# Patient Record
Sex: Female | Born: 1960
Health system: Southern US, Community
[De-identification: ages and names within clinical notes are randomized; demographics above are authoritative.]

## PROBLEM LIST (undated history)

## (undated) DIAGNOSIS — S129XXA Fracture of neck, unspecified, initial encounter: Secondary | ICD-10-CM

## (undated) DIAGNOSIS — G709 Myoneural disorder, unspecified: Secondary | ICD-10-CM

## (undated) DIAGNOSIS — B009 Herpesviral infection, unspecified: Secondary | ICD-10-CM

## (undated) DIAGNOSIS — R55 Syncope and collapse: Secondary | ICD-10-CM

## (undated) DIAGNOSIS — Z8701 Personal history of pneumonia (recurrent): Secondary | ICD-10-CM

## (undated) DIAGNOSIS — F419 Anxiety disorder, unspecified: Secondary | ICD-10-CM

## (undated) DIAGNOSIS — IMO0001 Reserved for inherently not codable concepts without codable children: Secondary | ICD-10-CM

## (undated) DIAGNOSIS — R413 Other amnesia: Secondary | ICD-10-CM

## (undated) DIAGNOSIS — E039 Hypothyroidism, unspecified: Secondary | ICD-10-CM

## (undated) DIAGNOSIS — M1990 Unspecified osteoarthritis, unspecified site: Secondary | ICD-10-CM

## (undated) DIAGNOSIS — M5030 Other cervical disc degeneration, unspecified cervical region: Secondary | ICD-10-CM

## (undated) DIAGNOSIS — T8859XA Other complications of anesthesia, initial encounter: Secondary | ICD-10-CM

## (undated) DIAGNOSIS — F329 Major depressive disorder, single episode, unspecified: Secondary | ICD-10-CM

## (undated) DIAGNOSIS — R296 Repeated falls: Secondary | ICD-10-CM

## (undated) DIAGNOSIS — Z9889 Other specified postprocedural states: Secondary | ICD-10-CM

## (undated) DIAGNOSIS — E785 Hyperlipidemia, unspecified: Secondary | ICD-10-CM

## (undated) DIAGNOSIS — T884XXA Failed or difficult intubation, initial encounter: Secondary | ICD-10-CM

## (undated) DIAGNOSIS — R112 Nausea with vomiting, unspecified: Secondary | ICD-10-CM

## (undated) DIAGNOSIS — K219 Gastro-esophageal reflux disease without esophagitis: Secondary | ICD-10-CM

## (undated) DIAGNOSIS — M5481 Occipital neuralgia: Secondary | ICD-10-CM

## (undated) DIAGNOSIS — I10 Essential (primary) hypertension: Secondary | ICD-10-CM

## (undated) DIAGNOSIS — R0600 Dyspnea, unspecified: Secondary | ICD-10-CM

## (undated) DIAGNOSIS — Q761 Klippel-Feil syndrome: Secondary | ICD-10-CM

## (undated) DIAGNOSIS — M75120 Complete rotator cuff tear or rupture of unspecified shoulder, not specified as traumatic: Secondary | ICD-10-CM

## (undated) DIAGNOSIS — F32A Depression, unspecified: Secondary | ICD-10-CM

## (undated) DIAGNOSIS — J939 Pneumothorax, unspecified: Secondary | ICD-10-CM

## (undated) DIAGNOSIS — J45909 Unspecified asthma, uncomplicated: Secondary | ICD-10-CM

## (undated) DIAGNOSIS — J111 Influenza due to unidentified influenza virus with other respiratory manifestations: Secondary | ICD-10-CM

## (undated) DIAGNOSIS — G930 Cerebral cysts: Secondary | ICD-10-CM

## (undated) DIAGNOSIS — R51 Headache: Secondary | ICD-10-CM

## (undated) DIAGNOSIS — M19019 Primary osteoarthritis, unspecified shoulder: Secondary | ICD-10-CM

## (undated) DIAGNOSIS — Z87442 Personal history of urinary calculi: Secondary | ICD-10-CM

## (undated) DIAGNOSIS — R2689 Other abnormalities of gait and mobility: Secondary | ICD-10-CM

## (undated) DIAGNOSIS — R519 Headache, unspecified: Secondary | ICD-10-CM

## (undated) DIAGNOSIS — G5603 Carpal tunnel syndrome, bilateral upper limbs: Secondary | ICD-10-CM

## (undated) DIAGNOSIS — R350 Frequency of micturition: Secondary | ICD-10-CM

## (undated) HISTORY — DX: Gastro-esophageal reflux disease without esophagitis: K21.9

## (undated) HISTORY — PX: FOREIGN BODY REMOVAL: SHX962

## (undated) HISTORY — PX: COLONOSCOPY: SHX174

## (undated) HISTORY — PX: ABDOMINAL HYSTERECTOMY: SHX81

## (undated) HISTORY — PX: ROTATOR CUFF REPAIR: SHX139

## (undated) HISTORY — PX: KNEE ARTHROSCOPY: SUR90

## (undated) HISTORY — PX: REDUCTION MAMMAPLASTY: SUR839

---

## 2001-02-28 ENCOUNTER — Encounter: Payer: Self-pay | Admitting: Internal Medicine

## 2001-02-28 ENCOUNTER — Ambulatory Visit (HOSPITAL_COMMUNITY): Admission: RE | Admit: 2001-02-28 | Discharge: 2001-02-28 | Payer: Self-pay | Admitting: Internal Medicine

## 2001-07-02 ENCOUNTER — Encounter: Payer: Self-pay | Admitting: Emergency Medicine

## 2001-07-02 ENCOUNTER — Emergency Department (HOSPITAL_COMMUNITY): Admission: EM | Admit: 2001-07-02 | Discharge: 2001-07-02 | Payer: Self-pay | Admitting: Emergency Medicine

## 2001-11-13 ENCOUNTER — Encounter: Payer: Self-pay | Admitting: Family Medicine

## 2001-11-13 ENCOUNTER — Ambulatory Visit (HOSPITAL_COMMUNITY): Admission: RE | Admit: 2001-11-13 | Discharge: 2001-11-13 | Payer: Self-pay | Admitting: Family Medicine

## 2001-12-11 ENCOUNTER — Encounter: Payer: Self-pay | Admitting: Family Medicine

## 2001-12-11 ENCOUNTER — Ambulatory Visit (HOSPITAL_COMMUNITY): Admission: RE | Admit: 2001-12-11 | Discharge: 2001-12-11 | Payer: Self-pay | Admitting: Family Medicine

## 2001-12-16 ENCOUNTER — Encounter: Payer: Self-pay | Admitting: Internal Medicine

## 2001-12-16 ENCOUNTER — Ambulatory Visit (HOSPITAL_COMMUNITY): Admission: RE | Admit: 2001-12-16 | Discharge: 2001-12-16 | Payer: Self-pay | Admitting: Internal Medicine

## 2003-05-13 ENCOUNTER — Encounter: Payer: Self-pay | Admitting: *Deleted

## 2003-05-13 ENCOUNTER — Emergency Department (HOSPITAL_COMMUNITY): Admission: EM | Admit: 2003-05-13 | Discharge: 2003-05-13 | Payer: Self-pay | Admitting: *Deleted

## 2003-05-24 ENCOUNTER — Encounter: Payer: Self-pay | Admitting: Internal Medicine

## 2003-05-24 ENCOUNTER — Ambulatory Visit (HOSPITAL_COMMUNITY): Admission: RE | Admit: 2003-05-24 | Discharge: 2003-05-24 | Payer: Self-pay | Admitting: Internal Medicine

## 2003-05-28 ENCOUNTER — Ambulatory Visit (HOSPITAL_COMMUNITY): Admission: RE | Admit: 2003-05-28 | Discharge: 2003-05-28 | Payer: Self-pay | Admitting: Internal Medicine

## 2003-06-08 ENCOUNTER — Ambulatory Visit (HOSPITAL_COMMUNITY): Admission: RE | Admit: 2003-06-08 | Discharge: 2003-06-08 | Payer: Self-pay | Admitting: Internal Medicine

## 2003-10-07 ENCOUNTER — Ambulatory Visit (HOSPITAL_COMMUNITY): Admission: RE | Admit: 2003-10-07 | Discharge: 2003-10-07 | Payer: Self-pay | Admitting: Internal Medicine

## 2004-02-06 ENCOUNTER — Emergency Department (HOSPITAL_COMMUNITY): Admission: EM | Admit: 2004-02-06 | Discharge: 2004-02-06 | Payer: Self-pay | Admitting: Emergency Medicine

## 2004-05-03 IMAGING — CT CT HEAD W/O CM
1 series · 16 of 26 positions shown, 20 images · non-contrast
Comparison: none

FINDINGS
CLINICAL DATA: SYNCOPE, HYPERTENSION.
CT HEAD WITHOUT CONTRAST
COMPARISON TO 07/02/01.
NO EVIDENCE OF ACUTE INTRACRANIAL ABNORMALITY.  NO MASS OR MASS EFFECT, HYDROCEPHALUS, EXTRA-AXIAL
FLUID COLLECTION, MIDLINE SHIFT, HEMORRHAGE, INFRACT.  FOCAL AREA OF DECREASED ATTENUATION IN THE
REGION OF THE LEFT BASAL GANGLIA MAY REPRESENT ENLARGED PERIVASCULAR SPACE/CYST.  BONY CALVARIUM
AND VISUALIZED PARANASAL SINUSES GROSSLY UNREMARKABLE.
IMPRESSION
NO EVIDENCE OF ACUTE INTRACRANIAL ABNORMALITY.

[Series 5333: — · axial · 0.40mm/px · z∈[-670,-555]mm · 16 of 26 slices shown, 20 images]
[im 2/26  brain]
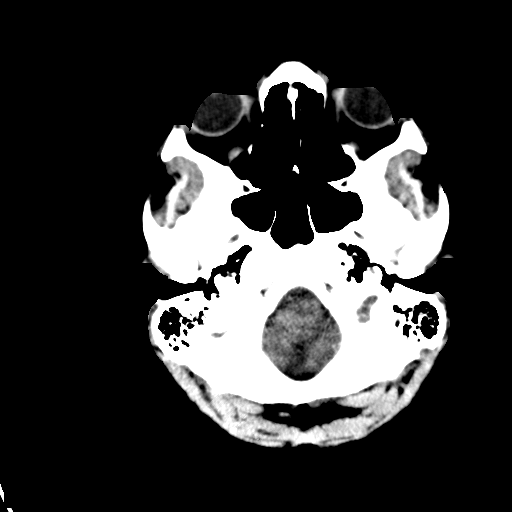
[im 2/26  bone]
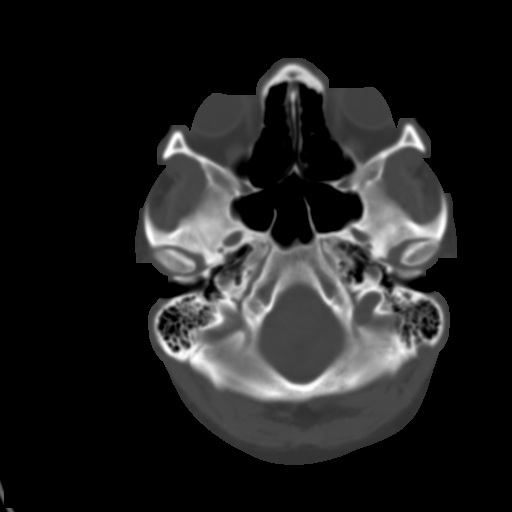
[im 4/26  brain]
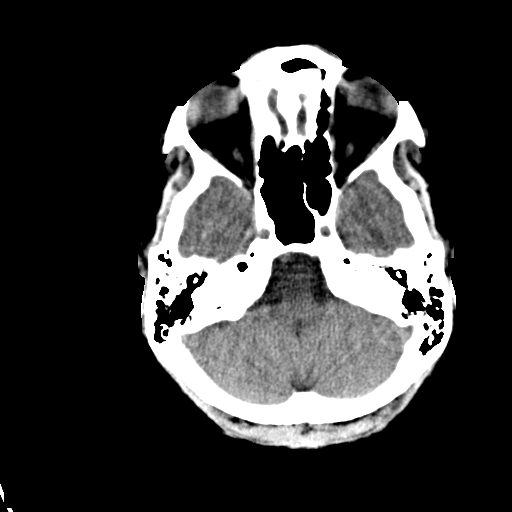
[im 5/26  brain]
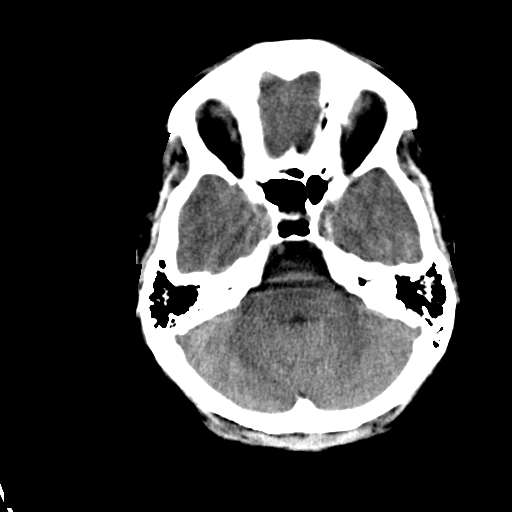
[im 7/26  brain]
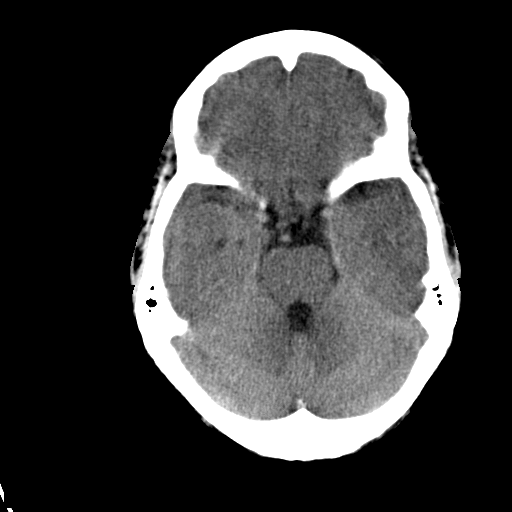
[im 8/26  brain]
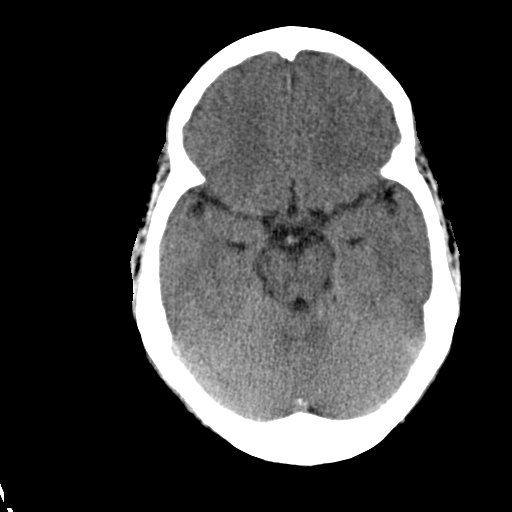
[im 8/26  bone]
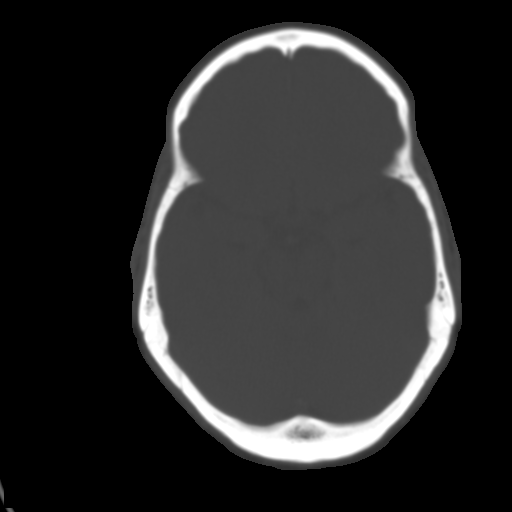
[im 10/26  brain]
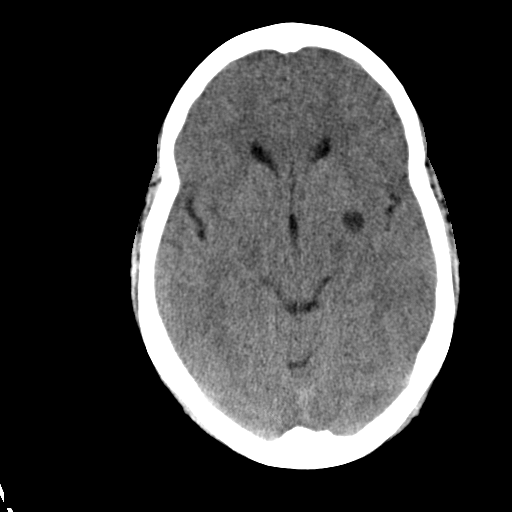
[im 11/26  brain]
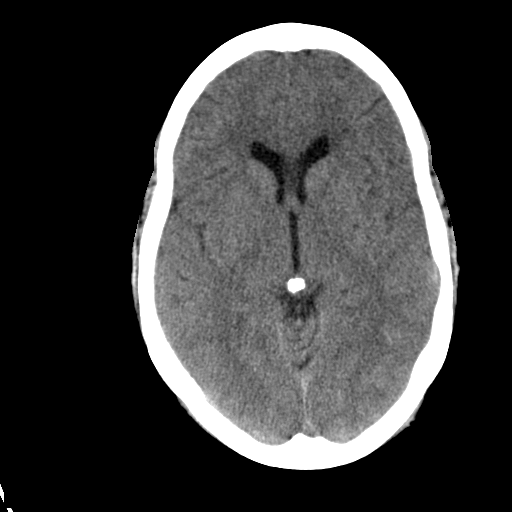
[im 13/26  brain]
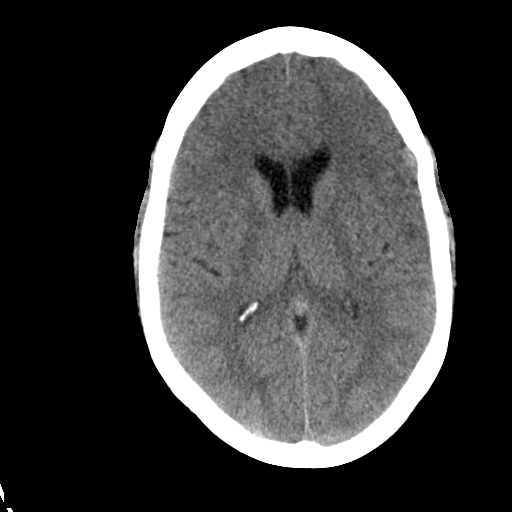
[im 14/26  brain]
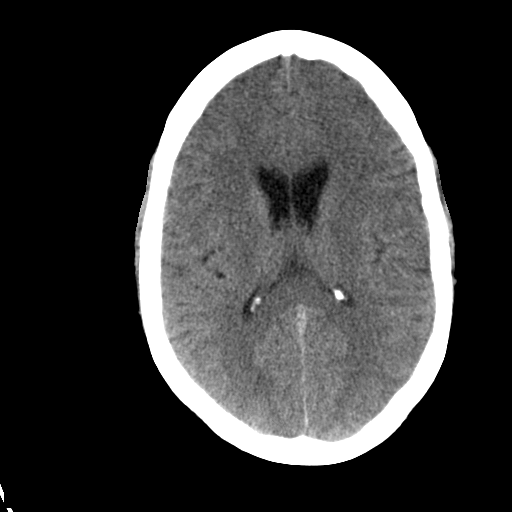
[im 14/26  bone]
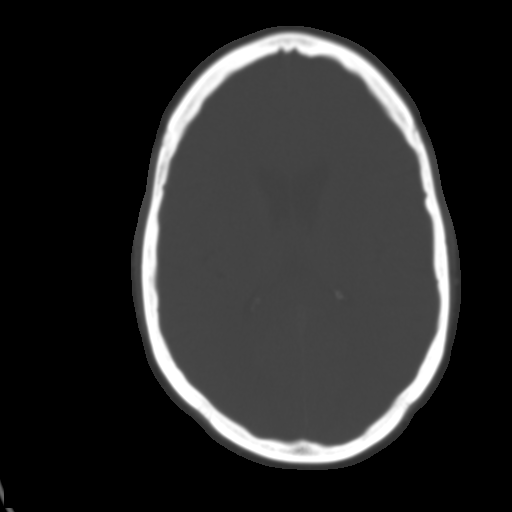
[im 16/26  brain]
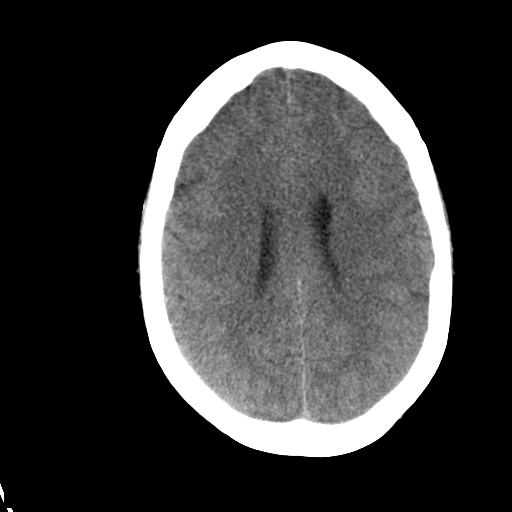
[im 17/26  brain]
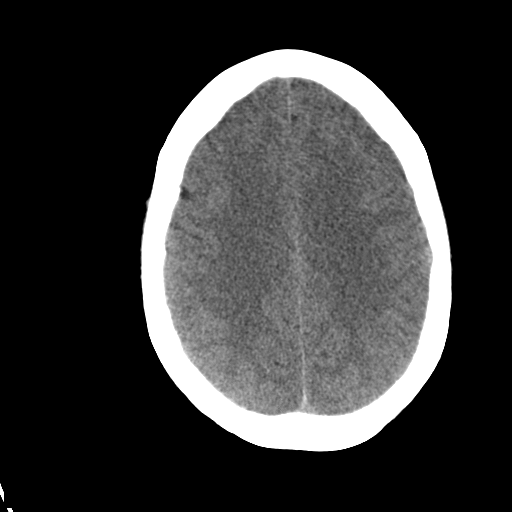
[im 19/26  brain]
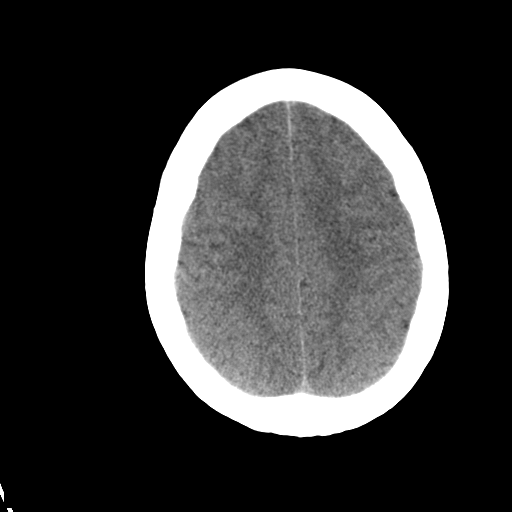
[im 20/26  brain]
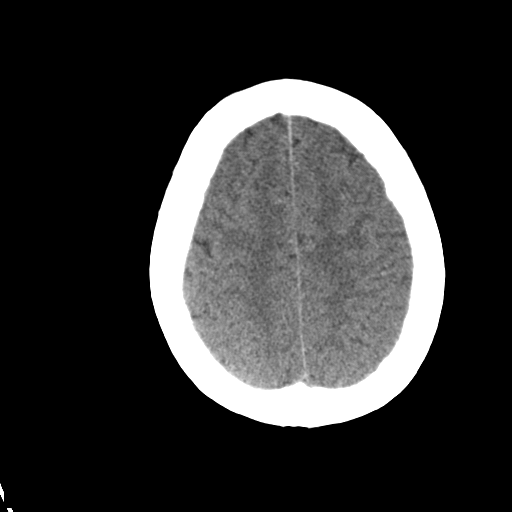
[im 20/26  bone]
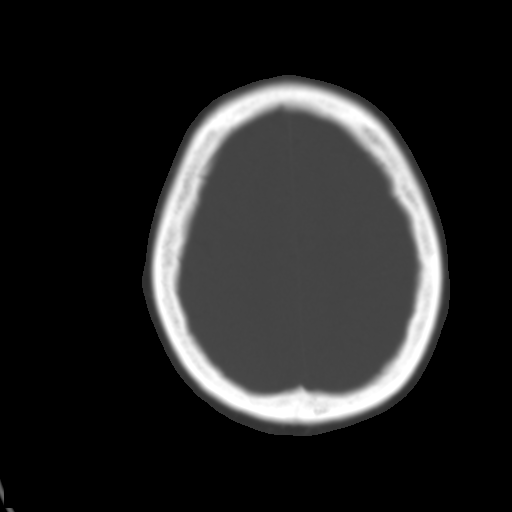
[im 22/26  brain]
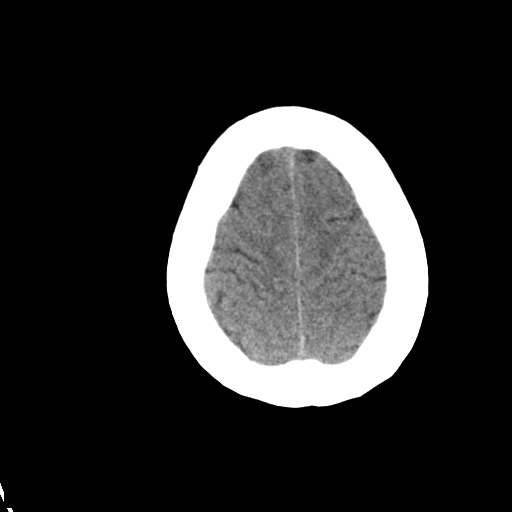
[im 23/26  brain]
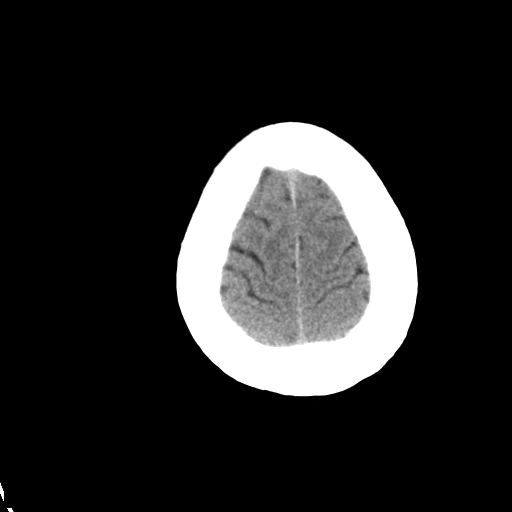
[im 25/26  brain]
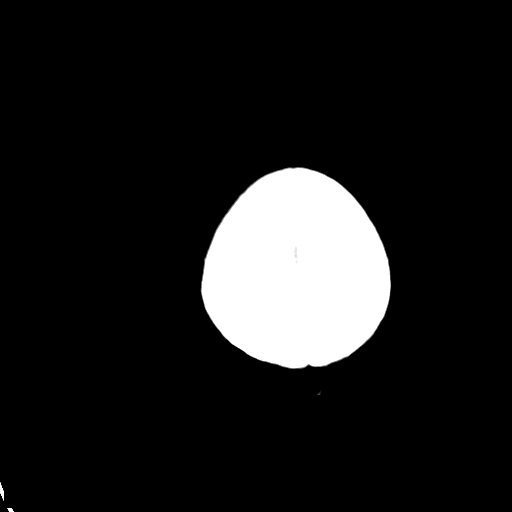

[16 of 26 positions shown; findings below may reference images not displayed]

## 2005-04-30 ENCOUNTER — Emergency Department (HOSPITAL_COMMUNITY): Admission: EM | Admit: 2005-04-30 | Discharge: 2005-04-30 | Payer: Self-pay | Admitting: Emergency Medicine

## 2005-05-20 ENCOUNTER — Emergency Department (HOSPITAL_COMMUNITY): Admission: EM | Admit: 2005-05-20 | Discharge: 2005-05-20 | Payer: Self-pay | Admitting: Emergency Medicine

## 2005-10-13 IMAGING — US US PELVIS COMPLETE MODIFY
1 series · 12 of 12 positions shown · non-contrast
Comparison: none

CLINICAL DATA: Lt ovarian cyst seen on recent CT dated 05/23/01.
COMPLETE PELVIC AND TRANSVAGINAL ULTRASOUND
Uterus is not visualized consistent with patient?s hysterectomy.  The right ovary is not visualized.  There is a 4.4 x 2.2 x 2.7 cm mildly complex cyst with single mildly thickened septation, in the left adnexal region.  No definite mural nodularity with this cyst is noted.  No free fluid.  
IMPRESSION
1.  4.4 x 2.2 x 2.7 cm mildly complex left  dnexal cyst with single mildly thickened septation.  Consider interval follow-up in 6-8 weeks to assess change.
2.  Uterus and right ovary not visualized.

[Series 1: unknown · 0.32mm/px · 12 of 12 slices shown]
[im 1/12]
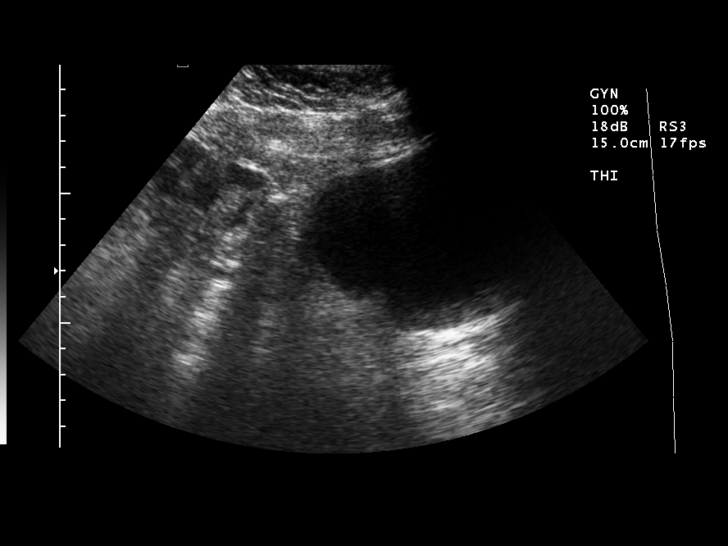
[im 2/12]
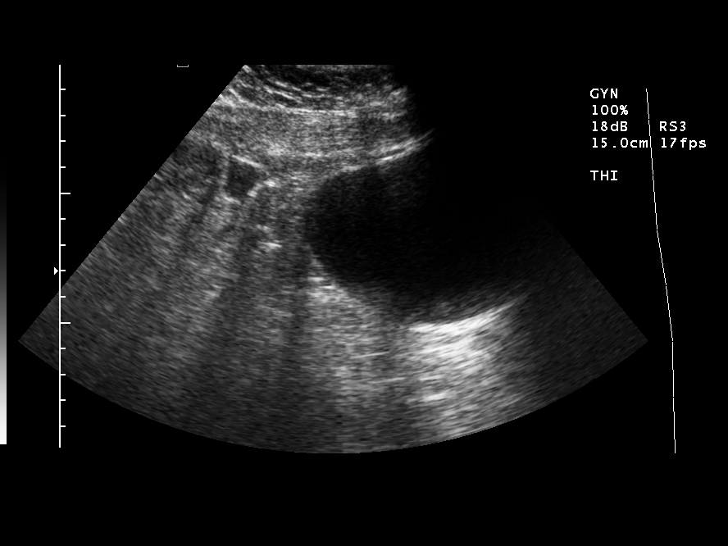
[im 3/12]
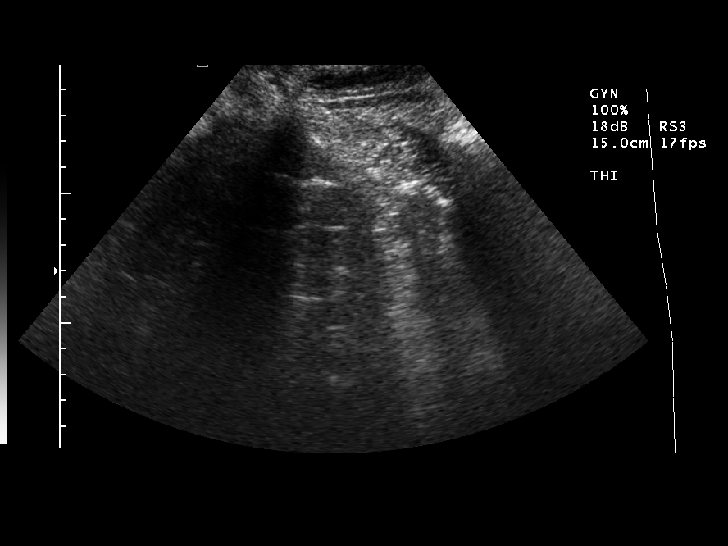
[im 4/12]
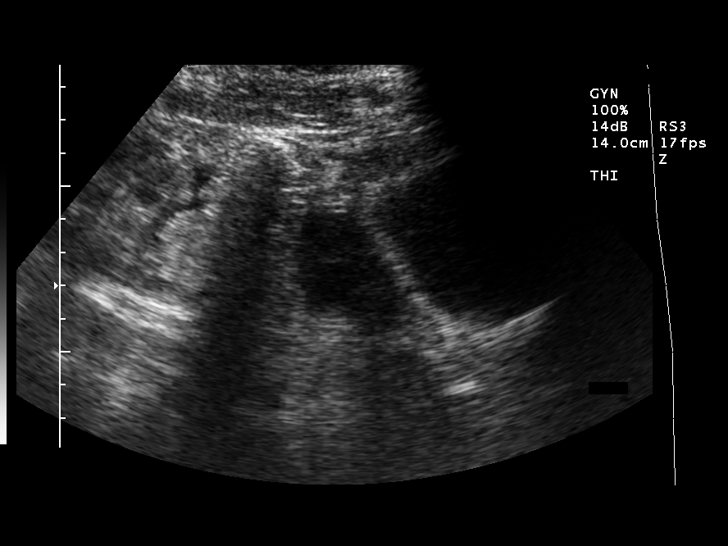
[im 5/12]
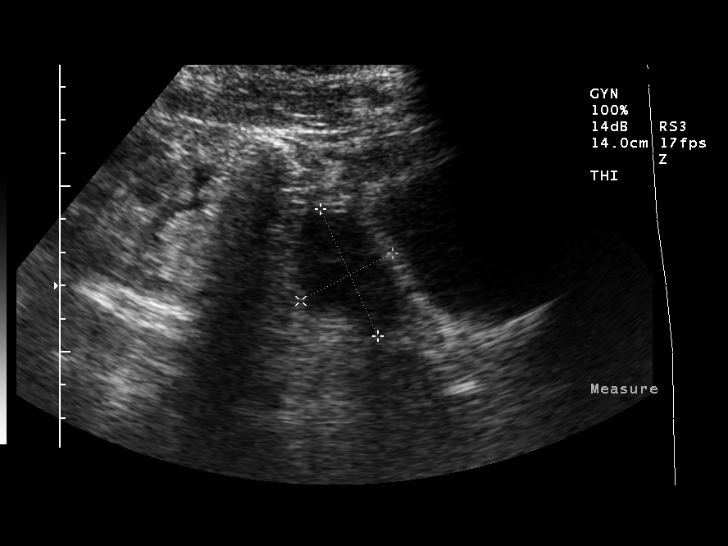
[im 6/12]
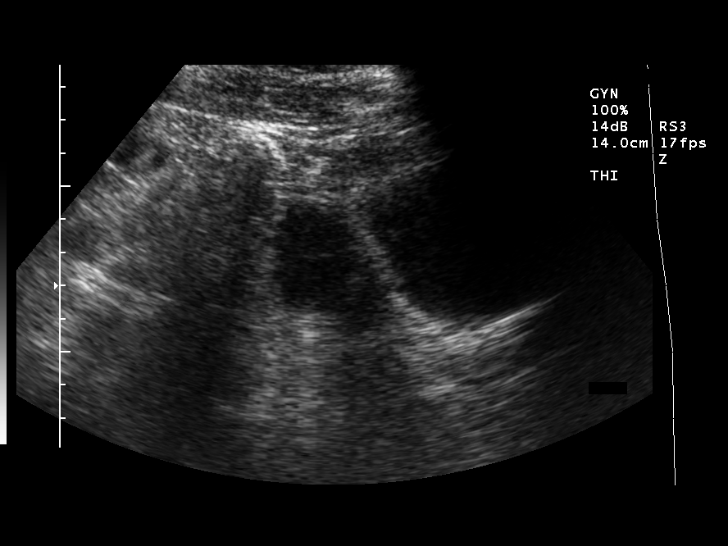
[im 7/12]
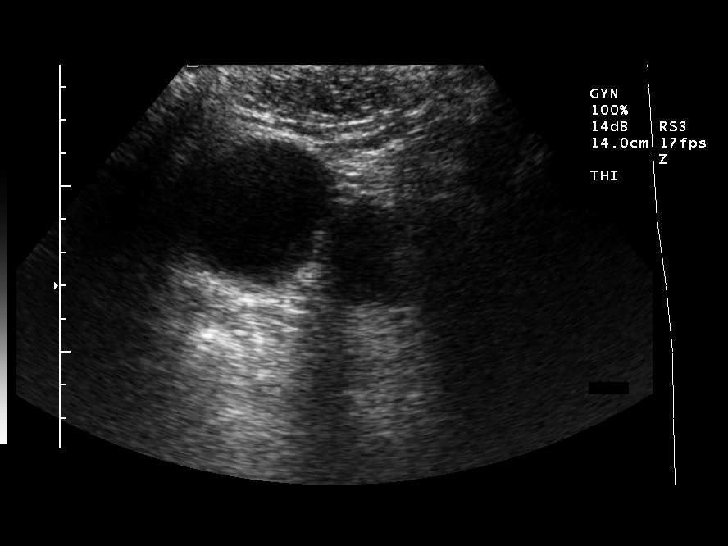
[im 8/12]
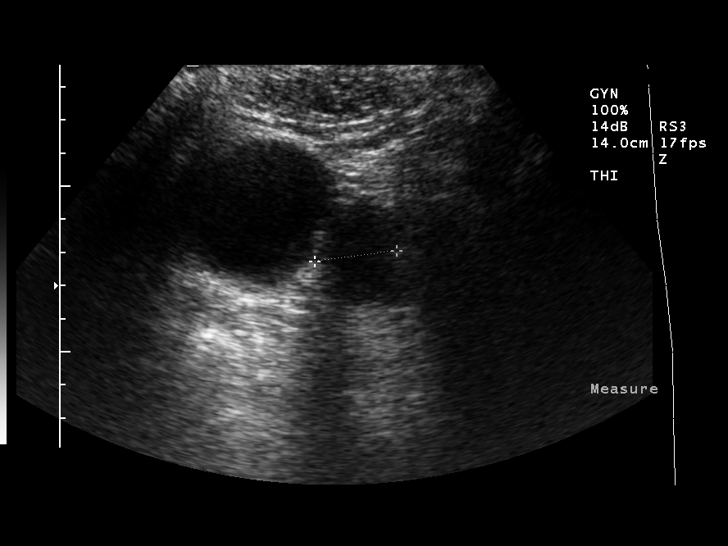
[im 9/12]
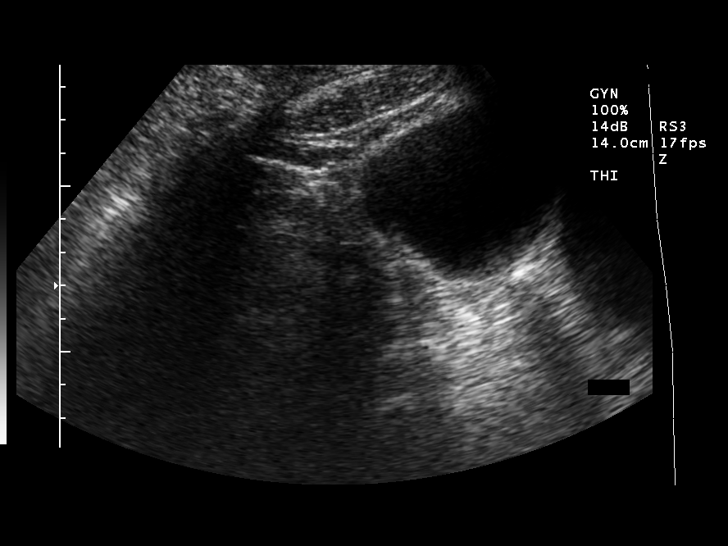
[im 10/12]
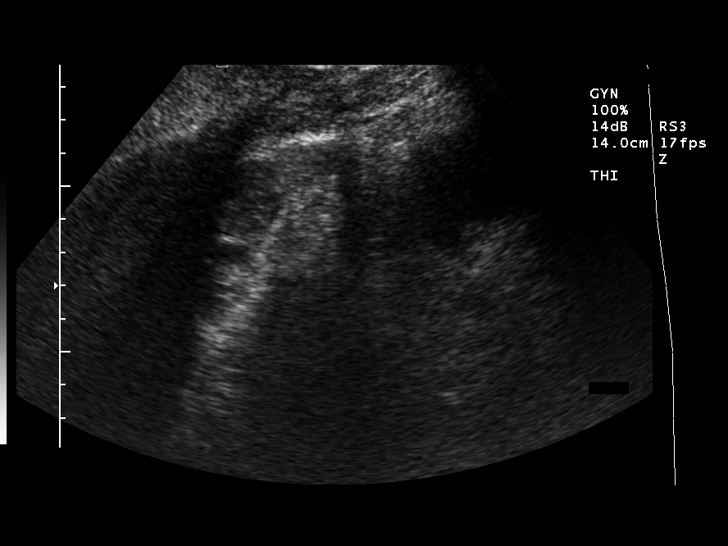
[im 11/12]
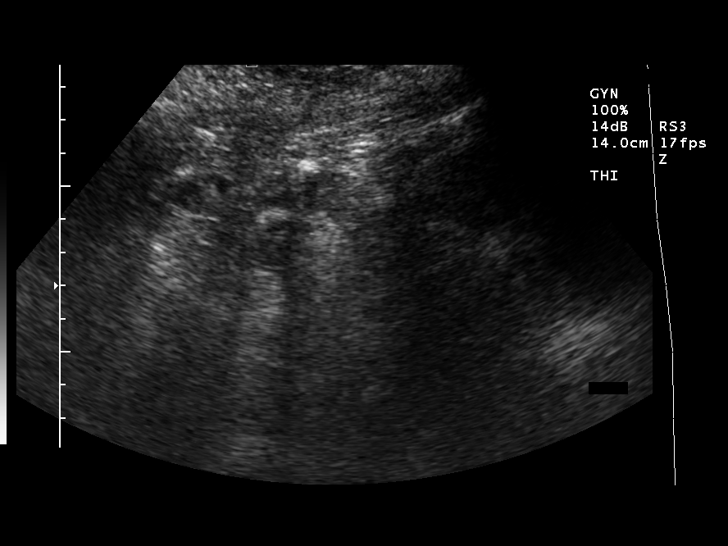
[im 12/12]
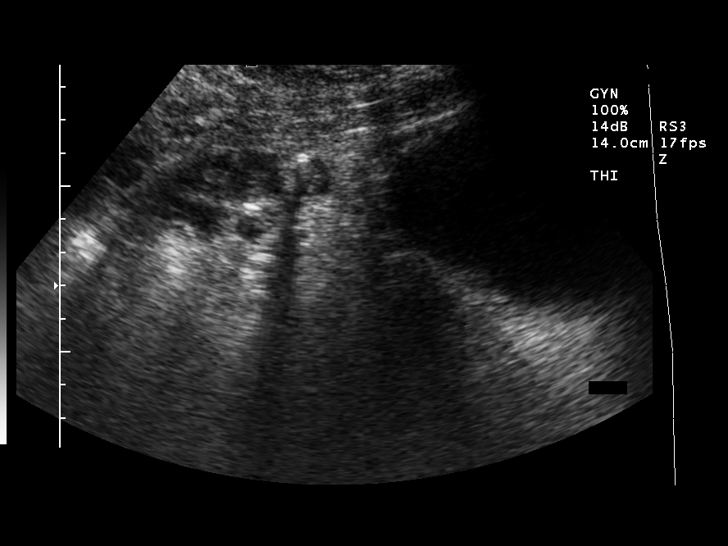

[12 of 12 positions shown; findings below may reference images not displayed]

## 2005-10-24 IMAGING — NM NM RENAL IMAGING FLOW W/ PHARM
1 series · 6 of 6 positions shown · non-contrast
Comparison: none

CLINICAL DATA: History of hypertension.
 CAPTOPRIL RENAL STUDY
 A Captopril renal study was performed after the intravenous administration of 10.0 mCi Tc-ZZB MAG 3.  The patient was premedicated with 50 mg of Captopril orally 1 hour prior to the start of the study.  A renal dynamic study was performed and carried [DATE] minutes.  Quantification of the differential uptake function, calculation of the GFR, and images at different intervals was also performed.  Comparison between the clearance of the tracer and the parameter of renal function after Captopril was performed.
 There was normal flow, uptake and excretion on the left side with peaking time being 4.5 minutes and T-[DATE] of 6.5 minutes.  The flow was normal on the right side with peaking time being borderline of 5.8 minutes and T-[DATE] of 6.0 minutes.  The calculated total GFR was 323 ml/minute.  The left kidney contributes 51% and the right kidney 49% to the total renal function.  
 IMPRESSION
 There was normal flow, uptake and excretion on the left side.  There was normal flow and normal excretion with borderline peaking time on the right side; however, this is probably related to the retaining of the radiopharmaceutical in the dilated collecting systems.  I do not see any definite evidence of renal vascular hypertension.  The left kidney contributes 51% and the right kidney 49% to the total renal function.

[renal · 4.30mm/px · 6 of 180 frames shown]
[frame 16/180]
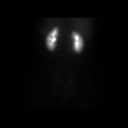
[frame 46/180]
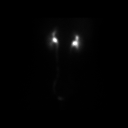
[frame 76/180]
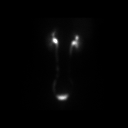
[frame 106/180]
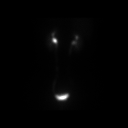
[frame 136/180]
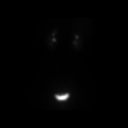
[frame 166/180]
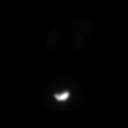

[6 of 6 positions shown; findings below may reference images not displayed]

## 2006-01-07 ENCOUNTER — Ambulatory Visit: Payer: Self-pay | Admitting: Orthopedic Surgery

## 2006-01-21 ENCOUNTER — Ambulatory Visit: Payer: Self-pay | Admitting: Orthopedic Surgery

## 2006-01-29 ENCOUNTER — Observation Stay (HOSPITAL_COMMUNITY): Admission: RE | Admit: 2006-01-29 | Discharge: 2006-01-30 | Payer: Self-pay | Admitting: Orthopedic Surgery

## 2006-01-29 ENCOUNTER — Ambulatory Visit: Payer: Self-pay | Admitting: Orthopedic Surgery

## 2006-02-01 ENCOUNTER — Encounter (HOSPITAL_COMMUNITY): Admission: RE | Admit: 2006-02-01 | Discharge: 2006-03-03 | Payer: Self-pay | Admitting: Orthopedic Surgery

## 2006-02-13 ENCOUNTER — Ambulatory Visit: Payer: Self-pay | Admitting: Orthopedic Surgery

## 2006-02-22 IMAGING — US US PELVIS COMPLETE MODIFY
1 series · 14 of 25 positions shown · non-contrast
Comparison: 05/28/03.

CLINICAL DATA: 42-year-old with ovarian cyst.

[Series 1: unknown · 0.32mm/px · 14 of 49 slices shown]
[im 1/49]
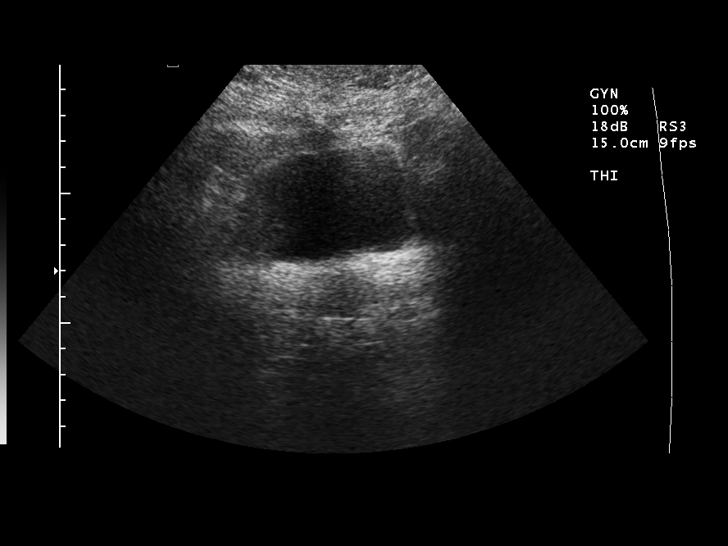
[im 5/49]
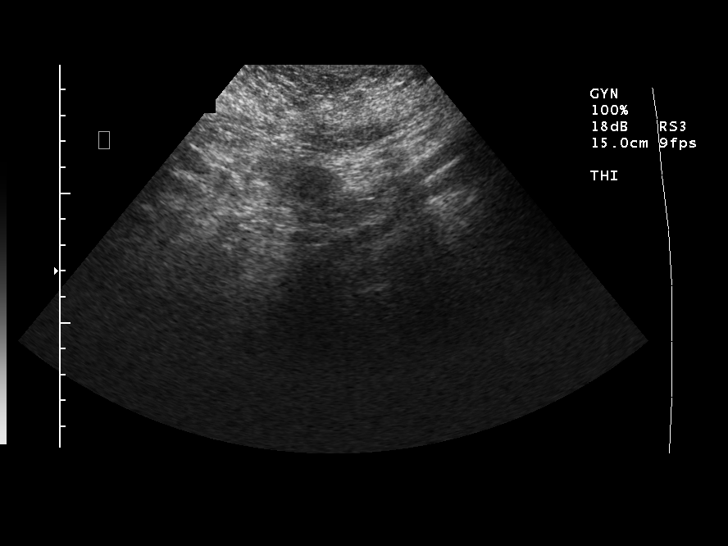
[im 9/49]
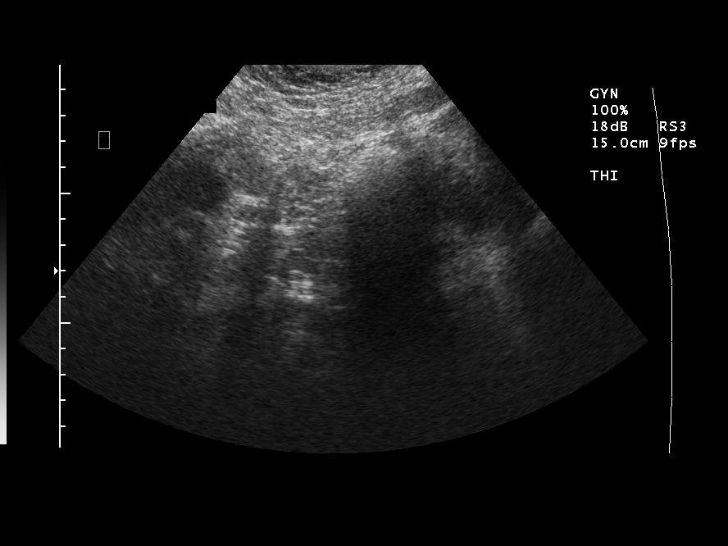
[im 13/49]
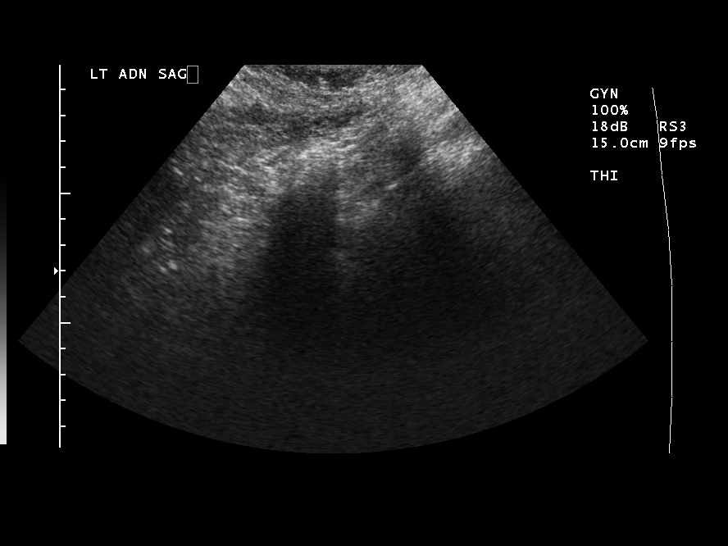
[im 17/49]
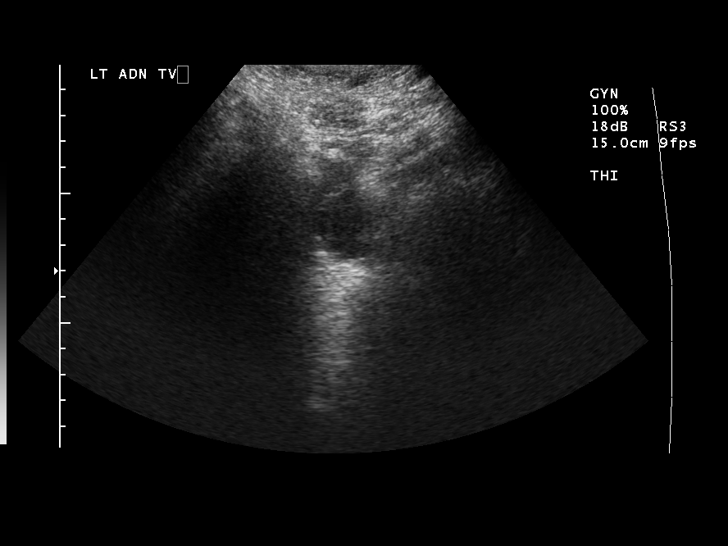
[im 19/49]
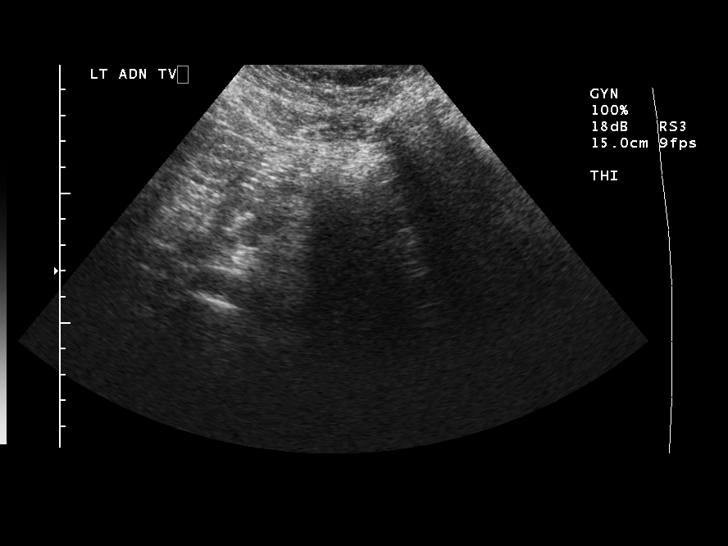
[im 23/49]
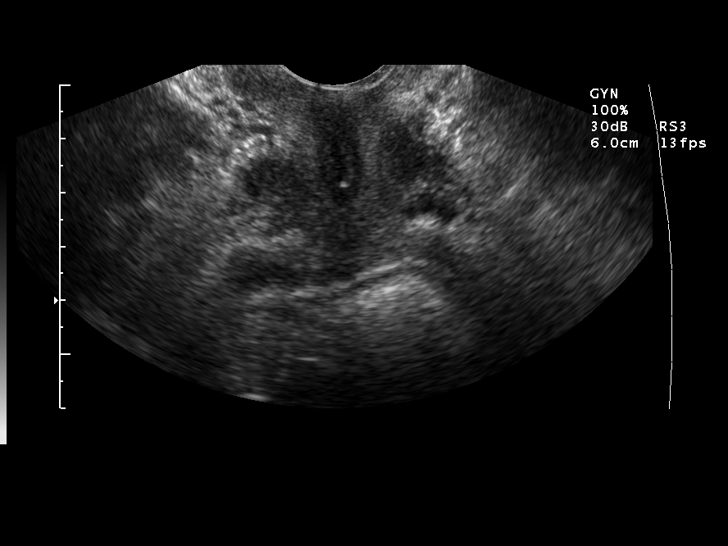
[im 27/49]
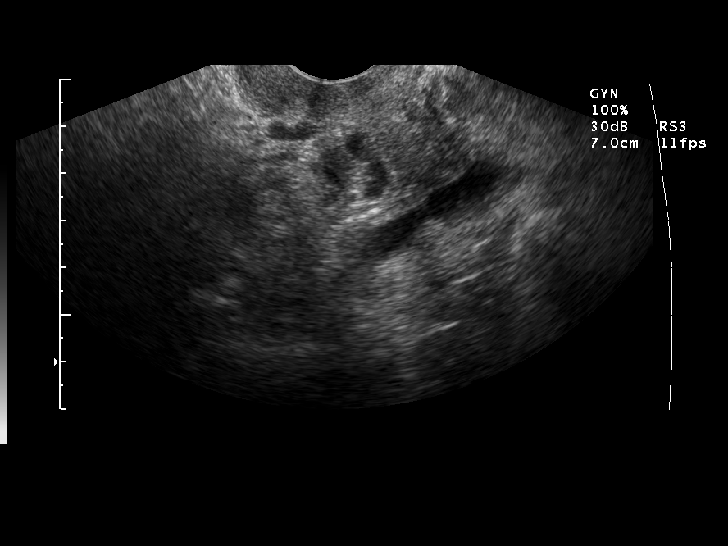
[im 31/49]
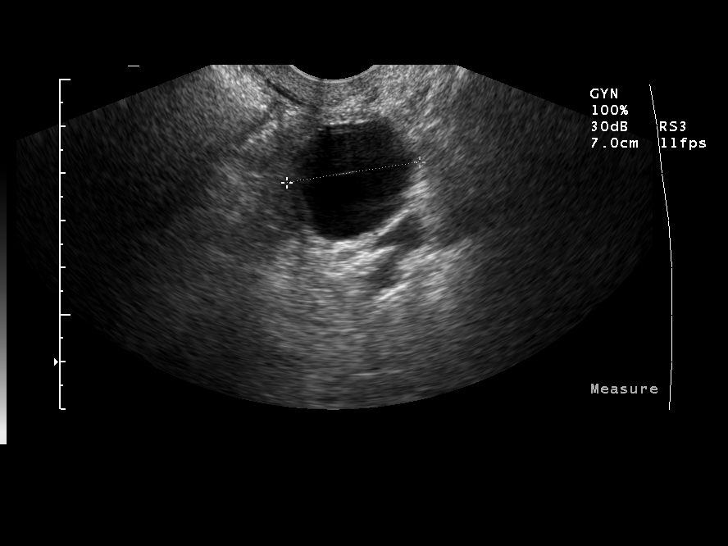
[im 33/49]
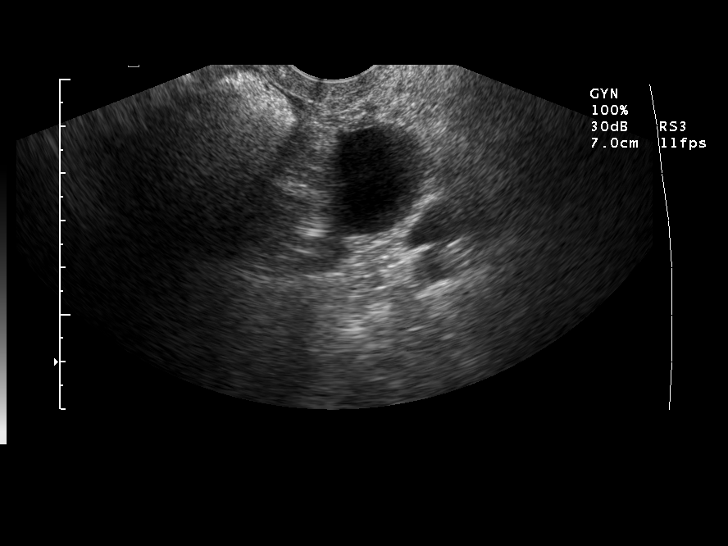
[im 37/49]
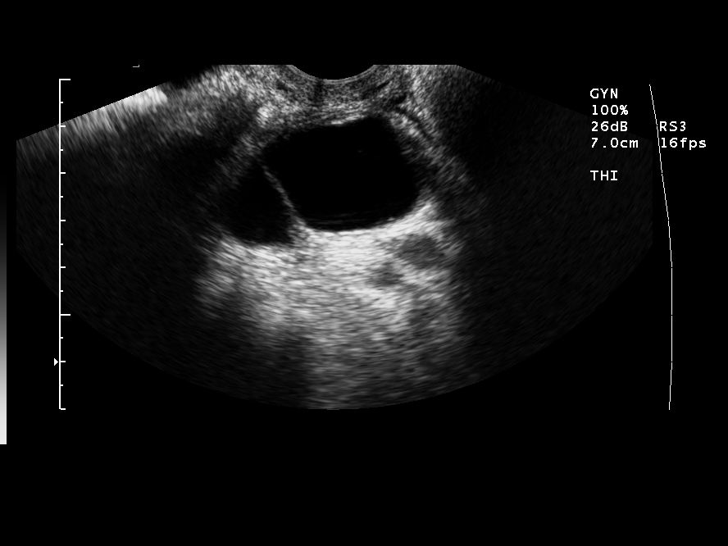
[im 41/49]
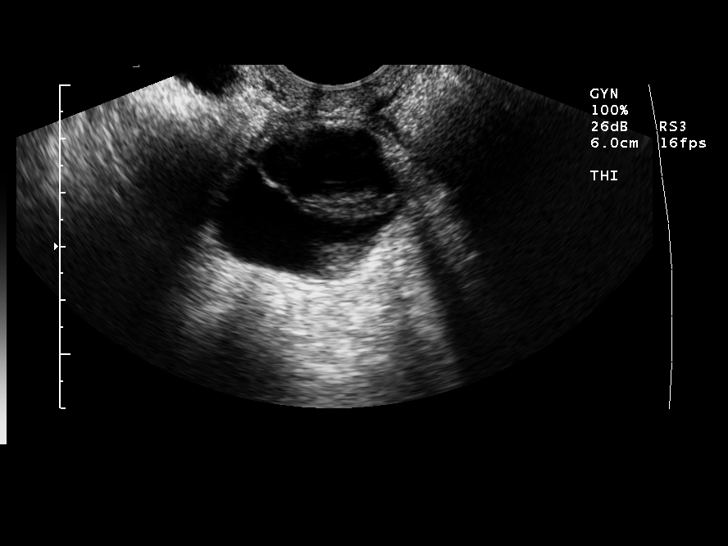
[im 45/49]
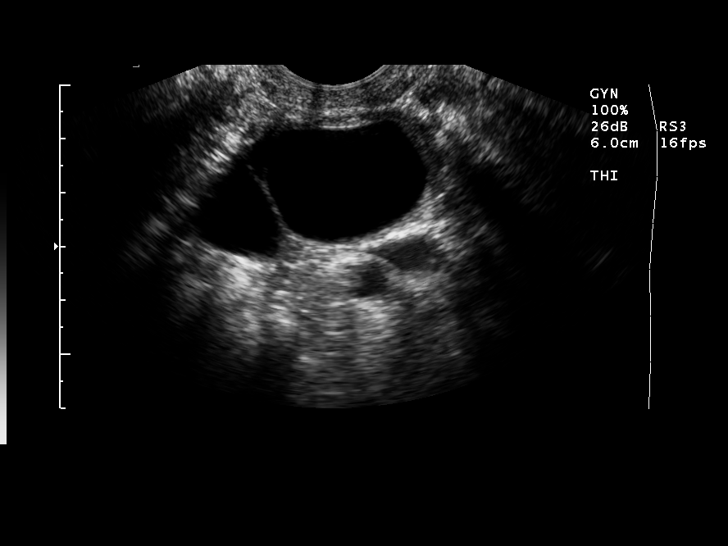
[im 49/49]
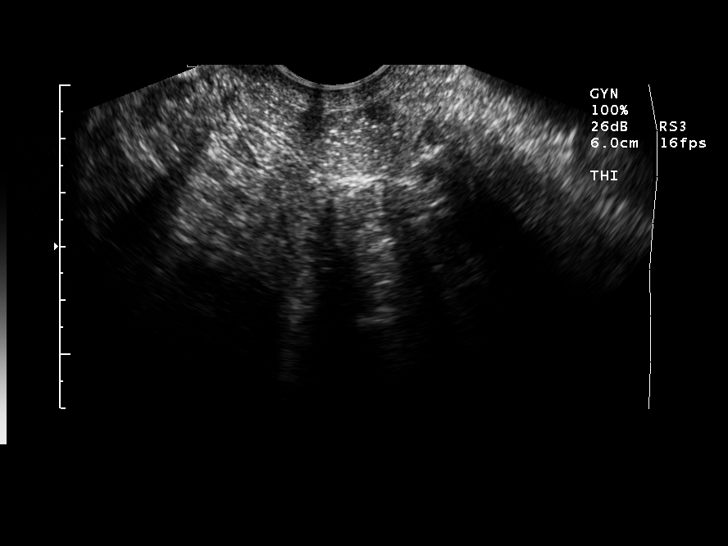

[14 of 25 positions shown; findings below may reference images not displayed]

US PELVIS COMPLETE/US TRANSVAGINAL
 Transabdominal and endovaginal images are performed of the pelvis.  Comparison 05/28/03.  The uterus is absent.  The right ovary has also been removed.  Within the left ovary, there is a cystic structure containing a single mildly thickened septation and some debris.  The appearance is unchanged when compared to prior exam.  The components of this cyst measure 3.4 x 2.4 x 2.6 cm and 1.4 x 1.6 x 1.7 cm.  There is no free pelvic fluid.  
 IMPRESSION
 1.  Surgically absent uterus and right ovary.
 2.  Stable appearance of left ovarian cystic lesion.  Further follow-up may be helpful.

## 2006-02-25 ENCOUNTER — Encounter: Payer: Self-pay | Admitting: Orthopedic Surgery

## 2006-03-04 ENCOUNTER — Encounter (HOSPITAL_COMMUNITY): Admission: RE | Admit: 2006-03-04 | Discharge: 2006-04-03 | Payer: Self-pay | Admitting: Orthopedic Surgery

## 2006-03-13 ENCOUNTER — Ambulatory Visit: Payer: Self-pay | Admitting: Orthopedic Surgery

## 2006-04-02 ENCOUNTER — Emergency Department (HOSPITAL_COMMUNITY): Admission: EM | Admit: 2006-04-02 | Discharge: 2006-04-02 | Payer: Self-pay | Admitting: Emergency Medicine

## 2006-04-04 ENCOUNTER — Encounter (HOSPITAL_COMMUNITY): Admission: RE | Admit: 2006-04-04 | Discharge: 2006-04-27 | Payer: Self-pay | Admitting: Orthopedic Surgery

## 2006-04-24 ENCOUNTER — Ambulatory Visit: Payer: Self-pay | Admitting: Orthopedic Surgery

## 2006-04-29 ENCOUNTER — Encounter (HOSPITAL_COMMUNITY): Admission: RE | Admit: 2006-04-29 | Discharge: 2006-05-29 | Payer: Self-pay | Admitting: Orthopedic Surgery

## 2006-05-30 ENCOUNTER — Encounter (HOSPITAL_COMMUNITY): Admission: RE | Admit: 2006-05-30 | Discharge: 2006-06-29 | Payer: Self-pay | Admitting: Orthopedic Surgery

## 2006-06-19 ENCOUNTER — Ambulatory Visit: Payer: Self-pay | Admitting: Orthopedic Surgery

## 2007-10-03 DIAGNOSIS — I10 Essential (primary) hypertension: Secondary | ICD-10-CM | POA: Insufficient documentation

## 2007-10-06 ENCOUNTER — Ambulatory Visit: Payer: Self-pay | Admitting: Orthopedic Surgery

## 2007-10-06 DIAGNOSIS — M25519 Pain in unspecified shoulder: Secondary | ICD-10-CM

## 2007-10-06 DIAGNOSIS — M7580 Other shoulder lesions, unspecified shoulder: Secondary | ICD-10-CM

## 2007-10-06 DIAGNOSIS — M75120 Complete rotator cuff tear or rupture of unspecified shoulder, not specified as traumatic: Secondary | ICD-10-CM
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
## 2007-10-07 ENCOUNTER — Telehealth: Payer: Self-pay | Admitting: Orthopedic Surgery

## 2007-10-07 ENCOUNTER — Encounter: Payer: Self-pay | Admitting: Orthopedic Surgery

## 2007-10-08 ENCOUNTER — Encounter (INDEPENDENT_AMBULATORY_CARE_PROVIDER_SITE_OTHER): Payer: Self-pay | Admitting: *Deleted

## 2008-05-18 ENCOUNTER — Ambulatory Visit: Payer: Self-pay | Admitting: Family Medicine

## 2008-05-28 ENCOUNTER — Emergency Department: Payer: Self-pay | Admitting: Emergency Medicine

## 2008-06-02 ENCOUNTER — Emergency Department: Payer: Self-pay | Admitting: Emergency Medicine

## 2008-06-07 ENCOUNTER — Ambulatory Visit (HOSPITAL_COMMUNITY): Admission: RE | Admit: 2008-06-07 | Discharge: 2008-06-07 | Payer: Self-pay | Admitting: Family Medicine

## 2008-08-18 IMAGING — CR DG CHEST 2V
2 series · 2 of 2 positions shown · non-contrast
Comparison: none

HISTORY: Chest pain

CHEST 2 VIEWS:
No prior study for comparison.
Normal heart size, mediastinal contours, and vascularity.
Lungs clear.
No effusion or pneumothorax.
Bones unremarkable.

[view not recorded (1 of 2)]
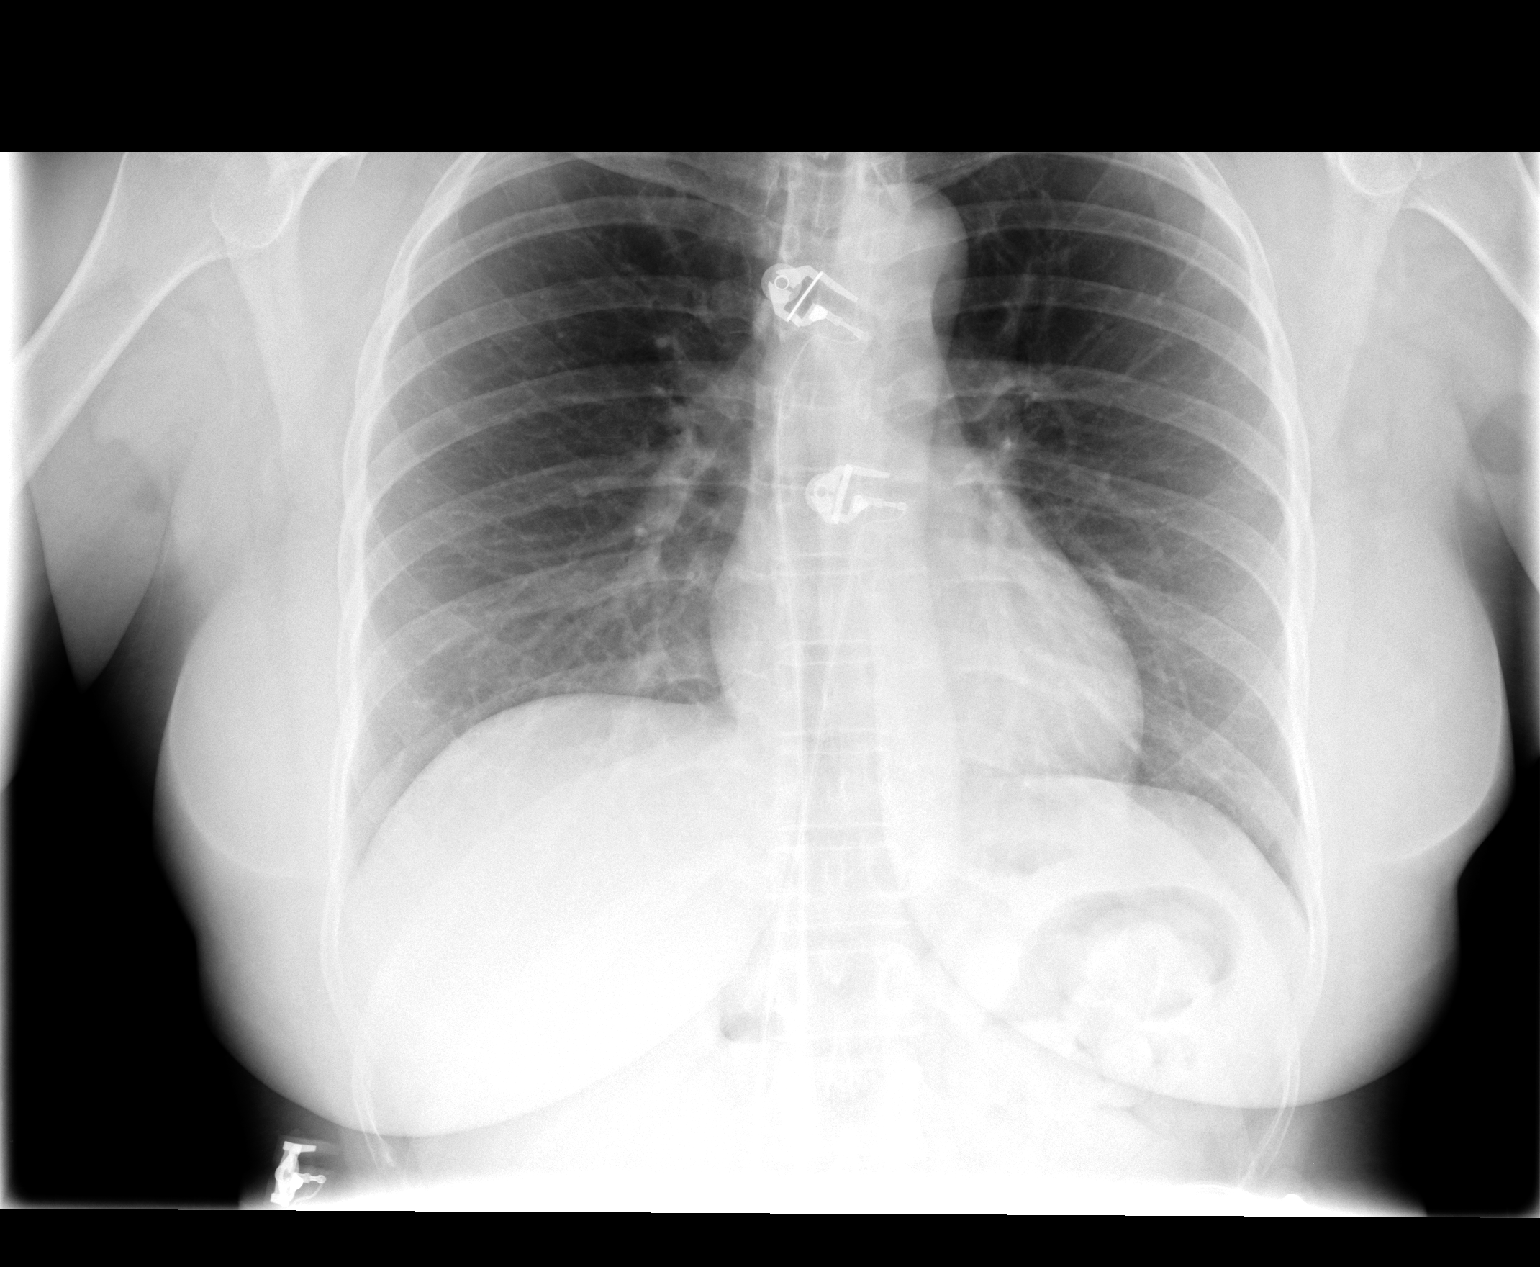

[view not recorded (2 of 2)]
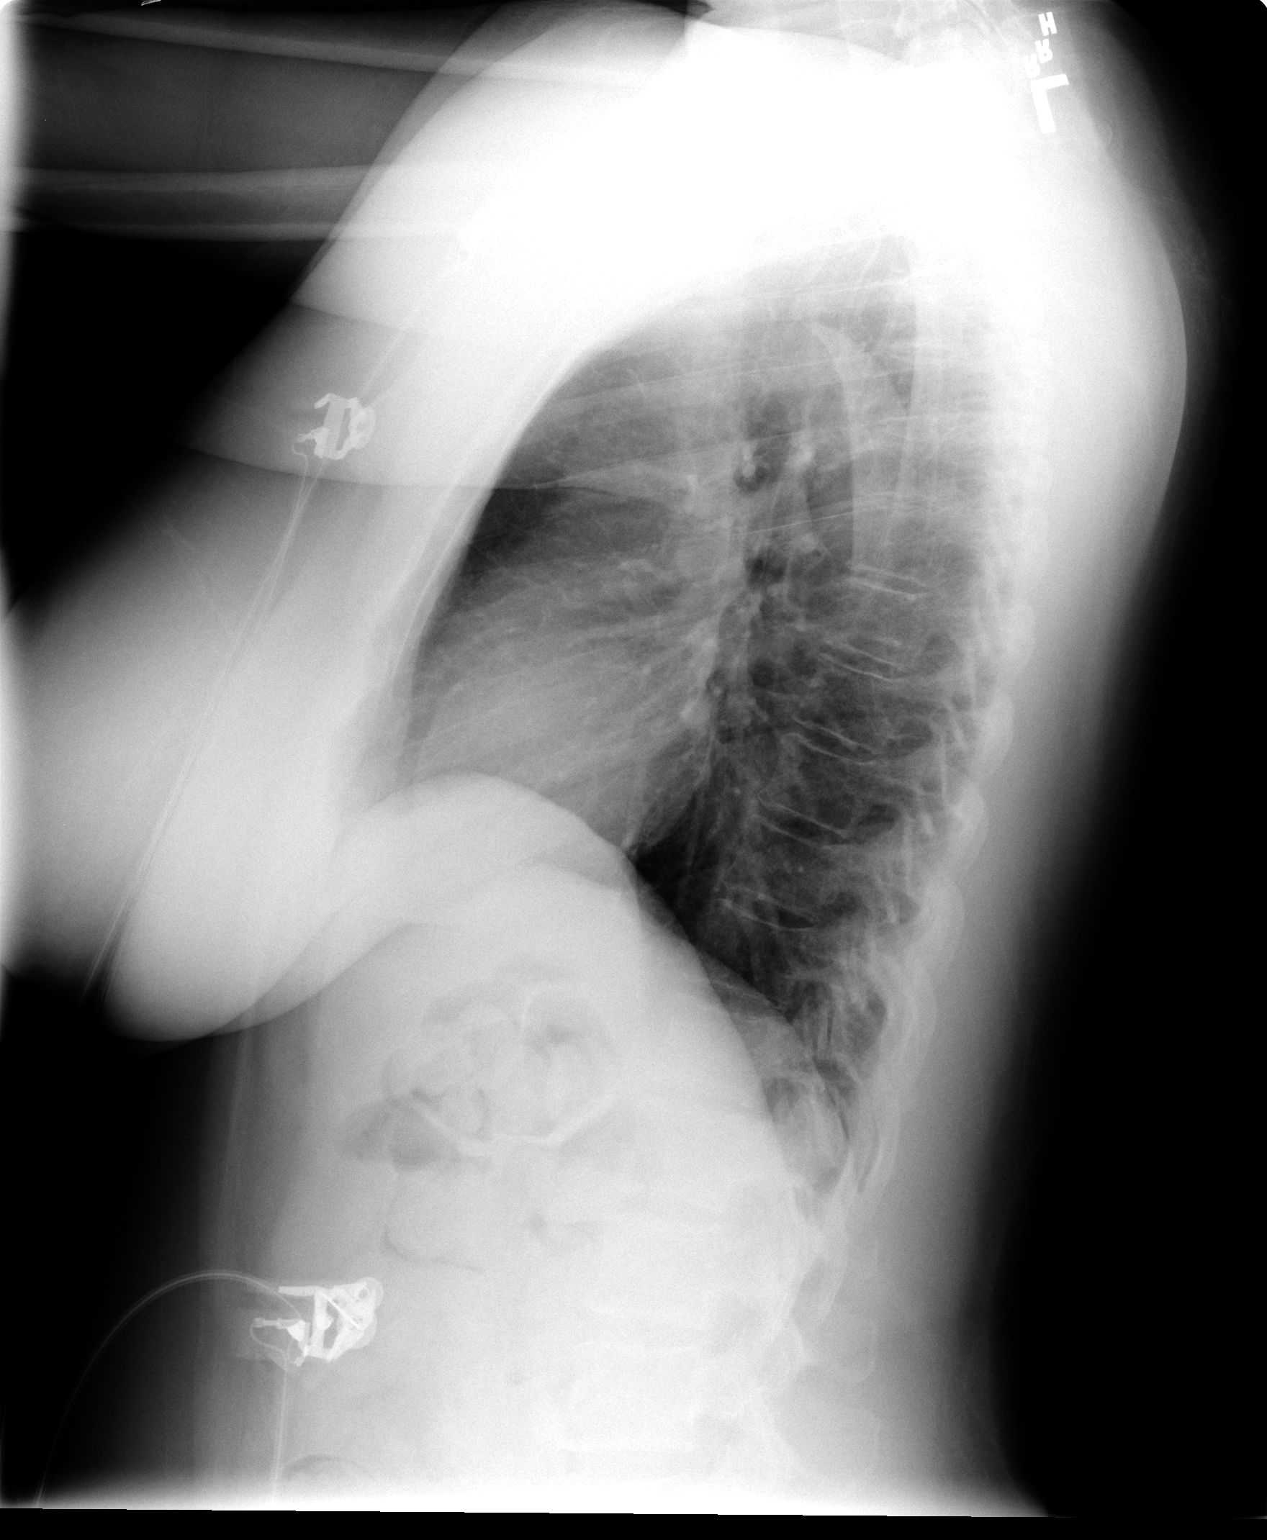

[2 of 2 positions shown; findings below may reference images not displayed]

IMPRESSION: No acute abnormalities.

## 2008-08-18 IMAGING — CT CT ANGIO CHEST
2 of 4 series · 19 of 36 positions shown · IV contrast (CONTRAST)
Comparison: none

CLINICAL DATA: Chest wall pain.
CT ANGIOGRAPHY OF CHEST FOR PULMONAY EMBOLUS:
TECHNIQUE: Multidetector CT imaging of the chest was performed during bolus injection of intravenous contrast.  Multiplanar CT angiographic image reconstructions were generated to evaluate the vascular anatomy.
Contrast:  792cc Omnipaque 300 contrast.

[Series 1623: — · axial · 0.60mm/px · z∈[+1605,+1819]mm · 16 of 393 slices shown (1 of 2)]
[im 18/393  lung]
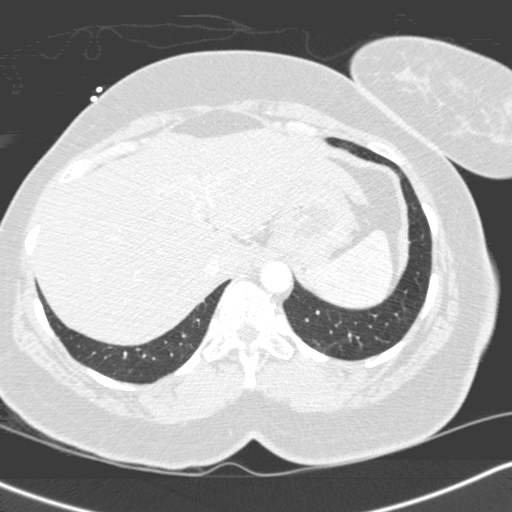
[im 36/393  mediastinal]
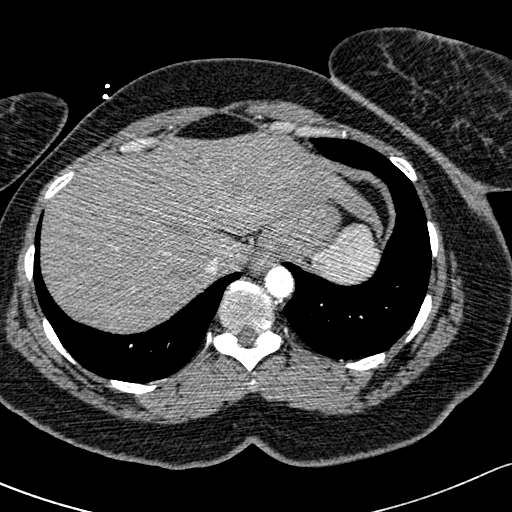
[im 72/393  lung]
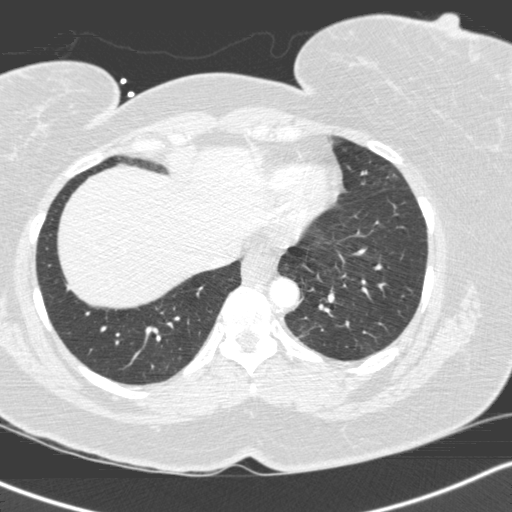
[im 90/393  mediastinal]
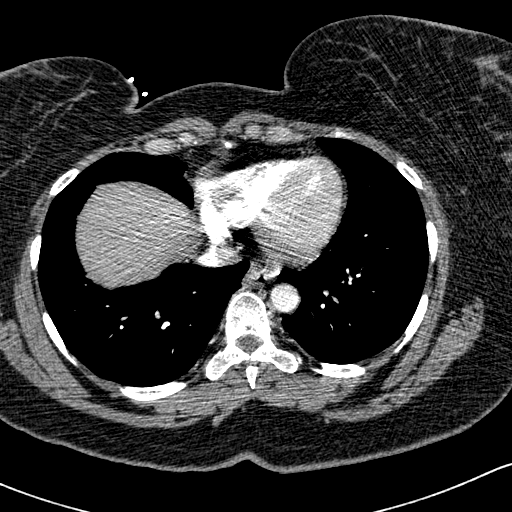
[im 107/393  lung]
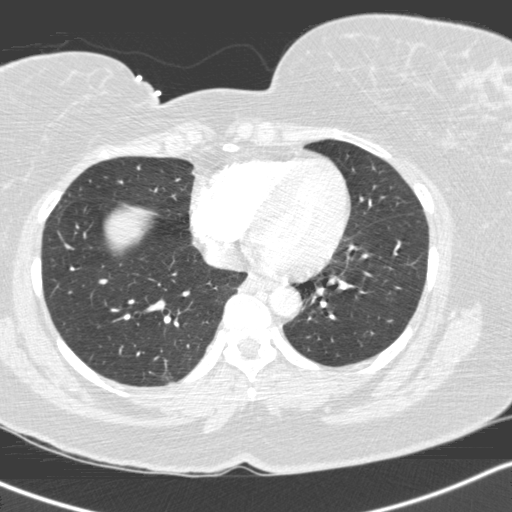
[im 143/393  mediastinal]
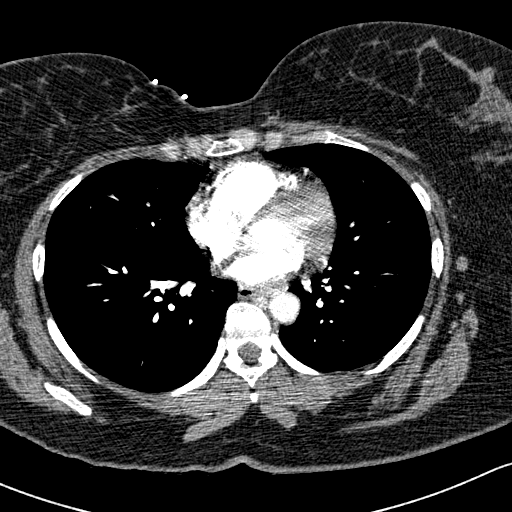
[im 161/393  lung]
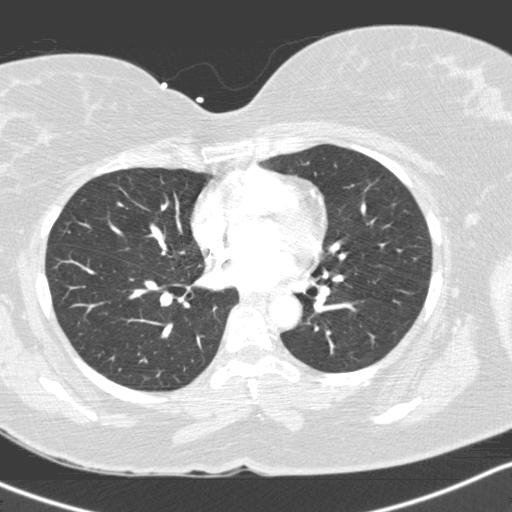
[im 179/393  mediastinal]
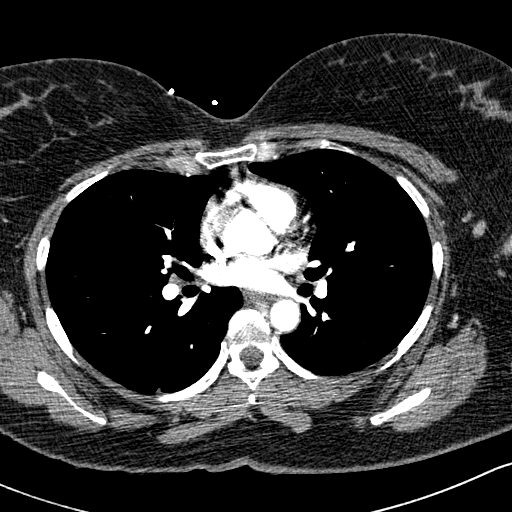
[im 214/393  lung]
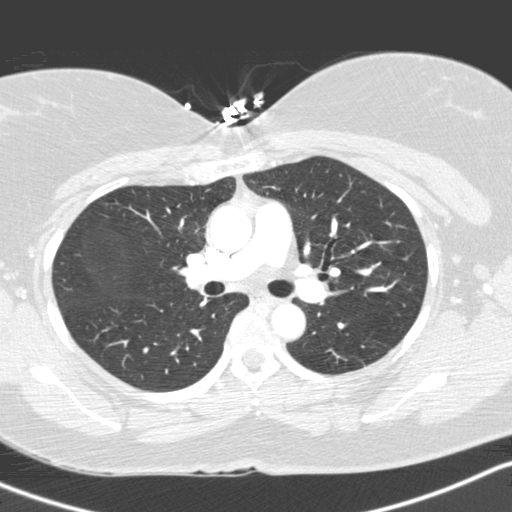
[im 232/393  mediastinal]
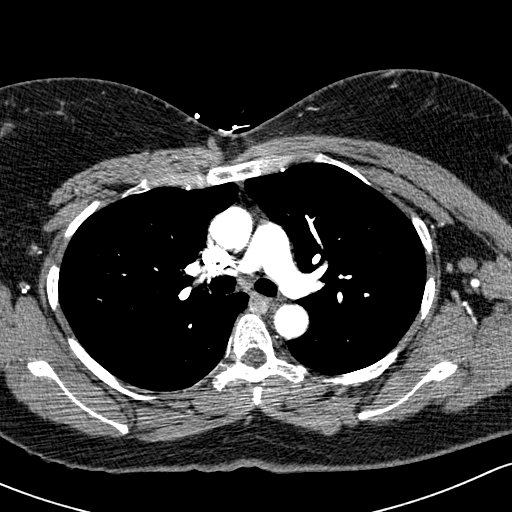
[im 250/393  lung]
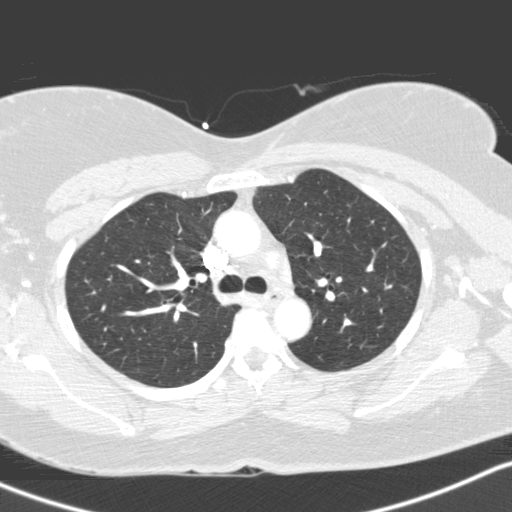
[im 286/393  mediastinal]
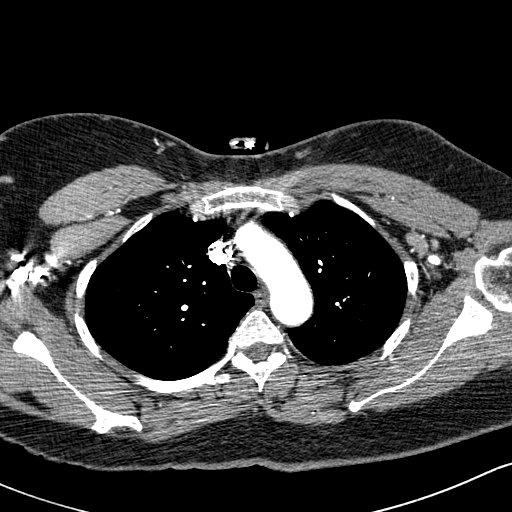
[im 303/393  lung]
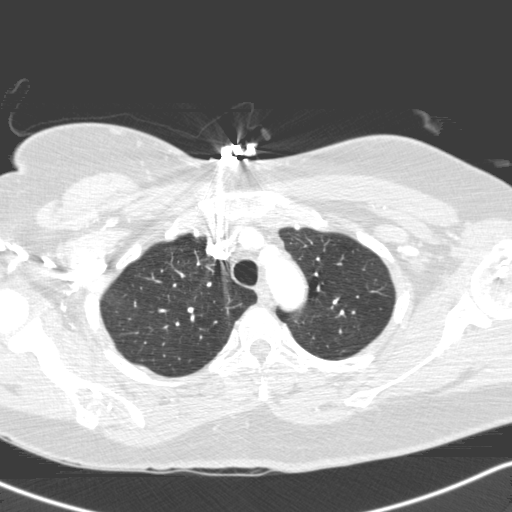
[im 321/393  mediastinal]
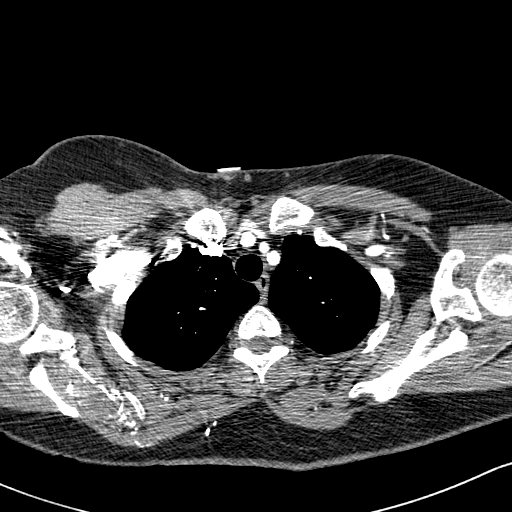
[im 357/393  lung]
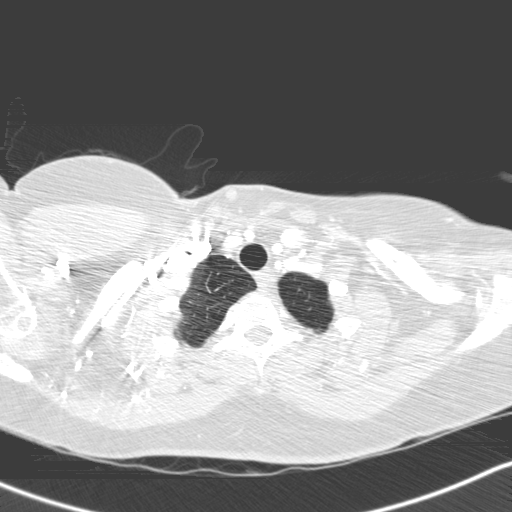
[im 375/393  mediastinal]
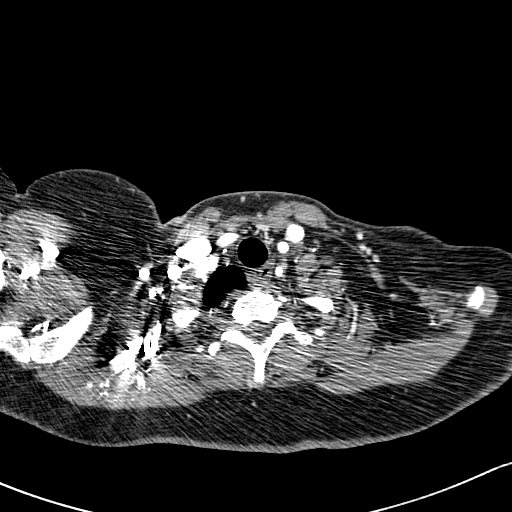

[— · coronal · 0.55mm/px · 3 of 70 slices shown (2 of 2)]
[im 14/70  mediastinal]
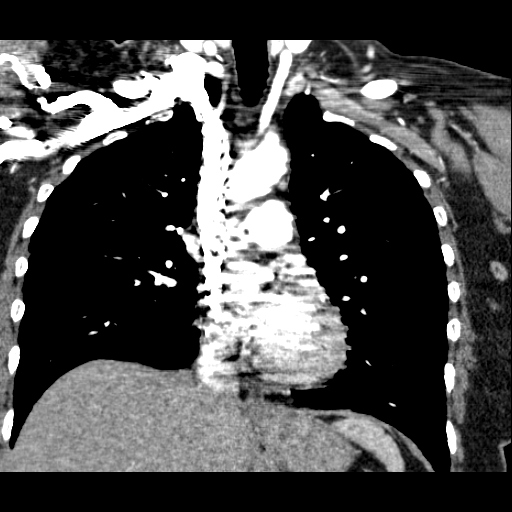
[im 28/70  mediastinal]
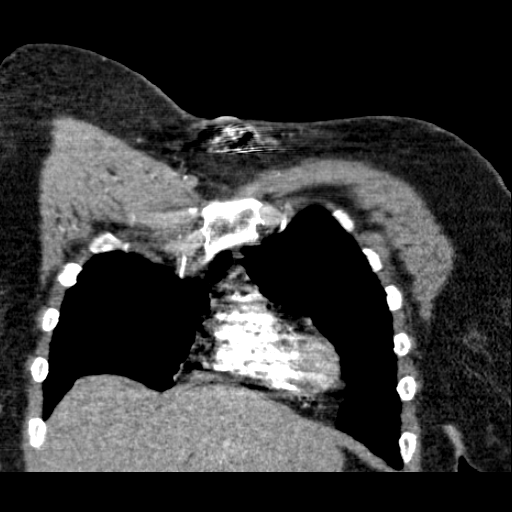
[im 42/70  mediastinal]
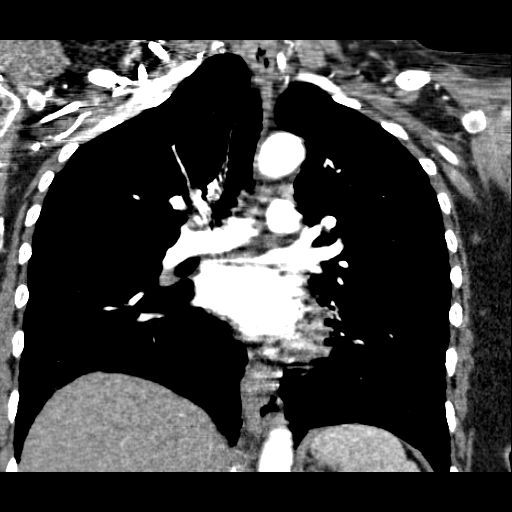

[19 of 36 positions shown; findings below may reference images not displayed]

FINDINGS: There is no CT evidence of pulmonary embolus.  No hilar, axillary, or mediastinal lymphadenopathy.  There is no pleural or pericardial effusion.  Heart size is normal.  mall hiatal hernia is noted.  Upper abdomen is otherwise unremarkable.  There is no focal bony abnormality.
IMPRESSION: 1. Negative for pulmonary embolus or other acute abnormality.
2. Small hiatal hernia.

## 2008-11-29 ENCOUNTER — Ambulatory Visit (HOSPITAL_COMMUNITY): Admission: RE | Admit: 2008-11-29 | Discharge: 2008-11-29 | Payer: Self-pay | Admitting: Family Medicine

## 2008-11-29 ENCOUNTER — Emergency Department (HOSPITAL_COMMUNITY): Admission: EM | Admit: 2008-11-29 | Discharge: 2008-11-29 | Payer: Self-pay | Admitting: Emergency Medicine

## 2009-01-30 ENCOUNTER — Emergency Department (HOSPITAL_COMMUNITY): Admission: EM | Admit: 2009-01-30 | Discharge: 2009-01-30 | Payer: Self-pay | Admitting: Emergency Medicine

## 2009-02-23 ENCOUNTER — Emergency Department (HOSPITAL_COMMUNITY): Admission: EM | Admit: 2009-02-23 | Discharge: 2009-02-23 | Payer: Self-pay | Admitting: Emergency Medicine

## 2009-02-28 ENCOUNTER — Emergency Department (HOSPITAL_COMMUNITY): Admission: EM | Admit: 2009-02-28 | Discharge: 2009-02-28 | Payer: Self-pay | Admitting: Emergency Medicine

## 2009-04-25 ENCOUNTER — Ambulatory Visit (HOSPITAL_COMMUNITY): Admission: RE | Admit: 2009-04-25 | Discharge: 2009-04-25 | Payer: Self-pay | Admitting: Internal Medicine

## 2009-05-09 ENCOUNTER — Encounter: Payer: Self-pay | Admitting: Orthopedic Surgery

## 2009-05-17 ENCOUNTER — Ambulatory Visit: Payer: Self-pay | Admitting: Orthopedic Surgery

## 2009-05-23 ENCOUNTER — Encounter: Payer: Self-pay | Admitting: Orthopedic Surgery

## 2009-05-23 ENCOUNTER — Encounter (HOSPITAL_COMMUNITY): Admission: RE | Admit: 2009-05-23 | Discharge: 2009-06-22 | Payer: Self-pay | Admitting: Orthopedic Surgery

## 2009-06-20 ENCOUNTER — Encounter: Payer: Self-pay | Admitting: Orthopedic Surgery

## 2009-06-27 ENCOUNTER — Encounter (HOSPITAL_COMMUNITY): Admission: RE | Admit: 2009-06-27 | Discharge: 2009-07-27 | Payer: Self-pay | Admitting: Orthopedic Surgery

## 2009-07-05 ENCOUNTER — Encounter: Payer: Self-pay | Admitting: Orthopedic Surgery

## 2009-07-13 ENCOUNTER — Encounter: Payer: Self-pay | Admitting: Orthopedic Surgery

## 2009-07-26 ENCOUNTER — Telehealth: Payer: Self-pay | Admitting: Orthopedic Surgery

## 2009-07-27 ENCOUNTER — Ambulatory Visit: Payer: Self-pay | Admitting: Orthopedic Surgery

## 2009-10-19 ENCOUNTER — Emergency Department (HOSPITAL_COMMUNITY): Admission: EM | Admit: 2009-10-19 | Discharge: 2009-10-19 | Payer: Self-pay | Admitting: Emergency Medicine

## 2009-12-17 ENCOUNTER — Ambulatory Visit (HOSPITAL_COMMUNITY): Admission: RE | Admit: 2009-12-17 | Discharge: 2009-12-17 | Payer: Self-pay | Admitting: Internal Medicine

## 2010-01-19 ENCOUNTER — Ambulatory Visit (HOSPITAL_COMMUNITY): Admission: RE | Admit: 2010-01-19 | Discharge: 2010-01-19 | Payer: Self-pay | Admitting: Pulmonary Disease

## 2010-02-16 ENCOUNTER — Ambulatory Visit (HOSPITAL_COMMUNITY): Admission: RE | Admit: 2010-02-16 | Discharge: 2010-02-16 | Payer: Self-pay | Admitting: Pulmonary Disease

## 2010-04-04 ENCOUNTER — Encounter: Payer: Self-pay | Admitting: Orthopedic Surgery

## 2010-05-01 ENCOUNTER — Ambulatory Visit (HOSPITAL_COMMUNITY): Admission: RE | Admit: 2010-05-01 | Discharge: 2010-05-01 | Payer: Self-pay | Admitting: Internal Medicine

## 2010-05-18 ENCOUNTER — Encounter: Admission: RE | Admit: 2010-05-18 | Discharge: 2010-05-18 | Payer: Self-pay | Admitting: Internal Medicine

## 2010-08-19 ENCOUNTER — Encounter: Payer: Self-pay | Admitting: Internal Medicine

## 2010-08-20 ENCOUNTER — Encounter: Payer: Self-pay | Admitting: Pulmonary Disease

## 2010-08-29 NOTE — Letter (Signed)
Summary: notes faxed to [facility]  notes faxed to [facility]   Imported By: Cammie Sickle 04/11/2010 19:13:55  _____________________________________________________________________  External Attachment:    Type:   Image     Comment:   External Document

## 2010-10-14 IMAGING — CR DG CHEST 2V
1 series · 2 of 2 positions shown · non-contrast
Comparison: none

REASON FOR EXAM: Fall
COMMENTS:

PROCEDURE:     DXR - DXR CHEST PA (OR AP) AND LATERAL  - May 28, 2008 [DATE]
RESULT:     The lungs are clear. The cardiovascular structures are
unremarkable.

[Series 1: view not recorded · 0.17mm/px · 2 of 2 slices shown]
[im 1/2]
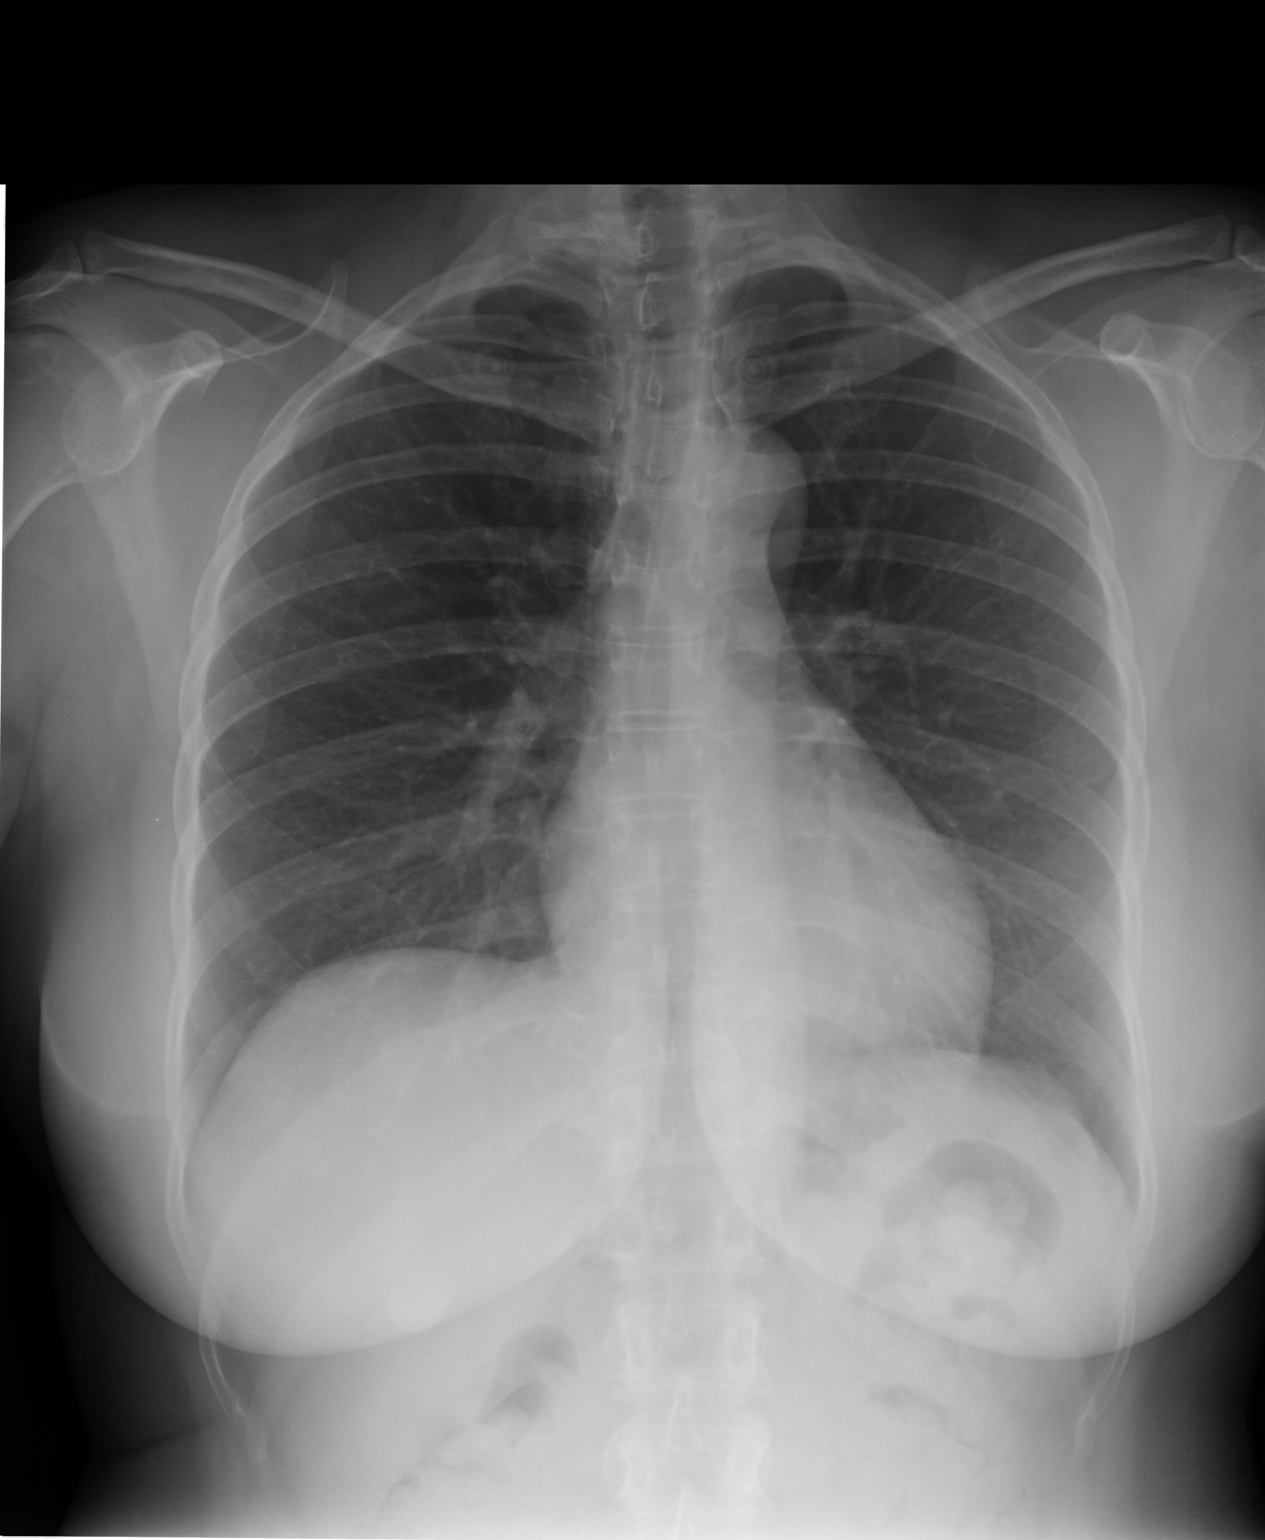
[im 2/2]
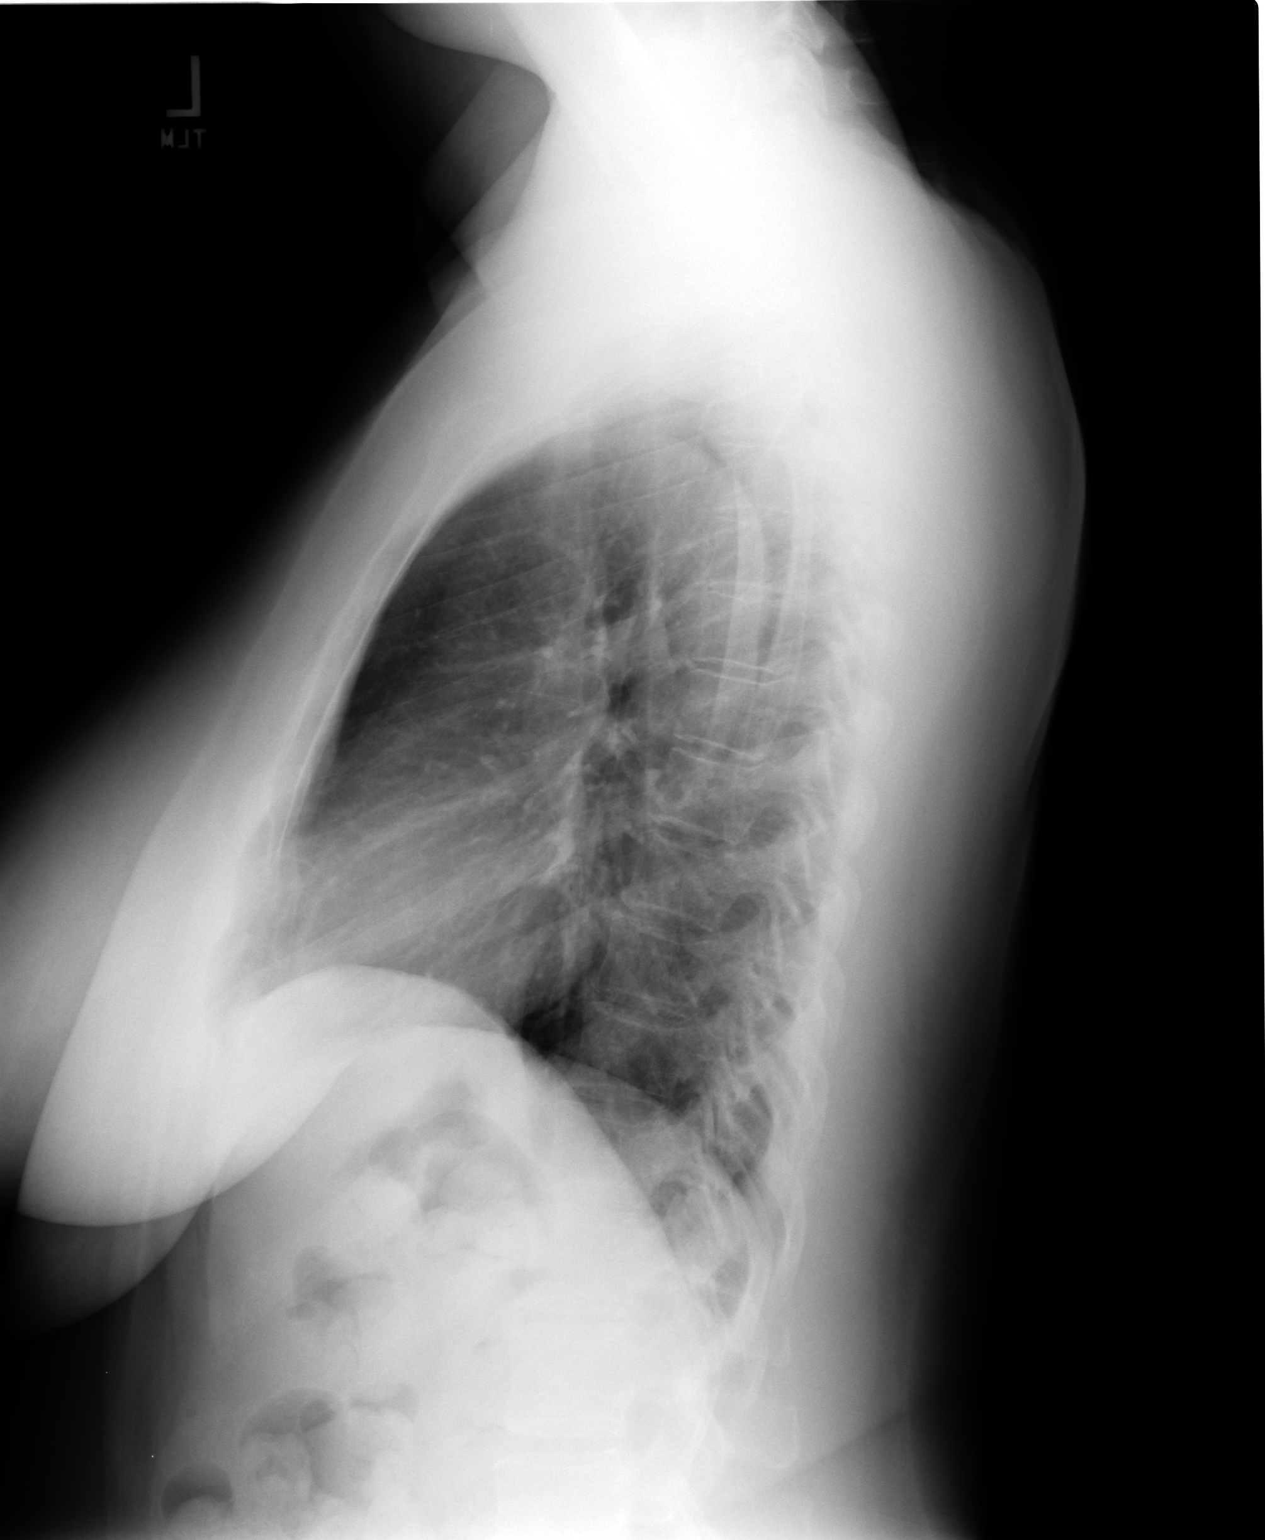

[2 of 2 positions shown; findings below may reference images not displayed]

IMPRESSION: No acute cardiopulmonary disease.

## 2010-10-14 IMAGING — CT CT HEAD WITHOUT CONTRAST
1 series · 16 of 30 positions shown, 20 images · non-contrast
Comparison: none

REASON FOR EXAM: Fall, positive loss of consciousness
COMMENTS:

PROCEDURE:     CT  - CT HEAD WITHOUT CONTRAST  - May 28, 2008 [DATE]
RESULT:     The patient has a history of a fall.
TECHNIQUE: Nonenhanced head CT is obtained.
There are no prior studies available for comparison.

[Series 2: without · axial · non-contrast · 0.42mm/px · z∈[+438,+572]mm · 16 of 30 slices shown, 20 images]
[im 2/30  brain]
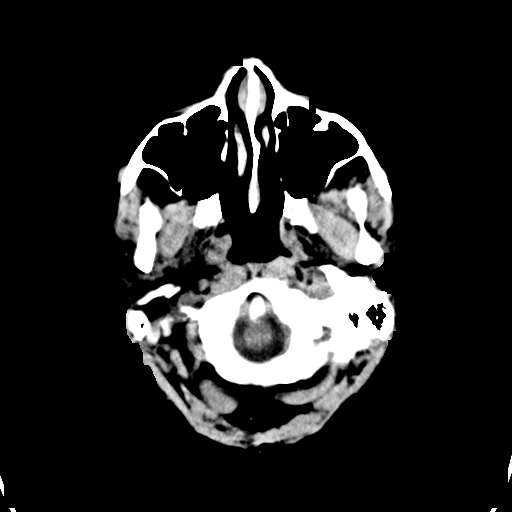
[im 2/30  bone]
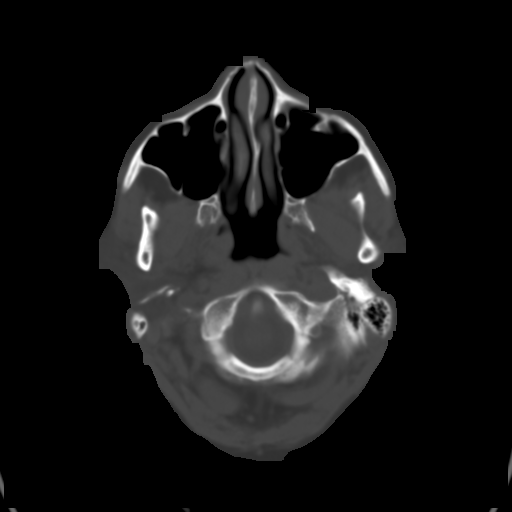
[im 4/30  brain]
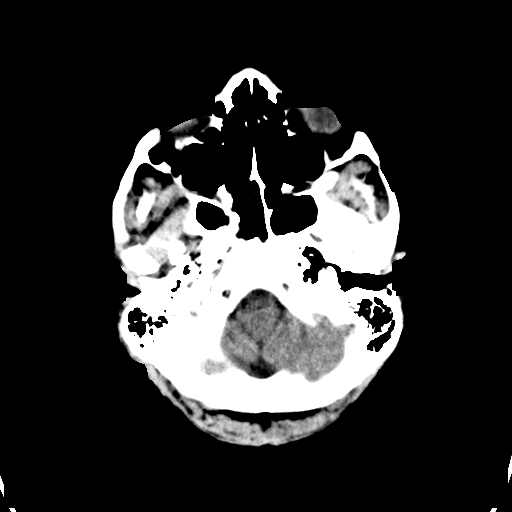
[im 6/30  brain]
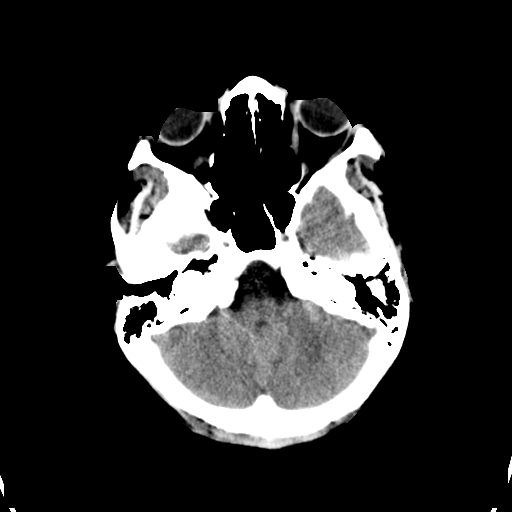
[im 8/30  brain]
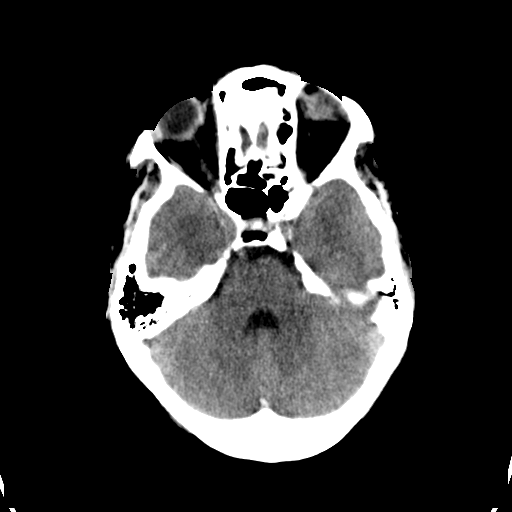
[im 9/30  brain]
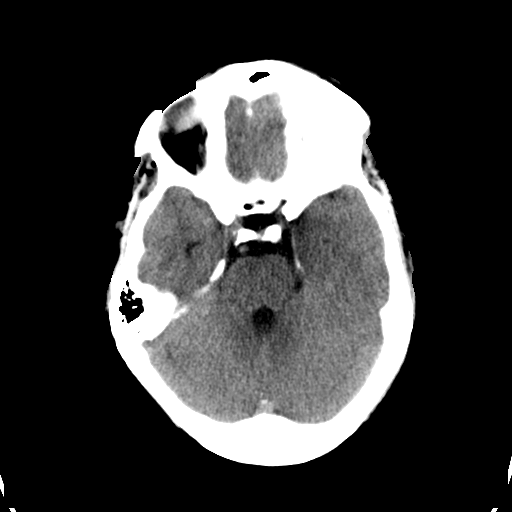
[im 9/30  bone]
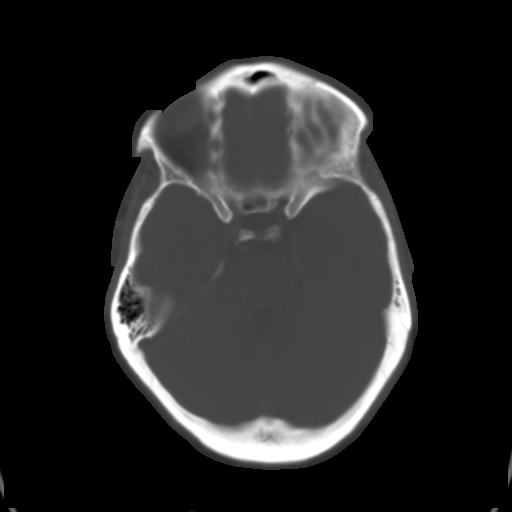
[im 11/30  brain]
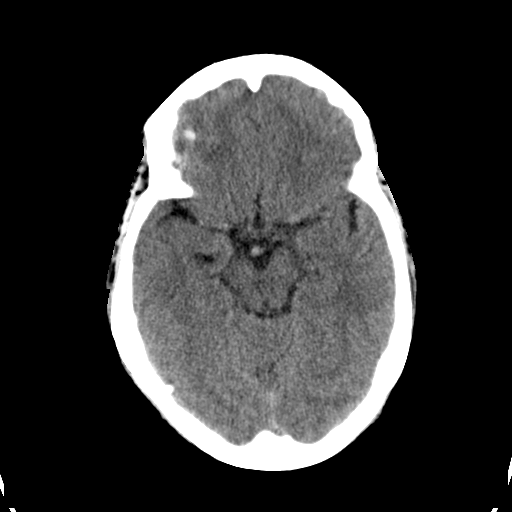
[im 13/30  brain]
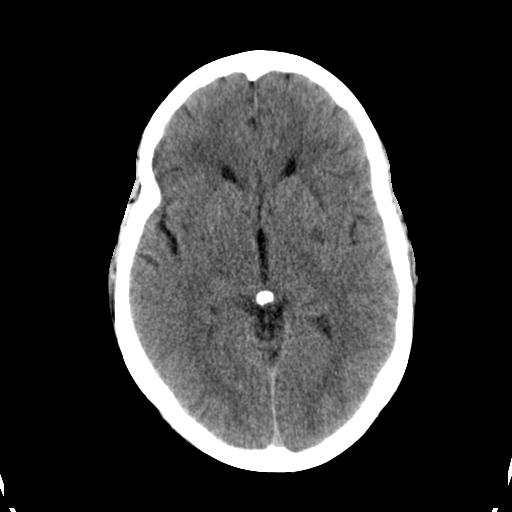
[im 15/30  brain]
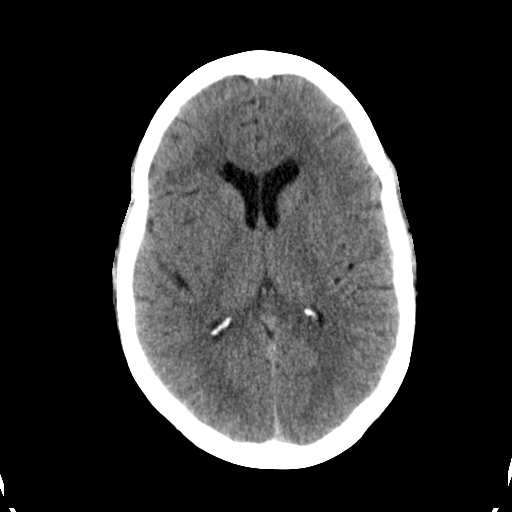
[im 16/30  brain]
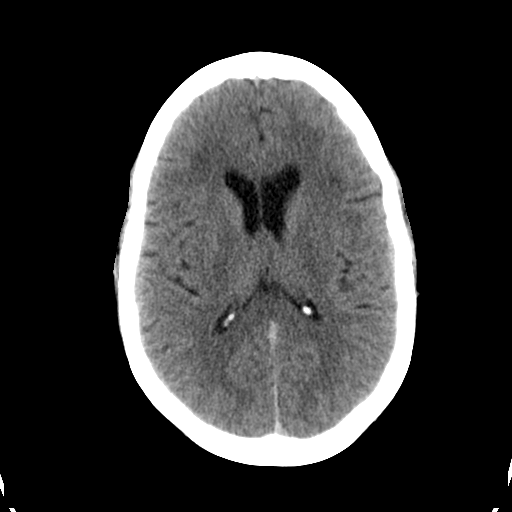
[im 16/30  bone]
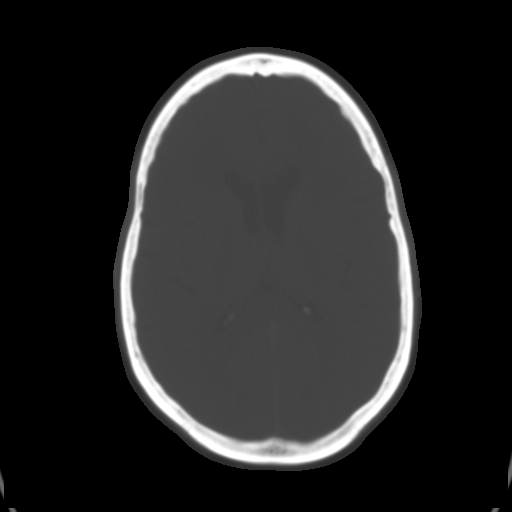
[im 18/30  brain]
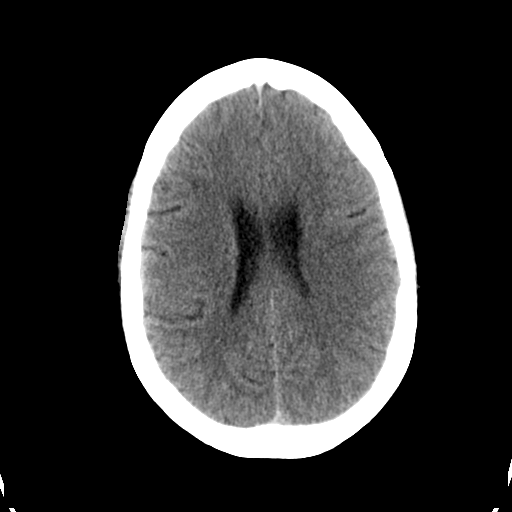
[im 20/30  brain]
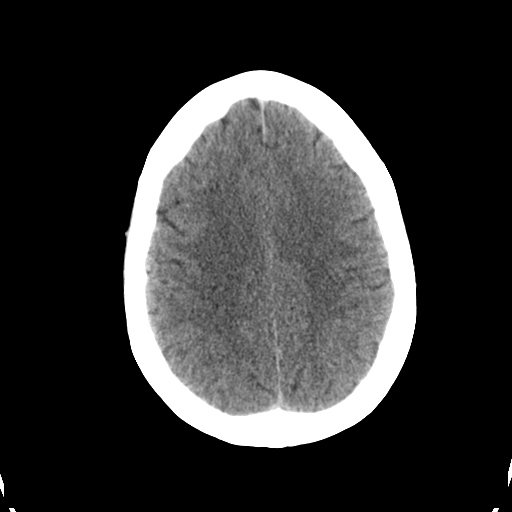
[im 22/30  brain]
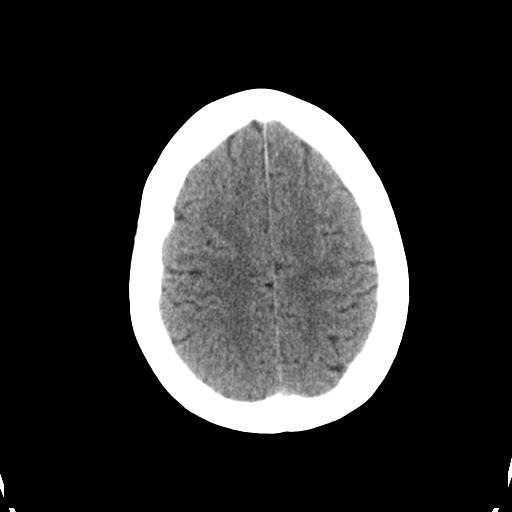
[im 23/30  brain]
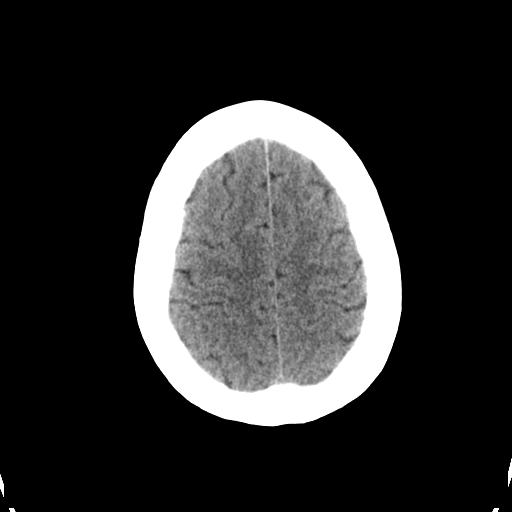
[im 23/30  bone]
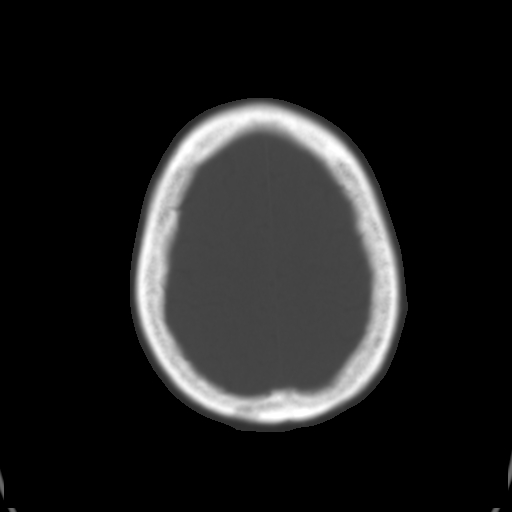
[im 25/30  brain]
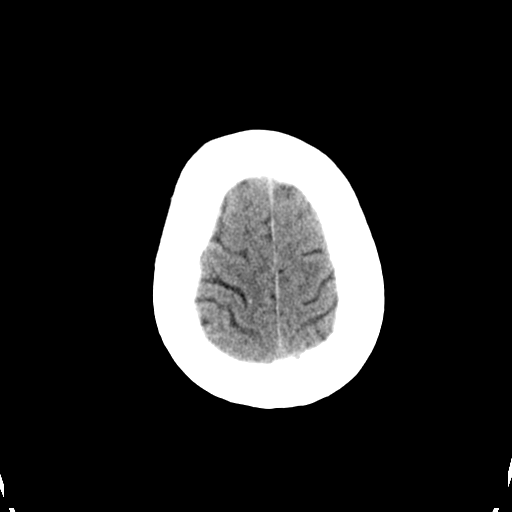
[im 27/30  brain]
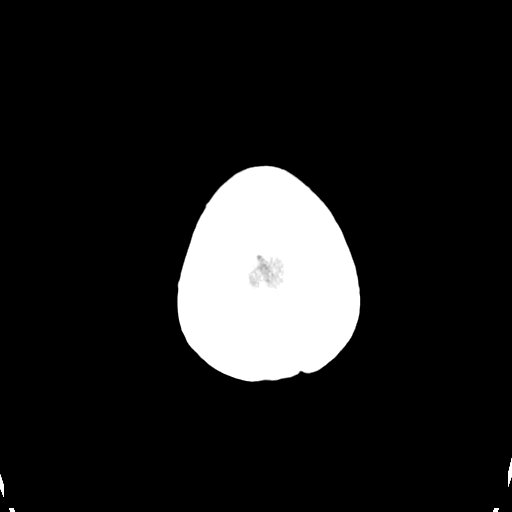
[im 29/30  brain]
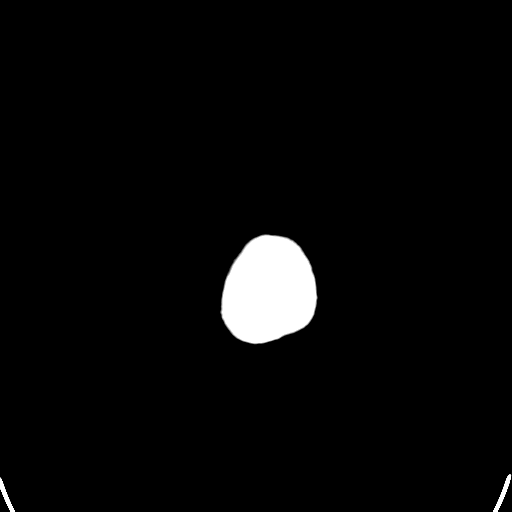

[16 of 30 positions shown; findings below may reference images not displayed]

FINDINGS: No intra-axial or extra-axial pathologic fluid or blood
collections are identified. There is an 8.0 mm cystic structure in the
region of the LEFT lenticular nucleus. This could represent a prominent
perivascular space or old lacunar infarction. Cystic tumor would be less
likely. MRI of brain is suggested for further evaluation.

No bony abnormalities are identified.
IMPRESSION: There is an 8.0 mm basal ganglia cystic structure and
possibly a prominent perivascular space. Old lacunar infarction cannot be
excluded. To exclude other cystic lesions including infection or tumor, MRI
of brain is suggested.

## 2010-10-14 IMAGING — CT CT CERVICAL SPINE WITHOUT CONTRAST
3 series · 16 of 33 positions shown, 19 images · non-contrast
Comparison: none

REASON FOR EXAM: Fall
COMMENTS:

PROCEDURE:     CT  - CT CERVICAL SPINE WO  - May 28, 2008 [DATE]
RESULT:     The patient has a history of a fall.
TECHNIQUE: Standard cervical spine CT is obtained without contrast.
There are no prior studies available for comparison.

[Series 3: axial · axial · 0.29mm/px · z∈[+308,+437]mm · 8 of 82 slices shown, 10 images]
[im 7/82  soft-tissue]
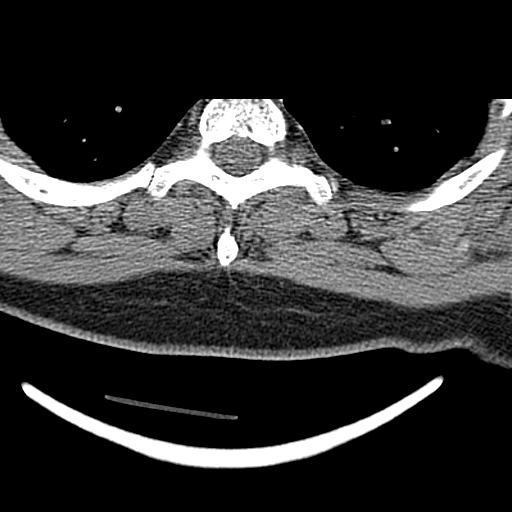
[im 7/82  bone]
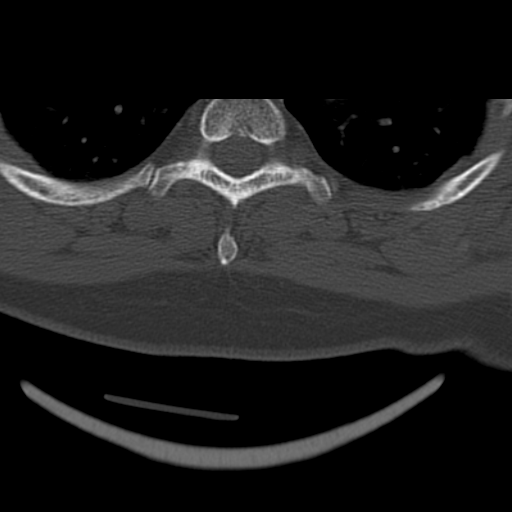
[im 19/82  bone]
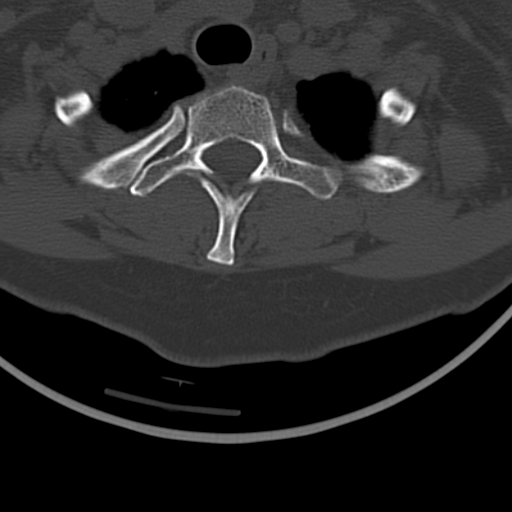
[im 25/82  bone]
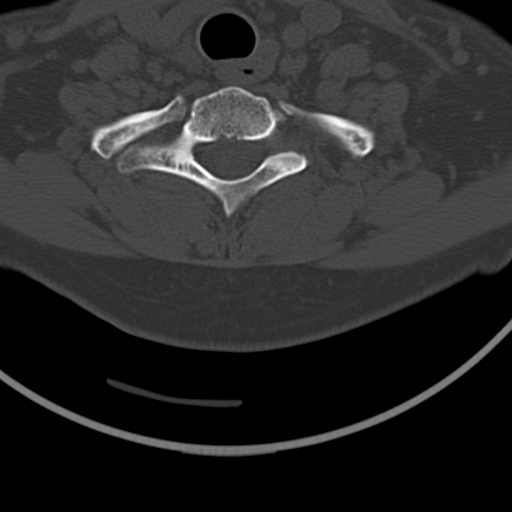
[im 38/82  bone]
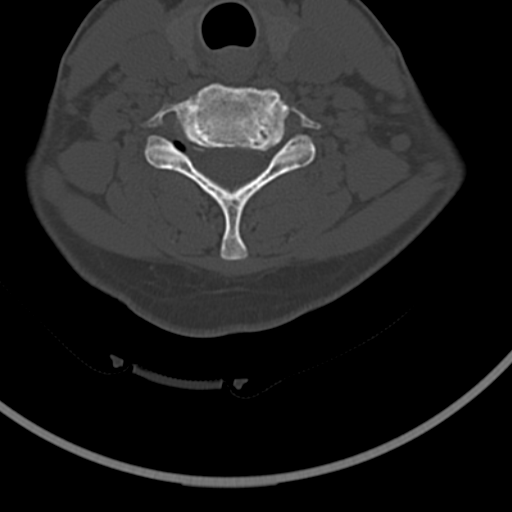
[im 44/82  soft-tissue]
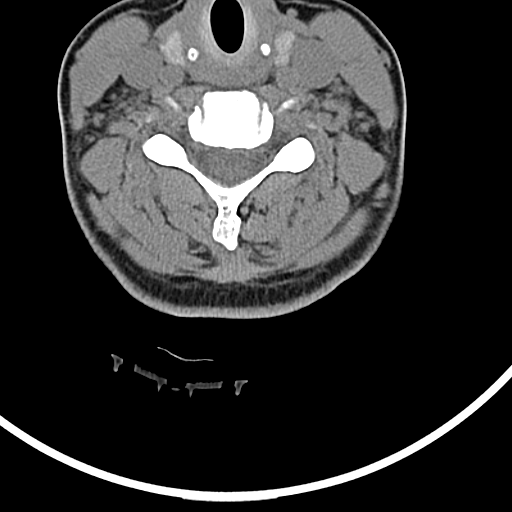
[im 44/82  bone]
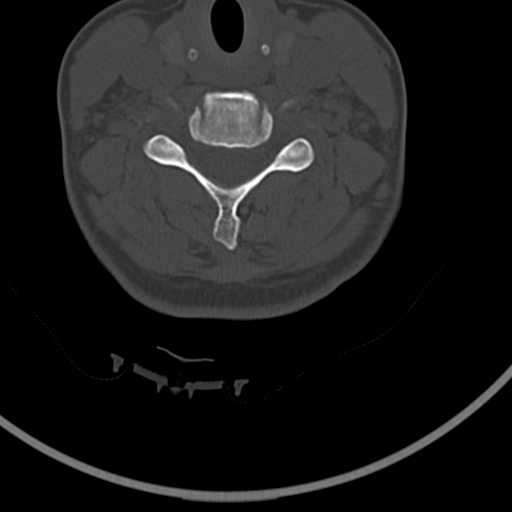
[im 57/82  bone]
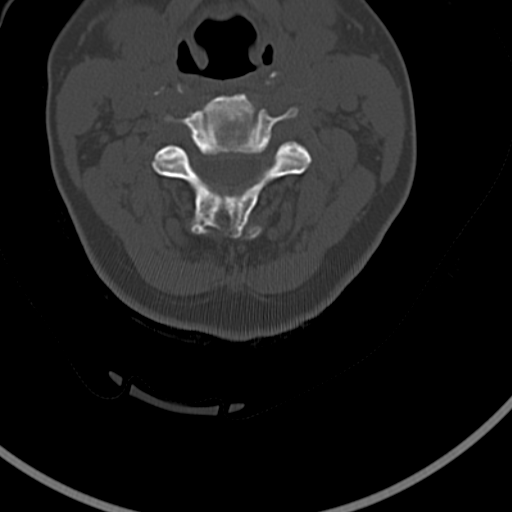
[im 63/82  bone]
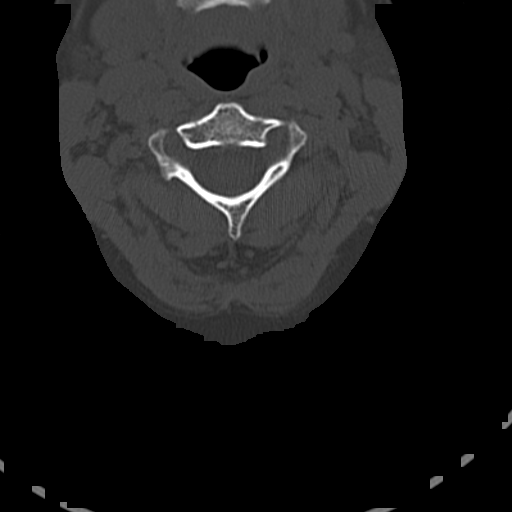
[im 75/82  bone]
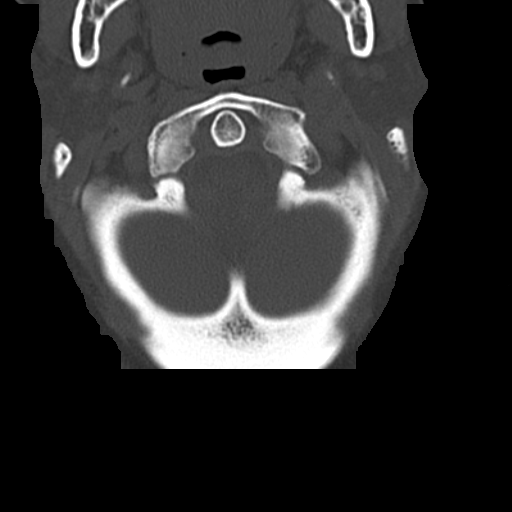

[Series 4: coronal · coronal · 0.42mm/px · 3 of 35 slices shown]
[im 7/35  bone]
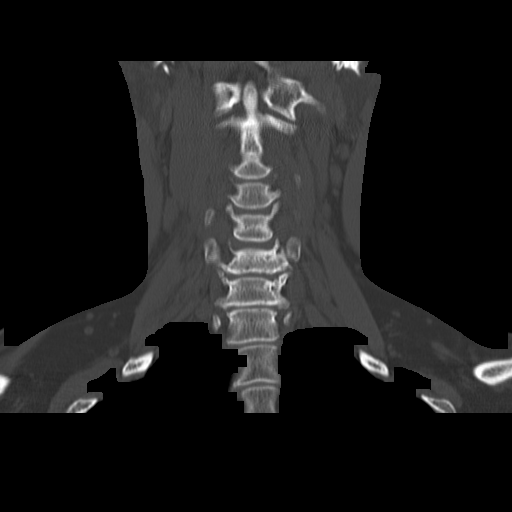
[im 14/35  bone]
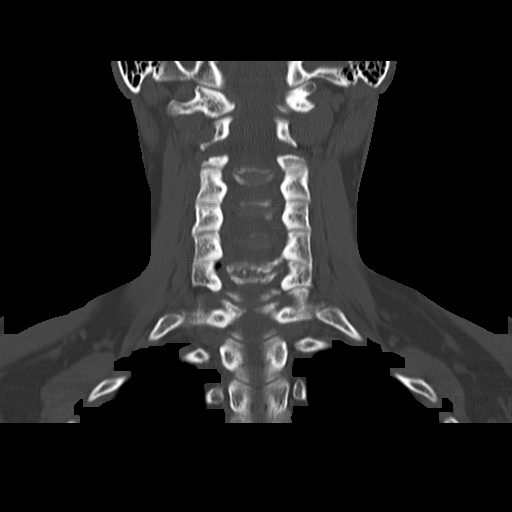
[im 21/35  bone]
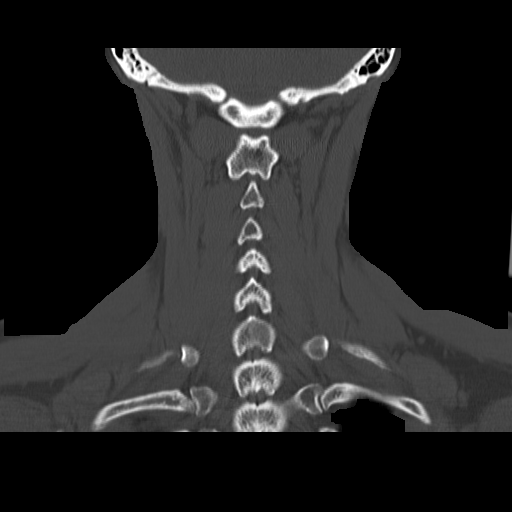

[Series 5: sagittal · sagittal · 0.54mm/px · 5 of 41 slices shown, 6 images]
[im 14/41  bone]
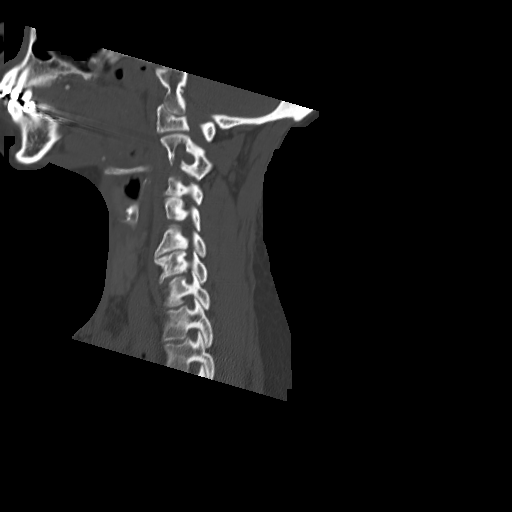
[im 17/41  bone]
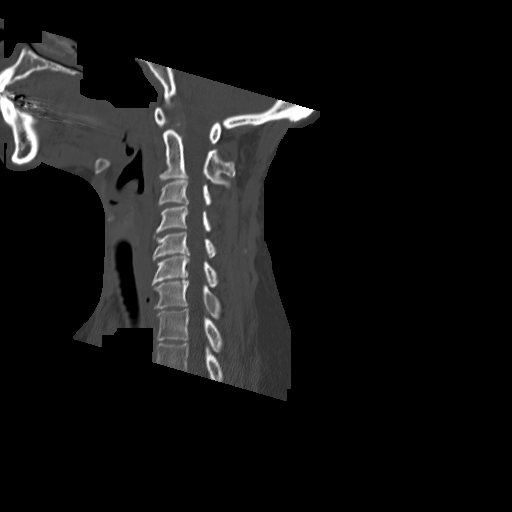
[im 21/41  soft-tissue]
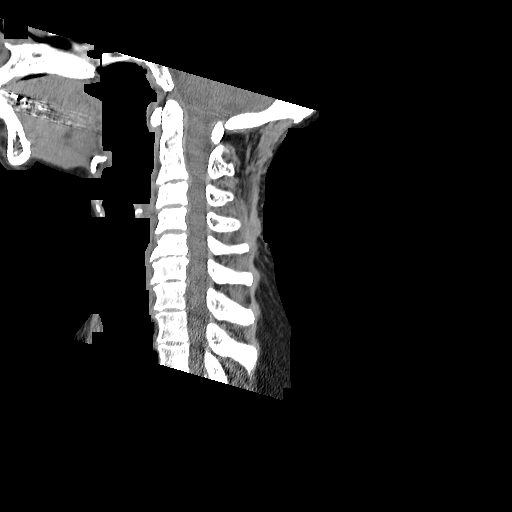
[im 21/41  bone]
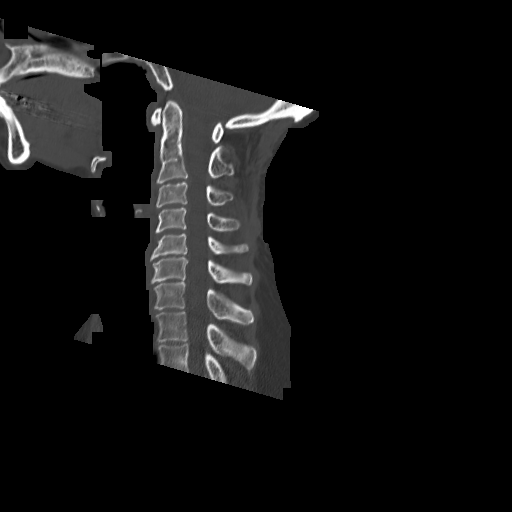
[im 24/41  bone]
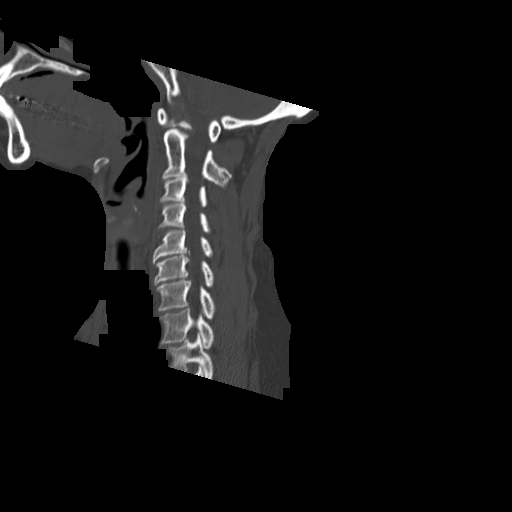
[im 27/41  bone]
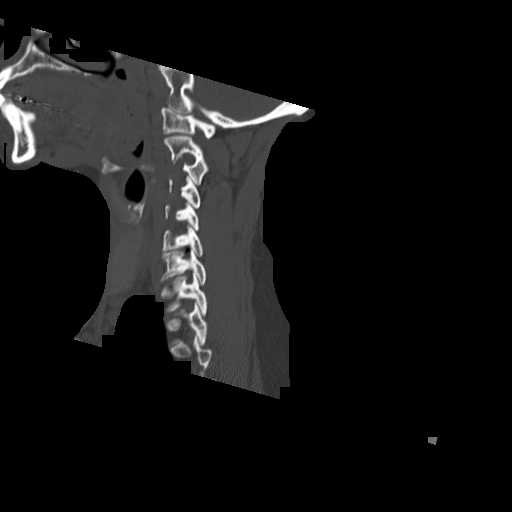

[16 of 33 positions shown; findings below may reference images not displayed]

FINDINGS: No acute soft tissue or bony abnormalities are identified.
Congenital fusion of C2 and C3 is present. Normal anatomic alignment is
present. Diffuse degenerative change is present. Facetal air is noted at the
level of C6-7. This is most likely degenerative in nature.
IMPRESSION: 1.  Degenerative change.
2.  Congenital fusion of C2 and C3.
3.  No acute abnormality.

This report was faxed to the Emergency Room physician at the time of the
study.

## 2010-10-14 IMAGING — CR DG LUMBAR SPINE AP/LAT/OBLIQUES W/ FLEX AND EXT
1 series · 5 of 5 positions shown · non-contrast
Comparison: none

REASON FOR EXAM: Fall, back pain
COMMENTS:

PROCEDURE:     DXR - DXR LUMBAR SPINE WITH OBLIQUES  - May 28, 2008 [DATE]
RESULT:     No acute soft tissue or bony abnormality identified. The
pedicles are intact. No acute abnormality is noted.

[Series 1: view not recorded · 0.17mm/px · 5 of 5 slices shown]
[im 1/5]
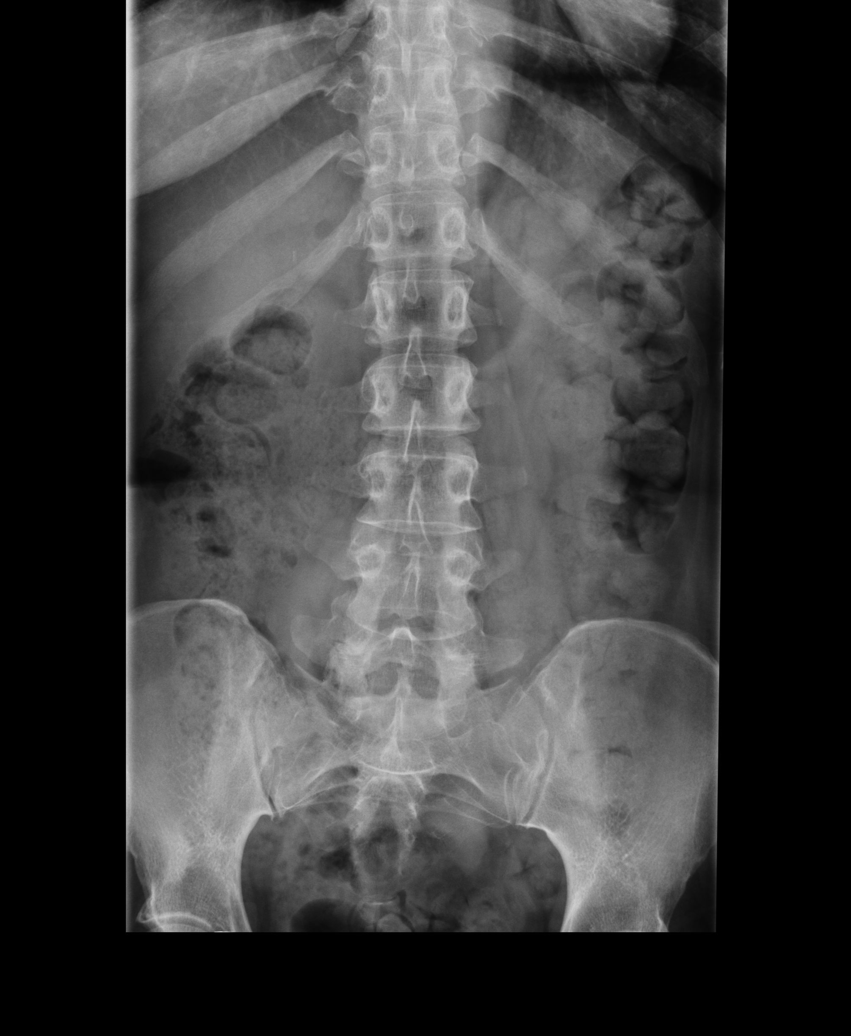
[im 2/5]
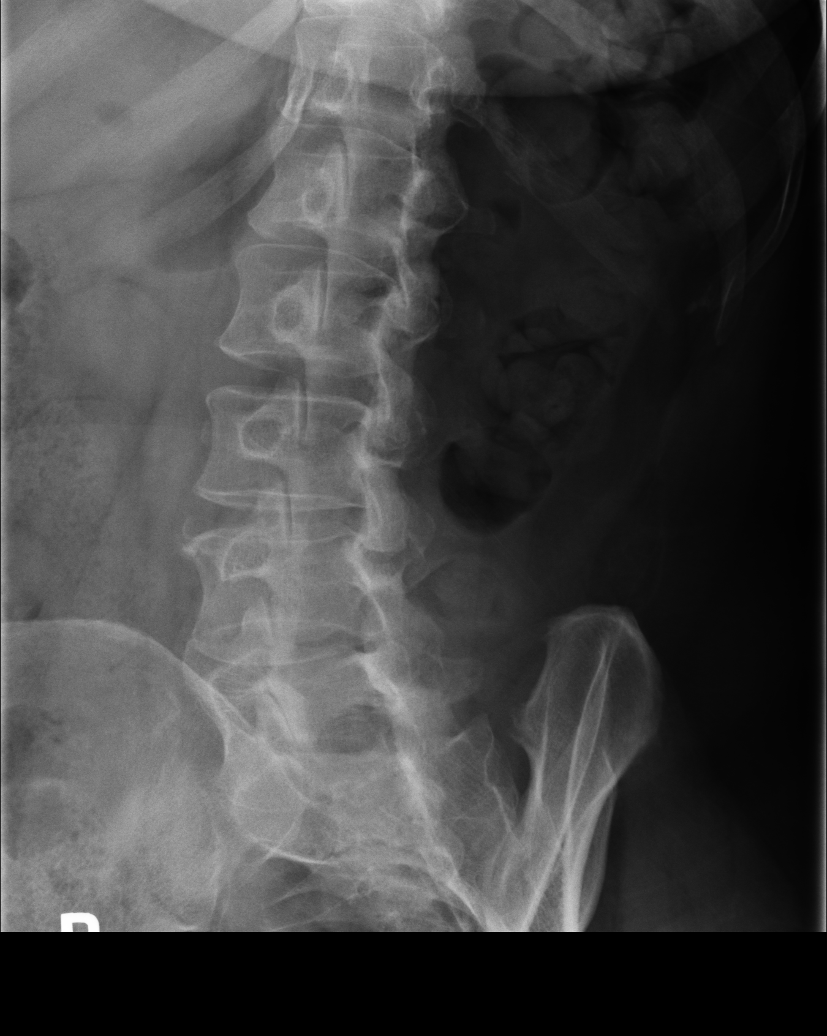
[im 3/5]
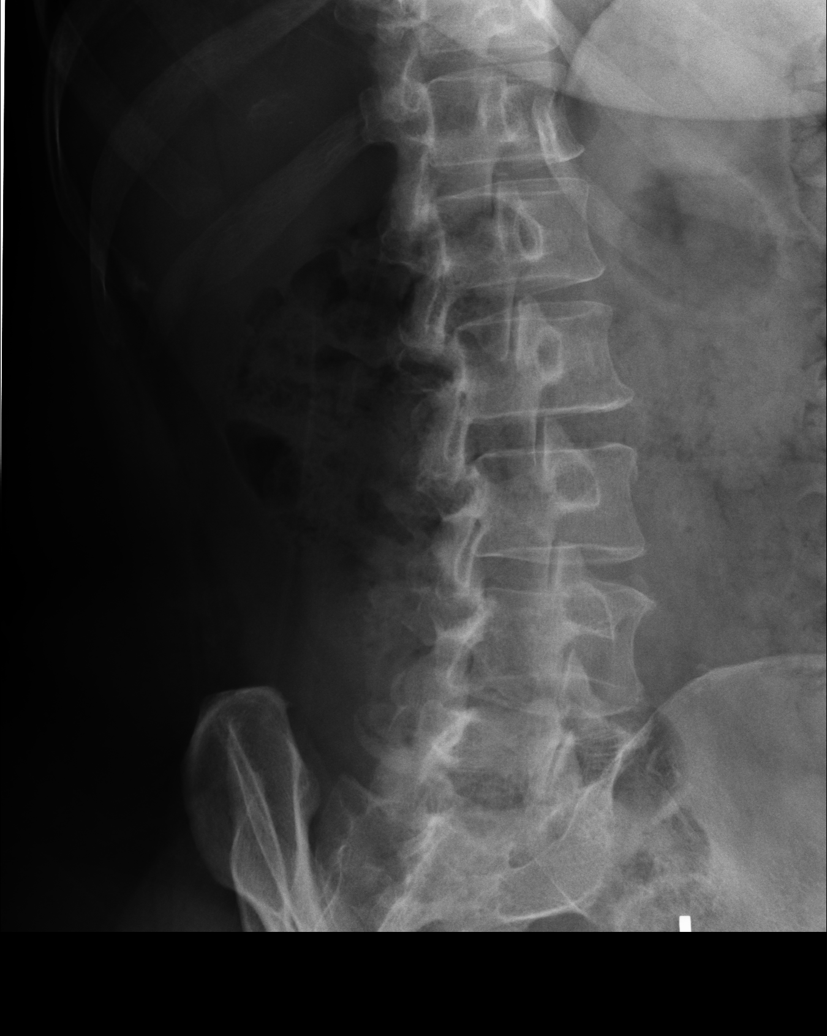
[im 4/5]
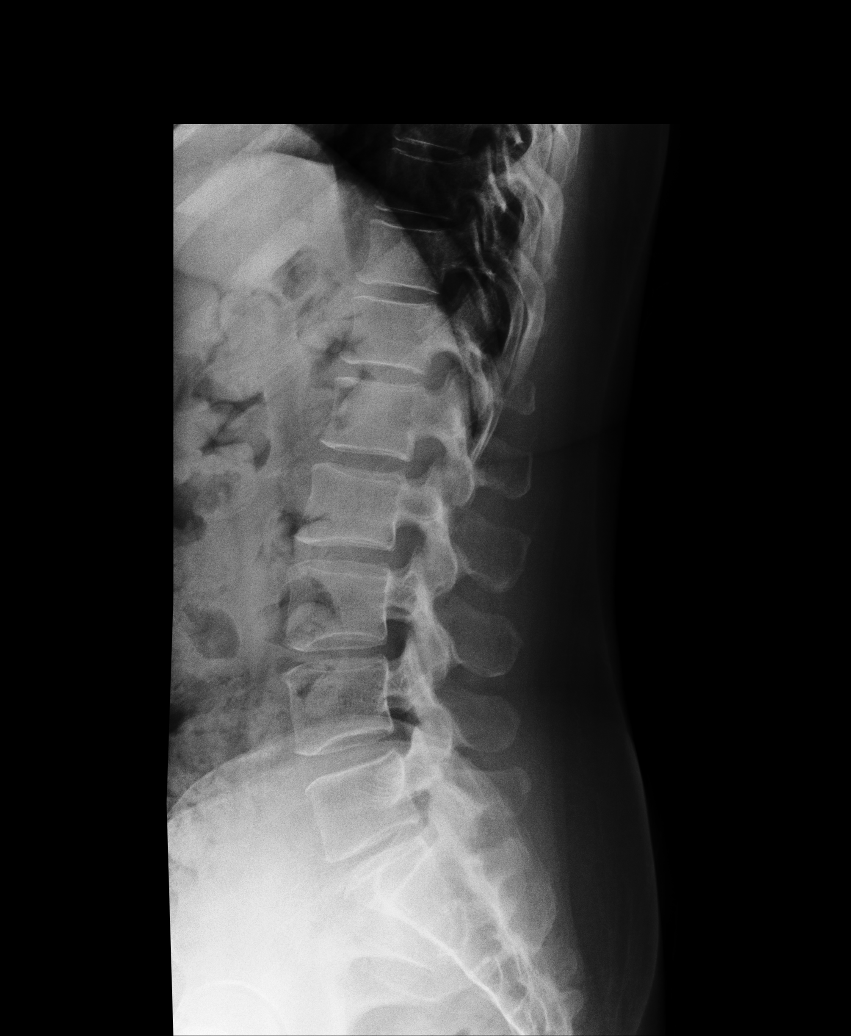
[im 5/5]
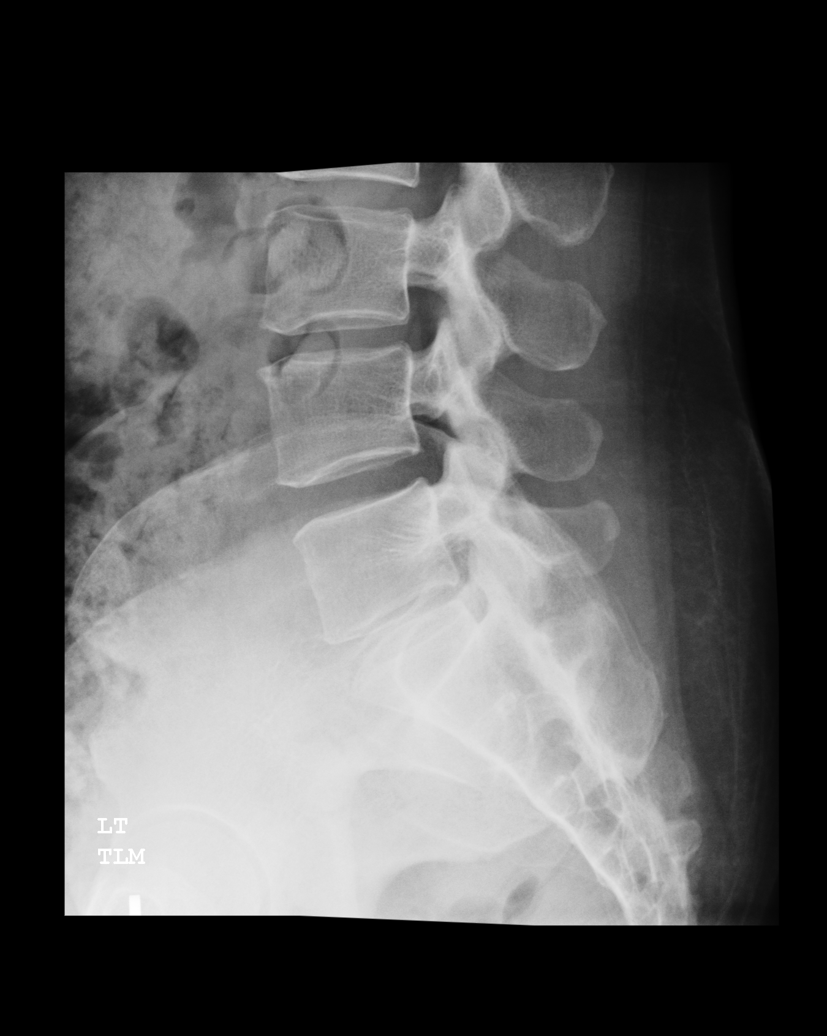

[5 of 5 positions shown; findings below may reference images not displayed]

IMPRESSION: No acute or focal abnormality.

## 2010-10-15 LAB — BLOOD GAS, ARTERIAL
Acid-base deficit: 0.7 mmol/L (ref 0.0–2.0)
Bicarbonate: 23.4 mEq/L (ref 20.0–24.0)
FIO2: 0.21 %
O2 Saturation: 94.7 %
Patient temperature: 37
TCO2: 20.7 mmol/L (ref 0–100)
pCO2 arterial: 38.6 mmHg (ref 35.0–45.0)
pH, Arterial: 7.399 (ref 7.350–7.400)
pO2, Arterial: 71.4 mmHg — ABNORMAL LOW (ref 80.0–100.0)

## 2010-10-19 IMAGING — CR DG CHEST 2V
1 series · 2 of 2 positions shown · non-contrast
Comparison: none

REASON FOR EXAM: chest pain [HOSPITAL]
COMMENTS:

[Series 1: view not recorded · 0.17mm/px · 2 of 2 slices shown]
[im 1/2]
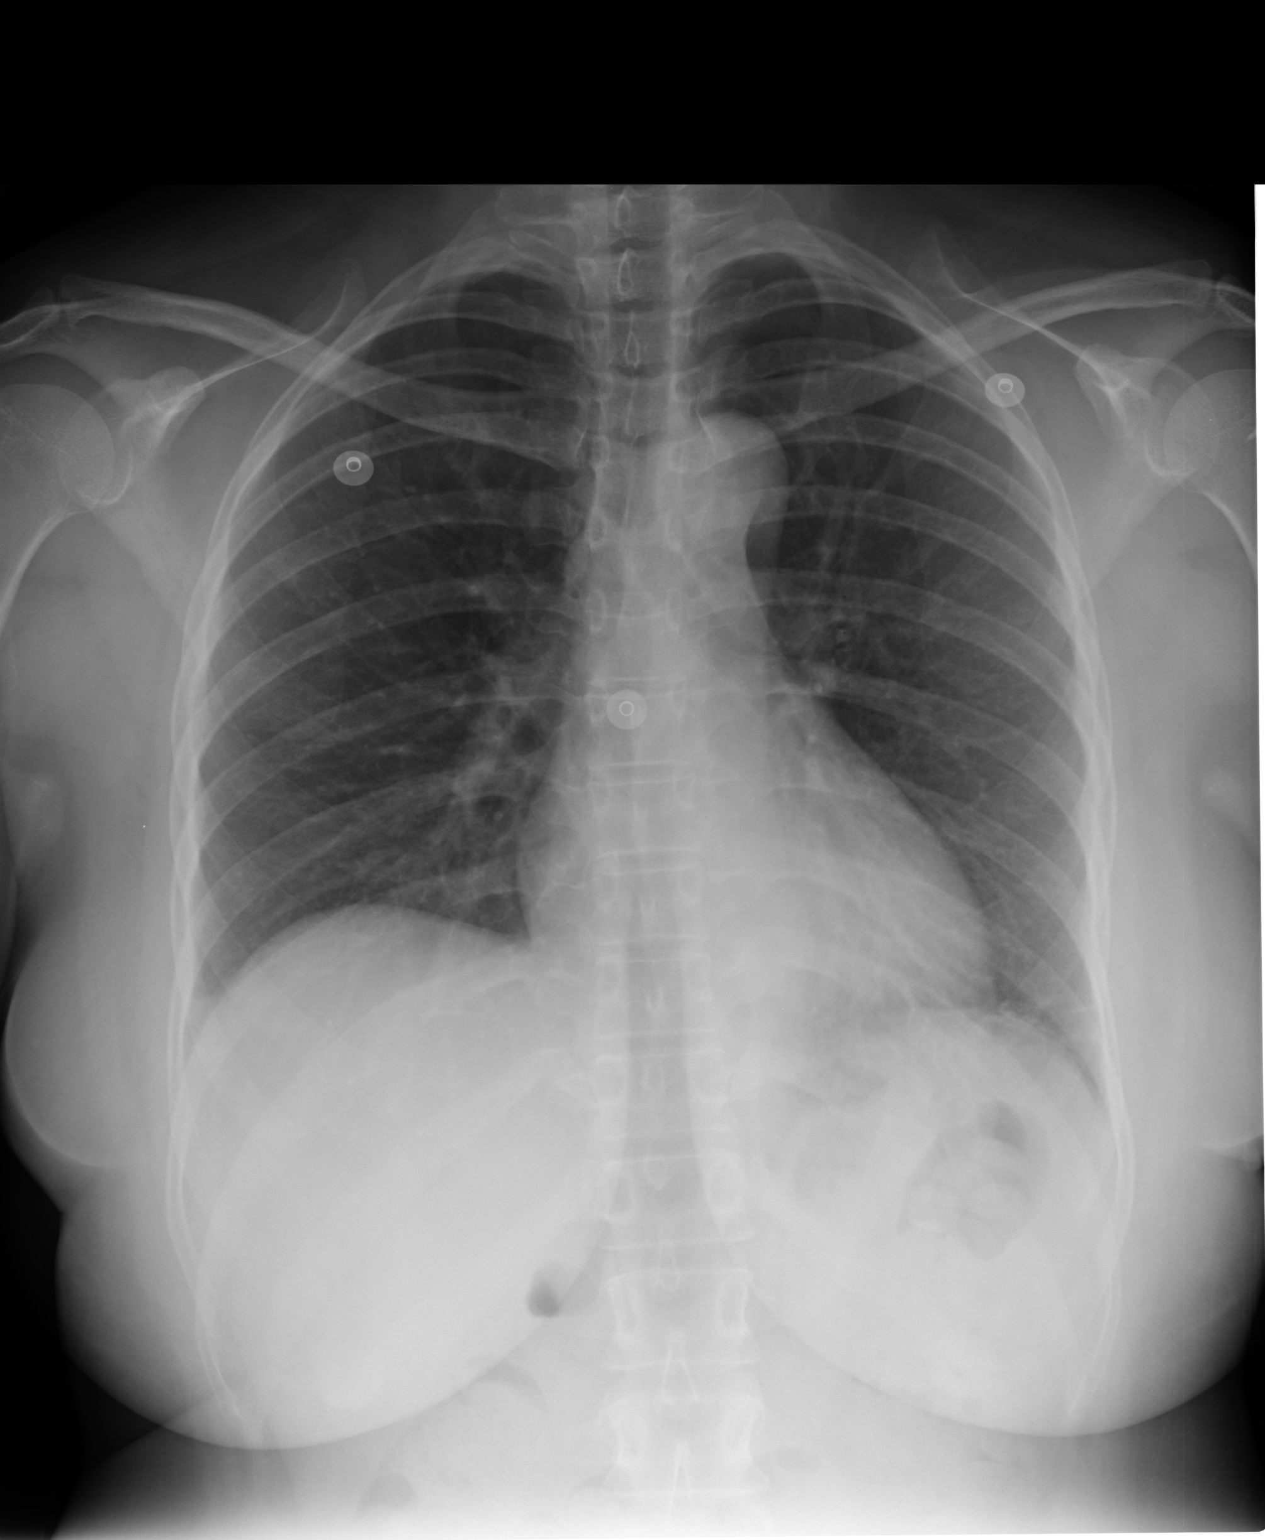
[im 2/2]
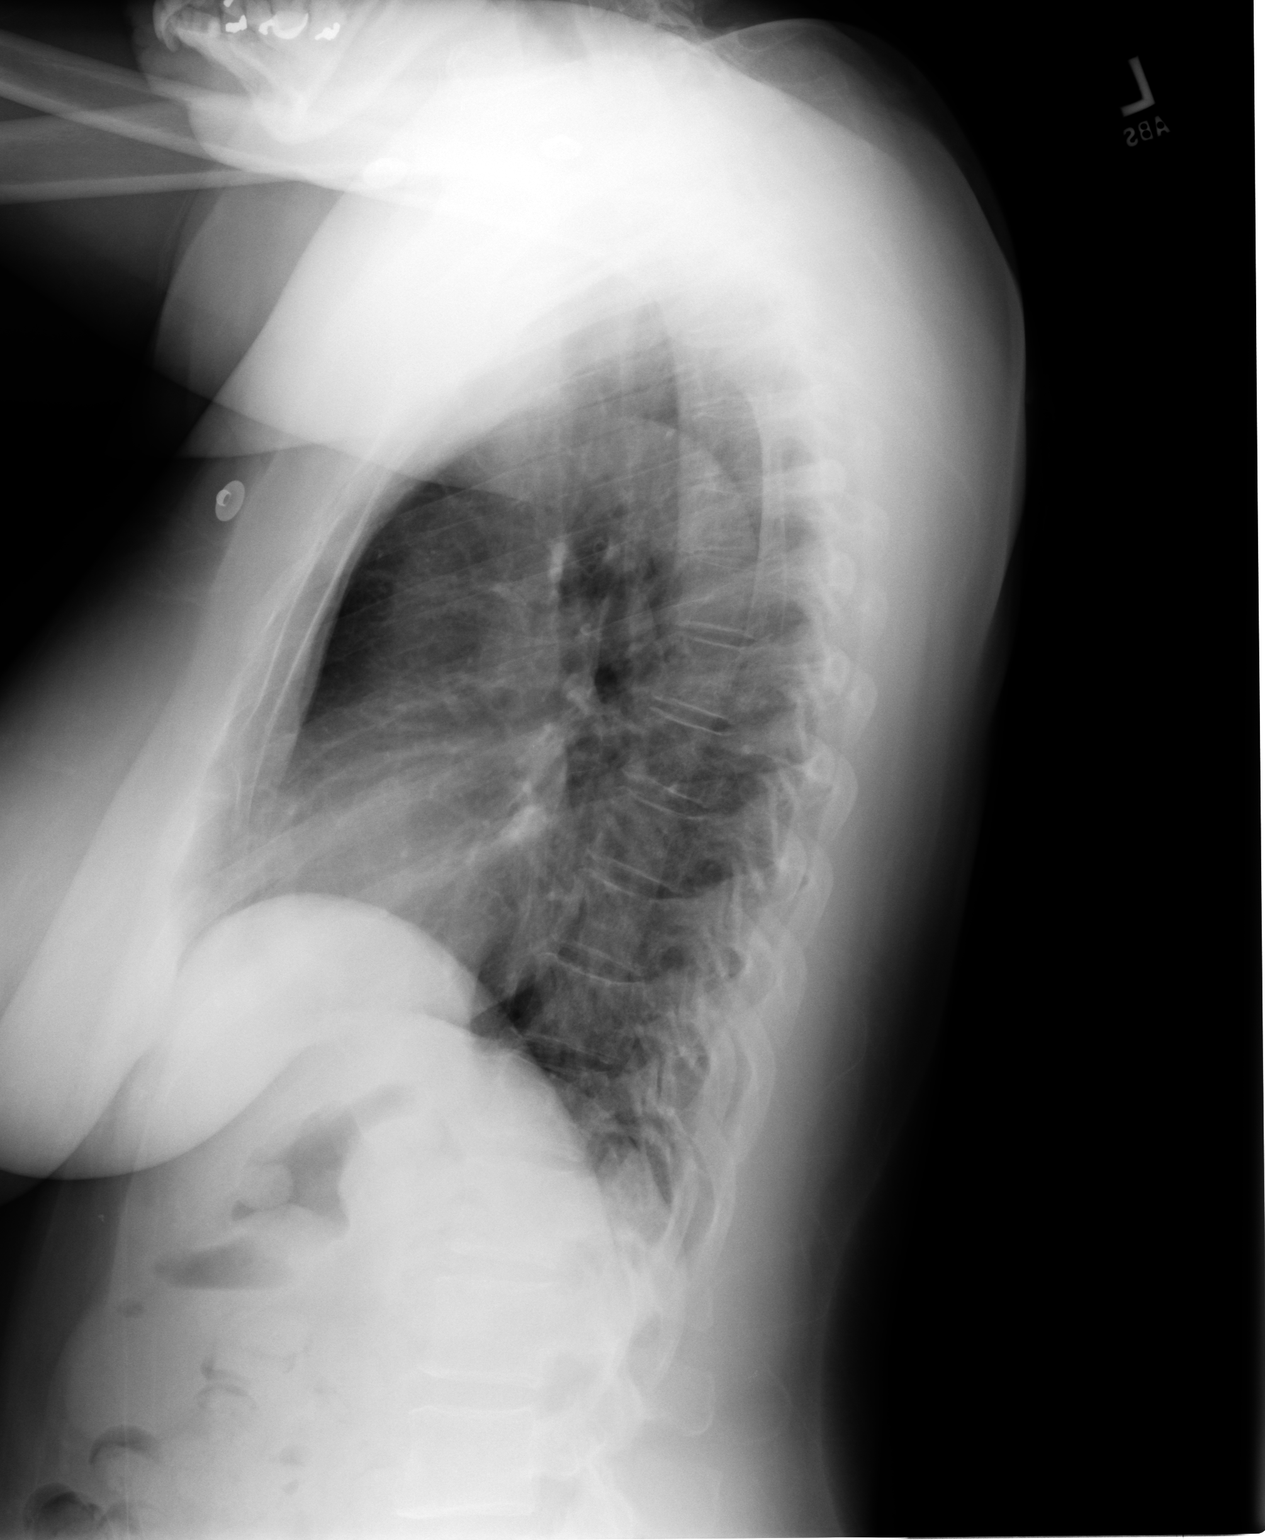

[2 of 2 positions shown; findings below may reference images not displayed]

PROCEDURE:     DXR - DXR CHEST PA (OR AP) AND LATERAL  - June 02, 2008  [DATE]

RESULT:     Comparison is made to the exam of 05/28/2008.

The lungs are clear. The heart and pulmonary vessels are normal. The bony
and mediastinal structures are unremarkable. There is no effusion. There is
no pneumothorax or evidence of congestive failure.
IMPRESSION: No acute cardiopulmonary disease.

## 2010-10-22 LAB — POCT CARDIAC MARKERS
CKMB, poc: <1 ng/mL — ABNORMAL LOW (ref 1.0–8.0)
Myoglobin, poc: 49.2 ng/mL (ref 12–200)
Troponin i, poc: <0.05 ng/mL (ref 0.00–0.09)

## 2010-10-24 IMAGING — MR MR HEAD WO/W CM
9 of 13 series · 30 of 48 positions shown · IV contrast (multihance)
Comparison: None available.

CLINICAL DATA: .  Headaches.  Blurred vision.  Impaired speech.
Abnormal CT of the brain.

MRI HEAD WITHOUT AND WITH CONTRAST
TECHNIQUE: Multiplanar, multiecho pulse sequences of the brain and
surrounding structures were obtained according to standard protocol
without and with intravenous contrast
Contrast: 13 ml of Multihance.

[Series 3: DWI · axial · 5.0mm · 0.86mm/px · z∈[-109,+22]mm · 2 of 25 slices shown (1 of 2)]
[im 1/25]
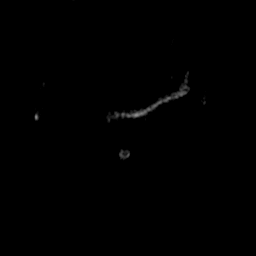
[im 25/25]
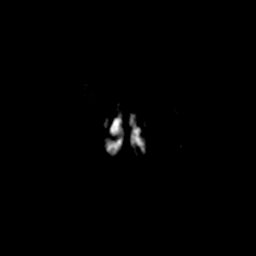

[Series 4: DWI · axial · 5.0mm · 0.86mm/px · z∈[-109,+22]mm · 3 of 25 slices shown (2 of 2)]
[im 1/25]
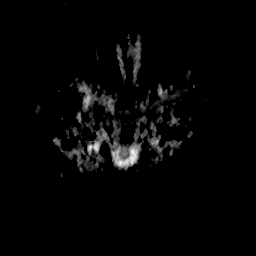
[im 13/25]
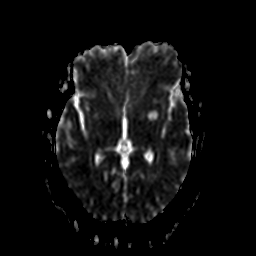
[im 25/25]
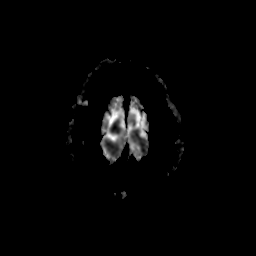

[Series 5: FLAIR · axial · 5.0mm · 0.94mm/px · z∈[-113,+23]mm · 3 of 24 slices shown (1 of 2)]
[im 1/24]
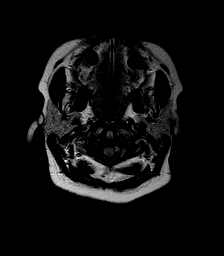
[im 12/24]
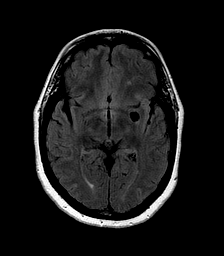
[im 24/24]
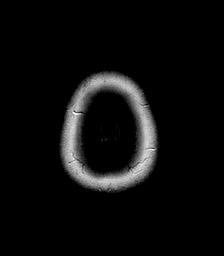

[Series 6: T2 · axial · 5.0mm · 0.62mm/px · z∈[-113,+23]mm · 3 of 24 slices shown (1 of 2)]
[im 1/24]
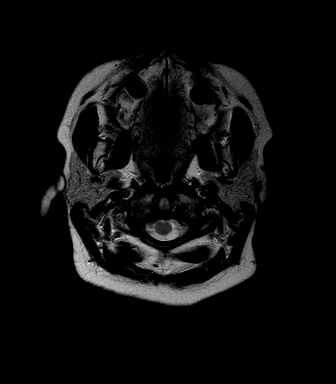
[im 12/24]
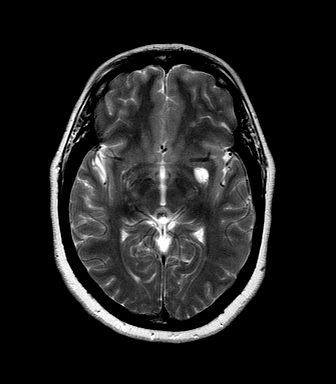
[im 24/24]
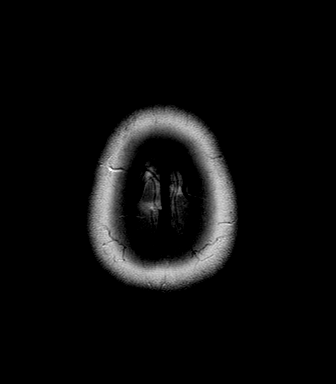

[Series 8: T2 · axial · 5.0mm · 0.47mm/px · z∈[-112,-47]mm · 2 of 24 slices shown (2 of 2)]
[im 1/24]
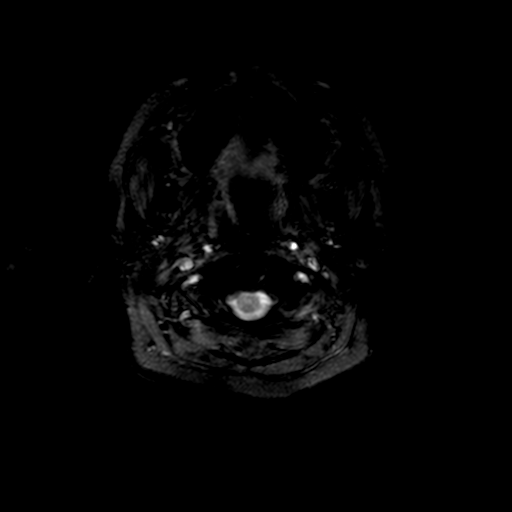
[im 12/24]
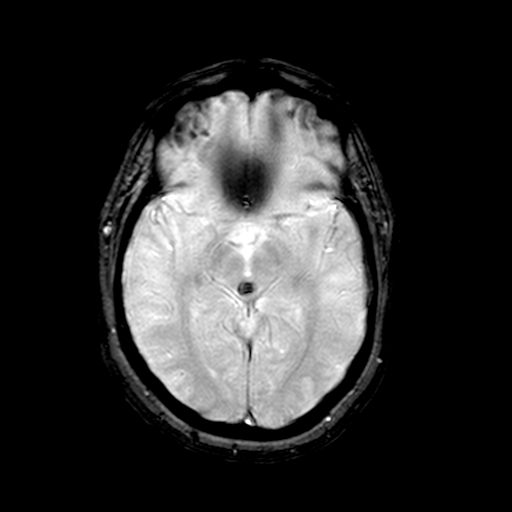

[Series 10: FLAIR · sagittal · 3.5mm · 0.47mm/px · 4 of 34 slices shown (2 of 2)]
[im 1/34]
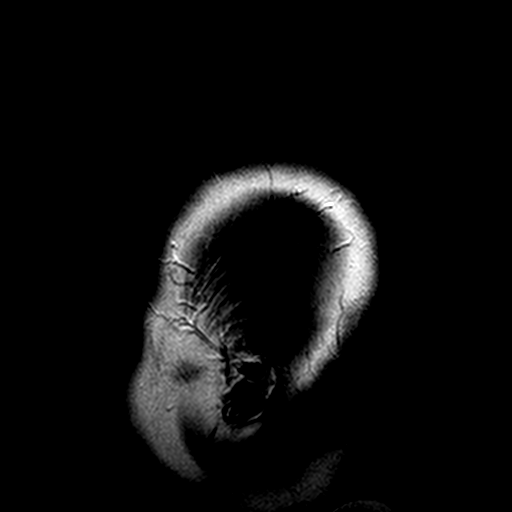
[im 12/34]
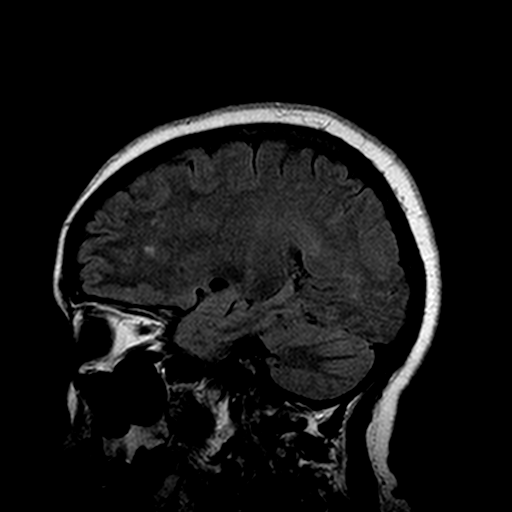
[im 23/34]
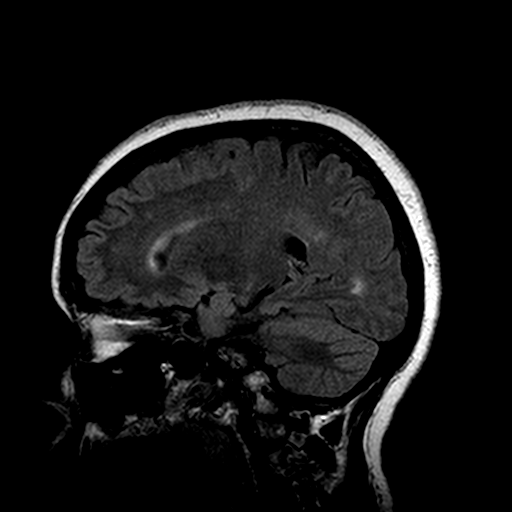
[im 34/34]
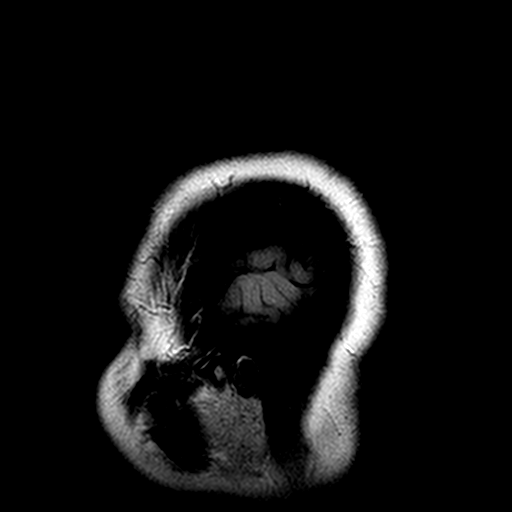

[Series 13: T1 post-contrast · axial · 2.0mm · 0.55mm/px · z∈[-123,+33]mm · 8 of 80 slices shown (1 of 3)]
[im 1/80]
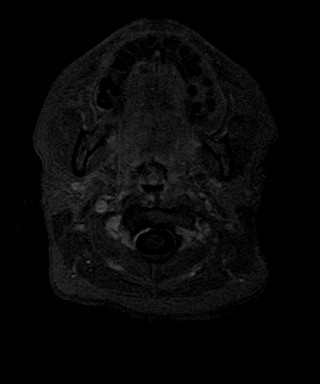
[im 10/80]
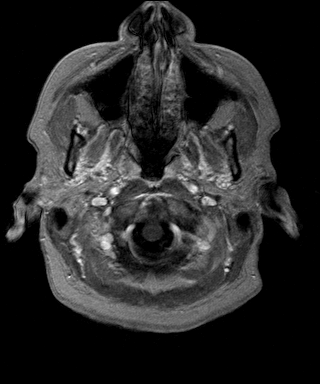
[im 20/80]
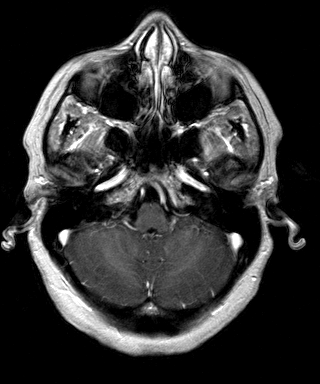
[im 30/80]
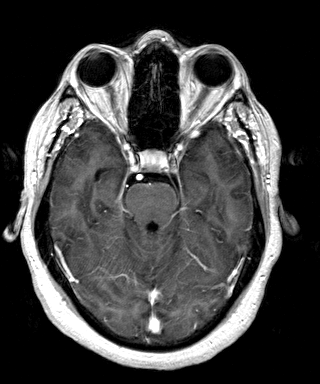
[im 50/80]
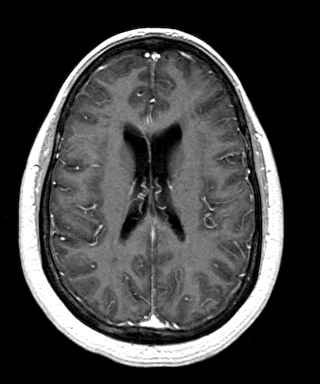
[im 60/80]
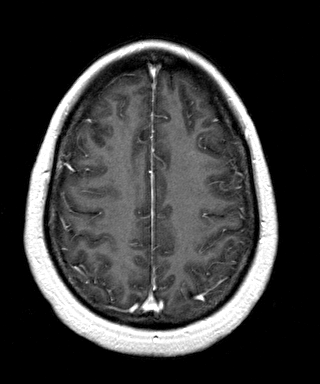
[im 70/80]
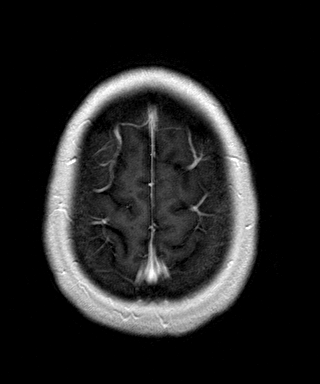
[im 80/80]
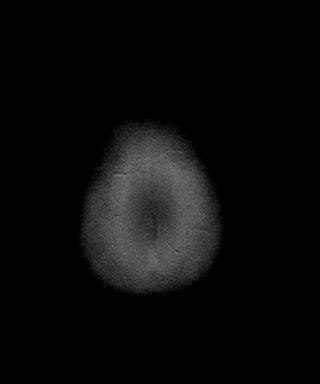

[Series 14: T1 post-contrast · coronal · 5.0mm · 0.45mm/px · 3 of 26 slices shown (2 of 3)]
[im 1/26]
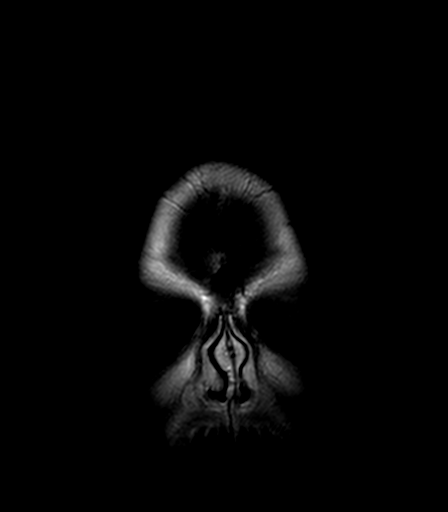
[im 13/26]
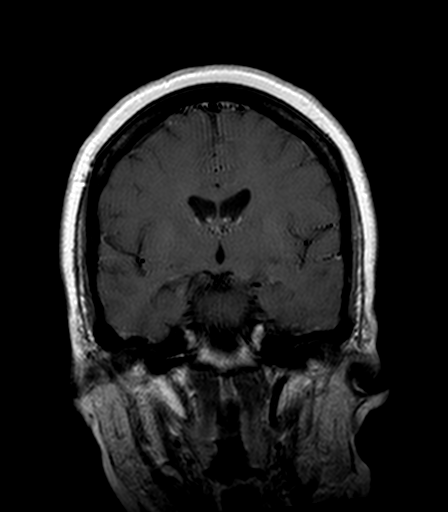
[im 26/26]
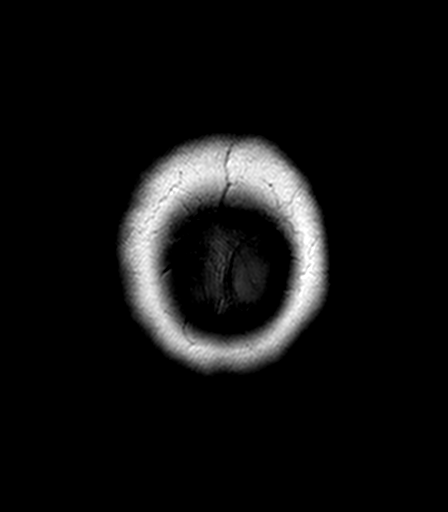

[Series 15: T1 post-contrast · sagittal · 5.0mm · 0.47mm/px · 2 of 20 slices shown (3 of 3)]
[im 1/20]
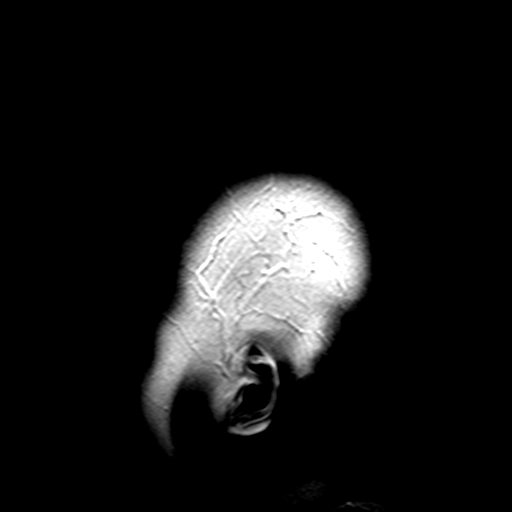
[im 20/20]
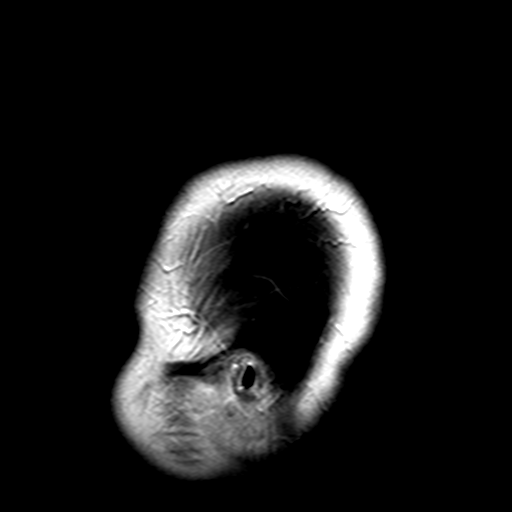

[30 of 48 positions shown; findings below may reference images not displayed]

FINDINGS: .

The gray-white matter differentiation is normal.  The sella is
appropriate in signal and morphology.  Clival signal is intact.
Cerebellar tonsils are above the level of the foramen magnum.

The odontoid process predental space and the prevertebral soft
tissues are grossly normal

No diffusion weighted abnormalities are seen.

Axial FLAIR and T2-weighted images demonstrate multiple small focal
signal hyperintensities in the subcortical white matter
bifrontally, lateral and anterior to the frontal horns, and the mid
frontal subcortical white matter bilaterally.  Minimal confluent
periventricular hyperintensity is also seen.

The ventricles are normal.

A 10.8 mm x 9 mm well defined cystic entity is seen at the level of
the left anterior commissure ..  This appears to be a intra-axial
not associated with surrounding gliosis .

No pathological intracranial enhancement is seen.  Physiologic
mineralization of the globus paladi is present.  Mastoids are
clear.  The internal artery canals are grossly symmetrical in
signal and morphology.

Flow voids are maintained in the major vessels at the cranial skull
base.  Mild inflammatory mucosal thickening of the ethmoids, the
frontal sinuses and the maxillary sinuses is seen.

The visualized orbital contents are grossly normal.  The dural
venous sinus are patent.
IMPRESSION: 1.  No evidence of acute ischemia.
2.  Non specific subcortical white matter changes supratentorially.
Differential considerations are areas of ischemic gliosis related
to small vessel disease due to hypertension and/or diabetes versus
a demyelinating process or vasculitis.
3.  10.8 mm x 9 mm cystic entity in the lateral aspect of the left
anterior commissure.  This probably represents a prominent
perivascular space.  Less likely possibilities are a
neuroepithelial cyst versus sequela of ischemia versus a low grade
cystic glial tumor..
4.  Mild inflammatory mucosal thickening of the paranasal sinuses
as described.
5.  A follow-up MRI scan may be considered to evaluate the
stability of the cystic entity  in  3-6 months.

## 2010-11-05 LAB — URINALYSIS, ROUTINE W REFLEX MICROSCOPIC
Bilirubin Urine: NEGATIVE
Glucose, UA: NEGATIVE mg/dL
Ketones, ur: NEGATIVE mg/dL
Leukocytes, UA: NEGATIVE
Nitrite: NEGATIVE
Specific Gravity, Urine: 1.02 (ref 1.005–1.030)
Urobilinogen, UA: 0.2 mg/dL (ref 0.0–1.0)
pH: 6 (ref 5.0–8.0)

## 2010-11-05 LAB — COMPREHENSIVE METABOLIC PANEL
ALT: 14 U/L (ref 0–35)
AST: 21 U/L (ref 0–37)
Albumin: 3.8 g/dL (ref 3.5–5.2)
Alkaline Phosphatase: 77 U/L (ref 39–117)
BUN: 7 mg/dL (ref 6–23)
CO2: 29 mEq/L (ref 19–32)
Calcium: 9.9 mg/dL (ref 8.4–10.5)
Chloride: 99 mEq/L (ref 96–112)
Creatinine, Ser: 0.69 mg/dL (ref 0.4–1.2)
GFR calc Af Amer: >60 mL/min (ref >60)
GFR calc non Af Amer: >60 mL/min (ref >60)
Glucose, Bld: 95 mg/dL (ref 70–99)
Potassium: 3.9 mEq/L (ref 3.5–5.1)
Sodium: 138 mEq/L (ref 135–145)
Total Bilirubin: 1.3 mg/dL — ABNORMAL HIGH (ref 0.3–1.2)
Total Protein: 8.1 g/dL (ref 6.0–8.3)

## 2010-11-05 LAB — URINE MICROSCOPIC-ADD ON

## 2010-11-05 LAB — CBC
HCT: 42.8 % (ref 36.0–46.0)
Hemoglobin: 14.5 g/dL (ref 12.0–15.0)
MCHC: 33.8 g/dL (ref 30.0–36.0)
MCV: 97.2 fL (ref 78.0–100.0)
Platelets: 233 10*3/uL (ref 150–400)
RBC: 4.4 MIL/uL (ref 3.87–5.11)
RDW: 14 % (ref 11.5–15.5)
WBC: 9.9 10*3/uL (ref 4.0–10.5)

## 2010-11-05 LAB — URINE CULTURE: Colony Count: 4000

## 2010-11-05 LAB — DIFFERENTIAL
Basophils Relative: 1 % (ref 0–1)
Lymphocytes Relative: 20 % (ref 12–46)
Monocytes Absolute: 0.6 10*3/uL (ref 0.1–1.0)
Monocytes Relative: 7 % (ref 3–12)
Neutro Abs: 7.1 10*3/uL (ref 1.7–7.7)

## 2010-11-07 LAB — CREATININE, SERUM
Creatinine, Ser: 0.57 mg/dL (ref 0.4–1.2)
GFR calc non Af Amer: >60 mL/min (ref >60)

## 2010-12-15 NOTE — Op Note (Signed)
[name], [name]              ACCOUNT NO.:  [id number]   MEDICAL RECORD NO.:  [id number]          PATIENT TYPE:  OBV   LOCATION:  A313                          FACILITY:  [facility]   PHYSICIAN:  Vickki Hearing, M.D.DATE OF BIRTH:  03-May-1961   DATE OF PROCEDURE:  01/29/2006  DATE OF DISCHARGE:                                 OPERATIVE REPORT   HISTORY:  50 year old female with left shoulder pain and some weakness,  decreased range of motion in the left shoulder.  Did not respond to  nonoperative measures, including injection, anti-inflammatories, oral  medications.   Presented for left shoulder rotator cuff repair after MRI showed the  supraspinatus tendon was torn.   PREOP DIAGNOSIS:  Torn rotator cuff left shoulder.   POSTOP DIAGNOSIS:  Torn rotator cuff left shoulder.   PROCEDURE:  Arthroscopy left shoulder, mini open rotator cuff repair.   SURGEON:  Vickki Hearing, M.D.   ASSISTANT:  Forde Dandy.   OPERATIVE FINDINGS:  There was a nonretracted 1-1/2 cm tear of the  supraspinatus tendon.  There was mild bursitis.  There was a sublabral  foramen at approximately 10 o'clock position on the left shoulder which was  not a tear.  Subscapularis looked fine.  Glenohumeral joint looked normal.  Biceps anchor looked good.  Axillary pouch was normal.   Subacromial space showed bursitis.  Fairly flat acromion did not require  acromioplasty.   The patient identified as Kimberly [name].  Her left shoulder is marked for  surgery, countersigned by the surgeon.  Antibiotics were started.  The  patient's chart was updated and history was updated.  Antibiotics were  given.  The patient was taken to the operating room for general anesthesia.   The anesthetic record will note that the patient's airway was abnormal and  it required assistance with a lighted instrument for intubation.   The patient was given Decadron to control swelling.   After successful intubation, the  patient was prepped and draped in sterile  technique, placed in a semi-recumbent, beach-chair modified position, and  her time-out was completed.   Through a posterior portal, diagnostic arthroscopy was done.  I have listed  the findings above.  We  placed the scope in the subacromial space, did a  bursectomy, found the tear anteriorly.  No retraction was noted in the  supraspinatus.   Through an anterior lateral cannula, we debrided the torn portion of the  cuff, decorticated the humerus, passed a suture anchor, and then performed a  mini open repair through a deltoid split, passed two sutures through the  cuff using the suture passers.   We then tied the cuff down through one simple suture, the other one was  through the tuberosity and bone, and got an excellent watertight repair.  We  irrigated the wound, closed the deltoid defect with 0-Monocryl, subcu with 2-  0 Monocryl, ran a 3-0 Prolene suture subcu and pushed 30 mL of Sensorcaine  in the subdeltoid space, applied sterile dressings and Cryo Cuff.  Extubated  the patient and sent to the recovery room in stable condition.  She will be  admitted because of the intubation difficulty to watch for any throat  swelling or edema overnight.  I have spoken to Dr. Jayme Cloud.  He says that  we should be okay with the Decadron that was given intra-op, as far as  swelling.  If she has any difficulty, we can always add more Decadron as  required.      Vickki Hearing, M.D.  Electronically Signed     [facility]/MEDQ  D:  01/29/2006  T:  01/29/2006  Job:  [id number]

## 2010-12-15 NOTE — H&P (Signed)
Kimberly [name], Kimberly [name]              ACCOUNT NO.:  [id number]   MEDICAL RECORD NO.:  [id number]          PATIENT TYPE:  AMB   LOCATION:  DAY                           FACILITY:  [facility]   PHYSICIAN:  Vickki Hearing, M.D.DATE OF BIRTH:  [birth year]   DATE OF ADMISSION:  DATE OF DISCHARGE:  LH                                HISTORY & PHYSICAL   CHIEF COMPLAINT:  Pain in the left shoulder.   This is a 50 year old female who was injured at work on September 28, 2005.  She  bent down, fell forward, twisted her arm, was sent to urgent care where she  was given some pain medicine and light duty for two weeks.  Eventually went  back to full duty.  In April of 2007 she injured the arm again at work and  eventually worker's compensation claim was denied.   She complains of severe pain in the left shoulder unresponsive to Vicodin  where as Vicodin initially worked.  She takes some Flexeril 10 mg once a  day.  Complains of throbbing all night long and radiation up into her neck.  Primarily pain is in the deltoid region and below.  She has painful internal  rotation and forward elevation.  She did receive a cortisone injection which  did give her some relief, but did not correct the weakness of the  supraspinatus tendon.   We have therefore recommend arthroscopic surgery for rotator cuff repair,  possible mini open technique.  She understands she may have some pain,  stiffness, and weakness after the surgery, will have to be out of work for  an extended period of time.  Six to 12 months will be required for  rehabilitation.  She understands if she does not have surgery she will  continue to have pain and may have progression of the rotator cuff tear.   REVIEW OF SYSTEMS:  Headache.  All other systems normal.   She is allergic to CODEINE, but takes Vicodin okay.   MEDICAL PROBLEMS:  Hypertension with hot flashes.   SURGICAL HISTORY:  Status post hysterectomy, foreign body removal left knee.   MEDICATIONS:  Norvasc, estradiol, Vicodin, Flexeril.   FAMILY HISTORY:  Negative.   Family physician listed as Dr. Sherwood Gambler.  She is married.  She is employed at  [employer].  Does a lot of pushing and pulling.  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  She does not smoke or drink.   PHYSICAL EXAMINATION:  VITAL SIGNS:  Weight 152, pulse 64, respiratory rate  18.  GENERAL APPEARANCE:  Normal.  She is well-developed and nourished.  Grooming  and hygiene are normal.  She is mesomorphic in body habitus.  CARDIOVASCULAR:  Normal pulses and perfusion.  Normal temperature.  No edema  or swelling.  LYMPH NODE:  Benign.  CERVICAL SPINE:  Benign.  CHEST:  Clear.  HEART RATE:  Rhythm normal.  ABDOMEN:  Soft.  MUSCULOSKELETAL:  There is some tenderness in the left side of her neck.  Range of motion is normal.  EXTREMITIES:  Left upper extremity has full  forward elevation, internal  rotation.  There is a five to seven level difference versus the opposite  right arm.  External rotation is normal.  She had pain in the thumbs down  position but strength in the rotator cuff seems to be intact.  Compensation  has occurred.  Her stability is normal.  Alignment is normal.  She is tender  at the anterolateral acromion and in the soft tissue just beneath it.   Right shoulder examination was completely normal.  Lower extremities were  well aligned without contracture, subluxation, atrophy, or tremor.  SKIN:  Intact without open sores.  NEURO/PSYCH:  She was awake, alert, and oriented x3 with pleasant mood.  Reflexes were 2+.  No sensory abnormalities.   Her MRI shows a torn rotator cuff with a type 3 acromion with tendinopathy  in the rotator cuff.  There were some arthritic changes of the [facility] joint and  cystic changes in the humeral head.   DIAGNOSES:  Rotator cuff tear with rotator cuff tendinopathy and AC joint  arthritis of the left shoulder.   PLAN:  Arthroscopy of the left  shoulder with rotator cuff repair plus or  minus mini open technique.      Vickki Hearing, M.D.  Electronically Signed     SE[facility]/MEDQ  D:  01/28/2006  T:  01/28/2006  Job:  [id number]   cc:   [facility]  Fax: [phone]   Madelin Rear. Sherwood Gambler, MD  Fax: [phone]

## 2011-03-22 ENCOUNTER — Emergency Department (HOSPITAL_COMMUNITY)
Admission: EM | Admit: 2011-03-22 | Discharge: 2011-03-22 | Disposition: A | Discharge status: Home / Self Care | Payer: BC Managed Care – PPO | Attending: Emergency Medicine | Admitting: Emergency Medicine

## 2011-03-22 ENCOUNTER — Emergency Department (HOSPITAL_COMMUNITY): Payer: BC Managed Care – PPO

## 2011-03-22 DIAGNOSIS — R404 Transient alteration of awareness: Secondary | ICD-10-CM | POA: Insufficient documentation

## 2011-03-22 DIAGNOSIS — Z91013 Allergy to seafood: Secondary | ICD-10-CM | POA: Insufficient documentation

## 2011-03-22 DIAGNOSIS — S0990XA Unspecified injury of head, initial encounter: Secondary | ICD-10-CM | POA: Insufficient documentation

## 2011-03-22 DIAGNOSIS — Z91010 Allergy to peanuts: Secondary | ICD-10-CM | POA: Insufficient documentation

## 2011-03-22 DIAGNOSIS — Z885 Allergy status to narcotic agent status: Secondary | ICD-10-CM | POA: Insufficient documentation

## 2011-03-22 DIAGNOSIS — Y92009 Unspecified place in unspecified non-institutional (private) residence as the place of occurrence of the external cause: Secondary | ICD-10-CM | POA: Insufficient documentation

## 2011-03-22 DIAGNOSIS — IMO0002 Reserved for concepts with insufficient information to code with codable children: Secondary | ICD-10-CM | POA: Insufficient documentation

## 2011-03-22 HISTORY — DX: Herpesviral infection, unspecified: B00.9

## 2011-03-22 HISTORY — DX: Essential (primary) hypertension: I10

## 2011-03-22 NOTE — ED Provider Notes (Signed)
History   Scribed for Kimberly Lennert, MD, the patient was seen in room APA19/AP[facility]19. This chart was scribed by Clarita Crane. This patient's care was started at 7:30AM.   CSN: [id number] Arrival date & time: 03/22/2011  7:23 AM  Chief Complaint  Patient presents with  . Head Injury   HPI Kimberly [name] is a 50 y.o. female who presents to the Emergency Department complaining of moderate head injury to left side of forehead with associated LOC which occurred this morning just PTA. Patient reports she had taken a thermos out of the freezer this morning and set it on the table when it suddenly "exploded" and the top of the thermos hit her in the head causing her to fall and have an episode of LOC. Patient now c/o constant  throbbing HA to left-side of forehead. Denies change in vision, neck pain, numbness, tingling.   HPI ELEMENTS: Head Injury Location: left side forehead  Onset: this morning- just prior to arrival   Severity: moderate Context:  as above  Associated symptoms: +LOC, HA.  Denies change in vision, neck pain, numbness, tingling.     PAST MEDICAL HISTORY:  Past Medical History  Diagnosis Date  . Hypertension   . COPD (chronic obstructive pulmonary disease)   . Herpes     PAST SURGICAL HISTORY:  Past Surgical History  Procedure Date  . Abdominal hysterectomy   . Rotator cuff repair     MEDICATIONS:  Previous Medications   AMLODIPINE (NORVASC) 5 MG TABLET    Take 5 mg by mouth daily.     AMLODIPINE BESYLATE (NORVASC PO)    Take by mouth.     ESTRADIOL (ESTRACE) 0.5 MG TABLET    Take 0.5 mg by mouth daily.     ESTRADIOL ACETATE PO    Take by mouth.     NAPROXEN SODIUM (ANAPROX) 220 MG TABLET    Take 220 mg by mouth every 8 (eight) hours as needed. For pain    VALACYCLOVIR (VALTREX) 500 MG TABLET    Take 500 mg by mouth 2 (two) times daily as needed. For outbreaks    VALACYCLOVIR HCL (VALTREX PO)    Take by mouth.       ALLERGIES:  Allergies as of 03/22/2011 -  Review Complete 03/22/2011  Allergen Reaction Noted  . Codeine Other (See Comments)   . Peanut-containing drug products Hives 03/22/2011  . Shellfish allergy Hives 03/22/2011     FAMILY HISTORY:  No family history on file.   SOCIAL HISTORY: History   Social History  . Marital Status: Married    Spouse Name: N/A    Number of Children: N/A  . Years of Education: N/A   Social History Main Topics  . Smoking status: Never Smoker   . Smokeless tobacco: None  . Alcohol Use: No  . Drug Use: No  . Sexually Active:    Other Topics Concern  . None   Social History Narrative  . None     Review of Systems  Constitutional: Negative for fatigue.  HENT: Negative for congestion, neck pain, sinus pressure and ear discharge.   Eyes: Negative for discharge and visual disturbance.  Respiratory: Negative for cough.   Cardiovascular: Negative for chest pain.  Gastrointestinal: Negative for abdominal pain and diarrhea.  Genitourinary: Negative for frequency and hematuria.  Musculoskeletal: Negative for back pain.  Skin: Negative for rash.  Neurological: Positive for headaches. Negative for seizures and numbness.       +LOC  Hematological: Negative.   Psychiatric/Behavioral: Negative for hallucinations.    Physical Exam  BP 139/104  Pulse 73  Temp(Src) 98 F (36.7 C) (Oral)  Resp 16  Ht 5\' 1"  (1.549 m)  Wt 147 lb (66.679 kg)  BMI 27.78 kg/m2  SpO2 99%  Physical Exam  Nursing note and vitals reviewed. Constitutional: She is oriented to person, place, and time. She appears well-developed and well-nourished. No distress.  HENT:  Head: Normocephalic and atraumatic.       Mild tenderness to left forehead.   Eyes: Conjunctivae and EOM are normal. No scleral icterus.  Neck: Neck supple.       Immobilized in C-Collar.   Cardiovascular: Normal rate and regular rhythm.  Exam reveals no gallop and no friction rub.   No murmur heard. Pulmonary/Chest: Effort normal. She has no  wheezes. She has no rales.  Abdominal: Soft. Bowel sounds are normal. She exhibits no distension. There is no tenderness.  Musculoskeletal: Normal range of motion. She exhibits no edema.  Neurological: She is alert and oriented to person, place, and time. Coordination normal.  Skin: Skin is warm and dry. No rash noted. No erythema.  Psychiatric: She has a normal mood and affect. Her behavior is normal.    ED Course  Procedures  OTHER DATA REVIEWED: Nursing notes, vital signs, and past medical records reviewed. Lab results reviewed and considered Imaging results reviewed and considered  DIAGNOSTIC STUDIES: Oxygen Saturation is 99% on room air, normal by my interpretation.    LABS / RADIOLOGY:  Ct Head Wo Contrast  03/22/2011  *RADIOLOGY REPORT*  Clinical Data: Fall, headache  CT HEAD WITHOUT CONTRAST,CT CERVICAL SPINE WITHOUT CONTRAST  Technique:  Contiguous axial images were obtained from the base of the skull through the vertex without contrast.,Technique: Multidetector CT imaging of the cervical spine was performed. Multiplanar CT image reconstructions were also generated.  Comparison: None.  Findings: Stable left basal ganglia perivascular cyst, image 15. No acute intracranial hemorrhage, mass lesion, midline shift, infarction, hydrocephalus, or extra-axial fluid collection.  Wallace Cullens- white matter differentiation is maintained.  Cisterns patent.  No cerebellar abnormality.  Orbits are symmetric.  Mastoids clear. Minimal left sphenoid mucosal thickening otherwise sinuses are clear.  IMPRESSION: Stable head CT.  No acute intracranial finding.  *RADIOLOGY REPORT*  Clinical Data:  Fall, headache, loss of consciousness  CT CERVICAL SPINE WITHOUT CONTRAST  Technique:  Multidetector CT imaging of the cervical spine was performed without intravenous contrast.  Multiplanar CT image reconstructions were also generated.  Comparison:  None.  Findings:  Congenital C2/3 fusion noted.  Normal alignment.  Mild  to moderate cervical degenerative changes spondylosis at C5-6, C6- 7, and C7-T1.  Facets aligned.  No fracture, compression deformity, or focal kyphosis.  Normal prevertebral soft tissues.  No soft tissue asymmetry in the neck.  Lung apices are clear.  IMPRESSION: No acute fracture or abnormality by CT.  Congenital C2-3 fusion  Lower cervical degenerative changes and spondylosis.  Original Report Authenticated By: Judie Petit. Ruel Favors, M.D.   Ct Cervical Spine Wo Contrast  03/22/2011  *RADIOLOGY REPORT*  Clinical Data: Fall, headache  CT HEAD WITHOUT CONTRAST,CT CERVICAL SPINE WITHOUT CONTRAST  Technique:  Contiguous axial images were obtained from the base of the skull through the vertex without contrast.,Technique: Multidetector CT imaging of the cervical spine was performed. Multiplanar CT image reconstructions were also generated.  Comparison: None.  Findings: Stable left basal ganglia perivascular cyst, image 15. No acute intracranial hemorrhage, mass lesion, midline shift, infarction,  hydrocephalus, or extra-axial fluid collection.  Wallace Cullens- white matter differentiation is maintained.  Cisterns patent.  No cerebellar abnormality.  Orbits are symmetric.  Mastoids clear. Minimal left sphenoid mucosal thickening otherwise sinuses are clear.  IMPRESSION: Stable head CT.  No acute intracranial finding.  *RADIOLOGY REPORT*  Clinical Data:  Fall, headache, loss of consciousness  CT CERVICAL SPINE WITHOUT CONTRAST  Technique:  Multidetector CT imaging of the cervical spine was performed without intravenous contrast.  Multiplanar CT image reconstructions were also generated.  Comparison:  None.  Findings:  Congenital C2/3 fusion noted.  Normal alignment.  Mild to moderate cervical degenerative changes spondylosis at C5-6, C6- 7, and C7-T1.  Facets aligned.  No fracture, compression deformity, or focal kyphosis.  Normal prevertebral soft tissues.  No soft tissue asymmetry in the neck.  Lung apices are clear.  IMPRESSION: No  acute fracture or abnormality by CT.  Congenital C2-3 fusion  Lower cervical degenerative changes and spondylosis.  Original Report Authenticated By: Judie Petit. Ruel Favors, M.D.    PROCEDURES:  ED COURSE / COORDINATION OF CARE: Orders Placed This Encounter  Procedures  . CT Head Wo Contrast  . CT Cervical Spine Wo Contrast  9:15AM- Patient and spouse informed of imaging results. Patient reports she walked with an unsteady gait when she got up to use the restroom. Patient denies dizziness when she was stood up. Upon re-examination, TM's normal bilaterally. Patient informed of d/c home. Patient and spouse agree with plan set forth   MDM: Differential Diagnosis:    PLAN: Discharge The patient is to return the emergency department if there is any worsening of symptoms. I have reviewed the discharge instructions with the patient/family  CONDITION ON DISCHARGE: Stable   MEDICATIONS GIVEN IN THE E.D.  Medications  ValACYclovir HCl (VALTREX PO) (not administered)  AmLODIPine Besylate (NORVASC PO) (not administered)  ESTRADIOL ACETATE PO (not administered)  naproxen sodium (ANAPROX) 220 MG tablet (not administered)  amLODipine (NORVASC) 5 MG tablet (not administered)  estradiol (ESTRACE) 0.5 MG tablet (not administered)  valACYclovir (VALTREX) 500 MG tablet (not administered)      The chart was scribed for me under my direct supervision.  I personally performed the history, physical, and medical decision making and all procedures in the evaluation of this patient.Kimberly Lennert, MD 03/22/11 [phone]

## 2011-03-22 NOTE — ED Notes (Signed)
Pt reports got thermos out of the freezer and when sat it on the table it "exploded" and top hit pt in the head.  Pt says she grabbed her head and sat down on the floor.  Says she thinks she passed out briefly.  Pt alert, oriented, grips strong and equal, pupils equal and reactive.  Pt has knot to right side of head.

## 2011-04-17 IMAGING — MR MR HEAD WO/W CM
10 of 14 series · 31 of 48 positions shown · IV contrast (multihance)
Comparison: MRI 06/07/2008 and CT head 12/16/2001.

CLINICAL DATA: Headache visual disturbance.  Follow-up cyst.

MRI HEAD WITHOUT AND WITH CONTRAST
TECHNIQUE: Multiplanar, multiecho pulse sequences of the brain and
surrounding structures were obtained according to standard protocol
without and with intravenous contrast
Contrast: 13 ml Multihance IV.

[Series 4: DWI · axial · 5.0mm · 0.72mm/px · z∈[-115,+12]mm · 2 of 21 slices shown (1 of 2)]
[im 1/21]
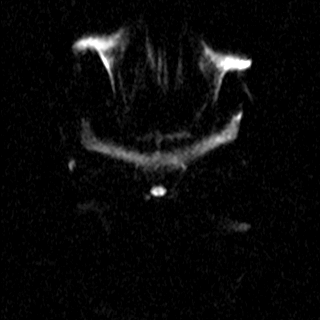
[im 21/21]
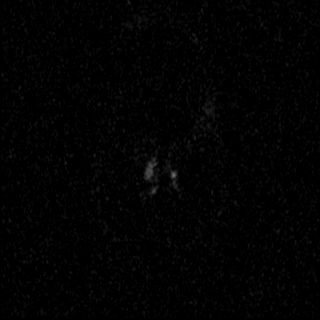

[Series 5: DWI · axial · 5.0mm · 0.72mm/px · z∈[-121,+12]mm · 2 of 22 slices shown (2 of 2)]
[im 1/22]
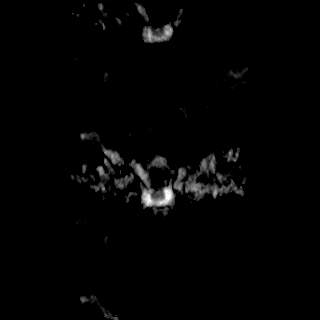
[im 22/22]
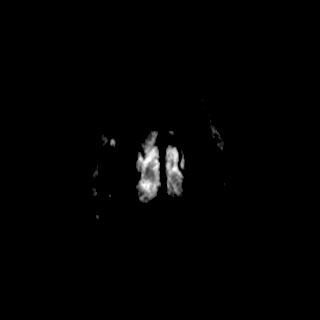

[Series 6: FLAIR · axial · 5.0mm · 0.94mm/px · z∈[-117,+20]mm · 2 of 24 slices shown (1 of 3)]
[im 1/24]
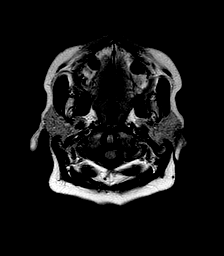
[im 24/24]
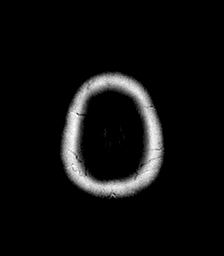

[Series 7: T2 · axial · 5.0mm · 0.62mm/px · z∈[-123,+26]mm · 3 of 26 slices shown (1 of 2)]
[im 1/26]
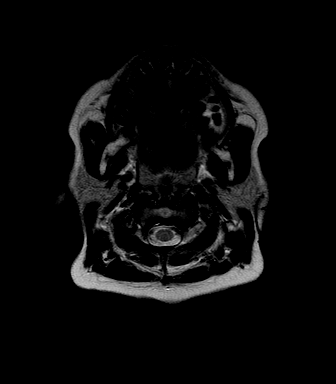
[im 13/26]
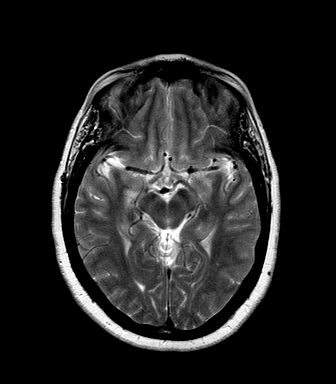
[im 26/26]
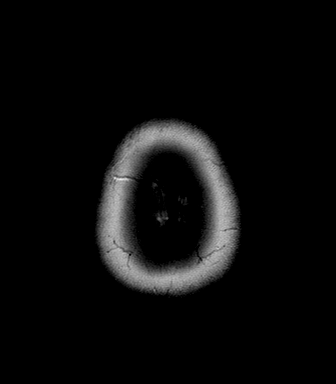

[Series 9: T2 · axial · 5.0mm · 0.47mm/px · z∈[-117,+20]mm · 2 of 24 slices shown (2 of 2)]
[im 1/24]
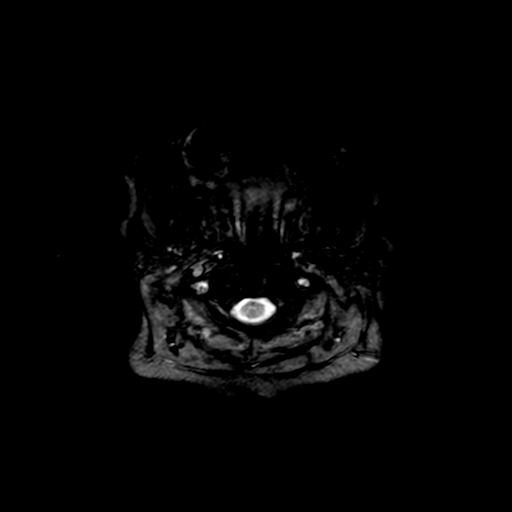
[im 24/24]
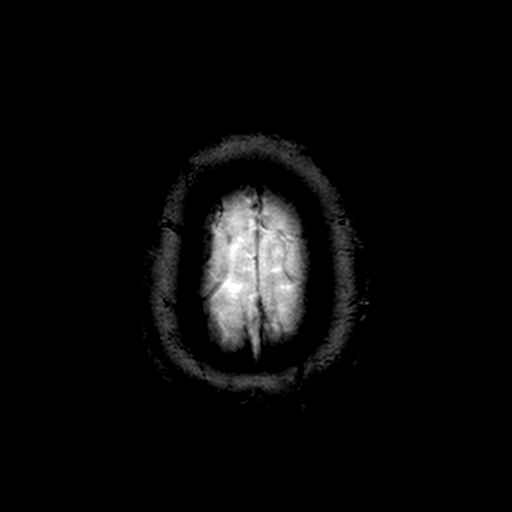

[Series 11: FLAIR · sagittal · 3.5mm · 0.47mm/px · 4 of 34 slices shown (2 of 3)]
[im 1/34]
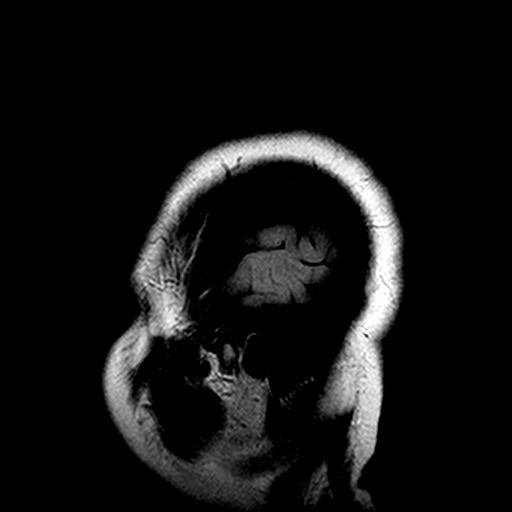
[im 12/34]
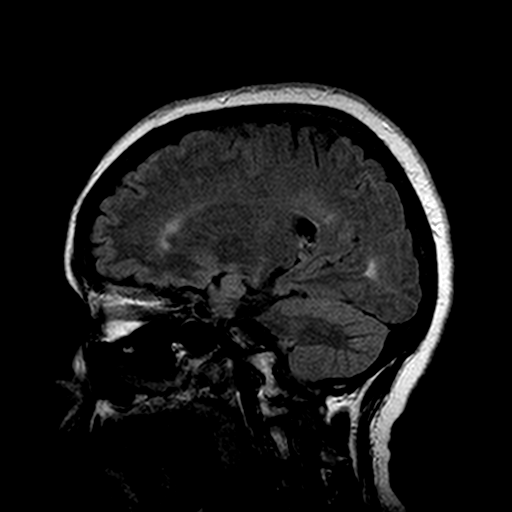
[im 23/34]
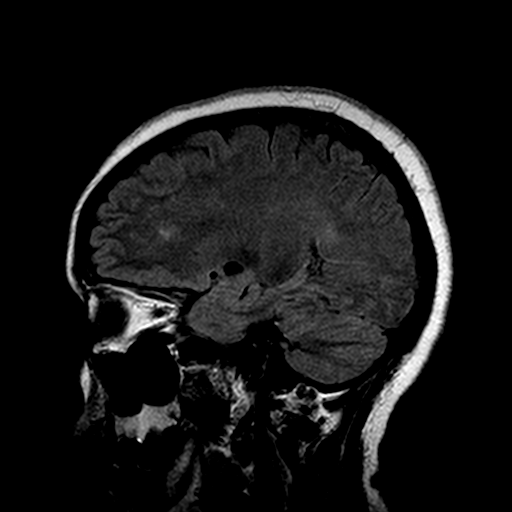
[im 34/34]
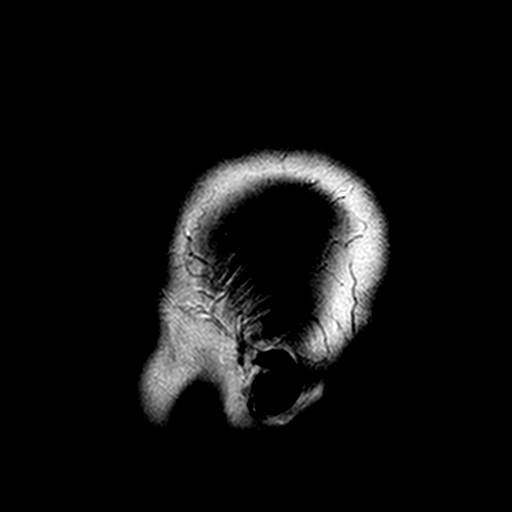

[Series 12: FLAIR · axial · 5.0mm · 0.94mm/px · z∈[-123,+26]mm · 3 of 26 slices shown (3 of 3)]
[im 1/26]
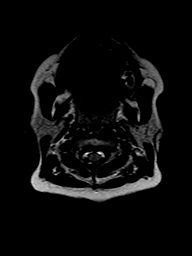
[im 13/26]
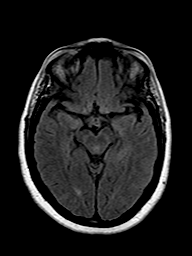
[im 26/26]
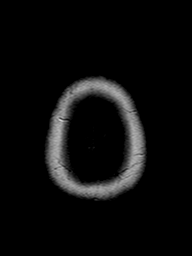

[Series 15: T1 post-contrast · axial · 2.0mm · 0.55mm/px · z∈[-108,+50]mm · 8 of 80 slices shown (1 of 3)]
[im 1/80]
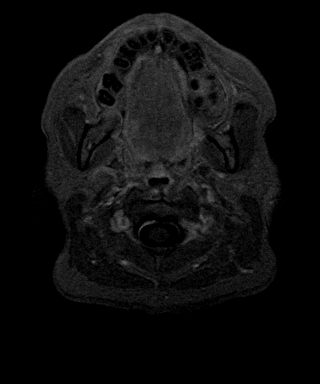
[im 12/80]
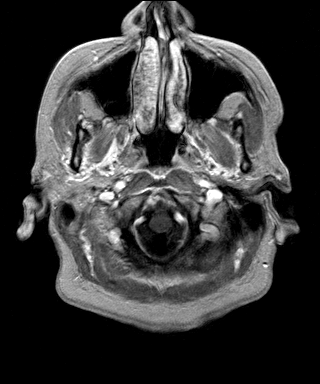
[im 23/80]
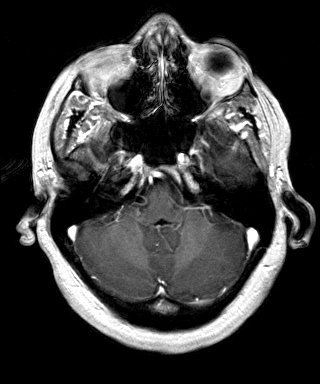
[im 34/80]
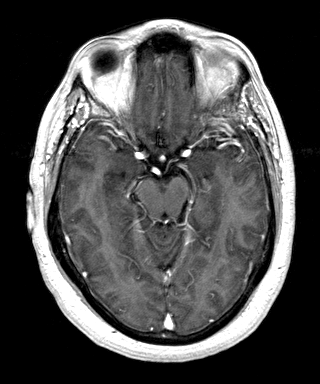
[im 46/80]
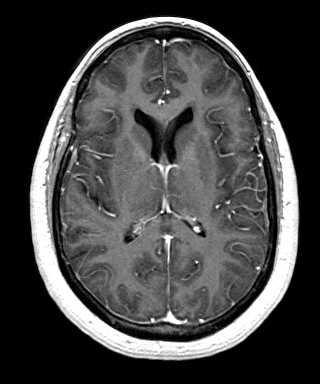
[im 57/80]
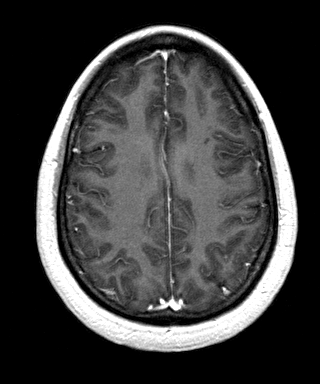
[im 68/80]
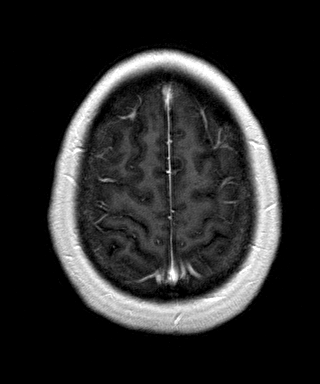
[im 80/80]
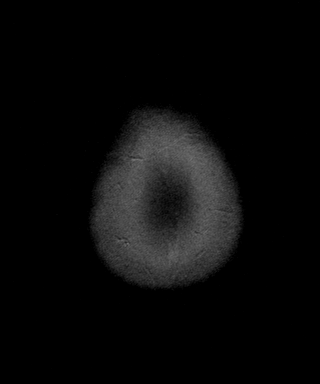

[Series 16: T1 post-contrast · coronal · 5.0mm · 0.45mm/px · 3 of 26 slices shown (2 of 3)]
[im 1/26]
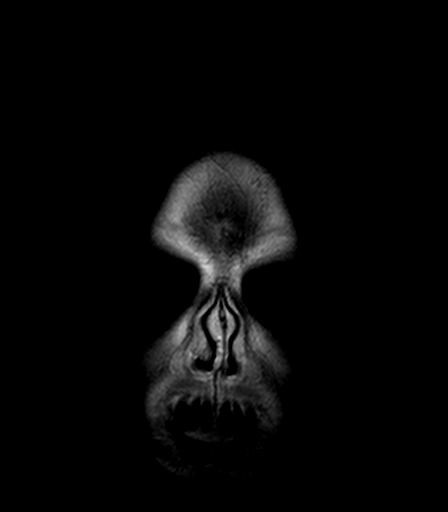
[im 13/26]
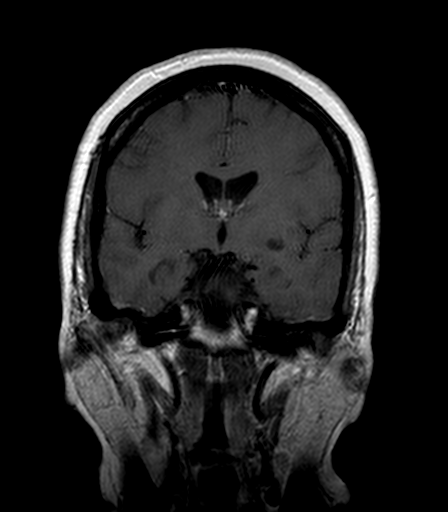
[im 26/26]
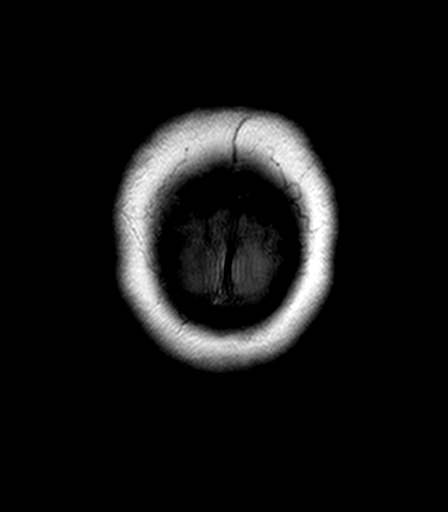

[Series 17: T1 post-contrast · sagittal · 5.0mm · 0.47mm/px · 2 of 22 slices shown (3 of 3)]
[im 1/22]
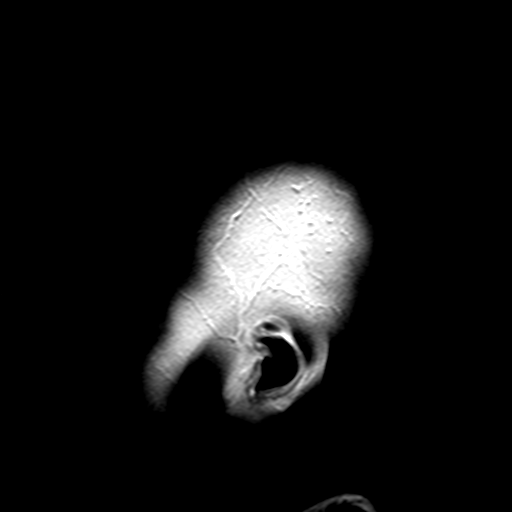
[im 22/22]
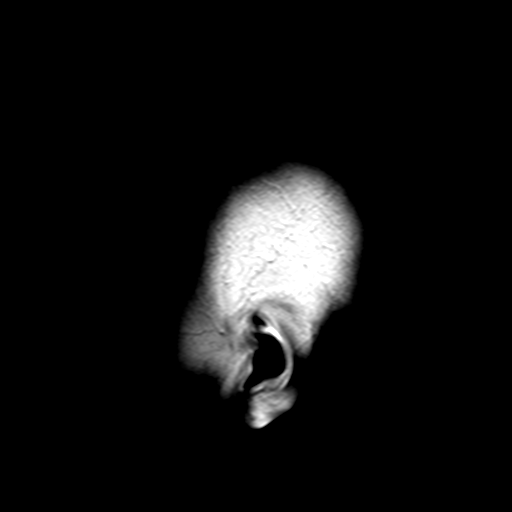

[31 of 48 positions shown; findings below may reference images not displayed]

FINDINGS: 9 x 10 mm cyst in the left basal ganglia is unchanged
from the prior MRI.  This does not show any surrounding signal
abnormality on the FLAIR sequence.  There is no   enhancement.
This lesion is also unchanged from the CT of 12/16/2001 indicating
stability over time.  This is compatible with a benign cyst.

Small hyperintensities are present in the frontal white matter
bilaterally which are unchanged from the prior MRI.  These are most
likely due to chronic ischemia.  Negative for acute infarct.  There
is no mass lesion.  There is a normal contrast enhancement pattern.
There is no hemorrhage.
IMPRESSION: 9 x 10 mm cyst in the left basal ganglia is stable going back to
4556.  This is not a benign finding and is most likely a
perivascular cyst.

Small white matter hyperintensities bilaterally are stable and
likely due to chronic ischemia.  This could also be seen with
migraine headaches or vasculitis.

No acute abnormality and no change from the  prior study.

## 2011-06-04 ENCOUNTER — Other Ambulatory Visit (HOSPITAL_COMMUNITY): Payer: Self-pay | Admitting: Internal Medicine

## 2011-06-04 DIAGNOSIS — Z139 Encounter for screening, unspecified: Secondary | ICD-10-CM

## 2011-06-11 ENCOUNTER — Ambulatory Visit (HOSPITAL_COMMUNITY)
Admission: RE | Admit: 2011-06-11 | Discharge: 2011-06-11 | Disposition: A | Discharge status: Home / Self Care | Payer: BC Managed Care – PPO | Source: Ambulatory Visit | Attending: Internal Medicine | Admitting: Internal Medicine

## 2011-06-11 DIAGNOSIS — Z1231 Encounter for screening mammogram for malignant neoplasm of breast: Secondary | ICD-10-CM | POA: Insufficient documentation

## 2011-06-11 DIAGNOSIS — Z139 Encounter for screening, unspecified: Secondary | ICD-10-CM

## 2011-07-02 ENCOUNTER — Other Ambulatory Visit (HOSPITAL_COMMUNITY): Payer: Self-pay | Admitting: Physician Assistant

## 2011-07-02 ENCOUNTER — Ambulatory Visit (HOSPITAL_COMMUNITY)
Admission: RE | Admit: 2011-07-02 | Discharge: 2011-07-02 | Disposition: A | Discharge status: Home / Self Care | Payer: BC Managed Care – PPO | Source: Ambulatory Visit | Attending: Physician Assistant | Admitting: Physician Assistant

## 2011-07-02 DIAGNOSIS — R05 Cough: Secondary | ICD-10-CM

## 2011-07-02 DIAGNOSIS — R0602 Shortness of breath: Secondary | ICD-10-CM

## 2011-07-02 DIAGNOSIS — R059 Cough, unspecified: Secondary | ICD-10-CM | POA: Insufficient documentation

## 2011-07-17 IMAGING — CR DG CHEST 2V
2 series · 2 of 2 positions shown · non-contrast
Comparison: 04/02/2006

CLINICAL DATA: Chest pain

CHEST - 2 VIEW

[view not recorded (1 of 2)]
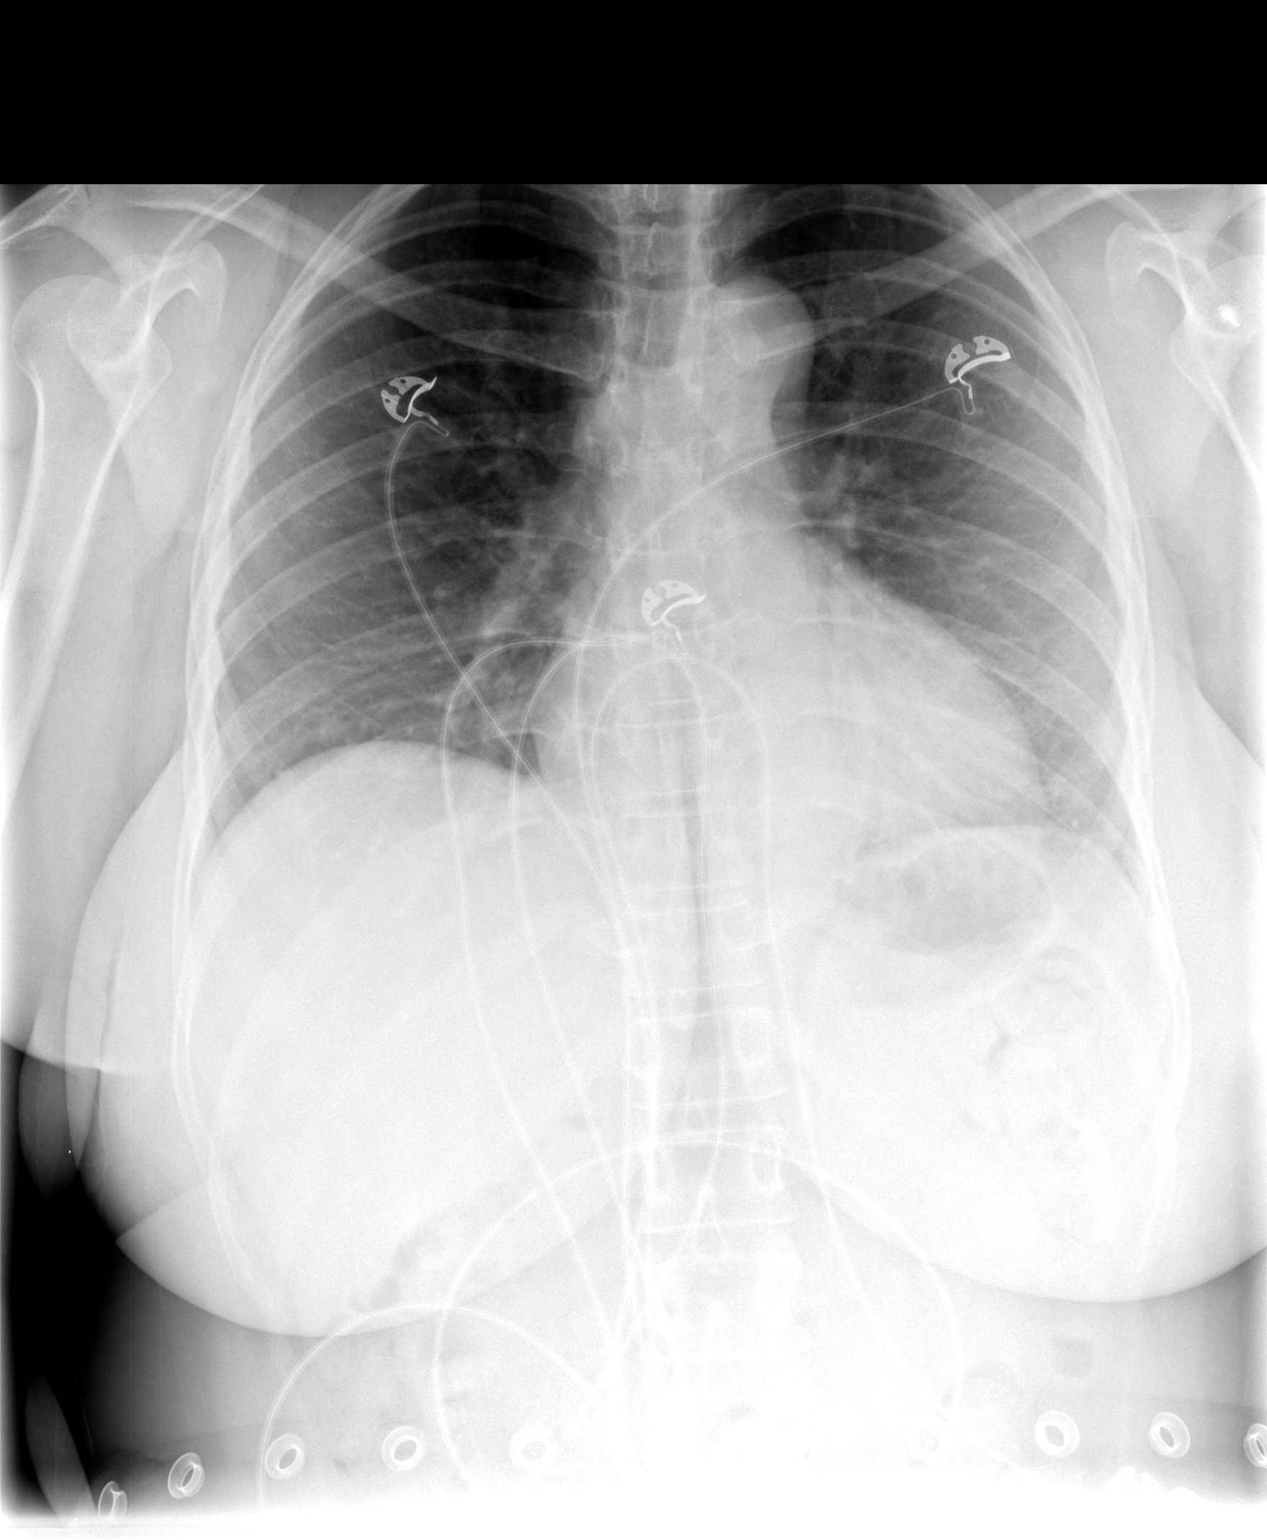

[view not recorded (2 of 2)]
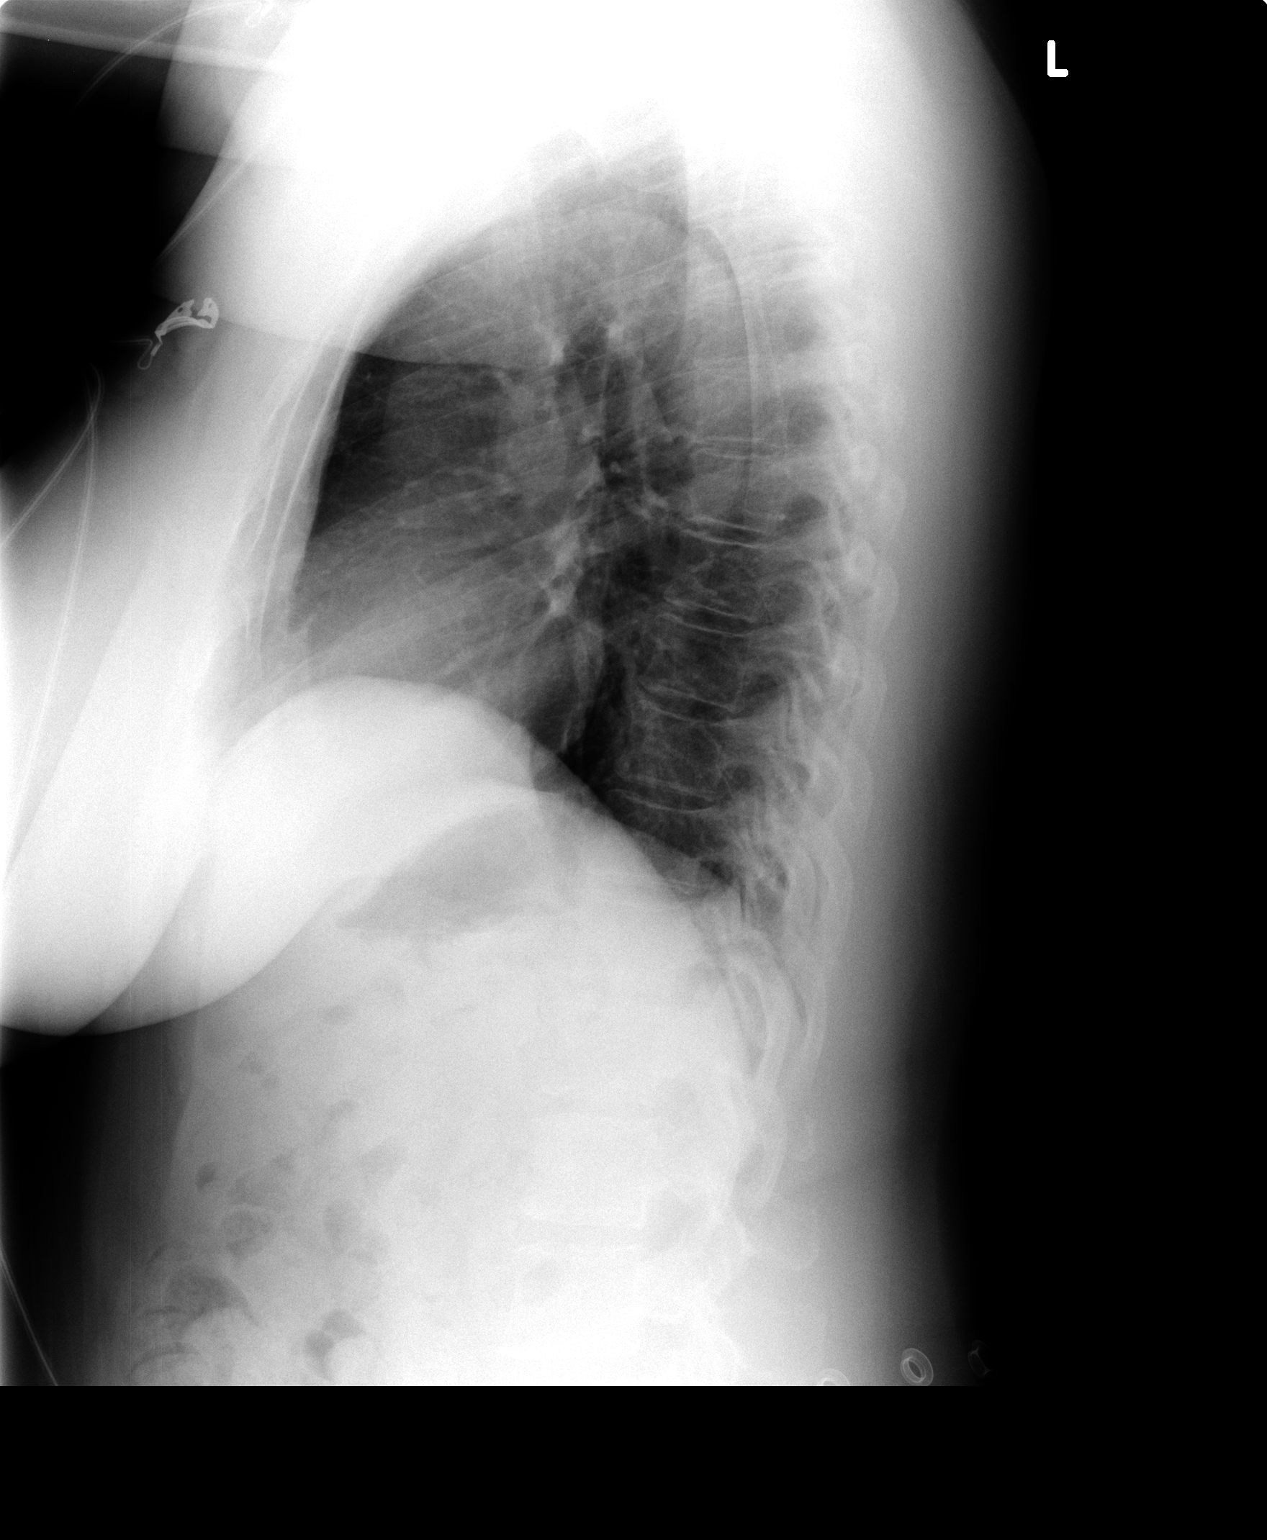

[2 of 2 positions shown; findings below may reference images not displayed]

FINDINGS: The cardiomediastinal silhouette is unremarkable.
The lungs are clear.
No evidence of focal airspace disease, pleural effusion, or
pneumothorax.
No acute bony abnormalities are identified.
IMPRESSION: No evidence of acute cardiopulmonary disease.

## 2011-09-11 IMAGING — MG MM DIGITAL SCREENING
4 series · 4 of 4 positions shown · non-contrast
Comparison: none

DG SCREEN MAMMOGRAM BILATERAL
Bilateral CC and MLO view(s) were taken.

DIGITAL SCREENING MAMMOGRAM WITH CAD:
The breast tissue is heterogeneously dense.  No masses or malignant type calcifications are 
identified.  Compared with prior studies.
Images were processed with CAD.

[L CC]
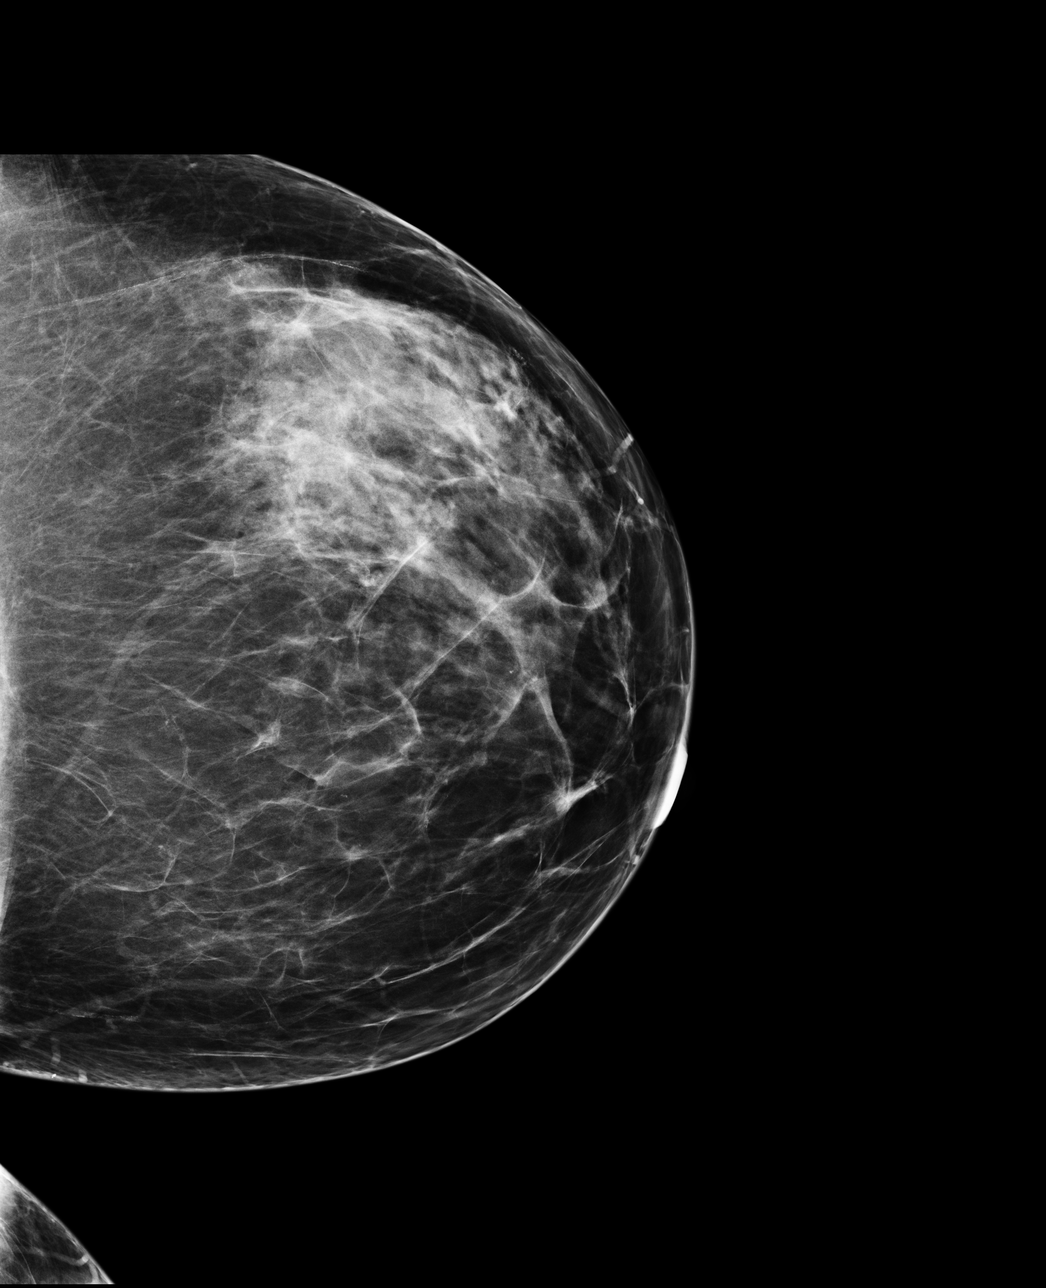

[L MLO]
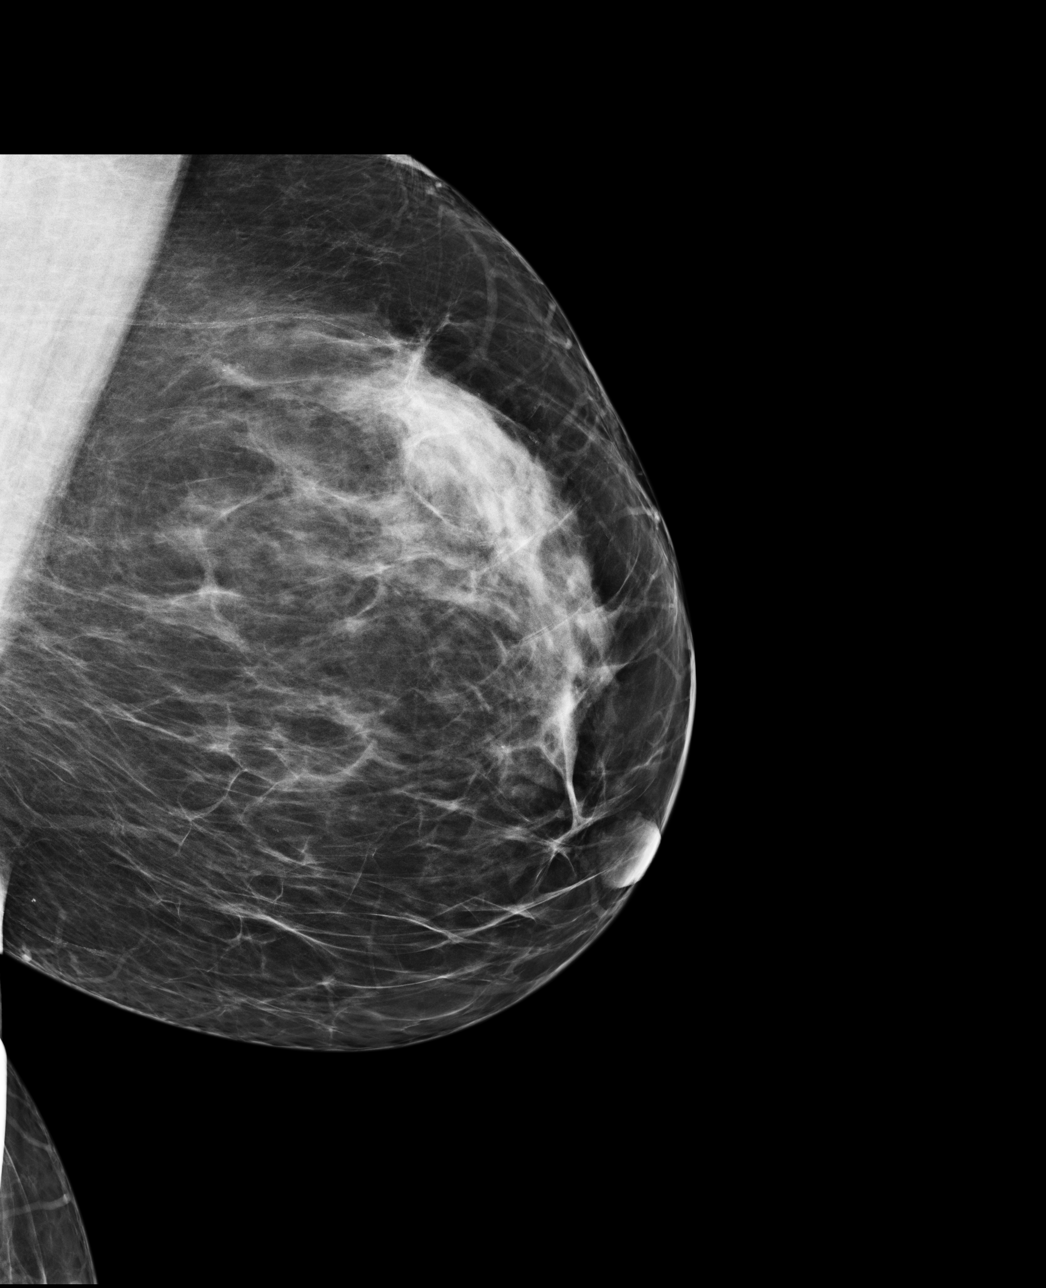

[R CC]
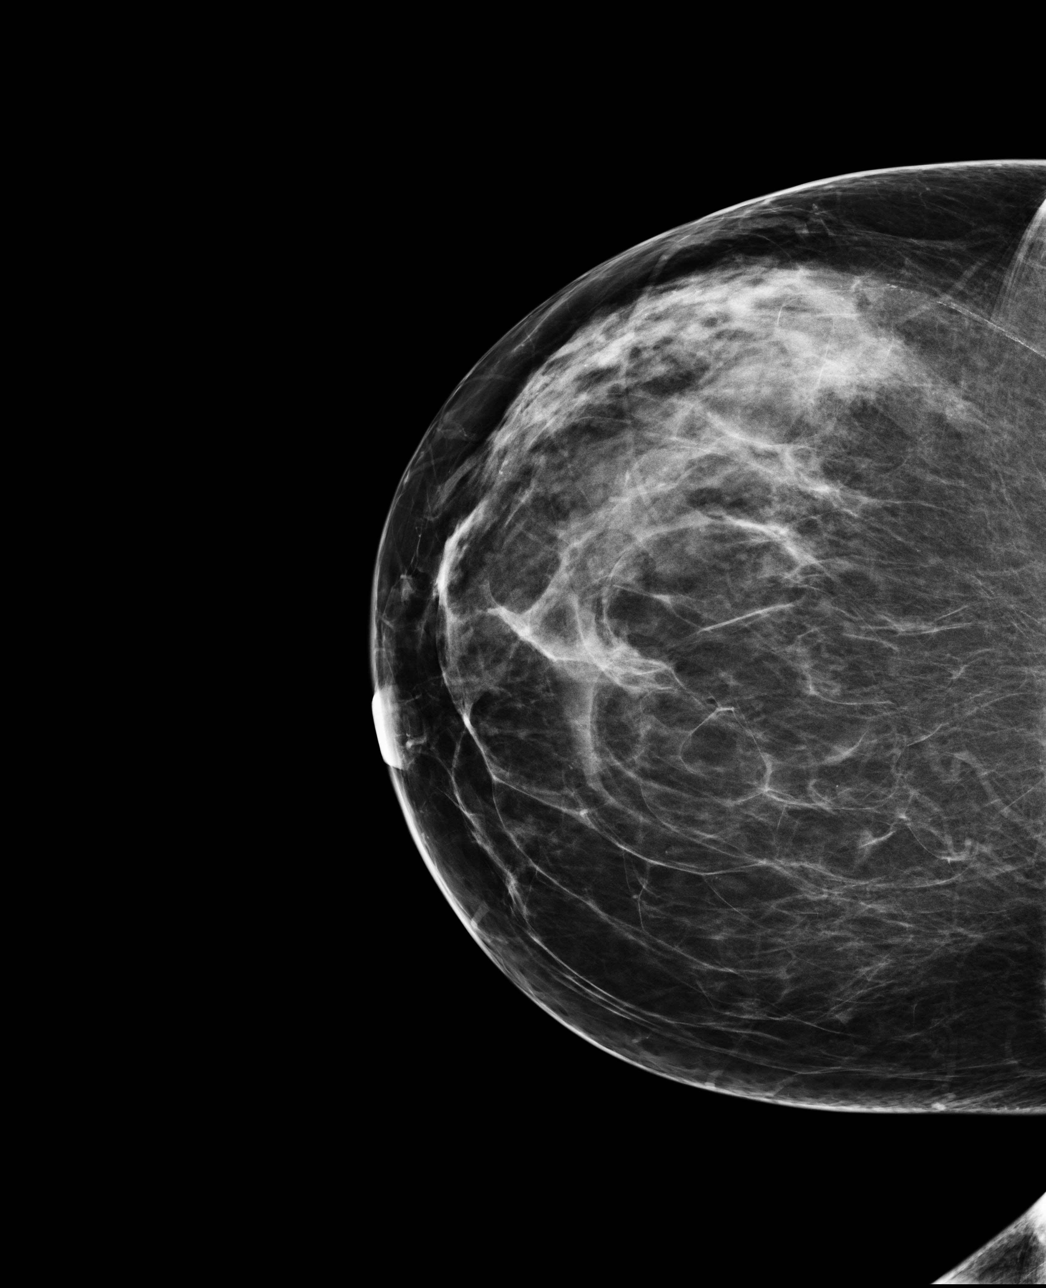

[R MLO]
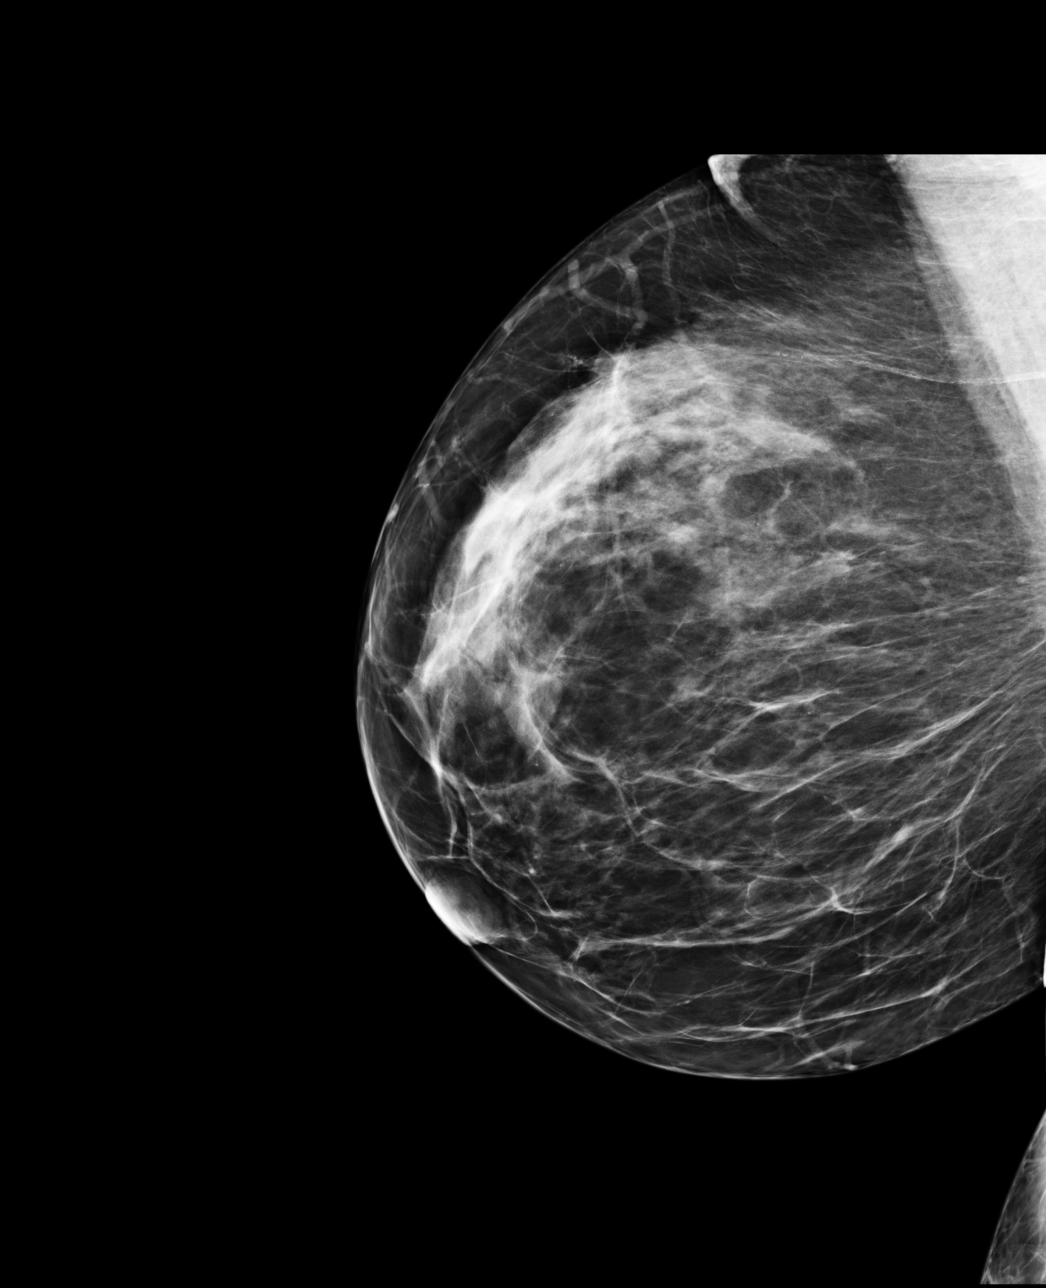

[4 of 4 positions shown; findings below may reference images not displayed]

IMPRESSION: No specific mammographic evidence of malignancy.  Next screening mammogram is recommended in one 
year.

A result letter of this screening mammogram will be mailed directly to the patient.

ASSESSMENT: Negative - BI-RADS 1

Screening mammogram in 1 year.
,

## 2011-10-12 ENCOUNTER — Ambulatory Visit (HOSPITAL_COMMUNITY)
Admission: RE | Admit: 2011-10-12 | Discharge: 2011-10-12 | Disposition: A | Discharge status: Home / Self Care | Payer: BC Managed Care – PPO | Source: Ambulatory Visit | Attending: Physician Assistant | Admitting: Physician Assistant

## 2011-10-12 ENCOUNTER — Other Ambulatory Visit (HOSPITAL_COMMUNITY): Payer: Self-pay | Admitting: Physician Assistant

## 2011-10-12 DIAGNOSIS — R918 Other nonspecific abnormal finding of lung field: Secondary | ICD-10-CM | POA: Insufficient documentation

## 2011-10-12 DIAGNOSIS — R079 Chest pain, unspecified: Secondary | ICD-10-CM | POA: Insufficient documentation

## 2011-10-22 ENCOUNTER — Other Ambulatory Visit (HOSPITAL_COMMUNITY): Payer: Self-pay | Admitting: Internal Medicine

## 2011-10-22 DIAGNOSIS — J159 Unspecified bacterial pneumonia: Secondary | ICD-10-CM

## 2011-10-29 ENCOUNTER — Other Ambulatory Visit (HOSPITAL_COMMUNITY): Payer: Self-pay | Admitting: Urology

## 2011-10-29 DIAGNOSIS — R3129 Other microscopic hematuria: Secondary | ICD-10-CM

## 2011-11-01 ENCOUNTER — Ambulatory Visit (HOSPITAL_COMMUNITY)
Admission: RE | Admit: 2011-11-01 | Discharge: 2011-11-01 | Payer: BC Managed Care – PPO | Source: Ambulatory Visit | Attending: Urology | Admitting: Urology

## 2011-11-02 ENCOUNTER — Ambulatory Visit (HOSPITAL_COMMUNITY)
Admission: RE | Admit: 2011-11-02 | Discharge: 2011-11-02 | Disposition: A | Discharge status: Home / Self Care | Payer: BC Managed Care – PPO | Source: Ambulatory Visit | Attending: Urology | Admitting: Urology

## 2011-11-02 DIAGNOSIS — N83209 Unspecified ovarian cyst, unspecified side: Secondary | ICD-10-CM | POA: Insufficient documentation

## 2011-11-02 DIAGNOSIS — N200 Calculus of kidney: Secondary | ICD-10-CM | POA: Insufficient documentation

## 2011-11-02 DIAGNOSIS — R3129 Other microscopic hematuria: Secondary | ICD-10-CM | POA: Insufficient documentation

## 2011-11-02 DIAGNOSIS — Z90710 Acquired absence of both cervix and uterus: Secondary | ICD-10-CM | POA: Insufficient documentation

## 2011-11-02 MED ORDER — IOHEXOL 300 MG/ML  SOLN
125.0000 mL | Freq: Once | INTRAMUSCULAR | Status: AC | PRN
  Administered 2011-11-02: 125 mL via INTRAVENOUS

## 2011-11-29 ENCOUNTER — Other Ambulatory Visit: Payer: Self-pay

## 2011-11-29 DIAGNOSIS — R0602 Shortness of breath: Secondary | ICD-10-CM

## 2011-12-03 ENCOUNTER — Ambulatory Visit (HOSPITAL_COMMUNITY)
Admission: RE | Admit: 2011-12-03 | Discharge: 2011-12-03 | Disposition: A | Discharge status: Home / Self Care | Payer: BC Managed Care – PPO | Source: Ambulatory Visit | Attending: Pulmonary Disease | Admitting: Pulmonary Disease

## 2011-12-03 DIAGNOSIS — R0602 Shortness of breath: Secondary | ICD-10-CM | POA: Insufficient documentation

## 2011-12-03 LAB — BLOOD GAS, ARTERIAL
Acid-Base Excess: 2.8 mmol/L — ABNORMAL HIGH (ref 0.0–2.0)
Bicarbonate: 26.6 mEq/L — ABNORMAL HIGH (ref 20.0–24.0)
O2 Saturation: 96.5 %
Patient temperature: 37
TCO2: 23.4 mmol/L (ref 0–100)

## 2011-12-05 NOTE — Procedures (Signed)
[name], [name]              ACCOUNT NO.:  [id number]  MEDICAL RECORD NO.:  [id number]  LOCATION:                                 FACILITY:  PHYSICIAN:  Kimberly [name], M.D.DATE OF BIRTH:  1960/09/20  DATE OF PROCEDURE:  12/04/2011 DATE OF DISCHARGE:                           PULMONARY FUNCTION TEST   Reason for pulmonary function testing is shortness of breath.  1. Spirometry shows no ventilatory defect and no airflow obstruction. 2. Lung volumes are essentially normal with minimal reduction of total     lung capacity. 3. DLCO is mildly reduced. 4. Airway resistance is slightly elevated, which may indicate some     airflow obstruction.     [name], M.D.     [facility]/MEDQ  D:  12/04/2011  T:  12/04/2011  Job:  [id number]

## 2011-12-11 ENCOUNTER — Ambulatory Visit (HOSPITAL_COMMUNITY)
Admission: RE | Admit: 2011-12-11 | Discharge: 2011-12-11 | Disposition: A | Discharge status: Home / Self Care | Payer: BC Managed Care – PPO | Source: Ambulatory Visit | Attending: Internal Medicine | Admitting: Internal Medicine

## 2011-12-11 DIAGNOSIS — R0602 Shortness of breath: Secondary | ICD-10-CM | POA: Insufficient documentation

## 2011-12-11 DIAGNOSIS — J159 Unspecified bacterial pneumonia: Secondary | ICD-10-CM

## 2011-12-11 LAB — PULMONARY FUNCTION TEST

## 2011-12-19 ENCOUNTER — Encounter (HOSPITAL_COMMUNITY): Payer: Self-pay | Admitting: *Deleted

## 2011-12-19 ENCOUNTER — Emergency Department (HOSPITAL_COMMUNITY)
Admission: EM | Admit: 2011-12-19 | Discharge: 2011-12-19 | Disposition: A | Discharge status: Home / Self Care | Payer: BC Managed Care – PPO | Attending: Emergency Medicine | Admitting: Emergency Medicine

## 2011-12-19 ENCOUNTER — Emergency Department (HOSPITAL_COMMUNITY): Payer: BC Managed Care – PPO

## 2011-12-19 DIAGNOSIS — I10 Essential (primary) hypertension: Secondary | ICD-10-CM | POA: Insufficient documentation

## 2011-12-19 DIAGNOSIS — R279 Unspecified lack of coordination: Secondary | ICD-10-CM | POA: Insufficient documentation

## 2011-12-19 DIAGNOSIS — R070 Pain in throat: Secondary | ICD-10-CM | POA: Insufficient documentation

## 2011-12-19 DIAGNOSIS — R51 Headache: Secondary | ICD-10-CM | POA: Insufficient documentation

## 2011-12-19 DIAGNOSIS — J449 Chronic obstructive pulmonary disease, unspecified: Secondary | ICD-10-CM | POA: Insufficient documentation

## 2011-12-19 DIAGNOSIS — R21 Rash and other nonspecific skin eruption: Secondary | ICD-10-CM | POA: Insufficient documentation

## 2011-12-19 DIAGNOSIS — R5381 Other malaise: Secondary | ICD-10-CM | POA: Insufficient documentation

## 2011-12-19 DIAGNOSIS — R0602 Shortness of breath: Secondary | ICD-10-CM | POA: Insufficient documentation

## 2011-12-19 DIAGNOSIS — J4489 Other specified chronic obstructive pulmonary disease: Secondary | ICD-10-CM | POA: Insufficient documentation

## 2011-12-19 DIAGNOSIS — Z79899 Other long term (current) drug therapy: Secondary | ICD-10-CM | POA: Insufficient documentation

## 2011-12-19 DIAGNOSIS — R269 Unspecified abnormalities of gait and mobility: Secondary | ICD-10-CM | POA: Insufficient documentation

## 2011-12-19 DIAGNOSIS — R270 Ataxia, unspecified: Secondary | ICD-10-CM

## 2011-12-19 LAB — DIFFERENTIAL
Basophils Relative: 0 % (ref 0–1)
Eosinophils Absolute: 0.1 10*3/uL (ref 0.0–0.7)
Lymphs Abs: 2.7 10*3/uL (ref 0.7–4.0)
Monocytes Absolute: 0.7 10*3/uL (ref 0.1–1.0)
Monocytes Relative: 6 % (ref 3–12)

## 2011-12-19 LAB — URINALYSIS, ROUTINE W REFLEX MICROSCOPIC
Bilirubin Urine: NEGATIVE
Glucose, UA: NEGATIVE mg/dL
Ketones, ur: NEGATIVE mg/dL
Leukocytes, UA: NEGATIVE
Nitrite: NEGATIVE
Protein, ur: NEGATIVE mg/dL
Specific Gravity, Urine: 1.015 (ref 1.005–1.030)
Urobilinogen, UA: 0.2 mg/dL (ref 0.0–1.0)
pH: 7 (ref 5.0–8.0)

## 2011-12-19 LAB — COMPREHENSIVE METABOLIC PANEL
Albumin: 3.6 g/dL (ref 3.5–5.2)
BUN: 8 mg/dL (ref 6–23)
Chloride: 102 mEq/L (ref 96–112)
Creatinine, Ser: 0.59 mg/dL (ref 0.50–1.10)
GFR calc Af Amer: >90 mL/min (ref >90)
Glucose, Bld: 101 mg/dL — ABNORMAL HIGH (ref 70–99)
Total Bilirubin: 0.4 mg/dL (ref 0.3–1.2)

## 2011-12-19 LAB — CBC
HCT: 41.9 % (ref 36.0–46.0)
Hemoglobin: 13.7 g/dL (ref 12.0–15.0)
MCH: 30.5 pg (ref 26.0–34.0)
MCHC: 32.7 g/dL (ref 30.0–36.0)

## 2011-12-19 LAB — URINE MICROSCOPIC-ADD ON

## 2011-12-19 MED ORDER — GADOBENATE DIMEGLUMINE 529 MG/ML IV SOLN
14.0000 mL | Freq: Once | INTRAVENOUS | Status: AC | PRN
  Administered 2011-12-19: 14 mL via INTRAVENOUS

## 2011-12-19 MED ORDER — SODIUM CHLORIDE 0.9 % IV SOLN
INTRAVENOUS | Status: DC
  Administered 2011-12-19: 15:00:00 via INTRAVENOUS

## 2011-12-19 NOTE — Discharge Instructions (Signed)
Followup with your regular Dr. and your neurologist today's workup was extensive and without any specific findings. Head CT was negative an MRI of the brain was negative not clear the cause of ataxia but she can followup with neurology is safe to go home. Return for any new worse symptoms.

## 2011-12-19 NOTE — ED Provider Notes (Signed)
History   This chart was scribed for Shelda Jakes, MD by Clarita Crane. The patient was seen in room APA07/APA07. Patient's care was started at 1145.    CSN: [id number]  Arrival date & time 12/19/11  1145   First MD Initiated Contact with Patient 12/19/11 1314      Chief Complaint  Patient presents with  . Weakness    (Consider location/radiation/quality/duration/timing/severity/associated sxs/prior treatment) HPI Kimberly [name] is a 51 y.o. female who presents to the Emergency Department complaining of generalized weakness with associated staggering gait and mild HA onset yesterday after experiencing an episode of SOB and persistent since. Patient notes episode of SOB was moderately relieved with use of albuterol inhaler. Denies dizziness, visual changes, fever, numbness, dysuria. Patient was evaluated by a physician yesterday for similar complaint and prescribed medication but states she has been unable to obtain medication to this point. 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 Patient with h/o HTN, COPD, abdominal hysterectomy and is a non-smoker.   Additionally, patient c/o mild to moderate sore throat onset 3 months ago and persistent since. Denies congestion, cough, rhinorrhea, difficulty swallowing.  PCP- Sherwood Gambler  Past Medical History  Diagnosis Date  . Hypertension   . COPD (chronic obstructive pulmonary disease)   . Herpes     Past Surgical History  Procedure Date  . Abdominal hysterectomy   . Rotator cuff repair     No family history on file.  History  Substance Use Topics  . Smoking status: Never Smoker   . Smokeless tobacco: Not on file  . Alcohol Use: No    OB History    Grav Para Term Preterm Abortions TAB SAB Ect Mult Living                  Review of Systems  Constitutional: Negative for fever.  HENT: Positive for sore throat. Negative for rhinorrhea  and trouble swallowing.   Eyes: Negative for pain and visual disturbance.  Respiratory: Positive for shortness of breath. Negative for cough.   Cardiovascular: Negative for chest pain.  Gastrointestinal: Negative for nausea, vomiting, abdominal pain and diarrhea.  Genitourinary: Negative for dysuria.  Musculoskeletal: Negative for back pain.  Skin: Positive for rash.  Neurological: Positive for weakness and headaches.       Staggering gait    Allergies  Peanut-containing drug products; Shellfish allergy; and Codeine  Home Medications   Current Outpatient Rx  Name Route Sig Dispense Refill  . AMLODIPINE BESYLATE 5 MG PO TABS Oral Take 5 mg by mouth daily.    . BUDESONIDE-FORMOTEROL FUMARATE 160-4.5 MCG/ACT IN AERO Inhalation Inhale 2 puffs into the lungs 2 (two) times daily.    Marland Kitchen ESTRADIOL 0.5 MG PO TABS Oral Take 0.5 mg by mouth daily.      Marland Kitchen NAPROXEN SODIUM 220 MG PO TABS Oral Take 220 mg by mouth every 8 (eight) hours as needed. For pain     . VALACYCLOVIR HCL 500 MG PO TABS Oral Take 500 mg by mouth 2 (two) times daily as needed. For outbreaks       BP 118/83  Pulse 84  Temp(Src) 98.3 F (36.8 C) (Oral)  Resp 18  Ht 5\' 1"  (1.549 m)  Wt 146 lb (66.225 kg)  BMI 27.59 kg/m2  SpO2 95%  Physical Exam  Nursing note and vitals reviewed. Constitutional: She is oriented to person, place, and  time. She appears well-developed and well-nourished. No distress.  HENT:  Head: Normocephalic and atraumatic.  Mouth/Throat: Oropharynx is clear and moist.       Mucous membranes moist.   Eyes: EOM are normal. Pupils are equal, round, and reactive to light.  Neck: Neck supple. No tracheal deviation present.  Cardiovascular: Normal rate and regular rhythm.  Exam reveals no gallop and no friction rub.   No murmur heard. Pulmonary/Chest: Effort normal. No respiratory distress. She has no wheezes. She has no rales.  Abdominal: Soft. Bowel sounds are normal. She exhibits no distension. There  is no tenderness.  Musculoskeletal: Normal range of motion. She exhibits no edema.  Lymphadenopathy:    She has no cervical adenopathy.  Neurological: She is alert and oriented to person, place, and time. No cranial nerve deficit or sensory deficit. Coordination normal.       Distal neurovascular intact. No pronator drift. Upper extremity strength normal and equal bilaterally.   Skin: Skin is warm and dry. No rash noted.  Psychiatric: She has a normal mood and affect. Her behavior is normal.    ED Course  Procedures (including critical care time)  DIAGNOSTIC STUDIES: Oxygen Saturation is 100% on room air, normal by my interpretation.    COORDINATION OF CARE: 1:45PM-Patient informed of current plan for treatment and evaluation and agrees with plan at this time.     Labs Reviewed  COMPREHENSIVE METABOLIC PANEL - Abnormal; Notable for the following:    Glucose, Bld 101 (*)    All other components within normal limits  URINALYSIS, ROUTINE W REFLEX MICROSCOPIC - Abnormal; Notable for the following:    Hgb urine dipstick TRACE (*)    All other components within normal limits  URINE MICROSCOPIC-ADD ON - Abnormal; Notable for the following:    Squamous Epithelial / LPF MANY (*)    All other components within normal limits  CBC  DIFFERENTIAL   Dg Chest 2 View  12/19/2011  *RADIOLOGY REPORT*  Clinical Data: Unsteady gait for 2 days.  Congestion.  CHEST - 2 VIEW  Comparison: 12/11/2011.  Findings: Minimal basilar scarring appears stable.  There is no confluent airspace opacity, edema or pleural effusion.  Heart size and mediastinal contours are normal.  IMPRESSION: Stable examination.  No acute cardiopulmonary process.  Original Report Authenticated By: Gerrianne Scale, M.D.   Ct Head Wo Contrast  12/19/2011  *RADIOLOGY REPORT*  Clinical Data: Generalized weakness with unsteady gait and headache.  CT HEAD WITHOUT CONTRAST  Technique:  Contiguous axial images were obtained from the base  of the skull through the vertex without contrast.  Comparison: Head CT 03/22/2011.  MRI brain 11/29/2008.  Findings: There is a stable prominent perivascular space in the left lentiform nucleus.  There are stable low density lesions in the frontal periventricular white matter bilaterally.  No acute intracranial hemorrhage, mass lesion, brain edema or extra-axial fluid collection is seen.  There is no evidence of cortical infarct.  The visualized paranasal sinuses remain clear.  Calvarium is intact.  IMPRESSION: Stable chronic frontal periventricular white matter disease.  No acute intracranial findings.  Original Report Authenticated By: Gerrianne Scale, M.D.   Mr Laqueta Jean Wo Contrast  12/19/2011  *RADIOLOGY REPORT*  Clinical Data: Gait problem.  Leg weakness.  MRI HEAD WITHOUT AND WITH CONTRAST  Technique:  Multiplanar, multiecho pulse sequences of the brain and surrounding structures were obtained according to standard protocol without and with intravenous contrast  Contrast: 14mL MULTIHANCE GADOBENATE DIMEGLUMINE 529 MG/ML  IV SOLN  Comparison: MRI 11/29/2008  Findings: Negative for acute infarct.  Scattered small white matter hyperintensities are unchanged from the  prior study and most compatible with chronic microvascular ischemia.  Benign cyst in the left basal ganglia is stable.  Negative for mass or edema.  No hemorrhage is present.  Brainstem is normal.  Vessels at the base of  the brain are patent.  Postcontrast imaging of the brain reveals normal enhancement.  No enhancing mass lesion is identified.  IMPRESSION: Mild chronic microvascular ischemia in the white matter.  No acute infarct or mass.  Original Report Authenticated By: Camelia Phenes, M.D.   Results for orders placed during the hospital encounter of 12/19/11  CBC      Component Value Range   WBC 10.5  4.0 - 10.5 (K/uL)   RBC 4.49  3.87 - 5.11 (MIL/uL)   Hemoglobin 13.7  12.0 - 15.0 (g/dL)   HCT 16.1  09.6 - 04.5 (%)   MCV 93.3  78.0  - 100.0 (fL)   MCH 30.5  26.0 - 34.0 (pg)   MCHC 32.7  30.0 - 36.0 (g/dL)   RDW 40.9  81.1 - 91.4 (%)   Platelets 276  150 - 400 (K/uL)  DIFFERENTIAL      Component Value Range   Neutrophils Relative 66  43 - 77 (%)   Neutro Abs 6.9  1.7 - 7.7 (K/uL)   Lymphocytes Relative 26  12 - 46 (%)   Lymphs Abs 2.7  0.7 - 4.0 (K/uL)   Monocytes Relative 6  3 - 12 (%)   Monocytes Absolute 0.7  0.1 - 1.0 (K/uL)   Eosinophils Relative 1  0 - 5 (%)   Eosinophils Absolute 0.1  0.0 - 0.7 (K/uL)   Basophils Relative 0  0 - 1 (%)   Basophils Absolute 0.0  0.0 - 0.1 (K/uL)  COMPREHENSIVE METABOLIC PANEL      Component Value Range   Sodium 139  135 - 145 (mEq/L)   Potassium 4.6  3.5 - 5.1 (mEq/L)   Chloride 102  96 - 112 (mEq/L)   CO2 27  19 - 32 (mEq/L)   Glucose, Bld 101 (*) 70 - 99 (mg/dL)   BUN 8  6 - 23 (mg/dL)   Creatinine, Ser 7.82  0.50 - 1.10 (mg/dL)   Calcium 95.6  8.4 - 10.5 (mg/dL)   Total Protein 8.0  6.0 - 8.3 (g/dL)   Albumin 3.6  3.5 - 5.2 (g/dL)   AST 16  0 - 37 (U/L)   ALT 13  0 - 35 (U/L)   Alkaline Phosphatase 79  39 - 117 (U/L)   Total Bilirubin 0.4  0.3 - 1.2 (mg/dL)   GFR calc non Af Amer >90  >90 (mL/min)   GFR calc Af Amer >90  >90 (mL/min)  URINALYSIS, ROUTINE W REFLEX MICROSCOPIC      Component Value Range   Color, Urine YELLOW  YELLOW    APPearance CLEAR  CLEAR    Specific Gravity, Urine 1.015  1.005 - 1.030    pH 7.0  5.0 - 8.0    Glucose, UA NEGATIVE  NEGATIVE (mg/dL)   Hgb urine dipstick TRACE (*) NEGATIVE    Bilirubin Urine NEGATIVE  NEGATIVE    Ketones, ur NEGATIVE  NEGATIVE (mg/dL)   Protein, ur NEGATIVE  NEGATIVE (mg/dL)   Urobilinogen, UA 0.2  0.0 - 1.0 (mg/dL)   Nitrite NEGATIVE  NEGATIVE    Leukocytes, UA NEGATIVE  NEGATIVE   URINE MICROSCOPIC-ADD ON      Component Value Range   Squamous Epithelial / LPF MANY (*) RARE    WBC, UA 0-2  <3 (WBC/hpf)   RBC / HPF 0-2  <3 (RBC/hpf)   Bacteria, UA RARE  RARE      Date: 12/19/2011  Rate: 87   Rhythm: normal sinus rhythm  QRS Axis: normal  Intervals: normal  ST/T Wave abnormalities: normal  Conduction Disutrbances:none  Narrative Interpretation:   Old EKG Reviewed: none available    1. Ataxia       MDM  Patient already followed by neurology but has been talking about having ataxia since yesterday after a shortness of breath event with COPD still has shortness of breath today but there is no wheezing chest x-ray is negative she is known to have a history of COPD. Ataxia though on neuro exam no definite coordination abnormality however head CT was negative MRI of the brain was negative as well patient can followup with her neurologist and her primary care Dr.      I personally performed the services described in this documentation, which was scribed in my presence. The recorded information has been reviewed and considered.     Shelda Jakes, MD 12/19/11 [phone]

## 2011-12-19 NOTE — ED Notes (Addendum)
Pt states that she was walking at work yesterday when she became unsteady on her feet when she stood up to attempt to walk to get her inhaler.  pt denies any pain, states "I just felt weird and confused", pt states that she still "weird", was seen by Dr. Juanetta Gosling yesterday, pt states that she still is having problems walking,states that she is weaker on the left side, no problems with speech, speech clear, no facial drooping noted, pt states that she is having problems breathing, states that the sob is what she was seen at Dr. Juanetta Gosling office for, pt states that she did not tell Dr. Juanetta Gosling about the left side being weaker yesterday,

## 2012-03-06 IMAGING — CR DG CHEST 2V
2 series · 2 of 2 positions shown · non-contrast
Comparison: 02/28/2009

CLINICAL DATA: Chest pain

CHEST - 2 VIEW

[view not recorded (1 of 2)]
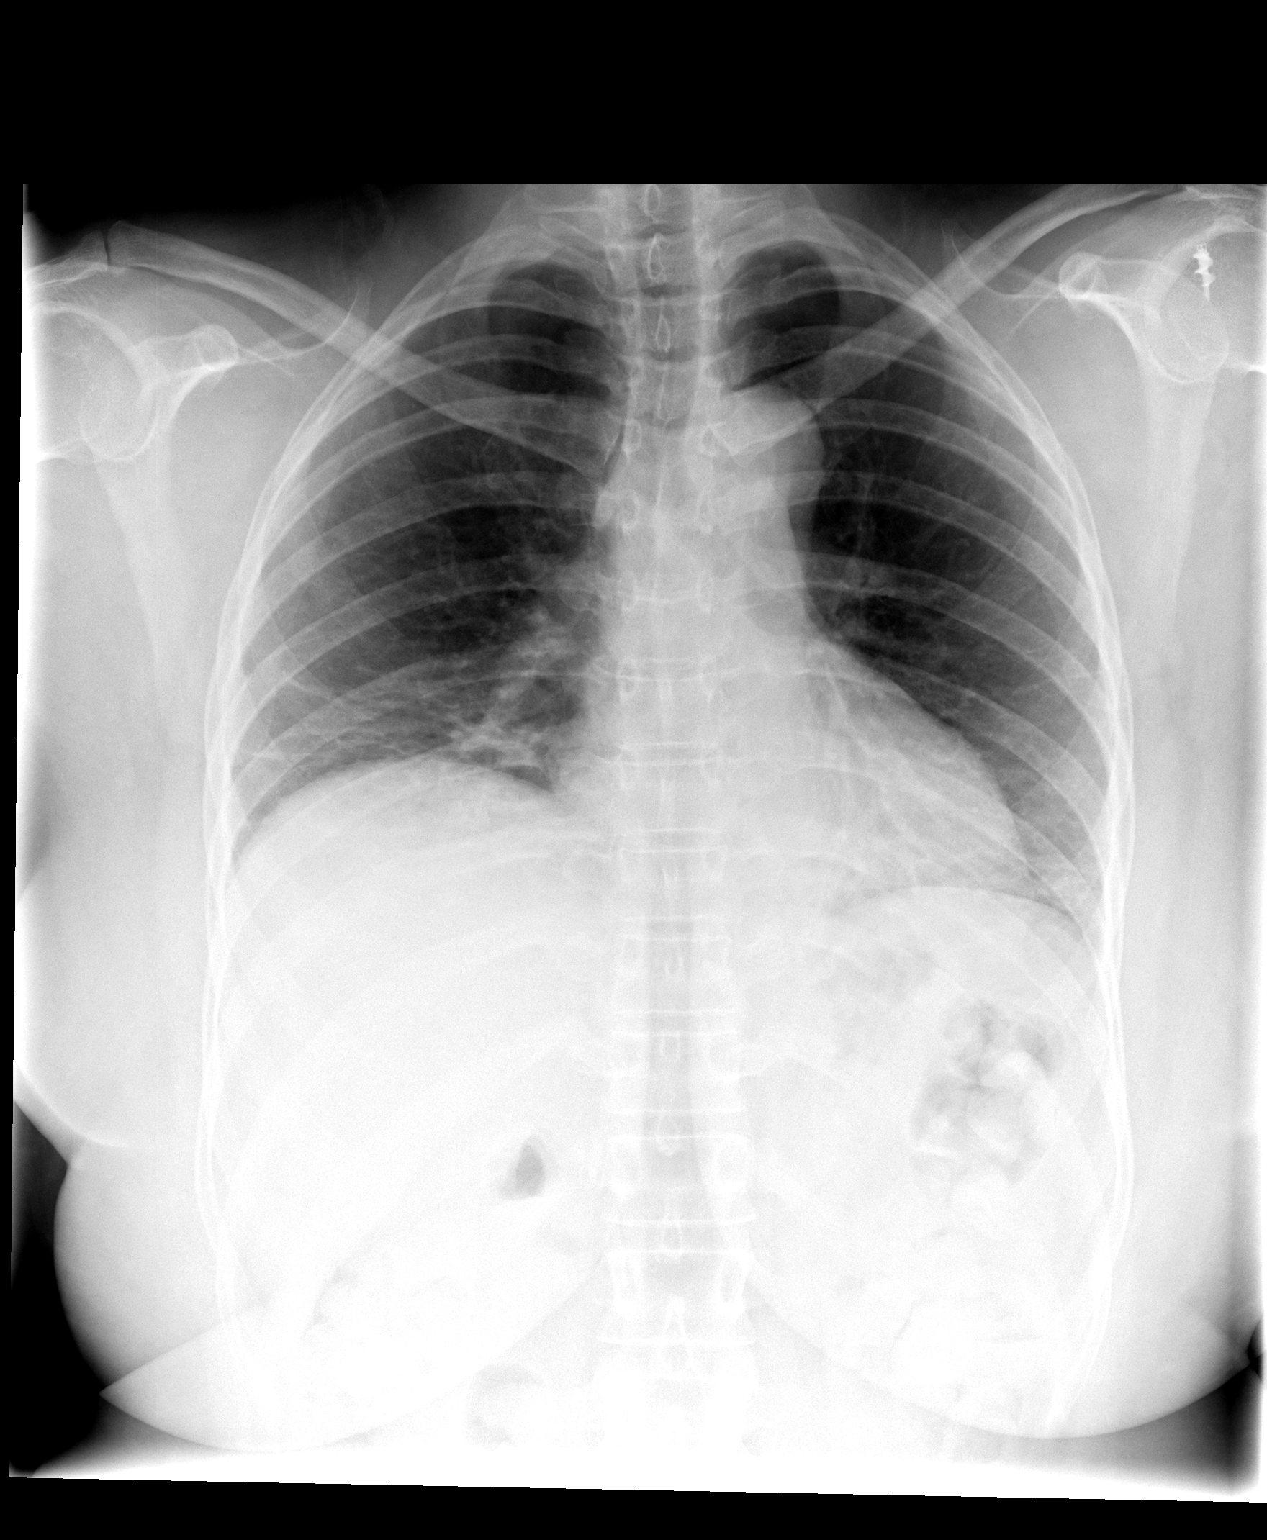

[view not recorded (2 of 2)]
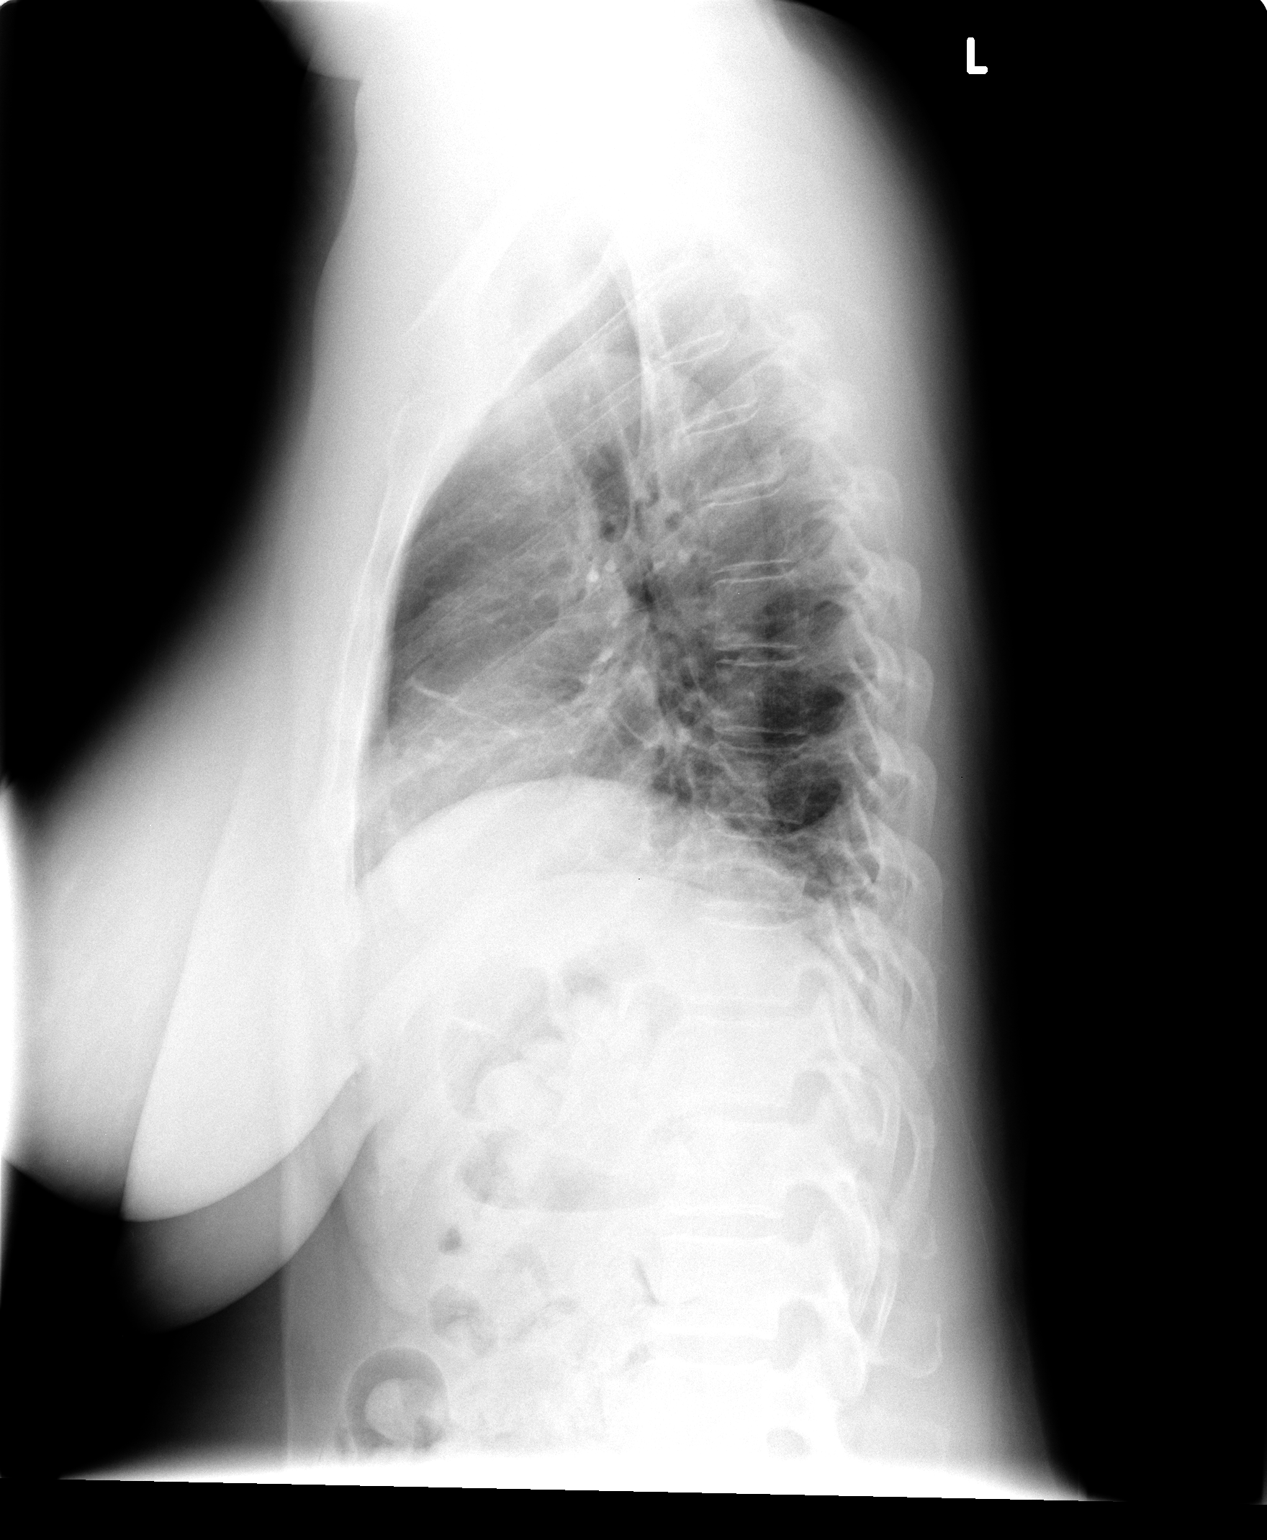

[2 of 2 positions shown; findings below may reference images not displayed]

FINDINGS: Heart size within normal limits.  The level of
inspiration is suboptimal.  There is mild bibasilar linear
atelectasis or scarring, which was not present previously.  No
airspace consolidation or pleural fluid.  Osseous structures
intact.
IMPRESSION: Mild bibasilar linear atelectasis or scarring - new finding.  No
evidence for consolidation or pleural fluid.

## 2012-05-04 IMAGING — CT CT ABD-PELV W/ CM
2 of 5 series · 17 of 46 positions shown, 19 images · IV contrast (Omnipaque 300)
Comparison: 05/24/2003 by report only

CLINICAL DATA: Right-sided abdominal pain, hematuria, vomiting.
Previous hysterectomy.

CT ABDOMEN AND PELVIS WITH CONTRAST
TECHNIQUE: Multidetector CT imaging of the abdomen and pelvis was
performed following the standard protocol during bolus
administration of intravenous contrast.
Contrast: 100 ml Rmnipaque-SVV IV

[Series 2: abd_pel_with 5.0 b40f · axial · 0.71mm/px · z∈[-399,-9]mm · 14 of 90 slices shown, 16 images]
[im 6/90  soft-tissue]
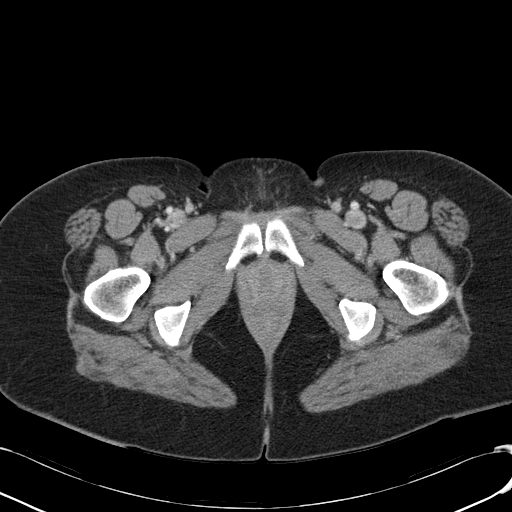
[im 6/90  bone]
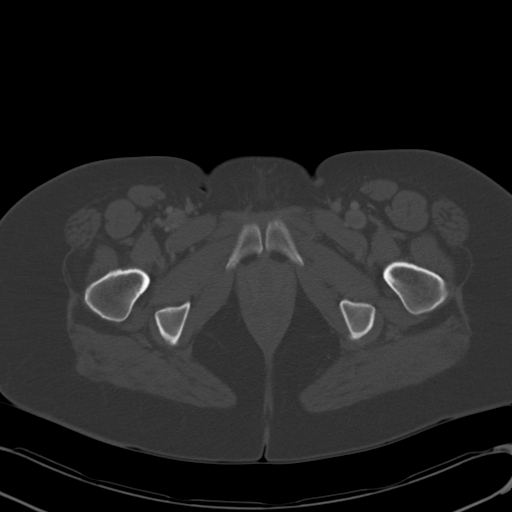
[im 12/90  soft-tissue]
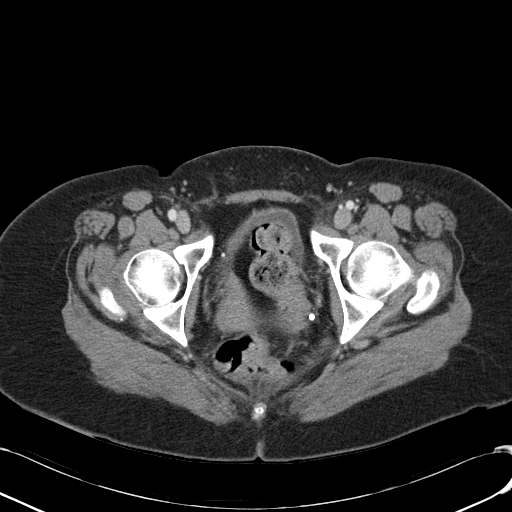
[im 17/90  soft-tissue]
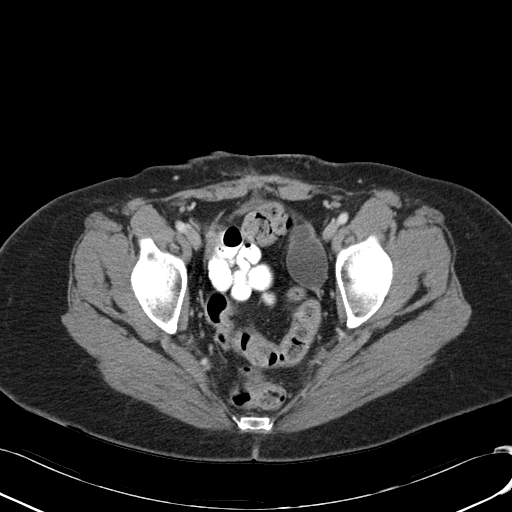
[im 23/90  soft-tissue]
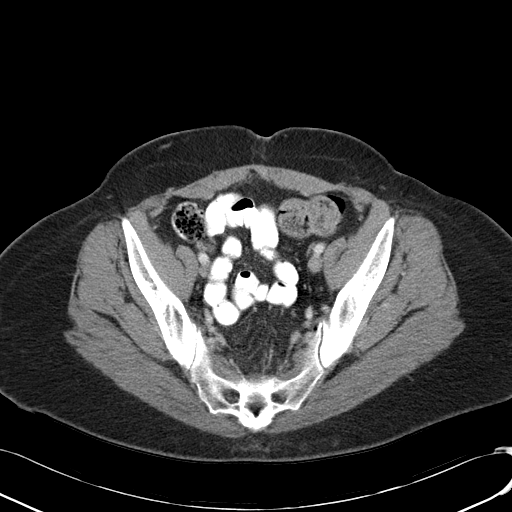
[im 28/90  soft-tissue]
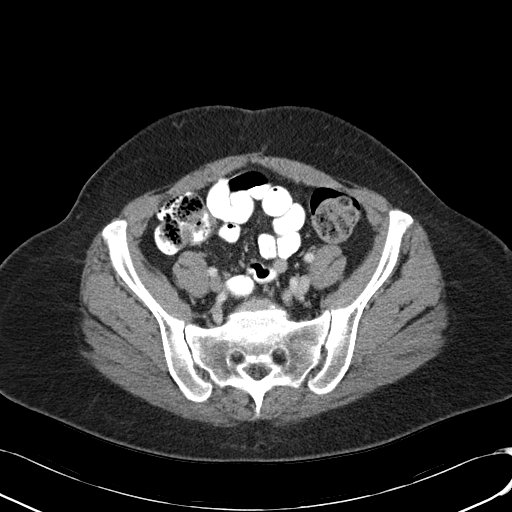
[im 34/90  soft-tissue]
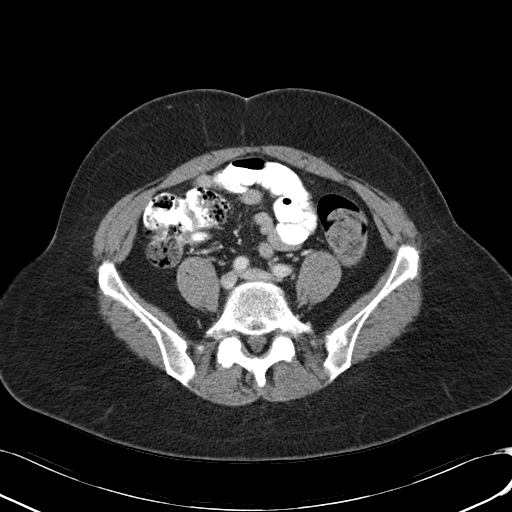
[im 39/90  soft-tissue]
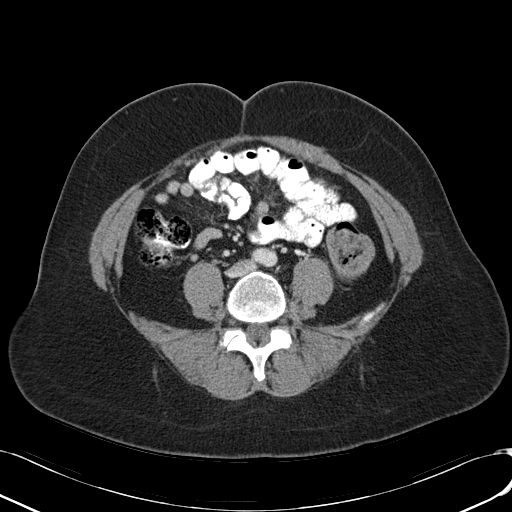
[im 51/90  soft-tissue]
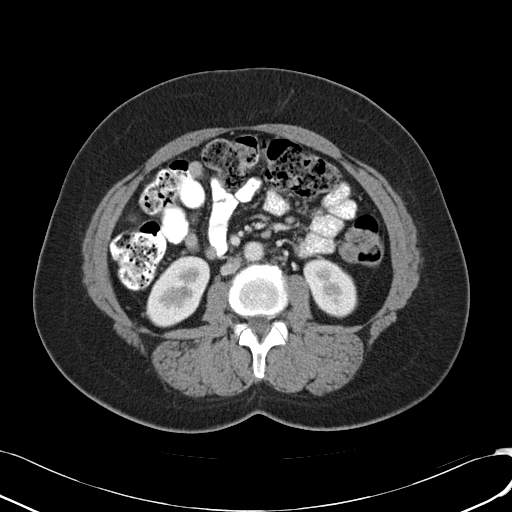
[im 56/90  soft-tissue]
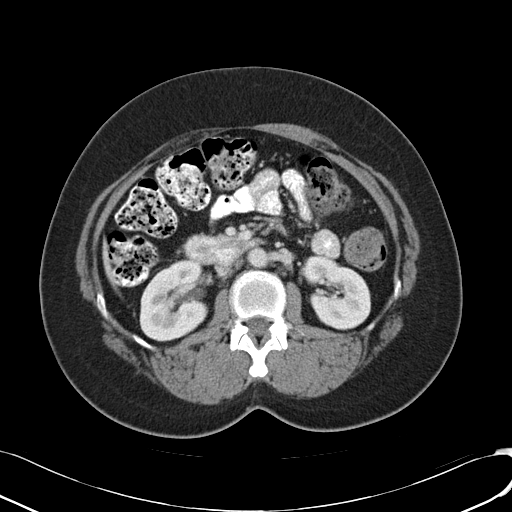
[im 56/90  bone]
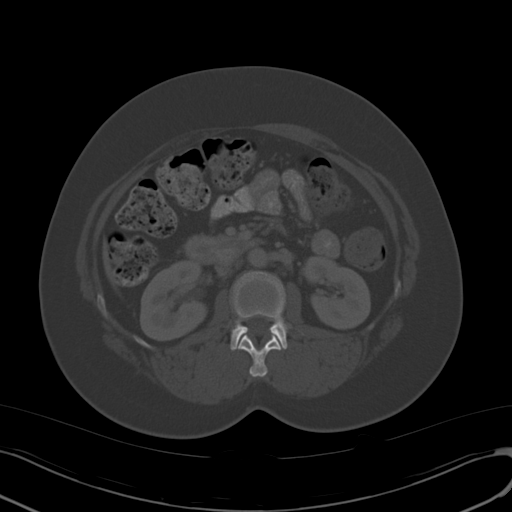
[im 62/90  soft-tissue]
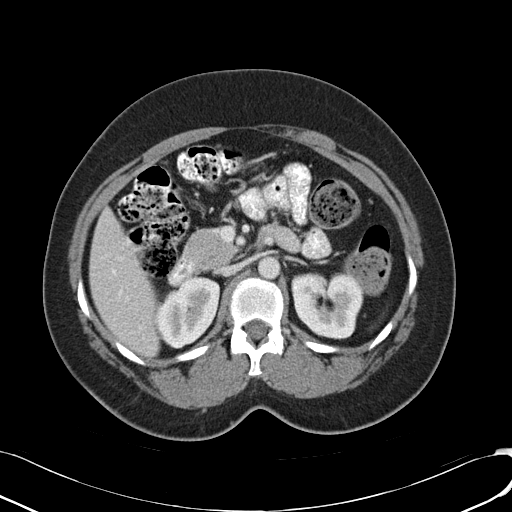
[im 67/90  soft-tissue]
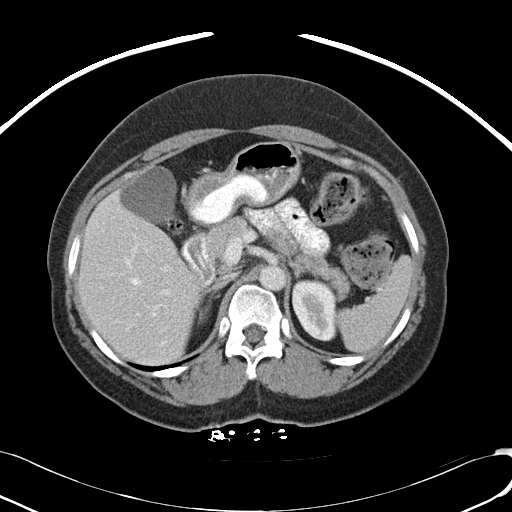
[im 73/90  soft-tissue]
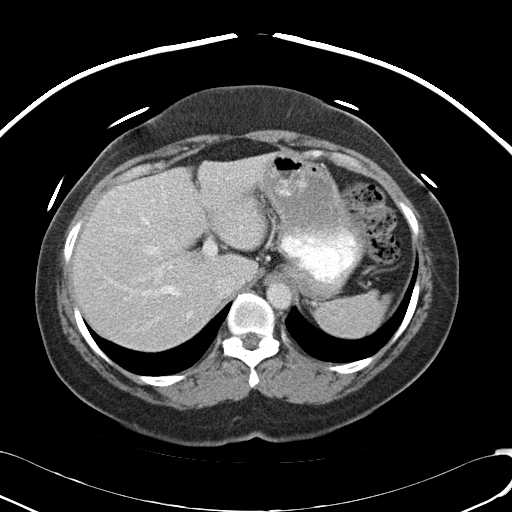
[im 78/90  soft-tissue]
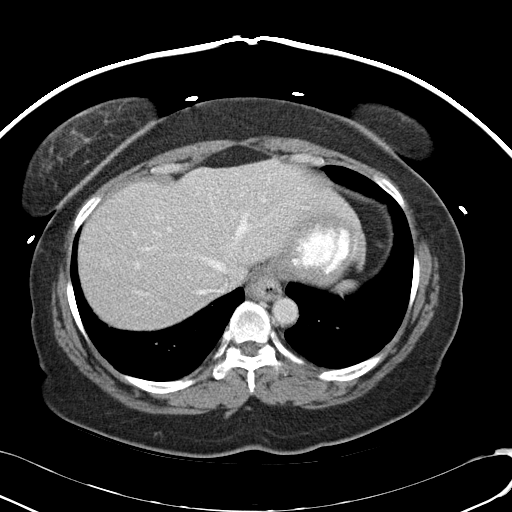
[im 84/90  soft-tissue]
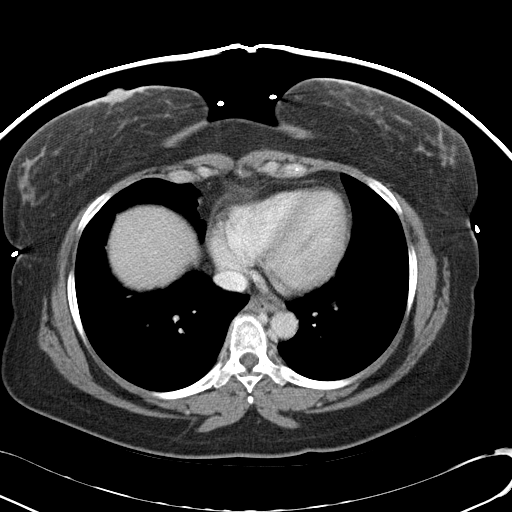

[Series 4: mpr cor post contrast (id) · coronal · 0.74mm/px · 3 of 71 slices shown]
[im 24/71  soft-tissue]
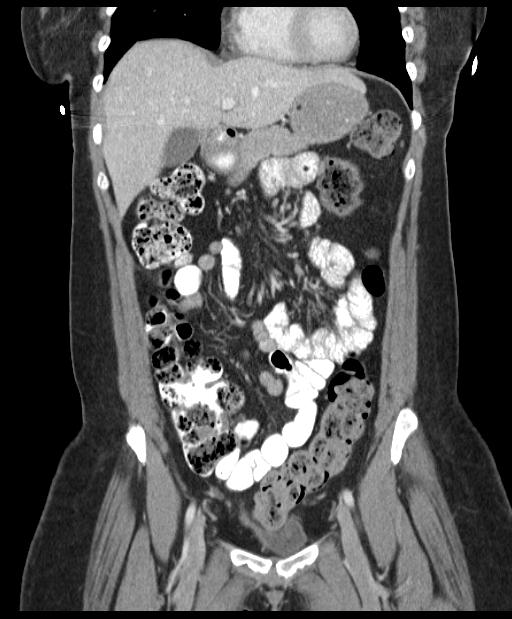
[im 32/71  soft-tissue]
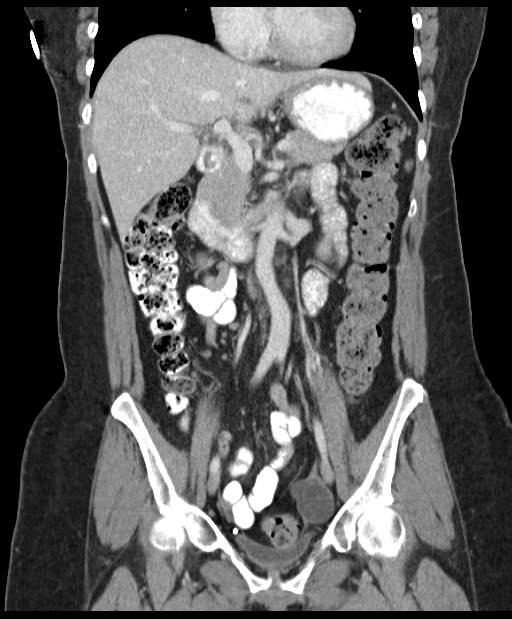
[im 39/71  soft-tissue]
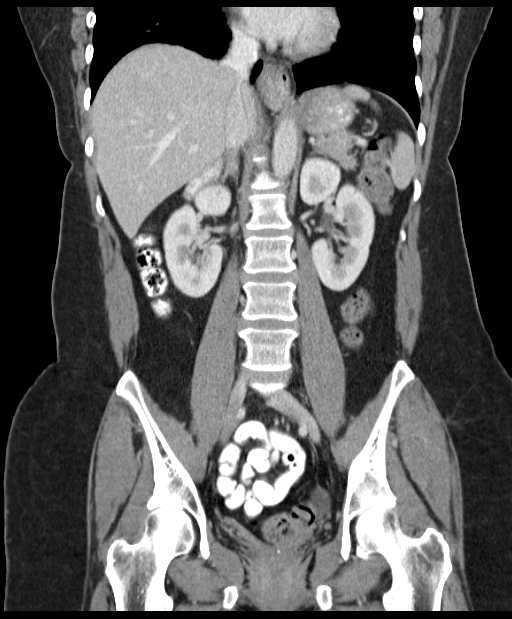

[17 of 46 positions shown; findings below may reference images not displayed]

FINDINGS: Visualized lung bases clear.

Unremarkable liver, gallbladder, spleen, hands, kidneys, abdominal
aorta, small bowel, appendix.  Moderate fecal material throughout
the colon which is nondilated.  Urinary bladder is nondistended.
Bilateral pelvic phleboliths.  No free fluid.  No free air.

4.3 cm septated left ovarian cyst.  No pelvic, retroperitoneal, or
mesenteric adenopathy.  Portal vein patent.  Lumbar spine
unremarkable.  No nephrolithiasis.  No hydronephrosis.
IMPRESSION: 1.  No acute abdominal process.
2.  4.3 cm left ovarian cyst.  Similar findings were described on
the previous study and assessed with ultrasound.

## 2012-05-29 ENCOUNTER — Emergency Department (HOSPITAL_COMMUNITY)
Admission: EM | Admit: 2012-05-29 | Discharge: 2012-05-29 | Disposition: A | Discharge status: Home / Self Care | Payer: BC Managed Care – PPO | Attending: Emergency Medicine | Admitting: Emergency Medicine

## 2012-05-29 ENCOUNTER — Encounter (HOSPITAL_COMMUNITY): Payer: Self-pay | Admitting: *Deleted

## 2012-05-29 DIAGNOSIS — I10 Essential (primary) hypertension: Secondary | ICD-10-CM | POA: Insufficient documentation

## 2012-05-29 DIAGNOSIS — J449 Chronic obstructive pulmonary disease, unspecified: Secondary | ICD-10-CM | POA: Insufficient documentation

## 2012-05-29 DIAGNOSIS — H6990 Unspecified Eustachian tube disorder, unspecified ear: Secondary | ICD-10-CM

## 2012-05-29 DIAGNOSIS — J029 Acute pharyngitis, unspecified: Secondary | ICD-10-CM

## 2012-05-29 DIAGNOSIS — J4489 Other specified chronic obstructive pulmonary disease: Secondary | ICD-10-CM | POA: Insufficient documentation

## 2012-05-29 DIAGNOSIS — Z7982 Long term (current) use of aspirin: Secondary | ICD-10-CM | POA: Insufficient documentation

## 2012-05-29 DIAGNOSIS — B009 Herpesviral infection, unspecified: Secondary | ICD-10-CM | POA: Insufficient documentation

## 2012-05-29 DIAGNOSIS — Z79899 Other long term (current) drug therapy: Secondary | ICD-10-CM | POA: Insufficient documentation

## 2012-05-29 DIAGNOSIS — H6980 Other specified disorders of Eustachian tube, unspecified ear: Secondary | ICD-10-CM

## 2012-05-29 MED ORDER — IBUPROFEN 800 MG PO TABS
ORAL_TABLET | ORAL | Status: AC
  Administered 2012-05-29: 800 mg via ORAL
  Filled 2012-05-29: qty 1

## 2012-05-29 MED ORDER — IBUPROFEN 600 MG PO TABS: 600.0000 mg | ORAL_TABLET | Freq: Four times a day (QID) | ORAL | Status: DC | PRN

## 2012-05-29 MED ORDER — LIDOCAINE VISCOUS 2 % MT SOLN
20.0000 mL | Freq: Once | OROMUCOSAL | Status: AC
  Administered 2012-05-29: 20 mL via OROMUCOSAL
  Filled 2012-05-29 (×2): qty 15

## 2012-05-29 MED ORDER — IBUPROFEN 800 MG PO TABS
800.0000 mg | ORAL_TABLET | Freq: Once | ORAL | Status: AC
  Administered 2012-05-29: 800 mg via ORAL

## 2012-05-29 NOTE — ED Notes (Signed)
Throat pain began 2d ago.  Also has L ear pain.  TM appears bulging w/out erythema or exudate.  She has also been lightheaded x 2d. No vertigo.

## 2012-05-29 NOTE — ED Notes (Signed)
Sore throat.

## 2012-06-01 NOTE — ED Provider Notes (Signed)
History     CSN: [id number]  Arrival date & time 05/29/12  2027   First MD Initiated Contact with Patient 05/29/12 2057      Chief Complaint  Patient presents with  . Sore Throat    (Consider location/radiation/quality/duration/timing/severity/associated sxs/prior treatment) HPI Comments: Kimberly [name] presents with a 2 day history of sore throat,  Left ear ache. Low grade fever and vague intermittent lightheadedness.  She denies tinnitus, headache, stiff neck, swelling,  ear drainage,  cough and shortness of breath.  She had nasal congestion which is improved today.  Throat pain is constant,  But worse with swallowing.  She has tolerated fluids and soft foods with no problem.  She has taken tylenol without relief of her pain.  Patient is a 51 y.o. female presenting with pharyngitis. The history is provided by the patient.  Sore Throat Associated symptoms include congestion and a sore throat. Pertinent negatives include no abdominal pain, arthralgias, chest pain, fever, headaches, joint swelling, nausea, neck pain, numbness, rash or weakness.    Past Medical History  Diagnosis Date  . Hypertension   . COPD (chronic obstructive pulmonary disease)   . Herpes     Past Surgical History  Procedure Date  . Abdominal hysterectomy   . Rotator cuff repair     No family history on file.  History  Substance Use Topics  . Smoking status: Never Smoker   . Smokeless tobacco: Not on file  . Alcohol Use: No    OB History    Grav Para Term Preterm Abortions TAB SAB Ect Mult Living                  Review of Systems  Constitutional: Negative for fever.  HENT: Positive for ear pain, congestion and sore throat. Negative for hearing loss, rhinorrhea, trouble swallowing, neck pain, neck stiffness and voice change.   Eyes: Negative.   Respiratory: Negative for chest tightness and shortness of breath.   Cardiovascular: Negative for chest pain.  Gastrointestinal: Negative for  nausea and abdominal pain.  Genitourinary: Negative.   Musculoskeletal: Negative for joint swelling and arthralgias.  Skin: Negative.  Negative for rash and wound.  Neurological: Negative for dizziness, weakness, light-headedness, numbness and headaches.  Hematological: Negative.   Psychiatric/Behavioral: Negative.     Allergies  Peanut-containing drug products; Shellfish allergy; and Codeine  Home Medications   Current Outpatient Rx  Name  Route  Sig  Dispense  Refill  . AMLODIPINE BESYLATE 5 MG PO TABS   Oral   Take 5 mg by mouth daily.         Marland Kitchen ESTRADIOL 0.5 MG PO TABS   Oral   Take 0.5 mg by mouth daily.           Marland Kitchen NAPROXEN SODIUM 220 MG PO TABS   Oral   Take 220 mg by mouth every 8 (eight) hours as needed. For pain          . VALACYCLOVIR HCL 500 MG PO TABS   Oral   Take 500 mg by mouth 2 (two) times daily as needed. For outbreaks          . BUDESONIDE-FORMOTEROL FUMARATE 160-4.5 MCG/ACT IN AERO   Inhalation   Inhale 2 puffs into the lungs 2 (two) times daily.         . IBUPROFEN 600 MG PO TABS   Oral   Take 1 tablet (600 mg total) by mouth every 6 (six) hours as needed for  pain.   21 tablet   0     BP 135/93  Pulse 106  Temp 100.6 F (38.1 C) (Oral)  Resp 18  Ht 5\' 1"  (1.549 m)  Wt 149 lb (67.586 kg)  BMI 28.15 kg/m2  SpO2 96%  Physical Exam  Constitutional: She is oriented to person, place, and time. She appears well-developed and well-nourished.  HENT:  Head: Normocephalic and atraumatic. No trismus in the jaw.  Right Ear: Tympanic membrane and ear canal normal.  Left Ear: Ear canal normal. No mastoid tenderness. Tympanic membrane is bulging. Tympanic membrane is not erythematous. A middle ear effusion is present.  Nose: Mucosal edema and rhinorrhea present.  Mouth/Throat: Uvula is midline, oropharynx is clear and moist and mucous membranes are normal. No oropharyngeal exudate, posterior oropharyngeal edema, posterior oropharyngeal  erythema or tonsillar abscesses.  Eyes: Conjunctivae normal are normal.  Cardiovascular: Normal rate and normal heart sounds.   Pulmonary/Chest: Effort normal. No respiratory distress. She has no wheezes. She has no rales.  Abdominal: Soft. There is no tenderness.  Musculoskeletal: Normal range of motion.  Neurological: She is alert and oriented to person, place, and time.  Skin: Skin is warm and dry. No rash noted.  Psychiatric: She has a normal mood and affect.    ED Course  Procedures (including critical care time)   Labs Reviewed  RAPID STREP SCREEN  LAB REPORT - SCANNED   No results found.   1. Viral pharyngitis   2. Eustachian tube dysfunction       MDM  Labs reviewed.  Viral phayrngitis,  Left eustachian dysfunction.  Pt encouraged use of ibuprofen,  Salt gargles, menthol drops.  May also benefit from coricidin decongestant.  Increased fluid intake.  F/u  With pcp if sx are not improved or if sx worsen.        Burgess Amor, PA 06/01/12 2014

## 2012-06-02 NOTE — ED Provider Notes (Signed)
Medical screening examination/treatment/procedure(s) were performed by non-physician practitioner and as supervising physician I was immediately available for consultation/collaboration.   Kimberly Pell L Achillies Buehl, MD 06/02/12 0712 

## 2012-07-04 IMAGING — CT CT CHEST W/ CM
2 of 3 series · 15 of 36 positions shown, 18 images · IV contrast (Omnipaque 300)
Comparison: Chest radiograph 10/19/2009, chest CT 04/02/2006

CLINICAL DATA: Cough, chest pain

CT CHEST WITH CONTRAST
TECHNIQUE: Multidetector CT imaging of the chest was performed
following the standard protocol during bolus administration of
intravenous contrast.
Contrast: 80 ml Omniscan 300 IV contrast

[Series 2: chestroutine 5.0 b40f · axial · 0.61mm/px · z∈[-260,-20]mm · 12 of 58 slices shown, 15 images]
[im 5/58  mediastinal]
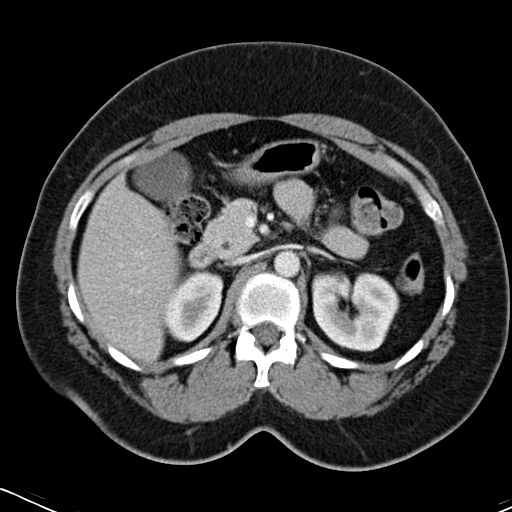
[im 5/58  lung]
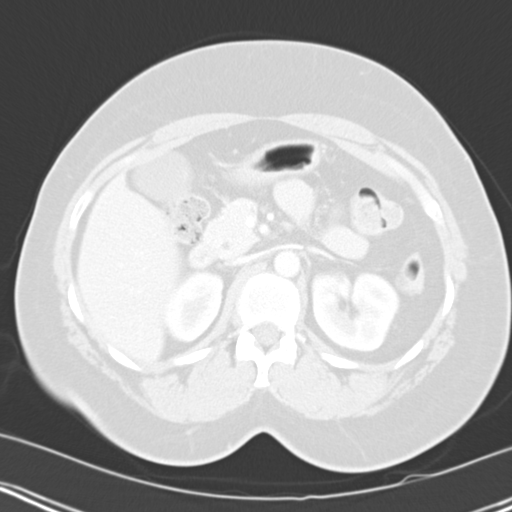
[im 9/58  lung]
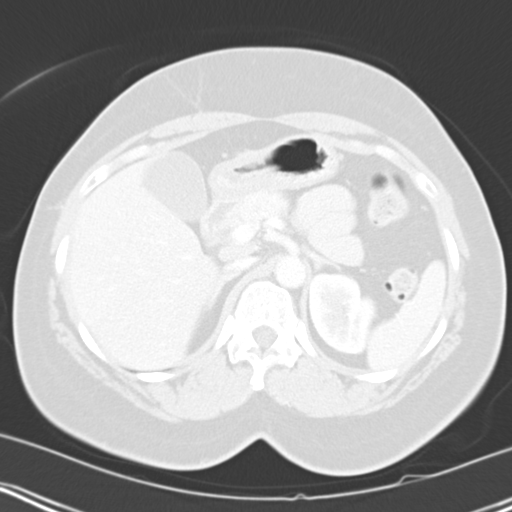
[im 13/58  lung]
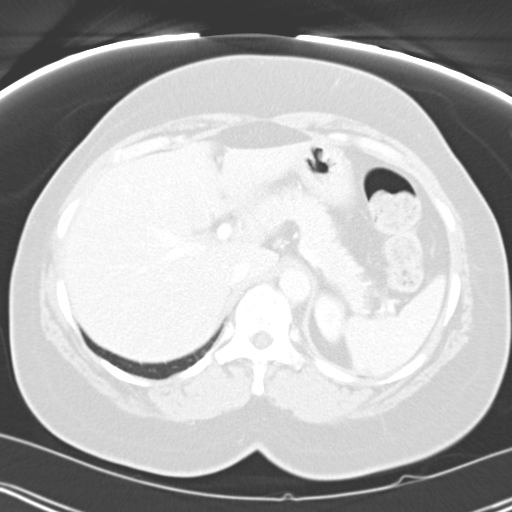
[im 17/58  lung]
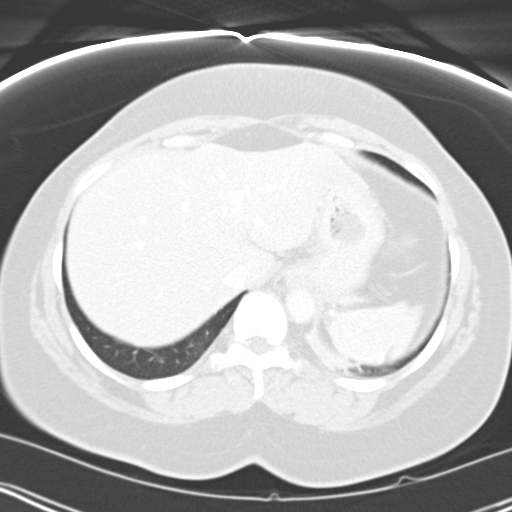
[im 22/58  mediastinal]
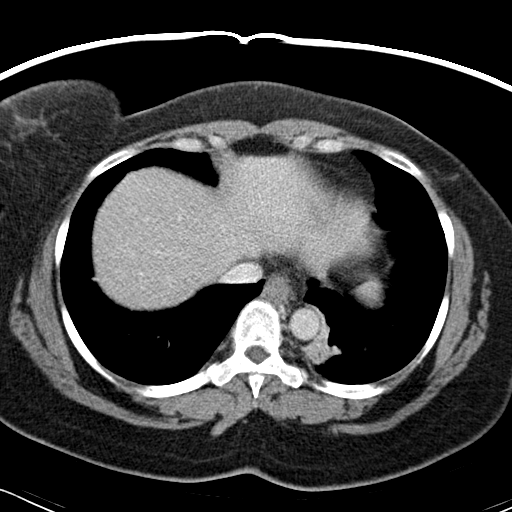
[im 22/58  lung]
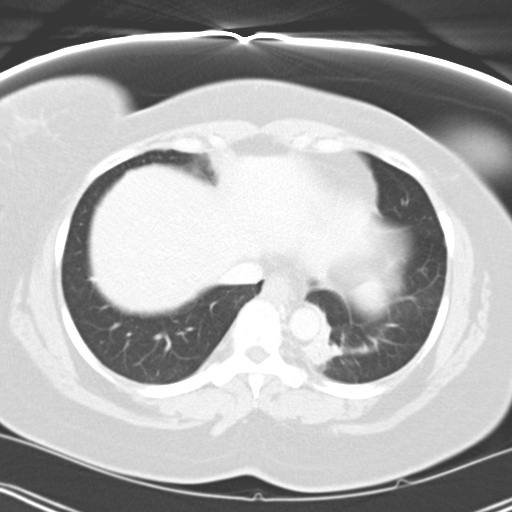
[im 26/58  lung]
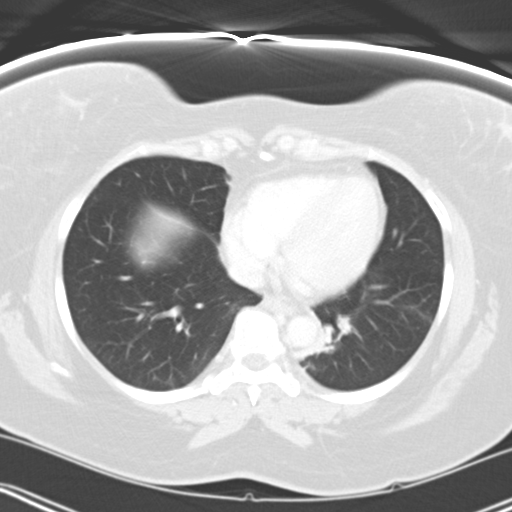
[im 32/58  lung]
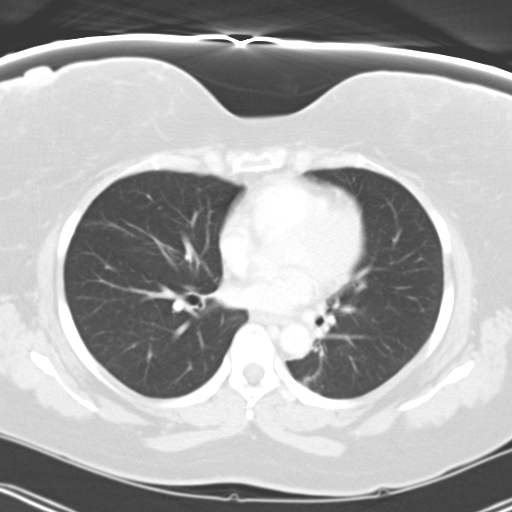
[im 36/58  lung]
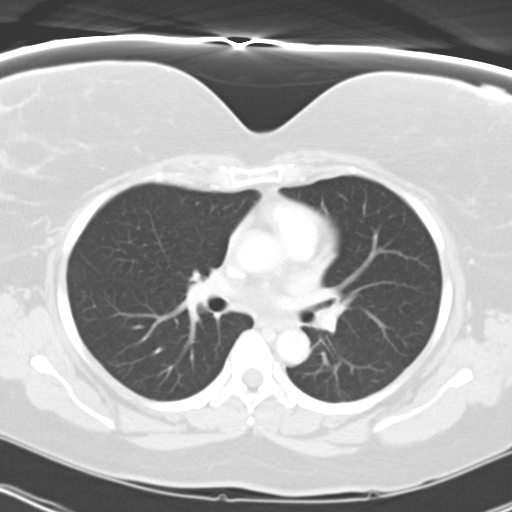
[im 41/58  mediastinal]
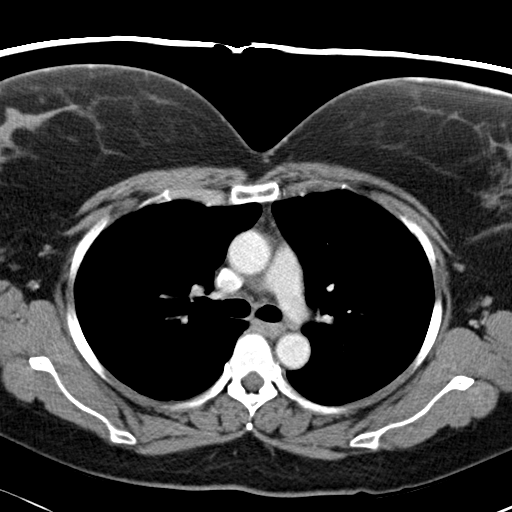
[im 41/58  lung]
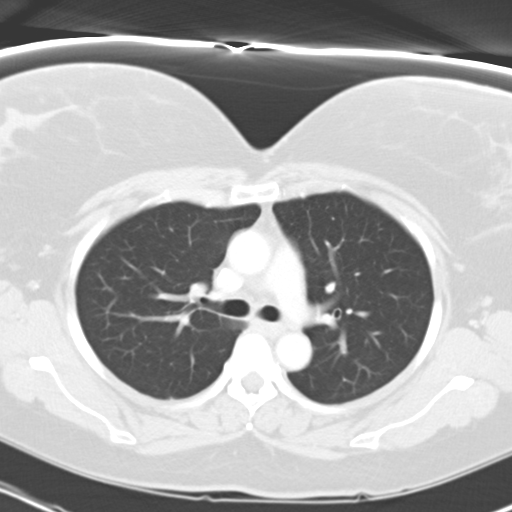
[im 45/58  lung]
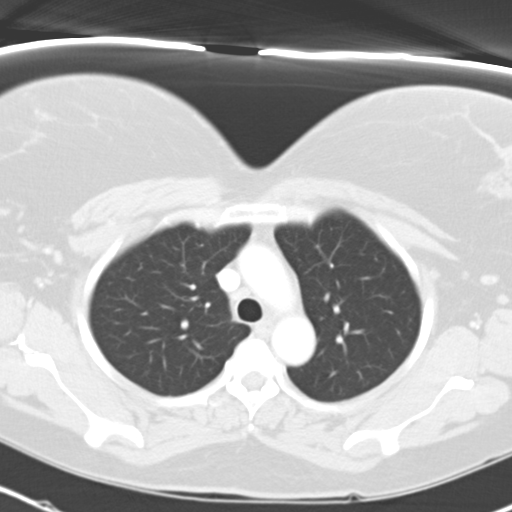
[im 49/58  lung]
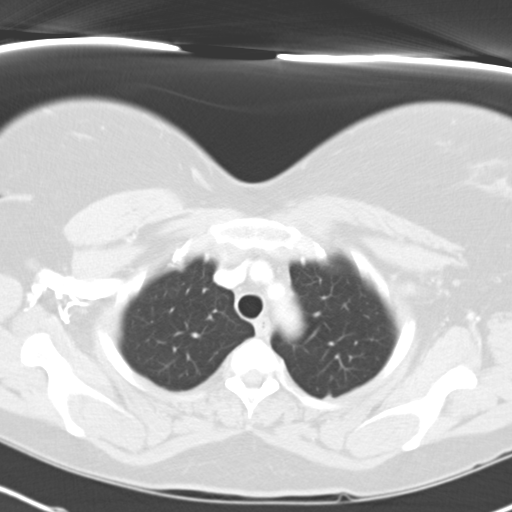
[im 53/58  lung]
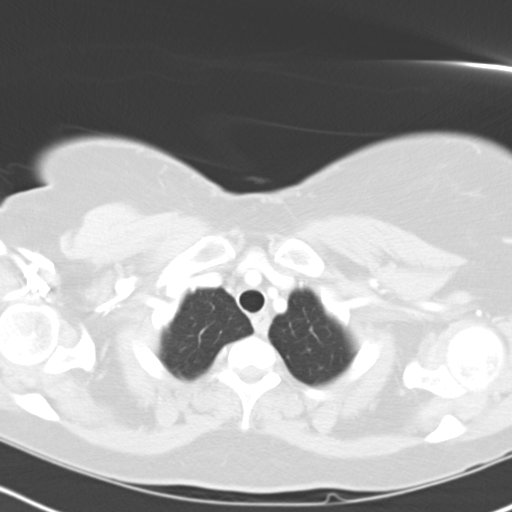

[Series 4: mpr coronal chest 3mm · coronal · 0.57mm/px · 3 of 82 slices shown]
[im 17/82  lung]
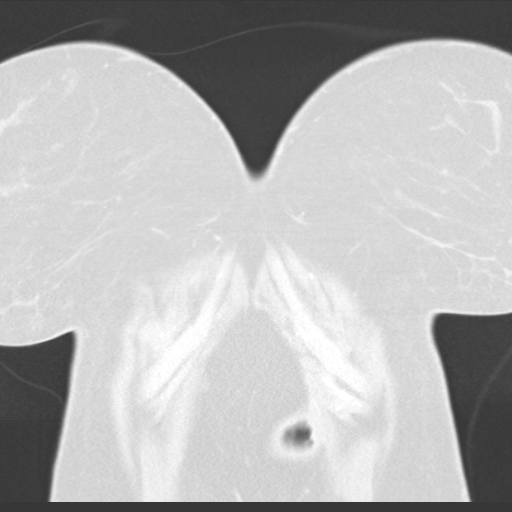
[im 33/82  lung]
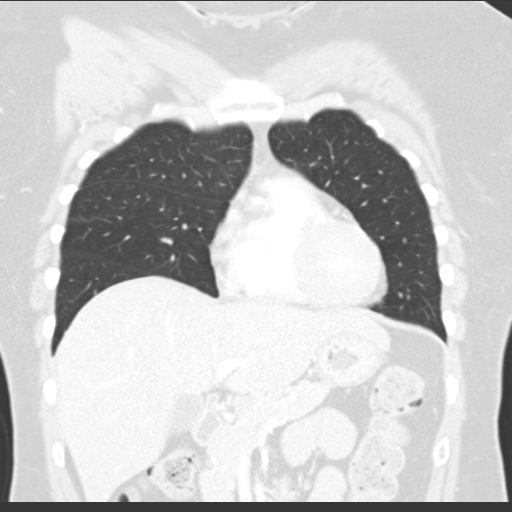
[im 49/82  lung]
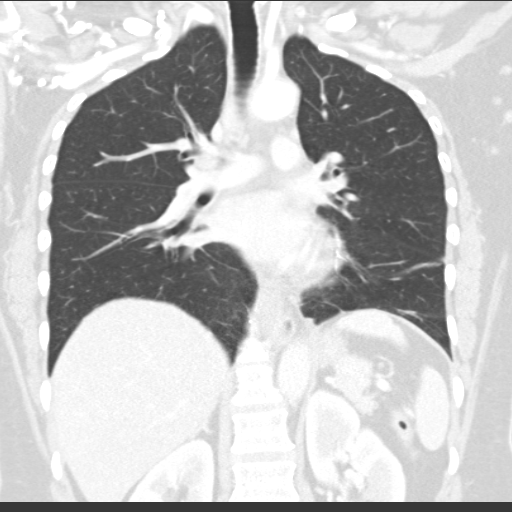

[15 of 36 positions shown; findings below may reference images not displayed]

FINDINGS: Heart size normal.  No pericardial or pleural effusion.
Small hiatal hernia incidentally identified.  No lymphadenopathy.
Small representative pretracheal 0.5 cm node noted on image 15.

A few areas of nodular pleural thickening are again noted.  Left
lower lobe medial aspect plate-like probable atelectasis without
definite mass like component is new since the prior study but
otherwise age indeterminate.  No pulmonary nodule.  Central airways
are patent.

No new bony abnormality.
IMPRESSION: Linear probable left lower lobe scarring or atelectasis.  No other
new abnormality.

## 2012-07-30 HISTORY — PX: ROTATOR CUFF REPAIR: SHX139

## 2012-09-16 IMAGING — MG MM DIGITAL SCREENING
4 series · 4 of 4 positions shown · non-contrast
Comparison: none

DG SCREEN MAMMOGRAM BILATERAL
Bilateral CC and MLO view(s) were taken.

DIGITAL SCREENING MAMMOGRAM WITH CAD:
The breast tissue is heterogeneously dense.  Microcalcifications are present in the left breast.  
Characterization with magnification views is recommended.  No mass or malignant type calcifications
are identified in the right breast.  Compared with prior studies.
Images were processed with CAD.

[L CC]
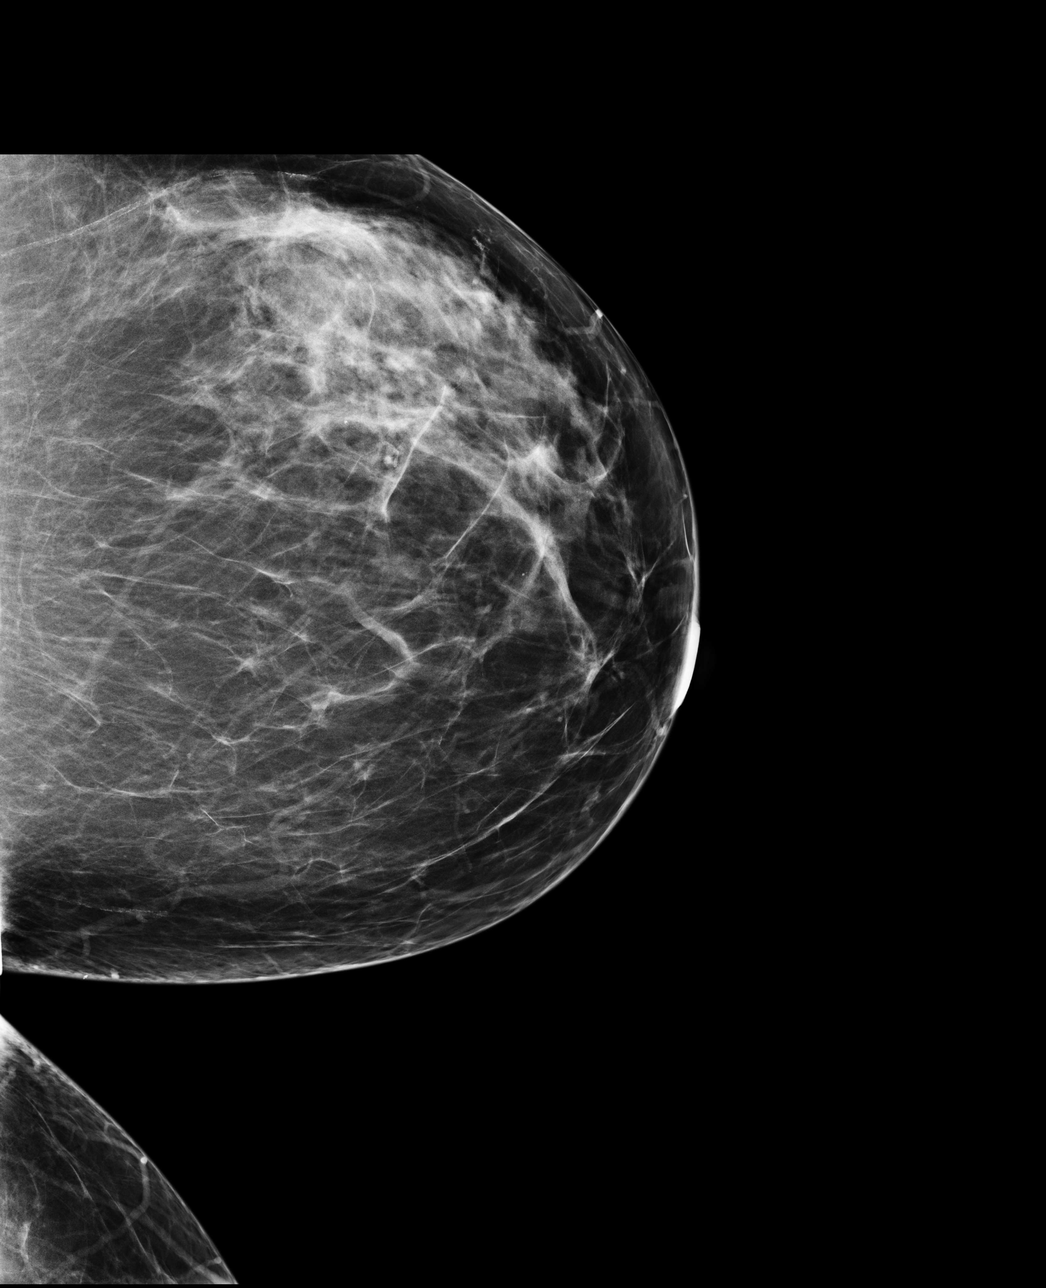

[L MLO]
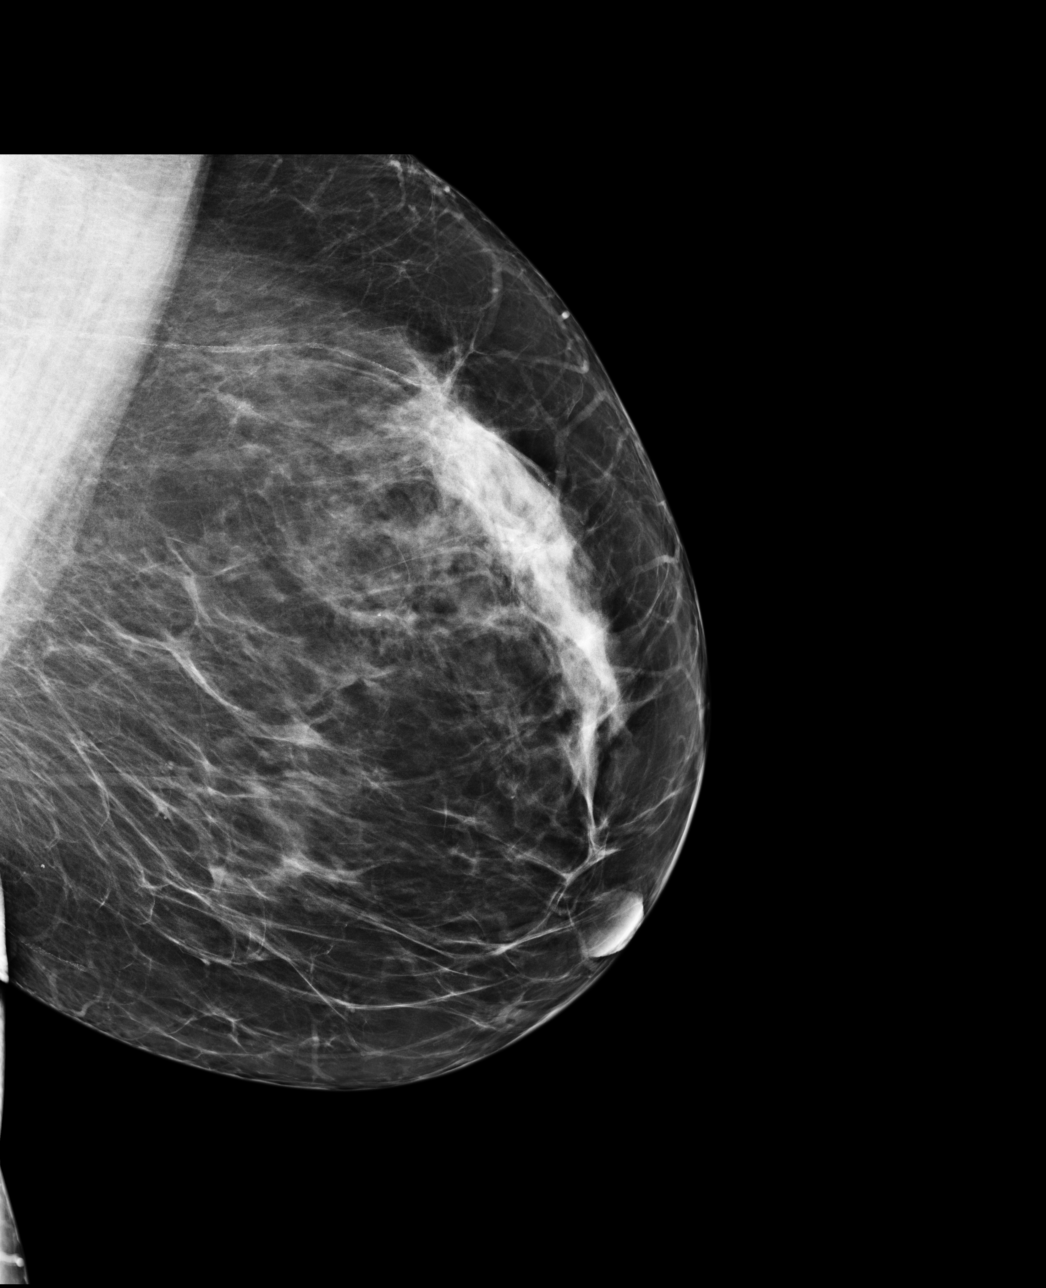

[R CC]
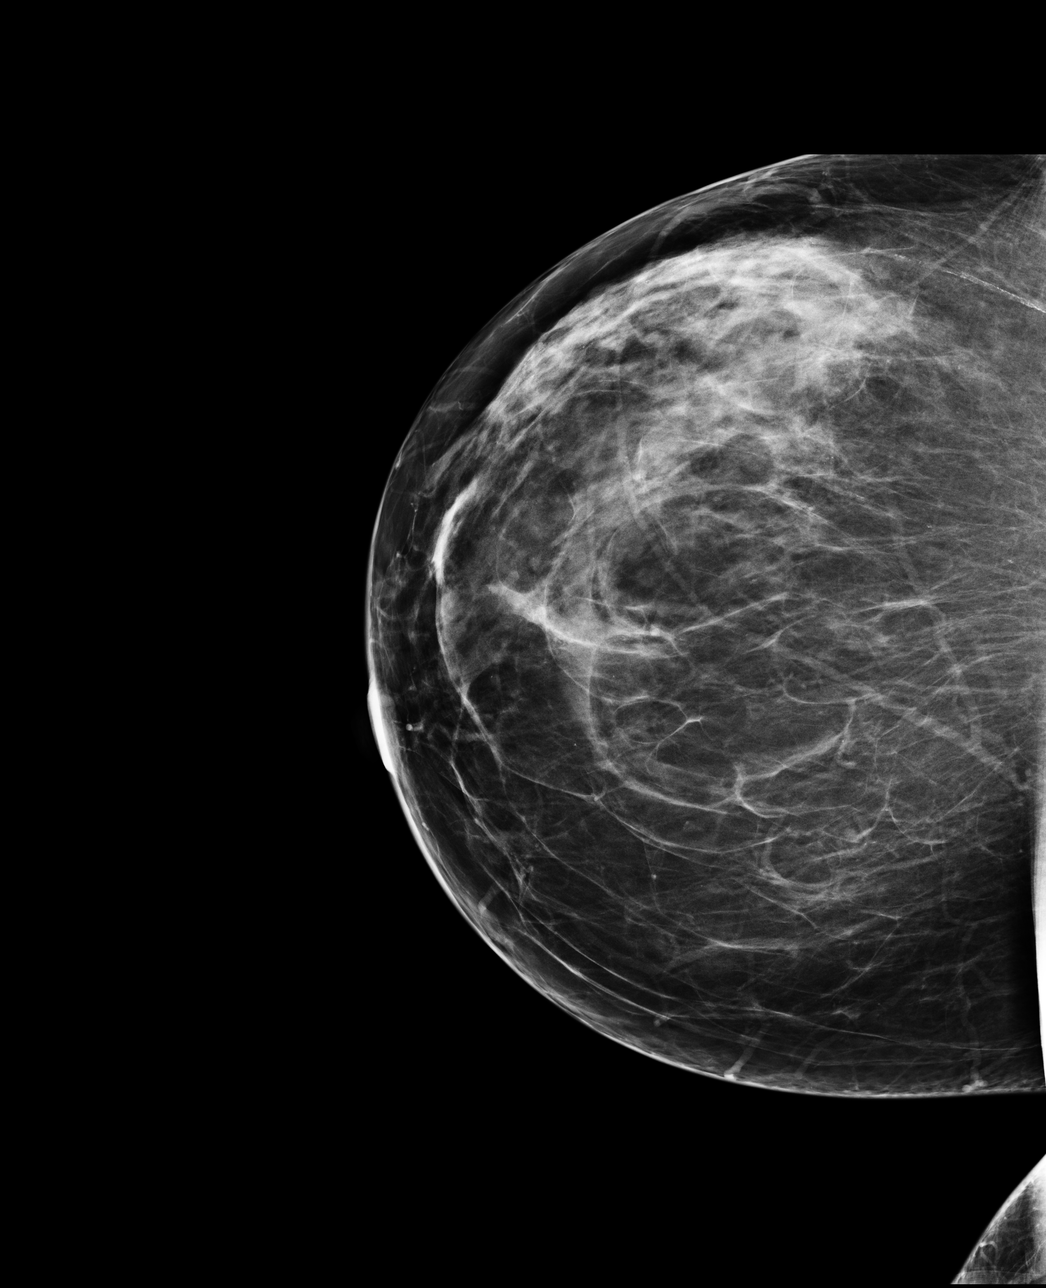

[R MLO]
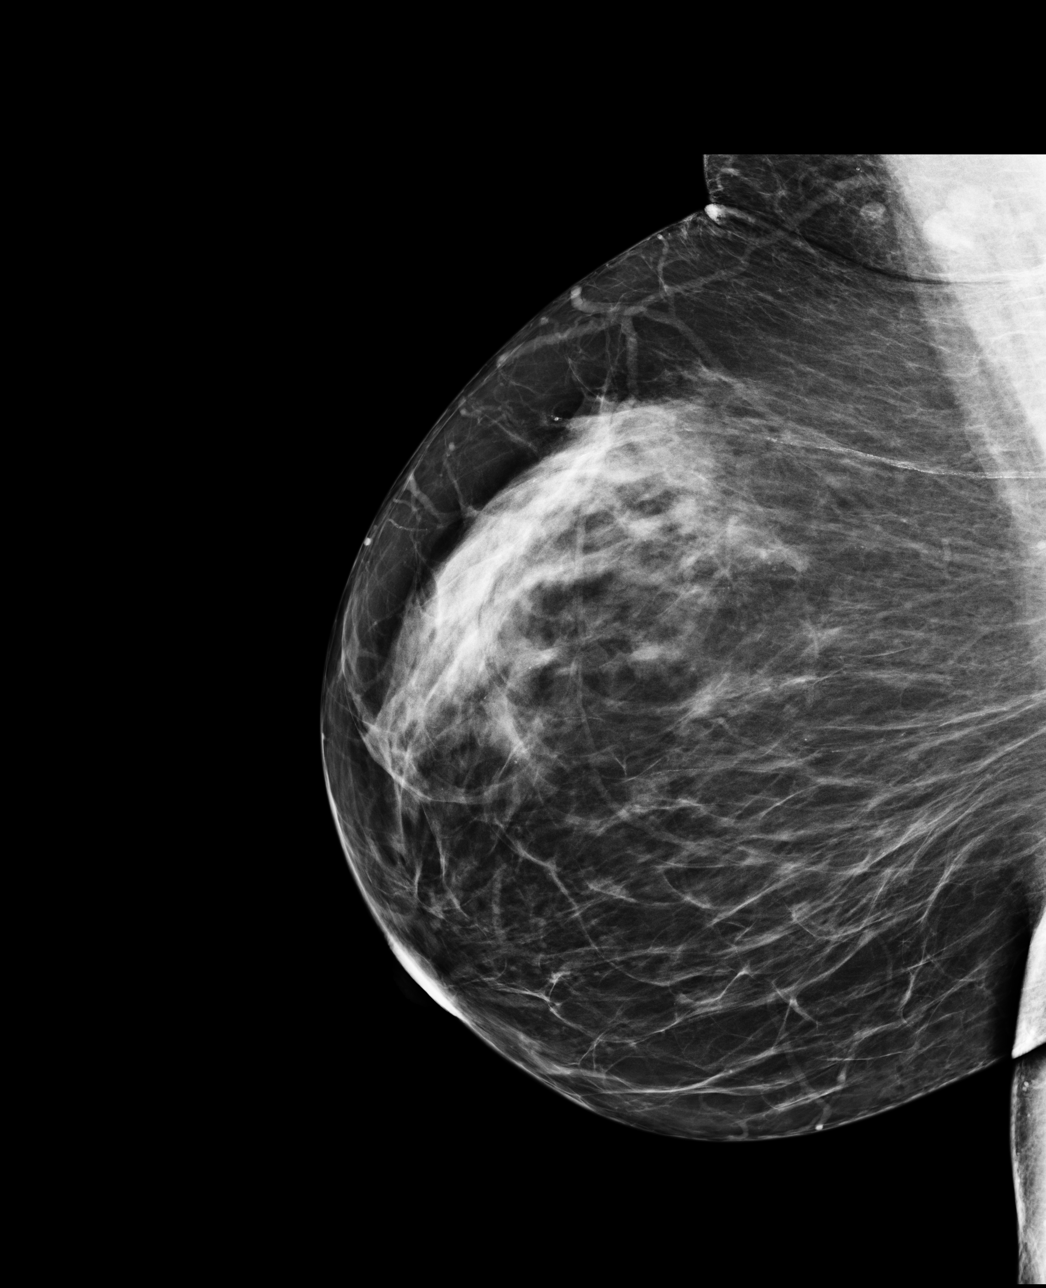

[4 of 4 positions shown; findings below may reference images not displayed]

IMPRESSION: Calcifications, left breast.  Additional evaluation is indicated. The patient will be contacted for
additional studies and a supplementary report will follow.  No specific mammographic evidence of 
malignancy, right breast.

ASSESSMENT: Need additional imaging evaluation and/or prior mammograms for comparison - BI-RADS 0

Further imaging of the left breast.
,

## 2012-10-03 IMAGING — MG MM DIGITAL DIAGNOSTIC UNILAT L
3 series · 3 of 3 positions shown · non-contrast
Comparison: April 25, 2009

Addendum Begins

The IMPRESSION should read:
There is no radiographic evidence of malignancy on the left.
Bilateral screening mammogram in April 2011 recommended.
BI-RADS CATEGORY 2:  Benign finding(s).
Addendum Ends
CLINICAL DATA: Abnormal screening mammogram
DIGITAL DIAGNOSTIC LEFT MAMMOGRAM

[L CC]
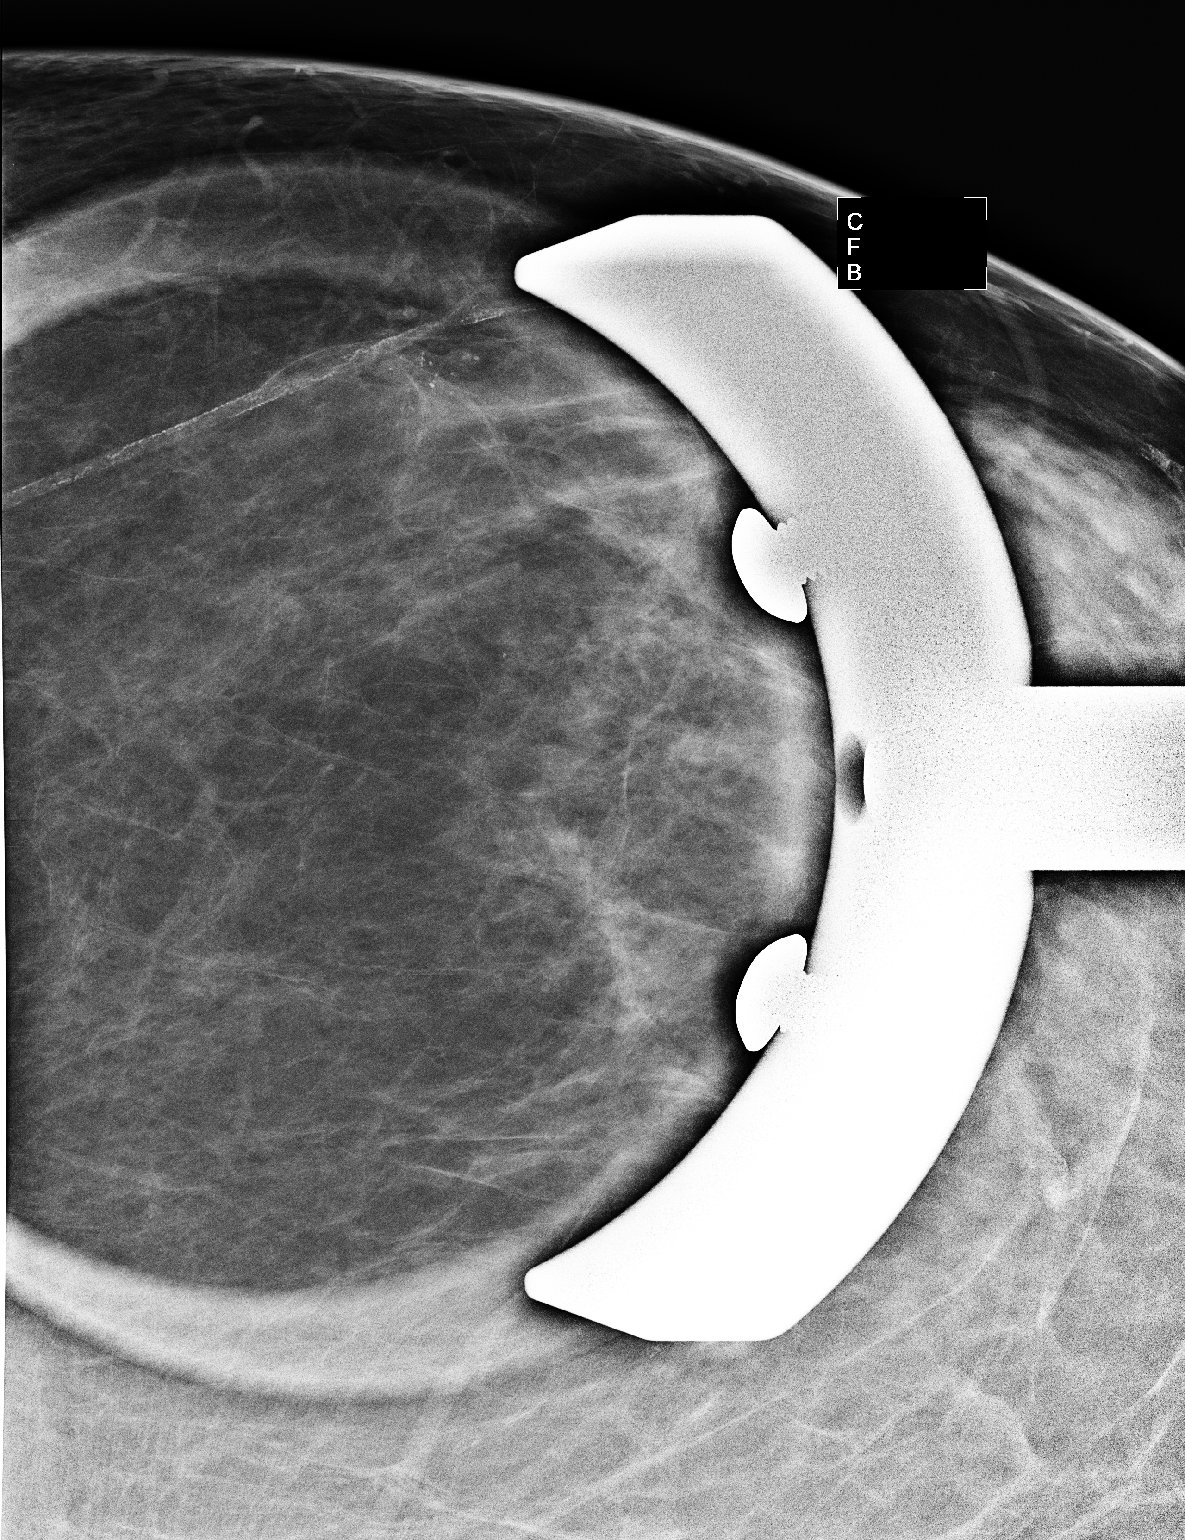

[L ML (1 of 2)]
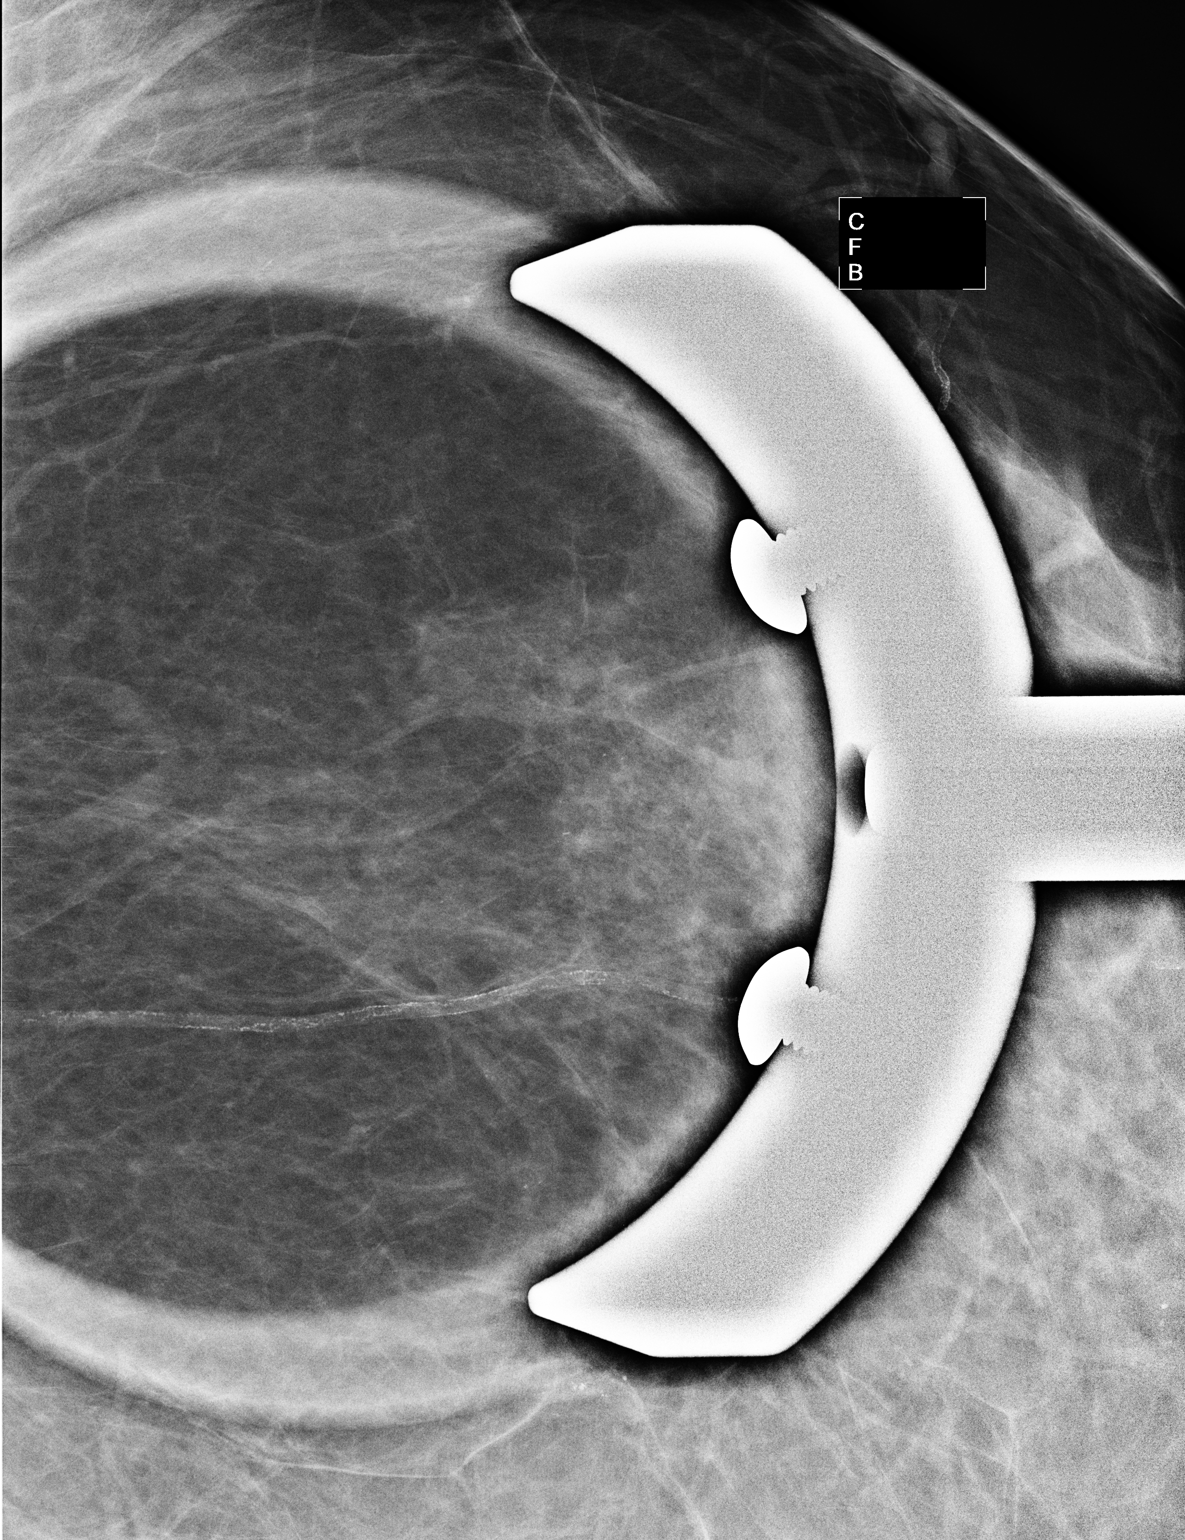

[L ML (2 of 2)]
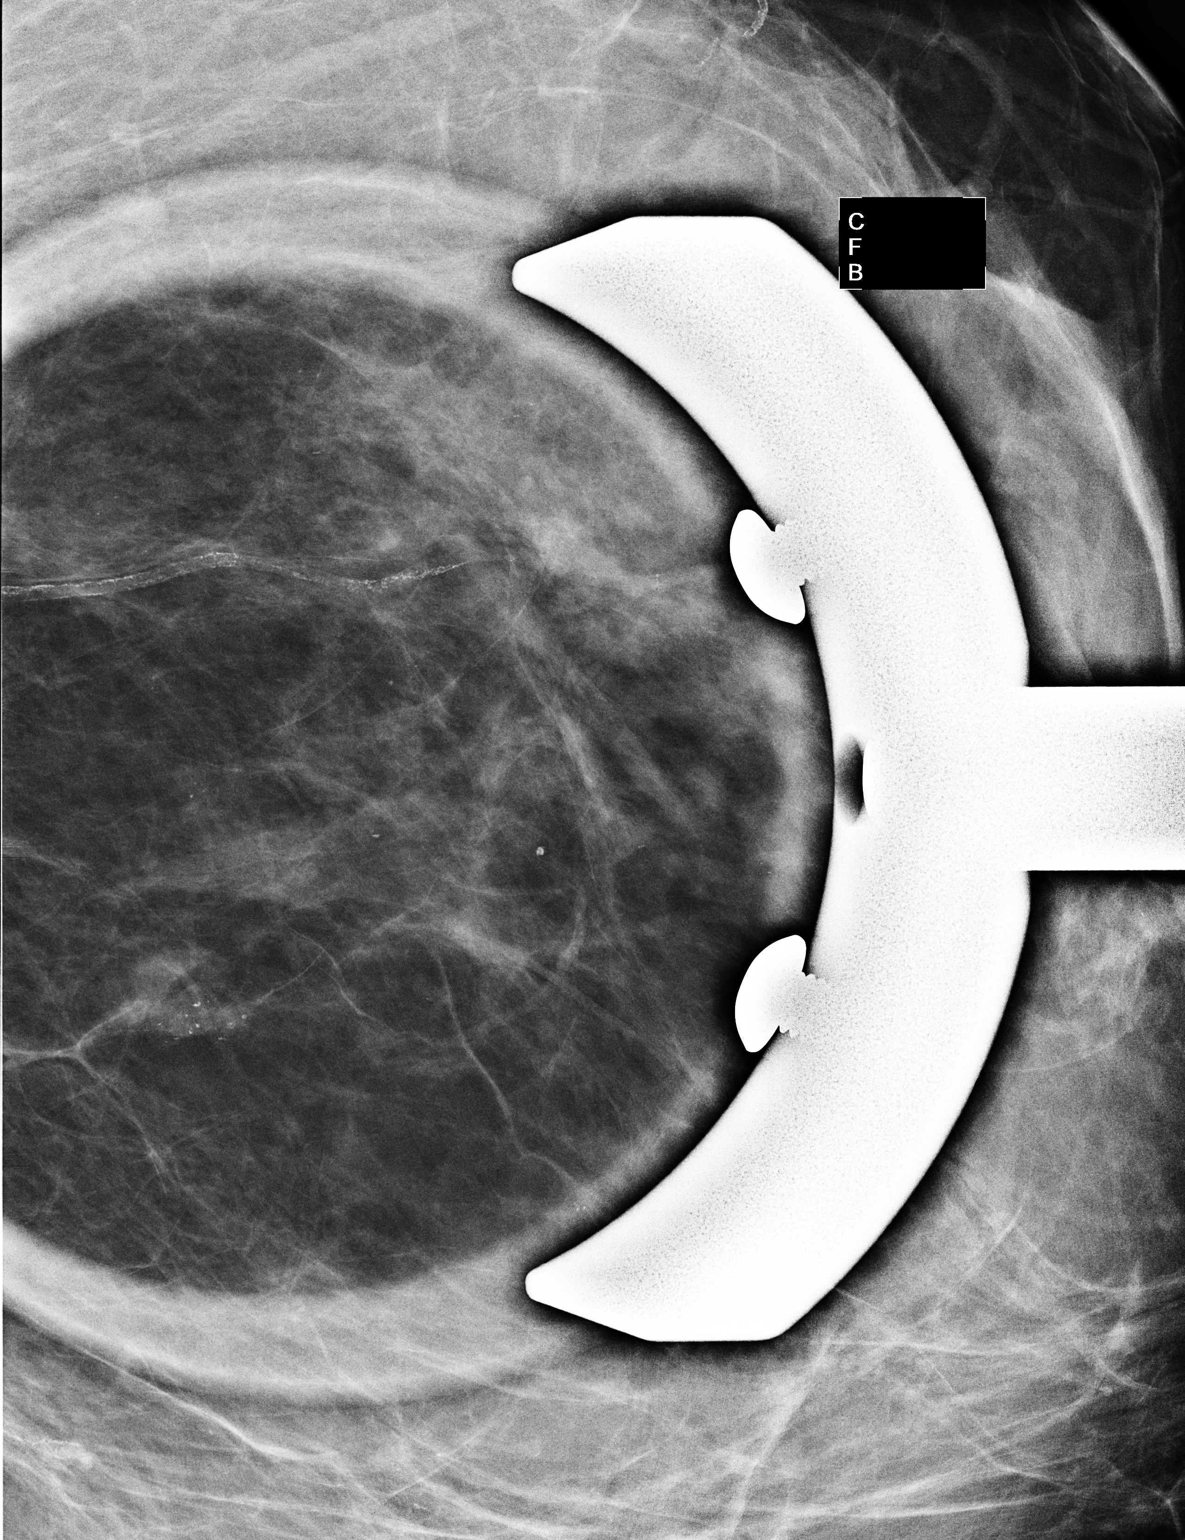

[3 of 3 positions shown; findings below may reference images not displayed]

FINDINGS: Magnification views reveal stable, benign milk of
calcium in the left upper outer quadrant.
IMPRESSION: There is no radiographic evidence of malignancy on the left.
Bilateral screening mammogram in March 2010 recommended.

BI-RADS CATEGORY 3:  Probably benign finding(s) - short interval
follow-up suggested.

## 2012-10-31 ENCOUNTER — Other Ambulatory Visit (HOSPITAL_COMMUNITY): Payer: Self-pay | Admitting: Internal Medicine

## 2012-10-31 DIAGNOSIS — Z139 Encounter for screening, unspecified: Secondary | ICD-10-CM

## 2012-11-03 ENCOUNTER — Ambulatory Visit (HOSPITAL_COMMUNITY)
Admission: RE | Admit: 2012-11-03 | Discharge: 2012-11-03 | Disposition: A | Discharge status: Home / Self Care | Payer: BC Managed Care – PPO | Source: Ambulatory Visit | Attending: Internal Medicine | Admitting: Internal Medicine

## 2012-11-03 ENCOUNTER — Ambulatory Visit (HOSPITAL_COMMUNITY): Payer: BC Managed Care – PPO

## 2012-11-03 DIAGNOSIS — Z139 Encounter for screening, unspecified: Secondary | ICD-10-CM

## 2012-11-03 DIAGNOSIS — Z1231 Encounter for screening mammogram for malignant neoplasm of breast: Secondary | ICD-10-CM | POA: Insufficient documentation

## 2013-02-04 DIAGNOSIS — Z8639 Personal history of other endocrine, nutritional and metabolic disease: Secondary | ICD-10-CM

## 2013-02-04 DIAGNOSIS — N179 Acute kidney failure, unspecified: Secondary | ICD-10-CM

## 2013-02-04 DIAGNOSIS — N186 End stage renal disease: Secondary | ICD-10-CM

## 2013-02-04 DIAGNOSIS — G9340 Encephalopathy, unspecified: Secondary | ICD-10-CM

## 2013-02-04 DIAGNOSIS — Z862 Personal history of diseases of the blood and blood-forming organs and certain disorders involving the immune mechanism: Secondary | ICD-10-CM

## 2013-02-04 DIAGNOSIS — I10 Essential (primary) hypertension: Secondary | ICD-10-CM

## 2013-02-04 DIAGNOSIS — D649 Anemia, unspecified: Secondary | ICD-10-CM

## 2013-02-05 DIAGNOSIS — R7881 Bacteremia: Secondary | ICD-10-CM

## 2013-02-05 DIAGNOSIS — A419 Sepsis, unspecified organism: Secondary | ICD-10-CM

## 2013-02-05 DIAGNOSIS — A4901 Methicillin susceptible Staphylococcus aureus infection, unspecified site: Secondary | ICD-10-CM

## 2013-02-11 ENCOUNTER — Ambulatory Visit (INDEPENDENT_AMBULATORY_CARE_PROVIDER_SITE_OTHER): Payer: BC Managed Care – PPO | Admitting: Orthopedic Surgery

## 2013-02-11 ENCOUNTER — Encounter (HOSPITAL_COMMUNITY): Payer: Self-pay | Admitting: Pharmacy Technician

## 2013-02-11 ENCOUNTER — Encounter: Payer: Self-pay | Admitting: Orthopedic Surgery

## 2013-02-11 ENCOUNTER — Telehealth: Payer: Self-pay | Admitting: Orthopedic Surgery

## 2013-02-11 VITALS — BP 136/97 | Ht 61.0 in | Wt 151.0 lb

## 2013-02-11 DIAGNOSIS — G5600 Carpal tunnel syndrome, unspecified upper limb: Secondary | ICD-10-CM

## 2013-02-11 DIAGNOSIS — G5603 Carpal tunnel syndrome, bilateral upper limbs: Secondary | ICD-10-CM

## 2013-02-11 NOTE — Patient Instructions (Addendum)
Carpal Tunnel Surgery The carpal tunnel is a narrow hollow area in the wrist. It is formed by the wrist bones and ligaments. Nerves, blood vessels, and tendons on the palm side of your hand pass through the carpal tunnel. (The palm side is the side of your hand in the direction your fingers bend.) Repeated wrist motion or certain diseases may cause swelling within the tunnel. That is why these are sometimes called repetitive trauma disorders. It is also a common problem in late pregnancy because of water retention. This swelling pinches the main nerve in the wrist (median nerve). It causes the painful condition called carpal tunnel syndrome. A feeling of "pins and needles" may be noticed in the fingers or hand. The entire arm may ache from this condition. Carpal tunnel syndrome may clear up by itself. Cortisone injections may help. An electromyogram may be needed to confirm this diagnosis. This is a test which measures nerve conduction. The nerve conduction is usually slowed in a carpal tunnel syndrome. Sometimes, an operation may be needed to free the pinched nerve.   Carpal Tunnel Syndrome The carpal tunnel is a narrow area located on the palm side of your wrist. The tunnel is formed by the wrist bones and ligaments. Nerves, blood vessels, and tendons pass through the carpal tunnel. Repeated wrist motion or certain diseases may cause swelling within the tunnel. This swelling pinches the main nerve in the wrist (median nerve) and causes the painful hand and arm condition called carpal tunnel syndrome. CAUSES   Repeated wrist motions.  Wrist injuries.  Certain diseases like arthritis, diabetes, alcoholism, hyperthyroidism, and kidney failure.  Obesity.  Pregnancy. SYMPTOMS   A "pins and needles" feeling in your fingers or hand.  Tingling or numbness in your fingers or hand.  An aching feeling in your entire arm.  Wrist pain that goes up your arm to your shoulder.  Pain that goes down into  your palm or fingers.  A weak feeling in your hands. DIAGNOSIS  Your caregiver will take your history and perform a physical exam. An electromyography test may be needed. This test measures electrical signals sent out by the muscles. The electrical signals are usually slowed by carpal tunnel syndrome. You may also need X-rays. TREATMENT  Carpal tunnel syndrome may clear up by itself. Your caregiver may recommend a wrist splint or medicine such as a nonsteroidal anti-inflammatory medicine. Cortisone injections may help. Sometimes, surgery may be needed to free the pinched nerve.  HOME CARE INSTRUCTIONS   Take all medicine as directed by your caregiver. Only take over-the-counter or prescription medicines for pain, discomfort, or fever as directed by your caregiver.  If you were given a splint to keep your wrist from bending, wear it as directed. It is important to wear the splint at night. Wear the splint for as long as you have pain or numbness in your hand, arm, or wrist. This may take 1 to 2 months.  Rest your wrist from any activity that may be causing your pain. If your symptoms are work-related, you may need to talk to your employer about changing to a job that does not require using your wrist.  Put ice on your wrist after long periods of wrist activity.  Put ice in a plastic bag.  Place a towel between your skin and the bag.  Leave the ice on for 15-20 minutes, 3-4 times a day.  Keep all follow-up visits as directed by your caregiver. This includes any orthopedic referrals, physical  therapy, and rehabilitation. Any delay in getting necessary care could result in a delay or failure of your condition to heal. SEEK IMMEDIATE MEDICAL CARE IF:   You have new, unexplained symptoms.  Your symptoms get worse and are not helped or controlled with medicines. MAKE SURE YOU:   Understand these instructions.  Will watch your condition.  Will get help right away if you are not doing well  or get worse. Document Released: 07/13/2000 Document Revised: 10/08/2011 Document Reviewed: 06/01/2011 [facility] Patient Information 2014 [place], [place]. Carpal Tunnel Surgery The carpal tunnel is a narrow hollow area in the wrist. It is formed by the wrist bones and ligaments. Nerves, blood vessels, and tendons on the palm side of your hand pass through the carpal tunnel. (The palm side is the side of your hand in the direction your fingers bend.) Repeated wrist motion or certain diseases may cause swelling within the tunnel. That is why these are sometimes called repetitive trauma disorders. It is also a common problem in late pregnancy because of water retention. This swelling pinches the main nerve in the wrist (median nerve). It causes the painful condition called carpal tunnel syndrome. A feeling of "pins and needles" may be noticed in the fingers or hand. The entire arm may ache from this condition. Carpal tunnel syndrome may clear up by itself. Cortisone injections may help. An electromyogram may be needed to confirm this diagnosis. This is a test which measures nerve conduction. The nerve conduction is usually slowed in a carpal tunnel syndrome. Sometimes, an operation may be needed to free the pinched nerve.  LET YOUR CAREGIVER KNOW ABOUT:  Allergies  Medications taken including herbs, eye drops, over the counter medications, and creams  Use of steroids (by mouth or creams)  Previous problems with anesthetics or novocaine  Possibility of pregnancy, if this applies  History of blood clots (thrombophlebitis)  History of bleeding or blood problems  Previous surgery  Other health problems RISKS AND COMPLICATIONS  Infection: A germ starts growing in the wound. This can usually be treated with antibiotics.  Damage to other organs may occur.  Bleeding following surgery can be a complication of almost all surgeries. Your surgeon takes every precaution to keep this from  happening.  Recurrence (return) of carpal tunnel syndrome following treatment is rare. BEFORE THE PROCEDURE  Stop smoking at least two weeks prior to surgery. This lowers risk during surgery.  Stop non steroidal medicine for ten days prior to surgery. Also, do not take aspirin unless OK'd by your surgeon.  Your caregiver may tell you to stop taking certain medicine that may affect the outcome of the surgery and your ability to heal. For example, you may need to stop taking anti-inflammatories, such as aspirin, because of possible bleeding problems. Other medicine may have interactions with anesthesia.  BE SURE TO LET YOUR CAREGIVER KNOW IF YOU HAVE BEEN ON STEROIDS (INCLUDING CREAMS) FOR LONG PERIODS OF TIME. THIS IS CRITICAL.  Your caregiver will discuss possible risks and complications with you before surgery. In addition to the usual risks of anesthesia, other common risks and complications include:  Temporary increase in pain due to surgery.  Uncorrected pain.  Infection. You should be present 60 minutes before your procedure or as directed.  PROCEDURE  Carpal tunnel release is generally recommended if symptoms last for 6 months. Surgery involves severing the band of tissue around the wrist to reduce pressure on the median nerve. Surgery is done under local anesthesia and does not  require an overnight hospital stay. Many patients require surgery on both hands. The following are types of carpal tunnel release surgery:  Open release surgery, the traditional procedure used to correct carpal tunnel syndrome, consists of making an incision up to 2 inches in the wrist and then cutting the carpal ligament to enlarge the carpal tunnel. The procedure is generally done under local anesthesia on an outpatient basis, unless there are unusual medical considerations.  Endoscopic surgery may allow faster functional recovery and less post-operative discomfort than traditional open release surgery. The  surgeon makes two incisions (cuts) (about 1/2 inch each) in the wrist and palm, inserts a camera attached to a tube, looks at the tissue on a screen, and cuts the carpal ligament (the tissue that holds joints together). This two-portal endoscopic surgery, generally performed under local anesthesia, is effective and minimizes scarring and scar tenderness, if any. One-portal endoscopic surgery for carpal tunnel syndrome is also available. Although symptoms may be better right after surgery, full recovery from carpal tunnel surgery can take months. Some patients may have infection, nerve damage, stiffness, and pain at the scar. Sometimes the wrist loses strength because the carpal ligament is cut. Patients should take part in physical therapy after surgery to restore wrist strength. Some patients may need to adjust job duties or even change jobs after recovery from surgery. The majority of patients recover completely without complications (additional problems). AFTER THE PROCEDURE After surgery, you will be taken to the recovery area where a nurse will watch and check your progress. Once you're awake, stable, and taking fluids well, without other problems you will be allowed to go home. HOME CARE INSTRUCTIONS   Once at home, an ice pack applied to your operative site may help with discomfort and keep the swelling down.  Follow your caregiver's instructions as to activities, exercises, physical therapy, and driving a car.  Maintain strength and range of motion as instructed.  If you were given a splint to keep your wrist from bending, use it as instructed. It is important to wear the splint at night. Use the splint for as long as you have pain or numbness in your hand, arm or wrist. This may take 1 to 2 months.  If you have pain at night, it may help to elevate your hand above the level of your heart (the center of your chest).  It is important to avoid activities which originally caused your carpal  tunnel syndrome for a couple weeks following surgery, or as directed by your surgeon. If your symptoms are work-related, you may need to talk to your employer about changing to a job that does not require using your wrist.  Only take over-the-counter or prescription medicines for pain, discomfort, or fever as directed by your caregiver.  Following periods of extended use, particularly hard (strenuous) use, apply an ice pack wrapped in a towel to the palm (anterior) side of the affected wrist for 20 to 30 minutes. Repeat as needed three to four times per day. This will help reduce swelling following surgery. SEEK MEDICAL CARE IF:   There is increased bleeding (more than a small spot) from the wound.  You notice redness, swelling, or increasing pain in the wound.  Pus is coming from wound.  An unexplained oral temperature above 102 F (38.9 C) develops.  You notice a foul smell coming from the wound or dressing. SEEK IMMEDIATE MEDICAL CARE IF:   You develop a rash.  You have difficulty breathing.  You  have any problems you think are related to allergies. Document Released: 02/28/2004 Document Revised: 10/08/2011 Document Reviewed: 05/22/2007 [facility] Patient Information 2014 [place], [place].

## 2013-02-11 NOTE — Telephone Encounter (Signed)
Regarding out-patient surgery scheduled 02/16/13, [facility], [place] [address], ICD9 code 354.0: Called insurer [employer] (out of state[place] at both pre-authorization direct ph# [phone] and customer service # [phone], reached automated voice system.  Verified eligibility and benefits, active coverage.  Received faxed copy of benefits, verified patient has surgery benefits.  Unable to access option to leave voice message.  Attempted on-line pre-authorization, pending.

## 2013-02-11 NOTE — Progress Notes (Signed)
Subjective:    Patient ID: Kimberly [name], female    DOB: Apr 25, 1961, 52 y.o.   MRN: [id number]  HPI Comments: This is a 52 year old female referred to me by Dr. Phillips Odor of [facility]. 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 She's had a left rotator cuff repair with a mini open technique did well complains of bilateral shoulder pain unrelated to the hands. She's had cortisone injection in the carpal tunnel, or braces, took Mobic. Nothing has relieved her symptoms she has a positive nerve conduction study indicating bilateral moderate median neuropathy at the wrist. Speaking with her she agrees that nonoperative treatment has not worked she is interested in proceeding with carpal tunnel release. She understands she may have tenderness over her incision for up to 6 months and may only get partial relief but she is at the point of no return relating to her hands. It appears that her shoulders are just overuse syndrome with impingement.  Hand Pain  The incident occurred more than 1 week ago (Approximately one month ago). The injury mechanism was repetitive motion. The pain is present in the left hand and right hand. The quality of the pain is described as aching and burning (Sharp throbbing). The pain does not radiate. The pain is at a severity of 8/10. The pain has been constant since the incident.      Review of Systems  Constitutional: Positive for fatigue. Negative for unexpected weight change.  Respiratory: Positive for shortness of breath.   Genitourinary: Positive for urgency and frequency.  All other systems reviewed and are negative.   Past Medical History  Diagnosis Date  . Hypertension   . COPD (chronic obstructive pulmonary disease)   . Herpes    Past Surgical History  Procedure Laterality Date  . Abdominal hysterectomy    . Rotator cuff repair     History reviewed. No pertinent family  history. History  Substance Use Topics  . Smoking status: Never Smoker   . Smokeless tobacco: Not on file  . Alcohol Use: No   Current Outpatient Prescriptions on File Prior to Visit  Medication Sig Dispense Refill  . amLODipine (NORVASC) 5 MG tablet Take 5 mg by mouth daily.      . budesonide-formoterol (SYMBICORT) 160-4.5 MCG/ACT inhaler Inhale 2 puffs into the lungs 2 (two) times daily.      Marland Kitchen estradiol (ESTRACE) 0.5 MG tablet Take 0.5 mg by mouth daily.        Marland Kitchen ibuprofen (ADVIL,MOTRIN) 600 MG tablet Take 1 tablet (600 mg total) by mouth every 6 (six) hours as needed for pain.  21 tablet  0  . naproxen sodium (ANAPROX) 220 MG tablet Take 220 mg by mouth every 8 (eight) hours as needed. For pain       . valACYclovir (VALTREX) 500 MG tablet Take 500 mg by mouth 2 (two) times daily as needed. For outbreaks        No current facility-administered medications on file prior to visit.        Objective:   Physical Exam BP 136/97  Ht 5\' 1"  (1.549 m)  Wt 151 lb (68.493 kg)  BMI 28.55 kg/m2 General appearance of this patient, normal grooming hygiene and development. Nutritional status normal. Body habitus normal.  She has good peripheral pulses normal temperature no lymphadenopathy gait and station are normal. Her skin is warm dry and intact x4. She  has no coordination deficits reflexes are normal sensation is noted for median nerve distribution altered sensation I would not call it decreased at this point with the fingers in the hand don't fill well on either side she is oriented x3 her mood and affect are normal  She has full range of motion of both shoulders and excellent strength both shoulders are stable with normal muscle tone she does have some impingement  She has bilateral positive Phalen's test. She has tenderness over the carpal tunnel normal range of motion in the hand wrist and hand stable elbow stable both hands have altered power grip  Right and left lower extremity    Inspection and palpation revealed no abnormalities in the upper extremities.   Range of motion is full without contracture.  Motor exam is normal with grade 5 strength.  The joints are fully reduced without subluxation.  There is no atrophy or tremor and muscle tone is normal.  All joints are stable.  Positive nerve conduction studies from Dr. Gerilyn Pilgrim  Left carpal tunnel release and then 3 weeks later right carpal tunnel release       Assessment & Plan:

## 2013-02-12 ENCOUNTER — Encounter (HOSPITAL_COMMUNITY): Payer: Self-pay

## 2013-02-12 ENCOUNTER — Encounter (HOSPITAL_COMMUNITY)
Admission: RE | Admit: 2013-02-12 | Discharge: 2013-02-12 | Disposition: A | Discharge status: Home / Self Care | Payer: BC Managed Care – PPO | Source: Ambulatory Visit | Attending: Orthopedic Surgery | Admitting: Orthopedic Surgery

## 2013-02-12 ENCOUNTER — Other Ambulatory Visit: Payer: Self-pay

## 2013-02-12 ENCOUNTER — Ambulatory Visit: Payer: BC Managed Care – PPO | Admitting: Orthopedic Surgery

## 2013-02-12 LAB — BASIC METABOLIC PANEL
CO2: 29 mEq/L (ref 19–32)
Calcium: 10.4 mg/dL (ref 8.4–10.5)
Chloride: 100 mEq/L (ref 96–112)
Glucose, Bld: 97 mg/dL (ref 70–99)
Sodium: 138 mEq/L (ref 135–145)

## 2013-02-12 NOTE — Patient Instructions (Addendum)
SHERRYLL SKOCZYLAS  02/12/2013   Your procedure is scheduled on:  02/16/2013  Report to [facility] at  830  AM.  Call this number if you have problems the morning of surgery: [phone]   Remember:   Do not eat food or drink liquids after midnight.   Take these medicines the morning of surgery with A SIP OF WATER: norvasc, valtrex. Take your symbicort before you come.   Do not wear jewelry, make-up or nail polish.  Do not wear lotions, powders, or perfumes.  Do not shave 48 hours prior to surgery. Men may shave face and neck.  Do not bring valuables to the hospital.  [facility] is not responsible for any belongings or valuables.  Contacts, dentures or bridgework may not be worn into surgery.  Leave suitcase in the car. After surgery it may be brought to your room.  For patients admitted to the hospital, checkout time is 11:00 AM the day of discharge.   Patients discharged the day of surgery will not be allowed to drive home.  Name and phone number of your driver: family  Special Instructions: Shower using CHG 2 nights before surgery and the night before surgery.  If you shower the day of surgery use CHG.  Use special wash - you have one bottle of CHG for all showers.  You should use approximately 1/3 of the bottle for each shower.   Please read over the following fact sheets that you were given: Pain Booklet, Coughing and Deep Breathing, Surgical Site Infection Prevention, Anesthesia Post-op Instructions and Care and Recovery After Surgery Carpal Tunnel Release Carpal tunnel release is done to relieve the pressure on the nerves and tendons on the bottom side of your wrist.  LET YOUR CAREGIVER KNOW ABOUT:   Allergies to food or medicine.  Medicines taken, including vitamins, herbs, eyedrops, over-the-counter medicines, and creams.  Use of steroids (by mouth or creams).  Previous problems with anesthetics or numbing medicines.  History of bleeding problems or blood  clots.  Previous surgery.  Other health problems, including diabetes and kidney problems.  Possibility of pregnancy, if this applies. RISKS AND COMPLICATIONS  Some problems that may happen after this procedure include:  Infection.  Damage to the nerves, arteries or tendons could occur. This would be very uncommon.  Bleeding. BEFORE THE PROCEDURE   This surgery may be done while you are asleep (general anesthetic) or may be done under a block where only your forearm and the surgical area is numb.  If the surgery is done under a block, the numbness will gradually wear off within several hours after surgery. HOME CARE INSTRUCTIONS   Have a responsible person with you for 24 hours.  Do not drive a car or use public transportation for 24 hours.  Only take over-the-counter or prescription medicines for pain, discomfort, or fever as directed by your caregiver. Take them as directed.  You may put ice on the palm side of the affected wrist.  Put ice in a plastic bag.  Place a towel between your skin and the bag.  Leave the ice on for 20 to 30 minutes, 4 times per day.  If you were given a splint to keep your wrist from bending, use it as directed. It is important to wear the splint at night or as directed. Use the splint for as long as you have pain or numbness in your hand, arm, or wrist. This may take 1 to 2  months.  Keep your hand raised (elevated) above the level of your heart as much as possible. This keeps swelling down and helps with discomfort.  Change bandages (dressings) as directed.  Keep the wound clean and dry. SEEK MEDICAL CARE IF:   You develop pain not relieved with medications.  You develop numbness of your hand.  You develop bleeding from your surgical site.  You have an oral temperature above 102 F (38.9 C).  You develop redness or swelling of the surgical site.  You develop new, unexplained problems. SEEK IMMEDIATE MEDICAL CARE IF:   You develop  a rash.  You have difficulty breathing.  You develop any reaction or side effects to medications given. Document Released: 10/06/2003 Document Revised: 10/08/2011 Document Reviewed: 05/22/2007 [facility] Patient Information 2014 [place], [place]. PATIENT INSTRUCTIONS POST-ANESTHESIA  IMMEDIATELY FOLLOWING SURGERY:  Do not drive or operate machinery for the first twenty four hours after surgery.  Do not make any important decisions for twenty four hours after surgery or while taking narcotic pain medications or sedatives.  If you develop intractable nausea and vomiting or a severe headache please notify your doctor immediately.  FOLLOW-UP:  Please make an appointment with your surgeon as instructed. You do not need to follow up with anesthesia unless specifically instructed to do so.  WOUND CARE INSTRUCTIONS (if applicable):  Keep a dry clean dressing on the anesthesia/puncture wound site if there is drainage.  Once the wound has quit draining you may leave it open to air.  Generally you should leave the bandage intact for twenty four hours unless there is drainage.  If the epidural site drains for more than 36-48 hours please call the anesthesia department.  QUESTIONS?:  Please feel free to call your physician or the hospital operator if you have any questions, and they will be happy to assist you.

## 2013-02-13 NOTE — Telephone Encounter (Addendum)
02/13/13 Still working on [employer] treasurer, [employer] [place] by phone and via online.  Due to all options failing, including contacting patient's employer([employer]) for plan administrator contact information for which we have had No reply back.): Faxed pre-authorization request form, forwarded to [facility] [place], fax# [phone].

## 2013-02-16 ENCOUNTER — Encounter (HOSPITAL_COMMUNITY): Payer: Self-pay | Admitting: Anesthesiology

## 2013-02-16 ENCOUNTER — Encounter (HOSPITAL_COMMUNITY): Payer: Self-pay | Admitting: *Deleted

## 2013-02-16 ENCOUNTER — Ambulatory Visit (HOSPITAL_COMMUNITY): Payer: BC Managed Care – PPO | Admitting: Anesthesiology

## 2013-02-16 ENCOUNTER — Ambulatory Visit (HOSPITAL_COMMUNITY)
Admission: RE | Admit: 2013-02-16 | Discharge: 2013-02-16 | Disposition: A | Discharge status: Home / Self Care | Payer: BC Managed Care – PPO | Source: Ambulatory Visit | Attending: Orthopedic Surgery | Admitting: Orthopedic Surgery

## 2013-02-16 ENCOUNTER — Encounter (HOSPITAL_COMMUNITY)
Admission: RE | Disposition: A | Discharge status: Home / Self Care | Payer: Self-pay | Source: Ambulatory Visit | Attending: Orthopedic Surgery

## 2013-02-16 DIAGNOSIS — I10 Essential (primary) hypertension: Secondary | ICD-10-CM | POA: Insufficient documentation

## 2013-02-16 DIAGNOSIS — G5602 Carpal tunnel syndrome, left upper limb: Secondary | ICD-10-CM

## 2013-02-16 DIAGNOSIS — G5600 Carpal tunnel syndrome, unspecified upper limb: Secondary | ICD-10-CM | POA: Insufficient documentation

## 2013-02-16 DIAGNOSIS — Z01812 Encounter for preprocedural laboratory examination: Secondary | ICD-10-CM | POA: Insufficient documentation

## 2013-02-16 DIAGNOSIS — Z01810 Encounter for preprocedural cardiovascular examination: Secondary | ICD-10-CM | POA: Insufficient documentation

## 2013-02-16 DIAGNOSIS — J449 Chronic obstructive pulmonary disease, unspecified: Secondary | ICD-10-CM | POA: Insufficient documentation

## 2013-02-16 DIAGNOSIS — J4489 Other specified chronic obstructive pulmonary disease: Secondary | ICD-10-CM | POA: Insufficient documentation

## 2013-02-16 DIAGNOSIS — Z79899 Other long term (current) drug therapy: Secondary | ICD-10-CM | POA: Insufficient documentation

## 2013-02-16 HISTORY — PX: CARPAL TUNNEL RELEASE: SHX101

## 2013-02-16 SURGERY — CARPAL TUNNEL RELEASE
Anesthesia: Monitor Anesthesia Care | Site: Wrist | Laterality: Left | Wound class: Clean

## 2013-02-16 MED ORDER — MIDAZOLAM HCL 2 MG/2ML IJ SOLN
INTRAMUSCULAR | Status: AC
  Filled 2013-02-16: qty 2

## 2013-02-16 MED ORDER — SODIUM CHLORIDE 0.9 % IJ SOLN
INTRAMUSCULAR | Status: AC
  Filled 2013-02-16: qty 10

## 2013-02-16 MED ORDER — MIDAZOLAM HCL 2 MG/2ML IJ SOLN
1.0000 mg | INTRAMUSCULAR | Status: DC | PRN
  Administered 2013-02-16: 2 mg via INTRAVENOUS

## 2013-02-16 MED ORDER — PROPOFOL 10 MG/ML IV EMUL
INTRAVENOUS | Status: AC
  Filled 2013-02-16: qty 20

## 2013-02-16 MED ORDER — CEFAZOLIN SODIUM-DEXTROSE 2-3 GM-% IV SOLR
2.0000 g | INTRAVENOUS | Status: AC
  Administered 2013-02-16: 2 g via INTRAVENOUS

## 2013-02-16 MED ORDER — BUPIVACAINE HCL (PF) 0.5 % IJ SOLN
INTRAMUSCULAR | Status: DC | PRN
  Administered 2013-02-16: 10 mL

## 2013-02-16 MED ORDER — LACTATED RINGERS IV SOLN
INTRAVENOUS | Status: DC
  Administered 2013-02-16: 1000 mL via INTRAVENOUS

## 2013-02-16 MED ORDER — FENTANYL CITRATE 0.05 MG/ML IJ SOLN
INTRAMUSCULAR | Status: DC | PRN
  Administered 2013-02-16 (×2): 25 ug via INTRAVENOUS

## 2013-02-16 MED ORDER — HYDROMORPHONE HCL 4 MG PO TABS: 4.0000 mg | ORAL_TABLET | ORAL | Status: DC | PRN

## 2013-02-16 MED ORDER — FENTANYL CITRATE 0.05 MG/ML IJ SOLN
25.0000 ug | INTRAMUSCULAR | Status: AC
  Administered 2013-02-16: 25 ug via INTRAVENOUS

## 2013-02-16 MED ORDER — PROPOFOL INFUSION 10 MG/ML OPTIME
INTRAVENOUS | Status: DC | PRN
  Administered 2013-02-16: 40 ug/kg/min via INTRAVENOUS

## 2013-02-16 MED ORDER — FENTANYL CITRATE 0.05 MG/ML IJ SOLN
25.0000 ug | INTRAMUSCULAR | Status: DC | PRN
  Administered 2013-02-16: 25 ug via INTRAVENOUS

## 2013-02-16 MED ORDER — ONDANSETRON HCL 4 MG/2ML IJ SOLN
INTRAMUSCULAR | Status: AC
  Filled 2013-02-16: qty 2

## 2013-02-16 MED ORDER — ONDANSETRON HCL 4 MG/2ML IJ SOLN
4.0000 mg | Freq: Once | INTRAMUSCULAR | Status: AC | PRN
  Administered 2013-02-16: 4 mg via INTRAVENOUS

## 2013-02-16 MED ORDER — LIDOCAINE HCL (PF) 1 % IJ SOLN
INTRAMUSCULAR | Status: AC
  Filled 2013-02-16: qty 5

## 2013-02-16 MED ORDER — LIDOCAINE HCL (PF) 0.5 % IJ SOLN
INTRAMUSCULAR | Status: AC
  Filled 2013-02-16: qty 50

## 2013-02-16 MED ORDER — CHLORHEXIDINE GLUCONATE 4 % EX LIQD
60.0000 mL | Freq: Once | CUTANEOUS | Status: DC
Start: 2013-02-16 — End: 2013-02-16

## 2013-02-16 MED ORDER — FENTANYL CITRATE 0.05 MG/ML IJ SOLN
INTRAMUSCULAR | Status: AC
  Filled 2013-02-16: qty 2

## 2013-02-16 MED ORDER — LIDOCAINE HCL (PF) 0.5 % IJ SOLN
INTRAMUSCULAR | Status: DC | PRN
  Administered 2013-02-16: 50 mL via INTRAVENOUS

## 2013-02-16 MED ORDER — CEFAZOLIN SODIUM-DEXTROSE 2-3 GM-% IV SOLR
INTRAVENOUS | Status: AC
  Filled 2013-02-16: qty 50

## 2013-02-16 MED ORDER — SODIUM CHLORIDE 0.9 % IR SOLN
Status: DC | PRN
  Administered 2013-02-16: 1000 mL

## 2013-02-16 MED ORDER — BUPIVACAINE HCL (PF) 0.5 % IJ SOLN
INTRAMUSCULAR | Status: AC
  Filled 2013-02-16: qty 30

## 2013-02-16 SURGICAL SUPPLY — 41 items
BAG HAMPER (MISCELLANEOUS) ×2 IMPLANT
BANDAGE ELASTIC 3 VELCRO NS (GAUZE/BANDAGES/DRESSINGS) ×2 IMPLANT
BANDAGE ELASTIC 4 VELCRO NS (GAUZE/BANDAGES/DRESSINGS) ×1 IMPLANT
BANDAGE ESMARK 4X12 BL STRL LF (DISPOSABLE) ×1 IMPLANT
BANDAGE GAUZE ELAST BULKY 4 IN (GAUZE/BANDAGES/DRESSINGS) ×1 IMPLANT
BLADE SURG 15 STRL LF DISP TIS (BLADE) ×1 IMPLANT
BLADE SURG 15 STRL SS (BLADE) ×2
BNDG CMPR 12X4 ELC STRL LF (DISPOSABLE) ×1
BNDG COHESIVE 4X5 TAN NS LF (GAUZE/BANDAGES/DRESSINGS) ×2 IMPLANT
BNDG ESMARK 4X12 BLUE STRL LF (DISPOSABLE) ×2
CHLORAPREP W/TINT 26ML (MISCELLANEOUS) ×2 IMPLANT
CLOTH BEACON ORANGE TIMEOUT ST (SAFETY) ×2 IMPLANT
COVER LIGHT HANDLE STERIS (MISCELLANEOUS) ×4 IMPLANT
CUFF TOURNIQUET SINGLE 18IN (TOURNIQUET CUFF) ×2 IMPLANT
DECANTER SPIKE VIAL GLASS SM (MISCELLANEOUS) ×2 IMPLANT
DRAPE PROXIMA HALF (DRAPES) ×1 IMPLANT
DRSG XEROFORM 1X8 (GAUZE/BANDAGES/DRESSINGS) ×1 IMPLANT
ELECT NDL TIP 2.8 STRL (NEEDLE) ×1 IMPLANT
ELECT NEEDLE TIP 2.8 STRL (NEEDLE) ×2 IMPLANT
ELECT REM PT RETURN 9FT ADLT (ELECTROSURGICAL) ×2
ELECTRODE REM PT RTRN 9FT ADLT (ELECTROSURGICAL) ×1 IMPLANT
GLOVE BIOGEL PI IND STRL 7.0 (GLOVE) ×1 IMPLANT
GLOVE BIOGEL PI INDICATOR 7.0 (GLOVE) ×1
GLOVE EXAM NITRILE LRG STRL (GLOVE) ×1 IMPLANT
GLOVE SKINSENSE NS SZ8.0 LF (GLOVE) ×1
GLOVE SKINSENSE STRL SZ8.0 LF (GLOVE) ×1 IMPLANT
GLOVE SS BIOGEL STRL SZ 6.5 (GLOVE) IMPLANT
GLOVE SS N UNI LF 8.5 STRL (GLOVE) ×2 IMPLANT
GLOVE SUPERSENSE BIOGEL SZ 6.5 (GLOVE) ×1
GOWN STRL REIN XL XLG (GOWN DISPOSABLE) ×6 IMPLANT
HAND ALUMI XLG (SOFTGOODS) ×2 IMPLANT
KIT ROOM TURNOVER APOR (KITS) ×2 IMPLANT
MANIFOLD NEPTUNE II (INSTRUMENTS) ×2 IMPLANT
NEEDLE HYPO 21X1.5 SAFETY (NEEDLE) ×2 IMPLANT
NS IRRIG 1000ML POUR BTL (IV SOLUTION) ×2 IMPLANT
PACK BASIC LIMB (CUSTOM PROCEDURE TRAY) ×2 IMPLANT
PAD ARMBOARD 7.5X6 YLW CONV (MISCELLANEOUS) ×2 IMPLANT
SET BASIN LINEN APH (SET/KITS/TRAYS/PACK) ×2 IMPLANT
SPONGE GAUZE 4X4 12PLY (GAUZE/BANDAGES/DRESSINGS) ×1 IMPLANT
SUT ETHILON 3 0 FSL (SUTURE) ×2 IMPLANT
SYR CONTROL 10ML LL (SYRINGE) ×2 IMPLANT

## 2013-02-16 NOTE — Anesthesia Procedure Notes (Signed)
Anesthesia Regional Block:  Bier block ([facility])  Pre-Anesthetic Checklist: ,, timeout performed, Correct Patient, Correct Site, Correct Laterality, Correct Procedure,, site marked, surgical consent,, at surgeon's request Needles:  Injection technique: Single-shot  Needle Type: Other      Needle Gauge: 22 and 22 G    Additional Needles: Bier block ([facility])  Nerve Stimulator or Paresthesia:   Additional Responses:  Pulse checked post tourniquet inflation. IV NSL discontinued post injection. Narrative:   Performed by: Personally   Bier block ([facility])

## 2013-02-16 NOTE — H&P (View-Only) (Signed)
Subjective:    Patient ID: Kimberly [name], female    DOB: 10/23/1960, 51 y.o.   MRN: [id number]  HPI Comments: This is a 51-year-old female referred to me by Dr. Golding of [facility]. 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 She's had a left rotator cuff repair with a mini open technique did well complains of bilateral shoulder pain unrelated to the hands. She's had cortisone injection in the carpal tunnel, or braces, took Mobic. Nothing has relieved her symptoms she has a positive nerve conduction study indicating bilateral moderate median neuropathy at the wrist. Speaking with her she agrees that nonoperative treatment has not worked she is interested in proceeding with carpal tunnel release. She understands she may have tenderness over her incision for up to 6 months and may only get partial relief but she is at the point of no return relating to her hands. It appears that her shoulders are just overuse syndrome with impingement.  Hand Pain  The incident occurred more than 1 week ago (Approximately one month ago). The injury mechanism was repetitive motion. The pain is present in the left hand and right hand. The quality of the pain is described as aching and burning (Sharp throbbing). The pain does not radiate. The pain is at a severity of 8/10. The pain has been constant since the incident.      Review of Systems  Constitutional: Positive for fatigue. Negative for unexpected weight change.  Respiratory: Positive for shortness of breath.   Genitourinary: Positive for urgency and frequency.  All other systems reviewed and are negative.   Past Medical History  Diagnosis Date  . Hypertension   . COPD (chronic obstructive pulmonary disease)   . Herpes    Past Surgical History  Procedure Laterality Date  . Abdominal hysterectomy    . Rotator cuff repair     History reviewed. No pertinent family  history. History  Substance Use Topics  . Smoking status: Never Smoker   . Smokeless tobacco: Not on file  . Alcohol Use: No   Current Outpatient Prescriptions on File Prior to Visit  Medication Sig Dispense Refill  . amLODipine (NORVASC) 5 MG tablet Take 5 mg by mouth daily.      . budesonide-formoterol (SYMBICORT) 160-4.5 MCG/ACT inhaler Inhale 2 puffs into the lungs 2 (two) times daily.      . estradiol (ESTRACE) 0.5 MG tablet Take 0.5 mg by mouth daily.        . ibuprofen (ADVIL,MOTRIN) 600 MG tablet Take 1 tablet (600 mg total) by mouth every 6 (six) hours as needed for pain.  21 tablet  0  . naproxen sodium (ANAPROX) 220 MG tablet Take 220 mg by mouth every 8 (eight) hours as needed. For pain       . valACYclovir (VALTREX) 500 MG tablet Take 500 mg by mouth 2 (two) times daily as needed. For outbreaks        No current facility-administered medications on file prior to visit.        Objective:   Physical Exam BP 136/97  Ht 5' 1" (1.549 m)  Wt 151 lb (68.493 kg)  BMI 28.55 kg/m2 General appearance of this patient, normal grooming hygiene and development. Nutritional status normal. Body habitus normal.  She has good peripheral pulses normal temperature no lymphadenopathy gait and station are normal. Her skin is warm dry and intact x4. She   has no coordination deficits reflexes are normal sensation is noted for median nerve distribution altered sensation I would not call it decreased at this point with the fingers in the hand don't fill well on either side she is oriented x3 her mood and affect are normal  She has full range of motion of both shoulders and excellent strength both shoulders are stable with normal muscle tone she does have some impingement  She has bilateral positive Phalen's test. She has tenderness over the carpal tunnel normal range of motion in the hand wrist and hand stable elbow stable both hands have altered power grip  Right and left lower extremity    Inspection and palpation revealed no abnormalities in the upper extremities.   Range of motion is full without contracture.  Motor exam is normal with grade 5 strength.  The joints are fully reduced without subluxation.  There is no atrophy or tremor and muscle tone is normal.  All joints are stable.  Positive nerve conduction studies from Dr. Doonquah  Left carpal tunnel release and then 3 weeks later right carpal tunnel release       Assessment & Plan:   

## 2013-02-16 NOTE — Transfer of Care (Signed)
Immediate Anesthesia Transfer of Care Note  Patient: Kimberly [name]  Procedure(s) Performed: Procedure(s): CARPAL TUNNEL RELEASE (Left)  Patient Location: PACU  Anesthesia Type:Bier block  Level of Consciousness: awake, alert  and oriented  Airway & Oxygen Therapy: Patient Spontanous Breathing and Patient connected to nasal cannula oxygen  Post-op Assessment: Report given to PACU RN  Post vital signs: Reviewed and stable  Complications: No apparent anesthesia complications

## 2013-02-16 NOTE — Interval H&P Note (Signed)
History and Physical Interval Note:  02/16/2013 10:01 AM  Kimberly [name]  has presented today for surgery, with the diagnosis of Left Carpal Tunnel Syndrome  The various methods of treatment have been discussed with the patient and family. After consideration of risks, benefits and other options for treatment, the patient has consented to  Procedure(s): CARPAL TUNNEL RELEASE (Left) as a surgical intervention .  The patient's history has been reviewed, patient examined, no change in status, stable for surgery.  I have reviewed the patient's chart and labs.  Questions were answered to the patient's satisfaction.     Fuller Canada

## 2013-02-16 NOTE — Anesthesia Postprocedure Evaluation (Signed)
  Anesthesia Post-op Note  Patient: Kimberly [name]  Procedure(s) Performed: Procedure(s): CARPAL TUNNEL RELEASE (Left)  Patient Location: PACU  Anesthesia Type:Bier block  Level of Consciousness: awake, alert  and oriented  Airway and Oxygen Therapy: Patient Spontanous Breathing  Post-op Pain: none  Post-op Assessment: Post-op Vital signs reviewed, Patient's Cardiovascular Status Stable, Respiratory Function Stable, Patent Airway and No signs of Nausea or vomiting  Post-op Vital Signs: Reviewed and stable  Complications: No apparent anesthesia complications

## 2013-02-16 NOTE — Anesthesia Preprocedure Evaluation (Signed)
Anesthesia Evaluation  Patient identified by MRN, date of birth, ID band Patient awake    Reviewed: Allergy & Precautions, H&P , NPO status , Patient's Chart, lab work & pertinent test results  History of Anesthesia Complications Negative for: history of anesthetic complications  Airway Mallampati: II TM Distance: >3 FB Neck ROM: Full    Dental  (+) Teeth Intact   Pulmonary COPD (symbicort Rx) breath sounds clear to auscultation        Cardiovascular hypertension, Pt. on medications Rhythm:Regular Rate:Normal     Neuro/Psych    GI/Hepatic   Endo/Other    Renal/GU      Musculoskeletal   Abdominal   Peds  Hematology   Anesthesia Other Findings   Reproductive/Obstetrics                           Anesthesia Physical Anesthesia Plan  ASA: II  Anesthesia Plan: MAC and Bier Block   Post-op Pain Management:    Induction:   Airway Management Planned: Nasal Cannula  Additional Equipment:   Intra-op Plan:   Post-operative Plan:   Informed Consent: I have reviewed the patients History and Physical, chart, labs and discussed the procedure including the risks, benefits and alternatives for the proposed anesthesia with the patient or authorized representative who has indicated his/her understanding and acceptance.     Plan Discussed with:   Anesthesia Plan Comments:         Anesthesia Quick Evaluation

## 2013-02-16 NOTE — Op Note (Signed)
02/16/2013  11:07 AM  PATIENT:  Kimberly [name]  51 y.o. female  PRE-OPERATIVE DIAGNOSIS:  Left Carpal Tunnel Syndrome  POST-OPERATIVE DIAGNOSIS:  Left Carpal Tunnel Syndrome  Operative findings: Hypertrophy of the venous structures of the ulnar artery, no space occupying lesion mild deformity of the nerve  Operative note RIGHT carpal tunnel release date of surgery February 14, 2011.  Antibiotics were ordered and the patient was given antibiotics and taken to surgery.  In the surgical suite a Bier block was done and then the arm was prepped and draped in sterile technique.  At this point the timeout procedure was initiated and completed.  The incision was made over the left carpal tunnel and extended to the palmar fascia.  The palmar fascia was divided bluntly and dissection was carried out to the distal aspect of the transverse carpal ligament.  Blunt dissection was carried out beneath the ligament and sharp dissection was used to release the ligament.  The contents of the carpal tunnel were inspected and found to be free of any other pathology.  Irrigation was performed.  Closure was with interrupted 3-0 nylon suture.  10 cc of plain Sensorcaine was injected around the incision edges.  A sterile bandage was applied.  The tourniquet was released the color of the fingers was normal and the capillary refill was normal.  The patient is transferred to Recovery area and will be discharged home.  PROCEDURE:  Procedure(s): CARPAL TUNNEL RELEASE (Left)  SURGEON:  Surgeon(s) and Role:    * Montague Corella E Parisa Pinela, MD - Primary  PHYSICIAN ASSISTANT:   ASSISTANTS: none   ANESTHESIA:   regional  EBL:  Total I/O In: 200 [I.V.:200] Out: -   BLOOD ADMINISTERED:none  DRAINS: none   LOCAL MEDICATIONS USED:  MARCAINE 0.5% plain   and Amount: 10 ml  SPECIMEN:  No Specimen and Source of Specimen:  none  DISPOSITION OF SPECIMEN:  N/A  COUNTS:  YES  TOURNIQUET:   Total Tourniquet Time  Documented: Upper Arm (Left) - 26 minutes Total: Upper Arm (Left) - 26 minutes   DICTATION: .Dragon Dictation  PLAN OF CARE: Discharge to home after PACU  PATIENT DISPOSITION:  PACU - hemodynamically stable.   Delay start of Pharmacological VTE agent (>24hrs) due to surgical blood loss or risk of bleeding: not applicable  

## 2013-02-16 NOTE — Telephone Encounter (Signed)
Per [employer] representative Luciana Axe, in response to faxed request, sent fax response Friday,02/13/13, received today, Monday, 02/16/13, no pre-authorization required.

## 2013-02-16 NOTE — Brief Op Note (Signed)
02/16/2013  11:07 AM  PATIENT:  Kimberly [name]  52 y.o. female  PRE-OPERATIVE DIAGNOSIS:  Left Carpal Tunnel Syndrome  POST-OPERATIVE DIAGNOSIS:  Left Carpal Tunnel Syndrome  Operative findings: Hypertrophy of the venous structures of the ulnar artery, no space occupying lesion mild deformity of the nerve  Operative note RIGHT carpal tunnel release date of surgery February 14, 2011.  Antibiotics were ordered and the patient was given antibiotics and taken to surgery.  In the surgical suite a Bier block was done and then the arm was prepped and draped in sterile technique.  At this point the timeout procedure was initiated and completed.  The incision was made over the left carpal tunnel and extended to the palmar fascia.  The palmar fascia was divided bluntly and dissection was carried out to the distal aspect of the transverse carpal ligament.  Blunt dissection was carried out beneath the ligament and sharp dissection was used to release the ligament.  The contents of the carpal tunnel were inspected and found to be free of any other pathology.  Irrigation was performed.  Closure was with interrupted 3-0 nylon suture.  10 cc of plain Sensorcaine was injected around the incision edges.  A sterile bandage was applied.  The tourniquet was released the color of the fingers was normal and the capillary refill was normal.  The patient is transferred to Recovery area and will be discharged home.  PROCEDURE:  Procedure(s): CARPAL TUNNEL RELEASE (Left)  SURGEON:  Surgeon(s) and Role:    * Vickki Hearing, MD - Primary  PHYSICIAN ASSISTANT:   ASSISTANTS: none   ANESTHESIA:   regional  EBL:  Total I/O In: 200 [I.V.:200] Out: -   BLOOD ADMINISTERED:none  DRAINS: none   LOCAL MEDICATIONS USED:  MARCAINE 0.5% plain   and Amount: 10 ml  SPECIMEN:  No Specimen and Source of Specimen:  none  DISPOSITION OF SPECIMEN:  N/A  COUNTS:  YES  TOURNIQUET:   Total Tourniquet Time  Documented: Upper Arm (Left) - 26 minutes Total: Upper Arm (Left) - 26 minutes   DICTATION: .Reubin Milan Dictation  PLAN OF CARE: Discharge to home after PACU  PATIENT DISPOSITION:  PACU - hemodynamically stable.   Delay start of Pharmacological VTE agent (>24hrs) due to surgical blood loss or risk of bleeding: not applicable

## 2013-02-18 ENCOUNTER — Encounter (HOSPITAL_COMMUNITY): Payer: Self-pay | Admitting: Orthopedic Surgery

## 2013-02-19 ENCOUNTER — Ambulatory Visit (INDEPENDENT_AMBULATORY_CARE_PROVIDER_SITE_OTHER): Payer: BC Managed Care – PPO | Admitting: Orthopedic Surgery

## 2013-02-19 ENCOUNTER — Encounter: Payer: Self-pay | Admitting: Orthopedic Surgery

## 2013-02-19 VITALS — BP 142/98 | Ht 61.0 in | Wt 151.0 lb

## 2013-02-19 DIAGNOSIS — G5600 Carpal tunnel syndrome, unspecified upper limb: Secondary | ICD-10-CM | POA: Insufficient documentation

## 2013-02-19 DIAGNOSIS — Z9889 Other specified postprocedural states: Secondary | ICD-10-CM

## 2013-02-19 DIAGNOSIS — G5602 Carpal tunnel syndrome, left upper limb: Secondary | ICD-10-CM

## 2013-02-19 MED ORDER — TRAMADOL-ACETAMINOPHEN 37.5-325 MG PO TABS: 1.0000 | ORAL_TABLET | ORAL | Status: DC | PRN

## 2013-02-19 NOTE — Progress Notes (Signed)
Patient ID: Kimberly [name], female   DOB: 10/28/1960, 52 y.o.   MRN: [id number] Chief Complaint  Patient presents with  . Follow-up    Post op 1 Left CTR DOS 02/16/13     Postop visit #1 status post carpal tunnel release of the left hand   The incision is clean dry and intact. Good capillary refill is noted at the fingertips.  Pain is controlled with Tylenol it but it's causing her sickness she also had codeine allergy so we put her on Ultracet Recommend hand exercises which were demonstrated for the patient.  Keep wound clean and dry  Come back postop day 14 for suture removal

## 2013-02-19 NOTE — Patient Instructions (Signed)
Start hand exercises as instructed  Keep wound clean and dry  Return to have sutures removed

## 2013-03-02 ENCOUNTER — Other Ambulatory Visit: Payer: Self-pay | Admitting: *Deleted

## 2013-03-02 ENCOUNTER — Ambulatory Visit (INDEPENDENT_AMBULATORY_CARE_PROVIDER_SITE_OTHER): Payer: BC Managed Care – PPO | Admitting: Orthopedic Surgery

## 2013-03-02 VITALS — BP 125/85 | Ht 61.0 in | Wt 151.0 lb

## 2013-03-02 DIAGNOSIS — Z9889 Other specified postprocedural states: Secondary | ICD-10-CM

## 2013-03-02 NOTE — Progress Notes (Signed)
Patient ID: Kimberly [name], female   DOB: [birth year], 52 y.o.   MRN: [id number] Chief Complaint  Patient presents with  . Follow-up    Post op #2, left CTR. DOS 02-16-13. Suture removal today.    BP 125/85  Ht 5\' 1"  (1.549 m)  Wt 151 lb (68.493 kg)  BMI 28.55 kg/m2  Doing well sutures are removed Steri-Strips are applied numbness and tingling have resolved range of motion is improving recommend strengthening exercises return in 3 weeks to check her left wrist  She would like to have a right carpal tunnel release which we will do at the end of the month

## 2013-03-02 NOTE — Patient Instructions (Addendum)
Patient will be scheduled for Right CTR 03/27/13

## 2013-03-05 ENCOUNTER — Encounter (HOSPITAL_COMMUNITY): Payer: Self-pay | Admitting: Pharmacy Technician

## 2013-03-16 ENCOUNTER — Encounter: Payer: Self-pay | Admitting: Orthopedic Surgery

## 2013-03-16 ENCOUNTER — Ambulatory Visit (INDEPENDENT_AMBULATORY_CARE_PROVIDER_SITE_OTHER): Payer: BC Managed Care – PPO | Admitting: Orthopedic Surgery

## 2013-03-16 VITALS — BP 130/84 | Ht 61.0 in | Wt 151.0 lb

## 2013-03-16 DIAGNOSIS — Z9889 Other specified postprocedural states: Secondary | ICD-10-CM

## 2013-03-16 NOTE — Patient Instructions (Addendum)
Cancel 25th appt   Right CTR aug 29th, post op Sept 2nd

## 2013-03-16 NOTE — Progress Notes (Signed)
Patient ID: Kimberly [name], female   DOB: [birth year], 52 y.o.   MRN: [id number] Chief Complaint  Patient presents with  . Follow-up    Check incision Post op  Left CTR  DOS 02/16/13    Will recheck left carpal tunnel release postop day 28 some dry skin is noted at the incision no sign of infection she does not have pain numbness tingling no loss of motion doing well. Scheduled for August 29 carpal tunnel release on the right and a postop appointment September 2

## 2013-03-18 ENCOUNTER — Encounter (HOSPITAL_COMMUNITY): Payer: Self-pay | Admitting: *Deleted

## 2013-03-18 ENCOUNTER — Emergency Department (HOSPITAL_COMMUNITY)
Admission: EM | Admit: 2013-03-18 | Discharge: 2013-03-18 | Disposition: A | Discharge status: Home / Self Care | Payer: BC Managed Care – PPO | Attending: Emergency Medicine | Admitting: Emergency Medicine

## 2013-03-18 DIAGNOSIS — IMO0002 Reserved for concepts with insufficient information to code with codable children: Secondary | ICD-10-CM | POA: Insufficient documentation

## 2013-03-18 DIAGNOSIS — Z79899 Other long term (current) drug therapy: Secondary | ICD-10-CM | POA: Insufficient documentation

## 2013-03-18 DIAGNOSIS — J449 Chronic obstructive pulmonary disease, unspecified: Secondary | ICD-10-CM | POA: Insufficient documentation

## 2013-03-18 DIAGNOSIS — Z5189 Encounter for other specified aftercare: Secondary | ICD-10-CM

## 2013-03-18 DIAGNOSIS — Z4801 Encounter for change or removal of surgical wound dressing: Secondary | ICD-10-CM | POA: Insufficient documentation

## 2013-03-18 DIAGNOSIS — J4489 Other specified chronic obstructive pulmonary disease: Secondary | ICD-10-CM | POA: Insufficient documentation

## 2013-03-18 DIAGNOSIS — Z8619 Personal history of other infectious and parasitic diseases: Secondary | ICD-10-CM | POA: Insufficient documentation

## 2013-03-18 DIAGNOSIS — I10 Essential (primary) hypertension: Secondary | ICD-10-CM | POA: Insufficient documentation

## 2013-03-18 NOTE — ED Notes (Signed)
Pt states surgery for carpel tunnel to left hand/wrist on July 21. States that incision sit opened up a few minutes PTA. No bleeding noted. Wound appears to have healed well.

## 2013-03-18 NOTE — ED Provider Notes (Signed)
CSN: [id number]     Arrival date & time 03/18/13  1256 History     First MD Initiated Contact with Patient 03/18/13 1327     Chief Complaint  Patient presents with  . Hand Pain   (Consider location/radiation/quality/duration/timing/severity/associated sxs/prior Treatment) HPI Comments: Pt had surgery on the left wrist July 21. She was working with a Advertising account planner when her wrist incision came open. No bleeding. No change in hand function.  Patient is a 52 y.o. female presenting with wound check. The history is provided by the patient.  Wound Check This is a new problem. The current episode started today. The problem occurs constantly. The problem has been unchanged. Pertinent negatives include no abdominal pain, arthralgias, chest pain, coughing, fever, nausea, neck pain or numbness. Exacerbated by: Palpation and range of motion of the wrist. She has tried nothing for the symptoms.    Past Medical History  Diagnosis Date  . Hypertension   . COPD (chronic obstructive pulmonary disease)   . Herpes    Past Surgical History  Procedure Laterality Date  . Abdominal hysterectomy    . Rotator cuff repair Left   . Foreign body removal Left     knee-as child  . Carpal tunnel release Left 02/16/2013    Procedure: CARPAL TUNNEL RELEASE;  Surgeon: Vickki Hearing, MD;  Location: [facility];  Service: Orthopedics;  Laterality: Left;   No family history on file. History  Substance Use Topics  . Smoking status: Never Smoker   . Smokeless tobacco: Not on file  . Alcohol Use: No   OB History   Grav Para Term Preterm Abortions TAB SAB Ect Mult Living                 Review of Systems  Constitutional: Negative for fever and activity change.       All ROS Neg except as noted in HPI  HENT: Negative for nosebleeds and neck pain.   Eyes: Negative for photophobia and discharge.  Respiratory: Negative for cough, shortness of breath and wheezing.   Cardiovascular: Negative for chest pain and  palpitations.  Gastrointestinal: Negative for nausea, abdominal pain and blood in stool.  Genitourinary: Negative for dysuria, frequency and hematuria.  Musculoskeletal: Negative for back pain and arthralgias.  Skin: Negative.   Neurological: Negative for dizziness, seizures, speech difficulty and numbness.  Psychiatric/Behavioral: Negative for hallucinations and confusion.    Allergies  Peanut-containing drug products; Shellfish allergy; and Codeine  Home Medications   Current Outpatient Rx  Name  Route  Sig  Dispense  Refill  . amLODipine (NORVASC) 5 MG tablet   Oral   Take 5 mg by mouth daily.         . budesonide-formoterol (SYMBICORT) 160-4.5 MCG/ACT inhaler   Inhalation   Inhale 2 puffs into the lungs 2 (two) times daily.         Marland Kitchen estradiol (ESTRACE) 1 MG tablet   Oral   Take 1 mg by mouth daily.         . traMADol-acetaminophen (ULTRACET) 37.5-325 MG per tablet   Oral   Take 1 tablet by mouth every 4 (four) hours as needed for pain.   90 tablet   5   . valACYclovir (VALTREX) 500 MG tablet   Oral   Take 500 mg by mouth 2 (two) times daily as needed. For outbreaks           Pulse 90  Temp(Src) 98 F (36.7 C) (Oral)  Resp  20  Ht 5\' 1"  (1.549 m)  Wt 148 lb (67.132 kg)  BMI 27.98 kg/m2  SpO2 98% Physical Exam  Nursing note and vitals reviewed. Constitutional: She is oriented to person, place, and time. She appears well-developed and well-nourished.  Non-toxic appearance.  HENT:  Head: Normocephalic.  Right Ear: Tympanic membrane and external ear normal.  Left Ear: Tympanic membrane and external ear normal.  Eyes: EOM and lids are normal. Pupils are equal, round, and reactive to light.  Neck: Normal range of motion. Neck supple. Carotid bruit is not present.  Cardiovascular: Normal rate, regular rhythm, normal heart sounds, intact distal pulses and normal pulses.   Pulmonary/Chest: Breath sounds normal. No respiratory distress.  Abdominal: Soft.  Bowel sounds are normal. There is no tenderness. There is no guarding.  Musculoskeletal: Normal range of motion.  The surgical site of the left hand is progressing nicely. The top layer of skin has not sealed. No bleeding noted.  FROM of all fingers. Cap refill wnl.  Lymphadenopathy:       Head (right side): No submandibular adenopathy present.       Head (left side): No submandibular adenopathy present.    She has no cervical adenopathy.  Neurological: She is alert and oriented to person, place, and time. She has normal strength. No cranial nerve deficit or sensory deficit.  Skin: Skin is warm and dry.  Psychiatric: She has a normal mood and affect. Her speech is normal.    ED Course   Procedures (including critical care time)  Labs Reviewed - No data to display No results found. No diagnosis found.  MDM  *I have reviewed nursing notes, vital signs, and all appropriate lab and imaging results for this patient.** Explained to the patient that the wound is healing nicely. No significant changes or problem noted. Pt to return if any problem.  Kathie Dike, PA-C 03/18/13 1352

## 2013-03-19 NOTE — Patient Instructions (Addendum)
Your procedure is scheduled on: 03/27/2013  Report to [facility] at   6:15  AM.  Call this number if you have problems the morning of surgery: [phone]   Remember:   Do not drink or eat food:After Midnight.  :  Take these medicines the morning of surgery with A SIP OF WATER: Amlodipine, and use Symbicort inhaler   Do not wear jewelry, make-up or nail polish.  Do not wear lotions, powders, or perfumes. You may wear deodorant.  Do not shave 48 hours prior to surgery. Men may shave face and neck.  Do not bring valuables to the hospital.  Contacts, dentures or bridgework may not be worn into surgery.  Leave suitcase in the car. After surgery it may be brought to your room.  For patients admitted to the hospital, checkout time is 11:00 AM the day of discharge.   Patients discharged the day of surgery will not be allowed to drive home.    Special Instructions: Shower using CHG 2 nights before surgery and the night before surgery.  If you shower the day of surgery use CHG.  Use special wash - you have one bottle of CHG for all showers.  You should use approximately 1/3 of the bottle for each shower.   Please read over the following fact sheets that you were given: Pain Booklet, MRSA Information, Surgical Site Infection Prevention and Care and Recovery After Surgery  Carpal Tunnel Surgery The carpal tunnel is a narrow hollow area in the wrist. It is formed by the wrist bones and ligaments. Nerves, blood vessels, and tendons on the palm side of your hand pass through the carpal tunnel. (The palm side is the side of your hand in the direction your fingers bend.) Repeated wrist motion or certain diseases may cause swelling within the tunnel. That is why these are sometimes called repetitive trauma disorders. It is also a common problem in late pregnancy because of water retention. This swelling pinches the main nerve in the wrist (median nerve). It causes the painful condition called carpal tunnel  syndrome. A feeling of "pins and needles" may be noticed in the fingers or hand. The entire arm may ache from this condition. Carpal tunnel syndrome may clear up by itself. Cortisone injections may help. An electromyogram may be needed to confirm this diagnosis. This is a test which measures nerve conduction. The nerve conduction is usually slowed in a carpal tunnel syndrome. Sometimes, an operation may be needed to free the pinched nerve.  LET YOUR CAREGIVER KNOW ABOUT:  Allergies  Medications taken including herbs, eye drops, over the counter medications, and creams  Use of steroids (by mouth or creams)  Previous problems with anesthetics or novocaine  Possibility of pregnancy, if this applies  History of blood clots (thrombophlebitis)  History of bleeding or blood problems  Previous surgery  Other health problems RISKS AND COMPLICATIONS  Infection: A germ starts growing in the wound. This can usually be treated with antibiotics.  Damage to other organs may occur.  Bleeding following surgery can be a complication of almost all surgeries. Your surgeon takes every precaution to keep this from happening.  Recurrence (return) of carpal tunnel syndrome following treatment is rare. BEFORE THE PROCEDURE  Stop smoking at least two weeks prior to surgery. This lowers risk during surgery.  Stop non steroidal medicine for ten days prior to surgery. Also, do not take aspirin unless OK'd by your surgeon.  Your caregiver may tell you to stop taking  certain medicine that may affect the outcome of the surgery and your ability to heal. For example, you may need to stop taking anti-inflammatories, such as aspirin, because of possible bleeding problems. Other medicine may have interactions with anesthesia.  BE SURE TO LET YOUR CAREGIVER KNOW IF YOU HAVE BEEN ON STEROIDS (INCLUDING CREAMS) FOR LONG PERIODS OF TIME. THIS IS CRITICAL.  Your caregiver will discuss possible risks and complications  with you before surgery. In addition to the usual risks of anesthesia, other common risks and complications include:  Temporary increase in pain due to surgery.  Uncorrected pain.  Infection. You should be present 60 minutes before your procedure or as directed.  PROCEDURE  Carpal tunnel release is generally recommended if symptoms last for 6 months. Surgery involves severing the band of tissue around the wrist to reduce pressure on the median nerve. Surgery is done under local anesthesia and does not require an overnight hospital stay. Many patients require surgery on both hands. The following are types of carpal tunnel release surgery:  Open release surgery, the traditional procedure used to correct carpal tunnel syndrome, consists of making an incision up to 2 inches in the wrist and then cutting the carpal ligament to enlarge the carpal tunnel. The procedure is generally done under local anesthesia on an outpatient basis, unless there are unusual medical considerations.  Endoscopic surgery may allow faster functional recovery and less post-operative discomfort than traditional open release surgery. The surgeon makes two incisions (cuts) (about 1/2 inch each) in the wrist and palm, inserts a camera attached to a tube, looks at the tissue on a screen, and cuts the carpal ligament (the tissue that holds joints together). This two-portal endoscopic surgery, generally performed under local anesthesia, is effective and minimizes scarring and scar tenderness, if any. One-portal endoscopic surgery for carpal tunnel syndrome is also available. Although symptoms may be better right after surgery, full recovery from carpal tunnel surgery can take months. Some patients may have infection, nerve damage, stiffness, and pain at the scar. Sometimes the wrist loses strength because the carpal ligament is cut. Patients should take part in physical therapy after surgery to restore wrist strength. Some patients may  need to adjust job duties or even change jobs after recovery from surgery. The majority of patients recover completely without complications (additional problems). AFTER THE PROCEDURE After surgery, you will be taken to the recovery area where a nurse will watch and check your progress. Once you're awake, stable, and taking fluids well, without other problems you will be allowed to go home. HOME CARE INSTRUCTIONS   Once at home, an ice pack applied to your operative site may help with discomfort and keep the swelling down.  Follow your caregiver's instructions as to activities, exercises, physical therapy, and driving a car.  Maintain strength and range of motion as instructed.  If you were given a splint to keep your wrist from bending, use it as instructed. It is important to wear the splint at night. Use the splint for as long as you have pain or numbness in your hand, arm or wrist. This may take 1 to 2 months.  If you have pain at night, it may help to elevate your hand above the level of your heart (the center of your chest).  It is important to avoid activities which originally caused your carpal tunnel syndrome for a couple weeks following surgery, or as directed by your surgeon. If your symptoms are work-related, you may need to talk  to your employer about changing to a job that does not require using your wrist.  Only take over-the-counter or prescription medicines for pain, discomfort, or fever as directed by your caregiver.  Following periods of extended use, particularly hard (strenuous) use, apply an ice pack wrapped in a towel to the palm (anterior) side of the affected wrist for 20 to 30 minutes. Repeat as needed three to four times per day. This will help reduce swelling following surgery. SEEK MEDICAL CARE IF:   There is increased bleeding (more than a small spot) from the wound.  You notice redness, swelling, or increasing pain in the wound.  Pus is coming from  wound.  An unexplained oral temperature above 102 F (38.9 C) develops.  You notice a foul smell coming from the wound or dressing. SEEK IMMEDIATE MEDICAL CARE IF:   You develop a rash.  You have difficulty breathing.  You have any problems you think are related to allergies. Document Released: 02/28/2004 Document Revised: 10/08/2011 Document Reviewed: 05/22/2007 [facility] Patient Information 2014 [place], [place]. Monitored Anesthesia Care (MAC)  MAC stands for monitored anesthesia care. MAC usually means a tube is not put in your trachea (windpipe). MAC may also be called moderate sedation. MAC usually involves giving intravenous anesthetic drugs, oxygen, watching vital signs and standard patient monitoring procedures similar to those used during a general anesthetic. MAC can be done without going to the operating room. MAC is typically used for small procedures that cannot be done with only local anesthesia. MAC usually means lower doses of anesthetic drugs. The recovery period tends to be shorter. The drugs used cause a lower level of awareness. This means you are partially awake and your reflexes are intact. You may hear what is being said and feel some pressure, but should not feel pain. The drugs used may affect your ability to remember the procedure. If you have depressed consciousness and lose some protective reflexes, this is called deep sedation. If you become unconscious and fall completely asleep, this is general anesthesia. In both deep sedation and general anesthesia, the caregivers must make sure that your airway remains open. During MAC, the sedation-trained caregivers will:  Give medications which may include:  Sedatives.  Analgesics.  Hypnotics.  Other medications which are needed to keep you comfortable, safe and secure.  Give local anesthetic to numb the procedural site.  Monitor your level of consciousness.  Monitor your blood pressure.  Monitor your heart  rate and rhythm.  Monitor your respirations and oxygen levels.  Monitor your airway.  Monitor your level of pain.  Evaluate and treat problems which may occur. Document Released: 04/11/2005 Document Revised: 10/08/2011 Document Reviewed: 06/14/2009 [facility] Patient Information 2014 [place], [place].

## 2013-03-19 NOTE — ED Provider Notes (Signed)
Medical screening examination/treatment/procedure(s) were performed by non-physician practitioner and as supervising physician I was immediately available for consultation/collaboration.   Amandamarie Feggins W. Alleah Dearman, MD 03/19/13 0747 

## 2013-03-20 ENCOUNTER — Encounter (HOSPITAL_COMMUNITY): Payer: Self-pay

## 2013-03-20 ENCOUNTER — Encounter (HOSPITAL_COMMUNITY)
Admission: RE | Admit: 2013-03-20 | Discharge: 2013-03-20 | Disposition: A | Discharge status: Home / Self Care | Payer: BC Managed Care – PPO | Source: Ambulatory Visit | Attending: Orthopedic Surgery | Admitting: Orthopedic Surgery

## 2013-03-20 DIAGNOSIS — Z01812 Encounter for preprocedural laboratory examination: Secondary | ICD-10-CM | POA: Insufficient documentation

## 2013-03-20 LAB — HEMOGLOBIN AND HEMATOCRIT, BLOOD
HCT: 41.2 % (ref 36.0–46.0)
Hemoglobin: 13.6 g/dL (ref 12.0–15.0)

## 2013-03-20 LAB — BASIC METABOLIC PANEL
Chloride: 100 mEq/L (ref 96–112)
GFR calc Af Amer: >90 mL/min (ref >90)
Potassium: 3.8 mEq/L (ref 3.5–5.1)

## 2013-03-23 ENCOUNTER — Ambulatory Visit: Payer: BC Managed Care – PPO | Admitting: Orthopedic Surgery

## 2013-03-26 NOTE — H&P (Signed)
HPI Comments: This is a 52 year old female referred to me by Dr. Phillips Odor of [facility]. 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 She's had a left rotator cuff repair with a mini open technique did well complains of bilateral shoulder pain unrelated to the hands. She's had cortisone injection in the carpal tunnel, or braces, took Mobic. Nothing has relieved her symptoms she has a positive nerve conduction study indicating bilateral moderate median neuropathy at the wrist. Speaking with her she agrees that nonoperative treatment has not worked she is interested in proceeding with carpal tunnel release. She understands she may have tenderness over her incision for up to 6 months and may only get partial relief but she is at the point of no return relating to her hands. It appears that her shoulders are just overuse syndrome with impingement.  Hand Pain   The incident occurred more than 1 week ago (Approximately one month ago). The injury mechanism was repetitive motion. The pain is present in the left hand and right hand. The quality of the pain is described as aching and burning (Sharp throbbing). The pain does not radiate. The pain is at a severity of 8/10. The pain has been constant since the incident.      Review of Systems  Constitutional: Positive for fatigue. Negative for unexpected weight change.  Respiratory: Positive for shortness of breath.   Genitourinary: Positive for urgency and frequency.  All other systems reviewed and are negative.    Past Medical History   Diagnosis  Date   .  Hypertension     .  COPD (chronic obstructive pulmonary disease)     .  Herpes        Past Surgical History   Procedure  Laterality  Date   .  Abdominal hysterectomy       .  Rotator cuff repair        History reviewed. No pertinent family history. History   Substance Use Topics   .  Smoking status:  Never Smoker     .  Smokeless tobacco:  Not on file   .  Alcohol Use:  No      Current Outpatient Prescriptions on File Prior to Visit   Medication  Sig  Dispense  Refill   .  amLODipine (NORVASC) 5 MG tablet  Take 5 mg by mouth daily.         .  budesonide-formoterol (SYMBICORT) 160-4.5 MCG/ACT inhaler  Inhale 2 puffs into the lungs 2 (two) times daily.         Marland Kitchen  estradiol (ESTRACE) 0.5 MG tablet  Take 0.5 mg by mouth daily.           Marland Kitchen  ibuprofen (ADVIL,MOTRIN) 600 MG tablet  Take 1 tablet (600 mg total) by mouth every 6 (six) hours as needed for pain.   21 tablet   0   .  naproxen sodium (ANAPROX) 220 MG tablet  Take 220 mg by mouth every 8 (eight) hours as needed. For pain          .  valACYclovir (VALTREX) 500 MG tablet  Take 500 mg by mouth 2 (two) times daily as needed. For outbreaks             No current facility-administered medications on file prior to visit.          Objective:    Physical  Exam BP 136/97  Ht 5\' 1"  (1.549 m)  Wt 151 lb (68.493 kg)  BMI 28.55 kg/m2 General appearance of this patient, normal grooming hygiene and development. Nutritional status normal. Body habitus normal.  She has good peripheral pulses normal temperature no lymphadenopathy gait and station are normal. Her skin is warm dry and intact x4. She has no coordination deficits reflexes are normal sensation is noted for median nerve distribution altered sensation I would not call it decreased at this point with the fingers in the hand don't fill well on either side she is oriented x3 her mood and affect are normal  She has full range of motion of both shoulders and excellent strength both shoulders are stable with normal muscle tone she does have some impingement  She has bilateral positive Phalen's test. She has tenderness over the carpal tunnel normal range of motion in the hand wrist and hand stable elbow stable both hands have altered power grip  Right and left lower extremity    Inspection and palpation  revealed no abnormalities in the upper extremities.    Range of motion is full without contracture.  Motor exam is normal with grade 5 strength.  The joints are fully reduced without subluxation.  There is no atrophy or tremor and muscle tone is normal.  All joints are stable.  Positive nerve conduction studies from Dr. Gerilyn Pilgrim   She did very well with the first carpal tunnel release  Today she will proceed with a right carpal tunnel release

## 2013-03-27 ENCOUNTER — Ambulatory Visit (HOSPITAL_COMMUNITY): Payer: BC Managed Care – PPO | Admitting: Anesthesiology

## 2013-03-27 ENCOUNTER — Encounter (HOSPITAL_COMMUNITY)
Admission: RE | Disposition: A | Discharge status: Home / Self Care | Payer: Self-pay | Source: Ambulatory Visit | Attending: Orthopedic Surgery

## 2013-03-27 ENCOUNTER — Ambulatory Visit (HOSPITAL_COMMUNITY)
Admission: RE | Admit: 2013-03-27 | Discharge: 2013-03-27 | Disposition: A | Discharge status: Home / Self Care | Payer: BC Managed Care – PPO | Source: Ambulatory Visit | Attending: Orthopedic Surgery | Admitting: Orthopedic Surgery

## 2013-03-27 ENCOUNTER — Encounter (HOSPITAL_COMMUNITY): Payer: Self-pay | Admitting: Anesthesiology

## 2013-03-27 ENCOUNTER — Encounter (HOSPITAL_COMMUNITY): Payer: Self-pay | Admitting: *Deleted

## 2013-03-27 DIAGNOSIS — G5601 Carpal tunnel syndrome, right upper limb: Secondary | ICD-10-CM

## 2013-03-27 DIAGNOSIS — I10 Essential (primary) hypertension: Secondary | ICD-10-CM | POA: Insufficient documentation

## 2013-03-27 DIAGNOSIS — G5600 Carpal tunnel syndrome, unspecified upper limb: Secondary | ICD-10-CM

## 2013-03-27 DIAGNOSIS — J4489 Other specified chronic obstructive pulmonary disease: Secondary | ICD-10-CM | POA: Insufficient documentation

## 2013-03-27 DIAGNOSIS — J449 Chronic obstructive pulmonary disease, unspecified: Secondary | ICD-10-CM | POA: Insufficient documentation

## 2013-03-27 HISTORY — PX: CARPAL TUNNEL RELEASE: SHX101

## 2013-03-27 SURGERY — CARPAL TUNNEL RELEASE
Anesthesia: Monitor Anesthesia Care | Site: Wrist | Laterality: Right | Wound class: Clean

## 2013-03-27 MED ORDER — FENTANYL CITRATE 0.05 MG/ML IJ SOLN
INTRAMUSCULAR | Status: AC
  Filled 2013-03-27: qty 2

## 2013-03-27 MED ORDER — PROPOFOL INFUSION 10 MG/ML OPTIME
INTRAVENOUS | Status: DC | PRN
  Administered 2013-03-27: 100 ug/kg/min via INTRAVENOUS

## 2013-03-27 MED ORDER — MIDAZOLAM HCL 2 MG/2ML IJ SOLN
1.0000 mg | INTRAMUSCULAR | Status: DC | PRN
  Administered 2013-03-27: 2 mg via INTRAVENOUS

## 2013-03-27 MED ORDER — LIDOCAINE HCL (PF) 0.5 % IJ SOLN
INTRAMUSCULAR | Status: AC
  Filled 2013-03-27: qty 50

## 2013-03-27 MED ORDER — PROPOFOL 10 MG/ML IV EMUL
INTRAVENOUS | Status: AC
  Filled 2013-03-27: qty 20

## 2013-03-27 MED ORDER — ONDANSETRON HCL 4 MG/2ML IJ SOLN: 4.0000 mg | Freq: Once | INTRAMUSCULAR | Status: DC | PRN

## 2013-03-27 MED ORDER — FENTANYL CITRATE 0.05 MG/ML IJ SOLN: 25.0000 ug | INTRAMUSCULAR | Status: DC | PRN

## 2013-03-27 MED ORDER — CEFAZOLIN SODIUM-DEXTROSE 2-3 GM-% IV SOLR
INTRAVENOUS | Status: AC
  Filled 2013-03-27: qty 50

## 2013-03-27 MED ORDER — FENTANYL CITRATE 0.05 MG/ML IJ SOLN
INTRAMUSCULAR | Status: DC | PRN
  Administered 2013-03-27: 25 ug via INTRAVENOUS
  Administered 2013-03-27 (×2): 12.5 ug via INTRAVENOUS

## 2013-03-27 MED ORDER — FENTANYL CITRATE 0.05 MG/ML IJ SOLN
25.0000 ug | INTRAMUSCULAR | Status: AC
Start: 2013-03-27 — End: 2013-03-27
  Administered 2013-03-27 (×2): 25 ug via INTRAVENOUS

## 2013-03-27 MED ORDER — TRAMADOL-ACETAMINOPHEN 37.5-325 MG PO TABS: 1.0000 | ORAL_TABLET | ORAL | Status: DC | PRN

## 2013-03-27 MED ORDER — SODIUM CHLORIDE 0.9 % IJ SOLN
INTRAMUSCULAR | Status: AC
  Filled 2013-03-27: qty 10

## 2013-03-27 MED ORDER — SODIUM CHLORIDE 0.9 % IR SOLN
Status: DC | PRN
  Administered 2013-03-27: 1000 mL

## 2013-03-27 MED ORDER — CEFAZOLIN SODIUM-DEXTROSE 2-3 GM-% IV SOLR
2.0000 g | INTRAVENOUS | Status: AC
  Administered 2013-03-27: 2 g via INTRAVENOUS

## 2013-03-27 MED ORDER — LACTATED RINGERS IV SOLN
INTRAVENOUS | Status: DC
  Administered 2013-03-27: 1000 mL via INTRAVENOUS

## 2013-03-27 MED ORDER — CHLORHEXIDINE GLUCONATE 4 % EX LIQD: 60.0000 mL | Freq: Once | CUTANEOUS | Status: DC

## 2013-03-27 MED ORDER — LIDOCAINE HCL (CARDIAC) 10 MG/ML IV SOLN
INTRAVENOUS | Status: DC | PRN
  Administered 2013-03-27: 20 mg via INTRAVENOUS

## 2013-03-27 MED ORDER — MIDAZOLAM HCL 2 MG/2ML IJ SOLN
INTRAMUSCULAR | Status: AC
  Filled 2013-03-27: qty 2

## 2013-03-27 MED ORDER — LIDOCAINE HCL (PF) 0.5 % IJ SOLN
INTRAMUSCULAR | Status: DC | PRN
  Administered 2013-03-27: 50 mL via INTRAVENOUS

## 2013-03-27 MED ORDER — BUPIVACAINE HCL (PF) 0.5 % IJ SOLN
INTRAMUSCULAR | Status: AC
  Filled 2013-03-27: qty 30

## 2013-03-27 MED ORDER — BUPIVACAINE HCL (PF) 0.5 % IJ SOLN
INTRAMUSCULAR | Status: DC | PRN
  Administered 2013-03-27: 10 mL

## 2013-03-27 SURGICAL SUPPLY — 40 items
BAG HAMPER (MISCELLANEOUS) ×2 IMPLANT
BANDAGE ELASTIC 3 VELCRO NS (GAUZE/BANDAGES/DRESSINGS) ×2 IMPLANT
BANDAGE ESMARK 4X12 BL STRL LF (DISPOSABLE) ×1 IMPLANT
BANDAGE GAUZE ELAST BULKY 4 IN (GAUZE/BANDAGES/DRESSINGS) ×1 IMPLANT
BLADE SURG 15 STRL LF DISP TIS (BLADE) ×1 IMPLANT
BLADE SURG 15 STRL SS (BLADE) ×2
BNDG CMPR 12X4 ELC STRL LF (DISPOSABLE) ×1
BNDG COHESIVE 4X5 TAN NS LF (GAUZE/BANDAGES/DRESSINGS) ×2 IMPLANT
BNDG ESMARK 4X12 BLUE STRL LF (DISPOSABLE) ×2
CHLORAPREP W/TINT 26ML (MISCELLANEOUS) ×2 IMPLANT
CLOTH BEACON ORANGE TIMEOUT ST (SAFETY) ×2 IMPLANT
COVER LIGHT HANDLE STERIS (MISCELLANEOUS) ×4 IMPLANT
CUFF TOURNIQUET SINGLE 18IN (TOURNIQUET CUFF) ×2 IMPLANT
DECANTER SPIKE VIAL GLASS SM (MISCELLANEOUS) ×2 IMPLANT
DRAPE PROXIMA HALF (DRAPES) ×1 IMPLANT
DRSG XEROFORM 1X8 (GAUZE/BANDAGES/DRESSINGS) ×2 IMPLANT
ELECT NEEDLE TIP 2.8 STRL (NEEDLE) ×2 IMPLANT
ELECT REM PT RETURN 9FT ADLT (ELECTROSURGICAL) ×2
ELECTRODE REM PT RTRN 9FT ADLT (ELECTROSURGICAL) ×1 IMPLANT
GLOVE BIOGEL PI IND STRL 7.0 (GLOVE) IMPLANT
GLOVE BIOGEL PI INDICATOR 7.0 (GLOVE) ×1
GLOVE EXAM NITRILE MD LF STRL (GLOVE) ×1 IMPLANT
GLOVE SKINSENSE NS SZ8.0 LF (GLOVE) ×1
GLOVE SKINSENSE STRL SZ8.0 LF (GLOVE) ×1 IMPLANT
GLOVE SS BIOGEL STRL SZ 6.5 (GLOVE) IMPLANT
GLOVE SS N UNI LF 8.5 STRL (GLOVE) ×2 IMPLANT
GLOVE SUPERSENSE BIOGEL SZ 6.5 (GLOVE) ×1
GOWN STRL REIN XL XLG (GOWN DISPOSABLE) ×5 IMPLANT
HAND ALUMI XLG (SOFTGOODS) ×2 IMPLANT
KIT ROOM TURNOVER APOR (KITS) ×2 IMPLANT
MANIFOLD NEPTUNE II (INSTRUMENTS) ×2 IMPLANT
NDL HYPO 21X1.5 SAFETY (NEEDLE) ×1 IMPLANT
NEEDLE HYPO 21X1.5 SAFETY (NEEDLE) ×2 IMPLANT
NS IRRIG 1000ML POUR BTL (IV SOLUTION) ×2 IMPLANT
PACK BASIC LIMB (CUSTOM PROCEDURE TRAY) ×2 IMPLANT
PAD ARMBOARD 7.5X6 YLW CONV (MISCELLANEOUS) ×2 IMPLANT
SET BASIN LINEN APH (SET/KITS/TRAYS/PACK) ×2 IMPLANT
SPONGE GAUZE 4X4 12PLY (GAUZE/BANDAGES/DRESSINGS) ×1 IMPLANT
SUT ETHILON 3 0 FSL (SUTURE) ×2 IMPLANT
SYR CONTROL 10ML LL (SYRINGE) ×2 IMPLANT

## 2013-03-27 NOTE — Transfer of Care (Signed)
Immediate Anesthesia Transfer of Care Note  Patient: Kimberly [name]  Procedure(s) Performed: Procedure(s) (LRB): RIGHT CARPAL TUNNEL RELEASE (Right)  Patient Location: PACU  Anesthesia Type: MAC  Level of Consciousness: awake  Airway & Oxygen Therapy: Patient Spontanous Breathing. Nasal cannula  Post-op Assessment: Report given to PACU RN, Post -op Vital signs reviewed and stable and Patient moving all extremities  Post vital signs: Reviewed and stable  Complications: No apparent anesthesia complications

## 2013-03-27 NOTE — Addendum Note (Signed)
Addendum created 03/27/13 1124 by Franco Nones, CRNA   Modules edited: Anesthesia Medication Administration

## 2013-03-27 NOTE — Addendum Note (Signed)
Addendum created 03/27/13 1116 by Franco Nones, CRNA   Modules edited: Anesthesia Blocks and Procedures, Anesthesia LDA

## 2013-03-27 NOTE — Anesthesia Preprocedure Evaluation (Signed)
Anesthesia Evaluation  Patient identified by MRN, date of birth, ID band Patient awake    Reviewed: Allergy & Precautions, H&P , NPO status , Patient's Chart, lab work & pertinent test results  History of Anesthesia Complications Negative for: history of anesthetic complications  Airway Mallampati: II TM Distance: >3 FB Neck ROM: Full    Dental  (+) Teeth Intact   Pulmonary COPD (symbicort Rx) breath sounds clear to auscultation        Cardiovascular hypertension, Pt. on medications Rhythm:Regular Rate:Normal     Neuro/Psych    GI/Hepatic   Endo/Other    Renal/GU      Musculoskeletal   Abdominal   Peds  Hematology   Anesthesia Other Findings   Reproductive/Obstetrics                           Anesthesia Physical Anesthesia Plan  ASA: II  Anesthesia Plan: MAC and Bier Block   Post-op Pain Management:    Induction:   Airway Management Planned: Nasal Cannula  Additional Equipment:   Intra-op Plan:   Post-operative Plan:   Informed Consent: I have reviewed the patients History and Physical, chart, labs and discussed the procedure including the risks, benefits and alternatives for the proposed anesthesia with the patient or authorized representative who has indicated his/her understanding and acceptance.     Plan Discussed with:   Anesthesia Plan Comments:         Anesthesia Quick Evaluation  

## 2013-03-27 NOTE — Brief Op Note (Signed)
03/27/2013  10:48 AM  PATIENT:  Arnette Schaumann  52 y.o. female  PRE-OPERATIVE DIAGNOSIS:  Right Carpal Tunnel Syndrome  POST-OPERATIVE DIAGNOSIS:  Right Carpal Tunnel Syndrome  PROCEDURE:  Procedure(s): RIGHT CARPAL TUNNEL RELEASE (Right)  SURGEON:  Surgeon(s) and Role:    * Vickki Hearing, MD - Primary  PHYSICIAN ASSISTANT:   ASSISTANTS: none   ANESTHESIA:   regional  EBL:  Total I/O In: 300 [I.V.:300] Out: 0   BLOOD ADMINISTERED:none  DRAINS: none   LOCAL MEDICATIONS USED:  MARCAINE     SPECIMEN:  No Specimen  DISPOSITION OF SPECIMEN:  N/A  COUNTS:  YES  TOURNIQUET:   Total Tourniquet Time Documented: Upper Arm (Right) - 25 minutes Total: Upper Arm (Right) - 25 minutes   DICTATION: .Reubin Milan Dictation  PLAN OF CARE: discharge today  PATIENT DISPOSITION:  PACU - hemodynamically stable.   Delay start of Pharmacological VTE agent (>24hrs) due to surgical blood loss or risk of bleeding: yes

## 2013-03-27 NOTE — Anesthesia Procedure Notes (Addendum)
Procedure Name: MAC Date/Time: 03/27/2013 10:14 AM Performed by: Franco Nones Pre-anesthesia Checklist: Patient identified, Emergency Drugs available, Suction available, Timeout performed and Patient being monitored Patient Re-evaluated:Patient Re-evaluated prior to inductionOxygen Delivery Method: Nasal Cannula    Anesthesia Regional Block:  Bier block (IV Regional)  Pre-Anesthetic Checklist: ,, timeout performed, Correct Patient, Correct Site, Correct Laterality, Correct Procedure,, site marked, surgical consent,, at surgeon's request  Laterality: Right     Needles:  Injection technique: Single-shot  Needle Type: Other      Needle Gauge: 22 and 22 G    Additional Needles: Bier block (IV Regional)  Nerve Stimulator or Paresthesia:   Additional Responses:  Pulse checked post tourniquet inflation. IV NSL discontinued post injection. Narrative:  Start time: 03/27/2013 10:15 AM End time: 03/27/2013 10:20 AM  Performed by: Personally   Bier block (IV Regional) Procedure Name: MAC Date/Time: 03/27/2013 10:30 AM Performed by: Franco Nones Pre-anesthesia Checklist: Patient identified, Emergency Drugs available, Suction available, Timeout performed and Patient being monitored Patient Re-evaluated:Patient Re-evaluated prior to inductionOxygen Delivery Method: Non-rebreather mask

## 2013-03-27 NOTE — Interval H&P Note (Signed)
History and Physical Interval Note:  03/27/2013 9:59 AM  Kimberly [name]  has presented today for surgery, with the diagnosis of Right Carpal Tunnel Syndrome  The various methods of treatment have been discussed with the patient and family. After consideration of risks, benefits and other options for treatment, the patient has consented to  Procedure(s): CARPAL TUNNEL RELEASE (Right) as a surgical intervention .  The patient's history has been reviewed, patient examined, no change in status, stable for surgery.  I have reviewed the patient's chart and labs.  Questions were answered to the patient's satisfaction.     Fuller Canada

## 2013-03-27 NOTE — Anesthesia Postprocedure Evaluation (Signed)
Anesthesia Post Note  Patient: Kimberly [name]  Procedure(s) Performed: Procedure(s) (LRB): RIGHT CARPAL TUNNEL RELEASE (Right)  Anesthesia type: MAC  Patient location: PACU  Post pain: Pain level controlled  Post assessment: Post-op Vital signs reviewed, Patient's Cardiovascular Status Stable, Respiratory Function Stable, Patent Airway, No signs of Nausea or vomiting and Pain level controlled  Last Vitals:  Filed Vitals:   03/27/13 1052  BP: 113/79  Pulse: 83  Temp: 36.6 C  Resp: 16    Post vital signs: Reviewed and stable  Level of consciousness: awake and alert   Complications: No apparent anesthesia complications

## 2013-03-27 NOTE — Op Note (Signed)
Operative report  Date of surgery 8.29.2014 Preop diagnosis carpal tunnel syndrome right wrist Postop diagnosis same Procedure open carpal tunnel release right wrist Surgeon Romeo Apple Anesthesia regional Bier block Findings compressed median nerve with discoloration the nerve was gray in color and flat in appearance no space-occupying lesions were seen  Indications for procedure failed conservative treatment of carpal tunnel syndrome   informed consent was done in the office.  The patient was identified in the preop area the right wrist was confirmed as a surgical site marked the chart was updated  The patient was taken to surgery appropriate antibiotic was started. After successful Bier block the right upper extremity was prepped and draped with sterile technique. The timeout procedure was completed and all parties involved were in agreement that we were doing a right carpal tunnel release  An  incision was made over the carpal tunnel and taken down to the palmar fascia ; the distal aspect of the carpal tunnel was identified, blunt dissection was carried out beneath the ligament. The transverse carpal ligament was then released  The carpal tunnel contents were inspected found to be free of any space-occupying lesions. There was some adhesions around the median nerve and the nerve was flattened and discolored with a gray appearance  The wound was irrigated and then closed with 3-0 nylon suture in interrupted fashion.  10 cc of plain Marcaine was placed around the wound and a sterile bandage was applied  The tourniquet was released and had good color and the patient was taken to the recovery room in stable condition  Followup is scheduled next week, the patient will be released to home today.

## 2013-03-31 ENCOUNTER — Encounter (HOSPITAL_COMMUNITY): Payer: Self-pay | Admitting: Orthopedic Surgery

## 2013-03-31 ENCOUNTER — Encounter: Payer: Self-pay | Admitting: Orthopedic Surgery

## 2013-03-31 ENCOUNTER — Ambulatory Visit (INDEPENDENT_AMBULATORY_CARE_PROVIDER_SITE_OTHER): Payer: BC Managed Care – PPO | Admitting: Orthopedic Surgery

## 2013-03-31 VITALS — BP 116/76 | Ht 61.0 in | Wt 151.0 lb

## 2013-03-31 DIAGNOSIS — Z9889 Other specified postprocedural states: Secondary | ICD-10-CM

## 2013-03-31 DIAGNOSIS — G5600 Carpal tunnel syndrome, unspecified upper limb: Secondary | ICD-10-CM

## 2013-03-31 DIAGNOSIS — G5601 Carpal tunnel syndrome, right upper limb: Secondary | ICD-10-CM

## 2013-03-31 NOTE — Progress Notes (Signed)
Patient ID: Kimberly [name], female   DOB: Oct 16, 1960, 52 y.o.   MRN: [id number]  Chief Complaint  Patient presents with  . Follow-up    Post op 1 Right CTR DOS 03/27/13    Check carpal tunnel release right hand status post carpal tunnel release left hand doing well wound looks good dressing change encouraged to start active range of motion exercises followup for suture removal

## 2013-03-31 NOTE — Patient Instructions (Addendum)
Keep moving fingers

## 2013-04-09 ENCOUNTER — Encounter: Payer: Self-pay | Admitting: Orthopedic Surgery

## 2013-04-09 ENCOUNTER — Ambulatory Visit (INDEPENDENT_AMBULATORY_CARE_PROVIDER_SITE_OTHER): Payer: BC Managed Care – PPO | Admitting: Orthopedic Surgery

## 2013-04-09 VITALS — BP 125/76 | Ht 61.0 in | Wt 151.0 lb

## 2013-04-09 DIAGNOSIS — Z9889 Other specified postprocedural states: Secondary | ICD-10-CM

## 2013-04-09 NOTE — Patient Instructions (Signed)
Keep clean and dry x 1 week

## 2013-04-09 NOTE — Progress Notes (Signed)
Patient ID: Kimberly [name], female   DOB: 03/24/1961, 52 y.o.   MRN: [id number] Postoperative visit  Procedure: (Right  CTR)  Date of procedure  Chief Complaint  Patient presents with  . Follow-up    Post op 2 CTR right DOS 03/27/13    Incision is clean   Symptoms have  resolved   Range of motion is improving   Work on flexion   Return in 4 weeks

## 2013-04-30 ENCOUNTER — Encounter: Payer: Self-pay | Admitting: Orthopedic Surgery

## 2013-04-30 ENCOUNTER — Ambulatory Visit (INDEPENDENT_AMBULATORY_CARE_PROVIDER_SITE_OTHER): Payer: BC Managed Care – PPO | Admitting: Orthopedic Surgery

## 2013-04-30 VITALS — BP 126/89 | Ht 61.0 in | Wt 151.0 lb

## 2013-04-30 DIAGNOSIS — Z9889 Other specified postprocedural states: Secondary | ICD-10-CM

## 2013-04-30 NOTE — Patient Instructions (Addendum)
Take 3 toothbrushes soft, medium, and firm Rub incision 2 weeks with soft, then 2 weeks with medium, and then 2 weeks with firm to help with tenderness  OOW note for 4 weeks

## 2013-04-30 NOTE — Progress Notes (Signed)
Patient ID: Kimberly [name], female   DOB: Nov 21, 1960, 52 y.o.   MRN: [id number]  Chief Complaint  Patient presents with  . Follow-up    3 week recheck left Carpal Tunnel Release    Status post bilateral carpal tunnel releases  Both incisions are clean dry and intact her right wrist incision is tender. We've instructed her on desensitization techniques using a toothbrush technique  Work on range of motion exercises.  Her numbness and tingling have gone away and she's just having some incisional soreness  She will work on her strengthening and then see [place] again in 4 weeks she'll continue with her current work status

## 2013-05-28 ENCOUNTER — Encounter: Payer: Self-pay | Admitting: Orthopedic Surgery

## 2013-05-28 ENCOUNTER — Ambulatory Visit (INDEPENDENT_AMBULATORY_CARE_PROVIDER_SITE_OTHER): Payer: BC Managed Care – PPO | Admitting: Orthopedic Surgery

## 2013-05-28 VITALS — Ht 61.0 in | Wt 151.0 lb

## 2013-05-28 DIAGNOSIS — Z9889 Other specified postprocedural states: Secondary | ICD-10-CM

## 2013-05-28 NOTE — Patient Instructions (Signed)
Continue to use hard tooth brush OOW until Nov 30th

## 2013-05-28 NOTE — Progress Notes (Signed)
Patient ID: Kimberly [name], female   DOB: 1960/11/13, 52 y.o.   MRN: [id number]  Chief Complaint  Patient presents with  . Follow-up    4 week recheck on right CTR, DOS 03-27-13.    She also had left carpal tunnel release prior to this. The left hand doing well she says the right wrist incision hurts but she has no numbness or tingling in her right hand and has full range of motion  She should continue desensitization with a hard toothbrush  Follow up in 4 weeks out of work for more weeks

## 2013-06-04 ENCOUNTER — Ambulatory Visit (INDEPENDENT_AMBULATORY_CARE_PROVIDER_SITE_OTHER): Payer: BC Managed Care – PPO | Admitting: Orthopedic Surgery

## 2013-06-04 VITALS — BP 124/85 | Ht 61.0 in | Wt 151.0 lb

## 2013-06-04 DIAGNOSIS — Z9889 Other specified postprocedural states: Secondary | ICD-10-CM

## 2013-06-04 DIAGNOSIS — G5600 Carpal tunnel syndrome, unspecified upper limb: Secondary | ICD-10-CM

## 2013-06-04 NOTE — Patient Instructions (Signed)
Keep 06/29/13 appointment

## 2013-06-04 NOTE — Progress Notes (Signed)
Patient ID: Kimberly [name], female   DOB: Jun 14, 1961, 52 y.o.   MRN: [id number]  Chief Complaint  Patient presents with  . Follow-up    Swelling in both hands   Follow-up      4 week recheck on right CTR, DOS 03-27-13.   The patient's husband daughter hands are swollen and she couldn't get her ring on this morning so she comes in for evaluation status post bilateral carpal tunnel releases. The skin wrinkles well she has full range of motion there is no redness or tenderness not sure if they were swollen this morning and the swelling is gone down but I don't see any swelling now  Continue current treatment of desensitization of the incision in followup on December 1 as previously noted

## 2013-06-29 ENCOUNTER — Encounter: Payer: Self-pay | Admitting: Orthopedic Surgery

## 2013-06-29 ENCOUNTER — Ambulatory Visit (INDEPENDENT_AMBULATORY_CARE_PROVIDER_SITE_OTHER): Payer: BC Managed Care – PPO | Admitting: Orthopedic Surgery

## 2013-06-29 VITALS — BP 138/82 | Ht 61.0 in | Wt 151.0 lb

## 2013-06-29 DIAGNOSIS — Z9889 Other specified postprocedural states: Secondary | ICD-10-CM

## 2013-06-29 NOTE — Patient Instructions (Signed)
RTW 07-06-13

## 2013-06-29 NOTE — Progress Notes (Signed)
Patient ID: Kimberly [name], female   DOB: 12-21-1960, 52 y.o.   MRN: [id number]  Chief Complaint  Patient presents with  . Follow-up    one month recheck bilateral carpal tunnel releases rt 03/27/13 and lt 02/16/13    History left carpal tunnel release July 21 and right carpal tunnel release August 29 complains of sensitivity over the right scar  No swelling full range of motion normal strength does have some tingling when rubbing the incision on the right left  She can return to work. If she needs FMLA papers filled out for when her hand is bothering her we can do that but she she will go back to work on Monday

## 2013-07-02 ENCOUNTER — Encounter (HOSPITAL_COMMUNITY): Payer: Self-pay | Admitting: Emergency Medicine

## 2013-07-02 ENCOUNTER — Emergency Department (HOSPITAL_COMMUNITY)
Admission: EM | Admit: 2013-07-02 | Discharge: 2013-07-02 | Disposition: A | Discharge status: Home / Self Care | Payer: BC Managed Care – PPO | Attending: Emergency Medicine | Admitting: Emergency Medicine

## 2013-07-02 ENCOUNTER — Emergency Department (HOSPITAL_COMMUNITY): Payer: BC Managed Care – PPO

## 2013-07-02 DIAGNOSIS — R0789 Other chest pain: Secondary | ICD-10-CM

## 2013-07-02 DIAGNOSIS — IMO0002 Reserved for concepts with insufficient information to code with codable children: Secondary | ICD-10-CM | POA: Insufficient documentation

## 2013-07-02 DIAGNOSIS — F411 Generalized anxiety disorder: Secondary | ICD-10-CM | POA: Insufficient documentation

## 2013-07-02 DIAGNOSIS — I10 Essential (primary) hypertension: Secondary | ICD-10-CM | POA: Insufficient documentation

## 2013-07-02 DIAGNOSIS — R071 Chest pain on breathing: Secondary | ICD-10-CM | POA: Insufficient documentation

## 2013-07-02 DIAGNOSIS — Z8619 Personal history of other infectious and parasitic diseases: Secondary | ICD-10-CM | POA: Insufficient documentation

## 2013-07-02 DIAGNOSIS — Z79899 Other long term (current) drug therapy: Secondary | ICD-10-CM | POA: Insufficient documentation

## 2013-07-02 DIAGNOSIS — J441 Chronic obstructive pulmonary disease with (acute) exacerbation: Secondary | ICD-10-CM | POA: Insufficient documentation

## 2013-07-02 LAB — D-DIMER, QUANTITATIVE: D-Dimer, Quant: 0.28 ug/mL-FEU (ref 0.00–0.48)

## 2013-07-02 LAB — TROPONIN I: Troponin I: <0.3 ng/mL (ref <0.30)

## 2013-07-02 MED ORDER — KETOROLAC TROMETHAMINE 30 MG/ML IJ SOLN
30.0000 mg | Freq: Once | INTRAMUSCULAR | Status: AC
Start: 2013-07-02 — End: 2013-07-02
  Administered 2013-07-02: 30 mg via INTRAMUSCULAR
  Filled 2013-07-02: qty 1

## 2013-07-02 MED ORDER — IBUPROFEN 200 MG PO TABS: 400.0000 mg | ORAL_TABLET | Freq: Four times a day (QID) | ORAL | Status: DC | PRN

## 2013-07-02 MED ORDER — DIAZEPAM 5 MG PO TABS
5.0000 mg | ORAL_TABLET | Freq: Once | ORAL | Status: AC
Start: 2013-07-02 — End: 2013-07-02
  Administered 2013-07-02: 5 mg via ORAL
  Filled 2013-07-02: qty 1

## 2013-07-02 MED ORDER — HYDROCODONE-ACETAMINOPHEN 5-325 MG PO TABS
1.0000 | ORAL_TABLET | Freq: Once | ORAL | Status: AC
  Administered 2013-07-02: 1 via ORAL
  Filled 2013-07-02: qty 1

## 2013-07-02 NOTE — ED Provider Notes (Signed)
CSN: [id number]     Arrival date & time 07/02/13  0719 History  This chart was scribed for Shon Baton, MD by Quintella Reichert, ED scribe.  This patient was seen in room APA19/APA19 and the patient's care was started at 7:38 AM.  Chief Complaint  Patient presents with  . Chest Pain    The history is provided by the patient. No language interpreter was used.    HPI Comments: Kimberly [name] is a 52 y.o. female with h/o COPD, HTN and possible hypercholesteremia (per pt) who presents to the Emergency Department complaining of severe stabbing left-sided chest pain that awoke her from sleep this morning at 3 AM.  Pt states she was woken up by a "stabbing" pain which was shortly after followed by severe "muscle spasms" in the left side of her chest that are worsened by movement.  Pain is also worsened by breathing and she notes associated SOB.  Pain is currently rated at 9/10.  She has attempted to treat pain with ibuprofen, without relief.  She denies leg swelling, fevers, cough, nausea, vomiting, or abdominal pain.  Pt denies prior h/o similar pain and states she does not think her current symptoms are related to her COPD.  She denies recent hospitalization.  She denies family h/o early heart disease.  She has never smoked and does not know what caused her COPD.    Past Medical History  Diagnosis Date  . Hypertension   . COPD (chronic obstructive pulmonary disease)   . Herpes     Past Surgical History  Procedure Laterality Date  . Abdominal hysterectomy    . Rotator cuff repair Left   . Foreign body removal Left     knee-as child  . Carpal tunnel release Left 02/16/2013    Procedure: CARPAL TUNNEL RELEASE;  Surgeon: Vickki Hearing, MD;  Location: [facility];  Service: Orthopedics;  Laterality: Left;  . Carpal tunnel release Right 03/27/2013    Procedure: RIGHT CARPAL TUNNEL RELEASE;  Surgeon: Vickki Hearing, MD;  Location: [facility];  Service: Orthopedics;  Laterality: Right;     No family history on file.   History  Substance Use Topics  . Smoking status: Never Smoker   . Smokeless tobacco: Not on file  . Alcohol Use: No    OB History   Grav Para Term Preterm Abortions TAB SAB Ect Mult Living                  Review of Systems  Constitutional: Negative for fever.  Respiratory: Positive for shortness of breath. Negative for cough.   Cardiovascular: Positive for chest pain. Negative for leg swelling.  Gastrointestinal: Negative for nausea, vomiting and abdominal pain.  Musculoskeletal: Negative for back pain.  All other systems reviewed and are negative.     Allergies  Peanut-containing drug products; Shellfish allergy; and Codeine  Home Medications   Current Outpatient Rx  Name  Route  Sig  Dispense  Refill  . amLODipine (NORVASC) 5 MG tablet   Oral   Take 5 mg by mouth daily.         . budesonide-formoterol (SYMBICORT) 160-4.5 MCG/ACT inhaler   Inhalation   Inhale 2 puffs into the lungs 2 (two) times daily.         Marland Kitchen estradiol (ESTRACE) 1 MG tablet   Oral   Take 1 mg by mouth daily.         Marland Kitchen ibuprofen (ADVIL,MOTRIN) 200 MG tablet   Oral  Take 2 tablets (400 mg total) by mouth every 6 (six) hours as needed for moderate pain.   30 tablet   0   . valACYclovir (VALTREX) 500 MG tablet   Oral   Take 500 mg by mouth 2 (two) times daily as needed. For outbreaks           BP 133/84  Pulse 85  Temp(Src) 97.8 F (36.6 C) (Oral)  Resp 24  Ht 5\' 1"  (1.549 m)  Wt 151 lb (68.493 kg)  BMI 28.55 kg/m2  SpO2 99%   Physical Exam  Nursing note and vitals reviewed. Constitutional: She is oriented to person, place, and time. No distress.  Anxious appearing  HENT:  Head: Normocephalic and atraumatic.  Eyes: Pupils are equal, round, and reactive to light.  Cardiovascular: Normal rate, regular rhythm and normal heart sounds.   No murmur heard. Pulmonary/Chest: Effort normal and breath sounds normal. No respiratory distress.  She has no wheezes. She exhibits tenderness.  Point tenderness noted over the left anterior chest wall that reproduces the patient's pain  Abdominal: Soft. There is no tenderness.  Musculoskeletal: She exhibits no edema.  Neurological: She is alert and oriented to person, place, and time.  Skin: Skin is warm and dry.  Psychiatric: She has a normal mood and affect.     ED Course  Procedures (including critical care time)  DIAGNOSTIC STUDIES: Oxygen Saturation is 99% on room air, normal by my interpretation.    COORDINATION OF CARE: 7:44 AM-Discussed treatment plan which includes Valium, Toradol, CXR, EKG and labs with pt at bedside and pt agreed to plan.    Labs Review Labs Reviewed  D-DIMER, QUANTITATIVE  TROPONIN I    Imaging Review Dg Chest 2 View  07/02/2013   CLINICAL DATA:  Severe sharp left-sided chest pain 5 hr  EXAM: CHEST  2 VIEW  COMPARISON:  Dec 19, 2011  FINDINGS: Heart size and vascular pattern are normal. Mild subsegmental atelectasis laterally in the right lower lobe. Left lung clear except for minimal scarring or atelectasis at the lateral base. This is stable. No consolidation, infiltrate, or effusion. Bony thorax appears intact.  IMPRESSION: No significant acute abnormality. Mild discoid atelectasis right lower lobe and mild stable left lower lobe scarring.   Electronically Signed   By: Esperanza Heir M.D.   On: 07/02/2013 08:30    EKG Interpretation    Date/Time:  Thursday July 02 2013 29:56:21 EST Ventricular Rate:  92 PR Interval:  166 QRS Duration: 72 QT Interval:  368 QTC Calculation: 455 R Axis:   25 Text Interpretation:  Normal sinus rhythm Septal infarct (cited on or before 12-Feb-2013) Abnormal ECG Confirmed by Mikka Kissner  MD, Jamail Cullers ([id number]) on 07/02/2013 8:06:40 AM            MDM   1. Chest wall pain     This a 52 year old female who presents with acute onset of chest pain. She's anxious appearing but nontoxic on exam and her vital  signs are within normal limits. His story is somewhat atypical for ACS but she does have risk factors. She has reproducible chest pain on exam. EKG is poor quality but shows no evidence of acute ST elevation. Troponin, dimer, chest x-ray were ordered. Patient was given Toradol for pain.  9:57 AM Patient's pain is improved to 5/10. Continues to endorse pain with movement. Continues to have reproducible pain on exam. Patient will be given norco. Dimer and troponin are negative. Given nature pain, I suspect musculoskeletal  etiology. Patient has no evidence of pericarditis on EKG.  Patient symptoms improve. She will be discharged home with ibuprofen.  After history, exam, and medical workup I feel the patient has been appropriately medically screened and is safe for discharge home. Pertinent diagnoses were discussed with the patient. Patient was given return precautions.  I personally performed the services described in this documentation, which was scribed in my presence. The recorded information has been reviewed and is accurate.    Shon Baton, MD 07/02/13 [phone]

## 2013-07-02 NOTE — ED Notes (Signed)
Patient with c/o chest pain and shortness of breath that started at 0300 today. Sharp in nature, denies radiation. Pain worse with movement and inspiration. H/o COPD, HTN.

## 2013-08-07 IMAGING — CT CT HEAD W/O CM
4 of 6 series · 14 of 47 positions shown, 15 images · non-contrast
Comparison: None.
COMPARISON: None.

CLINICAL DATA: Fall, headache

CT HEAD WITHOUT CONTRAST,CT CERVICAL SPINE WITHOUT CONTRAST
TECHNIQUE: Contiguous axial images were obtained from the base of
the skull through the vertex without contrast.,Technique:
Multidetector CT imaging of the cervical spine was performed.
Multiplanar CT image reconstructions were also generated.
CLINICAL DATA: Fall, headache, loss of consciousness
CT CERVICAL SPINE WITHOUT CONTRAST
TECHNIQUE: Multidetector CT imaging of the cervical spine was
performed without intravenous contrast.  Multiplanar CT image
reconstructions were also generated.

[Series 2: headseq 4.8 h37s · axial · 0.39mm/px · z∈[+1218,+1388]mm · 3 of 36 slices shown, 4 images]
[im 1/36  brain]
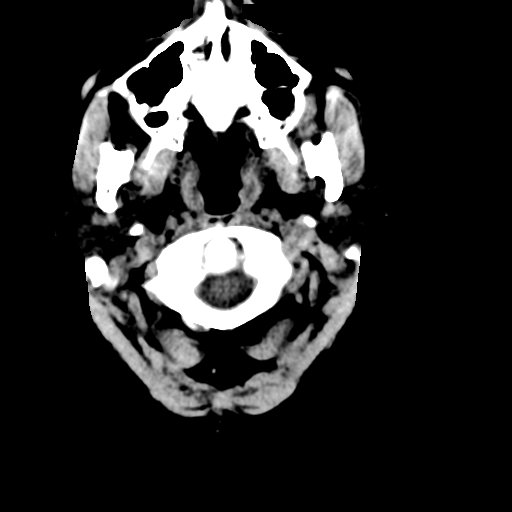
[im 1/36  bone]
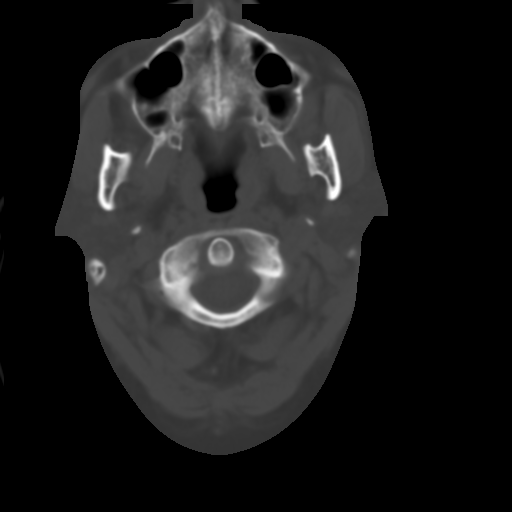
[im 18/36  brain]
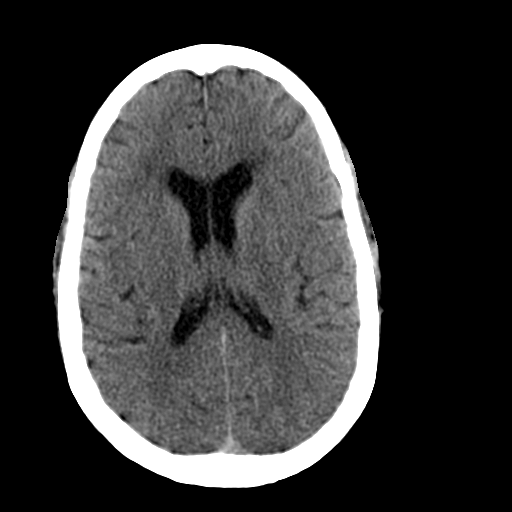
[im 36/36  brain]
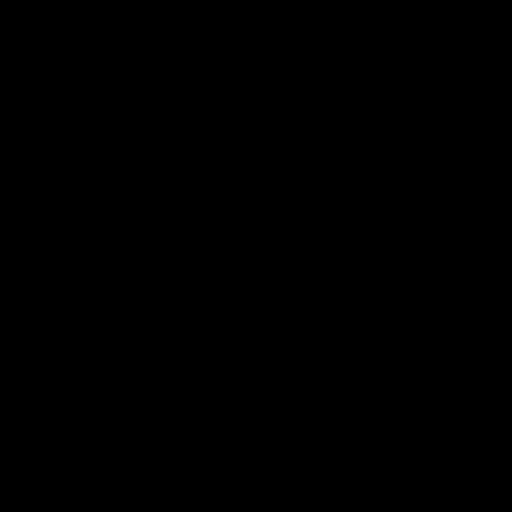

[Series 8: coronal bone 2.0 · coronal · 0.25mm/px · 3 of 49 slices shown]
[im 17/49  brain]
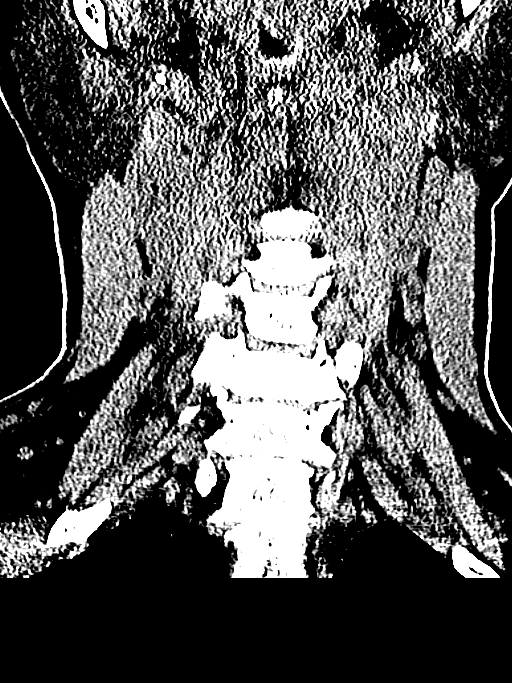
[im 22/49  brain]
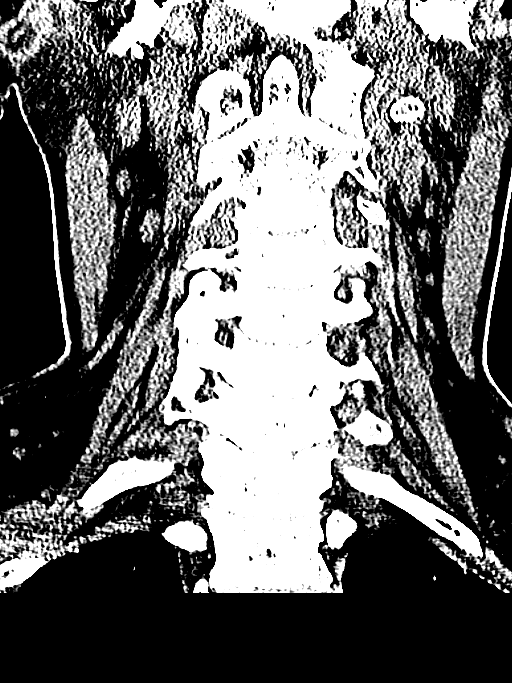
[im 27/49  brain]
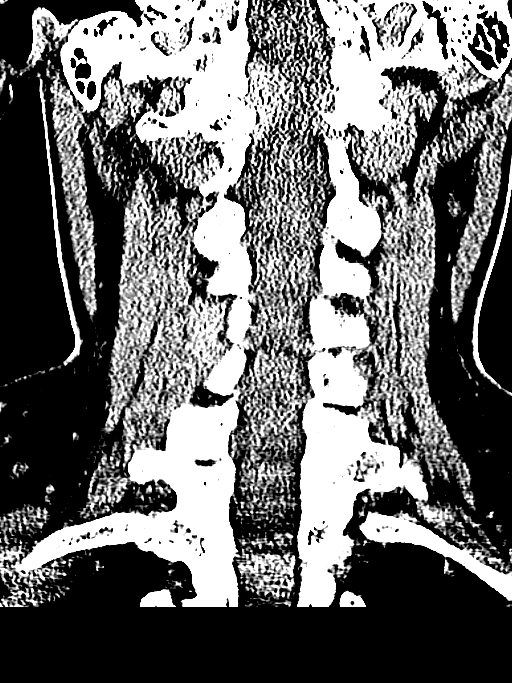

[Series 9: axial bone 2.0 · axial · 0.16mm/px · z∈[+1066,+1141]mm · 5 of 89 slices shown]
[im 10/89  bone]
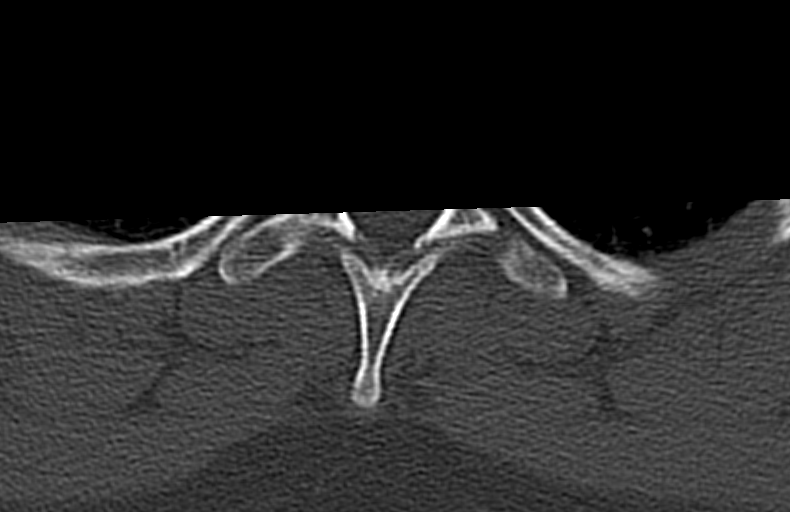
[im 20/89  bone]
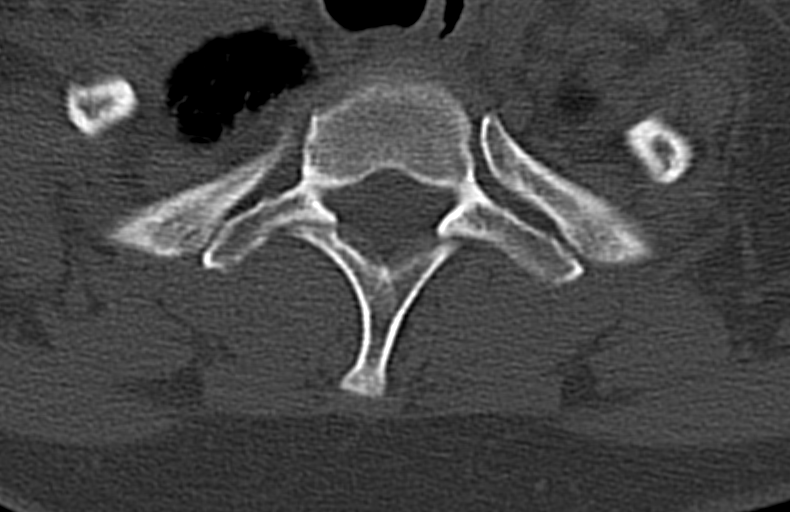
[im 30/89  bone]
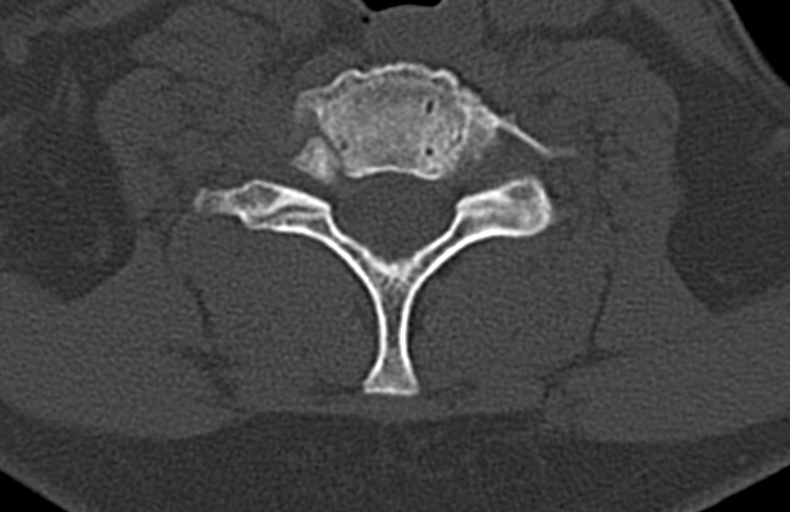
[im 40/89  bone]
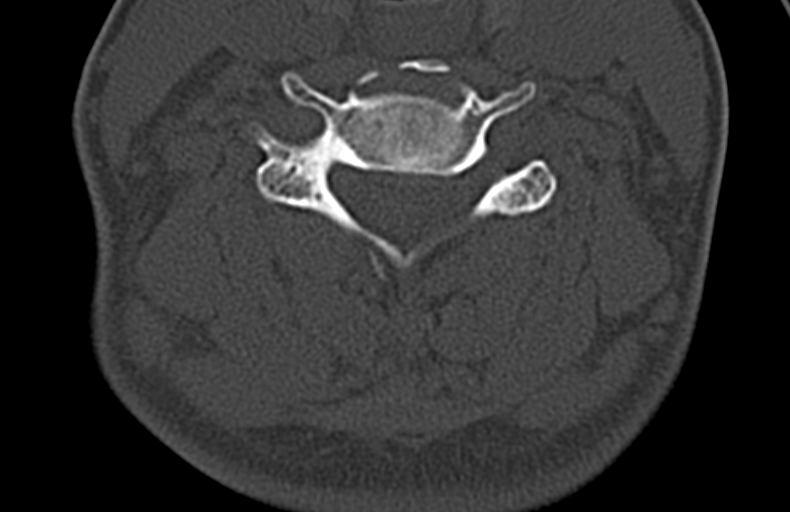
[im 49/89  bone]
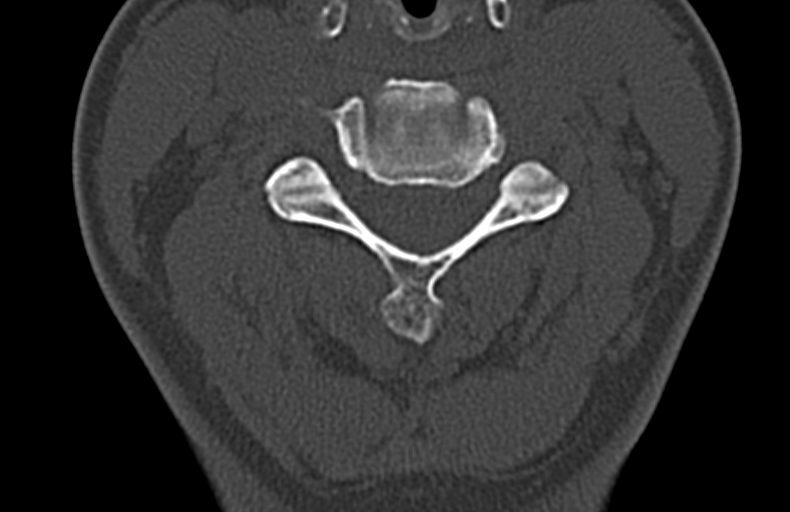

[Series 11: sagittal bone 2.0 · sagittal · 0.22mm/px · 3 of 58 slices shown]
[im 20/58  brain]
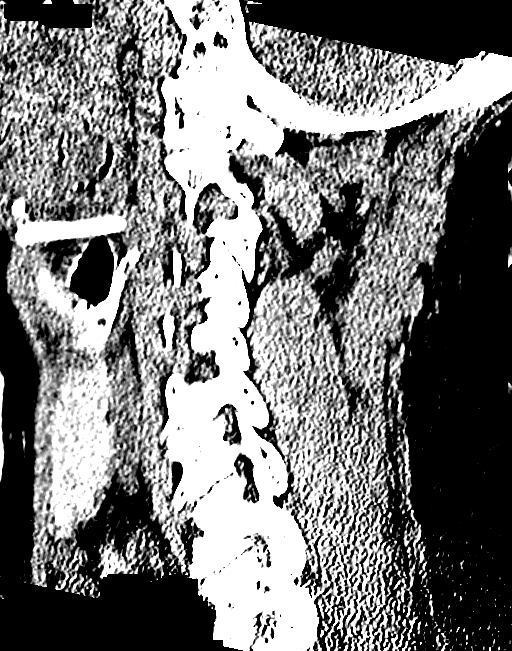
[im 29/58  brain]
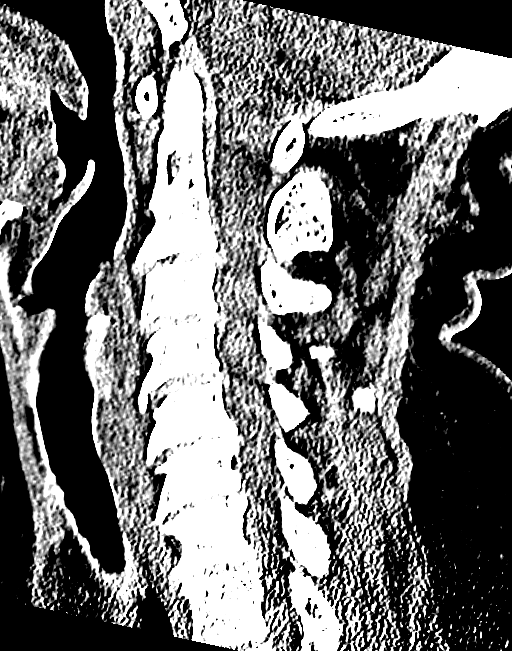
[im 39/58  brain]
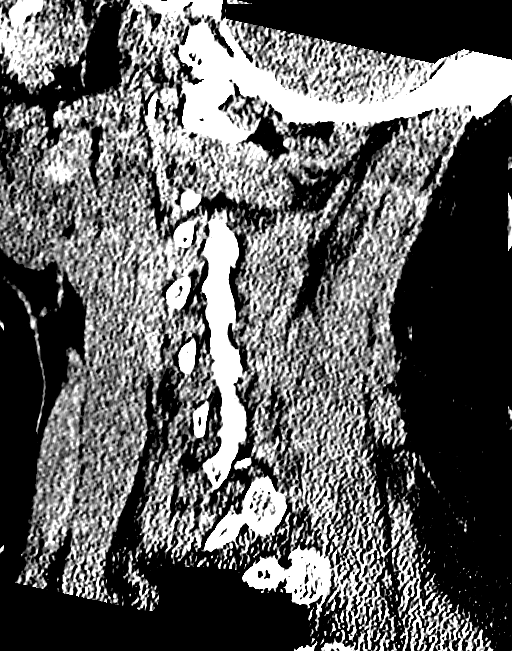

[14 of 47 positions shown; findings below may reference images not displayed]

FINDINGS: Stable left basal ganglia perivascular cyst, image 15.
No acute intracranial hemorrhage, mass lesion, midline shift,
infarction, hydrocephalus, or extra-axial fluid collection.  Gray-
white matter differentiation is maintained.  Cisterns patent.  No
cerebellar abnormality.  Orbits are symmetric.  Mastoids clear.
Minimal left sphenoid mucosal thickening otherwise sinuses are
clear.
IMPRESSION: Stable head CT.  No acute intracranial finding.
FINDINGS: Congenital C2/3 fusion noted.  Normal alignment.  Mild
to moderate cervical degenerative changes spondylosis at C5-6, C6-
7, and C7-T1.  Facets aligned.  No fracture, compression deformity,
or focal kyphosis.  Normal prevertebral soft tissues.  No soft
tissue asymmetry in the neck.  Lung apices are clear.
IMPRESSION: No acute fracture or abnormality by CT.

Congenital C2-3 fusion

Lower cervical degenerative changes and spondylosis.

## 2013-10-27 IMAGING — MG MM DIGITAL SCREENING BILAT W/ CAD
4 series · 4 of 4 positions shown · non-contrast
Comparison: none

DG SCREEN MAMMOGRAM BILATERAL
Bilateral CC and MLO view(s) were taken.

DIGITAL SCREENING MAMMOGRAM WITH CAD:
There are scattered fibroglandular densities.  No masses or malignant type calcifications are 
identified.  Compared with prior studies.
Images were processed with CAD.

[L CC]
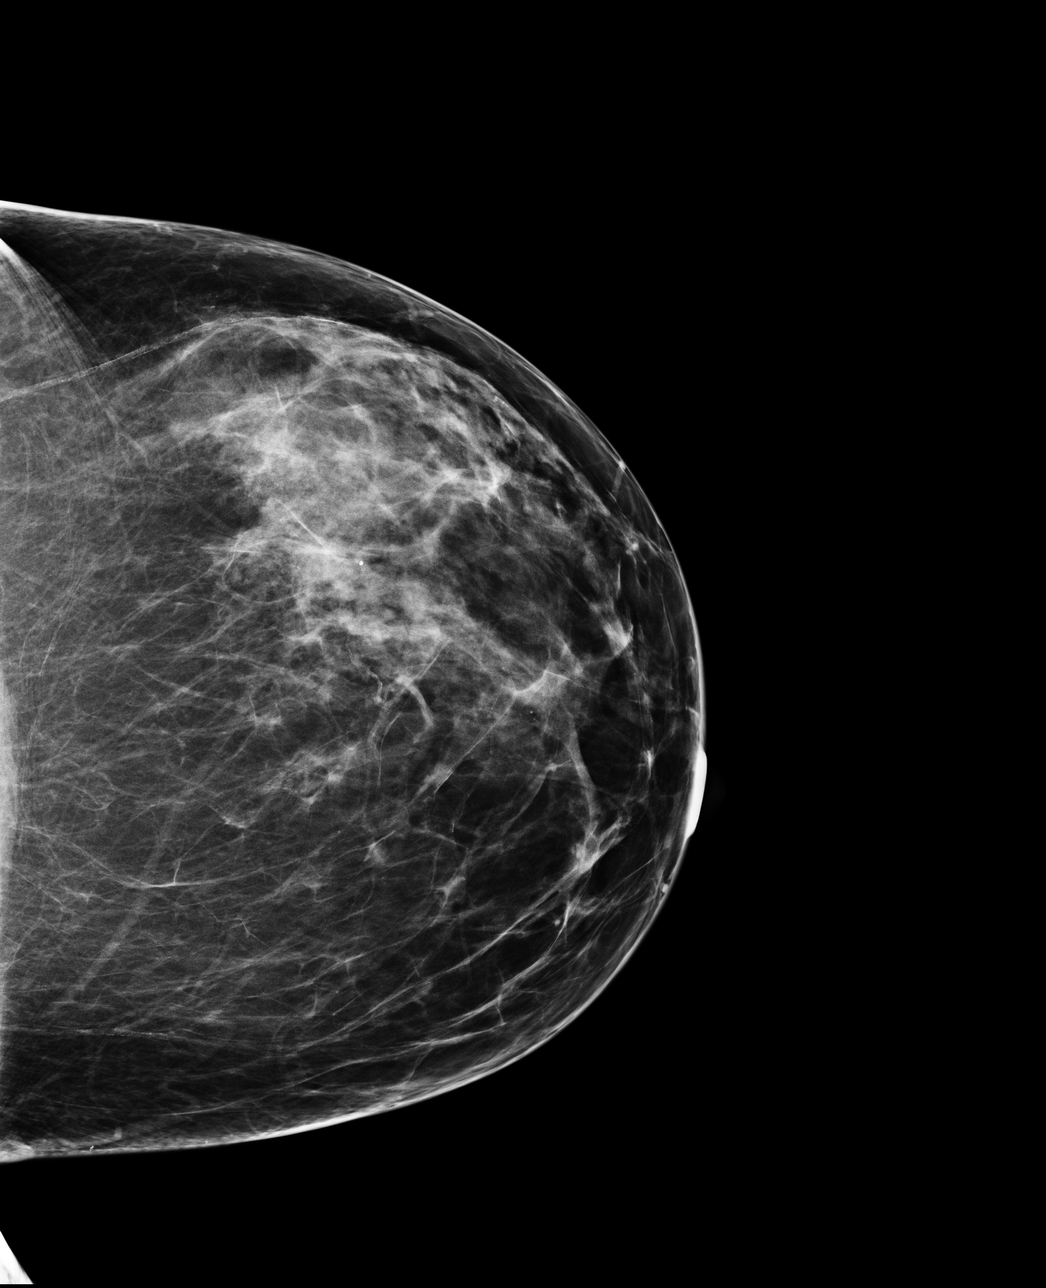

[L MLO]
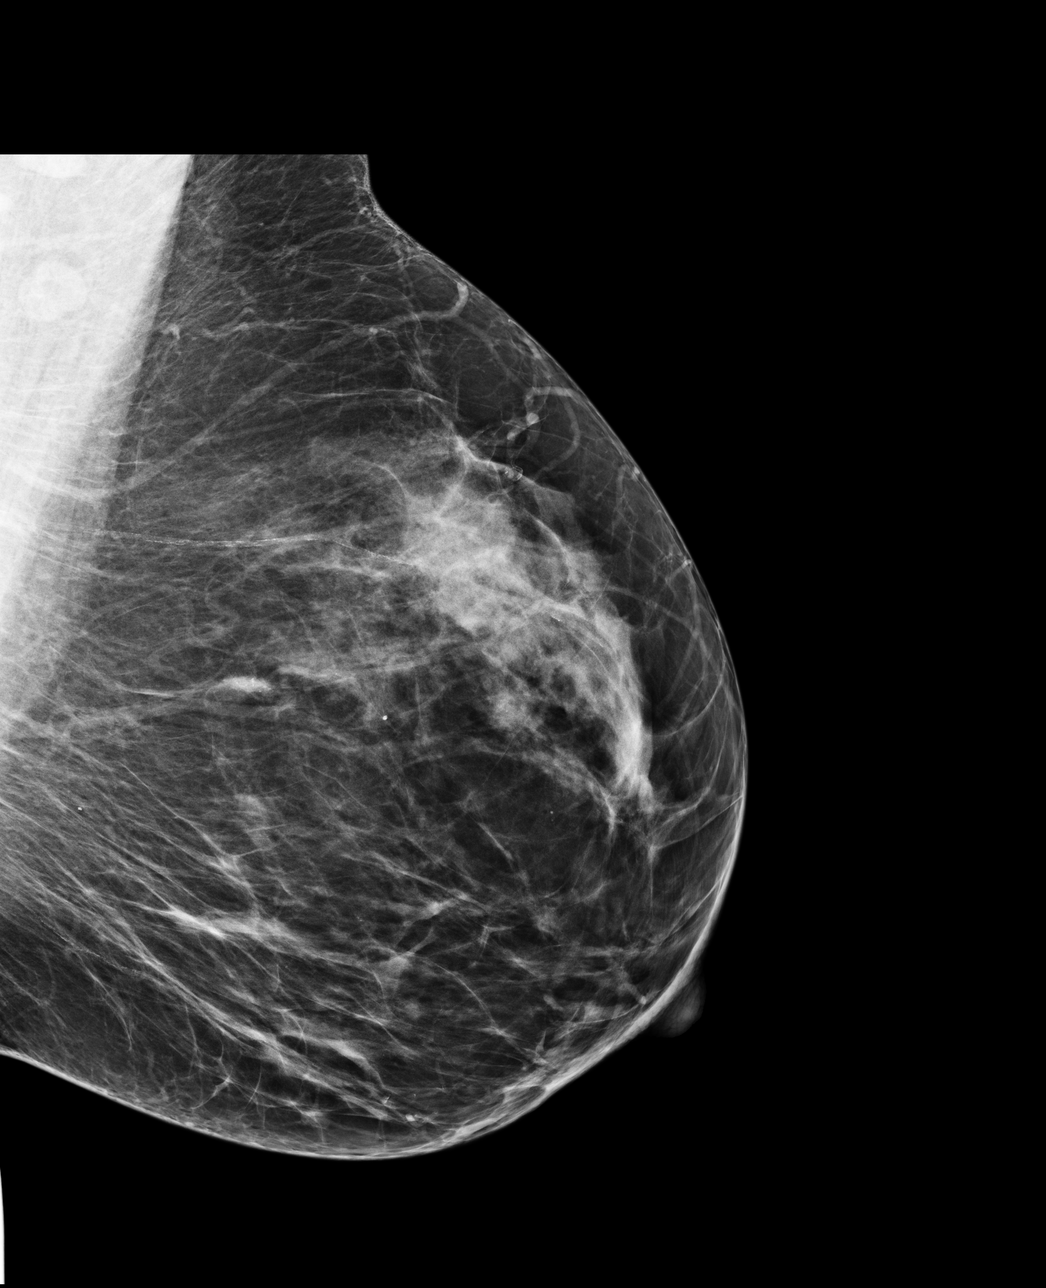

[R CC]
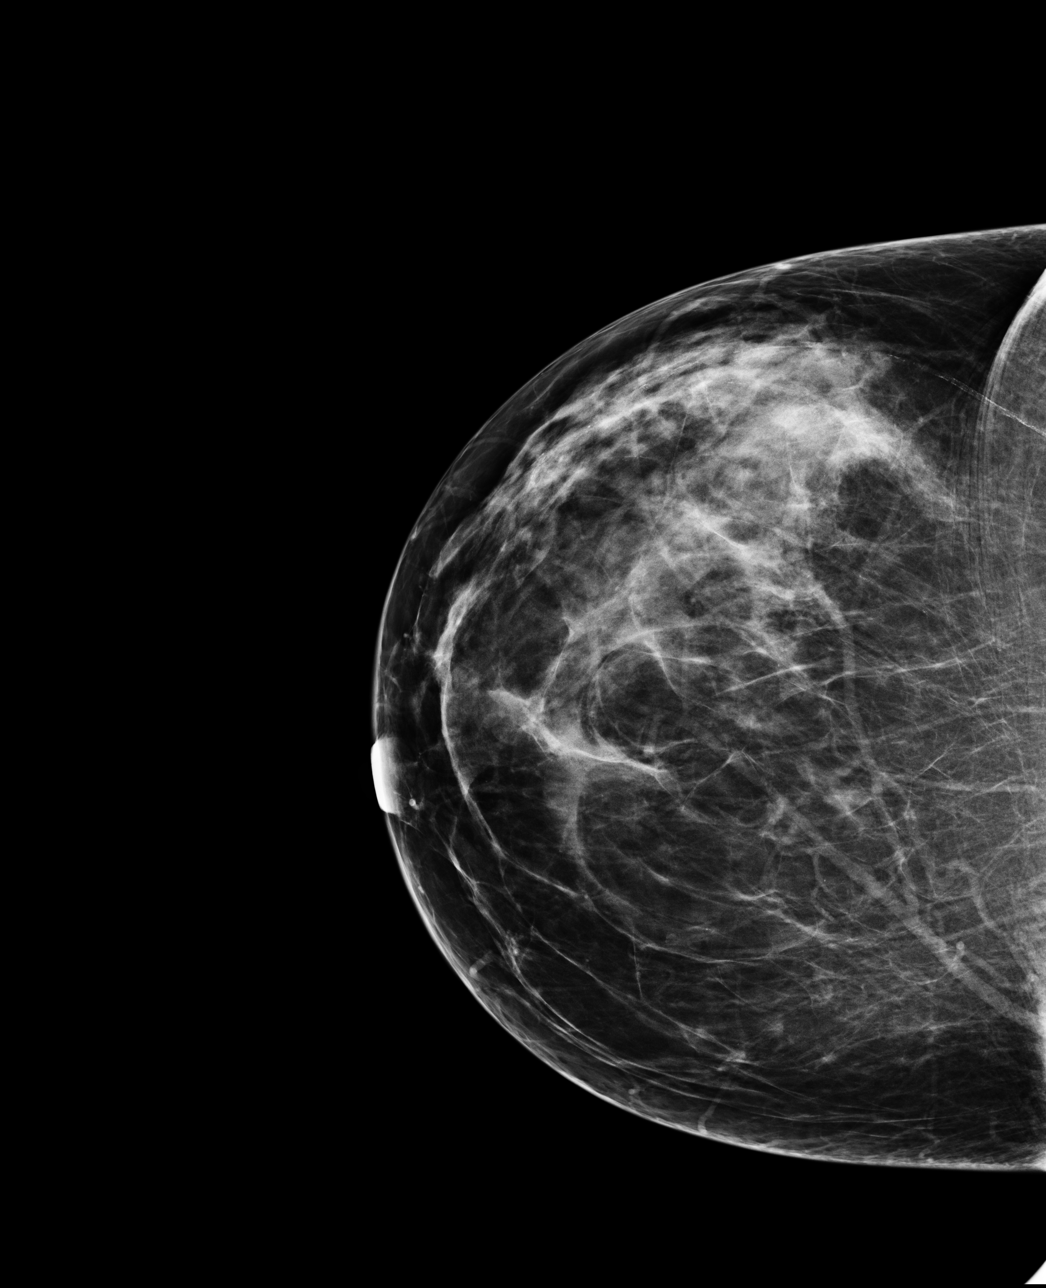

[R MLO]
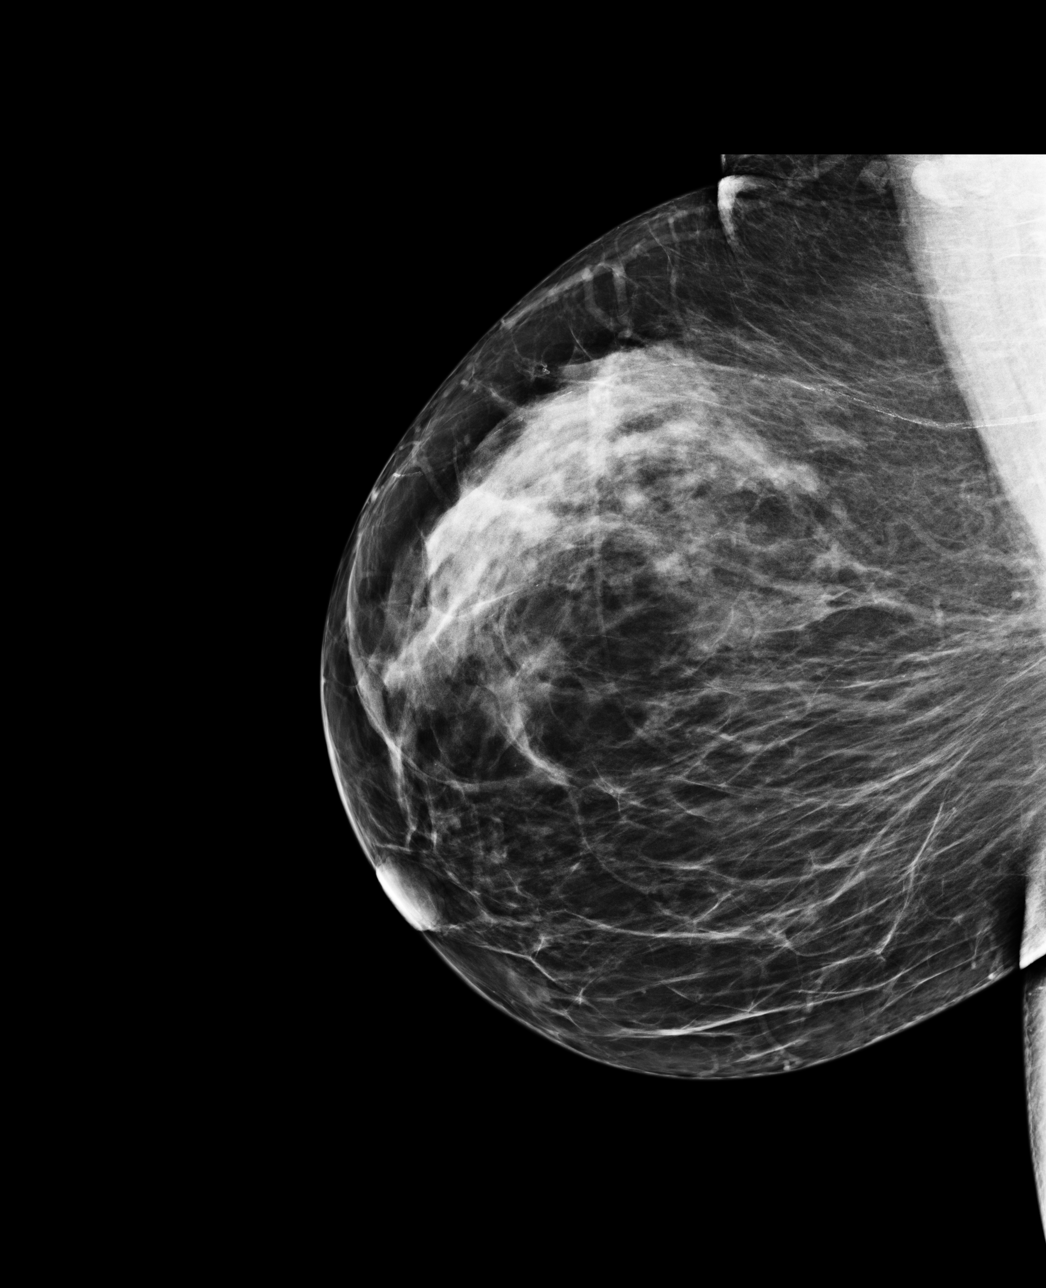

[4 of 4 positions shown; findings below may reference images not displayed]

IMPRESSION: No specific mammographic evidence of malignancy.  Next screening mammogram is recommended in one 
year.

A result letter of this screening mammogram will be mailed directly to the patient.

ASSESSMENT: Negative - BI-RADS 1

Screening mammogram in 1 year.
,

## 2013-10-29 ENCOUNTER — Ambulatory Visit (INDEPENDENT_AMBULATORY_CARE_PROVIDER_SITE_OTHER): Payer: 59

## 2013-10-29 ENCOUNTER — Encounter: Payer: Self-pay | Admitting: Orthopedic Surgery

## 2013-10-29 ENCOUNTER — Ambulatory Visit (INDEPENDENT_AMBULATORY_CARE_PROVIDER_SITE_OTHER): Payer: 59 | Admitting: Orthopedic Surgery

## 2013-10-29 VITALS — BP 132/91 | Ht 61.0 in | Wt 156.0 lb

## 2013-10-29 DIAGNOSIS — M47812 Spondylosis without myelopathy or radiculopathy, cervical region: Secondary | ICD-10-CM

## 2013-10-29 DIAGNOSIS — M542 Cervicalgia: Secondary | ICD-10-CM

## 2013-10-29 MED ORDER — GABAPENTIN 100 MG PO CAPS: 100.0000 mg | ORAL_CAPSULE | Freq: Three times a day (TID) | ORAL | Status: DC

## 2013-10-29 MED ORDER — METHOCARBAMOL 500 MG PO TABS: 500.0000 mg | ORAL_TABLET | Freq: Three times a day (TID) | ORAL | Status: DC

## 2013-10-29 NOTE — Progress Notes (Signed)
Subjective:     Patient ID: Kimberly [name], female   DOB: Mar 18, 1961, 53 y.o.   MRN: [id number]  HPI Comments: This patient is 53 years old she complains of atraumatic onset of burning pain down her right arm she has a history of rotator cuff syndrome that is still bothering her but these symptoms of radicular pain have been present since July of 2014 and have progressively worsened and are not improved with hydrocodone 5 mg ibuprofen 800 mg she presents for evaluation  Neck Pain  This is a new problem. The current episode started more than 1 month ago. The problem has been gradually worsening. The pain is associated with a sleep position. The pain is present in the right side. The quality of the pain is described as burning (throbbing sharp). The pain is at a severity of 8/10. The pain is worse during the night.     Review of Systems  Constitutional: Positive for fatigue.  Respiratory: Positive for shortness of breath.   Musculoskeletal: Positive for neck pain.       Objective:   Physical Exam BP 132/91  Ht 5\' 1"  (1.549 m)  Wt 156 lb (70.761 kg)  BMI 29.49 kg/m2 General appearance is normal, the patient is alert and oriented x3 with normal mood and affect. Ambulation remains normal  Evaluation of the cervical spine reveals that she has tenderness at C6-C7 and in the right trapezius muscle severe left trapezius muscle mild chest decreased range of motion negative Spurling sign negative L'Hermittes  She has weakness in the biceps tendon on the right otherwise both upper extremities have normal strength bulk shoulders are stable skin is intact in the cervical thoracic and lumbar spine right shoulder and left shoulder right and left arm. Distal pulses are intact there are no sensory abnormalities she has normal lymph nodes in both cervical and axillary regions bilaterally. Tendon reflexes are 2+ and equal elbow and wrist    Assessment:     X-rays show cervical spondylosis with  degenerative changes at C5 and 6, C6 and 7, C4 and 5    Plan:     Encounter Diagnoses  Name Primary?  . Neck pain on right side Yes  . Cervical spondylosis without myelopathy     Meds ordered this encounter  Medications  . gabapentin (NEURONTIN) 100 MG capsule    Sig: Take 1 capsule (100 mg total) by mouth 3 (three) times daily.    Dispense:  90 capsule    Refill:  2  . methocarbamol (ROBAXIN) 500 MG tablet    Sig: Take 1 tablet (500 mg total) by mouth 3 (three) times daily.    Dispense:  60 tablet    Refill:  1  . HYDROcodone-acetaminophen (NORCO/VICODIN) 5-325 MG per tablet    Sig: Take 1 tablet by mouth every 6 (six) hours as needed for moderate pain.   Physical therapy as ordered followup in 6 weeks

## 2013-10-29 NOTE — Patient Instructions (Addendum)
Occupational therapy for cervical spondylosis  Recommend steroid Dosepak for 2 weeks followed by anti-inflammatories  You also been prescribed a muscle relaxer. And also gabapentin to help with nerve pain.  Stop Ibuprofen

## 2013-11-12 ENCOUNTER — Ambulatory Visit (HOSPITAL_COMMUNITY)
Admission: RE | Admit: 2013-11-12 | Discharge: 2013-11-12 | Disposition: A | Discharge status: Home / Self Care | Payer: 59 | Source: Ambulatory Visit | Attending: Orthopedic Surgery | Admitting: Orthopedic Surgery

## 2013-11-12 DIAGNOSIS — M629 Disorder of muscle, unspecified: Secondary | ICD-10-CM

## 2013-11-12 DIAGNOSIS — M6289 Other specified disorders of muscle: Secondary | ICD-10-CM | POA: Insufficient documentation

## 2013-11-12 DIAGNOSIS — M25619 Stiffness of unspecified shoulder, not elsewhere classified: Secondary | ICD-10-CM | POA: Insufficient documentation

## 2013-11-12 DIAGNOSIS — J4489 Other specified chronic obstructive pulmonary disease: Secondary | ICD-10-CM | POA: Insufficient documentation

## 2013-11-12 DIAGNOSIS — J449 Chronic obstructive pulmonary disease, unspecified: Secondary | ICD-10-CM | POA: Insufficient documentation

## 2013-11-12 DIAGNOSIS — M25519 Pain in unspecified shoulder: Secondary | ICD-10-CM | POA: Insufficient documentation

## 2013-11-12 DIAGNOSIS — M6281 Muscle weakness (generalized): Secondary | ICD-10-CM | POA: Insufficient documentation

## 2013-11-12 DIAGNOSIS — IMO0001 Reserved for inherently not codable concepts without codable children: Secondary | ICD-10-CM | POA: Insufficient documentation

## 2013-11-12 DIAGNOSIS — I10 Essential (primary) hypertension: Secondary | ICD-10-CM | POA: Insufficient documentation

## 2013-11-12 NOTE — Evaluation (Addendum)
Occupational Therapy Evaluation  Patient Details  Name: Kimberly [name] MRN: [id number] Date of Birth: 09/23/1960  Today's Date: 11/12/2013 Time: 2423-5361 OT Time Calculation (min): 55 min Eval 1428-1500 (32') Manual : 4431-5400 (9') ADL/HEP: 8676-1950 (10')  Visit#: 1 of 16  Re-eval: 12/10/13  Assessment Diagnosis: Right Neck Pain Surgical Date:  (n/a) Next MD Visit: 3 months Prior Therapy: For Left shoulder  Authorization: [employer]  Authorization Time Period:    Authorization Visit#: 1 of 16   Past Medical History:  Past Medical History  Diagnosis Date  . Hypertension   . COPD (chronic obstructive pulmonary disease)   . Herpes    Past Surgical History:  Past Surgical History  Procedure Laterality Date  . Abdominal hysterectomy    . Rotator cuff repair Left   . Foreign body removal Left     knee-as child  . Carpal tunnel release Left 02/16/2013    Procedure: CARPAL TUNNEL RELEASE;  Surgeon: Carole Civil, MD;  Location: [facility];  Service: Orthopedics;  Laterality: Left;  . Carpal tunnel release Right 03/27/2013    Procedure: RIGHT CARPAL TUNNEL RELEASE;  Surgeon: Carole Civil, MD;  Location: [facility];  Service: Orthopedics;  Laterality: Right;    Subjective Symptoms/Limitations Symptoms: "It just hurts all the way down... tbrobbing, aching, all the time." Pertinent History: Pt is 55 eyar old who has previoulsy had left rotator cuff surgery, and right and left carpal tunnel releases.  Her right carpal tunnel release occurred in August 2014, after which pt begain having increase pain her right neck to her right fingertips.  The pain has only gotten worse over time.  Medications take the edge off the pain, but do not tak it away.  Pt recenlty had x-rays with Dr. Aline Brochure, who verbalized arthritis in her right side. Limitations: Lifting, reaching, picking up objects Special Tests: FOTO42/100 (58% limited) Patient Stated Goals: "reduce the pain" Pain  Assessment Currently in Pain?: Yes Pain Score: 7  Pain Location: Shoulder (neck to hand on right side) Pain Orientation: Right Pain Type: Acute pain Multiple Pain Sites: Yes  Precautions/Restrictions  Precautions Precautions: None Restrictions Weight Bearing Restrictions: No  Balance Screening Balance Screen Has the patient fallen in the past 6 months: No  Prior [place] expects to be discharged to:: Private residence Living Arrangements: Spouse/significant other (Pt's husband is on disability, goes to dialysis, and is on O) Prior Function Level of Independence: Independent with basic ADLs;Independent with homemaking with ambulation Driving: Yes Vocation: Unemployed (Pt was let go on January 20th 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 Leisure: Hobbies-yes (Comment) Comments: Walks in the park, going to the beach, reading, TV  Assessment ADL/Vision/Perception ADL ADL Comments: Pt is completing all ADLs, but it having increased pain. Pt cannot currenlty lift a gallon of milk. Housework is diffiuclt, especially vaccumming. Lifting her pocketbook or her husban'd oxygen tanks is very painful. Dominant Hand: Right Vision - History Baseline Vision:  (Pt reports needing to get glasses)  Cognition/Observation Cognition Overall Cognitive Status: Within Functional Limits for tasks assessed Arousal/Alertness: Awake/alert Orientation Level: Oriented X4  Sensation/Coordination/Edema Sensation Light Touch: Appears Intact Additional Comments: Pt has numbness in area of right thenar prominence, and hypersensitivity.  Coordination Gross Motor Movements are Fluid and Coordinated: Yes Fine Motor Movements are Fluid and Coordinated: Yes  Additional Assessments RUE AROM (degrees) RUE Overall AROM Comments: Assess  in sitting Right Shoulder Flexion: 149 Degrees Right Shoulder ABduction: 125 Degrees Right Shoulder Internal Rotation: 78 Degrees Right Shoulder External Rotation: 62 Degrees Right Elbow Flexion: 145 Right Elbow Extension:  (WFL) Right Forearm Pronation:  (WFL) Right Forearm Supination:  (WFL) RUE PROM (degrees) RUE Overall PROM Comments: Assess in supine, with ER/IR adducted Right Shoulder Flexion: 158 Degrees Right Shoulder ABduction: 131 Degrees Right Shoulder Internal Rotation: 90 Degrees Right Shoulder External Rotation: 34 Degrees RUE Strength RUE Overall Strength Comments: Assess in sitting Right Shoulder Flexion: 3-/5 Right Shoulder ABduction: 3-/5 Right Shoulder Internal Rotation: 4/5 Right Shoulder External Rotation: 3+/5 Grip (lbs): 21 Lateral Pinch: 14 lbs 3 Point Pinch: 8 lbs LUE Strength Grip (lbs): 48 Lateral Pinch: 13 lbs 3 Point Pinch: 11 lbs Cervical AROM Overall Cervical AROM Comments: Assessed in sitting in cm Cervical Flexion: 3.5 Cervical Extension: 16 Cervical - Right Side Bend: 16 Cervical - Left Side Bend: 17 Cervical - Right Rotation: 16 Cervical - Left Rotation: 13 Palpation Palpation: Pt with max fascial restrictions in upper arm, bicep, upper trap, and scapular regions. Right Hand Strength - Pinch (lbs) Lateral Pinch: 14 lbs 3 Point Pinch: 8 lbs Left Hand Strength - Pinch (lbs) Lateral Pinch: 13 lbs 3 Point Pinch: 11 lbs     Manual Therapy Manual Therapy: Myofascial release Myofascial Release: MFR to right bicep, upper arm, cervical nexk, upper trap, and scapular regions to decrease pain and tightness.  Occupational Therapy Assessment and Plan OT Assessment and Plan Clinical Impression Statement: Pt was received for OT eval this session for increased right neck and shoulder pain.  Pt is currenlty presenting with increased pain, decreased ROM, increased fascial restrictions, decreased strength, and decreased ability to complete IADL tasks  as a result. Pt will benefit from skilled therapeutic intervention in order to improve on the following deficits: Decreased strength;Impaired UE functional use;Decreased range of motion;Pain;Impaired sensation;Increased fascial restricitons Rehab Potential: Good OT Frequency: Min 2X/week OT Duration: 8 weeks OT Treatment/Interventions: Self-care/ADL training;Therapeutic exercise;Modalities;Manual therapy;Therapeutic activities;Patient/family education OT Plan: Pt will benefit from skilled OT services to decreased pain, increase ROM, improve strength, improve IADL status, and decrease fascial restrictions.    Treatment Plan: MFR and manual stretching, PROM, AAROM, AROM, e-stim PRN, proximal shoulder strengthening, cervical neck stretching, scapular strengthening   Goals Home Exercise Program Pt/caregiver will Perform Home Exercise Program: For increased ROM;For increased strengthening PT Goal: Perform Home Exercise Program - Progress: Goal set today Short Term Goals Time to Complete Short Term Goals: 4 weeks Short Term Goal 1: Pt will be educated on HEP. Short Term Goal 2: Pt will obtain PROM WFL in right shoulder for imporved ability to carry her pocketbook. Short Term Goal 3: Pt will have right neck and shoulder pain of less than 4/10. Short Term Goal 4: Pt will have right shoulder strength of atleast 3+/5 for imporved ability to do housework. Short Term Goal 5: Pt will have mod fascial restrictions in her neck and shoulder regions. Additional Short Term Goals?: Yes Short Term Goal 6: Pt's grip strenght will improve by atleast 10 lbs. Long Term Goals Long Term Goal 1: Pt will attain highest level of functioning in all ADL, IADL, and leisure tasks. Long Term Goal 2: Pt will have right shoulder AROM WNL in order to vacuum her house. Long Term Goal 3: Pt will have pain of less than 2/10 picking up her husband's oxygen tanks. Long Term Goal 4: Pt will had right shoulder strength of atleast 4/5  in order to  vaccuum the house. Long Term Goal 5: Pt will have less than min fascial restrictions in her RUE. Additional Long Term Goals?: Yes Long Term Goal 6: Pt's grip strength will improve by atleast 20 lbs  Problem List Patient Active Problem List   Diagnosis Date Noted  . Muscle weakness (generalized) 11/12/2013  . Tight fascia 11/12/2013  . Decreased range of motion of shoulder 11/12/2013  . Cervical spondylosis without myelopathy 10/29/2013  . S/P carpal tunnel release 02/19/2013  . CTS (carpal tunnel syndrome) 02/19/2013  . SHOULDER PAIN 10/06/2007  . IMPINGEMENT SYNDROME 10/06/2007  . RUPTURE ROTATOR CUFF 10/06/2007  . HIGH BLOOD PRESSURE 10/03/2007    End of Session Activity Tolerance: Patient tolerated treatment well General Behavior During Therapy: [facility] for tasks assessed/performed OT Plan of Care OT Home Exercise Plan: Towel slides and cervical neck stretches OT Patient Instructions: handouts provided (scanned) and pt verbalized understanding Consulted and Agree with Plan of Care: Patient  GO Functional Assessment Tool Used: FOTO 42/100  Bea Graff, [place], OTR/L [phone]  11/12/2013, 5:19 PM   Physician Documentation Your signature is required to indicate approval of the treatment plan as stated above.  Please sign and either send electronically or make a copy of this report for your files and return this physician signed original.  Please mark one 1.__approve of plan  2. ___approve of plan with the following conditions.   ______________________________                                                          _____________________ Physician Signature                                                                                                             Date

## 2013-11-17 IMAGING — CR DG CHEST 2V
2 series · 2 of 2 positions shown · non-contrast
Comparison: 10/19/2009

CLINICAL DATA: Cough and shortness of breath

CHEST - 2 VIEW

[view not recorded (1 of 2)]
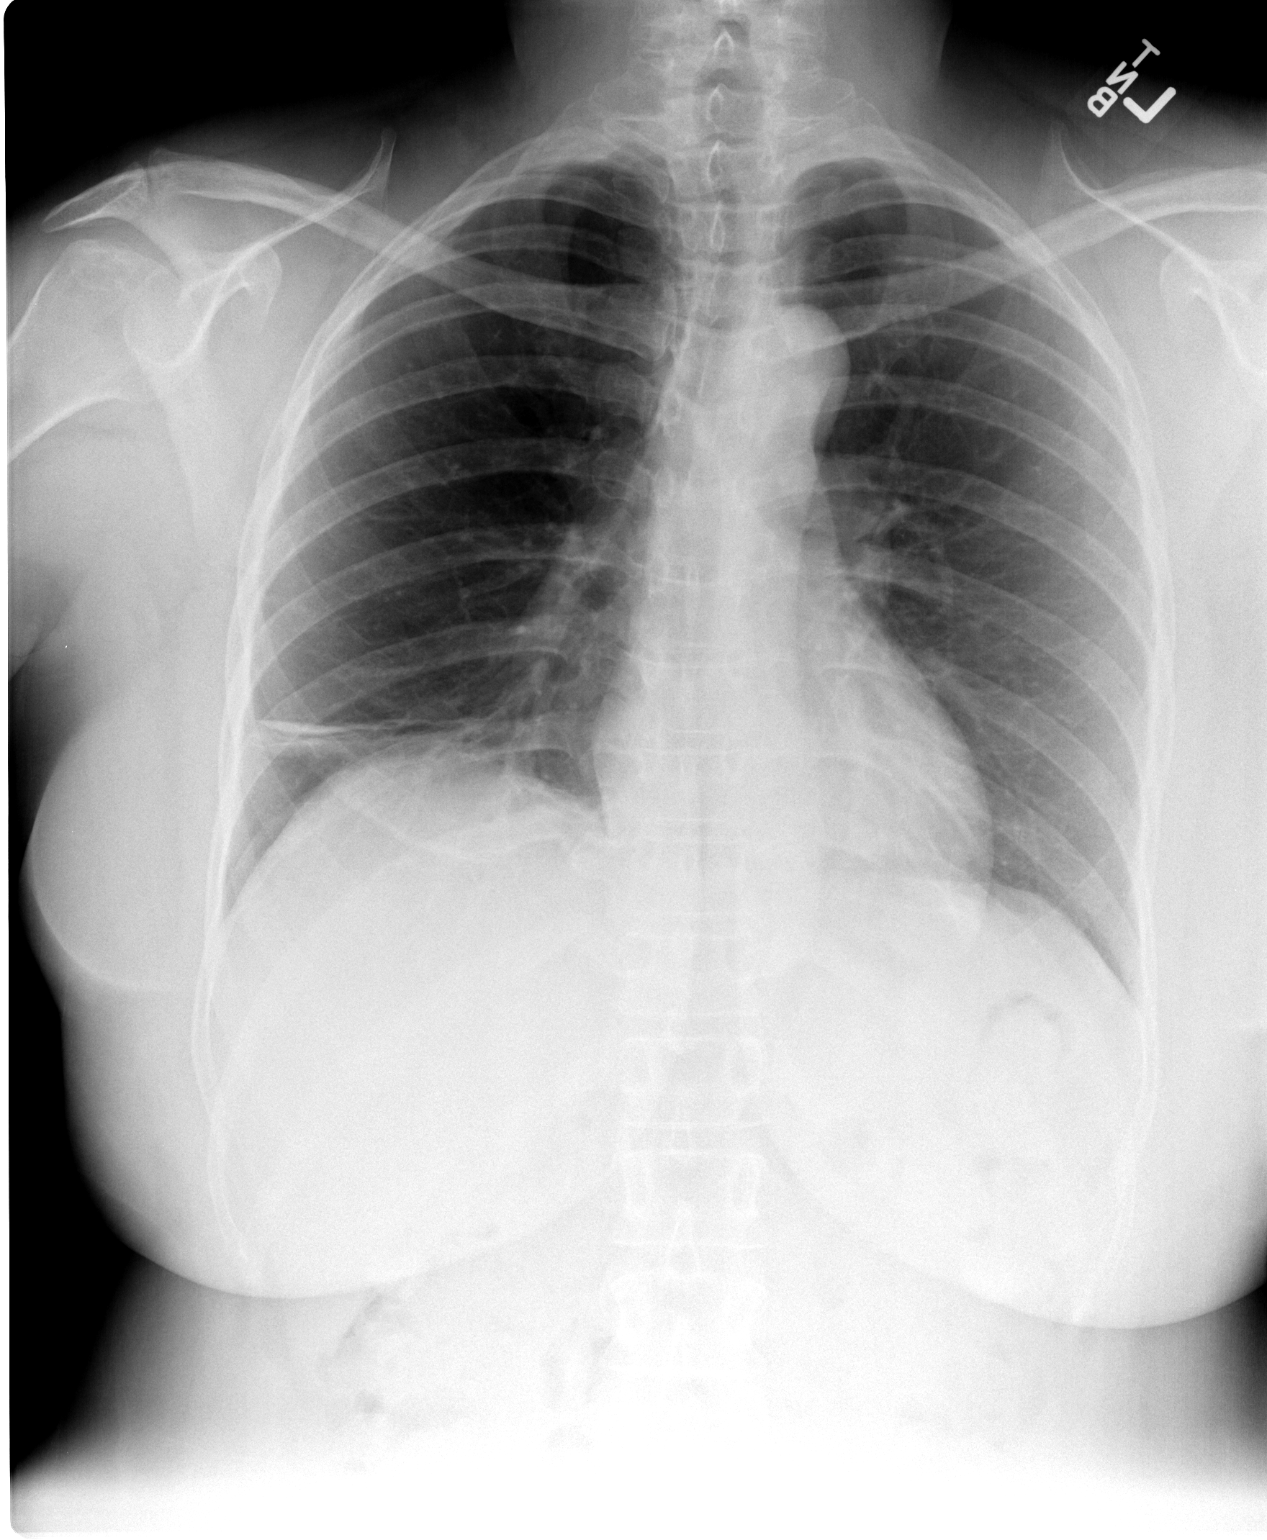

[view not recorded (2 of 2)]
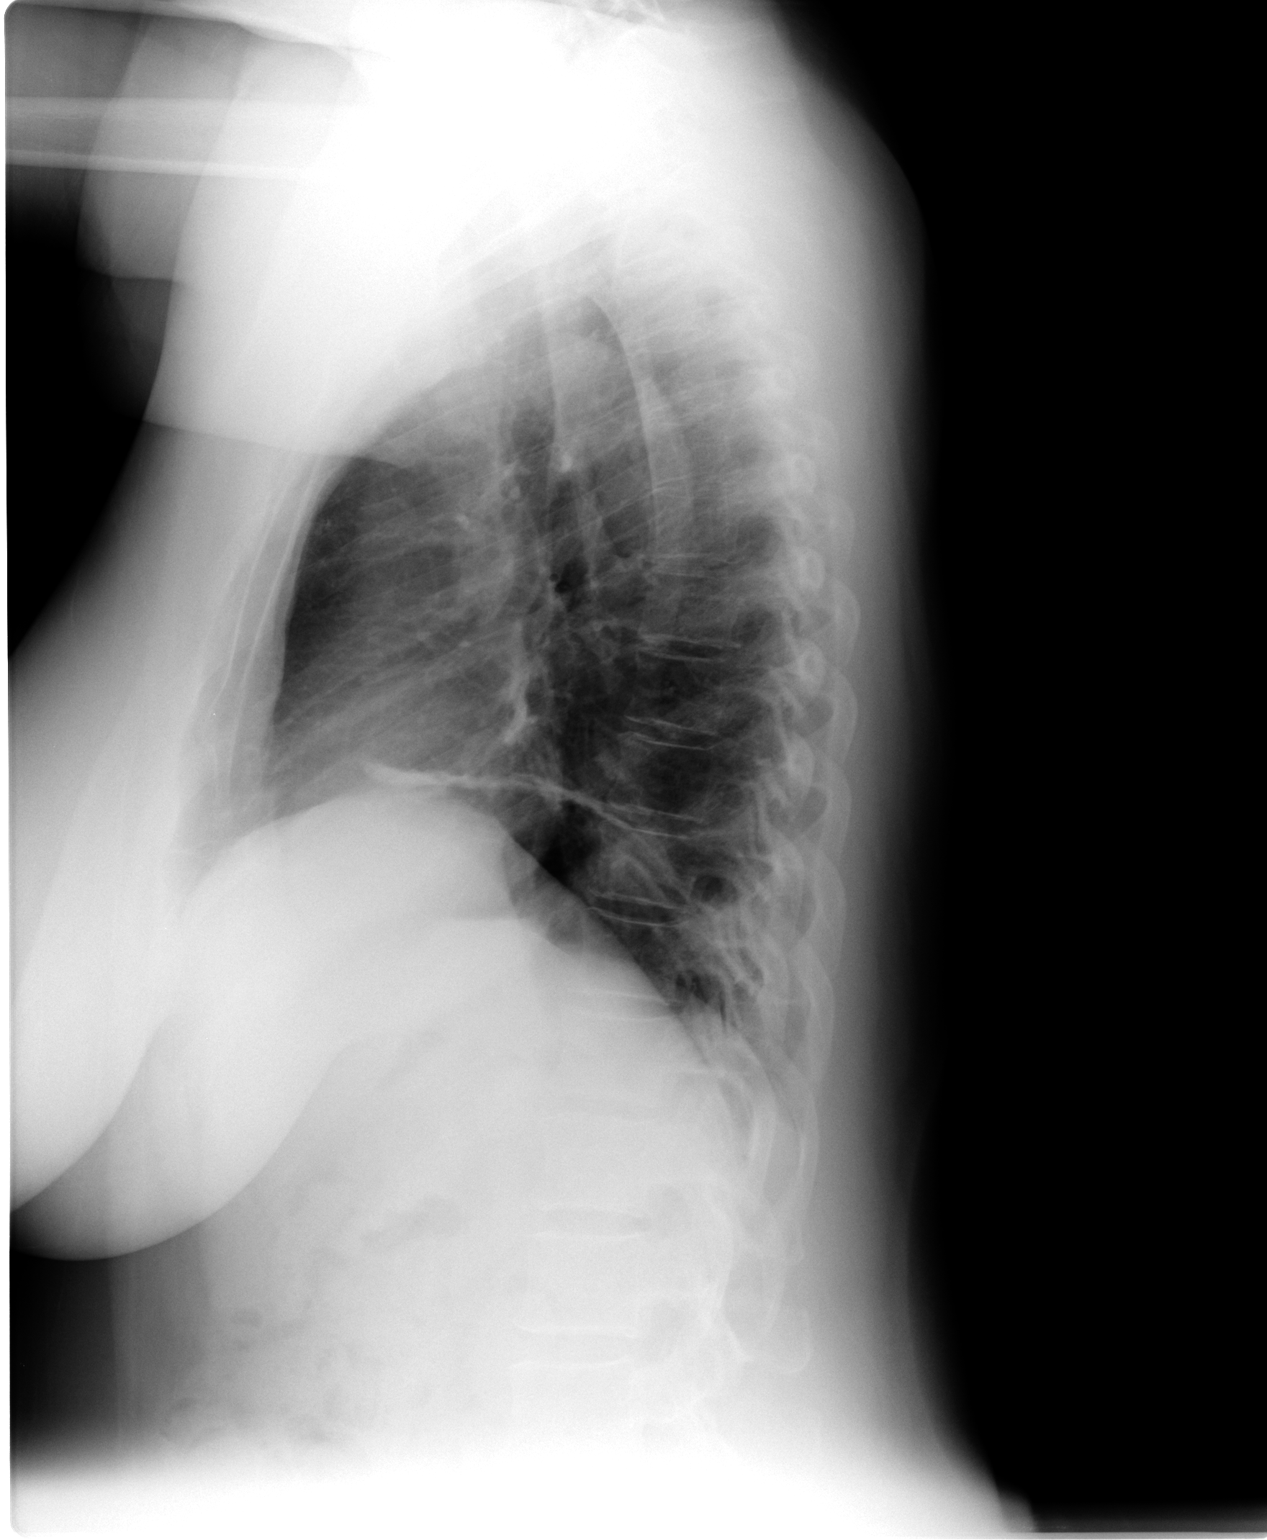

[2 of 2 positions shown; findings below may reference images not displayed]

FINDINGS: Heart size is normal.

There is no pleural effusion or pulmonary edema identified.

Plate-like atelectasis linear scarring is noted within the right
lung base.

No airspace consolidation.

Review of the visualized osseous structures is unremarkable.
IMPRESSION: 1.  Right base plate-like atelectasis versus scarring.

## 2013-11-25 ENCOUNTER — Ambulatory Visit (HOSPITAL_COMMUNITY)
Admission: RE | Admit: 2013-11-25 | Discharge: 2013-11-25 | Disposition: A | Discharge status: Home / Self Care | Payer: 59 | Source: Ambulatory Visit | Attending: Internal Medicine | Admitting: Internal Medicine

## 2013-11-25 DIAGNOSIS — M629 Disorder of muscle, unspecified: Secondary | ICD-10-CM

## 2013-11-25 DIAGNOSIS — M6289 Other specified disorders of muscle: Secondary | ICD-10-CM

## 2013-11-25 DIAGNOSIS — M25619 Stiffness of unspecified shoulder, not elsewhere classified: Secondary | ICD-10-CM

## 2013-11-25 DIAGNOSIS — M6281 Muscle weakness (generalized): Secondary | ICD-10-CM

## 2013-11-25 NOTE — Progress Notes (Signed)
Occupational Therapy Treatment Patient Details  Name: Kimberly [name] MRN: [id number] Date of Birth: Jan 05, 1961  Today's Date: 11/25/2013 Time: 5427-0623 OT Time Calculation (min): 45 min MFR 7628-3151 22' Therex 7616-0737 13' Heat 1343-1353 10'  Visit#: 2 of 16  Re-eval: 12/10/13    Authorization:    Authorization Time Period:    Authorization Visit#:   of    Subjective Symptoms/Limitations Symptoms: S: My neck is just killing me today.  Pain Assessment Currently in Pain?: Yes Pain Score: 7  Pain Location: Neck Pain Orientation: Right Pain Type: Acute pain  Precautions/Restrictions  Precautions Precautions: None  Exercise/Treatments Supine Protraction: PROM;10 reps Horizontal ABduction: PROM;10 reps External Rotation: PROM;10 reps Internal Rotation: PROM;10 reps Flexion: PROM;10 reps ABduction: PROM;10 reps Seated Exercises Neck Retraction: 10 reps Cervical Rotation: Both;5 reps Lateral Flexion: 10 reps Supine Exercises Capital Flexion: 5 reps;5 secs Lateral Flexion: 5 reps     Modalities Modalities: Moist Heat Manual Therapy Manual Therapy: Myofascial release Myofascial Release: MFR to bilateral cervical region, trapezius, scapularis, and upper arm region to decrease fascial restrictions and increase joint mobility in a pain free zone. Gental traction to neck and RUE. Moist Heat Therapy Number Minutes Moist Heat: 10 Minutes Moist Heat Location: Other (comment);Shoulder (cervical region)  Occupational Therapy Assessment and Plan OT Assessment and Plan Clinical Impression Statement: A: MFR and manual stretching took up majority of session this date. Patient tolerated all stretches this date. Had great results with MFR. Continues to have increased fascial restrictions in cervical and right upper arm region.  OT Plan: P: Add AAROM supine and standing, X to V arms, corner stretch   Goals Short Term Goals Time to Complete Short Term Goals: 4 weeks Short  Term Goal 1: Pt will be educated on HEP. Short Term Goal 1 Progress: Progressing toward goal Short Term Goal 2: Pt will obtain PROM WFL in right shoulder for imporved ability to carry her pocketbook. Short Term Goal 2 Progress: Progressing toward goal Short Term Goal 3: Pt will have right neck and shoulder pain of less than 4/10. Short Term Goal 3 Progress: Progressing toward goal Short Term Goal 4: Pt will have right shoulder strength of atleast 3+/5 for imporved ability to do housework. Short Term Goal 4 Progress: Progressing toward goal Short Term Goal 5: Pt will have mod fascial restrictions in her neck and shoulder regions. Short Term Goal 5 Progress: Progressing toward goal Short Term Goal 6: Pt's grip strenght will improve by atleast 10 lbs. Short Term Goal 6 Progress: Progressing toward goal Long Term Goals Long Term Goal 1: Pt will attain highest level of functioning in all ADL, IADL, and leisure tasks. Long Term Goal 1 Progress: Progressing toward goal Long Term Goal 2: Pt will have right shoulder AROM WNL in order to vacuum her house. Long Term Goal 2 Progress: Progressing toward goal Long Term Goal 3: Pt will have pain of less than 2/10 picking up her husband's oxygen tanks. Long Term Goal 3 Progress: Progressing toward goal Long Term Goal 4: Pt will had right shoulder strength of atleast 4/5 in order to vaccuum the house. Long Term Goal 4 Progress: Progressing toward goal Long Term Goal 5: Pt will have less than min fascial restrictions in her RUE. Long Term Goal 5 Progress: Progressing toward goal Long Term Goal 6: Pt's grip strength will improve by atleast 20 lbs Long Term Goal 6 Progress: Progressing toward goal  Problem List Patient Active Problem List   Diagnosis Date Noted  .  Muscle weakness (generalized) 11/12/2013  . Tight fascia 11/12/2013  . Decreased range of motion of shoulder 11/12/2013  . Cervical spondylosis without myelopathy 10/29/2013  . S/P carpal  tunnel release 02/19/2013  . CTS (carpal tunnel syndrome) 02/19/2013  . SHOULDER PAIN 10/06/2007  . IMPINGEMENT SYNDROME 10/06/2007  . RUPTURE ROTATOR CUFF 10/06/2007  . HIGH BLOOD PRESSURE 10/03/2007    End of Session Activity Tolerance: Patient tolerated treatment well General Behavior During Therapy: [facility] for tasks assessed/performed   Ailene Ravel, OTR/L,CBIS   11/25/2013, 1:48 PM

## 2013-11-27 ENCOUNTER — Ambulatory Visit (HOSPITAL_COMMUNITY)
Admission: RE | Admit: 2013-11-27 | Discharge: 2013-11-27 | Disposition: A | Discharge status: Home / Self Care | Payer: 59 | Source: Ambulatory Visit | Attending: Internal Medicine | Admitting: Internal Medicine

## 2013-11-27 DIAGNOSIS — M629 Disorder of muscle, unspecified: Secondary | ICD-10-CM

## 2013-11-27 DIAGNOSIS — M25619 Stiffness of unspecified shoulder, not elsewhere classified: Secondary | ICD-10-CM

## 2013-11-27 DIAGNOSIS — J4489 Other specified chronic obstructive pulmonary disease: Secondary | ICD-10-CM | POA: Insufficient documentation

## 2013-11-27 DIAGNOSIS — I10 Essential (primary) hypertension: Secondary | ICD-10-CM | POA: Insufficient documentation

## 2013-11-27 DIAGNOSIS — J449 Chronic obstructive pulmonary disease, unspecified: Secondary | ICD-10-CM | POA: Insufficient documentation

## 2013-11-27 DIAGNOSIS — M25519 Pain in unspecified shoulder: Secondary | ICD-10-CM | POA: Insufficient documentation

## 2013-11-27 DIAGNOSIS — IMO0001 Reserved for inherently not codable concepts without codable children: Secondary | ICD-10-CM | POA: Insufficient documentation

## 2013-11-27 DIAGNOSIS — M6289 Other specified disorders of muscle: Secondary | ICD-10-CM

## 2013-11-27 DIAGNOSIS — M6281 Muscle weakness (generalized): Secondary | ICD-10-CM | POA: Insufficient documentation

## 2013-11-27 NOTE — Progress Notes (Signed)
Occupational Therapy Treatment Patient Details  Name: Kimberly [name] MRN: [id number] Date of Birth: 05-25-1961  Today's Date: 11/27/2013 Time: 5427-0623 OT Time Calculation (min): 51 min MFR 7628-3151 21' Therex 7616-0737 30'  Visit#: 3 of 16  Re-eval: 12/10/13    Authorization:    Authorization Time Period:    Authorization Visit#:   of    Subjective Symptoms/Limitations Symptoms: S: After Wednesday I went home and took a pain pill. It knocked me out until that evening.  Pain Assessment Currently in Pain?: Yes Pain Score: 7  Pain Location: Neck Pain Orientation: Right Pain Type: Acute pain  Precautions/Restrictions  Precautions Precautions: None  Exercise/Treatments Supine Protraction: PROM;10 reps;AAROM;12 reps Horizontal ABduction: PROM;10 reps;AAROM;12 reps External Rotation: PROM;10 reps;AAROM;12 reps Internal Rotation: PROM;10 reps;AAROM;12 reps Flexion: PROM;10 reps;AAROM;12 reps ABduction: PROM;10 reps;AAROM;12 reps Stretches Other Neck Stretches: Lower trapezius stretch 3x - scanned exercise Other Neck Stretches: Corner stretch 10X Seated Exercises Neck Retraction: 10 reps Cervical Rotation: Both;5 reps Lateral Flexion: 10 reps (3 second holds) Shoulder Shrugs: 10 reps Other Seated Exercise: Cervial flexion/extension 10X Other Seated Exercise: [place] stretch, upper trapezius stretch, levator scapulae stretch, head and scapular retraction, 5X 3 second hold - see scanned exercise sheet Supine Exercises Capital Flexion: 10 reps;5 secs Lateral Flexion: 10 reps;Both     Manual Therapy Manual Therapy: Myofascial release Myofascial Release: MFR to bilateral cervical region, trapezius, scapularis, and upper arm region to decrease fascial restrictions and increase joint mobility in a pain free zone. Gental traction to neck and RUE  Occupational Therapy Assessment and Plan OT Assessment and Plan Clinical Impression Statement: A: Patient with significantly  decreased fascial restrictions this date. Added new cervical stretches and AAROM supine. Patient tolerated well. Added to HEP. Patient reports a pain score of 2/10 at end of session!! OT Plan: P: Add X to V arms, AAROM standing, Educate on proper body mechanics and posture.    Goals Short Term Goals Time to Complete Short Term Goals: 4 weeks Short Term Goal 1: Pt will be educated on HEP. Short Term Goal 2: Pt will obtain PROM WFL in right shoulder for imporved ability to carry her pocketbook. Short Term Goal 3: Pt will have right neck and shoulder pain of less than 4/10. Short Term Goal 4: Pt will have right shoulder strength of atleast 3+/5 for imporved ability to do housework. Short Term Goal 5: Pt will have mod fascial restrictions in her neck and shoulder regions. Short Term Goal 6: Pt's grip strenght will improve by atleast 10 lbs. Long Term Goals Long Term Goal 1: Pt will attain highest level of functioning in all ADL, IADL, and leisure tasks. Long Term Goal 2: Pt will have right shoulder AROM WNL in order to vacuum her house. Long Term Goal 3: Pt will have pain of less than 2/10 picking up her husband's oxygen tanks. Long Term Goal 4: Pt will had right shoulder strength of atleast 4/5 in order to vaccuum the house. Long Term Goal 5: Pt will have less than min fascial restrictions in her RUE. Long Term Goal 6: Pt's grip strength will improve by atleast 20 lbs  Problem List Patient Active Problem List   Diagnosis Date Noted  . Muscle weakness (generalized) 11/12/2013  . Tight fascia 11/12/2013  . Decreased range of motion of shoulder 11/12/2013  . Cervical spondylosis without myelopathy 10/29/2013  . S/P carpal tunnel release 02/19/2013  . CTS (carpal tunnel syndrome) 02/19/2013  . SHOULDER PAIN 10/06/2007  . IMPINGEMENT SYNDROME 10/06/2007  .  RUPTURE ROTATOR CUFF 10/06/2007  . HIGH BLOOD PRESSURE 10/03/2007    End of Session Activity Tolerance: Patient tolerated treatment  well General Behavior During Therapy: [facility] for tasks assessed/performed OT Plan of Care OT Home Exercise Plan: Cervical stretches OT Patient Instructions: handouts provided (scanned) and pt verbalized understanding Consulted and Agree with Plan of Care: Patient   Ailene Ravel, OTR/L,CBIS   11/27/2013, 12:10 PM

## 2013-11-30 ENCOUNTER — Ambulatory Visit (HOSPITAL_COMMUNITY)
Admission: RE | Admit: 2013-11-30 | Discharge: 2013-11-30 | Disposition: A | Discharge status: Home / Self Care | Payer: 59 | Source: Ambulatory Visit | Attending: Internal Medicine | Admitting: Internal Medicine

## 2013-11-30 DIAGNOSIS — M629 Disorder of muscle, unspecified: Secondary | ICD-10-CM

## 2013-11-30 DIAGNOSIS — M6281 Muscle weakness (generalized): Secondary | ICD-10-CM

## 2013-11-30 DIAGNOSIS — M6289 Other specified disorders of muscle: Secondary | ICD-10-CM

## 2013-11-30 DIAGNOSIS — M25619 Stiffness of unspecified shoulder, not elsewhere classified: Secondary | ICD-10-CM

## 2013-11-30 NOTE — Progress Notes (Signed)
Occupational Therapy Treatment Patient Details  Name: SHERRIA RIEMANN MRN: [id number] Date of Birth: 12-23-1960  Today's Date: 11/30/2013 Time: 0998-3382 OT Time Calculation (min): 40 min MFR 505-397 20' Therex 673-4193 53'  Visit#: 4 of 16  Re-eval: 12/10/13    Authorization:    Authorization Time Period:    Authorization Visit#:   of    Subjective Pain Assessment Currently in Pain?: Yes Pain Score: 6  Pain Location: Neck Pain Orientation: Right Pain Type: Acute pain  Precautions/Restrictions  Precautions Precautions: None  Exercise/Treatments Supine Protraction: PROM;10 reps;AAROM;15 reps Horizontal ABduction: PROM;10 reps;AAROM;15 reps External Rotation: PROM;10 reps;AAROM;15 reps Internal Rotation: PROM;10 reps;AAROM;15 reps Flexion: PROM;10 reps;AAROM;15 reps ABduction: PROM;10 reps;AAROM;15 reps Standing Exercises Other Standing Exercises: Standing head nod using wall; 10X with 10" hold Supine Exercises Capital Flexion: 10 reps;5 secs Lateral Flexion: 10 reps;Both Other Supine Exercise: Supine Head Nod and Supine Head Lift with Towel roll; 10X with 10" hold     Manual Therapy Manual Therapy: Myofascial release Myofascial Release: MFR to bilateral cervical region, trapezius, scapularis, and upper arm region to decrease fascial restrictions and increase joint mobility in a pain free zone. Gental traction to neck and RUE  Occupational Therapy Assessment and Plan OT Assessment and Plan Clinical Impression Statement: A: Increased reps with AAROM supine. Add Cervical strengthening exercises supine and standing. Unable to complete X to V arms and AAROM due to time contraint. Education and handout provided regarding body mechanics during household activities and posture.  OT Plan: P: AROM supine and standing, X to V arms   Goals Short Term Goals Time to Complete Short Term Goals: 4 weeks Short Term Goal 1: Pt will be educated on HEP. Short Term Goal 1 Progress:  Progressing toward goal Short Term Goal 2: Pt will obtain PROM WFL in right shoulder for imporved ability to carry her pocketbook. Short Term Goal 2 Progress: Progressing toward goal Short Term Goal 3: Pt will have right neck and shoulder pain of less than 4/10. Short Term Goal 3 Progress: Progressing toward goal Short Term Goal 4: Pt will have right shoulder strength of atleast 3+/5 for imporved ability to do housework. Short Term Goal 4 Progress: Progressing toward goal Short Term Goal 5: Pt will have mod fascial restrictions in her neck and shoulder regions. Short Term Goal 5 Progress: Progressing toward goal Short Term Goal 6: Pt's grip strenght will improve by atleast 10 lbs. Short Term Goal 6 Progress: Progressing toward goal Long Term Goals Long Term Goal 1: Pt will attain highest level of functioning in all ADL, IADL, and leisure tasks. Long Term Goal 1 Progress: Progressing toward goal Long Term Goal 2: Pt will have right shoulder AROM WNL in order to vacuum her house. Long Term Goal 2 Progress: Progressing toward goal Long Term Goal 3: Pt will have pain of less than 2/10 picking up her husband's oxygen tanks. Long Term Goal 3 Progress: Progressing toward goal Long Term Goal 4: Pt will had right shoulder strength of atleast 4/5 in order to vaccuum the house. Long Term Goal 4 Progress: Progressing toward goal Long Term Goal 5: Pt will have less than min fascial restrictions in her RUE. Long Term Goal 5 Progress: Progressing toward goal Long Term Goal 6: Pt's grip strength will improve by atleast 20 lbs Long Term Goal 6 Progress: Progressing toward goal  Problem List Patient Active Problem List   Diagnosis Date Noted  . Muscle weakness (generalized) 11/12/2013  . Tight fascia 11/12/2013  . Decreased  range of motion of shoulder 11/12/2013  . Cervical spondylosis without myelopathy 10/29/2013  . S/P carpal tunnel release 02/19/2013  . CTS (carpal tunnel syndrome) 02/19/2013  .  SHOULDER PAIN 10/06/2007  . IMPINGEMENT SYNDROME 10/06/2007  . RUPTURE ROTATOR CUFF 10/06/2007  . HIGH BLOOD PRESSURE 10/03/2007    End of Session Activity Tolerance: Patient tolerated treatment well General Behavior During Therapy: [facility] for tasks assessed/performed OT Plan of Care OT Home Exercise Plan: Cervical strengthening exercises  OT Patient Instructions: handouts provided (scanned) and pt verbalized understanding Consulted and Agree with Plan of Care: Patient   Ailene Ravel, OTR/L,CBIS   11/30/2013, 10:22 AM

## 2013-12-01 ENCOUNTER — Ambulatory Visit (HOSPITAL_COMMUNITY): Payer: 59

## 2013-12-02 ENCOUNTER — Inpatient Hospital Stay (HOSPITAL_COMMUNITY): Admission: RE | Admit: 2013-12-02 | Payer: 59 | Source: Ambulatory Visit

## 2013-12-03 ENCOUNTER — Ambulatory Visit (HOSPITAL_COMMUNITY): Payer: 59

## 2013-12-07 ENCOUNTER — Ambulatory Visit (HOSPITAL_COMMUNITY)
Admission: RE | Admit: 2013-12-07 | Discharge: 2013-12-07 | Disposition: A | Discharge status: Home / Self Care | Payer: 59 | Source: Ambulatory Visit | Attending: Internal Medicine | Admitting: Internal Medicine

## 2013-12-07 DIAGNOSIS — M629 Disorder of muscle, unspecified: Secondary | ICD-10-CM

## 2013-12-07 DIAGNOSIS — M6281 Muscle weakness (generalized): Secondary | ICD-10-CM

## 2013-12-07 DIAGNOSIS — M25619 Stiffness of unspecified shoulder, not elsewhere classified: Secondary | ICD-10-CM

## 2013-12-07 DIAGNOSIS — M6289 Other specified disorders of muscle: Secondary | ICD-10-CM

## 2013-12-07 NOTE — Progress Notes (Signed)
Occupational Therapy Treatment Patient Details  Name: Kimberly [name] MRN: [id number] Date of Birth: 10/11/1960  Today's Date: 12/07/2013 Time: 8676-1950 OT Time Calculation (min): 48 min MFR 9326-7124 15' Therex 5809-9833 19' ES/Heat 1140-1150 10'  Visit#: 5 of 16  Re-eval: 12/10/13    Authorization: [facility]  Authorization Time Period:    Authorization Visit#:   of    Subjective Symptoms/Limitations Symptoms: S: I missed last session because I was sick. I had a horrible stabbing pain in my neck. I just couldn't come in. Pain Assessment Currently in Pain?: Yes Pain Score: 6  Pain Location: Neck Pain Orientation: Right Pain Type: Acute pain  Precautions/Restrictions  Precautions Precautions: None  Exercise/Treatments Supine Protraction: PROM;10 reps;AROM;12 reps Horizontal ABduction: PROM;10 reps;AROM;12 reps External Rotation: PROM;10 reps;AROM;12 reps Internal Rotation: PROM;10 reps;AROM;12 reps Flexion: PROM;10 reps;AROM;12 reps ABduction: PROM;10 reps;AROM;12 reps Supine Exercises Capital Flexion: 10 reps;5 secs Lateral Flexion: 10 reps;Both Other Supine Exercise: Supine Head Nod and Supine Head Lift with Towel roll; 10X with 10" hold     Modalities Modalities: Moist Heat;Electrical Stimulation Manual Therapy Manual Therapy: Myofascial release Myofascial Release: MFR to bilateral cervical region, trapezius, scapularis, and upper arm region to decrease fascial restrictions and increase joint mobility in a pain free zone. Gental traction to neck and RUE Moist Heat Therapy Number Minutes Moist Heat: 10 Minutes Moist Heat Location: Shoulder Electrical Stimulation Electrical Stimulation Location: Right trapezius region Electrical Stimulation Action: Interferential Electrical Stimulation Parameters: 5.4CV Electrical Stimulation Goals: Pain  Occupational Therapy Assessment and Plan OT Assessment and Plan Clinical Impression Statement: A: Completed  all exercises supine this date. Added ES with Heat for pain management. Patient reports a pain level of 2/10 at end of session!  OT Plan: P: Reassess   Goals Short Term Goals Time to Complete Short Term Goals: 4 weeks Short Term Goal 1: Pt will be educated on HEP. Short Term Goal 1 Progress: Progressing toward goal Short Term Goal 2: Pt will obtain PROM WFL in right shoulder for imporved ability to carry her pocketbook. Short Term Goal 2 Progress: Progressing toward goal Short Term Goal 3: Pt will have right neck and shoulder pain of less than 4/10. Short Term Goal 3 Progress: Progressing toward goal Short Term Goal 4: Pt will have right shoulder strength of atleast 3+/5 for imporved ability to do housework. Short Term Goal 4 Progress: Progressing toward goal Short Term Goal 5: Pt will have mod fascial restrictions in her neck and shoulder regions. Short Term Goal 5 Progress: Progressing toward goal Short Term Goal 6: Pt's grip strenght will improve by atleast 10 lbs. Short Term Goal 6 Progress: Progressing toward goal Long Term Goals Time to Complete Long Term Goals: 6 weeks Long Term Goal 1: Pt will attain highest level of functioning in all ADL, IADL, and leisure tasks. Long Term Goal 1 Progress: Progressing toward goal Long Term Goal 2: Pt will have right shoulder AROM WNL in order to vacuum her house. Long Term Goal 2 Progress: Progressing toward goal Long Term Goal 3: Pt will have pain of less than 2/10 picking up her husband's oxygen tanks. Long Term Goal 3 Progress: Progressing toward goal Long Term Goal 4: Pt will had right shoulder strength of atleast 4/5 in order to vaccuum the house. Long Term Goal 4 Progress: Progressing toward goal Long Term Goal 5: Pt will have less than min fascial restrictions in her RUE. Long Term Goal 5 Progress: Progressing toward goal Long Term Goal 6: Pt's grip strength  will improve by atleast 20 lbs Long Term Goal 6 Progress: Progressing toward  goal  Problem List Patient Active Problem List   Diagnosis Date Noted  . Muscle weakness (generalized) 11/12/2013  . Tight fascia 11/12/2013  . Decreased range of motion of shoulder 11/12/2013  . Cervical spondylosis without myelopathy 10/29/2013  . S/P carpal tunnel release 02/19/2013  . CTS (carpal tunnel syndrome) 02/19/2013  . SHOULDER PAIN 10/06/2007  . IMPINGEMENT SYNDROME 10/06/2007  . RUPTURE ROTATOR CUFF 10/06/2007  . HIGH BLOOD PRESSURE 10/03/2007    End of Session Activity Tolerance: Patient tolerated treatment well General Behavior During Therapy: [facility] for tasks assessed/performed  Ailene Ravel, OTR/L,CBIS   12/07/2013, 11:49 AM

## 2013-12-08 ENCOUNTER — Ambulatory Visit (HOSPITAL_COMMUNITY): Payer: 59

## 2013-12-09 ENCOUNTER — Ambulatory Visit (HOSPITAL_COMMUNITY)
Admission: RE | Admit: 2013-12-09 | Discharge: 2013-12-09 | Disposition: A | Discharge status: Home / Self Care | Payer: 59 | Source: Ambulatory Visit | Attending: Internal Medicine | Admitting: Internal Medicine

## 2013-12-09 DIAGNOSIS — M6289 Other specified disorders of muscle: Secondary | ICD-10-CM

## 2013-12-09 DIAGNOSIS — M25619 Stiffness of unspecified shoulder, not elsewhere classified: Secondary | ICD-10-CM

## 2013-12-09 DIAGNOSIS — M629 Disorder of muscle, unspecified: Secondary | ICD-10-CM

## 2013-12-09 DIAGNOSIS — M6281 Muscle weakness (generalized): Secondary | ICD-10-CM

## 2013-12-09 NOTE — Evaluation (Signed)
Occupational Therapy Reassessment and Discharge Summary  Patient Details  Name: Kimberly [name] MRN: [id number] Date of Birth: 1960/10/20  Today's Date: 12/09/2013 Time: 5643-3295 OT Time Calculation (min): 50 min MFR 1884-1660 12' Reassess 1512-1545 38'  Visit#: 6 of 16  Re-eval: 12/10/13  Assessment Diagnosis: Right Neck Pain  Authorization: [employer]  Authorization Time Period:    Authorization Visit#:   of     Past Medical History:  Past Medical History  Diagnosis Date  . Hypertension   . COPD (chronic obstructive pulmonary disease)   . Herpes    Past Surgical History:  Past Surgical History  Procedure Laterality Date  . Abdominal hysterectomy    . Rotator cuff repair Left   . Foreign body removal Left     knee-as child  . Carpal tunnel release Left 02/16/2013    Procedure: CARPAL TUNNEL RELEASE;  Surgeon: Carole Civil, MD;  Location: [facility];  Service: Orthopedics;  Laterality: Left;  . Carpal tunnel release Right 03/27/2013    Procedure: RIGHT CARPAL TUNNEL RELEASE;  Surgeon: Carole Civil, MD;  Location: [facility];  Service: Orthopedics;  Laterality: Right;    Subjective Symptoms/Limitations Symptoms: S: I have pain all the time. It never goes away. Special Tests: FOTO 50/100 (50% limited) On eval: 42/100 (58% impaired) Pain Assessment Currently in Pain?: Yes Pain Score: 6  Pain Location: Neck Pain Orientation: Right Pain Type: Acute pain  Precautions/Restrictions  Precautions Precautions: None   Assessment Additional Assessments RUE AROM (degrees) RUE Overall AROM Comments: Assess in sitting. IR/ER abducted Right Shoulder Flexion: 161 Degrees (on eval: 149) Right Shoulder ABduction: 143 Degrees (on eval: 125) Right Shoulder Internal Rotation: 20 Degrees (on eval: 78 ) Right Shoulder External Rotation: 71 Degrees (on eval: 62) RUE Strength Right Shoulder Flexion:  (4-/5 (on eval: 3-/5)) Right Shoulder ABduction:  (4-/5 (on eval:  3-/5)) Right Shoulder Internal Rotation: 4/5 (on eval: same) Right Shoulder External Rotation: 3+/5 (on eval: same) Grip (lbs): 41 (on eval: 21) Lateral Pinch: 16 lbs (on eval: 14) 3 Point Pinch: 12 lbs (on eval: 8) Cervical AROM Overall Cervical AROM Comments: Assessed in sitting in cm Cervical Flexion: WNL (on eval: 3.5 cm) Cervical Extension: 6 cm  (on eval: 16) Cervical - Right Side Bend: 12 cm (on eval: 16) Cervical - Left Side Bend: 11 cm  (on eval: 17) Cervical - Right Rotation: 9.5 cm (on eval: 16) Cervical - Left Rotation: 10.5 (on eval: 13) Palpation Palpation: Pt with max fascial restrictions in upper arm, bicep, upper trap, and scapular regions.   Exercise/Treatments     Manual Therapy Manual Therapy: Myofascial release Myofascial Release: MFR to bilateral cervical region, trapezius, scapularis, and upper arm region to decrease fascial restrictions and increase joint mobility in a pain free zone. Gental traction to neck and RUE  Occupational Therapy Assessment and Plan OT Assessment and Plan Clinical Impression Statement: A: Reassessment and discharge completed this date. Patient has met 4/6 STGs and 2/6 LTGs. Pt has had only temporary pain relief during tx sessions which soon returns shortly afterwards. Patient has increased her AROM of her neck and right shoulder. RUE strength has increased as well. After dicussing results, patient and therapist agreed with a discharge from therapy as we were unable to help decrease pain despite education on pain management . OT Plan: P: D/C from therapy.   Goals Short Term Goals Time to Complete Short Term Goals: 4 weeks Short Term Goal 1: Pt will be educated on HEP.  Short Term Goal 1 Progress: Met Short Term Goal 2: Pt will obtain PROM WFL in right shoulder for imporved ability to carry her pocketbook. Short Term Goal 2 Progress: Met Short Term Goal 3: Pt will have right neck and shoulder pain of less than 4/10. Short Term Goal 3  Progress: Not met Short Term Goal 4: Pt will have right shoulder strength of at least 3+/5 for improved ability to do housework. Short Term Goal 4 Progress: Met Short Term Goal 5: Pt will have mod fascial restrictions in her neck and shoulder regions. Short Term Goal 5 Progress: Not met Short Term Goal 6: Pt's grip strenght will improve by a tleast 10 lbs. Short Term Goal 6 Progress: Met Long Term Goals Time to Complete Long Term Goals: 6 weeks Long Term Goal 1: Pt will attain highest level of functioning in all ADL, IADL, and leisure tasks. Long Term Goal 1 Progress: Not met Long Term Goal 2: Pt will have right shoulder AROM WNL in order to vacuum her house. Long Term Goal 2 Progress: Met Long Term Goal 3: Pt will have pain of less than 2/10 picking up her husband's oxygen tanks. Long Term Goal 3 Progress: Not met Long Term Goal 4: Pt will had right shoulder strength of atleast 4/5 in order to vaccuum the house. Long Term Goal 4 Progress: Not met Long Term Goal 5: Pt will have less than min fascial restrictions in her RUE. Long Term Goal 5 Progress: Not met Long Term Goal 6: Pt's grip strength will improve by at least 20 lbs Long Term Goal 6 Progress: Met  Problem List Patient Active Problem List   Diagnosis Date Noted  . Muscle weakness (generalized) 11/12/2013  . Tight fascia 11/12/2013  . Decreased range of motion of shoulder 11/12/2013  . Cervical spondylosis without myelopathy 10/29/2013  . S/P carpal tunnel release 02/19/2013  . CTS (carpal tunnel syndrome) 02/19/2013  . SHOULDER PAIN 10/06/2007  . IMPINGEMENT SYNDROME 10/06/2007  . RUPTURE ROTATOR CUFF 10/06/2007  . HIGH BLOOD PRESSURE 10/03/2007    End of Session Activity Tolerance: Patient tolerated treatment well General Behavior During Therapy: [facility] for tasks assessed/performed   Ailene Ravel, OTR/L,CBIS   12/09/2013, 3:58 PM  Physician Documentation Your signature is required to indicate approval of the  treatment plan as stated above.  Please sign and either send electronically or make a copy of this report for your files and return this physician signed original.  Please mark one 1.__approve of plan  2. ___approve of plan with the following conditions.   ______________________________                                                          _____________________ Physician Signature  Date  

## 2013-12-10 ENCOUNTER — Ambulatory Visit (HOSPITAL_COMMUNITY): Payer: 59

## 2013-12-15 ENCOUNTER — Ambulatory Visit (INDEPENDENT_AMBULATORY_CARE_PROVIDER_SITE_OTHER): Payer: 59 | Admitting: Orthopedic Surgery

## 2013-12-15 ENCOUNTER — Encounter: Payer: Self-pay | Admitting: Orthopedic Surgery

## 2013-12-15 VITALS — Ht 61.0 in | Wt 156.0 lb

## 2013-12-15 DIAGNOSIS — M47812 Spondylosis without myelopathy or radiculopathy, cervical region: Secondary | ICD-10-CM

## 2013-12-15 DIAGNOSIS — M542 Cervicalgia: Secondary | ICD-10-CM

## 2013-12-15 MED ORDER — GABAPENTIN 100 MG PO CAPS: ORAL_CAPSULE | ORAL | Status: DC

## 2013-12-15 MED ORDER — METHOCARBAMOL 500 MG PO TABS: 500.0000 mg | ORAL_TABLET | Freq: Three times a day (TID) | ORAL | Status: DC

## 2013-12-15 MED ORDER — HYDROCODONE-ACETAMINOPHEN 5-325 MG PO TABS: 1.0000 | ORAL_TABLET | Freq: Four times a day (QID) | ORAL | Status: DC | PRN

## 2013-12-15 NOTE — Progress Notes (Signed)
Patient ID: Kimberly [name], female   DOB: 11-07-1960, 53 y.o.   MRN: [id number]  Chief Complaint  Patient presents with  . Follow-up    6 week recheck on neck and response to medication.    HISTORY: 53 year old female presents with persistent neck pain and bilateral radicular symptoms which she's now had 09/30/2013. No associated trauma. Pain is actually worsening after treatment with ibuprofen, hydrocodone, Robaxin, gabapentin and physical therapy. Her pain is now 8/10.  Physical exam findings includes tenderness at C6-C7 right trapezius left trapezius weakness in the biceps tendon on the right arm reflexes and sensation  X-rays show spondylosis at C5-C7 and C4.  At this point I really have no other treatment options than to increase her gabapentin to get an MRI to see if she needs epidural steroid injections or neurosurgical referral  Meds ordered this encounter  Medications  . HYDROcodone-acetaminophen (NORCO/VICODIN) 5-325 MG per tablet    Sig: Take 1 tablet by mouth every 6 (six) hours as needed for moderate pain.    Dispense:  30 tablet    Refill:  0  . gabapentin (NEURONTIN) 100 MG capsule    Sig: 2 tabs every 8 hours    Dispense:  90 capsule    Refill:  2  . methocarbamol (ROBAXIN) 500 MG tablet    Sig: Take 1 tablet (500 mg total) by mouth 3 (three) times daily.    Dispense:  60 tablet    Refill:  1   Orders Placed This Encounter  Procedures  . MR Cervical Spine Wo Contrast    Standing Status: Future     Number of Occurrences:      Standing Expiration Date: 02/15/2015    Order Specific Question:  Reason for Exam (SYMPTOM  OR DIAGNOSIS REQUIRED)    Answer:  bilateral arm pain    Order Specific Question:  Is the patient pregnant?    Answer:  No    Order Specific Question:  Preferred imaging location?    Answer:  [facility]    Order Specific Question:  Does the patient have a pacemaker or implanted devices?    Answer:  No    Order Specific Question:  What  is the patient's sedation requirement?    Answer:  No Sedation    See me after mri to discuss further treatment options

## 2013-12-15 NOTE — Patient Instructions (Addendum)
Mri c spine   Increase gabapentin to 200 mg 3 x a  day

## 2013-12-18 ENCOUNTER — Emergency Department (HOSPITAL_COMMUNITY)
Admission: EM | Admit: 2013-12-18 | Discharge: 2013-12-18 | Disposition: A | Discharge status: Home / Self Care | Payer: 59 | Attending: Emergency Medicine | Admitting: Emergency Medicine

## 2013-12-18 ENCOUNTER — Encounter (HOSPITAL_COMMUNITY): Payer: Self-pay | Admitting: Emergency Medicine

## 2013-12-18 DIAGNOSIS — I10 Essential (primary) hypertension: Secondary | ICD-10-CM | POA: Insufficient documentation

## 2013-12-18 DIAGNOSIS — R102 Pelvic and perineal pain: Secondary | ICD-10-CM

## 2013-12-18 DIAGNOSIS — B373 Candidiasis of vulva and vagina: Secondary | ICD-10-CM

## 2013-12-18 DIAGNOSIS — J449 Chronic obstructive pulmonary disease, unspecified: Secondary | ICD-10-CM | POA: Insufficient documentation

## 2013-12-18 DIAGNOSIS — Z79899 Other long term (current) drug therapy: Secondary | ICD-10-CM | POA: Insufficient documentation

## 2013-12-18 DIAGNOSIS — B3731 Acute candidiasis of vulva and vagina: Secondary | ICD-10-CM | POA: Insufficient documentation

## 2013-12-18 DIAGNOSIS — J4489 Other specified chronic obstructive pulmonary disease: Secondary | ICD-10-CM | POA: Insufficient documentation

## 2013-12-18 DIAGNOSIS — R000 Tachycardia, unspecified: Secondary | ICD-10-CM | POA: Insufficient documentation

## 2013-12-18 LAB — BASIC METABOLIC PANEL
BUN: 8 mg/dL (ref 6–23)
CALCIUM: 9.6 mg/dL (ref 8.4–10.5)
CO2: 29 meq/L (ref 19–32)
Chloride: 100 mEq/L (ref 96–112)
Creatinine, Ser: 0.65 mg/dL (ref 0.50–1.10)
GFR calc Af Amer: >90 mL/min (ref >90)
GFR calc non Af Amer: >90 mL/min (ref >90)
GLUCOSE: 109 mg/dL — AB (ref 70–99)
POTASSIUM: 3.9 meq/L (ref 3.7–5.3)
SODIUM: 143 meq/L (ref 137–147)

## 2013-12-18 LAB — URINALYSIS, ROUTINE W REFLEX MICROSCOPIC
Bilirubin Urine: NEGATIVE
GLUCOSE, UA: NEGATIVE mg/dL
Ketones, ur: NEGATIVE mg/dL
Leukocytes, UA: NEGATIVE
Nitrite: NEGATIVE
Protein, ur: NEGATIVE mg/dL
SPECIFIC GRAVITY, URINE: 1.015 (ref 1.005–1.030)
Urobilinogen, UA: 0.2 mg/dL (ref 0.0–1.0)
pH: 7 (ref 5.0–8.0)

## 2013-12-18 LAB — URINE MICROSCOPIC-ADD ON

## 2013-12-18 LAB — CBC WITH DIFFERENTIAL/PLATELET
Basophils Absolute: 0.1 10*3/uL (ref 0.0–0.1)
Basophils Relative: 1 % (ref 0–1)
EOS PCT: 2 % (ref 0–5)
Eosinophils Absolute: 0.2 10*3/uL (ref 0.0–0.7)
HEMATOCRIT: 41.1 % (ref 36.0–46.0)
HEMOGLOBIN: 13.5 g/dL (ref 12.0–15.0)
LYMPHS ABS: 3.4 10*3/uL (ref 0.7–4.0)
LYMPHS PCT: 37 % (ref 12–46)
MCH: 30.3 pg (ref 26.0–34.0)
MCHC: 32.8 g/dL (ref 30.0–36.0)
MCV: 92.2 fL (ref 78.0–100.0)
MONOS PCT: 9 % (ref 3–12)
Monocytes Absolute: 0.9 10*3/uL (ref 0.1–1.0)
Neutro Abs: 4.9 10*3/uL (ref 1.7–7.7)
Neutrophils Relative %: 51 % (ref 43–77)
PLATELETS: 276 10*3/uL (ref 150–400)
RBC: 4.46 MIL/uL (ref 3.87–5.11)
RDW: 12 % (ref 11.5–15.5)
WBC: 9.4 10*3/uL (ref 4.0–10.5)

## 2013-12-18 LAB — RPR

## 2013-12-18 LAB — WET PREP, GENITAL
Trich, Wet Prep: NONE SEEN
WBC, Wet Prep HPF POC: NONE SEEN

## 2013-12-18 LAB — HIV ANTIBODY (ROUTINE TESTING W REFLEX): HIV: NONREACTIVE

## 2013-12-18 MED ORDER — ONDANSETRON HCL 4 MG/2ML IJ SOLN
4.0000 mg | Freq: Once | INTRAMUSCULAR | Status: AC
  Administered 2013-12-18: 4 mg via INTRAVENOUS
  Filled 2013-12-18: qty 2

## 2013-12-18 MED ORDER — OXYCODONE-ACETAMINOPHEN 5-325 MG PO TABS: 1.0000 | ORAL_TABLET | ORAL | Status: DC | PRN

## 2013-12-18 MED ORDER — MORPHINE SULFATE 4 MG/ML IJ SOLN
4.0000 mg | Freq: Once | INTRAMUSCULAR | Status: AC
  Administered 2013-12-18: 4 mg via INTRAVENOUS
  Filled 2013-12-18: qty 1

## 2013-12-18 MED ORDER — FLUCONAZOLE 150 MG PO TABS: 150.0000 mg | ORAL_TABLET | Freq: Once | ORAL | Status: DC

## 2013-12-18 NOTE — ED Notes (Signed)
Pain, burning and frequency with urination started last night

## 2013-12-18 NOTE — Discharge Instructions (Signed)
Candidal Vulvovaginitis Candidal vulvovaginitis is an infection of the vagina and vulva. The vulva is the skin around the opening of the vagina. This may cause itching and discomfort in and around the vagina.  HOME CARE  Only take medicine as told by your doctor.  Do not have sex (intercourse) until the infection is healed or as told by your doctor.  Practice safe sex.  Tell your sex partner about your infection.  Do not douche or use tampons.  Wear cotton underwear. Do not wear tight pants or panty hose.  Eat yogurt. This may help treat and prevent yeast infections. GET HELP RIGHT AWAY IF:   You have a fever.  Your problems get worse during treatment or do not get better in 3 days.  You have discomfort, irritation, or itching in your vagina or vulva area.  You have pain after sex.  You start to get belly (abdominal) pain. MAKE SURE YOU:  Understand these instructions.  Will watch your condition.  Will get help right away if you are not doing well or get worse. Document Released: 10/12/2008 Document Revised: 10/08/2011 Document Reviewed: 10/12/2008 [facility] Patient Information 2014 [place], [place].  Fluconazole tablets What is this medicine? FLUCONAZOLE (floo KON na zole) is an antifungal medicine. It is used to treat certain kinds of fungal or yeast infections. This medicine may be used for other purposes; ask your health care provider or pharmacist if you have questions. COMMON BRAND NAME(S): Diflucan What should I tell my health care provider before I take this medicine? They need to know if you have any of these conditions: -electrolyte abnormalities -history of irregular heart beat -kidney disease -an unusual or allergic reaction to fluconazole, other azole antifungals, medicines, foods, dyes, or preservatives -pregnant or trying to get pregnant -breast-feeding How should I use this medicine? Take this medicine by mouth. Follow the directions on the  prescription label. Do not take your medicine more often than directed. Talk to your pediatrician regarding the use of this medicine in children. Special care may be needed. This medicine has been used in children as young as 36 months of age. Overdosage: If you think you have taken too much of this medicine contact a poison control center or emergency room at once. NOTE: This medicine is only for you. Do not share this medicine with others. What if I miss a dose? If you miss a dose, take it as soon as you can. If it is almost time for your next dose, take only that dose. Do not take double or extra doses. What may interact with this medicine? Do not take this medicine with any of the following medications: -astemizole -certain medicines for irregular heart beat like dofetilide, dronedarone, quinidine -cisapride -erythromycin -lomitapide -other medicines that prolong the QT interval (cause an abnormal heart rhythm) -pimozide -terfenadine -thioridazine -tolvaptan -ziprasidone  This medicine may also interact with the following medications: -antiviral medicines for HIV or AIDS -birth control pills -certain antibiotics like rifabutin, rifampin -certain medicines for blood pressure like amlodipine, isradipine, felodipine, hydrochlorothiazide, losartan, nifedipine -certain medicines for cancer like cyclophosphamide, vinblastine, vincristine -certain medicines for cholesterol like atorvastatin, lovastatin, fluvastatin, simvastatin -certain medicines for depression, anxiety, or psychotic disturbances like amitriptyline, midazolam, nortriptyline, triazolam -certain medicines for diabetes like glipizide, glyburide, tolbutamide -certain medicines for pain like alfentanil, fentanyl, methadone -certain medicines for seizures like carbamazepine, phenytoin -certain medicines that treat or prevent blood clots like warfarin -halofantrine -medicines that lower your chance of fighting infection like  cyclosporine, prednisone, tacrolimus -  NSAIDS, medicines for pain and inflammation, like celecoxib, diclofenac, flurbiprofen, ibuprofen, meloxicam, naproxen -other medicines for fungal infections -sirolimus -theophylline -tofacitinib This list may not describe all possible interactions. Give your health care provider a list of all the medicines, herbs, non-prescription drugs, or dietary supplements you use. Also tell them if you smoke, drink alcohol, or use illegal drugs. Some items may interact with your medicine. What should I watch for while using this medicine? Visit your doctor or health care professional for regular checkups. If you are taking this medicine for a long time you may need blood work. Tell your doctor if your symptoms do not improve. Some fungal infections need many weeks or months of treatment to cure. Alcohol can increase possible damage to your liver. Avoid alcoholic drinks. If you have a vaginal infection, do not have sex until you have finished your treatment. You can wear a sanitary napkin. Do not use tampons. Wear freshly washed cotton, not synthetic, panties. What side effects may I notice from receiving this medicine? Side effects that you should report to your doctor or health care professional as soon as possible: -allergic reactions like skin rash or itching, hives, swelling of the lips, mouth, tongue, or throat -dark urine -feeling dizzy or faint -irregular heartbeat or chest pain -redness, blistering, peeling or loosening of the skin, including inside the mouth -trouble breathing -unusual bruising or bleeding -vomiting -yellowing of the eyes or skin  Side effects that usually do not require medical attention (report to your doctor or health care professional if they continue or are bothersome): -changes in how food tastes -diarrhea -headache -stomach upset or nausea This list may not describe all possible side effects. Call your doctor for medical advice  about side effects. You may report side effects to FDA at 1-800-FDA-1088. Where should I keep my medicine? Keep out of the reach of children. Store at room temperature below 30 degrees C (86 degrees F). Throw away any medicine after the expiration date. NOTE: This sheet is a summary. It may not cover all possible information. If you have questions about this medicine, talk to your doctor, pharmacist, or health care provider.  2014, Elsevier/Gold Standard. (2013-02-21 16:13:04)  Acetaminophen; Oxycodone tablets What is this medicine? ACETAMINOPHEN; OXYCODONE (a set a MEE noe fen; ox i KOE done) is a pain reliever. It is used to treat mild to moderate pain. This medicine may be used for other purposes; ask your health care provider or pharmacist if you have questions. COMMON BRAND NAME(S): Endocet, Magnacet, Narvox, Percocet, Perloxx, Primalev, Primlev, Roxicet, Xolox What should I tell my health care provider before I take this medicine? They need to know if you have any of these conditions: -brain tumor -Crohn's disease, inflammatory bowel disease, or ulcerative colitis -drug abuse or addiction -head injury -heart or circulation problems -if you often drink alcohol -kidney disease or problems going to the bathroom -liver disease -lung disease, asthma, or breathing problems -an unusual or allergic reaction to acetaminophen, oxycodone, other opioid analgesics, other medicines, foods, dyes, or preservatives -pregnant or trying to get pregnant -breast-feeding How should I use this medicine? Take this medicine by mouth with a full glass of water. Follow the directions on the prescription label. Take your medicine at regular intervals. Do not take your medicine more often than directed. Talk to your pediatrician regarding the use of this medicine in children. Special care may be needed. Patients over 55 years old may have a stronger reaction and need a smaller dose. Overdosage:  If you think  you have taken too much of this medicine contact a poison control center or emergency room at once. NOTE: This medicine is only for you. Do not share this medicine with others. What if I miss a dose? If you miss a dose, take it as soon as you can. If it is almost time for your next dose, take only that dose. Do not take double or extra doses. What may interact with this medicine? -alcohol -antihistamines -barbiturates like amobarbital, butalbital, butabarbital, methohexital, pentobarbital, phenobarbital, thiopental, and secobarbital -benztropine -drugs for bladder problems like solifenacin, trospium, oxybutynin, tolterodine, hyoscyamine, and methscopolamine -drugs for breathing problems like ipratropium and tiotropium -drugs for certain stomach or intestine problems like propantheline, homatropine methylbromide, glycopyrrolate, atropine, belladonna, and dicyclomine -general anesthetics like etomidate, ketamine, nitrous oxide, propofol, desflurane, enflurane, halothane, isoflurane, and sevoflurane -medicines for depression, anxiety, or psychotic disturbances -medicines for sleep -muscle relaxants -naltrexone -narcotic medicines (opiates) for pain -phenothiazines like perphenazine, thioridazine, chlorpromazine, mesoridazine, fluphenazine, prochlorperazine, promazine, and trifluoperazine -scopolamine -tramadol -trihexyphenidyl This list may not describe all possible interactions. Give your health care provider a list of all the medicines, herbs, non-prescription drugs, or dietary supplements you use. Also tell them if you smoke, drink alcohol, or use illegal drugs. Some items may interact with your medicine. What should I watch for while using this medicine? Tell your doctor or health care professional if your pain does not go away, if it gets worse, or if you have new or a different type of pain. You may develop tolerance to the medicine. Tolerance means that you will need a higher dose of the  medication for pain relief. Tolerance is normal and is expected if you take this medicine for a long time. Do not suddenly stop taking your medicine because you may develop a severe reaction. Your body becomes used to the medicine. This does NOT mean you are addicted. Addiction is a behavior related to getting and using a drug for a non-medical reason. If you have pain, you have a medical reason to take pain medicine. Your doctor will tell you how much medicine to take. If your doctor wants you to stop the medicine, the dose will be slowly lowered over time to avoid any side effects. You may get drowsy or dizzy. Do not drive, use machinery, or do anything that needs mental alertness until you know how this medicine affects you. Do not stand or sit up quickly, especially if you are an older patient. This reduces the risk of dizzy or fainting spells. Alcohol may interfere with the effect of this medicine. Avoid alcoholic drinks. There are different types of narcotic medicines (opiates) for pain. If you take more than one type at the same time, you may have more side effects. Give your health care provider a list of all medicines you use. Your doctor will tell you how much medicine to take. Do not take more medicine than directed. Call emergency for help if you have problems breathing. The medicine will cause constipation. Try to have a bowel movement at least every 2 to 3 days. If you do not have a bowel movement for 3 days, call your doctor or health care professional. Do not take Tylenol (acetaminophen) or medicines that have acetaminophen with this medicine. Too much acetaminophen can be very dangerous. Many nonprescription medicines contain acetaminophen. Always read the labels carefully to avoid taking more acetaminophen. What side effects may I notice from receiving this medicine? Side effects that you should report to  your doctor or health care professional as soon as possible: -allergic reactions like  skin rash, itching or hives, swelling of the face, lips, or tongue -breathing difficulties, wheezing -confusion -light headedness or fainting spells -severe stomach pain -unusually weak or tired -yellowing of the skin or the whites of the eyes  Side effects that usually do not require medical attention (report to your doctor or health care professional if they continue or are bothersome): -dizziness -drowsiness -nausea -vomiting This list may not describe all possible side effects. Call your doctor for medical advice about side effects. You may report side effects to FDA at 1-800-FDA-1088. Where should I keep my medicine? Keep out of the reach of children. This medicine can be abused. Keep your medicine in a safe place to protect it from theft. Do not share this medicine with anyone. Selling or giving away this medicine is dangerous and against the law. Store at room temperature between 20 and 25 degrees C (68 and 77 degrees F). Keep container tightly closed. Protect from light. This medicine may cause accidental overdose and death if it is taken by other adults, children, or pets. Flush any unused medicine down the toilet to reduce the chance of harm. Do not use the medicine after the expiration date. NOTE: This sheet is a summary. It may not cover all possible information. If you have questions about this medicine, talk to your doctor, pharmacist, or health care provider.  2014, Elsevier/Gold Standard. (2013-03-09 13:17:35)

## 2013-12-18 NOTE — ED Provider Notes (Signed)
CSN: [id number]     Arrival date & time 12/18/13  0024 History  This chart was scribed for Delora Fuel, MD by Roxan Diesel, ED scribe.  This patient was seen in room APA09/APA09 and the patient's care was started at 12:33 AM.   Chief Complaint  Patient presents with  . uti symptoms     The history is provided by the patient. No language interpreter was used.    HPI Comments: Kimberly [name] is a 53 y.o. female with h/o herpes, COPD and HTN who presents to the Emergency Department complaining of severe vaginal pain that woke her up pta, with associated dysuria and frequency for the past day.  Pt states that since last night she has had frequency and burning on urination, with some associated subjective fever and chills.  However she was otherwise well tonight and did not have any pain on going to bed, but after falling asleep she was woken up by severe, 10/10, burning throbbing pain to the vaginal area.  Pain is not worsened or relieved by anything.  It does not extend to the rectum.  Currently she also feels nauseated.  She denies vaginal discharge.  Surgical history includes hysterectomy and unilateral oophorectomy.  PCP is Glo Herring., MD   Past Medical History  Diagnosis Date  . Hypertension   . COPD (chronic obstructive pulmonary disease)   . Herpes     Past Surgical History  Procedure Laterality Date  . Abdominal hysterectomy    . Rotator cuff repair Left   . Foreign body removal Left     knee-as child  . Carpal tunnel release Left 02/16/2013    Procedure: CARPAL TUNNEL RELEASE;  Surgeon: Carole Civil, MD;  Location: [facility];  Service: Orthopedics;  Laterality: Left;  . Carpal tunnel release Right 03/27/2013    Procedure: RIGHT CARPAL TUNNEL RELEASE;  Surgeon: Carole Civil, MD;  Location: [facility];  Service: Orthopedics;  Laterality: Right;    No family history on file.   History  Substance Use Topics  . Smoking status: Never Smoker   . Smokeless  tobacco: Not on file  . Alcohol Use: No    OB History   Grav Para Term Preterm Abortions TAB SAB Ect Mult Living                   Review of Systems  All other systems reviewed and are negative.     Allergies  Peanut-containing drug products; Shellfish allergy; and Codeine  Home Medications   Prior to Admission medications   Medication Sig Start Date End Date Taking? Authorizing Provider  amLODipine (NORVASC) 5 MG tablet Take 5 mg by mouth daily.    Historical Provider, MD  budesonide-formoterol (SYMBICORT) 160-4.5 MCG/ACT inhaler Inhale 2 puffs into the lungs 2 (two) times daily.    Historical Provider, MD  estradiol (ESTRACE) 1 MG tablet Take 1 mg by mouth daily.    Historical Provider, MD  gabapentin (NEURONTIN) 100 MG capsule 2 tabs every 8 hours 12/15/13   Carole Civil, MD  HYDROcodone-acetaminophen (NORCO/VICODIN) 5-325 MG per tablet Take 1 tablet by mouth every 6 (six) hours as needed for moderate pain. 12/15/13   Carole Civil, MD  ibuprofen (ADVIL,MOTRIN) 200 MG tablet Take 2 tablets (400 mg total) by mouth every 6 (six) hours as needed for moderate pain. 07/02/13   Merryl Hacker, MD  methocarbamol (ROBAXIN) 500 MG tablet Take 1 tablet (500 mg total) by mouth 3 (three)  times daily. 12/15/13   Carole Civil, MD  valACYclovir (VALTREX) 500 MG tablet Take 500 mg by mouth 2 (two) times daily as needed. For outbreaks     Historical Provider, MD   BP 160/109  Pulse 122  Temp(Src) 97.7 F (36.5 C) (Oral)  Resp 24  Ht 5\' 1"  (1.549 m)  Wt 151 lb (68.493 kg)  BMI 28.55 kg/m2  SpO2 97%  Physical Exam  Nursing note and vitals reviewed. Constitutional: She is oriented to person, place, and time. She appears well-developed and well-nourished. No distress.  Appears uncomfortable  HENT:  Head: Normocephalic and atraumatic.  Eyes: EOM are normal. Pupils are equal, round, and reactive to light.  Neck: Neck supple. No JVD present. No tracheal deviation present.   Cardiovascular: Regular rhythm and normal heart sounds.  Tachycardia present.  Exam reveals no friction rub.   No murmur heard. Pulmonary/Chest: Effort normal. No respiratory distress. She has no wheezes. She has no rales.  Abdominal: Soft. She exhibits no distension and no mass. There is tenderness.  Mild suprapubic tenderness  Musculoskeletal: Normal range of motion. She exhibits edema.  Lymphadenopathy:    She has no cervical adenopathy.  Neurological: She is alert and oriented to person, place, and time. She has normal reflexes. No cranial nerve deficit. Coordination abnormal.  Skin: Skin is warm and dry. No rash noted.  Psychiatric: She has a normal mood and affect. Her behavior is normal. Thought content normal.    ED Course  Procedures (including critical care time)  DIAGNOSTIC STUDIES: Oxygen Saturation is 97% on room air, normal by my interpretation.    COORDINATION OF CARE: 12:38 AM-Discussed treatment plan which includes pain medication, UA, bloodwork, and vaginal exam once pt is undressed with pt at bedside and pt agreed to plan.     Labs Review Results for orders placed during the hospital encounter of 12/18/13  WET PREP, GENITAL      Result Value Ref Range   Yeast Wet Prep HPF POC MODERATE (*) NONE SEEN   Trich, Wet Prep NONE SEEN  NONE SEEN   Clue Cells Wet Prep HPF POC FEW (*) NONE SEEN   WBC, Wet Prep HPF POC NONE SEEN  NONE SEEN  CBC WITH DIFFERENTIAL      Result Value Ref Range   WBC 9.4  4.0 - 10.5 K/uL   RBC 4.46  3.87 - 5.11 MIL/uL   Hemoglobin 13.5  12.0 - 15.0 g/dL   HCT 41.1  36.0 - 46.0 %   MCV 92.2  78.0 - 100.0 fL   MCH 30.3  26.0 - 34.0 pg   MCHC 32.8  30.0 - 36.0 g/dL   RDW 12.0  11.5 - 15.5 %   Platelets 276  150 - 400 K/uL   Neutrophils Relative % 51  43 - 77 %   Neutro Abs 4.9  1.7 - 7.7 K/uL   Lymphocytes Relative 37  12 - 46 %   Lymphs Abs 3.4  0.7 - 4.0 K/uL   Monocytes Relative 9  3 - 12 %   Monocytes Absolute 0.9  0.1 - 1.0 K/uL    Eosinophils Relative 2  0 - 5 %   Eosinophils Absolute 0.2  0.0 - 0.7 K/uL   Basophils Relative 1  0 - 1 %   Basophils Absolute 0.1  0.0 - 0.1 K/uL  BASIC METABOLIC PANEL      Result Value Ref Range   Sodium 143  137 - 147 mEq/L  Potassium 3.9  3.7 - 5.3 mEq/L   Chloride 100  96 - 112 mEq/L   CO2 29  19 - 32 mEq/L   Glucose, Bld 109 (*) 70 - 99 mg/dL   BUN 8  6 - 23 mg/dL   Creatinine, Ser 0.65  0.50 - 1.10 mg/dL   Calcium 9.6  8.4 - 10.5 mg/dL   GFR calc non Af Amer >90  >90 mL/min   GFR calc Af Amer >90  >90 mL/min  URINALYSIS, ROUTINE W REFLEX MICROSCOPIC      Result Value Ref Range   Color, Urine STRAW (*) YELLOW   APPearance CLEAR  CLEAR   Specific Gravity, Urine 1.015  1.005 - 1.030   pH 7.0  5.0 - 8.0   Glucose, UA NEGATIVE  NEGATIVE mg/dL   Hgb urine dipstick SMALL (*) NEGATIVE   Bilirubin Urine NEGATIVE  NEGATIVE   Ketones, ur NEGATIVE  NEGATIVE mg/dL   Protein, ur NEGATIVE  NEGATIVE mg/dL   Urobilinogen, UA 0.2  0.0 - 1.0 mg/dL   Nitrite NEGATIVE  NEGATIVE   Leukocytes, UA NEGATIVE  NEGATIVE  URINE MICROSCOPIC-ADD ON      Result Value Ref Range   Squamous Epithelial / LPF RARE  RARE   RBC / HPF 3-6  <3 RBC/hpf   Bacteria, UA RARE  RARE    MDM   Final diagnoses:  Vaginal pain  Monilial vaginitis    Vaginal pain of uncertain cause. Patient is clearly too uncomfortable to have pelvic exam couldn't this time. She's given morphine with slight relief of pain and is given initial dose of morphine with better relief of pain. Pelvic examination is done showing atrophic mucosa and a small amount of whitish discharge. Cervix is surgically absent. There is moderate tenderness on bimanual examination. His fundus is absent. No adnexal masses or tenderness. Urinalysis has come back normal. Cause of her pain is unclear he. Anticipate treating her symptomatically and referring her to gynecology for further evaluation.  Wet prep shows presence of yeast. I doubt this is  actually the cause of her pain but will need to be treated. She got good relief of pain with doses of morphine. She is discharged with prescription for oxycodone and acetaminophen and also fluconazole. She is referred to gynecology for further evaluation.  I personally performed the services described in this documentation, which was scribed in my presence. The recorded information has been reviewed and is accurate.     Delora Fuel, MD 50/38/88 [id number]

## 2013-12-19 LAB — GC/CHLAMYDIA PROBE AMP
CT Probe RNA: NEGATIVE
GC PROBE AMP APTIMA: NEGATIVE

## 2013-12-23 MED FILL — Oxycodone w/ Acetaminophen Tab 5-325 MG: ORAL | Qty: 6 | Status: AC

## 2013-12-25 ENCOUNTER — Ambulatory Visit (HOSPITAL_COMMUNITY)
Admission: RE | Admit: 2013-12-25 | Discharge: 2013-12-25 | Disposition: A | Discharge status: Home / Self Care | Payer: 59 | Source: Ambulatory Visit | Attending: Orthopedic Surgery | Admitting: Orthopedic Surgery

## 2013-12-25 ENCOUNTER — Other Ambulatory Visit (HOSPITAL_COMMUNITY): Payer: Self-pay | Admitting: Internal Medicine

## 2013-12-25 DIAGNOSIS — Z1231 Encounter for screening mammogram for malignant neoplasm of breast: Secondary | ICD-10-CM

## 2013-12-25 DIAGNOSIS — M25519 Pain in unspecified shoulder: Secondary | ICD-10-CM | POA: Insufficient documentation

## 2013-12-25 DIAGNOSIS — M5380 Other specified dorsopathies, site unspecified: Secondary | ICD-10-CM | POA: Insufficient documentation

## 2013-12-25 DIAGNOSIS — M542 Cervicalgia: Secondary | ICD-10-CM | POA: Insufficient documentation

## 2013-12-25 DIAGNOSIS — M4802 Spinal stenosis, cervical region: Secondary | ICD-10-CM | POA: Insufficient documentation

## 2013-12-25 DIAGNOSIS — M47812 Spondylosis without myelopathy or radiculopathy, cervical region: Secondary | ICD-10-CM | POA: Insufficient documentation

## 2014-01-01 ENCOUNTER — Ambulatory Visit (HOSPITAL_COMMUNITY)
Admission: RE | Admit: 2014-01-01 | Discharge: 2014-01-01 | Disposition: A | Discharge status: Home / Self Care | Payer: 59 | Source: Ambulatory Visit | Attending: Internal Medicine | Admitting: Internal Medicine

## 2014-01-01 DIAGNOSIS — Z1231 Encounter for screening mammogram for malignant neoplasm of breast: Secondary | ICD-10-CM | POA: Insufficient documentation

## 2014-01-05 ENCOUNTER — Ambulatory Visit (INDEPENDENT_AMBULATORY_CARE_PROVIDER_SITE_OTHER): Payer: 59 | Admitting: Orthopedic Surgery

## 2014-01-05 ENCOUNTER — Encounter: Payer: Self-pay | Admitting: Orthopedic Surgery

## 2014-01-05 VITALS — Ht 61.0 in | Wt 151.0 lb

## 2014-01-05 DIAGNOSIS — M47812 Spondylosis without myelopathy or radiculopathy, cervical region: Secondary | ICD-10-CM

## 2014-01-05 MED ORDER — GABAPENTIN 300 MG PO CAPS: 300.0000 mg | ORAL_CAPSULE | Freq: Three times a day (TID) | ORAL | Status: DC

## 2014-01-05 MED ORDER — METHOCARBAMOL 500 MG PO TABS: 500.0000 mg | ORAL_TABLET | Freq: Three times a day (TID) | ORAL | Status: DC

## 2014-01-05 MED ORDER — HYDROCODONE-ACETAMINOPHEN 5-325 MG PO TABS: 1.0000 | ORAL_TABLET | Freq: Four times a day (QID) | ORAL | Status: DC | PRN

## 2014-01-05 NOTE — Patient Instructions (Addendum)
Make neurosurgical referral for cervical spondylosis to [facility]

## 2014-01-05 NOTE — Progress Notes (Signed)
Patient ID: Kimberly [name], female   DOB: [birth year], 53 y.o.   MRN: [id number] Chief Complaint  Patient presents with  . Results    MRI Results C-Spine    53 year old female status post bilateral carpal tunnel releases presents for reevaluation of her cervical spine after MRI. Previously we noted that she experienced persistent neck pain and bilateral radicular symptoms which started in March of 2015 without trauma. She was treated with ibuprofen, hydrocodone, Robaxin and gabapentin as well as physical therapy her pain remained 8/10. Physical findings included tenderness in C6-C7 right trapezius left trapezius with weakness in the biceps tendon and abnormal reflex and sensation in the right upper extremity with x-ray showing spondylosis of C5-7 including C4 and 5 as well.  Her medications were Norco, Neurontin and methocarbamol  MRI showed IMPRESSION: Moderate multilevel cervical spondylosis, detailed above. There is potential for bilateral nerve root encroachment at multiple levels in this patient with bilateral upper extremity radicular symptoms. Central stenosis is most pronounced at C3-C4 and C6-C7.  I have reviewed this MRI scan and agree with the findings as stated.   The patient will be referred to neurosurgery for evaluation and treatment

## 2014-01-06 ENCOUNTER — Other Ambulatory Visit: Payer: Self-pay | Admitting: *Deleted

## 2014-01-06 ENCOUNTER — Telehealth: Payer: Self-pay | Admitting: *Deleted

## 2014-01-06 DIAGNOSIS — M47812 Spondylosis without myelopathy or radiculopathy, cervical region: Secondary | ICD-10-CM

## 2014-01-06 NOTE — Telephone Encounter (Signed)
Faxed office notes and referral to [facility]. Awaiting appointment. 

## 2014-01-11 ENCOUNTER — Other Ambulatory Visit (HOSPITAL_COMMUNITY): Payer: Self-pay | Admitting: Internal Medicine

## 2014-01-11 DIAGNOSIS — N949 Unspecified condition associated with female genital organs and menstrual cycle: Secondary | ICD-10-CM

## 2014-01-14 ENCOUNTER — Ambulatory Visit (HOSPITAL_COMMUNITY)
Admission: RE | Admit: 2014-01-14 | Discharge: 2014-01-14 | Disposition: A | Discharge status: Home / Self Care | Payer: 59 | Source: Ambulatory Visit | Attending: Internal Medicine | Admitting: Internal Medicine

## 2014-01-14 ENCOUNTER — Other Ambulatory Visit (HOSPITAL_COMMUNITY): Payer: Self-pay | Admitting: Internal Medicine

## 2014-01-14 DIAGNOSIS — N83209 Unspecified ovarian cyst, unspecified side: Secondary | ICD-10-CM | POA: Insufficient documentation

## 2014-01-14 DIAGNOSIS — N949 Unspecified condition associated with female genital organs and menstrual cycle: Secondary | ICD-10-CM

## 2014-01-18 NOTE — Telephone Encounter (Signed)
Patient has an appointment with Dr. Joya Salm 01/25/14 at 10:00 am. Patient aware.

## 2014-01-19 ENCOUNTER — Encounter: Payer: Self-pay | Admitting: *Deleted

## 2014-01-26 ENCOUNTER — Other Ambulatory Visit (HOSPITAL_COMMUNITY): Payer: Self-pay

## 2014-01-26 DIAGNOSIS — J441 Chronic obstructive pulmonary disease with (acute) exacerbation: Secondary | ICD-10-CM

## 2014-01-29 ENCOUNTER — Other Ambulatory Visit: Payer: Self-pay | Admitting: Pulmonary Disease

## 2014-01-29 ENCOUNTER — Ambulatory Visit (HOSPITAL_COMMUNITY)
Admission: RE | Admit: 2014-01-29 | Discharge: 2014-01-29 | Disposition: A | Discharge status: Home / Self Care | Payer: 59 | Source: Ambulatory Visit | Attending: Pulmonary Disease | Admitting: Pulmonary Disease

## 2014-01-29 DIAGNOSIS — J441 Chronic obstructive pulmonary disease with (acute) exacerbation: Secondary | ICD-10-CM

## 2014-01-29 DIAGNOSIS — R059 Cough, unspecified: Secondary | ICD-10-CM | POA: Insufficient documentation

## 2014-01-29 DIAGNOSIS — R05 Cough: Secondary | ICD-10-CM | POA: Insufficient documentation

## 2014-02-01 ENCOUNTER — Encounter: Payer: Self-pay | Admitting: Adult Health

## 2014-02-12 ENCOUNTER — Other Ambulatory Visit: Payer: Self-pay | Admitting: Neurosurgery

## 2014-02-12 DIAGNOSIS — M5416 Radiculopathy, lumbar region: Secondary | ICD-10-CM

## 2014-02-12 DIAGNOSIS — M5412 Radiculopathy, cervical region: Secondary | ICD-10-CM

## 2014-02-15 ENCOUNTER — Ambulatory Visit (HOSPITAL_COMMUNITY)
Admission: RE | Admit: 2014-02-15 | Discharge: 2014-02-15 | Disposition: A | Discharge status: Home / Self Care | Payer: 59 | Source: Ambulatory Visit | Attending: Pulmonary Disease | Admitting: Pulmonary Disease

## 2014-02-15 DIAGNOSIS — J441 Chronic obstructive pulmonary disease with (acute) exacerbation: Secondary | ICD-10-CM | POA: Insufficient documentation

## 2014-02-15 MED ORDER — ALBUTEROL SULFATE (2.5 MG/3ML) 0.083% IN NEBU
2.5000 mg | INHALATION_SOLUTION | Freq: Once | RESPIRATORY_TRACT | Status: AC
  Administered 2014-02-15: 2.5 mg via RESPIRATORY_TRACT

## 2014-02-17 ENCOUNTER — Ambulatory Visit
Admission: RE | Admit: 2014-02-17 | Discharge: 2014-02-17 | Disposition: A | Discharge status: Home / Self Care | Payer: 59 | Source: Ambulatory Visit | Attending: Neurosurgery | Admitting: Neurosurgery

## 2014-02-17 VITALS — BP 110/77 | HR 82

## 2014-02-17 DIAGNOSIS — M5416 Radiculopathy, lumbar region: Secondary | ICD-10-CM

## 2014-02-17 DIAGNOSIS — M5412 Radiculopathy, cervical region: Secondary | ICD-10-CM

## 2014-02-17 DIAGNOSIS — M47812 Spondylosis without myelopathy or radiculopathy, cervical region: Secondary | ICD-10-CM

## 2014-02-17 LAB — PULMONARY FUNCTION TEST
DL/VA % PRED: 105 %
DL/VA: 4.66 ml/min/mmHg/L
DLCO COR % PRED: 66 %
DLCO COR: 13.35 ml/min/mmHg
DLCO UNC % PRED: 66 %
DLCO UNC: 13.35 ml/min/mmHg
FEF 25-75 POST: 2.87 L/s
FEF 25-75 Pre: 1.91 L/sec
FEF2575-%Change-Post: 50 %
FEF2575-%PRED-POST: 134 %
FEF2575-%PRED-PRE: 89 %
FEV1-%Change-Post: 8 %
FEV1-%PRED-PRE: 85 %
FEV1-%Pred-Post: 92 %
FEV1-POST: 1.86 L
FEV1-Pre: 1.71 L
FEV1FVC-%CHANGE-POST: 5 %
FEV1FVC-%Pred-Pre: 103 %
FEV6-%Change-Post: 2 %
FEV6-%PRED-POST: 86 %
FEV6-%PRED-PRE: 83 %
FEV6-PRE: 2.03 L
FEV6-Post: 2.08 L
FEV6FVC-%PRED-POST: 103 %
FEV6FVC-%Pred-Pre: 103 %
FVC-%CHANGE-POST: 2 %
FVC-%Pred-Post: 83 %
FVC-%Pred-Pre: 81 %
FVC-PRE: 2.03 L
FVC-Post: 2.08 L
POST FEV6/FVC RATIO: 100 %
PRE FEV1/FVC RATIO: 84 %
Post FEV1/FVC ratio: 89 %
Pre FEV6/FVC Ratio: 100 %
RV % pred: 84 %
RV: 1.43 L
TLC % pred: 75 %
TLC: 3.46 L

## 2014-02-17 MED ORDER — DIAZEPAM 5 MG PO TABS
10.0000 mg | ORAL_TABLET | Freq: Once | ORAL | Status: AC
Start: 2014-02-17 — End: 2014-02-17
  Administered 2014-02-17: 10 mg via ORAL

## 2014-02-17 MED ORDER — ONDANSETRON HCL 4 MG/2ML IJ SOLN
4.0000 mg | Freq: Once | INTRAMUSCULAR | Status: AC
  Administered 2014-02-17: 4 mg via INTRAMUSCULAR

## 2014-02-17 MED ORDER — IOHEXOL 300 MG/ML  SOLN
10.0000 mL | Freq: Once | INTRAMUSCULAR | Status: AC | PRN
  Administered 2014-02-17: 10 mL via INTRATHECAL

## 2014-02-17 MED ORDER — HYDROMORPHONE HCL PF 2 MG/ML IJ SOLN
1.5000 mg | Freq: Once | INTRAMUSCULAR | Status: AC
  Administered 2014-02-17: 1.5 mg via INTRAMUSCULAR

## 2014-02-17 NOTE — Discharge Instructions (Signed)

## 2014-02-27 IMAGING — CR DG CHEST 2V
2 series · 2 of 2 positions shown · non-contrast
Comparison: 07/02/2011

CLINICAL DATA: Left chest tightness.  COPD.  Cough.  Shortness of
breath.

CHEST - 2 VIEW

[view not recorded (1 of 2)]
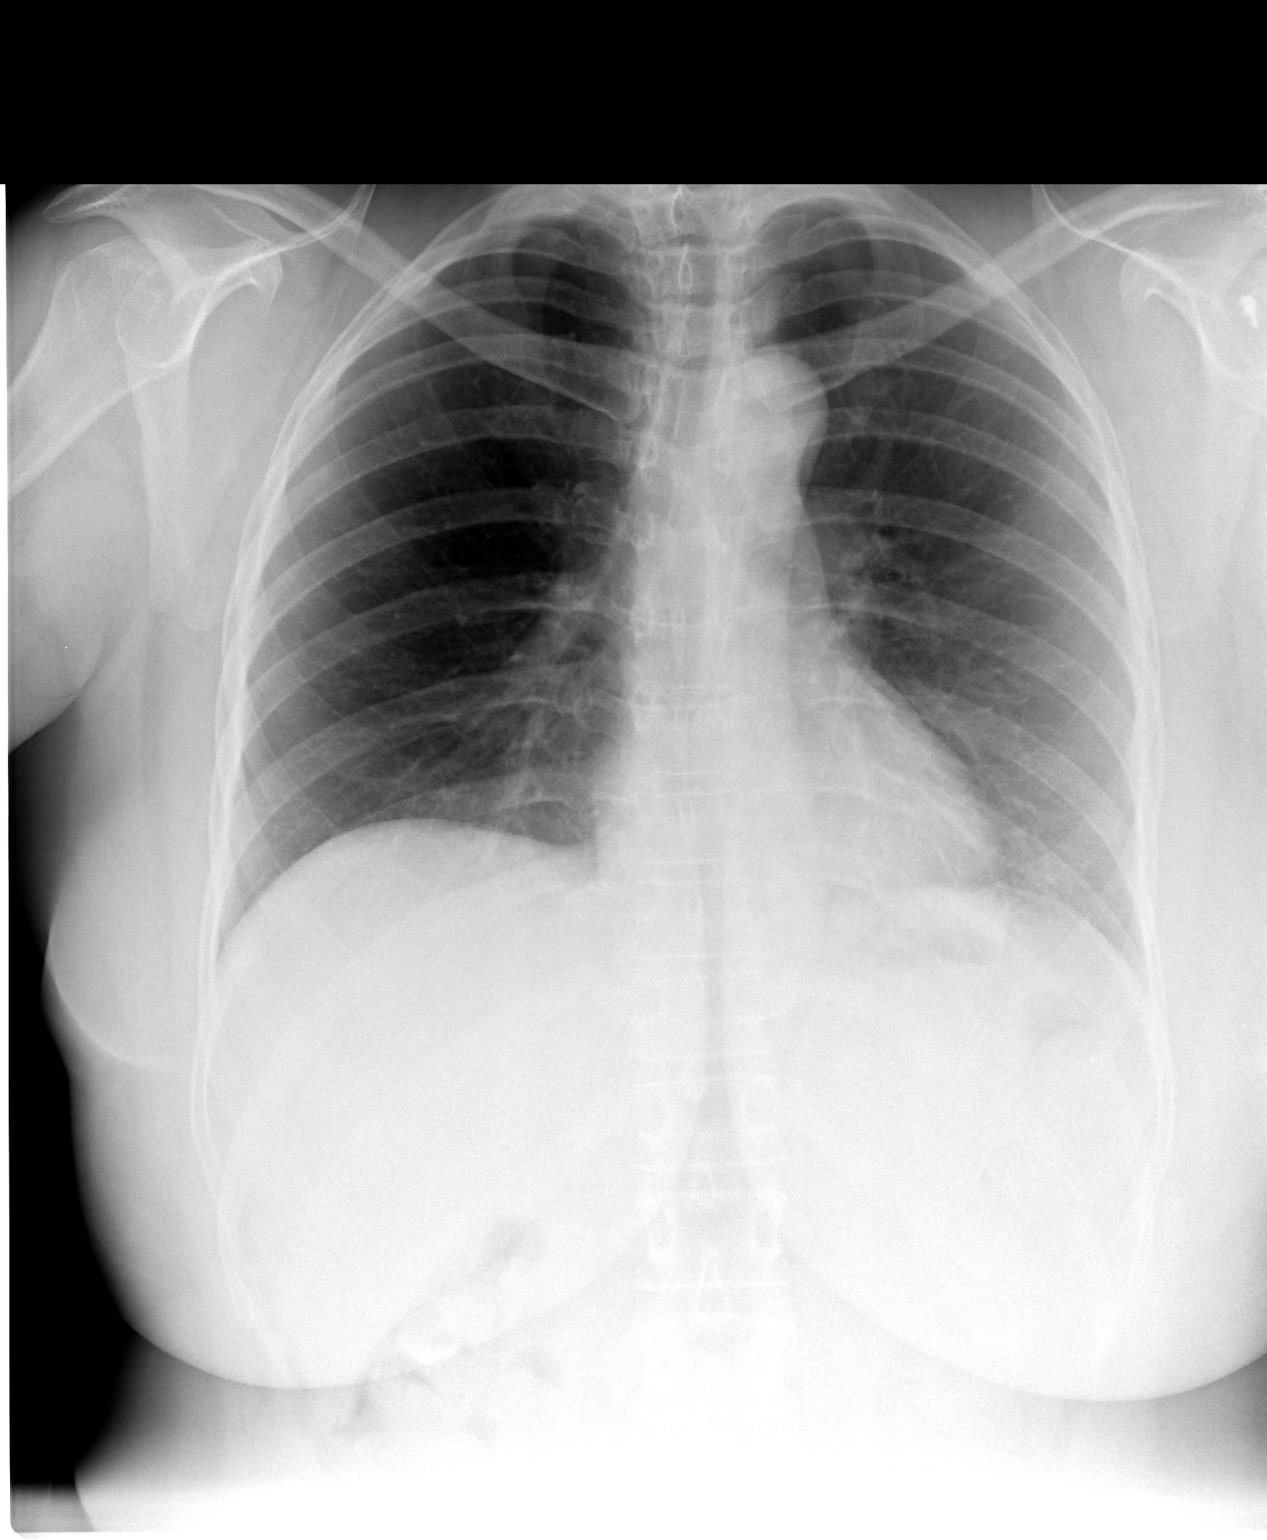

[view not recorded (2 of 2)]
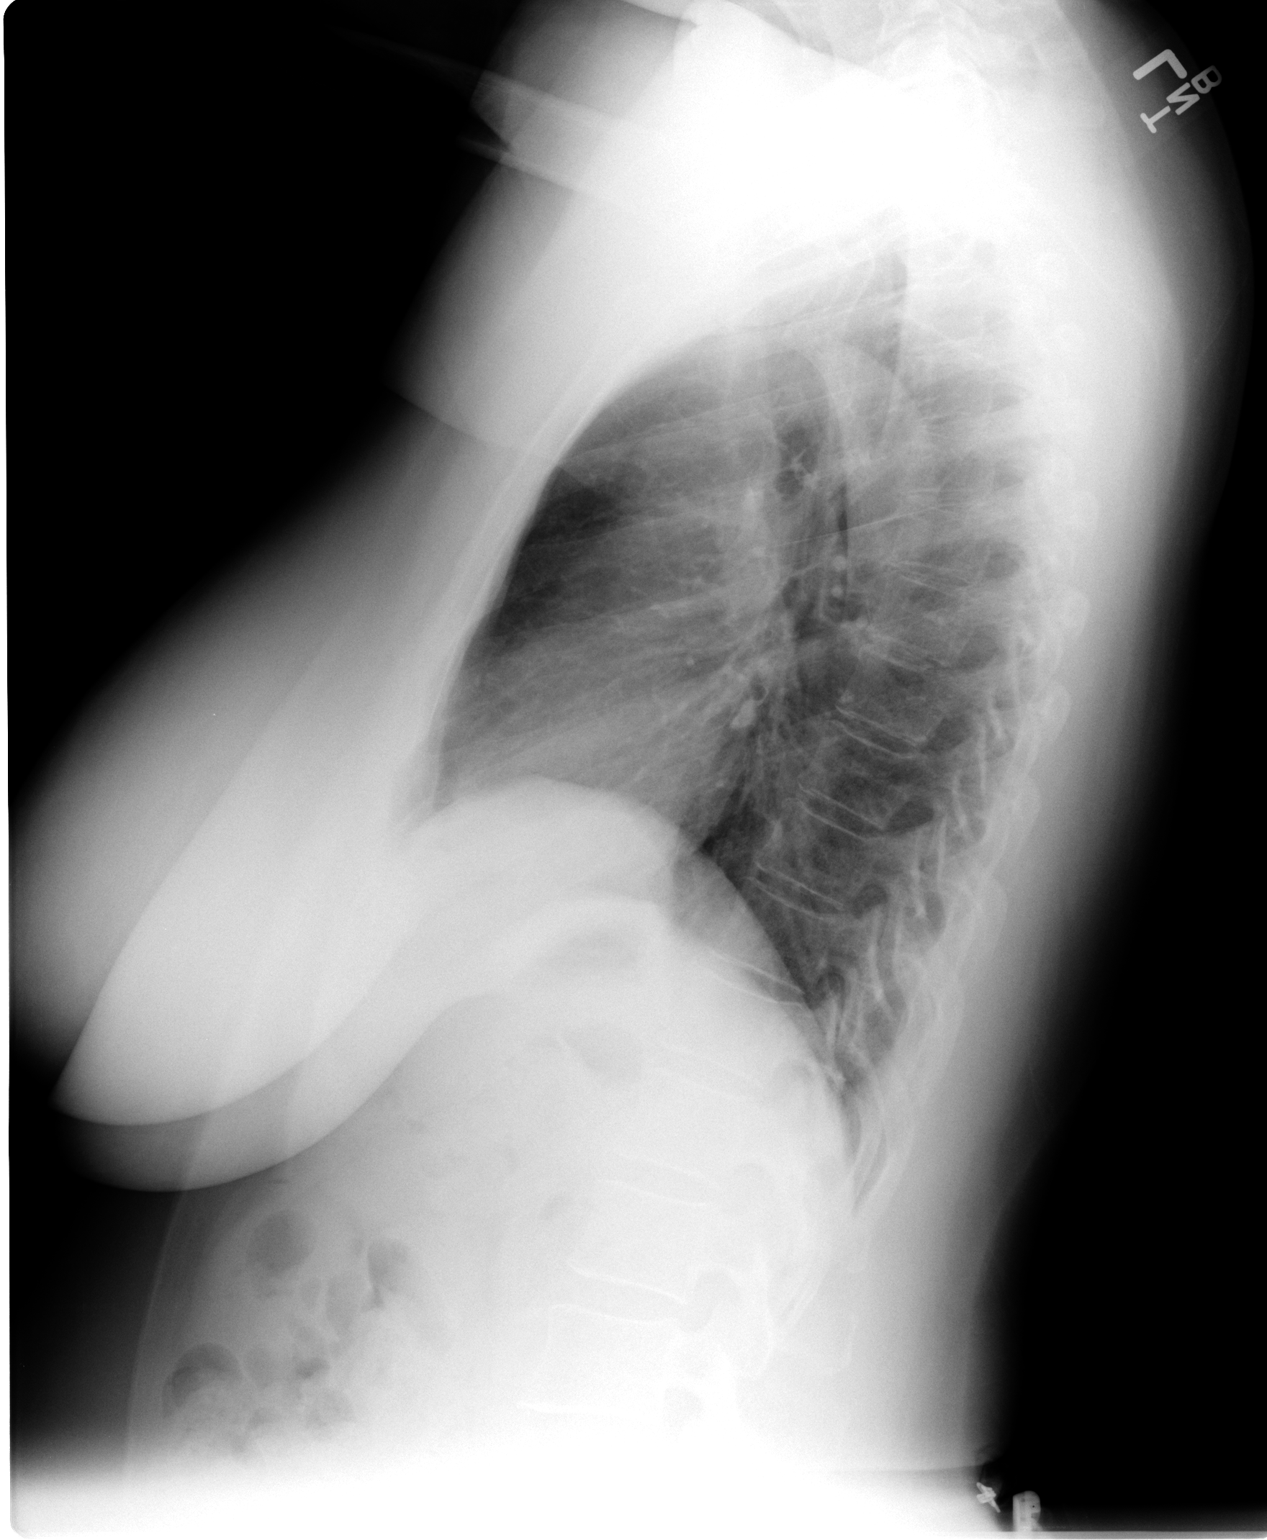

[2 of 2 positions shown; findings below may reference images not displayed]

FINDINGS: There has been clearing of the opacity at the right lung
base.  However, there is indistinct retrocardiac airspace opacity
which could reflect atelectasis, pneumonia, or scarring.

Cardiac and mediastinal contours appear unremarkable.

No pleural effusion is identified.
IMPRESSION: 1.  Faint left retrocardiac opacity may reflect scarring,
atelectasis, or early pneumonia.

## 2014-03-20 IMAGING — CT CT ABD-PEL WO/W CM
3 of 10 series · 13 of 46 positions shown, 19 images · IV contrast (Omnipaque 300)
Comparison: 12/17/2009

CLINICAL DATA: Micro hematuria

CT ABDOMEN AND PELVIS WITHOUT AND WITH CONTRAST
TECHNIQUE: Multidetector CT imaging of the abdomen and pelvis was
performed without contrast material in one or both body regions,
followed by contrast material(s) and further sections in one or
both body regions.
Contrast: 125mL OMNIPAQUE IOHEXOL 300 MG/ML  SOLN

[Series 3: hematuria pre 5.0 b40f · axial · non-contrast · 0.70mm/px · z∈[-414,-104]mm · 6 of 88 slices shown, 11 images]
[im 13/88  soft-tissue]
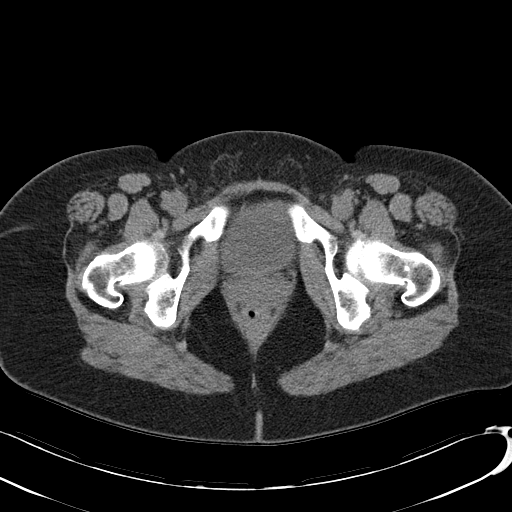
[im 13/88  bone]
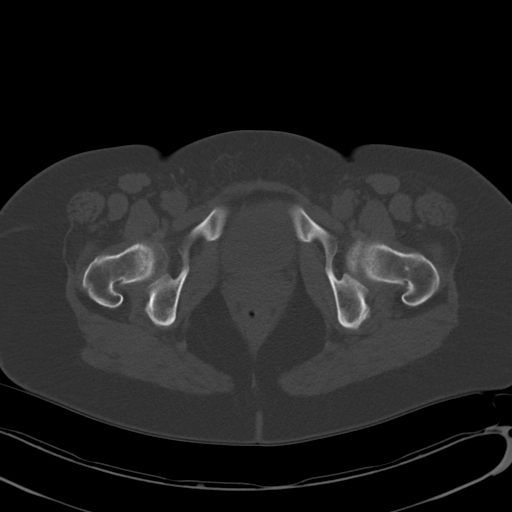
[im 25/88  soft-tissue]
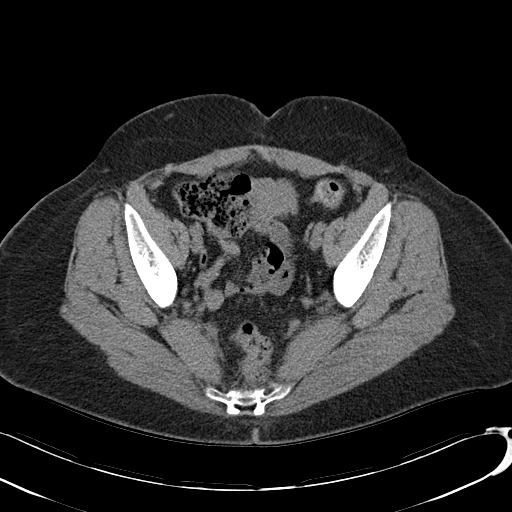
[im 38/88  soft-tissue]
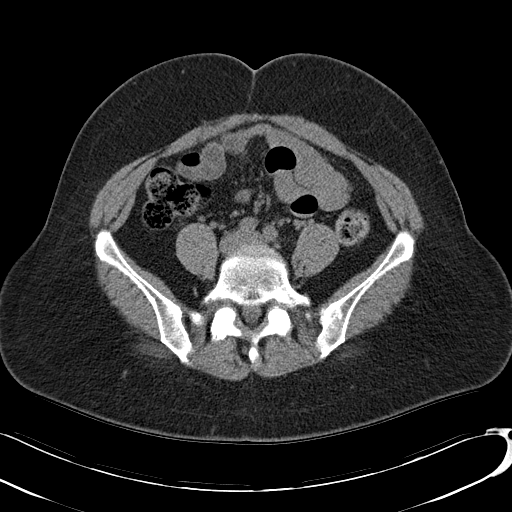
[im 38/88  lung]
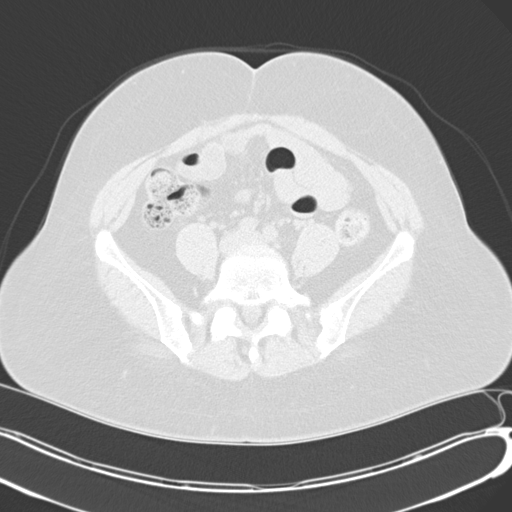
[im 50/88  soft-tissue]
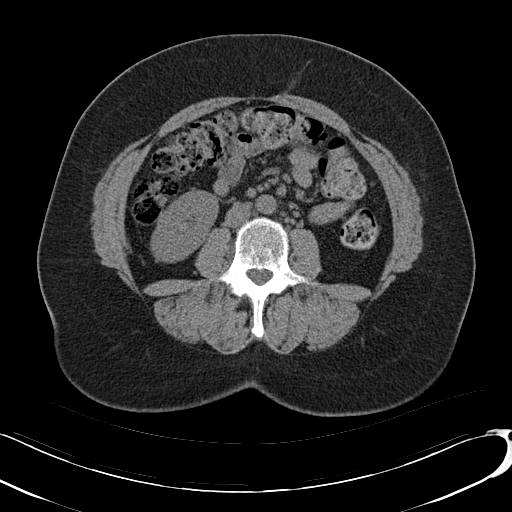
[im 50/88  lung]
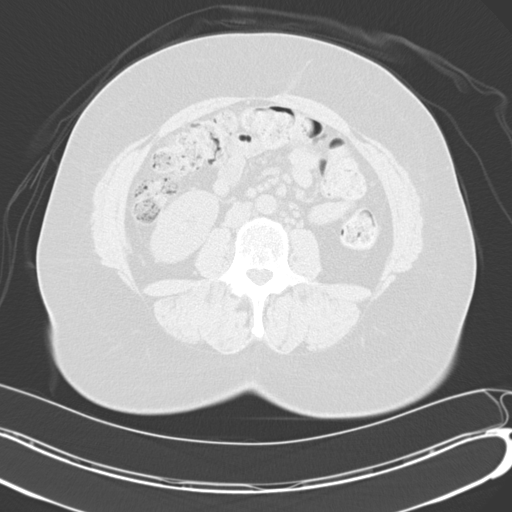
[im 63/88  soft-tissue]
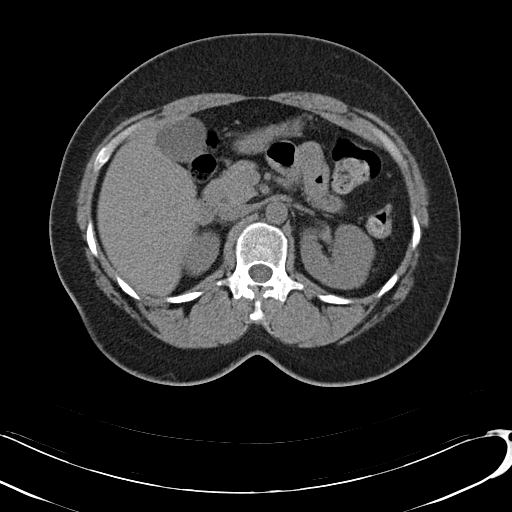
[im 63/88  lung]
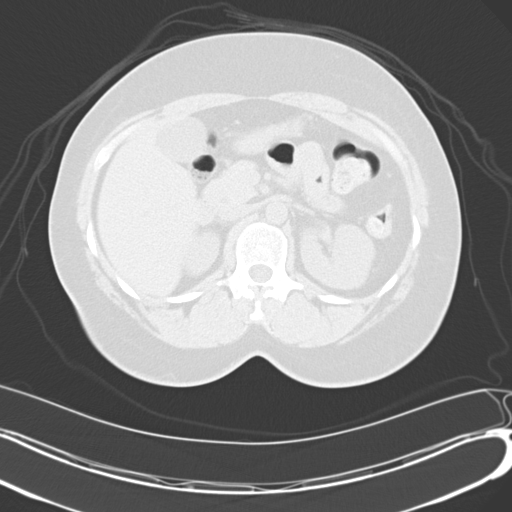
[im 75/88  soft-tissue]
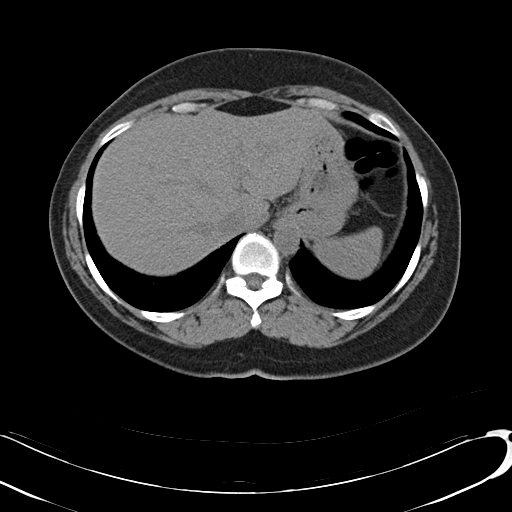
[im 75/88  lung]
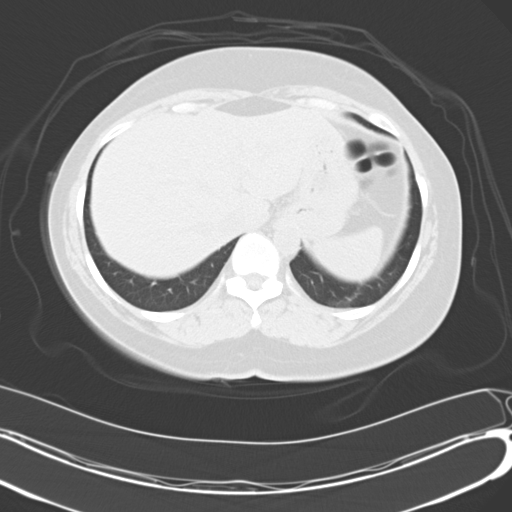

[Series 4: hematuria mpr pre coronal 3.0 · coronal · non-contrast · 0.82mm/px · 2 of 85 slices shown, 3 images]
[im 29/85  soft-tissue]
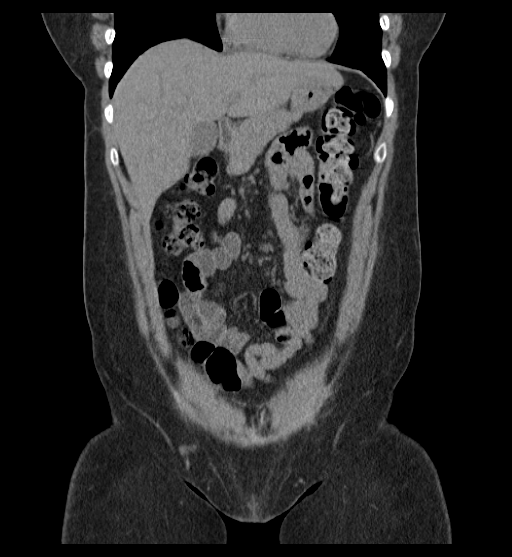
[im 29/85  bone]
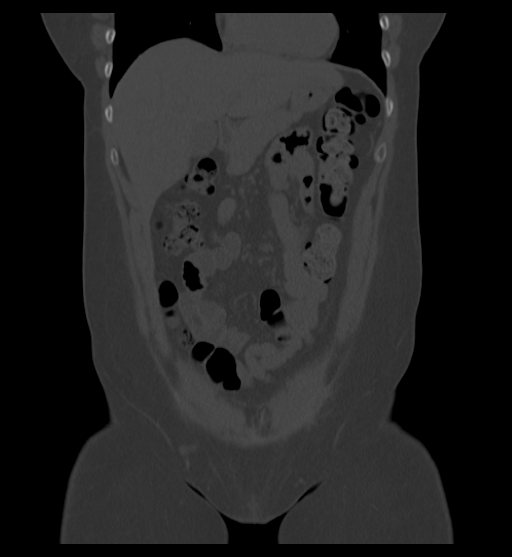
[im 57/85  soft-tissue]
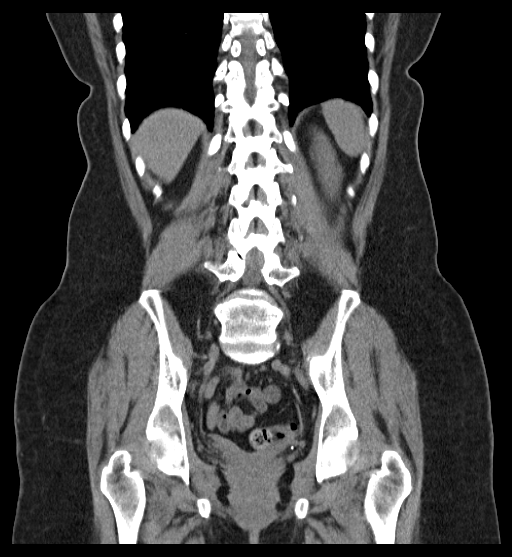

[Series 7: hematuria post axial 5.0 b40f · axial · 0.70mm/px · z∈[-414,-164]mm · 5 of 88 slices shown]
[im 13/88  soft-tissue]
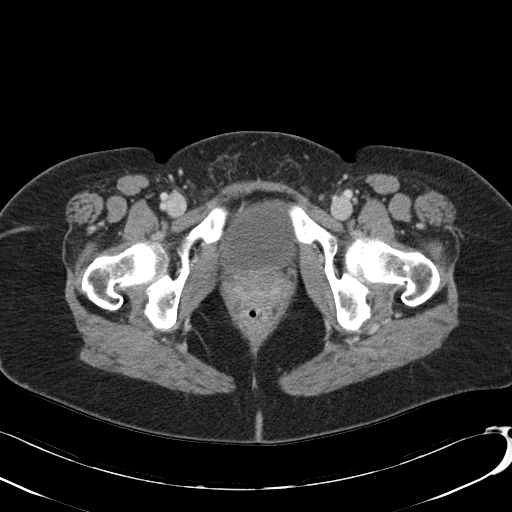
[im 25/88  soft-tissue]
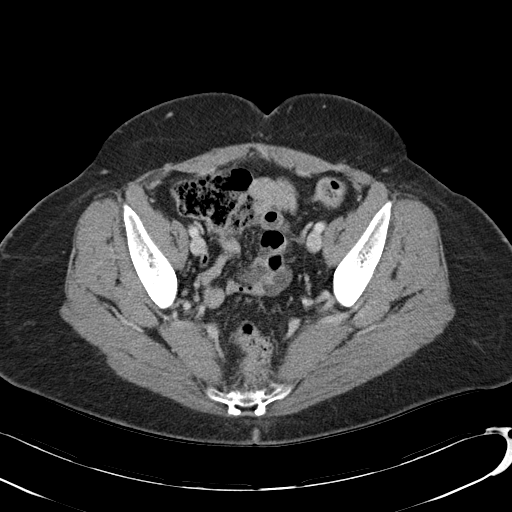
[im 38/88  soft-tissue]
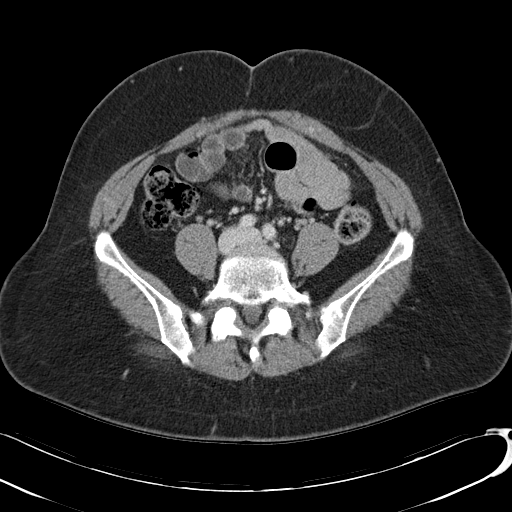
[im 50/88  soft-tissue]
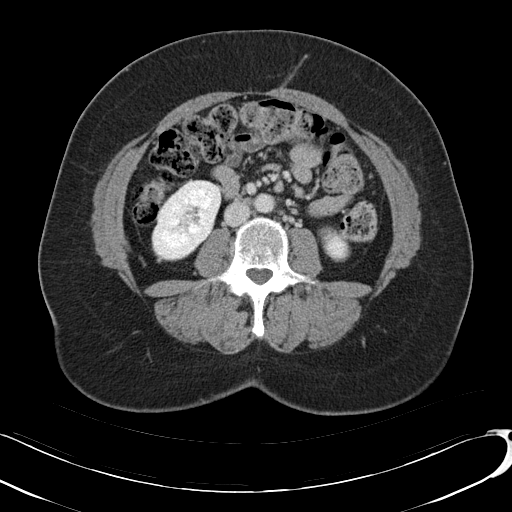
[im 63/88  soft-tissue]
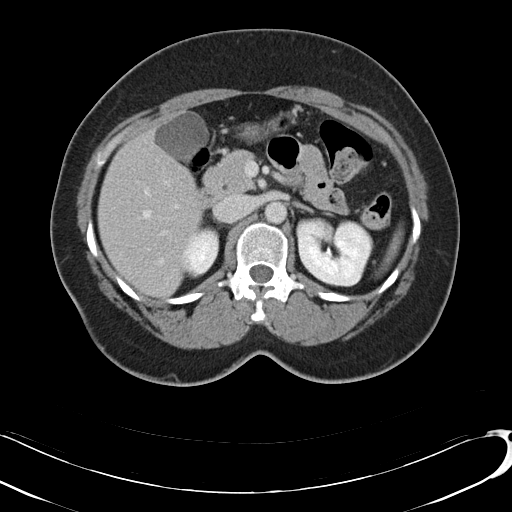

[13 of 46 positions shown; findings below may reference images not displayed]

FINDINGS: The lung bases are clear.

No pericardial or pleural effusion.

There is no focal liver abnormality.

The gallbladder appears normal.

No biliary dilatation.

The pancreas appears normal.

The spleen is normal.

The adrenal glands are both normal.

There is a tiny stone within the lower pole the left kidney
measuring 2-3 mm, image 33.  No right renal calculi identified.

No hydronephrosis or hydroureter. Symmetric excretion of contrast
material on the delayed images.  Urinary bladder appears normal.

Prior hysterectomy.

Left ovarian cyst measures 3.0 x 4.9 cm, image 68.  Previously this
measured 2.9 x 4.6 cm.

The stomach and the small bowel loops are normal.

The colon appears within normal limits.

Review of the visualized bony structures is significant for mild
spondylosis.
IMPRESSION: 1.  Tiny nonobstructing calculus is present within the inferior
pole of the left kidney.

2.  Persistent left ovarian cyst compared with 12/17/2009. This
would be better assessed with pelvic sonogram.

## 2014-04-28 IMAGING — CR DG CHEST 2V
2 series · 2 of 2 positions shown · non-contrast
Comparison: Chest x-ray of 10/11/2036

CLINICAL DATA: History of pneumonia, follow-up, shortness of breath

CHEST - 2 VIEW

[view not recorded (1 of 2)]
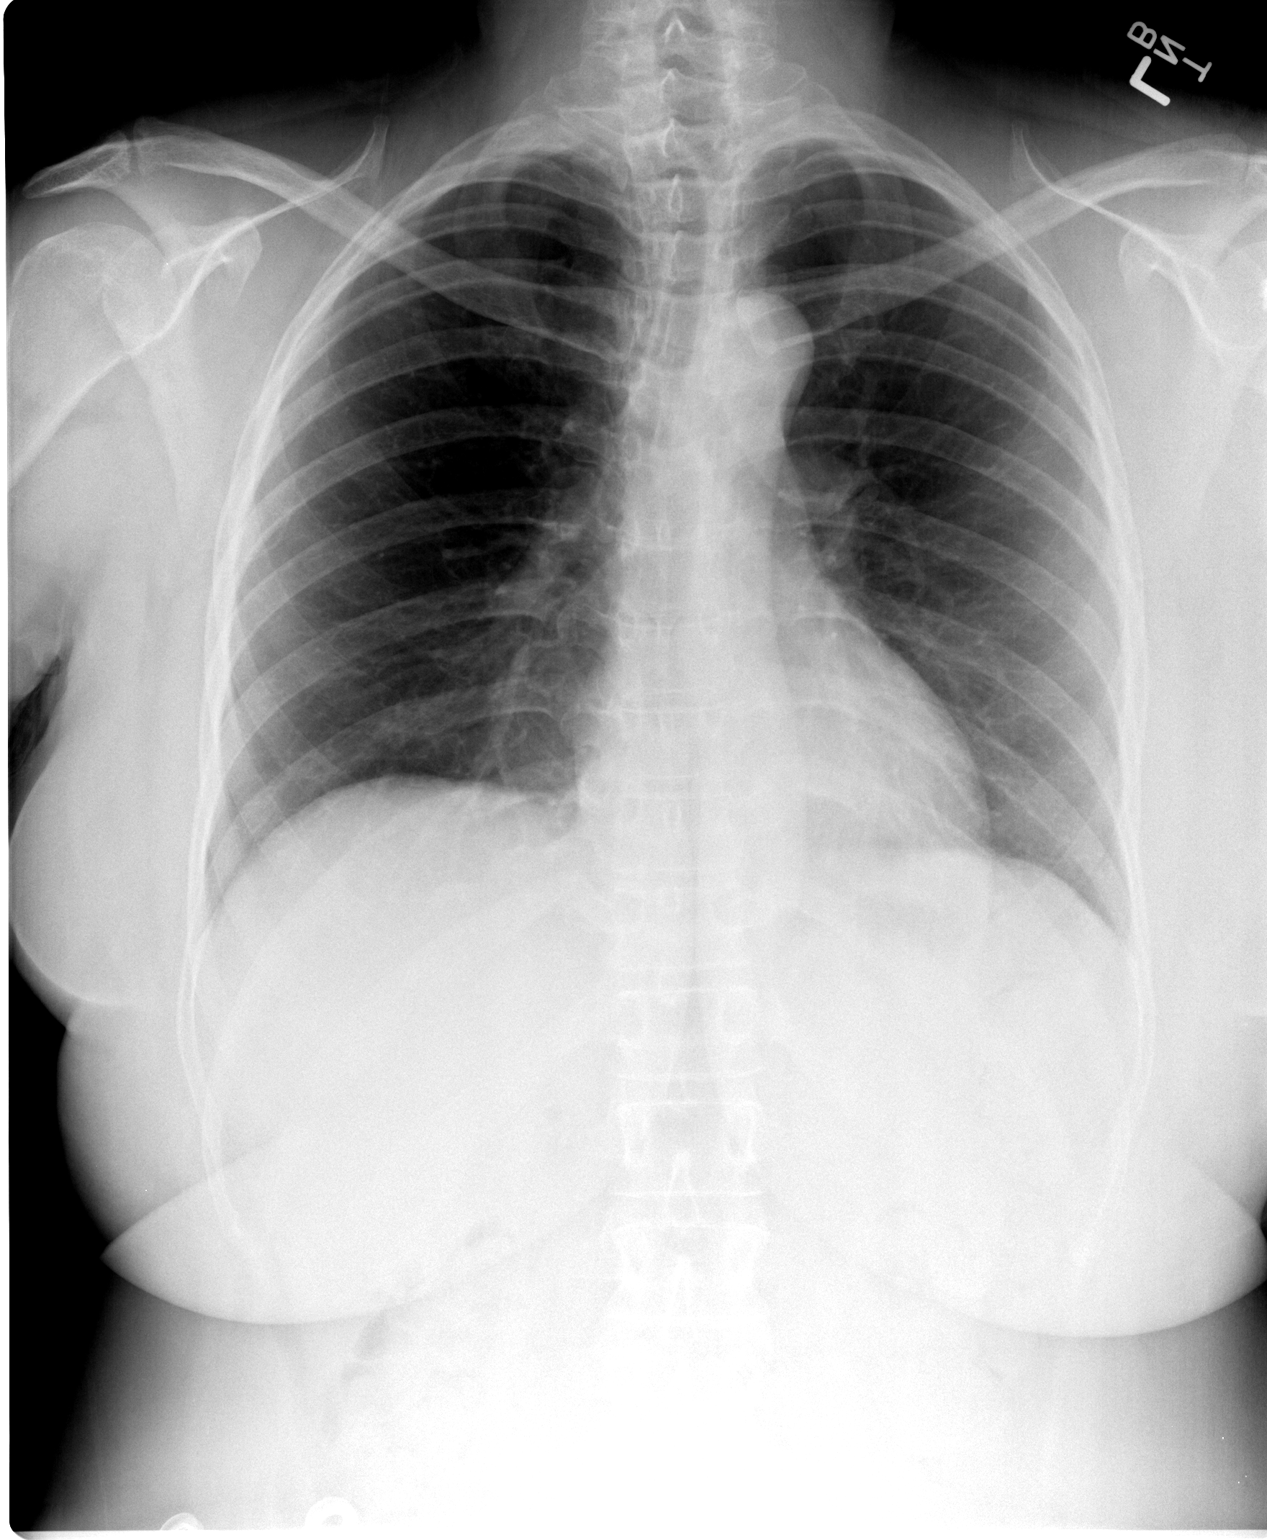

[view not recorded (2 of 2)]
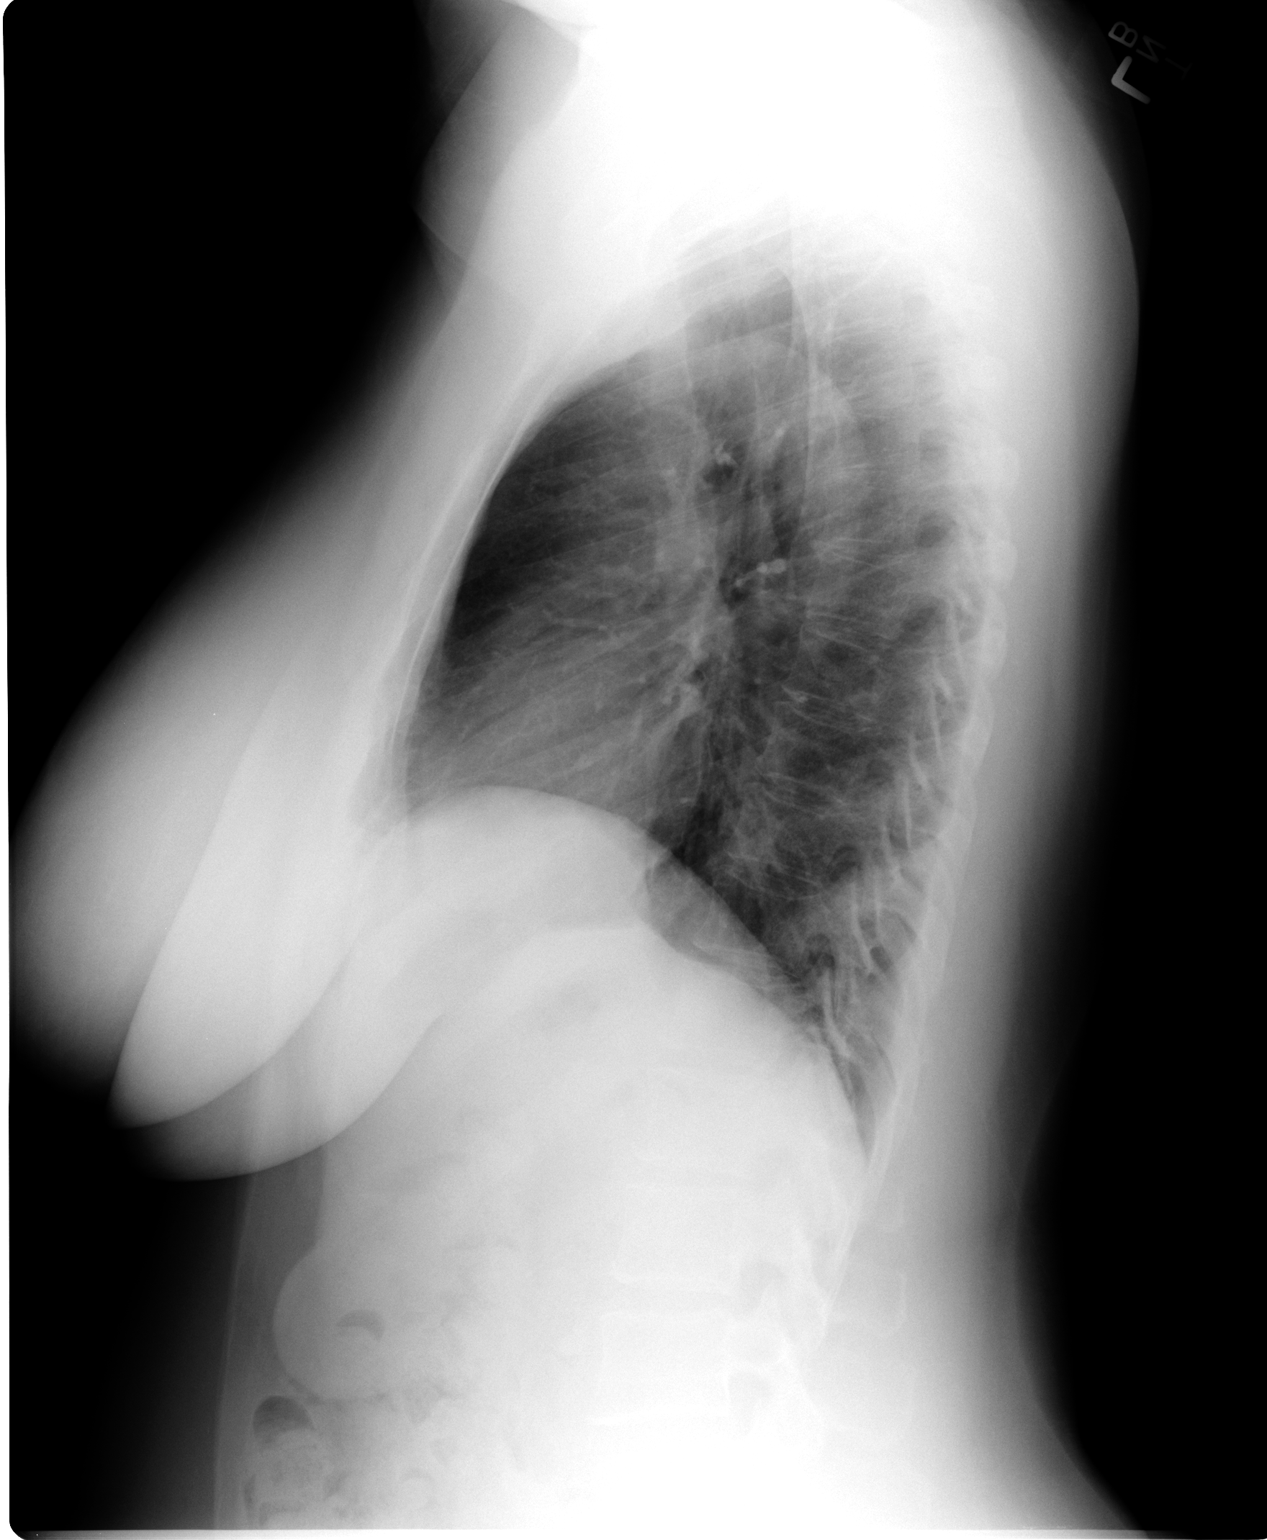

[2 of 2 positions shown; findings below may reference images not displayed]

FINDINGS: Minimally prominent markings remain overlying the heart
shadow at the left lung base medially.  This may represent scarring
in view of the lack of interval change.  Otherwise the lungs are
clear.  No effusion is seen.  The heart is within normal limits in
size.  No bony abnormality is noted.
IMPRESSION: Minimally prominent markings remain at the left lung base medially
which may represent scarring in view of the lack of change.

## 2014-05-06 IMAGING — CR DG CHEST 2V
2 series · 2 of 2 positions shown · non-contrast
Comparison: 12/11/2011.

CLINICAL DATA: Unsteady gait for 2 days.  Congestion.

CHEST - 2 VIEW

[view not recorded (1 of 2)]
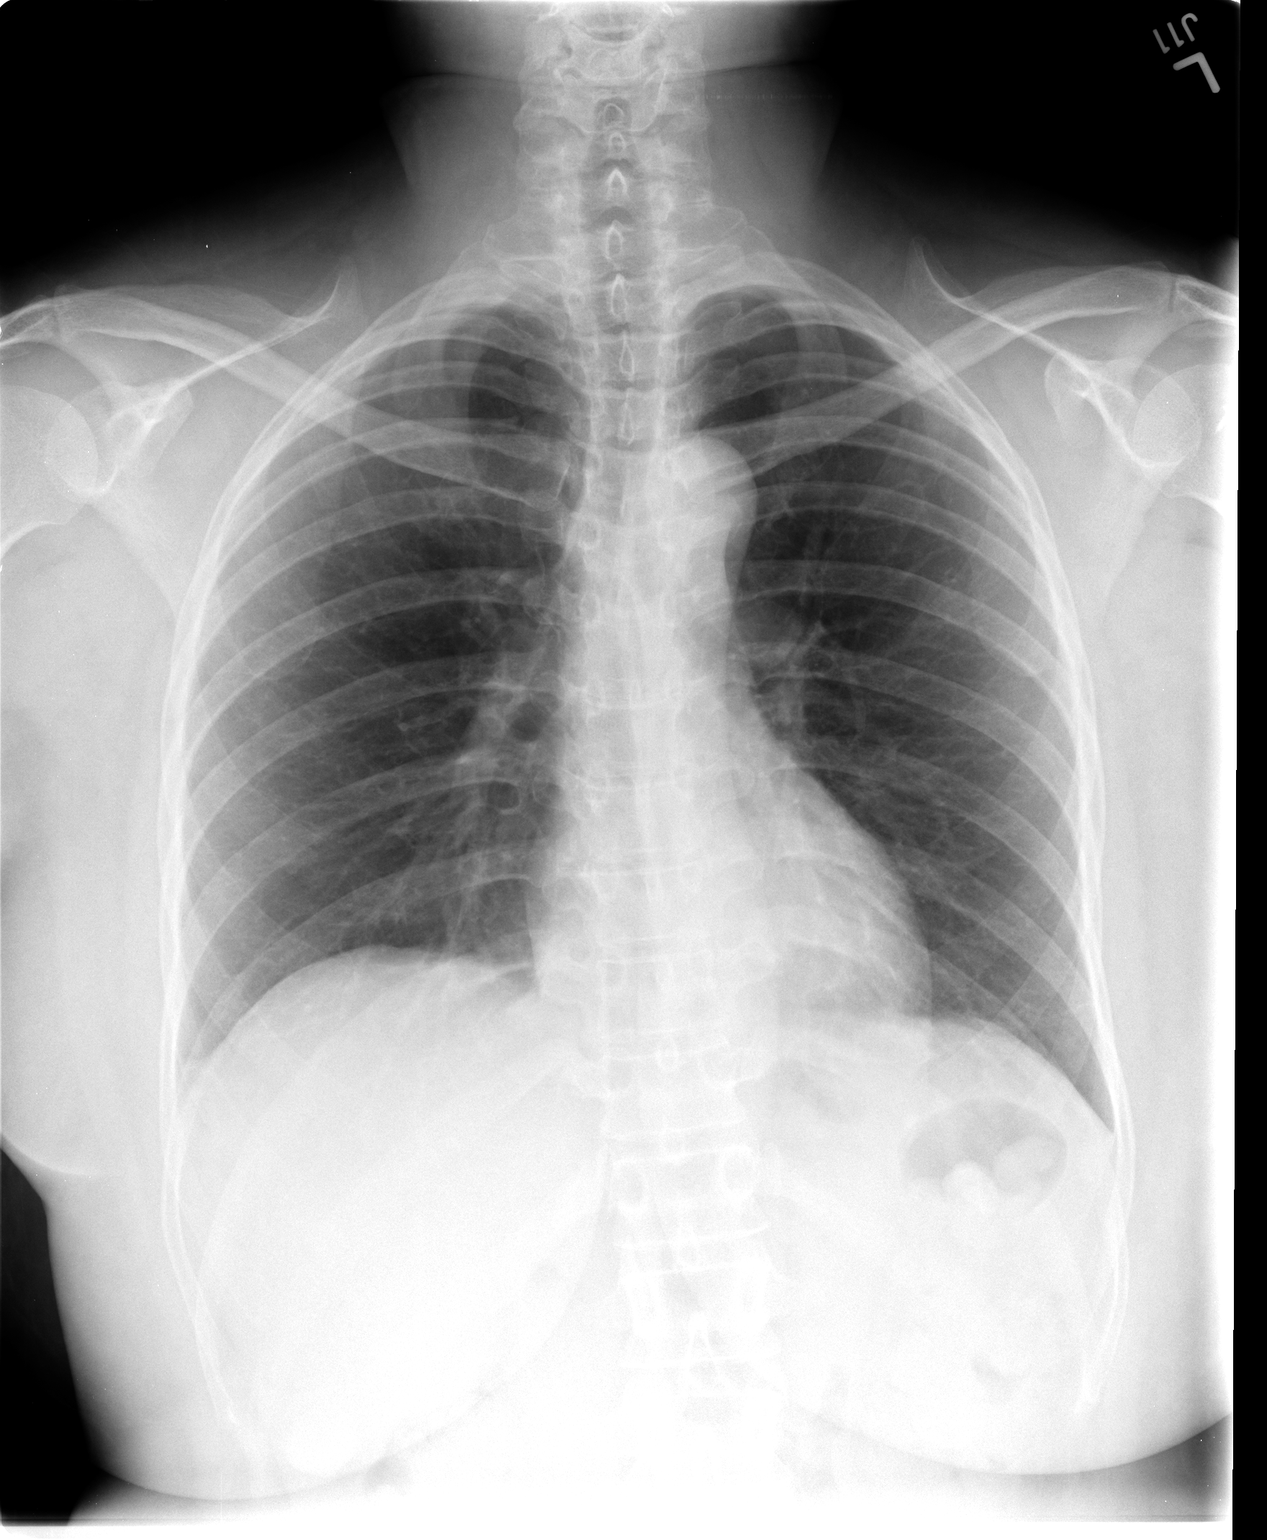

[view not recorded (2 of 2)]
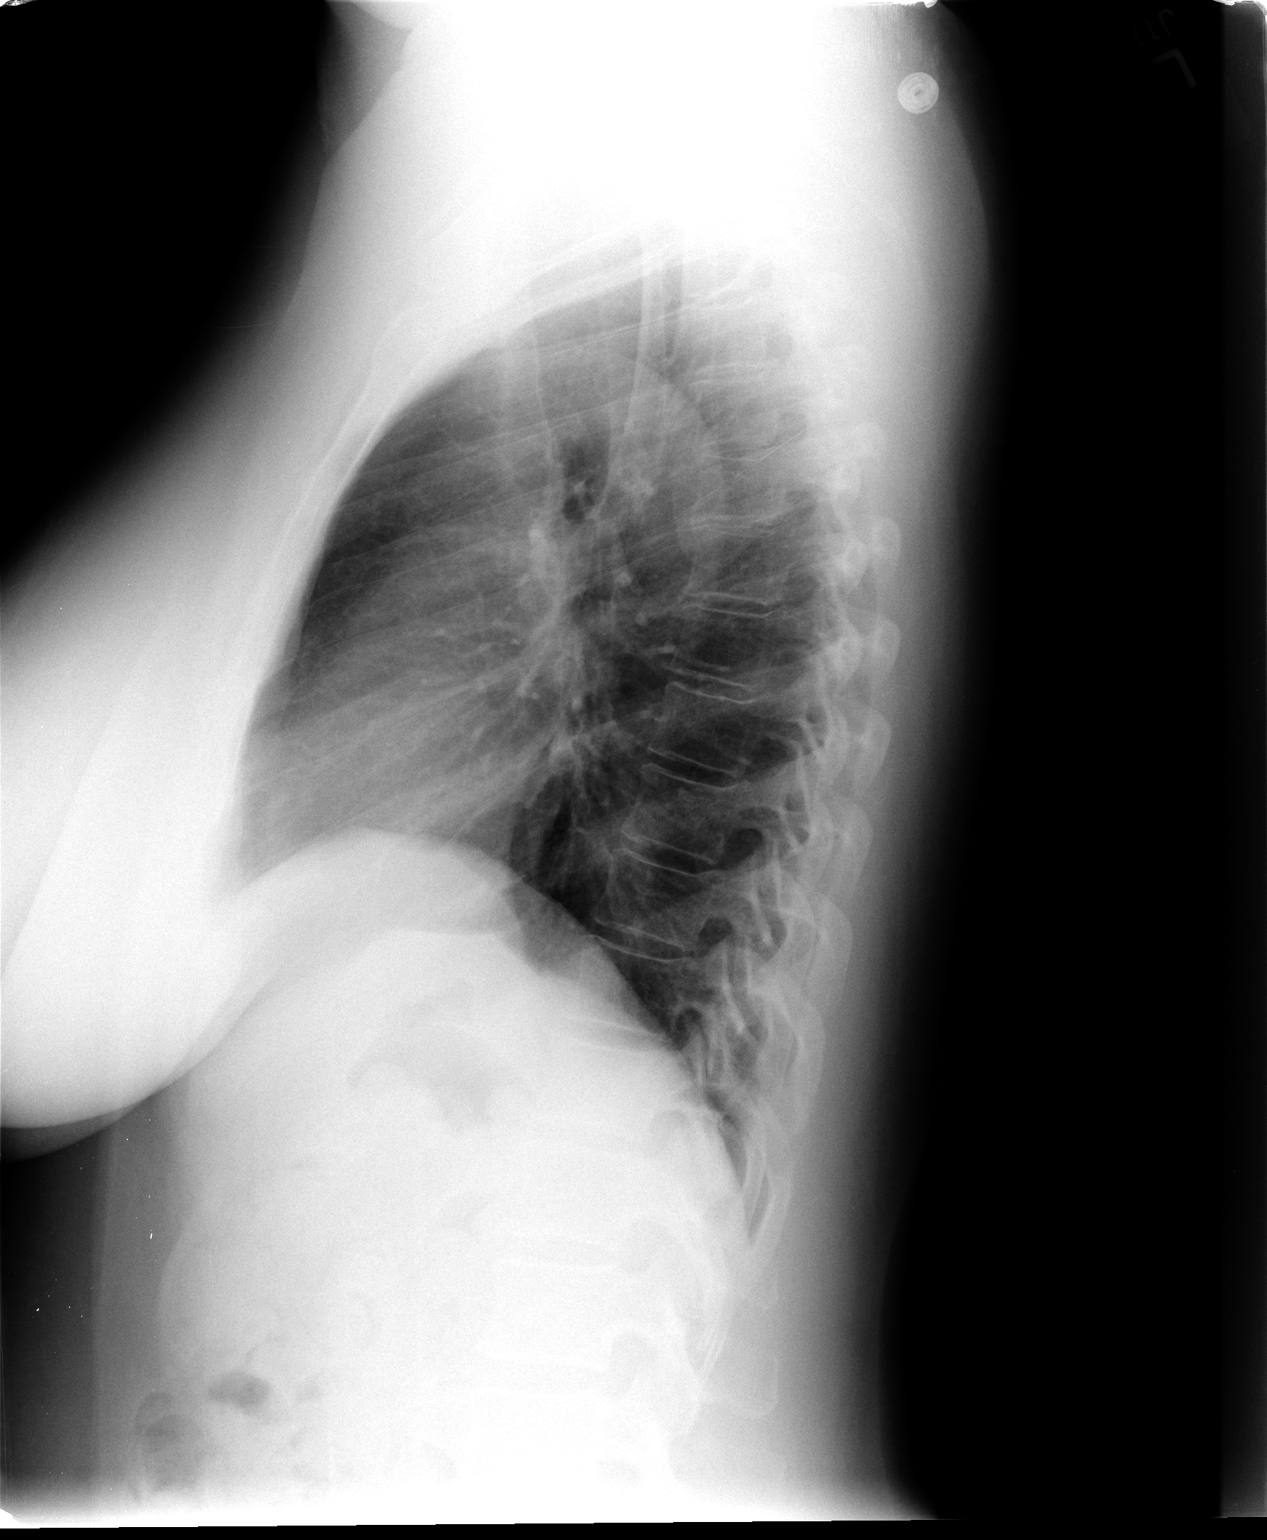

[2 of 2 positions shown; findings below may reference images not displayed]

FINDINGS: Minimal basilar scarring appears stable.  There is no
confluent airspace opacity, edema or pleural effusion.  Heart size
and mediastinal contours are normal.
IMPRESSION: Stable examination.  No acute cardiopulmonary process.

## 2014-05-06 IMAGING — MR MR HEAD WO/W CM
7 of 11 series · 29 of 48 positions shown · IV contrast (multihance)
Comparison: MRI 11/29/2008

CLINICAL DATA: Gait problem.  Leg weakness.

MRI HEAD WITHOUT AND WITH CONTRAST
TECHNIQUE: Multiplanar, multiecho pulse sequences of the brain and
surrounding structures were obtained according to standard protocol
without and with intravenous contrast
Contrast: 14mL MULTIHANCE GADOBENATE DIMEGLUMINE 529 MG/ML IV SOLN

[Series 3: DWI · axial · 5.0mm · 0.72mm/px · z∈[-133,+0]mm · 2 of 22 slices shown (1 of 2)]
[im 1/22]
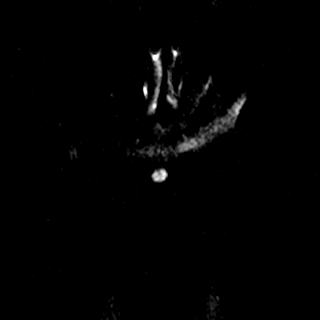
[im 22/22]
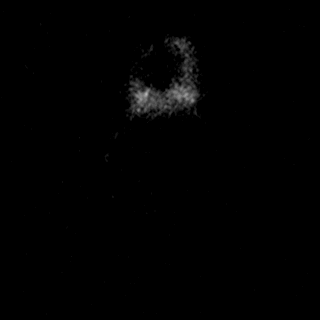

[Series 4: DWI · axial · 5.0mm · 0.72mm/px · z∈[-133,+0]mm · 2 of 22 slices shown (2 of 2)]
[im 1/22]
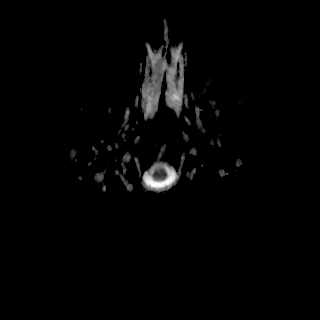
[im 22/22]
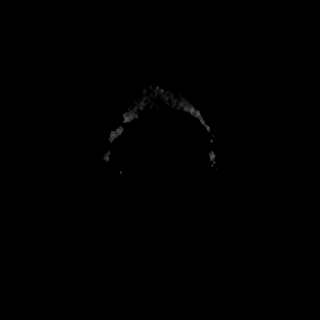

[Series 5: FLAIR · axial · 5.0mm · 0.64mm/px · z∈[-135,-1]mm · 3 of 24 slices shown (1 of 2)]
[im 1/24]
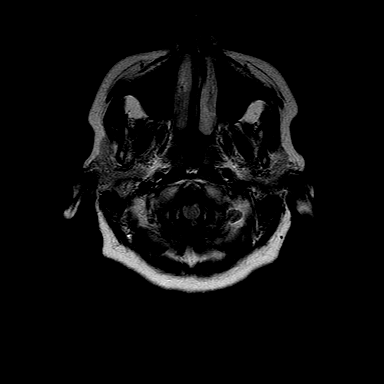
[im 12/24]
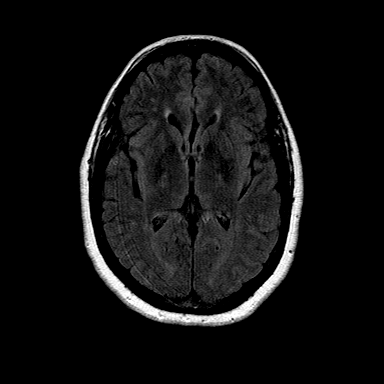
[im 24/24]
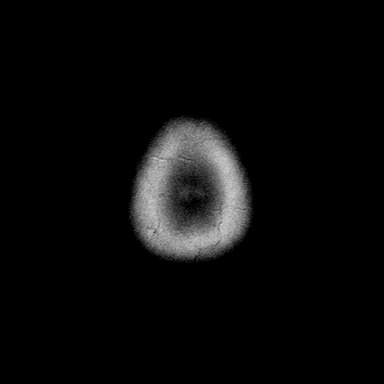

[Series 6: T2 · axial · 5.0mm · 0.57mm/px · z∈[-132,+2]mm · 3 of 24 slices shown]
[im 1/24]
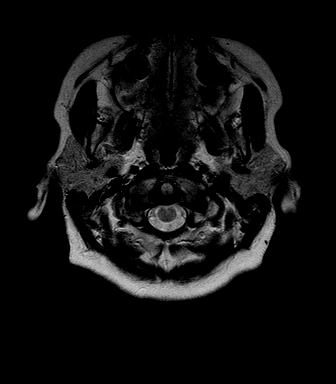
[im 12/24]
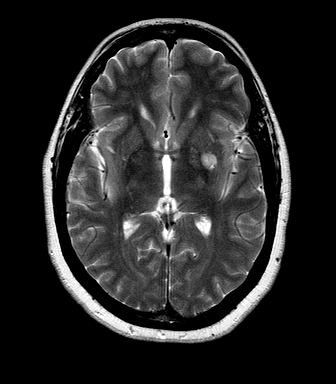
[im 24/24]
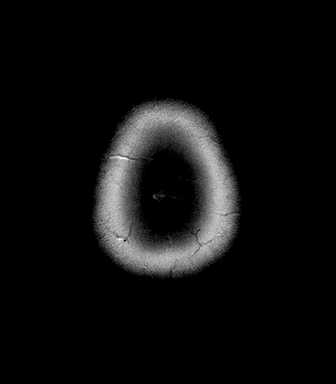

[Series 9: FLAIR · sagittal · 1.8mm · 0.41mm/px · 8 of 88 slices shown (2 of 2)]
[im 1/88]
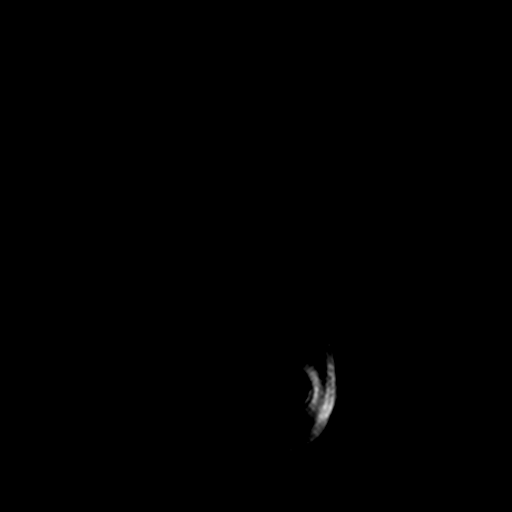
[im 10/88]
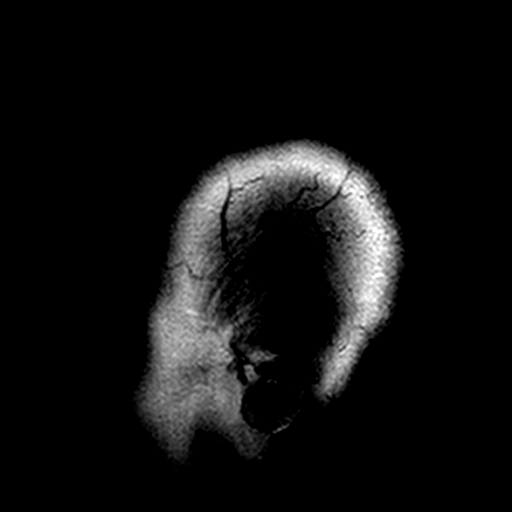
[im 30/88]
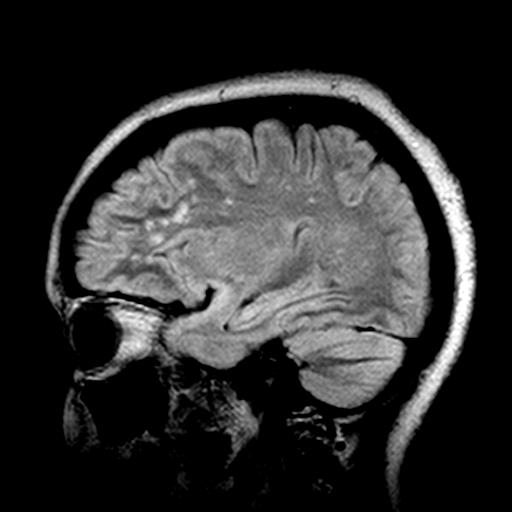
[im 39/88]
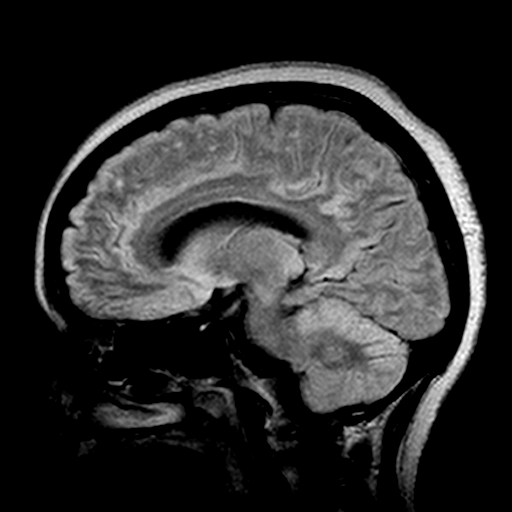
[im 49/88]
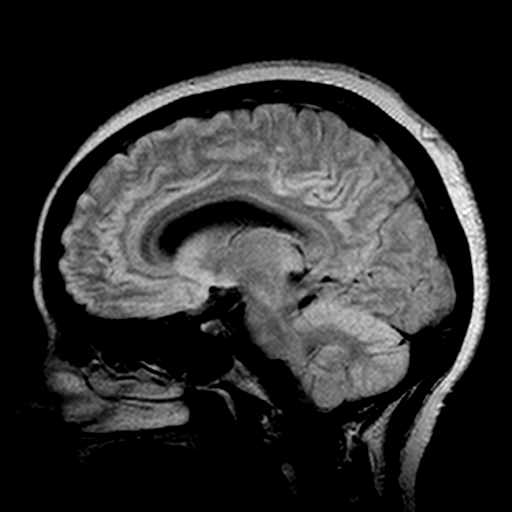
[im 59/88]
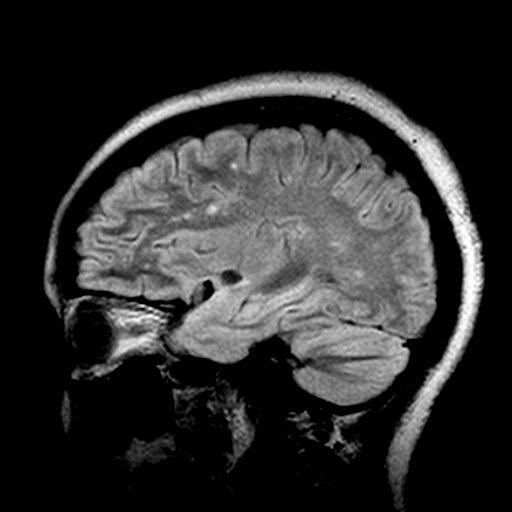
[im 78/88]
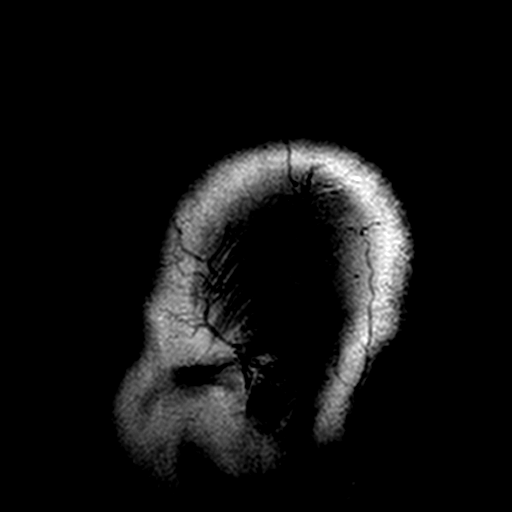
[im 88/88]
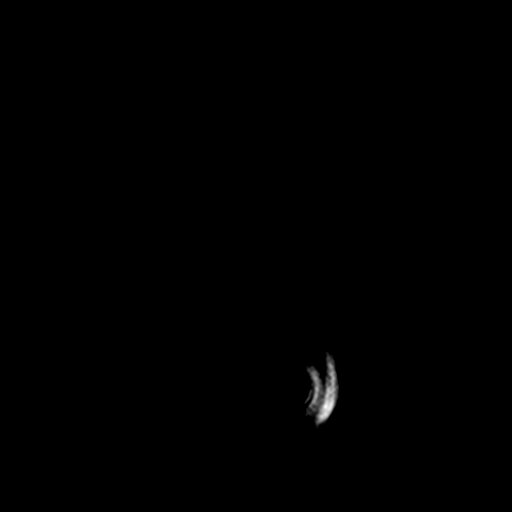

[Series 11: T1 post-contrast · axial · 2.0mm · 0.55mm/px · z∈[-141,+13]mm · 8 of 80 slices shown (1 of 2)]
[im 1/80]
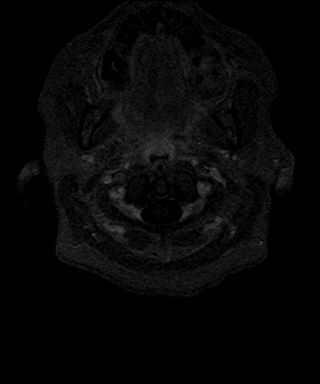
[im 10/80]
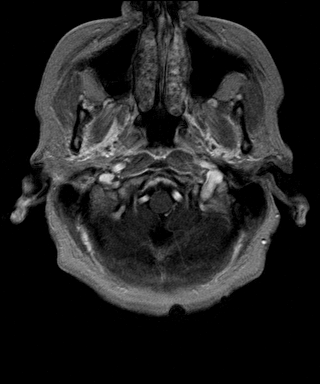
[im 20/80]
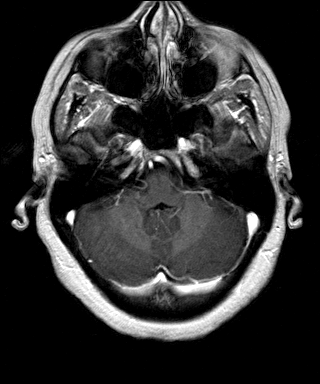
[im 30/80]
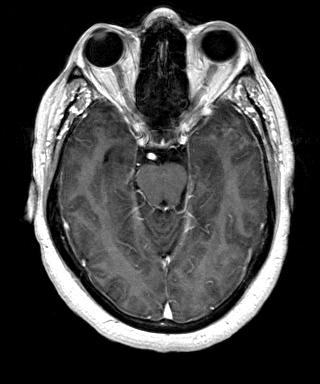
[im 50/80]
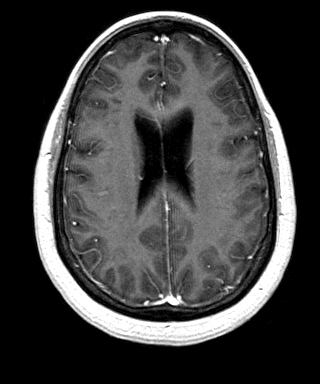
[im 60/80]
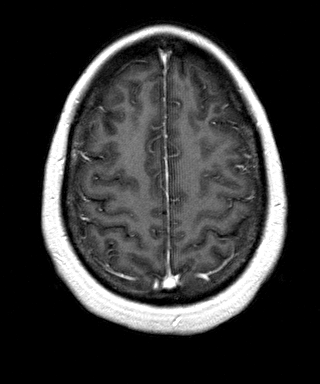
[im 70/80]
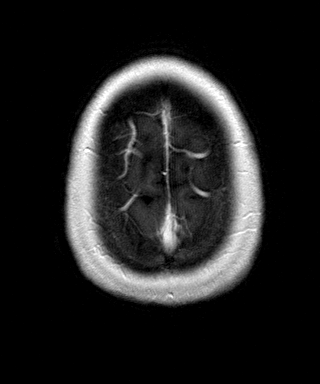
[im 80/80]
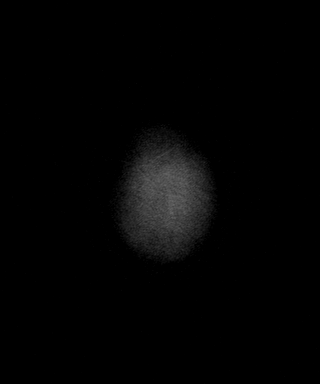

[Series 12: T1 post-contrast · coronal · 5.0mm · 0.40mm/px · 3 of 24 slices shown (2 of 2)]
[im 1/24]
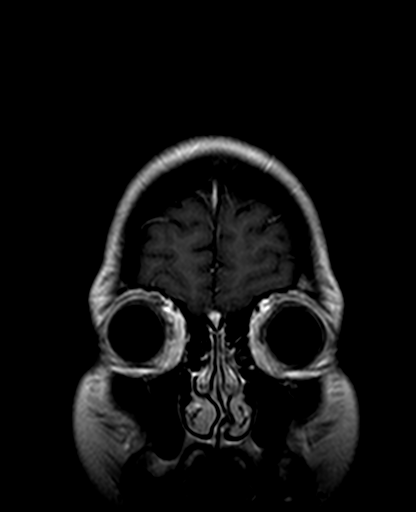
[im 12/24]
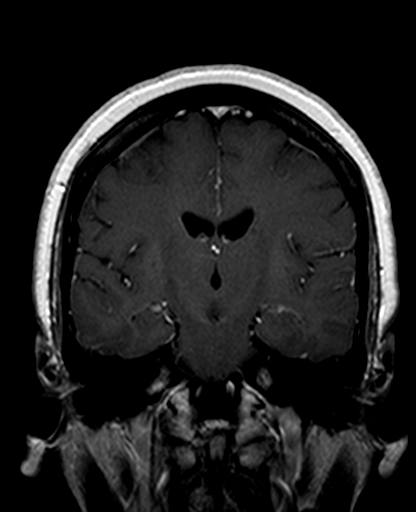
[im 24/24]
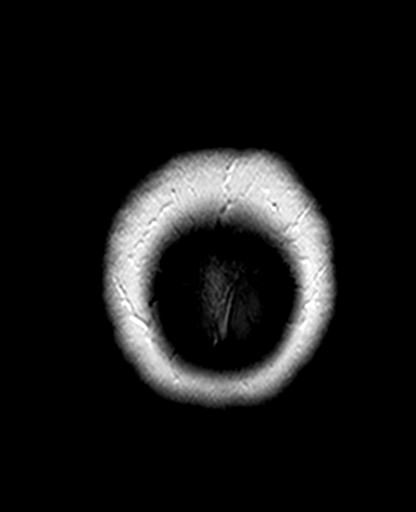

[29 of 48 positions shown; findings below may reference images not displayed]

FINDINGS: Negative for acute infarct.  Scattered small white matter
hyperintensities are unchanged from the  prior study and most
compatible with chronic microvascular ischemia.  Benign cyst in the
left basal ganglia is stable.

Negative for mass or edema.  No hemorrhage is present.  Brainstem
is normal.  Vessels at the base of  the brain are patent.

Postcontrast imaging of the brain reveals normal enhancement.  No
enhancing mass lesion is identified.
IMPRESSION: Mild chronic microvascular ischemia in the white matter.  No acute
infarct or mass.

## 2014-05-06 IMAGING — CT CT HEAD W/O CM
1 series · 16 of 30 positions shown, 20 images · non-contrast
Comparison: Head CT 03/22/2011.  MRI brain 11/29/2008.

CLINICAL DATA: Generalized weakness with unsteady gait and
headache.

CT HEAD WITHOUT CONTRAST
TECHNIQUE: Contiguous axial images were obtained from the base of
the skull through the vertex without contrast.

[Series 2: headseq 4.8 h37s · axial · 0.40mm/px · z∈[+113,+248]mm · 16 of 30 slices shown, 20 images]
[im 2/30  brain]
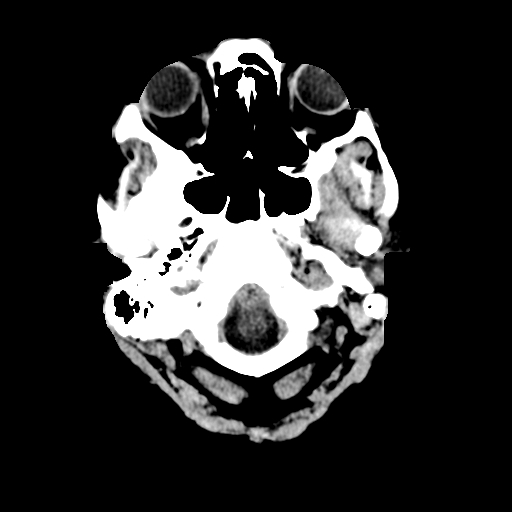
[im 2/30  bone]
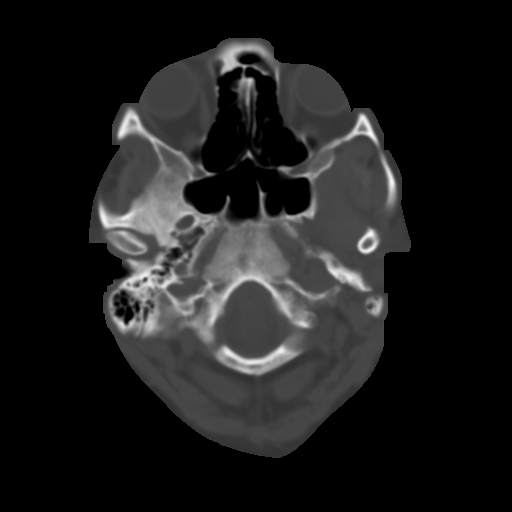
[im 4/30  brain]
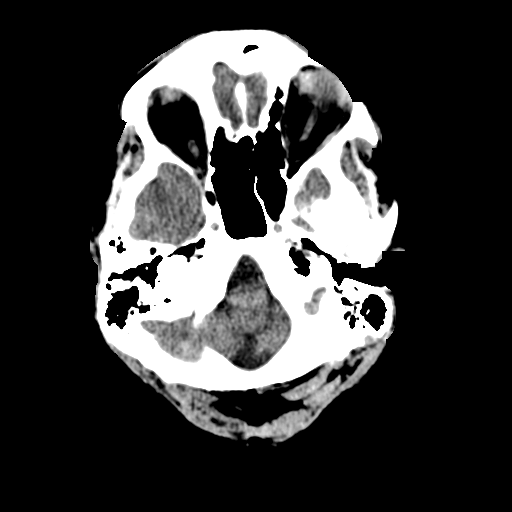
[im 6/30  brain]
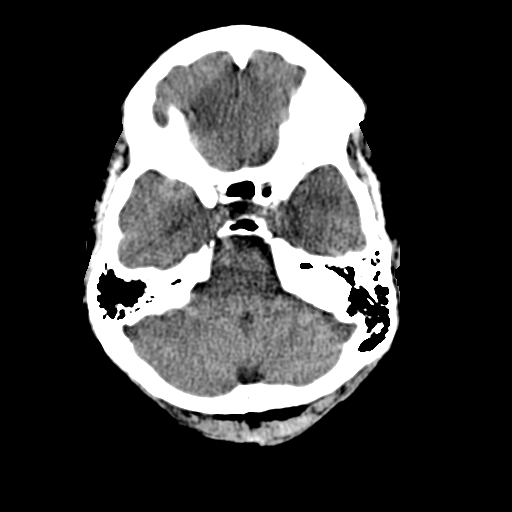
[im 8/30  brain]
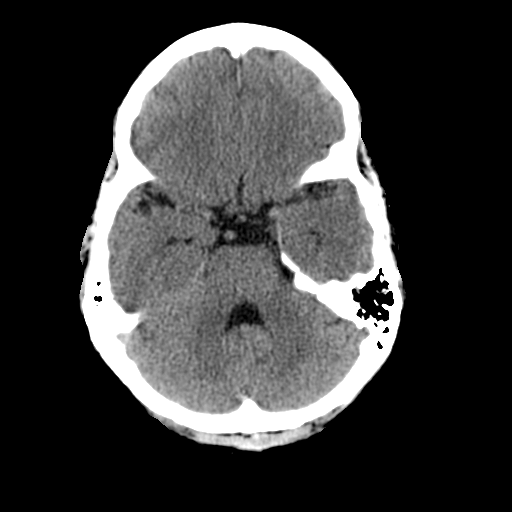
[im 9/30  brain]
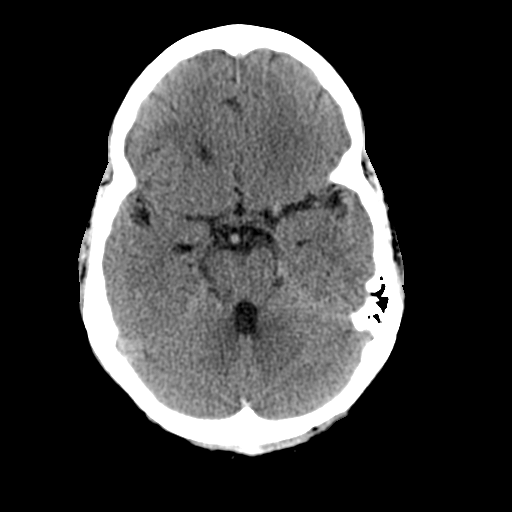
[im 9/30  bone]
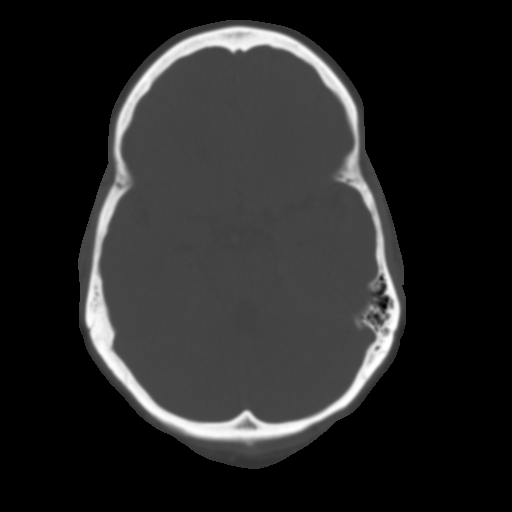
[im 11/30  brain]
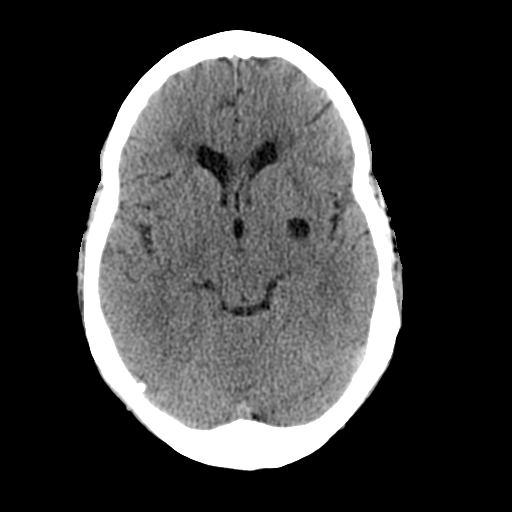
[im 13/30  brain]
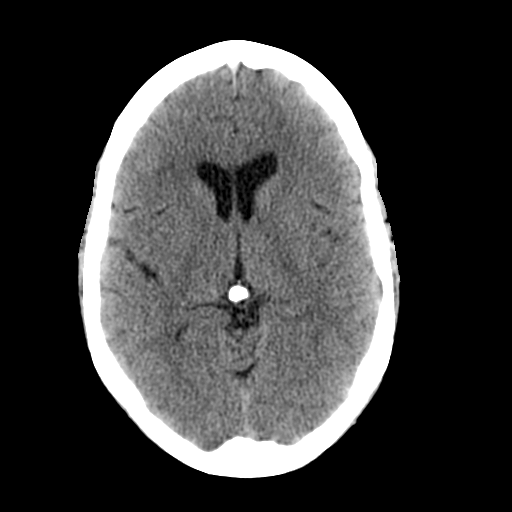
[im 15/30  brain]
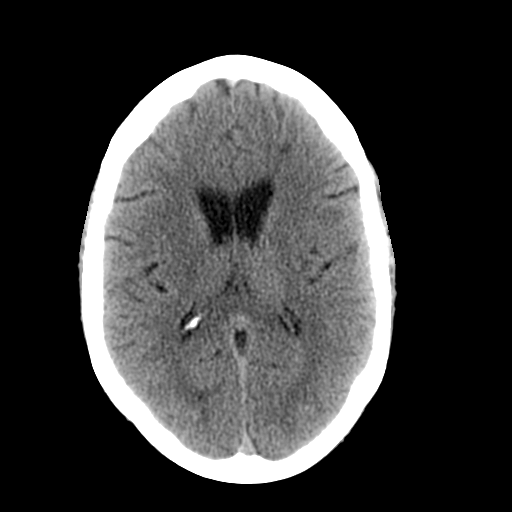
[im 16/30  brain]
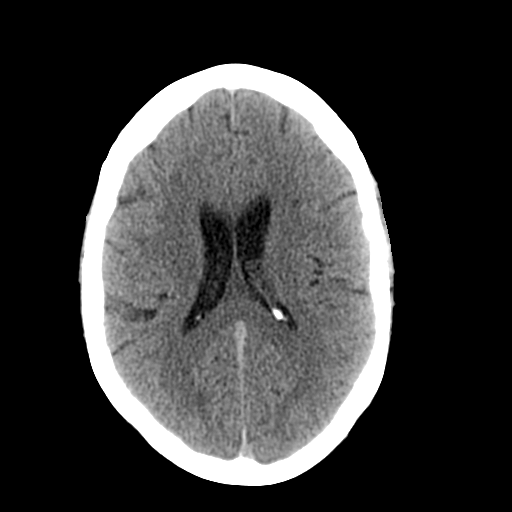
[im 16/30  bone]
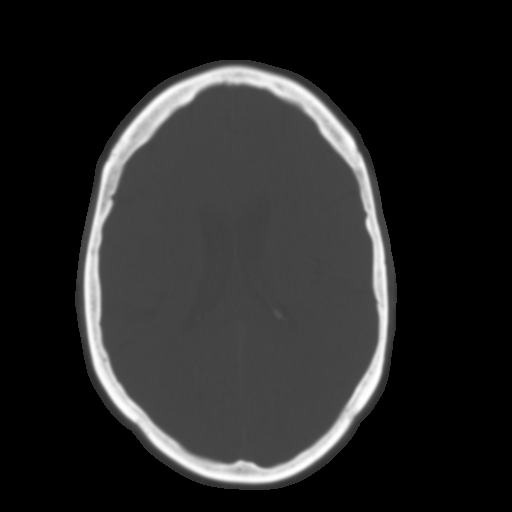
[im 18/30  brain]
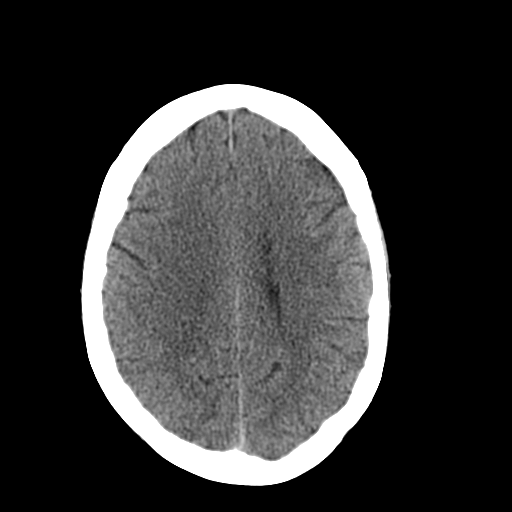
[im 20/30  brain]
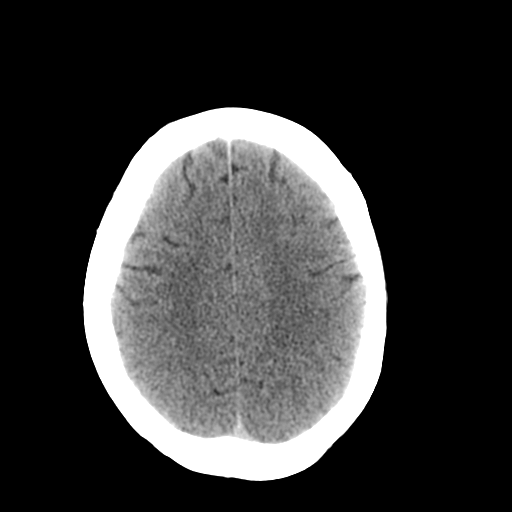
[im 22/30  brain]
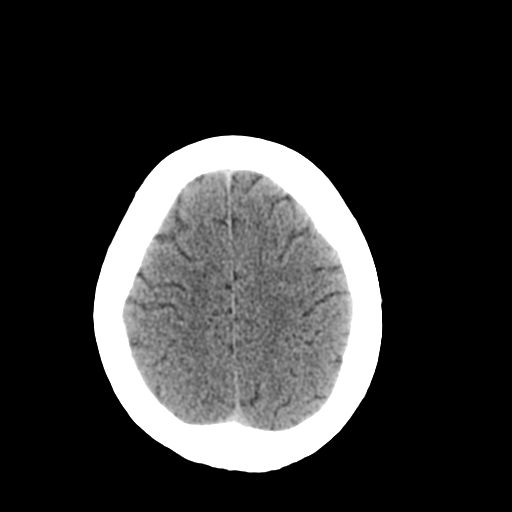
[im 23/30  brain]
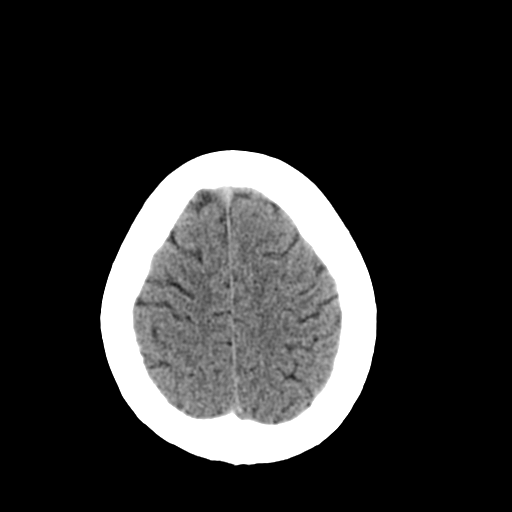
[im 23/30  bone]
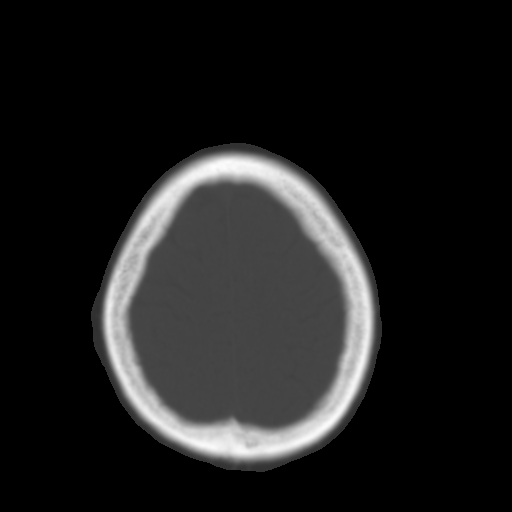
[im 25/30  brain]
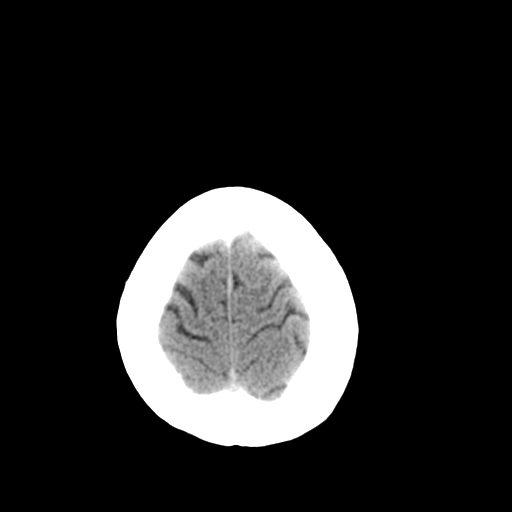
[im 27/30  brain]
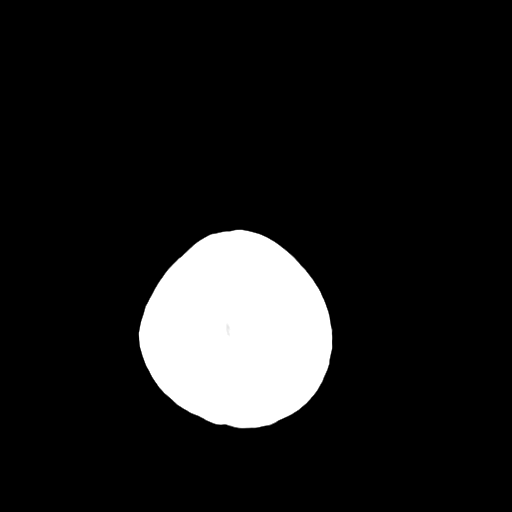
[im 29/30  brain]
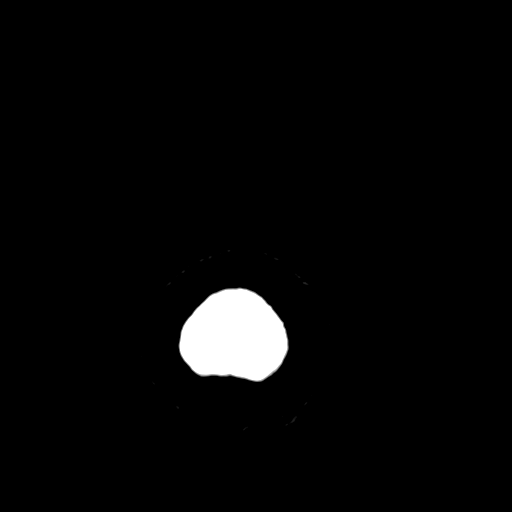

[16 of 30 positions shown; findings below may reference images not displayed]

FINDINGS: There is a stable prominent perivascular space in the
left lentiform nucleus.  There are stable low density lesions in
the frontal periventricular white matter bilaterally.  No acute
intracranial hemorrhage, mass lesion, brain edema or extra-axial
fluid collection is seen.  There is no evidence of cortical
infarct.

The visualized paranasal sinuses remain clear.  Calvarium is
intact.
IMPRESSION: Stable chronic frontal periventricular white matter disease.  No
acute intracranial findings.

## 2014-05-20 ENCOUNTER — Encounter: Payer: Self-pay | Admitting: Orthopedic Surgery

## 2014-05-20 ENCOUNTER — Ambulatory Visit (INDEPENDENT_AMBULATORY_CARE_PROVIDER_SITE_OTHER): Payer: 59 | Admitting: Orthopedic Surgery

## 2014-05-20 VITALS — BP 138/99 | Ht 61.0 in | Wt 146.0 lb

## 2014-05-20 DIAGNOSIS — M542 Cervicalgia: Secondary | ICD-10-CM

## 2014-05-20 DIAGNOSIS — M47812 Spondylosis without myelopathy or radiculopathy, cervical region: Secondary | ICD-10-CM

## 2014-05-20 NOTE — Patient Instructions (Signed)
Follow up with neurosurgery

## 2014-05-20 NOTE — Progress Notes (Signed)
Followup visit status post bilateral carpal tunnel releases in August and July of 2014 after surgery she continued to complain of upper extremity radicular symptoms and she had an MRI and then eventually CT myelogram  CT Myelogram  IMPRESSION: 1. Moderate multilevel cervical spondylosis. Spinal stenosis is greatest at C3-4, where it is moderate. Neural foraminal stenosis is greatest at C6-7, where it is moderate on the right and severe on the left.  She continues to complain of bilateral upper extremity radicular symptoms and I referred her back to the neurosurgeon for further evaluation and treatment. Her medications are up to date.   follow up as needed

## 2014-06-21 ENCOUNTER — Other Ambulatory Visit: Payer: Self-pay | Admitting: Neurosurgery

## 2014-06-28 ENCOUNTER — Telehealth: Payer: Self-pay | Admitting: Orthopedic Surgery

## 2014-06-28 NOTE — Telephone Encounter (Signed)
Patient called to relay that she did return to Dr Joya Salm, and that she is scheduled for surgery "on the neck" at [facility] on 07/09/14.  States just wanted Dr Aline Brochure to be aware.

## 2014-07-05 ENCOUNTER — Other Ambulatory Visit (HOSPITAL_COMMUNITY): Payer: Self-pay | Admitting: Internal Medicine

## 2014-07-05 DIAGNOSIS — I7090 Unspecified atherosclerosis: Secondary | ICD-10-CM

## 2014-07-06 ENCOUNTER — Inpatient Hospital Stay (HOSPITAL_COMMUNITY): Admission: RE | Admit: 2014-07-06 | Payer: 59 | Source: Ambulatory Visit

## 2014-07-07 ENCOUNTER — Ambulatory Visit (HOSPITAL_COMMUNITY)
Admission: RE | Admit: 2014-07-07 | Discharge: 2014-07-07 | Disposition: A | Discharge status: Home / Self Care | Payer: 59 | Source: Ambulatory Visit | Attending: Internal Medicine | Admitting: Internal Medicine

## 2014-07-07 DIAGNOSIS — I7090 Unspecified atherosclerosis: Secondary | ICD-10-CM

## 2014-07-07 DIAGNOSIS — I6523 Occlusion and stenosis of bilateral carotid arteries: Secondary | ICD-10-CM | POA: Diagnosis not present

## 2014-07-28 ENCOUNTER — Encounter (HOSPITAL_COMMUNITY): Payer: Self-pay

## 2014-07-28 ENCOUNTER — Encounter (HOSPITAL_COMMUNITY)
Admission: RE | Admit: 2014-07-28 | Discharge: 2014-07-28 | Disposition: A | Discharge status: Home / Self Care | Payer: 59 | Source: Ambulatory Visit | Attending: Neurosurgery | Admitting: Neurosurgery

## 2014-07-28 ENCOUNTER — Other Ambulatory Visit: Payer: Self-pay

## 2014-07-28 DIAGNOSIS — Z01818 Encounter for other preprocedural examination: Secondary | ICD-10-CM | POA: Diagnosis not present

## 2014-07-28 LAB — SURGICAL PCR SCREEN
MRSA, PCR: NEGATIVE
Staphylococcus aureus: NEGATIVE

## 2014-07-28 LAB — BASIC METABOLIC PANEL
Anion gap: 9 (ref 5–15)
BUN: 7 mg/dL (ref 6–23)
CHLORIDE: 106 meq/L (ref 96–112)
CO2: 24 mmol/L (ref 19–32)
CREATININE: 0.75 mg/dL (ref 0.50–1.10)
Calcium: 9.4 mg/dL (ref 8.4–10.5)
GFR calc non Af Amer: >90 mL/min (ref >90)
Glucose, Bld: 100 mg/dL — ABNORMAL HIGH (ref 70–99)
POTASSIUM: 3.5 mmol/L (ref 3.5–5.1)
Sodium: 139 mmol/L (ref 135–145)

## 2014-07-28 LAB — CBC
HEMATOCRIT: 38.2 % (ref 36.0–46.0)
Hemoglobin: 12.7 g/dL (ref 12.0–15.0)
MCH: 30.6 pg (ref 26.0–34.0)
MCHC: 33.2 g/dL (ref 30.0–36.0)
MCV: 92 fL (ref 78.0–100.0)
Platelets: 267 10*3/uL (ref 150–400)
RBC: 4.15 MIL/uL (ref 3.87–5.11)
RDW: 12.2 % (ref 11.5–15.5)
WBC: 9.1 10*3/uL (ref 4.0–10.5)

## 2014-07-28 NOTE — Pre-Procedure Instructions (Signed)
Kimberly [name]  07/28/2014   Your procedure is scheduled on:  08/06/2014  Report to [facility] Admitting    ENTRANCE A  at   6:30 AM.  Call this number if you have problems the morning of surgery: [phone]   Remember:   Do not eat food or drink liquids after midnight: on Thursday.   Take these medicines the morning of surgery with A SIP OF WATER: You can take the xanax & tramadol the morning of your surgery if you need it.   IN ADDITION TAKE THE FOLLOWING: AMLODIPINE, ACYCLOVIR, ESTRADIOL, GABAPENTIN, use your  INHALER   Do not wear jewelry, make-up or nail polish.   Do not wear lotions, powders, or perfumes. You may wear deodorant.   Do not shave 48 hours prior to surgery.    Do not bring valuables to the hospital.  [facility] is not responsible                  for any belongings or valuables.               Contacts, dentures or bridgework may not be worn into surgery.   Leave suitcase in the car. After surgery it may be brought to your room.   For patients admitted to the hospital, discharge time is determined by your  treatment team.                Patients discharged the day of surgery will not be allowed to drive  home.  Name and phone number of your driver: /w spouse  Special Instructions: Special Instructions: [facility] - Preparing for Surgery  Before surgery, you can play an important role.  Because skin is not sterile, your skin needs to be as free of germs as possible.  You can reduce the number of germs on you skin by washing with CHG (chlorahexidine gluconate) soap before surgery.  CHG is an antiseptic cleaner which kills germs and bonds with the skin to continue killing germs even after washing.  Please DO NOT use if you have an allergy to CHG or antibacterial soaps.  If your skin becomes reddened/irritated stop using the CHG and inform your nurse when you arrive at Short Stay.  Do not shave (including legs and underarms) for at least 48 hours  prior to the first CHG shower.  You may shave your face.  Please follow these instructions carefully:   1.  Shower with CHG Soap the night before surgery and the  morning of Surgery.  2.  If you choose to wash your hair, wash your hair first as usual with your  normal shampoo.  3.  After you shampoo, rinse your hair and body thoroughly to remove the  Shampoo.  4.  Use CHG as you would any other liquid soap.  You can apply chg directly to the skin and wash gently with scrungie or a clean washcloth.  5.  Apply the CHG Soap to your body ONLY FROM THE NECK DOWN.    Do not use on open wounds or open sores.  Avoid contact with your eyes, ears, mouth and genitals (private parts).  Wash genitals (private parts)   with your normal soap.  6.  Wash thoroughly, paying special attention to the area where your surgery will be performed.  7.  Thoroughly rinse your body with warm water from the neck down.  8.  DO NOT shower/wash with your normal soap after using and rinsing  off   the CHG Soap.  9.  Pat yourself dry with a clean towel.            10.  Wear clean pajamas.            11.  Place clean sheets on your bed the night of your first shower and do not sleep with pets.  Day of Surgery  Do not apply any lotions/deodorants the morning of surgery.  Please wear clean clothes to the hospital/surgery center.   Please read over the following fact sheets that you were given: Pain Booklet, Coughing and Deep Breathing, MRSA Information and Surgical Site Infection Prevention

## 2014-07-30 HISTORY — PX: NECK SURGERY: SHX720

## 2014-08-05 MED ORDER — CEFAZOLIN SODIUM-DEXTROSE 2-3 GM-% IV SOLR
2.0000 g | INTRAVENOUS | Status: AC
  Administered 2014-08-06: 2 g via INTRAVENOUS
  Filled 2014-08-05: qty 50

## 2014-08-05 NOTE — H&P (Signed)
Kimberly [name] is an 55 y.o. female.   Chief Complaint: neck pain HPI: patient who came to my office complaining of neck pain with radiation to both upper extremities as well as lumbar pain with pain going to both legs. Conservative treatment has not help and she end up having a cervical and lumbar myelogram.  Past Medical History  Diagnosis Date  . COPD (chronic obstructive pulmonary disease)   . Herpes   . Hypertension     pt. doesn't see cardiologist, followed for HTN by Dr. Gerarda Fraction  . Shortness of breath dyspnea   . Anxiety     takes alprazolam - rare use   . GERD (gastroesophageal reflux disease)     pt. reports that its better, no meds in use at this time- 2015  . Arthritis     cervical spondylosis     Past Surgical History  Procedure Laterality Date  . Abdominal hysterectomy    . Rotator cuff repair Left   . Foreign body removal Left     knee-as child  . Carpal tunnel release Left 02/16/2013    Procedure: CARPAL TUNNEL RELEASE;  Surgeon: Carole Civil, MD;  Location: [facility];  Service: Orthopedics;  Laterality: Left;  . Carpal tunnel release Right 03/27/2013    Procedure: RIGHT CARPAL TUNNEL RELEASE;  Surgeon: Carole Civil, MD;  Location: [facility];  Service: Orthopedics;  Laterality: Right;  . Cesarean section      x2    History reviewed. No pertinent family history. Social History:  reports that she has never smoked. She does not have any smokeless tobacco history on file. She reports that she does not drink alcohol or use illicit drugs.  Allergies:  Allergies  Allergen Reactions  . Peanut-Containing Drug Products Anaphylaxis and Hives  . Shellfish Allergy Anaphylaxis and Hives  . Codeine Other (See Comments)    jitters    No prescriptions prior to admission    No results found for this or any previous visit (from the past 48 hour(s)). No results found.  Review of Systems  Constitutional: Negative.   Respiratory: Negative.   Cardiovascular:  Negative.   Gastrointestinal: Negative.   Genitourinary: Negative.   Musculoskeletal: Positive for neck pain.  Skin: Negative.   Neurological: Positive for sensory change and focal weakness.  Endo/Heme/Allergies: Negative.   Psychiatric/Behavioral: Positive for depression.    There were no vitals taken for this visit. Physical Exam hent, nl. Neck, pain with mobility. Cv, nl. Lungs, clear. Abdomen, soft. Extremities ,scar from previou surgery. NEURO  Weakness of triceps, sensory normal but she compains of burnig going from her neck to both hand. Dtr, nl. She has decrease of lumbar mobility. The cervical myelogram shows ddd at c3-4, 6-7,7-t1. Is borderline atc4-5,5-6. The lumbar shows hnp at l5-s1  Assessment/Plan Patient to go ahead with acd 34,67 7t1. She is aware of risks and benefits. We talked about doing one long incisional  Wound horizontal versus 2 transverse and she decided the lattre.   Vertis Bauder M 08/05/2014, 6:09 PM

## 2014-08-06 ENCOUNTER — Inpatient Hospital Stay (HOSPITAL_COMMUNITY): Payer: 59 | Admitting: Certified Registered"

## 2014-08-06 ENCOUNTER — Inpatient Hospital Stay (HOSPITAL_COMMUNITY): Payer: 59

## 2014-08-06 ENCOUNTER — Encounter (HOSPITAL_COMMUNITY)
Admission: RE | Disposition: A | Discharge status: Home / Self Care | Payer: Self-pay | Source: Ambulatory Visit | Attending: Neurosurgery

## 2014-08-06 ENCOUNTER — Encounter (HOSPITAL_COMMUNITY): Payer: Self-pay | Admitting: *Deleted

## 2014-08-06 ENCOUNTER — Inpatient Hospital Stay (HOSPITAL_COMMUNITY)
Admission: RE | Admit: 2014-08-06 | Discharge: 2014-08-09 | DRG: 473 | Disposition: A | Discharge status: Home / Self Care | Payer: 59 | Source: Ambulatory Visit | Attending: Neurosurgery | Admitting: Neurosurgery

## 2014-08-06 DIAGNOSIS — M2578 Osteophyte, vertebrae: Secondary | ICD-10-CM | POA: Diagnosis present

## 2014-08-06 DIAGNOSIS — Z885 Allergy status to narcotic agent status: Secondary | ICD-10-CM

## 2014-08-06 DIAGNOSIS — M1990 Unspecified osteoarthritis, unspecified site: Secondary | ICD-10-CM | POA: Diagnosis present

## 2014-08-06 DIAGNOSIS — I10 Essential (primary) hypertension: Secondary | ICD-10-CM | POA: Diagnosis present

## 2014-08-06 DIAGNOSIS — Z91010 Allergy to peanuts: Secondary | ICD-10-CM | POA: Diagnosis not present

## 2014-08-06 DIAGNOSIS — F419 Anxiety disorder, unspecified: Secondary | ICD-10-CM | POA: Diagnosis present

## 2014-08-06 DIAGNOSIS — M4802 Spinal stenosis, cervical region: Principal | ICD-10-CM | POA: Diagnosis present

## 2014-08-06 DIAGNOSIS — K219 Gastro-esophageal reflux disease without esophagitis: Secondary | ICD-10-CM | POA: Diagnosis present

## 2014-08-06 DIAGNOSIS — Z91013 Allergy to seafood: Secondary | ICD-10-CM

## 2014-08-06 DIAGNOSIS — Z419 Encounter for procedure for purposes other than remedying health state, unspecified: Secondary | ICD-10-CM

## 2014-08-06 DIAGNOSIS — M545 Low back pain: Secondary | ICD-10-CM | POA: Diagnosis present

## 2014-08-06 DIAGNOSIS — IMO0002 Reserved for concepts with insufficient information to code with codable children: Secondary | ICD-10-CM

## 2014-08-06 DIAGNOSIS — M542 Cervicalgia: Secondary | ICD-10-CM | POA: Diagnosis present

## 2014-08-06 DIAGNOSIS — J449 Chronic obstructive pulmonary disease, unspecified: Secondary | ICD-10-CM | POA: Diagnosis present

## 2014-08-06 HISTORY — PX: ANTERIOR CERVICAL DECOMP/DISCECTOMY FUSION: SHX1161

## 2014-08-06 HISTORY — DX: Unspecified osteoarthritis, unspecified site: M19.90

## 2014-08-06 HISTORY — DX: Anxiety disorder, unspecified: F41.9

## 2014-08-06 HISTORY — DX: Reserved for inherently not codable concepts without codable children: IMO0001

## 2014-08-06 LAB — ABO/RH: ABO/RH(D): B POS

## 2014-08-06 LAB — TYPE AND SCREEN
ABO/RH(D): B POS
Antibody Screen: NEGATIVE

## 2014-08-06 SURGERY — ANTERIOR CERVICAL DECOMPRESSION/DISCECTOMY FUSION 3 LEVELS
Anesthesia: General

## 2014-08-06 MED ORDER — 0.9 % SODIUM CHLORIDE (POUR BTL) OPTIME
TOPICAL | Status: DC | PRN
  Administered 2014-08-06: 1000 mL

## 2014-08-06 MED ORDER — DEXAMETHASONE SODIUM PHOSPHATE 10 MG/ML IJ SOLN
INTRAMUSCULAR | Status: AC
  Filled 2014-08-06: qty 1

## 2014-08-06 MED ORDER — PHENOL 1.4 % MT LIQD
1.0000 | OROMUCOSAL | Status: DC | PRN
Start: 2014-08-06 — End: 2014-08-09
  Filled 2014-08-06: qty 177

## 2014-08-06 MED ORDER — GLYCOPYRROLATE 0.2 MG/ML IJ SOLN
INTRAMUSCULAR | Status: AC
  Filled 2014-08-06: qty 3

## 2014-08-06 MED ORDER — BUDESONIDE-FORMOTEROL FUMARATE 160-4.5 MCG/ACT IN AERO
2.0000 | INHALATION_SPRAY | Freq: Two times a day (BID) | RESPIRATORY_TRACT | Status: DC
  Administered 2014-08-07 – 2014-08-09 (×5): 2 via RESPIRATORY_TRACT
  Filled 2014-08-06: qty 6

## 2014-08-06 MED ORDER — OXYCODONE-ACETAMINOPHEN 5-325 MG PO TABS
1.0000 | ORAL_TABLET | ORAL | Status: DC | PRN
  Administered 2014-08-08 (×2): 2 via ORAL
  Administered 2014-08-08: 1 via ORAL
  Administered 2014-08-08 – 2014-08-09 (×2): 2 via ORAL
  Filled 2014-08-06 (×4): qty 2
  Filled 2014-08-06: qty 1
  Filled 2014-08-06: qty 2

## 2014-08-06 MED ORDER — MORPHINE SULFATE 2 MG/ML IJ SOLN
1.0000 mg | INTRAMUSCULAR | Status: DC | PRN
  Administered 2014-08-06 (×3): 2 mg via INTRAVENOUS
  Administered 2014-08-07: 4 mg via INTRAVENOUS
  Administered 2014-08-07: 2 mg via INTRAVENOUS
  Administered 2014-08-07 (×2): 3 mg via INTRAVENOUS
  Filled 2014-08-06 (×2): qty 1
  Filled 2014-08-06: qty 2
  Filled 2014-08-06 (×2): qty 1
  Filled 2014-08-06 (×2): qty 2
  Filled 2014-08-06: qty 1

## 2014-08-06 MED ORDER — CEFAZOLIN SODIUM 1-5 GM-% IV SOLN
1.0000 g | Freq: Three times a day (TID) | INTRAVENOUS | Status: AC
  Administered 2014-08-06 – 2014-08-07 (×2): 1 g via INTRAVENOUS
  Filled 2014-08-06 (×2): qty 50

## 2014-08-06 MED ORDER — NEOSTIGMINE METHYLSULFATE 10 MG/10ML IV SOLN
INTRAVENOUS | Status: DC | PRN
  Administered 2014-08-06: 4 mg via INTRAVENOUS

## 2014-08-06 MED ORDER — MENTHOL 3 MG MT LOZG: 1.0000 | LOZENGE | OROMUCOSAL | Status: DC | PRN

## 2014-08-06 MED ORDER — HYDROMORPHONE HCL 1 MG/ML IJ SOLN
0.2500 mg | INTRAMUSCULAR | Status: DC | PRN
  Administered 2014-08-06 (×4): 0.5 mg via INTRAVENOUS

## 2014-08-06 MED ORDER — LACTATED RINGERS IV SOLN
INTRAVENOUS | Status: DC | PRN
  Administered 2014-08-06 (×2): via INTRAVENOUS

## 2014-08-06 MED ORDER — ROCURONIUM BROMIDE 100 MG/10ML IV SOLN
INTRAVENOUS | Status: DC | PRN
  Administered 2014-08-06: 30 mg via INTRAVENOUS
  Administered 2014-08-06: 10 mg via INTRAVENOUS

## 2014-08-06 MED ORDER — SODIUM CHLORIDE 0.9 % IV SOLN
INTRAVENOUS | Status: DC
  Administered 2014-08-06: 23:00:00 via INTRAVENOUS

## 2014-08-06 MED ORDER — DEXAMETHASONE 4 MG PO TABS
4.0000 mg | ORAL_TABLET | Freq: Four times a day (QID) | ORAL | Status: DC
  Administered 2014-08-06 – 2014-08-09 (×11): 4 mg via ORAL
  Filled 2014-08-06 (×11): qty 1

## 2014-08-06 MED ORDER — AMLODIPINE BESYLATE 5 MG PO TABS
5.0000 mg | ORAL_TABLET | Freq: Every day | ORAL | Status: DC
  Administered 2014-08-07 – 2014-08-09 (×3): 5 mg via ORAL
  Filled 2014-08-06 (×3): qty 1

## 2014-08-06 MED ORDER — THROMBIN 20000 UNITS EX SOLR
CUTANEOUS | Status: DC | PRN
  Administered 2014-08-06: 10:00:00 via TOPICAL

## 2014-08-06 MED ORDER — THROMBIN 5000 UNITS EX SOLR
OROMUCOSAL | Status: DC | PRN
  Administered 2014-08-06: 09:00:00 via TOPICAL

## 2014-08-06 MED ORDER — DEXAMETHASONE SODIUM PHOSPHATE 4 MG/ML IJ SOLN
4.0000 mg | Freq: Four times a day (QID) | INTRAMUSCULAR | Status: DC
  Administered 2014-08-07: 4 mg via INTRAVENOUS
  Filled 2014-08-06: qty 1

## 2014-08-06 MED ORDER — FENTANYL CITRATE 0.05 MG/ML IJ SOLN
INTRAMUSCULAR | Status: AC
  Filled 2014-08-06: qty 5

## 2014-08-06 MED ORDER — DEXAMETHASONE SODIUM PHOSPHATE 4 MG/ML IJ SOLN
INTRAMUSCULAR | Status: DC | PRN
  Administered 2014-08-06: 10 mg via INTRAVENOUS

## 2014-08-06 MED ORDER — ARTIFICIAL TEARS OP OINT
TOPICAL_OINTMENT | OPHTHALMIC | Status: DC | PRN
  Administered 2014-08-06: 1 via OPHTHALMIC

## 2014-08-06 MED ORDER — GLYCOPYRROLATE 0.2 MG/ML IJ SOLN
INTRAMUSCULAR | Status: DC | PRN
  Administered 2014-08-06: 0.6 mg via INTRAVENOUS

## 2014-08-06 MED ORDER — FLUCONAZOLE 150 MG PO TABS
150.0000 mg | ORAL_TABLET | Freq: Once | ORAL | Status: AC
  Administered 2014-08-06: 150 mg via ORAL
  Filled 2014-08-06: qty 1

## 2014-08-06 MED ORDER — PROPOFOL 10 MG/ML IV BOLUS
INTRAVENOUS | Status: DC | PRN
  Administered 2014-08-06: 200 mg via INTRAVENOUS

## 2014-08-06 MED ORDER — SODIUM CHLORIDE 0.9 % IJ SOLN: 3.0000 mL | INTRAMUSCULAR | Status: DC | PRN

## 2014-08-06 MED ORDER — ACETAMINOPHEN 650 MG RE SUPP: 650.0000 mg | RECTAL | Status: DC | PRN

## 2014-08-06 MED ORDER — ACETAMINOPHEN 325 MG PO TABS: 650.0000 mg | ORAL_TABLET | ORAL | Status: DC | PRN

## 2014-08-06 MED ORDER — PHENYLEPHRINE HCL 10 MG/ML IJ SOLN
INTRAMUSCULAR | Status: DC | PRN
  Administered 2014-08-06: 80 ug via INTRAVENOUS
  Administered 2014-08-06: 40 ug via INTRAVENOUS
  Administered 2014-08-06: 80 ug via INTRAVENOUS

## 2014-08-06 MED ORDER — NEOSTIGMINE METHYLSULFATE 10 MG/10ML IV SOLN
INTRAVENOUS | Status: AC
  Filled 2014-08-06: qty 1

## 2014-08-06 MED ORDER — SODIUM CHLORIDE 0.9 % IV SOLN
250.0000 mL | INTRAVENOUS | Status: DC
  Administered 2014-08-06: 250 mL via INTRAVENOUS

## 2014-08-06 MED ORDER — ONDANSETRON HCL 4 MG/2ML IJ SOLN
INTRAMUSCULAR | Status: DC | PRN
  Administered 2014-08-06: 4 mg via INTRAVENOUS

## 2014-08-06 MED ORDER — ESTRADIOL 1 MG PO TABS
1.0000 mg | ORAL_TABLET | Freq: Every day | ORAL | Status: DC
  Administered 2014-08-07 – 2014-08-09 (×3): 1 mg via ORAL
  Filled 2014-08-06 (×4): qty 1

## 2014-08-06 MED ORDER — HYDROMORPHONE HCL 1 MG/ML IJ SOLN
INTRAMUSCULAR | Status: AC
  Filled 2014-08-06: qty 1

## 2014-08-06 MED ORDER — GABAPENTIN 100 MG PO CAPS
200.0000 mg | ORAL_CAPSULE | Freq: Three times a day (TID) | ORAL | Status: DC
  Administered 2014-08-06 – 2014-08-09 (×9): 200 mg via ORAL
  Filled 2014-08-06 (×9): qty 2

## 2014-08-06 MED ORDER — ONDANSETRON HCL 4 MG/2ML IJ SOLN
4.0000 mg | INTRAMUSCULAR | Status: DC | PRN
  Administered 2014-08-07: 4 mg via INTRAVENOUS
  Filled 2014-08-06: qty 2

## 2014-08-06 MED ORDER — PROPOFOL 10 MG/ML IV BOLUS
INTRAVENOUS | Status: AC
  Filled 2014-08-06: qty 20

## 2014-08-06 MED ORDER — DIAZEPAM 5 MG PO TABS: 5.0000 mg | ORAL_TABLET | Freq: Four times a day (QID) | ORAL | Status: DC | PRN

## 2014-08-06 MED ORDER — SODIUM CHLORIDE 0.9 % IJ SOLN
3.0000 mL | Freq: Two times a day (BID) | INTRAMUSCULAR | Status: DC
  Administered 2014-08-07 – 2014-08-09 (×3): 3 mL via INTRAVENOUS

## 2014-08-06 MED ORDER — ONDANSETRON HCL 4 MG/2ML IJ SOLN: 4.0000 mg | Freq: Once | INTRAMUSCULAR | Status: DC | PRN

## 2014-08-06 MED ORDER — LACTATED RINGERS IV SOLN
INTRAVENOUS | Status: DC
  Administered 2014-08-06: 07:00:00 via INTRAVENOUS

## 2014-08-06 MED ORDER — LIDOCAINE HCL (CARDIAC) 20 MG/ML IV SOLN
INTRAVENOUS | Status: DC | PRN
  Administered 2014-08-06: 60 mg via INTRAVENOUS

## 2014-08-06 MED ORDER — ZOLPIDEM TARTRATE 5 MG PO TABS: 5.0000 mg | ORAL_TABLET | Freq: Every evening | ORAL | Status: DC | PRN

## 2014-08-06 MED ORDER — FENTANYL CITRATE 0.05 MG/ML IJ SOLN
INTRAMUSCULAR | Status: DC | PRN
  Administered 2014-08-06 (×2): 50 ug via INTRAVENOUS
  Administered 2014-08-06: 75 ug via INTRAVENOUS
  Administered 2014-08-06: 50 ug via INTRAVENOUS
  Administered 2014-08-06: 75 ug via INTRAVENOUS

## 2014-08-06 SURGICAL SUPPLY — 76 items
APL SKNCLS STERI-STRIP NONHPOA (GAUZE/BANDAGES/DRESSINGS) ×1
BENZOIN TINCTURE PRP APPL 2/3 (GAUZE/BANDAGES/DRESSINGS) ×3 IMPLANT
BIT DRILL SM SPINE QC 12 (BIT) ×2 IMPLANT
BNDG GAUZE ELAST 4 BULKY (GAUZE/BANDAGES/DRESSINGS) ×6 IMPLANT
BUR BARREL STRAIGHT FLUTE 4.0 (BURR) IMPLANT
BUR MATCHSTICK NEURO 3.0 LAGG (BURR) ×3 IMPLANT
CANISTER SUCT 3000ML (MISCELLANEOUS) ×3 IMPLANT
CLOSURE WOUND 1/2 X4 (GAUZE/BANDAGES/DRESSINGS) ×1
CONT SPEC 4OZ CLIKSEAL STRL BL (MISCELLANEOUS) ×3 IMPLANT
COVER MAYO STAND STRL (DRAPES) ×3 IMPLANT
DRAIN JACKSON PRT FLT 10 (DRAIN) ×2 IMPLANT
DRAPE C-ARM 42X72 X-RAY (DRAPES) ×6 IMPLANT
DRAPE LAPAROTOMY 100X72 PEDS (DRAPES) ×3 IMPLANT
DRAPE MICROSCOPE LEICA (MISCELLANEOUS) ×3 IMPLANT
DRAPE POUCH INSTRU U-SHP 10X18 (DRAPES) ×3 IMPLANT
DRAPE PROXIMA HALF (DRAPES) IMPLANT
DRSG OPSITE POSTOP 4X6 (GAUZE/BANDAGES/DRESSINGS) ×3 IMPLANT
DURAPREP 6ML APPLICATOR 50/CS (WOUND CARE) ×3 IMPLANT
ELECT REM PT RETURN 9FT ADLT (ELECTROSURGICAL) ×3
ELECTRODE REM PT RTRN 9FT ADLT (ELECTROSURGICAL) ×1 IMPLANT
EVACUATOR SILICONE 100CC (DRAIN) ×2 IMPLANT
GAUZE SPONGE 4X4 12PLY STRL (GAUZE/BANDAGES/DRESSINGS) ×3 IMPLANT
GAUZE SPONGE 4X4 16PLY XRAY LF (GAUZE/BANDAGES/DRESSINGS) IMPLANT
GLOVE BIO SURGEON STRL SZ 6.5 (GLOVE) IMPLANT
GLOVE BIO SURGEON STRL SZ7 (GLOVE) IMPLANT
GLOVE BIO SURGEON STRL SZ7.5 (GLOVE) IMPLANT
GLOVE BIO SURGEON STRL SZ8 (GLOVE) IMPLANT
GLOVE BIO SURGEON STRL SZ8.5 (GLOVE) IMPLANT
GLOVE BIO SURGEONS STRL SZ 6.5 (GLOVE)
GLOVE BIOGEL M 8.0 STRL (GLOVE) ×3 IMPLANT
GLOVE ECLIPSE 6.5 STRL STRAW (GLOVE) IMPLANT
GLOVE ECLIPSE 7.0 STRL STRAW (GLOVE) IMPLANT
GLOVE ECLIPSE 7.5 STRL STRAW (GLOVE) IMPLANT
GLOVE ECLIPSE 8.0 STRL XLNG CF (GLOVE) IMPLANT
GLOVE ECLIPSE 8.5 STRL (GLOVE) IMPLANT
GLOVE EXAM NITRILE LRG STRL (GLOVE) IMPLANT
GLOVE EXAM NITRILE MD LF STRL (GLOVE) IMPLANT
GLOVE EXAM NITRILE XL STR (GLOVE) IMPLANT
GLOVE EXAM NITRILE XS STR PU (GLOVE) IMPLANT
GLOVE INDICATOR 6.5 STRL GRN (GLOVE) IMPLANT
GLOVE INDICATOR 7.0 STRL GRN (GLOVE) IMPLANT
GLOVE INDICATOR 7.5 STRL GRN (GLOVE) IMPLANT
GLOVE INDICATOR 8.0 STRL GRN (GLOVE) IMPLANT
GLOVE INDICATOR 8.5 STRL (GLOVE) IMPLANT
GLOVE OPTIFIT SS 8.0 STRL (GLOVE) IMPLANT
GLOVE SURG SS PI 6.5 STRL IVOR (GLOVE) IMPLANT
GOWN STRL REUS W/ TWL LRG LVL3 (GOWN DISPOSABLE) ×1 IMPLANT
GOWN STRL REUS W/ TWL XL LVL3 (GOWN DISPOSABLE) IMPLANT
GOWN STRL REUS W/TWL 2XL LVL3 (GOWN DISPOSABLE) IMPLANT
GOWN STRL REUS W/TWL LRG LVL3 (GOWN DISPOSABLE) ×3
GOWN STRL REUS W/TWL XL LVL3 (GOWN DISPOSABLE)
HALTER HD/CHIN CERV TRACTION D (MISCELLANEOUS) ×3 IMPLANT
HEMOSTAT POWDER KIT SURGIFOAM (HEMOSTASIS) IMPLANT
KIT BASIN OR (CUSTOM PROCEDURE TRAY) ×3 IMPLANT
KIT ROOM TURNOVER OR (KITS) ×3 IMPLANT
NDL SPNL 22GX3.5 QUINCKE BK (NEEDLE) ×1 IMPLANT
NEEDLE SPNL 22GX3.5 QUINCKE BK (NEEDLE) ×3 IMPLANT
NS IRRIG 1000ML POUR BTL (IV SOLUTION) ×3 IMPLANT
PACK LAMINECTOMY NEURO (CUSTOM PROCEDURE TRAY) ×3 IMPLANT
PATTIES SURGICAL .5 X1 (DISPOSABLE) ×3 IMPLANT
PLATE ANT CERV XTEND 1 LV 14 (Plate) ×2 IMPLANT
PLATE ANT CERV XTEND 2 LV 30 (Plate) ×2 IMPLANT
PUTTY DBX 1CC (Putty) ×3 IMPLANT
PUTTY DBX 1CC DEPUY (Putty) IMPLANT
RUBBERBAND STERILE (MISCELLANEOUS) ×6 IMPLANT
SCREW XTD VAR 4.2 SELF TAP 12 (Screw) ×20 IMPLANT
SPACER ACDF SM LORDOTIC 7 (Spacer) ×4 IMPLANT
SPACER COLONIAL SZ 6-7 (Spacer) ×2 IMPLANT
SPONGE INTESTINAL PEANUT (DISPOSABLE) ×6 IMPLANT
SPONGE SURGIFOAM ABS GEL 100 (HEMOSTASIS) ×3 IMPLANT
STRIP CLOSURE SKIN 1/2X4 (GAUZE/BANDAGES/DRESSINGS) ×2 IMPLANT
SUT VIC AB 3-0 SH 8-18 (SUTURE) ×3 IMPLANT
SYR 20ML ECCENTRIC (SYRINGE) ×3 IMPLANT
TOWEL OR 17X24 6PK STRL BLUE (TOWEL DISPOSABLE) ×3 IMPLANT
TOWEL OR 17X26 10 PK STRL BLUE (TOWEL DISPOSABLE) ×3 IMPLANT
WATER STERILE IRR 1000ML POUR (IV SOLUTION) ×3 IMPLANT

## 2014-08-06 NOTE — Anesthesia Procedure Notes (Signed)
Procedure Name: Intubation Date/Time: 08/06/2014 9:38 AM Performed by: Gaylene Brooks Pre-anesthesia Checklist: Timeout performed, Patient identified, Emergency Drugs available, Suction available and Patient being monitored Patient Re-evaluated:Patient Re-evaluated prior to inductionOxygen Delivery Method: Circle system utilized Preoxygenation: Pre-oxygenation with 100% oxygen Intubation Type: IV induction Ventilation: Mask ventilation without difficulty Laryngoscope Size: Mac and 3 Grade View: Grade III Tube type: Oral Tube size: 7.0 mm Number of attempts: 3 Airway Equipment and Method: Bougie stylet Placement Confirmation: ETT inserted through vocal cords under direct vision,  breath sounds checked- equal and bilateral,  positive ETCO2 and CO2 detector Secured at: 22 cm Tube secured with: Tape Dental Injury: Teeth and Oropharynx as per pre-operative assessment  Difficulty Due To: Difficult Airway- due to anterior larynx and Difficult Airway- due to reduced neck mobility Comments: DL by crna with miller2, grade 3 view, unable to pass ETT. DL with MAC 3 by Dr Tamala Julian. ETT in esophagus. Immediately recognized and removed. 2nd DL by Dr. Tamala Julian with MAC 3, ETT placed through cords over bougie stylet. Teeth/lips as preop. Sat 100%.

## 2014-08-06 NOTE — Transfer of Care (Signed)
Immediate Anesthesia Transfer of Care Note  Patient: Kimberly [name]  Procedure(s) Performed: Procedure(s): ANTERIOR CERVICAL DECOMPRESSION/DISCECTOMY FUSION CERVICAL THREE-FOUR,CERVICAL SIX-SEVEN ,CERVICAL SEVEN-THORACIC ONE (N/A)  Patient Location: PACU  Anesthesia Type:General  Level of Consciousness: awake, alert  and oriented  Airway & Oxygen Therapy: Patient Spontanous Breathing and Patient connected to nasal cannula oxygen  Post-op Assessment: Report given to PACU RN, Post -op Vital signs reviewed and stable and Patient moving all extremities X 4  Post vital signs: Reviewed and stable  Complications: No apparent anesthesia complications

## 2014-08-06 NOTE — Anesthesia Preprocedure Evaluation (Addendum)
Anesthesia Evaluation  Patient identified by MRN, date of birth, ID band Patient awake    Reviewed: Allergy & Precautions, NPO status , Patient's Chart, lab work & pertinent test results  Airway Mallampati: I  TM Distance: >3 FB Neck ROM: Limited    Dental  (+) Teeth Intact, Dental Advisory Given   Pulmonary COPD         Cardiovascular hypertension,     Neuro/Psych  Neuromuscular disease    GI/Hepatic GERD-  ,  Endo/Other    Renal/GU      Musculoskeletal  (+) Arthritis -,   Abdominal   Peds  Hematology   Anesthesia Other Findings Herpes  Reproductive/Obstetrics                           Anesthesia Physical Anesthesia Plan  ASA: II  Anesthesia Plan: General   Post-op Pain Management:    Induction: Intravenous  Airway Management Planned: Oral ETT  Additional Equipment:   Intra-op Plan:   Post-operative Plan: Extubation in OR  Informed Consent: I have reviewed the patients History and Physical, chart, labs and discussed the procedure including the risks, benefits and alternatives for the proposed anesthesia with the patient or authorized representative who has indicated his/her understanding and acceptance.     Plan Discussed with: CRNA, Anesthesiologist and Surgeon  Anesthesia Plan Comments:         Anesthesia Quick Evaluation

## 2014-08-06 NOTE — Progress Notes (Signed)
Pt arrived to 4N16 at current time.  Pt A&O x 4, c/o 6/10 left anterior neck surgical pain, site covered with CDI honeycomb dressing, JP drain intact with 20 cc output.   Pt V/S taken, pt still on 2L O2 from PACU, fluids running at 75 cc/hr.  Pt voided in [place]. Pt without distress.  Family at the bedside. Diet ordered, will monitor.

## 2014-08-06 NOTE — Progress Notes (Signed)
Dr.Smith aware of tachycardia. No new orders and does not wish to treat.

## 2014-08-06 NOTE — Anesthesia Postprocedure Evaluation (Signed)
  Anesthesia Post-op Note  Patient: Kimberly [name]  Procedure(s) Performed: Procedure(s): ANTERIOR CERVICAL DECOMPRESSION/DISCECTOMY FUSION CERVICAL THREE-FOUR,CERVICAL SIX-SEVEN ,CERVICAL SEVEN-THORACIC ONE (N/A)  Patient Location: PACU  Anesthesia Type:General  Level of Consciousness: awake, alert , oriented and patient cooperative  Airway and Oxygen Therapy: Patient Spontanous Breathing  Post-op Pain: mild  Post-op Assessment: Post-op Vital signs reviewed, Patient's Cardiovascular Status Stable, Respiratory Function Stable, Patent Airway, No signs of Nausea or vomiting and Pain level controlled  Post-op Vital Signs: stable  Last Vitals:  Filed Vitals:   08/06/14 1309  BP: 130/87  Pulse: 94  Temp:   Resp: 22    Complications: No apparent anesthesia complications

## 2014-08-06 NOTE — Progress Notes (Signed)
Patient ID: Kimberly [name], female   DOB: [birth year], 54 y.o.   MRN: [id number] Stable, no weakness.

## 2014-08-07 NOTE — Progress Notes (Signed)
Patient ID: Kimberly [name], female   DOB: 1960/10/25, 54 y.o.   MRN: [id number] No pain in arms as preop. Some pain with swallowing. Minimal drainage. Wounds dry.dc in am?

## 2014-08-07 NOTE — Op Note (Signed)
NAMEGRICELDA, Kimberly [name]              ACCOUNT NO.:  [id number]  MEDICAL RECORD NO.:  [id number]  LOCATION:  4N16C                        FACILITY:  [facility]  PHYSICIAN:  Leeroy Cha, M.D.   DATE OF BIRTH:  22-Sep-1960  DATE OF PROCEDURE:  08/06/2014 DATE OF DISCHARGE:                              OPERATIVE REPORT   PREOPERATIVE DIAGNOSES:  C3-4 stenosis with radiculopathy, C6-7 and C7- T1 spondylosis with stenosis, and chronic radiculopathy.  POSTOPERATIVE DIAGNOSES:  C3-4 stenosis with radiculopathy, C6-7 and C7- T1 spondylosis with stenosis, and chronic radiculopathy.  PROCEDURE: 1. Anterior 3-4 diskectomy, decompression of the spinal cord and the     foramen, interbody fusion with cages, plate from C3 through C4. 2. Through a separate incision, anterior cervical diskectomy at C6-7     and C7-T1. 3. Decompression of the spinal cord. 4. Bilateral foraminotomy. 5. Interbody fusion with cages.  Plate from K8-J6.  Microscope.  SURGEON:  Leeroy Cha, M.D.  ASSISTANT:  Ophelia Charter, M.D.  CLINICAL HISTORY:  The patient had been seen in my office complaining of neck pain with radiation to both upper extremities.  Also back pain. She had failed with conservative treatment.  Myelogram showed that she has stenosis at the level of C3-C4 with stenosis of the foramen and also at the level C6-7 and C7-T1.  It is borderline between 4-5 and 5-6. Because she is not any better, she wanted to proceed with surgery.  I talked to her again yesterday and I gave the choice between doing a vertical incision or too horizontal.  We agreed at the level of horizontal because it will be more cosmetic.  PROCEDURE:  The patient was taken to the OR.  After intubation, the left side of the neck was prepped with DuraPrep.  Drapes were applied. Transverse incision in the upper part of the neck was done through the skin, subcutaneous tissue, straight down to the platysma, which was opened.  Dissection  was carried out all the way to the area between C3 and C4 because the patient has osteophyte.  She also has a congenital fusion of C2-C3.  Another incision was made in the lower part through the skin, subcutaneous tissue, platysma, straight down to the cervical spine.  We identified the area between C6-C7 because of the osteophyte. Nevertheless, x-rays showed that indeed we were at the level of C3-4 accounting with the congenital fusion of C2-C3 and the lower needle was at the level of C6-7.  We started at the level of C3-C4, we opened the anterior ligament after we removed the osteophyte.  We brought the microscope into the area.  The disk was quite calcified that we had to drill all the way down to the posterior ligament, which also was calcified.  Decompression using the 1 and 2 mm Kerrison punch of the spinal cord was achieved as well as the foramen to free the C4 nerve root.  Having good decompression, the endplates were drilled.  We introduced our cage of 6 mm height lordotic with DBX and autograft inside.  This was followed by a plate using 4 screws.  From then on, we went down to the C6-C7 where the anterior ligament of  this area was also calcified.  With the drill, we removed the disk and we went straight to the posterior ligament with decompression of the spinal cord as well as both C7 nerve roots.  The same procedure was done at the level of C7-T1 with decompression of the spinal cord and the C8 nerve roots.  The endplate at those 2 levels were drilled and we had to use 2 cages of 7 mm height with autograft and DBX inside followed by a plate using 6 screws.  Lateral cervical spine showed good position of the plate at the T8-U8 and we went to see the upper one at the level of C6-7.  Although, we achieved good hemostasis, because through the incision I left a drain in the operative site.  The wound was closed with different layers of Vicryl and Steri-Strips.  The patient went to  PACU.          ______________________________ Leeroy Cha, M.D.     EB/MEDQ  D:  08/06/2014  T:  08/07/2014  Job:  [id number]

## 2014-08-07 NOTE — Progress Notes (Signed)
Patient ID: Kimberly [name], female   DOB: [birth year], 54 y.o.   MRN: [id number] Vital signs are stable Patient has substantial hoarseness of voice and is having some modest difficulty with swallowing Drain has had small amount of output Incisions slightly puffy. 2 separate incisions noted. Patient notes that her husband receives dialysis today and generally does not feel well afterwards would prefer to stay until tomorrow Have encouraged ambulation and I believe observation of her airway status for an additional day is warranted given difficulties with swallowing and voice changes.

## 2014-08-07 NOTE — Progress Notes (Signed)
Physical Therapy Evaluation Patient Details Name: Kimberly [name] MRN: [id number] DOB: [birth year] Today's Date: 08/07/2014   History of Present Illness  Patient is a 54 yo female admitted 08/06/14 with neck pain moving into BUE's.  Patient s/p C3-4, C6-T1 ACDF.  PMH:  COPD, HTN, dyspnea, anxiety, arthritis, back/neck pain.  Clinical Impression  Patient presents with problems listed below.  Will benefit from acute PT to maximize independence prior to return home with husband.  Will need to complete stair training at next session.    Follow Up Recommendations No PT follow up;Supervision/Assistance - 24 hour    Equipment Recommendations  None recommended by PT    Recommendations for Other Services       Precautions / Restrictions Precautions Precautions: Cervical Precaution Comments: Reviewed cervical precautions with patient. Required Braces or Orthoses: Cervical Brace Cervical Brace: Soft collar Restrictions Weight Bearing Restrictions: No      Mobility  Bed Mobility Overal bed mobility: Needs Assistance Bed Mobility: Rolling;Sidelying to Sit Rolling: Supervision Sidelying to sit: Supervision       General bed mobility comments: Verbal cues for technique and for cervical precautions.  Supervision only for safety.  Transfers Overall transfer level: Needs assistance Equipment used: None Transfers: Sit to/from Stand Sit to Stand: Supervision         General transfer comment: Verbal cues for hand placement and technique.  Assist for safety/balance only.  Ambulation/Gait Ambulation/Gait assistance: Min guard Ambulation Distance (Feet): 140 Feet Assistive device: None Gait Pattern/deviations: Step-through pattern;Decreased stride length Gait velocity: Decreased Gait velocity interpretation: Below normal speed for age/gender General Gait Details: Patient with slow, guarded gait.  Maintains head in midline position during gait.  Turns body when looking at objects or  people to her side.  Slightly unsteady gait - able to self-correct.  Assist for safety.  Stairs            Wheelchair Mobility    Modified Rankin (Stroke Patients Only)       Balance                                             Pertinent Vitals/Pain Pain Assessment: 0-10 Pain Score: 5  Pain Location: Neck Pain Descriptors / Indicators: Aching Pain Intervention(s): Monitored during session    Home Living Family/patient expects to be discharged to:: Private residence Living Arrangements: Spouse/significant other Available Help at Discharge: Family;Available 24 hours/day Type of Home: House Home Access: Stairs to enter Entrance Stairs-Rails: None Entrance Stairs-Number of Steps: 3 Home Layout: One level Home Equipment: Bedside commode      Prior Function Level of Independence: Independent;Needs assistance      ADL's / Homemaking Assistance Needed: Patient reports needing assist with dressing for shirts that go over her head.        Hand Dominance   Dominant Hand: Right    Extremity/Trunk Assessment   Upper Extremity Assessment: Generalized weakness           Lower Extremity Assessment: Generalized weakness         Communication   Communication: No difficulties  Cognition Arousal/Alertness: Awake/alert Behavior During Therapy: WFL for tasks assessed/performed Overall Cognitive Status: Within Functional Limits for tasks assessed                      General Comments      Exercises  Assessment/Plan    PT Assessment Patient needs continued PT services  PT Diagnosis Abnormality of gait;Generalized weakness;Acute pain   PT Problem List Decreased strength;Decreased activity tolerance;Decreased balance;Decreased mobility;Decreased knowledge of precautions;Pain  PT Treatment Interventions Gait training;Stair training;Functional mobility training;Therapeutic activities;Patient/family education   PT Goals  (Current goals can be found in the Care Plan section) Acute Rehab PT Goals Patient Stated Goal: To return home PT Goal Formulation: With patient Time For Goal Achievement: 08/14/14 Potential to Achieve Goals: Good    Frequency Min 5X/week   Barriers to discharge        Co-evaluation               End of Session Equipment Utilized During Treatment: Gait belt;Cervical collar Activity Tolerance: Patient limited by fatigue;Patient limited by pain Patient left: in chair;with call bell/phone within reach Nurse Communication: Mobility status         Time: 1761-6073 PT Time Calculation (min) (ACUTE ONLY): 14 min   Charges:   PT Evaluation $Initial PT Evaluation Tier I: 1 Procedure PT Treatments $Gait Training: 8-22 mins   PT G CodesDespina Pole 08-24-2014, 12:42 PM Carita Pian. Sanjuana Kava, [place] Pager [phone]

## 2014-08-07 NOTE — Evaluation (Signed)
Occupational Therapy Evaluation Patient Details Name: Kimberly [name] MRN: [id number] DOB: [birth year] Today's Date: 08/07/2014    History of Present Illness Patient is a 54 yo female admitted 08/06/14 with neck pain moving into BUE's.  Patient s/p C3-4, C6-T1 ACDF.  PMH:  COPD, HTN, dyspnea, anxiety, arthritis, back/neck pain.   Clinical Impression   Pt was needing assist for overhead shirt donning prior to admission, but otherwise independent.  Pt presents with some mild balance deficits and generalized weakness.  Supervision required for mobility for safety and to adhere to precautions.  Provided handout and instructed pt in precautions related to ADL and IADL, pt verbalized understanding of all.  Pt will have husband to assist her except when he is at dialysis. No further OT needs.    Follow Up Recommendations  No OT follow up;Supervision - Intermittent    Equipment Recommendations  None recommended by OT    Recommendations for Other Services       Precautions / Restrictions Precautions Precautions: Cervical;Fall Precaution Comments: Reviewed cervical precautions with patient, provided handout Required Braces or Orthoses: Cervical Brace Cervical Brace: Soft collar Restrictions Weight Bearing Restrictions: No      Mobility Bed Mobility     General bed mobility comments: pt up in chair  Transfers Overall transfer level: Needs assistance Equipment used: None Transfers: Sit to/from Stand Sit to Stand: Supervision         General transfer comment: no physical assist, supervision for balance/safety    Balance                                            ADL Overall ADL's : Needs assistance/impaired Eating/Feeding: Independent;Sitting   Grooming: Wash/dry hands;Supervision/safety Grooming Details (indicate cue type and reason): instructed in using 2 cups for toothbrushing Upper Body Bathing: Set up;Sitting   Lower Body Bathing: Supervison/  safety;Sit to/from stand   Upper Body Dressing : Set up;Sitting Upper Body Dressing Details (indicate cue type and reason): front opening gown Lower Body Dressing: Supervision/safety;Sit to/from stand Lower Body Dressing Details (indicate cue type and reason): able to cross foot over opposite knee to donn socks Toilet Transfer: Min guard;Ambulation;Comfort height toilet;Grab bars   Toileting- Clothing Manipulation and Hygiene: Supervision/safety;Sit to/from stand Toileting - Clothing Manipulation Details (indicate cue type and reason): instructed to avoid twisting  with pericare     Functional mobility during ADLs: Min guard General ADL Comments: Instructed in cervical precautions related to IADL. Husband will be performing meal prep and housekeeping.     Vision                     Perception     Praxis      Pertinent Vitals/Pain Pain Assessment: Faces Pain Score: 5  Faces Pain Scale: Hurts little more Pain Location: neck Pain Descriptors / Indicators: Aching Pain Intervention(s): Limited activity within patient's tolerance;Monitored during session;Repositioned;Premedicated before session     Hand Dominance Right   Extremity/Trunk Assessment Upper Extremity Assessment Upper Extremity Assessment: Generalized weakness (FF to 90 degrees, hx of carpal tunnel sx B)   Lower Extremity Assessment Lower Extremity Assessment: Defer to PT evaluation       Communication Communication Communication: No difficulties   Cognition Arousal/Alertness: Awake/alert Behavior During Therapy: WFL for tasks assessed/performed Overall Cognitive Status: Within Functional Limits for tasks assessed  General Comments       Exercises       Shoulder Instructions      Home Living Family/patient expects to be discharged to:: Private residence Living Arrangements: Spouse/significant other Available Help at Discharge: Family;Available PRN/intermittently  (husband has HD 3 days a week) Type of Home: House Home Access: Stairs to enter [employer] of Steps: 3 Entrance Stairs-Rails: None Home Layout: One level     Bathroom Shower/Tub: Teacher, early years/pre: Standard     Home Equipment: Bedside commode          Prior Functioning/Environment Level of Independence: Independent;Needs assistance    ADL's / Homemaking Assistance Needed: Patient reports needing assist with dressing for shirts that go over her head.        OT Diagnosis:     OT Problem List:     OT Treatment/Interventions:      OT Goals(Current goals can be found in the care plan section) Acute Rehab OT Goals Patient Stated Goal: To return home  OT Frequency:     Barriers to D/C:            Co-evaluation              End of Session Equipment Utilized During Treatment: Gait belt;Cervical collar  Activity Tolerance: Patient tolerated treatment well Patient left: in chair;with call bell/phone within reach   Time: 1225-1244 OT Time Calculation (min): 19 min Charges:  OT General Charges $OT Visit: 1 Procedure OT Evaluation $Initial OT Evaluation Tier I: 1 Procedure OT Treatments $Self Care/Home Management : 8-22 mins G-Codes:    Malka So 08/07/2014, 12:58 PM

## 2014-08-08 NOTE — Progress Notes (Signed)
Physical Therapy Treatment Patient Details Name: Kimberly [name] MRN: [id number] DOB: [birth year] Today's Date: 08/08/2014    History of Present Illness Patient is a 54 yo female admitted 08/06/14 with neck pain moving into BUE's.  Patient s/p C3-4, C6-T1 ACDF.  PMH:  COPD, HTN, dyspnea, anxiety, arthritis, back/neck pain.    PT Comments    Good participation and steady progress with functional mobility; on track for dc home tomorrow  Follow Up Recommendations  No PT follow up;Supervision/Assistance - 24 hour     Equipment Recommendations  None recommended by PT    Recommendations for Other Services       Precautions / Restrictions Precautions Precautions: Cervical;Fall Precaution Comments: Reviewed cervical precautions with patient, provided handout Required Braces or Orthoses: Cervical Brace Cervical Brace: Soft collar    Mobility  Bed Mobility Overal bed mobility: Needs Assistance Bed Mobility: Rolling;Sidelying to Sit Rolling: Supervision         General bed mobility comments: Verbal cues for technique and for cervical precautions.  Supervision only for safety.  Transfers Overall transfer level: Needs assistance Equipment used: None Transfers: Sit to/from Stand Sit to Stand: Supervision         General transfer comment: no physical assist, supervision for balance/safety  Ambulation/Gait Ambulation/Gait assistance: Min guard;Supervision Ambulation Distance (Feet): 120 Feet Assistive device: None Gait Pattern/deviations: Step-through pattern Gait velocity: Decreased Gait velocity interpretation: Below normal speed for age/gender General Gait Details: Patient with slow, guarded gait.  Maintains head in midline position during gait.  Turns body when looking at objects or people to her side.  Slightly unsteady gait - able to self-correct.  Assist for safety.   Stairs Stairs: Yes Stairs assistance: Min assist Stair Management: No rails;Forwards;Step to  pattern Number of Stairs: 6 General stair comments: Cues for technique; gave pt multiple options for hand holds/support that her husband can give going up the steps  Wheelchair Mobility    Modified Rankin (Stroke Patients Only)       Balance                                    Cognition Arousal/Alertness: Awake/alert Behavior During Therapy: WFL for tasks assessed/performed Overall Cognitive Status: Within Functional Limits for tasks assessed                      Exercises      General Comments        Pertinent Vitals/Pain Pain Assessment: 0-10 Pain Score: 3  Pain Location: sore throat Pain Descriptors / Indicators: Aching Pain Intervention(s): Monitored during session    Home Living                      Prior Function            PT Goals (current goals can now be found in the care plan section) Acute Rehab PT Goals Patient Stated Goal: To return home PT Goal Formulation: With patient Time For Goal Achievement: 08/14/14 Potential to Achieve Goals: Good Progress towards PT goals: Progressing toward goals    Frequency  Min 5X/week    PT Plan Current plan remains appropriate    Co-evaluation             End of Session Equipment Utilized During Treatment: Cervical collar Activity Tolerance: Patient tolerated treatment well Patient left: in chair;with call bell/phone within reach  Time: 4536-4680 PT Time Calculation (min) (ACUTE ONLY): 14 min  Charges:  $Gait Training: 8-22 mins                    G Codes:      Quin Hoop 08/08/2014, 5:12 PM  Roney Marion, [place] Pager [phone] Office [phone]

## 2014-08-08 NOTE — Progress Notes (Signed)
Utilization Review Completed.   Imaad Reuss, RN, BSN Nurse Case Manager  

## 2014-08-08 NOTE — Progress Notes (Signed)
Patient ID: Kimberly [name], female   DOB: 12-02-1960, 55 y.o.   MRN: [id number] Subjective:  The patient is alert and pleasant. She does not want to go home today.  Objective: Vital signs in last 24 hours: Temp:  [98.2 F (36.8 C)-99.9 F (37.7 C)] 98.4 F (36.9 C) (01/10 1017) Pulse Rate:  [100-121] 110 (01/10 1017) Resp:  [20-22] 20 (01/10 1017) BP: (129-147)/(82-99) 131/91 mmHg (01/10 1017) SpO2:  [93 %-95 %] 93 % (01/10 1017)  Intake/Output from previous day: 01/09 0701 - 01/10 0700 In: 3 [I.V.:3] Out: -  Intake/Output this shift:    Physical exam patient is alert and oriented. She is moving all 4 extremities well. Her drain has been removed. Her wound is flat without evidence of hematoma or shift.  Lab Results: No results for input(s): WBC, HGB, HCT, PLT in the last 72 hours. BMET No results for input(s): NA, K, CL, CO2, GLUCOSE, BUN, CREATININE, CALCIUM in the last 72 hours.  Studies/Results: Dg Cervical Spine 1 View  08/06/2014   CLINICAL DATA:  Cervical spondylosis without myelopathy. Herniated nucleus pulposis.  EXAM: DG CERVICAL SPINE - 1 VIEW  COMPARISON:  MRI dated 12/25/2013  FINDINGS: Single lateral portable view demonstrates the patient has undergone anterior cervical fusion at C3-4 and C6-T1. Plates and interbody fusion devices and screws are in place. Alignment is anatomic. 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  IMPRESSION: Anterior cervical fusions at C3-4 and C6-T1.   Electronically Signed   By: Rozetta Nunnery M.D.   On: 08/06/2014 15:46    Assessment/Plan: Postop day #2: The patient does not feel she is ready to go home today. We will plan to send her home tomorrow.  LOS: 2 days     Kimberly [name] D 08/08/2014, 11:44 AM

## 2014-08-09 NOTE — Progress Notes (Signed)
D/C orders received, pt for D/C home today.  IV and telemetry D/C.  Rx and D/C instructions given with verbalized understanding.  Family at bedside to assist with D/C.  Staff brought pt downstairs via wheelchair.  

## 2014-08-09 NOTE — Discharge Summary (Signed)
Physician Discharge Summary  Patient ID: Kimberly [name] MRN: [id number] DOB/AGE: [birth year] 54 y.o.  Admit date: 08/06/2014 Discharge date: 08/09/2014  Admission Diagnoses:cervical 3-4. 6-7,7t1 stenosis  Discharge Diagnoses:  Active Problems:   Cervical stenosis of spinal canal   Discharged Condition:no pain  Hospital Course:surgery  Consults: none  Significant Diagnostic Studies: myelogram,  Treatments:ACDF  Discharge Exam: Blood pressure 146/93, pulse 97, temperature 98.4 F (36.9 C), temperature source Oral, resp. rate 20, height 5' (1.524 m), weight 67.5 kg (148 lb 13 oz), SpO2 92 %. NP PAIN, NO WEAKNESS AS [REOP  Disposition: 01-Home or Self Care  home    Medication List    TAKE these medications        ALPRAZolam 0.5 MG tablet  Commonly known as:  XANAX  Take 0.5 mg by mouth at bedtime as needed for anxiety.     amLODipine 5 MG tablet  Commonly known as:  NORVASC  Take 5 mg by mouth daily with breakfast.     aspirin 325 MG EC tablet  Take 325 mg by mouth daily.     budesonide-formoterol 160-4.5 MCG/ACT inhaler  Commonly known as:  SYMBICORT  Inhale 2 puffs into the lungs 2 (two) times daily.     estradiol 1 MG tablet  Commonly known as:  ESTRACE  Take 1 mg by mouth daily with breakfast.     fluconazole 150 MG tablet  Commonly known as:  DIFLUCAN  Take 1 tablet (150 mg total) by mouth once.     gabapentin 100 MG capsule  Commonly known as:  NEURONTIN  2 tabs every 8 hours     gabapentin 300 MG capsule  Commonly known as:  NEURONTIN  Take 1 capsule (300 mg total) by mouth 3 (three) times daily.     HYDROcodone-acetaminophen 5-325 MG per tablet  Commonly known as:  NORCO/VICODIN  Take 1 tablet by mouth every 6 (six) hours as needed for moderate pain.     ibuprofen 200 MG tablet  Commonly known as:  ADVIL,MOTRIN  Take 2 tablets (400 mg total) by mouth every 6 (six) hours as needed for moderate pain.     methocarbamol 500 MG tablet   Commonly known as:  ROBAXIN  Take 1 tablet (500 mg total) by mouth 3 (three) times daily.     oxyCODONE-acetaminophen 5-325 MG per tablet  Commonly known as:  PERCOCET/ROXICET  Take 1 tablet by mouth every 4 (four) hours as needed for severe pain.     oxyCODONE-acetaminophen 5-325 MG per tablet  Commonly known as:  PERCOCET/ROXICET  Take 1 tablet by mouth every 4 (four) hours as needed.     traMADol-acetaminophen 37.5-325 MG per tablet  Commonly known as:  ULTRACET  Take 1 tablet by mouth every 4 (four) hours as needed.     valACYclovir 500 MG tablet  Commonly known as:  VALTREX  Take 400 mg by mouth daily with breakfast.         Signed: Ashla Murph M 08/09/2014, 1:18 PM

## 2014-08-10 ENCOUNTER — Encounter (HOSPITAL_COMMUNITY): Payer: Self-pay | Admitting: Neurosurgery

## 2014-09-12 ENCOUNTER — Encounter (HOSPITAL_COMMUNITY): Payer: Self-pay | Admitting: Emergency Medicine

## 2014-09-12 ENCOUNTER — Emergency Department (HOSPITAL_COMMUNITY)
Admission: EM | Admit: 2014-09-12 | Discharge: 2014-09-12 | Disposition: A | Discharge status: Home / Self Care | Payer: 59 | Attending: Emergency Medicine | Admitting: Emergency Medicine

## 2014-09-12 ENCOUNTER — Emergency Department (HOSPITAL_COMMUNITY): Payer: 59

## 2014-09-12 DIAGNOSIS — W010XXA Fall on same level from slipping, tripping and stumbling without subsequent striking against object, initial encounter: Secondary | ICD-10-CM | POA: Insufficient documentation

## 2014-09-12 DIAGNOSIS — Y9289 Other specified places as the place of occurrence of the external cause: Secondary | ICD-10-CM | POA: Diagnosis not present

## 2014-09-12 DIAGNOSIS — Z79899 Other long term (current) drug therapy: Secondary | ICD-10-CM | POA: Insufficient documentation

## 2014-09-12 DIAGNOSIS — K219 Gastro-esophageal reflux disease without esophagitis: Secondary | ICD-10-CM | POA: Diagnosis not present

## 2014-09-12 DIAGNOSIS — Z7982 Long term (current) use of aspirin: Secondary | ICD-10-CM | POA: Diagnosis not present

## 2014-09-12 DIAGNOSIS — J441 Chronic obstructive pulmonary disease with (acute) exacerbation: Secondary | ICD-10-CM | POA: Insufficient documentation

## 2014-09-12 DIAGNOSIS — F419 Anxiety disorder, unspecified: Secondary | ICD-10-CM | POA: Insufficient documentation

## 2014-09-12 DIAGNOSIS — I10 Essential (primary) hypertension: Secondary | ICD-10-CM | POA: Insufficient documentation

## 2014-09-12 DIAGNOSIS — M1990 Unspecified osteoarthritis, unspecified site: Secondary | ICD-10-CM | POA: Insufficient documentation

## 2014-09-12 DIAGNOSIS — Z7951 Long term (current) use of inhaled steroids: Secondary | ICD-10-CM | POA: Insufficient documentation

## 2014-09-12 DIAGNOSIS — Y998 Other external cause status: Secondary | ICD-10-CM | POA: Insufficient documentation

## 2014-09-12 DIAGNOSIS — S4992XA Unspecified injury of left shoulder and upper arm, initial encounter: Secondary | ICD-10-CM | POA: Diagnosis present

## 2014-09-12 DIAGNOSIS — Y9389 Activity, other specified: Secondary | ICD-10-CM | POA: Diagnosis not present

## 2014-09-12 MED ORDER — METHOCARBAMOL 500 MG PO TABS: 500.0000 mg | ORAL_TABLET | Freq: Two times a day (BID) | ORAL | Status: DC

## 2014-09-12 MED ORDER — DICLOFENAC SODIUM 50 MG PO TBEC: 50.0000 mg | DELAYED_RELEASE_TABLET | Freq: Two times a day (BID) | ORAL | Status: DC

## 2014-09-12 NOTE — ED Provider Notes (Signed)
CSN: [id number]     Arrival date & time 09/12/14  1031 History  This chart was scribed for non-physician practitioner, Debroah Baller, NP, working with Nat Christen, MD, by Peyton Bottoms ED Scribe. This patient was seen in room APFT24/APFT24 and the patient's care was started at 11:43 AM   Chief Complaint  Patient presents with  . Shoulder Pain   Patient is a 54 y.o. female presenting with shoulder pain. The history is provided by the patient. No language interpreter was used.  Shoulder Pain Location:  Shoulder Injury: yes   Mechanism of injury: fall   Fall:    Fall occurred:  Tripped   Impact surface:  [employer] of impact: left shoulder.   Entrapped after fall: no   Shoulder location:  L shoulder Pain details:    Quality:  Aching   Severity:  Moderate   Onset quality:  Sudden   Duration:  2 months   Timing:  Constant   Progression:  Unchanged Chronicity:  New Dislocation: no   Foreign body present:  No foreign bodies Relieved by:  Nothing Worsened by:  Movement Ineffective treatments: oxycodone.   HPI Comments: Kimberly [name] is a 54 y.o. female with a PMHx of rotator cuff surgery to left shoulder, COPD, hypertension, GERD and arthritis, who presents to the Emergency Department complaining of moderate left shoulder pain that began in November 2015 when patient tripped over her shoelaces and fell onto her treadmill on Thanksgiving day. Patient states that she was seen by PCP Dr. Gerarda Fraction who removed a cyst from her chest. She mentioned the shoulder pain but his main concern that day was the cyst. She did see Dr. Joya Salm later who did her Cervical spine fusion and mentioned it to him and he said he would follow up on it.  The Percocet she is taking for her neck pain does not seem to help the shoulder. She is planning to follow up with Dr. Aline Brochure who did her Rotator Cuff surgery but came today due to continued pain.     Past Medical History  Diagnosis Date  . COPD (chronic  obstructive pulmonary disease)   . Herpes   . Hypertension     pt. doesn't see cardiologist, followed for HTN by Dr. Gerarda Fraction  . Shortness of breath dyspnea   . Anxiety     takes alprazolam - rare use   . GERD (gastroesophageal reflux disease)     pt. reports that its better, no meds in use at this time- 2015  . Arthritis     cervical spondylosis    Past Surgical History  Procedure Laterality Date  . Abdominal hysterectomy    . Rotator cuff repair Left   . Foreign body removal Left     knee-as child  . Carpal tunnel release Left 02/16/2013    Procedure: CARPAL TUNNEL RELEASE;  Surgeon: Carole Civil, MD;  Location: [facility];  Service: Orthopedics;  Laterality: Left;  . Carpal tunnel release Right 03/27/2013    Procedure: RIGHT CARPAL TUNNEL RELEASE;  Surgeon: Carole Civil, MD;  Location: [facility];  Service: Orthopedics;  Laterality: Right;  . Cesarean section      x2  . Anterior cervical decomp/discectomy fusion N/A 08/06/2014    Procedure: ANTERIOR CERVICAL DECOMPRESSION/DISCECTOMY FUSION CERVICAL THREE-FOUR,CERVICAL SIX-SEVEN ,CERVICAL SEVEN-THORACIC ONE;  Surgeon: Floyce Stakes, MD;  Location: [place];  Service: Neurosurgery;  Laterality: N/A;   History reviewed. No pertinent family history. History  Substance Use Topics  .  Smoking status: Never Smoker   . Smokeless tobacco: Not on file  . Alcohol Use: No   OB History    No data available     Review of Systems  Musculoskeletal: Positive for arthralgias.       Left shoulder pain due to injury.  Cervical spine tenderness s/p surgery.   All other systems reviewed and are negative. All other systems negative  Allergies  Peanut-containing drug products; Shellfish allergy; and Codeine  Home Medications   Prior to Admission medications   Medication Sig Start Date End Date Taking? Authorizing Provider  ALPRAZolam Duanne Moron) 0.5 MG tablet Take 0.5 mg by mouth at bedtime as needed for anxiety.   Yes Historical Provider, MD   amLODipine (NORVASC) 5 MG tablet Take 5 mg by mouth daily with breakfast.    Yes Historical Provider, MD  aspirin 325 MG EC tablet Take 325 mg by mouth daily.   Yes Historical Provider, MD  budesonide-formoterol (SYMBICORT) 160-4.5 MCG/ACT inhaler Inhale 2 puffs into the lungs 2 (two) times daily.   Yes Historical Provider, MD  estradiol (ESTRACE) 1 MG tablet Take 1 mg by mouth daily with breakfast.    Yes Historical Provider, MD  gabapentin (NEURONTIN) 300 MG capsule Take 1 capsule (300 mg total) by mouth 3 (three) times daily. Patient taking differently: Take 300 mg by mouth 3 (three) times daily as needed. Pain. 01/05/14  Yes Carole Civil, MD  oxyCODONE-acetaminophen (PERCOCET/ROXICET) 5-325 MG per tablet Take 1 tablet by mouth every 4 (four) hours as needed. Patient taking differently: Take 1 tablet by mouth every 8 (eight) hours as needed for moderate pain.  08/26/76  Yes Delora Fuel, MD  traMADol-acetaminophen (ULTRACET) 37.5-325 MG per tablet Take 1 tablet by mouth every 4 (four) hours as needed for moderate pain.    Yes Historical Provider, MD  valACYclovir (VALTREX) 500 MG tablet Take 400 mg by mouth daily with breakfast.    Yes Historical Provider, MD  diclofenac (VOLTAREN) 50 MG EC tablet Take 1 tablet (50 mg total) by mouth 2 (two) times daily. 09/12/14   [place], NP  fluconazole (DIFLUCAN) 150 MG tablet Take 1 tablet (150 mg total) by mouth once. Patient not taking: Reported on 07/05/2014 6/76/72   Delora Fuel, MD  methocarbamol (ROBAXIN) 500 MG tablet Take 1 tablet (500 mg total) by mouth 2 (two) times daily. 09/12/14   Olufemi Mofield Bunnie Pion, NP   Triage Vitals: BP 128/91 mmHg  Pulse 95  Temp(Src) 98.1 F (36.7 C) (Oral)  Resp 18  Ht 5\' 1"  (1.549 m)  Wt 147 lb (66.679 kg)  BMI 27.79 kg/m2  SpO2 100%  Physical Exam  Constitutional: She is oriented to person, place, and time. She appears well-developed and well-nourished.  Non-toxic appearance. She does not appear ill. No  distress.  HENT:  Head: Normocephalic and atraumatic.  Right Ear: External ear normal.  Left Ear: External ear normal.  Nose: Nose normal.  Eyes: Conjunctivae and EOM are normal.  Neck: Normal range of motion and full passive range of motion without pain. Neck supple.  Cardiovascular: Normal rate and regular rhythm.   Pulmonary/Chest: Effort normal and breath sounds normal. She exhibits no crepitus.  Musculoskeletal: She exhibits tenderness. She exhibits no edema.       Right shoulder: Decreased range of motion: due to pain.       Left shoulder: She exhibits tenderness. She exhibits no swelling, no crepitus, no deformity, no laceration, normal pulse and normal strength. Decreased  range of motion: due to pain.  Strong radial pulse. Adequate circulation. No pain to left wrist or left elbow. Pain with any range of motion of the left shoulder. The pain is located in the anterior aspect of shoulder over rotator cuff.  Neurological: She is alert and oriented to person, place, and time. She has normal strength. No cranial nerve deficit.  Skin: Skin is warm, dry and intact.  Psychiatric: She has a normal mood and affect. Her speech is normal and behavior is normal. Her mood appears not anxious.  Nursing note and vitals reviewed.  ED Course  Procedures (including critical care time)  DIAGNOSTIC STUDIES: Oxygen Saturation is 100% on RA, normal by my interpretation.    COORDINATION OF CARE: 11:47 AM- Ordered diagnostic imaging of left shoulder. Pt advised of plan for treatment and pt agrees.  Labs Review Dg Shoulder Left  09/12/2014   CLINICAL DATA:  Fall several months ago. Left shoulder pain. Initial encounter.  EXAM: LEFT SHOULDER - 2+ VIEW  COMPARISON:  None.  FINDINGS: There is no evidence of fracture or dislocation. Mild degenerative spurring seen involving the acromioclavicular joint. A surgical tack is seen in the humeral head at the rotator cuff insertion site.  IMPRESSION: No acute  findings.  Mild acromioclavicular degenerative changes.   Electronically Signed   By: Earle Gell M.D.   On: 09/12/2014 12:43    MDM  54 y.o. female with left should pain s/p fall in November. Will treat for pain, arm sling applied with instructions regarding range of motion of the shoulder. She will apply ice and rest the area and follow up with DR. Aline Brochure. She will return here as needed. Stable for d/c without neurovascular deficits.  Final diagnoses:  Shoulder injury, left, initial encounter   I personally performed the services described in this documentation, which was scribed in my presence. The recorded information has been reviewed and is accurate.   [place], NP 09/12/14 [place], MD 09/14/14 2153

## 2014-09-12 NOTE — ED Notes (Addendum)
Pt reports left shoulder pain. Pt reports increased pain with movement. Pt reports fell on left shoulder on thanksgiving day. Pt reports pain ever since. nad noted.distal pulses intact. Cap refill wnl.

## 2014-09-12 NOTE — Discharge Instructions (Signed)
Follow up with Dr. Aline Brochure, return here as needed.

## 2014-09-14 ENCOUNTER — Telehealth: Payer: Self-pay | Admitting: Orthopedic Surgery

## 2014-09-14 NOTE — Telephone Encounter (Signed)
Per our Environmental education officer, Remo Lipps, call was received from patient 09/13/14; patient requests appointment with Dr Aline Brochure for shoulder pain, which she states had been increasing since occurrence of a fall during Thanksgiving holiday 2015, for which she was treated at [facility] Emergency [facility].  She states that an MRI was done.   (Chart Notes indicate patient is also treating under Dr Joya Salm at [facility], to whom our office had referred in 2015) Per Dr Aline Brochure, he will review the report and film, and advise regarding appointment.

## 2014-09-14 NOTE — Telephone Encounter (Signed)
I do not see an MRI of the shoulder please get a copy of that report and let me see it prior to scheduling an appointment

## 2014-09-15 NOTE — Telephone Encounter (Signed)
Ok   See her 89 feb

## 2014-09-15 NOTE — Telephone Encounter (Signed)
I called back to patient; states she understood it was an MRI she had done at [facility] on 09/12/14, however, appears to be Xray only per chart.  Dr Aline Brochure - please review Xray.  Patient aware appointment pending Dr Ruthe Mannan review and also referral from primary care and per [employer].

## 2014-09-16 NOTE — Telephone Encounter (Signed)
Called back to patient upon receiving response from primary care, [facility] -- they relay that patient will need to be seen there first, prior to issuing of referral.  Appointment for 09/23/14 cancelled, pending referral.  Re-schedule upon receipt of [place] referral which primary care will initiate after patient is seen.

## 2014-09-23 ENCOUNTER — Ambulatory Visit: Payer: 59 | Admitting: Orthopedic Surgery

## 2014-10-11 ENCOUNTER — Ambulatory Visit (HOSPITAL_COMMUNITY)
Admission: RE | Admit: 2014-10-11 | Discharge: 2014-10-11 | Disposition: A | Discharge status: Home / Self Care | Payer: 59 | Source: Ambulatory Visit | Attending: Family Medicine | Admitting: Family Medicine

## 2014-10-11 ENCOUNTER — Other Ambulatory Visit (HOSPITAL_COMMUNITY): Payer: Self-pay | Admitting: Family Medicine

## 2014-10-11 DIAGNOSIS — W19XXXA Unspecified fall, initial encounter: Secondary | ICD-10-CM | POA: Insufficient documentation

## 2014-10-11 DIAGNOSIS — R937 Abnormal findings on diagnostic imaging of other parts of musculoskeletal system: Secondary | ICD-10-CM | POA: Insufficient documentation

## 2014-10-11 DIAGNOSIS — M25511 Pain in right shoulder: Secondary | ICD-10-CM | POA: Diagnosis not present

## 2014-10-18 ENCOUNTER — Encounter: Payer: Self-pay | Admitting: Orthopedic Surgery

## 2014-10-18 ENCOUNTER — Ambulatory Visit (INDEPENDENT_AMBULATORY_CARE_PROVIDER_SITE_OTHER): Payer: 59 | Admitting: Orthopedic Surgery

## 2014-10-18 VITALS — BP 147/98 | Ht 61.0 in | Wt 147.0 lb

## 2014-10-18 DIAGNOSIS — M7552 Bursitis of left shoulder: Secondary | ICD-10-CM

## 2014-10-18 DIAGNOSIS — S40011A Contusion of right shoulder, initial encounter: Secondary | ICD-10-CM | POA: Diagnosis not present

## 2014-10-18 NOTE — Progress Notes (Signed)
Patient ID: Kimberly [name], female   DOB: 05-31-1961, 54 y.o.   MRN: [id number]  Chief Complaint  Patient presents with  . Shoulder Pain    bilateral shoulder pain, injured Left 11/15 + Right 3/16. REFER. Kimberly [name] is a 54 y.o. female.   HPI This 54 year old female recently had cervical spine surgery January 2016 still in physical therapy recovering from that presents with several complaints today  I will address them separately  #1 she has a documented fall and a [employer] recently sustained a contusion to her right shoulder when she slipped in the bathtub. The x-rays of her shoulder I have reviewed show before meals joint arthritis which is obviously not acute and no fracture in the shoulder and her clinical exam today shows no evidence of rotator cuff tear although she has bruising tenderness stiffness and loss of motion and pain  As far as the left shoulder goes she fell injuring her left shoulder back in November 2015 she also had shoulder surgery on that same arm prior to that details below she is complaining of pain stiffness loss of motion and aching in the anterior aspect of the left shoulder and she has lost some of her range of motion but no weakness Review of Systems Positive findings for system review include weight loss fatigue sore throat vision problems in the left eye shortness of breath breathing issues history of COPD abdominal pain on the right depression anxiety food allergies muscle weakness stiff joints swollen joints back pain food allergy.    Past Medical History  Diagnosis Date  . COPD (chronic obstructive pulmonary disease)   . Herpes   . Hypertension     pt. doesn't see cardiologist, followed for HTN by Dr. Gerarda Fraction  . Shortness of breath dyspnea   . Anxiety     takes alprazolam - rare use   . GERD (gastroesophageal reflux disease)     pt. reports that its better, no meds in use at this time- 2015  . Arthritis     cervical spondylosis      Past Surgical History  Procedure Laterality Date  . Abdominal hysterectomy    . Rotator cuff repair Left   . Foreign body removal Left     knee-as child  . Carpal tunnel release Left 02/16/2013    Procedure: CARPAL TUNNEL RELEASE;  Surgeon: Carole Civil, MD;  Location: [facility];  Service: Orthopedics;  Laterality: Left;  . Carpal tunnel release Right 03/27/2013    Procedure: RIGHT CARPAL TUNNEL RELEASE;  Surgeon: Carole Civil, MD;  Location: [facility];  Service: Orthopedics;  Laterality: Right;  . Cesarean section      x2  . Anterior cervical decomp/discectomy fusion N/A 08/06/2014    Procedure: ANTERIOR CERVICAL DECOMPRESSION/DISCECTOMY FUSION CERVICAL THREE-FOUR,CERVICAL SIX-SEVEN ,CERVICAL SEVEN-THORACIC ONE;  Surgeon: Floyce Stakes, MD;  Location: [place];  Service: Neurosurgery;  Laterality: N/A;    History reviewed. No pertinent family history.  Social History History  Substance Use Topics  . Smoking status: Never Smoker   . Smokeless tobacco: Not on file  . Alcohol Use: No    Allergies  Allergen Reactions  . Peanut-Containing Drug Products Anaphylaxis and Hives  . Shellfish Allergy Anaphylaxis and Hives  . Codeine Other (See Comments)    jitters    Current Outpatient Prescriptions  Medication Sig Dispense Refill  . acyclovir (ZOVIRAX) 400 MG tablet Take 400 mg by mouth 5 (five) times daily.    Marland Kitchen  amLODipine (NORVASC) 5 MG tablet Take 5 mg by mouth daily with breakfast.     . diazepam (VALIUM) 5 MG tablet Take 5 mg by mouth every 6 (six) hours as needed for anxiety.    . diclofenac (VOLTAREN) 50 MG EC tablet Take 1 tablet (50 mg total) by mouth 2 (two) times daily. 15 tablet 0  . estradiol (ESTRACE) 1 MG tablet Take 1 mg by mouth daily with breakfast.     . methocarbamol (ROBAXIN) 500 MG tablet Take 1 tablet (500 mg total) by mouth 2 (two) times daily. 20 tablet 0  . oxyCODONE-acetaminophen (PERCOCET/ROXICET) 5-325 MG per tablet Take 1 tablet by mouth every 4  (four) hours as needed. (Patient taking differently: Take 1 tablet by mouth every 8 (eight) hours as needed for moderate pain. ) 15 tablet 0   No current facility-administered medications for this visit.       Physical Exam Blood pressure 147/98, height 5\' 1"  (1.549 m), weight 147 lb (66.679 kg). Physical Exam The patient is well developed well nourished and well groomed. Orientation to person place and time is normal  Mood is pleasant. Ambulatory status is normal without abnormality today  Right shoulder first she has tenderness and a visible bruise over the right proximal humerus without scar. Active flexion 100 passive flexion 150, pain throughout the arc of motion 110-160 degrees stability tests normal. Strength test normal.  Left shoulder tenderness over the scar and anterior edge of the acromion and posterior edge of the acromion with painful passive and active range of motion but gray 5 muscle strength in the rotator cuff. Stability tests normal impingement sign positive. Scar healed without redness or swelling  Gross sensation is normal in both hands she is alert and oriented 3 she has no anxiety   Data Reviewed I reviewed x-rays of her left and right shoulder and both are independently interpreted as no acute injury fracture or dislocation  Assessment Encounter Diagnoses  Name Primary?  . Shoulder bursitis, left Yes  . Contusion of shoulder, right, initial encounter     Plan Recommend subacromial injection left shoulder for bursitis  Recommend heat and range of motion exercises for the right shoulder.    Operative report of rotator cuff repair OPERATIVE FINDINGS:  There was a nonretracted 1-1/2 cm tear of the  supraspinatus tendon.  There was mild bursitis.  There was a sublabral  foramen at approximately 10 o'clock position on the left shoulder which was  not a tear.  Subscapularis looked fine.  Glenohumeral joint looked normal.  Biceps anchor looked good.   Axillary pouch was normal.

## 2014-10-18 NOTE — Patient Instructions (Addendum)
Home exercises   Joint Injection Care After Refer to this sheet in the next few days. These instructions provide you with information on caring for yourself after you have had a joint injection. Your caregiver also may give you more specific instructions. Your treatment has been planned according to current medical practices, but problems sometimes occur. Call your caregiver if you have any problems or questions after your procedure. After any type of joint injection, it is not uncommon to experience:  Soreness, swelling, or bruising around the injection site.  Mild numbness, tingling, or weakness around the injection site caused by the numbing medicine used before or with the injection. It also is possible to experience the following effects associated with the specific agent after injection:  Iodine-based contrast agents:  Allergic reaction (itching, hives, widespread redness, and swelling beyond the injection site).  Corticosteroids (These effects are rare.):  Allergic reaction.  Increased blood sugar levels (If you have diabetes and you notice that your blood sugar levels have increased, notify your caregiver).  Increased blood pressure levels.  Mood swings.  Hyaluronic acid in the use of viscosupplementation.  Temporary heat or redness.  Temporary rash and itching.  Increased fluid accumulation in the injected joint. These effects all should resolve within a day after your procedure.  HOME CARE INSTRUCTIONS  Limit yourself to light activity the day of your procedure. Avoid lifting heavy objects, bending, stooping, or twisting.  Take prescription or over-the-counter pain medication as directed by your caregiver.  You may apply ice to your injection site to reduce pain and swelling the day of your procedure. Ice may be applied 03-04 times:  Put ice in a plastic bag.  Place a towel between your skin and the bag.  Leave the ice on for no longer than 15-20 minutes each  time. SEEK IMMEDIATE MEDICAL CARE IF:   Pain and swelling get worse rather than better or extend beyond the injection site.  Numbness does not go away.  Blood or fluid continues to leak from the injection site.  You have chest pain.  You have swelling of your face or tongue.  You have trouble breathing or you become dizzy.  You develop a fever, chills, or severe tenderness at the injection site that last longer than 1 day. MAKE SURE YOU:  Understand these instructions.  Watch your condition.  Get help right away if you are not doing well or if you get worse. Document Released: 03/29/2011 Document Revised: 10/08/2011 Document Reviewed: 03/29/2011 ExitCare Patient Information 2015 [employer]. This information is not intended to replace advice given to you by your health care provider. Make sure you discuss any questions you have with your health care provider.  

## 2014-11-10 ENCOUNTER — Encounter (HOSPITAL_COMMUNITY): Payer: Self-pay | Admitting: Physical Therapy

## 2014-11-10 ENCOUNTER — Telehealth: Payer: Self-pay | Admitting: Orthopedic Surgery

## 2014-11-10 ENCOUNTER — Ambulatory Visit (HOSPITAL_COMMUNITY): Payer: 59 | Attending: Neurosurgery | Admitting: Physical Therapy

## 2014-11-10 DIAGNOSIS — R29898 Other symptoms and signs involving the musculoskeletal system: Secondary | ICD-10-CM

## 2014-11-10 DIAGNOSIS — M5382 Other specified dorsopathies, cervical region: Secondary | ICD-10-CM | POA: Diagnosis not present

## 2014-11-10 DIAGNOSIS — M25611 Stiffness of right shoulder, not elsewhere classified: Secondary | ICD-10-CM | POA: Diagnosis not present

## 2014-11-10 DIAGNOSIS — M25612 Stiffness of left shoulder, not elsewhere classified: Secondary | ICD-10-CM | POA: Insufficient documentation

## 2014-11-10 DIAGNOSIS — M542 Cervicalgia: Secondary | ICD-10-CM | POA: Insufficient documentation

## 2014-11-10 NOTE — Telephone Encounter (Signed)
Patient states that since the injection in the left shoulder she states she is having throbbing pain and feels very weak she is scheduled to see Dr. Aline Brochure 11/30/14 but wanted to let Dr. Aline Brochure know and is there anything she can do until her office visit?

## 2014-11-10 NOTE — Therapy (Signed)
[place], [place], [address] Phone: [phone]   Fax:  [phone]  Physical Therapy Evaluation  Patient Details  Name: Kimberly [name] MRN: [id number] Date of Birth: 1960-10-14 Referring Provider:  Leeroy Cha, MD  Encounter Date: 11/10/2014      PT End of Session - 11/10/14 1153    Visit Number 1   Number of Visits 16   Date for PT Re-Evaluation 12/08/14   Authorization Type UHC    Authorization Time Period 11/10/14 to 01/10/15   Authorization - Visit Number 1   Authorization - Number of Visits 23   PT Start Time 1100   PT Stop Time 1150   PT Time Calculation (min) 50 min   Activity Tolerance Patient tolerated treatment well   Behavior During Therapy [facility] for tasks assessed/performed      Past Medical History  Diagnosis Date  . COPD (chronic obstructive pulmonary disease)   . Herpes   . Hypertension     pt. doesn't see cardiologist, followed for HTN by Dr. Gerarda Fraction  . Shortness of breath dyspnea   . Anxiety     takes alprazolam - rare use   . GERD (gastroesophageal reflux disease)     pt. reports that its better, no meds in use at this time- 2015  . Arthritis     cervical spondylosis     Past Surgical History  Procedure Laterality Date  . Abdominal hysterectomy    . Rotator cuff repair Left   . Foreign body removal Left     knee-as child  . Carpal tunnel release Left 02/16/2013    Procedure: CARPAL TUNNEL RELEASE;  Surgeon: Carole Civil, MD;  Location: [facility];  Service: Orthopedics;  Laterality: Left;  . Carpal tunnel release Right 03/27/2013    Procedure: RIGHT CARPAL TUNNEL RELEASE;  Surgeon: Carole Civil, MD;  Location: [facility];  Service: Orthopedics;  Laterality: Right;  . Cesarean section      x2  . Anterior cervical decomp/discectomy fusion N/A 08/06/2014    Procedure: ANTERIOR CERVICAL DECOMPRESSION/DISCECTOMY FUSION CERVICAL THREE-FOUR,CERVICAL SIX-SEVEN ,CERVICAL  SEVEN-THORACIC ONE;  Surgeon: Floyce Stakes, MD;  Location: [place];  Service: Neurosurgery;  Laterality: N/A;    There were no vitals filed for this visit.  Visit Diagnosis:  Cervical pain - Plan: PT plan of care cert/re-cert  Neck muscle weakness - Plan: PT plan of care cert/re-cert  Shoulder weakness - Plan: PT plan of care cert/re-cert  Decreased range of motion of neck - Plan: PT plan of care cert/re-cert  Decreased range of motion of shoulder, left - Plan: PT plan of care cert/re-cert  Decreased range of motion of shoulder, right - Plan: PT plan of care cert/re-cert      Subjective Assessment - 11/10/14 1105    Subjective Pain is worst laying down; neck is hurting the worst, and medicine for pain makes patient sleepy so she has to lay down. Average pain in back is 5-6/10, in neck 7-8/10.    Pertinent History Problems with neck and back started about a year and a half ago; was laid off January 2015. Before she was laid off, she was doing the work of 3 positions with a lot of head turning, stress on her neck. Went to multiple MDs about pain.    How long can you sit comfortably? 30 minutes at best   How long can you stand comfortably? More limited by breathing due to COPD  How long can you walk comfortably? Mostly limited by COPD; neck and back are not as much a problem   Patient Stated Goals get motion back in neck, especially rotation, without pain   Currently in Pain? Yes   Pain Score 7    Pain Location Neck   Pain Orientation Left   Pain Descriptors / Indicators Aching   Aggravating Factors  lying down   Pain Relieving Factors not doing anything that involves a lot of activity             [facility] PT Assessment - 11/10/14 0001    Assessment   Medical Diagnosis spondylosis without myelopathy or radiculopathy    Onset Date 08/11/13  approximate   Next MD Visit June 16th    Precautions   Precautions None   Restrictions   Weight Bearing Restrictions No   Balance  Screen   Has the patient fallen in the past 6 months Yes   How many times? 2  fell on R side getting into tub; also tripped on shoe string   Has the patient had a decrease in activity level because of a fear of falling?  Yes   Is the patient reluctant to leave their home because of a fear of falling?  No   Prior Function   Level of Independence Independent with basic ADLs;Independent with gait;Independent with transfers   Vocation On disability   Leisure reading   Posture/Postural Control   Posture Comments forward head, excess lordosis cervical and lumbar spine, flat thoracic spine, IR bilateral shoulders, muscle tension noted bilateral upper traps    AROM   Right Shoulder Flexion 145 Degrees   Right Shoulder ABduction 155 Degrees   Right Shoulder Internal Rotation 90 Degrees  approximate   Right Shoulder External Rotation 67 Degrees   Left Shoulder Flexion 87 Degrees   Left Shoulder ABduction 90 Degrees   Left Shoulder Internal Rotation 90 Degrees  approximate   Left Shoulder External Rotation 40 Degrees  limited due to old shoulder injury    Cervical Flexion 62   Cervical Extension 22   Cervical - Right Side Bend 36   Cervical - Left Side Bend 32   Cervical - Right Rotation 36   Cervical - Left Rotation 55   Strength   Right Shoulder Flexion 4-/5   Right Shoulder ABduction 4-/5   Right Shoulder Internal Rotation 4-/5   Right Shoulder External Rotation 4-/5   Left Shoulder Flexion 3-/5  pain limited due to old shoulder injury    Left Shoulder ABduction 3-/5  pain limited due to old shoulder injury    Left Shoulder Internal Rotation 3+/5  limited due to pain from old shoulder injury    Left Shoulder External Rotation 3/5  limited due to pain from old shoulder injury    Cervical Flexion 3+/5   Cervical Extension 3+/5   Cervical - Right Side Bend 3/5   Cervical - Left Side Bend 3/5   Cervical - Right Rotation 3-/5   Cervical - Left Rotation 3+/5                            PT Education - 11/10/14 1151    Education provided Yes   Education Details Prognosis; overall PT plan of care moving forward; HEP; education that patient needs to see MD as soon as possible regarding feelings of edema in throat/changes in appetite/weight loss over past few months  Person(s) Educated Patient   Methods Explanation;Demonstration;Handout   Comprehension Verbalized understanding;Need further instruction          PT Short Term Goals - 11/10/14 1159    PT SHORT TERM GOAL #1   Title Patient will improve her cervical range of motion by at least 10 degrees on all planes to assist her in better performing functional tasks    Time 4   Period Weeks   Status New   PT SHORT TERM GOAL #2   Title Patient will complain of no more than 4/10 pain in her neck during all functional tasks and activities involved in caring for her mother    Time 4   Period Weeks   Status New   PT SHORT TERM GOAL #3   Title Patient will demonstrate improved bilateral shoulder  and shoulder girdle strength to at least 4/5 in order to improve regional stability and reduce pain    Time 4   Period Weeks   Status New   PT SHORT TERM GOAL #4   Title Patient will be able to verbally state the importance of maintaining good posture in reducing her overall pain and will also be able to maintain good posture and cervical mechanics 90% of the time    Time 4   Period Weeks   Status New           PT Long Term Goals - 11/10/14 1202    PT LONG TERM GOAL #1   Title Patient will be independent in correctly and consistently performing appropriate HEP in order to assist in the management of her condition   Time 8   Period Weeks   Status New   PT LONG TERM GOAL #2   Title Patient will demonstrate at least 4/5 cervical strength on all planes of motion and will demonstrate at least 4+/5 in bilateral shoulder/shoulder girdle musculature    Time 8   Period Weeks    Status New   PT LONG TERM GOAL #3   Title Patient will demonstrate the ability to easily perform cervical rotation either direction at least 70 degrees with pain no more than 1/10 in order to allow her to safely drive and perform functional tasks    Time 8   Period Weeks   Status New   PT LONG TERM GOAL #4   Title Patient will be able to lie on her back with cervical pain at 0/10 in order to allow her to get a good night's sleep and reduce environmental stress    Time 8   Period Weeks   Status New               Plan - 11/10/14 1154    Clinical Impression Statement Patient presents with cervical and thoracic spine pain, reduced strength and range of motion in cervical spine and bilateral shoulders, reduced ability to perform functional activities such as head turning during driving, and reduced overall ability to perform functional ADL tasks. Patient is also limited in her L shoulder by an old shoulder injury and is seeing her MD regarding a plan moving forward, as she recently had a cortisone shot that did not reduce her shoulder pain. Patient's father is in a nursing home and she is currently the primary caretaker for her elderly mother, who has dementia and is becoming more of a challenge for the patient to manage especially with her current cervical and shoulder pain. The patient will benefit from  skilled PT services in  order to address these deficits and to assist her in reaching an optimal level of function.    Pt will benefit from skilled therapeutic intervention in order to improve on the following deficits Decreased coordination;Decreased range of motion;Impaired flexibility;Improper body mechanics;Postural dysfunction;Decreased activity tolerance;Decreased balance;Pain;Impaired UE functional use;Increased muscle spasms;Decreased mobility;Decreased strength;Impaired perceived functional ability   Rehab Potential Good   PT Frequency 2x / week   PT Duration 8 weeks   PT  Treatment/Interventions ADLs/Self Care Home Management;Neuromuscular re-education;Biofeedback;Functional mobility training;Patient/family education;Cryotherapy;Therapeutic activities;Therapeutic exercise;Manual techniques;Energy conservation;Moist Heat;Balance training;Traction   PT Next Visit Plan Functional stretching and strengthening, postural training, review HEP and goals    PT Home Exercise Plan cervical rotation/flexion/extension/lateral flexion/retraction/protraction, thoracic spine excursions    Consulted and Agree with Plan of Care Patient         Problem List Patient Active Problem List   Diagnosis Date Noted  . Cervical stenosis of spinal canal 08/06/2014  . Muscle weakness (generalized) 11/12/2013  . Tight fascia 11/12/2013  . Decreased range of motion of shoulder 11/12/2013  . Cervical spondylosis without myelopathy 10/29/2013  . S/P carpal tunnel release 02/19/2013  . CTS (carpal tunnel syndrome) 02/19/2013  . SHOULDER PAIN 10/06/2007  . IMPINGEMENT SYNDROME 10/06/2007  . RUPTURE ROTATOR CUFF 10/06/2007  . HIGH BLOOD PRESSURE 10/03/2007    Deniece Ree PT, DPT [facility] [address] [place], [place], [address] Phone: [phone]   Fax:  [phone]

## 2014-11-10 NOTE — Patient Instructions (Signed)
AROM: Neck Flexion/Extension   Bend head forward to look towards toes, then bend head backward to look up at ceiling. Only go to the point of pain and then switch directions. Repeat __10__ times per set. Do _1___ sets per session. Do _2___ sessions per day.  http://orth.exer.us/299   Copyright  VHI. All rights reserved.   AROM: Lateral Neck Flexion   Slowly tilt head toward one shoulder, then the other. Only go to the point of pain or discomfort, then change directions.  Repeat __10__ times per set. Do __1__ sets per session. Do __2__ sessions per day.  http://orth.exer.us/297   Copyright  VHI. All rights reserved.   AROM: Neck Rotation   Turn head slowly to look over one shoulder, then the other. Only go to the point of pain or discomfort, then switch directions.  Repeat _10___ times per set. Do __1__ sets per session. Do __2__ sessions per day.  http://orth.exer.us/295   Copyright  VHI. All rights reserved.    Flexibility: Neck Retraction   Pull head straight back, keeping eyes and jaw level. Repeat __10__ times per set. Do __1__ sets per session. Do __10__ sessions per day.  http://orth.exer.us/345   Copyright  VHI. All rights reserved.    THORACIC SPINE EXCURSIONS (1x10 each); keep neck still through all exercises   1) with arms crossed across chest, hunch forward, then extend back up  2) with arms crossed across chest, bend side to side, trying to touch your elbow down to your hip 3) with arms crossed across chest, rotate your body side to side

## 2014-11-11 NOTE — Telephone Encounter (Signed)
Place arm in over the counter sling   Apply max freeze 3 x a day or blue emu cream

## 2014-11-11 NOTE — Telephone Encounter (Signed)
Routing to Dr Harrison 

## 2014-11-15 NOTE — Telephone Encounter (Signed)
Patient aware.

## 2014-11-17 ENCOUNTER — Telehealth (HOSPITAL_COMMUNITY): Payer: Self-pay | Admitting: Physical Therapy

## 2014-11-17 ENCOUNTER — Ambulatory Visit (HOSPITAL_COMMUNITY): Payer: 59

## 2014-11-17 DIAGNOSIS — M5382 Other specified dorsopathies, cervical region: Secondary | ICD-10-CM

## 2014-11-17 DIAGNOSIS — M629 Disorder of muscle, unspecified: Secondary | ICD-10-CM

## 2014-11-17 DIAGNOSIS — M542 Cervicalgia: Secondary | ICD-10-CM | POA: Diagnosis not present

## 2014-11-17 DIAGNOSIS — M6289 Other specified disorders of muscle: Secondary | ICD-10-CM

## 2014-11-17 DIAGNOSIS — M6281 Muscle weakness (generalized): Secondary | ICD-10-CM

## 2014-11-17 DIAGNOSIS — M25612 Stiffness of left shoulder, not elsewhere classified: Secondary | ICD-10-CM

## 2014-11-17 DIAGNOSIS — R29898 Other symptoms and signs involving the musculoskeletal system: Secondary | ICD-10-CM

## 2014-11-17 DIAGNOSIS — M25611 Stiffness of right shoulder, not elsewhere classified: Secondary | ICD-10-CM

## 2014-11-17 NOTE — Therapy (Signed)
[place], [place], [address] Phone: [phone]   Fax:  [phone]  Physical Therapy Treatment  Patient Details  Name: Kimberly [name] MRN: [id number] Date of Birth: 07/09/1961 Referring Provider:  Leeroy Cha, MD  Encounter Date: 11/17/2014      PT End of Session - 11/17/14 1609    Visit Number 2   Number of Visits 16   Date for PT Re-Evaluation 12/08/14   Authorization Type UHC    Authorization Time Period 11/10/14 to 01/10/15   Authorization - Visit Number 2   Authorization - Number of Visits 23   PT Start Time 1540   PT Stop Time 1633   PT Time Calculation (min) 53 min   Activity Tolerance Patient tolerated treatment well   Behavior During Therapy [facility] for tasks assessed/performed      Past Medical History  Diagnosis Date  . COPD (chronic obstructive pulmonary disease)   . Herpes   . Hypertension     pt. doesn't see cardiologist, followed for HTN by Dr. Gerarda Fraction  . Shortness of breath dyspnea   . Anxiety     takes alprazolam - rare use   . GERD (gastroesophageal reflux disease)     pt. reports that its better, no meds in use at this time- 2015  . Arthritis     cervical spondylosis     Past Surgical History  Procedure Laterality Date  . Abdominal hysterectomy    . Rotator cuff repair Left   . Foreign body removal Left     knee-as child  . Carpal tunnel release Left 02/16/2013    Procedure: CARPAL TUNNEL RELEASE;  Surgeon: Carole Civil, MD;  Location: [facility];  Service: Orthopedics;  Laterality: Left;  . Carpal tunnel release Right 03/27/2013    Procedure: RIGHT CARPAL TUNNEL RELEASE;  Surgeon: Carole Civil, MD;  Location: [facility];  Service: Orthopedics;  Laterality: Right;  . Cesarean section      x2  . Anterior cervical decomp/discectomy fusion N/A 08/06/2014    Procedure: ANTERIOR CERVICAL DECOMPRESSION/DISCECTOMY FUSION CERVICAL THREE-FOUR,CERVICAL SIX-SEVEN ,CERVICAL  SEVEN-THORACIC ONE;  Surgeon: Floyce Stakes, MD;  Location: [facility];  Service: Neurosurgery;  Laterality: N/A;    There were no vitals filed for this visit.  Visit Diagnosis:  Cervical pain  Neck muscle weakness  Shoulder weakness  Decreased range of motion of neck  Decreased range of motion of shoulder, left  Muscle weakness (generalized)  Tight fascia  Decreased range of motion of shoulder, right      Subjective Assessment - 11/17/14 1542    Subjective Pt reports pain scale 5/10 Rt and  Lt sides of neck, reports the brace on Lt arm increased neck.  Pt stated complaince with HEP but has diffiiculty with retraction exercises.  Stated the corisone shot relief only lasted for 1 day   Currently in Pain? Yes   Pain Score 5    Pain Location Neck   Pain Orientation Left;Right;Posterior   Pain Descriptors / Indicators Aching            [facility] PT Assessment - 11/17/14 0001    Assessment   Medical Diagnosis spondylosis without myelopathy or radiculopathy    Onset Date 08/11/13   Next MD Visit [place] 11/18/2014 and 06/16/2016Aline Brochure 11/30/2014               [place] [facility] PT Treatment/Exercise - 11/17/14 0001    Posture/Postural Control   Posture  Comments forward head, excess lordosis cervical and lumbar spine, flat thoracic spine, IR bilateral shoulders, muscle tension noted bilateral upper traps    Exercises   Exercises Neck   Neck Exercises: Seated   Neck Retraction 10 reps;3 secs   Neck Retraction Limitations therapist facilitation   Cervical Rotation Both;10 reps   Cervical Rotation Limitations 3D cervical excursion   Lateral Flexion Both;10 reps   Lateral Flexion Limitations 3D cervical excursion   W Back 10 reps   Other Seated Exercise 3D thoracic excursion 10   Other Seated Exercise rows 10   Neck Exercises: Stretches   Upper Trapezius Stretch 3 reps;30 seconds   Upper Trapezius Stretch Limitations hand on side of chair and sidebend as tolerated    Levator Stretch 3 reps;30 seconds   Other Neck Stretches Uni pec stretch Bil 10 x3"             PT Education - 11/17/14 1556    Education provided Yes   Education Details Reviewed HEP for proper technique, educated importance of posture and good bra fitting for proper support to assist with posture and pain relief   Person(s) Educated Patient   Methods Explanation;Demonstration   Comprehension Verbalized understanding;Returned demonstration          PT Short Term Goals - 11/17/14 1721    PT SHORT TERM GOAL #1   Title Patient will improve her cervical range of motion by at least 10 degrees on all planes to assist her in better performing functional tasks    Status On-going   PT SHORT TERM GOAL #2   Title Patient will complain of no more than 4/10 pain in her neck during all functional tasks and activities involved in caring for her mother    Status On-going   PT SHORT TERM GOAL #3   Title Patient will demonstrate improved bilateral shoulder  and shoulder girdle strength to at least 4/5 in order to improve regional stability and reduce pain    Status On-going   PT SHORT TERM GOAL #4   Title Patient will be able to verbally state the importance of maintaining good posture in reducing her overall pain and will also be able to maintain good posture and cervical mechanics 90% of the time    Status On-going           PT Long Term Goals - 11/17/14 1722    PT LONG TERM GOAL #1   Title Patient will be independent in correctly and consistently performing appropriate HEP in order to assist in the management of her condition   PT LONG TERM GOAL #2   Title Patient will demonstrate at least 4/5 cervical strength on all planes of motion and will demonstrate at least 4+/5 in bilateral shoulder/shoulder girdle musculature    PT LONG TERM GOAL #3   Title Patient will demonstrate the ability to easily perform cervical rotation either direction at least 70 degrees with pain no more than 1/10  in order to allow her to safely drive and perform functional tasks    PT LONG TERM GOAL #4   Title Patient will be able to lie on her back with cervical pain at 0/10 in order to allow her to get a good night's sleep and reduce environmental stress                Plan - 11/17/14 1610    Clinical Impression Statement Reviewed HEP for proper technique with multimodal cueing to improve technique with retraction.  Reviewed goals and copy of evaluation given to pt.  Pt reported she has apt with refering MD regarding the edema in her throat.  Pt encouraged to call MD Aline Brochure about pain in Lt arm brace. Session focus on improving cervical ROM with stretches and exercies.  Pt educated on importance of posture.  Pt stated pain reduced to 2-3/10 at end of session.     PT Next Visit Plan Functional stretching and strengthening, postural training, review technique with cervical retraction for correct form.          Problem List Patient Active Problem List   Diagnosis Date Noted  . Cervical stenosis of spinal canal 08/06/2014  . Muscle weakness (generalized) 11/12/2013  . Tight fascia 11/12/2013  . Decreased range of motion of shoulder 11/12/2013  . Cervical spondylosis without myelopathy 10/29/2013  . S/P carpal tunnel release 02/19/2013  . CTS (carpal tunnel syndrome) 02/19/2013  . SHOULDER PAIN 10/06/2007  . IMPINGEMENT SYNDROME 10/06/2007  . RUPTURE ROTATOR CUFF 10/06/2007  . HIGH BLOOD PRESSURE 10/03/2007   Ihor Austin, [place]; [place] #[id number] [phone]  Aldona Lento 11/17/2014, 5:26 PM  [facility] [address] [place], [place], [address] Phone: [phone]   Fax:  [phone]

## 2014-11-17 NOTE — Telephone Encounter (Signed)
Patient did not show up for 1:00PM appointment. Called patient to check in; she stated that she was mistaken and thought that her appointment was actually at 4:20 this afternoon, very apologetic. Patient willing to reschedule her appointment to a 4:00PM slot with PTA this afternoon (11/17/14).   Deniece Ree PT, DPT [phone]

## 2014-11-18 ENCOUNTER — Telehealth: Payer: Self-pay | Admitting: Orthopedic Surgery

## 2014-11-18 NOTE — Telephone Encounter (Signed)
FYI

## 2014-11-18 NOTE — Telephone Encounter (Signed)
Patient called to make Dr. Aline Brochure aware that she can no longer wear the sling because it is pulling on her neck which she is in therapy for neck pain.

## 2014-11-24 ENCOUNTER — Ambulatory Visit (HOSPITAL_COMMUNITY): Payer: 59 | Admitting: Physical Therapy

## 2014-11-24 DIAGNOSIS — M629 Disorder of muscle, unspecified: Secondary | ICD-10-CM

## 2014-11-24 DIAGNOSIS — M6281 Muscle weakness (generalized): Secondary | ICD-10-CM

## 2014-11-24 DIAGNOSIS — M25611 Stiffness of right shoulder, not elsewhere classified: Secondary | ICD-10-CM

## 2014-11-24 DIAGNOSIS — M542 Cervicalgia: Secondary | ICD-10-CM | POA: Diagnosis not present

## 2014-11-24 DIAGNOSIS — R29898 Other symptoms and signs involving the musculoskeletal system: Secondary | ICD-10-CM

## 2014-11-24 DIAGNOSIS — M25612 Stiffness of left shoulder, not elsewhere classified: Secondary | ICD-10-CM

## 2014-11-24 DIAGNOSIS — M6289 Other specified disorders of muscle: Secondary | ICD-10-CM

## 2014-11-24 DIAGNOSIS — M5382 Other specified dorsopathies, cervical region: Secondary | ICD-10-CM

## 2014-11-24 NOTE — Therapy (Signed)
[place], [place], [address] Phone: [phone]   Fax:  [phone]  Physical Therapy Treatment  Patient Details  Name: Kimberly [name] MRN: [id number] Date of Birth: 1961/03/08 Referring Provider:  Redmond School, MD  Encounter Date: 11/24/2014      PT End of Session - 11/24/14 1541    Visit Number 3   Number of Visits 16   Date for PT Re-Evaluation 12/08/14   Authorization Type UHC    Authorization Time Period 11/10/14 to 01/10/15   Authorization - Visit Number 3   Authorization - Number of Visits 23   PT Start Time 4580   PT Stop Time 1420   PT Time Calculation (min) 25 min   Activity Tolerance Patient tolerated treatment well   Behavior During Therapy [facility] for tasks assessed/performed      Past Medical History  Diagnosis Date  . COPD (chronic obstructive pulmonary disease)   . Herpes   . Hypertension     pt. doesn't see cardiologist, followed for HTN by Dr. Gerarda Fraction  . Shortness of breath dyspnea   . Anxiety     takes alprazolam - rare use   . GERD (gastroesophageal reflux disease)     pt. reports that its better, no meds in use at this time- 2015  . Arthritis     cervical spondylosis     Past Surgical History  Procedure Laterality Date  . Abdominal hysterectomy    . Rotator cuff repair Left   . Foreign body removal Left     knee-as child  . Carpal tunnel release Left 02/16/2013    Procedure: CARPAL TUNNEL RELEASE;  Surgeon: Carole Civil, MD;  [facility]: [facility];  Service: Orthopedics;  Laterality: Left;  . Carpal tunnel release Right 03/27/2013    Procedure: RIGHT CARPAL TUNNEL RELEASE;  Surgeon: Carole Civil, MD;  Location: [facility];  Service: Orthopedics;  Laterality: Right;  . Cesarean section      x2  . Anterior cervical decomp/discectomy fusion N/A 08/06/2014    Procedure: ANTERIOR CERVICAL DECOMPRESSION/DISCECTOMY FUSION CERVICAL THREE-FOUR,CERVICAL SIX-SEVEN ,CERVICAL  SEVEN-THORACIC ONE;  Surgeon: Floyce Stakes, MD;  Location: [place];  Service: Neurosurgery;  Laterality: N/A;    There were no vitals filed for this visit.  Visit Diagnosis:  Cervical pain  Neck muscle weakness  Decreased range of motion of neck  Shoulder weakness  Decreased range of motion of shoulder, left  Muscle weakness (generalized)  Tight fascia  Decreased range of motion of shoulder, right      Subjective Assessment - 11/24/14 1537    Subjective Pt comes today complaining of increased pain, especially on Lt side of cervical musculature.  Currently with 8/10 pain.  STates she believes it is mostly due to stress.   Currently in Pain? Yes   Pain Score 8    Pain Location Neck   Pain Orientation Right                         [facility] Adult PT Treatment/Exercise - 11/24/14 1539    Neck Exercises: Seated   Cervical Rotation Both;10 reps   Cervical Rotation Limitations 3D cervical excursion   Lateral Flexion Both;10 reps   Lateral Flexion Limitations 3D cervical excursion   Manual Therapy   Manual Therapy Massage   Massage seated to cervical musculature, Lt>RT   Neck Exercises: Stretches   Upper Trapezius Stretch 3 reps;30 seconds  Upper Trapezius Stretch Limitations hand on side of chair and sidebend as tolerated   Levator Stretch 3 reps;30 seconds                  PT Short Term Goals - 11/17/14 1721    PT SHORT TERM GOAL #1   Title Patient will improve her cervical range of motion by at least 10 degrees on all planes to assist her in better performing functional tasks    Status On-going   PT [place] #2   Title Patient will complain of no more than 4/10 pain in her neck during all functional tasks and activities involved in caring for her mother    Status On-going   PT SHORT TERM GOAL #3   Title Patient will demonstrate improved bilateral shoulder  and shoulder girdle strength to at least 4/5 in order to improve regional  stability and reduce pain    Status On-going   PT SHORT TERM GOAL #4   Title Patient will be able to verbally state the importance of maintaining good posture in reducing her overall pain and will also be able to maintain good posture and cervical mechanics 90% of the time    Status On-going           PT Long Term Goals - 11/17/14 1722    PT LONG TERM GOAL #1   Title Patient will be independent in correctly and consistently performing appropriate HEP in order to assist in the management of her condition   PT LONG TERM GOAL #2   Title Patient will demonstrate at least 4/5 cervical strength on all planes of motion and will demonstrate at least 4+/5 in bilateral shoulder/shoulder girdle musculature    PT LONG TERM GOAL #3   Title Patient will demonstrate the ability to easily perform cervical rotation either direction at least 70 degrees with pain no more than 1/10 in order to allow her to safely drive and perform functional tasks    PT LONG TERM GOAL #4   Title Patient will be able to lie on her back with cervical pain at 0/10 in order to allow her to get a good night's sleep and reduce environmental stress                Plan - 11/24/14 1542    Clinical Impression Statement Completed manual today to cervical musculature in seated postition.  Therex/stretches completed with manual activity.  Notable large spasms in LT upper trap and levator musculature.  Pt very stressed due to current relationship with spouse and very unhappy.  Suggested patient see a couselor and states she is seeing a psychologist regarding this.  Able to reduce pain to 3/10 at end of session.  No further exercises completed today due to high stress and pain level.    PT Next Visit Plan Functional stretching and strengthening, postural training, review technique with cervical retraction for correct form.    Resume remaining therex and continue manual as needed.          Problem List Patient Active Problem List    Diagnosis Date Noted  . Cervical stenosis of spinal canal 08/06/2014  . Muscle weakness (generalized) 11/12/2013  . Tight fascia 11/12/2013  . Decreased range of motion of shoulder 11/12/2013  . Cervical spondylosis without myelopathy 10/29/2013  . S/P carpal tunnel release 02/19/2013  . CTS (carpal tunnel syndrome) 02/19/2013  . SHOULDER PAIN 10/06/2007  . IMPINGEMENT SYNDROME 10/06/2007  . RUPTURE ROTATOR CUFF  10/06/2007  . HIGH BLOOD PRESSURE 10/03/2007    Teena Irani, PTA/CLT [phone] 11/24/2014, 3:45 PM  [facility] [address] [place], [place], [address] Phone: [phone]   Fax:  [phone]

## 2014-11-25 NOTE — Addendum Note (Signed)
Addended by: Hunt Oris on: 11/25/2014 08:43 AM   Modules accepted: Orders

## 2014-11-26 ENCOUNTER — Ambulatory Visit (HOSPITAL_COMMUNITY): Payer: 59

## 2014-11-26 DIAGNOSIS — M542 Cervicalgia: Secondary | ICD-10-CM | POA: Diagnosis not present

## 2014-11-26 DIAGNOSIS — M25611 Stiffness of right shoulder, not elsewhere classified: Secondary | ICD-10-CM

## 2014-11-26 DIAGNOSIS — M25612 Stiffness of left shoulder, not elsewhere classified: Secondary | ICD-10-CM

## 2014-11-26 DIAGNOSIS — M5382 Other specified dorsopathies, cervical region: Secondary | ICD-10-CM

## 2014-11-26 DIAGNOSIS — M6281 Muscle weakness (generalized): Secondary | ICD-10-CM

## 2014-11-26 DIAGNOSIS — M6289 Other specified disorders of muscle: Secondary | ICD-10-CM

## 2014-11-26 DIAGNOSIS — M629 Disorder of muscle, unspecified: Secondary | ICD-10-CM

## 2014-11-26 DIAGNOSIS — R29898 Other symptoms and signs involving the musculoskeletal system: Secondary | ICD-10-CM

## 2014-11-26 NOTE — Therapy (Signed)
[place], [place], [address] Phone: [phone]   Fax:  [phone]  Physical Therapy Treatment  Patient Details  Name: Kimberly [name] MRN: [id number] Date of Birth: 12-27-1960 Referring Provider:  Redmond School, MD  Encounter Date: 11/26/2014      PT End of Session - 11/26/14 1618    Visit Number 4   Number of Visits 16   Date for PT Re-Evaluation 12/08/14   Authorization Type UHC    Authorization Time Period 11/10/14 to 01/10/15   Authorization - Visit Number 4   Authorization - Number of Visits 23   PT Start Time 5427   PT Stop Time 1645   PT Time Calculation (min) 42 min   Activity Tolerance Patient tolerated treatment well   Behavior During Therapy [facility] for tasks assessed/performed      Past Medical History  Diagnosis Date  . COPD (chronic obstructive pulmonary disease)   . Herpes   . Hypertension     pt. doesn't see cardiologist, followed for HTN by Dr. Gerarda Fraction  . Shortness of breath dyspnea   . Anxiety     takes alprazolam - rare use   . GERD (gastroesophageal reflux disease)     pt. reports that its better, no meds in use at this time- 2015  . Arthritis     cervical spondylosis     Past Surgical History  Procedure Laterality Date  . Abdominal hysterectomy    . Rotator cuff repair Left   . Foreign body removal Left     knee-as child  . Carpal tunnel release Left 02/16/2013    Procedure: CARPAL TUNNEL RELEASE;  Surgeon: Carole Civil, MD;  [facility];  Service: Orthopedics;  Laterality: Left;  . Carpal tunnel release Right 03/27/2013    Procedure: RIGHT CARPAL TUNNEL RELEASE;  Surgeon: Carole Civil, MD;  Location[facility];  Service: Orthopedics;  Laterality: Right;  . Cesarean section      x2  . Anterior cervical decomp/discectomy fusion N/A 08/06/2014    Procedure: ANTERIOR CERVICAL DECOMPRESSION/DISCECTOMY FUSION CERVICAL THREE-FOUR,CERVICAL SIX-SEVEN ,CERVICAL  SEVEN-THORACIC ONE;  Surgeon: Floyce Stakes, MD;  Location: [place];  Service: Neurosurgery;  Laterality: N/A;    There were no vitals filed for this visit.  Visit Diagnosis:  Cervical pain  Neck muscle weakness  Decreased range of motion of neck  Shoulder weakness  Decreased range of motion of shoulder, left  Muscle weakness (generalized)  Tight fascia  Decreased range of motion of shoulder, right      Subjective Assessment - 11/26/14 1604    Subjective Pt stated she was sore following last session but feels a lot better today, pain scale 3/10 Lt side of neck   Currently in Pain? Yes   Pain Score 3    Pain Location Neck   Pain Orientation Left   Pain Descriptors / Indicators Aching            [facility] PT Assessment - 11/26/14 0001    Assessment   Medical Diagnosis spondylosis without myelopathy or radiculopathy    Onset Date 08/11/13   Next MD Visit [facility] 11/18/2014 and 06/16/2016Aline Brochure 11/30/2014               [facility] Adult PT Treatment/Exercise - 11/26/14 0001    Exercises   Exercises Neck   Neck Exercises: Seated   Neck Retraction 10 reps;3 secs   Cervical Rotation Both;10 reps   Cervical Rotation  Limitations 3D cervical excursion   Lateral Flexion Both;10 reps   Lateral Flexion Limitations 3D cervical excursion   W Back 10 reps   Other Seated Exercise 3D thoracic excursion 10   Manual Therapy   Manual Therapy Massage   Massage prone upper and mid traps, rhomboids, levator scapula, scalenes Lt >RT   Neck Exercises: Stretches   Upper Trapezius Stretch 3 reps;30 seconds   Corner Stretch 3 reps;30 seconds            PT Short Term Goals - 11/26/14 1708    PT SHORT TERM GOAL #1   Title Patient will improve her cervical range of motion by at least 10 degrees on all planes to assist her in better performing functional tasks    Status On-going   PT SHORT TERM GOAL #2   Title Patient will complain of no more than 4/10 pain in her neck during  all functional tasks and activities involved in caring for her mother    Status On-going   PT SHORT TERM GOAL #3   Title Patient will demonstrate improved bilateral shoulder  and shoulder girdle strength to at least 4/5 in order to improve regional stability and reduce pain    PT SHORT TERM GOAL #4   Title Patient will be able to verbally state the importance of maintaining good posture in reducing her overall pain and will also be able to maintain good posture and cervical mechanics 90% of the time    Status On-going           PT Long Term Goals - 11/26/14 1709    PT LONG TERM GOAL #1   Title Patient will be independent in correctly and consistently performing appropriate HEP in order to assist in the management of her condition   PT LONG TERM GOAL #2   Title Patient will demonstrate at least 4/5 cervical strength on all planes of motion and will demonstrate at least 4+/5 in bilateral shoulder/shoulder girdle musculature    PT LONG TERM GOAL #3   Title Patient will demonstrate the ability to easily perform cervical rotation either direction at least 70 degrees with pain no more than 1/10 in order to allow her to safely drive and perform functional tasks    PT LONG TERM GOAL #4   Title Patient will be able to lie on her back with cervical pain at 0/10 in order to allow her to get a good night's sleep and reduce environmental stress                Plan - 11/26/14 1642    Clinical Impression Statement Resumed cervical and thoracic mobilty and postural strengthening exercises for strengthening.  Pt with improved mechanics with cervical retraction with less verbal and tactile cueing required.  Added pec stretches to help reduce forward rounded shoulders and improve overall thoracic mobilty.  Manual therapy focuson scapular, upper and mid trap and Levator scapula to reduce spasms.  Pt reported pain resolved at end of session.  Encouraged pt to drink water to reduce risk of headache  following manual.     PT Next Visit Plan Functional stretching and strengthening, postural training, review technique with cervical retraction for correct form.    Resume remaining therex and continue manual as needed.  Progress to redband posture strengthening next session.        Problem List Patient Active Problem List   Diagnosis Date Noted  . Cervical stenosis of spinal canal 08/06/2014  . Muscle  weakness (generalized) 11/12/2013  . Tight fascia 11/12/2013  . Decreased range of motion of shoulder 11/12/2013  . Cervical spondylosis without myelopathy 10/29/2013  . S/P carpal tunnel release 02/19/2013  . CTS (carpal tunnel syndrome) 02/19/2013  . SHOULDER PAIN 10/06/2007  . IMPINGEMENT SYNDROME 10/06/2007  . RUPTURE ROTATOR CUFF 10/06/2007  . HIGH BLOOD PRESSURE 10/03/2007   Ihor Austin, [place]; [place] #[id number] [phone]  Aldona Lento 11/26/2014, 5:19 PM  [facility] [address] [place], [place], [address] Phone: [phone]   Fax:  [phone]

## 2014-11-30 ENCOUNTER — Ambulatory Visit (INDEPENDENT_AMBULATORY_CARE_PROVIDER_SITE_OTHER): Payer: 59 | Admitting: Orthopedic Surgery

## 2014-11-30 ENCOUNTER — Encounter: Payer: Self-pay | Admitting: Orthopedic Surgery

## 2014-11-30 VITALS — BP 120/87 | Ht 61.0 in | Wt 147.0 lb

## 2014-11-30 DIAGNOSIS — Z9889 Other specified postprocedural states: Secondary | ICD-10-CM | POA: Diagnosis not present

## 2014-11-30 DIAGNOSIS — S40011D Contusion of right shoulder, subsequent encounter: Secondary | ICD-10-CM

## 2014-11-30 DIAGNOSIS — M75122 Complete rotator cuff tear or rupture of left shoulder, not specified as traumatic: Secondary | ICD-10-CM

## 2014-11-30 NOTE — Patient Instructions (Signed)
We will schedule MRI + arthrogram left shoulder and call you with results

## 2014-11-30 NOTE — Progress Notes (Signed)
Patient ID: MYLI PAE, female   DOB: 1960/08/02, 54 y.o.   MRN: [id number] Progress note follow-up established problem worse  Chief Complaint  Patient presents with  . Follow-up    6 week follow up left shoulder s/p injection + home exercise   Kimberly [name] has a couple of problems she is 54 years old she had cervical spine surgery in January 2016 she still in therapy for that. She complains of some mild numbness over the right thenar eminence which is recently come on. She had a carpal tunnel release did well. Her fingers don't seem to be involved.  Review of systems the pain in the hand as stated. Left arm has no neurologic symptoms. She has no other muscle pain or weakness. Denies any evidence of fevers chills or night sweats.  BP 120/87 mmHg  Ht 5\' 1"  (1.549 m)  Wt 147 lb (66.679 kg)  BMI 27.79 kg/m2 Appearance normal grooming hygiene normal oriented 3 mood affect normal  She fell at a contusion to the right shoulder that still bothering her structurally x-rays were negative and her exam which I repeated today shows no structural damage in the rotator cuff she does have some tenderness around the deltoid area but she has full forward elevation her shoulder is stable her strength is normal  Left shoulder the patient had an open rotator cuff repair with a mini open technique she did well. She complains of severe shoulder pain inability to sleep on her left side painful [place] elevation and weakness when reaching away from her body  Repeat examination shows weakness in the supraspinatus tendon slight decreased range of motion in the [place] elevation plain. Some mild loss of external rotation. Painful internal rotation. Stability tests were normal.  Neurovascular exam of the left upper extremity was normal and there were no lymph nodes in the axilla or cervical region that were enlarged  Her x-ray of the left shoulder on the last visit was also normal  At this point she is had physical therapy  for the shoulder she had a cortisone injection she's taken pain medication. She did not improve. I would like to get an arthrogram MRI to assess her for repeat rotator cuff tear

## 2014-12-01 ENCOUNTER — Encounter (HOSPITAL_COMMUNITY): Payer: 59 | Admitting: Physical Therapy

## 2014-12-03 ENCOUNTER — Ambulatory Visit (HOSPITAL_COMMUNITY): Payer: 59 | Attending: Neurosurgery

## 2014-12-03 VITALS — BP 127/90

## 2014-12-03 DIAGNOSIS — M25611 Stiffness of right shoulder, not elsewhere classified: Secondary | ICD-10-CM | POA: Diagnosis not present

## 2014-12-03 DIAGNOSIS — M629 Disorder of muscle, unspecified: Secondary | ICD-10-CM

## 2014-12-03 DIAGNOSIS — M542 Cervicalgia: Secondary | ICD-10-CM | POA: Diagnosis not present

## 2014-12-03 DIAGNOSIS — R29898 Other symptoms and signs involving the musculoskeletal system: Secondary | ICD-10-CM | POA: Insufficient documentation

## 2014-12-03 DIAGNOSIS — M5382 Other specified dorsopathies, cervical region: Secondary | ICD-10-CM | POA: Insufficient documentation

## 2014-12-03 DIAGNOSIS — M6281 Muscle weakness (generalized): Secondary | ICD-10-CM

## 2014-12-03 DIAGNOSIS — M6289 Other specified disorders of muscle: Secondary | ICD-10-CM

## 2014-12-03 DIAGNOSIS — M25612 Stiffness of left shoulder, not elsewhere classified: Secondary | ICD-10-CM | POA: Diagnosis not present

## 2014-12-03 NOTE — Therapy (Signed)
[place], [place], [address] Phone: [phone]   Fax:  [phone]  Physical Therapy Treatment  Patient Details  Name: Kimberly [name] MRN: [id number] Date of Birth: [birth year] Referring Provider:  Redmond School, MD  Encounter Date: 12/03/2014      PT End of Session - 12/03/14 0957    Visit Number 5   Number of Visits 16   Date for PT Re-Evaluation 12/08/14   Authorization Type UHC    Authorization Time Period 11/10/14 to 01/10/15   Authorization - Visit Number 5   Authorization - Number of Visits 23   PT Start Time 0935   PT Stop Time 1013   PT Time Calculation (min) 38 min   Activity Tolerance Patient tolerated treatment well   Behavior During Therapy [facility] for tasks assessed/performed;Anxious      Past Medical History  Diagnosis Date  . COPD (chronic obstructive pulmonary disease)   . Herpes   . Hypertension     pt. doesn't see cardiologist, followed for HTN by Dr. Gerarda Fraction  . Shortness of breath dyspnea   . Anxiety     takes alprazolam - rare use   . GERD (gastroesophageal reflux disease)     pt. reports that its better, no meds in use at this time- 2015  . Arthritis     cervical spondylosis     Past Surgical History  Procedure Laterality Date  . Abdominal hysterectomy    . Rotator cuff repair Left   . Foreign body removal Left     knee-as child  . Carpal tunnel release Left 02/16/2013    Procedure: CARPAL TUNNEL RELEASE;  Surgeon: Carole Civil, MD;  [facility];  Service: Orthopedics;  Laterality: Left;  . Carpal tunnel release Right 03/27/2013    Procedure: RIGHT CARPAL TUNNEL RELEASE;  Surgeon: Carole Civil, MD;  Location[facility];  Service: Orthopedics;  Laterality: Right;  . Cesarean section      x2  . Anterior cervical decomp/discectomy fusion N/A 08/06/2014    Procedure: ANTERIOR CERVICAL DECOMPRESSION/DISCECTOMY FUSION CERVICAL THREE-FOUR,CERVICAL SIX-SEVEN ,CERVICAL  SEVEN-THORACIC ONE;  Surgeon: Floyce Stakes, MD;  Location: [facility] [facility];  Service: Neurosurgery;  Laterality: N/A;    Filed Vitals:   12/03/14 0941  BP: 127/90    Visit Diagnosis:  Cervical pain  Neck muscle weakness  Decreased range of motion of neck  Shoulder weakness  Decreased range of motion of shoulder, left  Muscle weakness (generalized)  Tight fascia  Decreased range of motion of shoulder, right      Subjective Assessment - 12/03/14 0941    Subjective Pt c/o spasms Rt chest going straight to back.  Went to MD Aline Brochure last week who instructed her to not to receive massage on back or arms.  Pt request check blood pressure initially this session.   Currently in Pain? Yes   Pain Score 5    Pain Location Neck   Pain Orientation Left   Pain Descriptors / Indicators Tightness   Multiple Pain Sites Yes   Pain Score 0  Range increase to 9/10 with chest spasm   Pain Location Chest   Pain Orientation Right   Pain Descriptors / Indicators Spasm;Sharp   Pain Radiating Towards Rt chest to Rt scapula            [facility] PT Assessment - 12/03/14 0001    Assessment   Medical Diagnosis spondylosis without myelopathy or radiculopathy  Onset Date 08/11/13   Next MD Visit Joya Salm 01/13/2015; Hospital for MRI then [facility] Adult PT Treatment/Exercise - 12/03/14 0001    Exercises   Exercises Neck   Neck Exercises: Theraband   Shoulder Extension 10 reps;Red   Neck Exercises: Seated   Neck Retraction 10 reps;3 secs   Cervical Rotation Both;10 reps   Cervical Rotation Limitations 3D cervical excursion   Lateral Flexion Both;10 reps   Lateral Flexion Limitations 3D cervical excursion   W Back 10 reps   Other Seated Exercise 3D thoracic excursion 10 with arms across chest; 10 rows   Other Seated Exercise diaphragmatic breathing   Neck Exercises: Stretches   Upper Trapezius Stretch 3 reps;30 seconds   Upper Trapezius Stretch Limitations hand on side of  chair and sidebend as tolerated   Corner Stretch --   Other Neck Stretches Uni pec stretch Bil 10 x3"                PT Education - 12/03/14 1017    Education provided Yes   Education Details instructed diaphragmatic breathing techniques to assist with BP and stress   Methods Demonstration;Explanation   Comprehension Verbalized understanding;Returned demonstration          PT Short Term Goals - 12/03/14 1018    PT SHORT TERM GOAL #1   Title Patient will improve her cervical range of motion by at least 10 degrees on all planes to assist her in better performing functional tasks    Status On-going   PT SHORT TERM GOAL #2   Title Patient will complain of no more than 4/10 pain in her neck during all functional tasks and activities involved in caring for her mother    Status On-going   PT SHORT TERM GOAL #3   Title Patient will demonstrate improved bilateral shoulder  and shoulder girdle strength to at least 4/5 in order to improve regional stability and reduce pain    Status On-going   PT SHORT TERM GOAL #4   Title Patient will be able to verbally state the importance of maintaining good posture in reducing her overall pain and will also be able to maintain good posture and cervical mechanics 90% of the time    Status On-going           PT Long Term Goals - 12/03/14 1018    PT LONG TERM GOAL #1   Title Patient will be independent in correctly and consistently performing appropriate HEP in order to assist in the management of her condition   PT LONG TERM GOAL #2   Title Patient will demonstrate at least 4/5 cervical strength on all planes of motion and will demonstrate at least 4+/5 in bilateral shoulder/shoulder girdle musculature    PT LONG TERM GOAL #3   Title Patient will demonstrate the ability to easily perform cervical rotation either direction at least 70 degrees with pain no more than 1/10 in order to allow her to safely drive and perform functional tasks    PT  LONG TERM GOAL #4   Title Patient will be able to lie on her back with cervical pain at 0/10 in order to allow her to get a good night's sleep and reduce environmental stress                Plan - 12/03/14 1011    Clinical Impression Statement Pt concerned with chest pain and worried about blood  pressure initially this session.  BP initially at 161/98, pt given water and instructed diaphragmatic breathing techniques, 5 minutes later BP lowered to 127/90.  Session focus on improving cervical mobilty with excursion exercises and stretches complete per pt. tolerance.  Reviewed importance of proper posture and completed some postural strengthening exercises per pt. tolerance.  Held manual this session per pt. stated MD instructed her not to receive manual until MRI results are in.  End of session pt reported pain reduced and increased flexibilty.     PT Next Visit Plan Functional stretching and strengthening, postural training, review technique with cervical retraction for correct form.    Resume remaining therex per pt. tolerance  Progress to redband posture strengthening as able.        Problem List Patient Active Problem List   Diagnosis Date Noted  . Cervical stenosis of spinal canal 08/06/2014  . Muscle weakness (generalized) 11/12/2013  . Tight fascia 11/12/2013  . Decreased range of motion of shoulder 11/12/2013  . Cervical spondylosis without myelopathy 10/29/2013  . S/P carpal tunnel release 02/19/2013  . CTS (carpal tunnel syndrome) 02/19/2013  . SHOULDER PAIN 10/06/2007  . IMPINGEMENT SYNDROME 10/06/2007  . RUPTURE ROTATOR CUFF 10/06/2007  . HIGH BLOOD PRESSURE 10/03/2007   Ihor Austin, [place]; [place] #[id number] [phone]  Aldona Lento 12/03/2014, 10:21 AM  [place], [place], [address] Phone: [phone]   Fax:  [phone]

## 2014-12-08 ENCOUNTER — Ambulatory Visit (HOSPITAL_COMMUNITY): Payer: 59 | Admitting: Physical Therapy

## 2014-12-08 DIAGNOSIS — M542 Cervicalgia: Secondary | ICD-10-CM

## 2014-12-08 DIAGNOSIS — M5382 Other specified dorsopathies, cervical region: Secondary | ICD-10-CM

## 2014-12-08 DIAGNOSIS — R29898 Other symptoms and signs involving the musculoskeletal system: Secondary | ICD-10-CM

## 2014-12-08 DIAGNOSIS — M6281 Muscle weakness (generalized): Secondary | ICD-10-CM

## 2014-12-08 DIAGNOSIS — M25612 Stiffness of left shoulder, not elsewhere classified: Secondary | ICD-10-CM

## 2014-12-08 NOTE — Therapy (Signed)
[name], [place], [address] Phone: [phone]   Fax:  [phone]  Physical Therapy Treatment (Re-Assessment)  Patient Details  Name: Kimberly [name] MRN: [id number] Date of Birth: 1961-01-13 Referring Provider:  Redmond School, MD  Encounter Date: 12/08/2014      PT End of Session - 12/08/14 1151    Visit Number 6   Number of Visits 16   Date for PT Re-Evaluation 01/05/15   Authorization Type U[employer]    Authorization Time Period 11/10/14 to 01/10/15   Authorization - Visit Number 6   Authorization - Number of Visits 23   PT Start Time 1100   PT Stop Time 1143   PT Time Calculation (min) 43 min   Activity Tolerance Patient tolerated treatment well   Behavior During Therapy [facility] for tasks assessed/performed      Past Medical History  Diagnosis Date  . COPD (chronic obstructive pulmonary disease)   . Herpes   . Hypertension     pt. doesn't see cardiologist, followed for HTN by Dr. Gerarda Fraction  . Shortness of breath dyspnea   . Anxiety     takes alprazolam - rare use   . GERD (gastroesophageal reflux disease)     pt. reports that its better, no meds in use at this time- 2015  . Arthritis     cervical spondylosis     Past Surgical History  Procedure Laterality Date  . Abdominal hysterectomy    . Rotator cuff repair Left   . Foreign body removal Left     knee-as child  . Carpal tunnel release Left 02/16/2013    Procedure: CARPAL TUNNEL RELEASE;  Surgeon: Carole Civil, MD;  Location: [facility];  Service: Orthopedics;  Laterality: Left;  . Carpal tunnel release Right 03/27/2013    Procedure: RIGHT CARPAL TUNNEL RELEASE;  Surgeon: Carole Civil, MD;  Location: [facility];  Service: Orthopedics;  Laterality: Right;  . Cesarean section      x2  . Anterior cervical decomp/discectomy fusion N/A 08/06/2014    Procedure: ANTERIOR CERVICAL DECOMPRESSION/DISCECTOMY FUSION CERVICAL THREE-FOUR,CERVICAL SIX-SEVEN  ,CERVICAL SEVEN-THORACIC ONE;  Surgeon: Floyce Stakes, MD;  Location: [place];  Service: Neurosurgery;  Laterality: N/A;    There were no vitals filed for this visit.  Visit Diagnosis:  Cervical pain  Neck muscle weakness  Decreased range of motion of neck  Shoulder weakness  Decreased range of motion of shoulder, left  Muscle weakness (generalized)      Subjective Assessment - 12/08/14 1103    Pertinent History Problems with neck and back started about a year and a half ago; was laid off January 2015. Before she was laid off, she was doing the work of 3 positions with a lot of head turning, stress on her neck. Went to multiple MDs about pain.    How long can you sit comfortably? 5/11- 30-45 minutes    How long can you stand comfortably? More limited by breathing due to COPD   How long can you walk comfortably? Mostly limited by COPD; neck and back are not as much a problem   Patient Stated Goals get motion back in neck, especially rotation, without pain   Currently in Pain? Yes   Pain Score 2   however has been up to 7-8/10 recently at worst    Pain Location Neck   Pain Orientation Left            [facility] PT Assessment -  12/08/14 0001    Assessment   Medical Diagnosis spondylosis without myelopathy or radiculopathy    Onset Date 08/11/13   Next MD Visit Joya Salm 01/13/2015; Hospital [facility] then [facility]    AROM   Right Shoulder Flexion 160 Degrees   Right Shoulder ABduction 155 Degrees   Right Shoulder Internal Rotation 90 Degrees   Right Shoulder External Rotation 60 Degrees  pain limited   Left Shoulder Flexion 85 Degrees   Left Shoulder ABduction 90 Degrees   Left Shoulder Internal Rotation 60 Degrees  pain limited    Left Shoulder External Rotation 20 Degrees  pain limited    Cervical Flexion 64   Cervical Extension 29   Cervical - Right Side Bend 35   Cervical - Left Side Bend 30   Cervical - Right Rotation 45   Cervical - Left Rotation 57   Strength    Right Shoulder Flexion 4/5   Right Shoulder ABduction 4-/5   Right Shoulder Internal Rotation 4/5   Right Shoulder External Rotation 4-/5   Left Shoulder Flexion 3-/5  pain limited    Left Shoulder ABduction 3-/5  pain limited    Left Shoulder Internal Rotation 3+/5  pain limited    Left Shoulder External Rotation 3-/5  pain limited   Cervical Flexion 4-/5   Cervical Extension 3+/5   Cervical - Right Side Bend 3+/5   Cervical - Left Side Bend 3/5   Cervical - Right Rotation 3+/5   Cervical - Left Rotation 3/5  pain limited                      [facility] Adult PT Treatment/Exercise - 12/08/14 0001    Neck Exercises: Seated   Neck Retraction 15 reps   Other Seated Exercise 3D cervical and thoracic excursions 1x10   Other Seated Exercise diaphragmatic breathing                PT Education - 12/08/14 1149    Education provided Yes   Education Details Progress with skilled PT services, plan of care moving forward, effect of stress and COPD on BP and encouraged patient to do diaphragmatic breathing through day    Person(s) Educated Patient   Methods Explanation   Comprehension Verbalized understanding          PT Short Term Goals - 12/08/14 1159    PT SHORT TERM GOAL #1   Title Patient will improve her cervical range of motion by at least 10 degrees on all planes to assist her in better performing functional tasks    Time 4   Period Weeks   Status On-going   PT SHORT TERM GOAL #2   Title Patient will complain of no more than 4/10 pain in her neck during all functional tasks and activities involved in caring for her mother    Time 4   Period Weeks   Status On-going   PT SHORT TERM GOAL #3   Title Patient will demonstrate improved bilateral shoulder  and shoulder girdle strength to at least 4/5 in order to improve regional stability and reduce pain    Time 4   Period Weeks   Status On-going   PT SHORT TERM GOAL #4   Title Patient will be able to  verbally state the importance of maintaining good posture in reducing her overall pain and will also be able to maintain good posture and cervical mechanics 90% of the time    Time 4  Period Weeks   Status Achieved           PT Long Term Goals - 12/08/14 1159    PT LONG TERM GOAL #1   Title Patient will be independent in correctly and consistently performing appropriate HEP in order to assist in the management of her condition   Time 8   Period Weeks   Status Achieved   PT LONG TERM GOAL #2   Title Patient will demonstrate at least 4/5 cervical strength on all planes of motion and will demonstrate at least 4+/5 in bilateral shoulder/shoulder girdle musculature    Time 8   Period Weeks   Status On-going   PT LONG TERM GOAL #3   Title Patient will demonstrate the ability to easily perform cervical rotation either direction at least 70 degrees with pain no more than 1/10 in order to allow her to safely drive and perform functional tasks    Time 8   Period Weeks   Status On-going   PT LONG TERM GOAL #4   Title Patient will be able to lie on her back with cervical pain at 0/10 in order to allow her to get a good night's sleep and reduce environmental stress; patient will also state that she is having 0/10 pain throughout functional activities during her day    Time 8   Period Weeks   Status Revised               Plan - 12/08/14 1152    Clinical Impression Statement Re-assessment performed today. Patient demonstrates some improvements in cervical and R shoulder ROM however shows increased limitation in L shoulder ROM and strength; patient also continues to experience pain in her neck and L shoulder, and also remains limited in her ability to participate in functional tasks and activities. The patient states that she feels like she is getting better in some respects and in others she is staying the same; her MD is performing a MRI on her L shoulder next week to further investigate  the possiblity of L rotator cuff tear. At this time it might be possible that the pain in her L shoulder due to possible rotator cuff injury is contributing to the pain she is experiencing  in her neck. The paitent will likely continue to benefit from skilled PT services in order to address her remaining deficits and optimize level of function; therapy staff to follow up with MD after results of MRI to L shoulder regarding continuing with therapy.    Pt will benefit from skilled therapeutic intervention in order to improve on the following deficits Decreased coordination;Decreased range of motion;Impaired flexibility;Improper body mechanics;Postural dysfunction;Decreased activity tolerance;Decreased balance;Pain;Impaired UE functional use;Increased muscle spasms;Decreased mobility;Decreased strength;Impaired perceived functional ability   Rehab Potential Good   PT Frequency 2x / week   PT Duration 2 weeks   PT Treatment/Interventions ADLs/Self Care Home Management;Neuromuscular re-education;Biofeedback;Functional mobility training;Patient/family education;Cryotherapy;Therapeutic activities;Therapeutic exercise;Manual techniques;Energy conservation;Moist Heat;Balance training;Traction   PT Next Visit Plan Functional stretching and strengthening, postural training, review technique with cervical retraction for correct form.    Resume remaining therex per pt. tolerance  Progress to redband posture strengthening as able. Focus on scapular stabilizers for strength. After MRI is done, follow up with MD regarding plan of care moving forward.    PT Home Exercise Plan cervical rotation/flexion/extension/lateral flexion/retraction/protraction, thoracic spine excursions    Consulted and Agree with Plan of Care Patient        Problem List Patient Active Problem List  Diagnosis Date Noted  . Cervical stenosis of spinal canal 08/06/2014  . Muscle weakness (generalized) 11/12/2013  . Tight fascia 11/12/2013  .  Decreased range of motion of shoulder 11/12/2013  . Cervical spondylosis without myelopathy 10/29/2013  . S/P carpal tunnel release 02/19/2013  . CTS (carpal tunnel syndrome) 02/19/2013  . SHOULDER PAIN 10/06/2007  . IMPINGEMENT SYNDROME 10/06/2007  . RUPTURE ROTATOR CUFF 10/06/2007  . HIGH BLOOD PRESSURE 10/03/2007    Deniece Ree PT, DPT [facility] [address] [place], [place], [address] Phone: [phone]   Fax:  [phone]

## 2014-12-10 ENCOUNTER — Ambulatory Visit (HOSPITAL_COMMUNITY): Payer: 59

## 2014-12-10 ENCOUNTER — Other Ambulatory Visit: Payer: Self-pay | Admitting: Radiology

## 2014-12-10 DIAGNOSIS — M6281 Muscle weakness (generalized): Secondary | ICD-10-CM

## 2014-12-10 DIAGNOSIS — M25611 Stiffness of right shoulder, not elsewhere classified: Secondary | ICD-10-CM

## 2014-12-10 DIAGNOSIS — M25612 Stiffness of left shoulder, not elsewhere classified: Secondary | ICD-10-CM

## 2014-12-10 DIAGNOSIS — M542 Cervicalgia: Secondary | ICD-10-CM

## 2014-12-10 DIAGNOSIS — M5382 Other specified dorsopathies, cervical region: Secondary | ICD-10-CM

## 2014-12-10 DIAGNOSIS — R29898 Other symptoms and signs involving the musculoskeletal system: Secondary | ICD-10-CM

## 2014-12-10 DIAGNOSIS — M629 Disorder of muscle, unspecified: Secondary | ICD-10-CM

## 2014-12-10 DIAGNOSIS — M75102 Unspecified rotator cuff tear or rupture of left shoulder, not specified as traumatic: Secondary | ICD-10-CM

## 2014-12-10 DIAGNOSIS — M6289 Other specified disorders of muscle: Secondary | ICD-10-CM

## 2014-12-10 NOTE — Therapy (Signed)
[place], [place], [address] Phone: [phone]   Fax:  [phone]  Physical Therapy Treatment  Patient Details  Name: Kimberly [name] MRN: [id number] Date of Birth: August 23, 1960 Referring Provider:  Redmond [employer], MD  Encounter Date: 12/10/2014      PT End of Session - 12/10/14 1112    Visit Number 7   Number of Visits 16   Date for PT Re-Evaluation 01/05/15   Authorization Type UHC    Authorization Time Period 11/10/14 to 01/10/15   Authorization - Visit Number 7   Authorization - Number of Visits 23   PT Start Time 8366   PT Stop Time 1135   PT Time Calculation (min) 32 min   Activity Tolerance Patient tolerated treatment well   Behavior During Therapy [facility] for tasks assessed/performed      Past Medical History  Diagnosis Date  . COPD (chronic obstructive pulmonary disease)   . Herpes   . Hypertension     pt. doesn't see cardiologist, followed for HTN by Dr. Gerarda Fraction  . Shortness of breath dyspnea   . Anxiety     takes alprazolam - rare use   . GERD (gastroesophageal reflux disease)     pt. reports that its better, no meds in use at this time- 2015  . Arthritis     cervical spondylosis     Past Surgical History  Procedure Laterality Date  . Abdominal hysterectomy    . Rotator cuff repair Left   . Foreign body removal Left     knee-as child  . Carpal tunnel release Left 02/16/2013    Procedure: CARPAL TUNNEL RELEASE;  Surgeon: Carole Civil, MD;  Location: [facility];  Service: Orthopedics;  Laterality: Left;  . Carpal tunnel release Right 03/27/2013    Procedure: RIGHT CARPAL TUNNEL RELEASE;  Surgeon: Carole Civil, MD;  Location: [facility];  Service: Orthopedics;  Laterality: Right;  . Cesarean section      x2  . Anterior cervical decomp/discectomy fusion N/A 08/06/2014    Procedure: ANTERIOR CERVICAL DECOMPRESSION/DISCECTOMY FUSION CERVICAL THREE-FOUR,CERVICAL SIX-SEVEN ,CERVICAL  SEVEN-THORACIC ONE;  Surgeon: Floyce Stakes, MD;  Location: [place];  Service: Neurosurgery;  Laterality: N/A;    There were no vitals filed for this visit.  Visit Diagnosis:  Cervical pain  Neck muscle weakness  Decreased range of motion of neck  Shoulder weakness  Decreased range of motion of shoulder, left  Muscle weakness (generalized)  Tight fascia  Decreased range of motion of shoulder, right      Subjective Assessment - 12/10/14 1106    Subjective Pt stated pain scale 2/10 acheieness Lt neck, shoulder and scapula radiating down back believes related to rain.  Goes to [place] next week for MRI to check for rotator cuff tear.   Currently in Pain? Yes   Pain Score 2    Pain Location Neck   Pain Orientation Left   Pain Descriptors / Indicators Aching               [facility] Adult PT Treatment/Exercise - 12/10/14 0001    Exercises   Exercises Neck   Neck Exercises: Theraband   Rows 10 reps;Red   Neck Exercises: Standing   Neck Retraction 10 reps;5 secs   Neck Retraction Limitations infront of door for posture awareness   Neck Exercises: Seated   Neck Retraction 15 reps;5 secs   Neck Retraction Limitations therapist facilitation   W Back 10 reps  Other Seated Exercise 3D cervical and thoracic excursions with arm crossed on chest 1x10   Neck Exercises: Sidelying   Lateral Flexion Both;5 reps   Neck Exercises: Prone   Neck Retraction 10 reps   Neck Retraction Limitations chin tuck head lift 10x 5"              PT Short Term Goals - 12/10/14 1139    PT SHORT TERM GOAL #1   Title Patient will improve her cervical range of motion by at least 10 degrees on all planes to assist her in better performing functional tasks    Status On-going   PT SHORT TERM GOAL #2   Title Patient will complain of no more than 4/10 pain in her neck during all functional tasks and activities involved in caring for her mother    Status On-going   PT SHORT TERM GOAL #3    Title Patient will demonstrate improved bilateral shoulder  and shoulder girdle strength to at least 4/5 in order to improve regional stability and reduce pain    PT SHORT TERM GOAL #4   Title Patient will be able to verbally state the importance of maintaining good posture in reducing her overall pain and will also be able to maintain good posture and cervical mechanics 90% of the time    Status Achieved           PT Long Term Goals - 12/10/14 1140    PT LONG TERM GOAL #1   Title Patient will be independent in correctly and consistently performing appropriate HEP in order to assist in the management of her condition   Status Achieved   PT LONG TERM GOAL #2   Title Patient will demonstrate at least 4/5 cervical strength on all planes of motion and will demonstrate at least 4+/5 in bilateral shoulder/shoulder girdle musculature    Status On-going   PT LONG TERM GOAL #3   Title Patient will demonstrate the ability to easily perform cervical rotation either direction at least 70 degrees with pain no more than 1/10 in order to allow her to safely drive and perform functional tasks    Status On-going   PT LONG TERM GOAL #4   Title Patient will be able to lie on her back with cervical pain at 0/10 in order to allow her to get a good night's sleep and reduce environmental stress; patient will also state that she is having 0/10 pain throughout functional activities during her day                Plan - 12/10/14 1113    Clinical Impression Statement Session focus on improving cervical and thoracic mobilty without UE movements and improving postural strengthening within pain tolerance.  Minimal UE movements complete this session following s/s of possible rotator cuff tear, pt with apt for MRI next Tuesday .  Did complete cervical and scapular retraction exercises for postural strengthening within pain tolerance.     PT Next Visit Plan Functional stretching and strengthening, postural training,  review technique with cervical retraction for correct form.    Resume remaining therex per pt. tolerance  Progress to redband posture strengthening as able. Focus on scapular stabilizers for strength. After MRI is done, follow up with MD regarding plan of care moving forward.         Problem List Patient Active Problem List   Diagnosis Date Noted  . Cervical stenosis of spinal canal 08/06/2014  . Muscle weakness (generalized) 11/12/2013  .  Tight fascia 11/12/2013  . Decreased range of motion of shoulder 11/12/2013  . Cervical spondylosis without myelopathy 10/29/2013  . S/P carpal tunnel release 02/19/2013  . CTS (carpal tunnel syndrome) 02/19/2013  . SHOULDER PAIN 10/06/2007  . IMPINGEMENT SYNDROME 10/06/2007  . RUPTURE ROTATOR CUFF 10/06/2007  . HIGH BLOOD PRESSURE 10/03/2007   Ihor Austin, [place]; [place] [id number] [phone]  Aldona Lento 12/10/2014, 11:41 AM  [place], [place], [address] Phone: [phone]   Fax:  [phone]

## 2014-12-14 ENCOUNTER — Ambulatory Visit (HOSPITAL_COMMUNITY): Payer: 59

## 2014-12-14 ENCOUNTER — Ambulatory Visit (HOSPITAL_COMMUNITY)
Admission: RE | Admit: 2014-12-14 | Discharge: 2014-12-14 | Disposition: A | Discharge status: Home / Self Care | Payer: 59 | Source: Ambulatory Visit | Attending: Orthopedic Surgery | Admitting: Orthopedic Surgery

## 2014-12-14 ENCOUNTER — Other Ambulatory Visit: Payer: Self-pay | Admitting: Orthopedic Surgery

## 2014-12-14 DIAGNOSIS — Z9889 Other specified postprocedural states: Secondary | ICD-10-CM

## 2014-12-14 DIAGNOSIS — M25512 Pain in left shoulder: Secondary | ICD-10-CM | POA: Insufficient documentation

## 2014-12-14 DIAGNOSIS — M75102 Unspecified rotator cuff tear or rupture of left shoulder, not specified as traumatic: Secondary | ICD-10-CM

## 2014-12-14 MED ORDER — GADOBENATE DIMEGLUMINE 529 MG/ML IV SOLN
5.0000 mL | Freq: Once | INTRAVENOUS | Status: AC | PRN
  Administered 2014-12-14: 0.1 mL via INTRAVENOUS

## 2014-12-14 MED ORDER — LIDOCAINE HCL (PF) 2 % IJ SOLN
INTRAMUSCULAR | Status: AC
  Filled 2014-12-14: qty 10

## 2014-12-14 MED ORDER — IOHEXOL 300 MG/ML  SOLN
50.0000 mL | Freq: Once | INTRAMUSCULAR | Status: AC | PRN
  Administered 2014-12-14: 15 mL via INTRAVENOUS

## 2014-12-15 ENCOUNTER — Encounter (HOSPITAL_COMMUNITY): Payer: 59 | Admitting: Physical Therapy

## 2014-12-17 ENCOUNTER — Telehealth: Payer: Self-pay | Admitting: Orthopedic Surgery

## 2014-12-17 ENCOUNTER — Ambulatory Visit (HOSPITAL_COMMUNITY): Payer: 59 | Admitting: Physical Therapy

## 2014-12-17 DIAGNOSIS — M542 Cervicalgia: Secondary | ICD-10-CM

## 2014-12-17 DIAGNOSIS — M25612 Stiffness of left shoulder, not elsewhere classified: Secondary | ICD-10-CM

## 2014-12-17 DIAGNOSIS — R29898 Other symptoms and signs involving the musculoskeletal system: Secondary | ICD-10-CM

## 2014-12-17 DIAGNOSIS — M5382 Other specified dorsopathies, cervical region: Secondary | ICD-10-CM

## 2014-12-17 NOTE — Telephone Encounter (Signed)
Patient is calling asking for MRI Results of left shoulder that were done 12/14/14, please advise?

## 2014-12-17 NOTE — Telephone Encounter (Signed)
Tell her no tear  She'll have to go back to rehab OT/PT  Doesn't need surgery

## 2014-12-17 NOTE — Therapy (Signed)
[place], [place], [address] Phone: [phone]   Fax:  [phone]  Physical Therapy Treatment  Patient Details  Name: Kimberly [name] MRN: [id number] Date of Birth: 19-Dec-1960 Referring Provider:  Leeroy Cha, MD  Encounter Date: 12/17/2014      PT End of Session - 12/17/14 1235    Visit Number 8   Number of Visits 16   Date for PT Re-Evaluation 01/05/15   Authorization Type U[employer]    Authorization Time Period 11/10/14 to 01/10/15   Authorization - Visit Number 8   Authorization - Number of Visits 23   Activity Tolerance Patient tolerated treatment well   Behavior During Therapy [facility] for tasks assessed/performed      Past Medical History  Diagnosis Date  . COPD (chronic obstructive pulmonary disease)   . Herpes   . Hypertension     pt. doesn't see cardiologist, followed for HTN by Dr. Gerarda Fraction  . Shortness of breath dyspnea   . Anxiety     takes alprazolam - rare use   . GERD (gastroesophageal reflux disease)     pt. reports that its better, no meds in use at this time- 2015  . Arthritis     cervical spondylosis     Past Surgical History  Procedure Laterality Date  . Abdominal hysterectomy    . Rotator cuff repair Left   . Foreign body removal Left     knee-as child  . Carpal tunnel release Left 02/16/2013    Procedure: CARPAL TUNNEL RELEASE;  Surgeon: Carole Civil, MD;  Location: [facility];  Service: Orthopedics;  Laterality: Left;  . Carpal tunnel release Right 03/27/2013    Procedure: RIGHT CARPAL TUNNEL RELEASE;  Surgeon: Carole Civil, MD;  Location: [facility];  Service: Orthopedics;  Laterality: Right;  . Cesarean section      x2  . Anterior cervical decomp/discectomy fusion N/A 08/06/2014    Procedure: ANTERIOR CERVICAL DECOMPRESSION/DISCECTOMY FUSION CERVICAL THREE-FOUR,CERVICAL SIX-SEVEN ,CERVICAL SEVEN-THORACIC ONE;  Surgeon: Floyce Stakes, MD;  Location: [place];  Service:  Neurosurgery;  Laterality: N/A;    There were no vitals filed for this visit.  Visit Diagnosis:  Cervical pain  Neck muscle weakness  Decreased range of motion of neck  Shoulder weakness  Decreased range of motion of shoulder, left      Subjective Assessment - 12/17/14 1219    Subjective Patient states that the MRI was done on her L shoulder however she has not heard the results yet and is very anxious about this; continues to have significant pain L shoulder.    Pertinent History Problems with neck and back started about a year and a half ago; was laid off January 2015. Before she was laid off, she was doing the work of 3 positions with a lot of head turning, stress on her neck. Went to multiple MDs about pain.    Currently in Pain? Yes   Pain Score 5    Pain Location Shoulder   Pain Orientation Left            [facility] PT Assessment - 12/17/14 0001    Observation/Other Assessments   Observations positive empty can and horizontal ADDuction of L shoulder tests, tender supraspinatus, inability to tolerate MMT of L shoulder ABD and ER                     [facility] Adult PT Treatment/Exercise - 12/17/14 0001  Elbow Exercises   Elbow Flexion Strengthening;Left;15 reps   Bar Weights/Barbell (Elbow Flexion) 3 lbs   Forearm Supination Left;15 reps;Seated   Forearm Supination Limitations 1#   Forearm Pronation Left;15 reps;Seated   Forearm Pronation Limitations 1#   Wrist Flexion Left;15 reps   Bar Weights/Barbell (Wrist Flexion) 1 lb   Wrist Extension Left;15 reps   Bar Weights/Barbell (Wrist Extension) 1 lb   Neck Exercises: Seated   Neck Retraction 15 reps   Shoulder Shrugs 15 reps   Shoulder Rolls 15 reps   Other Seated Exercise 3D cervical and thoracic excursions in sitting 1x15   Other Seated Exercise diaphragmatic breathing 2x30                PT Education - 12/17/14 1223    Education provided Yes   Education Details extensive education  providing MD interpretation of MRI, plan of care moving forwards, symptoms of potential rotator cuff tear that remain present/worsening   Person(s) Educated Patient   Methods Explanation   Comprehension Verbalized understanding          PT Short Term Goals - 12/10/14 1139    PT SHORT TERM GOAL #1   Title Patient will improve her cervical range of motion by at least 10 degrees on all planes to assist her in better performing functional tasks    Status On-going   PT SHORT TERM GOAL #2   Title Patient will complain of no more than 4/10 pain in her neck during all functional tasks and activities involved in caring for her mother    Status On-going   PT SHORT TERM GOAL #3   Title Patient will demonstrate improved bilateral shoulder  and shoulder girdle strength to at least 4/5 in order to improve regional stability and reduce pain    PT SHORT TERM GOAL #4   Title Patient will be able to verbally state the importance of maintaining good posture in reducing her overall pain and will also be able to maintain good posture and cervical mechanics 90% of the time    Status Achieved           PT Long Term Goals - 12/10/14 1140    PT LONG TERM GOAL #1   Title Patient will be independent in correctly and consistently performing appropriate HEP in order to assist in the management of her condition   Status Achieved   PT LONG TERM GOAL #2   Title Patient will demonstrate at least 4/5 cervical strength on all planes of motion and will demonstrate at least 4+/5 in bilateral shoulder/shoulder girdle musculature    Status On-going   PT LONG TERM GOAL #3   Title Patient will demonstrate the ability to easily perform cervical rotation either direction at least 70 degrees with pain no more than 1/10 in order to allow her to safely drive and perform functional tasks    Status On-going   PT LONG TERM GOAL #4   Title Patient will be able to lie on her back with cervical pain at 0/10 in order to allow her  to get a good night's sleep and reduce environmental stress; patient will also state that she is having 0/10 pain throughout functional activities during her day                Plan - 12/17/14 1235    Clinical Impression Statement Reviewed MD interpretation of MRI with patient; extensive education regarding current shoulder symptoms and possible relation to possible rotator cuff tear.  Educated patient on plan of care moving forward as well as following up with Dr. Gerarda Fraction regarding L shoulder. Performed cervical spine and L upper extremity exercises with exception of L shoulder flexion/ABD/IR/ER at this time since all exercises were performed in sitting today and patient having increased levels of pain in L shoulder. Introduced L elbow and wrist exercises today.   Pt will benefit from skilled therapeutic intervention in order to improve on the following deficits Decreased coordination;Decreased range of motion;Impaired flexibility;Improper body mechanics;Postural dysfunction;Decreased activity tolerance;Decreased balance;Pain;Impaired UE functional use;Increased muscle spasms;Decreased mobility;Decreased strength;Impaired perceived functional ability   Rehab Potential Good   PT Frequency 2x / week   PT Duration 2 weeks   PT Treatment/Interventions ADLs/Self Care Home Management;Neuromuscular re-education;Biofeedback;Functional mobility training;Patient/family education;Cryotherapy;Therapeutic activities;Therapeutic exercise;Manual techniques;Energy conservation;Moist Heat;Balance training;Traction   PT Next Visit Plan  Educate patient that we will need referral for shoulder to directly treat this as initial referral was for neck.  Functional stretching and strengthening of c-spine and L UE. Marland Kitchen  Postural training. Check in on if patient has seen Dr. Gerarda Fraction.    PT Home Exercise Plan cervical rotation/flexion/extension/lateral flexion/retraction/protraction, thoracic spine excursions    Consulted and  Agree with Plan of Care Patient        Problem List Patient Active Problem List   Diagnosis Date Noted  . Cervical stenosis of spinal canal 08/06/2014  . Muscle weakness (generalized) 11/12/2013  . Tight fascia 11/12/2013  . Decreased range of motion of shoulder 11/12/2013  . Cervical spondylosis without myelopathy 10/29/2013  . S/P carpal tunnel release 02/19/2013  . CTS (carpal tunnel syndrome) 02/19/2013  . SHOULDER PAIN 10/06/2007  . IMPINGEMENT SYNDROME 10/06/2007  . RUPTURE ROTATOR CUFF 10/06/2007  . HIGH BLOOD PRESSURE 10/03/2007    Deniece Ree PT, DPT [place] [address] [place], [place], [address] Phone: [phone]   Fax:  [phone]

## 2014-12-20 ENCOUNTER — Telehealth: Payer: Self-pay | Admitting: Orthopedic Surgery

## 2014-12-20 NOTE — Telephone Encounter (Signed)
i have no advice   PT has no business telling a patient that   i gave my advice already

## 2014-12-20 NOTE — Telephone Encounter (Signed)
Patient aware.

## 2014-12-20 NOTE — Telephone Encounter (Signed)
Physical Therapy has told Kimberly [name] that per MRI Report of rotator cuff, she needs a second opinion because they telling her that the Radiologist is seeing more than Dr. Aline Brochure is stating and now she is worried, please advise?

## 2014-12-20 NOTE — Telephone Encounter (Signed)
New phone note

## 2014-12-20 NOTE — Telephone Encounter (Signed)
Forwarding to [employer]

## 2014-12-20 NOTE — Telephone Encounter (Signed)
See below

## 2014-12-22 ENCOUNTER — Ambulatory Visit (HOSPITAL_COMMUNITY): Payer: 59 | Admitting: Physical Therapy

## 2014-12-22 DIAGNOSIS — M25612 Stiffness of left shoulder, not elsewhere classified: Secondary | ICD-10-CM

## 2014-12-22 DIAGNOSIS — M542 Cervicalgia: Secondary | ICD-10-CM

## 2014-12-22 DIAGNOSIS — M6281 Muscle weakness (generalized): Secondary | ICD-10-CM

## 2014-12-22 DIAGNOSIS — M6289 Other specified disorders of muscle: Secondary | ICD-10-CM

## 2014-12-22 DIAGNOSIS — R29898 Other symptoms and signs involving the musculoskeletal system: Secondary | ICD-10-CM

## 2014-12-22 DIAGNOSIS — M5382 Other specified dorsopathies, cervical region: Secondary | ICD-10-CM

## 2014-12-22 DIAGNOSIS — M629 Disorder of muscle, unspecified: Secondary | ICD-10-CM

## 2014-12-22 NOTE — Therapy (Signed)
[place], [place], [address] Phone: [phone]   Fax:  [phone]  Physical Therapy Treatment  Patient Details  Name: Kimberly [name] MRN: [id number] Date of Birth: 09-Mar-1961 Referring Provider:  Redmond School, MD  Encounter Date: 12/22/2014      PT End of Session - 12/22/14 1124    Visit Number 9   Number of Visits 16   Date for PT Re-Evaluation 01/05/15   Authorization Type UHC    Authorization Time Period 11/10/14 to 01/10/15   Authorization - Visit Number 9   Authorization - Number of Visits 23   PT Start Time 2542   PT Stop Time 1142   PT Time Calculation (min) 39 min   Activity Tolerance Patient tolerated treatment well   Behavior During Therapy [facility] for tasks assessed/performed      Past Medical History  Diagnosis Date  . COPD (chronic obstructive pulmonary disease)   . Herpes   . Hypertension     pt. doesn't see cardiologist, followed for HTN by Dr. Gerarda Fraction  . Shortness of breath dyspnea   . Anxiety     takes alprazolam - rare use   . GERD (gastroesophageal reflux disease)     pt. reports that its better, no meds in use at this time- 2015  . Arthritis     cervical spondylosis     Past Surgical History  Procedure Laterality Date  . Abdominal hysterectomy    . Rotator cuff repair Left   . Foreign body removal Left     knee-as child  . Carpal tunnel release Left 02/16/2013    Procedure: CARPAL TUNNEL RELEASE;  Surgeon: Carole Civil, MD;  Location: [facility];  Service: Orthopedics;  Laterality: Left;  . Carpal tunnel release Right 03/27/2013    Procedure: RIGHT CARPAL TUNNEL RELEASE;  Surgeon: Carole Civil, MD;  Location: [facility];  Service: Orthopedics;  Laterality: Right;  . Cesarean section      x2  . Anterior cervical decomp/discectomy fusion N/A 08/06/2014    Procedure: ANTERIOR CERVICAL DECOMPRESSION/DISCECTOMY FUSION CERVICAL THREE-FOUR,CERVICAL SIX-SEVEN ,CERVICAL  SEVEN-THORACIC ONE;  Surgeon: Floyce Stakes, MD;  Location: [place];  Service: Neurosurgery;  Laterality: N/A;    There were no vitals filed for this visit.  Visit Diagnosis:  Cervical pain  Neck muscle weakness  Decreased range of motion of neck  Shoulder weakness  Decreased range of motion of shoulder, left  Muscle weakness (generalized)  Tight fascia      Subjective Assessment - 12/22/14 1120    Subjective Pt states Dr. Gerarda Fraction approved a second opinion for her Lt shoulder.  States her neck is doing better, however her Lt shoulder continues to be painful at 7/10 and is having difficulty using and moving her Lt UE.    Currently in Pain? Yes   Pain Score 3    Pain Location Neck   Pain Score 7   Pain Location Shoulder   Pain Orientation Left                         [facility] Adult PT Treatment/Exercise - 12/22/14 1115    Neck Exercises: Standing   Neck Retraction 10 reps;5 secs   Neck Exercises: Seated   Neck Retraction 15 reps   W Back 10 reps   Shoulder Shrugs 15 reps   Shoulder Rolls 15 reps   Other Seated Exercise 3D cervical and thoracic excursions in  sitting 1x15   Neck Exercises: Stretches   Upper Trapezius Stretch 3 reps;30 seconds   Upper Trapezius Stretch Limitations hand on side of chair and sidebend as tolerated                  PT Short Term Goals - 12/10/14 1139    PT SHORT TERM GOAL #1   Title Patient will improve her cervical range of motion by at least 10 degrees on all planes to assist her in better performing functional tasks    Status On-going   PT SHORT TERM GOAL #2   Title Patient will complain of no more than 4/10 pain in her neck during all functional tasks and activities involved in caring for her mother    Status On-going   PT SHORT TERM GOAL #3   Title Patient will demonstrate improved bilateral shoulder  and shoulder girdle strength to at least 4/5 in order to improve regional stability and reduce pain    PT  SHORT TERM GOAL #4   Title Patient will be able to verbally state the importance of maintaining good posture in reducing her overall pain and will also be able to maintain good posture and cervical mechanics 90% of the time    Status Achieved           PT Long Term Goals - 12/10/14 1140    PT LONG TERM GOAL #1   Title Patient will be independent in correctly and consistently performing appropriate HEP in order to assist in the management of her condition   Status Achieved   PT LONG TERM GOAL #2   Title Patient will demonstrate at least 4/5 cervical strength on all planes of motion and will demonstrate at least 4+/5 in bilateral shoulder/shoulder girdle musculature    Status On-going   PT LONG TERM GOAL #3   Title Patient will demonstrate the ability to easily perform cervical rotation either direction at least 70 degrees with pain no more than 1/10 in order to allow her to safely drive and perform functional tasks    Status On-going   PT LONG TERM GOAL #4   Title Patient will be able to lie on her back with cervical pain at 0/10 in order to allow her to get a good night's sleep and reduce environmental stress; patient will also state that she is having 0/10 pain throughout functional activities during her day                Plan - 12/22/14 1126    Clinical Impression Statement Session focused on cervical spine.  Reveiwed all exercises to ensure proper form at home.  Pt continues to have pain and immobility in Lt shoulder and now unable to laterally raise or forward flex greater than 90 degrees due to severe pain.  Pt also walks with Lt shoudler in guarded position.  Pt able to complete all cervical exericses wtihou c/o pain and in good form.  Patient and evaluating therapist also discussed further course of treatment with re-evalution planned for next session.   PT Next Visit Plan Re-evaluate next session.   Consulted and Agree with Plan of Care Patient        Problem  List Patient Active Problem List   Diagnosis Date Noted  . Cervical stenosis of spinal canal 08/06/2014  . Muscle weakness (generalized) 11/12/2013  . Tight fascia 11/12/2013  . Decreased range of motion of shoulder 11/12/2013  . Cervical spondylosis without myelopathy 10/29/2013  . S/P  carpal tunnel release 02/19/2013  . CTS (carpal tunnel syndrome) 02/19/2013  . SHOULDER PAIN 10/06/2007  . IMPINGEMENT SYNDROME 10/06/2007  . RUPTURE ROTATOR CUFF 10/06/2007  . HIGH BLOOD PRESSURE 10/03/2007    Teena Irani, PTA/CLT [phone] 12/22/2014, 11:31 AM  [place] [address] [place], [place], [address] Phone: [phone]   Fax:  [phone]

## 2014-12-23 ENCOUNTER — Other Ambulatory Visit (HOSPITAL_COMMUNITY): Payer: Self-pay | Admitting: Neurosurgery

## 2014-12-23 DIAGNOSIS — R1314 Dysphagia, pharyngoesophageal phase: Secondary | ICD-10-CM

## 2014-12-24 ENCOUNTER — Ambulatory Visit (HOSPITAL_COMMUNITY): Payer: 59 | Admitting: Physical Therapy

## 2014-12-24 DIAGNOSIS — M542 Cervicalgia: Secondary | ICD-10-CM

## 2014-12-24 DIAGNOSIS — M25612 Stiffness of left shoulder, not elsewhere classified: Secondary | ICD-10-CM

## 2014-12-24 DIAGNOSIS — R29898 Other symptoms and signs involving the musculoskeletal system: Secondary | ICD-10-CM

## 2014-12-24 DIAGNOSIS — M5382 Other specified dorsopathies, cervical region: Secondary | ICD-10-CM

## 2014-12-24 NOTE — Therapy (Signed)
[facility] [address] [place], [place], [address] Phone: [phone]   Fax:  [phone]  Physical Therapy Treatment (Mini-Reassess)  Patient Details  Name: Kimberly [name] MRN: [id number] Date of Birth: [birth year] Referring Provider:  Leeroy Cha, MD  Encounter Date: 12/24/2014      PT End of Session - 12/24/14 1148    Visit Number 10   Number of Visits 22   Date for PT Re-Evaluation 01/05/15   Authorization Type UHC    Authorization Time Period 11/10/14 to 01/10/15   Authorization - Visit Number 10   Authorization - Number of Visits 23   PT Start Time 8127   PT Stop Time 1140   PT Time Calculation (min) 37 min   Activity Tolerance Patient tolerated treatment well   Behavior During Therapy [facility] for tasks assessed/performed      Past Medical History  Diagnosis Date  . COPD (chronic obstructive pulmonary disease)   . Herpes   . Hypertension     pt. doesn't see cardiologist, followed for HTN by Dr. Gerarda Fraction  . Shortness of breath dyspnea   . Anxiety     takes alprazolam - rare use   . GERD (gastroesophageal reflux disease)     pt. reports that its better, no meds in use at this time- 2015  . Arthritis     cervical spondylosis     Past Surgical History  Procedure Laterality Date  . Abdominal hysterectomy    . Rotator cuff repair Left   . Foreign body removal Left     knee-as child  . Carpal tunnel release Left 02/16/2013    Procedure: CARPAL TUNNEL RELEASE;  Surgeon: Carole Civil, MD;  Location: [facility];  Service: Orthopedics;  Laterality: Left;  . Carpal tunnel release Right 03/27/2013    Procedure: RIGHT CARPAL TUNNEL RELEASE;  Surgeon: Carole Civil, MD;  Location: [facility];  Service: Orthopedics;  Laterality: Right;  . Cesarean section      x2  . Anterior cervical decomp/discectomy fusion N/A 08/06/2014    Procedure: ANTERIOR CERVICAL DECOMPRESSION/DISCECTOMY FUSION CERVICAL THREE-FOUR,CERVICAL SIX-SEVEN  ,CERVICAL SEVEN-THORACIC ONE;  Surgeon: Floyce Stakes, MD;  Location: [facility];  Service: Neurosurgery;  Laterality: N/A;    There were no vitals filed for this visit.  Visit Diagnosis:  Cervical pain - Plan: PT plan of care cert/re-cert  Neck muscle weakness - Plan: PT plan of care cert/re-cert  Decreased range of motion of neck - Plan: PT plan of care cert/re-cert  Shoulder weakness - Plan: PT plan of care cert/re-cert  Decreased range of motion of shoulder, left - Plan: PT plan of care cert/re-cert      Subjective Assessment - 12/24/14 1107    Subjective Patient states that she is having around 3/10 pain in her neck today, also states that she has another appointment scheduled for a second image of her shoulder in early-mid June.    Pertinent History Problems with neck and back started about a year and a half ago; was laid off January 2015. Before she was laid off, she was doing the work of 3 positions with a lot of head turning, stress on her neck. Went to multiple MDs about pain.    Currently in Pain? Yes   Pain Score 3    Pain Location Neck   Pain Orientation Left            [facility] PT Assessment - 12/24/14 0001    AROM  Cervical Flexion 68   Cervical Extension 34   Cervical - Right Side Bend 41   Cervical - Left Side Bend 39   Cervical - Right Rotation 57   Cervical - Left Rotation 55   Strength   Cervical Flexion 4-/5   Cervical Extension 4-/5   Cervical - Right Side Bend 4-/5   Cervical - Left Side Bend 4-/5   Cervical - Right Rotation 4-/5   Cervical - Left Rotation 3/5  pain limited                      [facility] Adult PT Treatment/Exercise - 12/24/14 0001    Neck Exercises: Seated   Shoulder Shrugs 15 reps   Shoulder Rolls Backwards;15 reps   Other Seated Exercise 3D cervical and thoracic excursions 1x15   Other Seated Exercise Scapular retractions 1x20; diaphragmatic breathing 1x30   Neck Exercises: Supine   Neck Retraction 10 reps   Neck  Retraction Limitations 2 sets; gravity present    Other Supine Exercise Cervical flexion 1x10   Neck Exercises: Sidelying   Lateral Flexion 10 reps;Left   Lateral Flexion Limitations unable to lay on L side at this time    Neck Exercises: Prone   Neck Retraction 10 reps   Neck Retraction Limitations 2 sets                PT Education - 12/24/14 1146    Education provided Yes   Education Details plan of care moving forward; encouraged to try sleeping on her back without pillow since this decreased pain today during session, however still encouraged her to use pillow for sidelying during sleep; encouraged to keep up with HEP if there should be gap between this and her next scheduled visit    Person(s) Educated Patient   Methods Explanation   Comprehension Verbalized understanding          PT Short Term Goals - 12/24/14 1115    PT SHORT TERM GOAL #1   Title Patient will improve her cervical range of motion by at least 10 degrees on all planes to assist her in better performing functional tasks    Baseline 5/27- has met in some planes (see assessment section) but not all    Time 4   Period Weeks   Status On-going   PT SHORT TERM GOAL #2   Title Patient will complain of no more than 4/10 pain in her neck during all functional tasks and activities involved in caring for her mother    Baseline 5/27- 4/10 at worst    Period Days   Status Achieved   PT SHORT TERM GOAL #3   Title Patient will demonstrate improved bilateral shoulder  and shoulder girdle strength to at least 4/5 in order to improve regional stability and reduce pain    Baseline 5/27- did not assess    Time 4   Period Weeks   Status On-going   PT SHORT TERM GOAL #4   Title Patient will be able to verbally state the importance of maintaining good posture in reducing her overall pain and will also be able to maintain good posture and cervical mechanics 90% of the time    Time 4   Period Weeks   Status Achieved            PT Long Term Goals - 12/24/14 1154    PT LONG TERM GOAL #1   Title Patient will be independent in correctly and  consistently performing appropriate HEP in order to assist in the management of her condition   Time 8   Period Weeks   Status Achieved   PT LONG TERM GOAL #2   Title Patient will demonstrate at least 4/5 cervical strength on all planes of motion and will demonstrate at least 4+/5 in bilateral shoulder/shoulder girdle musculature    Time 8   Period Weeks   Status On-going   PT LONG TERM GOAL #3   Title Patient will demonstrate the ability to easily perform cervical rotation either direction at least 70 degrees with pain no more than 1/10 in order to allow her to safely drive and perform functional tasks    Time 8   Period Weeks   Status On-going   PT LONG TERM GOAL #4   Title Patient will be able to lie on her back with cervical pain at 0/10 in order to allow her to get a good night's sleep and reduce environmental stress; patient will also state that she is having 0/10 pain throughout functional activities during her day    Time 8   Period Weeks   Status Revised               Plan - 12/24/14 1148    Clinical Impression Statement Mini-reassessment completed today with focus on cervical spine due to severe pain L shoulder. Patient shows improvements in cervical ROM and strength and contintues to progress towards her goals for her cervical spine, will likely benefit from continuation of skilled PT services for approximately 1x/week for 6 additional weeks in order to assist her in performing all exercises with good form and progress cervical exercises/activites as appropriate. Patient continues to experience severe pain in her L shoulder and continues to hold in guarded postiion at this time. Patient overall very motivated to continue moving forward with skilled PT services to continue improving her cervical function and reduce pain. Continue to suspect that at  least some of her cervical impairment may possibly be related to her L shoulder pain.    Pt will benefit from skilled therapeutic intervention in order to improve on the following deficits Decreased coordination;Decreased range of motion;Impaired flexibility;Improper body mechanics;Postural dysfunction;Decreased activity tolerance;Decreased balance;Pain;Impaired UE functional use;Increased muscle spasms;Decreased mobility;Decreased strength;Impaired perceived functional ability   Rehab Potential Good   PT Frequency 1x / week   PT Duration 6 weeks   PT Treatment/Interventions ADLs/Self Care Home Management;Neuromuscular re-education;Biofeedback;Functional mobility training;Patient/family education;Cryotherapy;Therapeutic activities;Therapeutic exercise;Manual techniques;Energy conservation;Moist Heat;Balance training;Traction   PT Next Visit Plan Continue to focus only on cervical spine, postural, scapular musculature. Progress exercises and activities as appropriate. Advance HEP as appropriate.    PT Home Exercise Plan cervical rotation/flexion/extension/lateral flexion/retraction/protraction, thoracic spine excursions    Consulted and Agree with Plan of Care Patient        Problem List Patient Active Problem List   Diagnosis Date Noted  . Cervical stenosis of spinal canal 08/06/2014  . Muscle weakness (generalized) 11/12/2013  . Tight fascia 11/12/2013  . Decreased range of motion of shoulder 11/12/2013  . Cervical spondylosis without myelopathy 10/29/2013  . S/P carpal tunnel release 02/19/2013  . CTS (carpal tunnel syndrome) 02/19/2013  . SHOULDER PAIN 10/06/2007  . IMPINGEMENT SYNDROME 10/06/2007  . RUPTURE ROTATOR CUFF 10/06/2007  . HIGH BLOOD PRESSURE 10/03/2007    Deniece Ree PT, DPT [facility] [address] [place], [place], [address] Phone: [phone]   Fax:  [phone]

## 2015-01-03 ENCOUNTER — Ambulatory Visit (HOSPITAL_COMMUNITY)
Admission: RE | Admit: 2015-01-03 | Discharge: 2015-01-03 | Disposition: A | Discharge status: Home / Self Care | Payer: 59 | Source: Ambulatory Visit | Attending: Neurosurgery | Admitting: Neurosurgery

## 2015-01-03 ENCOUNTER — Other Ambulatory Visit: Payer: Self-pay | Admitting: *Deleted

## 2015-01-03 DIAGNOSIS — R1314 Dysphagia, pharyngoesophageal phase: Secondary | ICD-10-CM

## 2015-01-03 DIAGNOSIS — R1310 Dysphagia, unspecified: Secondary | ICD-10-CM | POA: Diagnosis present

## 2015-01-03 MED ORDER — METHOCARBAMOL 500 MG PO TABS: 500.0000 mg | ORAL_TABLET | Freq: Two times a day (BID) | ORAL | Status: DC

## 2015-01-04 ENCOUNTER — Other Ambulatory Visit: Payer: Self-pay | Admitting: *Deleted

## 2015-01-04 ENCOUNTER — Ambulatory Visit (HOSPITAL_COMMUNITY): Payer: 59 | Attending: Neurosurgery | Admitting: Physical Therapy

## 2015-01-04 DIAGNOSIS — M5382 Other specified dorsopathies, cervical region: Secondary | ICD-10-CM | POA: Diagnosis not present

## 2015-01-04 DIAGNOSIS — M25611 Stiffness of right shoulder, not elsewhere classified: Secondary | ICD-10-CM | POA: Diagnosis not present

## 2015-01-04 DIAGNOSIS — R29898 Other symptoms and signs involving the musculoskeletal system: Secondary | ICD-10-CM

## 2015-01-04 DIAGNOSIS — M25612 Stiffness of left shoulder, not elsewhere classified: Secondary | ICD-10-CM | POA: Diagnosis not present

## 2015-01-04 DIAGNOSIS — M542 Cervicalgia: Secondary | ICD-10-CM | POA: Diagnosis not present

## 2015-01-04 DIAGNOSIS — M6281 Muscle weakness (generalized): Secondary | ICD-10-CM

## 2015-01-04 MED ORDER — GABAPENTIN 100 MG PO CAPS: ORAL_CAPSULE | ORAL | Status: DC

## 2015-01-04 NOTE — Therapy (Signed)
[facility] [address] [place], [place], [address] Phone: [phone]   Fax:  [phone]  Physical Therapy Treatment  Patient Details  Name: Kimberly [name] MRN: [id number] Date of Birth: 09/28/1960 Referring Provider:  Botero, Ernesto, MD  Encounter Date: 01/04/2015      PT End of Session - 01/04/15 1205    Visit Number 11   Number of Visits 22   Date for PT Re-Evaluation 01/05/15   Authorization Type UHC    Authorization Time Period 11/10/14 to 01/10/15   Authorization - Visit Number 10   Authorization - Number of Visits 23   PT Start Time 1110   PT Stop Time 1140   PT Time Calculation (min) 30 min   Behavior During Therapy [facility] for tasks assessed/performed      Past Medical History  Diagnosis Date  . COPD (chronic obstructive pulmonary disease)   . Herpes   . Hypertension     pt. doesn't see cardiologist, followed for HTN by Dr. Fusco  . Shortness of breath dyspnea   . Anxiety     takes alprazolam - rare use   . GERD (gastroesophageal reflux disease)     pt. reports that its better, no meds in use at this time- 2015  . Arthritis     cervical spondylosis     Past Surgical History  Procedure Laterality Date  . Abdominal hysterectomy    . Rotator cuff repair Left   . Foreign body removal Left     knee-as child  . Carpal tunnel release Left 02/16/2013    Procedure: CARPAL TUNNEL RELEASE;  Surgeon: Stanley E Harrison, MD;  Location: [facility];  Service: Orthopedics;  Laterality: Left;  . Carpal tunnel release Right 03/27/2013    Procedure: RIGHT CARPAL TUNNEL RELEASE;  Surgeon: Stanley E Harrison, MD;  Location: [facility];  Service: Orthopedics;  Laterality: Right;  . Cesarean section      x2  . Anterior cervical decomp/discectomy fusion N/A 08/06/2014    Procedure: ANTERIOR CERVICAL DECOMPRESSION/DISCECTOMY FUSION CERVICAL THREE-FOUR,CERVICAL SIX-SEVEN ,CERVICAL SEVEN-THORACIC ONE;  Surgeon: Ernesto M Botero, MD;  Location:  [facility];  Service: Neurosurgery;  Laterality: N/A;    There were no vitals filed for this visit.  Visit Diagnosis:  Cervical pain  Neck muscle weakness  Decreased range of motion of neck  Muscle weakness (generalized)      Subjective Assessment - 01/04/15 1156    Subjective Reports that she went in and had a swallow evaluation done yesterday at [facility]. She did have a problem with swallowing the pill. She describes that she was told that it may be due to the hardware in her neck. She is on a modified diet now until she has a followup with her doctor in july. Otherwise her shoulder is still sore and waiting to hear back about the second opinion.                          [facility] Adult PT Treatment/Exercise - 01/04/15 0001    Neck Exercises: Seated   Shoulder Shrugs 15 reps   Shoulder Rolls Backwards;15 reps   Other Seated Exercise Scapular retractions 1x20; diaphragmatic breathing 1x30   Neck Exercises: Supine   Cervical Isometrics Flexion;Extension;Right lateral flexion;Left lateral flexion;5 secs;10 reps   Neck Retraction 10 reps   Neck Retraction Limitations 2 sets of 5, tactile cues for deep cervical flexor activation.    Neck Exercises: Stretches     Upper Trapezius Stretch 3 reps;10 seconds  bilaterally   Levator Stretch 3 reps;10 seconds  to right side only.                PT Education - 01/04/15 1203    Education provided Yes   Education Details discussed performing exercises as tolerated, avoiding pain or increased symptoms.    Person(s) Educated Patient   Methods Explanation   Comprehension Verbalized understanding          PT Short Term Goals - 12/24/14 1115    PT SHORT TERM GOAL #1   Title Patient will improve her cervical range of motion by at least 10 degrees on all planes to assist her in better performing functional tasks    Baseline 5/27- has met in some planes (see assessment section) but not all    Time 4   Period Weeks   Status  On-going   PT SHORT TERM GOAL #2   Title Patient will complain of no more than 4/10 pain in her neck during all functional tasks and activities involved in caring for her mother    Baseline 5/27- 4/10 at worst    Period Days   Status Achieved   PT SHORT TERM GOAL #3   Title Patient will demonstrate improved bilateral shoulder  and shoulder girdle strength to at least 4/5 in order to improve regional stability and reduce pain    Baseline 5/27- did not assess    Time 4   Period Weeks   Status On-going   PT SHORT TERM GOAL #4   Title Patient will be able to verbally state the importance of maintaining good posture in reducing her overall pain and will also be able to maintain good posture and cervical mechanics 90% of the time    Time 4   Period Weeks   Status Achieved           PT Long Term Goals - 12/24/14 1154    PT LONG TERM GOAL #1   Title Patient will be independent in correctly and consistently performing appropriate HEP in order to assist in the management of her condition   Time 8   Period Weeks   Status Achieved   PT LONG TERM GOAL #2   Title Patient will demonstrate at least 4/5 cervical strength on all planes of motion and will demonstrate at least 4+/5 in bilateral shoulder/shoulder girdle musculature    Time 8   Period Weeks   Status On-going   PT LONG TERM GOAL #3   Title Patient will demonstrate the ability to easily perform cervical rotation either direction at least 70 degrees with pain no more than 1/10 in order to allow her to safely drive and perform functional tasks    Time 8   Period Weeks   Status On-going   PT LONG TERM GOAL #4   Title Patient will be able to lie on her back with cervical pain at 0/10 in order to allow her to get a good night's sleep and reduce environmental stress; patient will also state that she is having 0/10 pain throughout functional activities during her day    Time 8   Period Weeks   Status Revised               Plan  - 01/04/15 1207    Clinical Impression Statement Patient with reports of ongoing pain in cervical and Lt should region. She reports difficulty with dressing due to the shoulder pain. No  time set for second opinion on Lt shoulder pain. Working on cervical stabilization and scapular mobilization exercises in clinic.    Pt will benefit from skilled therapeutic intervention in order to improve on the following deficits Decreased coordination;Decreased range of motion;Impaired flexibility;Improper body mechanics;Postural dysfunction;Decreased activity tolerance;Decreased balance;Pain;Impaired UE functional use;Increased muscle spasms;Decreased mobility;Decreased strength;Impaired perceived functional ability   Rehab Potential Good   PT Frequency 1x / week   PT Next Visit Plan Continue to focus only on cervical spine, postural, scapular musculature. Progress exercises and activities as appropriate. Advance HEP as appropriate.    PT Home Exercise Plan continue with stabilization exercises in clinci and home as tolerated.    Consulted and Agree with Plan of Care Patient        Problem List Patient Active Problem List   Diagnosis Date Noted  . Cervical stenosis of spinal canal 08/06/2014  . Muscle weakness (generalized) 11/12/2013  . Tight fascia 11/12/2013  . Decreased range of motion of shoulder 11/12/2013  . Cervical spondylosis without myelopathy 10/29/2013  . S/P carpal tunnel release 02/19/2013  . CTS (carpal tunnel syndrome) 02/19/2013  . SHOULDER PAIN 10/06/2007  . IMPINGEMENT SYNDROME 10/06/2007  . RUPTURE ROTATOR CUFF 10/06/2007  . HIGH BLOOD PRESSURE 10/03/2007    Benjamin J. Zaino, PT, CSCS  01/04/2015, 12:11 PM  [facility] [address] [place], [place], [address] Phone: [phone]   Fax:  [phone]      

## 2015-01-10 NOTE — Addendum Note (Signed)
Addended by: Deniece Ree E on: 01/10/2015 10:00 AM   Modules accepted: Orders

## 2015-01-13 ENCOUNTER — Telehealth (HOSPITAL_COMMUNITY): Payer: Self-pay | Admitting: Speech Pathology

## 2015-01-13 ENCOUNTER — Encounter (HOSPITAL_COMMUNITY): Payer: Self-pay | Admitting: Speech Pathology

## 2015-01-18 ENCOUNTER — Ambulatory Visit (HOSPITAL_COMMUNITY): Payer: 59 | Admitting: Physical Therapy

## 2015-01-18 DIAGNOSIS — R29898 Other symptoms and signs involving the musculoskeletal system: Secondary | ICD-10-CM

## 2015-01-18 DIAGNOSIS — M25512 Pain in left shoulder: Secondary | ICD-10-CM

## 2015-01-18 DIAGNOSIS — M542 Cervicalgia: Secondary | ICD-10-CM | POA: Diagnosis not present

## 2015-01-18 DIAGNOSIS — M25612 Stiffness of left shoulder, not elsewhere classified: Secondary | ICD-10-CM

## 2015-01-18 DIAGNOSIS — M6281 Muscle weakness (generalized): Secondary | ICD-10-CM

## 2015-01-18 DIAGNOSIS — M5382 Other specified dorsopathies, cervical region: Secondary | ICD-10-CM

## 2015-01-18 NOTE — Therapy (Signed)
[place], [place], [address] Phone: [phone]   Fax:  [phone]  Physical Therapy Treatment (Re-Assessment/Shoulder Order Received)  Patient Details  Name: Kimberly [name] MRN: [id number] Date of Birth: 1961-06-15 Referring Provider:  Redmond School, MD  Encounter Date: 01/18/2015      PT End of Session - 01/18/15 1154    Visit Number 12   Number of Visits 22   Date for PT Re-Evaluation 02/15/15   Authorization Type U[employer]  (23 visit limit for PT)   Authorization Time Period 11/10/14 to 01/10/15   Authorization - Visit Number 12   Authorization - Number of Visits 23   PT Start Time 1103   PT Stop Time 0973   PT Time Calculation (min) 42 min   Activity Tolerance Patient tolerated treatment well   Behavior During Therapy [facility] for tasks assessed/performed      Past Medical History  Diagnosis Date  . COPD (chronic obstructive pulmonary disease)   . Herpes   . Hypertension     pt. doesn't see cardiologist, followed for HTN by Dr. Gerarda Fraction  . Shortness of breath dyspnea   . Anxiety     takes alprazolam - rare use   . GERD (gastroesophageal reflux disease)     pt. reports that its better, no meds in use at this time- 2015  . Arthritis     cervical spondylosis     Past Surgical History  Procedure Laterality Date  . Abdominal hysterectomy    . Rotator cuff repair Left   . Foreign body removal Left     knee-as child  . Carpal tunnel release Left 02/16/2013    Procedure: CARPAL TUNNEL RELEASE;  Surgeon: Carole Civil, MD;  Location: [facility];  Service: Orthopedics;  Laterality: Left;  . Carpal tunnel release Right 03/27/2013    Procedure: RIGHT CARPAL TUNNEL RELEASE;  Surgeon: Carole Civil, MD;  Location: [facility];  Service: Orthopedics;  Laterality: Right;  . Cesarean section      x2  . Anterior cervical decomp/discectomy fusion N/A 08/06/2014    Procedure: ANTERIOR CERVICAL DECOMPRESSION/DISCECTOMY  FUSION CERVICAL THREE-FOUR,CERVICAL SIX-SEVEN ,CERVICAL SEVEN-THORACIC ONE;  Surgeon: Floyce Stakes, MD;  Location: [place];  Service: Neurosurgery;  Laterality: N/A;    There were no vitals filed for this visit.  Visit Diagnosis:  Cervical pain - Plan: PT plan of care cert/re-cert  Neck muscle weakness - Plan: PT plan of care cert/re-cert  Decreased range of motion of neck - Plan: PT plan of care cert/re-cert  Muscle weakness (generalized) - Plan: PT plan of care cert/re-cert  Shoulder weakness - Plan: PT plan of care cert/re-cert  Decreased range of motion of shoulder, left - Plan: PT plan of care cert/re-cert  Left shoulder pain - Plan: PT plan of care cert/re-cert      Subjective Assessment - 01/18/15 1108    Subjective Patient arrived with order in hand to treat L partial supraspinatus tear and L shoulder impingement from Dr. 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    Pertinent History Problems with neck and back started about a year and a half ago; was laid off January 2015. Before she was laid off, she was doing the work of 3 positions with a lot of head turning, stress on her neck. Went to multiple MDs about  pain.    How long can you sit comfortably? 6/21- 30 to 45 minutes    How long can you stand comfortably? More limited by breathing due to COPD   How long can you walk comfortably? Mostly limited by COPD; neck and back are not as much a problem   Patient Stated Goals get motion back in neck, especially rotation, without pain   Currently in Pain? Yes   Pain Score 3   neck and L shoulder             [facility] PT Assessment - 01/18/15 0001    Assessment   Medical Diagnosis spondylosis without myelopathy or radiculopathy; on 6/21 got order for L partial supraspinatus tear, impingement    Onset Date/Surgical Date 08/11/13   Next MD Visit [place] in July;  following up with Layne Benton this summer    Balance Screen   Has the patient fallen in the past 6 months Yes   How many times? 1   Has the patient had a decrease in activity level because of a fear of falling?  Yes   Is the patient reluctant to leave their home because of a fear of falling?  No   Prior Function   Level of Independence Independent;Independent with gait;Independent with transfers   Vocation Other (comment)  applying for disability    Leisure reading   Posture/Postural Control   Posture Comments forward head, excess lordosis cervical and lumbar spine, flat thoracic spine, IR bilateral shoulders, muscle tension noted bilateral upper traps    AROM   Right Shoulder Flexion 160 Degrees   Right Shoulder ABduction 175 Degrees   Right Shoulder Internal Rotation 78 Degrees   Right Shoulder External Rotation 58 Degrees   Left Shoulder Flexion 125 Degrees   Left Shoulder ABduction 90 Degrees   Left Shoulder Internal Rotation 90 Degrees   Left Shoulder External Rotation 46 Degrees   Cervical Flexion 71   Cervical Extension 32   Cervical - Right Side Bend 34   Cervical - Left Side Bend 40   Cervical - Right Rotation 52   Cervical - Left Rotation 58   Strength   Right Shoulder Flexion 4/5   Right Shoulder ABduction 4-/5   Right Shoulder Internal Rotation 4/5   Right Shoulder External Rotation 4-/5   Left Shoulder Flexion 3-/5  pain limited    Left Shoulder ABduction 2+/5  pain limited    Left Shoulder Internal Rotation 3+/5   Left Shoulder External Rotation 3-/5   Cervical Flexion 4-/5   Cervical Extension 3+/5   Cervical - Right Side Bend 4-/5   Cervical - Left Side Bend 4-/5   Cervical - Right Rotation 3+/5   Cervical - Left Rotation 3/5                             PT Education - 01/18/15 1154    Education provided Yes   Education Details plan of care moving forward, updated HEP, education regarding neck anatomy and possible reasoning behind MD's  referral to ENT    Person(s) Educated Patient   Methods Explanation   Comprehension Verbalized understanding          PT Short Term Goals - 01/18/15 1132    PT SHORT TERM GOAL #1   Title Patient will improve her cervical range of motion by at least 10 degrees on all planes to assist her in better performing functional tasks  Baseline 5/27- has met in some planes (see assessment section) but not all    Time 4   Period Weeks   Status On-going   PT SHORT TERM GOAL #2   Title Patient will complain of no more than 4/10 pain in her neck during all functional tasks and activities involved in caring for her mother    Baseline 5/27- 4/10 at worst    Time 4   Period Days   Status Achieved   PT SHORT TERM GOAL #3   Title Patient will demonstrate improved bilateral shoulder  and bilateral shoulder girdle strength to at least 4/5 in order to improve regional stability and reduce pain    Time 4   Period Weeks   Status On-going   PT SHORT TERM GOAL #4   Title Patient will be able to verbally state the importance of maintaining good posture in reducing her overall pain and will also be able to maintain good posture and cervical mechanics 90% of the time    Time 4   Period Weeks   Status Achieved   PT SHORT TERM GOAL #5   Title Patient will improve L shoulder ROM by at least 15 degrees all planes with no increase in pain    Time 4   Period Weeks   Status New   Additional Short Term Goals   Additional Short Term Goals Yes           PT Long Term Goals - 01/18/15 1134    PT LONG TERM GOAL #1   Title Patient will be independent in correctly and consistently performing appropriate HEP for both neck and shoulder  in order to assist in the management of her condition   Time 8   Period Weeks   Status Revised   PT LONG TERM GOAL #2   Title Patient will demonstrate at least 4/5 cervical strength on all planes of motion and will demonstrate at least 4+/5 in bilateral shoulder/shoulder  girdle musculature    Time 8   Period Weeks   Status On-going   PT LONG TERM GOAL #3   Title Patient will demonstrate the ability to easily perform cervical rotation either direction at least 70 degrees with pain no more than 1/10 in order to allow her to safely drive and perform functional tasks    Time 8   Period Weeks   Status On-going   PT LONG TERM GOAL #4   Title Patient will be able to lie on her back with cervical pain at 0/10 in order to allow her to get a good night's sleep and reduce environmental stress; patient will also state that she is having 0/10 pain throughout functional activities during her day    Baseline 6/21- patient states that she is sleeping much better without pain at night, still can't lie on L side though; still having pain during the day    Time 8   Period Weeks   Status Partially Met   PT LONG TERM GOAL #5   Title Patient will be able to independently wash her hair with both arms with pain no more than 1/10 and no residual soreness after task    Time 8   Period Weeks   Status New   Additional Long Term Goals   Additional Long Term Goals Yes   PT LONG TERM GOAL #6   Title Patient will be able to independently put on shirts and jackets with no pain and no ROM restrictions and  no residual soreness after task    Time 8   Period Weeks   Status New               Plan - 01/18/15 1155    Clinical Impression Statement Re-assessment performed today. Patient arrived with order in hand for PT from MD for L shoulder for L partial supraspinatus tear and impingement today. Patient continues to demonstrate reduced strength in cervical region and L shoulder, significant pain L shoulder and moderate pain in neck, postual impairments, reduced functional task performance skills due to pain neck and L shoulder, and  reduced functional use of L shoulder due to pain. Patient was given updated HEP today  for shoulder and was instructed to continue with neck exercise HEP.  Patient will benefit from continued skilled PT services in order to address her remaining cervical limitations and to attempt to reduce functional limitations present in L shoulder at this time.    Pt will benefit from skilled therapeutic intervention in order to improve on the following deficits Decreased coordination;Decreased range of motion;Impaired flexibility;Improper body mechanics;Postural dysfunction;Decreased activity tolerance;Decreased balance;Pain;Impaired UE functional use;Increased muscle spasms;Decreased mobility;Decreased strength;Impaired perceived functional ability   Rehab Potential Good   PT Frequency 1x / week  PT to follow up with patient to increase frequency back to 2x/week after receiving order for shoulder    PT Duration 6 weeks   PT Treatment/Interventions ADLs/Self Care Home Management;Neuromuscular re-education;Biofeedback;Functional mobility training;Patient/family education;Cryotherapy;Therapeutic activities;Therapeutic exercise;Manual techniques;Energy conservation;Moist Heat;Balance training;Traction   PT Next Visit Plan Follow up with patient to increase back to 2x/week. Have received order and now able to focus on L shoulder; focus on scapular stabilization and shoulder ROM as tolerated, progress exercises/activities as appropriate    PT Home Exercise Plan continue with stabilization exercises in clinci and home as tolerated.    Consulted and Agree with Plan of Care Patient        Problem List Patient Active Problem List   Diagnosis Date Noted  . Cervical stenosis of spinal canal 08/06/2014  . Muscle weakness (generalized) 11/12/2013  . Tight fascia 11/12/2013  . Decreased range of motion of shoulder 11/12/2013  . Cervical spondylosis without myelopathy 10/29/2013  . S/P carpal tunnel release 02/19/2013  . CTS (carpal tunnel syndrome) 02/19/2013  . SHOULDER PAIN 10/06/2007  . IMPINGEMENT SYNDROME 10/06/2007  . RUPTURE ROTATOR CUFF 10/06/2007  . HIGH  BLOOD PRESSURE 10/03/2007    Deniece Ree PT, DPT [place] [address] [place], [place], [address] Phone: [phone]   Fax:  [phone]

## 2015-01-18 NOTE — Patient Instructions (Signed)
   PENDULUM SHOULDER CIRCLES  Shift your body weight in circles to allow your injured arm to swing in circles freely. Your injured arm should be fully relaxed. Do at least 2 minutes.     PENDULUM SHOULDER LATERAL  Shift your body weight side to side to allow your injured arm to swing side to side freely. Your injured arm should be fully relaxed. Repeat for at least 2 minutes.     PENDULUM SHOULDER FORWARD/BACK  Shift your body weight forward then back to allow your injured arm to swing forward and back freely. Your injured arm should be fully relaxed. Repeated for at least 2 minutes.       SCAPULAR RETRACTIONS  Draw your shoulder blades back and down. Repeat 10-20 times, twice a day.

## 2015-01-19 NOTE — Telephone Encounter (Signed)
Patient canceled her 01-25-15 appt ,she need to take her son to the airport.

## 2015-01-25 ENCOUNTER — Encounter (HOSPITAL_COMMUNITY): Payer: 59 | Admitting: Physical Therapy

## 2015-01-28 ENCOUNTER — Ambulatory Visit (HOSPITAL_COMMUNITY): Payer: 59 | Attending: Neurosurgery | Admitting: Physical Therapy

## 2015-01-28 DIAGNOSIS — M7581 Other shoulder lesions, right shoulder: Secondary | ICD-10-CM | POA: Diagnosis present

## 2015-01-28 DIAGNOSIS — M629 Disorder of muscle, unspecified: Secondary | ICD-10-CM | POA: Insufficient documentation

## 2015-01-28 DIAGNOSIS — M7582 Other shoulder lesions, left shoulder: Secondary | ICD-10-CM | POA: Diagnosis present

## 2015-01-28 DIAGNOSIS — M5382 Other specified dorsopathies, cervical region: Secondary | ICD-10-CM | POA: Insufficient documentation

## 2015-01-28 DIAGNOSIS — M25612 Stiffness of left shoulder, not elsewhere classified: Secondary | ICD-10-CM

## 2015-01-28 DIAGNOSIS — M6281 Muscle weakness (generalized): Secondary | ICD-10-CM

## 2015-01-28 DIAGNOSIS — M25512 Pain in left shoulder: Secondary | ICD-10-CM | POA: Diagnosis present

## 2015-01-28 DIAGNOSIS — M542 Cervicalgia: Secondary | ICD-10-CM | POA: Insufficient documentation

## 2015-01-28 DIAGNOSIS — R29898 Other symptoms and signs involving the musculoskeletal system: Secondary | ICD-10-CM | POA: Diagnosis present

## 2015-01-28 NOTE — Patient Instructions (Addendum)
Strengthening: Resisted Extension   Hold tubing in right hand, arm forward. Pull arm back, elbow straight. Repeat _15_ times per set. Do _1-2___ sets per session. Do __1-2__ sessions per day.  http://orth.exer.us/833   Copyright  VHI. All rights reserved.  Row: Mid-Range - Standing   With yellow band anchored at chest level, pull elbows backward, squeezing shoulder blades together. Keep head and spine neutral. Row _15__ times, __1-2_ times per day.  http://ss.exer.us/291   Copyright  VHI. All rights reserved.  Strengthening: Resisted Internal Rotation   Hold tubing in left hand, elbow at side and forearm out. Rotate forearm in across body. Repeat __15__ times per set. Do _1___ sets per session. Do _1-2___ sessions per day.  http://orth.exer.us/831   Copyright  VHI. All rights reserved.

## 2015-01-28 NOTE — Therapy (Signed)
[place], [place], [address] Phone: [phone]   Fax:  [phone]  Physical Therapy Treatment  Patient Details  Name: Kimberly [name] MRN: [id number] Date of Birth: 01/16/1961 Referring Provider:  Leeroy Cha, MD  Encounter Date: 01/28/2015      PT End of Session - 01/28/15 0852    Visit Number 13   Number of Visits 22   Date for PT Re-Evaluation 02/15/15   Authorization Type UHC  (23 visit limit for PT)   Authorization Time Period 11/10/14 to 01/10/15   Authorization - Visit Number 75   Authorization - Number of Visits 23   PT Start Time 0800   PT Stop Time 0843   PT Time Calculation (min) 43 min   Activity Tolerance Patient tolerated treatment well   Behavior During Therapy [facility] for tasks assessed/performed      Past Medical History  Diagnosis Date  . COPD (chronic obstructive pulmonary disease)   . Herpes   . Hypertension     pt. doesn't see cardiologist, followed for HTN by Dr. Gerarda Fraction  . Shortness of breath dyspnea   . Anxiety     takes alprazolam - rare use   . GERD (gastroesophageal reflux disease)     pt. reports that its better, no meds in use at this time- 2015  . Arthritis     cervical spondylosis     Past Surgical History  Procedure Laterality Date  . Abdominal hysterectomy    . Rotator cuff repair Left   . Foreign body removal Left     knee-as child  . Carpal tunnel release Left 02/16/2013    Procedure: CARPAL TUNNEL RELEASE;  Surgeon: Carole Civil, MD;  Location: [facility];  Service: Orthopedics;  Laterality: Left;  . Carpal tunnel release Right 03/27/2013    Procedure: RIGHT CARPAL TUNNEL RELEASE;  Surgeon: Carole Civil, MD;  Location: [facility];  Service: Orthopedics;  Laterality: Right;  . Cesarean section      x2  . Anterior cervical decomp/discectomy fusion N/A 08/06/2014    Procedure: ANTERIOR CERVICAL DECOMPRESSION/DISCECTOMY FUSION CERVICAL THREE-FOUR,CERVICAL  SIX-SEVEN ,CERVICAL SEVEN-THORACIC ONE;  Surgeon: Floyce Stakes, MD;  Location: [place];  Service: Neurosurgery;  Laterality: N/A;    There were no vitals filed for this visit.  Visit Diagnosis:  Muscle weakness (generalized)  Shoulder weakness  Decreased range of motion of shoulder, left  Left shoulder pain      Subjective Assessment - 01/28/15 0803    Subjective Pt reports that she is going to see her orthopedic today at 2:30. She rates that pain in her L shoulder today 5/10.   Currently in Pain? Yes   Pain Score 5    Pain Location Shoulder   Pain Orientation Left   Pain Score 2   Pain Location Neck            [facility] Adult PT Treatment/Exercise - 01/28/15 0001    Exercises   Exercises Shoulder   Neck Exercises: Supine   Neck Retraction 10 reps   Neck Retraction Limitations 5 second holds   Shoulder Exercises: Supine   Protraction 10 reps   Protraction Weight (lbs) 0   Shoulder Exercises: Sidelying   External Rotation --   External Rotation Limitations --   Shoulder Exercises: Standing   External Rotation 10 reps   External Rotation Limitations isometric 3 second hold at wall   Internal Rotation 15 reps   Theraband Level (  Shoulder Internal Rotation) Level 2 (Red)   Extension 15 reps   Theraband Level (Shoulder Extension) Level 3 (Green)   Row 15 reps   Theraband Level (Shoulder Row) Level 3 (Green)   Other Standing Exercises scapular elevation/depression x 10 with tactile cueing   Shoulder Exercises: ROM/Strengthening   Rhythmic Stabilization, Supine x 30 seconds   Other ROM/Strengthening Exercises wand AAROM shoulder flexion x 10   Manual Therapy   Manual Therapy Soft tissue mobilization   Soft tissue mobilization L cervical musculature, upper trap, supraspinatus                PT Education - 01/28/15 0388    Education provided Yes   Education Details Updated HEP, education given regarding posture.    Person(s) Educated Patient   Methods  Explanation;Handout   Comprehension Verbalized understanding;Returned demonstration          PT Short Term Goals - 01/18/15 1132    PT SHORT TERM GOAL #1   Title Patient will improve her cervical range of motion by at least 10 degrees on all planes to assist her in better performing functional tasks    Baseline 5/27- has met in some planes (see assessment section) but not all    Time 4   Period Weeks   Status On-going   PT SHORT TERM GOAL #2   Title Patient will complain of no more than 4/10 pain in her neck during all functional tasks and activities involved in caring for her mother    Baseline 5/27- 4/10 at worst    Time 4   Period Days   Status Achieved   PT SHORT TERM GOAL #3   Title Patient will demonstrate improved bilateral shoulder  and bilateral shoulder girdle strength to at least 4/5 in order to improve regional stability and reduce pain    Time 4   Period Weeks   Status On-going   PT SHORT TERM GOAL #4   Title Patient will be able to verbally state the importance of maintaining good posture in reducing her overall pain and will also be able to maintain good posture and cervical mechanics 90% of the time    Time 4   Period Weeks   Status Achieved   PT SHORT TERM GOAL #5   Title Patient will improve L shoulder ROM by at least 15 degrees all planes with no increase in pain    Time 4   Period Weeks   Status New   Additional Short Term Goals   Additional Short Term Goals Yes           PT Long Term Goals - 01/18/15 1134    PT LONG TERM GOAL #1   Title Patient will be independent in correctly and consistently performing appropriate HEP for both neck and shoulder  in order to assist in the management of her condition   Time 8   Period Weeks   Status Revised   PT LONG TERM GOAL #2   Title Patient will demonstrate at least 4/5 cervical strength on all planes of motion and will demonstrate at least 4+/5 in bilateral shoulder/shoulder girdle musculature    Time 8    Period Weeks   Status On-going   PT LONG TERM GOAL #3   Title Patient will demonstrate the ability to easily perform cervical rotation either direction at least 70 degrees with pain no more than 1/10 in order to allow her to safely drive and perform functional tasks  Time 8   Period Weeks   Status On-going   PT LONG TERM GOAL #4   Title Patient will be able to lie on her back with cervical pain at 0/10 in order to allow her to get a good night's sleep and reduce environmental stress; patient will also state that she is having 0/10 pain throughout functional activities during her day    Baseline 6/21- patient states that she is sleeping much better without pain at night, still can't lie on L side though; still having pain during the day    Time 8   Period Weeks   Status Partially Met   PT LONG TERM GOAL #5   Title Patient will be able to independently wash her hair with both arms with pain no more than 1/10 and no residual soreness after task    Time 8   Period Weeks   Status New   Additional Long Term Goals   Additional Long Term Goals Yes   PT LONG TERM GOAL #6   Title Patient will be able to independently put on shirts and jackets with no pain and no ROM restrictions and no residual soreness after task    Time 8   Period Weeks   Status New               Plan - 01/28/15 1039    Clinical Impression Statement Treatment today focused on scapular strengthening and AAROM to improve shoulder function and decrease pain. Pt was educated on AAROM in pain free range to improve mobility of shoulder, and responded well to scapular strengthening exercises. She was given updated HEP to improve scapular stability.    PT Next Visit Plan Continue with scapular stabilization exercises and AAROM for L shoulder.         Problem List Patient Active Problem List   Diagnosis Date Noted  . Cervical stenosis of spinal canal 08/06/2014  . Muscle weakness (generalized) 11/12/2013  . Tight  fascia 11/12/2013  . Decreased range of motion of shoulder 11/12/2013  . Cervical spondylosis without myelopathy 10/29/2013  . S/P carpal tunnel release 02/19/2013  . CTS (carpal tunnel syndrome) 02/19/2013  . SHOULDER PAIN 10/06/2007  . IMPINGEMENT SYNDROME 10/06/2007  . RUPTURE ROTATOR CUFF 10/06/2007  . HIGH BLOOD PRESSURE 10/03/2007    Hilma Favors, PT, DPT [phone] 01/28/2015, 12:00 PM  [place] [address] [place], [place], [address] Phone: [phone]   Fax:  [phone]

## 2015-02-01 ENCOUNTER — Ambulatory Visit (HOSPITAL_COMMUNITY): Payer: 59 | Admitting: Physical Therapy

## 2015-02-01 DIAGNOSIS — M6289 Other specified disorders of muscle: Secondary | ICD-10-CM

## 2015-02-01 DIAGNOSIS — M25512 Pain in left shoulder: Secondary | ICD-10-CM

## 2015-02-01 DIAGNOSIS — M542 Cervicalgia: Secondary | ICD-10-CM

## 2015-02-01 DIAGNOSIS — M6281 Muscle weakness (generalized): Secondary | ICD-10-CM | POA: Diagnosis not present

## 2015-02-01 DIAGNOSIS — M629 Disorder of muscle, unspecified: Secondary | ICD-10-CM

## 2015-02-01 DIAGNOSIS — R29898 Other symptoms and signs involving the musculoskeletal system: Secondary | ICD-10-CM

## 2015-02-01 DIAGNOSIS — M25611 Stiffness of right shoulder, not elsewhere classified: Secondary | ICD-10-CM

## 2015-02-01 DIAGNOSIS — M25612 Stiffness of left shoulder, not elsewhere classified: Secondary | ICD-10-CM

## 2015-02-01 DIAGNOSIS — M5382 Other specified dorsopathies, cervical region: Secondary | ICD-10-CM

## 2015-02-01 NOTE — Therapy (Signed)
[place], [place], [address] Phone: [phone]   Fax:  [phone]  Physical Therapy Treatment  Patient Details  Name: Kimberly [name] MRN: [id number] Date of Birth: 10-May-1961 Referring Provider:  Leeroy Cha, MD  Encounter Date: 02/01/2015      PT End of Session - 02/01/15 1047    Visit Number 14   Number of Visits 22   Date for PT Re-Evaluation 02/15/15   Authorization Type U[employer]  (23 visit limit for PT)   Authorization Time Period 01/18/15 to 03/20/15   Authorization - Visit Number 14   Authorization - Number of Visits 23   PT Start Time 1018   PT Stop Time 1100   PT Time Calculation (min) 42 min   Activity Tolerance Patient tolerated treatment well   Behavior During Therapy [facility] for tasks assessed/performed      Past Medical History  Diagnosis Date  . COPD (chronic obstructive pulmonary disease)   . Herpes   . Hypertension     pt. doesn't see cardiologist, followed for HTN by Dr. Gerarda Fraction  . Shortness of breath dyspnea   . Anxiety     takes alprazolam - rare use   . GERD (gastroesophageal reflux disease)     pt. reports that its better, no meds in use at this time- 2015  . Arthritis     cervical spondylosis     Past Surgical History  Procedure Laterality Date  . Abdominal hysterectomy    . Rotator cuff repair Left   . Foreign body removal Left     knee-as child  . Carpal tunnel release Left 02/16/2013    Procedure: CARPAL TUNNEL RELEASE;  Surgeon: Carole Civil, MD;  Location: [facility];  Service: Orthopedics;  Laterality: Left;  . Carpal tunnel release Right 03/27/2013    Procedure: RIGHT CARPAL TUNNEL RELEASE;  Surgeon: Carole Civil, MD;  Location: [facility];  Service: Orthopedics;  Laterality: Right;  . Cesarean section      x2  . Anterior cervical decomp/discectomy fusion N/A 08/06/2014    Procedure: ANTERIOR CERVICAL DECOMPRESSION/DISCECTOMY FUSION CERVICAL THREE-FOUR,CERVICAL  SIX-SEVEN ,CERVICAL SEVEN-THORACIC ONE;  Surgeon: Floyce Stakes, MD;  Location: [facility];  Service: Neurosurgery;  Laterality: N/A;    There were no vitals filed for this visit.  Visit Diagnosis:  Decreased range of motion of shoulder, right  Shoulder weakness  Decreased range of motion of shoulder, left  Left shoulder pain  Cervical pain  Neck muscle weakness  Decreased range of motion of neck  Tight fascia      Subjective Assessment - 02/01/15 1025    Subjective PT statess she went to orthopedist in [place] for second opinion on Lt shoulder.  States he stated shoulder pain may be related to cervical; patient is not happy or satisfied with this and feels it is not related.  Recommended continuing therapy.    Currently in Pain? Yes   Pain Score 2    Pain Location Shoulder   Pain Orientation Left                         [facility] Adult PT Treatment/Exercise - 02/01/15 1025    Neck Exercises: Machines for Strengthening   UBE (Upper Arm Bike) 4 minutes backward level 1   Neck Exercises: Seated   Neck Retraction 15 reps   W Back 15 reps   Shoulder Shrugs 15 reps   Shoulder Exercises:  Standing   External Rotation 15 reps   Theraband Level (Shoulder External Rotation) Level 3 (Green)   Internal Rotation 15 reps   Theraband Level (Shoulder Internal Rotation) Level 3 (Green)   Extension 15 reps   Theraband Level (Shoulder Extension) Level 3 (Green)   Row 15 reps   Theraband Level (Shoulder Row) Level 3 (Green)   Other Standing Exercises wall push ups 10 reps   Shoulder Exercises: ROM/Strengthening   Other ROM/Strengthening Exercises wand AAROM shoulder flexion x 10   Manual Therapy   Manual Therapy Soft tissue mobilization   Soft tissue mobilization L cervical musculature, upper trap, supraspinatus   Neck Exercises: Stretches   Corner Stretch 5 reps;20 seconds                  PT Short Term Goals - 01/18/15 1132    PT SHORT TERM GOAL #1   Title  Patient will improve her cervical range of motion by at least 10 degrees on all planes to assist her in better performing functional tasks    Baseline 5/27- has met in some planes (see assessment section) but not all    Time 4   Period Weeks   Status On-going   PT SHORT TERM GOAL #2   Title Patient will complain of no more than 4/10 pain in her neck during all functional tasks and activities involved in caring for her mother    Baseline 5/27- 4/10 at worst    Time 4   Period Days   Status Achieved   PT SHORT TERM GOAL #3   Title Patient will demonstrate improved bilateral shoulder  and bilateral shoulder girdle strength to at least 4/5 in order to improve regional stability and reduce pain    Time 4   Period Weeks   Status On-going   PT SHORT TERM GOAL #4   Title Patient will be able to verbally state the importance of maintaining good posture in reducing her overall pain and will also be able to maintain good posture and cervical mechanics 90% of the time    Time 4   Period Weeks   Status Achieved   PT SHORT TERM GOAL #5   Title Patient will improve L shoulder ROM by at least 15 degrees all planes with no increase in pain    Time 4   Period Weeks   Status New   Additional Short Term Goals   Additional Short Term Goals Yes           PT Long Term Goals - 01/18/15 1134    PT LONG TERM GOAL #1   Title Patient will be independent in correctly and consistently performing appropriate HEP for both neck and shoulder  in order to assist in the management of her condition   Time 8   Period Weeks   Status Revised   PT LONG TERM GOAL #2   Title Patient will demonstrate at least 4/5 cervical strength on all planes of motion and will demonstrate at least 4+/5 in bilateral shoulder/shoulder girdle musculature    Time 8   Period Weeks   Status On-going   PT LONG TERM GOAL #3   Title Patient will demonstrate the ability to easily perform cervical rotation either direction at least 70  degrees with pain no more than 1/10 in order to allow her to safely drive and perform functional tasks    Time 8   Period Weeks   Status On-going   PT LONG  TERM GOAL #4   Title Patient will be able to lie on her back with cervical pain at 0/10 in order to allow her to get a good night's sleep and reduce environmental stress; patient will also state that she is having 0/10 pain throughout functional activities during her day    Baseline 6/21- patient states that she is sleeping much better without pain at night, still can't lie on L side though; still having pain during the day    Time 8   Period Weeks   Status Partially Met   PT LONG TERM GOAL #5   Title Patient will be able to independently wash her hair with both arms with pain no more than 1/10 and no residual soreness after task    Time 8   Period Weeks   Status New   Additional Long Term Goals   Additional Long Term Goals Yes   PT LONG TERM GOAL #6   Title Patient will be able to independently put on shirts and jackets with no pain and no ROM restrictions and no residual soreness after task    Time 8   Period Weeks   Status New               Plan - 02/01/15 1049    Clinical Impression Statement Continued focus on scapular strengthening and decreasing spasms/tightness.  Added corner chest stretch and wall push ups to POC.  Progressed to green theraband for ER/IR.  Large spasm resolved in LT Upper trap with soft tissue techniaques.  PT wtihout pain at end of session.    PT Next Visit Plan Continue with scapular stabilization exercises and AAROM for L shoulder.         Problem List Patient Active Problem List   Diagnosis Date Noted  . Cervical stenosis of spinal canal 08/06/2014  . Muscle weakness (generalized) 11/12/2013  . Tight fascia 11/12/2013  . Decreased range of motion of shoulder 11/12/2013  . Cervical spondylosis without myelopathy 10/29/2013  . S/P carpal tunnel release 02/19/2013  . CTS (carpal tunnel  syndrome) 02/19/2013  . SHOULDER PAIN 10/06/2007  . IMPINGEMENT SYNDROME 10/06/2007  . RUPTURE ROTATOR CUFF 10/06/2007  . HIGH BLOOD PRESSURE 10/03/2007    Teena Irani, PTA/CLT [phone]  02/01/2015, 11:05 AM  [place], [place], [address] Phone: [phone]   Fax:  [phone]

## 2015-02-08 ENCOUNTER — Ambulatory Visit (HOSPITAL_COMMUNITY): Payer: 59 | Admitting: Physical Therapy

## 2015-02-08 DIAGNOSIS — M629 Disorder of muscle, unspecified: Secondary | ICD-10-CM

## 2015-02-08 DIAGNOSIS — R29898 Other symptoms and signs involving the musculoskeletal system: Secondary | ICD-10-CM

## 2015-02-08 DIAGNOSIS — M25512 Pain in left shoulder: Secondary | ICD-10-CM

## 2015-02-08 DIAGNOSIS — M6289 Other specified disorders of muscle: Secondary | ICD-10-CM

## 2015-02-08 DIAGNOSIS — M5382 Other specified dorsopathies, cervical region: Secondary | ICD-10-CM

## 2015-02-08 DIAGNOSIS — M6281 Muscle weakness (generalized): Secondary | ICD-10-CM | POA: Diagnosis not present

## 2015-02-08 DIAGNOSIS — M542 Cervicalgia: Secondary | ICD-10-CM

## 2015-02-08 DIAGNOSIS — M25612 Stiffness of left shoulder, not elsewhere classified: Secondary | ICD-10-CM

## 2015-02-08 NOTE — Therapy (Signed)
[place], [place], [address] Phone: [phone]   Fax:  [phone]  Physical Therapy Treatment  Patient Details  Name: Kimberly [name] MRN: [id number] Date of Birth: [birth year] Referring Provider:  Leeroy Cha, MD  Encounter Date: 02/08/2015      PT End of Session - 02/08/15 1101    Visit Number 15   Number of Visits 22   Date for PT Re-Evaluation 02/15/15   Authorization Type UHC  (23 visit limit for PT)   Authorization Time Period 01/18/15 to 03/20/15   Authorization - Visit Number 15   Authorization - Number of Visits 23   PT Start Time 1016   PT Stop Time 1056   PT Time Calculation (min) 40 min   Activity Tolerance Patient tolerated treatment well   Behavior During Therapy [facility] for tasks assessed/performed      Past Medical History  Diagnosis Date  . COPD (chronic obstructive pulmonary disease)   . Herpes   . Hypertension     pt. doesn't see cardiologist, followed for HTN by Dr. Gerarda Fraction  . Shortness of breath dyspnea   . Anxiety     takes alprazolam - rare use   . GERD (gastroesophageal reflux disease)     pt. reports that its better, no meds in use at this time- 2015  . Arthritis     cervical spondylosis     Past Surgical History  Procedure Laterality Date  . Abdominal hysterectomy    . Rotator cuff repair Left   . Foreign body removal Left     knee-as child  . Carpal tunnel release Left 02/16/2013    Procedure: CARPAL TUNNEL RELEASE;  Surgeon: Carole Civil, MD;  Location: [facility];  Service: Orthopedics;  Laterality: Left;  . Carpal tunnel release Right 03/27/2013    Procedure: RIGHT CARPAL TUNNEL RELEASE;  Surgeon: Carole Civil, MD;  Location: [facility];  Service: Orthopedics;  Laterality: Right;  . Cesarean section      x2  . Anterior cervical decomp/discectomy fusion N/A 08/06/2014    Procedure: ANTERIOR CERVICAL DECOMPRESSION/DISCECTOMY FUSION CERVICAL THREE-FOUR,CERVICAL  SIX-SEVEN ,CERVICAL SEVEN-THORACIC ONE;  Surgeon: Floyce Stakes, MD;  Location: [place];  Service: Neurosurgery;  Laterality: N/A;    There were no vitals filed for this visit.  Visit Diagnosis:  Shoulder weakness  Decreased range of motion of shoulder, left  Left shoulder pain  Cervical pain  Neck muscle weakness  Decreased range of motion of neck  Tight fascia      Subjective Assessment - 02/08/15 1017    Subjective Patient reports that she is seeing Dr. Marvetta Gibbons tomorrow to get referral for ENT doctor   Pertinent History Problems with neck and back started about a year and a half ago; was laid off January 2015. Before she was laid off, she was doing the work of 3 positions with a lot of head turning, stress on her neck. Went to multiple MDs about pain.    Currently in Pain? Yes   Pain Score 2    Pain Location Shoulder   Pain Orientation Left                         [facility] Adult PT Treatment/Exercise - 02/08/15 0001    Neck Exercises: Seated   Neck Retraction 20 reps   Other Seated Exercise Cervical and thoracic 3D excursions 1x10   Neck Exercises: Supine   Other  Supine Exercise Diaphragmatic breathing 1x30   Shoulder Exercises: Supine   Protraction 15 reps   Protraction Weight (lbs) 1   Other Supine Exercises Isometric stabilization against random directions 2 rounds    Shoulder Exercises: Seated   Other Seated Exercises Seated flexion, ABD, ER with wand AAROM 1x10; AAROM pulleys 1x10   Other Seated Exercises Posterior shoulder rolls 1x20   Shoulder Exercises: Standing   External Rotation 15 reps   External Rotation Limitations 1 pound weight    Retraction 15 reps   Theraband Level (Shoulder Retraction) Level 2 (Red)   Other Standing Exercises Shoulder pendulums all directions    Other Standing Exercises Wall pushups 1x4, cues for form  terminated due to pain L shoulder    Neck Exercises: Stretches   Upper Trapezius Stretch 2 reps;30 seconds    Corner Stretch 3 reps;30 seconds                PT Education - 02/08/15 1100    Education provided Yes   Education Details education regarding close relationship of shoulder and cervical spine    Person(s) Educated Patient   Methods Explanation   Comprehension Verbalized understanding          PT Short Term Goals - 01/18/15 1132    PT SHORT TERM GOAL #1   Title Patient will improve her cervical range of motion by at least 10 degrees on all planes to assist her in better performing functional tasks    Baseline 5/27- has met in some planes (see assessment section) but not all    Time 4   Period Weeks   Status On-going   PT SHORT TERM GOAL #2   Title Patient will complain of no more than 4/10 pain in her neck during all functional tasks and activities involved in caring for her mother    Baseline 5/27- 4/10 at worst    Time 4   Period Days   Status Achieved   PT SHORT TERM GOAL #3   Title Patient will demonstrate improved bilateral shoulder  and bilateral shoulder girdle strength to at least 4/5 in order to improve regional stability and reduce pain    Time 4   Period Weeks   Status On-going   PT SHORT TERM GOAL #4   Title Patient will be able to verbally state the importance of maintaining good posture in reducing her overall pain and will also be able to maintain good posture and cervical mechanics 90% of the time    Time 4   Period Weeks   Status Achieved   PT SHORT TERM GOAL #5   Title Patient will improve L shoulder ROM by at least 15 degrees all planes with no increase in pain    Time 4   Period Weeks   Status New   Additional Short Term Goals   Additional Short Term Goals Yes           PT Long Term Goals - 01/18/15 1134    PT LONG TERM GOAL #1   Title Patient will be independent in correctly and consistently performing appropriate HEP for both neck and shoulder  in order to assist in the management of her condition   Time 8   Period Weeks   Status  Revised   PT LONG TERM GOAL #2   Title Patient will demonstrate at least 4/5 cervical strength on all planes of motion and will demonstrate at least 4+/5 in bilateral shoulder/shoulder girdle musculature  Time 8   Period Weeks   Status On-going   PT LONG TERM GOAL #3   Title Patient will demonstrate the ability to easily perform cervical rotation either direction at least 70 degrees with pain no more than 1/10 in order to allow her to safely drive and perform functional tasks    Time 8   Period Weeks   Status On-going   PT LONG TERM GOAL #4   Title Patient will be able to lie on her back with cervical pain at 0/10 in order to allow her to get a good night's sleep and reduce environmental stress; patient will also state that she is having 0/10 pain throughout functional activities during her day    Baseline 6/21- patient states that she is sleeping much better without pain at night, still can't lie on L side though; still having pain during the day    Time 8   Period Weeks   Status Partially Met   PT LONG TERM GOAL #5   Title Patient will be able to independently wash her hair with both arms with pain no more than 1/10 and no residual soreness after task    Time 8   Period Weeks   Status New   Additional Long Term Goals   Additional Long Term Goals Yes   PT LONG TERM GOAL #6   Title Patient will be able to independently put on shirts and jackets with no pain and no ROM restrictions and no residual soreness after task    Time 8   Period Weeks   Status New               Plan - 02/08/15 1102    Clinical Impression Statement Continued focus on scapular and shoulder stabilization as well as cervical mobility and shoulder AAROM. Performed exercises with lighter weights today with focus on form today. Introduced supine isometric stabilizations, pulleys, and shoulder pendulums today. Patient ended session with reduced pain overall but did state that her neck is still a little sore.      Pt will benefit from skilled therapeutic intervention in order to improve on the following deficits Decreased coordination;Decreased range of motion;Impaired flexibility;Improper body mechanics;Postural dysfunction;Decreased activity tolerance;Decreased balance;Pain;Impaired UE functional use;Increased muscle spasms;Decreased mobility;Decreased strength;Impaired perceived functional ability   Rehab Potential Good   PT Treatment/Interventions ADLs/Self Care Home Management;Neuromuscular re-education;Biofeedback;Functional mobility training;Patient/family education;Cryotherapy;Therapeutic activities;Therapeutic exercise;Manual techniques;Energy conservation;Moist Heat;Balance training;Traction   PT Next Visit Plan Continue with scapular stabilization exercises and AAROM for L shoulder. Also continue with cervical mobility.    PT Home Exercise Plan continue with stabilization exercises in clinci and home as tolerated.    Consulted and Agree with Plan of Care Patient        Problem List Patient Active Problem List   Diagnosis Date Noted  . Cervical stenosis of spinal canal 08/06/2014  . Muscle weakness (generalized) 11/12/2013  . Tight fascia 11/12/2013  . Decreased range of motion of shoulder 11/12/2013  . Cervical spondylosis without myelopathy 10/29/2013  . S/P carpal tunnel release 02/19/2013  . CTS (carpal tunnel syndrome) 02/19/2013  . SHOULDER PAIN 10/06/2007  . IMPINGEMENT SYNDROME 10/06/2007  . RUPTURE ROTATOR CUFF 10/06/2007  . HIGH BLOOD PRESSURE 10/03/2007    Deniece Ree PT, DPT [facility] [address] [place], [place], [address] Phone: [phone]   Fax:  [phone]

## 2015-02-11 ENCOUNTER — Ambulatory Visit (HOSPITAL_COMMUNITY): Payer: 59 | Admitting: Physical Therapy

## 2015-02-11 DIAGNOSIS — M25512 Pain in left shoulder: Secondary | ICD-10-CM

## 2015-02-11 DIAGNOSIS — M6281 Muscle weakness (generalized): Secondary | ICD-10-CM | POA: Diagnosis not present

## 2015-02-11 DIAGNOSIS — R29898 Other symptoms and signs involving the musculoskeletal system: Secondary | ICD-10-CM

## 2015-02-11 DIAGNOSIS — M25612 Stiffness of left shoulder, not elsewhere classified: Secondary | ICD-10-CM

## 2015-02-11 NOTE — Therapy (Signed)
[place], [place], [address] Phone: [phone]   Fax:  [phone]  Physical Therapy Treatment  Patient Details  Name: Kimberly [name] MRN: [id number] Date of Birth: [birth year] Referring Provider:  Leeroy Cha, MD  Encounter Date: 02/11/2015      PT End of Session - 02/11/15 0841    Visit Number 16   Number of Visits 22   Date for PT Re-Evaluation 02/15/15   Authorization Type [employer]  (23 visit limit for PT)   Authorization Time Period 01/18/15 to 03/20/15   Authorization - Visit Number 38   Authorization - Number of Visits 23   PT Start Time 0800   PT Stop Time 0843   PT Time Calculation (min) 43 min   Activity Tolerance Patient tolerated treatment well   Behavior During Therapy [facility] for tasks assessed/performed      Past Medical History  Diagnosis Date  . COPD (chronic obstructive pulmonary disease)   . Herpes   . Hypertension     pt. doesn't see cardiologist, followed for HTN by Dr. Gerarda Fraction  . Shortness of breath dyspnea   . Anxiety     takes alprazolam - rare use   . GERD (gastroesophageal reflux disease)     pt. reports that its better, no meds in use at this time- 2015  . Arthritis     cervical spondylosis     Past Surgical History  Procedure Laterality Date  . Abdominal hysterectomy    . Rotator cuff repair Left   . Foreign body removal Left     knee-as child  . Carpal tunnel release Left 02/16/2013    Procedure: CARPAL TUNNEL RELEASE;  Surgeon: Carole Civil, MD;  Location: [facility];  Service: Orthopedics;  Laterality: Left;  . Carpal tunnel release Right 03/27/2013    Procedure: RIGHT CARPAL TUNNEL RELEASE;  Surgeon: Carole Civil, MD;  Location: [facility];  Service: Orthopedics;  Laterality: Right;  . Cesarean section      x2  . Anterior cervical decomp/discectomy fusion N/A 08/06/2014    Procedure: ANTERIOR CERVICAL DECOMPRESSION/DISCECTOMY FUSION CERVICAL THREE-FOUR,CERVICAL  SIX-SEVEN ,CERVICAL SEVEN-THORACIC ONE;  Surgeon: Floyce Stakes, MD;  Location: [place];  Service: Neurosurgery;  Laterality: N/A;    There were no vitals filed for this visit.  Visit Diagnosis:  Shoulder weakness  Decreased range of motion of shoulder, left  Left shoulder pain      Subjective Assessment - 02/11/15 0804    Subjective Pt reports that she thinks that she slept on her L arm, she woke up with pain this morning. She reports that her pain is a 4/10 today.    Currently in Pain? Yes   Pain Score 4    Pain Location Shoulder   Pain Orientation Left             [facility] Adult PT Treatment/Exercise - 02/11/15 0001    Shoulder Exercises: Supine   Protraction 15 reps   Protraction Weight (lbs) 1   Shoulder Exercises: Sidelying   External Rotation 10 reps   Shoulder Exercises: Standing   External Rotation 10 reps   External Rotation Limitations isometric 5 second hold at wall   Internal Rotation 15 reps   Theraband Level (Shoulder Internal Rotation) Level 3 (Green)   Flexion 10 reps   Shoulder Flexion Weight (lbs) 1   Extension 15 reps   Theraband Level (Shoulder Extension) Level 3 (Green)   Row 15 reps  Theraband Level (Shoulder Row) Level 3 (Green)   Retraction 15 reps   Theraband Level (Shoulder Retraction) Level 3 (Green)   Shoulder Exercises: Pulleys   Flexion 2 minutes   ABduction 2 minutes   Shoulder Exercises: ROM/Strengthening   UBE (Upper Arm Bike) 3 minutes forward   Ball on Wall x10 up/down, side/side, clockwise/counterclockwse   Manual Therapy   Manual Therapy Soft tissue mobilization   Soft tissue mobilization L cervical musculature, upper trap, supraspinatus                PT Education - 02/11/15 0840    Education provided Yes   Education Details Education regarding posture, avoiding activities involving abduction/ER at this time   Person(s) Educated Patient   Methods Explanation   Comprehension Verbalized understanding           PT Short Term Goals - 01/18/15 1132    PT SHORT TERM GOAL #1   Title Patient will improve her cervical range of motion by at least 10 degrees on all planes to assist her in better performing functional tasks    Baseline 5/27- has met in some planes (see assessment section) but not all    Time 4   Period Weeks   Status On-going   PT SHORT TERM GOAL #2   Title Patient will complain of no more than 4/10 pain in her neck during all functional tasks and activities involved in caring for her mother    Baseline 5/27- 4/10 at worst    Time 4   Period Days   Status Achieved   PT SHORT TERM GOAL #3   Title Patient will demonstrate improved bilateral shoulder  and bilateral shoulder girdle strength to at least 4/5 in order to improve regional stability and reduce pain    Time 4   Period Weeks   Status On-going   PT SHORT TERM GOAL #4   Title Patient will be able to verbally state the importance of maintaining good posture in reducing her overall pain and will also be able to maintain good posture and cervical mechanics 90% of the time    Time 4   Period Weeks   Status Achieved   PT SHORT TERM GOAL #5   Title Patient will improve L shoulder ROM by at least 15 degrees all planes with no increase in pain    Time 4   Period Weeks   Status New   Additional Short Term Goals   Additional Short Term Goals Yes           PT Long Term Goals - 01/18/15 1134    PT LONG TERM GOAL #1   Title Patient will be independent in correctly and consistently performing appropriate HEP for both neck and shoulder  in order to assist in the management of her condition   Time 8   Period Weeks   Status Revised   PT LONG TERM GOAL #2   Title Patient will demonstrate at least 4/5 cervical strength on all planes of motion and will demonstrate at least 4+/5 in bilateral shoulder/shoulder girdle musculature    Time 8   Period Weeks   Status On-going   PT LONG TERM GOAL #3   Title Patient will demonstrate the  ability to easily perform cervical rotation either direction at least 70 degrees with pain no more than 1/10 in order to allow her to safely drive and perform functional tasks    Time 8   Period Weeks  Status On-going   PT LONG TERM GOAL #4   Title Patient will be able to lie on her back with cervical pain at 0/10 in order to allow her to get a good night's sleep and reduce environmental stress; patient will also state that she is having 0/10 pain throughout functional activities during her day    Baseline 6/21- patient states that she is sleeping much better without pain at night, still can't lie on L side though; still having pain during the day    Time 8   Period Weeks   Status Partially Met   PT LONG TERM GOAL #5   Title Patient will be able to independently wash her hair with both arms with pain no more than 1/10 and no residual soreness after task    Time 8   Period Weeks   Status New   Additional Long Term Goals   Additional Long Term Goals Yes   PT LONG TERM GOAL #6   Title Patient will be able to independently put on shirts and jackets with no pain and no ROM restrictions and no residual soreness after task    Time 8   Period Weeks   Status New               Plan - 02/11/15 0841    Clinical Impression Statement Treament focused on improving L scapular control and shoulder strength. Pt completed isometric and sidelying ER with no c/o pain, but was unable to complete shoulder abduction due to increased pain in shoulder. She responded well to all therex today, and reported decreased pain following manual therapy and therex.    PT Next Visit Plan Continue to progress ER as tolerated, adding 1 pound weight in sidelying or yellow tband in standing if painfree. Continue with scapular stabilization        Problem List Patient Active Problem List   Diagnosis Date Noted  . Cervical stenosis of spinal canal 08/06/2014  . Muscle weakness (generalized) 11/12/2013  . Tight  fascia 11/12/2013  . Decreased range of motion of shoulder 11/12/2013  . Cervical spondylosis without myelopathy 10/29/2013  . S/P carpal tunnel release 02/19/2013  . CTS (carpal tunnel syndrome) 02/19/2013  . SHOULDER PAIN 10/06/2007  . IMPINGEMENT SYNDROME 10/06/2007  . RUPTURE ROTATOR CUFF 10/06/2007  . HIGH BLOOD PRESSURE 10/03/2007    Hilma Favors, PT, DPT [phone] 02/11/2015, 8:46 AM  [place], [place], [address] Phone: [phone]   Fax:  [phone]

## 2015-02-15 ENCOUNTER — Telehealth (HOSPITAL_COMMUNITY): Payer: Self-pay | Admitting: Physical Therapy

## 2015-02-15 ENCOUNTER — Ambulatory Visit (HOSPITAL_COMMUNITY): Payer: 59 | Admitting: Physical Therapy

## 2015-02-15 NOTE — Telephone Encounter (Signed)
Patient a no show for her appointment today. Called and left a message detailing the time and date of next appointment.  Deniece Ree PT, DPT [phone]

## 2015-02-18 ENCOUNTER — Ambulatory Visit (HOSPITAL_COMMUNITY): Payer: 59 | Admitting: Physical Therapy

## 2015-02-18 DIAGNOSIS — M25612 Stiffness of left shoulder, not elsewhere classified: Secondary | ICD-10-CM

## 2015-02-18 DIAGNOSIS — M5382 Other specified dorsopathies, cervical region: Secondary | ICD-10-CM

## 2015-02-18 DIAGNOSIS — M542 Cervicalgia: Secondary | ICD-10-CM

## 2015-02-18 DIAGNOSIS — M25512 Pain in left shoulder: Secondary | ICD-10-CM

## 2015-02-18 DIAGNOSIS — M6281 Muscle weakness (generalized): Secondary | ICD-10-CM | POA: Diagnosis not present

## 2015-02-18 DIAGNOSIS — R29898 Other symptoms and signs involving the musculoskeletal system: Secondary | ICD-10-CM

## 2015-02-18 NOTE — Therapy (Signed)
[place] [address] [place], [place], [address] Phone: [phone]   Fax:  [phone]  Physical Therapy Treatment (Re-Assessment)  Patient Details  Name: Kimberly [name] MRN: [id number] Date of Birth: 10-11-1960 Referring Provider:  Leeroy Cha, MD  Encounter Date: 02/18/2015      PT End of Session - 02/18/15 1034    Visit Number 17   Number of Visits 23   Date for PT Re-Evaluation 03/18/15   Authorization Type [employer]  (23 visit limit for PT)   Authorization Time Period 01/18/15 to 03/20/15   Authorization - Visit Number 58   Authorization - Number of Visits 23   PT Start Time 0804   PT Stop Time 0845   PT Time Calculation (min) 41 min   Activity Tolerance Patient tolerated treatment well;Patient limited by pain   Behavior During Therapy [facility] for tasks assessed/performed      Past Medical History  Diagnosis Date  . COPD (chronic obstructive pulmonary disease)   . Herpes   . Hypertension     pt. doesn't see cardiologist, followed for HTN by Dr. Gerarda Fraction  . Shortness of breath dyspnea   . Anxiety     takes alprazolam - rare use   . GERD (gastroesophageal reflux disease)     pt. reports that its better, no meds in use at this time- 2015  . Arthritis     cervical spondylosis     Past Surgical History  Procedure Laterality Date  . Abdominal hysterectomy    . Rotator cuff repair Left   . Foreign body removal Left     knee-as child  . Carpal tunnel release Left 02/16/2013    Procedure: CARPAL TUNNEL RELEASE;  Surgeon: Carole Civil, MD;  Location: [facility];  Service: Orthopedics;  Laterality: Left;  . Carpal tunnel release Right 03/27/2013    Procedure: RIGHT CARPAL TUNNEL RELEASE;  Surgeon: Carole Civil, MD;  Location: [facility];  Service: Orthopedics;  Laterality: Right;  . Cesarean section      x2  . Anterior cervical decomp/discectomy fusion N/A 08/06/2014    Procedure: ANTERIOR CERVICAL DECOMPRESSION/DISCECTOMY  FUSION CERVICAL THREE-FOUR,CERVICAL SIX-SEVEN ,CERVICAL SEVEN-THORACIC ONE;  Surgeon: Floyce Stakes, MD;  Location: [place];  Service: Neurosurgery;  Laterality: N/A;    There were no vitals filed for this visit.  Visit Diagnosis:  Shoulder weakness  Decreased range of motion of shoulder, left  Left shoulder pain  Cervical pain  Neck muscle weakness  Decreased range of motion of neck      Subjective Assessment - 02/18/15 0805    Subjective Patient reports that her shoulder is starting to feel a bit better, but that ER/IR exercises/stretches and corner stretch do tend to aggravate her shoulder. Pain is about a 4/10 in her shoulder, 2/10 in her neck.    Pertinent History Problems with neck and back started about a year and a half ago; was laid off January 2015. Before she was laid off, she was doing the work of 3 positions with a lot of head turning, stress on her neck. Went to multiple MDs about pain.    How long can you sit comfortably? 7/20- around 60-90 minutes    How long can you stand comfortably? More limited by breathing due to COPD   How long can you walk comfortably? Mostly limited by COPD; neck and back are not as much a problem   Patient Stated Goals get motion back in neck, especially  rotation, without pain   Currently in Pain? Yes   Pain Score 4   4/10 in L shoulder, 2/10 in neck    Pain Orientation Left            [facility] PT Assessment - 02/18/15 0001    AROM   Right Shoulder Flexion 160 Degrees   Right Shoulder ABduction 173 Degrees   Right Shoulder Internal Rotation 90 Degrees   Right Shoulder External Rotation 56 Degrees   Left Shoulder Flexion 126 Degrees   Left Shoulder ABduction 90 Degrees   Left Shoulder Internal Rotation 90 Degrees   Left Shoulder External Rotation 50 Degrees  pain limited    Cervical Flexion 66   Cervical Extension 35   Cervical - Right Side Bend 34   Cervical - Left Side Bend 32   Cervical - Right Rotation 56   Cervical - Left  Rotation 60   Strength   Right Shoulder Flexion 4+/5   Right Shoulder ABduction 4/5   Right Shoulder Internal Rotation 4+/5   Right Shoulder External Rotation 4-/5   Left Shoulder Flexion 3/5  pain    Left Shoulder ABduction 2+/5  pain limited    Left Shoulder Internal Rotation 4-/5  pain    Left Shoulder External Rotation 3-/5  pain    Cervical Flexion 4-/5   Cervical Extension 4/5   Cervical - Right Side Bend 4/5   Cervical - Left Side Bend 4-/5   Cervical - Right Rotation 4-/5   Cervical - Left Rotation 3+/5                     [facility] Adult PT Treatment/Exercise - 02/18/15 0001    Shoulder Exercises: Supine   Protraction 15 reps   Protraction Weight (lbs) 1   Flexion AAROM;Left;15 reps   Flexion Limitations dowel    ABduction Left;10 reps   ABduction Limitations dowel    Other Supine Exercises Isometric stabilization against random directions 1 rounds    Shoulder Exercises: Standing   Other Standing Exercises Shoulder pendulums all directions    Other Standing Exercises Attempted wall pushups but terminated exercise due to pain                 PT Education - 02/18/15 1034    Education provided Yes   Education Details plan of care moving forward   Person(s) Educated Patient   Methods Explanation   Comprehension Verbalized understanding          PT Short Term Goals - 02/18/15 0826    PT SHORT TERM GOAL #1   Title Patient will improve her cervical range of motion by at least 10 degrees on all planes to assist her in better performing functional tasks    Baseline 7/22- has met in some planes but not all    Time 4   Period Weeks   Status On-going   PT SHORT TERM GOAL #2   Title Patient will complain of no more than 4/10 pain in her neck during all functional tasks and activities involved in caring for her mother    Baseline 7/22- does not have her mom anymore (at [employer]), but pain is still around 4-5/10 during functional tasks    Time 4    Period Days   Status On-going   PT SHORT TERM GOAL #3   Title Patient will demonstrate improved bilateral shoulder  and bilateral shoulder girdle strength to at least 4/5 in order to improve regional stability  and reduce pain    Baseline 7/20 improving but still not at goal    Time 4   Period Weeks   Status On-going   PT SHORT TERM GOAL #4   Title Patient will be able to verbally state the importance of maintaining good posture in reducing her overall pain and will also be able to maintain good posture and cervical mechanics 90% of the time    Time 4   Period Weeks   Status Achieved   PT SHORT TERM GOAL #5   Title Patient will improve L shoulder ROM by at least 15 degrees all planes with no increase in pain    Baseline 7/22- still being limited by pain    Time 4   Period Weeks   Status On-going           PT Long Term Goals - 02/18/15 3151    PT LONG TERM GOAL #1   Title Patient will be independent in correctly and consistently performing appropriate HEP for both neck and shoulder  in order to assist in the management of her condition   Time 8   Period Weeks   Status Achieved   PT LONG TERM GOAL #2   Title Patient will demonstrate at least 4/5 cervical strength on all planes of motion and will demonstrate at least 4+/5 in bilateral shoulder/shoulder girdle musculature    Time 8   Period Weeks   Status On-going   PT LONG TERM GOAL #3   Title Patient will demonstrate the ability to easily perform cervical rotation either direction at least 70 degrees with pain no more than 1/10 in order to allow her to safely drive and perform functional tasks    Baseline 7/22- pain has decreased but has not met ROM component    Time 8   Period Weeks   Status Partially Met   PT LONG TERM GOAL #4   Title Patient will be able to lie on her back with cervical pain at 0/10 in order to allow her to get a good night's sleep and reduce environmental stress; patient will also state that she is having  0/10 pain throughout functional activities during her day    Baseline 7/22- sleeping on back is OK, still can't lie on L side , still having pain throughout day    Time 8   Period Weeks   Status Partially Met   PT LONG TERM GOAL #5   Title Patient will be able to independently wash her hair with both arms with pain no more than 1/10 and no residual soreness after task    Time 8   Period Weeks   Status On-going   PT LONG TERM GOAL #6   Title Patient will be able to independently put on shirts and jackets with no pain and no ROM restrictions and no residual soreness after task    Time 8   Period Weeks   Status On-going               Plan - 02/18/15 1035    Clinical Impression Statement Re-assessment performed today. Patient continues to demonstrate cervical and L shoulder stiffness today, with reduced ROM in neck which patient attributes to appiontment being early in the morning and reports that she always has increased cervical stiffness in the morning which improves through the day. Patient continues to be limited by pain in her L shoulder more so than the remaining pain in her neck, and continues to have  difficulty in functional tasks such as washing her hair and putting on jackets due to pain. While improving, she does continue to demonstrate cervical and shoudler weakness bilaterally.  Patient adamently states that she would like to continue with PT as long as she can within insurance visit limitations in order to continue to address her deficits and try to avoid another surgery. Patient will benefit from another 6 sessions to attempt to address her remaining deficits and assist her in reaching an optimal level of function.    Pt will benefit from skilled therapeutic intervention in order to improve on the following deficits Decreased coordination;Decreased range of motion;Impaired flexibility;Improper body mechanics;Postural dysfunction;Decreased activity tolerance;Decreased  balance;Pain;Impaired UE functional use;Increased muscle spasms;Decreased mobility;Decreased strength;Impaired perceived functional ability   Rehab Potential Good   PT Frequency Other (comment)  6 more sessions    PT Duration Other (comment)  6 more sessions    PT Treatment/Interventions ADLs/Self Care Home Management;Neuromuscular re-education;Biofeedback;Functional mobility training;Patient/family education;Cryotherapy;Therapeutic activities;Therapeutic exercise;Manual techniques;Energy conservation;Moist Heat;Balance training;Traction   PT Next Visit Plan Continue to progress ER as tolerated, adding 1 pound weight in sidelying or yellow tband in standing if painfree. Continue with scapular stabilization   Consulted and Agree with Plan of Care Patient        Problem List Patient Active Problem List   Diagnosis Date Noted  . Cervical stenosis of spinal canal 08/06/2014  . Muscle weakness (generalized) 11/12/2013  . Tight fascia 11/12/2013  . Decreased range of motion of shoulder 11/12/2013  . Cervical spondylosis without myelopathy 10/29/2013  . S/P carpal tunnel release 02/19/2013  . CTS (carpal tunnel syndrome) 02/19/2013  . SHOULDER PAIN 10/06/2007  . IMPINGEMENT SYNDROME 10/06/2007  . RUPTURE ROTATOR CUFF 10/06/2007  . HIGH BLOOD PRESSURE 10/03/2007   Physical Therapy Progress Note  Dates of Reporting Period: 01/18/15 to 02/18/15  Objective Reports of Subjective Statement: see above   Objective Measurements: see above   Goal Update: see above   Plan: see above   Reason Skilled Services are Required: cervical and shoulder stiffness and weakness, L shoulder and cervical pain, reduced functional task performance skills    Deniece Ree PT, DPT [place], [place], [address] Phone: [phone]   Fax:  [phone]

## 2015-02-22 ENCOUNTER — Ambulatory Visit (HOSPITAL_COMMUNITY): Payer: 59 | Admitting: Physical Therapy

## 2015-02-22 DIAGNOSIS — M5382 Other specified dorsopathies, cervical region: Secondary | ICD-10-CM

## 2015-02-22 DIAGNOSIS — M25612 Stiffness of left shoulder, not elsewhere classified: Secondary | ICD-10-CM

## 2015-02-22 DIAGNOSIS — M25512 Pain in left shoulder: Secondary | ICD-10-CM

## 2015-02-22 DIAGNOSIS — R29898 Other symptoms and signs involving the musculoskeletal system: Secondary | ICD-10-CM

## 2015-02-22 DIAGNOSIS — M542 Cervicalgia: Secondary | ICD-10-CM

## 2015-02-22 DIAGNOSIS — M6281 Muscle weakness (generalized): Secondary | ICD-10-CM | POA: Diagnosis not present

## 2015-02-22 NOTE — Therapy (Signed)
[place], [place], [address] Phone: [phone]   Fax:  [phone]  Physical Therapy Treatment  Patient Details  Name: Kimberly [name] MRN: [id number] Date of Birth: [birth year] Referring Provider:  Leeroy Cha, MD  Encounter Date: 02/22/2015      PT End of Session - 02/22/15 1152    Visit Number 18   Number of Visits 23   Date for PT Re-Evaluation 03/18/15   Authorization Type [employer]  (23 visit limit for PT)   Authorization Time Period 01/18/15 to 03/20/15   Authorization - Visit Number 31   Authorization - Number of Visits 23   PT Start Time 1100   PT Stop Time 1140   PT Time Calculation (min) 40 min   Activity Tolerance Patient tolerated treatment well;Patient limited by pain   Behavior During Therapy [facility] for tasks assessed/performed      Past Medical History  Diagnosis Date  . COPD (chronic obstructive pulmonary disease)   . Herpes   . Hypertension     pt. doesn't see cardiologist, followed for HTN by Dr. Gerarda Fraction  . Shortness of breath dyspnea   . Anxiety     takes alprazolam - rare use   . GERD (gastroesophageal reflux disease)     pt. reports that its better, no meds in use at this time- 2015  . Arthritis     cervical spondylosis     Past Surgical History  Procedure Laterality Date  . Abdominal hysterectomy    . Rotator cuff repair Left   . Foreign body removal Left     knee-as child  . Carpal tunnel release Left 02/16/2013    Procedure: CARPAL TUNNEL RELEASE;  Surgeon: Carole Civil, MD;  Location: [facility];  Service: Orthopedics;  Laterality: Left;  . Carpal tunnel release Right 03/27/2013    Procedure: RIGHT CARPAL TUNNEL RELEASE;  Surgeon: Carole Civil, MD;  Location: [facility];  Service: Orthopedics;  Laterality: Right;  . Cesarean section      x2  . Anterior cervical decomp/discectomy fusion N/A 08/06/2014    Procedure: ANTERIOR CERVICAL DECOMPRESSION/DISCECTOMY FUSION CERVICAL  THREE-FOUR,CERVICAL SIX-SEVEN ,CERVICAL SEVEN-THORACIC ONE;  Surgeon: Floyce Stakes, MD;  Location: [facility];  Service: Neurosurgery;  Laterality: N/A;    There were no vitals filed for this visit.  Visit Diagnosis:  Shoulder weakness  Decreased range of motion of shoulder, left  Left shoulder pain  Cervical pain  Neck muscle weakness  Decreased range of motion of neck      Subjective Assessment - 02/22/15 1103    Subjective Patient reports that she is having muscle spasms in her chest, which has happened before in the past; shoulder is still hurting worse than her neck   Pertinent History Problems with neck and back started about a year and a half ago; was laid off January 2015. Before she was laid off, she was doing the work of 3 positions with a lot of head turning, stress on her neck. Went to multiple MDs about pain.    Currently in Pain? Yes   Pain Score 4   L shoulder; cervical spine 2/10                         [facility] Adult PT Treatment/Exercise - 02/22/15 0001    Shoulder Exercises: Supine   Other Supine Exercises Isometric stabilization against random directions 1 rounds    Other Supine Exercises  Serratus anterior punches with 3# 1x10   Shoulder Exercises: Seated   Other Seated Exercises Attempted dowel work into ABD and flexion however patient unable to tolerate; able to tolerate ABD with therband 1x10 eacj sode    Other Seated Exercises Posterior shoulder rolls 1x20   Shoulder Exercises: Standing   Other Standing Exercises Shoulder pendulums all directions   Manual Therapy   Manual Therapy Joint mobilization   Manual therapy comments joint oscillations to L shoudler for pain reduction    Neck Exercises: Stretches   Upper Trapezius Stretch 3 reps;30 seconds   Corner Stretch 3 reps;30 seconds   Other Neck Stretches --                PT Education - 02/22/15 1152    Education provided No          PT Short Term Goals - 02/18/15 0826     PT SHORT TERM GOAL #1   Title Patient will improve her cervical range of motion by at least 10 degrees on all planes to assist her in better performing functional tasks    Baseline 7/22- has met in some planes but not all    Time 4   Period Weeks   Status On-going   PT SHORT TERM GOAL #2   Title Patient will complain of no more than 4/10 pain in her neck during all functional tasks and activities involved in caring for her mother    Baseline 7/22- does not have her mom anymore (at [employer]), but pain is still around 4-5/10 during functional tasks    Time 4   Period Days   Status On-going   PT SHORT TERM GOAL #3   Title Patient will demonstrate improved bilateral shoulder  and bilateral shoulder girdle strength to at least 4/5 in order to improve regional stability and reduce pain    Baseline 7/20 improving but still not at goal    Time 4   Period Weeks   Status On-going   PT SHORT TERM GOAL #4   Title Patient will be able to verbally state the importance of maintaining good posture in reducing her overall pain and will also be able to maintain good posture and cervical mechanics 90% of the time    Time 4   Period Weeks   Status Achieved   PT SHORT TERM GOAL #5   Title Patient will improve L shoulder ROM by at least 15 degrees all planes with no increase in pain    Baseline 7/22- still being limited by pain    Time 4   Period Weeks   Status On-going           PT Long Term Goals - 02/18/15 0174    PT LONG TERM GOAL #1   Title Patient will be independent in correctly and consistently performing appropriate HEP for both neck and shoulder  in order to assist in the management of her condition   Time 8   Period Weeks   Status Achieved   PT LONG TERM GOAL #2   Title Patient will demonstrate at least 4/5 cervical strength on all planes of motion and will demonstrate at least 4+/5 in bilateral shoulder/shoulder girdle musculature    Time 8   Period Weeks   Status On-going   PT  LONG TERM GOAL #3   Title Patient will demonstrate the ability to easily perform cervical rotation either direction at least 70 degrees with pain no more than 1/10 in order  to allow her to safely drive and perform functional tasks    Baseline 7/22- pain has decreased but has not met ROM component    Time 8   Period Weeks   Status Partially Met   PT LONG TERM GOAL #4   Title Patient will be able to lie on her back with cervical pain at 0/10 in order to allow her to get a good night's sleep and reduce environmental stress; patient will also state that she is having 0/10 pain throughout functional activities during her day    Baseline 7/22- sleeping on back is OK, still can't lie on L side , still having pain throughout day    Time 8   Period Weeks   Status Partially Met   PT LONG TERM GOAL #5   Title Patient will be able to independently wash her hair with both arms with pain no more than 1/10 and no residual soreness after task    Time 8   Period Weeks   Status On-going   PT LONG TERM GOAL #6   Title Patient will be able to independently put on shirts and jackets with no pain and no ROM restrictions and no residual soreness after task    Time 8   Period Weeks   Status On-going               Plan - 02/22/15 1154    Clinical Impression Statement Patient limited by pain today, reports that she is having muscle spasms in both pecs, which she has had before. Very limited in what she was able to do today, performed exercises per pain tolerance. Performed joint oscillations to reduce and manage pain multiple times through session today.    Pt will benefit from skilled therapeutic intervention in order to improve on the following deficits Decreased coordination;Decreased range of motion;Impaired flexibility;Improper body mechanics;Postural dysfunction;Decreased activity tolerance;Decreased balance;Pain;Impaired UE functional use;Increased muscle spasms;Decreased mobility;Decreased  strength;Impaired perceived functional ability   Rehab Potential Good   PT Frequency Other (comment)   PT Duration Other (comment)   PT Treatment/Interventions ADLs/Self Care Home Management;Neuromuscular re-education;Biofeedback;Functional mobility training;Patient/family education;Cryotherapy;Therapeutic activities;Therapeutic exercise;Manual techniques;Energy conservation;Moist Heat;Balance training;Traction   PT Next Visit Plan Continue to progress ER as tolerated, adding 1 pound weight in sidelying or yellow tband in standing if painfree. Continue with scapular stabilization   PT Home Exercise Plan continue with stabilization exercises in clinci and home as tolerated.    Consulted and Agree with Plan of Care Patient        Problem List Patient Active Problem List   Diagnosis Date Noted  . Cervical stenosis of spinal canal 08/06/2014  . Muscle weakness (generalized) 11/12/2013  . Tight fascia 11/12/2013  . Decreased range of motion of shoulder 11/12/2013  . Cervical spondylosis without myelopathy 10/29/2013  . S/P carpal tunnel release 02/19/2013  . CTS (carpal tunnel syndrome) 02/19/2013  . SHOULDER PAIN 10/06/2007  . IMPINGEMENT SYNDROME 10/06/2007  . RUPTURE ROTATOR CUFF 10/06/2007  . HIGH BLOOD PRESSURE 10/03/2007    Deniece Ree PT, DPT [facility] [address] [place], [place], [address] Phone: [phone]   Fax:  [phone]

## 2015-02-25 ENCOUNTER — Telehealth (HOSPITAL_COMMUNITY): Payer: Self-pay | Admitting: Physical Therapy

## 2015-02-25 ENCOUNTER — Ambulatory Visit (HOSPITAL_COMMUNITY): Payer: 59 | Admitting: Physical Therapy

## 2015-02-25 NOTE — Telephone Encounter (Signed)
She is sick and can not come in today

## 2015-03-01 ENCOUNTER — Ambulatory Visit (HOSPITAL_COMMUNITY): Payer: 59 | Attending: Neurosurgery | Admitting: Physical Therapy

## 2015-03-01 DIAGNOSIS — M5382 Other specified dorsopathies, cervical region: Secondary | ICD-10-CM | POA: Diagnosis present

## 2015-03-01 DIAGNOSIS — M7582 Other shoulder lesions, left shoulder: Secondary | ICD-10-CM | POA: Diagnosis present

## 2015-03-01 DIAGNOSIS — M25512 Pain in left shoulder: Secondary | ICD-10-CM | POA: Diagnosis present

## 2015-03-01 DIAGNOSIS — M542 Cervicalgia: Secondary | ICD-10-CM | POA: Diagnosis present

## 2015-03-01 DIAGNOSIS — R29898 Other symptoms and signs involving the musculoskeletal system: Secondary | ICD-10-CM | POA: Insufficient documentation

## 2015-03-01 DIAGNOSIS — M25612 Stiffness of left shoulder, not elsewhere classified: Secondary | ICD-10-CM

## 2015-03-01 NOTE — Therapy (Signed)
[place], [place], [address] Phone: [phone]   Fax:  [phone]  Physical Therapy Treatment  Patient Details  Name: Kimberly [name] MRN: [id number] Date of Birth: Apr 10, 1961 Referring Provider:  Leeroy Cha, MD  Encounter Date: 03/01/2015      PT End of Session - 03/01/15 1142    Visit Number 19   Number of Visits 23   Date for PT Re-Evaluation 03/18/15   Authorization Type U[employer]  (23 visit limit for PT)   Authorization Time Period 01/18/15 to 03/20/15   Authorization - Visit Number 19   Authorization - Number of Visits 23   PT Start Time 1101   PT Stop Time 1141   PT Time Calculation (min) 40 min   Activity Tolerance Patient limited by pain;Patient tolerated treatment well   Behavior During Therapy [facility] for tasks assessed/performed      Past Medical History  Diagnosis Date  . COPD (chronic obstructive pulmonary disease)   . Herpes   . Hypertension     pt. doesn't see cardiologist, followed for HTN by Dr. Gerarda Fraction  . Shortness of breath dyspnea   . Anxiety     takes alprazolam - rare use   . GERD (gastroesophageal reflux disease)     pt. reports that its better, no meds in use at this time- 2015  . Arthritis     cervical spondylosis     Past Surgical History  Procedure Laterality Date  . Abdominal hysterectomy    . Rotator cuff repair Left   . Foreign body removal Left     knee-as child  . Carpal tunnel release Left 02/16/2013    Procedure: CARPAL TUNNEL RELEASE;  Surgeon: Carole Civil, MD;  Location: [facility];  Service: Orthopedics;  Laterality: Left;  . Carpal tunnel release Right 03/27/2013    Procedure: RIGHT CARPAL TUNNEL RELEASE;  Surgeon: Carole Civil, MD;  Location: [facility];  Service: Orthopedics;  Laterality: Right;  . Cesarean section      x2  . Anterior cervical decomp/discectomy fusion N/A 08/06/2014    Procedure: ANTERIOR CERVICAL DECOMPRESSION/DISCECTOMY FUSION CERVICAL  THREE-FOUR,CERVICAL SIX-SEVEN ,CERVICAL SEVEN-THORACIC ONE;  Surgeon: Floyce Stakes, MD;  Location: [facility];  Service: Neurosurgery;  Laterality: N/A;    There were no vitals filed for this visit.  Visit Diagnosis:  Shoulder weakness  Decreased range of motion of shoulder, left  Left shoulder pain  Cervical pain  Neck muscle weakness  Decreased range of motion of neck      Subjective Assessment - 03/01/15 1102    Subjective Patient reports she had a rough weekend, had a LOT of shoulder pain and her neck was hurting so bad that she actually ended up putting her brace back on to try to help with the pain. Reports that she always has more pain when she is sick. Her husband helps a little bit but does not do a lot in terms of housework, reports that MD does not want him doing a whole lot.  Reports that she has recently noticed that both of her hands have been numb recently, started a couple of weeks ago, not worsening but staying the same, not improving but staying right in thenar eminance, no numbness/tingling in her arms or shoulders. Also reports that her husband thought her neck was a bit swollen on Saturday, requests that PT take another look to make sure. Patient also reports that she felt a "knot" in her  throat that felt like it was keeping her from throwing up, had to put her hand around her neck to keep it still or from choking her.     Pertinent History Problems with neck and back started about a year and a half ago; was laid off January 2015. Before she was laid off, she was doing the work of 3 positions with a lot of head turning, stress on her neck. Went to multiple MDs about pain.    Currently in Pain? Yes   Pain Score 5   neck is about a 5/10, shoulder is a 3/10                         [facility] Adult PT Treatment/Exercise - 03/01/15 0001    Neck Exercises: Seated   Neck Retraction 20 reps   Other Seated Exercise Scapular retractions 1x20, posterior shoulder  rolls 1x20   Other Seated Exercise Cervical and thoracic 3D excursions 1x15    Shoulder Exercises: Seated   Other Seated Exercises Dowel AAROM 1x10 ABD and flexion    Manual Therapy   Manual Therapy Soft tissue mobilization   Soft tissue mobilization bilateral cervical extensors and upper traps to reduce muscle guarding and tension                 PT Education - 03/01/15 1141    Education provided Yes   Education Details education regarding possible cause of numbness in hands, advised patient to also speak to MD; overall advised patient to speak to MD regarding new symptoms    Person(s) Educated Patient   Methods Explanation   Comprehension Verbalized understanding          PT Short Term Goals - 02/18/15 0826    PT SHORT TERM GOAL #1   Title Patient will improve her cervical range of motion by at least 10 degrees on all planes to assist her in better performing functional tasks    Baseline 7/22- has met in some planes but not all    Time 4   Period Weeks   Status On-going   PT SHORT TERM GOAL #2   Title Patient will complain of no more than 4/10 pain in her neck during all functional tasks and activities involved in caring for her mother    Baseline 7/22- does not have her mom anymore (at [employer]), but pain is still around 4-5/10 during functional tasks    Time 4   Period Days   Status On-going   PT SHORT TERM GOAL #3   Title Patient will demonstrate improved bilateral shoulder  and bilateral shoulder girdle strength to at least 4/5 in order to improve regional stability and reduce pain    Baseline 7/20 improving but still not at goal    Time 4   Period Weeks   Status On-going   PT SHORT TERM GOAL #4   Title Patient will be able to verbally state the importance of maintaining good posture in reducing her overall pain and will also be able to maintain good posture and cervical mechanics 90% of the time    Time 4   Period Weeks   Status Achieved   PT SHORT TERM GOAL  #5   Title Patient will improve L shoulder ROM by at least 15 degrees all planes with no increase in pain    Baseline 7/22- still being limited by pain    Time 4   Period Weeks   Status  On-going           PT Long Term Goals - 02/18/15 0829    PT LONG TERM GOAL #1   Title Patient will be independent in correctly and consistently performing appropriate HEP for both neck and shoulder  in order to assist in the management of her condition   Time 8   Period Weeks   Status Achieved   PT LONG TERM GOAL #2   Title Patient will demonstrate at least 4/5 cervical strength on all planes of motion and will demonstrate at least 4+/5 in bilateral shoulder/shoulder girdle musculature    Time 8   Period Weeks   Status On-going   PT LONG TERM GOAL #3   Title Patient will demonstrate the ability to easily perform cervical rotation either direction at least 70 degrees with pain no more than 1/10 in order to allow her to safely drive and perform functional tasks    Baseline 7/22- pain has decreased but has not met ROM component    Time 8   Period Weeks   Status Partially Met   PT LONG TERM GOAL #4   Title Patient will be able to lie on her back with cervical pain at 0/10 in order to allow her to get a good night's sleep and reduce environmental stress; patient will also state that she is having 0/10 pain throughout functional activities during her day    Baseline 7/22- sleeping on back is OK, still can't lie on L side , still having pain throughout day    Time 8   Period Weeks   Status Partially Met   PT LONG TERM GOAL #5   Title Patient will be able to independently wash her hair with both arms with pain no more than 1/10 and no residual soreness after task    Time 8   Period Weeks   Status On-going   PT LONG TERM GOAL #6   Title Patient will be able to independently put on shirts and jackets with no pain and no ROM restrictions and no residual soreness after task    Time 8   Period Weeks    Status On-going               Plan - 03/01/15 1143    Clinical Impression Statement Patient reported that she had had a very rough weekend (See subjective section for details), also requested that PT take a look at neck since her husband had noted increased edema in the back of her neck this past weekend. Due to area of presentation and due to patient reporting no numbness or tingling in arms, just thenar eminance of both hands, suspect that numbness may possibly be related to past wrist issues. Examined neck and did note some puffiness around genearl area of C7 however area was not tender to palpation, was not increased in temperature, and did not appear inflamed- no concerns for acute change at this point but will monitor. Focused on cervical spine today in order to reduce pain with some gentle AAROM for L shoulder. Pain decreased to 1/10 at end of session.     Pt will benefit from skilled therapeutic intervention in order to improve on the following deficits Decreased coordination;Decreased range of motion;Impaired flexibility;Improper body mechanics;Postural dysfunction;Decreased activity tolerance;Decreased balance;Pain;Impaired UE functional use;Increased muscle spasms;Decreased mobility;Decreased strength;Impaired perceived functional ability   Rehab Potential Good   PT Frequency Other (comment)  4 more visits    PT Duration Other (comment)  4  more visits    PT Treatment/Interventions ADLs/Self Care Home Management;Neuromuscular re-education;Biofeedback;Functional mobility training;Patient/family education;Cryotherapy;Therapeutic activities;Therapeutic exercise;Manual techniques;Energy conservation;Moist Heat;Balance training;Traction   PT Next Visit Plan Continue to progress ER as tolerated, adding 1 pound weight in sidelying or yellow tband in standing if painfree. Continue with scapular stabilization. Address cervical spine as needed.    PT Home Exercise Plan continue with  stabilization exercises in clinci and home as tolerated.    Consulted and Agree with Plan of Care Patient        Problem List Patient Active Problem List   Diagnosis Date Noted  . Cervical stenosis of spinal canal 08/06/2014  . Muscle weakness (generalized) 11/12/2013  . Tight fascia 11/12/2013  . Decreased range of motion of shoulder 11/12/2013  . Cervical spondylosis without myelopathy 10/29/2013  . S/P carpal tunnel release 02/19/2013  . CTS (carpal tunnel syndrome) 02/19/2013  . SHOULDER PAIN 10/06/2007  . IMPINGEMENT SYNDROME 10/06/2007  . RUPTURE ROTATOR CUFF 10/06/2007  . HIGH BLOOD PRESSURE 10/03/2007    Deniece Ree PT, DPT [facility] [address] [place], [place], [address] Phone: [phone]   Fax:  [phone]

## 2015-03-04 ENCOUNTER — Ambulatory Visit (HOSPITAL_COMMUNITY): Payer: 59 | Admitting: Physical Therapy

## 2015-03-04 DIAGNOSIS — R29898 Other symptoms and signs involving the musculoskeletal system: Secondary | ICD-10-CM | POA: Diagnosis not present

## 2015-03-04 DIAGNOSIS — M5382 Other specified dorsopathies, cervical region: Secondary | ICD-10-CM

## 2015-03-04 DIAGNOSIS — M542 Cervicalgia: Secondary | ICD-10-CM

## 2015-03-04 DIAGNOSIS — M25612 Stiffness of left shoulder, not elsewhere classified: Secondary | ICD-10-CM

## 2015-03-04 NOTE — Therapy (Signed)
[place], [place], [address] Phone: [phone]   Fax:  [phone]  Physical Therapy Treatment  Patient Details  Name: Kimberly [name] MRN: [id number] Date of Birth: Dec 30, 1960 Referring Provider:  Leeroy Cha, MD  Encounter Date: 03/04/2015      PT End of Session - 03/04/15 0841    Visit Number 20   Number of Visits 23   Date for PT Re-Evaluation 03/18/15   Authorization Type [employer]  (23 visit limit for PT)   Authorization Time Period 01/18/15 to 03/20/15   Authorization - Visit Number 61   Authorization - Number of Visits 23   PT Start Time 0800   PT Stop Time 0842   PT Time Calculation (min) 42 min   Activity Tolerance Patient tolerated treatment well   Behavior During Therapy [facility] for tasks assessed/performed      Past Medical History  Diagnosis Date  . COPD (chronic obstructive pulmonary disease)   . Herpes   . Hypertension     pt. doesn't see cardiologist, followed for HTN by Dr. Gerarda Fraction  . Shortness of breath dyspnea   . Anxiety     takes alprazolam - rare use   . GERD (gastroesophageal reflux disease)     pt. reports that its better, no meds in use at this time- 2015  . Arthritis     cervical spondylosis     Past Surgical History  Procedure Laterality Date  . Abdominal hysterectomy    . Rotator cuff repair Left   . Foreign body removal Left     knee-as child  . Carpal tunnel release Left 02/16/2013    Procedure: CARPAL TUNNEL RELEASE;  Surgeon: Carole Civil, MD;  Location: [facility];  Service: Orthopedics;  Laterality: Left;  . Carpal tunnel release Right 03/27/2013    Procedure: RIGHT CARPAL TUNNEL RELEASE;  Surgeon: Carole Civil, MD;  Location: [facility];  Service: Orthopedics;  Laterality: Right;  . Cesarean section      x2  . Anterior cervical decomp/discectomy fusion N/A 08/06/2014    Procedure: ANTERIOR CERVICAL DECOMPRESSION/DISCECTOMY FUSION CERVICAL THREE-FOUR,CERVICAL  SIX-SEVEN ,CERVICAL SEVEN-THORACIC ONE;  Surgeon: Floyce Stakes, MD;  Location: [place];  Service: Neurosurgery;  Laterality: N/A;    There were no vitals filed for this visit.  Visit Diagnosis:  Shoulder weakness  Cervical pain  Neck muscle weakness  Decreased range of motion of shoulder, left      Subjective Assessment - 03/04/15 0804    Subjective Pt reports that she has had pretty constant neck and shoulder pain for the past 2 days. She is very limited in her ability to complete even light housework. She reports that most of the pain is in her upper traps today, rates pain a 5/10 in neck and shoulders.   Currently in Pain? Yes   Pain Score 5    Pain Location Shoulder  neck and shoulders   Pain Orientation Right;Left                         [facility] Adult PT Treatment/Exercise - 03/04/15 0001    Neck Exercises: Seated   Neck Retraction 20 reps   Other Seated Exercise Scapular retractions 1x20, posterior shoulder rolls 1x20   Shoulder Exercises: Standing   Other Standing Exercises Shoulder pendulums all directions   Other Standing Exercises Isometric holds for L shoulder ER, IR, flexion, abduction 5" hold x 10  Shoulder Exercises: Pulleys   Flexion 2 minutes   ABduction 2 minutes   Shoulder Exercises: ROM/Strengthening   UBE (Upper Arm Bike) 3 minutes forward   Manual Therapy   Manual Therapy Soft tissue mobilization   Soft tissue mobilization bilateral cervical extensors and upper traps to reduce muscle guarding and tension                   PT Short Term Goals - 02/18/15 0826    PT SHORT TERM GOAL #1   Title Patient will improve her cervical range of motion by at least 10 degrees on all planes to assist her in better performing functional tasks    Baseline 7/22- has met in some planes but not all    Time 4   Period Weeks   Status On-going   PT SHORT TERM GOAL #2   Title Patient will complain of no more than 4/10 pain in her neck during  all functional tasks and activities involved in caring for her mother    Baseline 7/22- does not have her mom anymore (at [employer]), but pain is still around 4-5/10 during functional tasks    Time 4   Period Days   Status On-going   PT SHORT TERM GOAL #3   Title Patient will demonstrate improved bilateral shoulder  and bilateral shoulder girdle strength to at least 4/5 in order to improve regional stability and reduce pain    Baseline 7/20 improving but still not at goal    Time 4   Period Weeks   Status On-going   PT SHORT TERM GOAL #4   Title Patient will be able to verbally state the importance of maintaining good posture in reducing her overall pain and will also be able to maintain good posture and cervical mechanics 90% of the time    Time 4   Period Weeks   Status Achieved   PT SHORT TERM GOAL #5   Title Patient will improve L shoulder ROM by at least 15 degrees all planes with no increase in pain    Baseline 7/22- still being limited by pain    Time 4   Period Weeks   Status On-going           PT Long Term Goals - 02/18/15 1610    PT LONG TERM GOAL #1   Title Patient will be independent in correctly and consistently performing appropriate HEP for both neck and shoulder  in order to assist in the management of her condition   Time 8   Period Weeks   Status Achieved   PT LONG TERM GOAL #2   Title Patient will demonstrate at least 4/5 cervical strength on all planes of motion and will demonstrate at least 4+/5 in bilateral shoulder/shoulder girdle musculature    Time 8   Period Weeks   Status On-going   PT LONG TERM GOAL #3   Title Patient will demonstrate the ability to easily perform cervical rotation either direction at least 70 degrees with pain no more than 1/10 in order to allow her to safely drive and perform functional tasks    Baseline 7/22- pain has decreased but has not met ROM component    Time 8   Period Weeks   Status Partially Met   PT LONG TERM GOAL #4    Title Patient will be able to lie on her back with cervical pain at 0/10 in order to allow her to get a good night's sleep  and reduce environmental stress; patient will also state that she is having 0/10 pain throughout functional activities during her day    Baseline 7/22- sleeping on back is OK, still can't lie on L side , still having pain throughout day    Time 8   Period Weeks   Status Partially Met   PT LONG TERM GOAL #5   Title Patient will be able to independently wash her hair with both arms with pain no more than 1/10 and no residual soreness after task    Time 8   Period Weeks   Status On-going   PT LONG TERM GOAL #6   Title Patient will be able to independently put on shirts and jackets with no pain and no ROM restrictions and no residual soreness after task    Time 8   Period Weeks   Status On-going               Plan - 03/04/15 0841    Clinical Impression Statement Pt continues to express concerns that she has swelling in her neck. Though she has tenderness in her cervical musculature, there is no significant edema or heat present in cervical region. Pt reported decreased pain following manual therapy, and was able to complete all therex without c/o increased pain. Pt reoprted that her pain had decreased to 2/10 post treatment.    PT Home Exercise Plan Continue with manual therapy PRN, isometric exercises for stabilization, progress strengthening as tolerated        Problem List Patient Active Problem List   Diagnosis Date Noted  . Cervical stenosis of spinal canal 08/06/2014  . Muscle weakness (generalized) 11/12/2013  . Tight fascia 11/12/2013  . Decreased range of motion of shoulder 11/12/2013  . Cervical spondylosis without myelopathy 10/29/2013  . S/P carpal tunnel release 02/19/2013  . CTS (carpal tunnel syndrome) 02/19/2013  . SHOULDER PAIN 10/06/2007  . IMPINGEMENT SYNDROME 10/06/2007  . RUPTURE ROTATOR CUFF 10/06/2007  . HIGH BLOOD PRESSURE  10/03/2007    Hilma Favors, PT, DPT [phone] 03/04/2015, 8:48 AM  [place], [place], [address] Phone: [phone]   Fax:  [phone]

## 2015-03-08 ENCOUNTER — Ambulatory Visit (HOSPITAL_COMMUNITY): Payer: 59 | Admitting: Physical Therapy

## 2015-03-08 DIAGNOSIS — M542 Cervicalgia: Secondary | ICD-10-CM

## 2015-03-08 DIAGNOSIS — M5382 Other specified dorsopathies, cervical region: Secondary | ICD-10-CM

## 2015-03-08 DIAGNOSIS — R29898 Other symptoms and signs involving the musculoskeletal system: Secondary | ICD-10-CM | POA: Diagnosis not present

## 2015-03-08 DIAGNOSIS — M25612 Stiffness of left shoulder, not elsewhere classified: Secondary | ICD-10-CM

## 2015-03-08 DIAGNOSIS — M25512 Pain in left shoulder: Secondary | ICD-10-CM

## 2015-03-08 NOTE — Therapy (Signed)
[place], [place], [address] Phone: [phone]   Fax:  [phone]  Physical Therapy Treatment  Patient Details  Name: Kimberly [name] MRN: [id number] Date of Birth: [birth year] Referring Provider:  Redmond School, MD  Encounter Date: 03/08/2015      PT End of Session - 03/08/15 1524    Visit Number 21   Number of Visits 23   Date for PT Re-Evaluation 03/18/15   Authorization Type UHC  (23 visit limit for PT)   Authorization Time Period 01/18/15 to 03/20/15   Authorization - Visit Number 21   Authorization - Number of Visits 23   PT Start Time 3009   PT Stop Time 1512   PT Time Calculation (min) 36 min   Activity Tolerance Patient tolerated treatment well   Behavior During Therapy [facility] for tasks assessed/performed      Past Medical History  Diagnosis Date  . COPD (chronic obstructive pulmonary disease)   . Herpes   . Hypertension     pt. doesn't see cardiologist, followed for HTN by Dr. Gerarda Fraction  . Shortness of breath dyspnea   . Anxiety     takes alprazolam - rare use   . GERD (gastroesophageal reflux disease)     pt. reports that its better, no meds in use at this time- 2015  . Arthritis     cervical spondylosis     Past Surgical History  Procedure Laterality Date  . Abdominal hysterectomy    . Rotator cuff repair Left   . Foreign body removal Left     knee-as child  . Carpal tunnel release Left 02/16/2013    Procedure: CARPAL TUNNEL RELEASE;  Surgeon: Carole Civil, MD;  Location: [facility];  Service: Orthopedics;  Laterality: Left;  . Carpal tunnel release Right 03/27/2013    Procedure: RIGHT CARPAL TUNNEL RELEASE;  Surgeon: Carole Civil, MD;  Location: [facility];  Service: Orthopedics;  Laterality: Right;  . Cesarean section      x2  . Anterior cervical decomp/discectomy fusion N/A 08/06/2014    Procedure: ANTERIOR CERVICAL DECOMPRESSION/DISCECTOMY FUSION CERVICAL THREE-FOUR,CERVICAL  SIX-SEVEN ,CERVICAL SEVEN-THORACIC ONE;  Surgeon: Floyce Stakes, MD;  Location: [facility];  Service: Neurosurgery;  Laterality: N/A;    There were no vitals filed for this visit.  Visit Diagnosis:  Shoulder weakness  Cervical pain  Neck muscle weakness  Decreased range of motion of shoulder, left  Left shoulder pain  Decreased range of motion of neck      Subjective Assessment - 03/08/15 1439    Subjective Patient reports she is doing OK today, having some increased pain with driving in both of her shoulders recently though    Pertinent History Problems with neck and back started about a year and a half ago; was laid off January 2015. Before she was laid off, she was doing the work of 3 positions with a lot of head turning, stress on her neck. Went to multiple MDs about pain.    Currently in Pain? Yes   Pain Score 3    Pain Location Neck  shoulder and neck    Pain Orientation Left                         [facility] Adult PT Treatment/Exercise - 03/08/15 0001    Neck Exercises: Seated   Cervical Isometrics --   Neck Retraction 20 reps   Other Seated Exercise Scapular  retractions x20, posterior shoulder rolls 1x20   Other Seated Exercise Cervical and thoracic 3D excursions 1x10    Shoulder Exercises: Seated   Other Seated Exercises [place] AAROM 1x10 ABD and flexion    Shoulder Exercises: Standing   Other Standing Exercises Shoulder pendulums all directions   Other Standing Exercises Isometric holds for L shoulder ER, IR, flexion, abduction 5" hold x 10  attempted IR but painful today so avoided exercise    Manual Therapy   Manual Therapy Soft tissue mobilization   Soft tissue mobilization bilateral cervical extensors and upper traps to reduce muscle guarding and tension    Neck Exercises: Stretches   Upper Trapezius Stretch 30 seconds;2 reps   Corner Stretch 3 reps;30 seconds                PT Education - 03/08/15 1626    Education provided Yes    Education Details educated to see MD about numbness/tingling in hands/arms if it gets worse    Person(s) Educated Patient   Methods Explanation   Comprehension Verbalized understanding          PT Short Term Goals - 02/18/15 0826    PT SHORT TERM GOAL #1   Title Patient will improve her cervical range of motion by at least 10 degrees on all planes to assist her in better performing functional tasks    Baseline 7/22- has met in some planes but not all    Time 4   Period Weeks   Status On-going   PT SHORT TERM GOAL #2   Title Patient will complain of no more than 4/10 pain in her neck during all functional tasks and activities involved in caring for her mother    Baseline 7/22- does not have her mom anymore (at [employer]), but pain is still around 4-5/10 during functional tasks    Time 4   Period Days   Status On-going   PT SHORT TERM GOAL #3   Title Patient will demonstrate improved bilateral shoulder  and bilateral shoulder girdle strength to at least 4/5 in order to improve regional stability and reduce pain    Baseline 7/20 improving but still not at goal    Time 4   Period Weeks   Status On-going   PT SHORT TERM GOAL #4   Title Patient will be able to verbally state the importance of maintaining good posture in reducing her overall pain and will also be able to maintain good posture and cervical mechanics 90% of the time    Time 4   Period Weeks   Status Achieved   PT SHORT TERM GOAL #5   Title Patient will improve L shoulder ROM by at least 15 degrees all planes with no increase in pain    Baseline 7/22- still being limited by pain    Time 4   Period Weeks   Status On-going           PT Long Term Goals - 02/18/15 8101    PT LONG TERM GOAL #1   Title Patient will be independent in correctly and consistently performing appropriate HEP for both neck and shoulder  in order to assist in the management of her condition   Time 8   Period Weeks   Status Achieved   PT LONG  TERM GOAL #2   Title Patient will demonstrate at least 4/5 cervical strength on all planes of motion and will demonstrate at least 4+/5 in bilateral shoulder/shoulder girdle musculature  Time 8   Period Weeks   Status On-going   PT LONG TERM GOAL #3   Title Patient will demonstrate the ability to easily perform cervical rotation either direction at least 70 degrees with pain no more than 1/10 in order to allow her to safely drive and perform functional tasks    Baseline 7/22- pain has decreased but has not met ROM component    Time 8   Period Weeks   Status Partially Met   PT LONG TERM GOAL #4   Title Patient will be able to lie on her back with cervical pain at 0/10 in order to allow her to get a good night's sleep and reduce environmental stress; patient will also state that she is having 0/10 pain throughout functional activities during her day    Baseline 7/22- sleeping on back is OK, still can't lie on L side , still having pain throughout day    Time 8   Period Weeks   Status Partially Met   PT LONG TERM GOAL #5   Title Patient will be able to independently wash her hair with both arms with pain no more than 1/10 and no residual soreness after task    Time 8   Period Weeks   Status On-going   PT LONG TERM GOAL #6   Title Patient will be able to independently put on shirts and jackets with no pain and no ROM restrictions and no residual soreness after task    Time 8   Period Weeks   Status On-going               Plan - 03/08/15 1527    Clinical Impression Statement Continued functional exercises and stretches today with focus on cervical musculature and scapular stabilizers today; patient continues to experience pain in her neck and shoulder, also having continued numbness and tingling in her arms and hands. ADvised patient to speak to MD if numbness/tingling in hands and arms increasss or continue.    Pt will benefit from skilled therapeutic intervention in order to  improve on the following deficits Decreased coordination;Decreased range of motion;Impaired flexibility;Improper body mechanics;Postural dysfunction;Decreased activity tolerance;Decreased balance;Pain;Impaired UE functional use;Increased muscle spasms;Decreased mobility;Decreased strength;Impaired perceived functional ability   Rehab Potential Good   PT Frequency --  2 more visits    PT Duration Other (comment)  2 visits    PT Treatment/Interventions ADLs/Self Care Home Management;Neuromuscular re-education;Biofeedback;Functional mobility training;Patient/family education;Cryotherapy;Therapeutic activities;Therapeutic exercise;Manual techniques;Energy conservation;Moist Heat;Balance training;Traction   PT Next Visit Plan Continue to progress ER as tolerated, adding 1 pound weight in sidelying or yellow tband in standing if painfree. Continue with scapular stabilization. Address cervical spine as needed.    PT Home Exercise Plan Continue with manual therapy PRN, isometric exercises for stabilization, progress strengthening as tolerated   Consulted and Agree with Plan of Care Patient        Problem List Patient Active Problem List   Diagnosis Date Noted  . Cervical stenosis of spinal canal 08/06/2014  . Muscle weakness (generalized) 11/12/2013  . Tight fascia 11/12/2013  . Decreased range of motion of shoulder 11/12/2013  . Cervical spondylosis without myelopathy 10/29/2013  . S/P carpal tunnel release 02/19/2013  . CTS (carpal tunnel syndrome) 02/19/2013  . SHOULDER PAIN 10/06/2007  . IMPINGEMENT SYNDROME 10/06/2007  . RUPTURE ROTATOR CUFF 10/06/2007  . HIGH BLOOD PRESSURE 10/03/2007    Deniece Ree PT, DPT [place],  [place], [address] Phone: [phone]   Fax:  [phone]

## 2015-03-10 ENCOUNTER — Ambulatory Visit (HOSPITAL_COMMUNITY): Payer: 59 | Admitting: Physical Therapy

## 2015-03-10 DIAGNOSIS — M5382 Other specified dorsopathies, cervical region: Secondary | ICD-10-CM

## 2015-03-10 DIAGNOSIS — M25612 Stiffness of left shoulder, not elsewhere classified: Secondary | ICD-10-CM

## 2015-03-10 DIAGNOSIS — M542 Cervicalgia: Secondary | ICD-10-CM

## 2015-03-10 DIAGNOSIS — R29898 Other symptoms and signs involving the musculoskeletal system: Secondary | ICD-10-CM | POA: Diagnosis not present

## 2015-03-10 DIAGNOSIS — M25512 Pain in left shoulder: Secondary | ICD-10-CM

## 2015-03-10 NOTE — Therapy (Signed)
[place], [place], [address] Phone: [phone]   Fax:  [phone]  Physical Therapy Treatment  Patient Details  Name: Kimberly [name] MRN: [id number] Date of Birth: [birth year] Referring Provider:  Leeroy Cha, MD  Encounter Date: 03/10/2015      PT End of Session - 03/10/15 1702    Visit Number 22   Number of Visits 23   Date for PT Re-Evaluation 03/18/15   Authorization Type U[employer]  (23 visit limit for PT)   Authorization Time Period 01/18/15 to 03/20/15   Authorization - Visit Number 62   Authorization - Number of Visits 23   PT Start Time 1700   PT Stop Time 1514   PT Time Calculation (min) 41 min   Activity Tolerance Patient tolerated treatment well   Behavior During Therapy [facility] for tasks assessed/performed      Past Medical History  Diagnosis Date  . COPD (chronic obstructive pulmonary disease)   . Herpes   . Hypertension     pt. doesn't see cardiologist, followed for HTN by Dr. Gerarda Fraction  . Shortness of breath dyspnea   . Anxiety     takes alprazolam - rare use   . GERD (gastroesophageal reflux disease)     pt. reports that its better, no meds in use at this time- 2015  . Arthritis     cervical spondylosis     Past Surgical History  Procedure Laterality Date  . Abdominal hysterectomy    . Rotator cuff repair Left   . Foreign body removal Left     knee-as child  . Carpal tunnel release Left 02/16/2013    Procedure: CARPAL TUNNEL RELEASE;  Surgeon: Carole Civil, MD;  Location: [facility];  Service: Orthopedics;  Laterality: Left;  . Carpal tunnel release Right 03/27/2013    Procedure: RIGHT CARPAL TUNNEL RELEASE;  Surgeon: Carole Civil, MD;  Location: [facility];  Service: Orthopedics;  Laterality: Right;  . Cesarean section      x2  . Anterior cervical decomp/discectomy fusion N/A 08/06/2014    Procedure: ANTERIOR CERVICAL DECOMPRESSION/DISCECTOMY FUSION CERVICAL THREE-FOUR,CERVICAL  SIX-SEVEN ,CERVICAL SEVEN-THORACIC ONE;  Surgeon: Floyce Stakes, MD;  Location: [facility];  Service: Neurosurgery;  Laterality: N/A;    There were no vitals filed for this visit.  Visit Diagnosis:  Shoulder weakness  Cervical pain  Neck muscle weakness  Decreased range of motion of shoulder, left  Left shoulder pain  Decreased range of motion of neck      Subjective Assessment - 03/10/15 1435    Subjective Patient reports she is doing OK today, feeling fairly stiff in her neck today but pain is mild    Pertinent History Problems with neck and back started about a year and a half ago; was laid off January 2015. Before she was laid off, she was doing the work of 3 positions with a lot of head turning, stress on her neck. Went to multiple MDs about pain.    Currently in Pain? Yes   Pain Score 3    Pain Location Neck  neck and shoulder    Pain Orientation Left                         [facility] Adult PT Treatment/Exercise - 03/10/15 0001    Neck Exercises: Seated   Other Seated Exercise --   Other Seated Exercise Cervical and thoracic 3D excursions 1x15  Shoulder Exercises: Supine   Other Supine Exercises AROM flexion/ABD within pain tolerance    Other Supine Exercises Serratus punches with 3# weight; isometric stabilizations x30 (3 sets of 10)   Manual Therapy   Manual Therapy Soft tissue mobilization   Soft tissue mobilization L cervical extensors and upper traps    Neck Exercises: Stretches   Upper Trapezius Stretch 3 reps;30 seconds   Corner Stretch 3 reps;30 seconds                PT Education - 03/10/15 1701    Education provided Yes   Education Details educated to talk to  MD about mole on neck at next appointment if she is concerned about it   Person(s) Educated Patient   Methods Explanation   Comprehension Verbalized understanding          PT Short Term Goals - 02/18/15 0826    PT SHORT TERM GOAL #1   Title Patient will improve her  cervical range of motion by at least 10 degrees on all planes to assist her in better performing functional tasks    Baseline 7/22- has met in some planes but not all    Time 4   Period Weeks   Status On-going   PT SHORT TERM GOAL #2   Title Patient will complain of no more than 4/10 pain in her neck during all functional tasks and activities involved in caring for her mother    Baseline 7/22- does not have her mom anymore (at [employer]), but pain is still around 4-5/10 during functional tasks    Time 4   Period Days   Status On-going   PT SHORT TERM GOAL #3   Title Patient will demonstrate improved bilateral shoulder  and bilateral shoulder girdle strength to at least 4/5 in order to improve regional stability and reduce pain    Baseline 7/20 improving but still not at goal    Time 4   Period Weeks   Status On-going   PT SHORT TERM GOAL #4   Title Patient will be able to verbally state the importance of maintaining good posture in reducing her overall pain and will also be able to maintain good posture and cervical mechanics 90% of the time    Time 4   Period Weeks   Status Achieved   PT SHORT TERM GOAL #5   Title Patient will improve L shoulder ROM by at least 15 degrees all planes with no increase in pain    Baseline 7/22- still being limited by pain    Time 4   Period Weeks   Status On-going           PT Long Term Goals - 02/18/15 3570    PT LONG TERM GOAL #1   Title Patient will be independent in correctly and consistently performing appropriate HEP for both neck and shoulder  in order to assist in the management of her condition   Time 8   Period Weeks   Status Achieved   PT LONG TERM GOAL #2   Title Patient will demonstrate at least 4/5 cervical strength on all planes of motion and will demonstrate at least 4+/5 in bilateral shoulder/shoulder girdle musculature    Time 8   Period Weeks   Status On-going   PT LONG TERM GOAL #3   Title Patient will demonstrate the  ability to easily perform cervical rotation either direction at least 70 degrees with pain no more than 1/10 in order  to allow her to safely drive and perform functional tasks    Baseline 7/22- pain has decreased but has not met ROM component    Time 8   Period Weeks   Status Partially Met   PT LONG TERM GOAL #4   Title Patient will be able to lie on her back with cervical pain at 0/10 in order to allow her to get a good night's sleep and reduce environmental stress; patient will also state that she is having 0/10 pain throughout functional activities during her day    Baseline 7/22- sleeping on back is OK, still can't lie on L side , still having pain throughout day    Time 8   Period Weeks   Status Partially Met   PT LONG TERM GOAL #5   Title Patient will be able to independently wash her hair with both arms with pain no more than 1/10 and no residual soreness after task    Time 8   Period Weeks   Status On-going   PT LONG TERM GOAL #6   Title Patient will be able to independently put on shirts and jackets with no pain and no ROM restrictions and no residual soreness after task    Time 8   Period Weeks   Status On-going               Plan - 03/10/15 1703    Clinical Impression Statement Continued functional exercises and stretches today within pain tolerance; also continued soft tisue mobilzation of cervical musculature. No change in pain at end of session. Patient to be re-evaluated next session, discharged, and referred back to MD after final re-assessment next appointment.     Pt will benefit from skilled therapeutic intervention in order to improve on the following deficits Decreased coordination;Decreased range of motion;Impaired flexibility;Improper body mechanics;Postural dysfunction;Decreased activity tolerance;Decreased balance;Pain;Impaired UE functional use;Increased muscle spasms;Decreased mobility;Decreased strength;Impaired perceived functional ability   PT Frequency  Other (comment)  1 more visit    PT Duration Other (comment)  1 more session    PT Treatment/Interventions ADLs/Self Care Home Management;Neuromuscular re-education;Biofeedback;Functional mobility training;Patient/family education;Cryotherapy;Therapeutic activities;Therapeutic exercise;Manual techniques;Energy conservation;Moist Heat;Balance training;Traction   PT Next Visit Plan Discharge re-assessment    PT Home Exercise Plan Continue with manual therapy PRN, isometric exercises for stabilization, progress strengthening as tolerated   Consulted and Agree with Plan of Care Patient        Problem List Patient Active Problem List   Diagnosis Date Noted  . Cervical stenosis of spinal canal 08/06/2014  . Muscle weakness (generalized) 11/12/2013  . Tight fascia 11/12/2013  . Decreased range of motion of shoulder 11/12/2013  . Cervical spondylosis without myelopathy 10/29/2013  . S/P carpal tunnel release 02/19/2013  . CTS (carpal tunnel syndrome) 02/19/2013  . SHOULDER PAIN 10/06/2007  . IMPINGEMENT SYNDROME 10/06/2007  . RUPTURE ROTATOR CUFF 10/06/2007  . HIGH BLOOD PRESSURE 10/03/2007    Deniece Ree PT, DPT [place] [address] [place], [place], [address] Phone: [phone]   Fax:  [phone]

## 2015-03-15 ENCOUNTER — Ambulatory Visit (HOSPITAL_COMMUNITY): Payer: 59 | Admitting: Physical Therapy

## 2015-03-15 DIAGNOSIS — M5382 Other specified dorsopathies, cervical region: Secondary | ICD-10-CM

## 2015-03-15 DIAGNOSIS — M25612 Stiffness of left shoulder, not elsewhere classified: Secondary | ICD-10-CM

## 2015-03-15 DIAGNOSIS — R29898 Other symptoms and signs involving the musculoskeletal system: Secondary | ICD-10-CM | POA: Diagnosis not present

## 2015-03-15 DIAGNOSIS — M542 Cervicalgia: Secondary | ICD-10-CM

## 2015-03-15 DIAGNOSIS — M25512 Pain in left shoulder: Secondary | ICD-10-CM

## 2015-03-15 NOTE — Therapy (Signed)
[place], [place], [address] Phone: [phone]   Fax:  [phone]  Physical Therapy Treatment (Discharge Assessment)  Patient Details  Name: Kimberly [name] MRN: [id number] Date of Birth: 12-Feb-1961 Referring Provider:  Redmond School, MD  Encounter Date: 03/15/2015      PT End of Session - 03/15/15 1821    Visit Number 23   Number of Visits 23   Date for PT Re-Evaluation 03/18/15   Authorization Type UHC  (23 visit limit for PT)   Authorization Time Period 01/18/15 to 03/20/15   Authorization - Visit Number 23   Authorization - Number of Visits 23   PT Start Time 1194   PT Stop Time 1514   PT Time Calculation (min) 38 min   Activity Tolerance Patient tolerated treatment well   Behavior During Therapy [facility] for tasks assessed/performed      Past Medical History  Diagnosis Date  . COPD (chronic obstructive pulmonary disease)   . Herpes   . Hypertension     pt. doesn't see cardiologist, followed for HTN by Dr. Gerarda Fraction  . Shortness of breath dyspnea   . Anxiety     takes alprazolam - rare use   . GERD (gastroesophageal reflux disease)     pt. reports that its better, no meds in use at this time- 2015  . Arthritis     cervical spondylosis     Past Surgical History  Procedure Laterality Date  . Abdominal hysterectomy    . Rotator cuff repair Left   . Foreign body removal Left     knee-as child  . Carpal tunnel release Left 02/16/2013    Procedure: CARPAL TUNNEL RELEASE;  Surgeon: Carole Civil, MD;  Location: [facility];  Service: Orthopedics;  Laterality: Left;  . Carpal tunnel release Right 03/27/2013    Procedure: RIGHT CARPAL TUNNEL RELEASE;  Surgeon: Carole Civil, MD;  Location: [facility];  Service: Orthopedics;  Laterality: Right;  . Cesarean section      x2  . Anterior cervical decomp/discectomy fusion N/A 08/06/2014    Procedure: ANTERIOR CERVICAL DECOMPRESSION/DISCECTOMY FUSION CERVICAL  THREE-FOUR,CERVICAL SIX-SEVEN ,CERVICAL SEVEN-THORACIC ONE;  Surgeon: Floyce Stakes, MD;  Location: [facility];  Service: Neurosurgery;  Laterality: N/A;    There were no vitals filed for this visit.  Visit Diagnosis:  Shoulder weakness  Cervical pain  Neck muscle weakness  Decreased range of motion of shoulder, left  Left shoulder pain  Decreased range of motion of neck      Subjective Assessment - 03/15/15 1436    Subjective Patient reports that she is doing well today, neck is a little stiff today but otherwise OK    Pertinent History Problems with neck and back started about a year and a half ago; was laid off January 2015. Before she was laid off, she was doing the work of 3 positions with a lot of head turning, stress on her neck. Went to multiple MDs about pain.    Currently in Pain? Yes   Pain Score 3    Pain Location Other (Comment)  pain under jawbone on L             [facility] PT Assessment - 03/15/15 0001    AROM   Right Shoulder Flexion 157 Degrees  pain    Right Shoulder ABduction 142 Degrees  pain    Right Shoulder Internal Rotation 87 Degrees   Right Shoulder External Rotation 78 Degrees  Left Shoulder Flexion 116 Degrees   Left Shoulder ABduction 90 Degrees   Left Shoulder Internal Rotation 90 Degrees   Left Shoulder External Rotation 54 Degrees  pain limited    Cervical Flexion 65   Cervical Extension 39   Cervical - Right Side Bend 34   Cervical - Left Side Bend 30   Cervical - Right Rotation 43   Cervical - Left Rotation 57   Strength   Right Shoulder Flexion 4+/5   Right Shoulder ABduction 4/5   Right Shoulder Internal Rotation 4+/5   Right Shoulder External Rotation 3+/5   Left Shoulder Flexion 3/5  pain limited    Left Shoulder ABduction 2+/5  pain limited    Left Shoulder Internal Rotation 4/5   Left Shoulder External Rotation 3-/5   Cervical Flexion 3+/5  pain    Cervical Extension 3+/5   Cervical - Right Side Bend 4/5   Cervical -  Left Side Bend 4-/5   Cervical - Right Rotation 4-/5   Cervical - Left Rotation 4-/5                             PT Education - 03/15/15 1820    Education provided Yes   Education Details outcome of skilled PT services, DC today, importance of following up with MD  since PT is over, updated HEP    Person(s) Educated Patient   Methods Explanation   Comprehension Verbalized understanding          PT Short Term Goals - 03/15/15 1453    PT SHORT TERM GOAL #1   Title Patient will improve her cervical range of motion by at least 10 degrees on all planes to assist her in better performing functional tasks    Status Not Met   PT SHORT TERM GOAL #2   Title Patient will complain of no more than 4/10 pain in her neck during all functional tasks and activities involved in caring for her mother    Baseline 8/16- has had high enough levels of pain that she has had to wear brace on neck recently    Time 4   Period Days   Status Not Met   PT SHORT TERM GOAL #3   Title Patient will demonstrate improved bilateral shoulder  and bilateral shoulder girdle strength to at least 4/5 in order to improve regional stability and reduce pain    Time 4   Period Weeks   Status Not Met   PT SHORT TERM GOAL #4   Title Patient will be able to verbally state the importance of maintaining good posture in reducing her overall pain and will also be able to maintain good posture and cervical mechanics 90% of the time    Time 4   Period Weeks   Status Achieved   PT SHORT TERM GOAL #5   Title Patient will improve L shoulder ROM by at least 15 degrees all planes with no increase in pain    Time 4   Period Weeks   Status Not Met           PT Long Term Goals - 03/15/15 1455    PT LONG TERM GOAL #1   Title Patient will be independent in correctly and consistently performing appropriate HEP for both neck and shoulder  in order to assist in the management of her condition   Time 8   Period  Weeks   Status Achieved  PT LONG TERM GOAL #2   Title Patient will demonstrate at least 4/5 cervical strength on all planes of motion and will demonstrate at least 4+/5 in bilateral shoulder/shoulder girdle musculature    Time 8   Period Weeks   Status Not Met   PT LONG TERM GOAL #3   Title Patient will demonstrate the ability to easily perform cervical rotation either direction at least 70 degrees with pain no more than 1/10 in order to allow her to safely drive and perform functional tasks    Baseline 8/16- increased pain with cervical rotation today    Time 8   Period Weeks   Status Not Met   PT LONG TERM GOAL #4   Title Patient will be able to lie on her back with cervical pain at 0/10 in order to allow her to get a good night's sleep and reduce environmental stress; patient will also state that she is having 0/10 pain throughout functional activities during her day    Baseline 8/16- back to having pain when laying on back and L side   Time 8   Period Weeks   Status Not Met   PT LONG TERM GOAL #5   Title Patient will be able to independently wash her hair with both arms with pain no more than 1/10 and no residual soreness after task    Baseline 8/16- still doing task with one hand, still pain limited, cannot do her hair on her own anymore    Time 8   Period Weeks   Status Not Met   PT LONG TERM GOAL #6   Title Patient will be able to independently put on shirts and jackets with no pain and no ROM restrictions and no residual soreness after task    Baseline 8/16- patient still very limited in dressing due to pain and ROM limitations    Time 8   Period Weeks   Status Not Met               Plan - 03/15/15 1822    Clinical Impression Statement Discharge assessment completed today. Patient does not demonstrate any significant improvement in her cervical spine or shoulders, and continues to be limited by pain and reduced ROM and significant weakness today. Patient has been  adamant that she complete all skilled PT sessions available to her as she very much wanted to attempt to avoid surgery. Continues to be unable to perform many functional activities due to pain inher shoulder and neck.  At this time strongly recommend that patient follow up with MD regarding best plan of care moving forward, as skilled PT services have made minimal improvement in her pain and functional status.    Pt will benefit from skilled therapeutic intervention in order to improve on the following deficits Decreased coordination;Decreased range of motion;Impaired flexibility;Improper body mechanics;Postural dysfunction;Decreased activity tolerance;Decreased balance;Pain;Impaired UE functional use;Increased muscle spasms;Decreased mobility;Decreased strength;Impaired perceived functional ability   Rehab Potential Good   PT Treatment/Interventions ADLs/Self Care Home Management;Neuromuscular re-education;Biofeedback;Functional mobility training;Patient/family education;Cryotherapy;Therapeutic activities;Therapeutic exercise;Manual techniques;Energy conservation;Moist Heat;Balance training;Traction   PT Next Visit Plan DC today    PT Home Exercise Plan Continue with manual therapy PRN, isometric exercises for stabilization, progress strengthening as tolerated   Consulted and Agree with Plan of Care Patient        Problem List Patient Active Problem List   Diagnosis Date Noted  . Cervical stenosis of spinal canal 08/06/2014  . Muscle weakness (generalized) 11/12/2013  . Tight fascia  11/12/2013  . Decreased range of motion of shoulder 11/12/2013  . Cervical spondylosis without myelopathy 10/29/2013  . S/P carpal tunnel release 02/19/2013  . CTS (carpal tunnel syndrome) 02/19/2013  . SHOULDER PAIN 10/06/2007  . IMPINGEMENT SYNDROME 10/06/2007  . RUPTURE ROTATOR CUFF 10/06/2007  . HIGH BLOOD PRESSURE 10/03/2007    PHYSICAL THERAPY DISCHARGE SUMMARY  Visits from Start of Care:  23  Current functional level related to goals / functional outcomes: Patient has been very adamant that she complete all of her available skilled PT sessions in an attempt to avoid surgery. Continues to be significantly limited by pain, reduced ROM, weakness in L shoulder and by neck pain; unable to perform many functional tasks at this time.    Remaining deficits: Pain, cervical and shoulder reduced ROM, weakness in cervical spine and shoulders, reduced functional task performance due to pain   Education / Equipment: Updated HEP, strongly advised patient to follow up with MD regarding best plan of care moving forward following minimal improvement with skilled PT services  Plan: Patient agrees to discharge.  Patient goals were not met. Patient is being discharged due to lack of progress.  ?????       Deniece Ree PT, DPT [place] [address] [place], [place], [address] Phone: [phone]   Fax:  [phone]

## 2015-03-15 NOTE — Patient Instructions (Signed)
    SHOULDER PENDULUMS (CIRCLES, FORWARD/BACK, SIDE TO SIDE)  Shift your body weight side to side to allow your injured arm to swing freely. Your injured arm should be fully relaxed.  Repeat x30 each direction, 2-3 times per day.    DIAPHRAMATIC BREATHING  In the position that is most comfortable for you, place one hand on your breast bone and one hand on your abdomen near your navel.   Slowly take a deep breath in and focus on trying to get your hand on your stomach rise while the hand on your breast bone remains still.   As you breathe in, the hand on your stomach should rise.  When you breath out, the hand on your stomach should lower.  Repeat 30 times, 2-3 times per day.    SCAPULAR RETRACTIONS  Draw your shoulder blades back and down. It should feel like the muscles between your shoulder blades are pinching together.   Repeat 15 times, 2-3 times per day.    Shoulder Rolls  Sit with arm resting on a table (or bed). Start with arm circles, focusing on moving the shoulder while keeping the arm resting on the table. Do 20 backwards shoulder rolls.   Repeat 20 times, 2-3 times per day.     SHRUGS  Raise your shoulders upward towards your ears as shown. Shrug both shoulders at the same time while taking a deep breath in. Hold for 3-5 seconds, then let the breath out while relaxing all of the muscles in your shoulders and neck.  Repeat 10 times, 2-3 times per day.      Wall pushup  Stand adjacent to a wall.  Lean against the wall, with arms extended, so that your body is at a slight angle.  Bend arms until your nose is about 2 inches away from the wall, then press out into full extension until shoulders are rounded.  Repeat 10 times, 2 times per day.

## 2015-03-17 ENCOUNTER — Ambulatory Visit (HOSPITAL_COMMUNITY): Payer: 59 | Admitting: Physical Therapy

## 2015-03-22 IMAGING — MG MM DIGITAL SCREENING BILAT W/ CAD
4 series · 4 of 4 positions shown · non-contrast
Comparison: Previous exams.

CLINICAL DATA: Screening.

DIGITAL BILATERAL SCREENING MAMMOGRAM WITH CAD

[L CC]
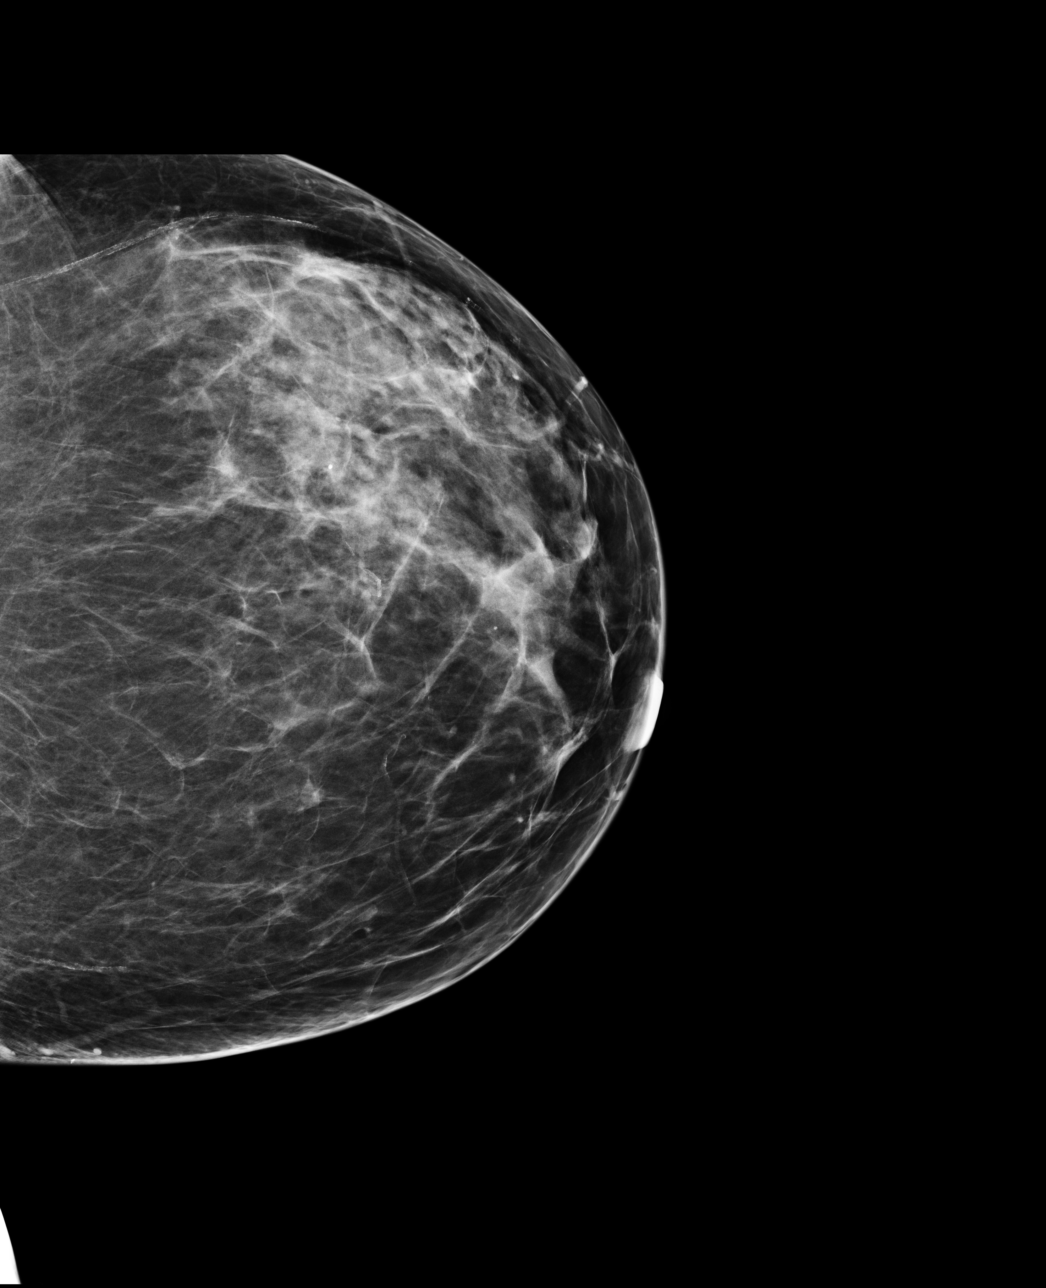

[L MLO]
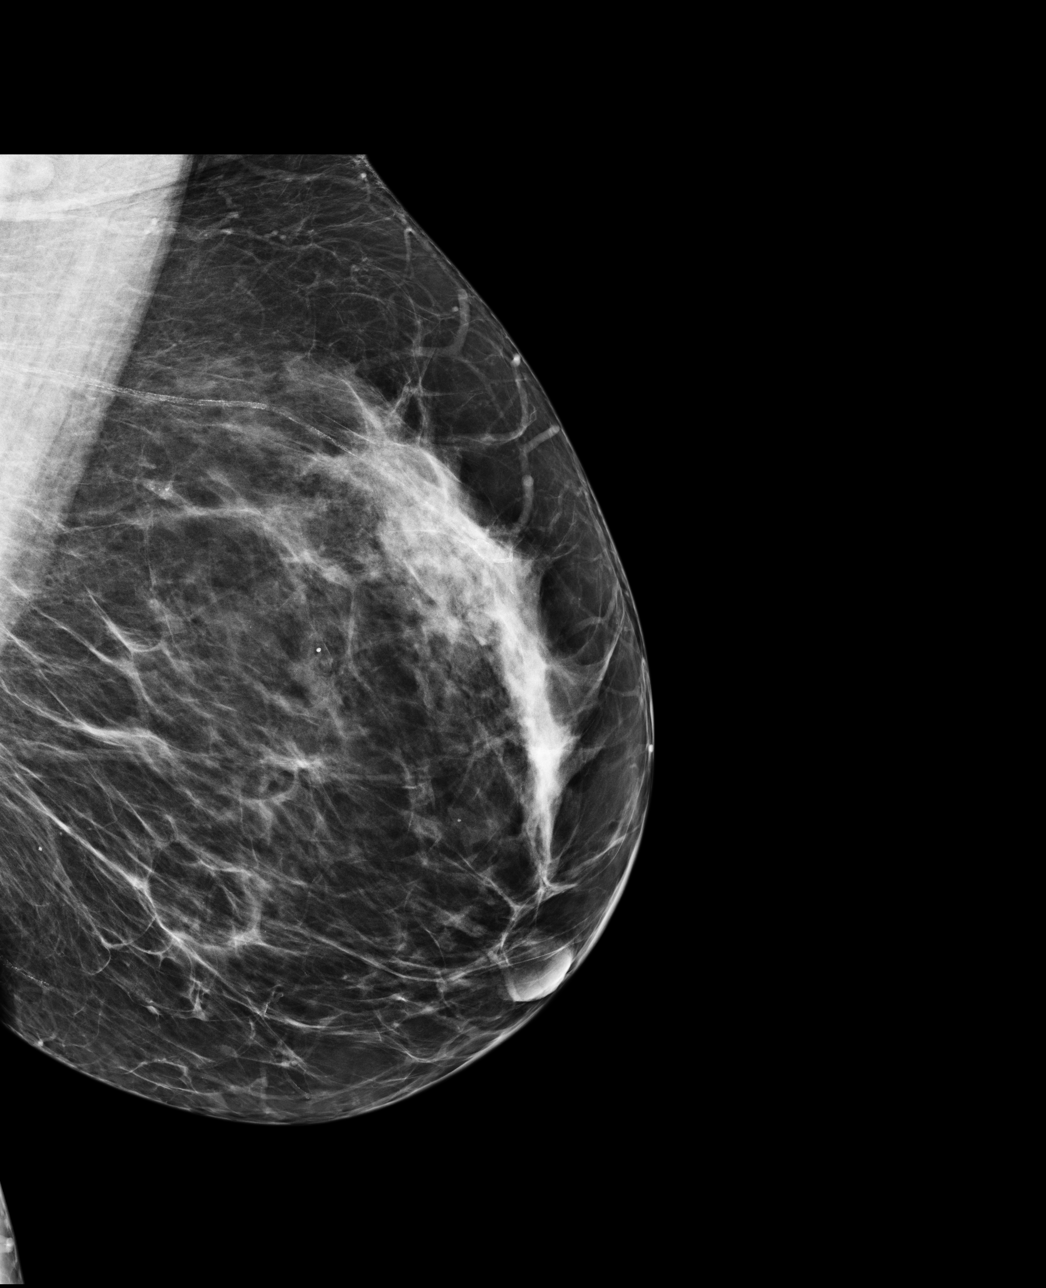

[R CC]
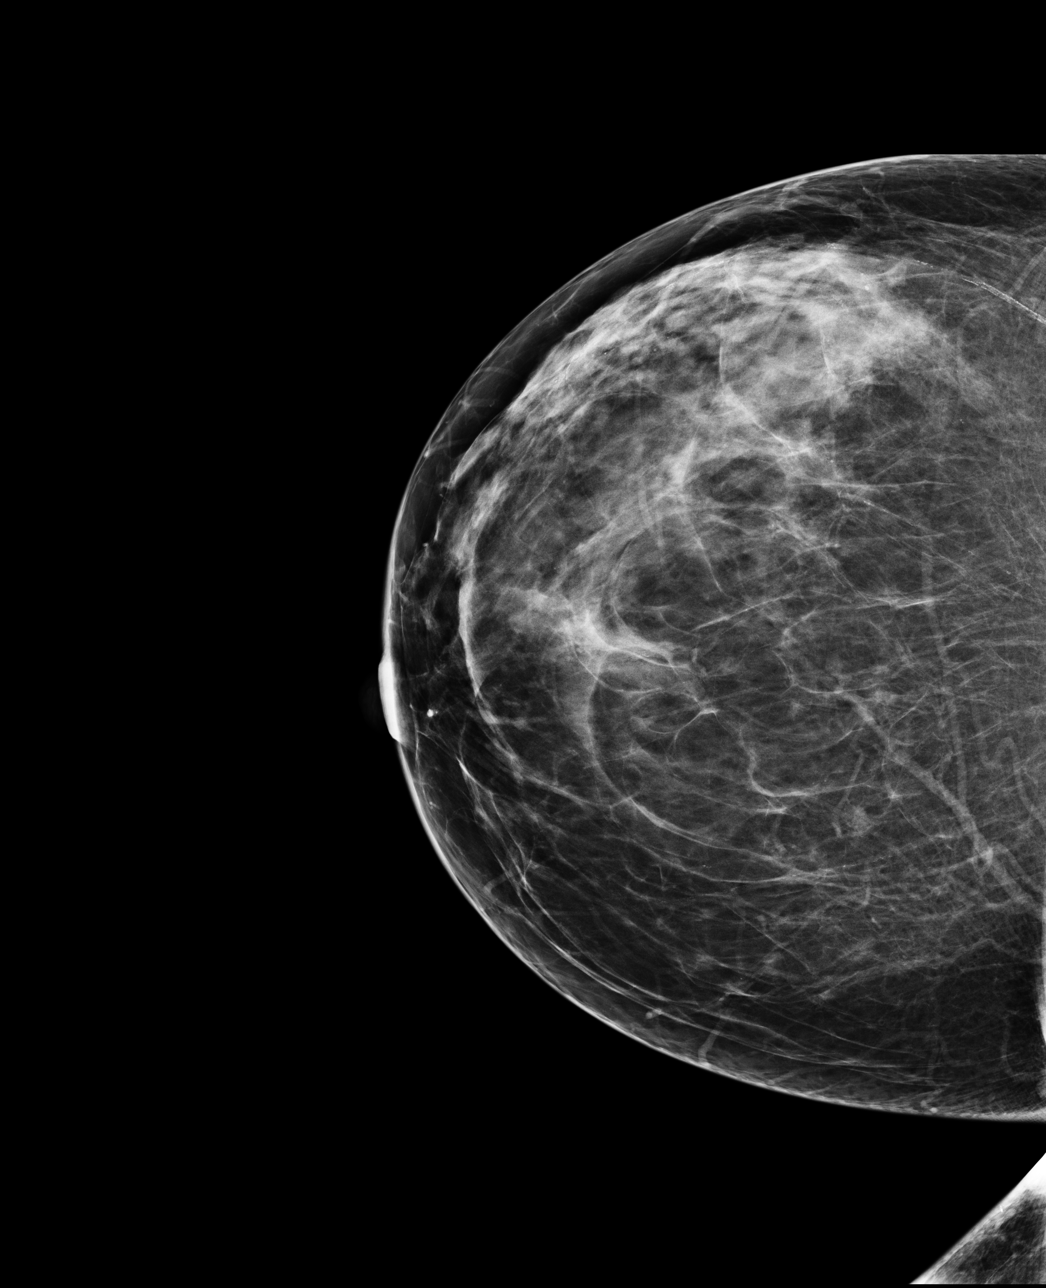

[R MLO]
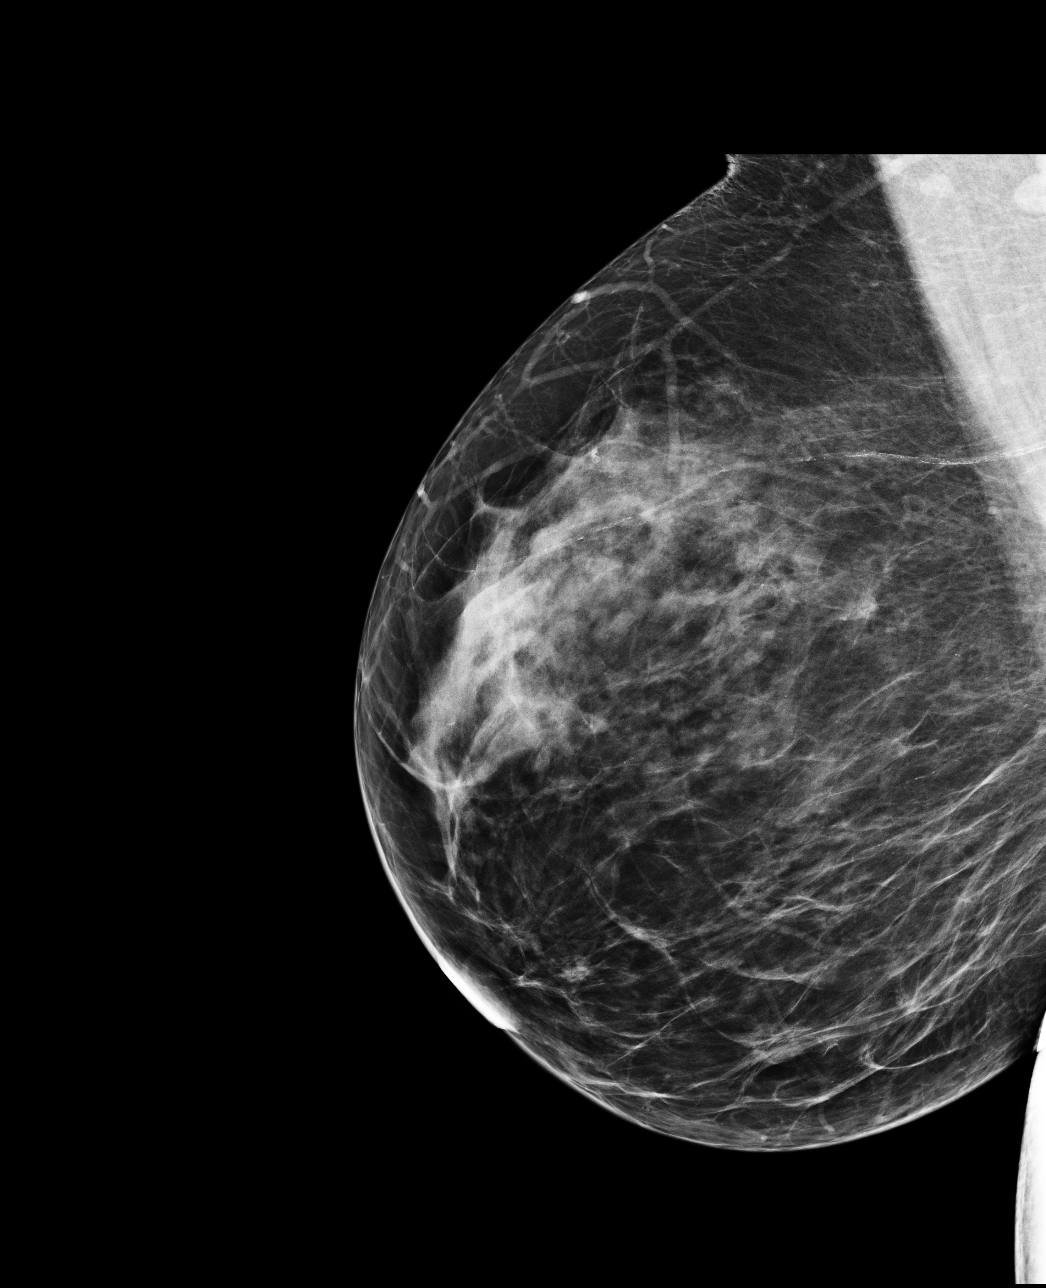

[4 of 4 positions shown; findings below may reference images not displayed]

FINDINGS: ACR Breast Density Category 3: The breast tissue is heterogeneously
dense.

No suspicious masses, architectural distortion, or calcifications
are present.

Images were processed with CAD.
IMPRESSION: No mammographic evidence of malignancy.

A result letter of this screening mammogram will be mailed directly
to the patient.

RECOMMENDATION:
Screening mammogram in one year. (Code:MR-U-MXF)

BI-RADS CATEGORY 2:  Benign finding(s).

## 2015-03-23 ENCOUNTER — Other Ambulatory Visit (HOSPITAL_COMMUNITY): Payer: Self-pay | Admitting: Otolaryngology

## 2015-04-07 ENCOUNTER — Encounter (HOSPITAL_COMMUNITY): Payer: Self-pay

## 2015-04-07 ENCOUNTER — Encounter (HOSPITAL_COMMUNITY)
Admission: RE | Admit: 2015-04-07 | Discharge: 2015-04-07 | Disposition: A | Discharge status: Home / Self Care | Payer: 59 | Source: Ambulatory Visit | Attending: Otolaryngology | Admitting: Otolaryngology

## 2015-04-07 DIAGNOSIS — Z01812 Encounter for preprocedural laboratory examination: Secondary | ICD-10-CM | POA: Insufficient documentation

## 2015-04-07 DIAGNOSIS — Z01810 Encounter for preprocedural cardiovascular examination: Secondary | ICD-10-CM | POA: Diagnosis present

## 2015-04-07 HISTORY — DX: Repeated falls: R29.6

## 2015-04-07 HISTORY — DX: Personal history of pneumonia (recurrent): Z87.01

## 2015-04-07 HISTORY — DX: Cerebral cysts: G93.0

## 2015-04-07 HISTORY — DX: Other specified postprocedural states: Z98.890

## 2015-04-07 HISTORY — DX: Frequency of micturition: R35.0

## 2015-04-07 HISTORY — DX: Other abnormalities of gait and mobility: R26.89

## 2015-04-07 HISTORY — DX: Primary osteoarthritis, unspecified shoulder: M19.019

## 2015-04-07 HISTORY — DX: Personal history of urinary calculi: Z87.442

## 2015-04-07 HISTORY — DX: Nausea with vomiting, unspecified: R11.2

## 2015-04-07 LAB — CBC
HCT: 41.3 % (ref 36.0–46.0)
Hemoglobin: 13.7 g/dL (ref 12.0–15.0)
MCH: 30.7 pg (ref 26.0–34.0)
MCHC: 33.2 g/dL (ref 30.0–36.0)
MCV: 92.6 fL (ref 78.0–100.0)
PLATELETS: 292 10*3/uL (ref 150–400)
RBC: 4.46 MIL/uL (ref 3.87–5.11)
RDW: 12.5 % (ref 11.5–15.5)
WBC: 11.6 10*3/uL — AB (ref 4.0–10.5)

## 2015-04-07 LAB — BASIC METABOLIC PANEL
Anion gap: 10 (ref 5–15)
BUN: 8 mg/dL (ref 6–20)
CO2: 25 mmol/L (ref 22–32)
CREATININE: 0.58 mg/dL (ref 0.44–1.00)
Calcium: 9.4 mg/dL (ref 8.9–10.3)
Chloride: 103 mmol/L (ref 101–111)
GFR calc Af Amer: >60 mL/min (ref >60)
GFR calc non Af Amer: >60 mL/min (ref >60)
Glucose, Bld: 90 mg/dL (ref 65–99)
Potassium: 4 mmol/L (ref 3.5–5.1)
SODIUM: 138 mmol/L (ref 135–145)

## 2015-04-07 NOTE — Pre-Procedure Instructions (Signed)
    Kimberly [name]  04/07/2015      [name], [place] [address] [name] [address] Phone: [phone] Fax: [phone]    Your procedure is scheduled on Wednesday, September 14th, 2016.   Report to [facility] Admitting at 6:30 A.M.  Call this number if you have problems the morning of surgery:  [phone]   Remember:  Do not eat food or drink liquids after midnight.   Take these medicines the morning of surgery with A SIP OF WATER: Acyclovir (Zovirax), Amlodipine (Norvasc), Budesonide-Formoterol (Symbicort) inhaler, Diazepam (Valium), Gabapentin (Neurontin), Methocarbamol (Robaxin), Oxycodone-Acetaminophen (Percocet) if needed, Pregabalin (Lyrica).   Stop taking: NSAIDS, Aspirin, Diclofenac (Voltaren), Ibuprofen, Naproxen, Aleve, BC's, Goody's, Fish Oil, all herbal medications, and all vitamins.    Do not wear jewelry, make-up or nail polish.  Do not wear lotions, powders, or perfumes.  You may wear deodorant.  Do not shave 48 hours prior to surgery.    Do not bring valuables to the hospital.  [facility] is not responsible for any belongings or valuables.  Contacts, dentures or bridgework may not be worn into surgery.  Leave your suitcase in the car.  After surgery it may be brought to your room.  For patients admitted to the hospital, discharge time will be determined by your treatment team.  Patients discharged the day of surgery will not be allowed to drive home.   Special instructions:  See attached.   Please read over the following fact sheets that you were given. Pain Booklet, Coughing and Deep Breathing and Surgical Site Infection Prevention

## 2015-04-07 NOTE — Progress Notes (Signed)
PCP - Dr. Gerarda Fraction Cardiologist - denies  EKG- 04/07/15- Epic  CXR - denies Echo/Stress Test/Cardiac Cath - denies  Patient denies shortness of breath and chest pain at PAT appointment.

## 2015-04-09 NOTE — H&P (Signed)
04/13/2015 7:23 AM  Kimberly [name]  PREOPERATIVE HISTORY AND PHYSICAL  CHIEF COMPLAINT: adenoid cyst/Thornwadlt's cyst  HISTORY: This is a 54 year old who presented with dysphagia due to cervical/thoracic spinal hardware but was also noted to have an adenoid/Thornwaldt's cyst on nasopharyngolaryngoscopy. She now presents for nasal endoscopy and removal of adenoid cyst.  Dr. Simeon Craft, [place] has discussed the risks (bleeding, infection, anesthesia risks), benefits, and alternatives of this procedure. The patient understands the risks and would like to proceed with the procedure. The chances of success of the procedure are >75% and the patient understands this. I personally performed an examination of the patient within 24 hours of the procedure.  PAST MEDICAL HISTORY: Past Medical History  Diagnosis Date  . COPD (chronic obstructive pulmonary disease)   . Herpes   . Hypertension     pt. doesn't see cardiologist, followed for HTN by Dr. Gerarda Fraction  . Shortness of breath dyspnea   . Anxiety     takes alprazolam - rare use   . GERD (gastroesophageal reflux disease)     pt. reports that its better, no meds in use at this time- 2015  . Arthritis     cervical spondylosis   . PONV (postoperative nausea and vomiting)   . History of pneumonia   . Chronic bronchitis with COPD (chronic obstructive pulmonary disease)   . Brain cyst   . Recurrent falls   . Balance problem   . Urinary frequency   . History of kidney stones   . Inflammation of shoulder joint     PAST SURGICAL HISTORY: Past Surgical History  Procedure Laterality Date  . Abdominal hysterectomy    . Rotator cuff repair Left   . Foreign body removal Left     knee-as child  . Carpal tunnel release Left 02/16/2013    Procedure: CARPAL TUNNEL RELEASE;  Surgeon: Carole Civil, MD;  Location: [facility];  Service: Orthopedics;  Laterality: Left;  . Carpal tunnel release Right 03/27/2013    Procedure: RIGHT CARPAL TUNNEL RELEASE;   Surgeon: Carole Civil, MD;  Location: [facility];  Service: Orthopedics;  Laterality: Right;  . Cesarean section      x2  . Anterior cervical decomp/discectomy fusion N/A 08/06/2014    Procedure: ANTERIOR CERVICAL DECOMPRESSION/DISCECTOMY FUSION CERVICAL THREE-FOUR,CERVICAL SIX-SEVEN ,CERVICAL SEVEN-THORACIC ONE;  Surgeon: Floyce Stakes, MD;  Location: [facility];  Service: Neurosurgery;  Laterality: N/A;  . Colonoscopy      MEDICATIONS: No current facility-administered medications on file prior to encounter.   Current Outpatient Prescriptions on File Prior to Encounter  Medication Sig Dispense Refill  . acyclovir (ZOVIRAX) 400 MG tablet Take 400 mg by mouth daily.     Marland Kitchen amLODipine (NORVASC) 5 MG tablet Take 5 mg by mouth daily with breakfast.     . diazepam (VALIUM) 5 MG tablet Take 5 mg by mouth every 6 (six) hours as needed for anxiety.    Marland Kitchen estradiol (ESTRACE) 1 MG tablet Take 1 mg by mouth daily with breakfast.     . oxyCODONE-acetaminophen (PERCOCET/ROXICET) 5-325 MG per tablet Take 1 tablet by mouth every 4 (four) hours as needed. (Patient taking differently: Take 1 tablet by mouth every 8 (eight) hours as needed for moderate pain. ) 15 tablet 0  . diclofenac (VOLTAREN) 50 MG EC tablet Take 1 tablet (50 mg total) by mouth 2 (two) times daily. 15 tablet 0  . gabapentin (NEURONTIN) 100 MG capsule TWO CAPSULES BY MOUTH EVERY 8 HOURS 90 capsule 5  .  gabapentin (NEURONTIN) 300 MG capsule Take 300 mg by mouth 3 (three) times daily.    . methocarbamol (ROBAXIN) 500 MG tablet Take 1 tablet (500 mg total) by mouth 2 (two) times daily. 60 tablet 3    ALLERGIES: Allergies  Allergen Reactions  . Peanut-Containing Drug Products Anaphylaxis and Hives  . Shellfish Allergy Anaphylaxis and Hives  . Codeine Other (See Comments)    jitters    SOCIAL HISTORY: Social History   Social History  . Marital Status: Married    Spouse Name: N/A  . Number of Children: N/A  . Years of Education: N/A    Occupational History  . Not on file.   Social History Main Topics  . Smoking status: Never Smoker   . Smokeless tobacco: Not on file  . Alcohol Use: No  . Drug Use: No  . Sexual Activity: Yes    Birth Control/ Protection: Surgical   Other Topics Concern  . Not on file   Social History Narrative    FAMILY HISTORY: No family history on file.  REVIEW OF SYSTEMS:  HEENT:dysphagia, otherwise negative x 12 systems except per HPI  PHYSICAL EXAM:  GENERAL:  NAD VITAL SIGNS:   Filed Vitals:   04/13/15 0637  BP: 139/103  Pulse: 74  Temp: 97.8 F (36.6 C)  Resp: 18    SKIN:  Warm, dry HEENT:  oral cavity clear NECK:  Well-healed neck scar from cervical spine surgery LYMPH:  No LAD ABDOMEN:  soft MUSCULOSKELETAL: normal strength PSYCH:  Normal affect NEUROLOGIC:  CN 2-12 intact and symmetric  DIAGNOSTIC STUDIES: swallow study shows dysphagia with large barium tablet and cervical esophageal dysphagia due to C6-T1 cervical spine hardware  ASSESSMENT AND PLAN: Plan to proceed with nasal endoscopy with removal of Thornwaldt's/adenoid cyst. Patient understands the risks, benefits, and alternatives.  04/13/2015  7:24 AM Kimberly [name]

## 2015-04-13 ENCOUNTER — Ambulatory Visit (HOSPITAL_COMMUNITY)
Admission: RE | Admit: 2015-04-13 | Discharge: 2015-04-13 | Disposition: A | Discharge status: Home / Self Care | Payer: 59 | Source: Ambulatory Visit | Attending: Otolaryngology | Admitting: Otolaryngology

## 2015-04-13 ENCOUNTER — Encounter (HOSPITAL_COMMUNITY): Payer: Self-pay | Admitting: *Deleted

## 2015-04-13 ENCOUNTER — Ambulatory Visit (HOSPITAL_COMMUNITY): Payer: 59 | Admitting: Anesthesiology

## 2015-04-13 ENCOUNTER — Encounter (HOSPITAL_COMMUNITY)
Admission: RE | Disposition: A | Discharge status: Home / Self Care | Payer: Self-pay | Source: Ambulatory Visit | Attending: Otolaryngology

## 2015-04-13 DIAGNOSIS — J358 Other chronic diseases of tonsils and adenoids: Secondary | ICD-10-CM | POA: Diagnosis present

## 2015-04-13 DIAGNOSIS — I10 Essential (primary) hypertension: Secondary | ICD-10-CM | POA: Insufficient documentation

## 2015-04-13 DIAGNOSIS — J392 Other diseases of pharynx: Secondary | ICD-10-CM | POA: Insufficient documentation

## 2015-04-13 DIAGNOSIS — J449 Chronic obstructive pulmonary disease, unspecified: Secondary | ICD-10-CM | POA: Diagnosis not present

## 2015-04-13 HISTORY — PX: NASAL SINUS SURGERY: SHX719

## 2015-04-13 SURGERY — SINUS SURGERY, ENDOSCOPIC
Anesthesia: General | Site: Nose

## 2015-04-13 MED ORDER — FENTANYL CITRATE (PF) 250 MCG/5ML IJ SOLN
INTRAMUSCULAR | Status: AC
  Filled 2015-04-13: qty 5

## 2015-04-13 MED ORDER — FENTANYL CITRATE (PF) 100 MCG/2ML IJ SOLN
25.0000 ug | INTRAMUSCULAR | Status: DC | PRN
  Administered 2015-04-13: 50 ug via INTRAVENOUS
  Administered 2015-04-13: 25 ug via INTRAVENOUS

## 2015-04-13 MED ORDER — CLINDAMYCIN PHOSPHATE 600 MG/50ML IV SOLN
INTRAVENOUS | Status: AC
  Administered 2015-04-13: 600 mg via INTRAVENOUS
  Filled 2015-04-13: qty 50

## 2015-04-13 MED ORDER — SCOPOLAMINE 1 MG/3DAYS TD PT72
MEDICATED_PATCH | TRANSDERMAL | Status: AC
  Administered 2015-04-13: 1 via TRANSDERMAL
  Filled 2015-04-13: qty 1

## 2015-04-13 MED ORDER — LACTATED RINGERS IV SOLN
INTRAVENOUS | Status: DC | PRN
  Administered 2015-04-13: 08:00:00 via INTRAVENOUS

## 2015-04-13 MED ORDER — PROMETHAZINE HCL 25 MG/ML IJ SOLN: 6.2500 mg | INTRAMUSCULAR | Status: DC | PRN

## 2015-04-13 MED ORDER — FENTANYL CITRATE (PF) 100 MCG/2ML IJ SOLN
INTRAMUSCULAR | Status: AC
  Administered 2015-04-13: 50 ug via INTRAVENOUS
  Filled 2015-04-13: qty 2

## 2015-04-13 MED ORDER — MUPIROCIN 2 % EX OINT
TOPICAL_OINTMENT | CUTANEOUS | Status: AC
  Filled 2015-04-13: qty 22

## 2015-04-13 MED ORDER — SUCCINYLCHOLINE CHLORIDE 20 MG/ML IJ SOLN
INTRAMUSCULAR | Status: DC | PRN
  Administered 2015-04-13: 120 mg via INTRAVENOUS

## 2015-04-13 MED ORDER — OXYCODONE HCL 5 MG/5ML PO SOLN: 5.0000 mg | Freq: Once | ORAL | Status: DC | PRN

## 2015-04-13 MED ORDER — CLINDAMYCIN PHOSPHATE 600 MG/50ML IV SOLN: 600.0000 mg | Freq: Once | INTRAVENOUS | Status: DC

## 2015-04-13 MED ORDER — NEOSTIGMINE METHYLSULFATE 10 MG/10ML IV SOLN
INTRAVENOUS | Status: DC | PRN
  Administered 2015-04-13: 1 mg via INTRAVENOUS

## 2015-04-13 MED ORDER — MIDAZOLAM HCL 2 MG/2ML IJ SOLN
INTRAMUSCULAR | Status: AC
  Filled 2015-04-13: qty 4

## 2015-04-13 MED ORDER — GLYCOPYRROLATE 0.2 MG/ML IJ SOLN
INTRAMUSCULAR | Status: DC | PRN
  Administered 2015-04-13: .2 mg via INTRAVENOUS

## 2015-04-13 MED ORDER — FENTANYL CITRATE (PF) 100 MCG/2ML IJ SOLN
INTRAMUSCULAR | Status: DC | PRN
  Administered 2015-04-13: 50 ug via INTRAVENOUS
  Administered 2015-04-13: 100 ug via INTRAVENOUS

## 2015-04-13 MED ORDER — ROCURONIUM BROMIDE 100 MG/10ML IV SOLN
INTRAVENOUS | Status: DC | PRN
  Administered 2015-04-13: 10 mg via INTRAVENOUS

## 2015-04-13 MED ORDER — OXYCODONE HCL 5 MG PO TABS: 5.0000 mg | ORAL_TABLET | Freq: Once | ORAL | Status: DC | PRN

## 2015-04-13 MED ORDER — LIDOCAINE-EPINEPHRINE 1 %-1:100000 IJ SOLN
INTRAMUSCULAR | Status: AC
  Filled 2015-04-13: qty 1

## 2015-04-13 MED ORDER — 0.9 % SODIUM CHLORIDE (POUR BTL) OPTIME
TOPICAL | Status: DC | PRN
  Administered 2015-04-13: 1000 mL

## 2015-04-13 MED ORDER — MIDAZOLAM HCL 5 MG/5ML IJ SOLN
INTRAMUSCULAR | Status: DC | PRN
  Administered 2015-04-13: 2 mg via INTRAVENOUS

## 2015-04-13 MED ORDER — PROPOFOL 10 MG/ML IV BOLUS
INTRAVENOUS | Status: DC | PRN
  Administered 2015-04-13: 100 mg via INTRAVENOUS
  Administered 2015-04-13: 200 mg via INTRAVENOUS

## 2015-04-13 MED ORDER — OXYMETAZOLINE HCL 0.05 % NA SOLN
NASAL | Status: AC
  Filled 2015-04-13: qty 15

## 2015-04-13 MED ORDER — DEXAMETHASONE SODIUM PHOSPHATE 10 MG/ML IJ SOLN
INTRAMUSCULAR | Status: DC | PRN
  Administered 2015-04-13: 10 mg via INTRAVENOUS

## 2015-04-13 MED ORDER — ONDANSETRON HCL 4 MG/2ML IJ SOLN
INTRAMUSCULAR | Status: DC | PRN
  Administered 2015-04-13: 4 mg via INTRAVENOUS

## 2015-04-13 MED ORDER — PROPOFOL 10 MG/ML IV BOLUS
INTRAVENOUS | Status: AC
  Filled 2015-04-13: qty 20

## 2015-04-13 MED ORDER — OXYMETAZOLINE HCL 0.05 % NA SOLN
NASAL | Status: DC | PRN
  Administered 2015-04-13: 1

## 2015-04-13 SURGICAL SUPPLY — 59 items
APL SRG 60D 8 XTD TIP BNDBL (TIP)
ATTRACTOMAT 16X20 MAGNETIC DRP (DRAPES) IMPLANT
BLADE RAD40 ROTATE 4M 4 5PK (BLADE) IMPLANT
BLADE RAD40 ROTATE 4M 4MM 5PK (BLADE)
BLADE RAD60 ROTATE M4 4 5PK (BLADE) IMPLANT
BLADE RAD60 ROTATE M4 4MM 5PK (BLADE)
BLADE SURG 15 STRL LF DISP TIS (BLADE) IMPLANT
BLADE SURG 15 STRL SS (BLADE)
BLADE TRICUT ROTATE M4 4 5PK (BLADE) ×2 IMPLANT
BLADE TRICUT ROTATE M4 4MM 5PK (BLADE) ×1
CANISTER SUCTION 2500CC (MISCELLANEOUS) ×3 IMPLANT
CLOSURE WOUND 1/2 X4 (GAUZE/BANDAGES/DRESSINGS) ×1
COAGULATOR SUCT 6 FR SWTCH (ELECTROSURGICAL)
COAGULATOR SUCT SWTCH 10FR 6 (ELECTROSURGICAL) IMPLANT
CORDS BIPOLAR (ELECTRODE) IMPLANT
CRADLE DONUT ADULT HEAD (MISCELLANEOUS) IMPLANT
DECANTER SPIKE VIAL GLASS SM (MISCELLANEOUS) ×3 IMPLANT
DRAPE PROXIMA HALF (DRAPES) IMPLANT
DRESSING MEROCEL 8CM (GAUZE/BANDAGES/DRESSINGS) IMPLANT
DRESSING NASAL POPE 10X1.5X2.5 (GAUZE/BANDAGES/DRESSINGS) IMPLANT
DRESSING TELFA 8X10 (GAUZE/BANDAGES/DRESSINGS) IMPLANT
DRSG MEROCEL 8CM (GAUZE/BANDAGES/DRESSINGS)
DRSG NASAL POPE 10X1.5X2.5 (GAUZE/BANDAGES/DRESSINGS)
DURASEAL APPLICATOR TIP (TIP) IMPLANT
DURASEAL SPINE SEALANT 3ML (MISCELLANEOUS) IMPLANT
ELECT REM PT RETURN 9FT ADLT (ELECTROSURGICAL)
ELECTRODE REM PT RTRN 9FT ADLT (ELECTROSURGICAL) IMPLANT
FILTER ARTHROSCOPY CONVERTOR (FILTER) ×3 IMPLANT
FLOSEAL 10ML (HEMOSTASIS) IMPLANT
GLOVE BIOGEL PI IND STRL 7.5 (GLOVE) IMPLANT
GLOVE BIOGEL PI INDICATOR 7.5 (GLOVE) ×2
GLOVE SURG SS PI 7.5 STRL IVOR (GLOVE) ×5 IMPLANT
GOWN STRL REUS W/ TWL LRG LVL3 (GOWN DISPOSABLE) ×2 IMPLANT
GOWN STRL REUS W/TWL LRG LVL3 (GOWN DISPOSABLE) ×6
KIT BASIN OR (CUSTOM PROCEDURE TRAY) ×3 IMPLANT
KIT ROOM TURNOVER OR (KITS) ×3 IMPLANT
NDL HYPO 25GX1X1/2 BEV (NEEDLE) IMPLANT
NDL SPNL 25GX3.5 QUINCKE BL (NEEDLE) IMPLANT
NEEDLE HYPO 25GX1X1/2 BEV (NEEDLE) IMPLANT
NEEDLE SPNL 25GX3.5 QUINCKE BL (NEEDLE) IMPLANT
NS IRRIG 1000ML POUR BTL (IV SOLUTION) ×3 IMPLANT
PAD ARMBOARD 7.5X6 YLW CONV (MISCELLANEOUS) ×6 IMPLANT
PATTIES SURGICAL .5 X3 (DISPOSABLE) ×3 IMPLANT
SHEATH ENDOSCRUB 0 DEG (SHEATH) IMPLANT
SHEATH ENDOSCRUB 30 DEG (SHEATH) IMPLANT
SHEATH ENDOSCRUB 45 DEG (SHEATH) IMPLANT
SOLUTION ANTI FOG 6CC (MISCELLANEOUS) ×3 IMPLANT
SPECIMEN JAR SMALL (MISCELLANEOUS) ×6 IMPLANT
STRIP CLOSURE SKIN 1/2X4 (GAUZE/BANDAGES/DRESSINGS) ×2 IMPLANT
SUT ETHILON 3 0 PS 1 (SUTURE) IMPLANT
SWAB COLLECTION DEVICE MRSA (MISCELLANEOUS) IMPLANT
SYR 50ML SLIP (SYRINGE) IMPLANT
SYRINGE 10CC LL (SYRINGE) ×3 IMPLANT
TOWEL OR 17X24 6PK STRL BLUE (TOWEL DISPOSABLE) ×3 IMPLANT
TRAY ENT MC OR (CUSTOM PROCEDURE TRAY) ×3 IMPLANT
TUBE CONNECTING 12'X1/4 (SUCTIONS) ×1
TUBE CONNECTING 12X1/4 (SUCTIONS) ×2 IMPLANT
WATER STERILE IRR 1000ML POUR (IV SOLUTION) ×3 IMPLANT
WIPE INSTRUMENT VISIWIPE 73X73 (MISCELLANEOUS) ×3 IMPLANT

## 2015-04-13 NOTE — Op Note (Signed)
DATE OF OPERATION: 04/13/2015 Surgeon: Ruby Cola Procedure Performed:  endoscopic removal/biopsy of adenoid/Thornwadlt's cyst 61443  PREOPERATIVE DIAGNOSIS: adenoid/Thornwadlt's cyst POSTOPERATIVE DIAGNOSIS: adenoid/Thornwadlt's cyst/tonsillith  SURGEON: Ruby Cola ANESTHESIA: General endotracheal using glide scope.  ESTIMATED BLOOD LOSS: minimal  DRAINS: Dnone SPECIMENS: adenoid/Thornwadlt's cyst INDICATIONS: The patient is a 54yo with a history of adenoid/Thornwadlt's cyst DESCRIPTION OF OPERATION: The patient was brought to the operating room and was placed in the supine position and was placed under general endotracheal anesthesia by anesthesiology. There was a large tonsillith dislodged during intubation that I removed using the headlight and the suction. The patient's nose was inspected and the nose was decongested with Afrin-coated pledgets which were then removed. The patient was prepped and draped in the usual sterile fashion. After the Afrin pledgets were removed, the inferior turbinates were outfractured.   I used the 0 degree scope to identify an ~ 0.5cm midline adenoid pad Thornwaldt's cyst and I removed the cyst using the scope and the takahashi forceps. The mass was passed off for pathology and then hemostasis was obtained with the afrin pledgets. Other than a leftward septal deviation no other nasal findings were seen.  Once hemostasis was noted the nose and stomach were suctioned out and the patient was turned back to anesthesia and awakened from anesthesia and extubated without difficulty. The patient tolerated the procedure well with no immediate complications and was taken to the postoperative recovery area in good condition.   Dr. Ruby Cola was present and performed the entire procedure. 04/13/2015  9:16 AM Ruby Cola

## 2015-04-13 NOTE — Transfer of Care (Signed)
Immediate Anesthesia Transfer of Care Note  Patient: Kimberly [name]  Procedure(s) Performed: Procedure(s): nasal endoscopy with adenoid biopsy (N/A)  Patient Location: PACU  Anesthesia Type:General  Level of Consciousness: awake, alert  and oriented  Airway & Oxygen Therapy: Patient Spontanous Breathing and Patient connected to nasal cannula oxygen  Post-op Assessment: Report given to RN, Post -op Vital signs reviewed and stable and Patient moving all extremities X 4  Post vital signs: Reviewed and stable  Last Vitals:  Filed Vitals:   04/13/15 0637  BP: 139/103  Pulse: 74  Temp: 36.6 C  Resp: 18    Complications: No apparent anesthesia complications

## 2015-04-13 NOTE — Anesthesia Postprocedure Evaluation (Signed)
  Anesthesia Post-op Note  Patient: Kimberly [name]  Procedure(s) Performed: Procedure(s) (LRB): nasal endoscopy with adenoid biopsy (N/A)  Patient Location: PACU  Anesthesia Type: General  Level of Consciousness: awake and alert   Airway and Oxygen Therapy: Patient Spontanous Breathing  Post-op Pain: mild  Post-op Assessment: Post-op Vital signs reviewed, Patient's Cardiovascular Status Stable, Respiratory Function Stable, Patent Airway and No signs of Nausea or vomiting  Last Vitals:  Filed Vitals:   04/13/15 0936  BP: 135/95  Pulse: 96  Temp:   Resp: 18    Post-op Vital Signs: stable   Complications: No apparent anesthesia complications

## 2015-04-13 NOTE — Anesthesia Preprocedure Evaluation (Addendum)
Anesthesia Evaluation  Patient identified by MRN, date of birth, ID band Patient awake    Reviewed: Allergy & Precautions, H&P , NPO status , Patient's Chart, lab work & pertinent test results  History of Anesthesia Complications (+) PONV and history of anesthetic complications  Airway Mallampati: I  TM Distance: >3 FB Neck ROM: Limited    Dental  (+) Teeth Intact, Dental Advisory Given   Pulmonary COPD,    Pulmonary exam normal        Cardiovascular hypertension, Pt. on medications Normal cardiovascular exam     Neuro/Psych  Neuromuscular disease    GI/Hepatic GERD  ,  Endo/Other    Renal/GU Renal disease     Musculoskeletal  (+) Arthritis ,   Abdominal   Peds  Hematology   Anesthesia Other Findings Herpes  Reproductive/Obstetrics                         Anesthesia Physical  Anesthesia Plan  ASA: II  Anesthesia Plan: General   Post-op Pain Management:    Induction: Intravenous  Airway Management Planned: Oral ETT  Additional Equipment:   Intra-op Plan:   Post-operative Plan: Extubation in OR  Informed Consent: I have reviewed the patients History and Physical, chart, labs and discussed the procedure including the risks, benefits and alternatives for the proposed anesthesia with the patient or authorized representative who has indicated his/her understanding and acceptance.   Dental Advisory Given  Plan Discussed with: CRNA, Anesthesiologist and Surgeon  Anesthesia Plan Comments: (Multi therapy PONV prophylaxis, propofol background infusion, GA with oral ETT Previous challenging airway, have glidescope and bougie available)      Anesthesia Quick Evaluation

## 2015-04-13 NOTE — Progress Notes (Signed)
Report to Drucie Opitz as primary.

## 2015-04-13 NOTE — Anesthesia Procedure Notes (Signed)
Procedure Name: Intubation Date/Time: 04/13/2015 8:30 AM Performed by: Neldon Newport Pre-anesthesia Checklist: Patient being monitored, Suction available, Emergency Drugs available, Patient identified and Timeout performed Patient Re-evaluated:Patient Re-evaluated prior to inductionOxygen Delivery Method: Circle system utilized Preoxygenation: Pre-oxygenation with 100% oxygen Intubation Type: IV induction and Rapid sequence Ventilation: Mask ventilation without difficulty Laryngoscope Size: Mac, 3 and Glidescope Grade View: Grade IV Tube type: Oral Tube size: 7.0 mm Number of attempts: 3 Airway Equipment and Method: Bougie stylet and Stylet Placement Confirmation: positive ETCO2,  ETT inserted through vocal cords under direct vision and breath sounds checked- equal and bilateral Secured at: 21 cm Tube secured with: Tape Difficulty Due To: Difficulty was unanticipated Future Recommendations: Recommend- induction with short-acting agent, and alternative techniques readily available

## 2015-04-13 NOTE — Discharge Instructions (Signed)
Follow up in about 2 weeks, Dr. Simeon Craft will call you with your pathology results. Regular diet, up ad lib. If you have nasal or oral bleeding can use half-strength peroxide gargle and spit and/or afrin TID as needed for less than 5 days.

## 2015-04-14 ENCOUNTER — Encounter (HOSPITAL_COMMUNITY): Payer: Self-pay | Admitting: Otolaryngology

## 2015-04-19 ENCOUNTER — Emergency Department (HOSPITAL_COMMUNITY): Payer: 59

## 2015-04-19 ENCOUNTER — Emergency Department (HOSPITAL_COMMUNITY)
Admission: EM | Admit: 2015-04-19 | Discharge: 2015-04-19 | Disposition: A | Discharge status: Home / Self Care | Payer: 59 | Attending: Emergency Medicine | Admitting: Emergency Medicine

## 2015-04-19 ENCOUNTER — Encounter (HOSPITAL_COMMUNITY): Payer: Self-pay | Admitting: Emergency Medicine

## 2015-04-19 DIAGNOSIS — Z792 Long term (current) use of antibiotics: Secondary | ICD-10-CM | POA: Diagnosis not present

## 2015-04-19 DIAGNOSIS — Z7982 Long term (current) use of aspirin: Secondary | ICD-10-CM | POA: Insufficient documentation

## 2015-04-19 DIAGNOSIS — M1990 Unspecified osteoarthritis, unspecified site: Secondary | ICD-10-CM | POA: Diagnosis not present

## 2015-04-19 DIAGNOSIS — I10 Essential (primary) hypertension: Secondary | ICD-10-CM | POA: Diagnosis not present

## 2015-04-19 DIAGNOSIS — R079 Chest pain, unspecified: Secondary | ICD-10-CM | POA: Diagnosis present

## 2015-04-19 DIAGNOSIS — J449 Chronic obstructive pulmonary disease, unspecified: Secondary | ICD-10-CM | POA: Diagnosis not present

## 2015-04-19 DIAGNOSIS — Z8701 Personal history of pneumonia (recurrent): Secondary | ICD-10-CM | POA: Insufficient documentation

## 2015-04-19 DIAGNOSIS — K8020 Calculus of gallbladder without cholecystitis without obstruction: Secondary | ICD-10-CM | POA: Diagnosis not present

## 2015-04-19 DIAGNOSIS — Z87442 Personal history of urinary calculi: Secondary | ICD-10-CM | POA: Diagnosis not present

## 2015-04-19 DIAGNOSIS — Z79899 Other long term (current) drug therapy: Secondary | ICD-10-CM | POA: Insufficient documentation

## 2015-04-19 DIAGNOSIS — Z8619 Personal history of other infectious and parasitic diseases: Secondary | ICD-10-CM | POA: Diagnosis not present

## 2015-04-19 DIAGNOSIS — R1110 Vomiting, unspecified: Secondary | ICD-10-CM

## 2015-04-19 DIAGNOSIS — F419 Anxiety disorder, unspecified: Secondary | ICD-10-CM | POA: Insufficient documentation

## 2015-04-19 DIAGNOSIS — Z9181 History of falling: Secondary | ICD-10-CM | POA: Diagnosis not present

## 2015-04-19 LAB — COMPREHENSIVE METABOLIC PANEL
ALT: 32 U/L (ref 14–54)
AST: 53 U/L — AB (ref 15–41)
Albumin: 3.7 g/dL (ref 3.5–5.0)
Alkaline Phosphatase: 93 U/L (ref 38–126)
Anion gap: 10 (ref 5–15)
BUN: 11 mg/dL (ref 6–20)
CHLORIDE: 102 mmol/L (ref 101–111)
CO2: 28 mmol/L (ref 22–32)
CREATININE: 0.57 mg/dL (ref 0.44–1.00)
Calcium: 9 mg/dL (ref 8.9–10.3)
GFR calc non Af Amer: >60 mL/min (ref >60)
Glucose, Bld: 99 mg/dL (ref 65–99)
POTASSIUM: 3.4 mmol/L — AB (ref 3.5–5.1)
SODIUM: 140 mmol/L (ref 135–145)
Total Bilirubin: 2.4 mg/dL — ABNORMAL HIGH (ref 0.3–1.2)
Total Protein: 7.9 g/dL (ref 6.5–8.1)

## 2015-04-19 LAB — CBC WITH DIFFERENTIAL/PLATELET
BASOS ABS: 0 10*3/uL (ref 0.0–0.1)
Basophils Relative: 0 %
EOS ABS: 0.1 10*3/uL (ref 0.0–0.7)
EOS PCT: 1 %
HCT: 40.9 % (ref 36.0–46.0)
Hemoglobin: 13.5 g/dL (ref 12.0–15.0)
Lymphocytes Relative: 11 %
Lymphs Abs: 1.8 10*3/uL (ref 0.7–4.0)
MCH: 30.8 pg (ref 26.0–34.0)
MCHC: 33 g/dL (ref 30.0–36.0)
MCV: 93.2 fL (ref 78.0–100.0)
Monocytes Absolute: 0.9 10*3/uL (ref 0.1–1.0)
Monocytes Relative: 5 %
Neutro Abs: 13.4 10*3/uL — ABNORMAL HIGH (ref 1.7–7.7)
Neutrophils Relative %: 83 %
PLATELETS: 264 10*3/uL (ref 150–400)
RBC: 4.39 MIL/uL (ref 3.87–5.11)
RDW: 12.5 % (ref 11.5–15.5)
WBC: 16.2 10*3/uL — AB (ref 4.0–10.5)

## 2015-04-19 LAB — TROPONIN I

## 2015-04-19 LAB — LIPASE, BLOOD: Lipase: 30 U/L (ref 22–51)

## 2015-04-19 MED ORDER — ONDANSETRON 4 MG PO TBDP: 4.0000 mg | ORAL_TABLET | Freq: Three times a day (TID) | ORAL | Status: DC | PRN

## 2015-04-19 MED ORDER — ONDANSETRON HCL 4 MG/2ML IJ SOLN
4.0000 mg | Freq: Once | INTRAMUSCULAR | Status: AC
  Administered 2015-04-19: 4 mg via INTRAVENOUS
  Filled 2015-04-19: qty 2

## 2015-04-19 MED ORDER — PANTOPRAZOLE SODIUM 40 MG IV SOLR
40.0000 mg | Freq: Once | INTRAVENOUS | Status: AC
  Administered 2015-04-19: 40 mg via INTRAVENOUS
  Filled 2015-04-19: qty 40

## 2015-04-19 MED ORDER — MORPHINE SULFATE (PF) 4 MG/ML IV SOLN
4.0000 mg | INTRAVENOUS | Status: DC | PRN
  Administered 2015-04-19: 4 mg via INTRAVENOUS
  Filled 2015-04-19: qty 1

## 2015-04-19 NOTE — ED Notes (Signed)
Patient reports of pain in back. Dr. Jeneen Rinks made aware. New orders given.

## 2015-04-19 NOTE — ED Notes (Signed)
Instructed patient not to drive. Verbalizes understanding. 

## 2015-04-19 NOTE — Discharge Instructions (Signed)
Clear liquids tonight. Zofran as needed for any additional nausea. You have gallstones. If you have continued episodes of pain after meals call Dr. Arnoldo Morale to discuss possibility of gallbladder surgery.    Cholelithiasis Cholelithiasis (also called gallstones) is a form of gallbladder disease. The gallbladder is a small organ that helps you digest fats. Symptoms of gallstones are:  Feeling sick to your stomach (nausea).  Throwing up (vomiting).  Belly pain.  Yellowing of the skin (jaundice).  Sudden pain. You may feel the pain for minutes to hours.  Fever.  Pain to the touch. HOME CARE  Only take medicines as told by your doctor.  Eat a low-fat diet until you see your doctor again. Eating fat can result in pain.  Follow up with your doctor as told. Attacks usually happen time after time. Surgery is usually needed for permanent treatment. GET HELP RIGHT AWAY IF:   Your pain gets worse.  Your pain is not helped by medicines.  You have a fever and lasting symptoms for more than 2-3 days.  You have a fever and your symptoms suddenly get worse.  You keep feeling sick to your stomach and throwing up. MAKE SURE YOU:   Understand these instructions.  Will watch your condition.  Will get help right away if you are not doing well or get worse. Document Released: 01/02/2008 Document Revised: 03/18/2013 Document Reviewed: 01/07/2013 [facility] Patient Information 2015 [place], [place]. This information is not intended to replace advice given to you by your health care provider. Make sure you discuss any questions you have with your health care provider.

## 2015-04-19 NOTE — ED Provider Notes (Signed)
CSN: [id number]     Arrival date & time 04/19/15  1422 History   First MD Initiated Contact with Patient 04/19/15 1430     Chief Complaint  Patient presents with  . Chest Pain      HPI  Patient position evaluation of nausea and vomiting. Stay she had a hotdog last night became nauseated. Did not have any pain. Vomited twice. Felt better. However she awakened during the night with nausea. Some eggs this morning and had some vomiting. Develop some pain in her mid sternum. This is nearly resolved to the course the morning. Now she has pain, and nausea. No right upper quadrant pain no back pain. No fevers or chills. No dark urine. No history of cold Z's. History of known ulcer disease.  Past Medical History  Diagnosis Date  . COPD (chronic obstructive pulmonary disease)   . Herpes   . Hypertension     pt. doesn't see cardiologist, followed for HTN by Dr. Gerarda Fraction  . Shortness of breath dyspnea   . Anxiety     takes alprazolam - rare use   . GERD (gastroesophageal reflux disease)     pt. reports that its better, no meds in use at this time- 2015  . Arthritis     cervical spondylosis   . PONV (postoperative nausea and vomiting)   . History of pneumonia   . Chronic bronchitis with COPD (chronic obstructive pulmonary disease)   . Brain cyst   . Recurrent falls   . Balance problem   . Urinary frequency   . History of kidney stones   . Inflammation of shoulder joint    Past Surgical History  Procedure Laterality Date  . Abdominal hysterectomy    . Rotator cuff repair Left   . Foreign body removal Left     knee-as child  . Carpal tunnel release Left 02/16/2013    Procedure: CARPAL TUNNEL RELEASE;  Surgeon: Carole Civil, MD;  Location: [facility];  Service: Orthopedics;  Laterality: Left;  . Carpal tunnel release Right 03/27/2013    Procedure: RIGHT CARPAL TUNNEL RELEASE;  Surgeon: Carole Civil, MD;  Location: [facility];  Service: Orthopedics;  Laterality: Right;  . Cesarean section       x2  . Anterior cervical decomp/discectomy fusion N/A 08/06/2014    Procedure: ANTERIOR CERVICAL DECOMPRESSION/DISCECTOMY FUSION CERVICAL THREE-FOUR,CERVICAL SIX-SEVEN ,CERVICAL SEVEN-THORACIC ONE;  Surgeon: Floyce Stakes, MD;  Location: [facility];  Service: Neurosurgery;  Laterality: N/A;  . Colonoscopy    . Nasal sinus surgery N/A 04/13/2015    Procedure: nasal endoscopy with adenoid biopsy;  Surgeon: Ruby Cola, MD;  Location: [facility];  Service: ENT;  Laterality: N/A;   No family history on file. Social History  Substance Use Topics  . Smoking status: Never Smoker   . Smokeless tobacco: Never Used  . Alcohol Use: No   OB History    No data available     Review of Systems  Constitutional: Negative for fever, chills, diaphoresis, appetite change and fatigue.  HENT: Negative for mouth sores, sore throat and trouble swallowing.   Eyes: Negative for visual disturbance.  Respiratory: Negative for cough, chest tightness, shortness of breath and wheezing.   Cardiovascular: Negative for chest pain.  Gastrointestinal: Positive for nausea, vomiting and abdominal pain. Negative for diarrhea and abdominal distention.  Endocrine: Negative for polydipsia, polyphagia and polyuria.  Genitourinary: Negative for dysuria, frequency and hematuria.  Musculoskeletal: Negative for gait problem.  Skin: Negative for color change,  pallor and rash.  Neurological: Negative for dizziness, syncope, light-headedness and headaches.  Hematological: Does not bruise/bleed easily.  Psychiatric/Behavioral: Negative for behavioral problems and confusion.      Allergies  Peanut-containing drug products; Shellfish allergy; and Codeine  Home Medications   Prior to Admission medications   Medication Sig Start Date End Date Taking? Authorizing Provider  acyclovir (ZOVIRAX) 400 MG tablet Take 400 mg by mouth daily.    Yes Historical Provider, MD  amLODipine (NORVASC) 5 MG tablet Take 5 mg by mouth daily with  breakfast.    Yes Historical Provider, MD  amoxicillin (AMOXIL) 500 MG capsule Take 500 mg by mouth 2 (two) times daily.   Yes Historical Provider, MD  aspirin EC 81 MG tablet Take 81 mg by mouth daily.   Yes Historical Provider, MD  budesonide-formoterol (SYMBICORT) 160-4.5 MCG/ACT inhaler Inhale 2 puffs into the lungs 2 (two) times daily.   Yes Historical Provider, MD  diazepam (VALIUM) 5 MG tablet Take 5 mg by mouth every 6 (six) hours as needed for anxiety.   Yes Historical Provider, MD  estradiol (ESTRACE) 1 MG tablet Take 1 mg by mouth daily with breakfast.    Yes Historical Provider, MD  ibuprofen (ADVIL,MOTRIN) 600 MG tablet Take 600 mg by mouth every 8 (eight) hours as needed for mild pain or moderate pain.   Yes Historical Provider, MD  methocarbamol (ROBAXIN) 500 MG tablet Take 1 tablet (500 mg total) by mouth 2 (two) times daily. 01/03/15  Yes Carole Civil, MD  ondansetron (ZOFRAN) 4 MG tablet Take 4 mg by mouth every 8 (eight) hours as needed for nausea or vomiting.   Yes Historical Provider, MD  gabapentin (NEURONTIN) 100 MG capsule TWO CAPSULES BY MOUTH EVERY 8 HOURS Patient not taking: Reported on 04/19/2015 01/04/15   Carole Civil, MD  ondansetron (ZOFRAN ODT) 4 MG disintegrating tablet Take 1 tablet (4 mg total) by mouth every 8 (eight) hours as needed for nausea. 04/19/15   Tanna Furry, MD  oxyCODONE-acetaminophen (PERCOCET/ROXICET) 5-325 MG per tablet Take 1 tablet by mouth every 4 (four) hours as needed. Patient not taking: Reported on 04/19/2015 9/67/59   Delora Fuel, MD   BP 163/84 mmHg  Pulse 89  Temp(Src) 98.8 F (37.1 C)  Resp 18  Ht 5\' 1"  (1.549 m)  Wt 146 lb (66.225 kg)  BMI 27.60 kg/m2  SpO2 95% Physical Exam  Constitutional: She is oriented to person, place, and time. She appears well-developed and well-nourished. No distress.  HENT:  Head: Normocephalic.  Eyes: Conjunctivae are normal. Pupils are equal, round, and reactive to light. No scleral icterus.    Neck: Normal range of motion. Neck supple. No thyromegaly present.  Cardiovascular: Normal rate and regular rhythm.  Exam reveals no gallop and no friction rub.   No murmur heard. Pulmonary/Chest: Effort normal and breath sounds normal. No respiratory distress. She has no wheezes. She has no rales.  Abdominal: Soft. Bowel sounds are normal. She exhibits no distension. There is no tenderness. There is no rebound.    Musculoskeletal: Normal range of motion.  Neurological: She is alert and oriented to person, place, and time.  Skin: Skin is warm and dry. No rash noted.  Psychiatric: She has a normal mood and affect. Her behavior is normal.    ED Course  Procedures (including critical care time) Labs Review Labs Reviewed  CBC WITH DIFFERENTIAL/PLATELET - Abnormal; Notable for the following:    WBC 16.2 (*)    Neutro  Abs 13.4 (*)    All other components within normal limits  COMPREHENSIVE METABOLIC PANEL - Abnormal; Notable for the following:    Potassium 3.4 (*)    AST 53 (*)    Total Bilirubin 2.4 (*)    All other components within normal limits  LIPASE, BLOOD  TROPONIN I    Imaging Review Dg Chest 2 View  04/19/2015   CLINICAL DATA:  Chest pain.  EXAM: CHEST  2 VIEW  COMPARISON:  January 29, 2014.  FINDINGS: The heart size and mediastinal contours are within normal limits. Both lungs are clear. No pneumothorax or pleural effusion is noted. The visualized skeletal structures are unremarkable.  IMPRESSION: No active cardiopulmonary disease.   Electronically Signed   By: Marijo Conception, M.D.   On: 04/19/2015 16:56   US Abdomen Limited Ruq  04/19/2015   CLINICAL DATA:  Vomiting. Elevated bilirubin. History of hypertension, GERD, and history of stones.  EXAM: US ABDOMEN LIMITED - RIGHT UPPER QUADRANT  COMPARISON:  11/02/2011  FINDINGS: Gallbladder:  Within the gallbladder there are numerous shadowing stones and sludge. Largest measurable stone is 1.0 cm. Gallbladder wall is upper limits  normal in thickness, 2.7 mm. No sonographic Murphy's sign.  Common bile duct:  Diameter: 7.1 mm no visible choledocholithiasis. Portions of the duct are obscured by shadowing from stones and bowel gas.  Liver:  No focal lesion identified. Within normal limits in parenchymal echogenicity.  IMPRESSION: 1. Dilated common bile duct with no visible choledocholithiasis. 2. Cholelithiasis. Gallbladder wall upper limits normal in thickness. Negative sonographic Murphy's sign.   Electronically Signed   By: Nolon Nations M.D.   On: 04/19/2015 17:43   I have personally reviewed and evaluated these images and lab results as part of my medical decision-making.   EKG Interpretation   Date/Time:  Tuesday April 19 2015 15:07:33 EDT Ventricular Rate:  83 PR Interval:  147 QRS Duration: 78 QT Interval:  350 QTC Calculation: 411 R Axis:   28 Text Interpretation:  Sinus rhythm Probable left atrial enlargement  Confirmed by Jeneen Rinks  MD, [place] ([id number]) on 04/19/2015 3:15:39 PM      MDM   Final diagnoses:  Vomiting  Gallstones    Symptoms resolved after medication. He is taking some liquids. Redeveloped some epigastric pain and nausea. Again medicated and again resolves. States she is feeling much improved. Ultrasound shows gallstones but no cholecystitis. Dilated duct without sign of choledocholithiasis. Elevated bili. Other enzymes normal. Leukocytosis. No fever. Feeling well. I discussed all these findings with her. It asked her to follow-up with surgery she has any continued postprandial pain. Clear liquids tonight. Zofran for nausea. ER with any changes or worsening.    Tanna Furry, MD 04/19/15 [phone]

## 2015-04-19 NOTE — ED Notes (Signed)
Pt c/o generalized chest/abdominal pain after eating a hot dog and vomiting last night. Pain worse with movement/deep breathe and palpation.

## 2015-04-19 NOTE — ED Notes (Signed)
MD at bedside. 

## 2015-05-10 ENCOUNTER — Telehealth (HOSPITAL_COMMUNITY): Payer: Self-pay

## 2015-05-10 NOTE — Telephone Encounter (Signed)
Patient had to change her appt. She has to be at her MD office on 05-23-15.

## 2015-05-11 ENCOUNTER — Encounter (HOSPITAL_BASED_OUTPATIENT_CLINIC_OR_DEPARTMENT_OTHER): Payer: Self-pay | Admitting: *Deleted

## 2015-05-16 ENCOUNTER — Ambulatory Visit: Payer: Self-pay | Admitting: Physician Assistant

## 2015-05-16 NOTE — H&P (Signed)
Kimberly [name] is an 54 y.o. female.   Chief Complaint: left shoulder pain HPI: She had a rotator cuff repair three years ago by Dr. Aline Brochure and in November 2015 she fell and hit her left shoulder at the end of a treadmill.  She was having a lot of pain in her shoulder and in her neck.  She has been seeing Dr. Aline Brochure who put a corticosteroid injection in her left shoulder about 1-2 months ago. She said this helped for just a day. She has also been seeing Dr. Joya Salm and she has had multiple neck surgeries. She continues to struggle with pain in her neck and in her left shoulder, but especially the shoulder.   She has been sent to physical therapy for her neck but they said they would not do therapy on her shoulder unless she got a note from an orthopedist.  She complains of pain in the top and anterior portion of her shoulder. She states this sometimes goes all the way down into her hand.  This is associated with some numbness on occasion. She said it is worse when it is cold and it is worse when she moves it.   She has trouble raising her arm overhead.  She feels like it is worse than it was even with her initial rotator cuff repair.   She had her ENT problem taken care of. She does have reproducible signs of impingement and AC arthritis. She had a previous rotator cuff repair which seems to be intact. There is a small partial tear. There are a lot of signs of impingement and AC joint arthritis.  Past Medical History  Diagnosis Date  . COPD (chronic obstructive pulmonary disease) ([place])   . Herpes   . Hypertension     pt. doesn't see cardiologist, followed for HTN by Dr. Gerarda Fraction  . Shortness of breath dyspnea   . Anxiety     takes alprazolam - rare use   . GERD (gastroesophageal reflux disease)     pt. reports that its better, no meds in use at this time- 2015  . Arthritis     cervical spondylosis   . PONV (postoperative nausea and vomiting)   . History of pneumonia   . Chronic bronchitis  with COPD (chronic obstructive pulmonary disease) ([place])   . Brain cyst   . Recurrent falls   . Balance problem   . Urinary frequency   . History of kidney stones   . Inflammation of shoulder joint   . Depression     Past Surgical History  Procedure Laterality Date  . Abdominal hysterectomy    . Rotator cuff repair Left   . Foreign body removal Left     knee-as child  . Carpal tunnel release Left 02/16/2013    Procedure: CARPAL TUNNEL RELEASE;  Surgeon: Carole Civil, MD;  Location: [facility];  Service: Orthopedics;  Laterality: Left;  . Carpal tunnel release Right 03/27/2013    Procedure: RIGHT CARPAL TUNNEL RELEASE;  Surgeon: Carole Civil, MD;  Location: [facility];  Service: Orthopedics;  Laterality: Right;  . Cesarean section      x2  . Anterior cervical decomp/discectomy fusion N/A 08/06/2014    Procedure: ANTERIOR CERVICAL DECOMPRESSION/DISCECTOMY FUSION CERVICAL THREE-FOUR,CERVICAL SIX-SEVEN ,CERVICAL SEVEN-THORACIC ONE;  Surgeon: Floyce Stakes, MD;  Location: [facility];  Service: Neurosurgery;  Laterality: N/A;  . Colonoscopy    . Nasal sinus surgery N/A 04/13/2015    Procedure: nasal endoscopy with adenoid biopsy;  Surgeon: Ruby Cola, MD;  Location: [facility];  Service: ENT;  Laterality: N/A;    No family history on file. Social History:  reports that she has never smoked. She has never used smokeless tobacco. She reports that she does not drink alcohol or use illicit drugs.  Allergies:  Allergies  Allergen Reactions  . Peanut-Containing Drug Products Anaphylaxis and Hives  . Shellfish Allergy Anaphylaxis and Hives  . Codeine Other (See Comments)    jitters     (Not in a hospital admission)  No results found for this or any previous visit (from the past 48 hour(s)). No results found.  Review of Systems  Constitutional: Positive for weight loss.  Respiratory: Positive for shortness of breath.   Genitourinary: Positive for hematuria.  Musculoskeletal: Positive  for joint pain.  Neurological: Positive for headaches.  Psychiatric/Behavioral: Positive for depression. The patient is nervous/anxious.   All other systems reviewed and are negative.   There were no vitals taken for this visit. Physical Exam  Constitutional: She is oriented to person, place, and time. She appears well-developed and well-nourished. No distress.  HENT:  Head: Normocephalic and atraumatic.  Nose: Nose normal.  Eyes: Conjunctivae and EOM are normal. Pupils are equal, round, and reactive to light.  Neck: Normal range of motion. Neck supple.  Cardiovascular: Normal rate and intact distal pulses.   Respiratory: Effort normal. No respiratory distress.  GI: Soft. She exhibits no distension. There is no tenderness.  Musculoskeletal:       Left shoulder: She exhibits decreased range of motion, tenderness, bony tenderness and pain.  Neurological: She is alert and oriented to person, place, and time.  Skin: Skin is warm and dry. No rash noted. No erythema.  Psychiatric: She has a normal mood and affect. Her behavior is normal.     Assessment/Plan Left shoulder impingement partial rotator cuff tear with previous repair  She is a candidate for arthroscopic evaluation left shoulder acromioplasty, distal clavicle. It is unlikely she will need a repair but since she has had surgery we will put that on the permit. Risks and benefits discussed in detail with the patient. Proceed on with scheduling based on operating room availability.  Chriss Czar 05/16/2015, 2:00 PM

## 2015-05-18 ENCOUNTER — Ambulatory Visit (HOSPITAL_BASED_OUTPATIENT_CLINIC_OR_DEPARTMENT_OTHER): Payer: 59 | Admitting: Anesthesiology

## 2015-05-18 ENCOUNTER — Encounter (HOSPITAL_BASED_OUTPATIENT_CLINIC_OR_DEPARTMENT_OTHER)
Admission: RE | Disposition: A | Discharge status: Home / Self Care | Payer: Self-pay | Source: Ambulatory Visit | Attending: Orthopedic Surgery

## 2015-05-18 ENCOUNTER — Encounter (HOSPITAL_BASED_OUTPATIENT_CLINIC_OR_DEPARTMENT_OTHER): Payer: Self-pay

## 2015-05-18 ENCOUNTER — Ambulatory Visit (HOSPITAL_BASED_OUTPATIENT_CLINIC_OR_DEPARTMENT_OTHER)
Admission: RE | Admit: 2015-05-18 | Discharge: 2015-05-18 | Disposition: A | Discharge status: Home / Self Care | Payer: 59 | Source: Ambulatory Visit | Attending: Orthopedic Surgery | Admitting: Orthopedic Surgery

## 2015-05-18 DIAGNOSIS — M13812 Other specified arthritis, left shoulder: Secondary | ICD-10-CM | POA: Insufficient documentation

## 2015-05-18 DIAGNOSIS — M7542 Impingement syndrome of left shoulder: Secondary | ICD-10-CM | POA: Diagnosis present

## 2015-05-18 DIAGNOSIS — J449 Chronic obstructive pulmonary disease, unspecified: Secondary | ICD-10-CM | POA: Insufficient documentation

## 2015-05-18 DIAGNOSIS — I10 Essential (primary) hypertension: Secondary | ICD-10-CM | POA: Diagnosis not present

## 2015-05-18 HISTORY — PX: SHOULDER ACROMIOPLASTY: SHX6093

## 2015-05-18 HISTORY — PX: SHOULDER ARTHROSCOPY WITH DISTAL CLAVICLE RESECTION: SHX5675

## 2015-05-18 HISTORY — DX: Major depressive disorder, single episode, unspecified: F32.9

## 2015-05-18 HISTORY — DX: Failed or difficult intubation, initial encounter: T88.4XXA

## 2015-05-18 HISTORY — DX: Depression, unspecified: F32.A

## 2015-05-18 SURGERY — SHOULDER ARTHROSCOPY WITH DISTAL CLAVICLE RESECTION
Anesthesia: General | Site: Shoulder | Laterality: Left

## 2015-05-18 MED ORDER — ONDANSETRON HCL 4 MG/2ML IJ SOLN: 4.0000 mg | Freq: Once | INTRAMUSCULAR | Status: DC | PRN

## 2015-05-18 MED ORDER — DEXAMETHASONE SODIUM PHOSPHATE 10 MG/ML IJ SOLN
INTRAMUSCULAR | Status: AC
  Filled 2015-05-18: qty 1

## 2015-05-18 MED ORDER — PROPOFOL 10 MG/ML IV BOLUS
INTRAVENOUS | Status: DC | PRN
  Administered 2015-05-18: 200 mg via INTRAVENOUS

## 2015-05-18 MED ORDER — PROPOFOL 10 MG/ML IV BOLUS
INTRAVENOUS | Status: AC
  Filled 2015-05-18: qty 20

## 2015-05-18 MED ORDER — SUCCINYLCHOLINE CHLORIDE 20 MG/ML IJ SOLN
INTRAMUSCULAR | Status: DC | PRN
  Administered 2015-05-18: 100 mg via INTRAVENOUS

## 2015-05-18 MED ORDER — OXYCODONE HCL 5 MG PO TABS
ORAL_TABLET | ORAL | Status: AC
  Filled 2015-05-18: qty 1

## 2015-05-18 MED ORDER — FENTANYL CITRATE (PF) 100 MCG/2ML IJ SOLN
25.0000 ug | INTRAMUSCULAR | Status: DC | PRN
  Administered 2015-05-18: 50 ug via INTRAVENOUS

## 2015-05-18 MED ORDER — MIDAZOLAM HCL 2 MG/2ML IJ SOLN
INTRAMUSCULAR | Status: AC
  Filled 2015-05-18: qty 4

## 2015-05-18 MED ORDER — FENTANYL CITRATE (PF) 100 MCG/2ML IJ SOLN
INTRAMUSCULAR | Status: AC
  Filled 2015-05-18: qty 4

## 2015-05-18 MED ORDER — FENTANYL CITRATE (PF) 100 MCG/2ML IJ SOLN
INTRAMUSCULAR | Status: AC
  Filled 2015-05-18: qty 2

## 2015-05-18 MED ORDER — SODIUM CHLORIDE 0.9 % IV SOLN: INTRAVENOUS | Status: DC

## 2015-05-18 MED ORDER — BUPIVACAINE-EPINEPHRINE (PF) 0.5% -1:200000 IJ SOLN
INTRAMUSCULAR | Status: DC | PRN
  Administered 2015-05-18: 20 mL

## 2015-05-18 MED ORDER — FENTANYL CITRATE (PF) 100 MCG/2ML IJ SOLN
50.0000 ug | INTRAMUSCULAR | Status: DC | PRN
  Administered 2015-05-18 (×2): 50 ug via INTRAVENOUS

## 2015-05-18 MED ORDER — MIDAZOLAM HCL 2 MG/2ML IJ SOLN
INTRAMUSCULAR | Status: AC
  Filled 2015-05-18: qty 2

## 2015-05-18 MED ORDER — ONDANSETRON HCL 4 MG/2ML IJ SOLN
INTRAMUSCULAR | Status: DC | PRN
  Administered 2015-05-18: 4 mg via INTRAVENOUS

## 2015-05-18 MED ORDER — GLYCOPYRROLATE 0.2 MG/ML IJ SOLN: 0.2000 mg | Freq: Once | INTRAMUSCULAR | Status: DC | PRN

## 2015-05-18 MED ORDER — CHLORHEXIDINE GLUCONATE 4 % EX LIQD: 60.0000 mL | Freq: Once | CUTANEOUS | Status: DC

## 2015-05-18 MED ORDER — SODIUM CHLORIDE 0.9 % IR SOLN
Status: DC | PRN
  Administered 2015-05-18: 9000 mL

## 2015-05-18 MED ORDER — DEXAMETHASONE SODIUM PHOSPHATE 4 MG/ML IJ SOLN
INTRAMUSCULAR | Status: DC | PRN
  Administered 2015-05-18: 10 mg via INTRAVENOUS

## 2015-05-18 MED ORDER — OXYCODONE HCL 5 MG PO TABS
5.0000 mg | ORAL_TABLET | Freq: Once | ORAL | Status: AC | PRN
  Administered 2015-05-18: 5 mg via ORAL

## 2015-05-18 MED ORDER — SCOPOLAMINE 1 MG/3DAYS TD PT72: 1.0000 | MEDICATED_PATCH | Freq: Once | TRANSDERMAL | Status: DC | PRN

## 2015-05-18 MED ORDER — BUPIVACAINE-EPINEPHRINE (PF) 0.5% -1:200000 IJ SOLN
INTRAMUSCULAR | Status: AC
  Filled 2015-05-18: qty 30

## 2015-05-18 MED ORDER — MIDAZOLAM HCL 2 MG/2ML IJ SOLN
1.0000 mg | INTRAMUSCULAR | Status: DC | PRN
  Administered 2015-05-18 (×2): 1 mg via INTRAVENOUS

## 2015-05-18 MED ORDER — ONDANSETRON HCL 4 MG/2ML IJ SOLN
INTRAMUSCULAR | Status: AC
  Filled 2015-05-18: qty 2

## 2015-05-18 MED ORDER — METHYLPREDNISOLONE ACETATE 80 MG/ML IJ SUSP
INTRAMUSCULAR | Status: AC
  Filled 2015-05-18: qty 1

## 2015-05-18 MED ORDER — CEFAZOLIN SODIUM-DEXTROSE 2-3 GM-% IV SOLR
INTRAVENOUS | Status: AC
  Filled 2015-05-18: qty 50

## 2015-05-18 MED ORDER — MORPHINE SULFATE (PF) 4 MG/ML IV SOLN
INTRAVENOUS | Status: AC
  Filled 2015-05-18: qty 1

## 2015-05-18 MED ORDER — LIDOCAINE HCL (CARDIAC) 20 MG/ML IV SOLN
INTRAVENOUS | Status: AC
  Filled 2015-05-18: qty 15

## 2015-05-18 MED ORDER — CEFAZOLIN SODIUM-DEXTROSE 2-3 GM-% IV SOLR
2.0000 g | INTRAVENOUS | Status: AC
  Administered 2015-05-18: 2 g via INTRAVENOUS

## 2015-05-18 MED ORDER — LACTATED RINGERS IV SOLN
INTRAVENOUS | Status: DC
  Administered 2015-05-18 (×2): via INTRAVENOUS

## 2015-05-18 SURGICAL SUPPLY — 84 items
APL SKNCLS STERI-STRIP NONHPOA (GAUZE/BANDAGES/DRESSINGS)
BENZOIN TINCTURE PRP APPL 2/3 (GAUZE/BANDAGES/DRESSINGS) IMPLANT
BLADE 4.2CUDA (BLADE) ×4 IMPLANT
BLADE AVERAGE 25MMX9MM (BLADE)
BLADE AVERAGE 25X9 (BLADE) IMPLANT
BLADE CUTTER GATOR 3.5 (BLADE) IMPLANT
BLADE SURG 15 STRL LF DISP TIS (BLADE) IMPLANT
BLADE SURG 15 STRL SS (BLADE)
BLADE VORTEX 6.0 (BLADE) IMPLANT
BUR 3.5 LG SPHERICAL (BURR) IMPLANT
BUR EGG 3PK/BX (BURR) IMPLANT
BUR OVAL 4.0 (BURR) IMPLANT
BUR OVAL 6.0 (BURR) ×4 IMPLANT
BUR VERTEX HOODED 4.5 (BURR) IMPLANT
BURR 3.5 LG SPHERICAL (BURR)
BURR 3.5MM LG SPHERICAL (BURR)
CANNULA SHOULDER 7CM (CANNULA) ×4 IMPLANT
CANNULA TWIST IN 8.25X7CM (CANNULA) ×3 IMPLANT
CHLORAPREP W/TINT 26ML (MISCELLANEOUS) ×3 IMPLANT
CLEANER CAUTERY TIP 5X5 PAD (MISCELLANEOUS) IMPLANT
CLOSURE WOUND 1/2 X4 (GAUZE/BANDAGES/DRESSINGS)
CUTTER MENISCUS  4.2MM (BLADE)
CUTTER MENISCUS 4.2MM (BLADE) IMPLANT
DECANTER SPIKE VIAL GLASS SM (MISCELLANEOUS) IMPLANT
DRAPE STERI 35X30 U-POUCH (DRAPES) ×4 IMPLANT
DRAPE SURG 17X23 STRL (DRAPES) ×4 IMPLANT
DRAPE U-SHAPE 76X120 STRL (DRAPES) ×8 IMPLANT
DRSG EMULSION OIL 3X3 NADH (GAUZE/BANDAGES/DRESSINGS) ×4 IMPLANT
DRSG PAD ABDOMINAL 8X10 ST (GAUZE/BANDAGES/DRESSINGS) ×4 IMPLANT
DURAPREP 26ML APPLICATOR (WOUND CARE) ×1 IMPLANT
ELECT REM PT RETURN 9FT ADLT (ELECTROSURGICAL) ×4
ELECTRODE REM PT RTRN 9FT ADLT (ELECTROSURGICAL) ×2 IMPLANT
GAUZE SPONGE 4X4 12PLY STRL (GAUZE/BANDAGES/DRESSINGS) ×4 IMPLANT
GLOVE BIO SURGEON STRL SZ7.5 (GLOVE) ×4 IMPLANT
GLOVE BIOGEL PI IND STRL 8 (GLOVE) ×5 IMPLANT
GLOVE BIOGEL PI INDICATOR 8 (GLOVE) ×6
GLOVE SURG ORTHO 8.0 STRL STRW (GLOVE) ×7 IMPLANT
GLOVE SURG SS PI 7.5 STRL IVOR (GLOVE) ×3 IMPLANT
GOWN STRL REUS W/ TWL LRG LVL3 (GOWN DISPOSABLE) ×2 IMPLANT
GOWN STRL REUS W/ TWL XL LVL3 (GOWN DISPOSABLE) ×2 IMPLANT
GOWN STRL REUS W/TWL LRG LVL3 (GOWN DISPOSABLE) ×4
GOWN STRL REUS W/TWL XL LVL3 (GOWN DISPOSABLE) ×8 IMPLANT
MANIFOLD NEPTUNE II (INSTRUMENTS) ×4 IMPLANT
NDL 1/2 CIR CATGUT .05X1.09 (NEEDLE) IMPLANT
NDL SCORPION MULTI FIRE (NEEDLE) IMPLANT
NEEDLE 1/2 CIR CATGUT .05X1.09 (NEEDLE) IMPLANT
NEEDLE SCORPION MULTI FIRE (NEEDLE) IMPLANT
NS IRRIG 1000ML POUR BTL (IV SOLUTION) ×4 IMPLANT
PACK ARTHROSCOPY DSU (CUSTOM PROCEDURE TRAY) ×4 IMPLANT
PACK BASIN DAY SURGERY FS (CUSTOM PROCEDURE TRAY) ×4 IMPLANT
PAD CLEANER CAUTERY TIP 5X5 (MISCELLANEOUS)
PAD ORTHO SHOULDER 7X19 LRG (SOFTGOODS) ×3 IMPLANT
PENCIL BUTTON HOLSTER BLD 10FT (ELECTRODE) IMPLANT
SET ARTHROSCOPY TUBING (MISCELLANEOUS) ×4
SET ARTHROSCOPY TUBING LN (MISCELLANEOUS) ×2 IMPLANT
SLING ARM LRG ADULT FOAM STRAP (SOFTGOODS) IMPLANT
SLING ARM MED ADULT FOAM STRAP (SOFTGOODS) IMPLANT
SLING ULTRA II MEDIUM (SOFTGOODS) IMPLANT
SLING ULTRA II SMALL (SOFTGOODS) IMPLANT
SPONGE LAP 4X18 X RAY DECT (DISPOSABLE) IMPLANT
STAPLER VISISTAT 35W (STAPLE) IMPLANT
STRIP CLOSURE SKIN 1/2X4 (GAUZE/BANDAGES/DRESSINGS) IMPLANT
SUCTION FRAZIER TIP 10 FR DISP (SUCTIONS) IMPLANT
SUT BONE WAX W31G (SUTURE) IMPLANT
SUT ETHILON 3 0 PS 1 (SUTURE) ×4 IMPLANT
SUT FIBERWIRE #2 38 T-5 BLUE (SUTURE)
SUT MNCRL AB 3-0 PS2 18 (SUTURE) IMPLANT
SUT TICRON 1 T 12 (SUTURE) IMPLANT
SUT TIGER TAPE 7 IN WHITE (SUTURE) IMPLANT
SUT VIC AB 0 CT1 27 (SUTURE)
SUT VIC AB 0 CT1 27XBRD ANBCTR (SUTURE) IMPLANT
SUT VIC AB 1 CT1 27 (SUTURE)
SUT VIC AB 1 CT1 27XBRD ANBCTR (SUTURE) IMPLANT
SUT VIC AB 2-0 SH 27 (SUTURE)
SUT VIC AB 2-0 SH 27XBRD (SUTURE) IMPLANT
SUTURE FIBERWR #2 38 T-5 BLUE (SUTURE) IMPLANT
TAPE FIBER 2MM 7IN #2 BLUE (SUTURE) IMPLANT
TAPE HYPAFIX 6 X30' (GAUZE/BANDAGES/DRESSINGS) ×1
TAPE HYPAFIX 6X30 (GAUZE/BANDAGES/DRESSINGS) ×2 IMPLANT
TOWEL OR 17X24 6PK STRL BLUE (TOWEL DISPOSABLE) ×4 IMPLANT
TOWEL OR NON WOVEN STRL DISP B (DISPOSABLE) ×8 IMPLANT
WAND STAR VAC 90 (SURGICAL WAND) IMPLANT
WATER STERILE IRR 1000ML POUR (IV SOLUTION) ×4 IMPLANT
YANKAUER SUCT BULB TIP NO VENT (SUCTIONS) IMPLANT

## 2015-05-18 NOTE — Anesthesia Procedure Notes (Addendum)
Anesthesia Regional Block:  Interscalene brachial plexus block  Pre-Anesthetic Checklist: ,, timeout performed, Correct Patient, Correct Site, Correct Laterality, Correct Procedure, Correct Position, site marked, Risks and benefits discussed,  Surgical consent,  Pre-op evaluation,  At surgeon's request and post-op pain management  Laterality: Left  Prep: chloraprep       Needles:  Injection technique: Single-shot  Needle Type: Stimiplex      Needle Gauge: 21 and 21 G    Additional Needles:  Procedures: ultrasound guided (picture in chart) and nerve stimulator Interscalene brachial plexus block Narrative:  Injection made incrementally with aspirations every 5 mL.  Performed by: Personally  Anesthesiologist: JUDD, MARY  Additional Notes: Risks, benefits and alternative to block explained extensively.  Patient tolerated procedure well, without complications.   Procedure Name: Intubation Date/Time: 05/18/2015 12:41 PM Performed by: Eilis Chestnutt D Pre-anesthesia Checklist: Patient identified, Emergency Drugs available, Suction available and Patient being monitored Patient Re-evaluated:Patient Re-evaluated prior to inductionOxygen Delivery Method: Circle System Utilized Preoxygenation: Pre-oxygenation with 100% oxygen Intubation Type: IV induction Ventilation: Mask ventilation without difficulty Laryngoscope Size: Glidescope Tube type: Oral Tube size: 7.0 mm Number of attempts: 2 Airway Equipment and Method: Stylet and Oral airway Placement Confirmation: ETT inserted through vocal cords under direct vision,  positive ETCO2 and breath sounds checked- equal and bilateral Secured at: 21 cm Tube secured with: Tape Dental Injury: Teeth and Oropharynx as per pre-operative assessment  Comments: DL x 1 with glidescope blade number 3, easy passage over tongue, good visualization of vocal cords, narrow palate difficult to place tube through vocal cords using glidescope stylet.  Dr M.Judd DL x 1 with glidescope visualization of vocal cords able to pass 7.0 ETT, dentition unchanged, =BBS, + ETC02. She is a difficult airway

## 2015-05-18 NOTE — Discharge Instructions (Signed)
Regional Anesthesia Blocks  1. Numbness or the inability to move the "blocked" extremity may last from 3-48 hours after placement. The length of time depends on the medication injected and your individual response to the medication. If the numbness is not going away after 48 hours, call your surgeon.  2. The extremity that is blocked will need to be protected until the numbness is gone and the  Strength has returned. Because you cannot feel it, you will need to take extra care to avoid injury. Because it may be weak, you may have difficulty moving it or using it. You may not know what position it is in without looking at it while the block is in effect.  3. For blocks in the legs and feet, returning to weight bearing and walking needs to be done carefully. You will need to wait until the numbness is entirely gone and the strength has returned. You should be able to move your leg and foot normally before you try and bear weight or walk. You will need someone to be with you when you first try to ensure you do not fall and possibly risk injury.  4. Bruising and tenderness at the needle site are common side effects and will resolve in a few days.  5. Persistent numbness or new problems with movement should be communicated to the surgeon or the [facility] [phone] [place] [phone]). Post Anesthesia Home Care Instructions  Activity: Get plenty of rest for the remainder of the day. A responsible adult should stay with you for 24 hours following the procedure.  For the next 24 hours, DO NOT: -Drive a car -Paediatric nurse -Drink alcoholic beverages -Take any medication unless instructed by your physician -Make any legal decisions or sign important papers.  Meals: Start with liquid foods such as gelatin or soup. Progress to regular foods as tolerated. Avoid greasy, spicy, heavy foods. If nausea and/or vomiting occur, drink only clear liquids until the nausea  and/or vomiting subsides. Call your physician if vomiting continues.  Special Instructions/Symptoms: Your throat may feel dry or sore from the anesthesia or the breathing tube placed in your throat during surgery. If this causes discomfort, gargle with warm salt water. The discomfort should disappear within 24 hours.  If you had a scopolamine patch placed behind your ear for the management of post- operative nausea and/or vomiting:  1. The medication in the patch is effective for 72 hours, after which it should be removed.  Wrap patch in a tissue and discard in the trash. Wash hands thoroughly with soap and water. 2. You may remove the patch earlier than 72 hours if you experience unpleasant side effects which may include dry mouth, dizziness or visual disturbances. 3. Avoid touching the patch. Wash your hands with soap and water after contact with the patch.        Diet: As you were doing prior to hospitalization   Activity: Increase activity slowly as tolerated  No lifting or driving while in sling  Shower: May shower without a dressing on post op day #3, NO SOAKING in tub   Dressing: You may change your dressing on post op day #3.  Then change the dressing daily with sterile 4"x4"s gauze dressing  Or band aids.  Weight Bearing: nonweight bearing operative arm.  Remain in sling for 3-5 days may remove to shower.    To prevent constipation: you may use a stool softener such as -  Colace ( over  the counter) 100 mg by mouth twice a day  Drink plenty of fluids ( prune juice may be helpful) and high fiber foods  Miralax ( over the counter) for constipation as needed.   Precautions: If you experience chest pain or shortness of breath - call 911 immediately For transfer to the hospital emergency department!!  If you develop a fever greater that 101 F, purulent drainage from wound, increased redness or drainage from wound, or calf pain -- Call the office   Follow- Up Appointment:  Please call for an appointment to be seen in 1 week or as previously scheduled [place] - [phone]

## 2015-05-18 NOTE — Brief Op Note (Signed)
05/18/2015  1:49 PM  PATIENT:  Kimberly [name]  54 y.o. female  PRE-OPERATIVE DIAGNOSIS:  LEFT SHOULDER ROTATOR CUFF TEAR  POST-OPERATIVE DIAGNOSIS:  LEFT SHOULDER ROTATOR CUFF TEAR  PROCEDURE:  Procedure(s): LEFT SHOULDER ARTHROSCOPY WITH  DISTAL CLAVICLE RESECTION (Left) SHOULDER ACROMIOPLASTY (Left)  SURGEON:  Surgeon(s) and Role:    * Earlie Server, MD - Primary  PHYSICIAN ASSISTANT:   ASSISTANTS:  ANESTHESIA:   regional and general  EBL:  Total I/O In: 1200 [I.V.:1200] Out: -   BLOOD ADMINISTERED:none  DRAINS: none   LOCAL MEDICATIONS USED:  NONE  SPECIMEN:  No Specimen  DISPOSITION OF SPECIMEN:  N/A  COUNTS:  YES  TOURNIQUET:  * No tourniquets in log *  DICTATION: .Other Dictation: Dictation Number unknown  PLAN OF CARE: Discharge to home after PACU  PATIENT DISPOSITION:  PACU - hemodynamically stable.   Delay start of Pharmacological VTE agent (>24hrs) due to surgical blood loss or risk of bleeding: not applicable

## 2015-05-18 NOTE — Progress Notes (Signed)
Assisted Dr. Imogene Burn with left, ultrasound guided, interscalene  block. Side rails up, monitors on throughout procedure. See vital signs in flow sheet. Tolerated Procedure well.

## 2015-05-18 NOTE — Anesthesia Postprocedure Evaluation (Signed)
  Anesthesia Post-op Note  Patient: Kimberly [name]  Procedure(s) Performed: Procedure(s) (LRB): LEFT SHOULDER ARTHROSCOPY WITH  DISTAL CLAVICLE RESECTION (Left) SHOULDER ACROMIOPLASTY (Left)  Patient Location: PACU  Anesthesia Type: General  Level of Consciousness: awake and alert   Airway and Oxygen Therapy: Patient Spontanous Breathing  Post-op Pain: mild  Post-op Assessment: Post-op Vital signs reviewed, Patient's Cardiovascular Status Stable, Respiratory Function Stable, Patent Airway and No signs of Nausea or vomiting  Last Vitals:  Filed Vitals:   05/18/15 1415  BP: 121/85  Pulse: 96  Temp:   Resp: 22    Post-op Vital Signs: stable   Complications: No apparent anesthesia complications

## 2015-05-18 NOTE — Transfer of Care (Signed)
Immediate Anesthesia Transfer of Care Note  Patient: Kimberly [name]  Procedure(s) Performed: Procedure(s): LEFT SHOULDER ARTHROSCOPY WITH  DISTAL CLAVICLE RESECTION (Left) SHOULDER ACROMIOPLASTY (Left)  Patient Location: PACU  Anesthesia Type:GA combined with regional for post-op pain  Level of Consciousness: awake, alert , oriented and patient cooperative  Airway & Oxygen Therapy: Patient Spontanous Breathing and Patient connected to face mask oxygen  Post-op Assessment: Report given to RN and Post -op Vital signs reviewed and stable  Post vital signs: Reviewed and stable  Last Vitals:  Filed Vitals:   05/18/15 1402  BP:   Pulse: 100  Temp:   Resp: 20    Complications: No apparent anesthesia complications

## 2015-05-18 NOTE — Anesthesia Preprocedure Evaluation (Addendum)
Anesthesia Evaluation  Patient identified by MRN, date of birth, ID band Patient awake    Reviewed: Allergy & Precautions, NPO status , Patient's Chart, lab work & pertinent test results  History of Anesthesia Complications (+) PONV, DIFFICULT AIRWAY and history of anesthetic complications  Airway Mallampati: III  TM Distance: >3 FB Neck ROM: Limited  Mouth opening: Limited Mouth Opening  Dental no notable dental hx. (+) Dental Advisory Given   Pulmonary COPD,    Pulmonary exam normal breath sounds clear to auscultation       Cardiovascular hypertension, Pt. on medications Normal cardiovascular exam Rhythm:Regular Rate:Normal     Neuro/Psych PSYCHIATRIC DISORDERS Anxiety Depression negative neurological ROS     GI/Hepatic Neg liver ROS, GERD  Medicated and Controlled,  Endo/Other  negative endocrine ROS  Renal/GU negative Renal ROS  negative genitourinary   Musculoskeletal  (+) Arthritis , Osteoarthritis,    Abdominal   Peds negative pediatric ROS (+)  Hematology negative hematology ROS (+)   Anesthesia Other Findings   Reproductive/Obstetrics negative OB ROS                            Anesthesia Physical Anesthesia Plan  ASA: II  Anesthesia Plan: General   Post-op Pain Management:    Induction: Intravenous  Airway Management Planned: Video Laryngoscope Planned and Oral ETT  Additional Equipment:   Intra-op Plan:   Post-operative Plan: Extubation in OR  Informed Consent: I have reviewed the patients History and Physical, chart, labs and discussed the procedure including the risks, benefits and alternatives for the proposed anesthesia with the patient or authorized representative who has indicated his/her understanding and acceptance.   Dental advisory given  Plan Discussed with: CRNA  Anesthesia Plan Comments:         Anesthesia Quick Evaluation

## 2015-05-19 ENCOUNTER — Encounter (HOSPITAL_BASED_OUTPATIENT_CLINIC_OR_DEPARTMENT_OTHER): Payer: Self-pay | Admitting: Orthopedic Surgery

## 2015-05-19 NOTE — Op Note (Signed)
NAME:  [name], [name] NO.:  [id number]  MEDICAL RECORD NO.:  [id number]  LOCATION:                               FACILITY:  [facility]  PHYSICIAN:  Lockie Pares, M.D.    DATE OF BIRTH:  09-Sep-1960  DATE OF PROCEDURE:  05/18/2015 DATE OF DISCHARGE:  05/18/2015                              OPERATIVE REPORT   INDICATIONS:  Left shoulder pain with previous repair with residual evidence of impingement, acromioclavicular joint arthritis, not responding to conservative treatment.  Thought to be amenable to outpatient surgery.  PREOPERATIVE DIAGNOSES: 1. Impingement. 2. Acromioclavicular joint arthritis. 3. Degenerative tear in anterior superior labrum. 4. Partial bursal-sided cuff tear.  POSTOPERATIVE DIAGNOSES: 1. Impingement. 2. Acromioclavicular joint arthritis. 3. Degenerative tear in anterior superior labrum. 4. Partial bursal-sided cuff tear. 5. Mild glenoid chondromalacia.  OPERATIONS: 1. Arthroscopic debridement, intra-articular (extensive). 2. Arthroscopic acromioplasty. 3. Arthroscopic excision of distal clavicle of the left shoulder.  SURGEON:  Lockie Pares, M.D.  ANESTHESIA:  General with nerve block.  DESCRIPTION OF PROCEDURE:  After general anesthetic intubation, arthroscoped in beach-chair position intra-articularly.  Significant tearing of anterior and superior labrum was encountered.  Aggressive debridement was carried out.  There actually was some very mild glenohumeral degenerative change.  Humeral head relatively spared under surface of the cup, __________ had been repaired, but it was intact. Aggressive debridement intra-articularly was carried out.  We then entered the bursa which was very inflamed, hypertrophied with residual acromion noted.  We performed revision acromioplasty, leaving some residual impingement.  AC joint was arthritic, size of 1-1.5 cm distal clavicle.  There was extensive amount of bursal scar tissue.   Bursectomy carried out.  There was a bursal abrasion where the residual acromion was tensioned on the cup. Fortunately, this did not go anywhere close to being a full-thickness tear.  Superior surface of the partial thickness tear was debrided. Shoulder drained free of fluid.  Portals closed with nylon.  Placed a sling, taken to the recovery room in stable condition.     Lockie Pares, M.D.     WDC/MEDQ  D:  05/18/2015  T:  05/18/2015  Job:  [id number]

## 2015-05-23 ENCOUNTER — Ambulatory Visit (HOSPITAL_COMMUNITY): Payer: 59 | Admitting: Specialist

## 2015-05-25 ENCOUNTER — Ambulatory Visit (HOSPITAL_COMMUNITY): Payer: 59 | Attending: Orthopedic Surgery | Admitting: Specialist

## 2015-05-25 DIAGNOSIS — R29898 Other symptoms and signs involving the musculoskeletal system: Secondary | ICD-10-CM | POA: Diagnosis present

## 2015-05-25 DIAGNOSIS — M6289 Other specified disorders of muscle: Secondary | ICD-10-CM

## 2015-05-25 DIAGNOSIS — M25512 Pain in left shoulder: Secondary | ICD-10-CM | POA: Insufficient documentation

## 2015-05-25 DIAGNOSIS — M25612 Stiffness of left shoulder, not elsewhere classified: Secondary | ICD-10-CM | POA: Insufficient documentation

## 2015-05-25 DIAGNOSIS — M629 Disorder of muscle, unspecified: Secondary | ICD-10-CM

## 2015-05-25 DIAGNOSIS — Z9889 Other specified postprocedural states: Secondary | ICD-10-CM

## 2015-05-25 NOTE — Therapy (Signed)
[address] [place], [place], [address] Phone: [phone]   Fax:  [phone]  Occupational Therapy Evaluation  Patient Details  Name: Kimberly [name] MRN: [id number] Date of Birth: 02/12/1961 Referring Provider: Dr. French Ana  Encounter Date: 05/25/2015      OT End of Session - 05/25/15 1350    Visit Number 1   Number of Visits 11   Date for OT Re-Evaluation 07/24/15  mini reassess on 06/23/15   Authorization Type UHC    Authorization Time Period will need to meet goals in order for insurance to continue.  Has 11 visits remaining   Authorization - Visit Number 1   Authorization - Number of Visits 11   OT Start Time 0935   OT Stop Time 1020   OT Time Calculation (min) 45 min   Activity Tolerance Patient tolerated treatment well   Behavior During Therapy WFL for tasks assessed/performed      Past Medical History  Diagnosis Date  . COPD (chronic obstructive pulmonary disease) ([place])   . Herpes   . Hypertension     pt. doesn't see cardiologist, followed for HTN by Dr. Gerarda Fraction  . Shortness of breath dyspnea   . Anxiety     takes alprazolam - rare use   . GERD (gastroesophageal reflux disease)     pt. reports that its better, no meds in use at this time- 2015  . Arthritis     cervical spondylosis   . PONV (postoperative nausea and vomiting)   . History of pneumonia   . Chronic bronchitis with COPD (chronic obstructive pulmonary disease) ([place])   . Brain cyst   . Recurrent falls   . Balance problem   . Urinary frequency   . History of kidney stones   . Inflammation of shoulder joint   . Depression   . Difficult intubation     Small mouth opening, limited neck flexion, very anterior     Past Surgical History  Procedure Laterality Date  . Abdominal hysterectomy    . Rotator cuff repair Left   . Foreign body removal Left     knee-as child  . Carpal tunnel release Left 02/16/2013    Procedure: CARPAL TUNNEL  RELEASE;  Surgeon: Carole Civil, MD;  Location: [facility];  Service: Orthopedics;  Laterality: Left;  . Carpal tunnel release Right 03/27/2013    Procedure: RIGHT CARPAL TUNNEL RELEASE;  Surgeon: Carole Civil, MD;  Location: [facility];  Service: Orthopedics;  Laterality: Right;  . Cesarean section      x2  . Anterior cervical decomp/discectomy fusion N/A 08/06/2014    Procedure: ANTERIOR CERVICAL DECOMPRESSION/DISCECTOMY FUSION CERVICAL THREE-FOUR,CERVICAL SIX-SEVEN ,CERVICAL SEVEN-THORACIC ONE;  Surgeon: Floyce Stakes, MD;  Location: [place];  Service: Neurosurgery;  Laterality: N/A;  . Colonoscopy    . Nasal sinus surgery N/A 04/13/2015    Procedure: nasal endoscopy with adenoid biopsy;  Surgeon: Ruby Cola, MD;  Location: [place];  Service: ENT;  Laterality: N/A;  . Shoulder arthroscopy with distal clavicle resection Left 05/18/2015    Procedure: LEFT SHOULDER ARTHROSCOPY WITH  DISTAL CLAVICLE RESECTION;  Surgeon: Earlie Server, MD;  Location: [place];  Service: Orthopedics;  Laterality: Left;  . Shoulder acromioplasty Left 05/18/2015    Procedure: SHOULDER ACROMIOPLASTY;  Surgeon: Earlie Server, MD;  Location: [place];  Service: Orthopedics;  Laterality: Left;    There were no vitals filed for this visit.  Visit Diagnosis:  Left shoulder pain - Plan: Ot plan of care cert/re-cert  Shoulder weakness - Plan: Ot plan of care cert/re-cert  Tight fascia - Plan: Ot plan of care cert/re-cert  Stiffness of shoulder joint, left - Plan: Ot plan of care cert/re-cert  Status post rotator cuff repair - Plan: Ot plan of care cert/re-cert  Status post decompression of subacromial space - Plan: Ot plan of care cert/re-cert      Subjective Assessment - 05/25/15 1354    Subjective  S:  I fell last November and hit my shoulder on the treadmill.  It has bothered me ever since.   Pertinent History Kimberly [name] is a 54 year old female with past medical history  that includes bilateral carpal tunnel surgery, cervical fusion, left rotator cuff repair, COPD, depression, anxiety, history of a brain cyst, balance deficits.  In November 2015, she fell, hitting her left shoulder on her treadmill.  She has been experiencing increased pain and decreased use of her left shoulder since that time.  Dr. French Ana performed a left DCE/SAD with RCR on 05/18/15.   She has been referred to occupational therapy for evaluation and treatemnt following standard rotator cuff repair protocol.     Patient Stated Goals I want to get better   Currently in Pain? Yes   Pain Score 8    Pain Location Shoulder   Pain Orientation Left   Pain Descriptors / Indicators Constant;Tightness   Pain Type Acute pain   Pain Onset 1 to 4 weeks ago   Pain Frequency Constant   Aggravating Factors  any movement   Pain Relieving Factors hasnt tried heat/ice - recommended she do so   Effect of Pain on Daily Activities unable to use LUE functionally           [facility] OT Assessment - 05/25/15 0001    Assessment   Diagnosis S/P Left SAD/DCE/RCR   Referring Provider Dr. French Ana   Onset Date 05/18/15   Prior Therapy for L RCR several years ago, recently PT for cervical pain   Precautions   Precautions Shoulder   Shoulder Interventions Shoulder sling/immobilizer;At all times   Precaution Comments PROM X 6 weeks (06/29/15) then progress as tolerated   Required Braces or Orthoses Sling   Restrictions   Weight Bearing Restrictions No   Balance Screen   Has the patient fallen in the past 6 months No   Has the patient had a decrease in activity level because of a fear of falling?  No   Is the patient reluctant to leave their home because of a fear of falling?  No   Home  Environment   Family/patient expects to be discharged to: Private residence   Lives With Spouse   Prior Function   Level of [place] Other (comment)  unemployed, applying for disability   Leisure  reading, walking, being outdoors   ADL   ADL comments unable to use left arm with any functional activities, difficult to sleep at night secondary to increased pain   Written Expression   Dominant Hand Right   Vision - History   Baseline Vision No visual deficits   Cognition   Overall Cognitive Status Within Functional Limits for tasks assessed   Observation/Other Assessments   Skin Integrity 3 healing scope surgical incisions   Standing Functional Reach Test 33/100   Sensation   Light Touch Appears Intact   Coordination   Gross Motor Movements are Fluid and Coordinated Yes   Fine  Motor Movements are Fluid and Coordinated Yes   Palpation   Palpation comment mod-max fasical restrictions in left upper arm, scapular, and shoulder region.   AROM   Overall AROM Comments unable to assess due to recent surgery   PROM   Overall PROM Comments assessed in supine, ER/IR with shoulder adducted   PROM Assessment Site Shoulder   Right/Left Shoulder Left   Left Shoulder Flexion 80 Degrees   Left Shoulder ABduction 69 Degrees   Left Shoulder Internal Rotation 75 Degrees   Left Shoulder External Rotation 35 Degrees   Strength   Overall Strength Comments strength not assessed due to recent surgery                  OT Treatments/Exercises (OP) - 05/25/15 0001    Manual Therapy   Manual Therapy Myofascial release   Myofascial Release Myofascial release and manual stretching to left upper arm, scapular, and shoulder region to decrease pain and fascial restrictions and improve pain free mobility               OT Education - 05/25/15 1350    Education provided Yes   Education Details educated on table slides for P/ROM for left shoulder   Person(s) Educated Patient   Methods Explanation;Demonstration;Handout   Comprehension Verbalized understanding;Returned demonstration          OT Short Term Goals - 05/25/15 1358    OT SHORT TERM GOAL #1   Title Patient will be  educated and independent with an HEP for shoulder mobility and strength.   Time 3   Period Weeks   Status New   OT SHORT TERM GOAL #2   Title Patient will improve left shoulder P/ROM to [facility] for increased ability to don and dof clothing independently.   Time 3   Period Weeks   Status New   OT SHORT TERM GOAL #3   Title Patient will decrease pain in her left shoulder to 5/10 or better when completing daily tasks.   Time 3   Period Weeks   Status New   OT SHORT TERM GOAL #4   Title Patient will decrease fascial restrictions to minimal in her left shoulder region for greater mobility needed for ADL completion.    Time 3   Period Weeks   Status New           OT Long Term Goals - 05/25/15 1401    OT LONG TERM GOAL #1   Title Patient will return to prior level of independence with all B/IADLs and leisure activities using left arm as an active assist.   Time 6   Period Weeks   Status New   OT LONG TERM GOAL #2   Title Patient will improve left shoulder A/ROM to [facility] for increased ability to reach into overhead cabinets.   Time 6   Period Weeks   Status New   OT LONG TERM GOAL #3   Title Patient will improve left shoulder strength to 5/5 for increased ability to lift grocery bags.   Time 6   Period Weeks   Status New   OT LONG TERM GOAL #4   Title Patient will decrease pain in left shoulder to 2/10 or better when getting comfortable to sleep.   Time 6   Period Weeks   Status New   OT LONG TERM GOAL #5   Title Patient will decrease fascial restrictions in left shoulder to trace for increased comfort when completing daily activities.  Time 6   Period Weeks   Status New               Plan - 05/25/15 1355    Clinical Impression Statement A:  Mrs. Sautter is a 54 year old female with past medical history that includes bilateral carpal tunnel surgery, cervical fusion, left rotator cuff repair, COPD, depression, anxiety, history of a brain cyst, balance deficits.  In  November 2015, she fell, hitting her left shoulder on her treadmill.  She has been experiencing increased pain and decreased use of her left shoulder since that time.  Dr. French Ana performed a left DCE/SAD with RCR on 05/18/15.   SHe is unable to use her LUE actively with any functional activity, has increased pain, and increased difficulty sleeping due to increased pain.    Pt will benefit from skilled therapeutic intervention in order to improve on the following deficits (Retired) Decreased range of motion;Decreased strength;Impaired UE functional use;Increased muscle spasms;Increased fascial restricitons;Pain   Rehab Potential Good   Clinical Impairments Affecting Rehab Potential multiple surgeries (cervical fusion and past left RCR)   OT Frequency 2x / week   OT Duration 6 weeks   OT Treatment/Interventions Self-care/ADL training;Ultrasound;Therapeutic exercise;Neuromuscular education;Moist Heat;Electrical Stimulation;Manual Therapy;Therapeutic activities;Therapeutic exercises;Passive range of motion;Patient/family education   Plan P:  Skilled OT intervention to decrease pain and restrictions and improve pain free mobility in left shoulder region, per protocol, needed to return to prior level of independence with all B/IADLs.  Next session:  PROM, isometrics, shoulder elevation, row, extension, ball stretches, elbow-hand AROM, bridges.   Consulted and Agree with Plan of Care Patient        Problem List Patient Active Problem List   Diagnosis Date Noted  . Cervical stenosis of spinal canal 08/06/2014  . Muscle weakness (generalized) 11/12/2013  . Tight fascia 11/12/2013  . Decreased range of motion of shoulder 11/12/2013  . Cervical spondylosis without myelopathy 10/29/2013  . S/P carpal tunnel release 02/19/2013  . CTS (carpal tunnel syndrome) 02/19/2013  . SHOULDER PAIN 10/06/2007  . IMPINGEMENT SYNDROME 10/06/2007  . RUPTURE ROTATOR CUFF 10/06/2007  . HIGH BLOOD PRESSURE 10/03/2007     Vangie Bicker, OTR/L [phone]  05/25/2015, 2:14 PM  [facility] [address] [place], [place], [address] Phone: [phone]   Fax:  [phone]  Name: ETHELLE OLA MRN: [id number] Date of Birth: June 12, 1961

## 2015-05-25 NOTE — Patient Instructions (Addendum)
Complete for 30 seconds each, 3-4 times per day, working up to 3 minutes each, 3-4 times per day.  SHOULDER: Flexion On Table   Place hands on table, elbows straight. Slide arms across table, bending at waist. Press hands down into table. Hold ___ seconds. ___ reps per set, ___ sets per day, ___ days per week  Abduction (Passive)   With arm out to side, palm down, resting on table, slide arm across table, bending at your waist. Hold ____ seconds. Repeat ____ times. Do ____ sessions per day.  Copyright  VHI. All rights reserved.     Internal Rotation (Assistive)   Seated with elbow bent at right angle and held against side, slide arm on table surface in an inward arc. Repeat ____ times. Do ____ sessions per day. Activity: Use this motion to brush crumbs off the table.  Copyright  VHI. All rights reserved.

## 2015-06-01 ENCOUNTER — Ambulatory Visit (HOSPITAL_COMMUNITY): Payer: 59 | Attending: Orthopedic Surgery | Admitting: Specialist

## 2015-06-01 DIAGNOSIS — M629 Disorder of muscle, unspecified: Secondary | ICD-10-CM | POA: Insufficient documentation

## 2015-06-01 DIAGNOSIS — M25612 Stiffness of left shoulder, not elsewhere classified: Secondary | ICD-10-CM | POA: Diagnosis present

## 2015-06-01 DIAGNOSIS — R29898 Other symptoms and signs involving the musculoskeletal system: Secondary | ICD-10-CM | POA: Diagnosis not present

## 2015-06-01 DIAGNOSIS — M25512 Pain in left shoulder: Secondary | ICD-10-CM

## 2015-06-01 DIAGNOSIS — M6289 Other specified disorders of muscle: Secondary | ICD-10-CM

## 2015-06-01 NOTE — Therapy (Signed)
[place], [place], [address] Phone: [phone]   Fax:  [phone]  Occupational Therapy Treatment  Patient Details  Name: Kimberly [name] MRN: [id number] Date of Birth: August 03, 1960 Referring Provider: Dr. French Ana  Encounter Date: 06/01/2015      OT End of Session - 06/01/15 1354    Visit Number 2   Number of Visits 11   Date for OT Re-Evaluation 07/24/15  mini reassess on 06/23/15   Authorization Type UHC    Authorization - Visit Number 2   Authorization - Number of Visits 11   OT Start Time 3785   OT Stop Time 1340   OT Time Calculation (min) 32 min   Activity Tolerance Patient tolerated treatment well   Behavior During Therapy [facility] for tasks assessed/performed      Past Medical History  Diagnosis Date  . COPD (chronic obstructive pulmonary disease) ([place])   . Herpes   . Hypertension     pt. doesn't see cardiologist, followed for HTN by Dr. Gerarda Fraction  . Shortness of breath dyspnea   . Anxiety     takes alprazolam - rare use   . GERD (gastroesophageal reflux disease)     pt. reports that its better, no meds in use at this time- 2015  . Arthritis     cervical spondylosis   . PONV (postoperative nausea and vomiting)   . History of pneumonia   . Chronic bronchitis with COPD (chronic obstructive pulmonary disease) ([place])   . Brain cyst   . Recurrent falls   . Balance problem   . Urinary frequency   . History of kidney stones   . Inflammation of shoulder joint   . Depression   . Difficult intubation     Small mouth opening, limited neck flexion, very anterior     Past Surgical History  Procedure Laterality Date  . Abdominal hysterectomy    . Rotator cuff repair Left   . Foreign body removal Left     knee-as child  . Carpal tunnel release Left 02/16/2013    Procedure: CARPAL TUNNEL RELEASE;  Surgeon: Carole Civil, MD;  Location: [facility];  Service: Orthopedics;  Laterality: Left;  . Carpal  tunnel release Right 03/27/2013    Procedure: RIGHT CARPAL TUNNEL RELEASE;  Surgeon: Carole Civil, MD;  Location: [facility];  Service: Orthopedics;  Laterality: Right;  . Cesarean section      x2  . Anterior cervical decomp/discectomy fusion N/A 08/06/2014    Procedure: ANTERIOR CERVICAL DECOMPRESSION/DISCECTOMY FUSION CERVICAL THREE-FOUR,CERVICAL SIX-SEVEN ,CERVICAL SEVEN-THORACIC ONE;  Surgeon: Floyce Stakes, MD;  Location: [place];  Service: Neurosurgery;  Laterality: N/A;  . Colonoscopy    . Nasal sinus surgery N/A 04/13/2015    Procedure: nasal endoscopy with adenoid biopsy;  Surgeon: Ruby Cola, MD;  Location: [place];  Service: ENT;  Laterality: N/A;  . Shoulder arthroscopy with distal clavicle resection Left 05/18/2015    Procedure: LEFT SHOULDER ARTHROSCOPY WITH  DISTAL CLAVICLE RESECTION;  Surgeon: Earlie Server, MD;  Location: [place];  Service: Orthopedics;  Laterality: Left;  . Shoulder acromioplasty Left 05/18/2015    Procedure: SHOULDER ACROMIOPLASTY;  Surgeon: Earlie Server, MD;  Location: [place];  Service: Orthopedics;  Laterality: Left;    There were no vitals filed for this visit.  Visit Diagnosis:  Shoulder weakness  Left shoulder pain  Tight fascia  Stiffness of shoulder joint, left  Subjective Assessment - 06/01/15 1311    Subjective  S:  I am nauseated all of the time.  I am not sure what it is the medication I just dont know   Currently in Pain? Yes   Pain Score 8    Pain Location Shoulder   Pain Orientation Left            [facility] OT Assessment - 06/01/15 0001    Assessment   Diagnosis S/P Left SAD/DCE/RCR   Precautions   Precautions Shoulder   Precaution Comments PROM X 6 weeks (06/29/15) then progress as tolerated                  OT Treatments/Exercises (OP) - 06/01/15 0001    Exercises   Exercises Shoulder   Shoulder Exercises: Supine   Protraction PROM;5 reps   Horizontal ABduction  PROM;5 reps   External Rotation PROM;5 reps   Internal Rotation PROM;5 reps   Flexion PROM;5 reps   ABduction PROM;5 reps   Other Supine Exercises bridging 10 times    Shoulder Exercises: Seated   Elevation AROM;10 reps   Extension AROM;10 reps   Row AROM;10 reps   Other Seated Exercises A/ROM elbow flexion, extension, supination, pronation, wrist flexion, extension 10 times each   Shoulder Exercises: Therapy Ball   Flexion 10 reps   ABduction 10 reps   ABduction Limitations verbal guidance to depress shoulder blade while completing exercises   Shoulder Exercises: Isometric Strengthening   Flexion Supine;3X3"   Extension Supine;3X3"   External Rotation Supine;3X3"   Internal Rotation Supine;3X3"   ABduction Supine;3X3"   ADduction Supine;3X3"   Manual Therapy   Manual Therapy Myofascial release   Myofascial Release Myofascial release and manual stretching to left upper arm, scapular, and shoulder region to decrease pain and fascial restrictions and improve pain free mobility                OT Education - 06/01/15 1353    Education Details reviewed evaluation and treatment plan with patient, issued patient a copy.  Patient in agreeance with goals and treatment plan.   Person(s) Educated Patient   Methods Explanation;Handout   Comprehension Verbalized understanding          OT Short Term Goals - 06/01/15 1356    OT SHORT TERM GOAL #1   Title Patient will be educated and independent with an HEP for shoulder mobility and strength.   Time 3   Period Weeks   Status On-going   OT SHORT TERM GOAL #2   Title Patient will improve left shoulder P/ROM to [facility] for increased ability to don and dof clothing independently.   Time 3   Period Weeks   Status On-going   OT SHORT TERM GOAL #3   Title Patient will decrease pain in her left shoulder to 5/10 or better when completing daily tasks.   Time 3   Period Weeks   Status On-going   OT SHORT TERM GOAL #4   Title Patient  will decrease fascial restrictions to minimal in her left shoulder region for greater mobility needed for ADL completion.    Time 3   Period Weeks   Status On-going           OT Long Term Goals - 06/01/15 1356    OT LONG TERM GOAL #1   Title Patient will return to prior level of independence with all B/IADLs and leisure activities using left arm as an active assist.  Time 6   Period Weeks   Status On-going   OT LONG TERM GOAL #2   Title Patient will improve left shoulder A/ROM to [facility] for increased ability to reach into overhead cabinets.   Time 6   Period Weeks   Status On-going   OT LONG TERM GOAL #3   Title Patient will improve left shoulder strength to 5/5 for increased ability to lift grocery bags.   Time 6   Period Weeks   Status On-going   OT LONG TERM GOAL #4   Title Patient will decrease pain in left shoulder to 2/10 or better when getting comfortable to sleep.   Time 6   Period Weeks   Status On-going   OT LONG TERM GOAL #5   Title Patient will decrease fascial restrictions in left shoulder to trace for increased comfort when completing daily activities.    Time 6   Period Weeks   Status On-going               Plan - 06/01/15 1354    Clinical Impression Statement A:  Patient with 75% P/ROM or better this date in supine.  Reviewed HEP and patient is completing as directed at home.   Plan P;  Increase contraction time with isometrics to strengthen arm for greater support during stretching and in preparation for return to functional activities as protocol allows.        Problem List Patient Active Problem List   Diagnosis Date Noted  . Cervical stenosis of spinal canal 08/06/2014  . Muscle weakness (generalized) 11/12/2013  . Tight fascia 11/12/2013  . Decreased range of motion of shoulder 11/12/2013  . Cervical spondylosis without myelopathy 10/29/2013  . S/P carpal tunnel release 02/19/2013  . CTS (carpal tunnel syndrome) 02/19/2013  . SHOULDER  PAIN 10/06/2007  . IMPINGEMENT SYNDROME 10/06/2007  . RUPTURE ROTATOR CUFF 10/06/2007  . HIGH BLOOD PRESSURE 10/03/2007    Vangie Bicker, OTR/L [phone]  06/01/2015, 1:57 PM  [facility] [address] [place], [place], [address] Phone: [phone]   Fax:  [phone]  Name: Kimberly [name] MRN: [id number] Date of Birth: July 04, 1961

## 2015-06-03 ENCOUNTER — Ambulatory Visit (HOSPITAL_COMMUNITY): Payer: 59 | Admitting: Occupational Therapy

## 2015-06-03 DIAGNOSIS — M6289 Other specified disorders of muscle: Secondary | ICD-10-CM

## 2015-06-03 DIAGNOSIS — M25512 Pain in left shoulder: Secondary | ICD-10-CM

## 2015-06-03 DIAGNOSIS — M629 Disorder of muscle, unspecified: Secondary | ICD-10-CM

## 2015-06-03 DIAGNOSIS — R29898 Other symptoms and signs involving the musculoskeletal system: Secondary | ICD-10-CM | POA: Diagnosis not present

## 2015-06-03 DIAGNOSIS — M25612 Stiffness of left shoulder, not elsewhere classified: Secondary | ICD-10-CM

## 2015-06-03 NOTE — Therapy (Signed)
[place], [place], [address] Phone: [phone]   Fax:  [phone]  Occupational Therapy Treatment  Patient Details  Name: Kimberly [name] MRN: [id number] Date of Birth: July 11, 1961 Referring Provider: Dr. French Ana  Encounter Date: 06/03/2015      OT End of Session - 06/03/15 1343    Visit Number 3   Number of Visits 11   Date for OT Re-Evaluation 07/24/15  mini reassess on 06/23/15   Authorization Type UHC    Authorization - Visit Number 3   Authorization - Number of Visits 11   OT Start Time 1301   OT Stop Time 1341   OT Time Calculation (min) 40 min   Activity Tolerance Patient tolerated treatment well   Behavior During Therapy [facility] for tasks assessed/performed      Past Medical History  Diagnosis Date  . COPD (chronic obstructive pulmonary disease) ([place])   . Herpes   . Hypertension     pt. doesn't see cardiologist, followed for HTN by Dr. Gerarda Fraction  . Shortness of breath dyspnea   . Anxiety     takes alprazolam - rare use   . GERD (gastroesophageal reflux disease)     pt. reports that its better, no meds in use at this time- 2015  . Arthritis     cervical spondylosis   . PONV (postoperative nausea and vomiting)   . History of pneumonia   . Chronic bronchitis with COPD (chronic obstructive pulmonary disease) ([place])   . Brain cyst   . Recurrent falls   . Balance problem   . Urinary frequency   . History of kidney stones   . Inflammation of shoulder joint   . Depression   . Difficult intubation     Small mouth opening, limited neck flexion, very anterior     Past Surgical History  Procedure Laterality Date  . Abdominal hysterectomy    . Rotator cuff repair Left   . Foreign body removal Left     knee-as child  . Carpal tunnel release Left 02/16/2013    Procedure: CARPAL TUNNEL RELEASE;  Surgeon: Carole Civil, MD;  Location: [facility];  Service: Orthopedics;  Laterality: Left;  . Carpal  tunnel release Right 03/27/2013    Procedure: RIGHT CARPAL TUNNEL RELEASE;  Surgeon: Carole Civil, MD;  Location: [facility];  Service: Orthopedics;  Laterality: Right;  . Cesarean section      x2  . Anterior cervical decomp/discectomy fusion N/A 08/06/2014    Procedure: ANTERIOR CERVICAL DECOMPRESSION/DISCECTOMY FUSION CERVICAL THREE-FOUR,CERVICAL SIX-SEVEN ,CERVICAL SEVEN-THORACIC ONE;  Surgeon: Floyce Stakes, MD;  Location: [place];  Service: Neurosurgery;  Laterality: N/A;  . Colonoscopy    . Nasal sinus surgery N/A 04/13/2015    Procedure: nasal endoscopy with adenoid biopsy;  Surgeon: Ruby Cola, MD;  Location: [place];  Service: ENT;  Laterality: N/A;  . Shoulder arthroscopy with distal clavicle resection Left 05/18/2015    Procedure: LEFT SHOULDER ARTHROSCOPY WITH  DISTAL CLAVICLE RESECTION;  Surgeon: Earlie Server, MD;  Location: [place];  Service: Orthopedics;  Laterality: Left;  . Shoulder acromioplasty Left 05/18/2015    Procedure: SHOULDER ACROMIOPLASTY;  Surgeon: Earlie Server, MD;  Location: [place];  Service: Orthopedics;  Laterality: Left;    There were no vitals filed for this visit.  Visit Diagnosis:  Shoulder weakness  Left shoulder pain  Tight fascia  Stiffness of shoulder joint, left  Subjective Assessment - 06/03/15 1303    Subjective  S: I'm in a little pain today.    Currently in Pain? Yes   Pain Score 7    Pain Location Shoulder   Pain Orientation Left   Pain Descriptors / Indicators Aching   Pain Type Acute pain                      OT Treatments/Exercises (OP) - 06/03/15 1303    Exercises   Exercises Shoulder   Shoulder Exercises: Supine   Protraction PROM;10 reps   Horizontal ABduction PROM;10 reps   External Rotation PROM;10 reps   Internal Rotation PROM;10 reps   Flexion PROM;10 reps   ABduction PROM;10 reps   Shoulder Exercises: Seated   Elevation AROM;10 reps   Extension AROM;10  reps   Row AROM;10 reps   Other Seated Exercises A/ROM elbow flexion, extension, supination, pronation, wrist flexion, extension 10 times each   Shoulder Exercises: Therapy Ball   Flexion 10 reps   ABduction 10 reps   Shoulder Exercises: Isometric Strengthening   Flexion Supine;3X5"   Extension Supine;3X5"   External Rotation Supine;3X5"   Internal Rotation Supine;3X5"   ABduction Supine;3X5"   ADduction Supine;3X5"   Manual Therapy   Manual Therapy Myofascial release   Myofascial Release Myofascial release and manual stretching to left upper arm, scapular, and shoulder region to decrease pain and fascial restrictions and improve pain free mobility                  OT Short Term Goals - 06/01/15 1356    OT SHORT TERM GOAL #1   Title Patient will be educated and independent with an HEP for shoulder mobility and strength.   Time 3   Period Weeks   Status On-going   OT SHORT TERM GOAL #2   Title Patient will improve left shoulder P/ROM to [facility] for increased ability to don and dof clothing independently.   Time 3   Period Weeks   Status On-going   OT SHORT TERM GOAL #3   Title Patient will decrease pain in her left shoulder to 5/10 or better when completing daily tasks.   Time 3   Period Weeks   Status On-going   OT SHORT TERM GOAL #4   Title Patient will decrease fascial restrictions to minimal in her left shoulder region for greater mobility needed for ADL completion.    Time 3   Period Weeks   Status On-going           OT Long Term Goals - 06/01/15 1356    OT LONG TERM GOAL #1   Title Patient will return to prior level of independence with all B/IADLs and leisure activities using left arm as an active assist.   Time 6   Period Weeks   Status On-going   OT LONG TERM GOAL #2   Title Patient will improve left shoulder A/ROM to [facility] for increased ability to reach into overhead cabinets.   Time 6   Period Weeks   Status On-going   OT LONG TERM GOAL #3    Title Patient will improve left shoulder strength to 5/5 for increased ability to lift grocery bags.   Time 6   Period Weeks   Status On-going   OT LONG TERM GOAL #4   Title Patient will decrease pain in left shoulder to 2/10 or better when getting comfortable to sleep.   Time 6   Period  Weeks   Status On-going   OT LONG TERM GOAL #5   Title Patient will decrease fascial restrictions in left shoulder to trace for increased comfort when completing daily activities.    Time 6   Period Weeks   Status On-going               Plan - 06/03/15 1343    Clinical Impression Statement A: Increased isometrics to 3X5, continued to follow protocol during exercises. Pt continues to demonstrate 75% or better P/AROM this session. She reports all exercises are going well at home.    Plan P: Continue to follow protocol, increase seated A/ROM and therapy ball to 12 repetitions.         Problem List Patient Active Problem List   Diagnosis Date Noted  . Cervical stenosis of spinal canal 08/06/2014  . Muscle weakness (generalized) 11/12/2013  . Tight fascia 11/12/2013  . Decreased range of motion of shoulder 11/12/2013  . Cervical spondylosis without myelopathy 10/29/2013  . S/P carpal tunnel release 02/19/2013  . CTS (carpal tunnel syndrome) 02/19/2013  . SHOULDER PAIN 10/06/2007  . IMPINGEMENT SYNDROME 10/06/2007  . RUPTURE ROTATOR CUFF 10/06/2007  . HIGH BLOOD PRESSURE 10/03/2007    Guadelupe Sabin, OTR/L  [phone]  06/03/2015, 1:45 PM  [place] [address] [place], [place], [address] Phone: [phone]   Fax:  [phone]  Name: Kimberly [name] MRN: [id number] Date of Birth: [birth year]

## 2015-06-08 ENCOUNTER — Ambulatory Visit (HOSPITAL_COMMUNITY): Payer: 59

## 2015-06-08 DIAGNOSIS — M6289 Other specified disorders of muscle: Secondary | ICD-10-CM

## 2015-06-08 DIAGNOSIS — M25612 Stiffness of left shoulder, not elsewhere classified: Secondary | ICD-10-CM

## 2015-06-08 DIAGNOSIS — R29898 Other symptoms and signs involving the musculoskeletal system: Secondary | ICD-10-CM

## 2015-06-08 DIAGNOSIS — M25512 Pain in left shoulder: Secondary | ICD-10-CM

## 2015-06-08 DIAGNOSIS — M629 Disorder of muscle, unspecified: Secondary | ICD-10-CM

## 2015-06-08 NOTE — Therapy (Signed)
[place], [place], [address] Phone: [phone]   Fax:  [phone]  Occupational Therapy Treatment  Patient Details  Name: Kimberly [name] MRN: [id number] Date of Birth: [birth year] Referring Provider: Dr. French Ana  Encounter Date: 06/08/2015      OT End of Session - 06/08/15 1211    Visit Number 4   Number of Visits 11   Date for OT Re-Evaluation 07/24/15  mini reassess on 06/23/15   Authorization Type UHC    Authorization Time Period will need to meet goals in order for insurance to continue.  Has 11 visits remaining   Authorization - Visit Number 4   Authorization - Number of Visits 11   OT Start Time 1100   OT Stop Time 1145   OT Time Calculation (min) 45 min   Activity Tolerance Patient tolerated treatment well   Behavior During Therapy WFL for tasks assessed/performed      Past Medical History  Diagnosis Date  . COPD (chronic obstructive pulmonary disease) ([place])   . Herpes   . Hypertension     pt. doesn't see cardiologist, followed for HTN by Dr. Gerarda Fraction  . Shortness of breath dyspnea   . Anxiety     takes alprazolam - rare use   . GERD (gastroesophageal reflux disease)     pt. reports that its better, no meds in use at this time- 2015  . Arthritis     cervical spondylosis   . PONV (postoperative nausea and vomiting)   . History of pneumonia   . Chronic bronchitis with COPD (chronic obstructive pulmonary disease) ([place])   . Brain cyst   . Recurrent falls   . Balance problem   . Urinary frequency   . History of kidney stones   . Inflammation of shoulder joint   . Depression   . Difficult intubation     Small mouth opening, limited neck flexion, very anterior     Past Surgical History  Procedure Laterality Date  . Abdominal hysterectomy    . Rotator cuff repair Left   . Foreign body removal Left     knee-as child  . Carpal tunnel release Left 02/16/2013    Procedure: CARPAL TUNNEL  RELEASE;  Surgeon: Carole Civil, MD;  [facility];  Service: Orthopedics;  Laterality: Left;  . Carpal tunnel release Right 03/27/2013    Procedure: RIGHT CARPAL TUNNEL RELEASE;  Surgeon: Carole Civil, MD;  Location: [facility];  Service: Orthopedics;  Laterality: Right;  . Cesarean section      x2  . Anterior cervical decomp/discectomy fusion N/A 08/06/2014    Procedure: ANTERIOR CERVICAL DECOMPRESSION/DISCECTOMY FUSION CERVICAL THREE-FOUR,CERVICAL SIX-SEVEN ,CERVICAL SEVEN-THORACIC ONE;  Surgeon: Floyce Stakes, MD;  Location: [place];  Service: Neurosurgery;  Laterality: N/A;  . Colonoscopy    . Nasal sinus surgery N/A 04/13/2015    Procedure: nasal endoscopy with adenoid biopsy;  Surgeon: Ruby Cola, MD;  Location: [place];  Service: ENT;  Laterality: N/A;  . Shoulder arthroscopy with distal clavicle resection Left 05/18/2015    Procedure: LEFT SHOULDER ARTHROSCOPY WITH  DISTAL CLAVICLE RESECTION;  Surgeon: Earlie Server, MD;  Location: [place];  Service: Orthopedics;  Laterality: Left;  . Shoulder acromioplasty Left 05/18/2015    Procedure: SHOULDER ACROMIOPLASTY;  Surgeon: Earlie Server, MD;  Location: [place];  Service: Orthopedics;  Laterality: Left;    There were no vitals filed for this visit.  Visit Diagnosis:  Shoulder weakness  Left shoulder pain  Tight fascia  Stiffness of shoulder joint, left      Subjective Assessment - 06/08/15 1212    Subjective  S: My arm is doing really good.   Currently in Pain? Yes   Pain Score 6    Pain Location Shoulder   Pain Orientation Left   Pain Descriptors / Indicators Aching            [facility] OT Assessment - 06/08/15 1116    Assessment   Diagnosis S/P Left SAD/DCE/RCR   Precautions   Precautions Shoulder   Shoulder Interventions Shoulder sling/immobilizer;At all times   Precaution Comments PROM X 6 weeks (06/29/15) then progress as tolerated   Required Braces or Orthoses  Sling                  OT Treatments/Exercises (OP) - 06/08/15 1117    Exercises   Exercises Shoulder   Shoulder Exercises: Supine   Protraction PROM;10 reps   Horizontal ABduction PROM;10 reps   External Rotation PROM;10 reps   Internal Rotation PROM;10 reps   Flexion PROM;10 reps   ABduction PROM;10 reps   Other Supine Exercises bridging 10 times; 10" hold   Shoulder Exercises: Seated   Elevation AROM;12 reps   Extension AROM;12 reps   Row AROM;12 reps   Shoulder Exercises: Therapy Ball   Flexion 15 reps   ABduction 15 reps   Shoulder Exercises: ROM/Strengthening   Anterior Glide 3x10"   Caudal Glide 3x10"   Shoulder Exercises: Isometric Strengthening   Flexion Supine;3X5"   Extension Supine;3X5"   External Rotation Supine;3X5"   Internal Rotation Supine;3X5"   ABduction Supine;3X5"   ADduction Supine;3X5"   Manual Therapy   Manual Therapy Myofascial release   Myofascial Release Myofascial release and manual stretching to left upper arm, scapular, and shoulder region to decrease pain and fascial restrictions and improve pain free mobility                  OT Short Term Goals - 06/01/15 1356    OT SHORT TERM GOAL #1   Title Patient will be educated and independent with an HEP for shoulder mobility and strength.   Time 3   Period Weeks   Status On-going   OT SHORT TERM GOAL #2   Title Patient will improve left shoulder P/ROM to [facility] for increased ability to don and dof clothing independently.   Time 3   Period Weeks   Status On-going   OT SHORT TERM GOAL #3   Title Patient will decrease pain in her left shoulder to 5/10 or better when completing daily tasks.   Time 3   Period Weeks   Status On-going   OT SHORT TERM GOAL #4   Title Patient will decrease fascial restrictions to minimal in her left shoulder region for greater mobility needed for ADL completion.    Time 3   Period Weeks   Status On-going           OT Long Term Goals -  06/01/15 1356    OT LONG TERM GOAL #1   Title Patient will return to prior level of independence with all B/IADLs and leisure activities using left arm as an active assist.   Time 6   Period Weeks   Status On-going   OT LONG TERM GOAL #2   Title Patient will improve left shoulder A/ROM to [facility] for increased ability to reach into overhead cabinets.   Time  6   Period Weeks   Status On-going   OT LONG TERM GOAL #3   Title Patient will improve left shoulder strength to 5/5 for increased ability to lift grocery bags.   Time 6   Period Weeks   Status On-going   OT LONG TERM GOAL #4   Title Patient will decrease pain in left shoulder to 2/10 or better when getting comfortable to sleep.   Time 6   Period Weeks   Status On-going   OT LONG TERM GOAL #5   Title Patient will decrease fascial restrictions in left shoulder to trace for increased comfort when completing daily activities.    Time 6   Period Weeks   Status On-going               Plan - 06/08/15 1211    Clinical Impression Statement A: Increased therapy balls and A/ROM repeitions. Patient required initial VC for form and technique during shoulder glides.    Plan P: Cont to follow protocol and work on increase passive ROM during manual stretching.        Problem List Patient Active Problem List   Diagnosis Date Noted  . Cervical stenosis of spinal canal 08/06/2014  . Muscle weakness (generalized) 11/12/2013  . Tight fascia 11/12/2013  . Decreased range of motion of shoulder 11/12/2013  . Cervical spondylosis without myelopathy 10/29/2013  . S/P carpal tunnel release 02/19/2013  . CTS (carpal tunnel syndrome) 02/19/2013  . SHOULDER PAIN 10/06/2007  . IMPINGEMENT SYNDROME 10/06/2007  . RUPTURE ROTATOR CUFF 10/06/2007  . HIGH BLOOD PRESSURE 10/03/2007    Ailene Ravel, OTR/L,CBIS  [phone]  06/08/2015, 2:00 PM  [place] [address] [place], [place],  [address] Phone: [phone]   Fax:  [phone]  Name: PERCY COMP MRN: [id number] Date of Birth: 10-05-1960

## 2015-06-10 ENCOUNTER — Encounter (HOSPITAL_COMMUNITY): Payer: Self-pay

## 2015-06-10 ENCOUNTER — Ambulatory Visit (HOSPITAL_COMMUNITY): Payer: 59

## 2015-06-10 DIAGNOSIS — M6289 Other specified disorders of muscle: Secondary | ICD-10-CM

## 2015-06-10 DIAGNOSIS — R29898 Other symptoms and signs involving the musculoskeletal system: Secondary | ICD-10-CM

## 2015-06-10 DIAGNOSIS — M25612 Stiffness of left shoulder, not elsewhere classified: Secondary | ICD-10-CM

## 2015-06-10 DIAGNOSIS — M629 Disorder of muscle, unspecified: Secondary | ICD-10-CM

## 2015-06-10 DIAGNOSIS — M25512 Pain in left shoulder: Secondary | ICD-10-CM

## 2015-06-10 NOTE — Therapy (Signed)
[facility] [address] [place], [place], [address] Phone: [phone]   Fax:  [phone]  Occupational Therapy Treatment  Patient Details  Name: Kimberly [name] MRN: [id number] Date of Birth: 10/20/1960 Referring Provider: Dr. Caffrey  Encounter Date: 06/10/2015      OT End of Session - 06/10/15 1338    Visit Number 5   Number of Visits 11   Date for OT Re-Evaluation 07/24/15  mini reassess on 06/23/15   Authorization Type UHC    Authorization Time Period will need to meet goals in order for insurance to continue.  Has 11 visits remaining   Authorization - Visit Number 5   Authorization - Number of Visits 11   OT Start Time 1301   OT Stop Time 1340   OT Time Calculation (min) 39 min   Activity Tolerance Patient tolerated treatment well   Behavior During Therapy WFL for tasks assessed/performed      Past Medical History  Diagnosis Date  . COPD (chronic obstructive pulmonary disease) ([facility])   . Herpes   . Hypertension     pt. doesn't see cardiologist, followed for HTN by Dr. Fusco  . Shortness of breath dyspnea   . Anxiety     takes alprazolam - rare use   . GERD (gastroesophageal reflux disease)     pt. reports that its better, no meds in use at this time- 2015  . Arthritis     cervical spondylosis   . PONV (postoperative nausea and vomiting)   . History of pneumonia   . Chronic bronchitis with COPD (chronic obstructive pulmonary disease) ([facility])   . Brain cyst   . Recurrent falls   . Balance problem   . Urinary frequency   . History of kidney stones   . Inflammation of shoulder joint   . Depression   . Difficult intubation     Small mouth opening, limited neck flexion, very anterior     Past Surgical History  Procedure Laterality Date  . Abdominal hysterectomy    . Rotator cuff repair Left   . Foreign body removal Left     knee-as child  . Carpal tunnel release Left 02/16/2013    Procedure: CARPAL TUNNEL  RELEASE;  Surgeon: Stanley E Harrison, MD;  [facility];  Service: Orthopedics;  Laterality: Left;  . Carpal tunnel release Right 03/27/2013    Procedure: RIGHT CARPAL TUNNEL RELEASE;  Surgeon: Stanley E Harrison, MD;  [facility]: [facility];  Service: Orthopedics;  Laterality: Right;  . Cesarean section      x2  . Anterior cervical decomp/discectomy fusion N/A 08/06/2014    Procedure: ANTERIOR CERVICAL DECOMPRESSION/DISCECTOMY FUSION CERVICAL THREE-FOUR,CERVICAL SIX-SEVEN ,CERVICAL SEVEN-THORACIC ONE;  Surgeon: Ernesto M Botero, MD;  [facility];  Service: Neurosurgery;  Laterality: N/A;  . Colonoscopy    . Nasal sinus surgery N/A 04/13/2015    Procedure: nasal endoscopy with adenoid biopsy;  Surgeon: Mitchell Gore, MD;  [facility];  Service: ENT;  Laterality: N/A;  . Shoulder arthroscopy with distal clavicle resection Left 05/18/2015    Procedure: LEFT SHOULDER ARTHROSCOPY WITH  DISTAL CLAVICLE RESECTION;  Surgeon: Daniel Caffrey, MD;  Location: [facility];  Service: Orthopedics;  Laterality: Left;  . Shoulder acromioplasty Left 05/18/2015    Procedure: SHOULDER ACROMIOPLASTY;  Surgeon: Daniel Caffrey, MD;  Location: [facility];  Service: Orthopedics;  Laterality: Left;    There were no vitals filed for this visit.  Visit Diagnosis:    Shoulder weakness  Left shoulder pain  Tight fascia  Stiffness of shoulder joint, left      Subjective Assessment - 06/10/15 1320    Subjective  S: My arm is still sore but it's not as bad as the other day.   Currently in Pain? Yes   Pain Score 5    Pain Location Shoulder   Pain Orientation Left   Pain Descriptors / Indicators Aching   Pain Type Acute pain            [facility] OT Assessment - 06/10/15 1321    Assessment   Diagnosis S/P Left SAD/DCE/RCR   Precautions   Precautions Shoulder   Shoulder Interventions Shoulder sling/immobilizer;At all times   Precaution Comments PROM X 6 weeks (06/29/15) then  progress as tolerated   Required Braces or Orthoses Sling                  OT Treatments/Exercises (OP) - 06/10/15 1321    Exercises   Exercises Shoulder   Shoulder Exercises: Supine   Protraction PROM;10 reps   Horizontal ABduction PROM;10 reps   External Rotation PROM;10 reps   Internal Rotation PROM;10 reps   Flexion PROM;10 reps   ABduction PROM;10 reps   Other Supine Exercises bridging 10 times; 10" hold   Shoulder Exercises: Seated   Elevation AROM;12 reps   Extension AROM;12 reps   Row AROM;12 reps   Shoulder Exercises: Therapy Ball   Flexion 15 reps   ABduction 15 reps   Shoulder Exercises: ROM/Strengthening   Thumb Tacks 1'   Anterior Glide 3x10"   Caudal Glide 3x10"   Prot/Ret//Elev/Dep 1'   Shoulder Exercises: Isometric Strengthening   Flexion Supine;3X5"   Extension Supine;3X5"   External Rotation Supine;3X5"   Internal Rotation Supine;3X5"   ABduction Supine;3X5"   ADduction Supine;3X5"   Manual Therapy   Manual Therapy Myofascial release   Myofascial Release Myofascial release and manual stretching to left upper arm, scapular, and shoulder region to decrease pain and fascial restrictions and improve pain free mobility                  OT Short Term Goals - 06/01/15 1356    OT SHORT TERM GOAL #1   Title Patient will be educated and independent with an HEP for shoulder mobility and strength.   Time 3   Period Weeks   Status On-going   OT SHORT TERM GOAL #2   Title Patient will improve left shoulder P/ROM to [facility] for increased ability to don and dof clothing independently.   Time 3   Period Weeks   Status On-going   OT SHORT TERM GOAL #3   Title Patient will decrease pain in her left shoulder to 5/10 or better when completing daily tasks.   Time 3   Period Weeks   Status On-going   OT SHORT TERM GOAL #4   Title Patient will decrease fascial restrictions to minimal in her left shoulder region for greater mobility needed for ADL  completion.    Time 3   Period Weeks   Status On-going           OT Long Term Goals - 06/01/15 1356    OT LONG TERM GOAL #1   Title Patient will return to prior level of independence with all B/IADLs and leisure activities using left arm as an active assist.   Time 6   Period Weeks   Status On-going   OT LONG TERM GOAL #  2   Title Patient will improve left shoulder A/ROM to [facility] for increased ability to reach into overhead cabinets.   Time 6   Period Weeks   Status On-going   OT LONG TERM GOAL #3   Title Patient will improve left shoulder strength to 5/5 for increased ability to lift grocery bags.   Time 6   Period Weeks   Status On-going   OT LONG TERM GOAL #4   Title Patient will decrease pain in left shoulder to 2/10 or better when getting comfortable to sleep.   Time 6   Period Weeks   Status On-going   OT LONG TERM GOAL #5   Title Patient will decrease fascial restrictions in left shoulder to trace for increased comfort when completing daily activities.    Time 6   Period Weeks   Status On-going               Plan - 06/10/15 1338    Clinical Impression Statement A: Pt required VC and physical cues during pro/ret/elev/dep for proper form and technique. patient showed an increase in shoulder flexion and abduction during passive stretching.    Plan P: Continue to follow protocol. Work on increasing passive ROM during manual stretching needed to complete functional tasks.        Problem List Patient Active Problem List   Diagnosis Date Noted  . Cervical stenosis of spinal canal 08/06/2014  . Muscle weakness (generalized) 11/12/2013  . Tight fascia 11/12/2013  . Decreased range of motion of shoulder 11/12/2013  . Cervical spondylosis without myelopathy 10/29/2013  . S/P carpal tunnel release 02/19/2013  . CTS (carpal tunnel syndrome) 02/19/2013  . SHOULDER PAIN 10/06/2007  . IMPINGEMENT SYNDROME 10/06/2007  . RUPTURE ROTATOR CUFF 10/06/2007  . HIGH  BLOOD PRESSURE 10/03/2007    Ailene Ravel, OTR/L,CBIS  [phone]  06/10/2015, 1:40 PM  [place] [address] [place], [place], [address] Phone: [phone]   Fax:  [phone]  Name: Kimberly [name] MRN: [id number] Date of Birth: 1961/07/19

## 2015-06-12 ENCOUNTER — Emergency Department (HOSPITAL_COMMUNITY)
Admission: EM | Admit: 2015-06-12 | Discharge: 2015-06-12 | Disposition: A | Discharge status: Home / Self Care | Payer: 59 | Attending: Emergency Medicine | Admitting: Emergency Medicine

## 2015-06-12 ENCOUNTER — Encounter (HOSPITAL_COMMUNITY): Payer: Self-pay | Admitting: Emergency Medicine

## 2015-06-12 DIAGNOSIS — Z8701 Personal history of pneumonia (recurrent): Secondary | ICD-10-CM | POA: Insufficient documentation

## 2015-06-12 DIAGNOSIS — Z9889 Other specified postprocedural states: Secondary | ICD-10-CM | POA: Insufficient documentation

## 2015-06-12 DIAGNOSIS — R079 Chest pain, unspecified: Secondary | ICD-10-CM | POA: Insufficient documentation

## 2015-06-12 DIAGNOSIS — I10 Essential (primary) hypertension: Secondary | ICD-10-CM | POA: Insufficient documentation

## 2015-06-12 DIAGNOSIS — J449 Chronic obstructive pulmonary disease, unspecified: Secondary | ICD-10-CM | POA: Insufficient documentation

## 2015-06-12 DIAGNOSIS — F419 Anxiety disorder, unspecified: Secondary | ICD-10-CM | POA: Diagnosis not present

## 2015-06-12 DIAGNOSIS — K219 Gastro-esophageal reflux disease without esophagitis: Secondary | ICD-10-CM | POA: Diagnosis not present

## 2015-06-12 DIAGNOSIS — Z7982 Long term (current) use of aspirin: Secondary | ICD-10-CM | POA: Diagnosis not present

## 2015-06-12 DIAGNOSIS — Z87442 Personal history of urinary calculi: Secondary | ICD-10-CM | POA: Diagnosis not present

## 2015-06-12 DIAGNOSIS — F329 Major depressive disorder, single episode, unspecified: Secondary | ICD-10-CM | POA: Insufficient documentation

## 2015-06-12 DIAGNOSIS — Z7951 Long term (current) use of inhaled steroids: Secondary | ICD-10-CM | POA: Insufficient documentation

## 2015-06-12 DIAGNOSIS — Z90710 Acquired absence of both cervix and uterus: Secondary | ICD-10-CM | POA: Insufficient documentation

## 2015-06-12 DIAGNOSIS — M1990 Unspecified osteoarthritis, unspecified site: Secondary | ICD-10-CM | POA: Diagnosis not present

## 2015-06-12 DIAGNOSIS — Z79899 Other long term (current) drug therapy: Secondary | ICD-10-CM | POA: Diagnosis not present

## 2015-06-12 DIAGNOSIS — Z9181 History of falling: Secondary | ICD-10-CM | POA: Diagnosis not present

## 2015-06-12 DIAGNOSIS — K8050 Calculus of bile duct without cholangitis or cholecystitis without obstruction: Secondary | ICD-10-CM | POA: Diagnosis not present

## 2015-06-12 DIAGNOSIS — Z8619 Personal history of other infectious and parasitic diseases: Secondary | ICD-10-CM | POA: Insufficient documentation

## 2015-06-12 DIAGNOSIS — R1110 Vomiting, unspecified: Secondary | ICD-10-CM | POA: Diagnosis present

## 2015-06-12 LAB — COMPREHENSIVE METABOLIC PANEL
ALBUMIN: 4 g/dL (ref 3.5–5.0)
ALT: 28 U/L (ref 14–54)
ANION GAP: 14 (ref 5–15)
AST: 74 U/L — AB (ref 15–41)
Alkaline Phosphatase: 107 U/L (ref 38–126)
BUN: 7 mg/dL (ref 6–20)
CHLORIDE: 104 mmol/L (ref 101–111)
CO2: 23 mmol/L (ref 22–32)
Calcium: 9.2 mg/dL (ref 8.9–10.3)
Creatinine, Ser: 0.58 mg/dL (ref 0.44–1.00)
GFR calc Af Amer: >60 mL/min (ref >60)
GFR calc non Af Amer: >60 mL/min (ref >60)
GLUCOSE: 98 mg/dL (ref 65–99)
POTASSIUM: 3.3 mmol/L — AB (ref 3.5–5.1)
Sodium: 141 mmol/L (ref 135–145)
TOTAL PROTEIN: 7.8 g/dL (ref 6.5–8.1)
Total Bilirubin: 0.8 mg/dL (ref 0.3–1.2)

## 2015-06-12 LAB — URINALYSIS, ROUTINE W REFLEX MICROSCOPIC
BILIRUBIN URINE: NEGATIVE
Glucose, UA: NEGATIVE mg/dL
Ketones, ur: NEGATIVE mg/dL
Leukocytes, UA: NEGATIVE
NITRITE: NEGATIVE
PH: 7 (ref 5.0–8.0)
Protein, ur: NEGATIVE mg/dL
SPECIFIC GRAVITY, URINE: 1.01 (ref 1.005–1.030)
UROBILINOGEN UA: 0.2 mg/dL (ref 0.0–1.0)

## 2015-06-12 LAB — CBC
HEMATOCRIT: 42.1 % (ref 36.0–46.0)
HEMOGLOBIN: 13.8 g/dL (ref 12.0–15.0)
MCH: 30.7 pg (ref 26.0–34.0)
MCHC: 32.8 g/dL (ref 30.0–36.0)
MCV: 93.8 fL (ref 78.0–100.0)
Platelets: 246 10*3/uL (ref 150–400)
RBC: 4.49 MIL/uL (ref 3.87–5.11)
RDW: 12.6 % (ref 11.5–15.5)
WBC: 15 10*3/uL — AB (ref 4.0–10.5)

## 2015-06-12 LAB — URINE MICROSCOPIC-ADD ON

## 2015-06-12 LAB — LIPASE, BLOOD: Lipase: 19 U/L (ref 11–51)

## 2015-06-12 MED ORDER — ONDANSETRON 8 MG PO TBDP: 8.0000 mg | ORAL_TABLET | Freq: Three times a day (TID) | ORAL | Status: DC | PRN

## 2015-06-12 MED ORDER — SODIUM CHLORIDE 0.9 % IV SOLN
1000.0000 mL | INTRAVENOUS | Status: DC
  Administered 2015-06-12: 1000 mL via INTRAVENOUS

## 2015-06-12 MED ORDER — HYDROCODONE-ACETAMINOPHEN 5-325 MG PO TABS: 1.0000 | ORAL_TABLET | Freq: Four times a day (QID) | ORAL | Status: DC | PRN

## 2015-06-12 MED ORDER — HYDROMORPHONE HCL 1 MG/ML IJ SOLN
1.0000 mg | INTRAMUSCULAR | Status: DC | PRN
  Administered 2015-06-12: 1 mg via INTRAVENOUS
  Filled 2015-06-12: qty 1

## 2015-06-12 MED ORDER — ONDANSETRON HCL 4 MG/2ML IJ SOLN
4.0000 mg | Freq: Once | INTRAMUSCULAR | Status: AC
  Administered 2015-06-12: 4 mg via INTRAVENOUS
  Filled 2015-06-12: qty 2

## 2015-06-12 MED ORDER — SODIUM CHLORIDE 0.9 % IV SOLN
1000.0000 mL | Freq: Once | INTRAVENOUS | Status: AC
Start: 2015-06-12 — End: 2015-06-12
  Administered 2015-06-12: 1000 mL via INTRAVENOUS

## 2015-06-12 NOTE — Discharge Instructions (Signed)
Cholelithiasis °Cholelithiasis (also called gallstones) is a form of gallbladder disease. The gallbladder is a small organ that helps you digest fats. Symptoms of gallstones are: °· Feeling sick to your stomach (nausea). °· Throwing up (vomiting). °· Belly pain. °· Yellowing of the skin (jaundice). °· Sudden pain. You may feel the pain for minutes to hours. °· Fever. °· Pain to the touch. °HOME CARE °· Only take medicines as told by your doctor. °· Eat a low-fat diet until you see your doctor again. Eating fat can result in pain. °· Follow up with your doctor as told. Attacks usually happen time after time. Surgery is usually needed for permanent treatment. °GET HELP RIGHT AWAY IF:  °· Your pain gets worse. °· Your pain is not helped by medicines. °· You have a fever and lasting symptoms for more than 2-3 days. °· You have a fever and your symptoms suddenly get worse. °· You keep feeling sick to your stomach and throwing up. °MAKE SURE YOU:  °· Understand these instructions. °· Will watch your condition. °· Will get help right away if you are not doing well or get worse. °  °This information is not intended to replace advice given to you by your health care provider. Make sure you discuss any questions you have with your health care provider. °  °Document Released: 01/02/2008 Document Revised: 03/18/2013 Document Reviewed: 01/07/2013 °Elsevier Interactive Patient Education ©2016 Elsevier Inc. ° °

## 2015-06-12 NOTE — ED Notes (Signed)
Discharge instructions given, pt demonstrated teach back and verbal understanding. No concerns voiced.  

## 2015-06-12 NOTE — ED Notes (Signed)
Pt states after hamburger for dinner, she has been vomiting. Pt has gall stones and waiting for further evaluation with u/s, pt had to have Left shoulder surgery taken care of first

## 2015-06-12 NOTE — ED Provider Notes (Signed)
CSN: [id number]     Arrival date & time 06/12/15  [id number] History  By signing my name below, I, Irene Pap, attest that this documentation has been prepared under the direction and in the presence of Dorie Rank, MD. Electronically Signed: Irene Pap, ED Scribe. 06/12/2015. 10:02 PM.   Chief Complaint  Patient presents with  . Emesis   Patient is a 54 y.o. female presenting with vomiting. The history is provided by the patient. No language interpreter was used.  Emesis Severity:  Moderate Duration:  3 hours Timing:  Constant Quality:  Undigested food Progression:  Worsening Chronicity:  New Recent urination:  Normal Context: not post-tussive and not self-induced   Ineffective treatments:  None tried Associated symptoms: abdominal pain and diarrhea   Risk factors: prior abdominal surgery and suspect food intake   HPI Comments: Kimberly [name] is a 54 y.o. Female with a hx of COPD, HTN, and GERD who presents to the Emergency Department complaining of vomiting onset 3 hours ago. Pt states that she began to vomit after eating a hamburger for dinner tonight. She states that she has gall stones and is waiting for further evaluation with [place]. She reports associated diarrhea, chest pain radiating to the epigastrium, and states that she has filled two bags of emesis. She reports worsening abdominal pain with palpation.She states that she has not been following a low calorie diet for her gallstone diagnoses, causing flare ups. She denies hematochezia and hematemesis.   Past Medical History  Diagnosis Date  . COPD (chronic obstructive pulmonary disease) ([place])   . Herpes   . Hypertension     pt. doesn't see cardiologist, followed for HTN by Dr. Gerarda Fraction  . Shortness of breath dyspnea   . Anxiety     takes alprazolam - rare use   . GERD (gastroesophageal reflux disease)     pt. reports that its better, no meds in use at this time- 2015  . Arthritis     cervical spondylosis   . PONV  (postoperative nausea and vomiting)   . History of pneumonia   . Chronic bronchitis with COPD (chronic obstructive pulmonary disease) ([place])   . Brain cyst   . Recurrent falls   . Balance problem   . Urinary frequency   . History of kidney stones   . Inflammation of shoulder joint   . Depression   . Difficult intubation     Small mouth opening, limited neck flexion, very anterior    Past Surgical History  Procedure Laterality Date  . Abdominal hysterectomy    . Rotator cuff repair Left   . Foreign body removal Left     knee-as child  . Carpal tunnel release Left 02/16/2013    Procedure: CARPAL TUNNEL RELEASE;  Surgeon: Carole Civil, MD;  Location: [facility];  Service: Orthopedics;  Laterality: Left;  . Carpal tunnel release Right 03/27/2013    Procedure: RIGHT CARPAL TUNNEL RELEASE;  Surgeon: Carole Civil, MD;  Location: [facility];  Service: Orthopedics;  Laterality: Right;  . Cesarean section      x2  . Anterior cervical decomp/discectomy fusion N/A 08/06/2014    Procedure: ANTERIOR CERVICAL DECOMPRESSION/DISCECTOMY FUSION CERVICAL THREE-FOUR,CERVICAL SIX-SEVEN ,CERVICAL SEVEN-THORACIC ONE;  Surgeon: Floyce Stakes, MD;  Location: [place];  Service: Neurosurgery;  Laterality: N/A;  . Colonoscopy    . Nasal sinus surgery N/A 04/13/2015    Procedure: nasal endoscopy with adenoid biopsy;  Surgeon: Ruby Cola, MD;  Location: [place];  Service:  ENT;  Laterality: N/A;  . Shoulder arthroscopy with distal clavicle resection Left 05/18/2015    Procedure: LEFT SHOULDER ARTHROSCOPY WITH  DISTAL CLAVICLE RESECTION;  Surgeon: Earlie Server, MD;  Location: [place];  Service: Orthopedics;  Laterality: Left;  . Shoulder acromioplasty Left 05/18/2015    Procedure: SHOULDER ACROMIOPLASTY;  Surgeon: Earlie Server, MD;  Location: [place];  Service: Orthopedics;  Laterality: Left;   No family history on file. Social History  Substance Use Topics  . Smoking  status: Never Smoker   . Smokeless tobacco: Never Used  . Alcohol Use: No   OB History    No data available     Review of Systems  Cardiovascular: Positive for chest pain (post-tussive pain).  Gastrointestinal: Positive for vomiting, abdominal pain and diarrhea. Negative for blood in stool.  All other systems reviewed and are negative.  Allergies  Peanut-containing drug products; Shellfish allergy; and Codeine  Home Medications   Prior to Admission medications   Medication Sig Start Date End Date Taking? Authorizing Provider  acyclovir (ZOVIRAX) 400 MG tablet Take 400 mg by mouth daily.    Yes Historical Provider, MD  amLODipine (NORVASC) 5 MG tablet Take 5 mg by mouth daily with breakfast.    Yes Historical Provider, MD  aspirin EC 81 MG tablet Take 81 mg by mouth daily.   Yes Historical Provider, MD  budesonide-formoterol (SYMBICORT) 160-4.5 MCG/ACT inhaler Inhale 2 puffs into the lungs 2 (two) times daily.   Yes Historical Provider, MD  diazepam (VALIUM) 5 MG tablet Take 5 mg by mouth every 6 (six) hours as needed for anxiety.   Yes Historical Provider, MD  escitalopram (LEXAPRO) 10 MG tablet Take 10 mg by mouth daily.   Yes Historical Provider, MD  estradiol (ESTRACE) 1 MG tablet Take 1 mg by mouth daily with breakfast.    Yes Historical Provider, MD  gabapentin (NEURONTIN) 100 MG capsule TWO CAPSULES BY MOUTH EVERY 8 HOURS Patient taking differently: Take 200 mg by mouth every 8 (eight) hours.  01/04/15  Yes Carole Civil, MD  ibuprofen (ADVIL,MOTRIN) 600 MG tablet Take 600 mg by mouth every 8 (eight) hours as needed for mild pain or moderate pain.   Yes Historical Provider, MD  methocarbamol (ROBAXIN) 500 MG tablet Take 1 tablet (500 mg total) by mouth 2 (two) times daily. 01/03/15  Yes Carole Civil, MD  oxybutynin (DITROPAN-XL) 10 MG 24 hr tablet Take 10 mg by mouth at bedtime.    Yes Historical Provider, MD  oxyCODONE-acetaminophen (PERCOCET/ROXICET) 5-325 MG tablet  Take 2 tablets by mouth every 4 (four) hours as needed for severe pain.   Yes Historical Provider, MD  pantoprazole (PROTONIX) 40 MG tablet Take 40 mg by mouth daily.   Yes Historical Provider, MD  HYDROcodone-acetaminophen (NORCO/VICODIN) 5-325 MG tablet Take 1 tablet by mouth every 6 (six) hours as needed. 06/12/15   Dorie Rank, MD  ondansetron (ZOFRAN ODT) 8 MG disintegrating tablet Take 1 tablet (8 mg total) by mouth every 8 (eight) hours as needed for nausea or vomiting. 06/12/15   Dorie Rank, MD   BP 134/84 mmHg  Pulse 102  Temp(Src) 98.6 F (37 C) (Oral)  Resp 20  Ht 5\' 1"  (1.549 m)  Wt 144 lb (65.318 kg)  BMI 27.22 kg/m2  SpO2 100% Physical Exam  Constitutional: She appears well-developed and well-nourished. No distress.  HENT:  Head: Normocephalic and atraumatic.  Right Ear: External ear normal.  Left Ear: External ear  normal.  Eyes: Conjunctivae are normal. Right eye exhibits no discharge. Left eye exhibits no discharge. No scleral icterus.  Neck: Neck supple. No tracheal deviation present.  Cardiovascular: Normal rate, regular rhythm and intact distal pulses.   Pulmonary/Chest: Effort normal and breath sounds normal. No stridor. No respiratory distress. She has no wheezes. She has no rales.  Abdominal: Soft. Bowel sounds are normal. She exhibits no distension. There is tenderness in the epigastric area. There is no rigidity, no rebound and no guarding. No hernia.  Musculoskeletal: She exhibits no edema or tenderness.  Neurological: She is alert. She has normal strength. No cranial nerve deficit (no facial droop, extraocular movements intact, no slurred speech) or sensory deficit. She exhibits normal muscle tone. She displays no seizure activity. Coordination normal.  Skin: Skin is warm and dry. No rash noted.  Psychiatric: She has a normal mood and affect.  Nursing note and vitals reviewed.   ED Course  Procedures (including critical care time) DIAGNOSTIC STUDIES: Oxygen  Saturation is 100% on RA, normal by my interpretation.    COORDINATION OF CARE: 8:35 PM-Discussed treatment plan which includes labs, pain and nausea medicatin with pt at bedside and pt agreed to plan.   Labs Review Labs Reviewed  COMPREHENSIVE METABOLIC PANEL - Abnormal; Notable for the following:    Potassium 3.3 (*)    AST 74 (*)    All other components within normal limits  CBC - Abnormal; Notable for the following:    WBC 15.0 (*)    All other components within normal limits  URINALYSIS, ROUTINE W REFLEX MICROSCOPIC (NOT AT [facility]) - Abnormal; Notable for the following:    Hgb urine dipstick SMALL (*)    All other components within normal limits  URINE MICROSCOPIC-ADD ON - Abnormal; Notable for the following:    Squamous Epithelial / LPF FEW (*)    Bacteria, UA FEW (*)    All other components within normal limits  LIPASE, BLOOD    I have personally reviewed and evaluated these  lab results as part of my medical decision-making.  2240  abdominal exam repeated. Patient has no abdominal tenderness.  Symptoms have resolved MDM   Final diagnoses:  Biliary colic   Patient's symptoms are suggestive of biliary colic. She does have an elevated white blood cell count but otherwise her laboratory tests are unremarkable. Her symptoms have resolved. She has no tenderness on exam. I doubt acute cholecystitis. I recommend the patient follow up with a general surgeon this week. We discussed a low-fat diet. Return for fever or worsening symptoms.   I personally performed the services described in this documentation, which was scribed in my presence.  The recorded information has been reviewed and is accurate.      Dorie Rank, MD 06/12/15 [phone]

## 2015-06-13 ENCOUNTER — Emergency Department (HOSPITAL_COMMUNITY)
Admission: EM | Admit: 2015-06-13 | Discharge: 2015-06-13 | Disposition: A | Discharge status: Home / Self Care | Payer: 59 | Attending: Emergency Medicine | Admitting: Emergency Medicine

## 2015-06-13 ENCOUNTER — Encounter (HOSPITAL_COMMUNITY): Payer: Self-pay | Admitting: *Deleted

## 2015-06-13 DIAGNOSIS — Z79899 Other long term (current) drug therapy: Secondary | ICD-10-CM | POA: Insufficient documentation

## 2015-06-13 DIAGNOSIS — F329 Major depressive disorder, single episode, unspecified: Secondary | ICD-10-CM | POA: Insufficient documentation

## 2015-06-13 DIAGNOSIS — Z8701 Personal history of pneumonia (recurrent): Secondary | ICD-10-CM | POA: Insufficient documentation

## 2015-06-13 DIAGNOSIS — I10 Essential (primary) hypertension: Secondary | ICD-10-CM | POA: Diagnosis not present

## 2015-06-13 DIAGNOSIS — Z90710 Acquired absence of both cervix and uterus: Secondary | ICD-10-CM | POA: Insufficient documentation

## 2015-06-13 DIAGNOSIS — Z8669 Personal history of other diseases of the nervous system and sense organs: Secondary | ICD-10-CM | POA: Insufficient documentation

## 2015-06-13 DIAGNOSIS — M1990 Unspecified osteoarthritis, unspecified site: Secondary | ICD-10-CM | POA: Insufficient documentation

## 2015-06-13 DIAGNOSIS — J449 Chronic obstructive pulmonary disease, unspecified: Secondary | ICD-10-CM | POA: Diagnosis not present

## 2015-06-13 DIAGNOSIS — Z9181 History of falling: Secondary | ICD-10-CM | POA: Insufficient documentation

## 2015-06-13 DIAGNOSIS — R1010 Upper abdominal pain, unspecified: Secondary | ICD-10-CM | POA: Diagnosis present

## 2015-06-13 DIAGNOSIS — Z79818 Long term (current) use of other agents affecting estrogen receptors and estrogen levels: Secondary | ICD-10-CM | POA: Diagnosis not present

## 2015-06-13 DIAGNOSIS — Z87442 Personal history of urinary calculi: Secondary | ICD-10-CM | POA: Diagnosis not present

## 2015-06-13 DIAGNOSIS — K219 Gastro-esophageal reflux disease without esophagitis: Secondary | ICD-10-CM | POA: Diagnosis not present

## 2015-06-13 DIAGNOSIS — Z7951 Long term (current) use of inhaled steroids: Secondary | ICD-10-CM | POA: Insufficient documentation

## 2015-06-13 DIAGNOSIS — F419 Anxiety disorder, unspecified: Secondary | ICD-10-CM | POA: Insufficient documentation

## 2015-06-13 DIAGNOSIS — Z7982 Long term (current) use of aspirin: Secondary | ICD-10-CM | POA: Diagnosis not present

## 2015-06-13 DIAGNOSIS — K8050 Calculus of bile duct without cholangitis or cholecystitis without obstruction: Secondary | ICD-10-CM | POA: Diagnosis not present

## 2015-06-13 DIAGNOSIS — Z9889 Other specified postprocedural states: Secondary | ICD-10-CM | POA: Insufficient documentation

## 2015-06-13 DIAGNOSIS — R1013 Epigastric pain: Secondary | ICD-10-CM

## 2015-06-13 DIAGNOSIS — Z8619 Personal history of other infectious and parasitic diseases: Secondary | ICD-10-CM | POA: Diagnosis not present

## 2015-06-13 MED ORDER — GI COCKTAIL ~~LOC~~
30.0000 mL | Freq: Once | ORAL | Status: AC
  Administered 2015-06-13: 30 mL via ORAL
  Filled 2015-06-13: qty 30

## 2015-06-13 NOTE — Discharge Instructions (Signed)
Abdominal Pain, Adult °Many things can cause abdominal pain. Usually, abdominal pain is not caused by a disease and will improve without treatment. It can often be observed and treated at home. Your health care provider will do a physical exam and possibly order blood tests and X-rays to help determine the seriousness of your pain. However, in many cases, more time must pass before a clear cause of the pain can be found. Before that point, your health care provider may not know if you need more testing or further treatment. °HOME CARE INSTRUCTIONS °Monitor your abdominal pain for any changes. The following actions may help to alleviate any discomfort you are experiencing: °· Only take over-the-counter or prescription medicines as directed by your health care provider. °· Do not take laxatives unless directed to do so by your health care provider. °· Try a clear liquid diet (broth, tea, or water) as directed by your health care provider. Slowly move to a bland diet as tolerated. °SEEK MEDICAL CARE IF: °· You have unexplained abdominal pain. °· You have abdominal pain associated with nausea or diarrhea. °· You have pain when you urinate or have a bowel movement. °· You experience abdominal pain that wakes you in the night. °· You have abdominal pain that is worsened or improved by eating food. °· You have abdominal pain that is worsened with eating fatty foods. °· You have a fever. °SEEK IMMEDIATE MEDICAL CARE IF: °· Your pain does not go away within 2 hours. °· You keep throwing up (vomiting). °· Your pain is felt only in portions of the abdomen, such as the right side or the left lower portion of the abdomen. °· You pass bloody or black tarry stools. °MAKE SURE YOU: °· Understand these instructions. °· Will watch your condition. °· Will get help right away if you are not doing well or get worse. °  °This information is not intended to replace advice given to you by your health care provider. Make sure you discuss  any questions you have with your health care provider. °  °Document Released: 04/25/2005 Document Revised: 04/06/2015 Document Reviewed: 03/25/2013 °Elsevier Interactive Patient Education ©2016 Elsevier Inc. ° °Cholelithiasis °Cholelithiasis (also called gallstones) is a form of gallbladder disease in which gallstones form in your gallbladder. The gallbladder is an organ that stores bile made in the liver, which helps digest fats. Gallstones begin as small crystals and slowly grow into stones. Gallstone pain occurs when the gallbladder spasms and a gallstone is blocking the duct. Pain can also occur when a stone passes out of the duct.  °RISK FACTORS °· Being female.   °· Having multiple pregnancies. Health care providers sometimes advise removing diseased gallbladders before future pregnancies.   °· Being obese. °· Eating a diet heavy in fried foods and fat.   °· Being older than 60 years and increasing age.   °· Prolonged use of medicines containing female hormones.   °· Having diabetes mellitus.   °· Rapidly losing weight.   °· Having a family history of gallstones (heredity).   °SYMPTOMS °· Nausea.   °· Vomiting. °· Abdominal pain.   °· Yellowing of the skin (jaundice).   °· Sudden pain. It may persist from several minutes to several hours. °· Fever.   °· Tenderness to the touch.  °In some cases, when gallstones do not move into the bile duct, people have no pain or symptoms. These are called "silent" gallstones.  °TREATMENT °Silent gallstones do not need treatment. In severe cases, emergency surgery may be required. Options for treatment include: °· Surgery to   remove the gallbladder. This is the most common treatment. °· Medicines. These do not always work and may take 6-12 months or more to work. °· Shock wave treatment (extracorporeal biliary lithotripsy). In this treatment an ultrasound machine sends shock waves to the gallbladder to break gallstones into smaller pieces that can pass into the intestines or  be dissolved by medicine. °HOME CARE INSTRUCTIONS  °· Only take over-the-counter or prescription medicines for pain, discomfort, or fever as directed by your health care provider.   °· Follow a low-fat diet until seen again by your health care provider. Fat causes the gallbladder to contract, which can result in pain.   °· Follow up with your health care provider as directed. Attacks are almost always recurrent and surgery is usually required for permanent treatment.   °SEEK IMMEDIATE MEDICAL CARE IF:  °· Your pain increases and is not controlled by medicines.   °· You have a fever or persistent symptoms for more than 2-3 days.   °· You have a fever and your symptoms suddenly get worse.   °· You have persistent nausea and vomiting.   °MAKE SURE YOU:  °· Understand these instructions. °· Will watch your condition. °· Will get help right away if you are not doing well or get worse. °  °This information is not intended to replace advice given to you by your health care provider. Make sure you discuss any questions you have with your health care provider. °  °Document Released: 07/12/2005 Document Revised: 03/18/2013 Document Reviewed: 01/07/2013 °Elsevier Interactive Patient Education ©2016 Elsevier Inc. ° °

## 2015-06-13 NOTE — ED Provider Notes (Signed)
CSN: [id number]     Arrival date & time 06/13/15  1631 History   First MD Initiated Contact with Patient 06/13/15 1828     Chief Complaint  Patient presents with  . Abdominal Pain     (Consider location/radiation/quality/duration/timing/severity/associated sxs/prior Treatment) Patient is a 54 y.o. female presenting with abdominal pain. The history is provided by the patient.  Abdominal Pain Associated symptoms: nausea and vomiting   Associated symptoms: no chest pain, no diarrhea and no shortness of breath    patient presents with abdominal pain. Is in her upper abdomen. Seen yesterday for same. Diagnosed with biliary colic. She had an ultrasound September that showed gallstones. States she is still been eating fatty foods. States she has pain with it. She states she had not followed up with general surgery because she had other surgery that had to be done. Pain is gotten worse. States this morning she ate something she was not supposed to and the pain began to get worse again. She has follow-up with Dr. Arnoldo Morale, but not until December 6.   Past Medical History  Diagnosis Date  . COPD (chronic obstructive pulmonary disease) ([place])   . Herpes   . Hypertension     pt. doesn't see cardiologist, followed for HTN by Dr. Gerarda Fraction  . Shortness of breath dyspnea   . Anxiety     takes alprazolam - rare use   . GERD (gastroesophageal reflux disease)     pt. reports that its better, no meds in use at this time- 2015  . Arthritis     cervical spondylosis   . PONV (postoperative nausea and vomiting)   . History of pneumonia   . Chronic bronchitis with COPD (chronic obstructive pulmonary disease) ([place])   . Brain cyst   . Recurrent falls   . Balance problem   . Urinary frequency   . History of kidney stones   . Inflammation of shoulder joint   . Depression   . Difficult intubation     Small mouth opening, limited neck flexion, very anterior    Past Surgical History  Procedure Laterality  Date  . Abdominal hysterectomy    . Rotator cuff repair Left   . Foreign body removal Left     knee-as child  . Carpal tunnel release Left 02/16/2013    Procedure: CARPAL TUNNEL RELEASE;  Surgeon: Carole Civil, MD;  Location: [facility];  Service: Orthopedics;  Laterality: Left;  . Carpal tunnel release Right 03/27/2013    Procedure: RIGHT CARPAL TUNNEL RELEASE;  Surgeon: Carole Civil, MD;  Location: [facility];  Service: Orthopedics;  Laterality: Right;  . Cesarean section      x2  . Anterior cervical decomp/discectomy fusion N/A 08/06/2014    Procedure: ANTERIOR CERVICAL DECOMPRESSION/DISCECTOMY FUSION CERVICAL THREE-FOUR,CERVICAL SIX-SEVEN ,CERVICAL SEVEN-THORACIC ONE;  Surgeon: Floyce Stakes, MD;  Location: [place];  Service: Neurosurgery;  Laterality: N/A;  . Colonoscopy    . Nasal sinus surgery N/A 04/13/2015    Procedure: nasal endoscopy with adenoid biopsy;  Surgeon: Ruby Cola, MD;  Location: [place];  Service: ENT;  Laterality: N/A;  . Shoulder arthroscopy with distal clavicle resection Left 05/18/2015    Procedure: LEFT SHOULDER ARTHROSCOPY WITH  DISTAL CLAVICLE RESECTION;  Surgeon: Earlie Server, MD;  Location: [place];  Service: Orthopedics;  Laterality: Left;  . Shoulder acromioplasty Left 05/18/2015    Procedure: SHOULDER ACROMIOPLASTY;  Surgeon: Earlie Server, MD;  Location: [place];  Service: Orthopedics;  Laterality: Left;   History reviewed. No pertinent family history. Social History  Substance Use Topics  . Smoking status: Never Smoker   . Smokeless tobacco: Never Used  . Alcohol Use: No   OB History    No data available     Review of Systems  Constitutional: Negative for activity change and appetite change.  Eyes: Negative for pain.  Respiratory: Negative for chest tightness and shortness of breath.   Cardiovascular: Negative for chest pain and leg swelling.  Gastrointestinal: Positive for nausea, vomiting and abdominal  pain. Negative for diarrhea.  Genitourinary: Negative for flank pain.  Musculoskeletal: Negative for back pain and neck stiffness.  Skin: Negative for rash.  Neurological: Negative for weakness, numbness and headaches.  Psychiatric/Behavioral: Negative for behavioral problems.      Allergies  Peanut-containing drug products; Shellfish allergy; and Codeine  Home Medications   Prior to Admission medications   Medication Sig Start Date End Date Taking? Authorizing Provider  acyclovir (ZOVIRAX) 400 MG tablet Take 400 mg by mouth daily.    Yes Historical Provider, MD  amLODipine (NORVASC) 5 MG tablet Take 5 mg by mouth daily with breakfast.    Yes Historical Provider, MD  aspirin EC 81 MG tablet Take 81 mg by mouth daily.   Yes Historical Provider, MD  budesonide-formoterol (SYMBICORT) 160-4.5 MCG/ACT inhaler Inhale 2 puffs into the lungs 2 (two) times daily.   Yes Historical Provider, MD  diazepam (VALIUM) 5 MG tablet Take 5 mg by mouth every 6 (six) hours as needed for anxiety.   Yes Historical Provider, MD  escitalopram (LEXAPRO) 10 MG tablet Take 10 mg by mouth daily.   Yes Historical Provider, MD  estradiol (ESTRACE) 1 MG tablet Take 1 mg by mouth daily with breakfast.    Yes Historical Provider, MD  gabapentin (NEURONTIN) 100 MG capsule TWO CAPSULES BY MOUTH EVERY 8 HOURS Patient taking differently: Take 200 mg by mouth every 8 (eight) hours.  01/04/15  Yes Carole Civil, MD  HYDROcodone-acetaminophen (NORCO/VICODIN) 5-325 MG tablet Take 1 tablet by mouth every 6 (six) hours as needed. 06/12/15  Yes Dorie Rank, MD  ibuprofen (ADVIL,MOTRIN) 600 MG tablet Take 600 mg by mouth every 8 (eight) hours as needed for mild pain or moderate pain.   Yes Historical Provider, MD  methocarbamol (ROBAXIN) 500 MG tablet Take 1 tablet (500 mg total) by mouth 2 (two) times daily. 01/03/15  Yes Carole Civil, MD  ondansetron (ZOFRAN ODT) 8 MG disintegrating tablet Take 1 tablet (8 mg total) by mouth  every 8 (eight) hours as needed for nausea or vomiting. 06/12/15  Yes Dorie Rank, MD  oxybutynin (DITROPAN-XL) 10 MG 24 hr tablet Take 10 mg by mouth at bedtime.    Yes Historical Provider, MD  pantoprazole (PROTONIX) 40 MG tablet Take 40 mg by mouth daily.   Yes Historical Provider, MD   BP 122/79 mmHg  Pulse 83  Temp(Src) 98.7 F (37.1 C) (Oral)  Resp 20  SpO2 100% Physical Exam  Constitutional: She appears well-developed and well-nourished.  HENT:  Head: Atraumatic.  Neck: Neck supple.  Cardiovascular: Normal rate.   Pulmonary/Chest: Effort normal.  Abdominal: Soft. There is tenderness.  Mild mid abdominal tenderness without rebound or guarding. No real right upper quadrant tenderness.  Neurological: She is alert.  Skin: Skin is warm.    ED Course  Procedures (including critical care time) Labs Review Labs Reviewed - No data to display  Imaging Review No results found. I have  personally reviewed and evaluated these images and lab results as part of my medical decision-making.   EKG Interpretation None      MDM   Final diagnoses:  Epigastric pain  Biliary colic    Patient with epigastric pain. History of gallstones. Has not followed up with general surgery. Rather benign exam and blood work done last night reassuring. Discussed with Dr. Arnoldo Morale, who will see the patient in follow-up.    Davonna Belling, MD 06/14/15 2225

## 2015-06-13 NOTE — ED Notes (Signed)
Patient reports abd pain with n/v, seen here last night for same, told she had gallstones but can't see Dr. Arnoldo Morale until Dec 6th. Pt c/o not being able to keep anything down.

## 2015-06-15 ENCOUNTER — Ambulatory Visit (HOSPITAL_COMMUNITY): Payer: 59

## 2015-06-15 NOTE — Patient Instructions (Signed)
Kimberly [name]  06/15/2015     @PREFPERIOPPHARMACY @   Your procedure is scheduled on 06/17/2015.  Report to [facility] at 10:00 A.M.  Call this number if you have problems the morning of surgery:  [phone]   Remember:  Do not eat food or drink liquids after midnight.  Take these medicines the morning of surgery with A SIP OF WATER Robaxin, Zofran, Ditropan, Protonix, Amlodipine, Symbicort, Valium, Lexapro, Estrace, Gabapentin, Hydrocodone   Do not wear jewelry, make-up or nail polish.  Do not wear lotions, powders, or perfumes.  You may wear deodorant.  Do not shave 48 hours prior to surgery.  Men may shave face and neck.  Do not bring valuables to the hospital.  [facility] is not responsible for any belongings or valuables.  Contacts, dentures or bridgework may not be worn into surgery.  Leave your suitcase in the car.  After surgery it may be brought to your room.  For patients admitted to the hospital, discharge time will be determined by your treatment team.  Patients discharged the day of surgery will not be allowed to drive home.    Please read over the following fact sheets that you were given. Surgical Site Infection Prevention and Anesthesia Post-op Instructions    PATIENT INSTRUCTIONS POST-ANESTHESIA  IMMEDIATELY FOLLOWING SURGERY:  Do not drive or operate machinery for the first twenty four hours after surgery.  Do not make any important decisions for twenty four hours after surgery or while taking narcotic pain medications or sedatives.  If you develop intractable nausea and vomiting or a severe headache please notify your doctor immediately.  FOLLOW-UP:  Please make an appointment with your surgeon as instructed. You do not need to follow up with anesthesia unless specifically instructed to do so.  WOUND CARE INSTRUCTIONS (if applicable):  Keep a dry clean dressing on the anesthesia/puncture wound site if there is drainage.  Once the wound has quit  draining you may leave it open to air.  Generally you should leave the bandage intact for twenty four hours unless there is drainage.  If the epidural site drains for more than 36-48 hours please call the anesthesia department.  QUESTIONS?:  Please feel free to call your physician or the hospital operator if you have any questions, and they will be happy to assist you.      Laparoscopic Cholecystectomy Laparoscopic cholecystectomy is surgery to remove the gallbladder. The gallbladder is located in the upper right part of the abdomen, behind the liver. It is a storage sac for bile, which is produced in the liver. Bile aids in the digestion and absorption of fats. Cholecystectomy is often done for inflammation of the gallbladder (cholecystitis). This condition is usually caused by a buildup of gallstones (cholelithiasis) in the gallbladder. Gallstones can block the flow of bile, and that can result in inflammation and pain. In severe cases, emergency surgery may be required. If emergency surgery is not required, you will have time to prepare for the procedure. Laparoscopic surgery is an alternative to open surgery. Laparoscopic surgery has a shorter recovery time. Your common bile duct may also need to be examined during the procedure. If stones are found in the common bile duct, they may be removed. LET [facility] CARE PROVIDER KNOW ABOUT:  Any allergies you have.  All medicines you are taking, including vitamins, herbs, eye drops, creams, and over-the-counter medicines.  Previous problems you or members of your family have had with the use of anesthetics.  Any blood disorders you have.  Previous surgeries you have had.  Any medical conditions you have. RISKS AND COMPLICATIONS Generally, this is a safe procedure. However, problems may occur, including:  Infection.  Bleeding.  Allergic reactions to medicines.  Damage to other structures or organs.  A stone remaining in the common bile  duct.  A bile leak from the cyst duct that is clipped when your gallbladder is removed.  The need to convert to open surgery, which requires a larger incision in the abdomen. This may be necessary if your surgeon thinks that it is not safe to continue with a laparoscopic procedure. BEFORE THE PROCEDURE  Ask your health care provider about:  Changing or stopping your regular medicines. This is especially important if you are taking diabetes medicines or blood thinners.  Taking medicines such as aspirin and ibuprofen. These medicines can thin your blood. Do not take these medicines before your procedure if your health care provider instructs you not to.  Follow instructions from your health care provider about eating or drinking restrictions.  Let your health care provider know if you develop a cold or an infection before surgery.  Plan to have someone take you home after the procedure.  Ask your health care provider how your surgical site will be marked or identified.  You may be given antibiotic medicine to help prevent infection. PROCEDURE  To reduce your risk of infection:  Your health care team will wash or sanitize their hands.  Your skin will be washed with soap.  An IV tube may be inserted into one of your veins.  You will be given a medicine to make you fall asleep (general anesthetic).  A breathing tube will be placed in your mouth.  The surgeon will make several small cuts (incisions) in your abdomen.  A thin, lighted tube (laparoscope) that has a tiny camera on the end will be inserted through one of the small incisions. The camera on the laparoscope will send a picture to a TV screen (monitor) in the operating room. This will give the surgeon a good view inside your abdomen.  A gas will be pumped into your abdomen. This will expand your abdomen to give the surgeon more room to perform the surgery.  Other tools that are needed for the procedure will be inserted  through the other incisions. The gallbladder will be removed through one of the incisions.  After your gallbladder has been removed, the incisions will be closed with stitches (sutures), staples, or skin glue.  Your incisions may be covered with a bandage (dressing). The procedure may vary among health care providers and hospitals. AFTER THE PROCEDURE  Your blood pressure, heart rate, breathing rate, and blood oxygen level will be monitored often until the medicines you were given have worn off.  You will be given medicines as needed to control your pain.   This information is not intended to replace advice given to you by your health care provider. Make sure you discuss any questions you have with your health care provider.   Document Released: 07/16/2005 Document Revised: 04/06/2015 Document Reviewed: 02/25/2013 Elsevier Interactive Patient Education [employer].

## 2015-06-15 NOTE — H&P (Signed)
  NTS SOAP Note  Vital Signs:  Vitals as of: [id number]: Systolic [id number]: Diastolic 90: Heart Rate 90: Temp 97.55F: Height 41ft 1in: Weight 144Lbs 0 Ounces: Pain Level 2: BMI 27.21  BMI : 27.21 kg/m2  Subjective: This 54 year old female presents for of abdominal pain.  Has had intermittent episodes of right upper quadrant abdominal pain with nausea.  Made worse with fatty food.  No fever, chills, jaundice.  U/S of gallbladder shows cholelithiasis, 68mm common bile duct.  LFT's wnl.  Seen in ER last night and last month.  Review of Symptoms:  Constitutional:unremarkable   Head:unremarkable Eyes:blurred vision bilateral Nose/Mouth/Throat:unremarkable Cardiovascular:  unremarkable Respiratory:dyspnea Gastrointestinabdominal pain, nausea, heartburn Genitourinary:frequency joint, back, and neck pain Skin:unremarkable Hematolgic/Lymphatic:unremarkable   Allergic/Immunologic:unremarkable   Past Medical History:  Reviewed  Past Medical History  Surgical History: neck, throat, shoulder, hand surgeries, TAH Medical Problems: HTN, asthma Allergies: peanuts, shellfish, codeine Medications: norvasc, estroil, acycylovir, symbicort, hydrocodone   Social History:Reviewed  Social History  Preferred Language: English Race:  Black or African American Ethnicity: Not Hispanic / Latino Age: 14 year Marital Status:  S Alcohol: ?   Smoking Status: Never smoker reviewed on 06/14/2015 Functional Status reviewed on 06/14/2015 ------------------------------------------------ Bathing: Normal Cooking: Normal Dressing: Normal Driving: Normal Eating: Normal Managing Meds: Normal Oral Care: Normal Shopping: Normal Toileting: Normal Transferring: Normal Walking: Normal Cognitive Status reviewed on 06/14/2015 ------------------------------------------------ Attention: Normal Decision Making: Normal Language: Normal Memory: Normal Motor: Normal Perception: Normal Problem  Solving: Normal Visual and Spatial: Normal   Family History:Reviewed  Family Health History Family History is Unknown    Objective Information: General:Well appearing, well nourished in no distress. no scleral icterus Neck:Supple without lymphadenopathy.  Heart:RRR, no murmur or gallop.  Normal S1, S2.  No S3, S4.  Lungs:  CTA bilaterally, no wheezes, rhonchi, rales.  Breathing unlabored. Abdomen:Soft, slightly tender in right upper quadrant to palpation, ND, no HSM, no masses.  Assessment:Biliary colic, cholelithiasis  Diagnoses: 574.00  K80.00 Calculus of gallbladder with acute cholecystitis (Calculus of gallbladder with acute cholecystitis without obstruction)  Procedures: [id number] - OFFICE OUTPATIENT NEW 30 MINUTES    Plan:  Scheduled for laparoscopic cholecystectomy on 06/17/15.   Patient Education:Alternative treatments to surgery were discussed with patient (and family).  Risks and benefits  of procedure including bleeding, infection, hepatobiliary injury, and the possibility of an open procedure were fully explained to the patient (and family) who gave informed consent. Patient/family questions were addressed.  Follow-up:Pending Surgery

## 2015-06-16 ENCOUNTER — Encounter (HOSPITAL_COMMUNITY)
Admission: RE | Admit: 2015-06-16 | Discharge: 2015-06-16 | Disposition: A | Discharge status: Home / Self Care | Payer: 59 | Source: Ambulatory Visit | Attending: General Surgery | Admitting: General Surgery

## 2015-06-16 ENCOUNTER — Encounter (HOSPITAL_COMMUNITY): Payer: Self-pay

## 2015-06-16 DIAGNOSIS — J449 Chronic obstructive pulmonary disease, unspecified: Secondary | ICD-10-CM | POA: Diagnosis not present

## 2015-06-16 DIAGNOSIS — M1990 Unspecified osteoarthritis, unspecified site: Secondary | ICD-10-CM | POA: Diagnosis not present

## 2015-06-16 DIAGNOSIS — J45909 Unspecified asthma, uncomplicated: Secondary | ICD-10-CM | POA: Diagnosis not present

## 2015-06-16 DIAGNOSIS — I10 Essential (primary) hypertension: Secondary | ICD-10-CM | POA: Diagnosis not present

## 2015-06-16 DIAGNOSIS — Z79899 Other long term (current) drug therapy: Secondary | ICD-10-CM | POA: Diagnosis not present

## 2015-06-16 DIAGNOSIS — Z79891 Long term (current) use of opiate analgesic: Secondary | ICD-10-CM | POA: Diagnosis not present

## 2015-06-16 DIAGNOSIS — K219 Gastro-esophageal reflux disease without esophagitis: Secondary | ICD-10-CM | POA: Diagnosis not present

## 2015-06-16 DIAGNOSIS — Z7951 Long term (current) use of inhaled steroids: Secondary | ICD-10-CM | POA: Diagnosis not present

## 2015-06-16 DIAGNOSIS — K8010 Calculus of gallbladder with chronic cholecystitis without obstruction: Secondary | ICD-10-CM | POA: Diagnosis present

## 2015-06-16 LAB — CBC
HEMATOCRIT: 41.8 % (ref 36.0–46.0)
HEMOGLOBIN: 13.8 g/dL (ref 12.0–15.0)
MCH: 30.9 pg (ref 26.0–34.0)
MCHC: 33 g/dL (ref 30.0–36.0)
MCV: 93.7 fL (ref 78.0–100.0)
Platelets: 228 10*3/uL (ref 150–400)
RBC: 4.46 MIL/uL (ref 3.87–5.11)
RDW: 12 % (ref 11.5–15.5)
WBC: 6.2 10*3/uL (ref 4.0–10.5)

## 2015-06-16 LAB — BASIC METABOLIC PANEL
ANION GAP: 9 (ref 5–15)
BUN: 9 mg/dL (ref 6–20)
CHLORIDE: 102 mmol/L (ref 101–111)
CO2: 29 mmol/L (ref 22–32)
CREATININE: 0.65 mg/dL (ref 0.44–1.00)
Calcium: 9.5 mg/dL (ref 8.9–10.3)
GFR calc non Af Amer: >60 mL/min (ref >60)
Glucose, Bld: 98 mg/dL (ref 65–99)
POTASSIUM: 3.3 mmol/L — AB (ref 3.5–5.1)
SODIUM: 140 mmol/L (ref 135–145)

## 2015-06-17 ENCOUNTER — Ambulatory Visit (HOSPITAL_COMMUNITY): Payer: 59 | Admitting: Anesthesiology

## 2015-06-17 ENCOUNTER — Ambulatory Visit (HOSPITAL_COMMUNITY)
Admission: RE | Admit: 2015-06-17 | Discharge: 2015-06-17 | Disposition: A | Discharge status: Home / Self Care | Payer: 59 | Source: Ambulatory Visit | Attending: General Surgery | Admitting: General Surgery

## 2015-06-17 ENCOUNTER — Encounter (HOSPITAL_COMMUNITY): Payer: Self-pay | Admitting: *Deleted

## 2015-06-17 ENCOUNTER — Encounter (HOSPITAL_COMMUNITY): Payer: 59

## 2015-06-17 ENCOUNTER — Encounter (HOSPITAL_COMMUNITY)
Admission: RE | Disposition: A | Discharge status: Home / Self Care | Payer: Self-pay | Source: Ambulatory Visit | Attending: General Surgery

## 2015-06-17 DIAGNOSIS — Z79891 Long term (current) use of opiate analgesic: Secondary | ICD-10-CM | POA: Insufficient documentation

## 2015-06-17 DIAGNOSIS — M1990 Unspecified osteoarthritis, unspecified site: Secondary | ICD-10-CM | POA: Insufficient documentation

## 2015-06-17 DIAGNOSIS — J45909 Unspecified asthma, uncomplicated: Secondary | ICD-10-CM | POA: Insufficient documentation

## 2015-06-17 DIAGNOSIS — K8010 Calculus of gallbladder with chronic cholecystitis without obstruction: Secondary | ICD-10-CM | POA: Diagnosis not present

## 2015-06-17 DIAGNOSIS — Z7951 Long term (current) use of inhaled steroids: Secondary | ICD-10-CM | POA: Insufficient documentation

## 2015-06-17 DIAGNOSIS — K219 Gastro-esophageal reflux disease without esophagitis: Secondary | ICD-10-CM | POA: Insufficient documentation

## 2015-06-17 DIAGNOSIS — I10 Essential (primary) hypertension: Secondary | ICD-10-CM | POA: Insufficient documentation

## 2015-06-17 DIAGNOSIS — J449 Chronic obstructive pulmonary disease, unspecified: Secondary | ICD-10-CM | POA: Insufficient documentation

## 2015-06-17 DIAGNOSIS — Z79899 Other long term (current) drug therapy: Secondary | ICD-10-CM | POA: Insufficient documentation

## 2015-06-17 HISTORY — PX: CHOLECYSTECTOMY: SHX55

## 2015-06-17 SURGERY — LAPAROSCOPIC CHOLECYSTECTOMY
Anesthesia: General

## 2015-06-17 MED ORDER — ALBUTEROL SULFATE (2.5 MG/3ML) 0.083% IN NEBU
2.5000 mg | INHALATION_SOLUTION | Freq: Once | RESPIRATORY_TRACT | Status: AC
  Administered 2015-06-17: 2.5 mg via RESPIRATORY_TRACT

## 2015-06-17 MED ORDER — SODIUM CHLORIDE 0.9 % IR SOLN
Status: DC | PRN
  Administered 2015-06-17: 1000 mL

## 2015-06-17 MED ORDER — KETOROLAC TROMETHAMINE 30 MG/ML IJ SOLN
30.0000 mg | Freq: Once | INTRAMUSCULAR | Status: AC
  Administered 2015-06-17: 30 mg via INTRAVENOUS
  Filled 2015-06-17: qty 1

## 2015-06-17 MED ORDER — LIDOCAINE HCL (CARDIAC) 20 MG/ML IV SOLN
INTRAVENOUS | Status: DC | PRN
  Administered 2015-06-17: 50 mg via INTRAVENOUS

## 2015-06-17 MED ORDER — HEMOSTATIC AGENTS (NO CHARGE) OPTIME
TOPICAL | Status: DC | PRN
  Administered 2015-06-17: 1 via TOPICAL

## 2015-06-17 MED ORDER — FENTANYL CITRATE (PF) 100 MCG/2ML IJ SOLN
INTRAMUSCULAR | Status: DC | PRN
  Administered 2015-06-17: 100 ug via INTRAVENOUS

## 2015-06-17 MED ORDER — PROPOFOL 10 MG/ML IV BOLUS
INTRAVENOUS | Status: AC
  Filled 2015-06-17: qty 20

## 2015-06-17 MED ORDER — ONDANSETRON HCL 4 MG/2ML IJ SOLN: 4.0000 mg | Freq: Once | INTRAMUSCULAR | Status: DC | PRN

## 2015-06-17 MED ORDER — FENTANYL CITRATE (PF) 100 MCG/2ML IJ SOLN
25.0000 ug | INTRAMUSCULAR | Status: DC | PRN
  Administered 2015-06-17 (×2): 50 ug via INTRAVENOUS
  Filled 2015-06-17: qty 2

## 2015-06-17 MED ORDER — MIDAZOLAM HCL 2 MG/2ML IJ SOLN
1.0000 mg | INTRAMUSCULAR | Status: DC | PRN
  Administered 2015-06-17: 2 mg via INTRAVENOUS

## 2015-06-17 MED ORDER — LACTATED RINGERS IV SOLN
INTRAVENOUS | Status: DC
  Administered 2015-06-17 (×2): via INTRAVENOUS

## 2015-06-17 MED ORDER — FENTANYL CITRATE (PF) 100 MCG/2ML IJ SOLN
INTRAMUSCULAR | Status: AC
Start: 2015-06-17 — End: 2015-06-17
  Filled 2015-06-17: qty 2

## 2015-06-17 MED ORDER — DEXAMETHASONE SODIUM PHOSPHATE 4 MG/ML IJ SOLN
INTRAMUSCULAR | Status: DC | PRN
  Administered 2015-06-17: 4 mg via INTRAVENOUS

## 2015-06-17 MED ORDER — PHENYLEPHRINE HCL 10 MG/ML IJ SOLN
INTRAMUSCULAR | Status: DC | PRN
  Administered 2015-06-17: 40 ug via INTRAVENOUS

## 2015-06-17 MED ORDER — POVIDONE-IODINE 10 % EX OINT
TOPICAL_OINTMENT | CUTANEOUS | Status: AC
  Filled 2015-06-17: qty 1

## 2015-06-17 MED ORDER — SODIUM CHLORIDE 0.9 % IJ SOLN
INTRAMUSCULAR | Status: AC
  Filled 2015-06-17: qty 3

## 2015-06-17 MED ORDER — CIPROFLOXACIN IN D5W 400 MG/200ML IV SOLN
INTRAVENOUS | Status: AC
  Filled 2015-06-17: qty 200

## 2015-06-17 MED ORDER — BUPIVACAINE HCL (PF) 0.5 % IJ SOLN
INTRAMUSCULAR | Status: AC
  Filled 2015-06-17: qty 30

## 2015-06-17 MED ORDER — BUPIVACAINE HCL (PF) 0.5 % IJ SOLN
INTRAMUSCULAR | Status: DC | PRN
  Administered 2015-06-17: 10 mL

## 2015-06-17 MED ORDER — BACITRACIN ZINC 500 UNIT/GM EX OINT
TOPICAL_OINTMENT | CUTANEOUS | Status: AC
  Filled 2015-06-17: qty 0.9

## 2015-06-17 MED ORDER — GLYCOPYRROLATE 0.2 MG/ML IJ SOLN
INTRAMUSCULAR | Status: AC
  Filled 2015-06-17: qty 1

## 2015-06-17 MED ORDER — ALBUTEROL SULFATE (2.5 MG/3ML) 0.083% IN NEBU
INHALATION_SOLUTION | RESPIRATORY_TRACT | Status: AC
  Filled 2015-06-17: qty 3

## 2015-06-17 MED ORDER — SODIUM CHLORIDE 0.9 % IN NEBU
INHALATION_SOLUTION | RESPIRATORY_TRACT | Status: AC
  Filled 2015-06-17: qty 3

## 2015-06-17 MED ORDER — MIDAZOLAM HCL 2 MG/2ML IJ SOLN
INTRAMUSCULAR | Status: AC
  Filled 2015-06-17: qty 2

## 2015-06-17 MED ORDER — ONDANSETRON HCL 4 MG/2ML IJ SOLN
INTRAMUSCULAR | Status: AC
  Filled 2015-06-17: qty 2

## 2015-06-17 MED ORDER — DEXAMETHASONE SODIUM PHOSPHATE 4 MG/ML IJ SOLN
4.0000 mg | Freq: Once | INTRAMUSCULAR | Status: AC
  Administered 2015-06-17: 4 mg via INTRAVENOUS

## 2015-06-17 MED ORDER — SUCCINYLCHOLINE CHLORIDE 20 MG/ML IJ SOLN
INTRAMUSCULAR | Status: DC | PRN
  Administered 2015-06-17 (×2): 100 mg via INTRAVENOUS

## 2015-06-17 MED ORDER — CHLORHEXIDINE GLUCONATE 4 % EX LIQD: 1.0000 "application " | Freq: Once | CUTANEOUS | Status: DC

## 2015-06-17 MED ORDER — HYDROCODONE-ACETAMINOPHEN 5-325 MG PO TABS: 1.0000 | ORAL_TABLET | ORAL | Status: DC | PRN

## 2015-06-17 MED ORDER — ONDANSETRON HCL 4 MG/2ML IJ SOLN
4.0000 mg | Freq: Once | INTRAMUSCULAR | Status: AC
  Administered 2015-06-17: 4 mg via INTRAVENOUS

## 2015-06-17 MED ORDER — GLYCOPYRROLATE 0.2 MG/ML IJ SOLN
0.2000 mg | Freq: Once | INTRAMUSCULAR | Status: AC
  Administered 2015-06-17: 0.2 mg via INTRAVENOUS

## 2015-06-17 MED ORDER — CIPROFLOXACIN IN D5W 400 MG/200ML IV SOLN
400.0000 mg | INTRAVENOUS | Status: AC
  Administered 2015-06-17: 400 mg via INTRAVENOUS

## 2015-06-17 MED ORDER — DEXAMETHASONE SODIUM PHOSPHATE 4 MG/ML IJ SOLN
INTRAMUSCULAR | Status: AC
  Filled 2015-06-17: qty 1

## 2015-06-17 MED ORDER — BACITRACIN 500 UNIT/GM EX OINT
TOPICAL_OINTMENT | CUTANEOUS | Status: DC | PRN
  Administered 2015-06-17: 1 via TOPICAL

## 2015-06-17 MED ORDER — PROPOFOL 10 MG/ML IV BOLUS
INTRAVENOUS | Status: DC | PRN
  Administered 2015-06-17: 200 mg via INTRAVENOUS
  Administered 2015-06-17: 100 mg via INTRAVENOUS

## 2015-06-17 MED ORDER — FENTANYL CITRATE (PF) 100 MCG/2ML IJ SOLN
INTRAMUSCULAR | Status: AC
  Filled 2015-06-17: qty 2

## 2015-06-17 SURGICAL SUPPLY — 48 items
APPLIER CLIP LAPSCP 10X32 DD (CLIP) ×3 IMPLANT
BAG HAMPER (MISCELLANEOUS) ×3 IMPLANT
BAG SPEC RTRVL LRG 6X4 10 (ENDOMECHANICALS) ×1
CHLORAPREP W/TINT 26ML (MISCELLANEOUS) ×3 IMPLANT
CLOTH BEACON ORANGE TIMEOUT ST (SAFETY) ×3 IMPLANT
COVER LIGHT HANDLE STERIS (MISCELLANEOUS) ×6 IMPLANT
DECANTER SPIKE VIAL GLASS SM (MISCELLANEOUS) ×3 IMPLANT
ELECT REM PT RETURN 9FT ADLT (ELECTROSURGICAL) ×3
ELECTRODE REM PT RTRN 9FT ADLT (ELECTROSURGICAL) ×1 IMPLANT
FILTER SMOKE EVAC LAPAROSHD (FILTER) ×3 IMPLANT
FORMALIN 10 PREFIL 120ML (MISCELLANEOUS) ×3 IMPLANT
GLOVE BIO SURGEON STRL SZ 6.5 (GLOVE) ×1 IMPLANT
GLOVE BIO SURGEONS STRL SZ 6.5 (GLOVE) ×1
GLOVE BIOGEL PI IND STRL 7.0 (GLOVE) ×1 IMPLANT
GLOVE BIOGEL PI IND STRL 7.5 (GLOVE) IMPLANT
GLOVE BIOGEL PI INDICATOR 7.0 (GLOVE) ×10
GLOVE BIOGEL PI INDICATOR 7.5 (GLOVE) ×2
GLOVE ECLIPSE 6.5 STRL STRAW (GLOVE) ×2 IMPLANT
GLOVE ECLIPSE 7.0 STRL STRAW (GLOVE) ×2 IMPLANT
GLOVE SURG SS PI 7.5 STRL IVOR (GLOVE) ×3 IMPLANT
GOWN STRL REUS W/ TWL XL LVL3 (GOWN DISPOSABLE) ×1 IMPLANT
GOWN STRL REUS W/TWL LRG LVL3 (GOWN DISPOSABLE) ×6 IMPLANT
GOWN STRL REUS W/TWL XL LVL3 (GOWN DISPOSABLE) ×3
HEMOSTAT SNOW SURGICEL 2X4 (HEMOSTASIS) ×3 IMPLANT
INST SET LAPROSCOPIC AP (KITS) ×3 IMPLANT
IV NS IRRIG 3000ML ARTHROMATIC (IV SOLUTION) IMPLANT
KIT ROOM TURNOVER APOR (KITS) ×3 IMPLANT
MANIFOLD NEPTUNE II (INSTRUMENTS) ×3 IMPLANT
NDL INSUFFLATION 14GA 120MM (NEEDLE) ×1 IMPLANT
NEEDLE INSUFFLATION 14GA 120MM (NEEDLE) ×3 IMPLANT
NS IRRIG 1000ML POUR BTL (IV SOLUTION) ×3 IMPLANT
PACK LAP CHOLE LZT030E (CUSTOM PROCEDURE TRAY) ×3 IMPLANT
PAD ARMBOARD 7.5X6 YLW CONV (MISCELLANEOUS) ×3 IMPLANT
POUCH SPECIMEN RETRIEVAL 10MM (ENDOMECHANICALS) ×3 IMPLANT
SET BASIN LINEN APH (SET/KITS/TRAYS/PACK) ×3 IMPLANT
SET TUBE IRRIG SUCTION NO TIP (IRRIGATION / IRRIGATOR) IMPLANT
SLEEVE ENDOPATH XCEL 5M (ENDOMECHANICALS) ×3 IMPLANT
SPONGE GAUZE 2X2 8PLY STER LF (GAUZE/BANDAGES/DRESSINGS) ×4
SPONGE GAUZE 2X2 8PLY STRL LF (GAUZE/BANDAGES/DRESSINGS) ×8 IMPLANT
STAPLER VISISTAT (STAPLE) ×3 IMPLANT
SUT VICRYL 0 UR6 27IN ABS (SUTURE) ×3 IMPLANT
TAPE CLOTH SURG 4X10 WHT LF (GAUZE/BANDAGES/DRESSINGS) ×2 IMPLANT
TROCAR ENDO BLADELESS 11MM (ENDOMECHANICALS) ×3 IMPLANT
TROCAR XCEL NON-BLD 5MMX100MML (ENDOMECHANICALS) ×3 IMPLANT
TROCAR XCEL UNIV SLVE 11M 100M (ENDOMECHANICALS) ×3 IMPLANT
TUBING INSUFFLATION (TUBING) ×3 IMPLANT
WARMER LAPAROSCOPE (MISCELLANEOUS) ×3 IMPLANT
YANKAUER SUCT 12FT TUBE ARGYLE (SUCTIONS) ×3 IMPLANT

## 2015-06-17 NOTE — Anesthesia Postprocedure Evaluation (Signed)
  Anesthesia Post-op Note  Patient: Kimberly [name]  Procedure(s) Performed: Procedure(s): LAPAROSCOPIC CHOLECYSTECTOMY (N/A)  Patient Location: PACU  Anesthesia Type:General  Level of Consciousness: awake and patient cooperative  Airway and Oxygen Therapy: Patient Spontanous Breathing  Post-op Pain: mild  Post-op Assessment: Post-op Vital signs reviewed, Patient's Cardiovascular Status Stable, Respiratory Function Stable and Patent Airway              Post-op Vital Signs: Reviewed and stable  Last Vitals:  Filed Vitals:   06/17/15 1306  BP:   Pulse: 96  Temp:   Resp: 25    Complications: No apparent anesthesia complications

## 2015-06-17 NOTE — Anesthesia Procedure Notes (Signed)
Procedure Name: Intubation Date/Time: 06/17/2015 11:57 AM Performed by: Lieutenant Diego Pre-anesthesia Checklist: Patient identified, Emergency Drugs available, Suction available and Patient being monitored Patient Re-evaluated:Patient Re-evaluated prior to inductionOxygen Delivery Method: Circle System Utilized Preoxygenation: Pre-oxygenation with 100% oxygen Intubation Type: IV induction Ventilation: Mask ventilation without difficulty Laryngoscope size: glidescope for known difficult. Grade View: Grade II Tube type: Oral Number of attempts: 3 Airway Equipment and Method: Stylet and Oral airway Placement Confirmation: ETT inserted through vocal cords under direct vision,  positive ETCO2 and breath sounds checked- equal and bilateral Secured at: 22 cm Tube secured with: Tape Dental Injury: Teeth and Oropharynx as per pre-operative assessment  Comments: Started with glidescope, first look unable to pass. Needed extreme J shape to ETT. Very anterior larynx. Next pass with glide and ett, fogged lens. Third pass successful, intubated trachea, etco2, fog in tube. Sats 98-100% for duration of intubation attempts. Easy mask vent.

## 2015-06-17 NOTE — Anesthesia Preprocedure Evaluation (Signed)
Anesthesia Evaluation  Patient identified by MRN, date of birth, ID band Patient awake    Reviewed: Allergy & Precautions, NPO status   History of Anesthesia Complications (+) PONV, DIFFICULT AIRWAY and history of anesthetic complications  Airway Mallampati: III  TM Distance: >3 FB Neck ROM: Limited  Mouth opening: Limited Mouth Opening  Dental no notable dental hx. (+) Dental Advisory Given, Teeth Intact   Pulmonary shortness of breath and with exertion, COPD,    Pulmonary exam normal breath sounds clear to auscultation       Cardiovascular hypertension, Pt. on medications Normal cardiovascular exam Rhythm:Regular Rate:Normal     Neuro/Psych PSYCHIATRIC DISORDERS Anxiety Depression  Neuromuscular disease negative neurological ROS     GI/Hepatic Neg liver ROS, GERD  Medicated and Controlled,  Endo/Other  negative endocrine ROS  Renal/GU negative Renal ROS  negative genitourinary   Musculoskeletal  (+) Arthritis , Osteoarthritis,    Abdominal   Peds negative pediatric ROS (+)  Hematology negative hematology ROS (+)   Anesthesia Other Findings   Reproductive/Obstetrics negative OB ROS                             Anesthesia Physical Anesthesia Plan  ASA: III  Anesthesia Plan: General   Post-op Pain Management:    Induction: Intravenous, Rapid sequence and Cricoid pressure planned  Airway Management Planned: Oral ETT  Additional Equipment:   Intra-op Plan:   Post-operative Plan: Extubation in OR  Informed Consent: I have reviewed the patients History and Physical, chart, labs and discussed the procedure including the risks, benefits and alternatives for the proposed anesthesia with the patient or authorized representative who has indicated his/her understanding and acceptance.     Plan Discussed with:   Anesthesia Plan Comments:         Anesthesia Quick  Evaluation

## 2015-06-17 NOTE — Transfer of Care (Signed)
Immediate Anesthesia Transfer of Care Note  Patient: Kimberly [name]  Procedure(s) Performed: Procedure(s): LAPAROSCOPIC CHOLECYSTECTOMY (N/A)  Patient Location: PACU  Anesthesia Type:General  Level of Consciousness: awake  Airway & Oxygen Therapy: Patient Spontanous Breathing and Patient connected to face mask oxygen  Post-op Assessment: Report given to RN and Post -op Vital signs reviewed and stable  Post vital signs: Reviewed and stable  Last Vitals:  Filed Vitals:   06/17/15 1100  BP: 126/85  Pulse:   Temp:   Resp: 22    Complications: No apparent anesthesia complications

## 2015-06-17 NOTE — Op Note (Signed)
Patient:  Kimberly [name]  DOB:  [birth year]  MRN:  [id number]   Preop Diagnosis:  Cholecystitis, cholelithiasis  Postop Diagnosis:  Same  Procedure:  Laparoscopic cholecystectomy  Surgeon:  Aviva Signs, M.D.  Anes:  Gen. endotracheal  Indications:  Patient is a 54 year old black female who presents with cholecystitis secondary to cholelithiasis. The risks and benefits of the procedure including bleeding, infection, hepatobiliary injury, and the possibility of an open procedure were fully explained to the patient, who gave informed consent.  Procedure note:  The patient was placed the supine position. After induction of general endotracheal anesthesia, the abdomen was prepped and draped using the usual sterile technique with ChloraPrep. Surgical site confirmation was performed.  A supraumbilical incision was made down to the fascia. A Veress needle was introduced into the abdominal cavity and confirmation of placement was done using the saline drop test. The abdomen was then insufflated to 16 mmHg pressure. 11 mm trocar was introduced into the abdominal cavity under direct visualization without difficulty. The patient was placed in reverse Trendelenburg position and additional 11 mm trocar was placed the epigastric region a 5 mm trochars were placed the right upper quadrant and right flank regions. The liver was inspected and noted to be within normal limits. The gallbladder was retracted in a dynamic fashion in order to provide a critical view of the triangle of Calot. The cystic duct was first identified. Its junction to the infundibulum was fully identified. Endoclips were placed proximally and distally on the cystic duct, the cystic duct was divided. This was likewise done cystic artery. The gallbladder was freed away from the gallbladder fossa using Bovie electrocautery. The gallbladder delivered through the epigastric trocar site using an Endo Catch bag. The gallbladder fossa was inspected  no abnormal bleeding or bile leakage was noted. Surgicel was placed the gallbladder fossa. All fluid and air were then evacuated from the abdominal cavity prior to removal of the trochars.  All wounds were irrigated with normal saline. All wounds were injected with 0.5 sent Sensorcaine. The supraumbilical fascia was reapproximated using 0 Vicryl interrupted suture. All skin incisions were closed using staples. Betadine ointment and dry sterile dressings were applied.  All tape and needle counts were correct at the end of the procedure. Patient was extubated in the operating room and transferred to PACU in stable condition.    Complications:  None  EBL:  Minimal  Specimen:  Gallbladder

## 2015-06-17 NOTE — Discharge Instructions (Signed)
Laparoscopic Cholecystectomy, Care After °Refer to this sheet in the next few weeks. These instructions provide you with information about caring for yourself after your procedure. Your health care provider may also give you more specific instructions. Your treatment has been planned according to current medical practices, but problems sometimes occur. Call your health care provider if you have any problems or questions after your procedure. °WHAT TO EXPECT AFTER THE PROCEDURE °After your procedure, it is common to have: °· Pain at your incision sites. You will be given pain medicines to control your pain. °· Mild nausea or vomiting. This should improve after the first 24 hours. °· Bloating and possible shoulder pain from the gas that was used during the procedure. This will improve after the first 24 hours. °HOME CARE INSTRUCTIONS °Incision Care °· Follow instructions from your health care provider about how to take care of your incisions. Make sure you: °¨ Wash your hands with soap and water before you change your bandage (dressing). If soap and water are not available, use hand sanitizer. °¨ Change your dressing as told by your health care provider. °¨ Leave stitches (sutures), skin glue, or adhesive strips in place. These skin closures may need to be in place for 2 weeks or longer. If adhesive strip edges start to loosen and curl up, you may trim the loose edges. Do not remove adhesive strips completely unless your health care provider tells you to do that. °· Do not take baths, swim, or use a hot tub until your health care provider approves. Ask your health care provider if you can take showers. You may only be allowed to take sponge baths for bathing. °General Instructions °· Take over-the-counter and prescription medicines only as told by your health care provider. °· Do not drive or operate heavy machinery while taking prescription pain medicine. °· Return to your normal diet as told by your health care  provider. °· Do not lift anything that is heavier than 10 lb (4.5 kg). °· Do not play contact sports for one week or until your health care provider approves. °SEEK MEDICAL CARE IF:  °· You have redness, swelling, or pain at the site of your incision. °· You have fluid, blood, or pus coming from your incision. °· You notice a bad smell coming from your incision area. °· Your surgical incisions break open. °· You have a fever. °SEEK IMMEDIATE MEDICAL CARE IF: °· You develop a rash. °· You have difficulty breathing. °· You have chest pain. °· You have increasing pain in your shoulders (shoulder strap areas). °· You faint or have dizzy episodes while you are standing. °· You have severe pain in your abdomen. °· You have nausea or vomiting that lasts for more than one day. °  °This information is not intended to replace advice given to you by your health care provider. Make sure you discuss any questions you have with your health care provider. °  °Document Released: 07/16/2005 Document Revised: 04/06/2015 Document Reviewed: 02/25/2013 °Elsevier Interactive Patient Education ©2016 [employer] ° °

## 2015-06-17 NOTE — Interval H&P Note (Signed)
History and Physical Interval Note:  06/17/2015 11:04 AM  Kimberly [name]  has presented today for surgery, with the diagnosis of cholelithiasis  The various methods of treatment have been discussed with the patient and family. After consideration of risks, benefits and other options for treatment, the patient has consented to  Procedure(s): LAPAROSCOPIC CHOLECYSTECTOMY (N/A) as a surgical intervention .  The patient's history has been reviewed, patient examined, no change in status, stable for surgery.  I have reviewed the patient's chart and labs.  Questions were answered to the patient's satisfaction.     Aviva Signs A

## 2015-06-20 ENCOUNTER — Encounter (HOSPITAL_COMMUNITY): Payer: Self-pay | Admitting: General Surgery

## 2015-06-21 NOTE — Addendum Note (Signed)
Addendum  created 06/21/15 1344 by Vista Deck, CRNA   Modules edited: Anesthesia Events, Narrator   Narrator:  Narrator: Event Log Edited

## 2015-06-22 ENCOUNTER — Encounter (HOSPITAL_COMMUNITY): Payer: 59 | Admitting: Specialist

## 2015-06-29 ENCOUNTER — Ambulatory Visit (HOSPITAL_COMMUNITY): Payer: 59 | Admitting: Specialist

## 2015-06-29 DIAGNOSIS — M629 Disorder of muscle, unspecified: Secondary | ICD-10-CM

## 2015-06-29 DIAGNOSIS — R29898 Other symptoms and signs involving the musculoskeletal system: Secondary | ICD-10-CM

## 2015-06-29 DIAGNOSIS — M6289 Other specified disorders of muscle: Secondary | ICD-10-CM

## 2015-06-29 DIAGNOSIS — M25512 Pain in left shoulder: Secondary | ICD-10-CM

## 2015-06-29 DIAGNOSIS — M25612 Stiffness of left shoulder, not elsewhere classified: Secondary | ICD-10-CM

## 2015-06-29 NOTE — Therapy (Signed)
[place], [place], [address] Phone: [phone]   Fax:  [phone]  Occupational Therapy Treatment  Patient Details  Name: Kimberly [name] MRN: [id number] Date of Birth: Nov 23, 1960 Referring Provider: Dr. French Ana  Encounter Date: 06/29/2015      OT End of Session - 06/29/15 1042    Visit Number 6   Number of Visits 11   Date for OT Re-Evaluation 07/24/15   Authorization Type UHC    Authorization Time Period will need to meet goals in order for insurance to continue.  Has 11 visits remaining   Authorization - Visit Number 6   Authorization - Number of Visits 11   OT Start Time [phone]   OT Stop Time 0930   OT Time Calculation (min) 39 min   Activity Tolerance Patient tolerated treatment well   Behavior During Therapy WFL for tasks assessed/performed      Past Medical History  Diagnosis Date  . COPD (chronic obstructive pulmonary disease) ([place])   . Herpes   . Hypertension     pt. doesn't see cardiologist, followed for HTN by Dr. Gerarda Fraction  . Shortness of breath dyspnea   . Anxiety     takes alprazolam - rare use   . GERD (gastroesophageal reflux disease)     pt. reports that its better, no meds in use at this time- 2015  . Arthritis     cervical spondylosis   . History of pneumonia   . Chronic bronchitis with COPD (chronic obstructive pulmonary disease) ([place])   . Brain cyst   . Recurrent falls   . Balance problem   . Urinary frequency   . History of kidney stones   . Inflammation of shoulder joint   . Depression   . PONV (postoperative nausea and vomiting)   . Difficult intubation     Small mouth opening, limited neck flexion, very anterior     Past Surgical History  Procedure Laterality Date  . Abdominal hysterectomy    . Rotator cuff repair Left   . Foreign body removal Left     knee-as child  . Carpal tunnel release Left 02/16/2013    Procedure: CARPAL TUNNEL RELEASE;  Surgeon: Carole Civil, MD;  Location: [facility];  Service: Orthopedics;  Laterality: Left;  . Carpal tunnel release Right 03/27/2013    Procedure: RIGHT CARPAL TUNNEL RELEASE;  Surgeon: Carole Civil, MD;  Location: [facility];  Service: Orthopedics;  Laterality: Right;  . Cesarean section      x2  . Anterior cervical decomp/discectomy fusion N/A 08/06/2014    Procedure: ANTERIOR CERVICAL DECOMPRESSION/DISCECTOMY FUSION CERVICAL THREE-FOUR,CERVICAL SIX-SEVEN ,CERVICAL SEVEN-THORACIC ONE;  Surgeon: Floyce Stakes, MD;  Location: [place];  Service: Neurosurgery;  Laterality: N/A;  . Colonoscopy    . Nasal sinus surgery N/A 04/13/2015    Procedure: nasal endoscopy with adenoid biopsy;  Surgeon: Ruby Cola, MD;  Location: [place];  Service: ENT;  Laterality: N/A;  . Shoulder arthroscopy with distal clavicle resection Left 05/18/2015    Procedure: LEFT SHOULDER ARTHROSCOPY WITH  DISTAL CLAVICLE RESECTION;  Surgeon: Earlie Server, MD;  Location: [place];  Service: Orthopedics;  Laterality: Left;  . Shoulder acromioplasty Left 05/18/2015    Procedure: SHOULDER ACROMIOPLASTY;  Surgeon: Earlie Server, MD;  Location: [place];  Service: Orthopedics;  Laterality: Left;  . Cholecystectomy N/A 06/17/2015    Procedure: LAPAROSCOPIC CHOLECYSTECTOMY;  Surgeon: Aviva Signs, MD;  [facility];  Service: General;  Laterality: N/A;    There were no vitals filed for this visit.  Visit Diagnosis:  Tight fascia  Left shoulder pain  Shoulder weakness  Stiffness of shoulder joint, left      Subjective Assessment - 06/29/15 0853    Subjective  S:  I had to have my gall bladder removed.  My arm is better it just hurts on the top of it and when I lay on my left arm it makes me dizzy.   Currently in Pain? Yes   Pain Score 3    Pain Location Shoulder   Pain Orientation Left   Pain Descriptors / Indicators Aching   Pain Type Acute pain            [facility] OT Assessment - 06/29/15  0001    Assessment   Diagnosis S/P Left SAD/DCE/RCR   Onset Date 05/18/15   Precautions   Precautions Shoulder   Precaution Comments PROM X 6 weeks (06/29/15) then progress as tolerated   Observation/Other Assessments   Standing Functional Reach Test 49/100   Palpation   Palpation comment min fascial restrictions in left shoulder region    AROM   Overall AROM Comments assessed in supine and seated (in parenthesis) ER/IR assessed with shoulder abducted    Left Shoulder Flexion 160 Degrees  (140)   Left Shoulder ABduction 170 Degrees  (95)   Left Shoulder Internal Rotation 80 Degrees  (50)   Left Shoulder External Rotation 50 Degrees  (70)   PROM   Overall PROM Comments PROM is WFL in supine   Strength   Left Shoulder Flexion 3+/5   Left Shoulder ABduction 3+/5   Left Shoulder Internal Rotation 4-/5   Left Shoulder External Rotation 3+/5                  OT Treatments/Exercises (OP) - 06/29/15 0001    Exercises   Exercises Shoulder   Shoulder Exercises: Supine   Protraction PROM;10 reps   Horizontal ABduction PROM;10 reps   External Rotation PROM;10 reps   Internal Rotation PROM;10 reps   Flexion PROM;10 reps   ABduction PROM;10 reps   Shoulder Exercises: Seated   Protraction AAROM;10 reps   Horizontal ABduction AAROM;10 reps   External Rotation AAROM;10 reps   Internal Rotation AAROM;10 reps   Flexion AAROM;10 reps   Abduction AAROM;10 reps   Shoulder Exercises: Standing   Extension Theraband;10 reps   Theraband Level (Shoulder Extension) Level 2 (Red)   Row Theraband;10 reps   Theraband Level (Shoulder Row) Level 2 (Red)   Retraction Theraband;10 reps   Theraband Level (Shoulder Retraction) Level 2 (Red)   Shoulder Exercises: Therapy Ball   Right/Left 5 reps   Shoulder Exercises: ROM/Strengthening   Wall Wash 1'   Thumb Tacks 1'   Proximal Shoulder Strengthening, Seated 10 times each without resting   Manual Therapy   Manual Therapy Myofascial  release   Manual therapy comments Manual therapy completed prior to all other interventions this date   Myofascial Release Myofascial release and manual stretching to left upper arm, scapular, and shoulder region to decrease pain and fascial restrictions and improve pain free mobility                  OT Short Term Goals - 06/29/15 0910    OT SHORT TERM GOAL #1   Title Patient will be educated and independent with an HEP for shoulder mobility and strength.   Time 3  Period Weeks   Status Achieved   OT SHORT TERM GOAL #2   Title Patient will improve left shoulder P/ROM to [facility] for increased ability to don and dof clothing independently.   Time 3   Period Weeks   Status Achieved   OT SHORT TERM GOAL #3   Title Patient will decrease pain in her left shoulder to 5/10 or better when completing daily tasks.   Time 3   Period Weeks   Status Achieved   OT SHORT TERM GOAL #4   Title Patient will decrease fascial restrictions to minimal in her left shoulder region for greater mobility needed for ADL completion.    Time 3   Period Weeks   Status Achieved           OT Long Term Goals - 06/29/15 0910    OT LONG TERM GOAL #1   Title Patient will return to prior level of independence with all B/IADLs and leisure activities using left arm as an active assist.   Time 6   Period Weeks   Status On-going   OT LONG TERM GOAL #2   Title Patient will improve left shoulder A/ROM to [facility] for increased ability to reach into overhead cabinets.   Time 6   Period Weeks   Status On-going   OT LONG TERM GOAL #3   Title Patient will improve left shoulder strength to 5/5 for increased ability to lift grocery bags.   Time 6   Period Weeks   Status On-going   OT LONG TERM GOAL #4   Title Patient will decrease pain in left shoulder to 2/10 or better when getting comfortable to sleep.   Time 6   Period Weeks   Status On-going   OT LONG TERM GOAL #5   Title Patient will decrease fascial  restrictions in left shoulder to trace for increased comfort when completing daily activities.    Time 6   Period Weeks   Status On-going               Plan - 06/29/15 1043    Clinical Impression Statement A:  Reassessment completed this date.  Patient has met all short term goals.  Her PROM is [facility] and AROM is progressing towards [facility].  Strength is fair +.  Patient will benefit from continued skilled OT intervention to achieve end range A/ROM and strength needed to resume full use of LUE with daily activites.     Plan P:  Focus on A/ROM in supine and seated, proximal shoulder stability, update HEP for A/ROM at home.         Problem List Patient Active Problem List   Diagnosis Date Noted  . Cervical stenosis of spinal canal 08/06/2014  . Muscle weakness (generalized) 11/12/2013  . Tight fascia 11/12/2013  . Decreased range of motion of shoulder 11/12/2013  . Cervical spondylosis without myelopathy 10/29/2013  . S/P carpal tunnel release 02/19/2013  . CTS (carpal tunnel syndrome) 02/19/2013  . SHOULDER PAIN 10/06/2007  . IMPINGEMENT SYNDROME 10/06/2007  . RUPTURE ROTATOR CUFF 10/06/2007  . HIGH BLOOD PRESSURE 10/03/2007    Vangie Bicker, OTR/L [phone]  06/29/2015, 10:46 AM  [place] [address] [place], [place], [address] Phone: [phone]   Fax:  [phone]  Name: Kimberly [name] MRN: [id number] Date of Birth: [birth year]

## 2015-07-01 ENCOUNTER — Encounter (HOSPITAL_COMMUNITY): Payer: Self-pay

## 2015-07-01 ENCOUNTER — Ambulatory Visit (HOSPITAL_COMMUNITY): Payer: 59 | Attending: Neurosurgery

## 2015-07-01 DIAGNOSIS — M25612 Stiffness of left shoulder, not elsewhere classified: Secondary | ICD-10-CM | POA: Diagnosis present

## 2015-07-01 DIAGNOSIS — M629 Disorder of muscle, unspecified: Secondary | ICD-10-CM | POA: Diagnosis present

## 2015-07-01 DIAGNOSIS — M6289 Other specified disorders of muscle: Secondary | ICD-10-CM

## 2015-07-01 DIAGNOSIS — M25512 Pain in left shoulder: Secondary | ICD-10-CM

## 2015-07-01 DIAGNOSIS — R29898 Other symptoms and signs involving the musculoskeletal system: Secondary | ICD-10-CM | POA: Diagnosis present

## 2015-07-01 NOTE — Therapy (Signed)
[place], [place], [address] Phone: [phone]   Fax:  [phone]  Occupational Therapy Treatment  Patient Details  Name: Kimberly [name] MRN: [id number] Date of Birth: 12/29/1960 Referring Provider: Dr. French Ana  Encounter Date: 07/01/2015      OT End of Session - 07/01/15 1137    Visit Number 7   Number of Visits 11   Date for OT Re-Evaluation 07/24/15   Authorization Type UHC    Authorization Time Period will need to meet goals in order for insurance to continue.  Has 11 visits remaining   Authorization - Visit Number 7   Authorization - Number of Visits 11   OT Start Time 1100   OT Stop Time 1138   OT Time Calculation (min) 38 min   Activity Tolerance Patient tolerated treatment well   Behavior During Therapy WFL for tasks assessed/performed      Past Medical History  Diagnosis Date  . COPD (chronic obstructive pulmonary disease) ([place])   . Herpes   . Hypertension     pt. doesn't see cardiologist, followed for HTN by Dr. Gerarda Fraction  . Shortness of breath dyspnea   . Anxiety     takes alprazolam - rare use   . GERD (gastroesophageal reflux disease)     pt. reports that its better, no meds in use at this time- 2015  . Arthritis     cervical spondylosis   . History of pneumonia   . Chronic bronchitis with COPD (chronic obstructive pulmonary disease) ([place])   . Brain cyst   . Recurrent falls   . Balance problem   . Urinary frequency   . History of kidney stones   . Inflammation of shoulder joint   . Depression   . PONV (postoperative nausea and vomiting)   . Difficult intubation     Small mouth opening, limited neck flexion, very anterior     Past Surgical History  Procedure Laterality Date  . Abdominal hysterectomy    . Rotator cuff repair Left   . Foreign body removal Left     knee-as child  . Carpal tunnel release Left 02/16/2013    Procedure: CARPAL TUNNEL RELEASE;  Surgeon: Carole Civil, MD;  Location: [facility];  Service: Orthopedics;  Laterality: Left;  . Carpal tunnel release Right 03/27/2013    Procedure: RIGHT CARPAL TUNNEL RELEASE;  Surgeon: Carole Civil, MD;  Location: [facility];  Service: Orthopedics;  Laterality: Right;  . Cesarean section      x2  . Anterior cervical decomp/discectomy fusion N/A 08/06/2014    Procedure: ANTERIOR CERVICAL DECOMPRESSION/DISCECTOMY FUSION CERVICAL THREE-FOUR,CERVICAL SIX-SEVEN ,CERVICAL SEVEN-THORACIC ONE;  Surgeon: Floyce Stakes, MD;  Location: [place];  Service: Neurosurgery;  Laterality: N/A;  . Colonoscopy    . Nasal sinus surgery N/A 04/13/2015    Procedure: nasal endoscopy with adenoid biopsy;  Surgeon: Ruby Cola, MD;  Location: [place];  Service: ENT;  Laterality: N/A;  . Shoulder arthroscopy with distal clavicle resection Left 05/18/2015    Procedure: LEFT SHOULDER ARTHROSCOPY WITH  DISTAL CLAVICLE RESECTION;  Surgeon: Earlie Server, MD;  Location: [place];  Service: Orthopedics;  Laterality: Left;  . Shoulder acromioplasty Left 05/18/2015    Procedure: SHOULDER ACROMIOPLASTY;  Surgeon: Earlie Server, MD;  Location: [place];  Service: Orthopedics;  Laterality: Left;  . Cholecystectomy N/A 06/17/2015    Procedure: LAPAROSCOPIC CHOLECYSTECTOMY;  Surgeon: Aviva Signs, MD;  [facility];  Service: General;  Laterality: N/A;    There were no vitals filed for this visit.  Visit Diagnosis:  Left shoulder pain  Tight fascia  Shoulder weakness  Stiffness of shoulder joint, left          [facility] OT Assessment - 07/01/15 1117    Assessment   Diagnosis S/P Left SAD/DCE/RCR   Precautions   Precautions Shoulder   Precaution Comments PROM X 6 weeks (06/29/15) then progress as tolerated                  OT Treatments/Exercises (OP) - 07/01/15 1117    Exercises   Exercises Shoulder   Shoulder Exercises: Supine   Protraction PROM;5 reps;AROM;10 reps   Horizontal  ABduction PROM;5 reps;AROM;10 reps   External Rotation PROM;5 reps;AROM;10 reps   Internal Rotation PROM;5 reps;AROM;10 reps   Flexion PROM;5 reps;AROM;10 reps   ABduction PROM;5 reps;AROM;10 reps   Shoulder Exercises: Standing   Protraction AROM;10 reps   Horizontal ABduction AROM;10 reps   External Rotation AROM;10 reps   Internal Rotation AROM;10 reps   Flexion AROM;10 reps   ABduction AROM;10 reps   Shoulder Exercises: ROM/Strengthening   UBE (Upper Arm Bike) Level 1 2' forward 2' reverse   Wall Wash 1'   Over Head Lace 2'   X to V Arms 10X   Proximal Shoulder Strengthening, Supine 10X no rest breaks   Proximal Shoulder Strengthening, Seated 10 times each without resting   Manual Therapy   Manual Therapy Myofascial release   Manual therapy comments Manual therapy completed prior to all other interventions this date   Myofascial Release Myofascial release and manual stretching to left upper arm, scapular, and shoulder region to decrease pain and fascial restrictions and improve pain free mobility                  OT Short Term Goals - 06/29/15 0910    OT SHORT TERM GOAL #1   Title Patient will be educated and independent with an HEP for shoulder mobility and strength.   Time 3   Period Weeks   Status Achieved   OT SHORT TERM GOAL #2   Title Patient will improve left shoulder P/ROM to [facility] for increased ability to don and dof clothing independently.   Time 3   Period Weeks   Status Achieved   OT SHORT TERM GOAL #3   Title Patient will decrease pain in her left shoulder to 5/10 or better when completing daily tasks.   Time 3   Period Weeks   Status Achieved   OT SHORT TERM GOAL #4   Title Patient will decrease fascial restrictions to minimal in her left shoulder region for greater mobility needed for ADL completion.    Time 3   Period Weeks   Status Achieved           OT Long Term Goals - 06/29/15 0910    OT LONG TERM GOAL #1   Title Patient will  return to prior level of independence with all B/IADLs and leisure activities using left arm as an active assist.   Time 6   Period Weeks   Status On-going   OT LONG TERM GOAL #2   Title Patient will improve left shoulder A/ROM to [facility] for increased ability to reach into overhead cabinets.   Time 6   Period Weeks   Status On-going   OT LONG TERM GOAL #3   Title Patient will improve left shoulder  strength to 5/5 for increased ability to lift grocery bags.   Time 6   Period Weeks   Status On-going   OT LONG TERM GOAL #4   Title Patient will decrease pain in left shoulder to 2/10 or better when getting comfortable to sleep.   Time 6   Period Weeks   Status On-going   OT LONG TERM GOAL #5   Title Patient will decrease fascial restrictions in left shoulder to trace for increased comfort when completing daily activities.    Time 6   Period Weeks   Status On-going               Plan - 07/01/15 1140    Clinical Impression Statement A: Progressed to A/ROM supine and standing. Added overhead lacing, X to V arms, and UBE bike. Patient required min VC for form and technique. Some soreness noted at end of session.   Plan P: Continue to work on increase shoulder stability and strength with A/ROM exercises.         Problem List Patient Active Problem List   Diagnosis Date Noted  . Cervical stenosis of spinal canal 08/06/2014  . Muscle weakness (generalized) 11/12/2013  . Tight fascia 11/12/2013  . Decreased range of motion of shoulder 11/12/2013  . Cervical spondylosis without myelopathy 10/29/2013  . S/P carpal tunnel release 02/19/2013  . CTS (carpal tunnel syndrome) 02/19/2013  . SHOULDER PAIN 10/06/2007  . IMPINGEMENT SYNDROME 10/06/2007  . RUPTURE ROTATOR CUFF 10/06/2007  . HIGH BLOOD PRESSURE 10/03/2007    Ailene Ravel, OTR/L,CBIS  [phone]  07/01/2015, 11:42 AM  [place], [place],  [address] Phone: [phone]   Fax:  [phone]  Name: Kimberly [name] MRN: [id number] Date of Birth: Mar 12, 1961

## 2015-07-06 ENCOUNTER — Ambulatory Visit (HOSPITAL_COMMUNITY): Payer: 59

## 2015-07-06 ENCOUNTER — Encounter (HOSPITAL_COMMUNITY): Payer: Self-pay

## 2015-07-06 ENCOUNTER — Other Ambulatory Visit (HOSPITAL_COMMUNITY): Payer: Self-pay | Admitting: Internal Medicine

## 2015-07-06 DIAGNOSIS — M25512 Pain in left shoulder: Secondary | ICD-10-CM

## 2015-07-06 DIAGNOSIS — M25612 Stiffness of left shoulder, not elsewhere classified: Secondary | ICD-10-CM

## 2015-07-06 DIAGNOSIS — M6289 Other specified disorders of muscle: Secondary | ICD-10-CM

## 2015-07-06 DIAGNOSIS — R29898 Other symptoms and signs involving the musculoskeletal system: Secondary | ICD-10-CM

## 2015-07-06 DIAGNOSIS — M629 Disorder of muscle, unspecified: Secondary | ICD-10-CM

## 2015-07-06 DIAGNOSIS — Z1231 Encounter for screening mammogram for malignant neoplasm of breast: Secondary | ICD-10-CM

## 2015-07-06 NOTE — Therapy (Signed)
[place], [place], [address] Phone: [phone]   Fax:  [phone]  Occupational Therapy Treatment  Patient Details  Name: Kimberly [name] MRN: [id number] Date of Birth: 04-May-1961 Referring Provider: Dr. French Ana  Encounter Date: 07/06/2015      OT End of Session - 07/06/15 1012    Visit Number 8   Number of Visits 11   Date for OT Re-Evaluation 07/24/15   Authorization Type UHC    Authorization Time Period will need to meet goals in order for insurance to continue.  Has 11 visits remaining   Authorization - Visit Number 8   Authorization - Number of Visits 11   OT Start Time [phone]  Pt arrived early although therapist confused appt times and thought patient's appt was at 8:45. Tx session was short due to confusion.   OT Stop Time [phone]   OT Time Calculation (min) 33 min   Activity Tolerance Patient tolerated treatment well   Behavior During Therapy [facility] for tasks assessed/performed      Past Medical History  Diagnosis Date  . COPD (chronic obstructive pulmonary disease) ([place])   . Herpes   . Hypertension     pt. doesn't see cardiologist, followed for HTN by Dr. Gerarda Fraction  . Shortness of breath dyspnea   . Anxiety     takes alprazolam - rare use   . GERD (gastroesophageal reflux disease)     pt. reports that its better, no meds in use at this time- 2015  . Arthritis     cervical spondylosis   . History of pneumonia   . Chronic bronchitis with COPD (chronic obstructive pulmonary disease) ([place])   . Brain cyst   . Recurrent falls   . Balance problem   . Urinary frequency   . History of kidney stones   . Inflammation of shoulder joint   . Depression   . PONV (postoperative nausea and vomiting)   . Difficult intubation     Small mouth opening, limited neck flexion, very anterior     Past Surgical History  Procedure Laterality Date  . Abdominal hysterectomy    . Rotator cuff repair Left   . Foreign  body removal Left     knee-as child  . Carpal tunnel release Left 02/16/2013    Procedure: CARPAL TUNNEL RELEASE;  Surgeon: Carole Civil, MD;  [facility];  Service: Orthopedics;  Laterality: Left;  . Carpal tunnel release Right 03/27/2013    Procedure: RIGHT CARPAL TUNNEL RELEASE;  Surgeon: Carole Civil, MD;  [facility];  Service: Orthopedics;  Laterality: Right;  . Cesarean section      x2  . Anterior cervical decomp/discectomy fusion N/A 08/06/2014    Procedure: ANTERIOR CERVICAL DECOMPRESSION/DISCECTOMY FUSION CERVICAL THREE-FOUR,CERVICAL SIX-SEVEN ,CERVICAL SEVEN-THORACIC ONE;  Surgeon: Floyce Stakes, MD;  Location: [place];  Service: Neurosurgery;  Laterality: N/A;  . Colonoscopy    . Nasal sinus surgery N/A 04/13/2015    Procedure: nasal endoscopy with adenoid biopsy;  Surgeon: Ruby Cola, MD;  Location: [place];  Service: ENT;  Laterality: N/A;  . Shoulder arthroscopy with distal clavicle resection Left 05/18/2015    Procedure: LEFT SHOULDER ARTHROSCOPY WITH  DISTAL CLAVICLE RESECTION;  Surgeon: Earlie Server, MD;  Location: [place];  Service: Orthopedics;  Laterality: Left;  . Shoulder acromioplasty Left 05/18/2015    Procedure: SHOULDER ACROMIOPLASTY;  Surgeon: Earlie Server, MD;  Location: [place];  Service:  Orthopedics;  Laterality: Left;  . Cholecystectomy N/A 06/17/2015    Procedure: LAPAROSCOPIC CHOLECYSTECTOMY;  Surgeon: Aviva Signs, MD;  [facility];  Service: General;  Laterality: N/A;    There were no vitals filed for this visit.  Visit Diagnosis:  Left shoulder pain  Tight fascia  Shoulder weakness  Stiffness of shoulder joint, left      Subjective Assessment - 07/06/15 0913    Subjective  S: I woke up this morning and it was hurting me on the very top.    Currently in Pain? Yes   Pain Score 6    Pain Location Shoulder   Pain Orientation Left   Pain Descriptors / Indicators Aching   Pain Type  Acute pain            [facility] OT Assessment - 07/06/15 0914    Assessment   Diagnosis S/P Left SAD/DCE/RCR   Precautions   Precautions Shoulder   Precaution Comments PROM X 6 weeks (06/29/15) then progress as tolerated                  OT Treatments/Exercises (OP) - 07/06/15 0914    Exercises   Exercises Shoulder   Shoulder Exercises: Supine   Protraction PROM;5 reps;AROM;12 reps   Horizontal ABduction PROM;5 reps;AROM;12 reps   External Rotation PROM;5 reps;AROM;12 reps   Internal Rotation PROM;5 reps;AROM;12 reps   Flexion PROM;5 reps;AROM;12 reps   ABduction PROM;5 reps;AROM;12 reps   Shoulder Exercises: Standing   Protraction AROM;10 reps   Horizontal ABduction AROM;10 reps   External Rotation AROM;10 reps   Internal Rotation AROM;10 reps   Flexion AROM;10 reps   ABduction AROM;10 reps   Shoulder Exercises: ROM/Strengthening   X to V Arms 10X                  OT Short Term Goals - 06/29/15 0910    OT SHORT TERM GOAL #1   Title Patient will be educated and independent with an HEP for shoulder mobility and strength.   Time 3   Period Weeks   Status Achieved   OT SHORT TERM GOAL #2   Title Patient will improve left shoulder P/ROM to [facility] for increased ability to don and dof clothing independently.   Time 3   Period Weeks   Status Achieved   OT SHORT TERM GOAL #3   Title Patient will decrease pain in her left shoulder to 5/10 or better when completing daily tasks.   Time 3   Period Weeks   Status Achieved   OT SHORT TERM GOAL #4   Title Patient will decrease fascial restrictions to minimal in her left shoulder region for greater mobility needed for ADL completion.    Time 3   Period Weeks   Status Achieved           OT Long Term Goals - 06/29/15 0910    OT LONG TERM GOAL #1   Title Patient will return to prior level of independence with all B/IADLs and leisure activities using left arm as an active assist.   Time 6   Period Weeks    Status On-going   OT LONG TERM GOAL #2   Title Patient will improve left shoulder A/ROM to [facility] for increased ability to reach into overhead cabinets.   Time 6   Period Weeks   Status On-going   OT LONG TERM GOAL #3   Title Patient will improve left shoulder strength to 5/5 for increased ability  to lift grocery bags.   Time 6   Period Weeks   Status On-going   OT LONG TERM GOAL #4   Title Patient will decrease pain in left shoulder to 2/10 or better when getting comfortable to sleep.   Time 6   Period Weeks   Status On-going   OT LONG TERM GOAL #5   Title Patient will decrease fascial restrictions in left shoulder to trace for increased comfort when completing daily activities.    Time 6   Period Weeks   Status On-going               Plan - 07/06/15 1014    Clinical Impression Statement A: Pt reports pain during passive and active ROM of shoulder abduction. Increased muscle fatigue during standing horizontal abduction. Pt declined the UBE this date. Increased supine repetitions to 12. Pt required VC for form and technique especially with external rotation.    Plan P: Continue to work on shoulder stability and strength and A/ROM exercises. Attempt kinesiotape for shoulder stability.         Problem List Patient Active Problem List   Diagnosis Date Noted  . Cervical stenosis of spinal canal 08/06/2014  . Muscle weakness (generalized) 11/12/2013  . Tight fascia 11/12/2013  . Decreased range of motion of shoulder 11/12/2013  . Cervical spondylosis without myelopathy 10/29/2013  . S/P carpal tunnel release 02/19/2013  . CTS (carpal tunnel syndrome) 02/19/2013  . SHOULDER PAIN 10/06/2007  . IMPINGEMENT SYNDROME 10/06/2007  . RUPTURE ROTATOR CUFF 10/06/2007  . HIGH BLOOD PRESSURE 10/03/2007    Ailene Ravel, OTR/L,CBIS  [phone]  07/06/2015, 11:01 AM  [facility] [address] [place], [place], [address] Phone:  [phone]   Fax:  [phone]  Name: Kimberly [name] MRN: [id number] Date of Birth: [birth year]

## 2015-07-07 ENCOUNTER — Ambulatory Visit (HOSPITAL_COMMUNITY): Payer: 59 | Admitting: Occupational Therapy

## 2015-07-07 ENCOUNTER — Encounter (HOSPITAL_COMMUNITY): Payer: Self-pay | Admitting: Occupational Therapy

## 2015-07-07 DIAGNOSIS — M629 Disorder of muscle, unspecified: Secondary | ICD-10-CM

## 2015-07-07 DIAGNOSIS — R29898 Other symptoms and signs involving the musculoskeletal system: Secondary | ICD-10-CM

## 2015-07-07 DIAGNOSIS — M6289 Other specified disorders of muscle: Secondary | ICD-10-CM

## 2015-07-07 DIAGNOSIS — M25612 Stiffness of left shoulder, not elsewhere classified: Secondary | ICD-10-CM

## 2015-07-07 DIAGNOSIS — M25512 Pain in left shoulder: Secondary | ICD-10-CM

## 2015-07-07 NOTE — Therapy (Signed)
[place], [place], [address] Phone: [phone]   Fax:  [phone]  Occupational Therapy Treatment  Patient Details  Name: Kimberly [name] MRN: [id number] Date of Birth: 1961/06/25 Referring Provider: Dr. French Ana  Encounter Date: 07/07/2015      OT End of Session - 07/07/15 1543    Visit Number 9   Number of Visits 11   Date for OT Re-Evaluation 07/24/15   Authorization Type UHC    Authorization Time Period will need to meet goals in order for insurance to continue.  Has 11 visits remaining   Authorization - Visit Number 9   Authorization - Number of Visits 11   OT Start Time [id number]   OT Stop Time 1530   OT Time Calculation (min) 55 min   Activity Tolerance Patient tolerated treatment well   Behavior During Therapy WFL for tasks assessed/performed      Past Medical History  Diagnosis Date  . COPD (chronic obstructive pulmonary disease) ([place])   . Herpes   . Hypertension     pt. doesn't see cardiologist, followed for HTN by Dr. Gerarda Fraction  . Shortness of breath dyspnea   . Anxiety     takes alprazolam - rare use   . GERD (gastroesophageal reflux disease)     pt. reports that its better, no meds in use at this time- 2015  . Arthritis     cervical spondylosis   . History of pneumonia   . Chronic bronchitis with COPD (chronic obstructive pulmonary disease) ([place])   . Brain cyst   . Recurrent falls   . Balance problem   . Urinary frequency   . History of kidney stones   . Inflammation of shoulder joint   . Depression   . PONV (postoperative nausea and vomiting)   . Difficult intubation     Small mouth opening, limited neck flexion, very anterior     Past Surgical History  Procedure Laterality Date  . Abdominal hysterectomy    . Rotator cuff repair Left   . Foreign body removal Left     knee-as child  . Carpal tunnel release Left 02/16/2013    Procedure: CARPAL TUNNEL RELEASE;  Surgeon: Carole Civil, MD;  [facility];  Service: Orthopedics;  Laterality: Left;  . Carpal tunnel release Right 03/27/2013    Procedure: RIGHT CARPAL TUNNEL RELEASE;  Surgeon: Carole Civil, MD;  [facility];  Service: Orthopedics;  Laterality: Right;  . Cesarean section      x2  . Anterior cervical decomp/discectomy fusion N/A 08/06/2014    Procedure: ANTERIOR CERVICAL DECOMPRESSION/DISCECTOMY FUSION CERVICAL THREE-FOUR,CERVICAL SIX-SEVEN ,CERVICAL SEVEN-THORACIC ONE;  Surgeon: Floyce Stakes, MD;  Location: [place];  Service: Neurosurgery;  Laterality: N/A;  . Colonoscopy    . Nasal sinus surgery N/A 04/13/2015    Procedure: nasal endoscopy with adenoid biopsy;  Surgeon: Ruby Cola, MD;  Location: [place];  Service: ENT;  Laterality: N/A;  . Shoulder arthroscopy with distal clavicle resection Left 05/18/2015    Procedure: LEFT SHOULDER ARTHROSCOPY WITH  DISTAL CLAVICLE RESECTION;  Surgeon: Earlie Server, MD;  Location: [place];  Service: Orthopedics;  Laterality: Left;  . Shoulder acromioplasty Left 05/18/2015    Procedure: SHOULDER ACROMIOPLASTY;  Surgeon: Earlie Server, MD;  Location: [place];  Service: Orthopedics;  Laterality: Left;  . Cholecystectomy N/A 06/17/2015    Procedure: LAPAROSCOPIC CHOLECYSTECTOMY;  Surgeon: Aviva Signs, MD;  [facility];  Service: General;  Laterality: N/A;    There were no vitals filed for this visit.  Visit Diagnosis:  Left shoulder pain  Tight fascia  Shoulder weakness  Stiffness of shoulder joint, left      Subjective Assessment - 07/07/15 1437    Subjective  S: I had to go take medicine after last time.    Currently in Pain? Yes   Pain Score 5    Pain Location Shoulder   Pain Orientation Left   Pain Descriptors / Indicators Aching   Pain Type Acute pain            [facility] OT Assessment - 07/07/15 1433    Assessment   Diagnosis S/P Left SAD/DCE/RCR   Precautions   Precautions Shoulder                   OT Treatments/Exercises (OP) - 07/07/15 1438    Exercises   Exercises Shoulder   Shoulder Exercises: Supine   Protraction PROM;5 reps;AROM;12 reps   Horizontal ABduction PROM;5 reps;AROM;12 reps   External Rotation PROM;5 reps;AROM;12 reps   Internal Rotation PROM;5 reps;AROM;12 reps   Flexion PROM;5 reps;AROM;12 reps   ABduction PROM;5 reps;AROM;12 reps   Shoulder Exercises: Standing   Protraction AROM;10 reps   Horizontal ABduction AROM;10 reps   External Rotation AROM;10 reps   Internal Rotation AROM;10 reps   Flexion AROM;10 reps   ABduction AROM;10 reps   Shoulder Exercises: ROM/Strengthening   UBE (Upper Arm Bike) Level 1 2' forward 2' reverse   X to V Arms 10X   Proximal Shoulder Strengthening, Supine 10X no rest breaks   Proximal Shoulder Strengthening, Seated 10 times each without resting   Manual Therapy   Manual Therapy Myofascial release;Taping   Manual therapy comments Manual therapy completed prior to all other interventions this date   Myofascial Release Myofascial release and manual stretching to left upper arm, scapular, and shoulder region to decrease pain and fascial restrictions and improve pain free mobility   Kinesiotex --  Shoulder stability                  OT Short Term Goals - 06/29/15 0910    OT SHORT TERM GOAL #1   Title Patient will be educated and independent with an HEP for shoulder mobility and strength.   Time 3   Period Weeks   Status Achieved   OT SHORT TERM GOAL #2   Title Patient will improve left shoulder P/ROM to [facility] for increased ability to don and dof clothing independently.   Time 3   Period Weeks   Status Achieved   OT SHORT TERM GOAL #3   Title Patient will decrease pain in her left shoulder to 5/10 or better when completing daily tasks.   Time 3   Period Weeks   Status Achieved   OT SHORT TERM GOAL #4   Title Patient will decrease fascial restrictions to minimal in her left shoulder  region for greater mobility needed for ADL completion.    Time 3   Period Weeks   Status Achieved           OT Long Term Goals - 06/29/15 0910    OT LONG TERM GOAL #1   Title Patient will return to prior level of independence with all B/IADLs and leisure activities using left arm as an active assist.   Time 6   Period Weeks   Status On-going   OT LONG TERM GOAL #2  Title Patient will improve left shoulder A/ROM to [facility] for increased ability to reach into overhead cabinets.   Time 6   Period Weeks   Status On-going   OT LONG TERM GOAL #3   Title Patient will improve left shoulder strength to 5/5 for increased ability to lift grocery bags.   Time 6   Period Weeks   Status On-going   OT LONG TERM GOAL #4   Title Patient will decrease pain in left shoulder to 2/10 or better when getting comfortable to sleep.   Time 6   Period Weeks   Status On-going   OT LONG TERM GOAL #5   Title Patient will decrease fascial restrictions in left shoulder to trace for increased comfort when completing daily activities.    Time 6   Period Weeks   Status On-going               Plan - 07/07/15 1543    Clinical Impression Statement A: No complaints of increased pain during exercises this session. Pt requested to complete bike at end of session. Pt reports dizziness upon position change today, reports this has been going on at home too. OT took blood pressure, 121/91. Applied kinesiotape for increased shoulder stability, instructed pt in care and length of wear. Pt verbalized understanding.    Plan P: Follow up on kinesiotape wear & shoulder pain. Add overhead lacing and w arms.         Problem List Patient Active Problem List   Diagnosis Date Noted  . Cervical stenosis of spinal canal 08/06/2014  . Muscle weakness (generalized) 11/12/2013  . Tight fascia 11/12/2013  . Decreased range of motion of shoulder 11/12/2013  . Cervical spondylosis without myelopathy 10/29/2013  . S/P  carpal tunnel release 02/19/2013  . CTS (carpal tunnel syndrome) 02/19/2013  . SHOULDER PAIN 10/06/2007  . IMPINGEMENT SYNDROME 10/06/2007  . RUPTURE ROTATOR CUFF 10/06/2007  . HIGH BLOOD PRESSURE 10/03/2007    Guadelupe Sabin, OTR/L  [phone]  07/07/2015, 3:50 PM  [place] [address] [place], [place], [address] Phone: [phone]   Fax:  [phone]  Name: Kimberly [name] MRN: [id number] Date of Birth: [birth year]

## 2015-07-08 ENCOUNTER — Encounter (HOSPITAL_COMMUNITY): Payer: 59

## 2015-07-13 ENCOUNTER — Ambulatory Visit (HOSPITAL_COMMUNITY): Payer: 59

## 2015-07-13 ENCOUNTER — Encounter (HOSPITAL_COMMUNITY): Payer: 59 | Admitting: Specialist

## 2015-07-14 ENCOUNTER — Ambulatory Visit (HOSPITAL_COMMUNITY): Payer: 59

## 2015-07-20 ENCOUNTER — Ambulatory Visit (HOSPITAL_COMMUNITY): Payer: 59 | Admitting: Occupational Therapy

## 2015-07-20 ENCOUNTER — Encounter (HOSPITAL_COMMUNITY): Payer: Self-pay | Admitting: Occupational Therapy

## 2015-07-20 ENCOUNTER — Ambulatory Visit (HOSPITAL_COMMUNITY)
Admission: RE | Admit: 2015-07-20 | Discharge: 2015-07-20 | Disposition: A | Discharge status: Home / Self Care | Payer: 59 | Source: Ambulatory Visit | Attending: Internal Medicine | Admitting: Internal Medicine

## 2015-07-20 ENCOUNTER — Ambulatory Visit (HOSPITAL_COMMUNITY): Payer: 59

## 2015-07-20 DIAGNOSIS — M25512 Pain in left shoulder: Secondary | ICD-10-CM | POA: Diagnosis not present

## 2015-07-20 DIAGNOSIS — R921 Mammographic calcification found on diagnostic imaging of breast: Secondary | ICD-10-CM | POA: Insufficient documentation

## 2015-07-20 DIAGNOSIS — R29898 Other symptoms and signs involving the musculoskeletal system: Secondary | ICD-10-CM

## 2015-07-20 DIAGNOSIS — M6289 Other specified disorders of muscle: Secondary | ICD-10-CM

## 2015-07-20 DIAGNOSIS — Z1231 Encounter for screening mammogram for malignant neoplasm of breast: Secondary | ICD-10-CM | POA: Diagnosis present

## 2015-07-20 DIAGNOSIS — M629 Disorder of muscle, unspecified: Secondary | ICD-10-CM

## 2015-07-20 NOTE — Therapy (Signed)
[place], [place], [address] Phone: [phone]   Fax:  [phone]  Occupational Therapy Treatment  Patient Details  Name: Kimberly [name] MRN: [id number] Date of Birth: 04-25-1961 Referring Provider: Dr. French Ana  Encounter Date: 07/20/2015      OT End of Session - 07/20/15 1128    Visit Number 10   Number of Visits 11   Date for OT Re-Evaluation 07/24/15   Authorization Type UHC    Authorization Time Period will need to meet goals in order for insurance to continue.  Has 11 visits remaining   Authorization - Visit Number 10   Authorization - Number of Visits 11   OT Start Time 1016   OT Stop Time 1101   OT Time Calculation (min) 45 min   Activity Tolerance Patient tolerated treatment well   Behavior During Therapy WFL for tasks assessed/performed      Past Medical History  Diagnosis Date  . COPD (chronic obstructive pulmonary disease) ([place])   . Herpes   . Hypertension     pt. doesn't see cardiologist, followed for HTN by Dr. Gerarda Fraction  . Shortness of breath dyspnea   . Anxiety     takes alprazolam - rare use   . GERD (gastroesophageal reflux disease)     pt. reports that its better, no meds in use at this time- 2015  . Arthritis     cervical spondylosis   . History of pneumonia   . Chronic bronchitis with COPD (chronic obstructive pulmonary disease) ([place])   . Brain cyst   . Recurrent falls   . Balance problem   . Urinary frequency   . History of kidney stones   . Inflammation of shoulder joint   . Depression   . PONV (postoperative nausea and vomiting)   . Difficult intubation     Small mouth opening, limited neck flexion, very anterior     Past Surgical History  Procedure Laterality Date  . Abdominal hysterectomy    . Rotator cuff repair Left   . Foreign body removal Left     knee-as child  . Carpal tunnel release Left 02/16/2013    Procedure: CARPAL TUNNEL RELEASE;  Surgeon: Carole Civil, MD;  [facility];  Service: Orthopedics;  Laterality: Left;  . Carpal tunnel release Right 03/27/2013    Procedure: RIGHT CARPAL TUNNEL RELEASE;  Surgeon: Carole Civil, MD;  [facility];  Service: Orthopedics;  Laterality: Right;  . Cesarean section      x2  . Anterior cervical decomp/discectomy fusion N/A 08/06/2014    Procedure: ANTERIOR CERVICAL DECOMPRESSION/DISCECTOMY FUSION CERVICAL THREE-FOUR,CERVICAL SIX-SEVEN ,CERVICAL SEVEN-THORACIC ONE;  Surgeon: Floyce Stakes, MD;  Location: [place];  Service: Neurosurgery;  Laterality: N/A;  . Colonoscopy    . Nasal sinus surgery N/A 04/13/2015    Procedure: nasal endoscopy with adenoid biopsy;  Surgeon: Ruby Cola, MD;  Location: [place];  Service: ENT;  Laterality: N/A;  . Shoulder arthroscopy with distal clavicle resection Left 05/18/2015    Procedure: LEFT SHOULDER ARTHROSCOPY WITH  DISTAL CLAVICLE RESECTION;  Surgeon: Earlie Server, MD;  Location: [place];  Service: Orthopedics;  Laterality: Left;  . Shoulder acromioplasty Left 05/18/2015    Procedure: SHOULDER ACROMIOPLASTY;  Surgeon: Earlie Server, MD;  Location: [place];  Service: Orthopedics;  Laterality: Left;  . Cholecystectomy N/A 06/17/2015    Procedure: LAPAROSCOPIC CHOLECYSTECTOMY;  Surgeon: Aviva Signs, MD;  [facility];  Service: General;  Laterality: N/A;    There were no vitals filed for this visit.  Visit Diagnosis:  Left shoulder pain  Tight fascia  Shoulder weakness      Subjective Assessment - 07/20/15 1018    Subjective  S: That tape really seemed to help.    Currently in Pain? Yes   Pain Score 3    Pain Location Shoulder   Pain Orientation Left   Pain Descriptors / Indicators Aching   Pain Type Acute pain            [facility] OT Assessment - 07/20/15 1012    Assessment   Diagnosis S/P Left SAD/DCE/RCR   Precautions   Precautions Shoulder   Precaution Comments PROM X 6 weeks (06/29/15)  then progress as tolerated                  OT Treatments/Exercises (OP) - 07/20/15 1019    Exercises   Exercises Shoulder   Shoulder Exercises: Supine   Protraction PROM;5 reps   Horizontal ABduction PROM;5 reps   External Rotation PROM;5 reps   Internal Rotation PROM;5 reps   Flexion PROM;5 reps   ABduction PROM;5 reps   Shoulder Exercises: Standing   Protraction AROM;10 reps   Horizontal ABduction AROM;10 reps   External Rotation AROM;10 reps   Internal Rotation AROM;10 reps   Flexion AROM;10 reps   ABduction AROM;10 reps   Extension Theraband;10 reps   Theraband Level (Shoulder Extension) Level 3 (Green)   Row [employer] reps   Theraband Level (Shoulder Row) Level 3 (Green)   Retraction Theraband;10 reps   Theraband Level (Shoulder Retraction) Level 3 (Green)   Shoulder Exercises: ROM/Strengthening   UBE (Upper Arm Bike) Level 1 3' forward 3' reverse   "W" Arms 10X   X to V Arms 12X   Manual Therapy   Manual Therapy Myofascial release;Taping   Manual therapy comments Manual therapy completed prior to all other interventions this date   Myofascial Release Myofascial release and manual stretching to left upper arm, scapular, and shoulder region to decrease pain and fascial restrictions and improve pain free mobility   Kinesiotex --  shoulder stability                  OT Short Term Goals - 06/29/15 0910    OT SHORT TERM GOAL #1   Title Patient will be educated and independent with an HEP for shoulder mobility and strength.   Time 3   Period Weeks   Status Achieved   OT SHORT TERM GOAL #2   Title Patient will improve left shoulder P/ROM to [facility] for increased ability to don and dof clothing independently.   Time 3   Period Weeks   Status Achieved   OT SHORT TERM GOAL #3   Title Patient will decrease pain in her left shoulder to 5/10 or better when completing daily tasks.   Time 3   Period Weeks   Status Achieved   OT SHORT TERM GOAL #4    Title Patient will decrease fascial restrictions to minimal in her left shoulder region for greater mobility needed for ADL completion.    Time 3   Period Weeks   Status Achieved           OT Long Term Goals - 06/29/15 0910    OT LONG TERM GOAL #1   Title Patient will return to prior level of independence with all B/IADLs and leisure activities using left  arm as an active assist.   Time 6   Period Weeks   Status On-going   OT LONG TERM GOAL #2   Title Patient will improve left shoulder A/ROM to [facility] for increased ability to reach into overhead cabinets.   Time 6   Period Weeks   Status On-going   OT LONG TERM GOAL #3   Title Patient will improve left shoulder strength to 5/5 for increased ability to lift grocery bags.   Time 6   Period Weeks   Status On-going   OT LONG TERM GOAL #4   Title Patient will decrease pain in left shoulder to 2/10 or better when getting comfortable to sleep.   Time 6   Period Weeks   Status On-going   OT LONG TERM GOAL #5   Title Patient will decrease fascial restrictions in left shoulder to trace for increased comfort when completing daily activities.    Time 6   Period Weeks   Status On-going               Plan - 07/20/15 1129    Clinical Impression Statement A: Pt reports the kinesiotape seemed to help with pain and shoulder stability since previous session, requested new taping this session. Added w arms, resumed theraband  this session. Pt continues to report dizziness at times. Pt required intermittent verbal cuing for form during exercises.    Plan P: Follow up on new kinesiotape. Reassess and determine if insurance will continue and if not discharge pt with HEP.         Problem List Patient Active Problem List   Diagnosis Date Noted  . Cervical stenosis of spinal canal 08/06/2014  . Muscle weakness (generalized) 11/12/2013  . Tight fascia 11/12/2013  . Decreased range of motion of shoulder 11/12/2013  . Cervical  spondylosis without myelopathy 10/29/2013  . S/P carpal tunnel release 02/19/2013  . CTS (carpal tunnel syndrome) 02/19/2013  . SHOULDER PAIN 10/06/2007  . IMPINGEMENT SYNDROME 10/06/2007  . RUPTURE ROTATOR CUFF 10/06/2007  . HIGH BLOOD PRESSURE 10/03/2007    Guadelupe Sabin, OTR/L  [phone]  07/20/2015, 11:32 AM  [place] [address] [place], [place], [address] Phone: [phone]   Fax:  [phone]  Name: Kimberly [name] MRN: [id number] Date of Birth: [birth year]

## 2015-07-22 ENCOUNTER — Ambulatory Visit (HOSPITAL_COMMUNITY): Payer: 59 | Admitting: Occupational Therapy

## 2015-07-22 ENCOUNTER — Encounter (HOSPITAL_COMMUNITY): Payer: Self-pay | Admitting: Occupational Therapy

## 2015-07-22 DIAGNOSIS — M25512 Pain in left shoulder: Secondary | ICD-10-CM | POA: Diagnosis not present

## 2015-07-22 DIAGNOSIS — R29898 Other symptoms and signs involving the musculoskeletal system: Secondary | ICD-10-CM

## 2015-07-22 DIAGNOSIS — M25612 Stiffness of left shoulder, not elsewhere classified: Secondary | ICD-10-CM

## 2015-07-22 DIAGNOSIS — M6289 Other specified disorders of muscle: Secondary | ICD-10-CM

## 2015-07-22 DIAGNOSIS — M629 Disorder of muscle, unspecified: Secondary | ICD-10-CM

## 2015-07-22 NOTE — Therapy (Signed)
[place], [place], [address] Phone: [phone]   Fax:  [phone]  Occupational Therapy Reassessment, Treatment, and Discharge  Patient Details  Name: Kimberly [name] MRN: [id number] Date of Birth: 1961/01/11 Referring Provider: Dr. French Ana  Encounter Date: 07/22/2015      OT End of Session - 07/22/15 1208    Visit Number 11   Number of Visits 11   Date for OT Re-Evaluation 07/24/15   Authorization Type [employer]    Authorization Time Period will need to meet goals in order for insurance to continue.  Has 11 visits remaining   Authorization - Visit Number 11   Authorization - Number of Visits 11   OT Start Time 1104   OT Stop Time 1146   OT Time Calculation (min) 42 min   Activity Tolerance Patient tolerated treatment well   Behavior During Therapy WFL for tasks assessed/performed      Past Medical History  Diagnosis Date  . COPD (chronic obstructive pulmonary disease) ([place])   . Herpes   . Hypertension     pt. doesn't see cardiologist, followed for HTN by Dr. Gerarda Fraction  . Shortness of breath dyspnea   . Anxiety     takes alprazolam - rare use   . GERD (gastroesophageal reflux disease)     pt. reports that its better, no meds in use at this time- 2015  . Arthritis     cervical spondylosis   . History of pneumonia   . Chronic bronchitis with COPD (chronic obstructive pulmonary disease) ([place])   . Brain cyst   . Recurrent falls   . Balance problem   . Urinary frequency   . History of kidney stones   . Inflammation of shoulder joint   . Depression   . PONV (postoperative nausea and vomiting)   . Difficult intubation     Small mouth opening, limited neck flexion, very anterior     Past Surgical History  Procedure Laterality Date  . Abdominal hysterectomy    . Rotator cuff repair Left   . Foreign body removal Left     knee-as child  . Carpal tunnel release Left 02/16/2013    Procedure: CARPAL TUNNEL  RELEASE;  Surgeon: Carole Civil, MD;  Location: [facility];  Service: Orthopedics;  Laterality: Left;  . Carpal tunnel release Right 03/27/2013    Procedure: RIGHT CARPAL TUNNEL RELEASE;  Surgeon: Carole Civil, MD;  Location: [facility];  Service: Orthopedics;  Laterality: Right;  . Cesarean section      x2  . Anterior cervical decomp/discectomy fusion N/A 08/06/2014    Procedure: ANTERIOR CERVICAL DECOMPRESSION/DISCECTOMY FUSION CERVICAL THREE-FOUR,CERVICAL SIX-SEVEN ,CERVICAL SEVEN-THORACIC ONE;  Surgeon: Floyce Stakes, MD;  Location: [facility];  Service: Neurosurgery;  Laterality: N/A;  . Colonoscopy    . Nasal sinus surgery N/A 04/13/2015    Procedure: nasal endoscopy with adenoid biopsy;  Surgeon: Ruby Cola, MD;  Location: [facility];  Service: ENT;  Laterality: N/A;  . Shoulder arthroscopy with distal clavicle resection Left 05/18/2015    Procedure: LEFT SHOULDER ARTHROSCOPY WITH  DISTAL CLAVICLE RESECTION;  Surgeon: Earlie Server, MD;  Location: [facility];  Service: Orthopedics;  Laterality: Left;  . Shoulder acromioplasty Left 05/18/2015    Procedure: SHOULDER ACROMIOPLASTY;  Surgeon: Earlie Server, MD;  Location: [facility];  Service: Orthopedics;  Laterality: Left;  . Cholecystectomy N/A 06/17/2015    Procedure: LAPAROSCOPIC CHOLECYSTECTOMY;  Surgeon: Aviva Signs, MD;  Location: [facility];  Service: General;  Laterality: N/A;    There were no vitals filed for this visit.  Visit Diagnosis:  Left shoulder pain  Tight fascia  Shoulder weakness  Stiffness of shoulder joint, left      Subjective Assessment - 07/22/15 1107    Subjective  S: I can do most everything I want, I just need to take breaks.    Currently in Pain? Yes   Pain Score 4    Pain Location Shoulder   Pain Orientation Left   Pain Descriptors / Indicators Aching   Pain Type Acute pain           [facility] OT Assessment - 07/22/15 1106    Assessment   Diagnosis S/P Left  SAD/DCE/RCR   Precautions   Precautions Shoulder   Precaution Comments PROM X 6 weeks (06/29/15) then progress as tolerated   Palpation   Palpation comment min fascial restrictions in left shoulder region    AROM   Overall AROM Comments assessed seated, ER/IR assessed with shoulder abducted    Left Shoulder Flexion 175 Degrees  previous 140   Left Shoulder ABduction 180 Degrees  previous 95   Left Shoulder Internal Rotation 90 Degrees  previous 50   Left Shoulder External Rotation 78 Degrees  previous 70   PROM   Overall PROM Comments P/ROM is WNL in supine   Strength   Left Shoulder Flexion 4/5  previous 3+/5   Left Shoulder ABduction 4-/5  previous 3+/5   Left Shoulder Internal Rotation 4/5  previous 4-/5   Left Shoulder External Rotation 4-/5  previous 3+/5                  OT Treatments/Exercises (OP) - 07/22/15 1134    Exercises   Exercises Shoulder   Shoulder Exercises: Supine   Protraction PROM;5 reps;Strengthening;10 reps   Protraction Weight (lbs) 1   Horizontal ABduction PROM;5 reps;Strengthening;10 reps   Horizontal ABduction Weight (lbs) 1   External Rotation PROM;5 reps;Strengthening;10 reps   External Rotation Weight (lbs) 1   Internal Rotation PROM;5 reps;Strengthening;10 reps   Internal Rotation Weight (lbs) 1   Flexion PROM;5 reps;Strengthening;10 reps   Shoulder Flexion Weight (lbs) 1   ABduction PROM;5 reps;Strengthening;10 reps   Shoulder ABduction Weight (lbs) 1   Manual Therapy   Manual Therapy Myofascial release   Manual therapy comments Manual therapy completed prior to all other interventions this date   Myofascial Release Myofascial release and manual stretching to left upper arm, scapular, and shoulder region to decrease pain and fascial restrictions and improve pain free mobility               OT Education - 07/22/15 1207    Education provided Yes   Education Details Educated pt on adding 1# weight to A/ROM exercises  for strengthening, provided green t-band HEP for strengthening    Person(s) Educated Patient   Methods Explanation;Demonstration;Handout   Comprehension Verbalized understanding;Returned demonstration          OT Short Term Goals - 07/22/15 1128    OT SHORT TERM GOAL #1   Title Patient will be educated and independent with an HEP for shoulder mobility and strength.   Time 3   Period Weeks   Status Achieved   OT SHORT TERM GOAL #2   Title Patient will improve left shoulder P/ROM to [facility] for increased ability to don and dof clothing independently.   Time 3   Period Weeks  Status Achieved   OT SHORT TERM GOAL #3   Title Patient will decrease pain in her left shoulder to 5/10 or better when completing daily tasks.   Time 3   Period Weeks   Status Achieved   OT SHORT TERM GOAL #4   Title Patient will decrease fascial restrictions to minimal in her left shoulder region for greater mobility needed for ADL completion.    Time 3   Period Weeks   Status Achieved           OT Long Term Goals - 07/22/15 1129    OT LONG TERM GOAL #1   Title Patient will return to prior level of independence with all B/IADLs and leisure activities using left arm as an active assist.   Time 6   Period Weeks   Status Achieved   OT LONG TERM GOAL #2   Title Patient will improve left shoulder A/ROM to [facility] for increased ability to reach into overhead cabinets.   Time 6   Period Weeks   Status Achieved   OT LONG TERM GOAL #3   Title Patient will improve left shoulder strength to 5/5 for increased ability to lift grocery bags.   Time 6   Period Weeks   Status Not Met   OT LONG TERM GOAL #4   Title Patient will decrease pain in left shoulder to 2/10 or better when getting comfortable to sleep.   Time 6   Period Weeks   Status Not Met   OT LONG TERM GOAL #5   Title Patient will decrease fascial restrictions in left shoulder to trace for increased comfort when completing daily activities.    Time 6    Period Weeks   Status Not Met               Plan - 07/22/15 1208    Clinical Impression Statement A: Mini-reassessment completed this session, pt has met all STGs and has met 2/5 LTGs. Pt's insurance will not pay for additional visits unless all goals have been met. Pt has elected to complete HEP at home for LUE strengthening. Pt was educated on adding 1# weight to A/ROM exercises for strengthening of LUE, also provided with green theraband strengthening exercises. Pt is agreeable to discharge.    Plan P: Discharge pt.         Problem List Patient Active Problem List   Diagnosis Date Noted  . Cervical stenosis of spinal canal 08/06/2014  . Muscle weakness (generalized) 11/12/2013  . Tight fascia 11/12/2013  . Decreased range of motion of shoulder 11/12/2013  . Cervical spondylosis without myelopathy 10/29/2013  . S/P carpal tunnel release 02/19/2013  . CTS (carpal tunnel syndrome) 02/19/2013  . SHOULDER PAIN 10/06/2007  . IMPINGEMENT SYNDROME 10/06/2007  . RUPTURE ROTATOR CUFF 10/06/2007  . HIGH BLOOD PRESSURE 10/03/2007    Guadelupe Sabin, OTR/L  [phone]  07/22/2015, 12:11 PM  [place], [place], [address] Phone: [phone]   Fax:  [phone]  Name: Kimberly [name] MRN: [id number] Date of Birth: 07-02-1961   OCCUPATIONAL THERAPY DISCHARGE SUMMARY  Visits from Start of Care: 11  Current functional level related to goals / functional outcomes: See above goals. Pt demonstrates LUE ROM WFL and is able to complete all ADL tasks at prior level of independence.    Remaining deficits: Pt continues to have difficulty sleeping at times due to pain, also takes breaks during daily tasks due to  muscle fatigue. Pt demonstrates decreased strength in LUE compared to baseline.    Education / Equipment: Educated pt on adding 1# weight to A/ROM exercises, provided instructions on rice sock heating  pad for pain management. Also provided pt with green theraband strengthening exercises.  Plan: Patient agrees to discharge.  Patient goals were partially met. Patient is being discharged due to meeting the stated rehab goals.  ?????    Pt's insurance will not continue to pay for treatment visits unless all goals are met.

## 2015-07-22 NOTE — Patient Instructions (Signed)
1) Add 1 pound weight to A/ROM exercise plan. A bottle of water or soup can will work fine.    2) Strengthening Exercises with Theraband  Strengthening: Chest Pull - Resisted   Hold Theraband in front of body with hands about shoulder width a part. Pull band a part and back together slowly. Repeat _10___ times. Complete __1__ set(s) per session.. Repeat __2__ session(s) per day.  http://orth.exer.us/926   Copyright  VHI. All rights reserved.   PNF Strengthening: Resisted   Standing with resistive band around each hand, bring right arm up and away, thumb back. Repeat __10__ times per set. Do __1__ sets per session. Do __2__ sessions per day.  http://orth.exer.us/918   Copyright  VHI. All rights reserved.   PNF Strengthening: Resisted   Standing with resistive band around each hand, bring right arm up and across body. Repeat _10___ times per set. Do _1___ sets per session. Do _2___ sessions per day.  http://orth.exer.us/920   Copyright  VHI. All rights reserved.    Resisted External Rotation: in Neutral - Bilateral   Sit or stand, tubing in both hands, elbows at sides, bent to 90, forearms forward. Pinch shoulder blades together and rotate forearms out. Keep elbows at sides. Repeat __10__ times per set. Do _1___ sets per session. Do __2__ sessions per day.  http://orth.exer.us/966   Copyright  VHI. All rights reserved.   PNF Strengthening: Resisted   Standing, hold resistive band above head. Bring right arm down and out from side. Repeat __10__ times per set. Do __1__ sets per session. Do __2__ sessions per day.  http://orth.exer.us/922   Copyright  VHI. All rights reserved.       3) Heating pad for pain management Rice Sock Heating Pad  1) Get a clean cotton sock and fill it with uncooked rice. Don't pack it in tightly; leave some room for the grains to move around so that it will more easily conform to the area to be treated.  2) Use a thick  athletic type sock, so grain will not stick through. Tie the open end of the sock with yarn, ribbon, or string. It's also possible to tie the end of a longer sock to keep the grain from spilling out, and this is more reliable.  3) Put the filled sock in a microwave oven for approximately 1-3 minutes. When you heat the rice sock place a cup of water in the microwave to add moisture during the heating process.   4) Shake the rice/grain sock to evenly distribute the heated rice.

## 2015-07-26 ENCOUNTER — Other Ambulatory Visit: Payer: Self-pay | Admitting: Internal Medicine

## 2015-07-26 DIAGNOSIS — R928 Other abnormal and inconclusive findings on diagnostic imaging of breast: Secondary | ICD-10-CM

## 2015-07-27 ENCOUNTER — Ambulatory Visit (HOSPITAL_COMMUNITY): Payer: 59

## 2015-07-29 ENCOUNTER — Ambulatory Visit (HOSPITAL_COMMUNITY): Payer: 59 | Admitting: Occupational Therapy

## 2015-07-31 ENCOUNTER — Emergency Department (HOSPITAL_COMMUNITY): Payer: BLUE CROSS/BLUE SHIELD

## 2015-07-31 ENCOUNTER — Encounter (HOSPITAL_COMMUNITY): Payer: Self-pay | Admitting: Emergency Medicine

## 2015-07-31 ENCOUNTER — Emergency Department (HOSPITAL_COMMUNITY)
Admission: EM | Admit: 2015-07-31 | Discharge: 2015-07-31 | Disposition: A | Discharge status: Home / Self Care | Payer: BLUE CROSS/BLUE SHIELD | Attending: Emergency Medicine | Admitting: Emergency Medicine

## 2015-07-31 DIAGNOSIS — J449 Chronic obstructive pulmonary disease, unspecified: Secondary | ICD-10-CM | POA: Insufficient documentation

## 2015-07-31 DIAGNOSIS — R52 Pain, unspecified: Secondary | ICD-10-CM

## 2015-07-31 DIAGNOSIS — F329 Major depressive disorder, single episode, unspecified: Secondary | ICD-10-CM | POA: Insufficient documentation

## 2015-07-31 DIAGNOSIS — Z7982 Long term (current) use of aspirin: Secondary | ICD-10-CM | POA: Insufficient documentation

## 2015-07-31 DIAGNOSIS — N83202 Unspecified ovarian cyst, left side: Secondary | ICD-10-CM | POA: Insufficient documentation

## 2015-07-31 DIAGNOSIS — Z8619 Personal history of other infectious and parasitic diseases: Secondary | ICD-10-CM | POA: Insufficient documentation

## 2015-07-31 DIAGNOSIS — Z9889 Other specified postprocedural states: Secondary | ICD-10-CM | POA: Diagnosis not present

## 2015-07-31 DIAGNOSIS — Z90710 Acquired absence of both cervix and uterus: Secondary | ICD-10-CM | POA: Insufficient documentation

## 2015-07-31 DIAGNOSIS — R102 Pelvic and perineal pain: Secondary | ICD-10-CM

## 2015-07-31 DIAGNOSIS — Z8739 Personal history of other diseases of the musculoskeletal system and connective tissue: Secondary | ICD-10-CM | POA: Insufficient documentation

## 2015-07-31 DIAGNOSIS — Z8669 Personal history of other diseases of the nervous system and sense organs: Secondary | ICD-10-CM | POA: Diagnosis not present

## 2015-07-31 DIAGNOSIS — Z79899 Other long term (current) drug therapy: Secondary | ICD-10-CM | POA: Insufficient documentation

## 2015-07-31 DIAGNOSIS — I10 Essential (primary) hypertension: Secondary | ICD-10-CM | POA: Diagnosis not present

## 2015-07-31 DIAGNOSIS — Z7951 Long term (current) use of inhaled steroids: Secondary | ICD-10-CM | POA: Diagnosis not present

## 2015-07-31 DIAGNOSIS — Z87442 Personal history of urinary calculi: Secondary | ICD-10-CM | POA: Diagnosis not present

## 2015-07-31 DIAGNOSIS — Z8719 Personal history of other diseases of the digestive system: Secondary | ICD-10-CM | POA: Insufficient documentation

## 2015-07-31 DIAGNOSIS — Z8701 Personal history of pneumonia (recurrent): Secondary | ICD-10-CM | POA: Insufficient documentation

## 2015-07-31 DIAGNOSIS — F419 Anxiety disorder, unspecified: Secondary | ICD-10-CM | POA: Insufficient documentation

## 2015-07-31 LAB — URINE MICROSCOPIC-ADD ON

## 2015-07-31 LAB — COMPREHENSIVE METABOLIC PANEL
ALBUMIN: 4.1 g/dL (ref 3.5–5.0)
ALT: 17 U/L (ref 14–54)
ANION GAP: 11 (ref 5–15)
AST: 26 U/L (ref 15–41)
Alkaline Phosphatase: 93 U/L (ref 38–126)
BUN: 7 mg/dL (ref 6–20)
CALCIUM: 9.6 mg/dL (ref 8.9–10.3)
CO2: 26 mmol/L (ref 22–32)
Chloride: 106 mmol/L (ref 101–111)
Creatinine, Ser: 0.61 mg/dL (ref 0.44–1.00)
GFR calc Af Amer: >60 mL/min (ref >60)
GFR calc non Af Amer: >60 mL/min (ref >60)
GLUCOSE: 97 mg/dL (ref 65–99)
POTASSIUM: 3.9 mmol/L (ref 3.5–5.1)
SODIUM: 143 mmol/L (ref 135–145)
Total Bilirubin: 1.1 mg/dL (ref 0.3–1.2)
Total Protein: 8.2 g/dL — ABNORMAL HIGH (ref 6.5–8.1)

## 2015-07-31 LAB — URINALYSIS, ROUTINE W REFLEX MICROSCOPIC
BILIRUBIN URINE: NEGATIVE
GLUCOSE, UA: NEGATIVE mg/dL
Ketones, ur: NEGATIVE mg/dL
Leukocytes, UA: NEGATIVE
Nitrite: NEGATIVE
PH: 7 (ref 5.0–8.0)
Protein, ur: NEGATIVE mg/dL
SPECIFIC GRAVITY, URINE: 1.01 (ref 1.005–1.030)

## 2015-07-31 LAB — CBC
HCT: 42 % (ref 36.0–46.0)
HEMOGLOBIN: 13.8 g/dL (ref 12.0–15.0)
MCH: 30.7 pg (ref 26.0–34.0)
MCHC: 32.9 g/dL (ref 30.0–36.0)
MCV: 93.3 fL (ref 78.0–100.0)
Platelets: 274 10*3/uL (ref 150–400)
RBC: 4.5 MIL/uL (ref 3.87–5.11)
RDW: 12.1 % (ref 11.5–15.5)
WBC: 7.5 10*3/uL (ref 4.0–10.5)

## 2015-07-31 LAB — LIPASE, BLOOD: LIPASE: 26 U/L (ref 11–51)

## 2015-07-31 MED ORDER — SODIUM CHLORIDE 0.9 % IJ SOLN
INTRAMUSCULAR | Status: AC
  Filled 2015-07-31: qty 1000

## 2015-07-31 MED ORDER — IBUPROFEN 800 MG PO TABS: 800.0000 mg | ORAL_TABLET | Freq: Three times a day (TID) | ORAL | Status: DC

## 2015-07-31 MED ORDER — DIATRIZOATE MEGLUMINE & SODIUM 66-10 % PO SOLN
ORAL | Status: AC
  Filled 2015-07-31: qty 30

## 2015-07-31 MED ORDER — HYDROMORPHONE HCL 1 MG/ML IJ SOLN
1.0000 mg | Freq: Once | INTRAMUSCULAR | Status: AC
  Administered 2015-07-31: 1 mg via INTRAVENOUS
  Filled 2015-07-31: qty 1

## 2015-07-31 MED ORDER — IOHEXOL 300 MG/ML  SOLN
100.0000 mL | Freq: Once | INTRAMUSCULAR | Status: AC | PRN
  Administered 2015-07-31: 100 mL via INTRAVENOUS

## 2015-07-31 MED ORDER — ONDANSETRON HCL 4 MG/2ML IJ SOLN
4.0000 mg | Freq: Once | INTRAMUSCULAR | Status: AC
  Administered 2015-07-31: 4 mg via INTRAVENOUS
  Filled 2015-07-31: qty 2

## 2015-07-31 MED ORDER — HYDROCODONE-ACETAMINOPHEN 5-325 MG PO TABS: 1.0000 | ORAL_TABLET | ORAL | Status: DC | PRN

## 2015-07-31 NOTE — Discharge Instructions (Signed)
Abdominal Pain, Adult °Many things can cause abdominal pain. Usually, abdominal pain is not caused by a disease and will improve without treatment. It can often be observed and treated at home. Your health care provider will do a physical exam and possibly order blood tests and X-rays to help determine the seriousness of your pain. However, in many cases, more time must pass before a clear cause of the pain can be found. Before that point, your health care provider may not know if you need more testing or further treatment. °HOME CARE INSTRUCTIONS °Monitor your abdominal pain for any changes. The following actions may help to alleviate any discomfort you are experiencing: °· Only take over-the-counter or prescription medicines as directed by your health care provider. °· Do not take laxatives unless directed to do so by your health care provider. °· Try a clear liquid diet (broth, tea, or water) as directed by your health care provider. Slowly move to a bland diet as tolerated. °SEEK MEDICAL CARE IF: °· You have unexplained abdominal pain. °· You have abdominal pain associated with nausea or diarrhea. °· You have pain when you urinate or have a bowel movement. °· You experience abdominal pain that wakes you in the night. °· You have abdominal pain that is worsened or improved by eating food. °· You have abdominal pain that is worsened with eating fatty foods. °· You have a fever. °SEEK IMMEDIATE MEDICAL CARE IF: °· Your pain does not go away within 2 hours. °· You keep throwing up (vomiting). °· Your pain is felt only in portions of the abdomen, such as the right side or the left lower portion of the abdomen. °· You pass bloody or black tarry stools. °MAKE SURE YOU: °· Understand these instructions. °· Will watch your condition. °· Will get help right away if you are not doing well or get worse. °  °This information is not intended to replace advice given to you by your health care provider. Make sure you discuss  any questions you have with your health care provider. °  °Document Released: 04/25/2005 Document Revised: 04/06/2015 Document Reviewed: 03/25/2013 °Elsevier Interactive Patient Education ©2016 [employer] °Ovarian Cyst °An ovarian cyst is a fluid-filled sac that forms on an ovary. The ovaries are small organs that produce eggs in women. Various types of cysts can form on the ovaries. Most are not cancerous. Many do not cause problems, and they often go away on their own. Some may cause symptoms and require treatment. Common types of ovarian cysts include: °· Functional cysts--These cysts may occur every month during the menstrual cycle. This is normal. The cysts usually go away with the next menstrual cycle if the woman does not get pregnant. Usually, there are no symptoms with a functional cyst. °· Endometrioma cysts--These cysts form from the tissue that lines the uterus. They are also called "chocolate cysts" because they become filled with blood that turns brown. This type of cyst can cause pain in the lower abdomen during intercourse and with your menstrual period. °· Cystadenoma cysts--This type develops from the cells on the outside of the ovary. These cysts can get very big and cause lower abdomen pain and pain with intercourse. This type of cyst can twist on itself, cut off its blood supply, and cause severe pain. It can also easily rupture and cause a lot of pain. °· Dermoid cysts--This type of cyst is sometimes found in both ovaries. These cysts may contain different kinds of body tissue, such as   skin, teeth, hair, or cartilage. They usually do not cause symptoms unless they get very big. °· Theca lutein cysts--These cysts occur when too much of a certain hormone (human chorionic gonadotropin) is produced and overstimulates the ovaries to produce an egg. This is most common after procedures used to assist with the conception of a baby (in vitro fertilization). °CAUSES  °· Fertility drugs can cause a  condition in which multiple large cysts are formed on the ovaries. This is called ovarian hyperstimulation syndrome. °· A condition called polycystic ovary syndrome can cause hormonal imbalances that can lead to nonfunctional ovarian cysts. °SIGNS AND SYMPTOMS  °Many ovarian cysts do not cause symptoms. If symptoms are present, they may include: °· Pelvic pain or pressure. °· Pain in the lower abdomen. °· Pain during sexual intercourse. °· Increasing girth (swelling) of the abdomen. °· Abnormal menstrual periods. °· Increasing pain with menstrual periods. °· Stopping having menstrual periods without being pregnant. °DIAGNOSIS  °These cysts are commonly found during a routine or annual pelvic exam. Tests may be ordered to find out more about the cyst. These tests may include: °· Ultrasound. °· X-ray of the pelvis. °· CT scan. °· MRI. °· Blood tests. °TREATMENT  °Many ovarian cysts go away on their own without treatment. Your health care provider may want to check your cyst regularly for 2-3 months to see if it changes. For women in menopause, it is particularly important to monitor a cyst closely because of the higher rate of ovarian cancer in menopausal women. When treatment is needed, it may include any of the following: °· A procedure to drain the cyst (aspiration). This may be done using a long needle and ultrasound. It can also be done through a laparoscopic procedure. This involves using a thin, lighted tube with a tiny camera on the end (laparoscope) inserted through a small incision. °· Surgery to remove the whole cyst. This may be done using laparoscopic surgery or an open surgery involving a larger incision in the lower abdomen. °· Hormone treatment or birth control pills. These methods are sometimes used to help dissolve a cyst. °HOME CARE INSTRUCTIONS  °· Only take over-the-counter or prescription medicines as directed by your health care provider. °· Follow up with your health care provider as  directed. °· Get regular pelvic exams and Pap tests. °SEEK MEDICAL CARE IF:  °· Your periods are late, irregular, or painful, or they stop. °· Your pelvic pain or abdominal pain does not go away. °· Your abdomen becomes larger or swollen. °· You have pressure on your bladder or trouble emptying your bladder completely. °· You have pain during sexual intercourse. °· You have feelings of fullness, pressure, or discomfort in your stomach. °· You lose weight for no apparent reason. °· You feel generally ill. °· You become constipated. °· You lose your appetite. °· You develop acne. °· You have an increase in body and facial hair. °· You are gaining weight, without changing your exercise and eating habits. °· You think you are pregnant. °SEEK IMMEDIATE MEDICAL CARE IF:  °· You have increasing abdominal pain. °· You feel sick to your stomach (nauseous), and you throw up (vomit). °· You develop a fever that comes on suddenly. °· You have abdominal pain during a bowel movement. °· Your menstrual periods become heavier than usual. °MAKE SURE YOU: °· Understand these instructions. °· Will watch your condition. °· Will get help right away if you are not doing well or get worse. °  °  This information is not intended to replace advice given to you by your health care provider. Make sure you discuss any questions you have with your health care provider. °  °Document Released: 07/16/2005 Document Revised: 07/21/2013 Document Reviewed: 03/23/2013 °Elsevier Interactive Patient Education ©2016 [employer] Inc. ° °

## 2015-07-31 NOTE — ED Notes (Signed)
Pt reports pelvic pain that began at 0830 this morning. Pt also reports a "stabbing pain" in her vagina. States this has happened 3 times in the past two years. Denies v/d. States it hurts to urinate.

## 2015-07-31 NOTE — ED Provider Notes (Signed)
CSN: [id number]     Arrival date & time 07/31/15  [id number] History   First MD Initiated Contact with Patient 07/31/15 [phone]     Chief Complaint  Patient presents with  . Pelvic Pain     (Consider location/radiation/quality/duration/timing/severity/associated sxs/prior Treatment) Patient is a 55 y.o. female presenting with abdominal pain. The history is provided by the patient.  Abdominal Pain Pain location:  LLQ and suprapubic Pain quality: aching   Pain radiates to:  Does not radiate Pain severity:  Moderate Onset quality:  Gradual Duration:  2 hours Timing:  Constant Progression:  Worsening Chronicity:  New Context: sick contacts and suspicious food intake   Context: not recent illness   Relieved by:  Nothing Worsened by:  Nothing tried Ineffective treatments:  None tried Associated symptoms: no nausea, no vaginal bleeding and no vaginal discharge   Risk factors: no alcohol abuse   Pt complains of lower abdominal pain.  Pain is sharp and stabbing.  Pt has had happen in the past but pain always resolved.  Past Medical History  Diagnosis Date  . COPD (chronic obstructive pulmonary disease) ([place])   . Herpes   . Hypertension     pt. doesn't see cardiologist, followed for HTN by Dr. Gerarda Fraction  . Shortness of breath dyspnea   . Anxiety     takes alprazolam - rare use   . GERD (gastroesophageal reflux disease)     pt. reports that its better, no meds in use at this time- 2015  . Arthritis     cervical spondylosis   . History of pneumonia   . Chronic bronchitis with COPD (chronic obstructive pulmonary disease) ([place])   . Brain cyst   . Recurrent falls   . Balance problem   . Urinary frequency   . History of kidney stones   . Inflammation of shoulder joint   . Depression   . PONV (postoperative nausea and vomiting)   . Difficult intubation     Small mouth opening, limited neck flexion, very anterior    Past Surgical History  Procedure Laterality Date  . Abdominal hysterectomy     . Rotator cuff repair Left   . Foreign body removal Left     knee-as child  . Carpal tunnel release Left 02/16/2013    Procedure: CARPAL TUNNEL RELEASE;  Surgeon: Carole Civil, MD;  Location: [facility];  Service: Orthopedics;  Laterality: Left;  . Carpal tunnel release Right 03/27/2013    Procedure: RIGHT CARPAL TUNNEL RELEASE;  Surgeon: Carole Civil, MD;  Location: [facility];  Service: Orthopedics;  Laterality: Right;  . Cesarean section      x2  . Anterior cervical decomp/discectomy fusion N/A 08/06/2014    Procedure: ANTERIOR CERVICAL DECOMPRESSION/DISCECTOMY FUSION CERVICAL THREE-FOUR,CERVICAL SIX-SEVEN ,CERVICAL SEVEN-THORACIC ONE;  Surgeon: Floyce Stakes, MD;  Location: [place];  Service: Neurosurgery;  Laterality: N/A;  . Colonoscopy    . Nasal sinus surgery N/A 04/13/2015    Procedure: nasal endoscopy with adenoid biopsy;  Surgeon: Ruby Cola, MD;  Location: [facility];  Service: ENT;  Laterality: N/A;  . Shoulder arthroscopy with distal clavicle resection Left 05/18/2015    Procedure: LEFT SHOULDER ARTHROSCOPY WITH  DISTAL CLAVICLE RESECTION;  Surgeon: Earlie Server, MD;  Location: [place];  Service: Orthopedics;  Laterality: Left;  . Shoulder acromioplasty Left 05/18/2015    Procedure: SHOULDER ACROMIOPLASTY;  Surgeon: Earlie Server, MD;  Location: [place];  Service: Orthopedics;  Laterality: Left;  . Cholecystectomy  N/A 06/17/2015    Procedure: LAPAROSCOPIC CHOLECYSTECTOMY;  Surgeon: Aviva Signs, MD;  Location: [facility];  Service: General;  Laterality: N/A;   No family history on file. Social History  Substance Use Topics  . Smoking status: Never Smoker   . Smokeless tobacco: Never Used  . Alcohol Use: No   OB History    No data available     Review of Systems  Gastrointestinal: Positive for abdominal pain. Negative for nausea.  Genitourinary: Negative for vaginal bleeding and vaginal discharge.  All other systems reviewed and are  negative.     Allergies  Peanut-containing drug products; Shellfish allergy; and Codeine  Home Medications   Prior to Admission medications   Medication Sig Start Date End Date Taking? Authorizing Provider  acyclovir (ZOVIRAX) 400 MG tablet Take 400 mg by mouth daily.    Yes Historical Provider, MD  amLODipine (NORVASC) 5 MG tablet Take 5 mg by mouth daily with breakfast.    Yes Historical Provider, MD  aspirin EC 81 MG tablet Take 81 mg by mouth daily.   Yes Historical Provider, MD  budesonide-formoterol (SYMBICORT) 160-4.5 MCG/ACT inhaler Inhale 2 puffs into the lungs 2 (two) times daily.   Yes Historical Provider, MD  diazepam (VALIUM) 5 MG tablet Take 5 mg by mouth every 6 (six) hours as needed for anxiety.   Yes Historical Provider, MD  escitalopram (LEXAPRO) 10 MG tablet Take 10 mg by mouth daily.   Yes Historical Provider, MD  estradiol (ESTRACE) 1 MG tablet Take 1 mg by mouth daily with breakfast.    Yes Historical Provider, MD  gabapentin (NEURONTIN) 100 MG capsule TWO CAPSULES BY MOUTH EVERY 8 HOURS Patient taking differently: Take 200 mg by mouth every 8 (eight) hours.  01/04/15  Yes Carole Civil, MD  methocarbamol (ROBAXIN) 500 MG tablet Take 1 tablet (500 mg total) by mouth 2 (two) times daily. 01/03/15  Yes Carole Civil, MD  ondansetron (ZOFRAN ODT) 8 MG disintegrating tablet Take 1 tablet (8 mg total) by mouth every 8 (eight) hours as needed for nausea or vomiting. 06/12/15  Yes Dorie Rank, MD  HYDROcodone-acetaminophen (NORCO/VICODIN) 5-325 MG tablet Take 1 tablet by mouth every 4 (four) hours as needed. Patient not taking: Reported on 07/31/2015 06/17/15   Aviva Signs, MD   BP 128/94 mmHg  Pulse 87  Temp(Src) 97.5 F (36.4 C) (Oral)  Resp 18  Ht 5\' 1"  (1.549 m)  Wt 63.05 kg  BMI 26.28 kg/m2  SpO2 99% Physical Exam  Constitutional: She is oriented to person, place, and time. She appears well-developed and well-nourished.  HENT:  Head: Normocephalic.  Right  Ear: External ear normal.  Left Ear: External ear normal.  Nose: Nose normal.  Mouth/Throat: Oropharynx is clear and moist.  Eyes: Conjunctivae and EOM are normal. Pupils are equal, round, and reactive to light.  Neck: Normal range of motion.  Cardiovascular: Normal rate and normal heart sounds.   Pulmonary/Chest: Effort normal and breath sounds normal.  Abdominal: Soft. She exhibits no distension.  Musculoskeletal: Normal range of motion.  Neurological: She is alert and oriented to person, place, and time.  Skin: Skin is warm.  Psychiatric: She has a normal mood and affect.  Nursing note and vitals reviewed.   ED Course  Procedures (including critical care time) Labs Review Labs Reviewed  COMPREHENSIVE METABOLIC PANEL - Abnormal; Notable for the following:    Total Protein 8.2 (*)    All other components within normal limits  URINALYSIS,  ROUTINE W REFLEX MICROSCOPIC (NOT AT [facility]) - Abnormal; Notable for the following:    Hgb urine dipstick SMALL (*)    All other components within normal limits  URINE MICROSCOPIC-ADD ON - Abnormal; Notable for the following:    Squamous Epithelial / LPF TOO NUMEROUS TO COUNT (*)    Bacteria, UA FEW (*)    All other components within normal limits  LIPASE, BLOOD  CBC    Imaging Review Ct Abdomen Pelvis W Contrast  07/31/2015  CLINICAL DATA:  Low pelvic pain, centered in the vagina, since 0830 today. History COPD. Hypertension. Gastroesophageal reflux disease. EXAM: CT ABDOMEN AND PELVIS WITH CONTRAST TECHNIQUE: Multidetector CT imaging of the abdomen and pelvis was performed using the standard protocol following bolus administration of intravenous contrast. CONTRAST:  192mL OMNIPAQUE IOHEXOL 300 MG/ML  SOLN COMPARISON:  04/19/2015 abdominal ultrasound.  Prior CT 11/02/2011 FINDINGS: Lower chest: Bibasilar atelectasis or scar. Normal heart size without pericardial or pleural effusion. Hepatobiliary: Interval cholecystectomy, without postoperative  fluid collection or biliary duct dilatation. Pancreas: Normal, without mass or ductal dilatation. Spleen: Normal in size, without focal abnormality. Adrenals/Urinary Tract: Normal adrenal glands. Lower pole 5 mm left renal collecting system calculus. Normal right kidney, without hydronephrosis or hydroureter. Normal urinary bladder. Stomach/Bowel: Normal stomach, without wall thickening. Colonic stool burden suggests constipation. Normal terminal ileum and appendix. Normal small bowel. Vascular/Lymphatic: Normal caliber of the aorta and branch vessels. No abdominopelvic adenopathy. Reproductive: Hysterectomy. Cystic lesion within the left adnexal measures 4.5 x 2.7 cm. Similar to decreased since 11/02/2011. Other: No significant free fluid. Musculoskeletal: Right iliac bone island. IMPRESSION: 1.  No acute process in the abdomen or pelvis. 2.  Possible constipation. 3. Cystic lesion within the left adnexa is similar to decreased in size and similar in morphology compared to 11/02/11, most consistent with a benign etiology. 4. Left nephrolithiasis. Electronically Signed   By: Abigail Miyamoto M.D.   On: 07/31/2015 12:40   I have personally reviewed and evaluated these images and lab results as part of my medical decision-making.   EKG Interpretation None      MDM Pt feels better after pain medications.  Ct scan shows ovarain cyst.   Ultrasound obtained to rule out torsion due to intensity of pain.  No torsion.   Radiologist reports benign appearring cyst but advised follow up in 6 months with repeat US.  Pt understands.  I will treat with hydrocodone and ibuprofen  Pt advised to use miralax otc for constipation.   See Dr. Gerarda Fraction for recheck.   Final diagnoses:  Pelvic pain in female  Ovarian cyst, left    An After Visit Summary was printed and given to the patient.  Meds ordered this encounter  Medications  . HYDROmorphone (DILAUDID) injection 1 mg    Sig:   . ondansetron (ZOFRAN) injection 4 mg     Sig:   . diatrizoate meglumine-sodium (GASTROGRAFIN) 66-10 % solution    Sig:     Gaetana Michaelis   : cabinet override  . sodium chloride 0.9 % injection    Sig:     Gaetana Michaelis   : cabinet override  . iohexol (OMNIPAQUE) 300 MG/ML solution 100 mL    Sig:   . HYDROmorphone (DILAUDID) injection 1 mg    Sig:   . HYDROcodone-acetaminophen (NORCO/VICODIN) 5-325 MG tablet    Sig: Take 1 tablet by mouth every 4 (four) hours as needed.    Dispense:  20 tablet    Refill:  0  Order Specific Question:  Supervising Provider    Answer:  Noemi Chapel [[id number]]  . ibuprofen (ADVIL,MOTRIN) 800 MG tablet    Sig: Take 1 tablet (800 mg total) by mouth 3 (three) times daily.    Dispense:  21 tablet    Refill:  0    Order Specific Question:  Supervising Provider    Answer:  Noemi Chapel [place], PA-C 07/31/15 [place], MD 08/01/15 [phone]

## 2015-08-09 ENCOUNTER — Encounter (HOSPITAL_COMMUNITY): Payer: 59

## 2015-08-11 ENCOUNTER — Other Ambulatory Visit (HOSPITAL_COMMUNITY): Payer: Self-pay | Admitting: Internal Medicine

## 2015-08-11 DIAGNOSIS — R928 Other abnormal and inconclusive findings on diagnostic imaging of breast: Secondary | ICD-10-CM

## 2015-08-16 ENCOUNTER — Ambulatory Visit (HOSPITAL_COMMUNITY)
Admission: RE | Admit: 2015-08-16 | Discharge: 2015-08-16 | Disposition: A | Discharge status: Home / Self Care | Payer: BLUE CROSS/BLUE SHIELD | Source: Ambulatory Visit | Attending: Internal Medicine | Admitting: Internal Medicine

## 2015-08-16 DIAGNOSIS — R928 Other abnormal and inconclusive findings on diagnostic imaging of breast: Secondary | ICD-10-CM | POA: Diagnosis present

## 2015-08-31 ENCOUNTER — Encounter: Payer: Self-pay | Admitting: *Deleted

## 2015-09-01 ENCOUNTER — Encounter: Payer: Self-pay | Admitting: Obstetrics & Gynecology

## 2015-09-01 ENCOUNTER — Ambulatory Visit (INDEPENDENT_AMBULATORY_CARE_PROVIDER_SITE_OTHER): Payer: BLUE CROSS/BLUE SHIELD | Admitting: Obstetrics & Gynecology

## 2015-09-01 VITALS — BP 128/88 | HR 72 | Ht 61.0 in | Wt 144.0 lb

## 2015-09-01 DIAGNOSIS — K589 Irritable bowel syndrome without diarrhea: Secondary | ICD-10-CM

## 2015-09-01 DIAGNOSIS — N83202 Unspecified ovarian cyst, left side: Secondary | ICD-10-CM

## 2015-09-01 MED ORDER — PROMETHAZINE HCL 25 MG PO TABS: 25.0000 mg | ORAL_TABLET | Freq: Four times a day (QID) | ORAL | Status: DC | PRN

## 2015-09-01 MED ORDER — DICYCLOMINE HCL 20 MG PO TABS: 20.0000 mg | ORAL_TABLET | Freq: Four times a day (QID) | ORAL | Status: DC

## 2015-09-01 NOTE — Progress Notes (Signed)
Patient ID: Kimberly [name], female   DOB: 11/03/1960, 55 y.o.   MRN: [id number]      Chief Complaint  Patient presents with  . Referral    ovarian cyst and pelvic pain. had ultrasound done.    Blood pressure 128/88, pulse 72, height 5\' 1"  (1.549 m), weight 144 lb (65.318 kg).  55 y.o. No obstetric history on file. No LMP recorded. Patient has had a hysterectomy. The current method of family planning is status post hysterectomy.  Subjective Pt referred for evaluation of intermittent severe midline pelvic pain which wakes her out of her sleep. Feels like a stab through the vagina Went to emergency department where she had a sonogram that revealed a 3 cm simple cyst of the left ovary She describes her pain as sudden excruciating not associated with intercourse diarrhea nausea vomiting diarrhea fever It has only happened 3 times in the past year and eases off once she gets a dose of pain medicine in the ED  Objective   Pertinent ROS No burning with urination, frequency or urgency No nausea, vomiting or diarrhea Nor fever chills or other constitutional symptoms   Labs or studies Labs and imaging studies from the ED were reviewed in full including images themselves    Impression Diagnoses this Encounter::   ICD-9-CM ICD-10-CM   1. Spasm of bowel 564.9 K58.9   2. Cyst of left ovary 620.2 N83.202 US Pelvis Complete     US Transvaginal Non-OB    Established relevant diagnosis(es):   Plan/Recommendations: Meds ordered this encounter  Medications  . promethazine (PHENERGAN) 25 MG tablet    Sig: Take 1 tablet (25 mg total) by mouth every 6 (six) hours as needed for nausea or vomiting.    Dispense:  10 tablet    Refill:  1  . dicyclomine (BENTYL) 20 MG tablet    Sig: Take 1 tablet (20 mg total) by mouth every 6 (six) hours.    Dispense:  20 tablet    Refill:  1    Labs or Scans Ordered: Orders Placed This Encounter  Procedures  . US Pelvis Complete  . US  Transvaginal Non-OB    Management:: Recheck cyst in 6 months If pain returns use phenergan and bentyl to treat as bowel spasm, as I do not think this is related to the relatively small left ovarian cyst  Follow up Return in about 6 months (around 02/29/2016) for GYN sono, Follow up, with Dr Elonda Husky.        Face to face time:  20 minutes  Greater than 50% of the visit time was spent in counseling and coordination of care with the patient.  The summary and outline of the counseling and care coordination is summarized in the note above.   All questions were answered.  Past Medical History  Diagnosis Date  . COPD (chronic obstructive pulmonary disease) ([place])   . Herpes   . Hypertension     pt. doesn't see cardiologist, followed for HTN by Dr. Gerarda Fraction  . Shortness of breath dyspnea   . Anxiety     takes alprazolam - rare use   . GERD (gastroesophageal reflux disease)     pt. reports that its better, no meds in use at this time- 2015  . Arthritis     cervical spondylosis   . History of pneumonia   . Chronic bronchitis with COPD (chronic obstructive pulmonary disease) ([place])   . Brain cyst   . Recurrent falls   .  Balance problem   . Urinary frequency   . History of kidney stones   . Inflammation of shoulder joint   . Depression   . PONV (postoperative nausea and vomiting)   . Difficult intubation     Small mouth opening, limited neck flexion, very anterior     Past Surgical History  Procedure Laterality Date  . Abdominal hysterectomy    . Rotator cuff repair Left   . Foreign body removal Left     knee-as child  . Carpal tunnel release Left 02/16/2013    Procedure: CARPAL TUNNEL RELEASE;  Surgeon: Carole Civil, MD;  Location: [facility];  Service: Orthopedics;  Laterality: Left;  . Carpal tunnel release Right 03/27/2013    Procedure: RIGHT CARPAL TUNNEL RELEASE;  Surgeon: Carole Civil, MD;  Location: [facility];  Service: Orthopedics;  Laterality: Right;  . Cesarean  section      x2  . Anterior cervical decomp/discectomy fusion N/A 08/06/2014    Procedure: ANTERIOR CERVICAL DECOMPRESSION/DISCECTOMY FUSION CERVICAL THREE-FOUR,CERVICAL SIX-SEVEN ,CERVICAL SEVEN-THORACIC ONE;  Surgeon: Floyce Stakes, MD;  Location: [place];  Service: Neurosurgery;  Laterality: N/A;  . Colonoscopy    . Nasal sinus surgery N/A 04/13/2015    Procedure: nasal endoscopy with adenoid biopsy;  Surgeon: Ruby Cola, MD;  Location: [place];  Service: ENT;  Laterality: N/A;  . Shoulder arthroscopy with distal clavicle resection Left 05/18/2015    Procedure: LEFT SHOULDER ARTHROSCOPY WITH  DISTAL CLAVICLE RESECTION;  Surgeon: Earlie Server, MD;  Location: [place] [facility];  Service: Orthopedics;  Laterality: Left;  . Shoulder acromioplasty Left 05/18/2015    Procedure: SHOULDER ACROMIOPLASTY;  Surgeon: Earlie Server, MD;  Location: [place];  Service: Orthopedics;  Laterality: Left;  . Cholecystectomy N/A 06/17/2015    Procedure: LAPAROSCOPIC CHOLECYSTECTOMY;  Surgeon: Aviva Signs, MD;  Location: [facility];  Service: General;  Laterality: N/A;    OB History    No data available      Allergies  Allergen Reactions  . Peanut-Containing Drug Products Anaphylaxis and Hives  . Shellfish Allergy Anaphylaxis and Hives  . Codeine Other (See Comments)    jitters    Social History   Social History  . Marital Status: Married    Spouse Name: N/A  . Number of Children: N/A  . Years of Education: N/A   Social History Main Topics  . Smoking status: Never Smoker   . Smokeless tobacco: Never Used  . Alcohol Use: No  . Drug Use: No  . Sexual Activity: Yes    Birth Control/ Protection: Surgical   Other Topics Concern  . None   Social History Narrative    Family History  Problem Relation Age of Onset  . Hypertension Father   . Hypertension Other

## 2015-11-18 IMAGING — CR DG CHEST 2V
2 series · 2 of 2 positions shown · non-contrast
Comparison: December 19, 2011

CLINICAL DATA: Severe sharp left-sided chest pain 5 hr

EXAM:
CHEST  2 VIEW

[view not recorded (1 of 2)]
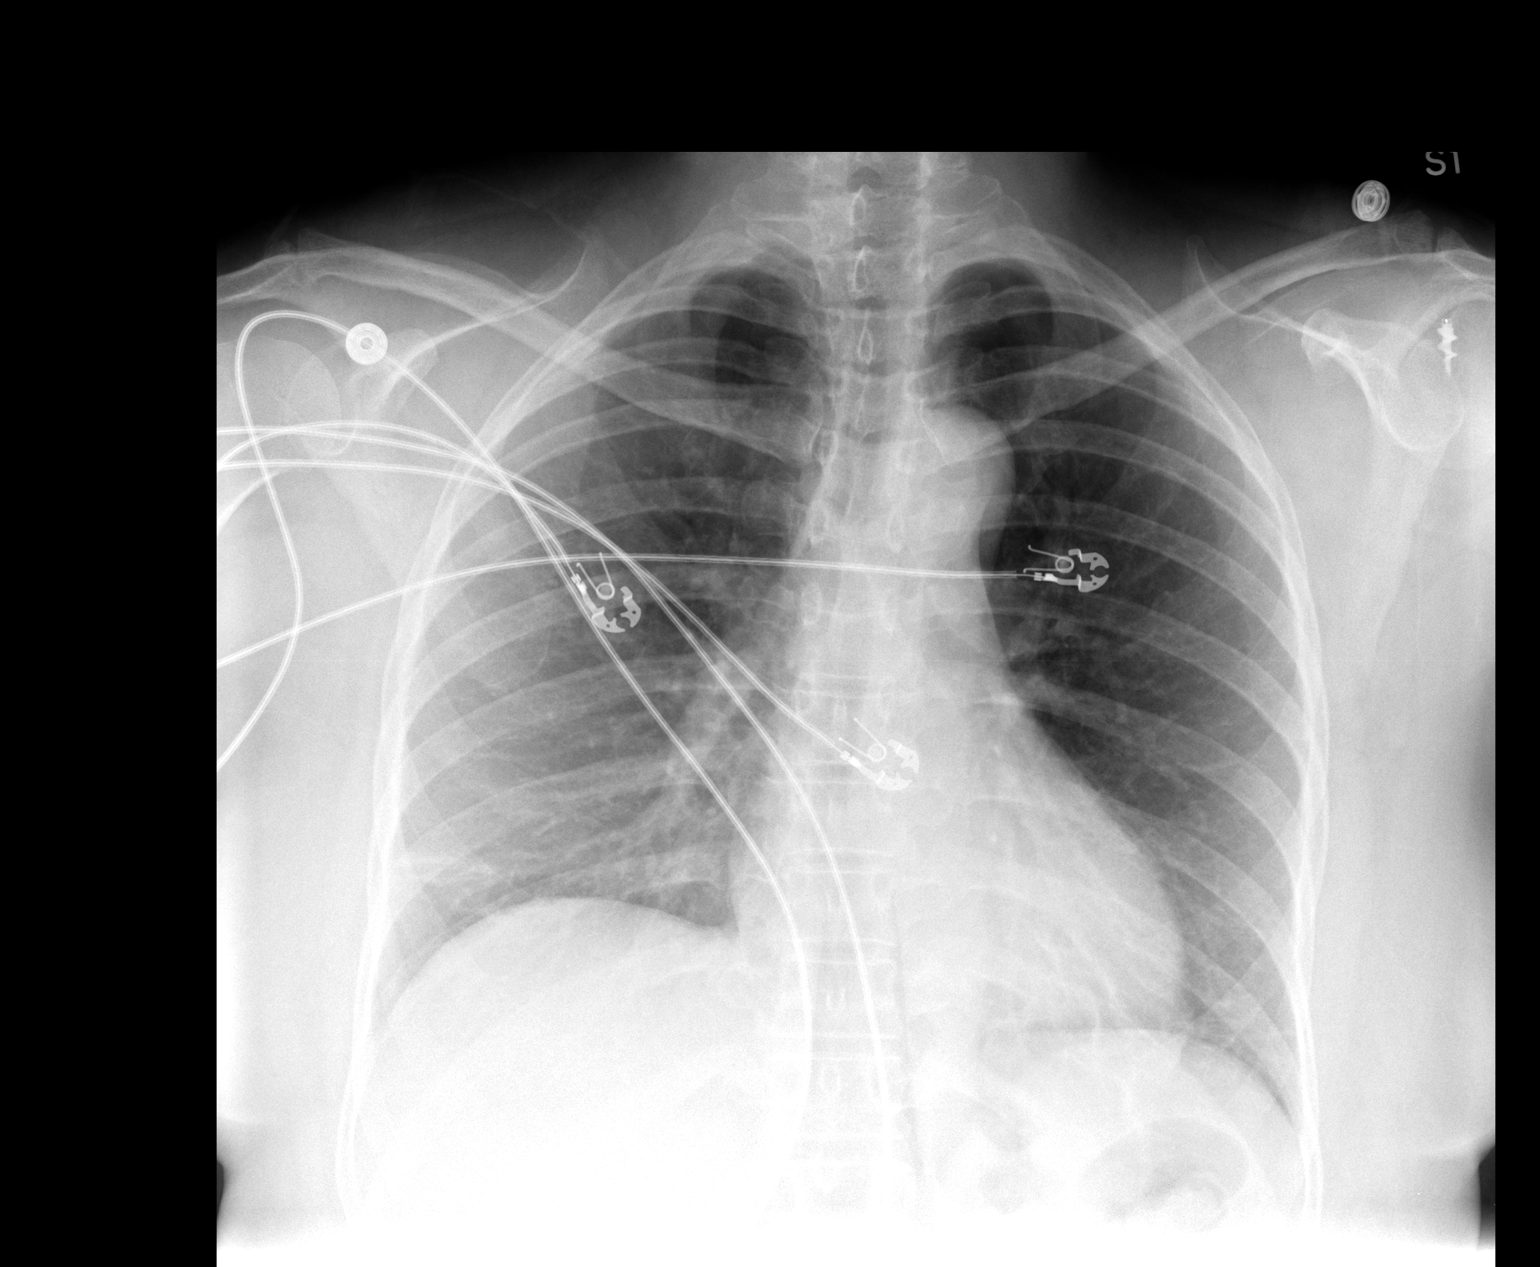

[view not recorded (2 of 2)]
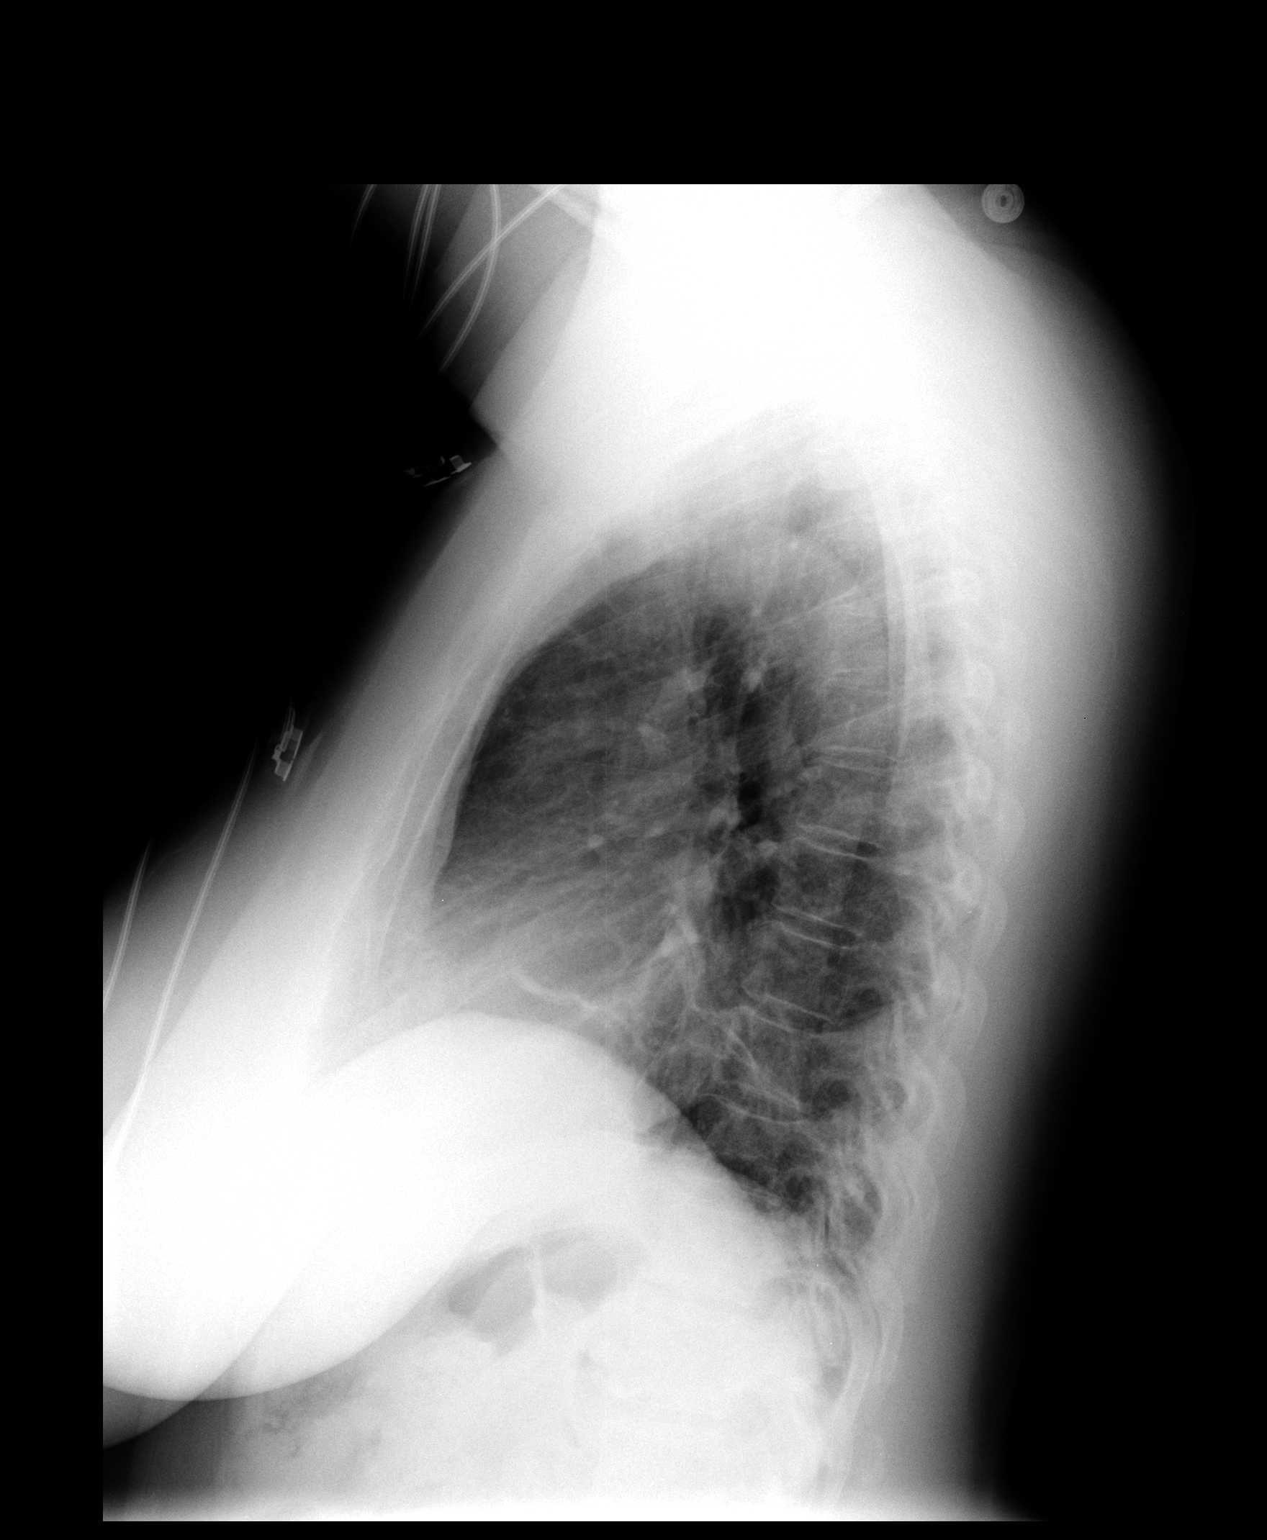

[2 of 2 positions shown; findings below may reference images not displayed]

FINDINGS: Heart size and vascular pattern are normal. Mild subsegmental
atelectasis laterally in the right lower lobe. Left lung clear
except for minimal scarring or atelectasis at the lateral base. This
is stable. No consolidation, infiltrate, or effusion. Bony thorax
appears intact.
IMPRESSION: No significant acute abnormality. Mild discoid atelectasis right
lower lobe and mild stable left lower lobe scarring.

## 2016-01-09 ENCOUNTER — Other Ambulatory Visit: Payer: Self-pay | Admitting: *Deleted

## 2016-01-09 MED ORDER — METHOCARBAMOL 500 MG PO TABS: 500.0000 mg | ORAL_TABLET | Freq: Two times a day (BID) | ORAL | Status: DC

## 2016-02-06 DIAGNOSIS — M79641 Pain in right hand: Secondary | ICD-10-CM | POA: Insufficient documentation

## 2016-02-06 DIAGNOSIS — M79642 Pain in left hand: Secondary | ICD-10-CM

## 2016-02-07 DIAGNOSIS — E559 Vitamin D deficiency, unspecified: Secondary | ICD-10-CM | POA: Insufficient documentation

## 2016-03-01 ENCOUNTER — Ambulatory Visit: Payer: BLUE CROSS/BLUE SHIELD | Admitting: Obstetrics & Gynecology

## 2016-03-01 ENCOUNTER — Other Ambulatory Visit: Payer: BLUE CROSS/BLUE SHIELD

## 2016-03-07 DIAGNOSIS — R1310 Dysphagia, unspecified: Secondary | ICD-10-CM | POA: Insufficient documentation

## 2016-03-12 ENCOUNTER — Other Ambulatory Visit: Payer: Self-pay | Admitting: Internal Medicine

## 2016-03-12 DIAGNOSIS — R921 Mammographic calcification found on diagnostic imaging of breast: Secondary | ICD-10-CM

## 2016-03-13 ENCOUNTER — Other Ambulatory Visit (HOSPITAL_COMMUNITY): Payer: Self-pay | Admitting: Internal Medicine

## 2016-03-13 ENCOUNTER — Encounter (HOSPITAL_COMMUNITY): Payer: Self-pay

## 2016-03-13 ENCOUNTER — Ambulatory Visit (HOSPITAL_COMMUNITY)
Admission: RE | Admit: 2016-03-13 | Discharge: 2016-03-13 | Disposition: A | Discharge status: Home / Self Care | Payer: BLUE CROSS/BLUE SHIELD | Source: Ambulatory Visit | Attending: Internal Medicine | Admitting: Internal Medicine

## 2016-03-13 DIAGNOSIS — R921 Mammographic calcification found on diagnostic imaging of breast: Secondary | ICD-10-CM

## 2016-03-13 DIAGNOSIS — C50912 Malignant neoplasm of unspecified site of left female breast: Secondary | ICD-10-CM

## 2016-03-22 ENCOUNTER — Other Ambulatory Visit: Payer: Self-pay | Admitting: Neurosurgery

## 2016-03-22 DIAGNOSIS — M5412 Radiculopathy, cervical region: Secondary | ICD-10-CM

## 2016-03-30 ENCOUNTER — Encounter (HOSPITAL_COMMUNITY): Payer: Self-pay

## 2016-03-30 ENCOUNTER — Emergency Department (HOSPITAL_COMMUNITY)
Admission: EM | Admit: 2016-03-30 | Discharge: 2016-03-30 | Disposition: A | Discharge status: Home / Self Care | Payer: BLUE CROSS/BLUE SHIELD | Attending: Emergency Medicine | Admitting: Emergency Medicine

## 2016-03-30 DIAGNOSIS — I10 Essential (primary) hypertension: Secondary | ICD-10-CM | POA: Insufficient documentation

## 2016-03-30 DIAGNOSIS — J449 Chronic obstructive pulmonary disease, unspecified: Secondary | ICD-10-CM | POA: Diagnosis not present

## 2016-03-30 DIAGNOSIS — M542 Cervicalgia: Secondary | ICD-10-CM | POA: Diagnosis present

## 2016-03-30 DIAGNOSIS — Z7982 Long term (current) use of aspirin: Secondary | ICD-10-CM | POA: Diagnosis not present

## 2016-03-30 DIAGNOSIS — Z91010 Allergy to peanuts: Secondary | ICD-10-CM | POA: Diagnosis not present

## 2016-03-30 DIAGNOSIS — R221 Localized swelling, mass and lump, neck: Secondary | ICD-10-CM | POA: Diagnosis not present

## 2016-03-30 LAB — CBC WITH DIFFERENTIAL/PLATELET
BASOS PCT: 1 %
Basophils Absolute: 0.1 10*3/uL (ref 0.0–0.1)
EOS ABS: 0.2 10*3/uL (ref 0.0–0.7)
Eosinophils Relative: 2 %
HEMATOCRIT: 40.7 % (ref 36.0–46.0)
Hemoglobin: 13.1 g/dL (ref 12.0–15.0)
Lymphocytes Relative: 38 %
Lymphs Abs: 2.9 10*3/uL (ref 0.7–4.0)
MCH: 30.2 pg (ref 26.0–34.0)
MCHC: 32.2 g/dL (ref 30.0–36.0)
MCV: 93.8 fL (ref 78.0–100.0)
MONO ABS: 0.7 10*3/uL (ref 0.1–1.0)
MONOS PCT: 9 %
Neutro Abs: 3.8 10*3/uL (ref 1.7–7.7)
Neutrophils Relative %: 50 %
Platelets: 251 10*3/uL (ref 150–400)
RBC: 4.34 MIL/uL (ref 3.87–5.11)
RDW: 12.1 % (ref 11.5–15.5)
WBC: 7.6 10*3/uL (ref 4.0–10.5)

## 2016-03-30 LAB — BASIC METABOLIC PANEL
Anion gap: 9 (ref 5–15)
CALCIUM: 9.2 mg/dL (ref 8.9–10.3)
CO2: 24 mmol/L (ref 22–32)
CREATININE: 0.69 mg/dL (ref 0.44–1.00)
Chloride: 105 mmol/L (ref 101–111)
GFR calc non Af Amer: >60 mL/min (ref >60)
Glucose, Bld: 93 mg/dL (ref 65–99)
Potassium: 4.1 mmol/L (ref 3.5–5.1)
SODIUM: 138 mmol/L (ref 135–145)

## 2016-03-30 MED ORDER — SULFAMETHOXAZOLE-TRIMETHOPRIM 800-160 MG PO TABS: 1.0000 | ORAL_TABLET | Freq: Two times a day (BID) | ORAL | 0 refills | Status: AC

## 2016-03-30 NOTE — ED Notes (Signed)
Declined W/C at D/C and was escorted to lobby by RN. 

## 2016-03-30 NOTE — ED Provider Notes (Signed)
[facility] DEPT Provider Note   CSN: [id number] Arrival date & time: 03/30/16  1252     History   Chief Complaint Chief Complaint  Patient presents with  . Neck Pain    HPI Kimberly [name] is a 55 y.o. female.  Patient with h/o cervical spine surgery -- presents with c/o neck pain starting this morning after awaking from sleep. She has a small area of swelling and redness on the right anterior neck where she complains of pain. No injury or insect bite. No fever. She can swallow but states she has a pressure sensation when she swallows. She is able to move her neck without significant pain. No numbness, weakness, or tingling in her upper or lower extremities. No history of immunocompromise or prednisone use. No difficulties breathing. No cough or hemoptysis. The onset of this condition was acute. The course is constant. Aggravating factors: none. Alleviating factors: none.        Past Medical History:  Diagnosis Date  . Anxiety    takes alprazolam - rare use   . Arthritis    cervical spondylosis   . Balance problem   . Brain cyst   . Chronic bronchitis with COPD (chronic obstructive pulmonary disease) ([place])   . COPD (chronic obstructive pulmonary disease) ([place])   . Depression   . Difficult intubation    Small mouth opening, limited neck flexion, very anterior   . GERD (gastroesophageal reflux disease)    pt. reports that its better, no meds in use at this time- 2015  . Herpes   . History of kidney stones   . History of pneumonia   . Hypertension    pt. doesn't see cardiologist, followed for HTN by Dr. Gerarda Fraction  . Inflammation of shoulder joint   . PONV (postoperative nausea and vomiting)   . Recurrent falls   . Shortness of breath dyspnea   . Urinary frequency     Patient Active Problem List   Diagnosis Date Noted  . Cervical stenosis of spinal canal 08/06/2014  . Muscle weakness (generalized) 11/12/2013  . Tight fascia 11/12/2013  . Decreased range of motion  of shoulder 11/12/2013  . Cervical spondylosis without myelopathy 10/29/2013  . S/P carpal tunnel release 02/19/2013  . CTS (carpal tunnel syndrome) 02/19/2013  . SHOULDER PAIN 10/06/2007  . IMPINGEMENT SYNDROME 10/06/2007  . RUPTURE ROTATOR CUFF 10/06/2007  . HIGH BLOOD PRESSURE 10/03/2007    Past Surgical History:  Procedure Laterality Date  . ABDOMINAL HYSTERECTOMY    . ANTERIOR CERVICAL DECOMP/DISCECTOMY FUSION N/A 08/06/2014   Procedure: ANTERIOR CERVICAL DECOMPRESSION/DISCECTOMY FUSION CERVICAL THREE-FOUR,CERVICAL SIX-SEVEN ,CERVICAL SEVEN-THORACIC ONE;  Surgeon: Floyce Stakes, MD;  Location: [facility];  Service: Neurosurgery;  Laterality: N/A;  . CARPAL TUNNEL RELEASE Left 02/16/2013   Procedure: CARPAL TUNNEL RELEASE;  Surgeon: Carole Civil, MD;  Location: [facility];  Service: Orthopedics;  Laterality: Left;  . CARPAL TUNNEL RELEASE Right 03/27/2013   Procedure: RIGHT CARPAL TUNNEL RELEASE;  Surgeon: Carole Civil, MD;  Location: [facility];  Service: Orthopedics;  Laterality: Right;  . CESAREAN SECTION     x2  . CHOLECYSTECTOMY N/A 06/17/2015   Procedure: LAPAROSCOPIC CHOLECYSTECTOMY;  Surgeon: Aviva Signs, MD;  Location: [facility];  Service: General;  Laterality: N/A;  . COLONOSCOPY    . FOREIGN BODY REMOVAL Left    knee-as child  . NASAL SINUS SURGERY N/A 04/13/2015   Procedure: nasal endoscopy with adenoid biopsy;  Surgeon: Ruby Cola, MD;  Location: [facility];  Service: ENT;  Laterality: N/A;  . ROTATOR CUFF REPAIR Left   . SHOULDER ACROMIOPLASTY Left 05/18/2015   Procedure: SHOULDER ACROMIOPLASTY;  Surgeon: Earlie Server, MD;  Location: [facility];  Service: Orthopedics;  Laterality: Left;  . SHOULDER ARTHROSCOPY WITH DISTAL CLAVICLE RESECTION Left 05/18/2015   Procedure: LEFT SHOULDER ARTHROSCOPY WITH  DISTAL CLAVICLE RESECTION;  Surgeon: Earlie Server, MD;  Location: [facility];  Service: Orthopedics;  Laterality: Left;    OB History     No data available       Home Medications    Prior to Admission medications   Medication Sig Start Date End Date Taking? Authorizing Provider  acyclovir (ZOVIRAX) 400 MG tablet Take 400 mg by mouth daily.     Historical Provider, MD  amLODipine (NORVASC) 5 MG tablet Take 5 mg by mouth daily with breakfast.     Historical Provider, MD  aspirin EC 81 MG tablet Take 81 mg by mouth daily.    Historical Provider, MD  budesonide-formoterol (SYMBICORT) 160-4.5 MCG/ACT inhaler Inhale 2 puffs into the lungs 2 (two) times daily.    Historical Provider, MD  diazepam (VALIUM) 5 MG tablet Take 5 mg by mouth every 6 (six) hours as needed for anxiety.    Historical Provider, MD  dicyclomine (BENTYL) 20 MG tablet Take 1 tablet (20 mg total) by mouth every 6 (six) hours. 09/01/15   Florian Buff, MD  escitalopram (LEXAPRO) 10 MG tablet Take 10 mg by mouth daily.    Historical Provider, MD  estradiol (ESTRACE) 1 MG tablet Take 1 mg by mouth daily with breakfast.     Historical Provider, MD  gabapentin (NEURONTIN) 100 MG capsule TWO CAPSULES BY MOUTH EVERY 8 HOURS Patient taking differently: Take 200 mg by mouth every 8 (eight) hours.  01/04/15   Carole Civil, MD  HYDROcodone-acetaminophen (NORCO/VICODIN) 5-325 MG tablet Take 1 tablet by mouth every 4 (four) hours as needed. Patient not taking: Reported on 03/26/2016 07/31/15   Fransico Meadow, PA-C  ibuprofen (ADVIL,MOTRIN) 800 MG tablet Take 1 tablet (800 mg total) by mouth 3 (three) times daily. 07/31/15   Fransico Meadow, PA-C  methocarbamol (ROBAXIN) 500 MG tablet Take 1 tablet (500 mg total) by mouth 2 (two) times daily. 01/09/16   Carole Civil, MD  ondansetron (ZOFRAN ODT) 8 MG disintegrating tablet Take 1 tablet (8 mg total) by mouth every 8 (eight) hours as needed for nausea or vomiting. 06/12/15   Dorie Rank, MD  oxybutynin (DITROPAN-XL) 5 MG 24 hr tablet Take 5 mg by mouth at bedtime.    Historical Provider, MD  oxyCODONE-acetaminophen  (PERCOCET/ROXICET) 5-325 MG tablet Take by mouth every 4 (four) hours as needed for severe pain.    Historical Provider, MD  pantoprazole (PROTONIX) 20 MG tablet Take 20 mg by mouth daily.    Historical Provider, MD    Family History Family History  Problem Relation Age of Onset  . Hypertension Father   . Hypertension Other     Social History Social History  Substance Use Topics  . Smoking status: Never Smoker  . Smokeless tobacco: Never Used  . Alcohol use No     Allergies   Peanut-containing drug products; Shellfish allergy; and Codeine   Review of Systems Review of Systems  Constitutional: Negative for fever.  HENT: Negative for rhinorrhea, sore throat and trouble swallowing.   Eyes: Negative for redness.  Respiratory: Negative for cough.   Cardiovascular: Negative for chest pain.  Gastrointestinal: Negative for abdominal pain, diarrhea, nausea and vomiting.  Genitourinary: Negative for dysuria.  Musculoskeletal: Positive for neck pain. Negative for myalgias.  Skin: Positive for color change. Negative for rash.  Neurological: Negative for headaches.     Physical Exam Updated Vital Signs BP 141/95   Pulse 99   Temp 98.7 F (37.1 C) (Oral)   Resp 18   Ht 5\' 1"  (1.549 m)   Wt 65.8 kg   SpO2 100%   BMI 27.40 kg/m   Physical Exam  Constitutional: She appears well-developed and well-nourished.  HENT:  Head: Normocephalic and atraumatic.  Right Ear: External ear normal.  Left Ear: External ear normal.  Nose: Nose normal.  Mouth/Throat: Oropharynx is clear and moist. No oropharyngeal exudate.  There is a small 1 cm area of induration without palpable abscess or fluctuance to the right anterior neck. There is a punctate dark area centrally. No drainage. No surrounding crepitus. No trismus.  Eyes: Conjunctivae are normal. Right eye exhibits no discharge. Left eye exhibits no discharge.  Neck: Normal range of motion. Neck supple.  Cardiovascular: Normal rate,  regular rhythm and normal heart sounds.   Pulmonary/Chest: Effort normal and breath sounds normal.  Abdominal: Soft. There is no tenderness.  Neurological: She is alert.  Skin: Skin is warm and dry.  Psychiatric: She has a normal mood and affect.  Nursing note and vitals reviewed.    ED Treatments / Results  Labs (all labs ordered are listed, but only abnormal results are displayed) Labs Reviewed  BASIC METABOLIC PANEL - Abnormal; Notable for the following:       Result Value   BUN <5 (*)    All other components within normal limits  CBC WITH DIFFERENTIAL/PLATELET    EKG  EKG Interpretation None       Procedures Procedures (including critical care time)  Medications Ordered in ED Medications - No data to display   Initial Impression / Assessment and Plan / ED Course  I have reviewed the triage vital signs and the nursing notes.  Pertinent labs & imaging results that were available during my care of the patient were reviewed by me and considered in my medical decision making (see chart for details).  Clinical Course   Patient seen and examined. Work-up reviewed with patient. Discussed and seen with Dr. Kathrynn Humble. Bedside US performed. Will cover for developing abscess with Bactrim.   Vital signs reviewed and are as follows: BP 141/95   Pulse 99   Temp 98.7 F (37.1 C) (Oral)   Resp 18   Ht 5\' 1"  (1.549 m)   Wt 65.8 kg   SpO2 100%   BMI 27.40 kg/m   EMERGENCY DEPARTMENT US SOFT TISSUE INTERPRETATION "Study: Limited Ultrasound of the noted body part in comments below"  INDICATIONS: Pain and Soft tissue infection Multiple views of the body part are obtained with a multi-frequency linear probe  PERFORMED BY:  Myself, Dr. Kathrynn Humble  IMAGES ARCHIVED?: Yes  SIDE:Right   BODY PART:Neck  FINDINGS: No abcess noted and Cellulitis absent  LIMITATIONS: none  INTERPRETATION:  No abcess noted and No cellulitis noted  COMMENT:  ? Inflamed LN or cyst, no fluid  collection  The patient was urged to return to the Emergency Department urgently with worsening pain, swelling, expanding erythema especially if it streaks away from the affected area, fever, or if they have any other concerns.   The patient was urged to return to the Emergency Department or go to their PCP  in 48 hours for wound recheck if the area is not significantly improved.  The patient verbalized understanding and stated agreement with this plan.   Final Clinical Impressions(s) / ED Diagnoses   Final diagnoses:  Nodule of neck   Patient with external nodule and focal area of swelling and redness. No fluid collection or fluctuance at the current time. No indication for drainage. Do not suspect retropharyngeal abscess, epiglottitis, other deep space infection in neck requiring emergent CT imaging at this time. Will treat with antibiotics and have patient monitor closely.  New Prescriptions Discharge Medication List as of 03/30/2016  2:59 PM    START taking these medications   Details  sulfamethoxazole-trimethoprim (BACTRIM DS,SEPTRA DS) 800-160 MG tablet Take 1 tablet by mouth 2 (two) times daily., Starting Fri 03/30/2016, Until Fri 04/06/2016, Print         Carlisle Cater, PA-C 03/30/16 [place], MD 03/31/16 [phone]

## 2016-03-30 NOTE — Discharge Instructions (Signed)
Please read and follow all provided instructions.  Your diagnoses today include:  1. Nodule of neck     Tests performed today include:  Vital signs. See below for your results today.   Medications prescribed:   Bactrim (trimethoprim/sulfamethoxazole) - antibiotic  You have been prescribed an antibiotic medicine: take the entire course of medicine even if you are feeling better. Stopping early can cause the antibiotic not to work.  Take any prescribed medications only as directed.   Home care instructions:   Follow any educational materials contained in this packet  Follow-up instructions: Return to the Emergency Department or see your doctor in 48 hours for a recheck if your symptoms are not significantly improved.  Return instructions:  Return to the Emergency Department if you have:  Fever  Worsening symptoms  Worsening pain  Worsening swelling  Redness of the skin that moves away from the affected area, especially if it streaks away from the affected area   Any other emergent concerns   Your vital signs today were: BP 148/96    Pulse 74    Temp 98.7 F (37.1 C) (Oral)    Resp 18    Ht 5\' 1"  (1.549 m)    Wt 65.8 kg    SpO2 95%    BMI 27.40 kg/m  If your blood pressure (BP) was elevated above 135/85 this visit, please have this repeated by your doctor within one month. --------------

## 2016-03-30 NOTE — ED Triage Notes (Signed)
Pt presents with 2 day h/o front neck pain.  Pt reports surgery 07/2014, denies any injury;  Pt reports difficulty swallowing, reports awakening this morning with swelling and redness to throat.

## 2016-04-04 ENCOUNTER — Other Ambulatory Visit: Payer: BLUE CROSS/BLUE SHIELD

## 2016-04-13 ENCOUNTER — Other Ambulatory Visit: Payer: Self-pay | Admitting: Internal Medicine

## 2016-04-13 DIAGNOSIS — R221 Localized swelling, mass and lump, neck: Secondary | ICD-10-CM

## 2016-04-19 DIAGNOSIS — G4440 Drug-induced headache, not elsewhere classified, not intractable: Secondary | ICD-10-CM | POA: Insufficient documentation

## 2016-04-19 DIAGNOSIS — R200 Anesthesia of skin: Secondary | ICD-10-CM | POA: Insufficient documentation

## 2016-04-19 DIAGNOSIS — G44229 Chronic tension-type headache, not intractable: Secondary | ICD-10-CM | POA: Insufficient documentation

## 2016-04-19 DIAGNOSIS — R202 Paresthesia of skin: Secondary | ICD-10-CM

## 2016-04-20 ENCOUNTER — Ambulatory Visit
Admission: RE | Admit: 2016-04-20 | Discharge: 2016-04-20 | Disposition: A | Discharge status: Home / Self Care | Payer: BLUE CROSS/BLUE SHIELD | Source: Ambulatory Visit | Attending: Internal Medicine | Admitting: Internal Medicine

## 2016-04-20 DIAGNOSIS — R221 Localized swelling, mass and lump, neck: Secondary | ICD-10-CM

## 2016-04-20 MED ORDER — IOPAMIDOL (ISOVUE-300) INJECTION 61%
75.0000 mL | Freq: Once | INTRAVENOUS | Status: AC | PRN
  Administered 2016-04-20: 75 mL via INTRAVENOUS

## 2016-04-23 ENCOUNTER — Other Ambulatory Visit: Payer: Self-pay | Admitting: Neurology

## 2016-04-23 DIAGNOSIS — R202 Paresthesia of skin: Secondary | ICD-10-CM

## 2016-04-23 DIAGNOSIS — R200 Anesthesia of skin: Secondary | ICD-10-CM

## 2016-04-23 DIAGNOSIS — M542 Cervicalgia: Secondary | ICD-10-CM

## 2016-05-07 ENCOUNTER — Ambulatory Visit
Admission: RE | Admit: 2016-05-07 | Discharge: 2016-05-07 | Disposition: A | Discharge status: Home / Self Care | Payer: BLUE CROSS/BLUE SHIELD | Source: Ambulatory Visit | Attending: Neurology | Admitting: Neurology

## 2016-05-07 DIAGNOSIS — Z981 Arthrodesis status: Secondary | ICD-10-CM | POA: Diagnosis not present

## 2016-05-07 DIAGNOSIS — R202 Paresthesia of skin: Secondary | ICD-10-CM | POA: Diagnosis present

## 2016-05-07 DIAGNOSIS — M542 Cervicalgia: Secondary | ICD-10-CM

## 2016-05-07 DIAGNOSIS — R200 Anesthesia of skin: Secondary | ICD-10-CM | POA: Diagnosis present

## 2016-05-07 DIAGNOSIS — M47812 Spondylosis without myelopathy or radiculopathy, cervical region: Secondary | ICD-10-CM | POA: Insufficient documentation

## 2016-05-11 ENCOUNTER — Ambulatory Visit
Admission: RE | Admit: 2016-05-11 | Discharge: 2016-05-11 | Disposition: A | Discharge status: Home / Self Care | Payer: BLUE CROSS/BLUE SHIELD | Source: Ambulatory Visit | Attending: Neurosurgery | Admitting: Neurosurgery

## 2016-05-11 VITALS — BP 121/83 | HR 74

## 2016-05-11 DIAGNOSIS — M47812 Spondylosis without myelopathy or radiculopathy, cervical region: Secondary | ICD-10-CM

## 2016-05-11 DIAGNOSIS — M5412 Radiculopathy, cervical region: Secondary | ICD-10-CM

## 2016-05-11 DIAGNOSIS — M4802 Spinal stenosis, cervical region: Secondary | ICD-10-CM

## 2016-05-11 MED ORDER — IOPAMIDOL (ISOVUE-M 300) INJECTION 61%
10.0000 mL | Freq: Once | INTRAMUSCULAR | Status: AC | PRN
  Administered 2016-05-11: 10 mL via INTRATHECAL

## 2016-05-11 MED ORDER — DIAZEPAM 5 MG PO TABS
10.0000 mg | ORAL_TABLET | Freq: Once | ORAL | Status: AC
  Administered 2016-05-11: 10 mg via ORAL

## 2016-05-11 NOTE — Progress Notes (Signed)
Patient states she has been off Lexapro and Phenergan for at least the past two days.  jkl

## 2016-05-11 NOTE — Discharge Instructions (Addendum)
Myelogram Discharge Instructions  1. Go home and rest quietly for the next 24 hours.  It is important to lie flat for the next 24 hours.  Get up only to go to the restroom.  You may lie in the bed or on a couch on your back, your stomach, your left side or your right side.  You may have one pillow under your head.  You may have pillows between your knees while you are on your side or under your knees while you are on your back.  2. DO NOT drive today.  Recline the seat as far back as it will go, while still wearing your seat belt, on the way home.  3. You may get up to go to the bathroom as needed.  You may sit up for 10 minutes to eat.  You may resume your normal diet and medications unless otherwise indicated.  Drink plenty of extra fluids today and tomorrow.  4. The incidence of a spinal headache with nausea and/or vomiting is about 5% (one in 20 patients).  If you develop a headache, lie flat and drink plenty of fluids until the headache goes away.  Caffeinated beverages may be helpful.  If you develop severe nausea and vomiting or a headache that does not go away with flat bed rest, call [phone].  5. You may resume normal activities after your 24 hours of bed rest is over; however, do not exert yourself strongly or do any heavy lifting tomorrow.  6. Call your physician for a follow-up appointment.   You may resume Lexapro and Phenergan on Saturday, May 12, 2016 after 1:00p.m.

## 2016-05-12 IMAGING — MR MR CERVICAL SPINE W/O CM
4 of 6 series · 13 of 48 positions shown · non-contrast
Comparison: 05/20/2005.  10/30/2003.

CLINICAL DATA: Posterior neck pain radiating into the right
shoulder and entire right arm. Weakness of the right arm and hand.
Radiation of pain into the left shoulder for the last 7-8 months.

EXAM:
MRI CERVICAL SPINE WITHOUT CONTRAST
TECHNIQUE: Multiplanar, multisequence MR imaging of the cervical spine was
performed. No intravenous contrast was administered.

[Series 3: T2 · sagittal · 3.0mm · 0.38mm/px · 4 of 13 slices shown (1 of 2)]
[im 1/13]
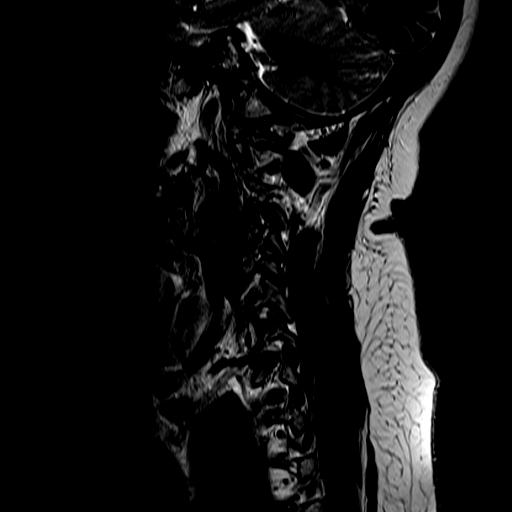
[im 5/13]
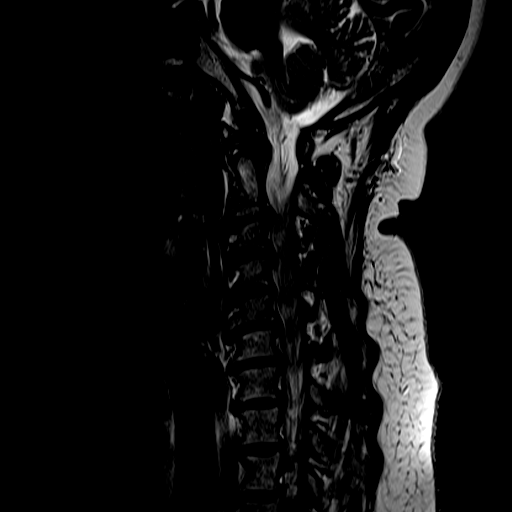
[im 9/13]
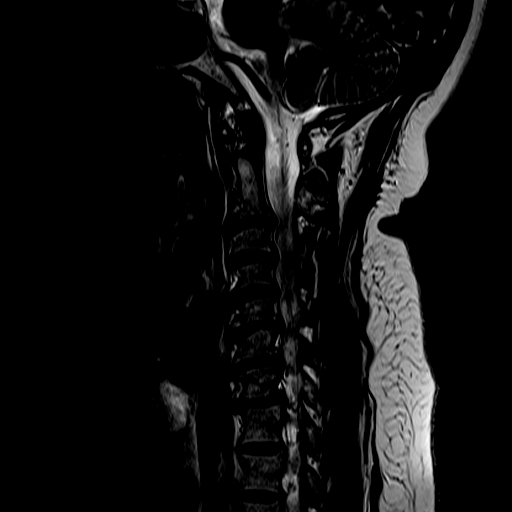
[im 13/13]
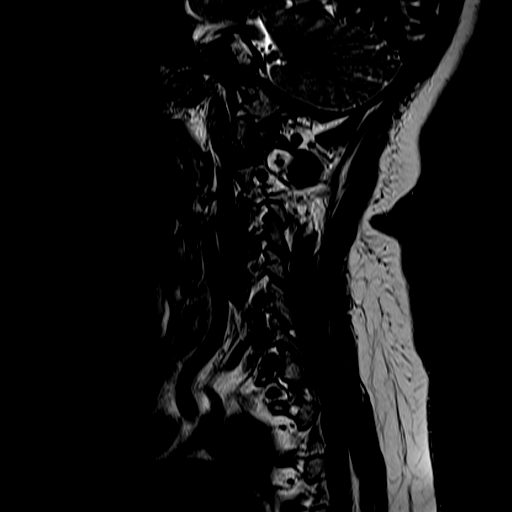

[Series 4: FLAIR · sagittal · 3.0mm · 0.41mm/px · 3 of 13 slices shown]
[im 1/13]
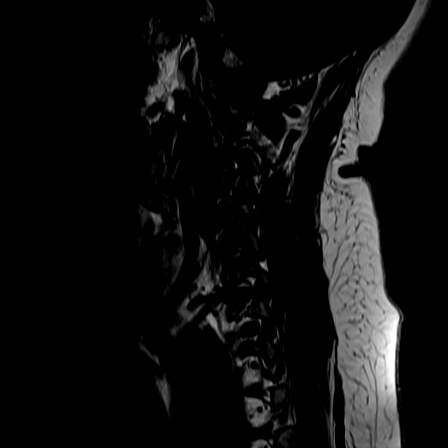
[im 7/13]
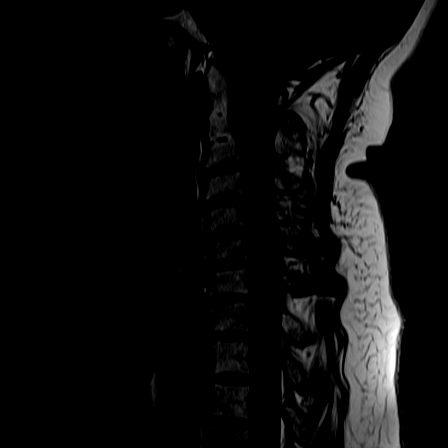
[im 13/13]
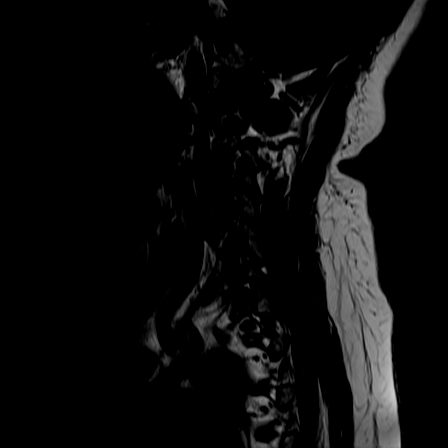

[Series 5: ir sagital · sagittal · 3.0mm · 0.22mm/px · 3 of 13 slices shown]
[im 1/13]
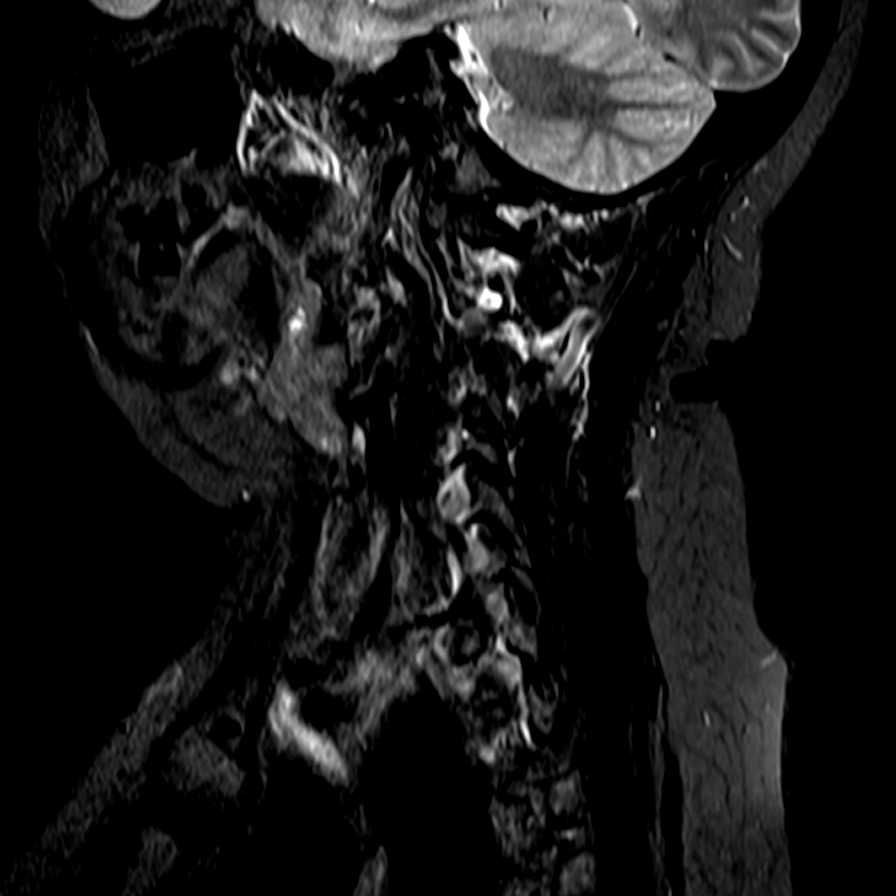
[im 7/13]
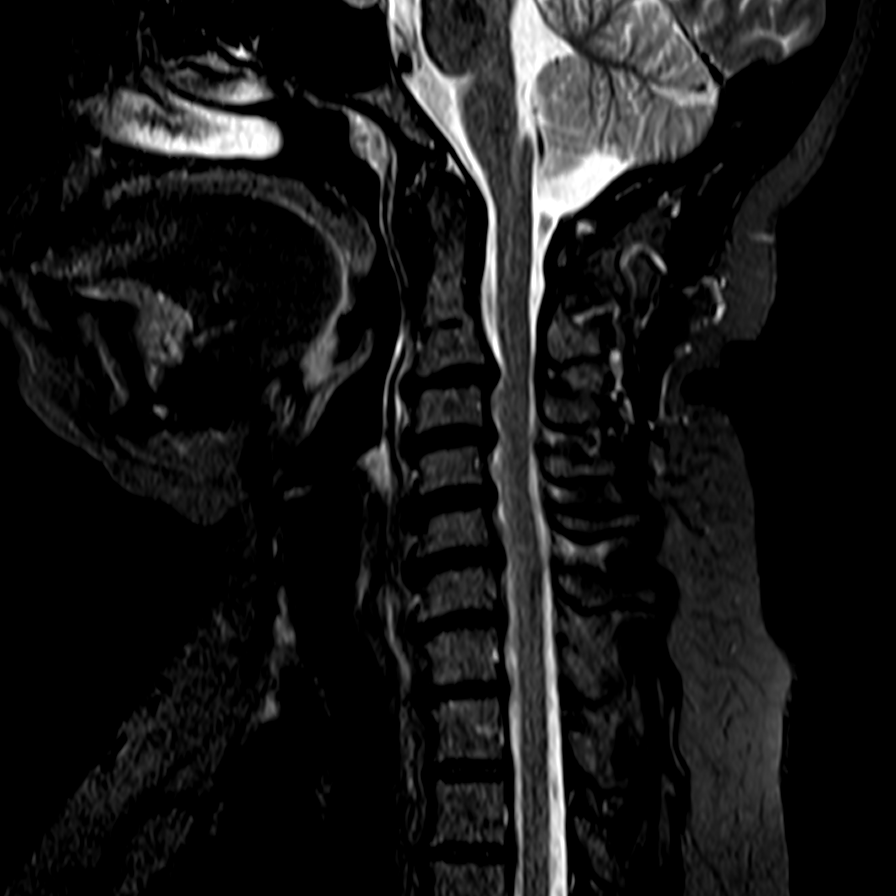
[im 13/13]
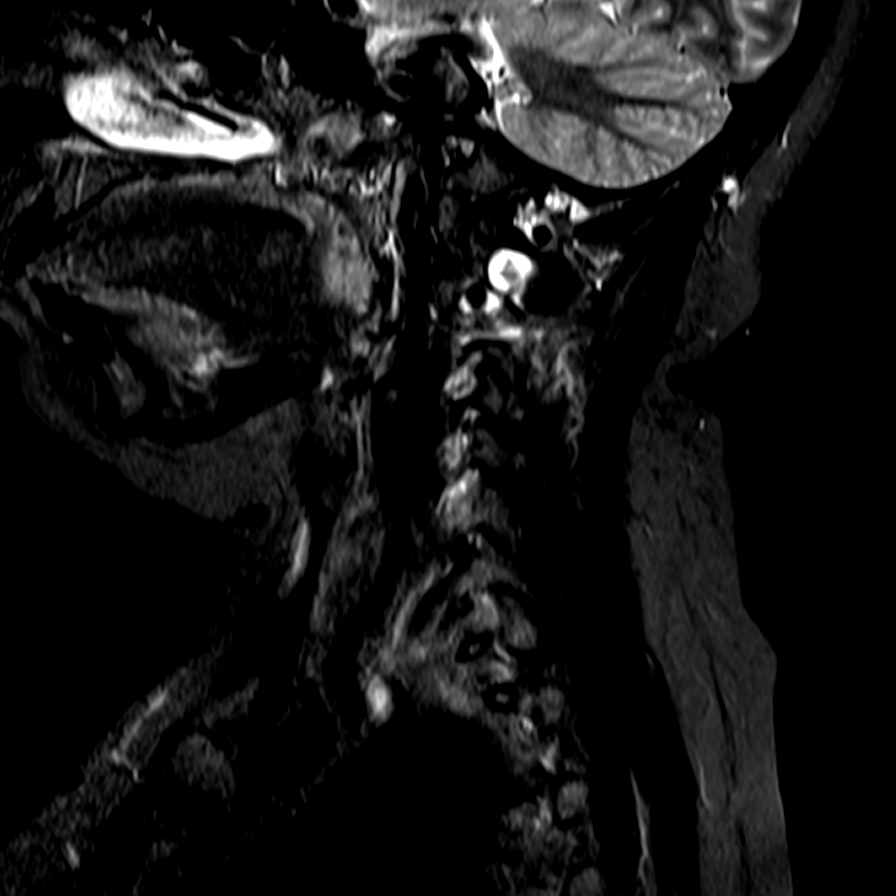

[Series 7: T2 · axial · 3.0mm · 0.21mm/px · z∈[-79,-2]mm · 3 of 36 slices shown (2 of 2)]
[im 6/36]
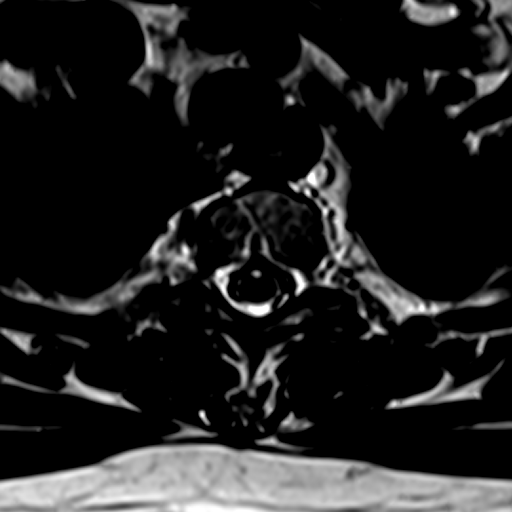
[im 19/36]
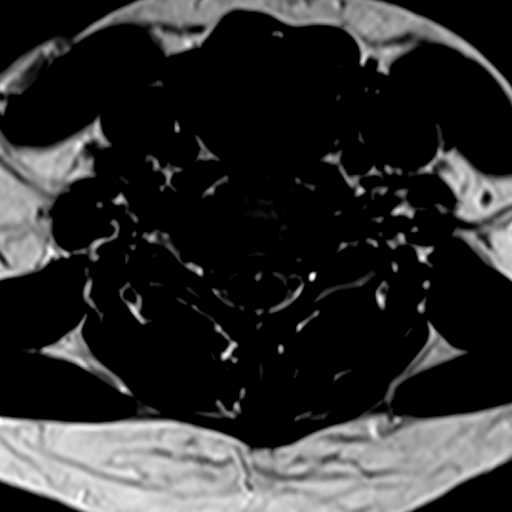
[im 30/36]
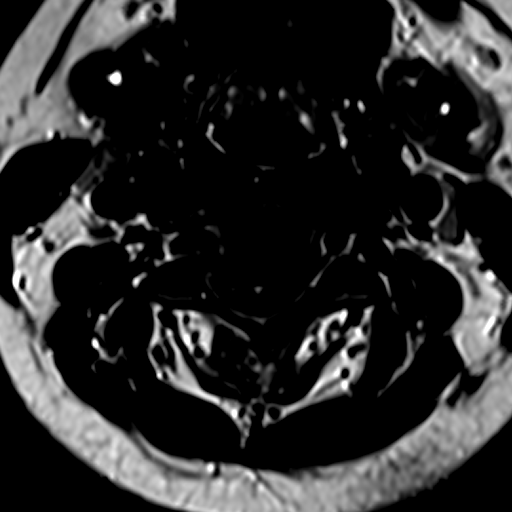

[13 of 48 positions shown; findings below may reference images not displayed]

FINDINGS: Straightening and reversal of the normal cervical lordosis.
Congenital fusion anomaly is present at C2-C3 with ankylosis of the
C2-C3 facet joints. The axial images are of good quality however the
sagittal T1 weighted images are technically degraded by motion
artifact, despite repeated images. Posterior fossa structures are
within normal limits. There are flow voids present in both vertebral
arteries. Marrow signal is within normal limits. Severe multilevel
degenerative disc disease is present with disk desiccation and loss
of height.

C2-C3: Ankylosis/congenital fusion anomaly. No stenosis. Tiny
remnant of disc.

C3-C4: Moderate central stenosis. AP diameter of the central canal
is 8 mm. Broad-based disc osteophyte complex produces a most of the
central stenosis with effacement of the ventral subarachnoid space
and flattening of the ventral cord. There is bilateral foraminal
stenosis secondary to uncovertebral spurring.

C4-C5: Mild central stenosis associated with broad-based disc
osteophyte complex. AP diameter of the thecal sac is 9 mm. Mild
flattening of the ventral cord and narrowing of the ventral
subarachnoid space. Left-greater-than- right foraminal stenosis due
to uncovertebral spurring. This potentially affects both C5 nerves.

C5-C6: Mild central stenosis due to a shallow broad-based disc
osteophyte complex. Mild uncovertebral spurring. The foramina appear
adequately patent.

C6-C7: Mild to moderate central stenosis with broad-based disc
osteophyte complex. Narrowing of the ventral subarachnoid space with
disc osteophyte complex just contacting the ventral cord. There is
left-greater-than-right bilateral foraminal stenosis potentially
affecting both C7 nerves. Foraminal stenosis is due to uncovertebral
spurring.

C7-T1: Shallow disc osteophyte complex. Central canal appears
adequately patent. Bilateral uncovertebral spurring is present
producing right greater than left foraminal stenosis. Lateral
osteophyte on the right contacts the exiting right C8 nerve. Less
pronounced left foraminal stenosis.
IMPRESSION: Moderate multilevel cervical spondylosis, detailed above. There is
potential for bilateral nerve root encroachment at multiple levels
in this patient with bilateral upper extremity radicular symptoms.
Central stenosis is most pronounced at C3-C4 and C6-C7.

## 2016-05-14 ENCOUNTER — Telehealth: Payer: Self-pay

## 2016-05-14 NOTE — Telephone Encounter (Signed)
Spoke with patient after she had a myelogram here 05/11/16.  Reports being sore in her low back and neck/shoulder area after the myelo, but she denies any headache.  jkl

## 2016-05-19 IMAGING — MG MM DIGITAL SCREENING BILAT W/ CAD
4 series · 4 of 4 positions shown · non-contrast
Comparison: Previous exam(s).

CLINICAL DATA: Screening.

EXAM:
DIGITAL SCREENING BILATERAL MAMMOGRAM WITH CAD

[L CC]
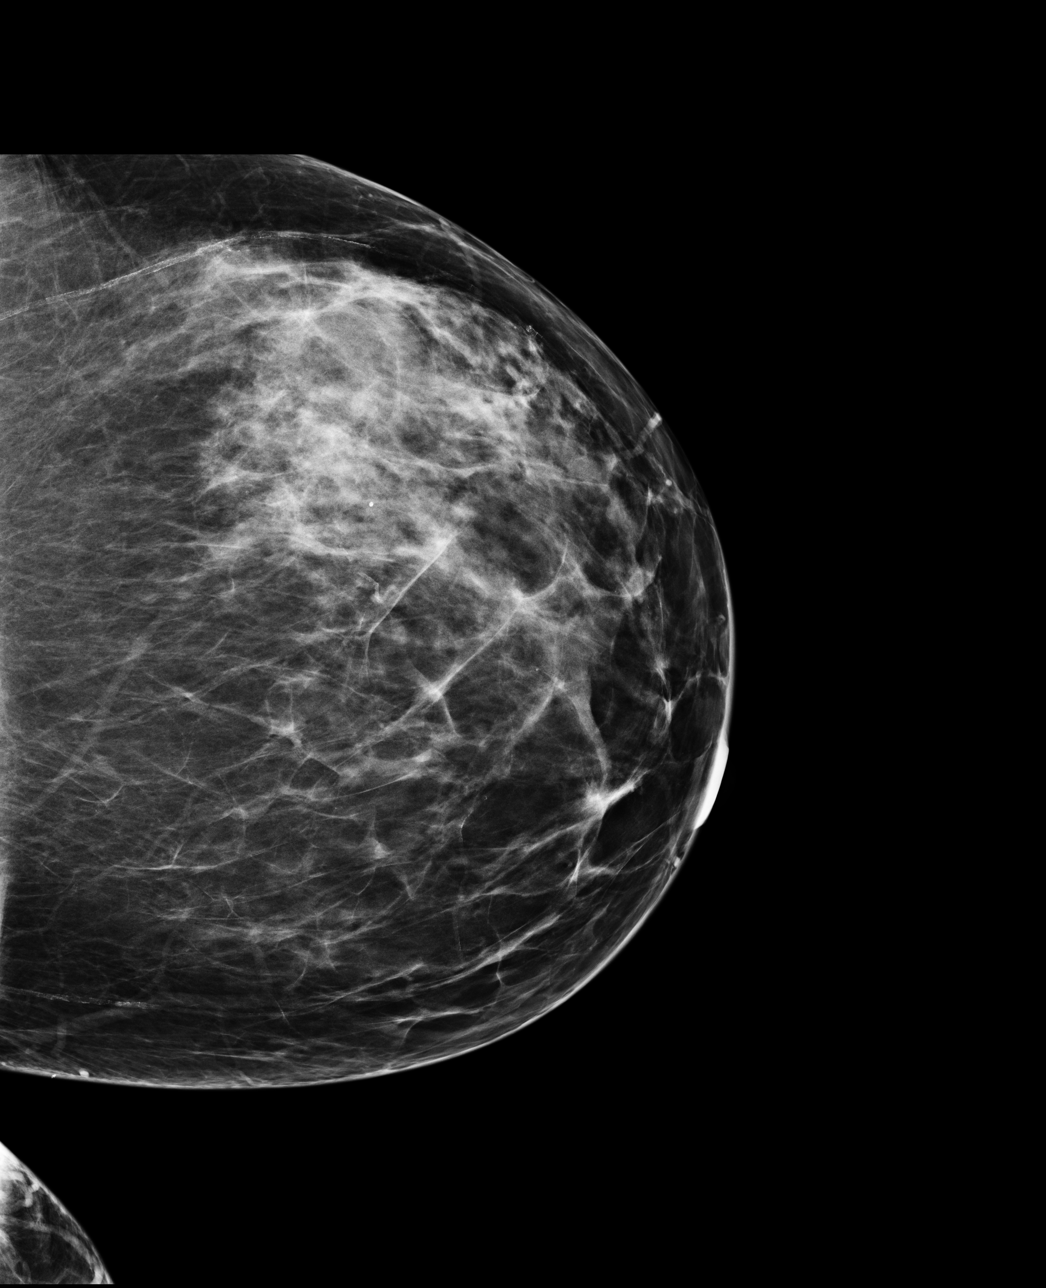

[L MLO]
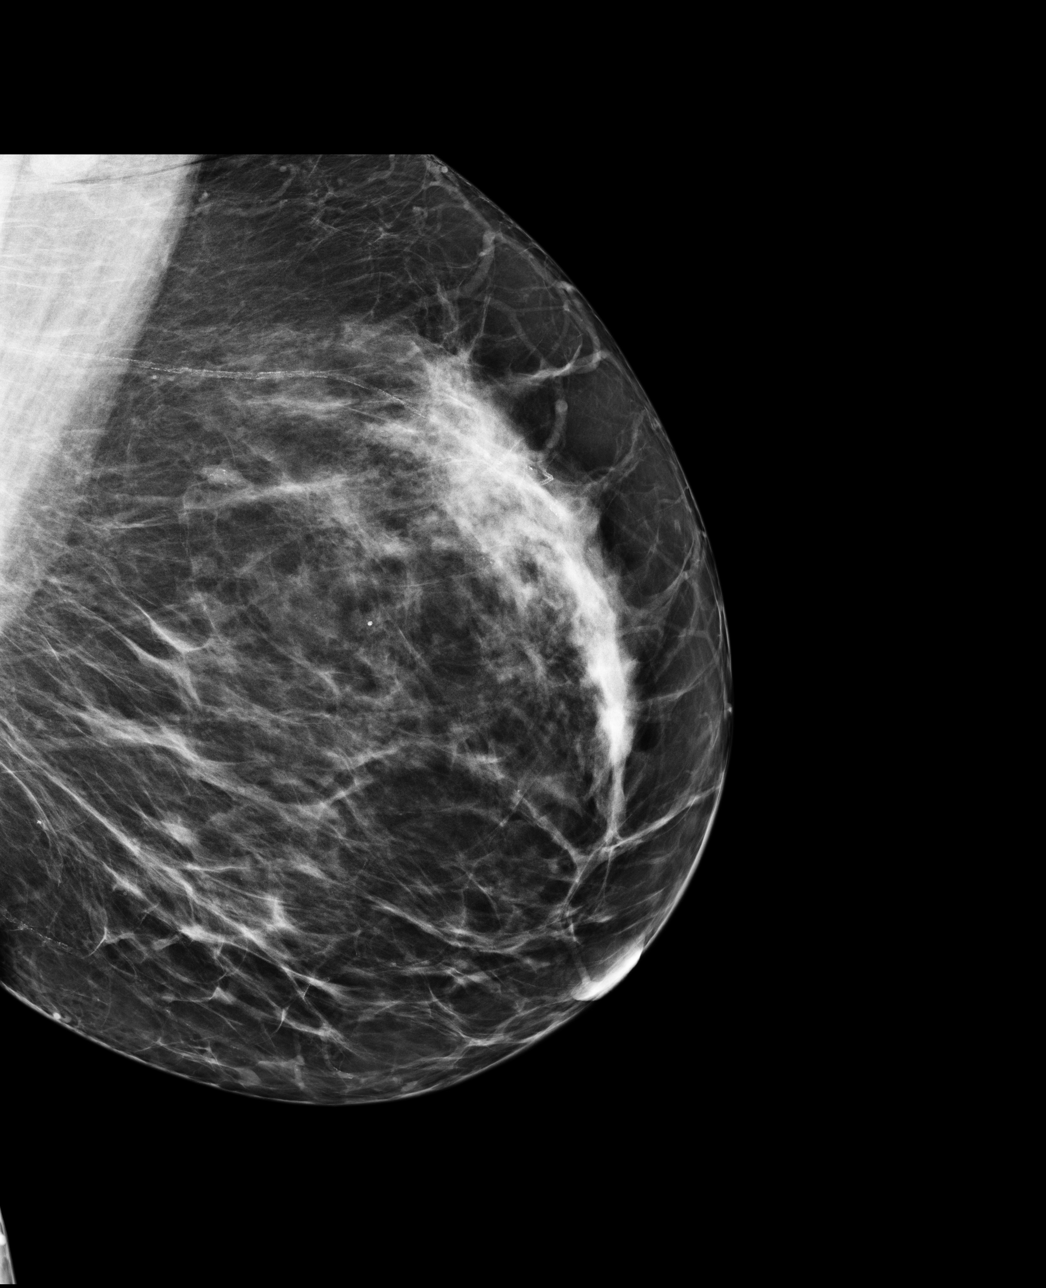

[R CC]
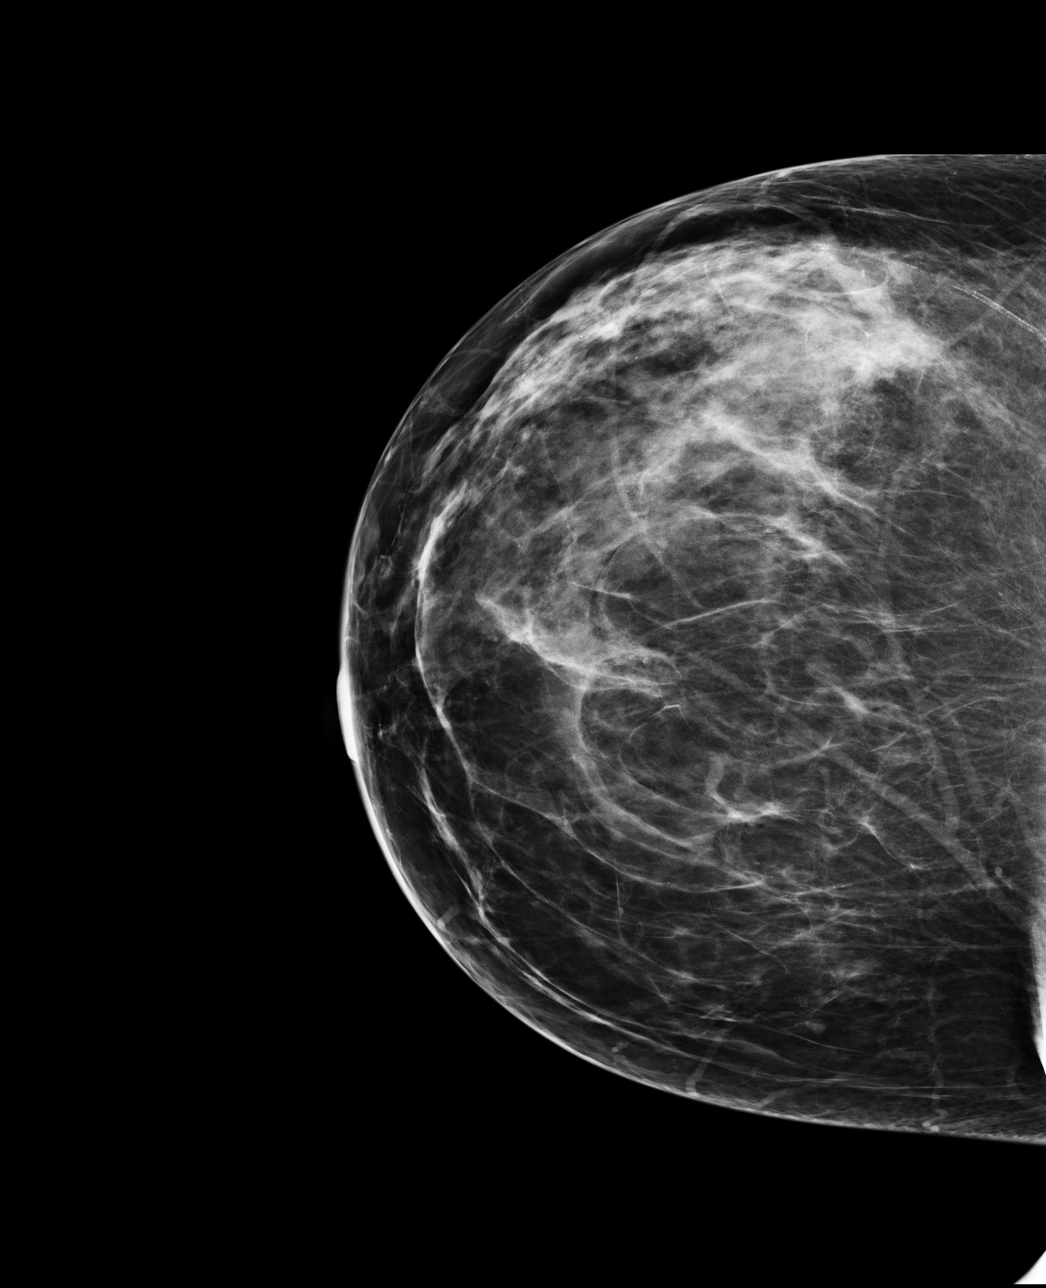

[R MLO]
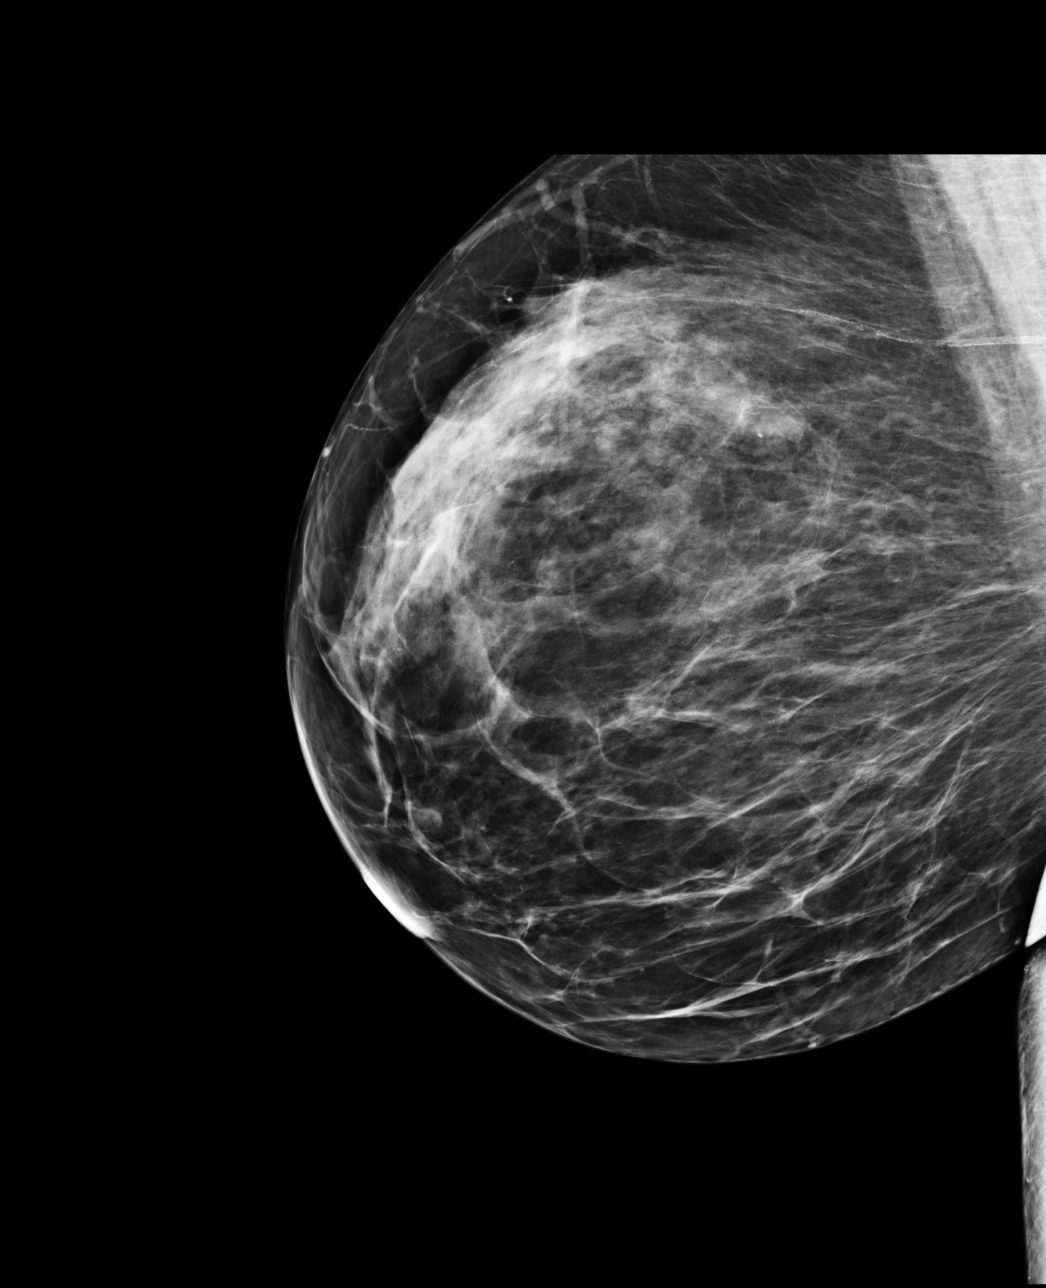

[4 of 4 positions shown; findings below may reference images not displayed]

ACR Breast Density Category b: There are scattered areas of
fibroglandular density.
FINDINGS: There are no findings suspicious for malignancy. Images were
processed with CAD.
IMPRESSION: No mammographic evidence of malignancy. A result letter of this
screening mammogram will be mailed directly to the patient.

RECOMMENDATION:
Screening mammogram in one year. (Code:AS-G-LCT)

BI-RADS CATEGORY  1: Negative.

## 2016-06-01 IMAGING — US US TRANSVAGINAL NON-OB
1 series · 13 of 25 positions shown · non-contrast
Comparison: CT on 12/17/2009 and ultrasound on 10/06/2013

CLINICAL DATA: Pelvic pain. History of ovarian cysts. Prior history
of hysterectomy and right oophorectomy.

EXAM:
TRANSABDOMINAL AND TRANSVAGINAL ULTRASOUND OF PELVIS
TECHNIQUE: Both transabdominal and transvaginal ultrasound examinations of the
pelvis were performed. Transabdominal technique was performed for
global imaging of the pelvis including uterus, ovaries, adnexal
regions, and pelvic cul-de-sac. It was necessary to proceed with
endovaginal exam following the transabdominal exam to visualize the
vaginal cuff and cystic lesion in left adnexa.

[Series 1: us transvaginal non-ob · 0.23mm/px · 13 of 37 slices shown]
[im 1/37]
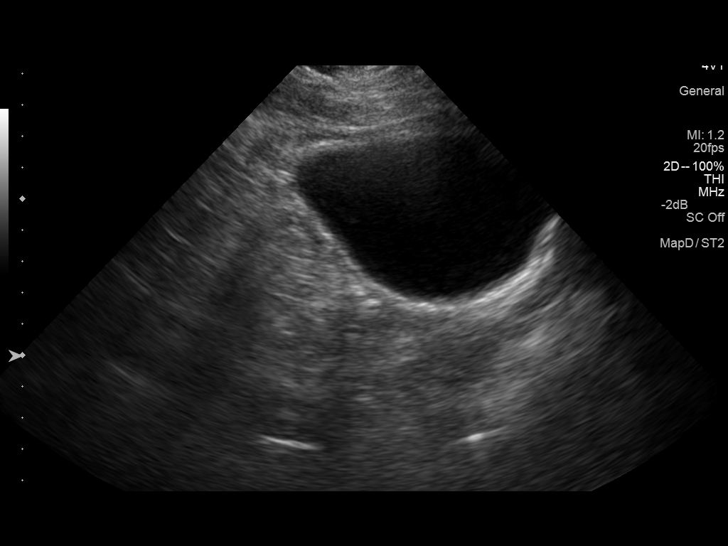
[im 4/37]
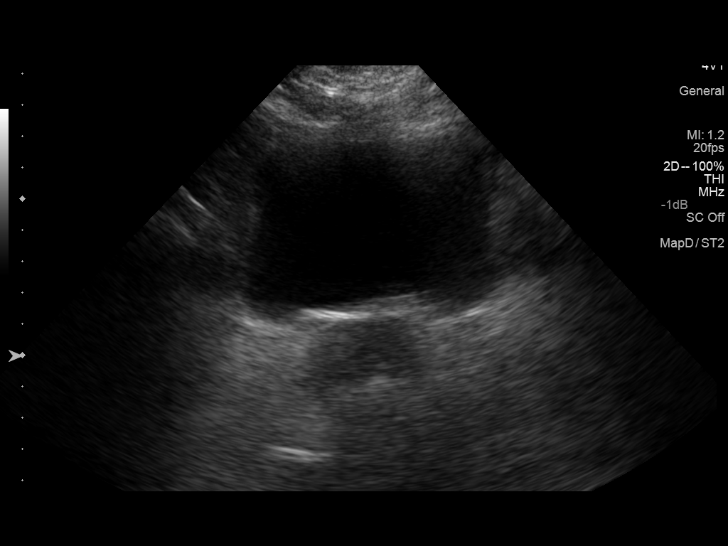
[im 7/37]
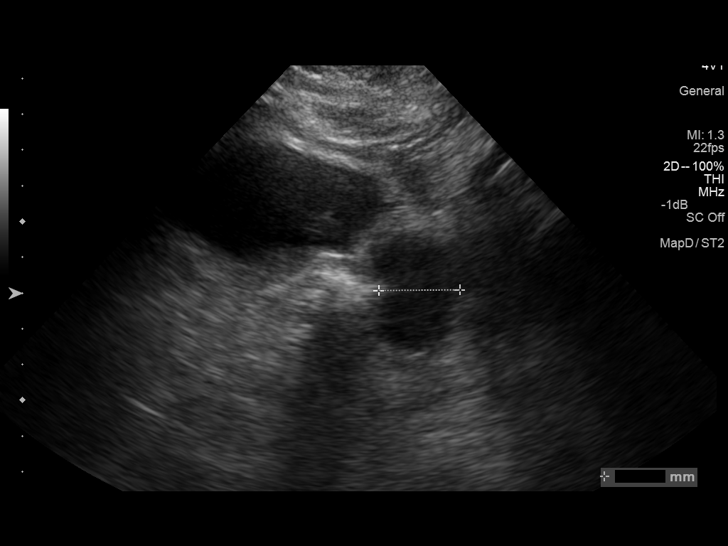
[im 10/37]
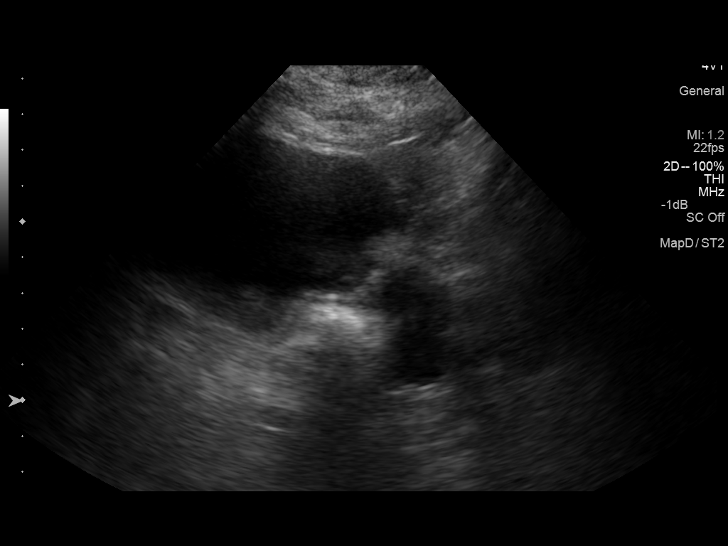
[im 13/37]
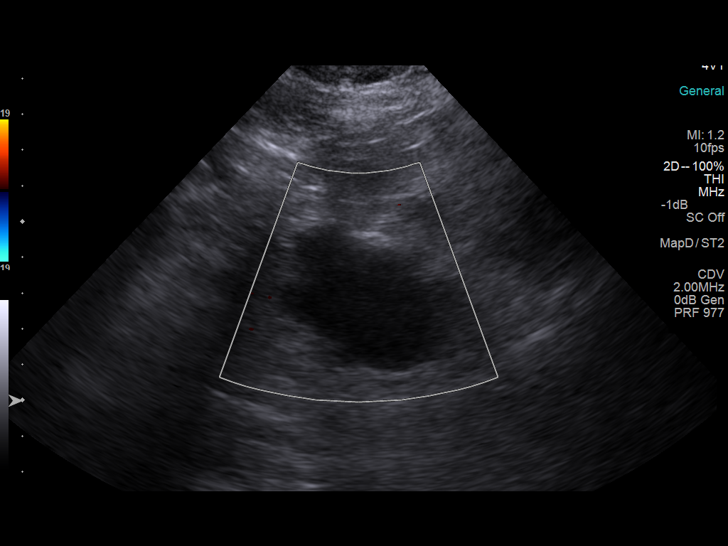
[im 16/37]
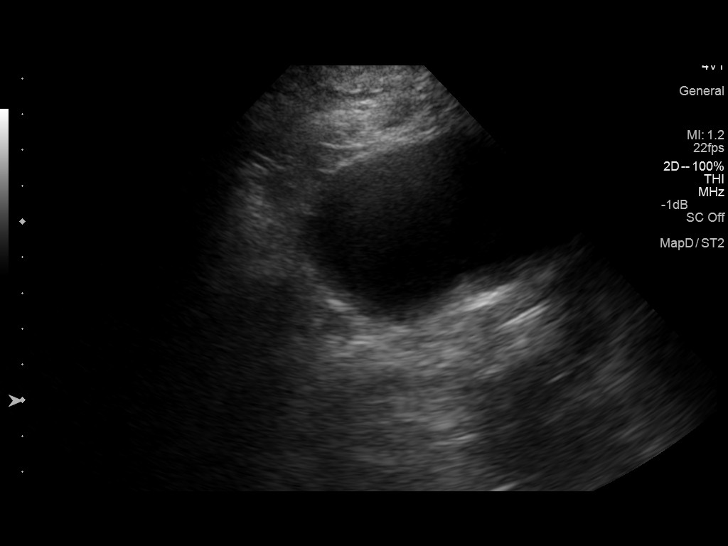
[im 19/37]
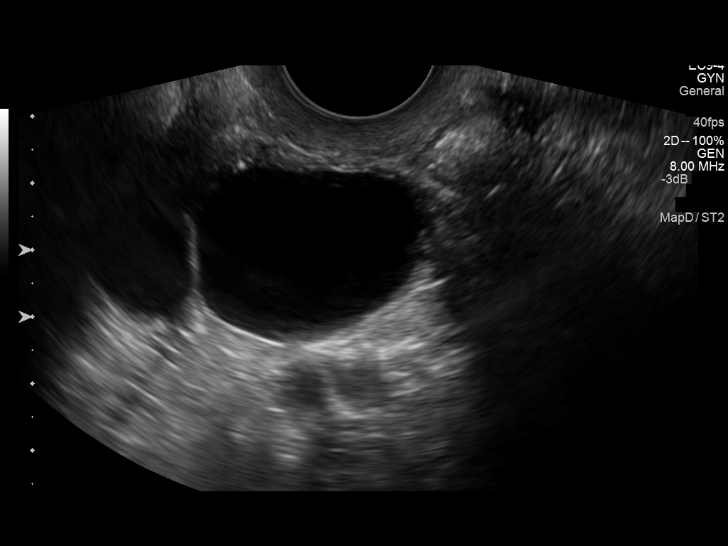
[im 22/37]
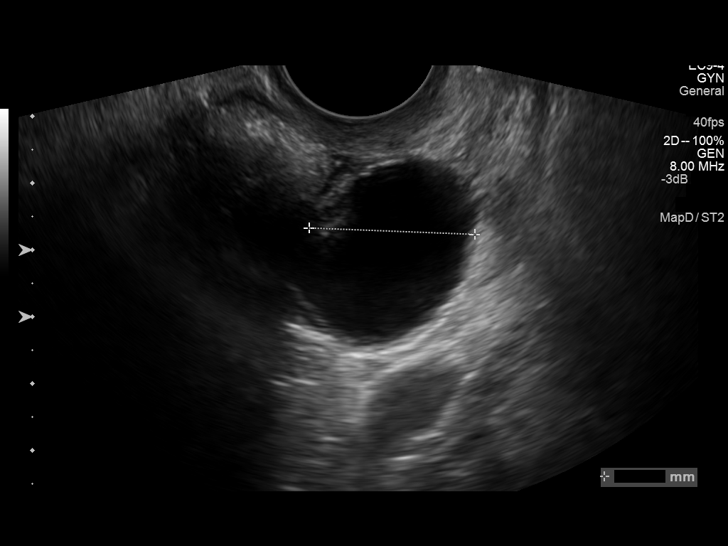
[im 25/37]
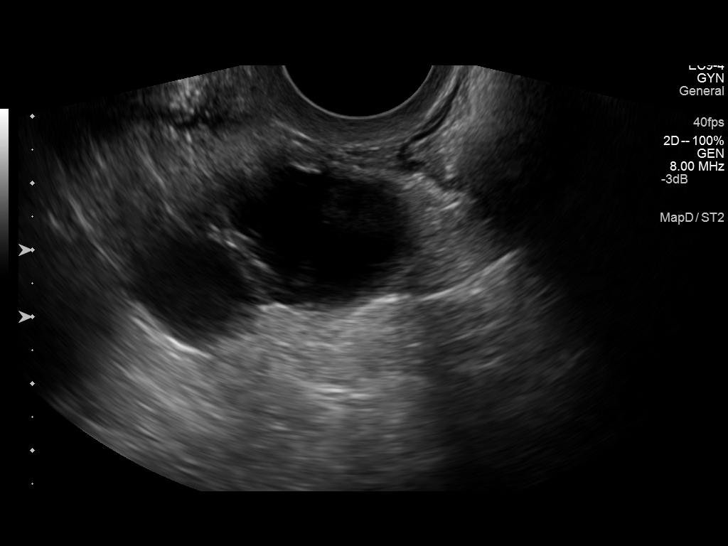
[im 28/37]
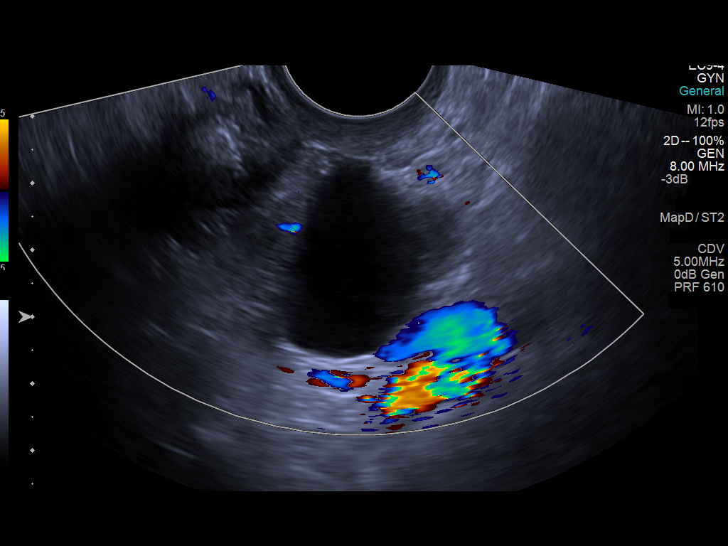
[im 31/37]
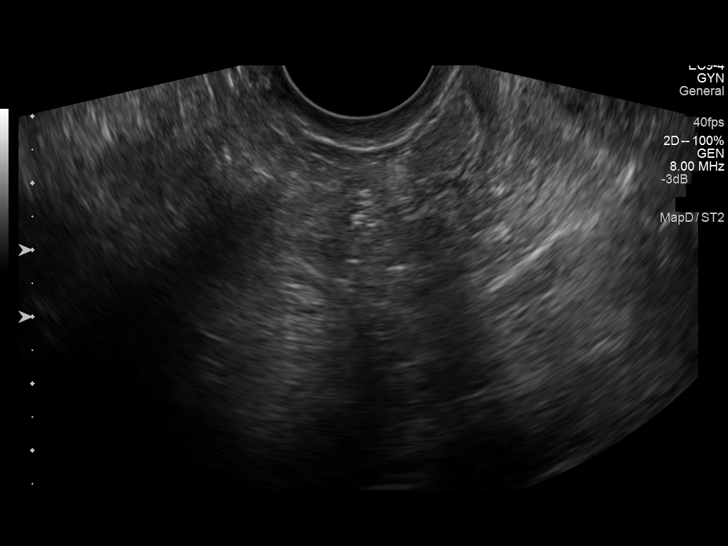
[im 34/37]
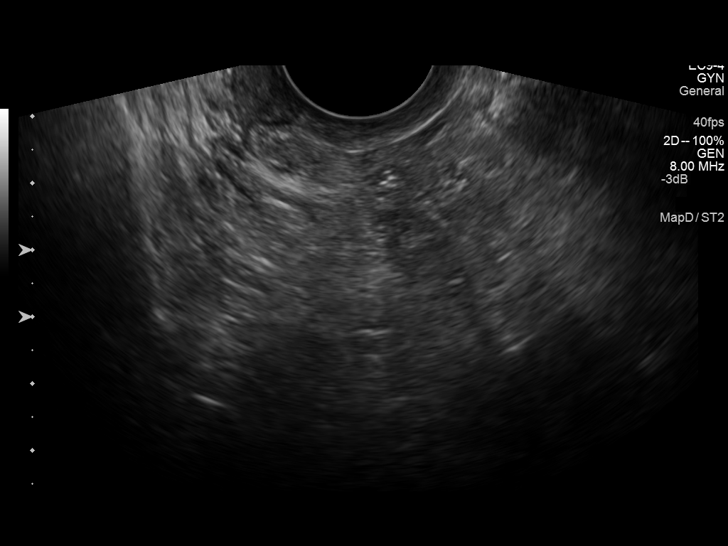
[im 37/37]
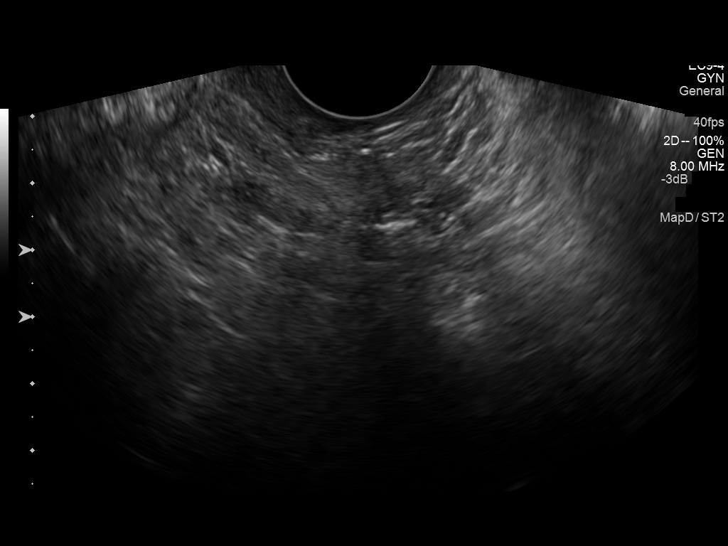

[13 of 25 positions shown; findings below may reference images not displayed]

FINDINGS: Uterus

Measurements: Surgically absent. Vaginal cuff is unremarkable in
appearance .

Endometrium

Thickness: Not applicable.

Right ovary

Measurements: Surgically absent by history. No adnexal mass
identified.

Left ovary

Measurements: No normal ovary visualized. A bilobed cystic lesion
containing a single thin septation is again seen in the left adnexa.
This measures 5.5 x 2.8 x 2.5 cm. This shows no significant change
in size or appearance since previous study in 6006, consistent with
a benign etiology.

Other findings

No free fluid.
IMPRESSION: Stable 5.5 cm cystic lesion in left adnexa since 6006 exam,
consistent with benign etiology. No other pelvic mass or free fluid
visualized.

## 2016-06-16 IMAGING — CR DG CHEST 2V
2 series · 2 of 2 positions shown · non-contrast
Comparison: 07/02/2013

CLINICAL DATA: Cough, congestion

EXAM:
CHEST  2 VIEW

[view not recorded (1 of 2)]
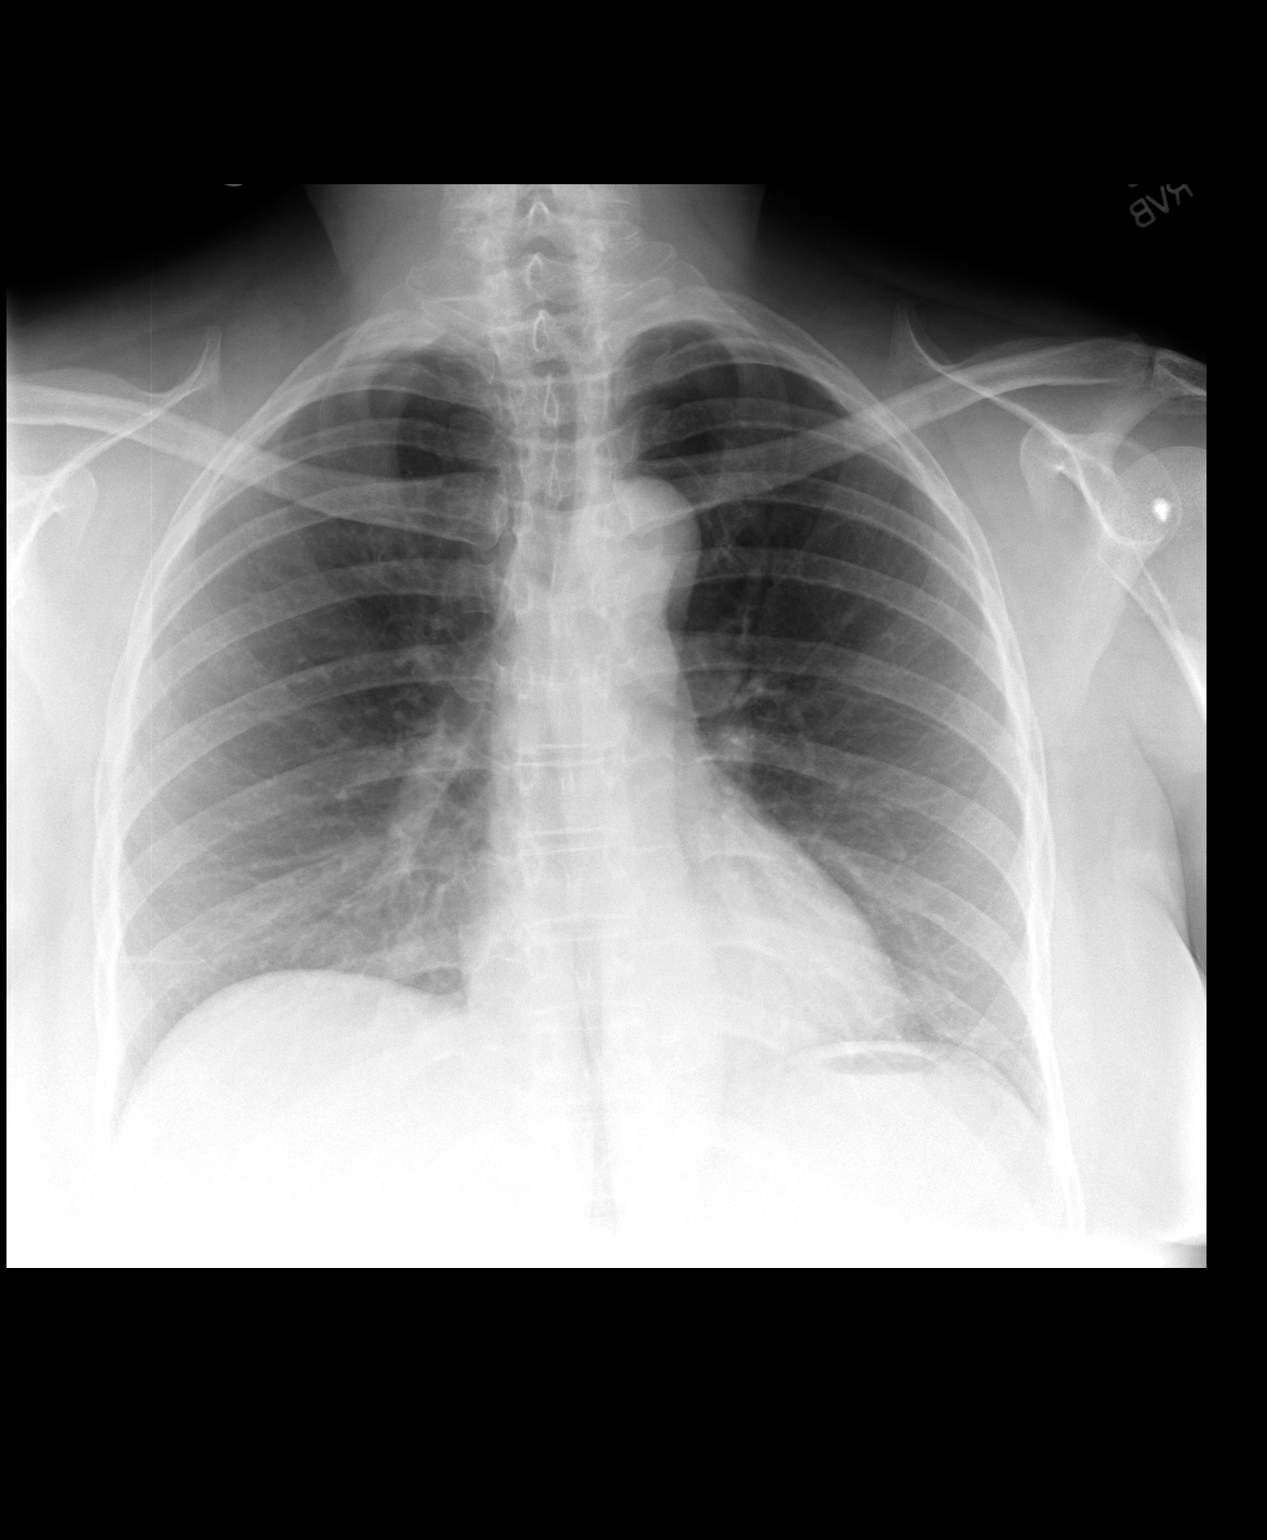

[view not recorded (2 of 2)]
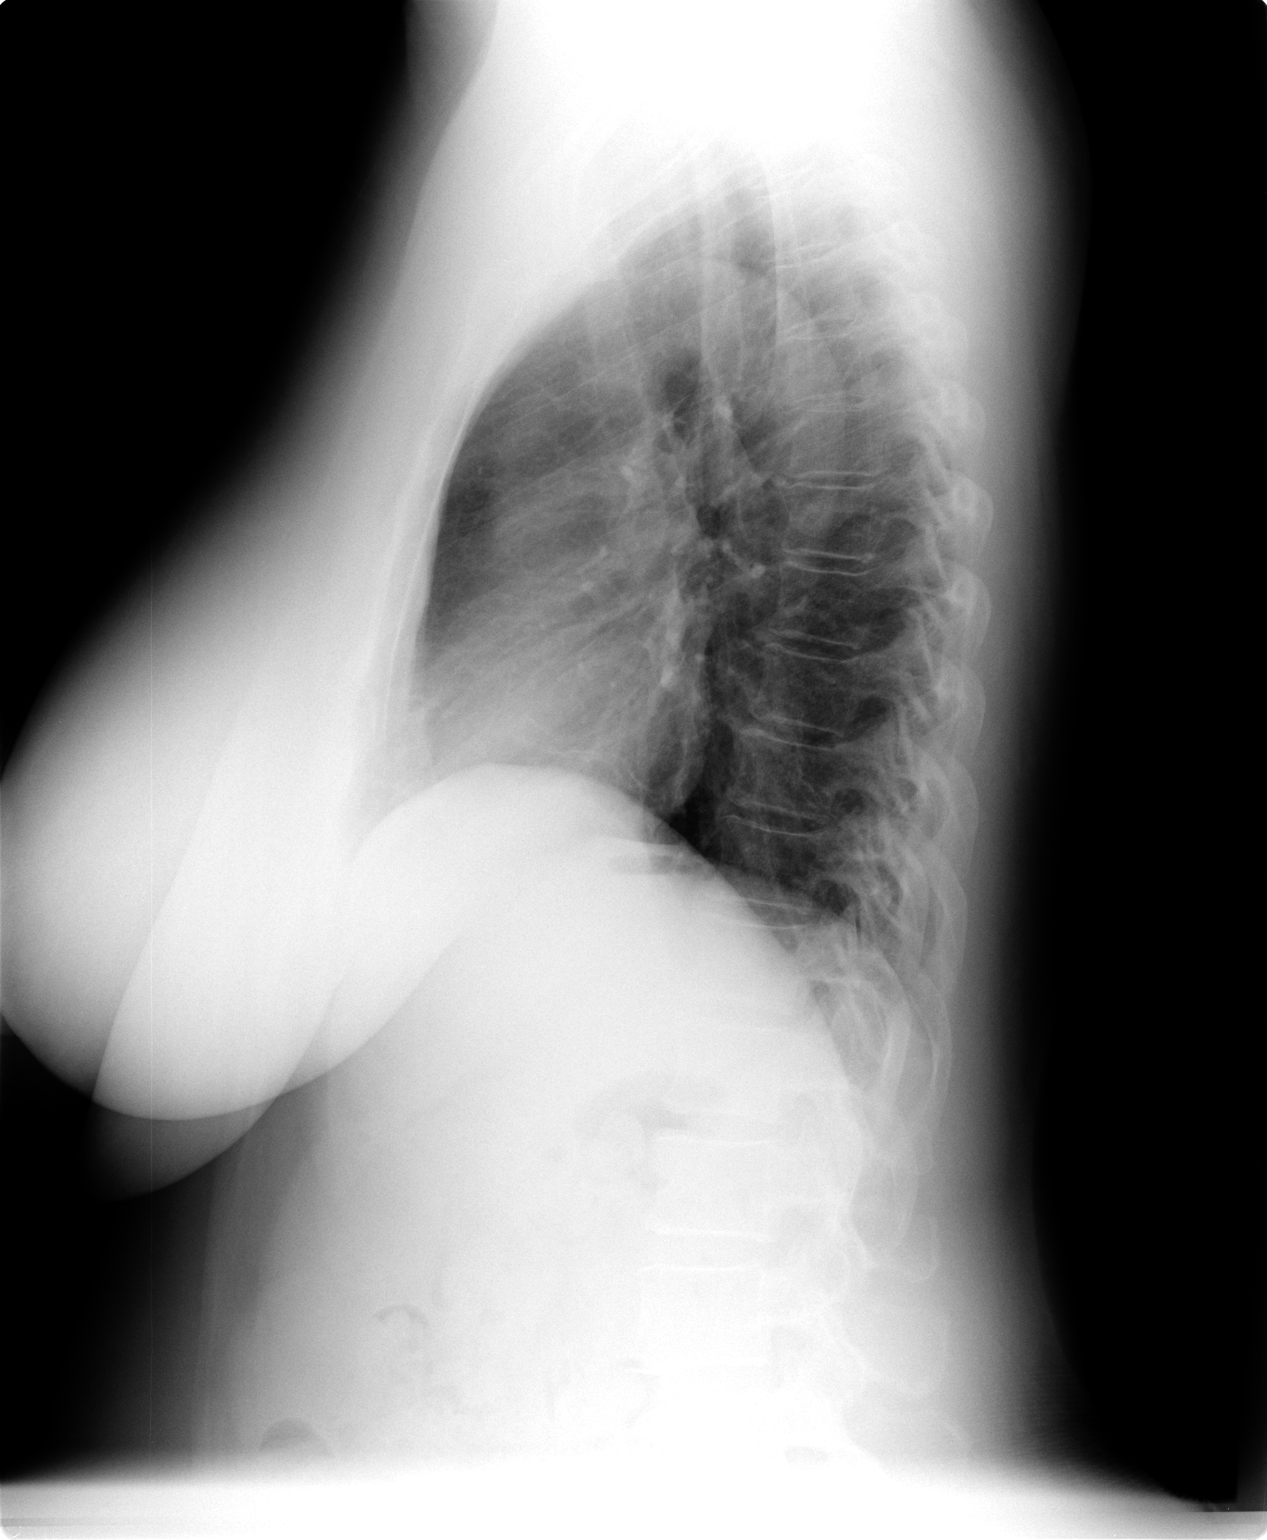

[2 of 2 positions shown; findings below may reference images not displayed]

FINDINGS: The heart size and mediastinal contours are within normal limits.
Both lungs are clear. The visualized skeletal structures are
unremarkable.
IMPRESSION: No active cardiopulmonary disease.

## 2016-06-18 ENCOUNTER — Other Ambulatory Visit: Payer: Self-pay | Admitting: Nurse Practitioner

## 2016-06-18 DIAGNOSIS — G44229 Chronic tension-type headache, not intractable: Secondary | ICD-10-CM

## 2016-07-03 ENCOUNTER — Encounter (HOSPITAL_COMMUNITY): Payer: Self-pay | Admitting: General Practice

## 2016-07-03 ENCOUNTER — Other Ambulatory Visit: Payer: Self-pay | Admitting: Neurosurgery

## 2016-07-03 NOTE — Progress Notes (Signed)
Anesthesia Chart Review:  Pt is a same day work up.   Pt is a 55 year old female scheduled for C4-5, C5-6 ACDF, revision of C3-4 plate on [id number] with Leeroy Cha, MD.   - PCP is Glendon Axe, MD (notes in care everywhere).   PMH includes:  HTN, COPD, post-op N/V, GERD. Never smoker. BMI 27.5. S/p cholecystectomy 06/17/15. S/p shoulder arthroscopy 05/18/15. S/p ACDF 08/06/14.   Anesthesia history includes: difficult airway. Anesthesia records in [place]. Intubation notes from cholecystectomy November 2016 document grade II view, glidescope used, successful intubation after 3 attempts. Comments: "Started with glidescope, first look unable to pass. Needed extreme J shape to ETT. Very anterior larynx. Next pass with glide and ett, fogged lens. Third pass successful, intubated trachea, etco2, fog in tube. Sats 98-100% for duration of intubation attempts. Easy mask vent."  Medications include: Amlodipine, ASA, Symbicort, protonix  Labs will be obtained DOS.   EKG requested from PCP. If it does not arrive, please obtain EKG DOS.   Carotid duplex 07/07/14: minimal heterogeneous plaque, with no hemodynamically significant stenosis  Pt reports she had stress test last week at [facility] in [place]. Results requested.  Per telephone encounter 07/02/16 in care everywhere, stress test was normal.   If EKG and labs acceptable, I anticipate pt can proceed as scheduled.   Willeen Cass, FNP-BC [facility]/Anesthesiology Phone: [phone] 07/03/2016 4:27 PM

## 2016-07-03 NOTE — Progress Notes (Signed)
Preop phone call completed.  Patient denies chest pain and shortness of breath.  Patient did state that she recently had an EKG and stress test completed last week at [facility] (Dr. Glendon Axe).  Patient reported that there were no complications.  Nurse will attempt to get records.    Patient also stated that after stress test, she noticed a red spot on her left breast and that she informed Dr. Candiss Norse.  Dr. Candiss Norse prescribed her Clotrimazole to put on red spot.

## 2016-07-04 ENCOUNTER — Ambulatory Visit (HOSPITAL_COMMUNITY): Payer: BLUE CROSS/BLUE SHIELD | Admitting: Emergency Medicine

## 2016-07-04 ENCOUNTER — Encounter (HOSPITAL_COMMUNITY): Payer: Self-pay | Admitting: Surgery

## 2016-07-04 ENCOUNTER — Encounter (HOSPITAL_COMMUNITY)
Admission: AD | Disposition: A | Discharge status: Home Health | Payer: Self-pay | Source: Ambulatory Visit | Attending: Neurosurgery

## 2016-07-04 ENCOUNTER — Inpatient Hospital Stay (HOSPITAL_COMMUNITY)
Admission: AD | Admit: 2016-07-04 | Discharge: 2016-07-10 | DRG: 471 | Disposition: A | Discharge status: Home Health | Payer: BLUE CROSS/BLUE SHIELD | Source: Ambulatory Visit | Attending: Neurosurgery | Admitting: Neurosurgery

## 2016-07-04 ENCOUNTER — Ambulatory Visit (HOSPITAL_COMMUNITY): Payer: BLUE CROSS/BLUE SHIELD

## 2016-07-04 DIAGNOSIS — Z9049 Acquired absence of other specified parts of digestive tract: Secondary | ICD-10-CM | POA: Diagnosis not present

## 2016-07-04 DIAGNOSIS — Z885 Allergy status to narcotic agent status: Secondary | ICD-10-CM

## 2016-07-04 DIAGNOSIS — M4722 Other spondylosis with radiculopathy, cervical region: Principal | ICD-10-CM | POA: Diagnosis present

## 2016-07-04 DIAGNOSIS — M1990 Unspecified osteoarthritis, unspecified site: Secondary | ICD-10-CM | POA: Diagnosis present

## 2016-07-04 DIAGNOSIS — J9383 Other pneumothorax: Secondary | ICD-10-CM | POA: Diagnosis not present

## 2016-07-04 DIAGNOSIS — Z9889 Other specified postprocedural states: Secondary | ICD-10-CM

## 2016-07-04 DIAGNOSIS — Z8249 Family history of ischemic heart disease and other diseases of the circulatory system: Secondary | ICD-10-CM

## 2016-07-04 DIAGNOSIS — Z90710 Acquired absence of both cervix and uterus: Secondary | ICD-10-CM

## 2016-07-04 DIAGNOSIS — Z87442 Personal history of urinary calculi: Secondary | ICD-10-CM | POA: Diagnosis not present

## 2016-07-04 DIAGNOSIS — I10 Essential (primary) hypertension: Secondary | ICD-10-CM | POA: Diagnosis present

## 2016-07-04 DIAGNOSIS — Z981 Arthrodesis status: Secondary | ICD-10-CM | POA: Diagnosis not present

## 2016-07-04 DIAGNOSIS — J930 Spontaneous tension pneumothorax: Secondary | ICD-10-CM | POA: Diagnosis not present

## 2016-07-04 DIAGNOSIS — Z91013 Allergy to seafood: Secondary | ICD-10-CM

## 2016-07-04 DIAGNOSIS — J449 Chronic obstructive pulmonary disease, unspecified: Secondary | ICD-10-CM | POA: Diagnosis present

## 2016-07-04 DIAGNOSIS — J9811 Atelectasis: Secondary | ICD-10-CM | POA: Diagnosis not present

## 2016-07-04 DIAGNOSIS — Z7951 Long term (current) use of inhaled steroids: Secondary | ICD-10-CM | POA: Diagnosis not present

## 2016-07-04 DIAGNOSIS — K219 Gastro-esophageal reflux disease without esophagitis: Secondary | ICD-10-CM | POA: Diagnosis present

## 2016-07-04 DIAGNOSIS — J9601 Acute respiratory failure with hypoxia: Secondary | ICD-10-CM | POA: Diagnosis not present

## 2016-07-04 DIAGNOSIS — R262 Difficulty in walking, not elsewhere classified: Secondary | ICD-10-CM

## 2016-07-04 DIAGNOSIS — M4802 Spinal stenosis, cervical region: Secondary | ICD-10-CM | POA: Diagnosis present

## 2016-07-04 DIAGNOSIS — Z7982 Long term (current) use of aspirin: Secondary | ICD-10-CM

## 2016-07-04 DIAGNOSIS — Z79899 Other long term (current) drug therapy: Secondary | ICD-10-CM

## 2016-07-04 DIAGNOSIS — J939 Pneumothorax, unspecified: Secondary | ICD-10-CM

## 2016-07-04 DIAGNOSIS — Z8701 Personal history of pneumonia (recurrent): Secondary | ICD-10-CM

## 2016-07-04 DIAGNOSIS — M542 Cervicalgia: Secondary | ICD-10-CM | POA: Diagnosis present

## 2016-07-04 DIAGNOSIS — F419 Anxiety disorder, unspecified: Secondary | ICD-10-CM | POA: Diagnosis present

## 2016-07-04 DIAGNOSIS — Z419 Encounter for procedure for purposes other than remedying health state, unspecified: Secondary | ICD-10-CM

## 2016-07-04 DIAGNOSIS — R079 Chest pain, unspecified: Secondary | ICD-10-CM

## 2016-07-04 DIAGNOSIS — M5030 Other cervical disc degeneration, unspecified cervical region: Secondary | ICD-10-CM | POA: Diagnosis present

## 2016-07-04 DIAGNOSIS — F329 Major depressive disorder, single episode, unspecified: Secondary | ICD-10-CM | POA: Diagnosis present

## 2016-07-04 DIAGNOSIS — R0789 Other chest pain: Secondary | ICD-10-CM | POA: Diagnosis present

## 2016-07-04 DIAGNOSIS — J45909 Unspecified asthma, uncomplicated: Secondary | ICD-10-CM | POA: Diagnosis not present

## 2016-07-04 DIAGNOSIS — Z91010 Allergy to peanuts: Secondary | ICD-10-CM | POA: Diagnosis not present

## 2016-07-04 HISTORY — PX: ANTERIOR CERVICAL DECOMP/DISCECTOMY FUSION: SHX1161

## 2016-07-04 LAB — BASIC METABOLIC PANEL
Anion gap: 9 (ref 5–15)
BUN: 6 mg/dL (ref 6–20)
CHLORIDE: 102 mmol/L (ref 101–111)
CO2: 26 mmol/L (ref 22–32)
CREATININE: 0.67 mg/dL (ref 0.44–1.00)
Calcium: 9.3 mg/dL (ref 8.9–10.3)
GFR calc non Af Amer: >60 mL/min (ref >60)
Glucose, Bld: 87 mg/dL (ref 65–99)
Potassium: 3.6 mmol/L (ref 3.5–5.1)
Sodium: 137 mmol/L (ref 135–145)

## 2016-07-04 LAB — TYPE AND SCREEN
ABO/RH(D): B POS
Antibody Screen: NEGATIVE

## 2016-07-04 LAB — CBC
HEMATOCRIT: 40 % (ref 36.0–46.0)
Hemoglobin: 13.3 g/dL (ref 12.0–15.0)
MCH: 30.8 pg (ref 26.0–34.0)
MCHC: 33.3 g/dL (ref 30.0–36.0)
MCV: 92.6 fL (ref 78.0–100.0)
PLATELETS: 236 10*3/uL (ref 150–400)
RBC: 4.32 MIL/uL (ref 3.87–5.11)
RDW: 12.2 % (ref 11.5–15.5)
WBC: 11.4 10*3/uL — ABNORMAL HIGH (ref 4.0–10.5)

## 2016-07-04 SURGERY — ANTERIOR CERVICAL DECOMPRESSION/DISCECTOMY FUSION 2 LEVELS
Anesthesia: General | Site: Neck

## 2016-07-04 MED ORDER — ONDANSETRON HCL 4 MG/2ML IJ SOLN
INTRAMUSCULAR | Status: AC
  Filled 2016-07-04: qty 2

## 2016-07-04 MED ORDER — PROPOFOL 10 MG/ML IV BOLUS
INTRAVENOUS | Status: AC
  Filled 2016-07-04: qty 20

## 2016-07-04 MED ORDER — AMLODIPINE BESYLATE 5 MG PO TABS
5.0000 mg | ORAL_TABLET | Freq: Every day | ORAL | Status: DC
  Administered 2016-07-05 – 2016-07-10 (×6): 5 mg via ORAL
  Filled 2016-07-04 (×6): qty 1

## 2016-07-04 MED ORDER — ESTRADIOL 1 MG PO TABS
1.0000 mg | ORAL_TABLET | Freq: Every day | ORAL | Status: DC
  Administered 2016-07-05 – 2016-07-10 (×6): 1 mg via ORAL
  Filled 2016-07-04 (×8): qty 1

## 2016-07-04 MED ORDER — FENTANYL CITRATE (PF) 100 MCG/2ML IJ SOLN
25.0000 ug | INTRAMUSCULAR | Status: DC | PRN
  Administered 2016-07-04: 50 ug via INTRAVENOUS

## 2016-07-04 MED ORDER — SUGAMMADEX SODIUM 200 MG/2ML IV SOLN
INTRAVENOUS | Status: DC | PRN
  Administered 2016-07-04: 200 mg via INTRAVENOUS

## 2016-07-04 MED ORDER — DIAZEPAM 5 MG PO TABS
5.0000 mg | ORAL_TABLET | Freq: Four times a day (QID) | ORAL | Status: DC | PRN
  Filled 2016-07-04: qty 1

## 2016-07-04 MED ORDER — MIDAZOLAM HCL 5 MG/5ML IJ SOLN
INTRAMUSCULAR | Status: DC | PRN
  Administered 2016-07-04: 2 mg via INTRAVENOUS

## 2016-07-04 MED ORDER — PROPOFOL 10 MG/ML IV BOLUS
INTRAVENOUS | Status: DC | PRN
  Administered 2016-07-04: 150 mg via INTRAVENOUS

## 2016-07-04 MED ORDER — OXYBUTYNIN CHLORIDE ER 5 MG PO TB24
5.0000 mg | ORAL_TABLET | Freq: Every day | ORAL | Status: DC
  Administered 2016-07-05 – 2016-07-09 (×5): 5 mg via ORAL
  Filled 2016-07-04 (×6): qty 1

## 2016-07-04 MED ORDER — PHENYLEPHRINE 40 MCG/ML (10ML) SYRINGE FOR IV PUSH (FOR BLOOD PRESSURE SUPPORT)
PREFILLED_SYRINGE | INTRAVENOUS | Status: DC | PRN
  Administered 2016-07-04: 80 ug via INTRAVENOUS
  Administered 2016-07-04: 40 ug via INTRAVENOUS
  Administered 2016-07-04 (×3): 80 ug via INTRAVENOUS
  Administered 2016-07-04: 40 ug via INTRAVENOUS

## 2016-07-04 MED ORDER — THROMBIN 5000 UNITS EX SOLR
CUTANEOUS | Status: AC
  Filled 2016-07-04: qty 15000

## 2016-07-04 MED ORDER — PHENOL 1.4 % MT LIQD
1.0000 | OROMUCOSAL | Status: DC | PRN
  Administered 2016-07-05: 1 via OROMUCOSAL
  Filled 2016-07-04: qty 177

## 2016-07-04 MED ORDER — DIPHENHYDRAMINE HCL 50 MG/ML IJ SOLN
INTRAMUSCULAR | Status: AC
  Filled 2016-07-04: qty 1

## 2016-07-04 MED ORDER — DEXAMETHASONE SODIUM PHOSPHATE 10 MG/ML IJ SOLN
INTRAMUSCULAR | Status: AC
  Filled 2016-07-04: qty 1

## 2016-07-04 MED ORDER — PANTOPRAZOLE SODIUM 20 MG PO TBEC
20.0000 mg | DELAYED_RELEASE_TABLET | Freq: Every day | ORAL | Status: DC
Start: 2016-07-05 — End: 2016-07-10
  Administered 2016-07-05 – 2016-07-10 (×6): 20 mg via ORAL
  Filled 2016-07-04 (×6): qty 1

## 2016-07-04 MED ORDER — THROMBIN 5000 UNITS EX SOLR
OROMUCOSAL | Status: DC | PRN
  Administered 2016-07-04: 5 mL via TOPICAL

## 2016-07-04 MED ORDER — ROCURONIUM BROMIDE 10 MG/ML (PF) SYRINGE
PREFILLED_SYRINGE | INTRAVENOUS | Status: DC | PRN
  Administered 2016-07-04: 40 mg via INTRAVENOUS

## 2016-07-04 MED ORDER — HEMOSTATIC AGENTS (NO CHARGE) OPTIME
TOPICAL | Status: DC | PRN
  Administered 2016-07-04: 1 via TOPICAL

## 2016-07-04 MED ORDER — SODIUM CHLORIDE 0.9% FLUSH: 3.0000 mL | INTRAVENOUS | Status: DC | PRN

## 2016-07-04 MED ORDER — PHENYLEPHRINE 40 MCG/ML (10ML) SYRINGE FOR IV PUSH (FOR BLOOD PRESSURE SUPPORT)
PREFILLED_SYRINGE | INTRAVENOUS | Status: AC
  Filled 2016-07-04: qty 10

## 2016-07-04 MED ORDER — SENNA 8.6 MG PO TABS
1.0000 | ORAL_TABLET | Freq: Two times a day (BID) | ORAL | Status: DC
  Administered 2016-07-05 – 2016-07-10 (×11): 8.6 mg via ORAL
  Filled 2016-07-04 (×11): qty 1

## 2016-07-04 MED ORDER — ACETAMINOPHEN 325 MG PO TABS: 650.0000 mg | ORAL_TABLET | ORAL | Status: DC | PRN

## 2016-07-04 MED ORDER — FENTANYL CITRATE (PF) 100 MCG/2ML IJ SOLN
INTRAMUSCULAR | Status: AC
  Filled 2016-07-04: qty 2

## 2016-07-04 MED ORDER — MORPHINE SULFATE (PF) 2 MG/ML IV SOLN
1.0000 mg | INTRAVENOUS | Status: DC | PRN
  Administered 2016-07-04: 2 mg via INTRAVENOUS
  Administered 2016-07-05 (×2): 4 mg via INTRAVENOUS
  Administered 2016-07-05 – 2016-07-07 (×8): 2 mg via INTRAVENOUS
  Filled 2016-07-04 (×4): qty 1
  Filled 2016-07-04: qty 2
  Filled 2016-07-04 (×4): qty 1
  Filled 2016-07-04: qty 2
  Filled 2016-07-04 (×2): qty 1

## 2016-07-04 MED ORDER — MIDAZOLAM HCL 2 MG/2ML IJ SOLN
INTRAMUSCULAR | Status: AC
  Filled 2016-07-04: qty 2

## 2016-07-04 MED ORDER — 0.9 % SODIUM CHLORIDE (POUR BTL) OPTIME
TOPICAL | Status: DC | PRN
  Administered 2016-07-04: 1000 mL

## 2016-07-04 MED ORDER — SODIUM CHLORIDE 0.9 % IV SOLN: 250.0000 mL | INTRAVENOUS | Status: DC

## 2016-07-04 MED ORDER — DIPHENHYDRAMINE HCL 50 MG/ML IJ SOLN
INTRAMUSCULAR | Status: DC | PRN
  Administered 2016-07-04: 10 mg via INTRAVENOUS

## 2016-07-04 MED ORDER — ACYCLOVIR 400 MG PO TABS: 400.0000 mg | ORAL_TABLET | Freq: Every day | ORAL | Status: DC

## 2016-07-04 MED ORDER — ONDANSETRON HCL 4 MG/2ML IJ SOLN: 4.0000 mg | INTRAMUSCULAR | Status: DC | PRN

## 2016-07-04 MED ORDER — CEFAZOLIN SODIUM 1 G IJ SOLR
INTRAMUSCULAR | Status: DC | PRN
  Administered 2016-07-04: 2 g via INTRAMUSCULAR

## 2016-07-04 MED ORDER — ASPIRIN EC 81 MG PO TBEC
81.0000 mg | DELAYED_RELEASE_TABLET | Freq: Every day | ORAL | Status: DC
  Administered 2016-07-05 – 2016-07-10 (×6): 81 mg via ORAL
  Filled 2016-07-04 (×6): qty 1

## 2016-07-04 MED ORDER — DEXAMETHASONE 4 MG PO TABS
4.0000 mg | ORAL_TABLET | Freq: Four times a day (QID) | ORAL | Status: DC
Start: 2016-07-05 — End: 2016-07-10
  Administered 2016-07-05 – 2016-07-10 (×17): 4 mg via ORAL
  Filled 2016-07-04 (×18): qty 1

## 2016-07-04 MED ORDER — ONDANSETRON HCL 4 MG/2ML IJ SOLN
INTRAMUSCULAR | Status: DC | PRN
  Administered 2016-07-04: 4 mg via INTRAVENOUS

## 2016-07-04 MED ORDER — SUFENTANIL CITRATE 50 MCG/ML IV SOLN
INTRAVENOUS | Status: AC
  Filled 2016-07-04: qty 1

## 2016-07-04 MED ORDER — SUFENTANIL CITRATE 50 MCG/ML IV SOLN
INTRAVENOUS | Status: DC | PRN
  Administered 2016-07-04 (×2): 10 ug via INTRAVENOUS
  Administered 2016-07-04: 20 ug via INTRAVENOUS
  Administered 2016-07-04: 10 ug via INTRAVENOUS

## 2016-07-04 MED ORDER — GABAPENTIN 100 MG PO CAPS
200.0000 mg | ORAL_CAPSULE | Freq: Three times a day (TID) | ORAL | Status: DC
  Administered 2016-07-05 – 2016-07-10 (×15): 200 mg via ORAL
  Filled 2016-07-04 (×17): qty 2

## 2016-07-04 MED ORDER — ONDANSETRON HCL 4 MG/2ML IJ SOLN: 4.0000 mg | Freq: Once | INTRAMUSCULAR | Status: DC | PRN

## 2016-07-04 MED ORDER — ZOLPIDEM TARTRATE 5 MG PO TABS: 5.0000 mg | ORAL_TABLET | Freq: Every evening | ORAL | Status: DC | PRN

## 2016-07-04 MED ORDER — SODIUM CHLORIDE 0.9 % IV SOLN
INTRAVENOUS | Status: DC
  Administered 2016-07-04: 22:00:00 via INTRAVENOUS

## 2016-07-04 MED ORDER — THROMBIN 5000 UNITS EX SOLR
CUTANEOUS | Status: DC | PRN
  Administered 2016-07-04 (×2): 5000 [IU] via TOPICAL

## 2016-07-04 MED ORDER — OXYCODONE-ACETAMINOPHEN 5-325 MG PO TABS
1.0000 | ORAL_TABLET | ORAL | Status: DC | PRN
  Administered 2016-07-05 – 2016-07-09 (×7): 2 via ORAL
  Filled 2016-07-04 (×7): qty 2

## 2016-07-04 MED ORDER — DEXAMETHASONE SODIUM PHOSPHATE 4 MG/ML IJ SOLN
4.0000 mg | Freq: Four times a day (QID) | INTRAMUSCULAR | Status: DC
  Administered 2016-07-05 – 2016-07-08 (×5): 4 mg via INTRAVENOUS
  Filled 2016-07-04 (×8): qty 1

## 2016-07-04 MED ORDER — MENTHOL 3 MG MT LOZG
1.0000 | LOZENGE | OROMUCOSAL | Status: DC | PRN
  Filled 2016-07-04: qty 9

## 2016-07-04 MED ORDER — ACETAMINOPHEN 650 MG RE SUPP: 650.0000 mg | RECTAL | Status: DC | PRN

## 2016-07-04 MED ORDER — LIDOCAINE 2% (20 MG/ML) 5 ML SYRINGE
INTRAMUSCULAR | Status: DC | PRN
  Administered 2016-07-04: 50 mg via INTRAVENOUS

## 2016-07-04 MED ORDER — DEXAMETHASONE SODIUM PHOSPHATE 10 MG/ML IJ SOLN
INTRAMUSCULAR | Status: DC | PRN
  Administered 2016-07-04: 10 mg via INTRAVENOUS

## 2016-07-04 MED ORDER — ESCITALOPRAM OXALATE 10 MG PO TABS
10.0000 mg | ORAL_TABLET | Freq: Every day | ORAL | Status: DC
  Administered 2016-07-05 – 2016-07-10 (×6): 10 mg via ORAL
  Filled 2016-07-04 (×6): qty 1

## 2016-07-04 MED ORDER — SODIUM CHLORIDE 0.9% FLUSH
3.0000 mL | Freq: Two times a day (BID) | INTRAVENOUS | Status: DC
  Administered 2016-07-04 – 2016-07-10 (×8): 3 mL via INTRAVENOUS

## 2016-07-04 MED ORDER — ROCURONIUM BROMIDE 10 MG/ML (PF) SYRINGE
PREFILLED_SYRINGE | INTRAVENOUS | Status: AC
  Filled 2016-07-04: qty 10

## 2016-07-04 MED ORDER — LACTATED RINGERS IV SOLN
INTRAVENOUS | Status: DC
  Administered 2016-07-04 (×2): via INTRAVENOUS

## 2016-07-04 MED ORDER — CEFAZOLIN IN D5W 1 GM/50ML IV SOLN
1.0000 g | Freq: Three times a day (TID) | INTRAVENOUS | Status: AC
  Administered 2016-07-05 (×2): 1 g via INTRAVENOUS
  Filled 2016-07-04 (×2): qty 50

## 2016-07-04 MED ORDER — ACYCLOVIR 200 MG PO CAPS
400.0000 mg | ORAL_CAPSULE | Freq: Every day | ORAL | Status: DC
  Administered 2016-07-05 – 2016-07-10 (×6): 400 mg via ORAL
  Filled 2016-07-04 (×6): qty 2

## 2016-07-04 SURGICAL SUPPLY — 58 items
APL SKNCLS STERI-STRIP NONHPOA (GAUZE/BANDAGES/DRESSINGS) ×1
BENZOIN TINCTURE PRP APPL 2/3 (GAUZE/BANDAGES/DRESSINGS) ×3 IMPLANT
BLADE ULTRA TIP 2M (BLADE) ×3 IMPLANT
BNDG GAUZE ELAST 4 BULKY (GAUZE/BANDAGES/DRESSINGS) ×6 IMPLANT
BUR BARREL STRAIGHT FLUTE 4.0 (BURR) IMPLANT
BUR MATCHSTICK NEURO 3.0 LAGG (BURR) ×3 IMPLANT
CANISTER SUCT 3000ML PPV (MISCELLANEOUS) ×3 IMPLANT
CARTRIDGE OIL MAESTRO DRILL (MISCELLANEOUS) ×1 IMPLANT
CLOSURE WOUND 1/2 X4 (GAUZE/BANDAGES/DRESSINGS) ×1
COVER MAYO STAND STRL (DRAPES) ×3 IMPLANT
DIFFUSER DRILL AIR PNEUMATIC (MISCELLANEOUS) ×3 IMPLANT
DRAPE C-ARM 42X72 X-RAY (DRAPES) ×6 IMPLANT
DRAPE LAPAROTOMY 100X72 PEDS (DRAPES) ×3 IMPLANT
DRAPE MICROSCOPE LEICA (MISCELLANEOUS) ×3 IMPLANT
DRAPE POUCH INSTRU U-SHP 10X18 (DRAPES) ×3 IMPLANT
DRSG OPSITE POSTOP 3X4 (GAUZE/BANDAGES/DRESSINGS) ×2 IMPLANT
DURAPREP 6ML APPLICATOR 50/CS (WOUND CARE) ×3 IMPLANT
ELECT REM PT RETURN 9FT ADLT (ELECTROSURGICAL) ×3
ELECTRODE REM PT RTRN 9FT ADLT (ELECTROSURGICAL) ×1 IMPLANT
GAUZE SPONGE 4X4 12PLY STRL (GAUZE/BANDAGES/DRESSINGS) ×3 IMPLANT
GAUZE SPONGE 4X4 16PLY XRAY LF (GAUZE/BANDAGES/DRESSINGS) IMPLANT
GLOVE BIO SURGEON STRL SZ 6.5 (GLOVE) ×2 IMPLANT
GLOVE BIO SURGEON STRL SZ8 (GLOVE) ×2 IMPLANT
GLOVE BIO SURGEONS STRL SZ 6.5 (GLOVE) ×2
GLOVE BIOGEL M 8.0 STRL (GLOVE) ×3 IMPLANT
GLOVE BIOGEL PI IND STRL 6.5 (GLOVE) IMPLANT
GLOVE BIOGEL PI INDICATOR 6.5 (GLOVE) ×2
GLOVE EXAM NITRILE LRG STRL (GLOVE) IMPLANT
GLOVE EXAM NITRILE XL STR (GLOVE) IMPLANT
GLOVE EXAM NITRILE XS STR PU (GLOVE) IMPLANT
GOWN STRL REUS W/ TWL LRG LVL3 (GOWN DISPOSABLE) ×1 IMPLANT
GOWN STRL REUS W/ TWL XL LVL3 (GOWN DISPOSABLE) IMPLANT
GOWN STRL REUS W/TWL 2XL LVL3 (GOWN DISPOSABLE) IMPLANT
GOWN STRL REUS W/TWL LRG LVL3 (GOWN DISPOSABLE) ×6
GOWN STRL REUS W/TWL XL LVL3 (GOWN DISPOSABLE)
HALTER HD/CHIN CERV TRACTION D (MISCELLANEOUS) ×1 IMPLANT
HEMOSTAT POWDER KIT SURGIFOAM (HEMOSTASIS) ×2 IMPLANT
KIT BASIN OR (CUSTOM PROCEDURE TRAY) ×3 IMPLANT
KIT ROOM TURNOVER OR (KITS) ×3 IMPLANT
NDL SPNL 22GX3.5 QUINCKE BK (NEEDLE) ×1 IMPLANT
NEEDLE SPNL 22GX3.5 QUINCKE BK (NEEDLE) ×3 IMPLANT
NS IRRIG 1000ML POUR BTL (IV SOLUTION) ×3 IMPLANT
OIL CARTRIDGE MAESTRO DRILL (MISCELLANEOUS) ×3
PACK LAMINECTOMY NEURO (CUSTOM PROCEDURE TRAY) ×3 IMPLANT
PATTIES SURGICAL .5 X1 (DISPOSABLE) ×1 IMPLANT
RASP 3.0MM (RASP) ×2 IMPLANT
RUBBERBAND STERILE (MISCELLANEOUS) ×6 IMPLANT
SPACER CERVICAL FRGE 12X14X6-7 (Spacer) ×4 IMPLANT
SPONGE INTESTINAL PEANUT (DISPOSABLE) ×5 IMPLANT
SPONGE SURGIFOAM ABS GEL SZ50 (HEMOSTASIS) ×3 IMPLANT
STRIP CLOSURE SKIN 1/2X4 (GAUZE/BANDAGES/DRESSINGS) ×2 IMPLANT
STRIP SURGICAL 1/2 X 6 IN (GAUZE/BANDAGES/DRESSINGS) ×2 IMPLANT
SUT VIC AB 0 CT1 27 (SUTURE)
SUT VIC AB 0 CT1 27XBRD ANBCTR (SUTURE) ×1 IMPLANT
SUT VIC AB 3-0 SH 8-18 (SUTURE) ×6 IMPLANT
TOWEL OR 17X24 6PK STRL BLUE (TOWEL DISPOSABLE) ×3 IMPLANT
TOWEL OR 17X26 10 PK STRL BLUE (TOWEL DISPOSABLE) ×3 IMPLANT
WATER STERILE IRR 1000ML POUR (IV SOLUTION) ×3 IMPLANT

## 2016-07-04 NOTE — Anesthesia Procedure Notes (Signed)
Procedure Name: Intubation Date/Time: 07/04/2016 4:16 PM Performed by: Melina Copa, Gilda Abboud R Pre-anesthesia Checklist: Patient identified, Emergency Drugs available, Suction available and Patient being monitored Patient Re-evaluated:Patient Re-evaluated prior to inductionOxygen Delivery Method: Circle System Utilized Preoxygenation: Pre-oxygenation with 100% oxygen Intubation Type: IV induction Ventilation: Mask ventilation with difficulty and Oral airway inserted - appropriate to patient size Laryngoscope Size: Glidescope and 3 Grade View: Grade II Tube type: Oral Tube size: 6.5 mm Number of attempts: 1 Airway Equipment and Method: Rigid stylet,  Video-laryngoscopy and Bougie stylet (Larynx very anterior, bougie stylet advanced through cords and 7.0 ETT advanced, cuff torn, bougie reinserted and 6.5 ETT inserted using Mac 3 blade.) Placement Confirmation: ETT inserted through vocal cords under direct vision,  positive ETCO2 and breath sounds checked- equal and bilateral Secured at: 21 cm Tube secured with: Tape Dental Injury: Teeth and Oropharynx as per pre-operative assessment  Difficulty Due To: Difficulty was anticipated, Difficult Airway- due to reduced neck mobility, Difficult Airway- due to anterior larynx and Difficult Airway- due to limited oral opening Future Recommendations: Recommend- induction with short-acting agent, and alternative techniques readily available

## 2016-07-04 NOTE — Anesthesia Preprocedure Evaluation (Addendum)
Anesthesia Evaluation  Patient identified by MRN, date of birth, ID band Patient awake    Reviewed: Allergy & Precautions, NPO status , Patient's Chart, lab work & pertinent test results  Airway Mallampati: IV  TM Distance: <3 FB Neck ROM: Limited  Mouth opening: Limited Mouth Opening  Dental  (+) Teeth Intact, Dental Advisory Given   Pulmonary    breath sounds clear to auscultation       Cardiovascular hypertension,  Rhythm:Regular Rate:Normal     Neuro/Psych    GI/Hepatic   Endo/Other    Renal/GU      Musculoskeletal   Abdominal   Peds  Hematology   Anesthesia Other Findings   Reproductive/Obstetrics                            Anesthesia Physical Anesthesia Plan  ASA: III  Anesthesia Plan: General   Post-op Pain Management:    Induction: Intravenous  Airway Management Planned: Oral ETT  Additional Equipment:   Intra-op Plan:   Post-operative Plan: Extubation in OR  Informed Consent: I have reviewed the patients History and Physical, chart, labs and discussed the procedure including the risks, benefits and alternatives for the proposed anesthesia with the patient or authorized representative who has indicated his/her understanding and acceptance.   Dental advisory given  Plan Discussed with: CRNA and Anesthesiologist  Anesthesia Plan Comments:         Anesthesia Quick Evaluation

## 2016-07-04 NOTE — Transfer of Care (Signed)
Immediate Anesthesia Transfer of Care Note  Patient: Kimberly [name]  Procedure(s) Performed: Procedure(s): CERVICAL FOUR-FIVE, CERVICAL FIVE-SIX ANTERIOR CERVICAL DECOMPRESSION/DISCECTOMY/FUSION WITH REVISION OF CERVICAL THREE-FOUR PLATE (N/A)  Patient Location: PACU  Anesthesia Type:General  Level of Consciousness: oriented, sedated, patient cooperative and responds to stimulation  Airway & Oxygen Therapy: Patient Spontanous Breathing and Patient connected to face mask oxygen  Post-op Assessment: Report given to RN, Post -op Vital signs reviewed and stable and Patient moving all extremities X 4  Post vital signs: Reviewed and stable  Last Vitals:  Vitals:   07/04/16 1058  BP: 119/82  Pulse: 100  Resp: 20  Temp: 36.7 C    Last Pain:  Vitals:   07/04/16 1058  TempSrc: Oral      Patients Stated Pain Goal: 3 ([id number])  Complications: No apparent anesthesia complications

## 2016-07-04 NOTE — Anesthesia Postprocedure Evaluation (Signed)
Anesthesia Post Note  Patient: Kimberly [name]  Procedure(s) Performed: Procedure(s) (LRB): CERVICAL FOUR-FIVE, CERVICAL FIVE-SIX ANTERIOR CERVICAL DECOMPRESSION/DISCECTOMY/FUSION WITH REVISION OF CERVICAL THREE-FOUR PLATE (N/A)  Patient location during evaluation: PACU Anesthesia Type: General Level of consciousness: awake and alert, patient cooperative and oriented Pain management: pain level controlled Vital Signs Assessment: post-procedure vital signs reviewed and stable Respiratory status: spontaneous breathing, nonlabored ventilation, respiratory function stable and patient connected to nasal cannula oxygen Cardiovascular status: blood pressure returned to baseline and stable Postop Assessment: no signs of nausea or vomiting Anesthetic complications: no    Last Vitals:  Vitals:   07/04/16 2050 07/04/16 2059  BP: 116/81 116/77  Pulse: (!) 104 (!) 102  Resp: (!) 24   Temp:  36.1 C    Last Pain:  Vitals:   07/04/16 2059  TempSrc:   PainSc: Asleep                 Lossie Kalp,E. Jennfier Abdulla

## 2016-07-05 ENCOUNTER — Encounter (HOSPITAL_COMMUNITY): Payer: Self-pay | Admitting: Neurosurgery

## 2016-07-05 ENCOUNTER — Inpatient Hospital Stay (HOSPITAL_COMMUNITY): Payer: BLUE CROSS/BLUE SHIELD

## 2016-07-05 ENCOUNTER — Other Ambulatory Visit: Payer: Self-pay

## 2016-07-05 DIAGNOSIS — J45909 Unspecified asthma, uncomplicated: Secondary | ICD-10-CM | POA: Diagnosis present

## 2016-07-05 DIAGNOSIS — R0789 Other chest pain: Secondary | ICD-10-CM | POA: Diagnosis present

## 2016-07-05 DIAGNOSIS — J9383 Other pneumothorax: Secondary | ICD-10-CM

## 2016-07-05 DIAGNOSIS — K219 Gastro-esophageal reflux disease without esophagitis: Secondary | ICD-10-CM | POA: Diagnosis present

## 2016-07-05 DIAGNOSIS — J930 Spontaneous tension pneumothorax: Secondary | ICD-10-CM

## 2016-07-05 DIAGNOSIS — J9601 Acute respiratory failure with hypoxia: Secondary | ICD-10-CM

## 2016-07-05 LAB — SURGICAL PCR SCREEN
MRSA, PCR: NEGATIVE
STAPHYLOCOCCUS AUREUS: NEGATIVE

## 2016-07-05 LAB — TROPONIN I: Troponin I: <0.03 ng/mL (ref <0.03)

## 2016-07-05 IMAGING — RF DG MYELOGRAPHY LUMBAR INJ MULTI REGION
12 of 24 series · 12 of 24 positions shown · non-contrast
Comparison: Cervical spine MRI 12/25/2013

CLINICAL DATA: Lumbar and cervical radiculopathy. Neck pain
radiating into both arms with bilateral hand weakness. Low back pain
radiating into both lower extremities to the feet.
TECHNIQUE: Contiguous axial images were obtained through the Cervical and
Lumbar spine after the intrathecal infusion of contrast. Coronal and
sagittal reconstructions were obtained of the axial image sets.

[Series 2: myelogram  white · 1 of 1 slices shown (1 of 10)]
[im 1/1]
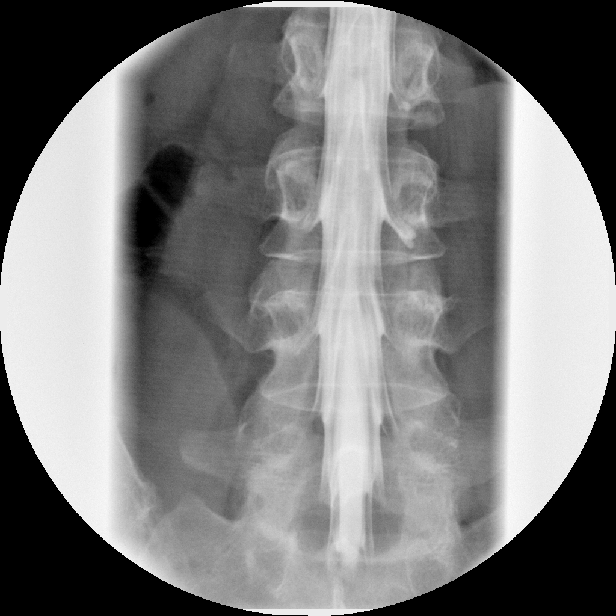

[Series 4: myelogram  white · 1 of 1 slices shown (2 of 10)]
[im 1/1]
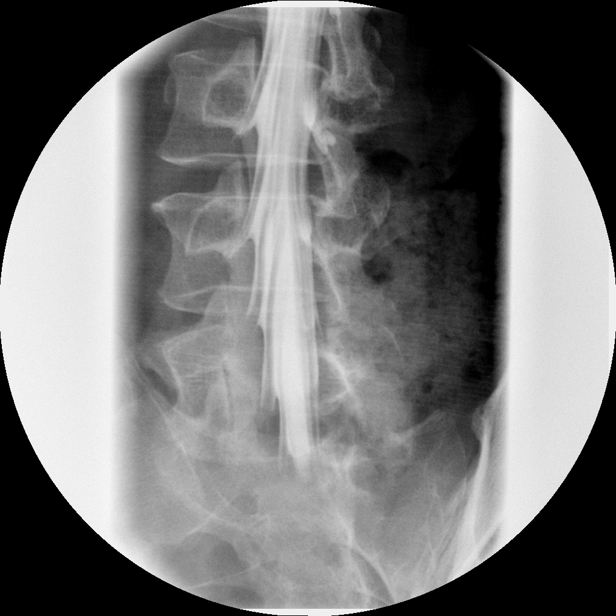

[Series 6: myelogram  white · 1 of 1 slices shown (3 of 10)]
[im 1/1]
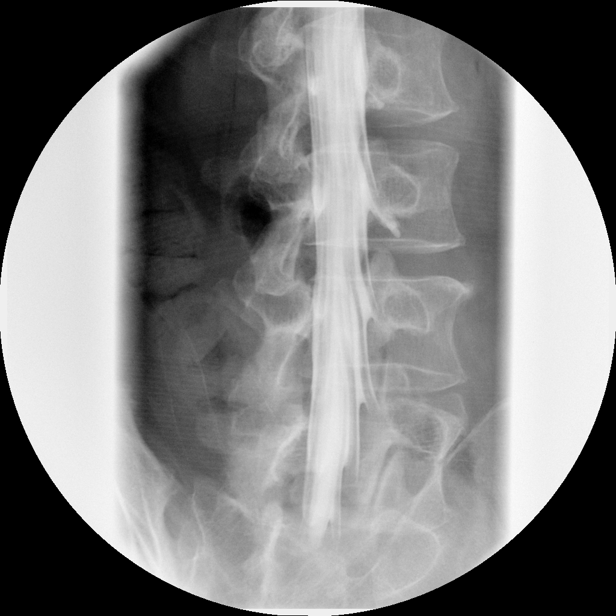

[Series 8: myelogram  white · 1 of 1 slices shown (4 of 10)]
[im 1/1]
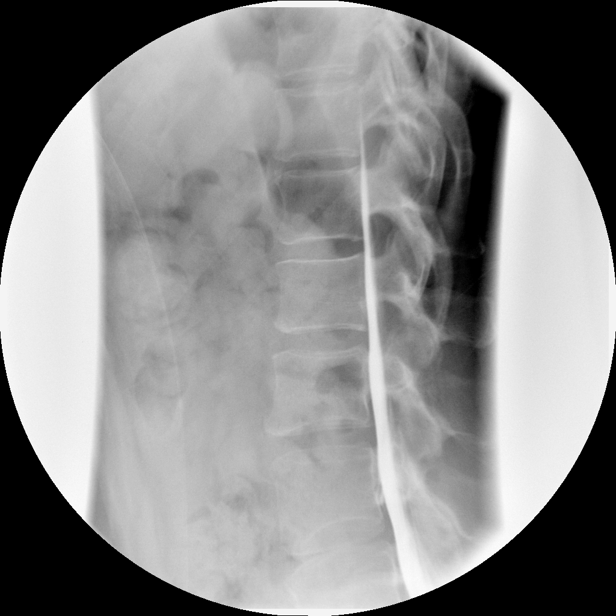

[Series 10: myelogram  white · 1 of 1 slices shown (5 of 10)]
[im 1/1]
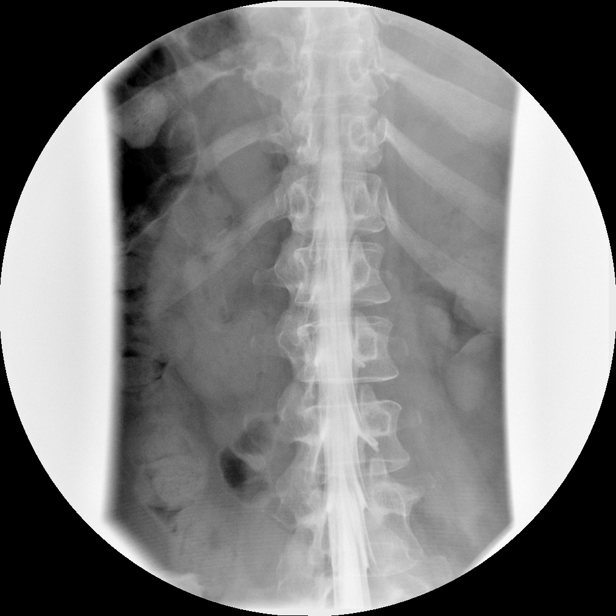

[Series 12: myelogram  white · 1 of 1 slices shown (6 of 10)]
[im 1/1]
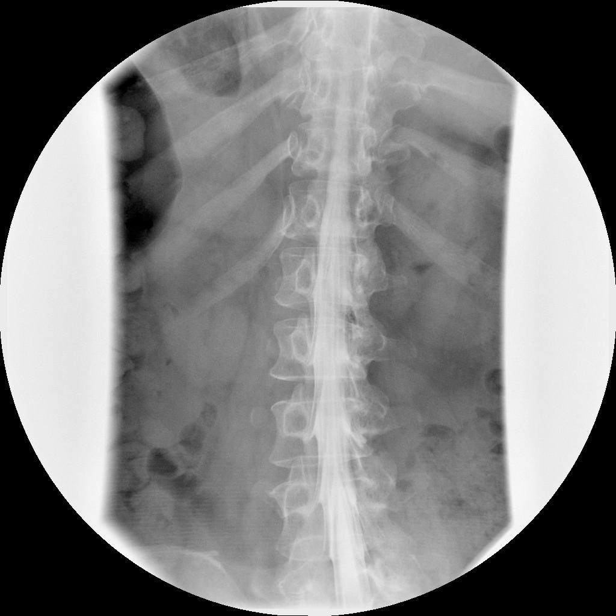

[Series 14: myelogram  white · 1 of 1 slices shown (7 of 10)]
[im 1/1]
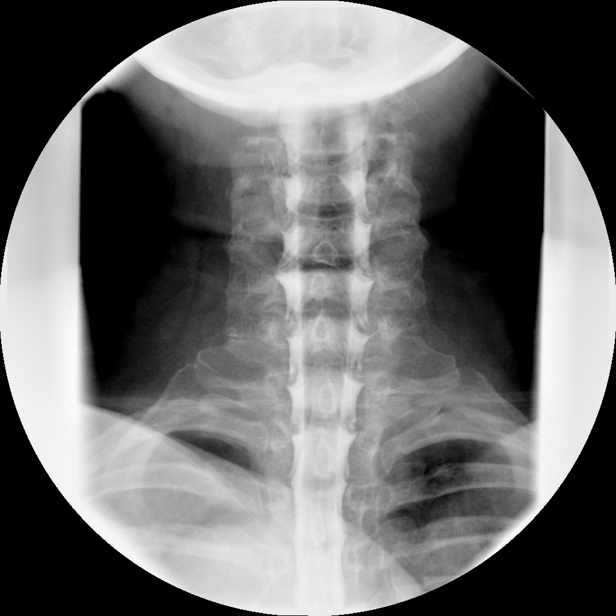

[Series 16: myelogram  white · 1 of 1 slices shown (8 of 10)]
[im 1/1]
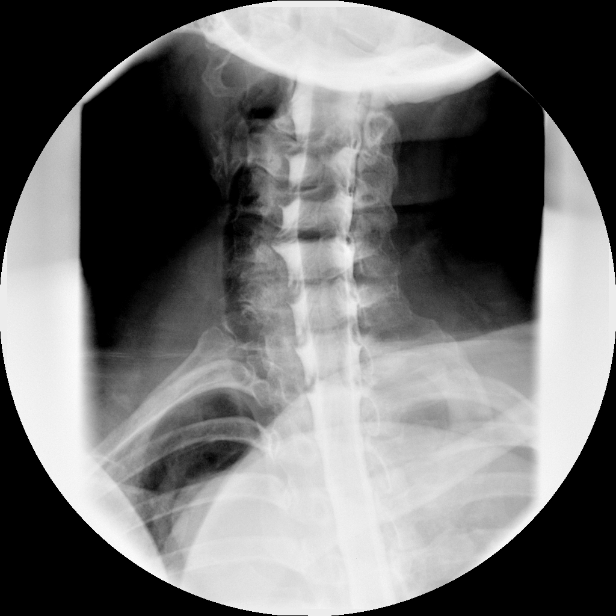

[Series 18: myelogram  white · 1 of 1 slices shown (9 of 10)]
[im 1/1]
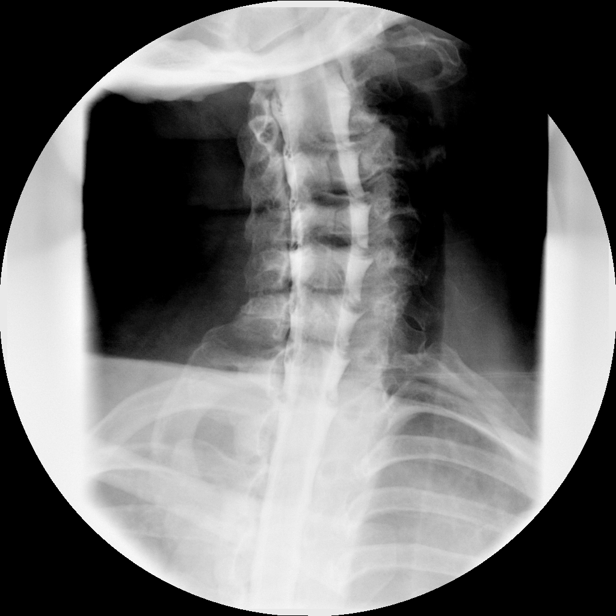

[Series 20: myelogram  white · 1 of 1 slices shown (10 of 10)]
[im 1/1]
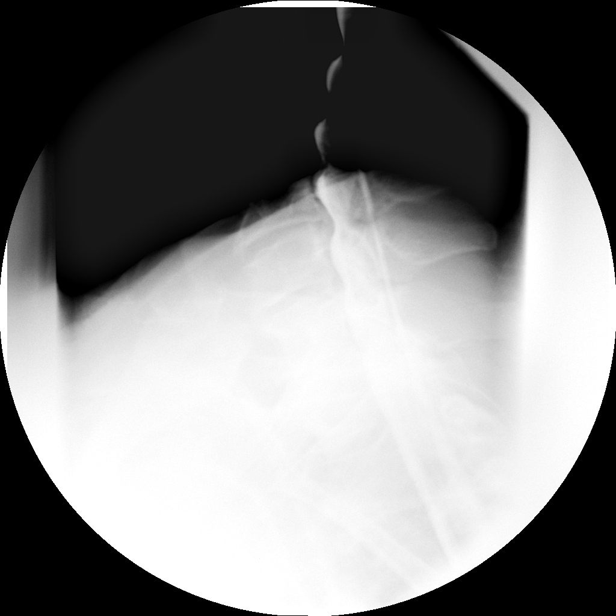

[Series 1001: view not recorded · 0.15mm/px · 1 of 1 slices shown (1 of 2)]
[im 1/1]
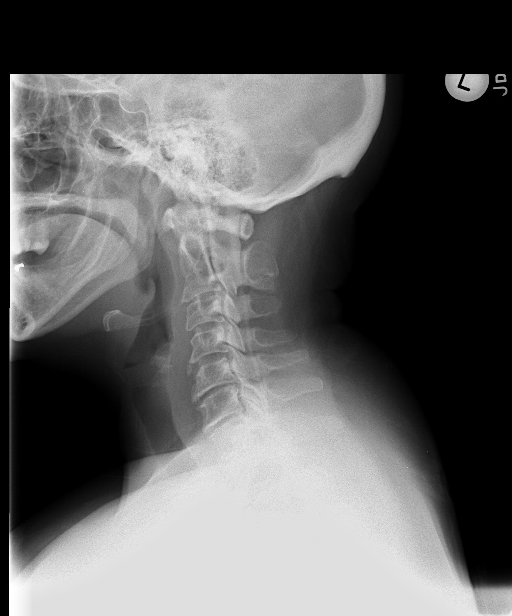

[Series 1003: view not recorded · 0.15mm/px · 1 of 1 slices shown (2 of 2)]
[im 1/1]
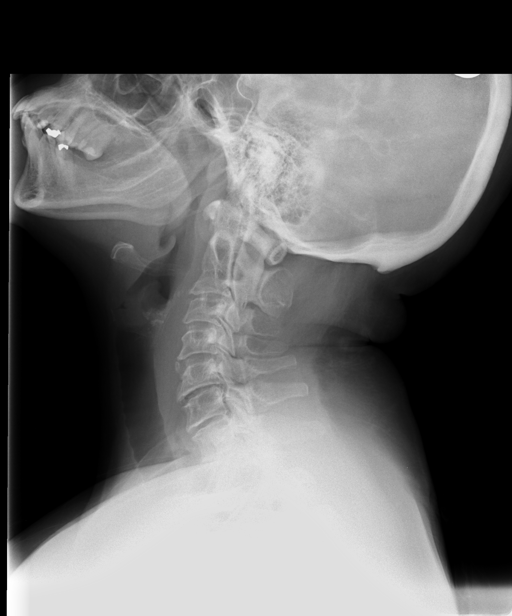

[12 of 24 positions shown; findings below may reference images not displayed]

FLUOROSCOPY TIME:  0 min 51 seconds

PROCEDURE:
LUMBAR PUNCTURE FOR CERVICAL AND LUMBAR MYELOGRAM

CERVICAL AND LUMBAR MYELOGRAM

CT CERVICAL MYELOGRAM

CT LUMBAR MYELOGRAM

After thorough discussion of risks and benefits of the procedure
including bleeding, infection, injury to nerves, blood vessels,
adjacent structures as well as headache and CSF leak, written and
oral informed consent was obtained. Consent was obtained by Dr.
Klever Javier Maryo.

Patient was positioned prone on the fluoroscopy table. Local
anesthesia was provided with 1% lidocaine without epinephrine after
prepped and draped in the usual sterile fashion. Puncture was
performed at L4-5 using a 3 1/2 inch 22-gauge spinal needle via a
right paramedian approach. Using a single pass through the dura, the
needle was placed within the thecal sac, with return of clear CSF.
10 mL of Xmnipaque-1GG was injected into the thecal sac, with normal
opacification of the nerve roots and cauda equina consistent with
free flow within the subarachnoid space. The patient was then moved
to the Trendelenburg position and contrast flowed into the Cervical
spine region.

I personally performed the lumbar puncture and administered the
intrathecal contrast. I also personally supervised acquisition of
the myelogram images.
FINDINGS: CERVICAL AND LUMBAR MYELOGRAM FINDINGS:

Congenital fusion is again noted at C2-3 involving the vertebral
bodies and posterior elements. There is straightening of the normal
cervical lordosis. There there is at most trace retrolisthesis of C4
on C5 and C5 on C6 which reduces with flexion and does not
significantly change with extension. Moderate multilevel disc space
narrowing is seen in the lower cervical spine with associated
endplate osteophyte formation. Small to moderate-sized ventral
epidural defects are present at each level throughout the cervical
spine.

Lumbar vertebral alignment is normal in neutral, flexion, and
extension positioning. Lumbar vertebral body heights are preserved.
Lumbar intervertebral disc space heights are relatively well
preserved. Small ventral epidural defects are noted from L2-3 to
L5-S1.

CT CERVICAL MYELOGRAM FINDINGS:

There is straightening of the normal cervical lordosis without
significant listhesis. Congenital C2-3 fusion is noted. Moderate
disc space narrowing is present at C3-4, C6-7, and C7-T1. Endplate
osteophyte formation is present throughout the cervical spine.
Cervical spinal cord is normal in caliber. Paraspinal soft tissues
are unremarkable.

C2-3: Congenital vertebral body and posterior element fusion without
stenosis.

C3-4: Broad-based posterior disc osteophyte complex results in
moderate spinal stenosis and mild-to-moderate bilateral neural
foraminal stenosis. There is mild flattening of the ventral spinal
cord.

C4-5: Broad-based posterior disc osteophyte complex results in mild
spinal stenosis and mild right and mild-to-moderate left neural
foraminal stenosis.

C5-6: Broad-based posterior disc osteophyte complex results in mild
spinal stenosis. No neural foraminal stenosis.

C6-7: Broad-based posterior disc osteophyte complex results in
mild-to-moderate spinal stenosis. Prominent bilateral uncovertebral
arthrosis results in moderate right and severe left neural foraminal
stenosis.

C7-T1: Shallow posterior disc osteophyte complex without spinal
stenosis. Uncovertebral arthrosis results in moderate bilateral
neural foraminal stenosis.

CT LUMBAR MYELOGRAM FINDINGS:

The lowest well-formed intervertebral disc space is labeled L5-S1.
There is minimal left convex curvature of the lumbar spine. There is
no listhesis. Vertebral body heights and intervertebral disc space
heights are preserved. Conus medullaris terminates at L1-2.
Paraspinal soft tissues are unremarkable.

L1-2:  Minimal disc bulge without stenosis.

L2-3:  Mild disc bulge without stenosis.

L3-4: Mild disc bulge asymmetric to the right and moderate facet and
ligamentum flavum hypertrophy result in mild spinal stenosis and
mild right neural foraminal stenosis.

L4-5: Mild disc bulge asymmetric to the right and mild to moderate
facet and ligamentum flavum hypertrophy result in mild bilateral
lateral recess narrowing and mild right neural foraminal narrowing.

L5-S1: Broad-based right central/subarticular disc protrusion
narrows the right lateral recess and contacts both S1 nerve roots,
mildly displacing the S1 nerve root on the right. There is moderate
right and mild left facet arthrosis which, in combination with the
disc protrusion, results in mild right greater than left neural
foraminal narrowing. No spinal stenosis.
IMPRESSION: 1. Moderate multilevel cervical spondylosis. Spinal stenosis is
greatest at C3-4, where it is moderate. Neural foraminal stenosis is
greatest at C6-7, where it is moderate on the right and severe on
the left.
2. Mild disc bulging and moderate facet arthrosis in the mid and
lower lumbar spine, with a disc protrusion at L5-S1 potentially
affecting both S1 nerve roots, right greater than left.

## 2016-07-05 IMAGING — CT CT L SPINE W/ CM
2 of 15 series · 8 of 33 positions shown, 10 images · non-contrast
Comparison: Cervical spine MRI 12/25/2013

CLINICAL DATA: Lumbar and cervical radiculopathy. Neck pain
radiating into both arms with bilateral hand weakness. Low back pain
radiating into both lower extremities to the feet.
TECHNIQUE: Contiguous axial images were obtained through the Cervical and
Lumbar spine after the intrathecal infusion of contrast. Coronal and
sagittal reconstructions were obtained of the axial image sets.

[Series 7: l spine soft · axial · 0.27mm/px · z∈[-291,-61]mm · 3 of 93 slices shown, 4 images]
[im 1/93  soft-tissue]
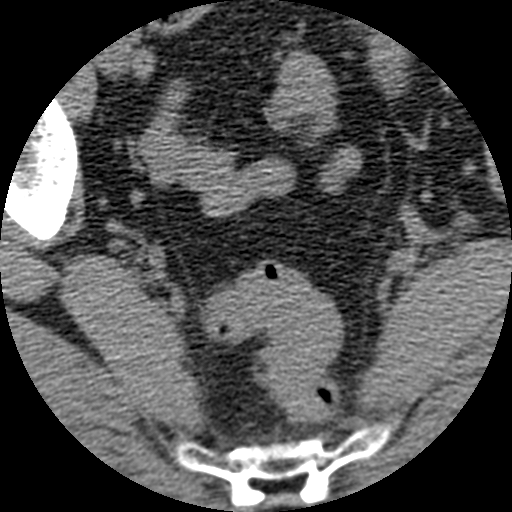
[im 1/93  bone]
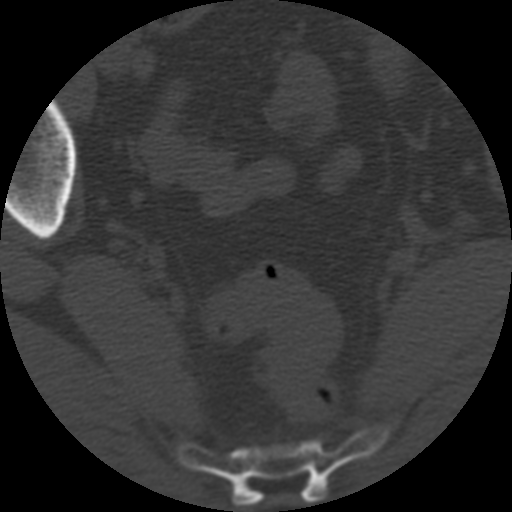
[im 47/93  bone]
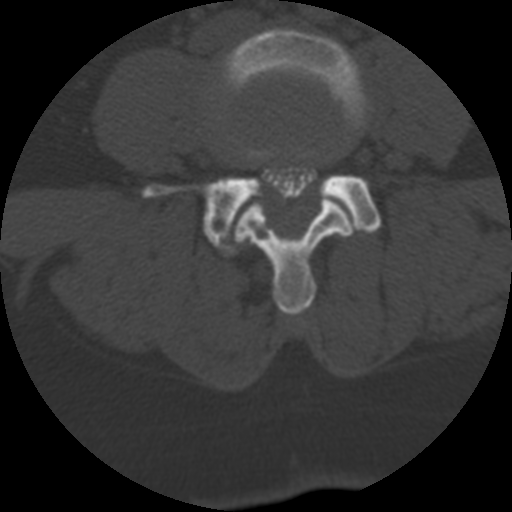
[im 93/93  bone]
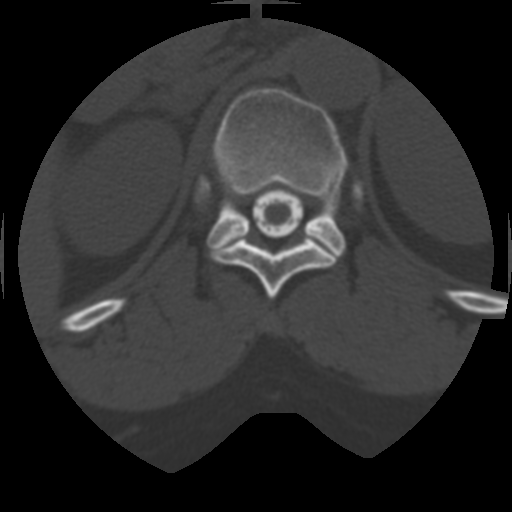

[Series 402: sag c-sp · sagittal · 0.28mm/px · 5 of 33 slices shown, 6 images]
[im 11/33  bone]
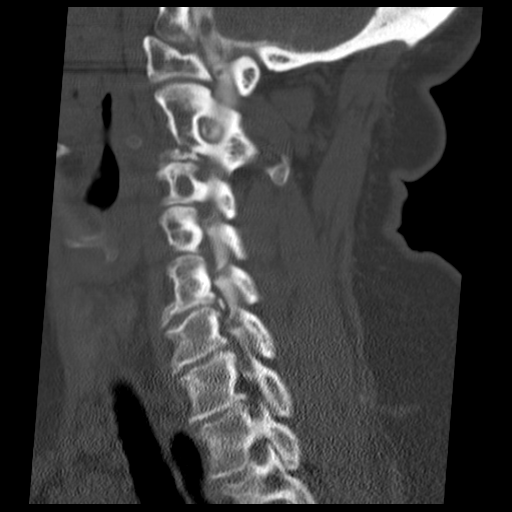
[im 14/33  bone]
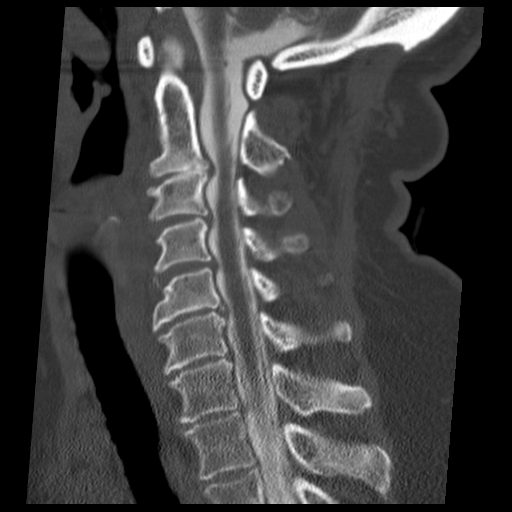
[im 17/33  soft-tissue]
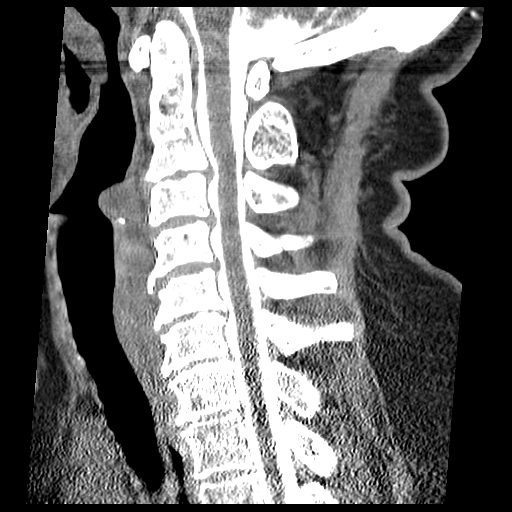
[im 17/33  bone]
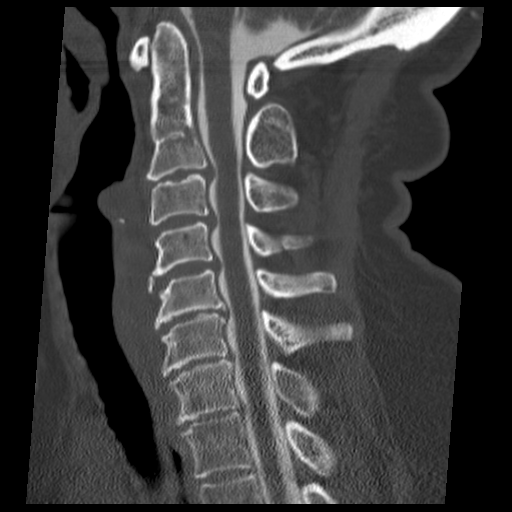
[im 19/33  bone]
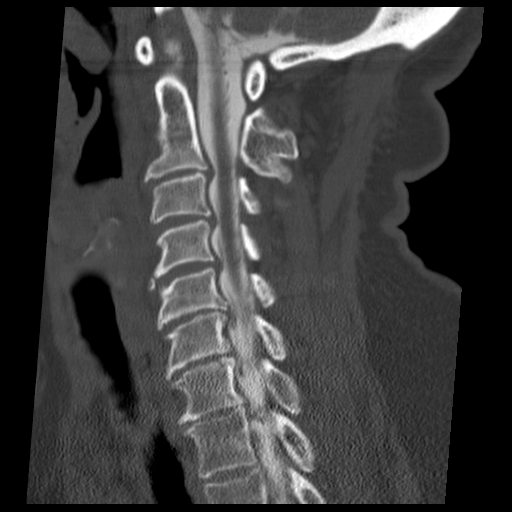
[im 22/33  bone]
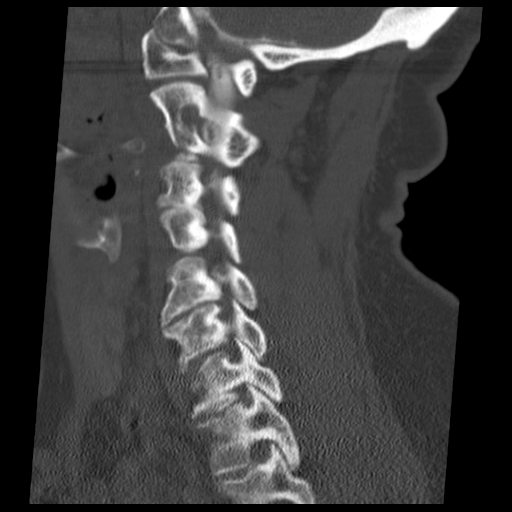

[8 of 33 positions shown; findings below may reference images not displayed]

FLUOROSCOPY TIME:  0 min 51 seconds

PROCEDURE:
LUMBAR PUNCTURE FOR CERVICAL AND LUMBAR MYELOGRAM

CERVICAL AND LUMBAR MYELOGRAM

CT CERVICAL MYELOGRAM

CT LUMBAR MYELOGRAM

After thorough discussion of risks and benefits of the procedure
including bleeding, infection, injury to nerves, blood vessels,
adjacent structures as well as headache and CSF leak, written and
oral informed consent was obtained. Consent was obtained by Dr.
Klever Javier Maryo.

Patient was positioned prone on the fluoroscopy table. Local
anesthesia was provided with 1% lidocaine without epinephrine after
prepped and draped in the usual sterile fashion. Puncture was
performed at L4-5 using a 3 1/2 inch 22-gauge spinal needle via a
right paramedian approach. Using a single pass through the dura, the
needle was placed within the thecal sac, with return of clear CSF.
10 mL of Xmnipaque-1GG was injected into the thecal sac, with normal
opacification of the nerve roots and cauda equina consistent with
free flow within the subarachnoid space. The patient was then moved
to the Trendelenburg position and contrast flowed into the Cervical
spine region.

I personally performed the lumbar puncture and administered the
intrathecal contrast. I also personally supervised acquisition of
the myelogram images.
FINDINGS: CERVICAL AND LUMBAR MYELOGRAM FINDINGS:

Congenital fusion is again noted at C2-3 involving the vertebral
bodies and posterior elements. There is straightening of the normal
cervical lordosis. There there is at most trace retrolisthesis of C4
on C5 and C5 on C6 which reduces with flexion and does not
significantly change with extension. Moderate multilevel disc space
narrowing is seen in the lower cervical spine with associated
endplate osteophyte formation. Small to moderate-sized ventral
epidural defects are present at each level throughout the cervical
spine.

Lumbar vertebral alignment is normal in neutral, flexion, and
extension positioning. Lumbar vertebral body heights are preserved.
Lumbar intervertebral disc space heights are relatively well
preserved. Small ventral epidural defects are noted from L2-3 to
L5-S1.

CT CERVICAL MYELOGRAM FINDINGS:

There is straightening of the normal cervical lordosis without
significant listhesis. Congenital C2-3 fusion is noted. Moderate
disc space narrowing is present at C3-4, C6-7, and C7-T1. Endplate
osteophyte formation is present throughout the cervical spine.
Cervical spinal cord is normal in caliber. Paraspinal soft tissues
are unremarkable.

C2-3: Congenital vertebral body and posterior element fusion without
stenosis.

C3-4: Broad-based posterior disc osteophyte complex results in
moderate spinal stenosis and mild-to-moderate bilateral neural
foraminal stenosis. There is mild flattening of the ventral spinal
cord.

C4-5: Broad-based posterior disc osteophyte complex results in mild
spinal stenosis and mild right and mild-to-moderate left neural
foraminal stenosis.

C5-6: Broad-based posterior disc osteophyte complex results in mild
spinal stenosis. No neural foraminal stenosis.

C6-7: Broad-based posterior disc osteophyte complex results in
mild-to-moderate spinal stenosis. Prominent bilateral uncovertebral
arthrosis results in moderate right and severe left neural foraminal
stenosis.

C7-T1: Shallow posterior disc osteophyte complex without spinal
stenosis. Uncovertebral arthrosis results in moderate bilateral
neural foraminal stenosis.

CT LUMBAR MYELOGRAM FINDINGS:

The lowest well-formed intervertebral disc space is labeled L5-S1.
There is minimal left convex curvature of the lumbar spine. There is
no listhesis. Vertebral body heights and intervertebral disc space
heights are preserved. Conus medullaris terminates at L1-2.
Paraspinal soft tissues are unremarkable.

L1-2:  Minimal disc bulge without stenosis.

L2-3:  Mild disc bulge without stenosis.

L3-4: Mild disc bulge asymmetric to the right and moderate facet and
ligamentum flavum hypertrophy result in mild spinal stenosis and
mild right neural foraminal stenosis.

L4-5: Mild disc bulge asymmetric to the right and mild to moderate
facet and ligamentum flavum hypertrophy result in mild bilateral
lateral recess narrowing and mild right neural foraminal narrowing.

L5-S1: Broad-based right central/subarticular disc protrusion
narrows the right lateral recess and contacts both S1 nerve roots,
mildly displacing the S1 nerve root on the right. There is moderate
right and mild left facet arthrosis which, in combination with the
disc protrusion, results in mild right greater than left neural
foraminal narrowing. No spinal stenosis.
IMPRESSION: 1. Moderate multilevel cervical spondylosis. Spinal stenosis is
greatest at C3-4, where it is moderate. Neural foraminal stenosis is
greatest at C6-7, where it is moderate on the right and severe on
the left.
2. Mild disc bulging and moderate facet arthrosis in the mid and
lower lumbar spine, with a disc protrusion at L5-S1 potentially
affecting both S1 nerve roots, right greater than left.

## 2016-07-05 MED ORDER — IPRATROPIUM-ALBUTEROL 0.5-2.5 (3) MG/3ML IN SOLN: 3.0000 mL | Freq: Four times a day (QID) | RESPIRATORY_TRACT | Status: DC

## 2016-07-05 MED ORDER — IPRATROPIUM-ALBUTEROL 0.5-2.5 (3) MG/3ML IN SOLN: 3.0000 mL | Freq: Four times a day (QID) | RESPIRATORY_TRACT | Status: DC | PRN

## 2016-07-05 MED ORDER — WHITE PETROLATUM GEL
Status: AC
  Administered 2016-07-05: 03:00:00
  Filled 2016-07-05: qty 1

## 2016-07-05 MED ORDER — CEFAZOLIN IN D5W 1 GM/50ML IV SOLN
1.0000 g | Freq: Three times a day (TID) | INTRAVENOUS | Status: DC
  Administered 2016-07-05 – 2016-07-10 (×15): 1 g via INTRAVENOUS
  Filled 2016-07-05 (×17): qty 50

## 2016-07-05 MED ORDER — LIDOCAINE HCL (PF) 1 % IJ SOLN: 0.0000 mL | Freq: Once | INTRAMUSCULAR | Status: DC | PRN

## 2016-07-05 NOTE — Progress Notes (Signed)
Pt in room sitting in chair, reports pain "under right breast". She sates that it is persistent, replies that pain medicine does not and has not made it better. Upon assessing O2-it was 83, Nasal canula applied increasing oxygen to 4lit,... O2 came up to 87%. Patient states that she feels "ok" except for her sore throat, (she does not appear to be in pain or distress, other VS are within range). Pt states that she has felt this "pain under her right breast" at home when she was mopping the floors and her husband had to help her with mopping.   MD is notified of pt's report of  "pain under her right breast" and O2 stats with interventions. MD orders STAT 2view chest x-ray.Will continue to monitor.

## 2016-07-05 NOTE — Progress Notes (Signed)
Patient ID: Kimberly [name], female   DOB: [birth year], 55 y.o.   MRN: [id number] Doing well. Some discomfort for swallowing but voice is normal. No weakness. Leaving drain till am

## 2016-07-05 NOTE — H&P (Signed)
NAME:  Kimberly [name], Kimberly [name]                   ACCOUNT NO.:  MEDICAL RECORD NO.:  [id number]  LOCATION:                                 FACILITY:  PHYSICIAN:  Leeroy Cha, M.D.   DATE OF BIRTH:  [birth year]  DATE OF ADMISSION:  07/04/2016 DATE OF DISCHARGE:                             HISTORY & PHYSICAL   HISTORY OF PRESENT ILLNESS:  Ms. Bauers is a lady who in the past underwent fusion at the level of cervical C6-C7.  Now she is coming to see me because of neck pain with radiation to both upper extremity, mostly at the base of the neck going to the shoulder and sometimes she feels that the pain goes to both upper extremities.  This lady feels that she is getting worse.  We have tried conservative treatment and she is not any better.  We went ahead with the cervical myelogram, which showed she has fusion at the level of cervical 3-4 with degenerative changes at the 4-5 and 5-6 cervical.  She feels then something needs to be done because she cannot live with this pain.  She is being admitted for surgery.  PAST MEDICAL HISTORY:  Anterior cervical diskectomy at the level of C3- 4, C6-7, and 7-1.  ALLERGIES:  She is allergic to CODEINE.  SOCIAL HISTORY:  Negative.  FAMILY HISTORY:  Unremarkable.  REVIEW OF SYSTEMS:  Positive for neck pain, some pain with walking, high cholesterol, history of high blood pressure, shortness of breath.  PHYSICAL EXAMINATION:  HEAD, EYES, EARS, NOSE, AND THROAT:  Normal. NECK:  She has an anterior scar.  She has pain with mobility of the cervical spine. CARDIOVASCULAR:  Clear. LUNGS:  Clear. ABDOMEN:  Normal. EXTREMITIES:  Normal. NEURO:  She is oriented x3.  __________.  Reflexes are 1+.  Sensation normal.  IMAGING:  Cervical spine x-ray showed that she has degenerative disk disease at the level of cervical 4-5 and 6-7 with solid fusion at the level of C7-T1.  It is possibility there is no fusion at the level of cervical 3-4.  CLINICAL  IMPRESSION:  Cervical degenerative disk disease.  RECOMMENDATIONS:  The patient will be admitted for surgery.  We are going to proceed with doing decompression and fusion at the level of cervical 4-5 and 5-6.  Most likely we are going to remove the plate of C3- C4 and make a decision about __________ the area.  She is fully aware of the risk with the surgery such as infection, CSF leak, worsening pain, paralysis, need of further surgery, which might require posterior decompression.          ______________________________ Leeroy Cha, M.D.     EB/MEDQ  D:  07/03/2016  T:  07/04/2016  Job:  [id number]

## 2016-07-05 NOTE — Evaluation (Signed)
Physical Therapy Evaluation Patient Details Name: Kimberly [name] MRN: [id number] DOB: 01-28-1961 Today's Date: 07/05/2016   History of Present Illness  CERVICAL FOUR-FIVE, CERVICAL FIVE-SIX ANTERIOR CERVICAL DECOMPRESSION/DISCECTOMY/FUSION WITH REVISION OF CERVICAL THREE-FOUR PLATE (N/A). PMH includes but not necessarily limited to, COPD.  Clinical Impression  Pt admitted with above diagnosis. Pt currently with functional limitations due to the deficits listed below (see PT Problem List). At the time of PT eval, pt able to demonstrate basic transfers with min assist for balance support and safety. Pt on 2L/min supplemental O2 when PT arrived, and sats 82-84% at rest. Supplemental O2 increased to 3L and sats improved to 86-88% at rest. Pt reported R-sided chest pain constant in nature, which increased with incentive spirometer use. Per chart review, pt has since been diagnosed with a large R spontaneous pneumothorax now s/p tube thoracostomy. Pt will benefit from skilled PT to increase their independence and safety with mobility to allow discharge to the venue listed below.       Follow Up Recommendations Home health PT;Supervision for mobility/OOB    Equipment Recommendations  Other (comment) (TBD)    Recommendations for Other Services       Precautions / Restrictions Precautions Precautions: Cervical Required Braces or Orthoses: Cervical Brace Cervical Brace: Hard collar Restrictions Weight Bearing Restrictions: No      Mobility  Bed Mobility Overal bed mobility: Needs Assistance Bed Mobility: Rolling;Sidelying to Sit Rolling: Min assist Sidelying to sit: Min assist       General bed mobility comments: HOB flat. Cues for technique. Min A possibly for comfort during rolling, for steadying assist during sidelying to EOB.  Transfers Overall transfer level: Needs assistance Equipment used: 2 person hand held assist Transfers: Stand Pivot Transfers;Sit to/from Stand Sit to  Stand: Min assist Stand pivot transfers: Min assist       General transfer comment: from EOB to recliner. Min A to steady. Pt anxious and high pain level.   Ambulation/Gait             General Gait Details: Did not progress ambulation at this time due to hypoxia and reported chest pain  Stairs            Wheelchair Mobility    Modified Rankin (Stroke Patients Only)       Balance Overall balance assessment: Needs assistance Sitting-balance support: Feet supported;Bilateral upper extremity supported Sitting balance-Leahy Scale: Good     Standing balance support: Single extremity supported;Bilateral upper extremity supported;During functional activity Standing balance-Leahy Scale: Fair Standing balance comment: HHA during transfers.                              Pertinent Vitals/Pain Pain Assessment: 0-10 Pain Score: 9  Pain Location: neck and under right side of breastbone Pain Descriptors / Indicators: Aching;Sore Pain Intervention(s): Limited activity within patient's tolerance;Monitored during session;Repositioned    Home Living Family/patient expects to be discharged to:: Private residence Living Arrangements: Spouse/significant other;Children;Other (Comment) (adult children who work outside the home) Available Help at Discharge: Family;Available PRN/intermittently;Friend(s) Type of Home: House Home Access: Level entry     Home Layout: One level Home Equipment: None      Prior Function Level of Independence: Independent               Hand Dominance   Dominant Hand: Right    Extremity/Trunk Assessment   Upper Extremity Assessment: Defer to OT evaluation  Lower Extremity Assessment: Overall WFL for tasks assessed      Cervical / Trunk Assessment: Other exceptions  Communication   Communication: No difficulties;Other (comment) (soft, raspy voice)  Cognition Arousal/Alertness: Awake/alert Behavior During  Therapy: Anxious Overall Cognitive Status: Within Functional Limits for tasks assessed                      General Comments      Exercises     Assessment/Plan    PT Assessment Patient needs continued PT services  PT Problem List Decreased strength;Decreased range of motion;Decreased balance;Decreased activity tolerance;Decreased mobility;Decreased knowledge of use of DME;Decreased knowledge of precautions;Decreased safety awareness;Pain          PT Treatment Interventions DME instruction;Gait training;Stair training;Functional mobility training;Therapeutic activities;Therapeutic exercise;Neuromuscular re-education;Patient/family education    PT Goals (Current goals can be found in the Care Plan section)  Acute Rehab PT Goals Patient Stated Goal: home when ready PT Goal Formulation: With patient Time For Goal Achievement: 07/12/16 Potential to Achieve Goals: Good    Frequency Min 5X/week   Barriers to discharge        Co-evaluation PT/OT/SLP Co-Evaluation/Treatment: Yes Reason for Co-Treatment: For patient/therapist safety PT goals addressed during session: Mobility/safety with mobility;Balance;Proper use of DME         End of Session Equipment Utilized During Treatment: Gait belt;Cervical collar Activity Tolerance: Patient limited by fatigue;Patient limited by pain;Treatment limited secondary to medical complications (Comment) (hypoxia) Patient left: in chair;with call bell/phone within reach;with chair alarm set;with nursing/sitter in room Nurse Communication: Mobility status         Time: [id number] PT Time Calculation (min) (ACUTE ONLY): 37 min   Charges:   PT Evaluation $PT Eval Moderate Complexity: 1 Procedure     PT G CodesThelma Comp 02-Aug-2016, 2:02 PM   [name], PT, DPT Acute Rehabilitation Services Pager: [phone]

## 2016-07-05 NOTE — Evaluation (Signed)
Occupational Therapy Evaluation Patient Details Name: Kimberly [name] MRN: [id number] DOB: 1961/04/17 Today's Date: 07/05/2016    History of Present Illness CERVICAL FOUR-FIVE, CERVICAL FIVE-SIX ANTERIOR CERVICAL DECOMPRESSION/DISCECTOMY/FUSION WITH REVISION OF CERVICAL THREE-FOUR PLATE (N/A). PMH includes but not necessarily limited to, COPD.   Clinical Impression   Pt admitted with the above diagnoses and presents with below problem list. Pt will benefit from continued acute OT to address the below listed deficits and maximize independence with basic ADLs prior to d/c home. PTA pt was independent with ADLs. Pt currently min A with LB ADLs and functional transfers. Of note, pt with O2 sats around 82-84 on 2L, 86-88 on 3L of supplemental O2 via [place]. Pt also reporting pain under right breastbone that has been constant since surgery. Pt noted increase in pain with use of incentive spirometer. Cough noted at times as well. Nursing notified.     inimplFollow Up Recommendations  No OT follow up;Supervision/Assistance - 24 hour    Equipment Recommendations  3 in 1 bedside commode    Recommendations for Other Services       Precautions / Restrictions Precautions Precautions: Cervical Required Braces or Orthoses: Cervical Brace Cervical Brace: Hard collar Restrictions Weight Bearing Restrictions: No      Mobility Bed Mobility Overal bed mobility: Needs Assistance Bed Mobility: Rolling;Sidelying to Sit Rolling: Min assist Sidelying to sit: Min assist       General bed mobility comments: HOB flat. Cues for technique. Min A to possibly for comfort during rolling, for steadying assist during sidelying to EOB.  Transfers Overall transfer level: Needs assistance Equipment used: 2 person hand held assist Transfers: Stand Pivot Transfers;Sit to/from Stand Sit to Stand: Min assist Stand pivot transfers: Min assist       General transfer comment: from EOB to recliner. Min A to  steady. Pt anxious and high pain level.     Balance Overall balance assessment: Needs assistance Sitting-balance support: Feet supported;Bilateral upper extremity supported Sitting balance-Leahy Scale: Good     Standing balance support: Single extremity supported;Bilateral upper extremity supported;During functional activity Standing balance-Leahy Scale: Fair Standing balance comment: HHA during transfers.                             ADL Overall ADL's : Needs assistance/impaired Eating/Feeding: Set up;Sitting   Grooming: Set up;Sitting   Upper Body Bathing: Sitting;Set up   Lower Body Bathing: Minimal assistance;Sit to/from stand   Upper Body Dressing : Set up;Sitting   Lower Body Dressing: Minimal assistance;Sit to/from stand   Toilet Transfer: Minimal Scientist, forensic Details (indicate cue type and reason): simulated with EOB>recliner Toileting- Clothing Manipulation and Hygiene: Minimal assistance;Sit to/from stand         General ADL Comments: Pt completed bed mobility and SPT to recliner. ADL educaiton given including handout. Educated on Art gallery manager.     Vision     Perception     Praxis      Pertinent Vitals/Pain Pain Assessment: 0-10 Pain Score: 9  Pain Location: neck and under right side of breastbone Pain Descriptors / Indicators: Aching;Sore Pain Intervention(s): Limited activity within patient's tolerance;Monitored during session;Repositioned;Utilized relaxation techniques     Hand Dominance     Extremity/Trunk Assessment Upper Extremity Assessment Upper Extremity Assessment: Overall WFL for tasks assessed   Lower Extremity Assessment Lower Extremity Assessment: Defer to PT evaluation       Communication Communication Communication: No difficulties;Other (comment) (soft, raspy  voice)   Cognition Arousal/Alertness: Awake/alert Behavior During Therapy: Anxious Overall Cognitive Status: Within  Functional Limits for tasks assessed                     General Comments       Exercises       Shoulder Instructions      Home Living Family/patient expects to be discharged to:: Private residence Living Arrangements: Spouse/significant other;Children;Other (Comment) (adult children who work outside the home) Available Help at Discharge: Family;Available PRN/intermittently;Friend(s) Type of Home: House Home Access: Level entry     Home Layout: One level     Bathroom Shower/Tub: Tub/shower unit         Home Equipment: None          Prior Functioning/Environment Level of Independence: Independent                 OT Problem List: Impaired balance (sitting and/or standing);Decreased knowledge of use of DME or AE;Decreased knowledge of precautions;Cardiopulmonary status limiting activity;Pain   OT Treatment/Interventions: Self-care/ADL training;DME and/or AE instruction;Therapeutic activities;Patient/family education;Balance training    OT Goals(Current goals can be found in the care plan section) Acute Rehab OT Goals Patient Stated Goal: home when ready OT Goal Formulation: With patient Time For Goal Achievement: 07/19/16 Potential to Achieve Goals: Good ADL Goals Pt Will Perform Grooming: with supervision;standing;sitting Pt Will Perform Lower Body Bathing: with min guard assist;sit to/from stand Pt Will Perform Lower Body Dressing: with min guard assist;sit to/from stand Pt Will Transfer to Toilet: with min guard assist;ambulating Pt Will Perform Toileting - Clothing Manipulation and hygiene: with min guard assist;sit to/from stand Pt Will Perform Tub/Shower Transfer: with min guard assist;ambulating;3 in 1 Additional ADL Goal #1: Pt will complete bed mobility at supervision level to prepare for OOB ADLs.   OT Frequency: Min 2X/week   Barriers to D/C:    Discussed having other people on stand by to assist as needed when family members are at work.        Co-evaluation PT/OT/SLP Co-Evaluation/Treatment: Yes Reason for Co-Treatment: For patient/therapist safety   OT goals addressed during session: ADL's and self-care      End of Session Equipment Utilized During Treatment: Gait belt;Cervical collar;Oxygen Nurse Communication: Other (comment) (O2 sats, pain under right breastbone, cough)  Activity Tolerance: Patient limited by pain;Other (comment) (O2 82-88 on 2L then 3L of O2 via [place]) Patient left: in chair;with call bell/phone within reach;with chair alarm set;with nursing/sitter in room   Time: 1020-1050 OT Time Calculation (min): 30 min Charges:  OT General Charges $OT Visit: 1 Procedure OT Evaluation $OT Eval Low Complexity: 1 Procedure G-Codes:    Hortencia Pilar 07/17/2016, 11:13 AM

## 2016-07-05 NOTE — Care Management Note (Signed)
Case Management Note  Patient Details  Name: Kimberly [name] MRN: [id number] Date of Birth: April 25, 1961  Subjective/Objective:                  Patient was admitted for an ACDF.  Lives at home with spouse. Will follow for discharge needs pending patient's progress, PT/OT evals and physician orders.   Action/Plan:   Expected Discharge Date:                  Expected Discharge Plan:     In-House Referral:     Discharge planning Services     Post Acute Care Choice:    Choice offered to:     DME Arranged:    DME Agency:     HH Arranged:    HH Agency:     Status of Service:     If discussed at [employer] of Stay Meetings, dates discussed:    Additional Comments:  Rolm Baptise, RN 07/05/2016, 3:06 PM

## 2016-07-05 NOTE — Consult Note (Signed)
Name: Kimberly [name] MRN: [id number] DOB: 1961/02/02    ADMISSION DATE:  07/04/2016 CONSULTATION DATE:  12/7  REFERRING MD :  Erin Hearing NP  CHIEF COMPLAINT:  Large spontaneous right PTX   BRIEF PATIENT DESCRIPTION:  55 year old female who was admitted on 12/6 for elective C spine surgery. Awoke after surgery c/o right sided chest discomfort involving the right chest and radiating to right shoulder blade. This was worse w/ deep braeth and movement. By the AM of 12/7 she was having increased WOB and chest pain. O2 sats were 82% on room air. Initially medical services were consulted and a stat CXR was obtained. This showed a large right PTX for which we were consulted.   SIGNIFICANT EVENTS  Right Cook catheter placed 12/7>>>  STUDIES:     HISTORY OF PRESENT ILLNESS:   This is a 55 year old aaf who underwent elective C-spine surgery on 12/6. Post-op course was initially un-remarkable.   PAST MEDICAL HISTORY :   has a past medical history of Anxiety; Arthritis; Balance problem; Brain cyst; Chronic bronchitis with COPD (chronic obstructive pulmonary disease) ([place]); COPD (chronic obstructive pulmonary disease) ([place]); Depression; Difficult intubation; GERD (gastroesophageal reflux disease); Herpes; History of kidney stones; History of pneumonia; Hypertension; Inflammation of shoulder joint; PONV (postoperative nausea and vomiting); Recurrent falls; Shortness of breath dyspnea; and Urinary frequency.  has a past surgical history that includes Abdominal hysterectomy; Rotator cuff repair (Left); Foreign Body Removal (Left); Carpal tunnel release (Left, 02/16/2013); Carpal tunnel release (Right, 03/27/2013); Cesarean section; Anterior cervical decomp/discectomy fusion (N/A, 08/06/2014); Colonoscopy; Nasal sinus surgery (N/A, 04/13/2015); Shoulder arthroscopy with distal clavicle resection (Left, 05/18/2015); Shoulder acromioplasty (Left, 05/18/2015); Cholecystectomy (N/A, 06/17/2015); and Neck  surgery (2016). Prior to Admission medications   Medication Sig Start Date End Date Taking? Authorizing Provider  acyclovir (ZOVIRAX) 400 MG tablet Take 400 mg by mouth daily.    Yes Historical Provider, MD  amLODipine (NORVASC) 5 MG tablet Take 5 mg by mouth daily with breakfast.    Yes Historical Provider, MD  aspirin EC 81 MG tablet Take 81 mg by mouth daily.   Yes Historical Provider, MD  budesonide-formoterol (SYMBICORT) 160-4.5 MCG/ACT inhaler Inhale 2 puffs into the lungs 2 (two) times daily.   Yes Historical Provider, MD  clotrimazole (LOTRIMIN) 1 % cream Apply 1 application topically 2 (two) times daily.   Yes Historical Provider, MD  diazepam (VALIUM) 5 MG tablet Take 5 mg by mouth every 6 (six) hours as needed for anxiety.   Yes Historical Provider, MD  escitalopram (LEXAPRO) 10 MG tablet Take 10 mg by mouth daily.   Yes Historical Provider, MD  estradiol (ESTRACE) 1 MG tablet Take 1 mg by mouth daily with breakfast.    Yes Historical Provider, MD  gabapentin (NEURONTIN) 100 MG capsule TWO CAPSULES BY MOUTH EVERY 8 HOURS Patient taking differently: Take 200 mg by mouth every 8 (eight) hours.  01/04/15  Yes Carole Civil, MD  methocarbamol (ROBAXIN) 500 MG tablet Take 1 tablet (500 mg total) by mouth 2 (two) times daily. 01/09/16  Yes Carole Civil, MD  oxybutynin (DITROPAN-XL) 5 MG 24 hr tablet Take 5 mg by mouth at bedtime.   Yes Historical Provider, MD  oxyCODONE-acetaminophen (PERCOCET/ROXICET) 5-325 MG tablet Take by mouth every 4 (four) hours as needed for severe pain.   Yes Historical Provider, MD  pantoprazole (PROTONIX) 20 MG tablet Take 20 mg by mouth daily.   Yes Historical Provider, MD  Vitamin D, Cholecalciferol,  1000 units CAPS Take 1,000 Units by mouth daily.   Yes Historical Provider, MD   Allergies  Allergen Reactions  . Shellfish Allergy Anaphylaxis and Hives    "Swells throat up"  . Peanut-Containing Drug Products Hives  . Codeine Other (See Comments)     jitters    FAMILY HISTORY:  family history includes Hypertension in her father and other. SOCIAL HISTORY:  reports that she has never smoked. She has never used smokeless tobacco. She reports that she does not drink alcohol or use drugs.  REVIEW OF SYSTEMS:   Constitutional: Negative for fever, chills, weight loss, malaise/fatigue and diaphoresis.  HENT: Negative for hearing loss, ear pain, nosebleeds, congestion, sore throat, neck pain, tinnitus and ear discharge.   Eyes: Negative for blurred vision, double vision, photophobia, pain, discharge and redness.  Respiratory: Negative for cough, hemoptysis, sputum production, shortness of breath & has posterior shoulder blade pain , wheezing and stridor.   Cardiovascular: Negative for chest pain, palpitations, orthopnea, claudication, leg swelling and PND.  Gastrointestinal: Negative for heartburn, nausea, vomiting, abdominal pain, diarrhea, constipation, blood in stool and melena.  Genitourinary: Negative for dysuria, urgency, frequency, hematuria and flank pain.  Musculoskeletal: Negative for myalgias, back pain, joint pain and falls.  Skin: Negative for itching and rash.  Neurological: Negative for dizziness, tingling, tremors, sensory change, speech change, focal weakness, seizures, loss of consciousness, weakness and headaches.  Endo/Heme/Allergies: Negative for environmental allergies and polydipsia. Does not bruise/bleed easily.  SUBJECTIVE:  Feels better after CT  VITAL SIGNS: Temp:  [97 F (36.1 C)-99.5 F (37.5 C)] 98.7 F (37.1 C) (12/07 0941) Pulse Rate:  [97-105] 100 (12/07 0941) Resp:  [14-37] 18 (12/07 0941) BP: (92-129)/(65-83) 121/83 (12/07 0941) SpO2:  [93 %-100 %] 96 % (12/07 0941) Weight:  [164 lb 0.4 oz (74.4 kg)] 164 lb 0.4 oz (74.4 kg) (12/06 2124)  PHYSICAL EXAMINATION: General:  55 year old aaf.now resting comfortably s/p CT placement.  Neuro:  Awake, alert, no focal def.  HEENT:  C Collar in place. Dressing  intact. MMM Cardiovascular:  Tachy RRR no MRG Lungs:  Was decreased on right. Now improved. Right CT at 20 CMH2O sxn. Had excellent tidal and 1-2/7 airleak s/p placement  Abdomen:  Soft, not tender. + bowel sounds  Musculoskeletal:  Equal st and bulk  Skin:  Warm and dry    Recent Labs Lab 07/04/16 1126  NA 137  K 3.6  CL 102  CO2 26  BUN 6  CREATININE 0.67  GLUCOSE 87    Recent Labs Lab 07/04/16 1126  HGB 13.3  HCT 40.0  WBC 11.4*  PLT 236   Dg Chest 2 View  Result Date: 07/05/2016 CLINICAL DATA:  Postop patient with diminished right lung sounds. EXAM: CHEST  2 VIEW COMPARISON:  Chest radiograph 04/19/2015. FINDINGS: Anterior cervical spinal fusion hardware. Stable cardiac and mediastinal contours. There is a large right pneumothorax with collapse of the right lung. Left lung is clear. No pleural effusion. Cholecystectomy clips. Thoracic spine degenerative changes. Postsurgical change proximal left humerus. IMPRESSION: Large right pneumothorax with right lung collapse. Critical Value/emergent results were called by telephone at the time of interpretation on 07/05/2016 at 12:30 pm to Dr. Aggie Moats, who verbally acknowledged these results. Electronically Signed   By: Lovey Newcomer M.D.   On: 07/05/2016 12:32   Dg Cervical Spine 2-3 Views  Result Date: 07/04/2016 CLINICAL DATA:  C4-5, C5-6 ACDF. EXAM: CERVICAL SPINE - 2-3 VIEW COMPARISON:  CT 05/11/2016 FINDINGS: Two cross-table  fluoroscopic spot views obtained in the operating room during elective cervical spine surgery. C2-C3 fusion, as seen on prior CT. Anterior cervical fusion hardware at C3-C4 is again seen and unchanged. Surgical instrument localizes to C4-C5 disc space a on image number 1. Lower cervical hardware on prior CT is partially obscured on the current exam. There is placement of an interbody spacer at C4-C5 and C5-C6. Fluoroscopy time not reported. IMPRESSION: Intraoperative fluoroscopy during cervical spine surgery, with  instruments localizing to C4-C5 on initial image. Electronically Signed   By: Jeb Levering M.D.   On: 07/04/2016 21:17    ASSESSMENT / PLAN:  1) Large right spontaneous PTX -now s/p tube thoracostomy  Plan CT to 20 cm H2O sxn PRN analgesia  Cook cath tube maint protocol  Prophylactic ancef (emergent procedure) F/u CXR today and in am Suspect this can come out in next 48 hrs or so.   2) acute Hypoxic respiratory failure 2/2 #1 Plan Wean O2  3) h/o asthma Plan Cont nebulzied rx  4) HTN  Plan Cont norvasc   5) s/p anterior c-spine surg Plan Per neuro-surg   Erick Colace ACNP-BC [place] Pager # [phone] OR # [phone] if no answer   07/05/2016, 1:23 PM   STAFF NOTE: I, Merrie Roof, MD FACP have personally reviewed patient's available data, including medical history, events of note, physical examination and test results as part of my evaluation. I have discussed with resident/NP and other care providers such as pharmacist, RN and RRT. In addition, I personally evaluated patient and elicited key findings of: awake, normotensive, mild tachy, reduced BS rt, no crepitus, PCXR large tension rt PTX, etiology pos pressure from OR case? , place STAT chest tube, no needle decompression needed, hook to suction, I wonder that this was slowly developing over last 24 hours, keep to suction, repeat stat pcxr now and in am , will manage and follow, reduce O2 needs, move to sdu  Lavon Paganini. Titus Mould, MD, [place] Pgr: [facility] 07/05/2016 2:02 PM

## 2016-07-05 NOTE — Consult Note (Signed)
Consultation Note   Kimberly [name] [id number] DOB: 06/09/1961 DOA: 07/04/2016   PCP: Glendon Axe, MD   Patient coming from/Resides with: Private residence/lives with husband and son  Requesting physician: Dr. Truitt Leep  Reason for consultation: Hypoxemia and right-sided chest pain  HPI: Kimberly [name] is a 54 y.o. female with medical history significant for asthmatic COPD, hypertension, GERD, osteoarthritis who was admitted by the neurosurgical service to undergo elective cervical decompression/discectomy/fusion secondary to cervical radiculopathy. Patient awakened immediately after surgery complaining of very significant right-sided chest pain below the rib cage radiating through to the back which is very sharp in nature and increased with respiratory effort. Palpation over this area as well as movement of the arm had increased the pain. She's had a dry nonproductive cough. She is not having fevers. She thinks she may have had some mild right-sided chest discomfort prior to admission but nothing significant. She has not been having chest pain or dyspnea on exertion prior to admission and underwent a preoperative stress Myoview for surgical clearance. Today while working with occupational therapy O2 saturation was between 82 and 84% on 2 L increased to 86-88% on 3 L noting patient does not utilize oxygen at home. Internal medicine consultation was requested to assist with evaluation of these new symptoms    Review of Systems:  In addition to the HPI above,  No Fever-chills, myalgias or other constitutional symptoms No Headache, changes with Vision or hearing, new weakness, tingling, numbness in any extremity, dizziness, dysarthria or word finding difficulty, gait disturbance or imbalance, tremors or seizure activity No problems swallowing food or Liquids, indigestion/reflux, choking or coughing while eating, abdominal pain with or after eating No palpitations No Abdominal  pain, N/V, melena,hematochezia, dark tarry stools, constipation No dysuria, malodorous urine, hematuria or flank pain No new skin rashes, lesions, masses or bruises, No new joint pains, aches, swelling or redness No recent unintentional weight gain or loss No polyuria, polydypsia or polyphagia   Past Medical History:  Diagnosis Date  . Anxiety    takes alprazolam - rare use   . Arthritis    cervical spondylosis   . Balance problem   . Brain cyst   . Chronic bronchitis with COPD (chronic obstructive pulmonary disease) ([place])   . COPD (chronic obstructive pulmonary disease) ([place])   . Depression   . Difficult intubation    Small mouth opening, limited neck flexion, very anterior   . GERD (gastroesophageal reflux disease)    pt. reports that its better, no meds in use at this time- 2015  . Herpes   . History of kidney stones   . History of pneumonia   . Hypertension    pt. doesn't see cardiologist, followed for HTN by Dr. Gerarda Fraction  . Inflammation of shoulder joint   . PONV (postoperative nausea and vomiting)   . Recurrent falls   . Shortness of breath dyspnea   . Urinary frequency     Past Surgical History:  Procedure Laterality Date  . ABDOMINAL HYSTERECTOMY    . ANTERIOR CERVICAL DECOMP/DISCECTOMY FUSION N/A 08/06/2014   Procedure: ANTERIOR CERVICAL DECOMPRESSION/DISCECTOMY FUSION CERVICAL THREE-FOUR,CERVICAL SIX-SEVEN ,CERVICAL SEVEN-THORACIC ONE;  Surgeon: Floyce Stakes, MD;  Location: [facility];  Service: Neurosurgery;  Laterality: N/A;  . CARPAL TUNNEL RELEASE Left 02/16/2013   Procedure: CARPAL TUNNEL RELEASE;  Surgeon: Carole Civil, MD;  Location: [facility];  Service: Orthopedics;  Laterality: Left;  . CARPAL TUNNEL RELEASE Right 03/27/2013   Procedure: RIGHT CARPAL TUNNEL RELEASE;  Surgeon:  Carole Civil, MD;  Location: [facility];  Service: Orthopedics;  Laterality: Right;  . CESAREAN SECTION     x2  . CHOLECYSTECTOMY N/A 06/17/2015   Procedure: LAPAROSCOPIC  CHOLECYSTECTOMY;  Surgeon: Aviva Signs, MD;  Location: [facility];  Service: General;  Laterality: N/A;  . COLONOSCOPY    . FOREIGN BODY REMOVAL Left    knee-as child  . NASAL SINUS SURGERY N/A 04/13/2015   Procedure: nasal endoscopy with adenoid biopsy;  Surgeon: Ruby Cola, MD;  Location: [facility];  Service: ENT;  Laterality: N/A;  . NECK SURGERY  2016  . ROTATOR CUFF REPAIR Left   . SHOULDER ACROMIOPLASTY Left 05/18/2015   Procedure: SHOULDER ACROMIOPLASTY;  Surgeon: Earlie Server, MD;  Location: [facility];  Service: Orthopedics;  Laterality: Left;  . SHOULDER ARTHROSCOPY WITH DISTAL CLAVICLE RESECTION Left 05/18/2015   Procedure: LEFT SHOULDER ARTHROSCOPY WITH  DISTAL CLAVICLE RESECTION;  Surgeon: Earlie Server, MD;  Location: [facility];  Service: Orthopedics;  Laterality: Left;    Social History   Social History  . Marital status: Married    Spouse name: N/A  . Number of children: N/A  . Years of education: N/A   Occupational History  . Not on file.   Social History Main Topics  . Smoking status: Never Smoker  . Smokeless tobacco: Never Used  . Alcohol use No  . Drug use: No  . Sexual activity: Yes    Birth control/ protection: Surgical   Other Topics Concern  . Not on file   Social History Narrative  . No narrative on file    Mobility: Without assistive devices Work history: Is not working outside the home   Allergies  Allergen Reactions  . Shellfish Allergy Anaphylaxis and Hives    "Swells throat up"  . Peanut-Containing Drug Products Hives  . Codeine Other (See Comments)    jitters    Family History  Problem Relation Age of Onset  . Hypertension Father   . Hypertension Other      Prior to Admission medications   Medication Sig Start Date End Date Taking? Authorizing Provider  acyclovir (ZOVIRAX) 400 MG tablet Take 400 mg by mouth daily.    Yes Historical Provider, MD  amLODipine (NORVASC) 5 MG tablet Take 5 mg by  mouth daily with breakfast.    Yes Historical Provider, MD  aspirin EC 81 MG tablet Take 81 mg by mouth daily.   Yes Historical Provider, MD  budesonide-formoterol (SYMBICORT) 160-4.5 MCG/ACT inhaler Inhale 2 puffs into the lungs 2 (two) times daily.   Yes Historical Provider, MD  clotrimazole (LOTRIMIN) 1 % cream Apply 1 application topically 2 (two) times daily.   Yes Historical Provider, MD  diazepam (VALIUM) 5 MG tablet Take 5 mg by mouth every 6 (six) hours as needed for anxiety.   Yes Historical Provider, MD  escitalopram (LEXAPRO) 10 MG tablet Take 10 mg by mouth daily.   Yes Historical Provider, MD  estradiol (ESTRACE) 1 MG tablet Take 1 mg by mouth daily with breakfast.    Yes Historical Provider, MD  gabapentin (NEURONTIN) 100 MG capsule TWO CAPSULES BY MOUTH EVERY 8 HOURS Patient taking differently: Take 200 mg by mouth every 8 (eight) hours.  01/04/15  Yes Carole Civil, MD  methocarbamol (ROBAXIN) 500 MG tablet Take 1 tablet (500 mg total) by mouth 2 (two) times daily. 01/09/16  Yes Carole Civil, MD  oxybutynin (DITROPAN-XL) 5 MG 24 hr tablet Take 5 mg  by mouth at bedtime.   Yes Historical Provider, MD  oxyCODONE-acetaminophen (PERCOCET/ROXICET) 5-325 MG tablet Take by mouth every 4 (four) hours as needed for severe pain.   Yes Historical Provider, MD  pantoprazole (PROTONIX) 20 MG tablet Take 20 mg by mouth daily.   Yes Historical Provider, MD  Vitamin D, Cholecalciferol, 1000 units CAPS Take 1,000 Units by mouth daily.   Yes Historical Provider, MD    Physical Exam: Vitals:   07/04/16 2124 07/05/16 0100 07/05/16 0441 07/05/16 0941  BP: 102/65 129/79 119/75 121/83  Pulse: 97 (!) 105 (!) 101 100  Resp: 14 16 18 18   Temp: 98.8 F (37.1 C) 99.2 F (37.3 C) 99.5 F (37.5 C) 98.7 F (37.1 C)  TempSrc: Oral Oral Oral Oral  SpO2: 100% 100% 100% 96%  Weight: 74.4 kg (164 lb 0.4 oz)     Height: 5\' 1"  (1.549 m)         Constitutional: NAD, Somewhat anxious,  uncomfortable especially with movement of right arm and taking a deep breath Eyes: PERRL, lids and conjunctivae normal ENMT: Mucous membranes are moist. Posterior pharynx clear of any exudate or lesions. Normal dentition. Hard cervical collar in place. Phonation is hoarse but no stridor. Neck: normal, supple, no masses, no thyromegaly Respiratory: Lung sounds diminished throughout entire right side with left basilar crackles but no wheezing otherwise good air movement on the left, slight increased work of breathing with use of accessory muscles, 3 L nasal cannula oxygen. Cardiovascular: Regular rate and rhythm, no murmurs / rubs / gallops. No extremity edema. 2+ pedal pulses. No carotid bruits.  Abdomen: no tenderness, no masses palpated. No hepatosplenomegaly. Bowel sounds positive.  Musculoskeletal: no clubbing / cyanosis. No joint deformity upper and lower extremities. Good ROM, no contractures. Normal muscle tone. Positive tenderness to palpation over right lower anterior rib cage as well as right lower back at level of rib cage. Chest pain reproducible with movement of right arm as well. Skin: no rashes, lesions, ulcers. No induration Neurologic: CN 2-12 grossly intact. Sensation intact, DTR normal. Strength 5/5 x all 4 extremities.  Psychiatric: Normal judgment and insight. Alert and oriented x 3. Somewhat anxious mood.    Labs on Admission: I have personally reviewed following labs and imaging studies  CBC:  Recent Labs Lab 07/04/16 1126  WBC 11.4*  HGB 13.3  HCT 40.0  MCV 92.6  PLT [id number]   Basic Metabolic Panel:  Recent Labs Lab 07/04/16 1126  NA 137  K 3.6  CL 102  CO2 26  GLUCOSE 87  BUN 6  CREATININE 0.67  CALCIUM 9.3   GFR: Estimated Creatinine Clearance: 73.3 mL/min (by C-G formula based on SCr of 0.67 mg/dL). Liver Function Tests: No results for input(s): AST, ALT, ALKPHOS, BILITOT, PROT, ALBUMIN in the last 168 hours. No results for input(s): LIPASE, AMYLASE  in the last 168 hours. No results for input(s): AMMONIA in the last 168 hours. Coagulation Profile: No results for input(s): INR, PROTIME in the last 168 hours. Cardiac Enzymes: No results for input(s): CKTOTAL, CKMB, CKMBINDEX, TROPONINI in the last 168 hours. BNP (last 3 results) No results for input(s): PROBNP in the last 8760 hours. HbA1C: No results for input(s): HGBA1C in the last 72 hours. CBG: No results for input(s): GLUCAP in the last 168 hours. Lipid Profile: No results for input(s): CHOL, HDL, LDLCALC, TRIG, CHOLHDL, LDLDIRECT in the last 72 hours. Thyroid Function Tests: No results for input(s): TSH, T4TOTAL, FREET4, T3FREE, THYROIDAB in the last  72 hours. Anemia Panel: No results for input(s): VITAMINB12, FOLATE, FERRITIN, TIBC, IRON, RETICCTPCT in the last 72 hours. Urine analysis:    Component Value Date/Time   COLORURINE YELLOW 07/31/2015 [place] 07/31/2015 0947   LABSPEC 1.010 07/31/2015 0947   PHURINE 7.0 07/31/2015 0947   GLUCOSEU NEGATIVE 07/31/2015 0947   HGBUR SMALL (A) 07/31/2015 0947   BILIRUBINUR NEGATIVE 07/31/2015 0947   KETONESUR NEGATIVE 07/31/2015 0947   PROTEINUR NEGATIVE 07/31/2015 0947   UROBILINOGEN 0.2 06/12/2015 2142   NITRITE NEGATIVE 07/31/2015 0947   LEUKOCYTESUR NEGATIVE 07/31/2015 0947   Sepsis Labs: @LABRCNTIP (procalcitonin:4,lacticidven:4) ) Recent Results (from the past 240 hour(s))  Surgical pcr screen     Status: None   Collection Time: 07/05/16  3:58 AM  Result Value Ref Range Status   MRSA, PCR NEGATIVE NEGATIVE Final   Staphylococcus aureus NEGATIVE NEGATIVE Final    Comment:        The Xpert SA Assay (FDA approved for NASAL specimens in patients over 62 years of age), is one component of a comprehensive surveillance program.  Test performance has been validated by [facility] [facility] for patients greater than or equal to 30 year old. It is not intended to diagnose infection nor to guide or monitor  treatment.      Radiological Exams on Admission: Dg Cervical Spine 2-3 Views  Result Date: 07/04/2016 CLINICAL DATA:  C4-5, C5-6 ACDF. EXAM: CERVICAL SPINE - 2-3 VIEW COMPARISON:  CT 05/11/2016 FINDINGS: Two cross-table fluoroscopic spot views obtained in the operating room during elective cervical spine surgery. C2-C3 fusion, as seen on prior CT. Anterior cervical fusion hardware at C3-C4 is again seen and unchanged. Surgical instrument localizes to C4-C5 disc space a on image number 1. Lower cervical hardware on prior CT is partially obscured on the current exam. There is placement of an interbody spacer at C4-C5 and C5-C6. Fluoroscopy time not reported. IMPRESSION: Intraoperative fluoroscopy during cervical spine surgery, with instruments localizing to C4-C5 on initial image. Electronically Signed   By: Jeb Levering M.D.   On: 07/04/2016 21:17    EKG: (Independently reviewed) Sinus rhythm with PACs, ventricular rate 96 bpm, QTC 434 ms, no ST segment or T-wave changes that would be concerning for acute ischemia.  Assessment/Plan Principal Problem:   Acute respiratory failure with hypoxia  -Abrupt in onset and etiology unclear -Differential includes PE versus pneumonia versus heart failure versus pneumothorax -Stat portable chest x-ray pending -Continue supportive care with supplemental oxygen -Treat underlying problems (see below) -Check d-dimer and BNP -Patient with documented difficult intubation but once intubated no documented issue with ventilation (anesthesiology notes)  **1245 pm: PCXR demonstrates large right side pneumothorax- PCCM consulted for STAT CT placement. Pt updated on status by Dr. Aggie Moats. BNP and d dimer cancelled  Active Problems:   Asthma -Moving air well on the left side with diminished air movement on right and no wheezing therefore not typical of acute exacerbation of asthma -Add duo nebs for now -Continue pulmonary toileting with incentive spirometry     Acute chest wall pain -Possible musculoskeletal etiology so give one-time dose Toradol and monitor for response -Does not appear typical for ischemic pain and EKG unremarkable but will check troponin 1    HTN (hypertension) -Blood pressure currently controlled -Continue Norvasc    GERD (gastroesophageal reflux disease) -Continue Protonix    Cervical stenosis of spinal canal -Management per neurosurgical team        ELLIS,ALLISON L. ANP-BC [facility] Pager [phone]  If 7PM-7AM, please contact night-coverage [email or web address] Password [email or web address]  07/05/2016, 12:01 PM

## 2016-07-05 NOTE — Procedures (Signed)
Chest Tube Insertion Procedure Note  Indications:  Clinically significant Pneumothorax  Pre-operative Diagnosis: Pneumothorax  Post-operative Diagnosis: Pneumothorax  Procedure Details  Informed consent was obtained for the procedure, including sedation.  Risks of lung perforation, hemorrhage, arrhythmia, and adverse drug reaction were discussed.   After sterile skin prep, using standard technique, a 14 French tube was placed in the right lateral 5th  rib space.  Findings: Immediate airl leak and excellent tidal  Estimated Blood Loss:  Minimal         Specimens:  None              Complications:  None; patient tolerated the procedure well.         Disposition: sdu          Condition: stable  Erick Colace ACNP-BC [place] Pager # [phone] OR # [phone] if no answer  I supervised and perfomed this procedure Shcloraprep, lido, dermotomy, placement needle and wire then pigtail Aur gush out After sutured then to sahara, no further leak Leave to suctiuon Stat pcxr  Lavon Paganini. Titus Mould, MD, [place] Pgr: [facility]

## 2016-07-06 ENCOUNTER — Inpatient Hospital Stay (HOSPITAL_COMMUNITY): Payer: BLUE CROSS/BLUE SHIELD

## 2016-07-06 NOTE — Consult Note (Signed)
  [facility] TEAM 1 - Stepdown/ICU TEAM  Thanks to PCCM who is attending to all active medical issues in the role of Conservator, museum/gallery.  [facility] will sign off.  Cherene Altes, MD [facility] Office  [phone] Pager - Text Page per Amion as per below:  On-Call/Text Page:      [email or web address]      password [email or web address]  If 7PM-7AM, please contact night-coverage [email or web address] Password [facility] 07/06/2016, 8:43 AM

## 2016-07-06 NOTE — Op Note (Signed)
NAME:  HENLIE, SHILL                   ACCOUNT NO.:  MEDICAL RECORD NO.:  [id number]  LOCATION:                                 FACILITY:  PHYSICIAN:  Leeroy Cha, M.D.   DATE OF BIRTH:  Dec 05, 1960  DATE OF PROCEDURE:  07/04/2016 DATE OF DISCHARGE:                              OPERATIVE REPORT   PREOPERATIVE DIAGNOSIS:  Cervical 4-5, 5-6 stenosis with radiculopathy. Status post fusion cervical 3-4, 6-7, 7-1.  POSTOPERATIVE DIAGNOSIS:  Cervical 4-5, 5-6 stenosis with radiculopathy. Status post fusion cervical 3-4, 6-7, 7-1.  PROCEDURE:  Anterior 4-5, 5-6 diskectomy, decompression of spinal cord, interbody fusion with allograft, microscope.  SURGEON:  Leeroy Cha, M.D.  CLINICAL HISTORY:  Ms. Blacksher is a lady who in the past underwent fusion at the level of cervical 3-4, 6-7, and 7-1.  Nevertheless, lately she had been complaining of neck pain worsened to both upper extremities.  X-rays show worsening of the degenerative disk disease at the level of 4-5, 5-6.  The lumbar fusion was solid.  There was question of no union at the level of cervical 3-4.  The patient had failed conservative treatment including medication.  Surgery was chosen.  She knew well all the risks and benefits with surgery.  DESCRIPTION OF PROCEDURE:  The patient was taken to the OR and after intubation, the left side of the neck was cleaned with DuraPrep.  The patient has quite a bit of short neck.  We knew from previous history that she was a difficult intubation, which indeed she was.  We put a roll between the shoulder blades.  Then, the left side of the neck was cleaned with DuraPrep and a transverse incision following the previous one upper was made through the skin and subcutaneous tissue. Immediately, we found quite a bit of adhesion and quite a bit of enlargement of the jugular vein.  Dissection was carried out all the way until we were able to see the lower plate.  We tried to dissect from  the previous plate and our dissection was carried out separating the facial vein as well as the submandibular gland.  The patient had quite a bit of adhesion between C3-C4.  From then on, we identified the 4-5 with x-ray as well as the 5-6.  We brought the microscope into the area.  Total diskectomy was achieved.  We opened the posterior ligament and quite a bit of spondylosis medially and laterally was found.  Using the 1, 2, and 3 mm Kerrison punch, we were able to decompress the C5 and C6 nerve root.  The patient had quite a bit of epidural bleeding, which was taken care with Gel-Foam.  Then, the endplates were drilled.  We inserted 2 allografts of 6 mm height, lordotic.  From then on, the patient had quite a bit of bleeding from the facial vein and that was coagulated. It was difficult to remove the plate between [id number].  So using metal drill, I made a notch in the lower plate of 075-GRM and the upper of 6-7. Nevertheless, it was quite impossible to insert a plate including the one with only 2 screws.  From then on,  we decided to not use any hardware.  The area was irrigated.  I did Valsalva maneuver twice up to 40, which was negative.  Hemostasis was achieved.  A drain was left in the operative site and the wound was closed with Vicryl and Steri-Strips.          ______________________________ Leeroy Cha, M.D.     EB/MEDQ  D:  07/04/2016  T:  07/05/2016  Job:  [id number]

## 2016-07-06 NOTE — Consult Note (Signed)
Name: Kimberly [name] MRN: [id number] DOB: 1961/02/02    ADMISSION DATE:  07/04/2016 CONSULTATION DATE:  12/7  REFERRING MD :  Erin Hearing NP  CHIEF COMPLAINT:  Large spontaneous right PTX   BRIEF PATIENT DESCRIPTION:  55 year old female who was admitted on 12/6 for elective C spine surgery. Awoke after surgery c/o right sided chest discomfort involving the right chest and radiating to right shoulder blade. This was worse w/ deep breath and movement. By the AM of 12/7 she was having increased WOB and chest pain. O2 sats were 82% on room air. Initially medical services were consulted and a stat CXR was obtained. This showed a large right PTX for which we were consulted.   SIGNIFICANT EVENTS  Right Cook catheter placed 12/7>>>  STUDIES:  CXR 07/05/2016  IMPRESSION: Placement of right chest tube with resolution of right-sided pneumothorax. Bibasilar atelectasis.   HISTORY OF PRESENT ILLNESS:   This is a 55 year old aaf who underwent elective C-spine surgery on 12/6. Post-op course was initially un-remarkable. She developed chest pain and increased WOB 12/7, with desaturations to 82%, CXR indicated a large right PTX. ? Positive pressure form OR case may be etiology. Right Cook cath was placed by CCM 12/7 with resolution of PTX per CXR confirmation.    SUBJECTIVE:  Feels better , but not back to baseline VITAL SIGNS: Temp:  [98 F (36.7 C)-98.9 F (37.2 C)] 98.8 F (37.1 C) (12/08 1100) Pulse Rate:  [78-105] 90 (12/08 1100) Resp:  [14-25] 24 (12/08 1100) BP: (113-165)/(86-98) 134/92 (12/08 1100) SpO2:  [92 %-97 %] 92 % (12/08 1100)  PHYSICAL EXAMINATION: General:  55 year old aaf.now resting comfortably in chair on 2 L [place]  Neuro:  Awake, alert, no focal def.  HEENT:  C Collar in place. Dressing intact. MMM Cardiovascular:  Tachy RRR no MRG Lungs:   Bilateral breath sounds with some rhonchi noted. Right CT at 20 CMH2O sxn. Had excellent tidal and 1-2/7 airleak s/p  placement  Abdomen:  Soft, not tender. + bowel sounds  Musculoskeletal:  Equal st and bulk  Skin:  Warm and dry    Recent Labs Lab 07/04/16 1126  NA 137  K 3.6  CL 102  CO2 26  BUN 6  CREATININE 0.67  GLUCOSE 87    Recent Labs Lab 07/04/16 1126  HGB 13.3  HCT 40.0  WBC 11.4*  PLT 236   Dg Chest 2 View  Result Date: 07/05/2016 CLINICAL DATA:  Postop patient with diminished right lung sounds. EXAM: CHEST  2 VIEW COMPARISON:  Chest radiograph 04/19/2015. FINDINGS: Anterior cervical spinal fusion hardware. Stable cardiac and mediastinal contours. There is a large right pneumothorax with collapse of the right lung. Left lung is clear. No pleural effusion. Cholecystectomy clips. Thoracic spine degenerative changes. Postsurgical change proximal left humerus. IMPRESSION: Large right pneumothorax with right lung collapse. Critical Value/emergent results were called by telephone at the time of interpretation on 07/05/2016 at 12:30 pm to Dr. Aggie Moats, who verbally acknowledged these results. Electronically Signed   By: Lovey Newcomer M.D.   On: 07/05/2016 12:32   Dg Cervical Spine 2-3 Views  Result Date: 07/04/2016 CLINICAL DATA:  C4-5, C5-6 ACDF. EXAM: CERVICAL SPINE - 2-3 VIEW COMPARISON:  CT 05/11/2016 FINDINGS: Two cross-table fluoroscopic spot views obtained in the operating room during elective cervical spine surgery. C2-C3 fusion, as seen on prior CT. Anterior cervical fusion hardware at C3-C4 is again seen and unchanged. Surgical instrument localizes to C4-C5  disc space a on image number 1. Lower cervical hardware on prior CT is partially obscured on the current exam. There is placement of an interbody spacer at C4-C5 and C5-C6. Fluoroscopy time not reported. IMPRESSION: Intraoperative fluoroscopy during cervical spine surgery, with instruments localizing to C4-C5 on initial image. Electronically Signed   By: Jeb Levering M.D.   On: 07/04/2016 21:17   Dg Chest Port 1 View  Result Date:  07/05/2016 CLINICAL DATA:  Insertion of right chest tube. EXAM: PORTABLE CHEST 1 VIEW COMPARISON:  July 05, 2016 FINDINGS: A new right chest tube is identified. The previously seen right-sided pneumothorax has resolved. Mild atelectasis at the left base. Mild atelectasis in the medial right base. No other interval changes or acute abnormalities. IMPRESSION: Placement of right chest tube with resolution of right-sided pneumothorax. Bibasilar atelectasis. Electronically Signed   By: Dorise Bullion III M.D   On: 07/05/2016 14:06    ASSESSMENT / PLAN:  1) Large right spontaneous PTX -now resolved  s/p tube thoracostomy  No air Leak clinically Plan PRN analgesia  Cook cath tube maint protocol  Prophylactic ancef (emergent procedure) F/u CXR today, 12/8 If CXR ok, consider transition to water seal. Suspect this can come out in next 48 hrs or so.   2) acute Hypoxic respiratory failure 2/2 #1 Plan Wean O2 as able Mobilize as able with recent cervical surgery limitations  3) h/o asthma Plan Cont Duoneb nebulzied rx  4) HTN  Plan Cont norvasc   5) s/p anterior c-spine surg Plan Per neuro-surg   Magdalen Spatz, AGACNP-BC [place] Pager  # [phone]  07/06/2016, 12:07 PM   Attending Note:  I have examined patient, reviewed labs, studies and notes. I have discussed the case with Gladstone Pih, and I agree with the data and plans as amended above. CXR from 13:00 shows no PTX. Chest tube placed to waterseal. Will recheck CXR am 12/9. If no recurrent PTX then chest tube can be pulled.   Baltazar Apo, MD, PhD 07/06/2016, 3:43 PM [facility] [phone] or if no answer [phone]

## 2016-07-06 NOTE — Progress Notes (Signed)
Physical Therapy Treatment Patient Details Name: Kimberly [name] MRN: [id number] DOB: June 10, 1961 Today's Date: 07/06/2016    History of Present Illness CERVICAL FOUR-FIVE, CERVICAL FIVE-SIX ANTERIOR CERVICAL DECOMPRESSION/DISCECTOMY/FUSION WITH REVISION OF CERVICAL THREE-FOUR PLATE (N/A). PMH includes but not necessarily limited to, COPD.  Found to have R pneumothorax transferred to stepdown now with chest tube/    PT Comments    Patient progressing this session to hallway ambulation and able to move with less assist.  Still with high O2 requirement and desats to low 80's with ambulation.  Feel [name].   Follow Up Recommendations  Home health PT;Supervision for mobility/OOB     Equipment Recommendations  Rolling walker with 5" wheels (youth RW)    Recommendations for Other Services       Precautions / Restrictions Precautions Precautions: Cervical Required Braces or Orthoses: Cervical Brace Cervical Brace: Hard collar Restrictions Weight Bearing Restrictions: No    Mobility  Bed Mobility Overal bed mobility: Needs Assistance Bed Mobility: Rolling;Sidelying to Sit Rolling: Supervision Sidelying to sit: Min guard       General bed mobility comments: cues for technique assist for line management  Transfers Overall transfer level: Needs assistance Equipment used: Pushed w/c Transfers: Sit to/from Stand   Stand pivot transfers: Min guard       General transfer comment: assist for balance/safety and lines  Ambulation/Gait Ambulation/Gait assistance: Min guard Ambulation Distance (Feet): 80 Feet Assistive device: Pushed wheelchair Gait Pattern/deviations: Step-through pattern;Decreased stride length;Trunk flexed     General Gait Details: cues for posture, assist with turns; SpO2 down to 82% amb on 4L O2, stopped in standing to rest and cues for breathing, up to 90% in about 90 seconds, then walked back to room;  reports painful with deep inspiration   Stairs            Wheelchair Mobility    Modified Rankin (Stroke Patients Only)       Balance Overall balance assessment: Needs assistance Sitting-balance support: Feet unsupported Sitting balance-Leahy Scale: Good       Standing balance-Leahy Scale: Fair                      Cognition Arousal/Alertness: Awake/alert Behavior During Therapy: WFL for tasks assessed/performed Overall Cognitive Status: Within Functional Limits for tasks assessed                      Exercises      General Comments General comments (skin integrity, edema, etc.): Handout with precautions re-issued due to pt reports lost during transfer to SDU; reviewed again prior to mobility      Pertinent Vitals/Pain Pain Assessment: Faces Faces Pain Scale: Hurts whole lot Pain Location: R chest and neck Pain Descriptors / Indicators: Discomfort;Aching Pain Intervention(s): Premedicated before session;Repositioned    Home Living                      Prior Function            PT Goals (current goals can now be found in the care plan section) Progress towards PT goals: Progressing toward goals    Frequency    Min 5X/week      PT Plan Current plan remains appropriate    Co-evaluation             End of Session Equipment Utilized During Treatment: Gait belt;Oxygen;Cervical collar Activity Tolerance: Patient limited  by fatigue;Treatment limited secondary to medical complications (Comment) (hypoxic on 4L O2) Patient left: in chair;with call bell/phone within reach;with nursing/sitter in room     Time: 0940-1015 PT Time Calculation (min) (ACUTE ONLY): 35 min  Charges:  $Gait Training: 8-22 mins $Therapeutic Activity: 8-22 mins                    G Codes:      Kimberly [name] 07/11/2016, 10:32 AM  Kimberly [name], [place] 11-Jul-2016

## 2016-07-07 ENCOUNTER — Inpatient Hospital Stay (HOSPITAL_COMMUNITY): Payer: BLUE CROSS/BLUE SHIELD

## 2016-07-07 NOTE — Progress Notes (Signed)
Physical Therapy Treatment Patient Details Name: Kimberly [name] MRN: [id number] DOB: 02/18/1961 Today's Date: 07/07/2016    History of Present Illness CERVICAL FOUR-FIVE, CERVICAL FIVE-SIX ANTERIOR CERVICAL DECOMPRESSION/DISCECTOMY/FUSION WITH REVISION OF CERVICAL THREE-FOUR PLATE (N/A). PMH includes but not necessarily limited to, COPD.  Found to have R pneumothorax transferred to stepdown now with chest tube/    PT Comments    Patient making improvement with mobility and gait.  Continues to desat during gait while on O2 at 4L.  Follow Up Recommendations  Home health PT;Supervision for mobility/OOB     Equipment Recommendations  Rolling walker with 5" wheels    Recommendations for Other Services       Precautions / Restrictions Precautions Precautions: Cervical Required Braces or Orthoses: Cervical Brace Cervical Brace: Hard collar Restrictions Weight Bearing Restrictions: No    Mobility  Bed Mobility Overal bed mobility: Needs Assistance Bed Mobility: Rolling;Sidelying to Sit;Sit to Sidelying Rolling: Supervision Sidelying to sit: Min guard     Sit to sidelying: Min guard General bed mobility comments: cues for technique assist for line management  Transfers Overall transfer level: Needs assistance Equipment used: Pushed w/c Transfers: Sit to/from Stand Sit to Stand: Min guard         General transfer comment: assist for balance/safety and lines  Ambulation/Gait Ambulation/Gait assistance: Min guard Ambulation Distance (Feet): 140 Feet Assistive device: Pushed wheelchair Gait Pattern/deviations: Step-through pattern;Decreased stride length Gait velocity: decreased Gait velocity interpretation: Below normal speed for age/gender General Gait Details: Patient with upright posture today.  Patient on O2 at 4L during gait.  O2 sats dropped as low as 86%.  Required 4 standing rest breaks to use pursed-lip breathing, with O2 sats increasing to 90%.   Stairs            Wheelchair Mobility    Modified Rankin (Stroke Patients Only)       Balance     Sitting balance-Leahy Scale: Good     Standing balance support: No upper extremity supported Standing balance-Leahy Scale: Fair                      Cognition Arousal/Alertness: Awake/alert Behavior During Therapy: WFL for tasks assessed/performed Overall Cognitive Status: Within Functional Limits for tasks assessed                      Exercises      General Comments        Pertinent Vitals/Pain Pain Assessment: 0-10 Pain Score: 2  Pain Location: R chest and neck Pain Descriptors / Indicators: Discomfort;Aching Pain Intervention(s): Monitored during session;Repositioned   O2 sats dropped to 86% during gait on O2 at 4L Returned to 90% with pursed-lip breathing    Home Living                      Prior Function            PT Goals (current goals can now be found in the care plan section) Acute Rehab PT Goals Patient Stated Goal: home when ready Progress towards PT goals: Progressing toward goals    Frequency    Min 5X/week      PT Plan Current plan remains appropriate    Co-evaluation             End of Session Equipment Utilized During Treatment: Gait belt;Oxygen;Cervical collar Activity Tolerance: Treatment limited secondary to medical complications (Comment) (O2 sats decreasing to 86% on 4L O2  during gait) Patient left: in bed;with call bell/phone within reach     Time: 1157-1218 PT Time Calculation (min) (ACUTE ONLY): 21 min  Charges:  $Gait Training: 8-22 mins                    G Codes:      Despina Pole Jul 10, 2016, 1:09 PM Carita Pian. Sanjuana Kava, [place] Pager [phone]

## 2016-07-07 NOTE — Progress Notes (Signed)
Name: Kimberly [name] MRN: [id number] DOB: 03/31/1961    ADMISSION DATE:  07/04/2016 CONSULTATION DATE:  12/7  REFERRING MD :  Erin Hearing NP  CHIEF COMPLAINT:  Large spontaneous right PTX   BRIEF PATIENT DESCRIPTION:  55 year old female who was admitted on 12/6 for elective C spine surgery. Awoke after surgery c/o right sided chest discomfort involving the right chest and radiating to right shoulder blade. This was worse w/ deep breath and movement. By the AM of 12/7 she was having increased WOB and chest pain. O2 sats were 82% on room air. Initially medical services were consulted and a stat CXR was obtained. This showed a large right PTX for which we were consulted.   SIGNIFICANT EVENTS  Right Cook catheter placed 12/7>> removed 07/07/16        SUBJECTIVE:  R cp with cough / deep breathing  VITAL SIGNS: Temp:  [98.2 F (36.8 C)-99.4 F (37.4 C)] 98.6 F (37 C) (12/09 1100) Pulse Rate:  [85-105] 85 (12/09 1100) Resp:  [22-37] 24 (12/09 1100) BP: (127-160)/(88-101) 140/90 (12/09 1100) SpO2:  [85 %-92 %] 91 % (12/09 1100)  0 2  3lpm NP   PHYSICAL EXAMINATION: General:  Pleasant bf  resting comfortably supine on 2lpm   Neuro:  Awake, alert, no focal def.  HEENT:  C Collar in place. Dressing intact. MMM Cardiovascular:  Tachy RRR no MRG Lungs: clear bilaterally/ no fluctuation in Chest tube air fluid level/ on water seal   Abdomen:  Soft, not tender. + bowel sounds  Musculoskeletal:  Equal st and bulk  Skin:  Warm and dry    Recent Labs Lab 07/04/16 1126  NA 137  K 3.6  CL 102  CO2 26  BUN 6  CREATININE 0.67  GLUCOSE 87    Recent Labs Lab 07/04/16 1126  HGB 13.3  HCT 40.0  WBC 11.4*  PLT 236   Dg Chest Port 1 View  Result Date: 07/07/2016 CLINICAL DATA:  History of pneumothorax. EXAM: PORTABLE CHEST 1 VIEW COMPARISON:  Chest radiograph 07/06/2016. FINDINGS: Anterior cervical spinal fusion hardware. Monitoring leads overlie the patient. Right chest  tube remains in place. Low lung volumes. Stable cardiac and mediastinal contours. Heterogeneous opacities right lower lung. No definite pleural effusion or pneumothorax. IMPRESSION: Right chest tube remains in place.  No definite pneumothorax. Persistent right hemidiaphragm elevation and suspected atelectasis. Electronically Signed   By: Lovey Newcomer M.D.   On: 07/07/2016 07:04   Dg Chest Port 1 View  Result Date: 07/06/2016 CLINICAL DATA:  Shortness of breath. EXAM: PORTABLE CHEST 1 VIEW COMPARISON:  Radiograph of July 05, 2016. FINDINGS: Stable cardiomediastinal silhouette. Stable position of right-sided pigtail catheter. No pneumothorax is noted. Hypoinflation of the lungs is noted. Left lower lobe atelectasis is noted. Stable right basilar atelectasis is noted as well. Bony thorax is unremarkable. IMPRESSION: Stable position of right-sided chest tube without pneumothorax. Bibasilar subsegmental atelectasis is noted. Electronically Signed   By: Marijo Conception, M.D.   On: 07/06/2016 13:15    ASSESSMENT / PLAN:  1) Large right   PTX -now resolved  s/p tube thoracostomy  No air Leak clinically  Plan PRN analgesia  Cook cath tube maint protocol  D/c R chest tube per nursing protocol then recheck cxr this pm   2) acute Hypoxic respiratory failure 2/2 #1 Plan Wean O2 as able Mobilize as able with recent cervical surgery limitations  3)  asthma Plan Cont Duoneb nebulzied rx  4)  HTN  Plan Cont norvasc   5) s/p anterior c-spine surg Plan Per neuro-surg     Christinia Gully, MD [facility] [place] [phone] After 5:30 PM or weekends, call [phone]

## 2016-07-07 NOTE — Progress Notes (Addendum)
Pw Dr. Melvyn Novas with pulmonary critical care, verbal order received to remove pigtail chest tube. Chest tube removed and patient tolerated procedure well. Will continue to monitor.

## 2016-07-07 NOTE — Progress Notes (Signed)
Patient ID: Kimberly [name], female   DOB: 08/04/1960, 55 y.o.   MRN: [id number] Doing great chest tube out. No complains

## 2016-07-07 NOTE — Progress Notes (Signed)
Patient ID: Kimberly [name], female   DOB: 1961-06-16, 54 y.o.   MRN: [id number] Subjective:  The patient is alert and pleasant. She is in no apparent distress. The chest tube is still in place.  Objective: Vital signs in last 24 hours: Temp:  [98.2 F (36.8 C)-99.4 F (37.4 C)] 98.2 F (36.8 C) (12/09 0700) Pulse Rate:  [89-105] 89 (12/09 0700) Resp:  [22-37] 24 (12/09 0700) BP: (127-160)/(88-101) 135/91 (12/09 0700) SpO2:  [85 %-92 %] 91 % (12/09 0700)  Intake/Output from previous day: 12/08 0701 - 12/09 0700 In: 320 [P.O.:120; IV Piggyback:200] Out: 2445 [Urine:2275; Drains:50; Chest Tube:120] Intake/Output this shift: Total I/O In: -  Out: 350 [Urine:350]  Physical exam the patient is alert and oriented. She is moving all 4 extremities well. Her wound is healing well without signs of hematoma or shift.  Lab Results:  Recent Labs  07/04/16 1126  WBC 11.4*  HGB 13.3  HCT 40.0  PLT 236   BMET  Recent Labs  07/04/16 1126  NA 137  K 3.6  CL 102  CO2 26  GLUCOSE 87  BUN 6  CREATININE 0.67  CALCIUM 9.3    Studies/Results: Dg Chest 2 View  Result Date: 07/05/2016 CLINICAL DATA:  Postop patient with diminished right lung sounds. EXAM: CHEST  2 VIEW COMPARISON:  Chest radiograph 04/19/2015. FINDINGS: Anterior cervical spinal fusion hardware. Stable cardiac and mediastinal contours. There is a large right pneumothorax with collapse of the right lung. Left lung is clear. No pleural effusion. Cholecystectomy clips. Thoracic spine degenerative changes. Postsurgical change proximal left humerus. IMPRESSION: Large right pneumothorax with right lung collapse. Critical Value/emergent results were called by telephone at the time of interpretation on 07/05/2016 at 12:30 pm to Dr. Aggie Moats, who verbally acknowledged these results. Electronically Signed   By: Lovey Newcomer M.D.   On: 07/05/2016 12:32   Dg Chest Port 1 View  Result Date: 07/07/2016 CLINICAL DATA:  History of  pneumothorax. EXAM: PORTABLE CHEST 1 VIEW COMPARISON:  Chest radiograph 07/06/2016. FINDINGS: Anterior cervical spinal fusion hardware. Monitoring leads overlie the patient. Right chest tube remains in place. Low lung volumes. Stable cardiac and mediastinal contours. Heterogeneous opacities right lower lung. No definite pleural effusion or pneumothorax. IMPRESSION: Right chest tube remains in place.  No definite pneumothorax. Persistent right hemidiaphragm elevation and suspected atelectasis. Electronically Signed   By: Lovey Newcomer M.D.   On: 07/07/2016 07:04   Dg Chest Port 1 View  Result Date: 07/06/2016 CLINICAL DATA:  Shortness of breath. EXAM: PORTABLE CHEST 1 VIEW COMPARISON:  Radiograph of July 05, 2016. FINDINGS: Stable cardiomediastinal silhouette. Stable position of right-sided pigtail catheter. No pneumothorax is noted. Hypoinflation of the lungs is noted. Left lower lobe atelectasis is noted. Stable right basilar atelectasis is noted as well. Bony thorax is unremarkable. IMPRESSION: Stable position of right-sided chest tube without pneumothorax. Bibasilar subsegmental atelectasis is noted. Electronically Signed   By: Marijo Conception, M.D.   On: 07/06/2016 13:15   Dg Chest Port 1 View  Result Date: 07/05/2016 CLINICAL DATA:  Insertion of right chest tube. EXAM: PORTABLE CHEST 1 VIEW COMPARISON:  July 05, 2016 FINDINGS: A new right chest tube is identified. The previously seen right-sided pneumothorax has resolved. Mild atelectasis at the left base. Mild atelectasis in the medial right base. No other interval changes or acute abnormalities. IMPRESSION: Placement of right chest tube with resolution of right-sided pneumothorax. Bibasilar atelectasis. Electronically Signed   By: Dorise Bullion III M.D  On: 07/05/2016 14:06    Assessment/Plan: Postop day #3: The patient is doing well neurologically  Pneumothorax: I appreciate critical care medicine/pulmonology's management of this.  LOS:  3 days     Ellis Koffler D 07/07/2016, 9:58 AM

## 2016-07-08 ENCOUNTER — Inpatient Hospital Stay (HOSPITAL_COMMUNITY): Payer: BLUE CROSS/BLUE SHIELD

## 2016-07-08 MED ORDER — SALINE SPRAY 0.65 % NA SOLN
1.0000 | NASAL | Status: DC | PRN
  Filled 2016-07-08: qty 44

## 2016-07-08 NOTE — Progress Notes (Signed)
Name: Kimberly [name] MRN: [id number] DOB: Dec 07, 1960    ADMISSION DATE:  07/04/2016 CONSULTATION DATE:  12/7  REFERRING MD :  Erin Hearing NP  CHIEF COMPLAINT:  Large spontaneous right PTX   BRIEF PATIENT DESCRIPTION:  55 year old female who was admitted on 12/6 for elective C spine surgery. Awoke after surgery c/o right sided chest discomfort involving the right chest and radiating to right shoulder blade. This was worse w/ deep breath and movement. By the AM of 12/7 she was having increased WOB and chest pain. O2 sats were 82% on room air. Initially medical services were consulted and a stat CXR was obtained. This showed a large right PTX for which we were consulted.   SIGNIFICANT EVENTS  Right Cook catheter placed 12/7>> removed 07/07/16        SUBJECTIVE:  All smiles/ still on 02 but ambulating   VITAL SIGNS: Temp:  [98 F (36.7 C)-98.7 F (37.1 C)] 98.4 F (36.9 C) (12/10 1417) Pulse Rate:  [62-98] 77 (12/10 1417) Resp:  [18-24] 24 (12/10 1417) BP: (119-144)/(73-92) 119/85 (12/10 1417) SpO2:  [90 %-98 %] 92 % (12/10 1417)  0 2  2lpm NP     PHYSICAL EXAMINATION: General:  Pleasant bf  resting comfortably supine on 2lpm   Neuro:  Awake, alert, no focal def.  HEENT:  C Collar in place. Dressing intact. MMM Cardiovascular:  Tachy RRR no MRG Lungs: clear bilaterally  Abdomen:  Soft, not tender. + bowel sounds  Musculoskeletal:  Equal st and bulk  Skin:  Warm and dry    Recent Labs Lab 07/04/16 1126  NA 137  K 3.6  CL 102  CO2 26  BUN 6  CREATININE 0.67  GLUCOSE 87    Recent Labs Lab 07/04/16 1126  HGB 13.3  HCT 40.0  WBC 11.4*  PLT 236   Dg Chest Port 1 View  Result Date: 07/08/2016 CLINICAL DATA:  History of chest tube placement. EXAM: PORTABLE CHEST 1 VIEW COMPARISON:  July 07, 2016 FINDINGS: No pneumothorax. Mild atelectasis in the left base. Opacity in the right infrahilar region persists but is smaller in the interval. No other  interval changes. IMPRESSION: 1. No pneumothorax after right chest removal yesterday. 2. Persistent right infrahilar opacity, improved in the interval. Recommend follow-up to complete resolution. Electronically Signed   By: Dorise Bullion III M.D   On: 07/08/2016 07:58   Dg Chest Port 1 View  Result Date: 07/07/2016 CLINICAL DATA:  Chest tube removal EXAM: PORTABLE CHEST 1 VIEW COMPARISON:  Portable exam 1455 hours compared to 0517 hours FINDINGS: Interval removal of pigtail RIGHT thoracostomy tube. Persistent RIGHT basilar atelectasis. Question tiny RIGHT apex pneumothorax versus artifact from superimposed support devices. Heart size stable. Lungs otherwise clear. Prior cervicothoracic fusion. IMPRESSION: Question tiny RIGHT apical pneumothorax post thoracostomy tube removal. Electronically Signed   By: Lavonia Dana M.D.   On: 07/07/2016 15:11   Dg Chest Port 1 View  Result Date: 07/07/2016 CLINICAL DATA:  History of pneumothorax. EXAM: PORTABLE CHEST 1 VIEW COMPARISON:  Chest radiograph 07/06/2016. FINDINGS: Anterior cervical spinal fusion hardware. Monitoring leads overlie the patient. Right chest tube remains in place. Low lung volumes. Stable cardiac and mediastinal contours. Heterogeneous opacities right lower lung. No definite pleural effusion or pneumothorax. IMPRESSION: Right chest tube remains in place.  No definite pneumothorax. Persistent right hemidiaphragm elevation and suspected atelectasis. Electronically Signed   By: Lovey Newcomer M.D.   On: 07/07/2016 07:04    ASSESSMENT /  PLAN:  1) Large right   PTX -now resolved  s/p Chest tube removal s recurrence    Plan PRN analgesia / dressing changes     2) acute Hypoxic respiratory failure 2/2 #1 Plan Wean O2 as able Mobilize as able with recent cervical surgery limitations encouaged IS/ ambulation  Should be able to wean off by d/c   3)  asthma Plan Cont Duoneb nebulzied rx  4) HTN  Plan Cont norvasc   5) s/p anterior  c-spine surg Plan Per neuro-surg       Christinia Gully, MD [facility] [place] [phone] After 5:30 PM or weekends, call [phone]

## 2016-07-08 NOTE — Progress Notes (Signed)
Physical Therapy Treatment Patient Details Name: Kimberly [name] MRN: [id number] DOB: 1960-09-20 Today's Date: 07/08/2016    History of Present Illness CERVICAL FOUR-FIVE, CERVICAL FIVE-SIX ANTERIOR CERVICAL DECOMPRESSION/DISCECTOMY/FUSION WITH REVISION OF CERVICAL THREE-FOUR PLATE (N/A). PMH includes but not necessarily limited to, COPD.  Found to have R pneumothorax transferred to stepdown now with chest tube/    PT Comments    Pt mobilizing well today, ambulated 300' with RW and min-guard A. O2 sats began 87% on 2L but were 92% on 2L by end of ambulation. Pt hopeful to wean off O2. Need to practice steps before d/c home. PT will continue to follow.   Follow Up Recommendations  Home health PT;Supervision for mobility/OOB     Equipment Recommendations  Rolling walker with 5" wheels    Recommendations for Other Services       Precautions / Restrictions Precautions Precautions: Cervical Required Braces or Orthoses: Cervical Brace Cervical Brace: Hard collar Restrictions Weight Bearing Restrictions: No    Mobility  Bed Mobility               General bed mobility comments: pt received in chair  Transfers Overall transfer level: Needs assistance Equipment used: Rolling walker (2 wheeled) Transfers: Sit to/from Stand Sit to Stand: Supervision         General transfer comment: pt unfamiliar with use of RW, vc's for hand placement and safe use  Ambulation/Gait Ambulation/Gait assistance: Min guard Ambulation Distance (Feet): 300 Feet Assistive device: Rolling walker (2 wheeled) Gait Pattern/deviations: Step-through pattern;Decreased stride length Gait velocity: decreased Gait velocity interpretation: <1.8 ft/sec, indicative of risk for recurrent falls General Gait Details: pt on 2L O2, began ambulation O2 sats 87% but was 92% by the end of ambulation. Worked on increasing pace. Pt liked RW, felt it made her much more stable. Practiced turning and  Scientific laboratory technician Rankin (Stroke Patients Only)       Balance Overall balance assessment: Needs assistance Sitting-balance support: Feet supported Sitting balance-Leahy Scale: Good     Standing balance support: No upper extremity supported Standing balance-Leahy Scale: Fair Standing balance comment: pt unsteady with unsupported standing                    Cognition Arousal/Alertness: Awake/alert Behavior During Therapy: WFL for tasks assessed/performed Overall Cognitive Status: Within Functional Limits for tasks assessed                      Exercises      General Comments General comments (skin integrity, edema, etc.): discussed proper posture as it relates to decreasing tension on neck      Pertinent Vitals/Pain Pain Assessment: 0-10 Pain Score: 1  Pain Location: neck Pain Descriptors / Indicators: Discomfort Pain Intervention(s): Monitored during session    Home Living                      Prior Function            PT Goals (current goals can now be found in the care plan section) Acute Rehab PT Goals Patient Stated Goal: home when ready PT Goal Formulation: With patient Time For Goal Achievement: 07/12/16 Potential to Achieve Goals: Good Progress towards PT goals: Progressing toward goals    Frequency    Min 5X/week      PT Plan Current plan remains appropriate  Co-evaluation             End of Session Equipment Utilized During Treatment: Gait belt;Cervical collar;Oxygen Activity Tolerance: Patient tolerated treatment well Patient left: in chair;with call bell/phone within reach     Time: 0818-0854 PT Time Calculation (min) (ACUTE ONLY): 36 min  Charges:  $Gait Training: 23-37 mins                    G Codes:     [place]  [place] 07/08/2016, 8:59 AM

## 2016-07-08 NOTE — Progress Notes (Signed)
Patient ID: Kimberly [name], female   DOB: [birth year], 55 y.o.   MRN: [id number] Subjective:  The patient is alert and pleasant. Her chest tube has been removed. She is ambulating the hallways. She continues to be hypoxic and is requiring nasal cannula oxygen.  Objective: Vital signs in last 24 hours: Temp:  [98 F (36.7 C)-98.7 F (37.1 C)] 98.7 F (37.1 C) (12/10 0818) Pulse Rate:  [62-98] 69 (12/10 0818) Resp:  [18-24] 21 (12/10 0818) BP: (124-144)/(78-92) 125/81 (12/10 0818) SpO2:  [90 %-98 %] 90 % (12/10 0818)  Intake/Output from previous day: 12/09 0701 - 12/10 0700 In: 484 [I.V.:484] Out: 1470 [Urine:1450; Drains:20] Intake/Output this shift: No intake/output data recorded.  Physical exam the patient is alert and pleasant. Her wound is healing well without hematoma or shift. She is moving all 4 extremities well.  Lab Results: No results for input(s): WBC, HGB, HCT, PLT in the last 72 hours. BMET No results for input(s): NA, K, CL, CO2, GLUCOSE, BUN, CREATININE, CALCIUM in the last 72 hours.  Studies/Results: Dg Chest Port 1 View  Result Date: 07/08/2016 CLINICAL DATA:  History of chest tube placement. EXAM: PORTABLE CHEST 1 VIEW COMPARISON:  July 07, 2016 FINDINGS: No pneumothorax. Mild atelectasis in the left base. Opacity in the right infrahilar region persists but is smaller in the interval. No other interval changes. IMPRESSION: 1. No pneumothorax after right chest removal yesterday. 2. Persistent right infrahilar opacity, improved in the interval. Recommend follow-up to complete resolution. Electronically Signed   By: Dorise Bullion III M.D   On: 07/08/2016 07:58   Dg Chest Port 1 View  Result Date: 07/07/2016 CLINICAL DATA:  Chest tube removal EXAM: PORTABLE CHEST 1 VIEW COMPARISON:  Portable exam 1455 hours compared to 0517 hours FINDINGS: Interval removal of pigtail RIGHT thoracostomy tube. Persistent RIGHT basilar atelectasis. Question tiny RIGHT apex  pneumothorax versus artifact from superimposed support devices. Heart size stable. Lungs otherwise clear. Prior cervicothoracic fusion. IMPRESSION: Question tiny RIGHT apical pneumothorax post thoracostomy tube removal. Electronically Signed   By: Lavonia Dana M.D.   On: 07/07/2016 15:11   Dg Chest Port 1 View  Result Date: 07/07/2016 CLINICAL DATA:  History of pneumothorax. EXAM: PORTABLE CHEST 1 VIEW COMPARISON:  Chest radiograph 07/06/2016. FINDINGS: Anterior cervical spinal fusion hardware. Monitoring leads overlie the patient. Right chest tube remains in place. Low lung volumes. Stable cardiac and mediastinal contours. Heterogeneous opacities right lower lung. No definite pleural effusion or pneumothorax. IMPRESSION: Right chest tube remains in place.  No definite pneumothorax. Persistent right hemidiaphragm elevation and suspected atelectasis. Electronically Signed   By: Lovey Newcomer M.D.   On: 07/07/2016 07:04   Dg Chest Port 1 View  Result Date: 07/06/2016 CLINICAL DATA:  Shortness of breath. EXAM: PORTABLE CHEST 1 VIEW COMPARISON:  Radiograph of July 05, 2016. FINDINGS: Stable cardiomediastinal silhouette. Stable position of right-sided pigtail catheter. No pneumothorax is noted. Hypoinflation of the lungs is noted. Left lower lobe atelectasis is noted. Stable right basilar atelectasis is noted as well. Bony thorax is unremarkable. IMPRESSION: Stable position of right-sided chest tube without pneumothorax. Bibasilar subsegmental atelectasis is noted. Electronically Signed   By: Marijo Conception, M.D.   On: 07/06/2016 13:15    Assessment/Plan: Postop day #4: She is doing well neurologically.  Pneumothorax, hypoxia: The patient is making progress. Her chest tube has been removed.  LOS: 4 days     Lam Bjorklund D 07/08/2016, 9:23 AM

## 2016-07-09 DIAGNOSIS — J45909 Unspecified asthma, uncomplicated: Secondary | ICD-10-CM

## 2016-07-09 NOTE — Progress Notes (Signed)
Occupational Therapy Treatment Patient Details Name: Kimberly [name] MRN: [id number] DOB: Nov 22, 1960 Today's Date: 07/09/2016    History of present illness CERVICAL FOUR-FIVE, CERVICAL FIVE-SIX ANTERIOR CERVICAL DECOMPRESSION/DISCECTOMY/FUSION WITH REVISION OF CERVICAL THREE-FOUR PLATE (N/A). PMH includes but not necessarily limited to, COPD.  Found to have R pneumothorax transferred to stepdown now with chest tube/   OT comments  Pt demonstrates good awareness of cervical precautions and is supervision for ADLs.  Discussed options for tub seat.  02 sats remained >94% on RA   Follow Up Recommendations  No OT follow up;Supervision/Assistance - 24 hour    Equipment Recommendations  3 in 1 bedside commode    Recommendations for Other Services      Precautions / Restrictions Precautions Precautions: Cervical Required Braces or Orthoses: Cervical Brace Cervical Brace: Hard collar       Mobility Bed Mobility                  Transfers Overall transfer level: Modified independent Equipment used: Rolling walker (2 wheeled);None Transfers: Sit to/from Stand Sit to Stand: Modified independent (Device/Increase time)         General transfer comment: Pt demonstrates good safety awareness     Balance Overall balance assessment: Needs assistance Sitting-balance support: Feet supported Sitting balance-Leahy Scale: Good     Standing balance support: No upper extremity supported;During functional activity Standing balance-Leahy Scale: Good                     ADL Overall ADL's : Needs assistance/impaired     Grooming: Wash/dry hands;Wash/dry face;Oral care;Brushing hair;Supervision/safety;Standing   Upper Body Bathing: Set up;Sitting   Lower Body Bathing: Supervison/ safety;Sit to/from stand   Upper Body Dressing : Set up;Supervision/safety;Sitting   Lower Body Dressing: Supervision/safety;Sit to/from stand   Toilet Transfer:  Supervision/safety;Ambulation;Comfort height toilet;Grab bars   Toileting- Clothing Manipulation and Hygiene: Supervision/safety;Sit to/from stand   Tub/ Shower Transfer: Supervision/safety;Ambulation Tub/Shower Transfer Details (indicate cue type and reason): discussed options for tub seat  Functional mobility during ADLs: Supervision/safety General ADL Comments: Pt verbalizes understanding of how to manage cervical collar.  She demonstrates good understanding of cervical precautions       Vision                     Perception     Praxis      Cognition   Behavior During Therapy: WFL for tasks assessed/performed Overall Cognitive Status: Within Functional Limits for tasks assessed                       Extremity/Trunk Assessment               Exercises     Shoulder Instructions       General Comments      Pertinent Vitals/ Pain       Pain Assessment: Faces Faces Pain Scale: No hurt  Home Living                                          Prior Functioning/Environment              Frequency  Min 2X/week        Progress Toward Goals  OT Goals(current goals can now be found in the care plan section)  Progress towards OT goals: Progressing toward goals  Plan Discharge plan remains appropriate    Co-evaluation                 End of Session Equipment Utilized During Treatment: Rolling walker;Cervical collar   Activity Tolerance Patient tolerated treatment well   Patient Left in chair;with call bell/phone within reach;with family/visitor present   Nurse Communication Mobility status        Time: [id number] OT Time Calculation (min): 18 min  Charges: OT General Charges $OT Visit: 1 Procedure OT Treatments $Self Care/Home Management : 8-22 mins  Anais Koenen M 07/09/2016, 4:06 PM

## 2016-07-09 NOTE — Care Management Note (Signed)
Case Management Note  Patient Details  Name: Kimberly [name] MRN: [id number] Date of Birth: 10/02/1960  Subjective/Objective:    After c spine surgery developed large right ptx , chest tube place, removed, conts on oxygen, trying to wean oxygen to off, if not able may need home oxygen.  She will also need HHPT with rolling walker per pt eval recs.  NCM will cont to follow.                Action/Plan:   Expected Discharge Date:                  Expected Discharge Plan:  [place]  In-House Referral:     Discharge planning Services  CM Consult  Post Acute Care Choice:    Choice offered to:     DME Arranged:    DME Agency:     HH Arranged:    [place] Agency:     Status of Service:  In process, will continue to follow  If discussed at Long Length of Stay Meetings, dates discussed:    Additional Comments:  Zenon Mayo, RN 07/09/2016, 7:54 PM

## 2016-07-09 NOTE — Progress Notes (Signed)
Physical Therapy Treatment Patient Details Name: Kimberly [name] MRN: [id number] DOB: [birth year] Today's Date: 07/09/2016    History of Present Illness CERVICAL FOUR-FIVE, CERVICAL FIVE-SIX ANTERIOR CERVICAL DECOMPRESSION/DISCECTOMY/FUSION WITH REVISION OF CERVICAL THREE-FOUR PLATE (N/A). PMH includes but not necessarily limited to, COPD.  Found to have R pneumothorax transferred to stepdown now with chest tube/    PT Comments    Pt ambulating well on RA with O2 sats remaining above 92% over 400' with RW. Pt ascended and descended 4 stairs with rails and supervision. Is hopeful to go home later today if sats remain stable. PT will continue to follow.   Follow Up Recommendations  Home health PT;Supervision for mobility/OOB     Equipment Recommendations  Rolling walker with 5" wheels    Recommendations for Other Services       Precautions / Restrictions Precautions Precautions: Cervical Required Braces or Orthoses: Cervical Brace Cervical Brace: Hard collar Restrictions Weight Bearing Restrictions: No    Mobility  Bed Mobility Overal bed mobility: Modified Independent Bed Mobility: Rolling;Sidelying to Sit Rolling: Modified independent (Device/Increase time) Sidelying to sit: Modified independent (Device/Increase time)       General bed mobility comments: pt able to get to EOB safely without assist  Transfers Overall transfer level: Modified independent Equipment used: Rolling walker (2 wheeled) Transfers: Sit to/from Stand Sit to Stand: Modified independent (Device/Increase time)         General transfer comment: pt with safe hand placement with RW today. No assist needed for multiple transfer surfaces  Ambulation/Gait Ambulation/Gait assistance: Supervision Ambulation Distance (Feet): 400 Feet Assistive device: Rolling walker (2 wheeled) Gait Pattern/deviations: Step-through pattern;Decreased stride length Gait velocity: decreased Gait velocity  interpretation: Below normal speed for age/gender General Gait Details: pt ambulated on RA, O2 sats remained 92% and above. Continues to have some diffculty increasing gait speed and is very guarded but is safe with RW.    Stairs Stairs: Yes   Stair Management: Two rails;Step to pattern;Forwards Number of Stairs: 4 General stair comments: pt comfortable on stairs and VSS  Wheelchair Mobility    Modified Rankin (Stroke Patients Only)       Balance Overall balance assessment: Needs assistance Sitting-balance support: Feet supported Sitting balance-Leahy Scale: Good     Standing balance support: No upper extremity supported Standing balance-Leahy Scale: Fair Standing balance comment: pt limited by cervical collar, educated on turning feet to be able to increase peripheral field                    Cognition Arousal/Alertness: Awake/alert Behavior During Therapy: WFL for tasks assessed/performed Overall Cognitive Status: Within Functional Limits for tasks assessed                      Exercises      General Comments General comments (skin integrity, edema, etc.): HR 105 bpm with ambulation      Pertinent Vitals/Pain Pain Assessment: Faces Faces Pain Scale: Hurts a little bit Pain Location: neck Pain Descriptors / Indicators: Discomfort Pain Intervention(s): Limited activity within patient's tolerance;Monitored during session    Home Living                      Prior Function            PT Goals (current goals can now be found in the care plan section) Acute Rehab PT Goals Patient Stated Goal: home when ready PT Goal Formulation: With patient Time For Goal  Achievement: 07/12/16 Potential to Achieve Goals: Good Progress towards PT goals: Progressing toward goals    Frequency    Min 5X/week      PT Plan Current plan remains appropriate    Co-evaluation             End of Session Equipment Utilized During Treatment: Gait  belt;Cervical collar Activity Tolerance: Patient tolerated treatment well Patient left: in chair;with call bell/phone within reach;with family/visitor present     Time: 1006-1026 PT Time Calculation (min) (ACUTE ONLY): 20 min  Charges:  $Gait Training: 8-22 mins                    G Codes:     Leighton Roach, PT  Acute Rehab Services  [place] 07/09/2016, 11:29 AM

## 2016-07-09 NOTE — Progress Notes (Signed)
Name: Kimberly [name] MRN: [id number] DOB: [birth year]    ADMISSION DATE:  07/04/2016 CONSULTATION DATE:  12/7  REFERRING MD :  Erin Hearing NP  CHIEF COMPLAINT:  Large spontaneous right PTX   BRIEF PATIENT DESCRIPTION:  55 year old female who was admitted on 12/6 for elective C spine surgery. Awoke after surgery c/o right sided chest discomfort involving the right chest and radiating to right shoulder blade. This was worse w/ deep breath and movement. By the AM of 12/7 she was having increased WOB and chest pain. O2 sats were 82% on room air. Initially medical services were consulted and a stat CXR was obtained. This showed a large right PTX for which we were consulted.   SIGNIFICANT EVENTS  Right Cook catheter placed 12/7>> removed 07/07/16    SUBJECTIVE:  No distress.   VITAL SIGNS: Temp:  [98.1 F (36.7 C)-98.7 F (37.1 C)] 98.4 F (36.9 C) (12/11 0723) Pulse Rate:  [66-83] 66 (12/11 0723) Resp:  [18-24] 22 (12/11 0723) BP: (119-152)/(73-91) 141/91 (12/11 0723) SpO2:  [90 %-95 %] 90 % (12/11 0723)  Room air   PHYSICAL EXAMINATION: General:  Pleasant bf  resting comfortably in chair   Neuro:  Awake, alert, no focal def.  HEENT:  C Collar in place. Dressing intact. MMM Cardiovascular:  Tachy RRR no MRG Lungs: fine crackles no accessory use  Abdomen:  Soft, not tender. + bowel sounds  Musculoskeletal:  Equal st and bulk  Skin:  Warm and dry    Recent Labs Lab 07/04/16 1126  NA 137  K 3.6  CL 102  CO2 26  BUN 6  CREATININE 0.67  GLUCOSE 87    Recent Labs Lab 07/04/16 1126  HGB 13.3  HCT 40.0  WBC 11.4*  PLT 236   Dg Chest Port 1 View  Result Date: 07/08/2016 CLINICAL DATA:  History of chest tube placement. EXAM: PORTABLE CHEST 1 VIEW COMPARISON:  July 07, 2016 FINDINGS: No pneumothorax. Mild atelectasis in the left base. Opacity in the right infrahilar region persists but is smaller in the interval. No other interval changes. IMPRESSION: 1. No  pneumothorax after right chest removal yesterday. 2. Persistent right infrahilar opacity, improved in the interval. Recommend follow-up to complete resolution. Electronically Signed   By: Dorise Bullion III M.D   On: 07/08/2016 07:58   Dg Chest Port 1 View  Result Date: 07/07/2016 CLINICAL DATA:  Chest tube removal EXAM: PORTABLE CHEST 1 VIEW COMPARISON:  Portable exam 1455 hours compared to 0517 hours FINDINGS: Interval removal of pigtail RIGHT thoracostomy tube. Persistent RIGHT basilar atelectasis. Question tiny RIGHT apex pneumothorax versus artifact from superimposed support devices. Heart size stable. Lungs otherwise clear. Prior cervicothoracic fusion. IMPRESSION: Question tiny RIGHT apical pneumothorax post thoracostomy tube removal. Electronically Signed   By: Lavonia Dana M.D.   On: 07/07/2016 15:11    ASSESSMENT / PLAN:  1) Large right   PTX (resolved s/p CT placement and subsequent removal) Plan PRN analgesia / dressing changes  Lifting restrictions no different than C spine limitations   2) acute Hypoxic respiratory failure 2/2 #1 Plan Wean O2 as able Mobilize as able with recent cervical surgery limitations encouaged IS/ ambulation  Should be able to wean off by d/c   3)  asthma Plan Cont Duoneb nebulzied rx  4) HTN  Plan Cont norvasc   5) s/p anterior c-spine surg Plan Per neuro-surg   Erick Colace ACNP-BC [place] Pager # [phone] OR # [phone]  if no answer  Attending Note:  55 year old female with large right sided PTX post back surgery that was treated via CT placement and subsequent removal.  On exam, no SOB, feels better.  I reviewed CXR myself, CT is out and PTX resolved.  Discussed with PCCM-NP.  PTX:  - PRN narcs for pain.  - Dressing change.  Hypoxemia:  - Wean O2 to off.  - If unable to wean O2 off then will need an ambulatory desat study prior to discharge for home O2.  Asthma:  - Duonebs  - PRN  albuterol.  Pulmonary infiltrate:  - F/u CXR in 1 wk and f/u with primary.  PCCM will sign off, please call back if needed.  Patient seen and examined, agree with above note.  I dictated the care and orders written for this patient under my direction.  Rush Farmer, MD [phone]

## 2016-07-09 NOTE — Progress Notes (Signed)
Patient ID: Kimberly [name], female   DOB: 04-17-1961, 55 y.o.   MRN: [id number] Doing great. No com[lains. Wound dry. To get c spine rays.

## 2016-07-10 NOTE — Care Management Note (Addendum)
Case Management Note  Patient Details  Name: Kimberly [name] MRN: [id number] Date of Birth: [birth year]  Subjective/Objective:  S/p c spine surgery developed large right ptx, she is for dc today, will need HHRN, aide and resp care,  Patient chose [facility], referral made to [facility] with [facility] , she states they can take referral but the [facility] will do the resp  Care for patient and patient's insurance will not likely cover an aide, so they will be able to do [facility] .  Also patient will need rolling walker and 3 n 1, referral given to [facility] with [facility], this will be delivered to home address.                  Action/Plan:   Expected Discharge Date:                  Expected Discharge Plan:  [place]  In-House Referral:     Discharge planning Services  CM Consult  Post Acute Care Choice:  Durable Medical Equipment, Home Health Choice offered to:  Patient  DME Arranged:  3-N-1, Walker rolling DME Agency:  [place]:  RN, PT, Nurse's Aide [place] Agency:  [place]  Status of Service:  Completed, signed off  If discussed at [place] of Stay Meetings, dates discussed:    Additional Comments:  Zenon Mayo, RN 07/10/2016, 10:31 AM

## 2016-07-10 NOTE — Discharge Summary (Signed)
Physician Discharge Summary  Patient ID: Kimberly [name] MRN: [id number] DOB/AGE: June 17, 1961 55 y.o.  Admit date: 07/04/2016 Discharge date: 07/10/2016  Admission Diagnoses: C45,46 stenosis. Copd. S/p cervical fusion Discharge Diagnoses:  Principal Problem:   Acute respiratory failure with hypoxia (HCC) Active Problems:   HTN (hypertension)   Cervical stenosis of spinal canal   Asthma   Acute chest wall pain   GERD (gastroesophageal reflux disease)   Spontaneous pneumothorax   Spontaneous tension pneumothorax   Discharged Condition: no pain, no weakness  Hospital Course: acdf 45,56. Stable. 24 hours later developed a spontaneous pneumothorax which required a chest tube  Consults thoracis surgery  Significant Diagnostic Studies: {myelogram  Treatments: acdf 45,56. Chest tube  Discharge Exam: Blood pressure (!) 141/93, pulse 85, temperature 98.3 F (36.8 C), temperature source Oral, resp. rate (!) 24, height 5\' 1"  (1.549 m), weight 74.4 kg (164 lb 0.4 oz), SpO2 93 %. Stable. Wound dry , no respiratory difficulties. No weakness  Disposition: home       Signed: Floyce Stakes 07/10/2016, 10:00 AM

## 2016-07-10 NOTE — Progress Notes (Signed)
Pt education completed to include future appointments, current prescriptions and medications, and doctors discharge instructions. Pt alert and oriented, vital signs stable. Pt discharged with nursing staff via wheel chair. Temp: 98.3 F (36.8 C) (12/12 0730) Temp Source: Oral (12/12 0730) BP: 141/93 (12/12 0730) Pulse Rate: 85 (12/12 0730)  Kimberly [name] 07/10/2016 11:16 AM

## 2016-08-13 DIAGNOSIS — J40 Bronchitis, not specified as acute or chronic: Secondary | ICD-10-CM | POA: Diagnosis not present

## 2016-08-13 DIAGNOSIS — R195 Other fecal abnormalities: Secondary | ICD-10-CM | POA: Diagnosis not present

## 2016-08-13 DIAGNOSIS — J449 Chronic obstructive pulmonary disease, unspecified: Secondary | ICD-10-CM | POA: Diagnosis not present

## 2016-08-13 DIAGNOSIS — R0789 Other chest pain: Secondary | ICD-10-CM | POA: Diagnosis not present

## 2016-08-17 ENCOUNTER — Ambulatory Visit: Payer: BLUE CROSS/BLUE SHIELD

## 2016-08-25 ENCOUNTER — Ambulatory Visit: Payer: BLUE CROSS/BLUE SHIELD

## 2016-08-27 ENCOUNTER — Other Ambulatory Visit: Payer: BLUE CROSS/BLUE SHIELD

## 2016-08-27 ENCOUNTER — Encounter: Payer: Self-pay | Admitting: Emergency Medicine

## 2016-08-27 ENCOUNTER — Ambulatory Visit (INDEPENDENT_AMBULATORY_CARE_PROVIDER_SITE_OTHER): Payer: BLUE CROSS/BLUE SHIELD | Admitting: Emergency Medicine

## 2016-08-27 VITALS — BP 112/70 | HR 106 | Ht 61.0 in | Wt 149.2 lb

## 2016-08-27 DIAGNOSIS — J9383 Other pneumothorax: Secondary | ICD-10-CM | POA: Diagnosis not present

## 2016-08-27 DIAGNOSIS — J449 Chronic obstructive pulmonary disease, unspecified: Secondary | ICD-10-CM

## 2016-08-27 DIAGNOSIS — R195 Other fecal abnormalities: Secondary | ICD-10-CM | POA: Diagnosis not present

## 2016-08-27 DIAGNOSIS — J45909 Unspecified asthma, uncomplicated: Secondary | ICD-10-CM | POA: Diagnosis not present

## 2016-08-27 MED ORDER — BUDESONIDE-FORMOTEROL FUMARATE 160-4.5 MCG/ACT IN AERO
2.0000 | INHALATION_SPRAY | Freq: Two times a day (BID) | RESPIRATORY_TRACT | 5 refills | Status: DC
Start: 2016-08-27 — End: 2017-02-17

## 2016-08-27 NOTE — Assessment & Plan Note (Signed)
Right pneumothorax following surgery in mid December. Cause unclear. Check alpha-1 antitrypsin as above. Imaging does not appear to show any blebs. Chest x-ray today to ensure full resolution.

## 2016-08-27 NOTE — Patient Instructions (Addendum)
We will perform blood work today Continue your Symbicort 2 times a day for now. We will restart We will repeat your pulmonary function testing.  CXR today.  Follow with Dr Lamonte Sakai next available after your testing to review.

## 2016-08-27 NOTE — Assessment & Plan Note (Signed)
Slight curve to the flow volume loop from 2015. She does have clinical symptoms consistent. She feels that she misses her Symbicort. I'll restart now. We will repeat her pulmonary function testing. Check alpha-1 antitrypsin

## 2016-08-27 NOTE — Progress Notes (Signed)
Subjective:    Patient ID: Kimberly [name], female    DOB: 05/18/1961, 56 y.o.   MRN: [id number]  HPI 56 yo never smoker with a hx of HTN, depression, cervical stenosis for which she underwent cervical fusion in December 2017. That procedure was complicated by a spontaneous thorax on the right requiring chest tube. She carries a hx of obstructive lung disease, chronic bronchitis, that was made clinically. She had normal spirometry 02/15/14, but w curved to F/V loop. She is on Symbicort, ran out about a month ago. She feels that she misses it She still has L neck and B shoulder pain. She had a chest tube in place for 2 days. She feels that she is more dyspneic since the episode, less able to do her daily activities. No focal chest pain.  She has some cough, had a recent URI / possible flu    Review of Systems  Constitutional: Negative.  Negative for fever and unexpected weight change.  HENT: Positive for congestion and rhinorrhea. Negative for dental problem, ear pain, nosebleeds, postnasal drip, sinus pressure, sneezing, sore throat and trouble swallowing.   Eyes: Negative.  Negative for redness and itching.  Respiratory: Positive for cough, chest tightness and shortness of breath. Negative for wheezing.   Cardiovascular: Negative.  Negative for palpitations and leg swelling.  Gastrointestinal: Negative.  Negative for nausea and vomiting.  Genitourinary: Negative.  Negative for dysuria.  Musculoskeletal: Negative.  Negative for joint swelling.  Skin: Negative.  Negative for rash.  Neurological: Negative.  Negative for headaches.  Hematological: Negative.  Does not bruise/bleed easily.  Psychiatric/Behavioral: Negative.  Negative for dysphoric mood. The patient is not nervous/anxious.    Past Medical History:  Diagnosis Date  . Anxiety    takes alprazolam - rare use   . Arthritis    cervical spondylosis   . Balance problem   . Brain cyst   . Chronic bronchitis with COPD (chronic  obstructive pulmonary disease) ([place])   . COPD (chronic obstructive pulmonary disease) ([place])   . Depression   . Difficult intubation    Small mouth opening, limited neck flexion, very anterior   . GERD (gastroesophageal reflux disease)    pt. reports that its better, no meds in use at this time- 2015  . Herpes   . History of kidney stones   . History of pneumonia   . Hypertension    pt. doesn't see cardiologist, followed for HTN by Dr. Gerarda Fraction  . Inflammation of shoulder joint   . PONV (postoperative nausea and vomiting)   . Recurrent falls   . Shortness of breath dyspnea   . Urinary frequency      Family History  Problem Relation Age of Onset  . Hypertension Father   . Hypertension Other      Social History   Social History  . Marital status: Married    Spouse name: N/A  . Number of children: N/A  . Years of education: N/A   Occupational History  . Not on file.   Social History Main Topics  . Smoking status: Never Smoker  . Smokeless tobacco: Never Used  . Alcohol use No  . Drug use: No  . Sexual activity: Yes    Birth control/ protection: Surgical   Other Topics Concern  . Not on file   Social History Narrative  . No narrative on file     Allergies  Allergen Reactions  . Shellfish Allergy Anaphylaxis and Hives    "Swells  throat up"  . Peanut-Containing Drug Products Hives  . Codeine Other (See Comments)    jitters     Outpatient Medications Prior to Visit  Medication Sig Dispense Refill  . acyclovir (ZOVIRAX) 400 MG tablet Take 400 mg by mouth daily.     Marland Kitchen amLODipine (NORVASC) 5 MG tablet Take 5 mg by mouth daily with breakfast.     . aspirin EC 81 MG tablet Take 81 mg by mouth daily.    . budesonide-formoterol (SYMBICORT) 160-4.5 MCG/ACT inhaler Inhale 2 puffs into the lungs 2 (two) times daily.    . diazepam (VALIUM) 5 MG tablet Take 5 mg by mouth every 6 (six) hours as needed for anxiety.    Marland Kitchen escitalopram (LEXAPRO) 10 MG tablet Take 10 mg by mouth  daily.    Marland Kitchen estradiol (ESTRACE) 1 MG tablet Take 1 mg by mouth daily with breakfast.     . gabapentin (NEURONTIN) 100 MG capsule TWO CAPSULES BY MOUTH EVERY 8 HOURS (Patient taking differently: Take 200 mg by mouth every 8 (eight) hours. ) 90 capsule 5  . methocarbamol (ROBAXIN) 500 MG tablet Take 1 tablet (500 mg total) by mouth 2 (two) times daily. 60 tablet 5  . oxybutynin (DITROPAN-XL) 5 MG 24 hr tablet Take 5 mg by mouth at bedtime.    Marland Kitchen oxyCODONE-acetaminophen (PERCOCET/ROXICET) 5-325 MG tablet Take by mouth every 4 (four) hours as needed for severe pain.    . pantoprazole (PROTONIX) 20 MG tablet Take 20 mg by mouth daily.    . Vitamin D, Cholecalciferol, 1000 units CAPS Take 1,000 Units by mouth daily.    . clotrimazole (LOTRIMIN) 1 % cream Apply 1 application topically 2 (two) times daily.     No facility-administered medications prior to visit.         Objective:   Physical Exam  Vitals:   08/27/16 1016  BP: 112/70  Pulse: (!) 106  SpO2: 97%  Weight: 149 lb 3.2 oz (67.7 kg)  Height: 5\' 1"  (1.549 m)    Gen: Pleasant, well-nourished, in no distress,  normal affect  ENT: No lesions,  mouth clear,  oropharynx clear, no postnasal drip  Neck: No JVD, no TMG, no carotid bruits  Lungs: No use of accessory muscles, slightly increased coarseness R base, otherwise clear B   Cardiovascular: RRR, heart sounds normal, no murmur or gallops, no peripheral edema  Musculoskeletal: No deformities, no cyanosis or clubbing  Neuro: alert, non focal  Skin: Warm, no lesions or rashes      Assessment & Plan:  Asthma Slight curve to the flow volume loop from 2015. She does have clinical symptoms consistent. She feels that she misses her Symbicort. I'll restart now. We will repeat her pulmonary function testing. Check alpha-1 antitrypsin  Spontaneous pneumothorax Right pneumothorax following surgery in mid December. Cause unclear. Check alpha-1 antitrypsin as above. Imaging does not  appear to show any blebs. Chest x-ray today to ensure full resolution.  Baltazar Apo, MD, PhD 08/27/2016, 10:43 AM [facility] [phone] or if no answer [phone]

## 2016-08-29 DIAGNOSIS — R030 Elevated blood-pressure reading, without diagnosis of hypertension: Secondary | ICD-10-CM | POA: Diagnosis not present

## 2016-08-29 DIAGNOSIS — M5412 Radiculopathy, cervical region: Secondary | ICD-10-CM | POA: Diagnosis not present

## 2016-08-29 DIAGNOSIS — Z6828 Body mass index (BMI) 28.0-28.9, adult: Secondary | ICD-10-CM | POA: Diagnosis not present

## 2016-08-31 LAB — ALPHA-1 ANTITRYPSIN PHENOTYPE: A1 ANTITRYPSIN: 175 mg/dL (ref 83–199)

## 2016-09-01 ENCOUNTER — Ambulatory Visit: Payer: BLUE CROSS/BLUE SHIELD

## 2016-09-12 ENCOUNTER — Ambulatory Visit
Admission: RE | Admit: 2016-09-12 | Discharge: 2016-09-12 | Disposition: A | Discharge status: Home / Self Care | Payer: Medicare Other | Source: Ambulatory Visit | Attending: Nurse Practitioner | Admitting: Nurse Practitioner

## 2016-09-12 DIAGNOSIS — G44229 Chronic tension-type headache, not intractable: Secondary | ICD-10-CM | POA: Diagnosis not present

## 2016-09-12 DIAGNOSIS — G3189 Other specified degenerative diseases of nervous system: Secondary | ICD-10-CM | POA: Insufficient documentation

## 2016-09-12 DIAGNOSIS — R51 Headache: Secondary | ICD-10-CM | POA: Diagnosis not present

## 2016-09-12 MED ORDER — GADOBENATE DIMEGLUMINE 529 MG/ML IV SOLN
20.0000 mL | Freq: Once | INTRAVENOUS | Status: AC | PRN
  Administered 2016-09-12: 14 mL via INTRAVENOUS

## 2016-09-17 ENCOUNTER — Ambulatory Visit: Payer: BLUE CROSS/BLUE SHIELD | Admitting: Emergency Medicine

## 2016-09-18 DIAGNOSIS — R413 Other amnesia: Secondary | ICD-10-CM | POA: Diagnosis not present

## 2016-09-18 DIAGNOSIS — G44229 Chronic tension-type headache, not intractable: Secondary | ICD-10-CM | POA: Diagnosis not present

## 2016-09-18 DIAGNOSIS — M5030 Other cervical disc degeneration, unspecified cervical region: Secondary | ICD-10-CM | POA: Diagnosis not present

## 2016-09-24 DIAGNOSIS — K219 Gastro-esophageal reflux disease without esophagitis: Secondary | ICD-10-CM | POA: Diagnosis not present

## 2016-09-24 DIAGNOSIS — L739 Follicular disorder, unspecified: Secondary | ICD-10-CM | POA: Diagnosis not present

## 2016-09-24 DIAGNOSIS — F325 Major depressive disorder, single episode, in full remission: Secondary | ICD-10-CM | POA: Diagnosis not present

## 2016-09-24 DIAGNOSIS — I10 Essential (primary) hypertension: Secondary | ICD-10-CM | POA: Diagnosis not present

## 2016-10-03 ENCOUNTER — Encounter: Payer: Self-pay | Admitting: Emergency Medicine

## 2016-10-03 ENCOUNTER — Ambulatory Visit (INDEPENDENT_AMBULATORY_CARE_PROVIDER_SITE_OTHER): Payer: Medicare Other | Admitting: Emergency Medicine

## 2016-10-03 DIAGNOSIS — J45909 Unspecified asthma, uncomplicated: Secondary | ICD-10-CM

## 2016-10-03 DIAGNOSIS — J449 Chronic obstructive pulmonary disease, unspecified: Secondary | ICD-10-CM | POA: Diagnosis not present

## 2016-10-03 LAB — PULMONARY FUNCTION TEST
DL/VA % PRED: 114 %
DL/VA: 5.06 ml/min/mmHg/L
DLCO COR: 15.01 ml/min/mmHg
DLCO cor % pred: 74 %
DLCO unc % pred: 72 %
DLCO unc: 14.67 ml/min/mmHg
FEF 25-75 Post: 2.13 L/sec
FEF 25-75 Pre: 1.74 L/sec
FEF2575-%CHANGE-POST: 22 %
FEF2575-%Pred-Post: 105 %
FEF2575-%Pred-Pre: 85 %
FEV1-%Change-Post: 3 %
FEV1-%PRED-PRE: 86 %
FEV1-%Pred-Post: 89 %
FEV1-Post: 1.74 L
FEV1-Pre: 1.69 L
FEV1FVC-%CHANGE-POST: 6 %
FEV1FVC-%Pred-Pre: 102 %
FEV6-%Change-Post: -3 %
FEV6-%PRED-PRE: 87 %
FEV6-%Pred-Post: 83 %
FEV6-PRE: 2.06 L
FEV6-Post: 1.97 L
FEV6FVC-%Change-Post: 0 %
FEV6FVC-%PRED-PRE: 103 %
FEV6FVC-%Pred-Post: 102 %
FVC-%Change-Post: -3 %
FVC-%PRED-POST: 81 %
FVC-%PRED-PRE: 84 %
FVC-POST: 1.98 L
FVC-PRE: 2.06 L
POST FEV6/FVC RATIO: 100 %
PRE FEV6/FVC RATIO: 100 %
Post FEV1/FVC ratio: 88 %
Pre FEV1/FVC ratio: 82 %
RV % PRED: 80 %
RV: 1.4 L
TLC % pred: 76 %
TLC: 3.52 L

## 2016-10-03 MED ORDER — ALBUTEROL SULFATE HFA 108 (90 BASE) MCG/ACT IN AERS: 2.0000 | INHALATION_SPRAY | RESPIRATORY_TRACT | 2 refills | Status: DC | PRN

## 2016-10-03 NOTE — Progress Notes (Signed)
Subjective:    Patient ID: Kimberly [name], female    DOB: [birth year], 56 y.o.   MRN: [id number]  HPI 56 yo never smoker with a hx of HTN, depression, cervical stenosis for which she underwent cervical fusion in December 2017. That procedure was complicated by a spontaneous thorax on the right requiring chest tube. She carries a hx of obstructive lung disease, chronic bronchitis, that was made clinically. She had normal spirometry 02/15/14, but w curved to F/V loop. She is on Symbicort, ran out about a month ago. She feels that she misses it She still has L neck and B shoulder pain. She had a chest tube in place for 2 days. She feels that she is more dyspneic since the episode, less able to do her daily activities. No focal chest pain.  She has some cough, had a recent URI / possible flu  ROV 10/03/16 -- this is a follow up for suspected asthma, cough. We repeated her pulmonary function testing on 3/7 and I have personally reviewed. This shows normal wall spirometry with some slight curve to her flow volume loop consistent with possible obstruction. There was no bronchodilator response. Her total lung capacity was slightly decreased consistent with coexisting restriction. Her diffusion capacity was decreased and corrected to the normal range when adjusted for alveolar volume. Alpha-1 antitrypsin genotype is MM. We restarted Symbicort, she believes that it has helped her. Taking 1 puff bid. She still has some exertional SOB with chores. No wheeze or cough.     Review of Systems  Constitutional: Negative.  Negative for fever and unexpected weight change.  HENT: Positive for congestion and rhinorrhea. Negative for dental problem, ear pain, nosebleeds, postnasal drip, sinus pressure, sneezing, sore throat and trouble swallowing.   Eyes: Negative.  Negative for redness and itching.  Respiratory: Positive for cough, chest tightness and shortness of breath. Negative for wheezing.   Cardiovascular:  Negative.  Negative for palpitations and leg swelling.  Gastrointestinal: Negative.  Negative for nausea and vomiting.  Genitourinary: Negative.  Negative for dysuria.  Musculoskeletal: Negative.  Negative for joint swelling.  Skin: Negative.  Negative for rash.  Neurological: Negative.  Negative for headaches.  Hematological: Negative.  Does not bruise/bleed easily.  Psychiatric/Behavioral: Negative.  Negative for dysphoric mood. The patient is not nervous/anxious.    Past Medical History:  Diagnosis Date  . Anxiety    takes alprazolam - rare use   . Arthritis    cervical spondylosis   . Balance problem   . Brain cyst   . Chronic bronchitis with COPD (chronic obstructive pulmonary disease) ([place])   . COPD (chronic obstructive pulmonary disease) ([place])   . Depression   . Difficult intubation    Small mouth opening, limited neck flexion, very anterior   . GERD (gastroesophageal reflux disease)    pt. reports that its better, no meds in use at this time- 2015  . Herpes   . History of kidney stones   . History of pneumonia   . Hypertension    pt. doesn't see cardiologist, followed for HTN by Dr. Gerarda Fraction  . Inflammation of shoulder joint   . PONV (postoperative nausea and vomiting)   . Recurrent falls   . Shortness of breath dyspnea   . Urinary frequency      Family History  Problem Relation Age of Onset  . Hypertension Father   . Hypertension Other      Social History   Social History  . Marital status:  Married    Spouse name: N/A  . Number of children: N/A  . Years of education: N/A   Occupational History  . Not on file.   Social History Main Topics  . Smoking status: Never Smoker  . Smokeless tobacco: Never Used  . Alcohol use No  . Drug use: No  . Sexual activity: Yes    Birth control/ protection: Surgical   Other Topics Concern  . Not on file   Social History Narrative  . No narrative on file     Allergies  Allergen Reactions  . Shellfish Allergy  Anaphylaxis and Hives    "Swells throat up"  . Peanut-Containing Drug Products Hives  . Codeine Other (See Comments)    jitters     Outpatient Medications Prior to Visit  Medication Sig Dispense Refill  . acyclovir (ZOVIRAX) 400 MG tablet Take 400 mg by mouth daily.     Marland Kitchen amLODipine (NORVASC) 5 MG tablet Take 5 mg by mouth daily with breakfast.     . aspirin EC 81 MG tablet Take 81 mg by mouth daily.    . budesonide-formoterol (SYMBICORT) 160-4.5 MCG/ACT inhaler Inhale 2 puffs into the lungs 2 (two) times daily. 1 Inhaler 5  . clotrimazole (LOTRIMIN) 1 % cream Apply 1 application topically 2 (two) times daily.    . cyclobenzaprine (FLEXERIL) 10 MG tablet Take 1 tablet by mouth as needed.    . diazepam (VALIUM) 5 MG tablet Take 5 mg by mouth every 6 (six) hours as needed for anxiety.    Marland Kitchen escitalopram (LEXAPRO) 10 MG tablet Take 10 mg by mouth daily.    Marland Kitchen estradiol (ESTRACE) 1 MG tablet Take 1 mg by mouth daily with breakfast.     . gabapentin (NEURONTIN) 100 MG capsule TWO CAPSULES BY MOUTH EVERY 8 HOURS (Patient taking differently: Take 200 mg by mouth every 8 (eight) hours. ) 90 capsule 5  . methocarbamol (ROBAXIN) 500 MG tablet Take 1 tablet (500 mg total) by mouth 2 (two) times daily. 60 tablet 5  . nortriptyline (PAMELOR) 10 MG capsule Take 1 capsule by mouth as needed.    Marland Kitchen oxybutynin (DITROPAN-XL) 5 MG 24 hr tablet Take 5 mg by mouth at bedtime.    Marland Kitchen oxyCODONE-acetaminophen (PERCOCET/ROXICET) 5-325 MG tablet Take by mouth every 4 (four) hours as needed for severe pain.    . pantoprazole (PROTONIX) 20 MG tablet Take 20 mg by mouth daily.    . Vitamin D, Cholecalciferol, 1000 units CAPS Take 1,000 Units by mouth daily.     No facility-administered medications prior to visit.         Objective:   Physical Exam  Vitals:   10/03/16 1331  BP: 118/72  Pulse: 89  SpO2: 99%  Weight: 146 lb (66.2 kg)  Height: 5\' 1"  (1.549 m)    Gen: Pleasant, well-nourished, in no distress,   normal affect  ENT: No lesions,  mouth clear,  oropharynx clear, no postnasal drip  Neck: No JVD, no TMG, no carotid bruits  Lungs: No use of accessory muscles, slightly increased coarseness R base, otherwise clear B   Cardiovascular: RRR, heart sounds normal, no murmur or gallops, no peripheral edema  Musculoskeletal: No deformities, no cyanosis or clubbing  Neuro: alert, non focal  Skin: Warm, no lesions or rashes      Assessment & Plan:  Asthma Very mild - normal airflows but some curve to f/v loop. She has some exertional SOB that is out of proportional to  this, possibly due to deconditioning. We will try stopping the symbicort to see how much she misses it. She will also work on building up her stamina.   Baltazar Apo, MD, PhD 10/03/2016, 5:26 PM [facility] [phone] or if no answer [phone]

## 2016-10-03 NOTE — Assessment & Plan Note (Signed)
Very mild - normal airflows but some curve to f/v loop. She has some exertional SOB that is out of proportional to this, possibly due to deconditioning. We will try stopping the symbicort to see how much she misses it. She will also work on building up her stamina.

## 2016-10-03 NOTE — Patient Instructions (Addendum)
We will stop Symbicort for now.    Take albuterol 2 puffs up to every 4 hours if needed for shortness of breath.  Work on increasing your exercise and you conditioning.

## 2016-10-03 NOTE — Progress Notes (Signed)
PFT done today. 

## 2016-10-24 ENCOUNTER — Other Ambulatory Visit: Payer: Self-pay | Admitting: Neurological Surgery

## 2016-10-24 DIAGNOSIS — M5412 Radiculopathy, cervical region: Secondary | ICD-10-CM | POA: Diagnosis not present

## 2016-10-24 DIAGNOSIS — M47812 Spondylosis without myelopathy or radiculopathy, cervical region: Secondary | ICD-10-CM | POA: Diagnosis not present

## 2016-11-06 ENCOUNTER — Ambulatory Visit (HOSPITAL_COMMUNITY)
Admission: RE | Admit: 2016-11-06 | Discharge: 2016-11-06 | Disposition: A | Discharge status: Home / Self Care | Payer: Medicare Other | Source: Ambulatory Visit | Attending: Vascular Surgery | Admitting: Vascular Surgery

## 2016-11-06 ENCOUNTER — Encounter (HOSPITAL_COMMUNITY)
Admission: RE | Admit: 2016-11-06 | Discharge: 2016-11-06 | Disposition: A | Discharge status: Home / Self Care | Payer: Medicare Other | Source: Ambulatory Visit | Attending: Neurological Surgery | Admitting: Neurological Surgery

## 2016-11-06 ENCOUNTER — Encounter (HOSPITAL_COMMUNITY): Payer: Self-pay

## 2016-11-06 DIAGNOSIS — M5412 Radiculopathy, cervical region: Secondary | ICD-10-CM | POA: Insufficient documentation

## 2016-11-06 DIAGNOSIS — Z0389 Encounter for observation for other suspected diseases and conditions ruled out: Secondary | ICD-10-CM | POA: Diagnosis not present

## 2016-11-06 DIAGNOSIS — Z01812 Encounter for preprocedural laboratory examination: Secondary | ICD-10-CM | POA: Insufficient documentation

## 2016-11-06 DIAGNOSIS — Z01818 Encounter for other preprocedural examination: Secondary | ICD-10-CM

## 2016-11-06 HISTORY — DX: Other amnesia: R41.3

## 2016-11-06 HISTORY — DX: Unspecified asthma, uncomplicated: J45.909

## 2016-11-06 HISTORY — DX: Headache: R51

## 2016-11-06 HISTORY — DX: Pneumothorax, unspecified: J93.9

## 2016-11-06 HISTORY — DX: Headache, unspecified: R51.9

## 2016-11-06 LAB — BASIC METABOLIC PANEL
Anion gap: 8 (ref 5–15)
BUN: 5 mg/dL — AB (ref 6–20)
CHLORIDE: 102 mmol/L (ref 101–111)
CO2: 28 mmol/L (ref 22–32)
Calcium: 9.5 mg/dL (ref 8.9–10.3)
Creatinine, Ser: 0.69 mg/dL (ref 0.44–1.00)
GFR calc Af Amer: >60 mL/min (ref >60)
GLUCOSE: 94 mg/dL (ref 65–99)
POTASSIUM: 3.4 mmol/L — AB (ref 3.5–5.1)
Sodium: 138 mmol/L (ref 135–145)

## 2016-11-06 LAB — CBC
HEMATOCRIT: 40.9 % (ref 36.0–46.0)
Hemoglobin: 13.3 g/dL (ref 12.0–15.0)
MCH: 29.4 pg (ref 26.0–34.0)
MCHC: 32.5 g/dL (ref 30.0–36.0)
MCV: 90.5 fL (ref 78.0–100.0)
PLATELETS: 265 10*3/uL (ref 150–400)
RBC: 4.52 MIL/uL (ref 3.87–5.11)
RDW: 12.8 % (ref 11.5–15.5)
WBC: 9.1 10*3/uL (ref 4.0–10.5)

## 2016-11-06 LAB — SURGICAL PCR SCREEN
MRSA, PCR: NEGATIVE
Staphylococcus aureus: NEGATIVE

## 2016-11-06 LAB — TYPE AND SCREEN
ABO/RH(D): B POS
ANTIBODY SCREEN: NEGATIVE

## 2016-11-06 NOTE — Progress Notes (Signed)
SPOKE WITH [facility] ABOUT PATIENT STATING SHE HAS BEEN DIFFICULT INTUBATION, SURGERIES DONE AT [facility].

## 2016-11-06 NOTE — Pre-Procedure Instructions (Signed)
Kimberly [name]  11/06/2016      [place], [place] [address] [place] [address] Phone: [phone] Fax: [phone]    Your procedure is scheduled on   Tuesday  11/13/16  Report to [facility] Admitting at 745 A.M.  Call this number if you have problems the morning of surgery:  [phone]   Remember:  Do not eat food or drink liquids after midnight.  Take these medicines the morning of surgery with A SIP OF WATER   ALBUTEROL IF NEEDED, AMLODIPINE (NORVASC), DIAZEPAM IF NEEDED, ESCITALOPRAM (LEXAPRO), ESTRADIOL (ESTRACE), OXYBUTYNIN IF NEEDED, OXYCODONE IF NEEDED, PANTOPRAZOLE   (STOP 7 DAYS PRIOR TO SURGERY ASPIRIN OR ASPIRIN PRODUCTS, IBUPROFEN/ ADVIL/ MOTRIN, GOODY POWDERS, BC'S, HERBAL MEDICINES)  Do not wear jewelry, make-up or nail polish.  Do not wear lotions, powders, or perfumes, or deoderant.  Do not shave 48 hours prior to surgery.  Men may shave face and neck.  Do not bring valuables to the hospital.  [facility] is not responsible for any belongings or valuables.  Contacts, dentures or bridgework may not be worn into surgery.  Leave your suitcase in the car.  After surgery it may be brought to your room.  For patients admitted to the hospital, discharge time will be determined by your treatment team.  Patients discharged the day of surgery will not be allowed to drive home.   Name and phone number of your driver:    Special instructions:  [facility] - Preparing for Surgery  Before surgery, you can play an important role.  Because skin is not sterile, your skin needs to be as free of germs as possible.  You can reduce the number of germs on you skin by washing with CHG (chlorahexidine gluconate) soap before surgery.  CHG is an antiseptic cleaner which kills germs and bonds with the skin to continue killing germs even after washing.  Please DO NOT use if you have an allergy to CHG or  antibacterial soaps.  If your skin becomes reddened/irritated stop using the CHG and inform your nurse when you arrive at Short Stay.  Do not shave (including legs and underarms) for at least 48 hours prior to the first CHG shower.  You may shave your face.  Please follow these instructions carefully:   1.  Shower with CHG Soap the night before surgery and the                                morning of Surgery.  2.  If you choose to wash your hair, wash your hair first as usual with your       normal shampoo.  3.  After you shampoo, rinse your hair and body thoroughly to remove the                      Shampoo.  4.  Use CHG as you would any other liquid soap.  You can apply chg directly       to the skin and wash gently with scrungie or a clean washcloth.  5.  Apply the CHG Soap to your body ONLY FROM THE NECK DOWN.        Do not use on open wounds or open sores.  Avoid contact with your eyes,       ears, mouth and genitals (private parts).  Wash genitals (private parts)       with your normal soap.  6.  Wash thoroughly, paying special attention to the area where your surgery        will be performed.  7.  Thoroughly rinse your body with warm water from the neck down.  8.  DO NOT shower/wash with your normal soap after using and rinsing off       the CHG Soap.  9.  Pat yourself dry with a clean towel.            10.  Wear clean pajamas.            11.  Place clean sheets on your bed the night of your first shower and do not        sleep with pets.  Day of Surgery  Do not apply any lotions/deoderants the morning of surgery.  Please wear clean clothes to the hospital/surgery center.    Please read over the following fact sheets that you were given. MRSA Information and Surgical Site Infection Prevention

## 2016-11-08 ENCOUNTER — Encounter (HOSPITAL_COMMUNITY): Payer: Self-pay

## 2016-11-08 NOTE — Progress Notes (Signed)
Anesthesia Chart Review: Patient is a 56 year old female scheduled for C2-6 posterior cervical fusion with lateral mass fixation on 11/13/2016 by Dr. Ellene Route.  History includes never smoker, DIFFICULT INTUBATION, HTN, asthma, COPD, post-op N/V, GERD, anxiety, depression, brain cyst (not documented 09/12/16 brain MRI), memory difficulties, hysterectomy, cholecystectomy 06/17/15, left shoulder arthroscopy 05/18/15, C3-4 and C6-T1 ACDF 08/06/14, C4-6 ACDF 93/57/01 complicated by post-operative right pneumothorax requiring chest tube (seen by pulmonology and cause of pneumothorax unclear--imaging did not show any blebs; A-1 antitrypsin was 175, phenotype PI*MM: "90% of normal individuals have the MM phenotypes, with normal quantitative AAT levels...").   Anesthesia history includes DIFFICULT AIRWAY:  - 06/17/15 (cholecystectomy: Anesthesia records document grade II view, glidescope used, successful intubation after 3 attempts. Comments: "Started with glidescope, first look unable to pass. Needed extreme J shape to ETT. Very anterior larynx. Next pass with glide and ett, fogged lens. Third pass successful, intubated trachea, etco2, fog in tube. Sats 98-100% for duration of intubation attempts. Easy mask vent." - 07/04/16 (ACDF): Grade II view. IV induction. Rigid stylet, Video-laryngoscopy and Bougie stylet (Larynx very anterior, bougie stylet advanced through cords and 7.0 ETT advanced, cuff torn, bougie reinserted and 6.5 ETT inserted using Mac 3 blade. One attempt. Difficulty anticipated due to reduced neck mobility, anterior larynx, limited oral opening. Recommend induction with short-acting agent, and alternative techniques readily available.   Medications include albuterol, amlodipine, ASA 81 mg (on hold), Symbicort, Flexeril, Valium, Lexapro, estradiol, nortriptyline, Ditropan XL, oxycodone, Protonix.  BP 117/88   Pulse 100   Temp 36.7 C   Resp 20   Ht _0  (1.549 m)   Wt 146 lb 1.6 oz (66.3 kg)    SpO2 100%   BMI 27.61 kg/m   - PCP is listed as Dr. Glendon Axe. - Pulmonologist is Dr. Baltazar Apo, last visit 10/03/16. He had recommended a follow-up CXR, so this was done at PAT (see below).  EKG 07/05/16: SR with premature supraventricular complexes. PACs are new but otherwise no significant change since last tracing.    Exercise stress test 06/26/16 Jefm Bryant; scanned under Media tab, Correspondence 07/04/16): Overall Impression: Normal stress test.  Carotid duplex 07/07/14: Impression: Mminimal heterogeneous plaque, with no hemodynamically significant stenosis.  CXR 11/06/16: FINDINGS: Lungs are clear. Heart size and pulmonary vascularity are normal. No adenopathy. There is postoperative change in the lower cervical spine region. IMPRESSION: No edema or consolidation.  PFTs 10/03/16: FVC 2.06 (84% pre, 81% post), FEV1 1.69 (86% pre, 89% post), DLCOunc 14.67 (72%).  Preoperative labs noted.   If no acute changes then I anticipate that she can proceed as planned.  George Hugh [facility] Short Stay Center/Anesthesiology Phone [phone] 11/08/2016 1:36 PM

## 2016-11-08 NOTE — Anesthesia Preprocedure Evaluation (Addendum)
Anesthesia Evaluation  Patient identified by MRN, date of birth, ID band Patient awake    History of Anesthesia Complications (+) PONV and DIFFICULT AIRWAY  Airway Mallampati: II  TM Distance: >3 FB Neck ROM: Limited    Dental   Pulmonary shortness of breath, asthma ,    breath sounds clear to auscultation       Cardiovascular hypertension,  Rhythm:Regular Rate:Normal     Neuro/Psych    GI/Hepatic Neg liver ROS, GERD  ,  Endo/Other  negative endocrine ROS  Renal/GU negative Renal ROS     Musculoskeletal   Abdominal   Peds  Hematology   Anesthesia Other Findings   Reproductive/Obstetrics                            Anesthesia Physical Anesthesia Plan  ASA: III  Anesthesia Plan: General   Post-op Pain Management:    Induction: Intravenous  Airway Management Planned: Oral ETT  Additional Equipment:   Intra-op Plan:   Post-operative Plan: Possible Post-op intubation/ventilation  Informed Consent: I have reviewed the patients History and Physical, chart, labs and discussed the procedure including the risks, benefits and alternatives for the proposed anesthesia with the patient or authorized representative who has indicated his/her understanding and acceptance.   Dental advisory given  Plan Discussed with: CRNA and Anesthesiologist  Anesthesia Plan Comments: (See my anesthesia note. History of difficult intubation, history of right PTX s/p ACDF. Myra Gianotti, PA-C)       Anesthesia Quick Evaluation

## 2016-11-13 ENCOUNTER — Inpatient Hospital Stay (HOSPITAL_COMMUNITY): Payer: Medicare Other | Admitting: Vascular Surgery

## 2016-11-13 ENCOUNTER — Inpatient Hospital Stay (HOSPITAL_COMMUNITY): Payer: Medicare Other | Admitting: Anesthesiology

## 2016-11-13 ENCOUNTER — Encounter (HOSPITAL_COMMUNITY): Payer: Self-pay | Admitting: Urology

## 2016-11-13 ENCOUNTER — Inpatient Hospital Stay (HOSPITAL_COMMUNITY)
Admission: RE | Admit: 2016-11-13 | Discharge: 2016-11-15 | DRG: 473 | Disposition: A | Discharge status: Home / Self Care | Payer: Medicare Other | Source: Ambulatory Visit | Attending: Neurological Surgery | Admitting: Neurological Surgery

## 2016-11-13 ENCOUNTER — Inpatient Hospital Stay (HOSPITAL_COMMUNITY): Payer: Medicare Other

## 2016-11-13 ENCOUNTER — Encounter (HOSPITAL_COMMUNITY)
Admission: RE | Disposition: A | Discharge status: Home / Self Care | Payer: Self-pay | Source: Ambulatory Visit | Attending: Neurological Surgery

## 2016-11-13 DIAGNOSIS — M960 Pseudarthrosis after fusion or arthrodesis: Secondary | ICD-10-CM | POA: Diagnosis present

## 2016-11-13 DIAGNOSIS — M5412 Radiculopathy, cervical region: Secondary | ICD-10-CM | POA: Diagnosis not present

## 2016-11-13 DIAGNOSIS — Z90710 Acquired absence of both cervix and uterus: Secondary | ICD-10-CM

## 2016-11-13 DIAGNOSIS — Z91010 Allergy to peanuts: Secondary | ICD-10-CM | POA: Diagnosis not present

## 2016-11-13 DIAGNOSIS — M542 Cervicalgia: Secondary | ICD-10-CM | POA: Diagnosis present

## 2016-11-13 DIAGNOSIS — Z8701 Personal history of pneumonia (recurrent): Secondary | ICD-10-CM

## 2016-11-13 DIAGNOSIS — Z7982 Long term (current) use of aspirin: Secondary | ICD-10-CM

## 2016-11-13 DIAGNOSIS — Z8249 Family history of ischemic heart disease and other diseases of the circulatory system: Secondary | ICD-10-CM

## 2016-11-13 DIAGNOSIS — M479 Spondylosis, unspecified: Secondary | ICD-10-CM | POA: Diagnosis present

## 2016-11-13 DIAGNOSIS — I10 Essential (primary) hypertension: Secondary | ICD-10-CM | POA: Diagnosis present

## 2016-11-13 DIAGNOSIS — J45909 Unspecified asthma, uncomplicated: Secondary | ICD-10-CM | POA: Diagnosis present

## 2016-11-13 DIAGNOSIS — M4322 Fusion of spine, cervical region: Secondary | ICD-10-CM | POA: Diagnosis not present

## 2016-11-13 DIAGNOSIS — F419 Anxiety disorder, unspecified: Secondary | ICD-10-CM | POA: Diagnosis present

## 2016-11-13 DIAGNOSIS — Q761 Klippel-Feil syndrome: Secondary | ICD-10-CM | POA: Diagnosis not present

## 2016-11-13 DIAGNOSIS — G8929 Other chronic pain: Secondary | ICD-10-CM | POA: Diagnosis present

## 2016-11-13 DIAGNOSIS — Z79899 Other long term (current) drug therapy: Secondary | ICD-10-CM

## 2016-11-13 DIAGNOSIS — Z87442 Personal history of urinary calculi: Secondary | ICD-10-CM | POA: Diagnosis not present

## 2016-11-13 DIAGNOSIS — Y838 Other surgical procedures as the cause of abnormal reaction of the patient, or of later complication, without mention of misadventure at the time of the procedure: Secondary | ICD-10-CM | POA: Diagnosis present

## 2016-11-13 DIAGNOSIS — Z09 Encounter for follow-up examination after completed treatment for conditions other than malignant neoplasm: Secondary | ICD-10-CM

## 2016-11-13 DIAGNOSIS — Z91013 Allergy to seafood: Secondary | ICD-10-CM

## 2016-11-13 DIAGNOSIS — Z981 Arthrodesis status: Secondary | ICD-10-CM | POA: Diagnosis not present

## 2016-11-13 DIAGNOSIS — F329 Major depressive disorder, single episode, unspecified: Secondary | ICD-10-CM | POA: Diagnosis present

## 2016-11-13 DIAGNOSIS — Z885 Allergy status to narcotic agent status: Secondary | ICD-10-CM | POA: Diagnosis not present

## 2016-11-13 DIAGNOSIS — K219 Gastro-esophageal reflux disease without esophagitis: Secondary | ICD-10-CM | POA: Diagnosis present

## 2016-11-13 DIAGNOSIS — S129XXA Fracture of neck, unspecified, initial encounter: Secondary | ICD-10-CM | POA: Diagnosis present

## 2016-11-13 DIAGNOSIS — Z419 Encounter for procedure for purposes other than remedying health state, unspecified: Secondary | ICD-10-CM

## 2016-11-13 DIAGNOSIS — M963 Postlaminectomy kyphosis: Secondary | ICD-10-CM | POA: Diagnosis not present

## 2016-11-13 HISTORY — PX: POSTERIOR CERVICAL FUSION/FORAMINOTOMY: SHX5038

## 2016-11-13 SURGERY — POSTERIOR CERVICAL FUSION/FORAMINOTOMY LEVEL 4
Anesthesia: General | Site: Spine Cervical

## 2016-11-13 MED ORDER — METHOCARBAMOL 1000 MG/10ML IJ SOLN
500.0000 mg | Freq: Four times a day (QID) | INTRAVENOUS | Status: DC | PRN
  Filled 2016-11-13: qty 5

## 2016-11-13 MED ORDER — SODIUM CHLORIDE 0.9% FLUSH: 3.0000 mL | INTRAVENOUS | Status: DC | PRN

## 2016-11-13 MED ORDER — ACETAMINOPHEN 500 MG PO TABS
500.0000 mg | ORAL_TABLET | Freq: Two times a day (BID) | ORAL | Status: DC | PRN
Start: 2016-11-13 — End: 2016-11-13

## 2016-11-13 MED ORDER — DOCUSATE SODIUM 100 MG PO CAPS
100.0000 mg | ORAL_CAPSULE | Freq: Two times a day (BID) | ORAL | Status: DC
  Administered 2016-11-13 – 2016-11-15 (×4): 100 mg via ORAL
  Filled 2016-11-13 (×4): qty 1

## 2016-11-13 MED ORDER — CHLORHEXIDINE GLUCONATE CLOTH 2 % EX PADS: 6.0000 | MEDICATED_PAD | Freq: Once | CUTANEOUS | Status: DC

## 2016-11-13 MED ORDER — MENTHOL 3 MG MT LOZG: 1.0000 | LOZENGE | OROMUCOSAL | Status: DC | PRN

## 2016-11-13 MED ORDER — OXYBUTYNIN CHLORIDE ER 5 MG PO TB24
5.0000 mg | ORAL_TABLET | Freq: Every day | ORAL | Status: DC | PRN
  Filled 2016-11-13: qty 1

## 2016-11-13 MED ORDER — 0.9 % SODIUM CHLORIDE (POUR BTL) OPTIME
TOPICAL | Status: DC | PRN
  Administered 2016-11-13: 1000 mL

## 2016-11-13 MED ORDER — ONDANSETRON HCL 4 MG/2ML IJ SOLN
4.0000 mg | Freq: Four times a day (QID) | INTRAMUSCULAR | Status: DC | PRN
  Administered 2016-11-13 (×2): 4 mg via INTRAVENOUS
  Filled 2016-11-13 (×2): qty 2

## 2016-11-13 MED ORDER — EPHEDRINE 5 MG/ML INJ
INTRAVENOUS | Status: AC
  Filled 2016-11-13: qty 10

## 2016-11-13 MED ORDER — ALUM & MAG HYDROXIDE-SIMETH 200-200-20 MG/5ML PO SUSP: 30.0000 mL | Freq: Four times a day (QID) | ORAL | Status: DC | PRN

## 2016-11-13 MED ORDER — SODIUM CHLORIDE 0.9 % IR SOLN
Status: DC | PRN
  Administered 2016-11-13: 500 mL

## 2016-11-13 MED ORDER — FLEET ENEMA 7-19 GM/118ML RE ENEM: 1.0000 | ENEMA | Freq: Once | RECTAL | Status: DC | PRN

## 2016-11-13 MED ORDER — OXYCODONE HCL 5 MG PO TABS
10.0000 mg | ORAL_TABLET | Freq: Two times a day (BID) | ORAL | Status: DC
  Administered 2016-11-13 – 2016-11-15 (×4): 10 mg via ORAL
  Filled 2016-11-13 (×4): qty 2

## 2016-11-13 MED ORDER — THROMBIN 5000 UNITS EX SOLR
CUTANEOUS | Status: AC
  Filled 2016-11-13: qty 5000

## 2016-11-13 MED ORDER — PHENYLEPHRINE HCL 10 MG/ML IJ SOLN
INTRAVENOUS | Status: DC | PRN
  Administered 2016-11-13: 20 ug/min via INTRAVENOUS

## 2016-11-13 MED ORDER — ONDANSETRON HCL 4 MG/2ML IJ SOLN
INTRAMUSCULAR | Status: AC
  Filled 2016-11-13: qty 4

## 2016-11-13 MED ORDER — SUGAMMADEX SODIUM 200 MG/2ML IV SOLN
INTRAVENOUS | Status: AC
  Filled 2016-11-13: qty 4

## 2016-11-13 MED ORDER — ONDANSETRON HCL 4 MG/2ML IJ SOLN
4.0000 mg | INTRAMUSCULAR | Status: DC | PRN
  Administered 2016-11-14: 4 mg via INTRAVENOUS
  Filled 2016-11-13: qty 2

## 2016-11-13 MED ORDER — SUGAMMADEX SODIUM 200 MG/2ML IV SOLN
INTRAVENOUS | Status: DC | PRN
  Administered 2016-11-13: 150 mg via INTRAVENOUS

## 2016-11-13 MED ORDER — POLYETHYLENE GLYCOL 3350 17 G PO PACK: 17.0000 g | PACK | Freq: Every day | ORAL | Status: DC | PRN

## 2016-11-13 MED ORDER — HEMOSTATIC AGENTS (NO CHARGE) OPTIME
TOPICAL | Status: DC | PRN
  Administered 2016-11-13: 1 via TOPICAL

## 2016-11-13 MED ORDER — THROMBIN 5000 UNITS EX SOLR
CUTANEOUS | Status: DC | PRN
  Administered 2016-11-13 (×2): 5 mL via TOPICAL

## 2016-11-13 MED ORDER — THROMBIN 5000 UNITS EX SOLR
CUTANEOUS | Status: AC
  Filled 2016-11-13: qty 10000

## 2016-11-13 MED ORDER — LIDOCAINE 2% (20 MG/ML) 5 ML SYRINGE
INTRAMUSCULAR | Status: AC
  Filled 2016-11-13: qty 15

## 2016-11-13 MED ORDER — DIAZEPAM 5 MG PO TABS
10.0000 mg | ORAL_TABLET | Freq: Every day | ORAL | Status: DC | PRN
  Administered 2016-11-13 – 2016-11-14 (×2): 10 mg via ORAL
  Filled 2016-11-13: qty 2

## 2016-11-13 MED ORDER — ALBUMIN HUMAN 5 % IV SOLN
INTRAVENOUS | Status: DC | PRN
  Administered 2016-11-13 (×2): via INTRAVENOUS

## 2016-11-13 MED ORDER — BISACODYL 10 MG RE SUPP: 10.0000 mg | Freq: Every day | RECTAL | Status: DC | PRN

## 2016-11-13 MED ORDER — ACETAMINOPHEN 650 MG RE SUPP: 650.0000 mg | RECTAL | Status: DC | PRN

## 2016-11-13 MED ORDER — LIDOCAINE-EPINEPHRINE 1 %-1:100000 IJ SOLN
INTRAMUSCULAR | Status: AC
  Filled 2016-11-13: qty 1

## 2016-11-13 MED ORDER — ACYCLOVIR 400 MG PO TABS
400.0000 mg | ORAL_TABLET | Freq: Two times a day (BID) | ORAL | Status: DC
  Administered 2016-11-13 – 2016-11-15 (×5): 400 mg via ORAL
  Filled 2016-11-13 (×5): qty 1

## 2016-11-13 MED ORDER — PHENYLEPHRINE 40 MCG/ML (10ML) SYRINGE FOR IV PUSH (FOR BLOOD PRESSURE SUPPORT)
PREFILLED_SYRINGE | INTRAVENOUS | Status: AC
  Filled 2016-11-13: qty 10

## 2016-11-13 MED ORDER — METHOCARBAMOL 500 MG PO TABS
500.0000 mg | ORAL_TABLET | Freq: Four times a day (QID) | ORAL | Status: DC | PRN
  Administered 2016-11-13: 500 mg via ORAL
  Filled 2016-11-13: qty 1

## 2016-11-13 MED ORDER — OXYCODONE HCL 5 MG PO TABS
5.0000 mg | ORAL_TABLET | ORAL | Status: DC | PRN
  Administered 2016-11-13 – 2016-11-15 (×5): 10 mg via ORAL
  Filled 2016-11-13 (×5): qty 2

## 2016-11-13 MED ORDER — BACITRACIN ZINC 500 UNIT/GM EX OINT
TOPICAL_OINTMENT | CUTANEOUS | Status: AC
  Filled 2016-11-13: qty 28.35

## 2016-11-13 MED ORDER — PROPOFOL 10 MG/ML IV BOLUS
INTRAVENOUS | Status: AC
  Filled 2016-11-13: qty 20

## 2016-11-13 MED ORDER — ACETAMINOPHEN 325 MG PO TABS: 650.0000 mg | ORAL_TABLET | ORAL | Status: DC | PRN

## 2016-11-13 MED ORDER — ESCITALOPRAM OXALATE 10 MG PO TABS
10.0000 mg | ORAL_TABLET | Freq: Every day | ORAL | Status: DC
  Administered 2016-11-13 – 2016-11-15 (×3): 10 mg via ORAL
  Filled 2016-11-13 (×3): qty 1

## 2016-11-13 MED ORDER — ONDANSETRON HCL 4 MG PO TABS
4.0000 mg | ORAL_TABLET | ORAL | Status: DC | PRN
  Administered 2016-11-14 (×3): 4 mg via ORAL
  Filled 2016-11-13 (×4): qty 1

## 2016-11-13 MED ORDER — BUPIVACAINE HCL (PF) 0.5 % IJ SOLN
INTRAMUSCULAR | Status: DC | PRN
  Administered 2016-11-13: 5 mL

## 2016-11-13 MED ORDER — SODIUM CHLORIDE 0.9 % IV SOLN: INTRAVENOUS | Status: DC

## 2016-11-13 MED ORDER — ALBUTEROL SULFATE (2.5 MG/3ML) 0.083% IN NEBU: 2.5000 mg | INHALATION_SOLUTION | RESPIRATORY_TRACT | Status: DC | PRN

## 2016-11-13 MED ORDER — FENTANYL CITRATE (PF) 250 MCG/5ML IJ SOLN
INTRAMUSCULAR | Status: AC
  Filled 2016-11-13: qty 5

## 2016-11-13 MED ORDER — MIDAZOLAM HCL 5 MG/5ML IJ SOLN
INTRAMUSCULAR | Status: DC | PRN
  Administered 2016-11-13: 2 mg via INTRAVENOUS

## 2016-11-13 MED ORDER — FENTANYL CITRATE (PF) 100 MCG/2ML IJ SOLN
INTRAMUSCULAR | Status: AC
  Filled 2016-11-13: qty 2

## 2016-11-13 MED ORDER — SENNA 8.6 MG PO TABS
1.0000 | ORAL_TABLET | Freq: Two times a day (BID) | ORAL | Status: DC
  Administered 2016-11-13 – 2016-11-15 (×4): 8.6 mg via ORAL
  Filled 2016-11-13 (×4): qty 1

## 2016-11-13 MED ORDER — ONDANSETRON HCL 4 MG PO TABS: 4.0000 mg | ORAL_TABLET | Freq: Four times a day (QID) | ORAL | Status: DC | PRN

## 2016-11-13 MED ORDER — TETRAHYDROZOLINE HCL 0.05 % OP SOLN: 1.0000 [drp] | Freq: Every day | OPHTHALMIC | Status: DC | PRN

## 2016-11-13 MED ORDER — HYDROMORPHONE HCL 1 MG/ML IJ SOLN
1.0000 mg | INTRAMUSCULAR | Status: DC | PRN
  Administered 2016-11-13 (×2): 1 mg via INTRAVENOUS
  Filled 2016-11-13 (×2): qty 1

## 2016-11-13 MED ORDER — ESTRADIOL 1 MG PO TABS
1.0000 mg | ORAL_TABLET | Freq: Every day | ORAL | Status: DC
  Administered 2016-11-13 – 2016-11-15 (×3): 1 mg via ORAL
  Filled 2016-11-13 (×3): qty 1

## 2016-11-13 MED ORDER — FENTANYL CITRATE (PF) 100 MCG/2ML IJ SOLN
INTRAMUSCULAR | Status: DC | PRN
  Administered 2016-11-13: 25 ug via INTRAVENOUS
  Administered 2016-11-13: 50 ug via INTRAVENOUS
  Administered 2016-11-13: 25 ug via INTRAVENOUS
  Administered 2016-11-13: 100 ug via INTRAVENOUS
  Administered 2016-11-13: 50 ug via INTRAVENOUS
  Administered 2016-11-13: 25 ug via INTRAVENOUS
  Administered 2016-11-13: 50 ug via INTRAVENOUS
  Administered 2016-11-13 (×5): 25 ug via INTRAVENOUS

## 2016-11-13 MED ORDER — FENTANYL CITRATE (PF) 100 MCG/2ML IJ SOLN
25.0000 ug | INTRAMUSCULAR | Status: DC | PRN
  Administered 2016-11-13 (×2): 25 ug via INTRAVENOUS
  Administered 2016-11-13: 50 ug via INTRAVENOUS

## 2016-11-13 MED ORDER — SUCCINYLCHOLINE CHLORIDE 200 MG/10ML IV SOSY
PREFILLED_SYRINGE | INTRAVENOUS | Status: AC
  Filled 2016-11-13: qty 20

## 2016-11-13 MED ORDER — PROPOFOL 10 MG/ML IV BOLUS
INTRAVENOUS | Status: DC | PRN
  Administered 2016-11-13: 30 mg via INTRAVENOUS
  Administered 2016-11-13: 20 mg via INTRAVENOUS
  Administered 2016-11-13: 170 mg via INTRAVENOUS

## 2016-11-13 MED ORDER — DIAZEPAM 5 MG PO TABS
ORAL_TABLET | ORAL | Status: AC
  Filled 2016-11-13: qty 2

## 2016-11-13 MED ORDER — SODIUM CHLORIDE 0.9% FLUSH: 3.0000 mL | Freq: Two times a day (BID) | INTRAVENOUS | Status: DC

## 2016-11-13 MED ORDER — ONDANSETRON HCL 4 MG/2ML IJ SOLN
INTRAMUSCULAR | Status: DC | PRN
  Administered 2016-11-13 (×2): 4 mg via INTRAVENOUS

## 2016-11-13 MED ORDER — BACITRACIN ZINC 500 UNIT/GM EX OINT
TOPICAL_OINTMENT | CUTANEOUS | Status: DC | PRN
  Administered 2016-11-13: 1 via TOPICAL

## 2016-11-13 MED ORDER — AMLODIPINE BESYLATE 5 MG PO TABS
5.0000 mg | ORAL_TABLET | Freq: Every day | ORAL | Status: DC
  Administered 2016-11-13 – 2016-11-15 (×3): 5 mg via ORAL
  Filled 2016-11-13 (×3): qty 1

## 2016-11-13 MED ORDER — LIDOCAINE-EPINEPHRINE 1 %-1:100000 IJ SOLN
INTRAMUSCULAR | Status: DC | PRN
  Administered 2016-11-13: 5 mL

## 2016-11-13 MED ORDER — LACTATED RINGERS IV SOLN
INTRAVENOUS | Status: DC
  Administered 2016-11-13 (×2): via INTRAVENOUS

## 2016-11-13 MED ORDER — THROMBIN 5000 UNITS EX SOLR
CUTANEOUS | Status: DC | PRN
  Administered 2016-11-13 (×2): 5000 [IU] via TOPICAL

## 2016-11-13 MED ORDER — LIDOCAINE HCL (CARDIAC) 20 MG/ML IV SOLN
INTRAVENOUS | Status: DC | PRN
  Administered 2016-11-13: 100 mg via INTRAVENOUS

## 2016-11-13 MED ORDER — CYCLOBENZAPRINE HCL 10 MG PO TABS
10.0000 mg | ORAL_TABLET | ORAL | Status: DC | PRN
  Administered 2016-11-14 – 2016-11-15 (×3): 10 mg via ORAL
  Filled 2016-11-13 (×3): qty 1

## 2016-11-13 MED ORDER — ROCURONIUM BROMIDE 100 MG/10ML IV SOLN
INTRAVENOUS | Status: DC | PRN
  Administered 2016-11-13: 10 mg via INTRAVENOUS
  Administered 2016-11-13: 50 mg via INTRAVENOUS

## 2016-11-13 MED ORDER — NORTRIPTYLINE HCL 10 MG PO CAPS
10.0000 mg | ORAL_CAPSULE | Freq: Every evening | ORAL | Status: DC | PRN
  Filled 2016-11-13: qty 1

## 2016-11-13 MED ORDER — CEFAZOLIN SODIUM-DEXTROSE 2-4 GM/100ML-% IV SOLN
2.0000 g | Freq: Three times a day (TID) | INTRAVENOUS | Status: AC
  Administered 2016-11-13 – 2016-11-14 (×2): 2 g via INTRAVENOUS
  Filled 2016-11-13 (×2): qty 100

## 2016-11-13 MED ORDER — PHENYLEPHRINE HCL 10 MG/ML IJ SOLN
INTRAMUSCULAR | Status: DC | PRN
  Administered 2016-11-13 (×5): 80 ug via INTRAVENOUS

## 2016-11-13 MED ORDER — ROCURONIUM BROMIDE 50 MG/5ML IV SOSY
PREFILLED_SYRINGE | INTRAVENOUS | Status: AC
  Filled 2016-11-13: qty 10

## 2016-11-13 MED ORDER — ESMOLOL HCL 100 MG/10ML IV SOLN
INTRAVENOUS | Status: DC | PRN
  Administered 2016-11-13: 20 mg via INTRAVENOUS

## 2016-11-13 MED ORDER — EPHEDRINE SULFATE 50 MG/ML IJ SOLN
INTRAMUSCULAR | Status: DC | PRN
  Administered 2016-11-13: 5 mg via INTRAVENOUS

## 2016-11-13 MED ORDER — PANTOPRAZOLE SODIUM 40 MG PO TBEC
40.0000 mg | DELAYED_RELEASE_TABLET | Freq: Every day | ORAL | Status: DC
  Administered 2016-11-13 – 2016-11-15 (×3): 40 mg via ORAL
  Filled 2016-11-13 (×3): qty 1

## 2016-11-13 MED ORDER — MIDAZOLAM HCL 2 MG/2ML IJ SOLN
INTRAMUSCULAR | Status: AC
  Filled 2016-11-13: qty 2

## 2016-11-13 MED ORDER — CEFAZOLIN SODIUM-DEXTROSE 2-4 GM/100ML-% IV SOLN
2.0000 g | INTRAVENOUS | Status: AC
  Administered 2016-11-13: 2 g via INTRAVENOUS
  Filled 2016-11-13: qty 100

## 2016-11-13 MED ORDER — DEXAMETHASONE SODIUM PHOSPHATE 10 MG/ML IJ SOLN
INTRAMUSCULAR | Status: DC | PRN
  Administered 2016-11-13: 10 mg via INTRAVENOUS

## 2016-11-13 MED ORDER — PHENOL 1.4 % MT LIQD: 1.0000 | OROMUCOSAL | Status: DC | PRN

## 2016-11-13 SURGICAL SUPPLY — 80 items
ADH SKN CLS APL DERMABOND .7 (GAUZE/BANDAGES/DRESSINGS) ×2
APL SKNCLS STERI-STRIP NONHPOA (GAUZE/BANDAGES/DRESSINGS) ×1
BAG DECANTER FOR FLEXI CONT (MISCELLANEOUS) ×3 IMPLANT
BENZOIN TINCTURE PRP APPL 2/3 (GAUZE/BANDAGES/DRESSINGS) ×2 IMPLANT
BIT DRILL NEURO 2X3.1 SFT TUCH (MISCELLANEOUS) ×1 IMPLANT
BIT DRILL VUEPOINT II (BIT) IMPLANT
BLADE CLIPPER SURG (BLADE) ×2 IMPLANT
BLADE SURG 11 STRL SS (BLADE) ×3 IMPLANT
BLADE ULTRA TIP 2M (BLADE) IMPLANT
CANISTER SUCT 3000ML PPV (MISCELLANEOUS) ×3 IMPLANT
CARTRIDGE OIL MAESTRO DRILL (MISCELLANEOUS) ×1 IMPLANT
CLOSURE WOUND 1/2 X4 (GAUZE/BANDAGES/DRESSINGS)
DECANTER SPIKE VIAL GLASS SM (MISCELLANEOUS) ×3 IMPLANT
DERMABOND ADVANCED (GAUZE/BANDAGES/DRESSINGS) ×4
DERMABOND ADVANCED .7 DNX12 (GAUZE/BANDAGES/DRESSINGS) IMPLANT
DEVICE DISSECT PLASMABLAD 3.0S (MISCELLANEOUS) IMPLANT
DIFFUSER DRILL AIR PNEUMATIC (MISCELLANEOUS) ×1 IMPLANT
DRAPE C-ARM 42X72 X-RAY (DRAPES) ×6 IMPLANT
DRAPE LAPAROTOMY 100X72 PEDS (DRAPES) ×3 IMPLANT
DRAPE MICROSCOPE LEICA (MISCELLANEOUS) IMPLANT
DRAPE POUCH INSTRU U-SHP 10X18 (DRAPES) ×3 IMPLANT
DRILL BIT VUEPOINT II (BIT) ×3
DRILL NEURO 2X3.1 SOFT TOUCH (MISCELLANEOUS) ×3
DURAPREP 26ML APPLICATOR (WOUND CARE) ×3 IMPLANT
ELECT REM PT RETURN 9FT ADLT (ELECTROSURGICAL) ×3
ELECTRODE REM PT RTRN 9FT ADLT (ELECTROSURGICAL) ×1 IMPLANT
GAUZE SPONGE 4X4 12PLY STRL (GAUZE/BANDAGES/DRESSINGS) IMPLANT
GAUZE SPONGE 4X4 16PLY XRAY LF (GAUZE/BANDAGES/DRESSINGS) IMPLANT
GLOVE BIO SURGEON STRL SZ 6.5 (GLOVE) ×1 IMPLANT
GLOVE BIO SURGEON STRL SZ8 (GLOVE) ×2 IMPLANT
GLOVE BIO SURGEONS STRL SZ 6.5 (GLOVE) ×1
GLOVE BIOGEL PI IND STRL 6.5 (GLOVE) IMPLANT
GLOVE BIOGEL PI IND STRL 7.0 (GLOVE) IMPLANT
GLOVE BIOGEL PI IND STRL 8.5 (GLOVE) ×1 IMPLANT
GLOVE BIOGEL PI INDICATOR 6.5 (GLOVE) ×6
GLOVE BIOGEL PI INDICATOR 7.0 (GLOVE) ×2
GLOVE BIOGEL PI INDICATOR 8.5 (GLOVE) ×4
GLOVE ECLIPSE 8.5 STRL (GLOVE) ×3 IMPLANT
GLOVE EXAM NITRILE LRG STRL (GLOVE) IMPLANT
GLOVE EXAM NITRILE XL STR (GLOVE) IMPLANT
GLOVE EXAM NITRILE XS STR PU (GLOVE) IMPLANT
GOWN STRL REUS W/ TWL LRG LVL3 (GOWN DISPOSABLE) IMPLANT
GOWN STRL REUS W/ TWL XL LVL3 (GOWN DISPOSABLE) ×1 IMPLANT
GOWN STRL REUS W/TWL 2XL LVL3 (GOWN DISPOSABLE) ×3 IMPLANT
GOWN STRL REUS W/TWL LRG LVL3 (GOWN DISPOSABLE) ×3
GOWN STRL REUS W/TWL XL LVL3 (GOWN DISPOSABLE) ×3
HEMOSTAT POWDER SURGIFOAM 1G (HEMOSTASIS) ×4 IMPLANT
HEMOSTAT SURGICEL 2X14 (HEMOSTASIS) ×1 IMPLANT
KIT BASIN OR (CUSTOM PROCEDURE TRAY) ×3 IMPLANT
KIT INFUSE SMALL (Orthopedic Implant) ×2 IMPLANT
KIT ROOM TURNOVER OR (KITS) ×3 IMPLANT
NDL SPNL 18GX3.5 QUINCKE PK (NEEDLE) IMPLANT
NEEDLE HYPO 22GX1.5 SAFETY (NEEDLE) ×3 IMPLANT
NEEDLE SPNL 18GX3.5 QUINCKE PK (NEEDLE) IMPLANT
NS IRRIG 1000ML POUR BTL (IV SOLUTION) ×3 IMPLANT
OIL CARTRIDGE MAESTRO DRILL (MISCELLANEOUS)
PACK LAMINECTOMY NEURO (CUSTOM PROCEDURE TRAY) ×3 IMPLANT
PAD ARMBOARD 7.5X6 YLW CONV (MISCELLANEOUS) ×11 IMPLANT
PATTIES SURGICAL .25X.25 (GAUZE/BANDAGES/DRESSINGS) IMPLANT
PIN MAYFIELD SKULL DISP (PIN) ×3 IMPLANT
PLASMABLADE 3.0S (MISCELLANEOUS) ×3
ROD VUEPOINT 80MM (Rod) ×6 IMPLANT
RUBBERBAND STERILE (MISCELLANEOUS) IMPLANT
SCREW MA MM 3.5X12 (Screw) ×16 IMPLANT
SCREW MA MM 3.5X14 (Screw) ×4 IMPLANT
SCREW SET THREADED (Screw) ×24 IMPLANT
SCREW SPINAL VUEPOINT 3.5X34 (Screw) ×4 IMPLANT
SPONGE LAP 4X18 X RAY DECT (DISPOSABLE) IMPLANT
SPONGE SURGIFOAM ABS GEL SZ50 (HEMOSTASIS) ×3 IMPLANT
STAPLER SKIN PROX WIDE 3.9 (STAPLE) ×3 IMPLANT
STRIP CLOSURE SKIN 1/2X4 (GAUZE/BANDAGES/DRESSINGS) IMPLANT
SUT ETHILON 3 0 FSL (SUTURE) IMPLANT
SUT VIC AB 0 CT1 18XCR BRD8 (SUTURE) ×1 IMPLANT
SUT VIC AB 0 CT1 8-18 (SUTURE) ×3
SUT VIC AB 2-0 CP2 18 (SUTURE) ×3 IMPLANT
SUT VIC AB 3-0 SH 8-18 (SUTURE) ×6 IMPLANT
TOWEL GREEN STERILE (TOWEL DISPOSABLE) ×2 IMPLANT
TOWEL GREEN STERILE FF (TOWEL DISPOSABLE) ×3 IMPLANT
TRAY FOLEY W/METER SILVER 16FR (SET/KITS/TRAYS/PACK) ×2 IMPLANT
WATER STERILE IRR 1000ML POUR (IV SOLUTION) ×3 IMPLANT

## 2016-11-13 NOTE — Anesthesia Postprocedure Evaluation (Signed)
Anesthesia Post Note  Patient: Kimberly [name]  Procedure(s) Performed: Procedure(s) (LRB): CERVICAL TWO-CERVICAL SIX POSTERIOR CERVICAL FUSION WITH LATERAL MASS FIXATION (N/A)  Patient location during evaluation: PACU Anesthesia Type: General Level of consciousness: awake Pain management: pain level controlled Vital Signs Assessment: post-procedure vital signs reviewed and stable Respiratory status: spontaneous breathing Cardiovascular status: stable Anesthetic complications: no       Last Vitals:  Vitals:   11/13/16 1437 11/13/16 1602  BP: (!) 141/94 (!) 145/97  Pulse: (!) 115 (!) 116  Resp: 18 18  Temp: 36.5 C 36.5 C    Last Pain:  Vitals:   11/13/16 1530  TempSrc:   PainSc: 8                  Shelita Steptoe

## 2016-11-13 NOTE — Op Note (Addendum)
Date of surgery: 11/13/2016 Preoperative diagnosis: Pseudoarthrosis C4-5 C5-6 status post anterior decompression and arthrodesis C4-C6. Status post anterior decompression and fusion C3-4 and C6-T1. Postoperative diagnosis: Same Procedure: Posterior stabilization and fixation from C2-T1 with translaminar and facet screws segmentally, posterior lateral arthrodesis with autograft and infuse C2-T1. Surgeon: Kristeen Miss First assistant: Erline Levine M.D. Anesthesia: Gen. endotracheal Indications: Flornce Record is a 56 year old individual who's had on several occasions previously anterior fusions at C34 and C6-T1. 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 She developed significant degenerative changes at C4-5 and C5-6 and on December 6 underwent anterior decompression and fusion without anterior plating at C45 and C5-6 by Dr. Joya Salm. She is now progressed to develop a significant kyphotic deformity with a pseudoarthrosis at those levels. She's been advised regarding the need for posterior stabilization from C2-T1.  Procedure: Patient was brought to the operating room supine on a stretcher. After the smooth induction of general endotracheal anesthesia, she was placed in a 3 pin head rest and carefully turned in the prone position. Her head was placed in slight extension and this was confirmed radiographically with fluoroscopy.  Fixation was obtained and the back of neck was prepped with alcohol DuraPrep and draped in a sterile fashion after shaving the hair from the back of the neck and the posterior scalp. A vertical incision was created from the suboccipital region down to the spinous process of T1. The dissection was carried down to the cervical dorsal fascia and the fascia was opened in a plane to expose the spinous processes then a subperiosteal dissection was carried out to expose all the facet joints from C2 down to T1. My using towel clips on the spinous processes it was evident that  there was a pseudoarthrosis between C3 and C4 C4 and C5 C5 and C6. C6-C7 and C7-T1 appear to be fused solidly. Nonetheless in order to gain better fixation and fusion hardware was placed into the screw the facets all the way down to the T1 level. At C2 the laminar arch was thickened secondary to the Klippel-Feil deformity that had existed for this purpose translaminar screws were placed at C2 these measured 3.5 x 35 mm in length on each side. Facet screws were then placed at C4-C5 C6-C7 and T1 these measured 3.5 x 14 mm and 3.5 x 12 mm depending on the size of the facet. Once the screws were set the spaces between the lamina were decorticated using a high-speed drill the facets were decorticated at C3-4 C4-5 and C5-6. Bone from the spinous processes was harvested this was put through a bone grinder and then packed into the lateral gutters along with strips of infuse from C2 down to T1. Precontoured 80 mm rods were then further contoured to fit the translaminar screw at C2. The construct at this point was locked in a mutual position. After locking caps were applied final radiographic confirmation was obtained in lateral position which identified restoration of a modest cervical lordosis. With this hemostasis was checked and the soft tissues and then the cervical dorsal fascia was reapproximated with #1 Vicryl in the fascia 2-0 Vicryl was used in subcutaneous tissues and 3-0 Vicryl was used to close subcuticular skin. Dermabond was used on the skin. Patient was then removed from the 3 pin headrest and turned supine for reversal of anesthesia. She'll be placed in a hard cervical collar subsequently. Blood loss was estimated at 150 mL.

## 2016-11-13 NOTE — Progress Notes (Signed)
Orthopedic Tech Progress Note Patient Details:  Kimberly [name] [birth year] [id number]  Patient ID: [name], female   DOB: 03-09-1961, 56 y.o.   MRN: [id number]   [place] Pink 11/13/2016, 1:51 PMCalled [employer] for Hard collar.

## 2016-11-13 NOTE — Transfer of Care (Signed)
Immediate Anesthesia Transfer of Care Note  Patient: Kimberly [name]  Procedure(s) Performed: Procedure(s) with comments: CERVICAL TWO-CERVICAL SIX POSTERIOR CERVICAL FUSION WITH LATERAL MASS FIXATION (N/A) - posterior approach  Patient Location: PACU  Anesthesia Type:General  Level of Consciousness: awake, alert , oriented and patient cooperative  Airway & Oxygen Therapy: Patient Spontanous Breathing and Patient connected to nasal cannula oxygen  Post-op Assessment: Report given to RN and Post -op Vital signs reviewed and stable  Post vital signs: Reviewed and stable  Last Vitals:  Vitals:   11/13/16 0724  BP: (!) 136/92  Pulse: 85  Resp: 20  Temp: 36.5 C    Last Pain:  Vitals:   11/13/16 0724  TempSrc: Oral  PainSc: 7          Complications: No apparent anesthesia complications

## 2016-11-13 NOTE — Anesthesia Procedure Notes (Signed)
Procedure Name: Intubation Date/Time: 11/13/2016 9:39 AM Performed by: Shirlyn Goltz Pre-anesthesia Checklist: Patient identified, Emergency Drugs available, Suction available and Patient being monitored Patient Re-evaluated:Patient Re-evaluated prior to inductionOxygen Delivery Method: Circle system utilized Preoxygenation: Pre-oxygenation with 100% oxygen Intubation Type: IV induction Ventilation: Mask ventilation with difficulty, Oral airway inserted - appropriate to patient size and Two handed mask ventilation required Laryngoscope Size: Glidescope and 3 Grade View: Grade II Tube type: Oral Tube size: 7.0 mm Number of attempts: 1 (oett needed significan J shape due to anterior larynx and limited neck ROM) Airway Equipment and Method: Video-laryngoscopy and Rigid stylet Placement Confirmation: ETT inserted through vocal cords under direct vision,  positive ETCO2 and breath sounds checked- equal and bilateral Secured at: 22 cm Tube secured with: Tape Dental Injury: Teeth and Oropharynx as per pre-operative assessment  Difficulty Due To: Difficulty was anticipated, Difficult Airway- due to anterior larynx, Difficult Airway- due to limited oral opening and Difficult Airway- due to reduced neck mobility

## 2016-11-13 NOTE — Evaluation (Signed)
Physical Therapy Evaluation Patient Details Name: Kimberly [name] MRN: [id number] DOB: May 12, 1961 Today's Date: 11/13/2016   History of Present Illness  Pt is a 56 y.o. female now s/p posterior stab and fixation from C2-T1 on 11/13/16. Pertinent PMH includes HTN, depression, brain cyst, asthma, arthritis, anxiety. Surgical hx includes fusion C3-4/C6-T1 (2016), and revision with fusion C4-6 (06/2016).   Clinical Impression  Pt presents to PT with increased pain and an overall decrease in functional mobility secondary to above. PTA, pt indep with all mobility and lives at home with husband and son who help with cooking and cleaning secondary to increased neck pain; family will be available for 24-7 supervision if needed. Today, pt able to stand and perform pre-gait marching at bedside with min guard for balance; further mobility limited secondary to increased pain despite max encouragement. Pt educ on importance of mobility and cervical precautions. Will benefit from continued acute PT services to maximize functional mobility and independence prior to d/c home.     Follow Up Recommendations No PT follow up    Equipment Recommendations  None recommended by PT    Recommendations for Other Services OT consult     Precautions / Restrictions Precautions Precautions: Cervical;Fall Precaution Comments: Precautions reviewed Required Braces or Orthoses: Cervical Brace Cervical Brace: Hard collar;At all times Restrictions Weight Bearing Restrictions: No      Mobility  Bed Mobility Overal bed mobility: Needs Assistance Bed Mobility: Supine to Sit;Sit to Supine     Supine to sit: HOB elevated;Min assist Sit to supine: HOB elevated;Min guard   General bed mobility comments: Bed mob with minA for UE support into sitting.   Transfers Overall transfer level: Needs assistance Equipment used: None Transfers: Sit to/from Stand Sit to Stand: Min guard         General transfer comment:  Stood with min guard for balance; no physical assist required.  Ambulation/Gait             General Gait Details: Pt denied further mobility secondary to increased pain  Stairs            Wheelchair Mobility    Modified Rankin (Stroke Patients Only)       Balance Overall balance assessment: Needs assistance Sitting-balance support: No upper extremity supported;Feet supported Sitting balance-Leahy Scale: Fair Sitting balance - Comments: Min guard for seated balance secondary to increased pain in sitting     Standing balance-Leahy Scale: Fair Standing balance comment: Performed pre-gait BLE marching x10 standing at bedside with min guard for balance.                              Pertinent Vitals/Pain Pain Assessment: 0-10 Pain Score: 8  Pain Location: Neck Pain Descriptors / Indicators: Aching;Constant Pain Intervention(s): Limited activity within patient's tolerance;Monitored during session;Repositioned    Home Living Family/patient expects to be discharged to:: Private residence Living Arrangements: Spouse/significant other Available Help at Discharge: Family;Available 24 hours/day Type of Home: House Home Access: Stairs to enter Entrance Stairs-Rails: None Entrance Stairs-Number of Steps: 3 Home Layout: One level Home Equipment: None      Prior Function Level of Independence: Independent         Comments: Indep with mobility; husband assists with cleaning and son cooks secondary to pt's increased pain.      Hand Dominance        Extremity/Trunk Assessment   Upper Extremity Assessment Upper Extremity Assessment: Generalized weakness  Lower Extremity Assessment Lower Extremity Assessment: Overall WFL for tasks assessed       Communication   Communication: No difficulties  Cognition Arousal/Alertness: Awake/alert Behavior During Therapy: WFL for tasks assessed/performed Overall Cognitive Status: Within Functional Limits  for tasks assessed                                        General Comments General comments (skin integrity, edema, etc.): Asymptomatic with mobility    Exercises     Assessment/Plan    PT Assessment Patient needs continued PT services  PT Problem List Decreased balance;Decreased activity tolerance;Decreased knowledge of use of DME;Decreased knowledge of precautions;Decreased mobility;Pain       PT Treatment Interventions DME instruction;Therapeutic exercise;Balance training;Gait training;Stair training;Functional mobility training;Therapeutic activities;Patient/family education    PT Goals (Current goals can be found in the Care Plan section)  Acute Rehab PT Goals Patient Stated Goal: Return home PT Goal Formulation: With patient Time For Goal Achievement: 11/27/16 Potential to Achieve Goals: Good    Frequency Min 5X/week   Barriers to discharge        Co-evaluation               End of Session Equipment Utilized During Treatment: Cervical collar Activity Tolerance: Patient limited by pain Patient left: in bed;with call bell/phone within reach;with SCD's reapplied Nurse Communication: Mobility status PT Visit Diagnosis: Other abnormalities of gait and mobility (R26.89);Pain Pain - part of body:  (Neck)    Time: 6759-1638 PT Time Calculation (min) (ACUTE ONLY): 14 min   Charges:   PT Evaluation $PT Eval Low Complexity: 1 Procedure     PT G Codes:       Enis Gash, SPT Office-[phone]  Mabeline Caras 11/13/2016, 5:18 PM

## 2016-11-13 NOTE — H&P (Signed)
Kimberly [name] is an 56 y.o. female.   Chief Complaint: Pseudoarthrosis of the cervical spine HPI: [name] is a 56 year old lady who has had significant cervical spondylosis in the past. She's had a fusion at C3-4 and also at C6-T1. In December she was seen and evaluated by Dr. Joya Salm who recommended anterior fusion at C4-5 and C5-6. He did perform this operation without anterior fixation. Since that time patient clearly is evolving a pseudoarthrosis with further kyphotic deformity at C45 and C5-6. She's been advised regarding the need for posterior stabilization with fixational from C2 down to T1 in an effort to restore some lordosis and also to promote fusion of this region. She is now admitted for that surgery.  Past Medical History:  Diagnosis Date  . Anxiety    takes alprazolam - rare use   . Arthritis    cervical spondylosis   . Asthma    SEES  PULM W/ [place]    . Balance problem   . Brain cyst   . Depression   . Difficult intubation    Small mouth opening, limited neck flexion, very anterior   . GERD (gastroesophageal reflux disease)    pt. reports that its better, no meds in use at this time- 2015  . Headache   . Herpes   . History of kidney stones   . History of pneumonia   . Hypertension    pt. doesn't see cardiologist, followed for HTN by Dr. Gerarda Fraction  . Inflammation of shoulder joint   . Memory difficulties   . Pneumothorax on right    following C4-6 ACDF 07/04/16  . PONV (postoperative nausea and vomiting)    WITH SURGERY 07/04/16 (NECK) RT LUNG COLLAPSED  . Recurrent falls   . Shortness of breath dyspnea    WITH EXERTION   . Urinary frequency     Past Surgical History:  Procedure Laterality Date  . ABDOMINAL HYSTERECTOMY    . ANTERIOR CERVICAL DECOMP/DISCECTOMY FUSION N/A 08/06/2014   Procedure: ANTERIOR CERVICAL DECOMPRESSION/DISCECTOMY FUSION CERVICAL THREE-FOUR,CERVICAL SIX-SEVEN ,CERVICAL SEVEN-THORACIC ONE;  Surgeon: Floyce Stakes, MD;  Location: [place];   Service: Neurosurgery;  Laterality: N/A;  . ANTERIOR CERVICAL DECOMP/DISCECTOMY FUSION N/A 07/04/2016   Procedure: CERVICAL FOUR-FIVE, CERVICAL FIVE-SIX ANTERIOR CERVICAL DECOMPRESSION/DISCECTOMY/FUSION WITH REVISION OF CERVICAL THREE-FOUR PLATE;  Surgeon: Leeroy Cha, MD;  Location: [place];  Service: Neurosurgery;  Laterality: N/A;  . CARPAL TUNNEL RELEASE Left 02/16/2013   Procedure: CARPAL TUNNEL RELEASE;  Surgeon: Carole Civil, MD;  Location: [facility];  Service: Orthopedics;  Laterality: Left;  . CARPAL TUNNEL RELEASE Right 03/27/2013   Procedure: RIGHT CARPAL TUNNEL RELEASE;  Surgeon: Carole Civil, MD;  Location: [facility];  Service: Orthopedics;  Laterality: Right;  . CESAREAN SECTION     x2  . CHOLECYSTECTOMY N/A 06/17/2015   Procedure: LAPAROSCOPIC CHOLECYSTECTOMY;  Surgeon: Aviva Signs, MD;  Location: [facility];  Service: General;  Laterality: N/A;  . COLONOSCOPY    . FOREIGN BODY REMOVAL Left    knee-as child  . NASAL SINUS SURGERY N/A 04/13/2015   Procedure: nasal endoscopy with adenoid biopsy;  Surgeon: Ruby Cola, MD;  Location: [place];  Service: ENT;  Laterality: N/A;  . NECK SURGERY  2016  . ROTATOR CUFF REPAIR Left   . SHOULDER ACROMIOPLASTY Left 05/18/2015   Procedure: SHOULDER ACROMIOPLASTY;  Surgeon: Earlie Server, MD;  Location: [place];  Service: Orthopedics;  Laterality: Left;  . SHOULDER ARTHROSCOPY WITH DISTAL CLAVICLE RESECTION Left 05/18/2015  Procedure: LEFT SHOULDER ARTHROSCOPY WITH  DISTAL CLAVICLE RESECTION;  Surgeon: Earlie Server, MD;  Location: [name];  Service: Orthopedics;  Laterality: Left;    Family History  Problem Relation Age of Onset  . Hypertension Father   . Hypertension Other    Social History:  reports that she has never smoked. She has never used smokeless tobacco. She reports that she does not drink alcohol or use drugs.  Allergies:  Allergies  Allergen Reactions  . Shellfish Allergy  Anaphylaxis and Hives    "Swells throat up"  . Peanut-Containing Drug Products Hives  . Codeine Other (See Comments)    jitters    Medications Prior to Admission  Medication Sig Dispense Refill  . acyclovir (ZOVIRAX) 400 MG tablet Take 400 mg by mouth 2 (two) times daily.     Marland Kitchen albuterol (PROVENTIL HFA;VENTOLIN HFA) 108 (90 Base) MCG/ACT inhaler Inhale 2 puffs into the lungs every 4 (four) hours as needed for wheezing or shortness of breath. 1 Inhaler 2  . amLODipine (NORVASC) 5 MG tablet Take 5 mg by mouth daily.     Marland Kitchen aspirin EC 81 MG tablet Take 81 mg by mouth daily.    . cholecalciferol (VITAMIN D) 1000 units tablet Take 1,000 Units by mouth daily.    . clotrimazole (LOTRIMIN) 1 % cream Apply 1 application topically daily.     . cyclobenzaprine (FLEXERIL) 10 MG tablet Take 1 tablet by mouth as needed for muscle spasms.     . diazepam (VALIUM) 10 MG tablet Take 10 mg by mouth daily as needed for anxiety.    Marland Kitchen escitalopram (LEXAPRO) 10 MG tablet Take 10 mg by mouth daily.    Marland Kitchen estradiol (ESTRACE) 1 MG tablet Take 1 mg by mouth daily.     . nortriptyline (PAMELOR) 10 MG capsule Take 1 capsule by mouth at bedtime as needed for sleep.     Marland Kitchen oxybutynin (DITROPAN-XL) 5 MG 24 hr tablet Take 5 mg by mouth daily as needed (bladder spasms).     . Oxycodone HCl 10 MG TABS Take 10 mg by mouth 2 (two) times daily.  0  . pantoprazole (PROTONIX) 40 MG tablet Take 40 mg by mouth daily.    Marland Kitchen acetaminophen (TYLENOL) 500 MG tablet Take 500 mg by mouth 2 (two) times daily as needed for headache.    . budesonide-formoterol (SYMBICORT) 160-4.5 MCG/ACT inhaler Inhale 2 puffs into the lungs 2 (two) times daily. (Patient not taking: Reported on 10/31/2016) 1 Inhaler 5  . Tetrahydrozoline HCl (VISINE OP) Apply 1 drop to eye daily as needed (irritation).      No results found for this or any previous visit (from the past 48 hour(s)). No results found.  Review of Systems  Constitutional: Negative.   HENT:  Negative.   Eyes: Negative.   Respiratory: Negative.   Cardiovascular: Negative.   Gastrointestinal: Negative.   Genitourinary: Negative.   Musculoskeletal: Positive for neck pain.  Skin: Negative.   Neurological: Negative.   Endo/Heme/Allergies: Negative.   Psychiatric/Behavioral: Negative.     Blood pressure (!) 136/92, pulse 85, temperature 97.7 F (36.5 C), temperature source Oral, resp. rate 20, SpO2 100 %. Physical Exam  Constitutional: She is oriented to person, place, and time. She appears well-developed and well-nourished.  HENT:  Head: Normocephalic and atraumatic.  Eyes: Conjunctivae and EOM are normal. Pupils are equal, round, and reactive to light.  Neck: Normal range of motion. Neck supple.  Cardiovascular: Normal rate and regular rhythm.  Respiratory: Effort normal and breath sounds normal.  GI: Soft. Bowel sounds are normal.  Musculoskeletal:  Significant neck pain palpation posteriorly and posterior laterally pain over the sternocleidomastoids tenderness in the supraclavicular fossa is bilaterally no masses are noted.  Neurological: She is alert and oriented to person, place, and time. She has normal reflexes.  Skin: Skin is warm and dry.  Psychiatric: She has a normal mood and affect. Her behavior is normal. Judgment and thought content normal.     Assessment/Plan Arthrosis C45 and C5-6, chronic neck pain.  Posterior fixation C2-T1 with posterior lateral arthrodesis  Earleen Newport, MD 11/13/2016, 7:41 AM

## 2016-11-14 ENCOUNTER — Encounter (HOSPITAL_COMMUNITY): Payer: Self-pay | Admitting: Neurological Surgery

## 2016-11-14 ENCOUNTER — Inpatient Hospital Stay (HOSPITAL_COMMUNITY): Payer: Medicare Other

## 2016-11-14 MED ORDER — DEXAMETHASONE 4 MG PO TABS
2.0000 mg | ORAL_TABLET | Freq: Two times a day (BID) | ORAL | Status: DC
  Administered 2016-11-14 – 2016-11-15 (×2): 2 mg via ORAL
  Filled 2016-11-14 (×2): qty 1

## 2016-11-14 MED FILL — Thrombin For Soln 5000 Unit: CUTANEOUS | Qty: 5000 | Status: AC

## 2016-11-14 NOTE — Progress Notes (Signed)
Patient ID: Kimberly [name], female   DOB: Nov 07, 1960, 56 y.o.   MRN: [id number] Vital signs are stable Patient is ambulating Pain remains an 8 of 10 Will check plain x-rays Add steroid for pain

## 2016-11-14 NOTE — Evaluation (Addendum)
Occupational Therapy Evaluation Patient Details Name: Kimberly [name] MRN: [id number] DOB: 03/30/1961 Today's Date: 11/14/2016    History of Present Illness Pt is a 56 y.o. female now s/p posterior stab and fixation from C2-T1 on 11/13/16. Pertinent PMH includes HTN, depression, brain cyst, asthma, arthritis, anxiety. Surgical hx includes fusion C3-4/C6-T1 (2016), and revision with fusion C4-6 (06/2016).    Clinical Impression   Patient is s/p C2-T1 surgery resulting in functional limitations due to the deficits listed below (see OT problem list). PTA was mod I PTA per patient.  Patient will benefit from skilled OT acutely to increase independence and safety with ADLS to allow discharge HOme with family.     Follow Up Recommendations  No OT follow up    Equipment Recommendations  None recommended by OT    Recommendations for Other Services       Precautions / Restrictions Precautions Precautions: Cervical;Fall Precaution Comments: Precautions reviewed Required Braces or Orthoses: Cervical Brace Cervical Brace: Hard collar;At all times      Mobility Bed Mobility               General bed mobility comments: sitting eob on arrival  Transfers Overall transfer level: Needs assistance Equipment used: None Transfers: Sit to/from Stand Sit to Stand: Supervision         General transfer comment: pt reaching for door frame entering bathroom    Balance                                           ADL either performed or assessed with clinical judgement   ADL Overall ADL's : Needs assistance/impaired Eating/Feeding: Independent   Grooming: Wash/dry hands;Supervision/safety;Standing Grooming Details (indicate cue type and reason): static standing at sink and cues to washing around incision Upper Body Bathing: Supervision/ safety   Lower Body Bathing: Total assistance Lower Body Bathing Details (indicate cue type and reason): pt states "you do it for  me" but pt is able to cross bil LE    Upper Body Dressing Details (indicate cue type and reason): pt requires max (A) to don doff brace and when cued to participate states "you do it"     Toilet Transfer: Supervision/safety   Toileting- Clothing Manipulation and Hygiene: Supervision/safety Toileting - Clothing Manipulation Details (indicate cue type and reason): static standing and using wash cloth for peri care     Functional mobility during ADLs: Supervision/safety General ADL Comments: pt reports spouse works and she will have (A) from him at d/c  Pt educated on bathing and avoid washing directly on incision. Pt educated to use new wash cloth and towel each day. Pt educated to allow water to run across dressing and not to soak in a tub at this time. Pt advised RN will instruct on any bandages required otherwise is open to air.      Vision         Perception     Praxis      Pertinent Vitals/Pain Pain Assessment: 0-10 Pain Score: 10-Worst pain ever Pain Location: Neck Pain Descriptors / Indicators: Aching;Constant Pain Intervention(s): Limited activity within patient's tolerance;Monitored during session;Premedicated before session;Repositioned     Hand Dominance Right   Extremity/Trunk Assessment Upper Extremity Assessment Upper Extremity Assessment: Overall WFL for tasks assessed   Lower Extremity Assessment Lower Extremity Assessment: Defer to PT evaluation   Cervical / Trunk Assessment Cervical /  Trunk Assessment: Other exceptions Cervical / Trunk Exceptions: s/p cervical   Communication Communication Communication: No difficulties   Cognition Arousal/Alertness: Awake/alert Behavior During Therapy: WFL for tasks assessed/performed Overall Cognitive Status: Within Functional Limits for tasks assessed                                     General Comments       Exercises     Shoulder Instructions      Home Living Family/patient expects to  be discharged to:: Private residence Living Arrangements: Spouse/significant other Available Help at Discharge: Family;Available 24 hours/day Type of Home: House Home Access: Stairs to enter [employer] of Steps: 3 Entrance Stairs-Rails: None Home Layout: One level     Bathroom Shower/Tub: Teacher, early years/pre: Standard     Home Equipment: None          Prior Functioning/Environment Level of Independence: Independent        Comments: Indep with mobility; husband assists with cleaning and son cooks secondary to pt's increased pain.         OT Problem List: Decreased strength;Decreased activity tolerance;Impaired balance (sitting and/or standing);Decreased safety awareness      OT Treatment/Interventions: Self-care/ADL training;Therapeutic exercise;DME and/or AE instruction;Therapeutic activities;Patient/family education;Balance training    OT Goals(Current goals can be found in the care plan section) Acute Rehab OT Goals Patient Stated Goal: Return home OT Goal Formulation: With patient Time For Goal Achievement: 11/28/16 Potential to Achieve Goals: Good  OT Frequency: Min 2X/week   Barriers to D/C:            Co-evaluation              End of Session Equipment Utilized During Treatment: Cervical collar Nurse Communication: Mobility status;Precautions  Activity Tolerance: Patient tolerated treatment well Patient left: in chair;with call bell/phone within reach  OT Visit Diagnosis: Unsteadiness on feet (R26.81)                Time: 3212-2482 OT Time Calculation (min): 13 min Charges:  OT General Charges $OT Visit: 1 Procedure OT Evaluation $OT Eval Moderate Complexity: 1 Procedure G-Codes:      Jeri Modena   OTR/L Pager: [phone] Office: [phone] .   Parke Poisson B 11/14/2016, 8:56 AM

## 2016-11-14 NOTE — Progress Notes (Signed)
Physical Therapy Treatment Patient Details Name: Kimberly [name] MRN: [id number] DOB: 1961/01/13 Today's Date: 11/14/2016    History of Present Illness Pt is a 56 y.o. female now s/p posterior cervical fusion from C2-C6 on 11/13/16. Pertinent PMH includes HTN, depression, brain cyst, asthma, arthritis, anxiety. Surgical hx includes fusion C3-4/C6-T1, and revision with fusion C4-6.    PT Comments    Pt progressing towards physical therapy goals. Was able to perform transfers and ambulation with gross min guard to supervision for safety. Pt initially asking PT to return after lunch but was eventually agreeable with encouragement. Will continue to follow and progress as able per POC.    Follow Up Recommendations  No PT follow up     Equipment Recommendations  None recommended by PT    Recommendations for Other Services       Precautions / Restrictions Precautions Precautions: Cervical;Fall Precaution Comments: Precautions reviewed Required Braces or Orthoses: Cervical Brace Cervical Brace: Hard collar;At all times Restrictions Weight Bearing Restrictions: No    Mobility  Bed Mobility Overal bed mobility: Needs Assistance Bed Mobility: Rolling;Sidelying to Sit;Sit to Sidelying Rolling: Supervision Sidelying to sit: Min guard     Sit to sidelying: Min guard General bed mobility comments: Multimodal cues for proper log roll technique. Gross min guard assist provided to maintain.   Transfers Overall transfer level: Needs assistance Equipment used: None Transfers: Sit to/from Stand Sit to Stand: Supervision         General transfer comment: Pt reaching for therapist's hand to pull her to stand, however was able to complete without assist when cued.   Ambulation/Gait Ambulation/Gait assistance: Min guard Ambulation Distance (Feet): 200 Feet Assistive device: None Gait Pattern/deviations: Step-through pattern;Decreased stride length;Trunk flexed Gait velocity:  Decreased Gait velocity interpretation: Below normal speed for age/gender General Gait Details: VC's for improved posture and natural arm swing. Pt reports feeling better during ambulation.    Stairs Stairs: Yes   Stair Management: One rail Right;One rail Left;Step to pattern;Forwards Number of Stairs: 3 General stair comments: Pt was able to complete with railing on one side and therapist's hand on the other for support. HHA only, no assist provided.   Wheelchair Mobility    Modified Rankin (Stroke Patients Only)       Balance Overall balance assessment: Needs assistance Sitting-balance support: No upper extremity supported;Feet supported Sitting balance-Leahy Scale: Fair       Standing balance-Leahy Scale: Fair                              Cognition Arousal/Alertness: Awake/alert Behavior During Therapy: WFL for tasks assessed/performed Overall Cognitive Status: Within Functional Limits for tasks assessed                                        Exercises      General Comments        Pertinent Vitals/Pain Pain Assessment: Faces Faces Pain Scale: Hurts little more Pain Location: Neck Pain Descriptors / Indicators: Aching;Constant Pain Intervention(s): Monitored during session;Limited activity within patient's tolerance;Repositioned    Home Living                      Prior Function            PT Goals (current goals can now be found in the care plan section)  Acute Rehab PT Goals Patient Stated Goal: Return home PT Goal Formulation: With patient Time For Goal Achievement: 11/27/16 Potential to Achieve Goals: Good Progress towards PT goals: Progressing toward goals    Frequency    Min 5X/week      PT Plan Current plan remains appropriate    Co-evaluation             End of Session Equipment Utilized During Treatment: Cervical collar Activity Tolerance: Patient limited by pain Patient left: in  bed;with call bell/phone within reach Nurse Communication: Mobility status PT Visit Diagnosis: Other abnormalities of gait and mobility (R26.89);Pain Pain - part of body:  (Neck)     Time: 9924-2683 PT Time Calculation (min) (ACUTE ONLY): 19 min  Charges:  $Gait Training: 8-22 mins                    G Codes:       Kimberly [name], PT, DPT Acute Rehabilitation Services Pager: [phone]    Kimberly [name] 11/14/2016, 12:15 PM

## 2016-11-15 MED ORDER — METHOCARBAMOL 500 MG PO TABS: 500.0000 mg | ORAL_TABLET | Freq: Four times a day (QID) | ORAL | 5 refills | Status: DC | PRN

## 2016-11-15 MED ORDER — DEXAMETHASONE 1 MG PO TABS: ORAL_TABLET | ORAL | 0 refills | Status: DC

## 2016-11-15 MED ORDER — OXYCODONE HCL 10 MG PO TABS: 10.0000 mg | ORAL_TABLET | ORAL | 0 refills | Status: DC | PRN

## 2016-11-15 NOTE — Progress Notes (Signed)
Patient ID: Kimberly [name], female   DOB: 1960-10-19, 56 y.o.   MRN: [id number] Signs are stable Postoperative x-rays looked good Clinically patient's pain is improving down to a 7 Plan discharge home today Continue Decadron taper

## 2016-11-15 NOTE — Progress Notes (Signed)
Physical Therapy Treatment Patient Details Name: Kimberly [name] MRN: [id number] DOB: 04/29/1961 Today's Date: 11/15/2016    History of Present Illness Pt is a 56 y.o. female now s/p posterior cervical fusion from C2-C6 on 11/13/16. Pertinent PMH includes HTN, depression, brain cyst, asthma, arthritis, anxiety. Surgical hx includes fusion C3-4/C6-T1, and revision with fusion C4-6.    PT Comments    Pt progressing toward PT goals, requiring supervision and light cueing for most mobility.  She was educated on precautions and safe functional movements, as well as cane management during ambulation and stair training.  She lives with her husband who is able to provide 24 hour support.  Recommendations remain appropriate. Will follow acutely.   Follow Up Recommendations  No PT follow up;Supervision for mobility/OOB     Equipment Recommendations  Cane (pt received cane in room)    Recommendations for Other Services       Precautions / Restrictions Precautions Precautions: Cervical;Fall Precaution Comments: Precautions reviewed Required Braces or Orthoses: Cervical Brace Cervical Brace: Hard collar;At all times Restrictions Weight Bearing Restrictions: No    Mobility  Bed Mobility Overal bed mobility: Needs Assistance Bed Mobility: Rolling;Sidelying to Sit Rolling: Supervision Sidelying to sit: Supervision       General bed mobility comments: Cues required for log roll technique  Transfers Overall transfer level: Needs assistance Equipment used: None Transfers: Sit to/from Stand Sit to Stand: Supervision         General transfer comment: Light cues required for hand placement and precaution maintenance  Ambulation/Gait Ambulation/Gait assistance: Supervision Ambulation Distance (Feet): 250 Feet Assistive device: 1 person hand held assist;Straight cane Gait Pattern/deviations: Step-through pattern;Decreased stride length;Trunk flexed Gait velocity: Decreased Gait  velocity interpretation: Below normal speed for age/gender General Gait Details: Supervision for safety because pt appeared drowsy.  Very slow.  Pt grabbed furniture/walls in room so +1 hand held assist used for part of walk.  Cane offered and used for majority of ambulation. Pt states she feels more stable and confident using it.  Pt appeared more stable with cane.   Stairs Stairs: Yes   Stair Management: One rail Right;Step to pattern Number of Stairs: 4 General stair comments: Pt used cane on stairs and cueing required for sequencing/cane management. Husband present for stair training.  Wheelchair Mobility    Modified Rankin (Stroke Patients Only)       Balance Overall balance assessment: Needs assistance Sitting-balance support: No upper extremity supported;Feet supported Sitting balance-Leahy Scale: Fair     Standing balance support: Single extremity supported Standing balance-Leahy Scale: Fair                              Cognition Arousal/Alertness: Awake/alert Behavior During Therapy: WFL for tasks assessed/performed Overall Cognitive Status: Within Functional Limits for tasks assessed                                        Exercises      General Comments General comments (skin integrity, edema, etc.): Husband present this session and very hard of hearing.      Pertinent Vitals/Pain Pain Assessment: 0-10 Pain Score: 7  Pain Location: Neck Pain Descriptors / Indicators: Aching;Constant Pain Intervention(s): Monitored during session;Repositioned    Home Living  Prior Function            PT Goals (current goals can now be found in the care plan section) Acute Rehab PT Goals Patient Stated Goal: Return home PT Goal Formulation: With patient Time For Goal Achievement: 11/27/16 Potential to Achieve Goals: Good Progress towards PT goals: Progressing toward goals    Frequency    Min  5X/week      PT Plan Current plan remains appropriate    Co-evaluation             End of Session Equipment Utilized During Treatment: Gait belt;Cervical collar Activity Tolerance: Patient tolerated treatment well Patient left: in chair;with call bell/phone within reach;with family/visitor present Nurse Communication: Mobility status PT Visit Diagnosis: Other abnormalities of gait and mobility (R26.89);Pain Pain - part of body:  (neck)     Time: 1111-1130 PT Time Calculation (min) (ACUTE ONLY): 19 min  Charges:                       G Codes:       Kimberly [name] SPT  Kimberly [name] 11/15/2016, 2:53 PM

## 2016-11-15 NOTE — Progress Notes (Signed)
Discharge instructions given to patient and her husband. All questions answered regarding site care and activity level. Prescriptions given to patient to have filled at her pharmacy. Cane provided at discharge. A volunteer transported the patient to her car for her husband to drive her home.

## 2016-11-15 NOTE — Progress Notes (Signed)
Occupational Therapy Treatment Patient Details Name: Kimberly [name] MRN: [id number] DOB: March 16, 1961 Today's Date: 11/15/2016    History of present illness Pt is a 56 y.o. Kimberly now s/p posterior cervical fusion from C2-C6 on 11/13/16. Pertinent PMH includes HTN, depression, brain cyst, asthma, arthritis, anxiety. Surgical hx includes fusion C3-4/C6-T1, and revision with fusion C4-6.   OT comments  Pt making good progress towards OT goals this session. Maintenance of precautions stressed this session, and family education for don/doff of brace emphasized as well as compensatory strategies for ADL. Pt and husband with no questions/concerns at the end of session. RN notified that Pt is dressed and ready to go home from OT perspective.   Follow Up Recommendations  No OT follow up    Equipment Recommendations  None recommended by OT    Recommendations for Other Services      Precautions / Restrictions Precautions Precautions: Cervical;Fall Precaution Comments: Precautions reviewed Required Braces or Orthoses: Cervical Brace Cervical Brace: Hard collar;At all times Restrictions Weight Bearing Restrictions: No       Mobility Bed Mobility Overal bed mobility: Needs Assistance Bed Mobility: Rolling;Sidelying to Sit Rolling: Supervision Sidelying to sit: Supervision       General bed mobility comments: Cues required for log roll technique  Transfers Overall transfer level: Needs assistance Equipment used: None Transfers: Sit to/from Stand Sit to Stand: Supervision         General transfer comment: Light cues required for hand placement and precaution maintenance    Balance Overall balance assessment: Needs assistance Sitting-balance support: No upper extremity supported;Feet supported Sitting balance-Leahy Scale: Fair Sitting balance - Comments: able to perform dressing in seated position   Standing balance support: Single extremity supported Standing balance-Leahy  Scale: Fair Standing balance comment: Pt using "furniture walking" to navigate room                           ADL either performed or assessed with clinical judgement   ADL Overall ADL's : Needs assistance/impaired     Grooming: Wash/dry hands;Supervision/safety;Standing Grooming Details (indicate cue type and reason): sink level         Upper Body Dressing : Moderate assistance;With caregiver independent assisting;Sitting Upper Body Dressing Details (indicate cue type and reason): Pt donned bathrobe for home with assist from caregiver, and husband donned brace with verbal cues from therapist. Pt and husband had no questions or concerns about how to don brace after practice Lower Body Dressing: Sitting/lateral leans;Supervision/safety Lower Body Dressing Details (indicate cue type and reason): to don underwear Toilet Transfer: Supervision/safety;Ambulation;Comfort height toilet;Grab bars   Toileting- Clothing Manipulation and Hygiene: Supervision/safety Toileting - Clothing Manipulation Details (indicate cue type and reason): static standing and using wash cloth for peri care     Functional mobility during ADLs: Supervision/safety General ADL Comments: Husband present and received education on precautions and ADL compensatory strategies     Vision       Perception     Praxis      Cognition Arousal/Alertness: Awake/alert Behavior During Therapy: WFL for tasks assessed/performed Overall Cognitive Status: Within Functional Limits for tasks assessed                                          Exercises     Shoulder Instructions       General Comments Husband present this  session and very hard of hearing.    Pertinent Vitals/ Pain       Pain Assessment: 0-10 Pain Score: 7  Pain Location: Neck Pain Descriptors / Indicators: Aching;Constant Pain Intervention(s): Monitored during session;Repositioned  Home Living                                           Prior Functioning/Environment              Frequency  Min 2X/week        Progress Toward Goals  OT Goals(current goals can now be found in the care plan section)  Progress towards OT goals: Progressing toward goals  Acute Rehab OT Goals Patient Stated Goal: Return home OT Goal Formulation: With patient Time For Goal Achievement: 11/28/16 Potential to Achieve Goals: Good  Plan Discharge plan remains appropriate    Co-evaluation                 End of Session Equipment Utilized During Treatment: Cervical collar  OT Visit Diagnosis: Unsteadiness on feet (R26.81)   Activity Tolerance Patient tolerated treatment well   Patient Left in bed;with call bell/phone within reach;with family/visitor present   Nurse Communication Mobility status;Precautions        Time: 9450-3888 OT Time Calculation (min): 15 min  Charges: OT General Charges $OT Visit: 1 Procedure OT Treatments $Self Care/Home Management : 8-22 mins  Hulda Humphrey OTR/L [phone]   Kimberly [name] 11/15/2016, 3:41 PM

## 2016-11-15 NOTE — Discharge Summary (Signed)
Physician Discharge Summary  Patient ID: Kimberly [name] MRN: [id number] DOB/AGE: 09-11-1960 56 y.o.  Admit date: 11/13/2016 Discharge date: 11/15/2016  Admission Diagnoses:Pseudoarthrosis C4-5 C5-6 status post anterior decompression and arthrodesis with allograft  Discharge Diagnoses: Pseudoarthrosis C4-5 C5-6 status post anterior decompression arthrodesis with allograft Active Problems:   Pseudoarthrosis of cervical spine ([place])   Discharged Condition: good  Hospital Course: Patient was admitted to undergo surgical stabilization and arthrodesis posteriorly having had a previous anterior procedure some 3 months ago she is developing a pseudoarthrosis with a kyphosis at the levels of C45 and C5-6 and it was deemed appropriate to stabilize her posteriorly from C2-T1 to gain additional surface for arthrodesis and prevent further kyphotic deformity.  Consults: None  Significant Diagnostic Studies: None  Treatments: surgery: Posterior arthrodesis C2-T1 with translaminar and facet screws arthrodesis with autograft and infuse  Discharge Exam: Blood pressure 94/66, pulse 100, temperature 98.3 F (36.8 C), temperature source Oral, resp. rate 18, SpO2 93 %. Incision is clean and dry motor function is intact in the upper and lower extremities station and gait are intact  Disposition: 06-Home-Health Care Svc  Discharge Instructions    Call MD for:  redness, tenderness, or signs of infection (pain, swelling, redness, odor or green/yellow discharge around incision site)    Complete by:  As directed    Call MD for:  severe uncontrolled pain    Complete by:  As directed    Call MD for:  temperature >100.4    Complete by:  As directed    Diet - low sodium heart healthy    Complete by:  As directed    Discharge instructions    Complete by:  As directed    Okay to shower. Do not apply salves or appointments to incision. No heavy lifting with the upper extremities greater than 15 pounds. May  resume driving when not requiring pain medication and patient feels comfortable with doing so.   Incentive spirometry RT    Complete by:  As directed    Increase activity slowly    Complete by:  As directed      Allergies as of 11/15/2016      Reactions   Shellfish Allergy Anaphylaxis, Hives   "Swells throat up"   Peanut-containing Drug Products Hives   Codeine Other (See Comments)   jitters      Medication List    TAKE these medications   acetaminophen 500 MG tablet Commonly known as:  TYLENOL Take 500 mg by mouth 2 (two) times daily as needed for headache.   acyclovir 400 MG tablet Commonly known as:  ZOVIRAX Take 400 mg by mouth 2 (two) times daily.   albuterol 108 (90 Base) MCG/ACT inhaler Commonly known as:  PROVENTIL HFA;VENTOLIN HFA Inhale 2 puffs into the lungs every 4 (four) hours as needed for wheezing or shortness of breath.   amLODipine 5 MG tablet Commonly known as:  NORVASC Take 5 mg by mouth daily.   aspirin EC 81 MG tablet Take 81 mg by mouth daily.   budesonide-formoterol 160-4.5 MCG/ACT inhaler Commonly known as:  SYMBICORT Inhale 2 puffs into the lungs 2 (two) times daily.   cholecalciferol 1000 units tablet Commonly known as:  VITAMIN D Take 1,000 Units by mouth daily.   clotrimazole 1 % cream Commonly known as:  LOTRIMIN Apply 1 application topically daily.   cyclobenzaprine 10 MG tablet Commonly known as:  FLEXERIL Take 1 tablet by mouth as needed for muscle spasms.   dexamethasone  1 MG tablet Commonly known as:  DECADRON 2 tablets twice daily for 2 days, one tablet twice daily for 2 days, one tablet daily for 2 days.   diazepam 10 MG tablet Commonly known as:  VALIUM Take 10 mg by mouth daily as needed for anxiety.   escitalopram 10 MG tablet Commonly known as:  LEXAPRO Take 10 mg by mouth daily.   estradiol 1 MG tablet Commonly known as:  ESTRACE Take 1 mg by mouth daily.   methocarbamol 500 MG tablet Commonly known as:   ROBAXIN Take 1 tablet (500 mg total) by mouth every 6 (six) hours as needed for muscle spasms.   nortriptyline 10 MG capsule Commonly known as:  PAMELOR Take 1 capsule by mouth at bedtime as needed for sleep.   oxybutynin 5 MG 24 hr tablet Commonly known as:  DITROPAN-XL Take 5 mg by mouth daily as needed (bladder spasms).   Oxycodone HCl 10 MG Tabs Take 10 mg by mouth 2 (two) times daily. What changed:  Another medication with the same name was added. Make sure you understand how and when to take each.   Oxycodone HCl 10 MG Tabs Take 1 tablet (10 mg total) by mouth every 4 (four) hours as needed for severe pain. What changed:  You were already taking a medication with the same name, and this prescription was added. Make sure you understand how and when to take each.   pantoprazole 40 MG tablet Commonly known as:  PROTONIX Take 40 mg by mouth daily.   VISINE OP Apply 1 drop to eye daily as needed (irritation).        SignedEarleen Newport 11/15/2016, 9:14 AM

## 2016-11-22 IMAGING — US US CAROTID DUPLEX BILAT
1 series · 13 of 24 positions shown · non-contrast
Comparison: None

CLINICAL DATA: 53-year-old female with a history of amaurosis fugax
on the left.

Cardiovascular risk factors include hypertension, prior stroke/ TIA.
EXAM:
BILATERAL CAROTID DUPLEX ULTRASOUND
TECHNIQUE: Gray scale imaging, color Doppler and duplex ultrasound were
performed of bilateral carotid and vertebral arteries in the neck.

[Series 1: us carotid duplex bilat · 0.04mm/px · 13 of 68 slices shown]
[im 1/68]
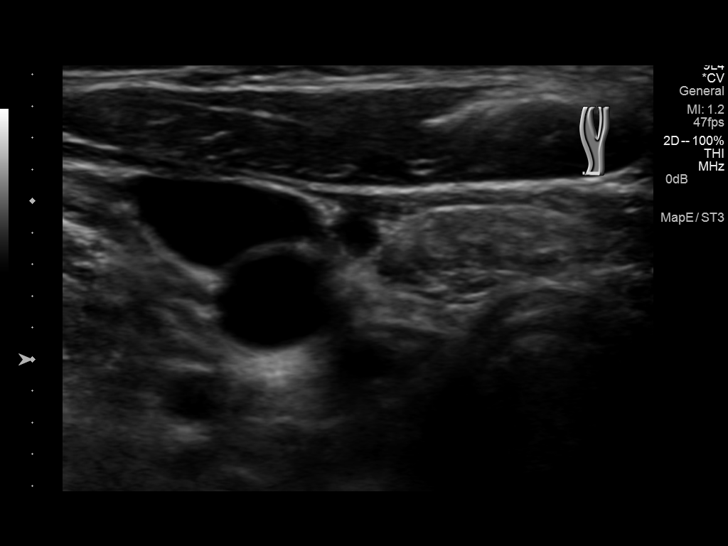
[im 6/68]
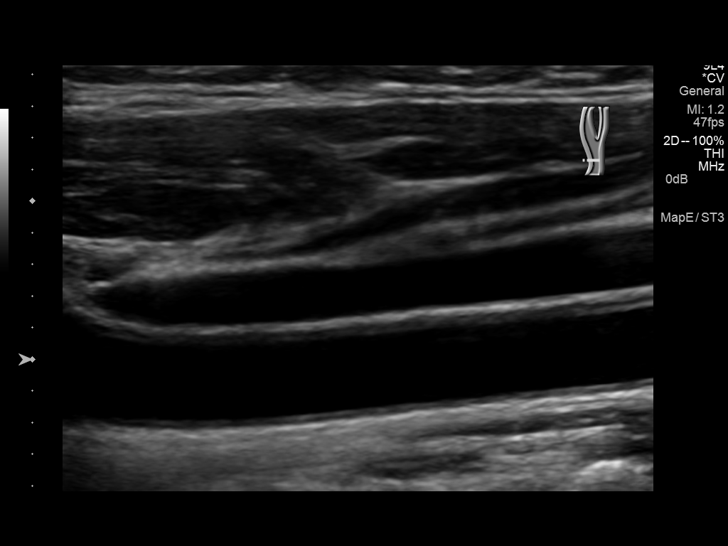
[im 12/68]
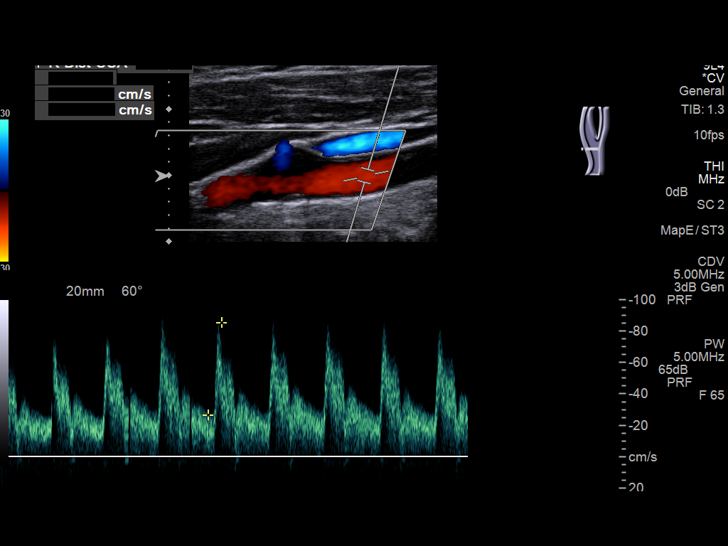
[im 18/68]
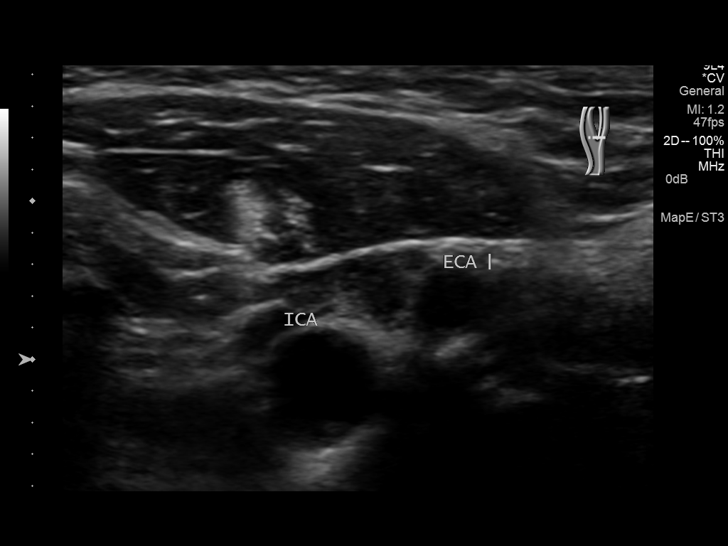
[im 24/68]
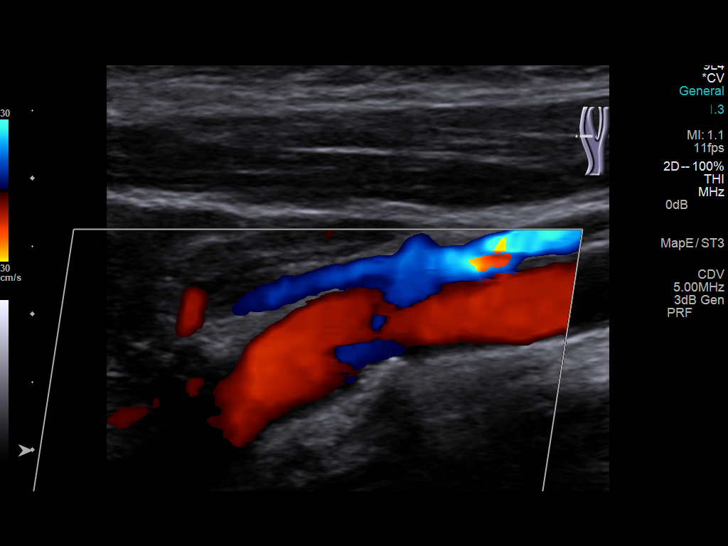
[im 30/68]
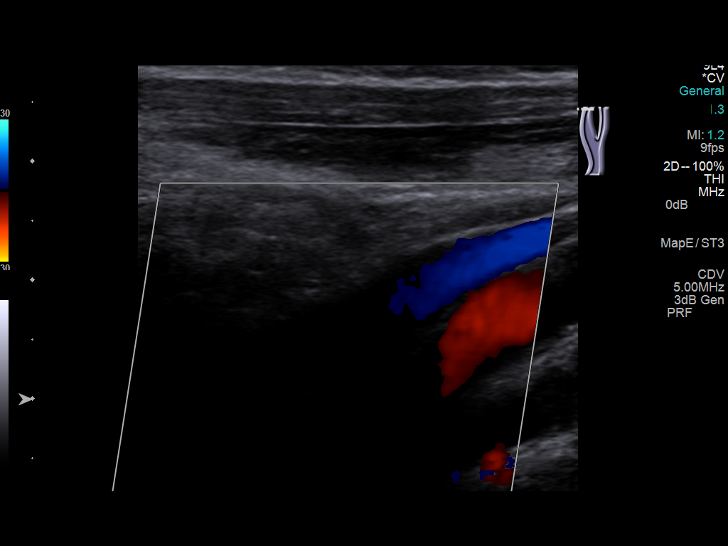
[im 35/68]
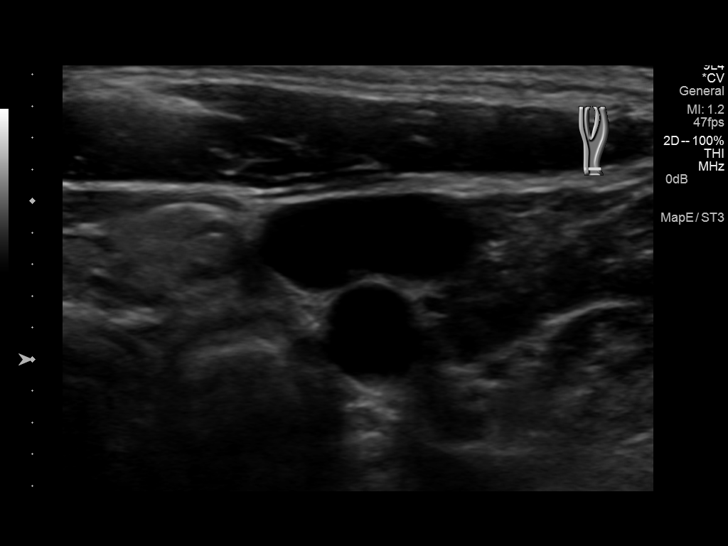
[im 38/68]
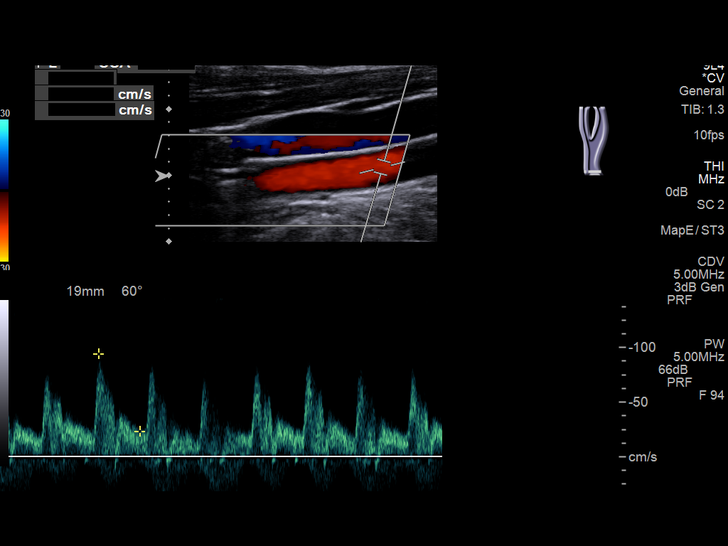
[im 44/68]
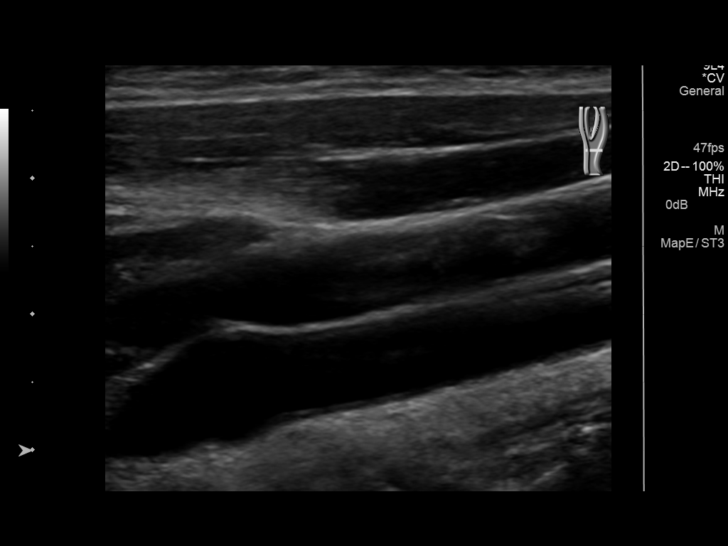
[im 50/68]
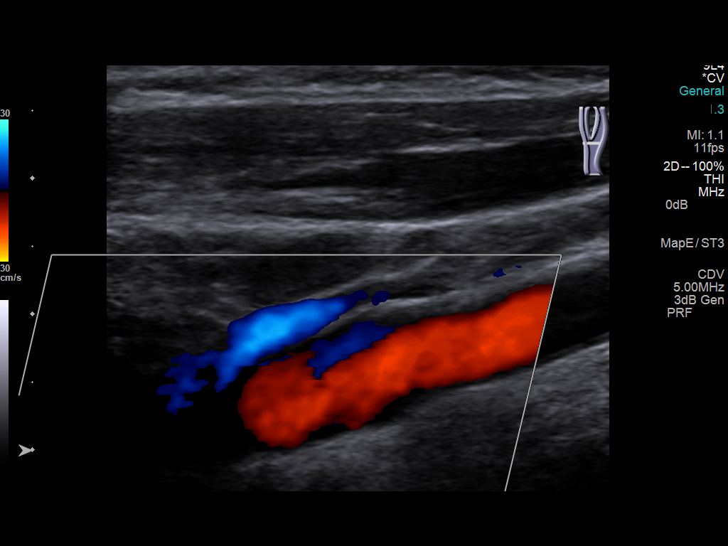
[im 56/68]
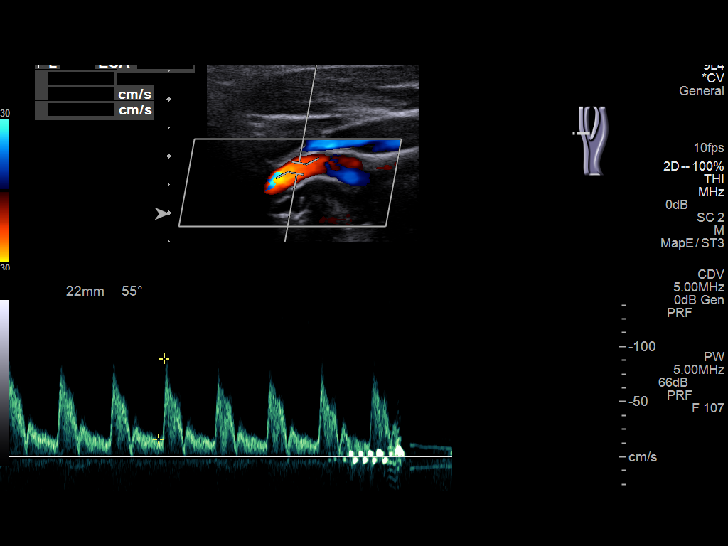
[im 62/68]
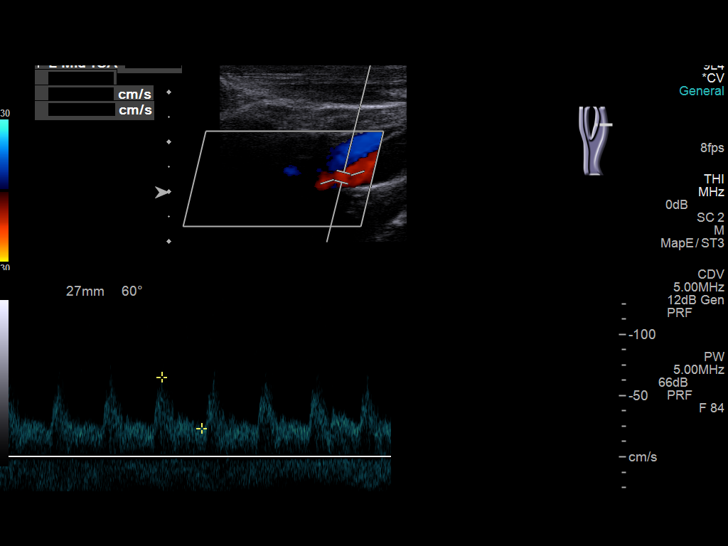
[im 68/68]
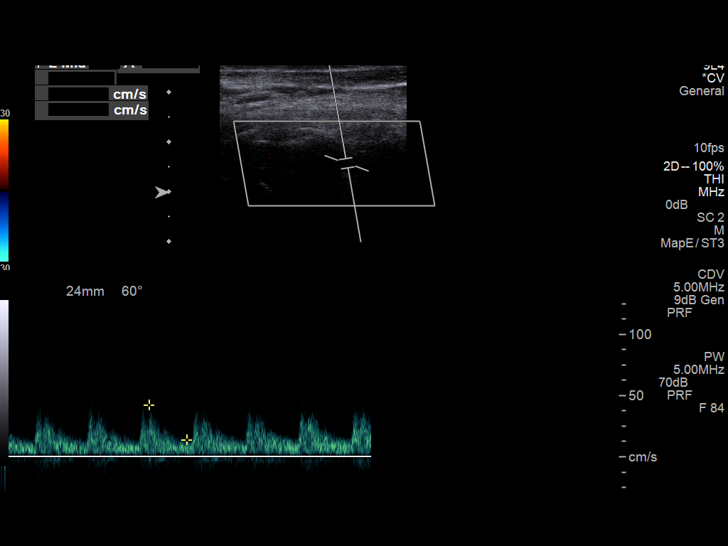

[13 of 24 positions shown; findings below may reference images not displayed]

FINDINGS: Criteria: Quantification of carotid stenosis is based on velocity
parameters that correlate the residual internal carotid diameter
with NASCET-based stenosis levels, using the diameter of the distal
internal carotid lumen as the denominator for stenosis measurement.

The following velocity measurements were obtained:

RIGHT

ICA:  Systolic 65 cm/sec, Diastolic 17 cm/sec

CCA:  85 cm/sec

SYSTOLIC ICA/CCA RATIO:

DIASTOLIC ICA/CCA RATIO:

ECA:  85 cm/sec

LEFT

ICA:  Systolic 88 cm/sec, Diastolic 37 cm/sec

CCA:  87 cm/sec

SYSTOLIC ICA/CCA RATIO:

DIASTOLIC ICA/CCA RATIO:

ECA:  89 cm/sec

RIGHT CAROTID ARTERY: Minimal heterogeneous plaque at the right
carotid bifurcation. Right CCA is unremarkable with intermediate
waveform.

Right ICA is patent with low resistance waveform. Tortuous right ICA
system.

RIGHT VERTEBRAL ARTERY: Antegrade flow with low resistance waveform.

LEFT CAROTID ARTERY:  Unremarkable left CCA.  Intermediate waveform.

Mild heterogeneous plaque at the left bifurcation. No
calcifications. Left ICA is patent with low resistance waveform.
Mild tortuosity.

LEFT VERTEBRAL ARTERY:  Antegrade flow with low resistance waveform.
IMPRESSION: Color duplex indicates minimal heterogeneous plaque, with no
hemodynamically significant stenosis by duplex criteria in the
extracranial cerebrovascular circulation.

## 2016-11-28 DIAGNOSIS — I10 Essential (primary) hypertension: Secondary | ICD-10-CM | POA: Diagnosis not present

## 2016-11-28 DIAGNOSIS — S129XXD Fracture of neck, unspecified, subsequent encounter: Secondary | ICD-10-CM | POA: Diagnosis not present

## 2016-11-28 DIAGNOSIS — Z6828 Body mass index (BMI) 28.0-28.9, adult: Secondary | ICD-10-CM | POA: Diagnosis not present

## 2016-11-28 DIAGNOSIS — M47812 Spondylosis without myelopathy or radiculopathy, cervical region: Secondary | ICD-10-CM | POA: Diagnosis not present

## 2016-12-22 IMAGING — CR DG CERVICAL SPINE 1V
1 series · 1 of 1 positions shown · non-contrast
Comparison: MRI dated 12/25/2013

CLINICAL DATA: Cervical spondylosis without myelopathy. Herniated
nucleus pulposis.

EXAM:
DG CERVICAL SPINE - 1 VIEW

[AP]
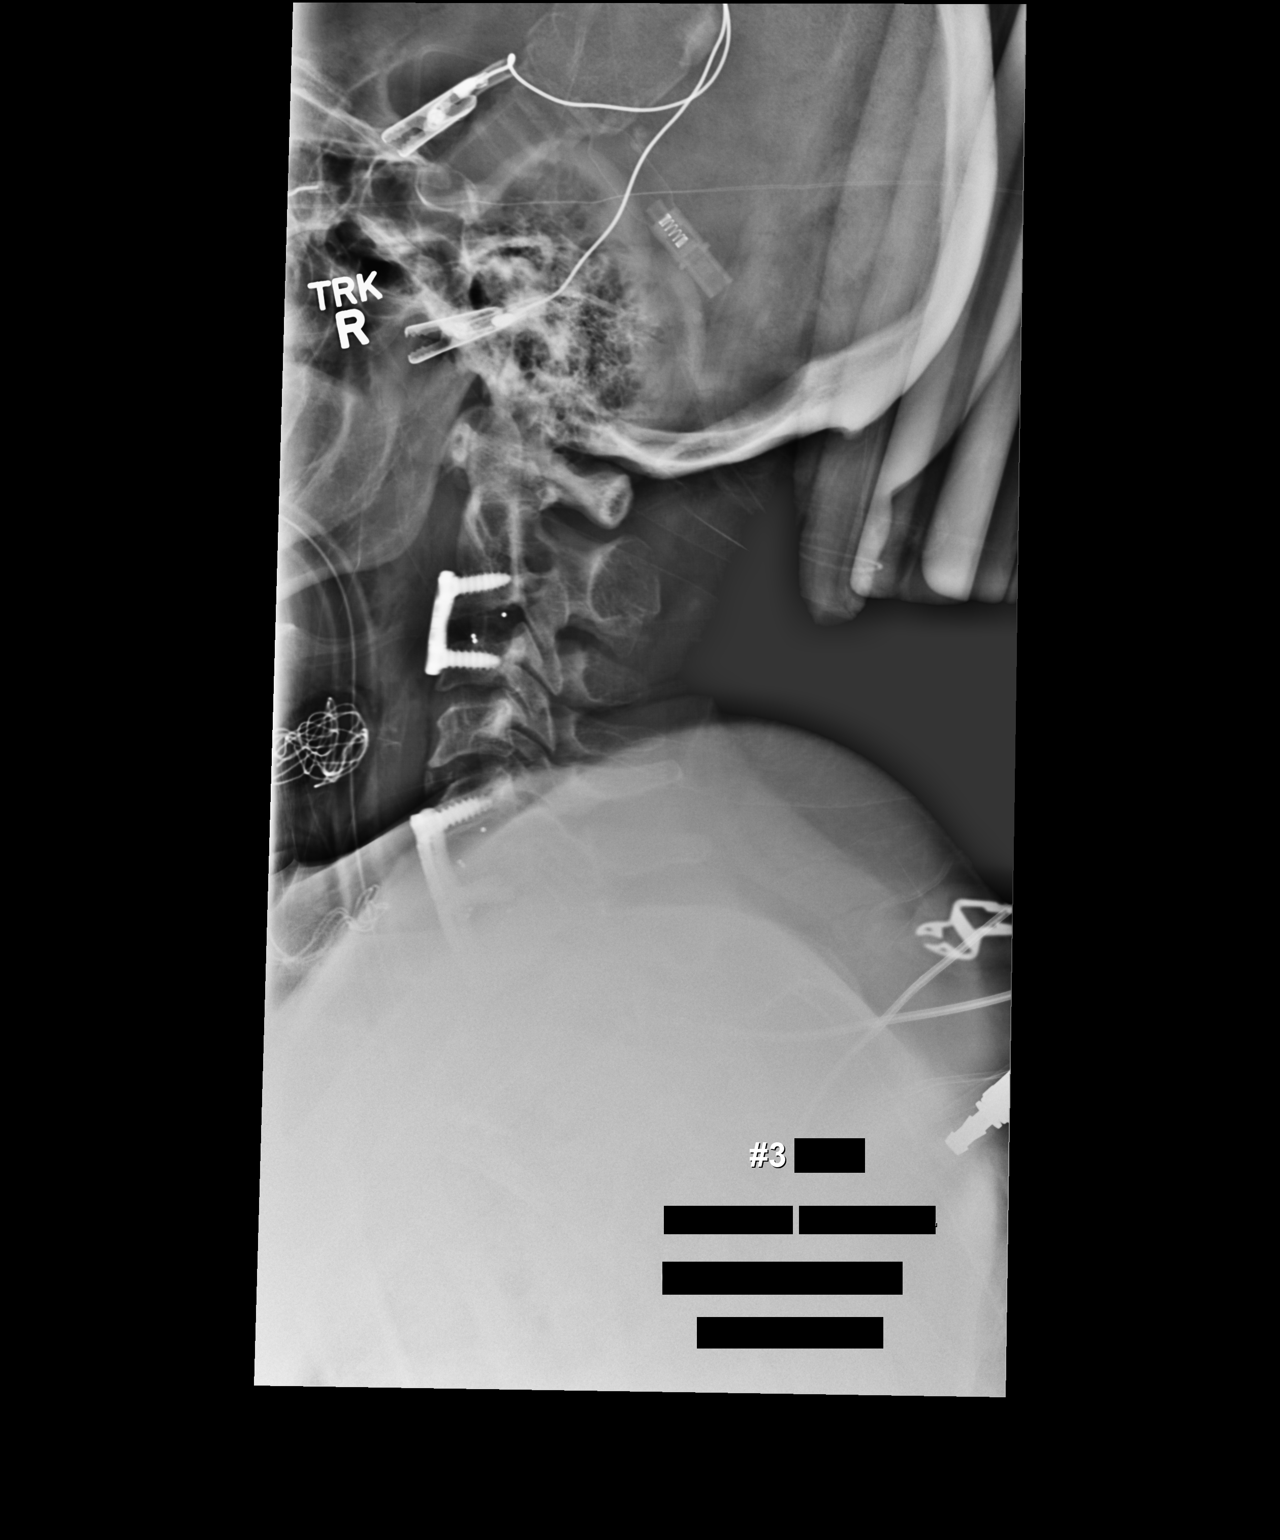

[1 of 1 positions shown; findings below may reference images not displayed]

FINDINGS: Single lateral portable view demonstrates the patient has undergone
anterior cervical fusion at C3-4 and C6-T1. Plates and interbody
fusion devices and screws are in place. Alignment is anatomic. This
C2 and C3 vertebra are congenitally fused.
IMPRESSION: Anterior cervical fusions at C3-4 and C6-T1.

## 2016-12-22 IMAGING — CR DG CERVICAL SPINE 1V
1 series · 1 of 1 positions shown · non-contrast
Comparison: CT cervical myelogram 02/17/2014.

CLINICAL DATA: 53-year-old female undergoing cervical spine
surgery. Initial encounter.

EXAM:
DG CERVICAL SPINE - 1 VIEW

[AP]
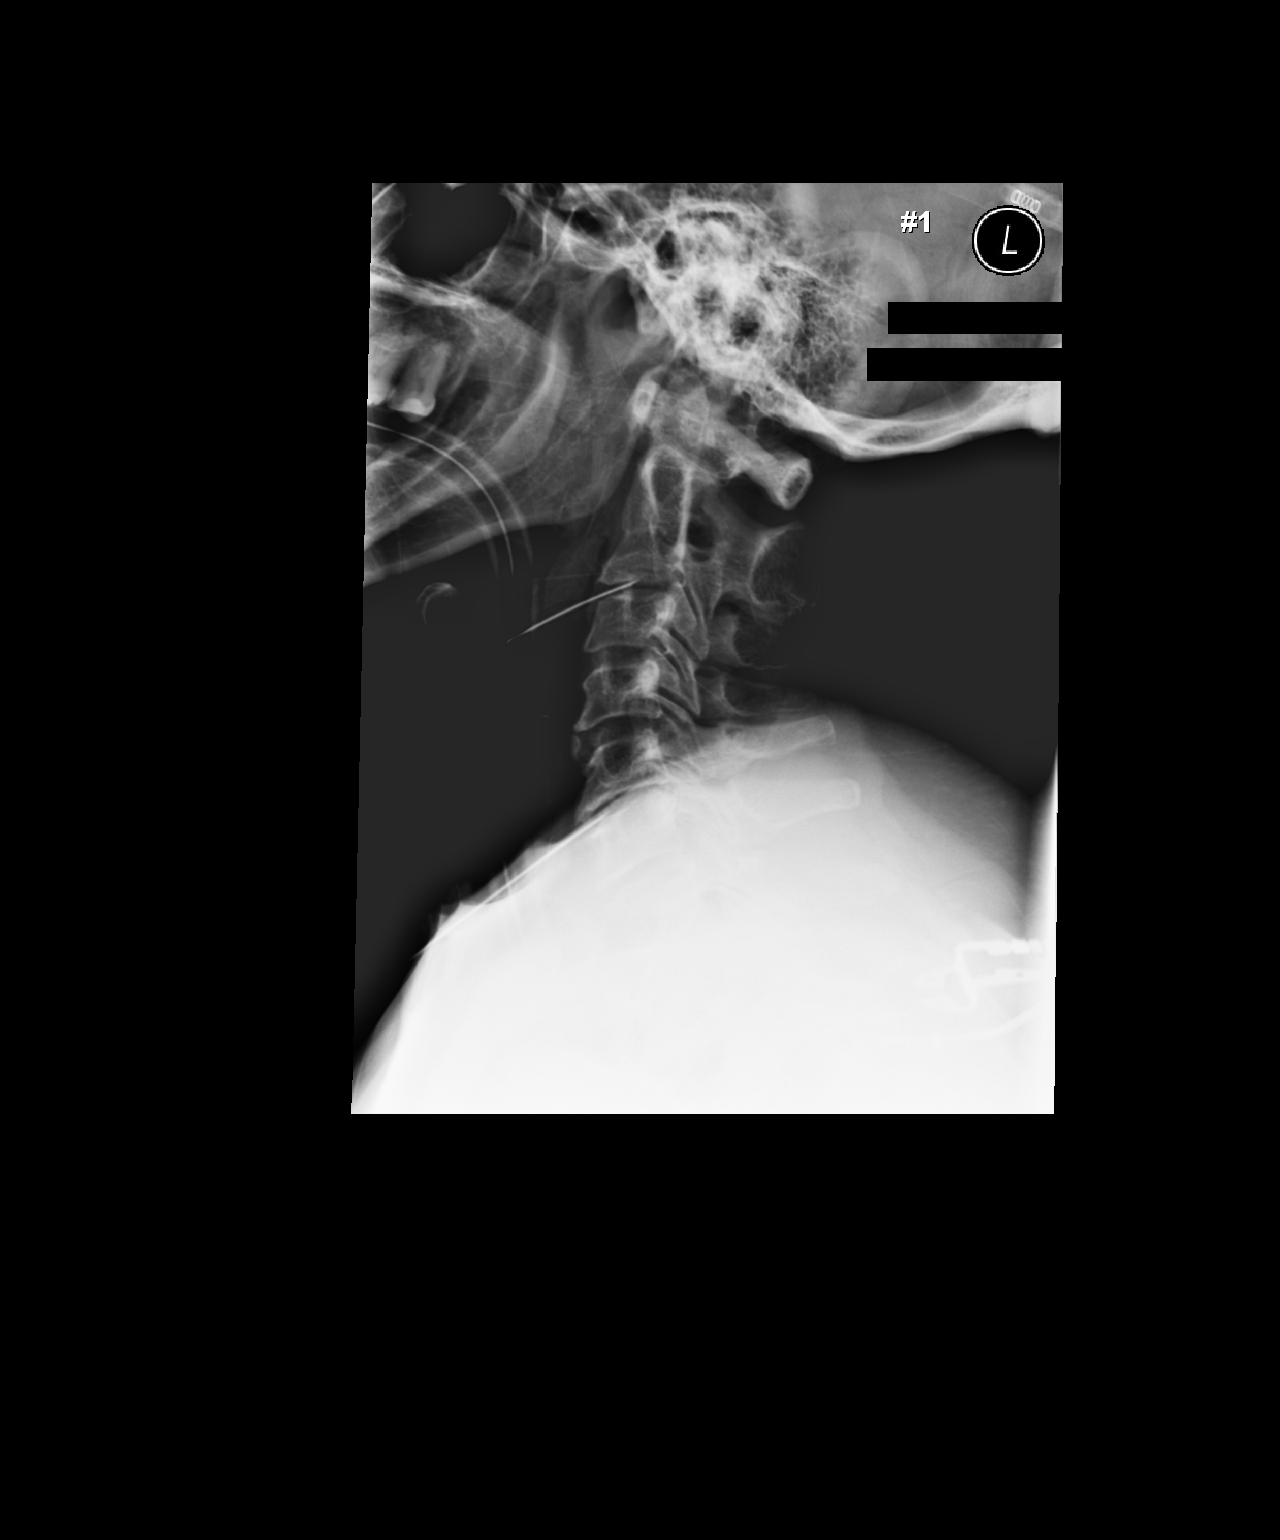

[1 of 1 positions shown; findings below may reference images not displayed]

FINDINGS: Same numbering system as on 02/17/2014, designating congenital non
segmentation of C2-C3.

Intraoperative portable cross-table lateral view of the cervical
spine labeled #1 at 0443 hrs.

Two needles are in place. The cephalad needle it is directed at the
C3-C4 disc space.

The caudal needle is directed at C6-C7.
IMPRESSION: Intraoperative localization as above. Study discussed by telephone
with Dr. LENE PAM on 08/06/2014 at [DATE] .

## 2017-01-02 DIAGNOSIS — S129XXD Fracture of neck, unspecified, subsequent encounter: Secondary | ICD-10-CM | POA: Diagnosis not present

## 2017-01-02 DIAGNOSIS — M47812 Spondylosis without myelopathy or radiculopathy, cervical region: Secondary | ICD-10-CM | POA: Diagnosis not present

## 2017-01-03 ENCOUNTER — Other Ambulatory Visit: Payer: Self-pay | Admitting: Neurological Surgery

## 2017-01-03 DIAGNOSIS — M47812 Spondylosis without myelopathy or radiculopathy, cervical region: Secondary | ICD-10-CM

## 2017-01-07 ENCOUNTER — Ambulatory Visit
Admission: RE | Admit: 2017-01-07 | Discharge: 2017-01-07 | Disposition: A | Discharge status: Home / Self Care | Payer: Medicare Other | Source: Ambulatory Visit | Attending: Neurological Surgery | Admitting: Neurological Surgery

## 2017-01-07 DIAGNOSIS — M4322 Fusion of spine, cervical region: Secondary | ICD-10-CM | POA: Diagnosis not present

## 2017-01-07 DIAGNOSIS — M47812 Spondylosis without myelopathy or radiculopathy, cervical region: Secondary | ICD-10-CM

## 2017-01-09 ENCOUNTER — Ambulatory Visit (HOSPITAL_COMMUNITY): Payer: Medicare Other | Attending: Neurological Surgery | Admitting: Physical Therapy

## 2017-01-09 DIAGNOSIS — M542 Cervicalgia: Secondary | ICD-10-CM | POA: Diagnosis not present

## 2017-01-09 DIAGNOSIS — R252 Cramp and spasm: Secondary | ICD-10-CM | POA: Insufficient documentation

## 2017-01-09 DIAGNOSIS — M25611 Stiffness of right shoulder, not elsewhere classified: Secondary | ICD-10-CM | POA: Diagnosis not present

## 2017-01-09 DIAGNOSIS — R293 Abnormal posture: Secondary | ICD-10-CM | POA: Insufficient documentation

## 2017-01-09 DIAGNOSIS — M25612 Stiffness of left shoulder, not elsewhere classified: Secondary | ICD-10-CM | POA: Diagnosis not present

## 2017-01-09 NOTE — Patient Instructions (Signed)
   LUMBAR ROLL  Use a small lumbar roll in chairs you sit at frequently. Place the roll at the lower curve of your back. This will help you maintain better posture.  Use this to help support your posture while you are driving and sitting throughout your day.   Also use this during your exercises.

## 2017-01-09 NOTE — Therapy (Signed)
[place], [place], [address] Phone: [phone]   Fax:  [phone]  Physical Therapy Evaluation  Patient Details  Name: Kimberly [name] MRN: [id number] Date of Birth: 1961-01-16 Referring Provider: Kristeen Miss   Encounter Date: 01/09/2017      PT End of Session - 01/09/17 1203    Visit Number 1   Number of Visits 17   Date for PT Re-Evaluation 02/06/17   Authorization Type Medicare primary; [employer] insurance secondary; Medicaid tertiary    Authorization Time Period 01/09/17 to 03/11/17   Authorization - Visit Number 1   Authorization - Number of Visits 10   PT Start Time 1113   PT Stop Time 1157   PT Time Calculation (min) 44 min   Activity Tolerance Patient tolerated treatment well   Behavior During Therapy [facility] for tasks assessed/performed      Past Medical History:  Diagnosis Date  . Anxiety    takes alprazolam - rare use   . Arthritis    cervical spondylosis   . Asthma    SEES  PULM W/ [place]    . Balance problem   . Brain cyst   . Depression   . Difficult intubation    Small mouth opening, limited neck flexion, very anterior   . GERD (gastroesophageal reflux disease)    pt. reports that its better, no meds in use at this time- 2015  . Headache   . Herpes   . History of kidney stones   . History of pneumonia   . Hypertension    pt. doesn't see cardiologist, followed for HTN by Dr. Gerarda Fraction  . Inflammation of shoulder joint   . Memory difficulties   . Pneumothorax on right    following C4-6 ACDF 07/04/16  . PONV (postoperative nausea and vomiting)    WITH SURGERY 07/04/16 (NECK) RT LUNG COLLAPSED  . Recurrent falls   . Shortness of breath dyspnea    WITH EXERTION   . Urinary frequency     Past Surgical History:  Procedure Laterality Date  . ABDOMINAL HYSTERECTOMY    . ANTERIOR CERVICAL DECOMP/DISCECTOMY FUSION N/A 08/06/2014   Procedure: ANTERIOR CERVICAL  DECOMPRESSION/DISCECTOMY FUSION CERVICAL THREE-FOUR,CERVICAL SIX-SEVEN ,CERVICAL SEVEN-THORACIC ONE;  Surgeon: Floyce Stakes, MD;  Location: [place];  Service: Neurosurgery;  Laterality: N/A;  . ANTERIOR CERVICAL DECOMP/DISCECTOMY FUSION N/A 07/04/2016   Procedure: CERVICAL FOUR-FIVE, CERVICAL FIVE-SIX ANTERIOR CERVICAL DECOMPRESSION/DISCECTOMY/FUSION WITH REVISION OF CERVICAL THREE-FOUR PLATE;  Surgeon: Leeroy Cha, MD;  Location: [place];  Service: Neurosurgery;  Laterality: N/A;  . CARPAL TUNNEL RELEASE Left 02/16/2013   Procedure: CARPAL TUNNEL RELEASE;  Surgeon: Carole Civil, MD;  Location: [facility];  Service: Orthopedics;  Laterality: Left;  . CARPAL TUNNEL RELEASE Right 03/27/2013   Procedure: RIGHT CARPAL TUNNEL RELEASE;  Surgeon: Carole Civil, MD;  Location: [facility];  Service: Orthopedics;  Laterality: Right;  . CESAREAN SECTION     x2  . CHOLECYSTECTOMY N/A 06/17/2015   Procedure: LAPAROSCOPIC CHOLECYSTECTOMY;  Surgeon: Aviva Signs, MD;  Location: [facility];  Service: General;  Laterality: N/A;  . COLONOSCOPY    . FOREIGN BODY REMOVAL Left    knee-as child  . NASAL SINUS SURGERY N/A 04/13/2015   Procedure: nasal endoscopy with adenoid biopsy;  Surgeon: Ruby Cola, MD;  Location: [place];  Service: ENT;  Laterality: N/A;  . NECK SURGERY  2016  . POSTERIOR CERVICAL FUSION/FORAMINOTOMY N/A 11/13/2016   Procedure: CERVICAL TWO-CERVICAL SIX  POSTERIOR CERVICAL FUSION WITH LATERAL MASS FIXATION;  Surgeon: Kristeen Miss, MD;  Location: [place];  Service: Neurosurgery;  Laterality: N/A;  posterior approach  . ROTATOR CUFF REPAIR Left   . SHOULDER ACROMIOPLASTY Left 05/18/2015   Procedure: SHOULDER ACROMIOPLASTY;  Surgeon: Earlie Server, MD;  Location: [place];  Service: Orthopedics;  Laterality: Left;  . SHOULDER ARTHROSCOPY WITH DISTAL CLAVICLE RESECTION Left 05/18/2015   Procedure: LEFT SHOULDER ARTHROSCOPY WITH  DISTAL CLAVICLE RESECTION;  Surgeon: Earlie Server, MD;   Location: [place];  Service: Orthopedics;  Laterality: Left;    There were no vitals filed for this visit.       Subjective Assessment - 01/09/17 1114    Subjective Patient has her most recent surgery (C2-C6 posterior fusion with lateral mass fixation) in April 2018; in terms of this most recent surgery she is just not feeling better. She also has a history of 2 other cervical surgeries, also fusions, and both in 2017. She is very tight and stiff, she has a hard time with overhead activities and pull-over-her-head motion with dressing. Her pain is in the center of her spine where her surgery was. She does have some numbness and tingling going down her L UE down to her elbow. No difficulties picking items up or writing right now.    Pertinent History HTN, asthma, hx of carpal tunnel (B but resolved), history of multiple rotator cuff surgeries (resolved), history of impingement syndrome, history of cervical spinal stenosis, anxiety    How long can you sit comfortably? 5 minutes    How long can you stand comfortably? immediate discomfort    How long can you walk comfortably? uncomfortable but does not limit her    Patient Stated Goals reduce pain, just move better in general    Currently in Pain? Yes   Pain Score 8    Pain Location Neck   Pain Orientation Left;Posterior   Pain Descriptors / Indicators Numbness;Pressure;Sharp   Pain Type Chronic pain   Pain Radiating Towards L UE to elbow    Pain Onset More than a month ago   Pain Frequency Constant   Aggravating Factors  movement in general    Pain Relieving Factors laying down, muscle relaxers    Effect of Pain on Daily Activities severe limitation             [facility] PT Assessment - 01/09/17 0001      Assessment   Medical Diagnosis cervical spondylosis    Referring Provider Kristeen Miss    Onset Date/Surgical Date --  April 2018   Next MD Visit Dr. Ellene Route 02/06/17   Prior Therapy PT/OT for neck and shoulders in  the past      Precautions   Precautions Cervical   Precaution Comments don't lift more than 5#     Balance Screen   Has the patient fallen in the past 6 months No   How many times? 2 falls but over 6 months ago    Has the patient had a decrease in activity level because of a fear of falling?  Yes   Is the patient reluctant to leave their home because of a fear of falling?  No     Prior Function   Level of Independence Independent;Independent with basic ADLs;Independent with gait;Independent with transfers   Vocation On disability   Leisure hanging out with grandkids      Observation/Other Assessments   Observations empty can test R (-) in shoulder,  did provoke pain in R elbow    Focus on Therapeutic Outcomes (FOTO)  72% limited      AROM   Right Shoulder Flexion --  WFL    Right Shoulder ABduction --  [facility]    Right Shoulder Internal Rotation --  approximately L4   Right Shoulder External Rotation --  approximately T1   Left Shoulder Flexion --  WFL    Left Shoulder ABduction --  [facility]    Left Shoulder Internal Rotation --  approximately L4    Left Shoulder External Rotation --  approximately T1    Cervical Flexion 12   Cervical Extension 4   Cervical - Right Side Bend 14   Cervical - Left Side Bend 12   Cervical - Right Rotation 25  mild thoracic compensation    Cervical - Left Rotation 18   Thoracic Flexion severe limitation    Thoracic Extension severe limitation    Thoracic - Right Side Bend severe limitation    Thoracic - Left Side Bend mild limitation    Thoracic - Right Rotation severe limitation    Thoracic - Left Rotation mild limitation      Strength   Overall Strength Comments grip appears symmertrical per gross manual testing; opposition appears WNL, no coordination deficits noted    Right Shoulder Flexion 5/5   Right Shoulder ABduction 4-/5   Right Shoulder Internal Rotation 5/5   Right Shoulder External Rotation 4-/5   Left Shoulder Flexion 5/5    Left Shoulder ABduction 5/5   Left Shoulder Internal Rotation 5/5   Left Shoulder External Rotation 5/5   Right Elbow Flexion 5/5   Right Elbow Extension 3+/5   Left Elbow Flexion 5/5   Left Elbow Extension 4-/5     Palpation   Palpation comment severe muscle knotting and trigger points (approx 80-90% impaired) per palpation; throughout entire cervical/shoulder/thoracic complex. TTP  grossly.             Objective measurements completed on examination: See above findings.          [facility] [facility] Adult PT Treatment/Exercise - 01/09/17 0001      Exercises   Exercises Neck     Neck Exercises: Seated   Other Seated Exercise 3D cervical and thoracic excursions 1x10  HEP reveiw- cues for form and correct performance                 PT Education - 01/09/17 1200    Education provided Yes   Education Details prognosis, exam findings, general education regarding dry needling, reasons for reducing time in cervical collars/braces; HEP review, POC    Person(s) Educated Patient   Methods Explanation;Demonstration;Verbal cues;Handout   Comprehension Verbalized understanding;Returned demonstration;Need further instruction          PT Short Term Goals - 01/09/17 1208      PT SHORT TERM GOAL #1   Title Patient to improve cervical ROM by at least 20 degrees on all planes in order to reduce pain and improve QOL    Time 4   Period Weeks   Status New     PT SHORT TERM GOAL #2   Title Patient to demonstrate thoracic ROM as being WFL on all planes in order to reduce pain and improve QOL    Time 4   Period Weeks   Status New     PT SHORT TERM GOAL #3   Title Patient to improve functional B shoulder ER and IR by at least 3  vertebral levels in order to show improved mobility and to improve functional task performance    Time 4   Period Weeks   Status New     PT SHORT TERM GOAL #4   Title Patient to experience radicular symptoms no further than proximal 20% of her L humerus in  order to show general symptoms improvement    Time 4   Period Weeks   Status New     PT SHORT TERM GOAL #5   Title Patient to be compliant with HEP, to be updated PRN    Time 1   Period Weeks   Status New           PT Long Term Goals - 01/09/17 1210      PT LONG TERM GOAL #1   Title Patient to be able to maintain correct posture at least 70% of the time without cues or increased pain in order to improve general mechanics    Time 8   Period Weeks   Status New     PT LONG TERM GOAL #2   Title Patient to demonstrate cervical ROM as being no more than 20 degrees of normal on all planes in order to reduce pain and improve functional task tolerance    Time 8   Period Weeks   Status New     PT LONG TERM GOAL #3   Title Patient to experience cervical pain no more than 2/10 with all functional tasks and activities in order to improve QOL and functional task tolerance    Time 8   Period Weeks   Status New     PT LONG TERM GOAL #4   Title Patient to be able to complete all functional self-care activiteis, including washing her hair and dressing, with no difficulty and pain no more than 2/10 in order to improve functional task tolerance    Time 8   Period Weeks   Status New     PT LONG TERM GOAL #5   Title Patient to experience no radicular symptoms L UE in order to show general improvement of condition    Time 8   Period Weeks   Status New                Plan - 01/09/17 1205    Clinical Impression Statement Patient arrives approximately 2 months after her third cervical fusion surgery; she reports that her pain has just not improved and she is in quite a bit of discomfort most of the time. Examination reveals severe cervical/thoracic/shoulder stiffness, postural impairment, functional weakness, severe muscle spasm and knotting, and reduced ability to perform functional tasks at this time. Patient will benefit from skilled PT services to attempt to address functional  deficits, reduce pain, and assist in improving ease of ADL performance.    History and Personal Factors relevant to plan of care: extensive PMH and surgical history on cervical spine and shoulders    Clinical Presentation Stable   Clinical Presentation due to: multiple surgeries, general sedentary lifestyle and immobility    Clinical Decision Making Low   Rehab Potential Fair   Clinical Impairments Affecting Rehab Potential (+) motivated to participate with PT; (-) medical and surgical history, chronicity of pain    PT Frequency 2x / week   PT Duration 8 weeks   PT Treatment/Interventions ADLs/Self Care Home Management;Biofeedback;Cryotherapy;Electrical Stimulation;Moist Heat;Ultrasound;Functional mobility training;Therapeutic activities;Therapeutic exercise;Balance training;Neuromuscular re-education;Patient/family education;Manual techniques;Scar mobilization;Passive range of motion;Dry needling;Taping   PT Next Visit Plan review  initial eval, HEP/goals; need to have Clarise Cruz discuss dry needling with patient to determine interest in this. Cervical ROM, thoracic ROM, extensive manual. Postural strength.    PT Home Exercise Plan Eval: lumbar roll for posture, cervical and thoracic excursions    Consulted and Agree with Plan of Care Patient      Patient will benefit from skilled therapeutic intervention in order to improve the following deficits and impairments:  Increased fascial restricitons, Improper body mechanics, Pain, Increased muscle spasms, Postural dysfunction, Decreased range of motion, Decreased strength, Hypomobility, Impaired UE functional use, Impaired flexibility  Visit Diagnosis: Cervicalgia - Plan: PT plan of care cert/re-cert  Abnormal posture - Plan: PT plan of care cert/re-cert  Stiffness of right shoulder, not elsewhere classified - Plan: PT plan of care cert/re-cert  Stiffness of left shoulder, not elsewhere classified - Plan: PT plan of care cert/re-cert  Cramp and  spasm - Plan: PT plan of care cert/re-cert      G-Codes - 19/75/88 1215    Functional Assessment Tool Used (Outpatient Only) Based on skilled clincial assessment of ROM, muscle spasm, posture, strength, functional task tolerance    Functional Limitation Mobility: Walking and moving around   Mobility: Walking and Moving Around Current Status ([id number]) At least 60 percent but less than 80 percent impaired, limited or restricted   Mobility: Walking and Moving Around Goal Status [phone]) At least 40 percent but less than 60 percent impaired, limited or restricted       Problem List Patient Active Problem List   Diagnosis Date Noted  . Pseudoarthrosis of cervical spine ([place]) 11/13/2016  . Asthma 07/05/2016  . Acute chest wall pain 07/05/2016  . GERD (gastroesophageal reflux disease) 07/05/2016  . Spontaneous pneumothorax   . Cervical stenosis of spinal canal 08/06/2014  . Muscle weakness (generalized) 11/12/2013  . Tight fascia 11/12/2013  . Decreased range of motion of shoulder 11/12/2013  . Cervical spondylosis without myelopathy 10/29/2013  . S/P carpal tunnel release 02/19/2013  . CTS (carpal tunnel syndrome) 02/19/2013  . SHOULDER PAIN 10/06/2007  . IMPINGEMENT SYNDROME 10/06/2007  . RUPTURE ROTATOR CUFF 10/06/2007  . HTN (hypertension) 10/03/2007   Deniece Ree PT, DPT [place] [address] [place], [place], [address] Phone: [phone]   Fax:  [phone]  Name: ERIEL DUNCKEL MRN: [id number] Date of Birth: [birth year]

## 2017-01-16 ENCOUNTER — Ambulatory Visit (HOSPITAL_COMMUNITY): Payer: Medicare Other | Admitting: Physical Therapy

## 2017-01-16 DIAGNOSIS — M542 Cervicalgia: Secondary | ICD-10-CM

## 2017-01-16 DIAGNOSIS — R293 Abnormal posture: Secondary | ICD-10-CM | POA: Diagnosis not present

## 2017-01-16 DIAGNOSIS — M25612 Stiffness of left shoulder, not elsewhere classified: Secondary | ICD-10-CM | POA: Diagnosis not present

## 2017-01-16 DIAGNOSIS — R252 Cramp and spasm: Secondary | ICD-10-CM | POA: Diagnosis not present

## 2017-01-16 DIAGNOSIS — M25611 Stiffness of right shoulder, not elsewhere classified: Secondary | ICD-10-CM | POA: Diagnosis not present

## 2017-01-16 NOTE — Therapy (Signed)
[place], [place], [address] Phone: [phone]   Fax:  [phone]  Physical Therapy Treatment  Patient Details  Name: Kimberly [name] MRN: [id number] Date of Birth: 1961/04/01 Referring Provider: Kristeen Miss   Encounter Date: 01/16/2017      PT End of Session - 01/16/17 1155    Visit Number 2   Number of Visits 17   Date for PT Re-Evaluation 02/06/17   Authorization Type Medicare primary; [employer] insurance secondary; Medicaid tertiary    Authorization Time Period 01/09/17 to 03/11/17   Authorization - Visit Number 2   Authorization - Number of Visits 10   PT Start Time 1115   PT Stop Time 1153   PT Time Calculation (min) 38 min   Activity Tolerance Patient tolerated treatment well   Behavior During Therapy [facility] for tasks assessed/performed      Past Medical History:  Diagnosis Date  . Anxiety    takes alprazolam - rare use   . Arthritis    cervical spondylosis   . Asthma    SEES  PULM W/ [place]    . Balance problem   . Brain cyst   . Depression   . Difficult intubation    Small mouth opening, limited neck flexion, very anterior   . GERD (gastroesophageal reflux disease)    pt. reports that its better, no meds in use at this time- 2015  . Headache   . Herpes   . History of kidney stones   . History of pneumonia   . Hypertension    pt. doesn't see cardiologist, followed for HTN by Dr. Gerarda Fraction  . Inflammation of shoulder joint   . Memory difficulties   . Pneumothorax on right    following C4-6 ACDF 07/04/16  . PONV (postoperative nausea and vomiting)    WITH SURGERY 07/04/16 (NECK) RT LUNG COLLAPSED  . Recurrent falls   . Shortness of breath dyspnea    WITH EXERTION   . Urinary frequency     Past Surgical History:  Procedure Laterality Date  . ABDOMINAL HYSTERECTOMY    . ANTERIOR CERVICAL DECOMP/DISCECTOMY FUSION N/A 08/06/2014   Procedure: ANTERIOR CERVICAL  DECOMPRESSION/DISCECTOMY FUSION CERVICAL THREE-FOUR,CERVICAL SIX-SEVEN ,CERVICAL SEVEN-THORACIC ONE;  Surgeon: Floyce Stakes, MD;  Location: [place];  Service: Neurosurgery;  Laterality: N/A;  . ANTERIOR CERVICAL DECOMP/DISCECTOMY FUSION N/A 07/04/2016   Procedure: CERVICAL FOUR-FIVE, CERVICAL FIVE-SIX ANTERIOR CERVICAL DECOMPRESSION/DISCECTOMY/FUSION WITH REVISION OF CERVICAL THREE-FOUR PLATE;  Surgeon: Leeroy Cha, MD;  Location: [place];  Service: Neurosurgery;  Laterality: N/A;  . CARPAL TUNNEL RELEASE Left 02/16/2013   Procedure: CARPAL TUNNEL RELEASE;  Surgeon: Carole Civil, MD;  [facility];  Service: Orthopedics;  Laterality: Left;  . CARPAL TUNNEL RELEASE Right 03/27/2013   Procedure: RIGHT CARPAL TUNNEL RELEASE;  Surgeon: Carole Civil, MD;  Location[facility];  Service: Orthopedics;  Laterality: Right;  . CESAREAN SECTION     x2  . CHOLECYSTECTOMY N/A 06/17/2015   Procedure: LAPAROSCOPIC CHOLECYSTECTOMY;  Surgeon: Aviva Signs, MD;  Location: [facility];  Service: General;  Laterality: N/A;  . COLONOSCOPY    . FOREIGN BODY REMOVAL Left    knee-as child  . NASAL SINUS SURGERY N/A 04/13/2015   Procedure: nasal endoscopy with adenoid biopsy;  Surgeon: Ruby Cola, MD;  Location: [place];  Service: ENT;  Laterality: N/A;  . NECK SURGERY  2016  . POSTERIOR CERVICAL FUSION/FORAMINOTOMY N/A 11/13/2016   Procedure: CERVICAL TWO-CERVICAL SIX  POSTERIOR CERVICAL FUSION WITH LATERAL MASS FIXATION;  Surgeon: Kristeen Miss, MD;  Location: [place];  Service: Neurosurgery;  Laterality: N/A;  posterior approach  . ROTATOR CUFF REPAIR Left   . SHOULDER ACROMIOPLASTY Left 05/18/2015   Procedure: SHOULDER ACROMIOPLASTY;  Surgeon: Earlie Server, MD;  Location: [place];  Service: Orthopedics;  Laterality: Left;  . SHOULDER ARTHROSCOPY WITH DISTAL CLAVICLE RESECTION Left 05/18/2015   Procedure: LEFT SHOULDER ARTHROSCOPY WITH  DISTAL CLAVICLE RESECTION;  Surgeon: Earlie Server, MD;   Location: [place];  Service: Orthopedics;  Laterality: Left;    There were no vitals filed for this visit.      Subjective Assessment - 01/16/17 1118    Subjective Pt states she's been doing her exercises at home.  Currently with 6/10 pain, which is better than her last visit when pain was 8/10.   Pertinent History C2-C6 posterior fusion 10/2016, HTN, asthma, hx of carpal tunnel (B but resolved), history of multiple rotator cuff surgeries (resolved), history of impingement syndrome, history of cervical spinal stenosis, anxiety    Currently in Pain? Yes   Pain Score 6    Pain Location Neck   Pain Orientation Left;Posterior;Right   Pain Descriptors / Indicators Pressure   Pain Type Chronic pain   Pain Radiating Towards to Lt elbow   Pain Onset More than a month ago   Pain Frequency Constant                         [facility] Adult PT Treatment/Exercise - 01/16/17 0001      Neck Exercises: Seated   Shoulder Shrugs Limitations UE retraction seated 10 reps   Shoulder Rolls 10 reps   Other Seated Exercise 3D cervical and thoracic excursions 1x10   Other Seated Exercise thoracic excursions 10 reps each     Neck Exercises: Supine   Cervical Isometrics Right lateral flexion;Left lateral flexion;3 secs;5 reps     Manual Therapy   Manual Therapy Soft tissue mobilization   Manual therapy comments Manual therapy completed seperately to all other interventions this date   Soft tissue mobilization to bilateral cervical seated with lumbar support                PT Education - 01/16/17 1155    Education provided Yes   Education Details reviewed HEP and goals per intial evaluation.    Person(s) Educated Patient   Methods Explanation;Demonstration;Tactile cues;Verbal cues;Handout   Comprehension Verbalized understanding;Returned demonstration;Verbal cues required;Tactile cues required;Need further instruction          PT Short Term Goals - 01/09/17  1208      PT SHORT TERM GOAL #1   Title Patient to improve cervical ROM by at least 20 degrees on all planes in order to reduce pain and improve QOL    Time 4   Period Weeks   Status New     PT SHORT TERM GOAL #2   Title Patient to demonstrate thoracic ROM as being WFL on all planes in order to reduce pain and improve QOL    Time 4   Period Weeks   Status New     PT SHORT TERM GOAL #3   Title Patient to improve functional B shoulder ER and IR by at least 3 vertebral levels in order to show improved mobility and to improve functional task performance    Time 4   Period Weeks   Status New  PT SHORT TERM GOAL #4   Title Patient to experience radicular symptoms no further than proximal 20% of her L humerus in order to show general symptoms improvement    Time 4   Period Weeks   Status New     PT SHORT TERM GOAL #5   Title Patient to be compliant with HEP, to be updated PRN    Time 1   Period Weeks   Status New           PT Long Term Goals - 01/09/17 1210      PT LONG TERM GOAL #1   Title Patient to be able to maintain correct posture at least 70% of the time without cues or increased pain in order to improve general mechanics    Time 8   Period Weeks   Status New     PT LONG TERM GOAL #2   Title Patient to demonstrate cervical ROM as being no more than 20 degrees of normal on all planes in order to reduce pain and improve functional task tolerance    Time 8   Period Weeks   Status New     PT LONG TERM GOAL #3   Title Patient to experience cervical pain no more than 2/10 with all functional tasks and activities in order to improve QOL and functional task tolerance    Time 8   Period Weeks   Status New     PT LONG TERM GOAL #4   Title Patient to be able to complete all functional self-care activiteis, including washing her hair and dressing, with no difficulty and pain no more than 2/10 in order to improve functional task tolerance    Time 8   Period Weeks    Status New     PT LONG TERM GOAL #5   Title Patient to experience no radicular symptoms L UE in order to show general improvement of condition    Time 8   Period Weeks   Status New               Plan - 01/16/17 1156    Clinical Impression Statement reviewed HEP and goals per intial evaluation.  Educated on importance of seperating head and upper body movements as pateint is very tense and stiff.  Initiated soft tissue mobilization with extreme tightness noted throoughout cervical musculauture.  Pt very pleased after manual with reduction of pain to 1/10 and improved ROM in neck    Rehab Potential Fair   Clinical Impairments Affecting Rehab Potential (+) motivated to participate with PT; (-) medical and surgical history, chronicity of pain    PT Frequency 2x / week   PT Duration 8 weeks   PT Treatment/Interventions ADLs/Self Care Home Management;Biofeedback;Cryotherapy;Electrical Stimulation;Moist Heat;Ultrasound;Functional mobility training;Therapeutic activities;Therapeutic exercise;Balance training;Neuromuscular re-education;Patient/family education;Manual techniques;Scar mobilization;Passive range of motion;Dry needling;Taping   PT Next Visit Plan Continued focus on cervical and thoracic ROM,extensive manual. Postural strength.    PT Home Exercise Plan Eval: lumbar roll for posture, cervical and thoracic excursions    Consulted and Agree with Plan of Care Patient      Patient will benefit from skilled therapeutic intervention in order to improve the following deficits and impairments:  Increased fascial restricitons, Improper body mechanics, Pain, Increased muscle spasms, Postural dysfunction, Decreased range of motion, Decreased strength, Hypomobility, Impaired UE functional use, Impaired flexibility  Visit Diagnosis: Cervicalgia  Abnormal posture     Problem List Patient Active Problem List   Diagnosis Date Noted  .  Pseudoarthrosis of cervical spine ([place]) 11/13/2016   . Asthma 07/05/2016  . Acute chest wall pain 07/05/2016  . GERD (gastroesophageal reflux disease) 07/05/2016  . Spontaneous pneumothorax   . Cervical stenosis of spinal canal 08/06/2014  . Muscle weakness (generalized) 11/12/2013  . Tight fascia 11/12/2013  . Decreased range of motion of shoulder 11/12/2013  . Cervical spondylosis without myelopathy 10/29/2013  . S/P carpal tunnel release 02/19/2013  . CTS (carpal tunnel syndrome) 02/19/2013  . SHOULDER PAIN 10/06/2007  . IMPINGEMENT SYNDROME 10/06/2007  . RUPTURE ROTATOR CUFF 10/06/2007  . HTN (hypertension) 10/03/2007    Teena Irani, PTA/CLT [phone]  01/16/2017, 12:01 PM  [place] [address] [place], [place], [address] Phone: [phone]   Fax:  [phone]  Name: Kimberly [name] MRN: [id number] Date of Birth: [birth year]

## 2017-01-22 ENCOUNTER — Encounter (HOSPITAL_COMMUNITY): Payer: Medicare Other

## 2017-01-23 ENCOUNTER — Ambulatory Visit (HOSPITAL_COMMUNITY): Payer: Medicare Other

## 2017-01-23 DIAGNOSIS — M25612 Stiffness of left shoulder, not elsewhere classified: Secondary | ICD-10-CM

## 2017-01-23 DIAGNOSIS — R252 Cramp and spasm: Secondary | ICD-10-CM

## 2017-01-23 DIAGNOSIS — R293 Abnormal posture: Secondary | ICD-10-CM

## 2017-01-23 DIAGNOSIS — M25611 Stiffness of right shoulder, not elsewhere classified: Secondary | ICD-10-CM | POA: Diagnosis not present

## 2017-01-23 DIAGNOSIS — M542 Cervicalgia: Secondary | ICD-10-CM

## 2017-01-23 NOTE — Therapy (Signed)
[place], [place], [address] Phone: [phone]   Fax:  [phone]  Physical Therapy Treatment  Patient Details  Name: Kimberly [name] MRN: [id number] Date of Birth: 01/05/1961 Referring Provider: Kristeen Miss   Encounter Date: 01/23/2017      PT End of Session - 01/23/17 1102    Visit Number 3   Number of Visits 17   Date for PT Re-Evaluation 02/06/17  MD apt 7/11   Authorization Type Medicare primary; [employer] insurance secondary; Medicaid tertiary    Authorization Time Period 01/09/17 to 03/11/17   Authorization - Visit Number 3   Authorization - Number of Visits 10   PT Start Time 1038   PT Stop Time 1116   PT Time Calculation (min) 38 min   Activity Tolerance Patient tolerated treatment well   Behavior During Therapy [facility] for tasks assessed/performed      Past Medical History:  Diagnosis Date  . Anxiety    takes alprazolam - rare use   . Arthritis    cervical spondylosis   . Asthma    SEES  PULM W/ [place]    . Balance problem   . Brain cyst   . Depression   . Difficult intubation    Small mouth opening, limited neck flexion, very anterior   . GERD (gastroesophageal reflux disease)    pt. reports that its better, no meds in use at this time- 2015  . Headache   . Herpes   . History of kidney stones   . History of pneumonia   . Hypertension    pt. doesn't see cardiologist, followed for HTN by Dr. Gerarda Fraction  . Inflammation of shoulder joint   . Memory difficulties   . Pneumothorax on right    following C4-6 ACDF 07/04/16  . PONV (postoperative nausea and vomiting)    WITH SURGERY 07/04/16 (NECK) RT LUNG COLLAPSED  . Recurrent falls   . Shortness of breath dyspnea    WITH EXERTION   . Urinary frequency     Past Surgical History:  Procedure Laterality Date  . ABDOMINAL HYSTERECTOMY    . ANTERIOR CERVICAL DECOMP/DISCECTOMY FUSION N/A 08/06/2014   Procedure: ANTERIOR CERVICAL  DECOMPRESSION/DISCECTOMY FUSION CERVICAL THREE-FOUR,CERVICAL SIX-SEVEN ,CERVICAL SEVEN-THORACIC ONE;  Surgeon: Floyce Stakes, MD;  Location: [place];  Service: Neurosurgery;  Laterality: N/A;  . ANTERIOR CERVICAL DECOMP/DISCECTOMY FUSION N/A 07/04/2016   Procedure: CERVICAL FOUR-FIVE, CERVICAL FIVE-SIX ANTERIOR CERVICAL DECOMPRESSION/DISCECTOMY/FUSION WITH REVISION OF CERVICAL THREE-FOUR PLATE;  Surgeon: Leeroy Cha, MD;  Location: [place];  Service: Neurosurgery;  Laterality: N/A;  . CARPAL TUNNEL RELEASE Left 02/16/2013   Procedure: CARPAL TUNNEL RELEASE;  Surgeon: Carole Civil, MD;  [facility];  Service: Orthopedics;  Laterality: Left;  . CARPAL TUNNEL RELEASE Right 03/27/2013   Procedure: RIGHT CARPAL TUNNEL RELEASE;  Surgeon: Carole Civil, MD;  [facility]: [facility];  Service: Orthopedics;  Laterality: Right;  . CESAREAN SECTION     x2  . CHOLECYSTECTOMY N/A 06/17/2015   Procedure: LAPAROSCOPIC CHOLECYSTECTOMY;  Surgeon: Aviva Signs, MD;  [facility]: [facility];  Service: General;  Laterality: N/A;  . COLONOSCOPY    . FOREIGN BODY REMOVAL Left    knee-as child  . NASAL SINUS SURGERY N/A 04/13/2015   Procedure: nasal endoscopy with adenoid biopsy;  Surgeon: Ruby Cola, MD;  Location: [place];  Service: ENT;  Laterality: N/A;  . NECK SURGERY  2016  . POSTERIOR CERVICAL FUSION/FORAMINOTOMY N/A 11/13/2016  Procedure: CERVICAL TWO-CERVICAL SIX POSTERIOR CERVICAL FUSION WITH LATERAL MASS FIXATION;  Surgeon: Kristeen Miss, MD;  Location: [place];  Service: Neurosurgery;  Laterality: N/A;  posterior approach  . ROTATOR CUFF REPAIR Left   . SHOULDER ACROMIOPLASTY Left 05/18/2015   Procedure: SHOULDER ACROMIOPLASTY;  Surgeon: Earlie Server, MD;  Location: [place];  Service: Orthopedics;  Laterality: Left;  . SHOULDER ARTHROSCOPY WITH DISTAL CLAVICLE RESECTION Left 05/18/2015   Procedure: LEFT SHOULDER ARTHROSCOPY WITH  DISTAL CLAVICLE RESECTION;  Surgeon: Earlie Server, MD;   Location: [place];  Service: Orthopedics;  Laterality: Left;    There were no vitals filed for this visit.      Subjective Assessment - 01/23/17 1038    Subjective Pt continues to have high pain scale posterior neck, pain scale 6/10.  Increased difficulty sleeping last night especially on her back.   Pertinent History C2-C6 posterior fusion 10/2016, HTN, asthma, hx of carpal tunnel (B but resolved), history of multiple rotator cuff surgeries (resolved), history of impingement syndrome, history of cervical spinal stenosis, anxiety    Patient Stated Goals reduce pain, just move better in general    Currently in Pain? Yes   Pain Score 6    Pain Location Neck   Pain Orientation Posterior   Pain Descriptors / Indicators Throbbing;Sharp   Pain Type Chronic pain   Pain Radiating Towards no radicular symptoms.   Pain Onset More than a month ago   Pain Frequency Constant   Aggravating Factors  movement in general   Pain Relieving Factors laying down, muscle relaxors                         [facility] Adult PT Treatment/Exercise - 01/23/17 0001      Neck Exercises: Seated   Neck Retraction 5 reps;3 secs   Shoulder Rolls Backwards;10 reps  shoulders up, back and down   Other Seated Exercise 3D cervical and thoracic excursions 1x10     Manual Therapy   Manual Therapy Soft tissue mobilization   Manual therapy comments Manual therapy completed seperately to all other interventions this date   Soft tissue mobilization in prone position MFR to incision; STM to cervical musculature                  PT Short Term Goals - 01/09/17 1208      PT SHORT TERM GOAL #1   Title Patient to improve cervical ROM by at least 20 degrees on all planes in order to reduce pain and improve QOL    Time 4   Period Weeks   Status New     PT SHORT TERM GOAL #2   Title Patient to demonstrate thoracic ROM as being WFL on all planes in order to reduce pain and improve QOL     Time 4   Period Weeks   Status New     PT SHORT TERM GOAL #3   Title Patient to improve functional B shoulder ER and IR by at least 3 vertebral levels in order to show improved mobility and to improve functional task performance    Time 4   Period Weeks   Status New     PT SHORT TERM GOAL #4   Title Patient to experience radicular symptoms no further than proximal 20% of her L humerus in order to show general symptoms improvement    Time 4   Period Weeks   Status New  PT SHORT TERM GOAL #5   Title Patient to be compliant with HEP, to be updated PRN    Time 1   Period Weeks   Status New           PT Long Term Goals - 01/09/17 1210      PT LONG TERM GOAL #1   Title Patient to be able to maintain correct posture at least 70% of the time without cues or increased pain in order to improve general mechanics    Time 8   Period Weeks   Status New     PT LONG TERM GOAL #2   Title Patient to demonstrate cervical ROM as being no more than 20 degrees of normal on all planes in order to reduce pain and improve functional task tolerance    Time 8   Period Weeks   Status New     PT LONG TERM GOAL #3   Title Patient to experience cervical pain no more than 2/10 with all functional tasks and activities in order to improve QOL and functional task tolerance    Time 8   Period Weeks   Status New     PT LONG TERM GOAL #4   Title Patient to be able to complete all functional self-care activiteis, including washing her hair and dressing, with no difficulty and pain no more than 2/10 in order to improve functional task tolerance    Time 8   Period Weeks   Status New     PT LONG TERM GOAL #5   Title Patient to experience no radicular symptoms L UE in order to show general improvement of condition    Time 8   Period Weeks   Status New               Plan - 01/23/17 1103    Clinical Impression Statement Pt arrived with high points of pain and very tense/stiff upper half  and shoulders elevated.  Pt educated on importance proper posture and to reduce stress by separating head and upper body to reduce tightness and pain.  Began session with manual technqiues to address tightness throughout cervical musculature, pt reports vast improvements with pain reduced to 1-2/10 and improved mobility.  Therex focus on improving cervical mobility with cervical and thoracic excursion.   Rehab Potential Fair   Clinical Impairments Affecting Rehab Potential (+) motivated to participate with PT; (-) medical and surgical history, chronicity of pain    PT Frequency 2x / week   PT Duration 8 weeks   PT Treatment/Interventions ADLs/Self Care Home Management;Biofeedback;Cryotherapy;Electrical Stimulation;Moist Heat;Ultrasound;Functional mobility training;Therapeutic activities;Therapeutic exercise;Balance training;Neuromuscular re-education;Patient/family education;Manual techniques;Scar mobilization;Passive range of motion;Dry needling;Taping   PT Next Visit Plan Continued focus on cervical and thoracic ROM,extensive manual. Postural strength.    PT Home Exercise Plan Eval: lumbar roll for posture, cervical and thoracic excursions       Patient will benefit from skilled therapeutic intervention in order to improve the following deficits and impairments:  Increased fascial restricitons, Improper body mechanics, Pain, Increased muscle spasms, Postural dysfunction, Decreased range of motion, Decreased strength, Hypomobility, Impaired UE functional use, Impaired flexibility  Visit Diagnosis: Cervicalgia  Abnormal posture  Stiffness of right shoulder, not elsewhere classified  Stiffness of left shoulder, not elsewhere classified  Cramp and spasm     Problem List Patient Active Problem List   Diagnosis Date Noted  . Pseudoarthrosis of cervical spine ([place]) 11/13/2016  . Asthma 07/05/2016  . Acute chest wall pain 07/05/2016  .  GERD (gastroesophageal reflux disease) 07/05/2016  .  Spontaneous pneumothorax   . Cervical stenosis of spinal canal 08/06/2014  . Muscle weakness (generalized) 11/12/2013  . Tight fascia 11/12/2013  . Decreased range of motion of shoulder 11/12/2013  . Cervical spondylosis without myelopathy 10/29/2013  . S/P carpal tunnel release 02/19/2013  . CTS (carpal tunnel syndrome) 02/19/2013  . SHOULDER PAIN 10/06/2007  . IMPINGEMENT SYNDROME 10/06/2007  . RUPTURE ROTATOR CUFF 10/06/2007  . HTN (hypertension) 10/03/2007   Ihor Austin, [place]; [place]  Aldona Lento 01/23/2017, 12:41 PM  [place], [place], [address] Phone: [phone]   Fax:  [phone]  Name: Kimberly [name] MRN: [id number] Date of Birth: 21-Mar-1961

## 2017-01-24 DIAGNOSIS — F325 Major depressive disorder, single episode, in full remission: Secondary | ICD-10-CM | POA: Diagnosis not present

## 2017-01-24 DIAGNOSIS — I10 Essential (primary) hypertension: Secondary | ICD-10-CM | POA: Diagnosis not present

## 2017-01-24 DIAGNOSIS — Z131 Encounter for screening for diabetes mellitus: Secondary | ICD-10-CM | POA: Diagnosis not present

## 2017-01-24 DIAGNOSIS — K219 Gastro-esophageal reflux disease without esophagitis: Secondary | ICD-10-CM | POA: Diagnosis not present

## 2017-01-25 ENCOUNTER — Ambulatory Visit (HOSPITAL_COMMUNITY): Payer: Medicare Other | Admitting: Physical Therapy

## 2017-01-25 DIAGNOSIS — R252 Cramp and spasm: Secondary | ICD-10-CM | POA: Diagnosis not present

## 2017-01-25 DIAGNOSIS — R293 Abnormal posture: Secondary | ICD-10-CM

## 2017-01-25 DIAGNOSIS — M25611 Stiffness of right shoulder, not elsewhere classified: Secondary | ICD-10-CM | POA: Diagnosis not present

## 2017-01-25 DIAGNOSIS — M25612 Stiffness of left shoulder, not elsewhere classified: Secondary | ICD-10-CM | POA: Diagnosis not present

## 2017-01-25 DIAGNOSIS — M542 Cervicalgia: Secondary | ICD-10-CM

## 2017-01-25 NOTE — Patient Instructions (Signed)
   UPPER TRAP STRETCH - HOLDING CHAIR  While sitting in a chair, hold the seat with one hand and bend your head towards the right for a gentle stretch to the left side of the neck.  Hold for 30 seconds.  Repeat 3 times each side, twice a day.    Levator Stretch- No Over Pressure  While seated, or standing, rest your hand in the small of your back, comfortably. Rotate your head away from that arm and down towards your chest/armpit until a stretch is felt. Let gravity do the work and do not force the stretch with your neck muscles.   Hold for 30 seconds.  Repeat 3 times each side, twice a day.    SCM Stretch  1. With head in neutral position, place both hands around the front of your throat  2. Drag down slack skin towards your collar bones  3. Slowly tilt head back and look up  4. You should feel a strong stretch at the front of your neck  5. Hold position for 30 seconds then return head to neutral position  6. Reset grip on skin and repeat 2 times, twice a day.

## 2017-01-25 NOTE — Therapy (Signed)
[place], [place], [address] Phone: [phone]   Fax:  [phone]  Physical Therapy Treatment  Patient Details  Name: Kimberly [name] MRN: [id number] Date of Birth: [birth year] Referring Provider: Kristeen Miss   Encounter Date: 01/25/2017      PT End of Session - 01/25/17 0943    Visit Number 4   Number of Visits 17   Date for PT Re-Evaluation 02/06/17   Authorization Type Medicare primary; [employer] insurance secondary; Medicaid tertiary    Authorization Time Period 01/09/17 to 03/11/17   Authorization - Visit Number 4   Authorization - Number of Visits 10   PT Start Time 0903   PT Stop Time 0942   PT Time Calculation (min) 39 min   Activity Tolerance Patient tolerated treatment well   Behavior During Therapy [facility] for tasks assessed/performed      Past Medical History:  Diagnosis Date  . Anxiety    takes alprazolam - rare use   . Arthritis    cervical spondylosis   . Asthma    SEES  PULM W/ [place]    . Balance problem   . Brain cyst   . Depression   . Difficult intubation    Small mouth opening, limited neck flexion, very anterior   . GERD (gastroesophageal reflux disease)    pt. reports that its better, no meds in use at this time- 2015  . Headache   . Herpes   . History of kidney stones   . History of pneumonia   . Hypertension    pt. doesn't see cardiologist, followed for HTN by Dr. Gerarda Fraction  . Inflammation of shoulder joint   . Memory difficulties   . Pneumothorax on right    following C4-6 ACDF 07/04/16  . PONV (postoperative nausea and vomiting)    WITH SURGERY 07/04/16 (NECK) RT LUNG COLLAPSED  . Recurrent falls   . Shortness of breath dyspnea    WITH EXERTION   . Urinary frequency     Past Surgical History:  Procedure Laterality Date  . ABDOMINAL HYSTERECTOMY    . ANTERIOR CERVICAL DECOMP/DISCECTOMY FUSION N/A 08/06/2014   Procedure: ANTERIOR CERVICAL  DECOMPRESSION/DISCECTOMY FUSION CERVICAL THREE-FOUR,CERVICAL SIX-SEVEN ,CERVICAL SEVEN-THORACIC ONE;  Surgeon: Floyce Stakes, MD;  Location: [place];  Service: Neurosurgery;  Laterality: N/A;  . ANTERIOR CERVICAL DECOMP/DISCECTOMY FUSION N/A 07/04/2016   Procedure: CERVICAL FOUR-FIVE, CERVICAL FIVE-SIX ANTERIOR CERVICAL DECOMPRESSION/DISCECTOMY/FUSION WITH REVISION OF CERVICAL THREE-FOUR PLATE;  Surgeon: Leeroy Cha, MD;  Location: [place];  Service: Neurosurgery;  Laterality: N/A;  . CARPAL TUNNEL RELEASE Left 02/16/2013   Procedure: CARPAL TUNNEL RELEASE;  Surgeon: Carole Civil, MD;  [facility];  Service: Orthopedics;  Laterality: Left;  . CARPAL TUNNEL RELEASE Right 03/27/2013   Procedure: RIGHT CARPAL TUNNEL RELEASE;  Surgeon: Carole Civil, MD;  [facility];  Service: Orthopedics;  Laterality: Right;  . CESAREAN SECTION     x2  . CHOLECYSTECTOMY N/A 06/17/2015   Procedure: LAPAROSCOPIC CHOLECYSTECTOMY;  Surgeon: Aviva Signs, MD;  [facility];  Service: General;  Laterality: N/A;  . COLONOSCOPY    . FOREIGN BODY REMOVAL Left    knee-as child  . NASAL SINUS SURGERY N/A 04/13/2015   Procedure: nasal endoscopy with adenoid biopsy;  Surgeon: Ruby Cola, MD;  Location: [place];  Service: ENT;  Laterality: N/A;  . NECK SURGERY  2016  . POSTERIOR CERVICAL FUSION/FORAMINOTOMY N/A 11/13/2016   Procedure: CERVICAL TWO-CERVICAL SIX  POSTERIOR CERVICAL FUSION WITH LATERAL MASS FIXATION;  Surgeon: Kristeen Miss, MD;  Location: [place];  Service: Neurosurgery;  Laterality: N/A;  posterior approach  . ROTATOR CUFF REPAIR Left   . SHOULDER ACROMIOPLASTY Left 05/18/2015   Procedure: SHOULDER ACROMIOPLASTY;  Surgeon: Earlie Server, MD;  Location: [place];  Service: Orthopedics;  Laterality: Left;  . SHOULDER ARTHROSCOPY WITH DISTAL CLAVICLE RESECTION Left 05/18/2015   Procedure: LEFT SHOULDER ARTHROSCOPY WITH  DISTAL CLAVICLE RESECTION;  Surgeon: Earlie Server, MD;   Location: [place];  Service: Orthopedics;  Laterality: Left;    There were no vitals filed for this visit.      Subjective Assessment - 01/25/17 0904    Subjective Patient arrives stating that she is feeling better, she slept better last night. She is still stiff. Nothing else major going on.    Patient Stated Goals reduce pain, just move better in general    Currently in Pain? Yes   Pain Score 5    Pain Location Neck   Pain Orientation Posterior                         [facility] Adult PT Treatment/Exercise - 01/25/17 0001      Neck Exercises: Seated   Neck Retraction 10 reps   Neck Retraction Limitations 5 second holds, cues for form    Shoulder Rolls Backwards;15 reps   Other Seated Exercise 3D cervical and thoracic excursions 1x15 seated      Manual Therapy   Manual Therapy Soft tissue mobilization;Myofascial release   Manual therapy comments Manual therapy completed seperately to all other interventions this date   Soft tissue mobilization B traps, levators, scalenes, occipital mucculature    Myofascial Release L upper trap muscle belly      Neck Exercises: Stretches   Upper Trapezius Stretch 2 reps;30 seconds   Levator Stretch 2 reps;30 seconds   Corner Stretch 2 reps;30 seconds   Other Neck Stretches SCM stretch 2x30 seconds                 PT Education - 01/25/17 0943    Education provided Yes   Education Details HEP updates    Person(s) Educated Patient   Methods Explanation;Demonstration;Handout   Comprehension Verbalized understanding;Returned demonstration;Need further instruction          PT Short Term Goals - 01/09/17 1208      PT SHORT TERM GOAL #1   Title Patient to improve cervical ROM by at least 20 degrees on all planes in order to reduce pain and improve QOL    Time 4   Period Weeks   Status New     PT SHORT TERM GOAL #2   Title Patient to demonstrate thoracic ROM as being WFL on all planes in order to  reduce pain and improve QOL    Time 4   Period Weeks   Status New     PT SHORT TERM GOAL #3   Title Patient to improve functional B shoulder ER and IR by at least 3 vertebral levels in order to show improved mobility and to improve functional task performance    Time 4   Period Weeks   Status New     PT SHORT TERM GOAL #4   Title Patient to experience radicular symptoms no further than proximal 20% of her L humerus in order to show general symptoms improvement    Time 4   Period Weeks  Status New     PT SHORT TERM GOAL #5   Title Patient to be compliant with HEP, to be updated PRN    Time 1   Period Weeks   Status New           PT Long Term Goals - 01/09/17 1210      PT LONG TERM GOAL #1   Title Patient to be able to maintain correct posture at least 70% of the time without cues or increased pain in order to improve general mechanics    Time 8   Period Weeks   Status New     PT LONG TERM GOAL #2   Title Patient to demonstrate cervical ROM as being no more than 20 degrees of normal on all planes in order to reduce pain and improve functional task tolerance    Time 8   Period Weeks   Status New     PT LONG TERM GOAL #3   Title Patient to experience cervical pain no more than 2/10 with all functional tasks and activities in order to improve QOL and functional task tolerance    Time 8   Period Weeks   Status New     PT LONG TERM GOAL #4   Title Patient to be able to complete all functional self-care activiteis, including washing her hair and dressing, with no difficulty and pain no more than 2/10 in order to improve functional task tolerance    Time 8   Period Weeks   Status New     PT LONG TERM GOAL #5   Title Patient to experience no radicular symptoms L UE in order to show general improvement of condition    Time 8   Period Weeks   Status New               Plan - 01/25/17 0943    Clinical Impression Statement Patient arrives today reporting she  slept better last night and is feeling a little better in general. Continued with general mobility work for cervico-thoracic region as well as postural strengthening and ongoing work with extensive manual to muscles in spasm. Cues provided during session to correct form as needed. Introduced functional stretch for cervical musculature. Continue to plan to have dry needling clinician address education regarding further education regarding dry needling process moving forward. Pain 1/10 at [facility].    Rehab Potential Fair   Clinical Impairments Affecting Rehab Potential (+) motivated to participate with PT; (-) medical and surgical history, chronicity of pain    PT Frequency 2x / week   PT Duration 8 weeks   PT Treatment/Interventions ADLs/Self Care Home Management;Biofeedback;Cryotherapy;Electrical Stimulation;Moist Heat;Ultrasound;Functional mobility training;Therapeutic activities;Therapeutic exercise;Balance training;Neuromuscular re-education;Patient/family education;Manual techniques;Scar mobilization;Passive range of motion;Dry needling;Taping   PT Next Visit Plan continue to work on cervico-thoracic mobility, extensive manual. Review functional stretches. EDUCATE ON DRY NEEDLING WHEN CLINICIAN FREE/ABLE.    PT Home Exercise Plan Eval: lumbar roll for posture, cervical and thoracic excursions; 6/29: upper trap, levator, SCM stretches    Consulted and Agree with Plan of Care Patient      Patient will benefit from skilled therapeutic intervention in order to improve the following deficits and impairments:  Increased fascial restricitons, Improper body mechanics, Pain, Increased muscle spasms, Postural dysfunction, Decreased range of motion, Decreased strength, Hypomobility, Impaired UE functional use, Impaired flexibility  Visit Diagnosis: Cervicalgia  Abnormal posture  Stiffness of right shoulder, not elsewhere classified  Stiffness of left shoulder, not elsewhere classified  Cramp  and  spasm     Problem List Patient Active Problem List   Diagnosis Date Noted  . Pseudoarthrosis of cervical spine ([place]) 11/13/2016  . Asthma 07/05/2016  . Acute chest wall pain 07/05/2016  . GERD (gastroesophageal reflux disease) 07/05/2016  . Spontaneous pneumothorax   . Cervical stenosis of spinal canal 08/06/2014  . Muscle weakness (generalized) 11/12/2013  . Tight fascia 11/12/2013  . Decreased range of motion of shoulder 11/12/2013  . Cervical spondylosis without myelopathy 10/29/2013  . S/P carpal tunnel release 02/19/2013  . CTS (carpal tunnel syndrome) 02/19/2013  . SHOULDER PAIN 10/06/2007  . IMPINGEMENT SYNDROME 10/06/2007  . RUPTURE ROTATOR CUFF 10/06/2007  . HTN (hypertension) 10/03/2007    Deniece Ree PT, DPT [place] [address] [place], [place], [address] Phone: [phone]   Fax:  [phone]  Name: Kimberly [name] MRN: [id number] Date of Birth: 07-14-1961

## 2017-01-28 ENCOUNTER — Telehealth (HOSPITAL_COMMUNITY): Payer: Self-pay | Admitting: Physical Therapy

## 2017-01-28 ENCOUNTER — Ambulatory Visit (HOSPITAL_COMMUNITY): Payer: Medicare Other | Admitting: Physical Therapy

## 2017-01-28 IMAGING — DX DG SHOULDER 2+V*L*
3 series · 3 of 3 positions shown · non-contrast
Comparison: None.

CLINICAL DATA: Fall several months ago. Left shoulder pain. Initial
encounter.

EXAM:
LEFT SHOULDER - 2+ VIEW

[shoulder ap]
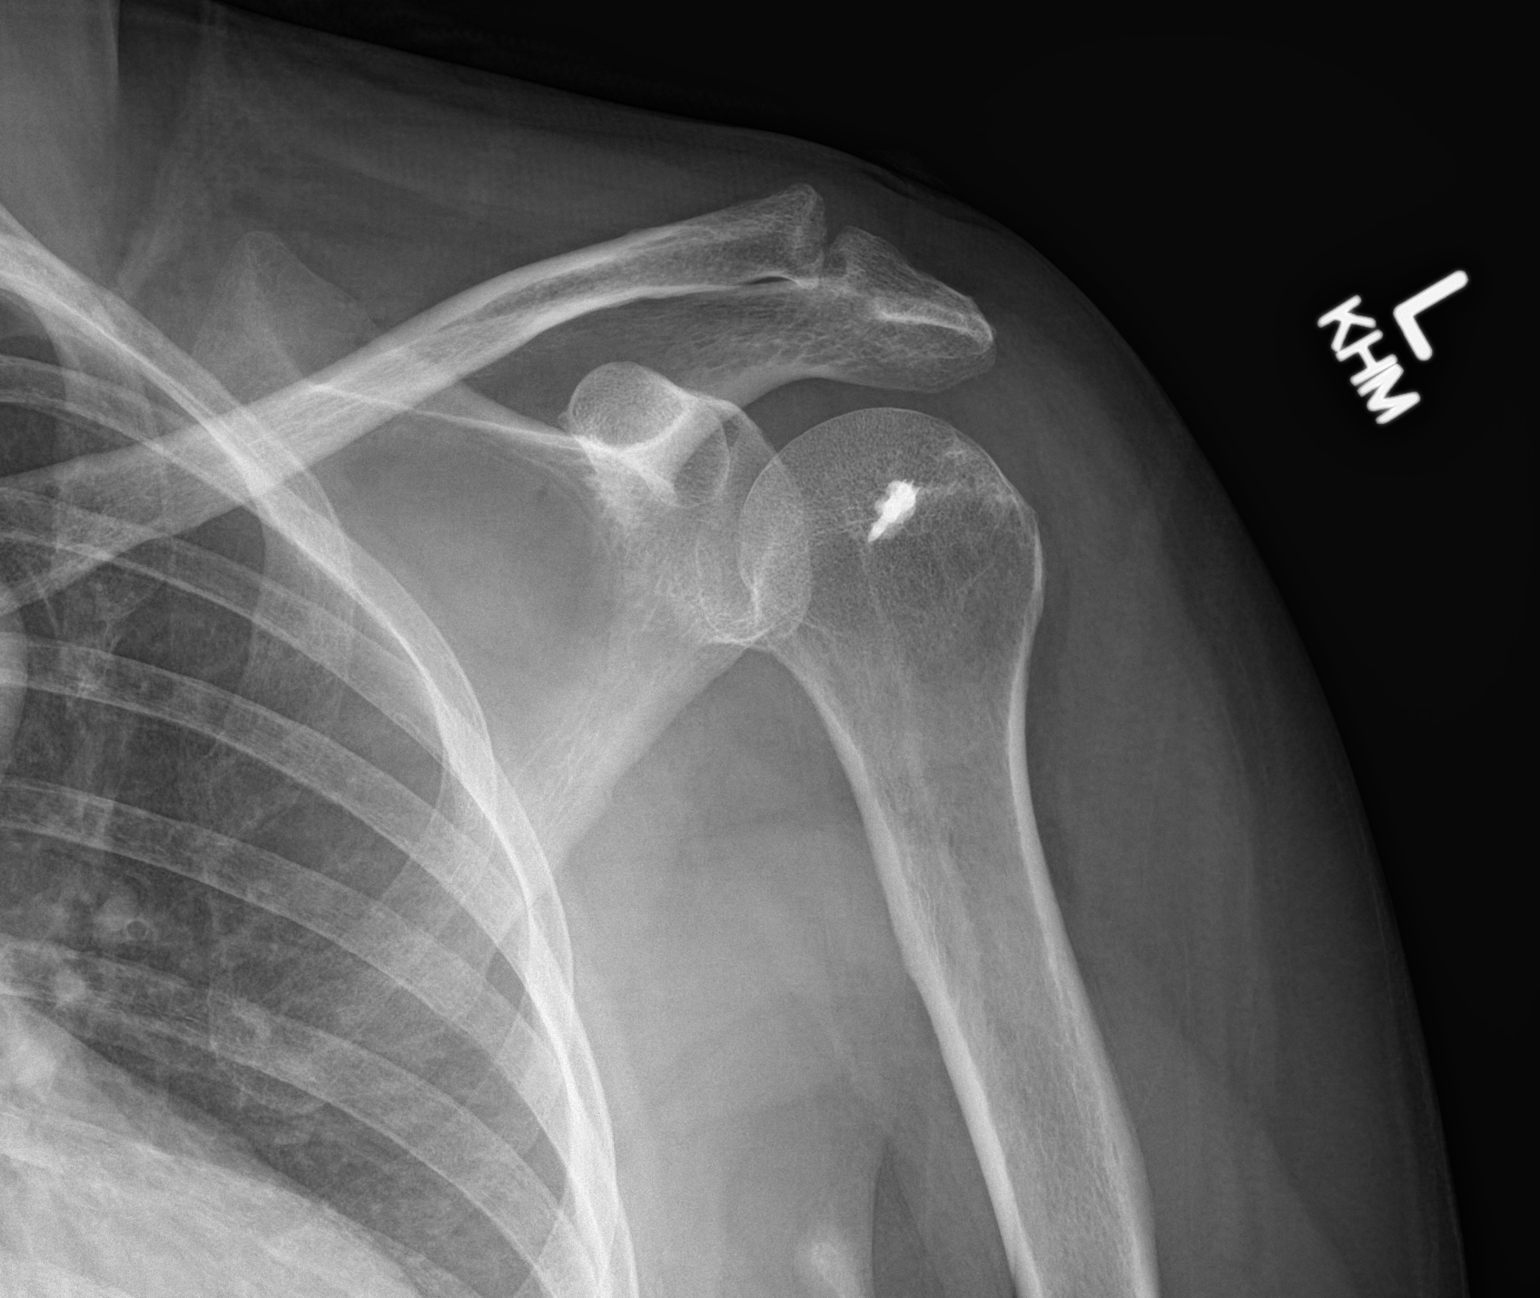

[shoulder y view]
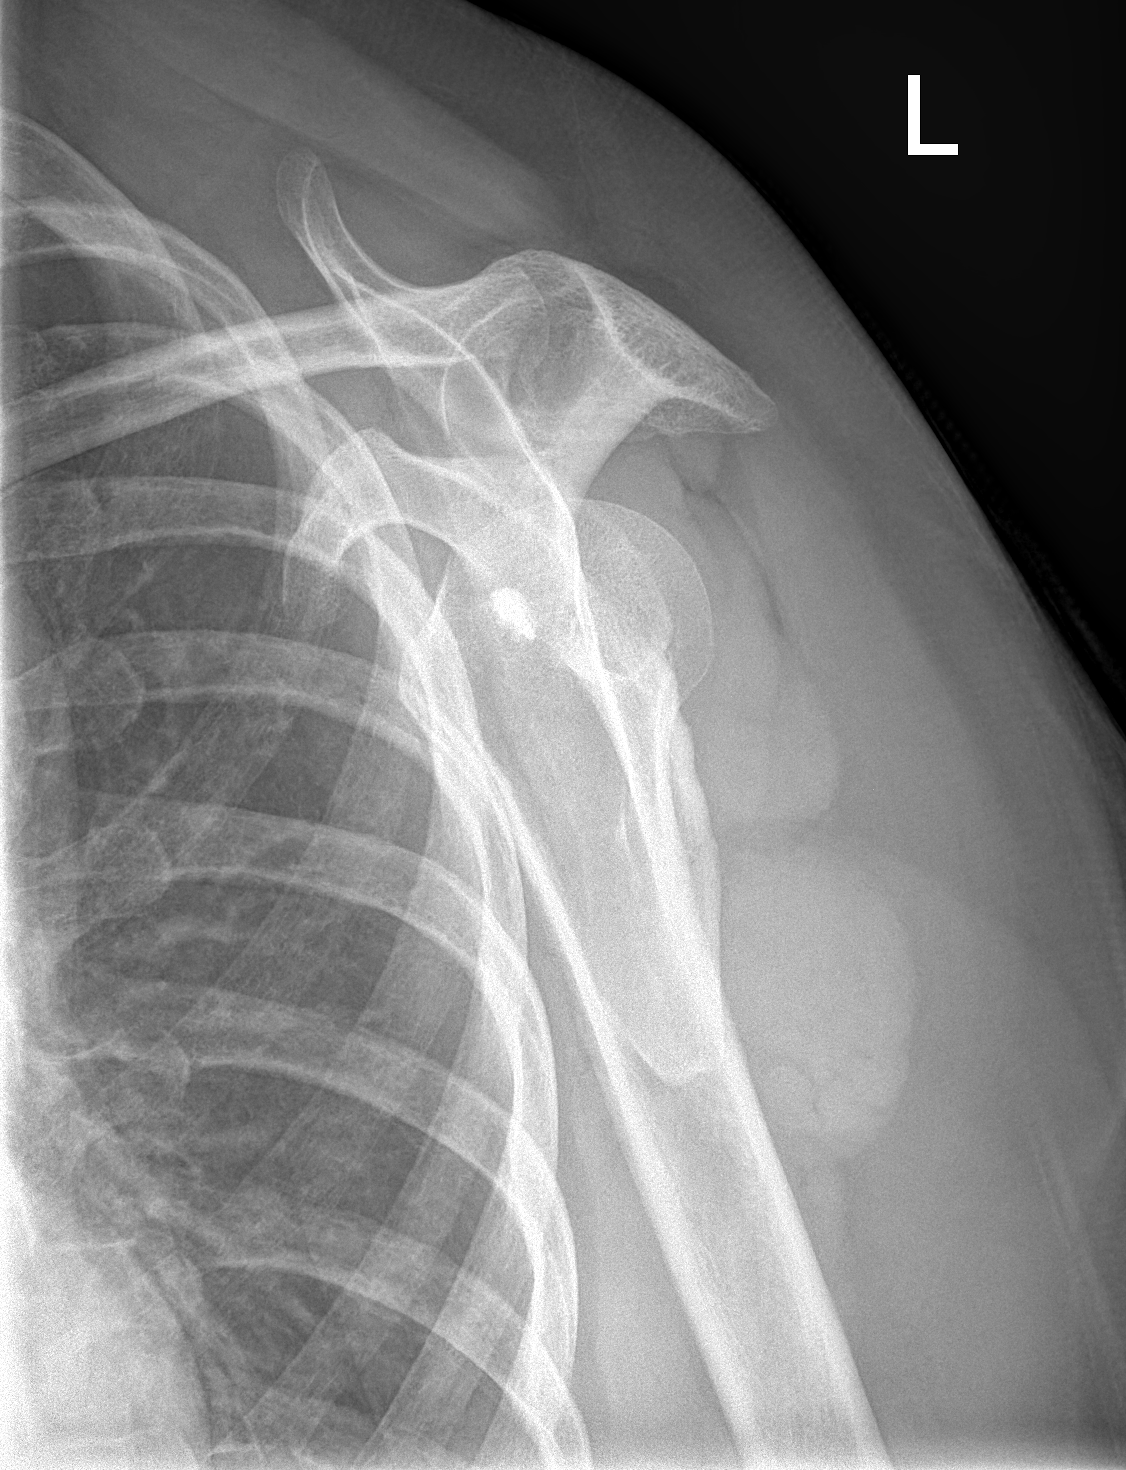

[shoulder axillary]
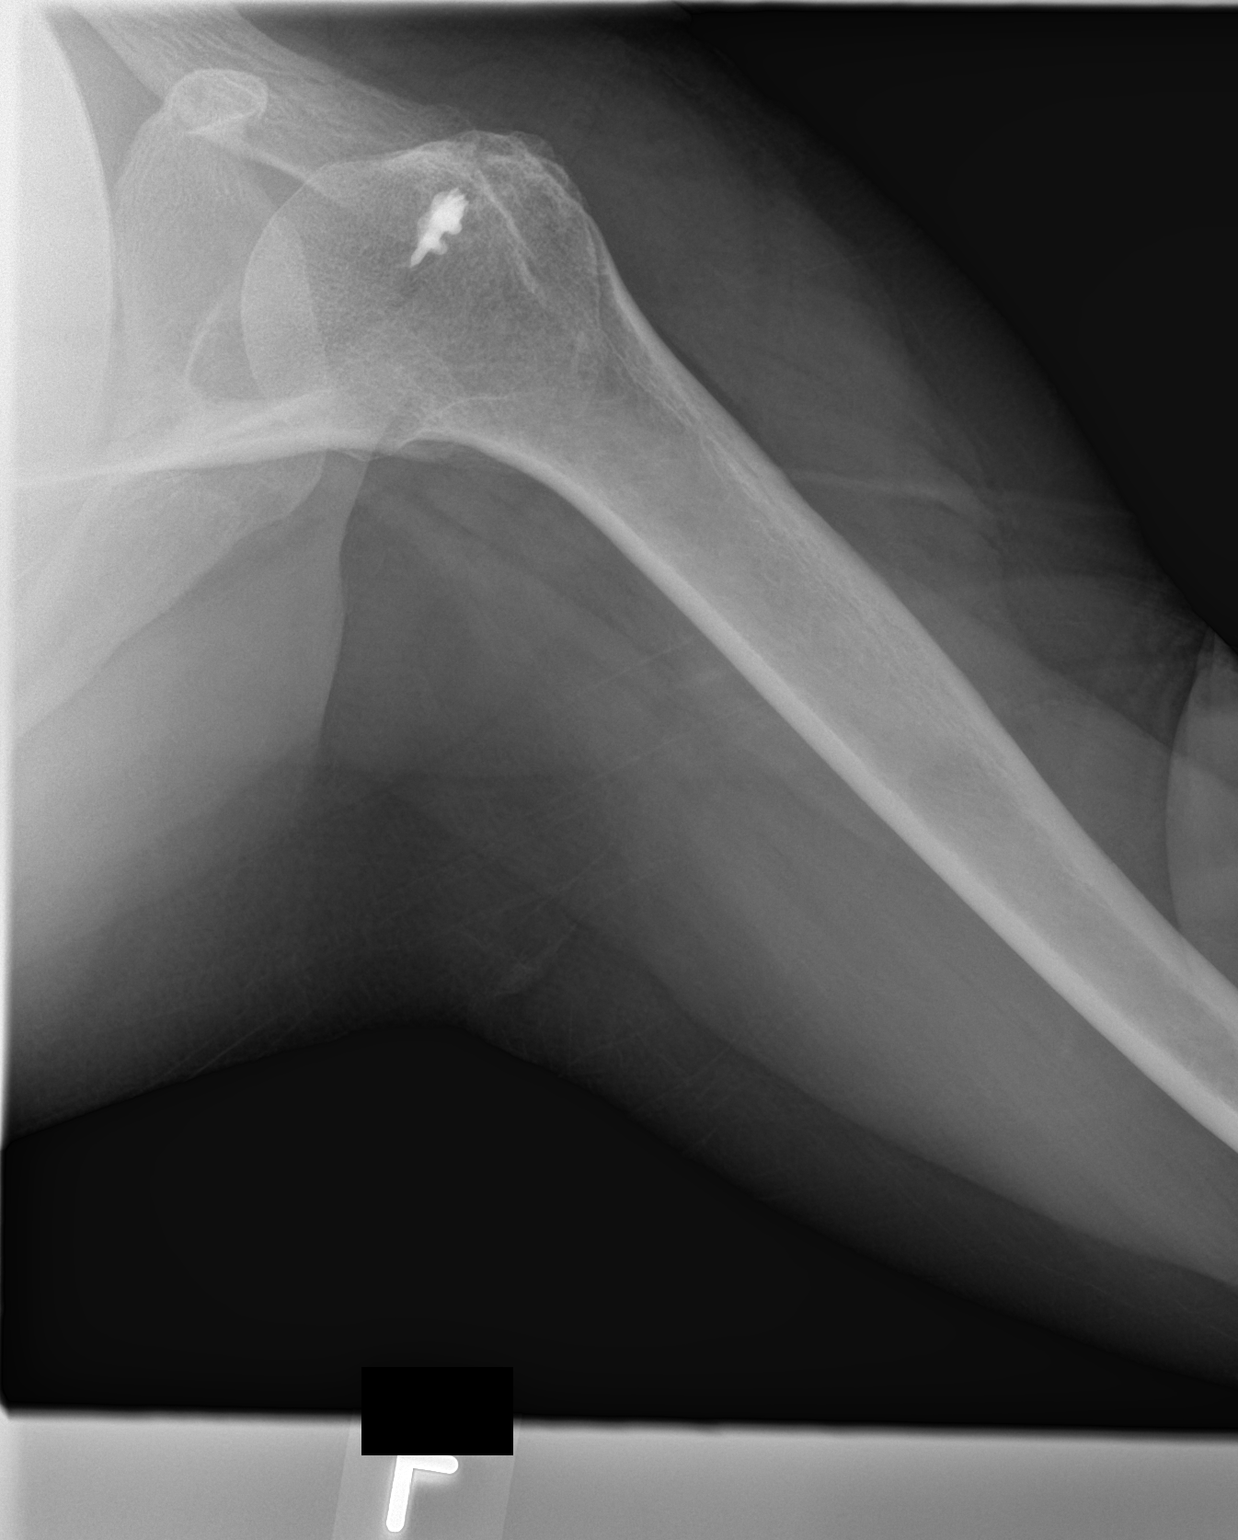

[3 of 3 positions shown; findings below may reference images not displayed]

FINDINGS: There is no evidence of fracture or dislocation. Mild degenerative
spurring seen involving the acromioclavicular joint. A surgical tack
is seen in the humeral head at the rotator cuff insertion site.
IMPRESSION: No acute findings.

Mild acromioclavicular degenerative changes.

## 2017-01-28 NOTE — Telephone Encounter (Signed)
She is not feeling well today °

## 2017-01-29 ENCOUNTER — Encounter (HOSPITAL_COMMUNITY): Payer: Medicare Other

## 2017-01-31 ENCOUNTER — Ambulatory Visit (HOSPITAL_COMMUNITY): Payer: Medicare Other | Attending: Neurological Surgery | Admitting: Physical Therapy

## 2017-01-31 DIAGNOSIS — M25611 Stiffness of right shoulder, not elsewhere classified: Secondary | ICD-10-CM

## 2017-01-31 DIAGNOSIS — R293 Abnormal posture: Secondary | ICD-10-CM | POA: Diagnosis not present

## 2017-01-31 DIAGNOSIS — R252 Cramp and spasm: Secondary | ICD-10-CM | POA: Insufficient documentation

## 2017-01-31 DIAGNOSIS — M542 Cervicalgia: Secondary | ICD-10-CM | POA: Diagnosis not present

## 2017-01-31 DIAGNOSIS — M25612 Stiffness of left shoulder, not elsewhere classified: Secondary | ICD-10-CM | POA: Diagnosis not present

## 2017-01-31 NOTE — Therapy (Signed)
[place], [place], [address] Phone: [phone]   Fax:  [phone]  Physical Therapy Treatment  Patient Details  Name: Kimberly [name] MRN: [id number] Date of Birth: 23-Nov-1960 Referring Provider: Kristeen Miss   Encounter Date: 01/31/2017      PT End of Session - 01/31/17 1027    Visit Number 5   Number of Visits 17   Date for PT Re-Evaluation 02/06/17   Authorization Type Medicare primary; [employer] insurance secondary; Medicaid tertiary    Authorization Time Period 01/09/17 to 03/11/17   Authorization - Visit Number 5   Authorization - Number of Visits 10   PT Start Time [phone]   PT Stop Time 1026   PT Time Calculation (min) 38 min   Activity Tolerance Patient tolerated treatment well   Behavior During Therapy [facility] for tasks assessed/performed      Past Medical History:  Diagnosis Date  . Anxiety    takes alprazolam - rare use   . Arthritis    cervical spondylosis   . Asthma    SEES  PULM W/ [place]    . Balance problem   . Brain cyst   . Depression   . Difficult intubation    Small mouth opening, limited neck flexion, very anterior   . GERD (gastroesophageal reflux disease)    pt. reports that its better, no meds in use at this time- 2015  . Headache   . Herpes   . History of kidney stones   . History of pneumonia   . Hypertension    pt. doesn't see cardiologist, followed for HTN by Dr. Gerarda Fraction  . Inflammation of shoulder joint   . Memory difficulties   . Pneumothorax on right    following C4-6 ACDF 07/04/16  . PONV (postoperative nausea and vomiting)    WITH SURGERY 07/04/16 (NECK) RT LUNG COLLAPSED  . Recurrent falls   . Shortness of breath dyspnea    WITH EXERTION   . Urinary frequency     Past Surgical History:  Procedure Laterality Date  . ABDOMINAL HYSTERECTOMY    . ANTERIOR CERVICAL DECOMP/DISCECTOMY FUSION N/A 08/06/2014   Procedure: ANTERIOR CERVICAL  DECOMPRESSION/DISCECTOMY FUSION CERVICAL THREE-FOUR,CERVICAL SIX-SEVEN ,CERVICAL SEVEN-THORACIC ONE;  Surgeon: Floyce Stakes, MD;  Location: [place];  Service: Neurosurgery;  Laterality: N/A;  . ANTERIOR CERVICAL DECOMP/DISCECTOMY FUSION N/A 07/04/2016   Procedure: CERVICAL FOUR-FIVE, CERVICAL FIVE-SIX ANTERIOR CERVICAL DECOMPRESSION/DISCECTOMY/FUSION WITH REVISION OF CERVICAL THREE-FOUR PLATE;  Surgeon: Leeroy Cha, MD;  Location: [place];  Service: Neurosurgery;  Laterality: N/A;  . CARPAL TUNNEL RELEASE Left 02/16/2013   Procedure: CARPAL TUNNEL RELEASE;  Surgeon: Carole Civil, MD;  [facility];  Service: Orthopedics;  Laterality: Left;  . CARPAL TUNNEL RELEASE Right 03/27/2013   Procedure: RIGHT CARPAL TUNNEL RELEASE;  Surgeon: Carole Civil, MD;  [facility];  Service: Orthopedics;  Laterality: Right;  . CESAREAN SECTION     x2  . CHOLECYSTECTOMY N/A 06/17/2015   Procedure: LAPAROSCOPIC CHOLECYSTECTOMY;  Surgeon: Aviva Signs, MD;  Location[facility];  Service: General;  Laterality: N/A;  . COLONOSCOPY    . FOREIGN BODY REMOVAL Left    knee-as child  . NASAL SINUS SURGERY N/A 04/13/2015   Procedure: nasal endoscopy with adenoid biopsy;  Surgeon: Ruby Cola, MD;  Location: [place];  Service: ENT;  Laterality: N/A;  . NECK SURGERY  2016  . POSTERIOR CERVICAL FUSION/FORAMINOTOMY N/A 11/13/2016   Procedure: CERVICAL TWO-CERVICAL SIX  POSTERIOR CERVICAL FUSION WITH LATERAL MASS FIXATION;  Surgeon: Kristeen Miss, MD;  Location: [place];  Service: Neurosurgery;  Laterality: N/A;  posterior approach  . ROTATOR CUFF REPAIR Left   . SHOULDER ACROMIOPLASTY Left 05/18/2015   Procedure: SHOULDER ACROMIOPLASTY;  Surgeon: Earlie Server, MD;  Location: [place];  Service: Orthopedics;  Laterality: Left;  . SHOULDER ARTHROSCOPY WITH DISTAL CLAVICLE RESECTION Left 05/18/2015   Procedure: LEFT SHOULDER ARTHROSCOPY WITH  DISTAL CLAVICLE RESECTION;  Surgeon: Earlie Server, MD;   Location: [place];  Service: Orthopedics;  Laterality: Left;    There were no vitals filed for this visit.      Subjective Assessment - 01/31/17 0950    Subjective Patient arrives stating that she is having some pain this morning, she is not sure what flared her up. She apologizes for being sick the other day and not coming. She continues to remain generally anxious appearing.    Pertinent History C2-C6 posterior fusion 10/2016, HTN, asthma, hx of carpal tunnel (B but resolved), history of multiple rotator cuff surgeries (resolved), history of impingement syndrome, history of cervical spinal stenosis, anxiety    Patient Stated Goals reduce pain, just move better in general    Currently in Pain? Yes   Pain Score 7    Pain Location Neck   Pain Orientation Posterior   Pain Descriptors / Indicators Burning;Sharp;Throbbing   Pain Type Chronic pain   Pain Radiating Towards down B UEs to almost the elbow    Pain Onset More than a month ago   Pain Frequency Constant   Aggravating Factors  certain movements in general    Pain Relieving Factors lyaing down, muscle relaxers    Effect of Pain on Daily Activities severe limitation                          [facility] Adult PT Treatment/Exercise - 01/31/17 0001      Neck Exercises: Seated   Other Seated Exercise scapular depression 1x10, scap retraction 1x10    Other Seated Exercise 3D cervical excursions  1x10      Manual Therapy   Manual Therapy Soft tissue mobilization;Myofascial release   Manual therapy comments Manual therapy completed seperately to all other interventions this date   Soft tissue mobilization B traps, levators, scalenes, occipital mucculature, L rhomboids     Myofascial Release B upper trap muscle bellies                 PT Education - 01/31/17 1026    Education provided Yes   Education Details general dry needling education and handout provided today, use of SPC for trigger point  release at home    Person(s) Educated Patient   Methods Explanation;Handout   Comprehension Verbalized understanding          PT Short Term Goals - 01/09/17 1208      PT SHORT TERM GOAL #1   Title Patient to improve cervical ROM by at least 20 degrees on all planes in order to reduce pain and improve QOL    Time 4   Period Weeks   Status New     PT SHORT TERM GOAL #2   Title Patient to demonstrate thoracic ROM as being WFL on all planes in order to reduce pain and improve QOL    Time 4   Period Weeks   Status New     PT SHORT TERM GOAL #3  Title Patient to improve functional B shoulder ER and IR by at least 3 vertebral levels in order to show improved mobility and to improve functional task performance    Time 4   Period Weeks   Status New     PT SHORT TERM GOAL #4   Title Patient to experience radicular symptoms no further than proximal 20% of her L humerus in order to show general symptoms improvement    Time 4   Period Weeks   Status New     PT SHORT TERM GOAL #5   Title Patient to be compliant with HEP, to be updated PRN    Time 1   Period Weeks   Status New           PT Long Term Goals - 01/09/17 1210      PT LONG TERM GOAL #1   Title Patient to be able to maintain correct posture at least 70% of the time without cues or increased pain in order to improve general mechanics    Time 8   Period Weeks   Status New     PT LONG TERM GOAL #2   Title Patient to demonstrate cervical ROM as being no more than 20 degrees of normal on all planes in order to reduce pain and improve functional task tolerance    Time 8   Period Weeks   Status New     PT LONG TERM GOAL #3   Title Patient to experience cervical pain no more than 2/10 with all functional tasks and activities in order to improve QOL and functional task tolerance    Time 8   Period Weeks   Status New     PT LONG TERM GOAL #4   Title Patient to be able to complete all functional self-care  activiteis, including washing her hair and dressing, with no difficulty and pain no more than 2/10 in order to improve functional task tolerance    Time 8   Period Weeks   Status New     PT LONG TERM GOAL #5   Title Patient to experience no radicular symptoms L UE in order to show general improvement of condition    Time 8   Period Weeks   Status New               Plan - 01/31/17 1028    Clinical Impression Statement Patient arrives reporting increase of pain today, she is not sure what caused it. Education provided by dry needling clinician regarding dry needling in general, patient reports interest and willingness to trial this intervention. Due to increased pain today focused on STM as this has been effective in significantly reducing pain in prior sessions, as well as stretches and functional exercise as time allowed today.    PT Frequency 2x / week   PT Duration 8 weeks   PT Treatment/Interventions ADLs/Self Care Home Management;Biofeedback;Cryotherapy;Electrical Stimulation;Moist Heat;Ultrasound;Functional mobility training;Therapeutic activities;Therapeutic exercise;Balance training;Neuromuscular re-education;Patient/family education;Manual techniques;Scar mobilization;Passive range of motion;Dry needling;Taping   PT Next Visit Plan continue heavy manual focus, get to dry needling clinician when able schedule wise.    PT Home Exercise Plan Eval: lumbar roll for posture, cervical and thoracic excursions; 6/29: upper trap, levator, SCM stretches    Consulted and Agree with Plan of Care Patient      Patient will benefit from skilled therapeutic intervention in order to improve the following deficits and impairments:  Increased fascial restricitons, Improper body mechanics, Pain, Increased muscle spasms,  Postural dysfunction, Decreased range of motion, Decreased strength, Hypomobility, Impaired UE functional use, Impaired flexibility  Visit Diagnosis: Cervicalgia  Abnormal  posture  Stiffness of right shoulder, not elsewhere classified  Stiffness of left shoulder, not elsewhere classified  Cramp and spasm     Problem List Patient Active Problem List   Diagnosis Date Noted  . Pseudoarthrosis of cervical spine ([place]) 11/13/2016  . Asthma 07/05/2016  . Acute chest wall pain 07/05/2016  . GERD (gastroesophageal reflux disease) 07/05/2016  . Spontaneous pneumothorax   . Cervical stenosis of spinal canal 08/06/2014  . Muscle weakness (generalized) 11/12/2013  . Tight fascia 11/12/2013  . Decreased range of motion of shoulder 11/12/2013  . Cervical spondylosis without myelopathy 10/29/2013  . S/P carpal tunnel release 02/19/2013  . CTS (carpal tunnel syndrome) 02/19/2013  . SHOULDER PAIN 10/06/2007  . IMPINGEMENT SYNDROME 10/06/2007  . RUPTURE ROTATOR CUFF 10/06/2007  . HTN (hypertension) 10/03/2007    Kimberly [name] PT, DPT [place] [address] [place], [place], [address] Phone: [phone]   Fax:  [phone]  Name: Kimberly [name] MRN: [id number] Date of Birth: 06/07/1961

## 2017-02-04 ENCOUNTER — Encounter (HOSPITAL_COMMUNITY): Payer: Self-pay

## 2017-02-04 ENCOUNTER — Emergency Department (HOSPITAL_COMMUNITY)
Admission: EM | Admit: 2017-02-04 | Discharge: 2017-02-04 | Disposition: A | Discharge status: Home / Self Care | Payer: Medicare Other | Attending: Emergency Medicine | Admitting: Emergency Medicine

## 2017-02-04 ENCOUNTER — Ambulatory Visit (HOSPITAL_COMMUNITY): Payer: Medicare Other | Admitting: Physical Therapy

## 2017-02-04 ENCOUNTER — Emergency Department (HOSPITAL_COMMUNITY): Payer: Medicare Other

## 2017-02-04 DIAGNOSIS — Y92008 Other place in unspecified non-institutional (private) residence as the place of occurrence of the external cause: Secondary | ICD-10-CM | POA: Diagnosis not present

## 2017-02-04 DIAGNOSIS — Z91010 Allergy to peanuts: Secondary | ICD-10-CM | POA: Insufficient documentation

## 2017-02-04 DIAGNOSIS — M542 Cervicalgia: Secondary | ICD-10-CM

## 2017-02-04 DIAGNOSIS — M25611 Stiffness of right shoulder, not elsewhere classified: Secondary | ICD-10-CM | POA: Diagnosis not present

## 2017-02-04 DIAGNOSIS — R252 Cramp and spasm: Secondary | ICD-10-CM | POA: Diagnosis not present

## 2017-02-04 DIAGNOSIS — J45909 Unspecified asthma, uncomplicated: Secondary | ICD-10-CM | POA: Diagnosis not present

## 2017-02-04 DIAGNOSIS — I10 Essential (primary) hypertension: Secondary | ICD-10-CM | POA: Insufficient documentation

## 2017-02-04 DIAGNOSIS — R293 Abnormal posture: Secondary | ICD-10-CM | POA: Diagnosis not present

## 2017-02-04 DIAGNOSIS — Y939 Activity, unspecified: Secondary | ICD-10-CM | POA: Insufficient documentation

## 2017-02-04 DIAGNOSIS — G4489 Other headache syndrome: Secondary | ICD-10-CM | POA: Diagnosis not present

## 2017-02-04 DIAGNOSIS — Y999 Unspecified external cause status: Secondary | ICD-10-CM | POA: Insufficient documentation

## 2017-02-04 DIAGNOSIS — R0789 Other chest pain: Secondary | ICD-10-CM | POA: Insufficient documentation

## 2017-02-04 DIAGNOSIS — S0993XA Unspecified injury of face, initial encounter: Secondary | ICD-10-CM | POA: Diagnosis not present

## 2017-02-04 DIAGNOSIS — M79632 Pain in left forearm: Secondary | ICD-10-CM | POA: Diagnosis not present

## 2017-02-04 DIAGNOSIS — Z0389 Encounter for observation for other suspected diseases and conditions ruled out: Secondary | ICD-10-CM | POA: Diagnosis not present

## 2017-02-04 DIAGNOSIS — M25512 Pain in left shoulder: Secondary | ICD-10-CM | POA: Diagnosis not present

## 2017-02-04 DIAGNOSIS — Z79899 Other long term (current) drug therapy: Secondary | ICD-10-CM | POA: Insufficient documentation

## 2017-02-04 DIAGNOSIS — S4992XA Unspecified injury of left shoulder and upper arm, initial encounter: Secondary | ICD-10-CM | POA: Diagnosis not present

## 2017-02-04 DIAGNOSIS — S0083XA Contusion of other part of head, initial encounter: Secondary | ICD-10-CM | POA: Diagnosis not present

## 2017-02-04 DIAGNOSIS — S59912A Unspecified injury of left forearm, initial encounter: Secondary | ICD-10-CM | POA: Diagnosis not present

## 2017-02-04 DIAGNOSIS — W228XXA Striking against or struck by other objects, initial encounter: Secondary | ICD-10-CM | POA: Diagnosis not present

## 2017-02-04 DIAGNOSIS — R51 Headache: Secondary | ICD-10-CM | POA: Diagnosis not present

## 2017-02-04 DIAGNOSIS — M25612 Stiffness of left shoulder, not elsewhere classified: Secondary | ICD-10-CM

## 2017-02-04 DIAGNOSIS — S199XXA Unspecified injury of neck, initial encounter: Secondary | ICD-10-CM | POA: Diagnosis not present

## 2017-02-04 DIAGNOSIS — S0990XA Unspecified injury of head, initial encounter: Secondary | ICD-10-CM | POA: Diagnosis not present

## 2017-02-04 DIAGNOSIS — M79602 Pain in left arm: Secondary | ICD-10-CM | POA: Diagnosis not present

## 2017-02-04 DIAGNOSIS — S6992XA Unspecified injury of left wrist, hand and finger(s), initial encounter: Secondary | ICD-10-CM | POA: Diagnosis not present

## 2017-02-04 DIAGNOSIS — S098XXA Other specified injuries of head, initial encounter: Secondary | ICD-10-CM | POA: Diagnosis not present

## 2017-02-04 DIAGNOSIS — M79622 Pain in left upper arm: Secondary | ICD-10-CM | POA: Diagnosis not present

## 2017-02-04 DIAGNOSIS — Z8709 Personal history of other diseases of the respiratory system: Secondary | ICD-10-CM | POA: Insufficient documentation

## 2017-02-04 MED ORDER — BACLOFEN 5 MG PO TABS: 10.0000 mg | ORAL_TABLET | Freq: Three times a day (TID) | ORAL | 0 refills | Status: DC | PRN

## 2017-02-04 MED ORDER — IBUPROFEN 400 MG PO TABS: 400.0000 mg | ORAL_TABLET | Freq: Four times a day (QID) | ORAL | 0 refills | Status: DC | PRN

## 2017-02-04 MED ORDER — MORPHINE SULFATE (PF) 10 MG/ML IV SOLN: 10.0000 mg | Freq: Once | INTRAVENOUS | Status: DC

## 2017-02-04 MED ORDER — MORPHINE SULFATE (PF) 10 MG/ML IV SOLN
10.0000 mg | Freq: Once | INTRAVENOUS | Status: AC
  Administered 2017-02-04: 10 mg via INTRAMUSCULAR
  Filled 2017-02-04: qty 1

## 2017-02-04 MED ORDER — IOPAMIDOL (ISOVUE-300) INJECTION 61%
75.0000 mL | Freq: Once | INTRAVENOUS | Status: AC | PRN
Start: 2017-02-04 — End: 2017-02-04
  Administered 2017-02-04: 75 mL via INTRAVENOUS
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
## 2017-02-04 NOTE — Discharge Instructions (Signed)
X-ray showed no broken bones. You'll be sore for several days. Prescription for pain medicine and muscle relaxer.

## 2017-02-04 NOTE — Therapy (Signed)
[place], [place], [address] Phone: [phone]   Fax:  [phone]  Physical Therapy Treatment (Re-Assessment)  Patient Details  Name: Kimberly [name] MRN: [id number] Date of Birth: 1960/09/21 Referring Provider: Kristeen Miss   Encounter Date: 02/04/2017      PT End of Session - 02/04/17 1204    Visit Number 6   Number of Visits 17   Date for PT Re-Evaluation 03/04/17   Authorization Type Medicare primary; [employer] insurance secondary; Medicaid tertiary  (G-codes done 6th session)   Authorization Time Period 01/09/17 to 03/11/17   Authorization - Visit Number 6   Authorization - Number of Visits 16   PT Start Time 1115   PT Stop Time 1157   PT Time Calculation (min) 42 min   Activity Tolerance Patient tolerated treatment well   Behavior During Therapy [facility] for tasks assessed/performed      Past Medical History:  Diagnosis Date  . Anxiety    takes alprazolam - rare use   . Arthritis    cervical spondylosis   . Asthma    SEES  PULM W/ [place]    . Balance problem   . Brain cyst   . Depression   . Difficult intubation    Small mouth opening, limited neck flexion, very anterior   . GERD (gastroesophageal reflux disease)    pt. reports that its better, no meds in use at this time- 2015  . Headache   . Herpes   . History of kidney stones   . History of pneumonia   . Hypertension    pt. doesn't see cardiologist, followed for HTN by Dr. Gerarda Fraction  . Inflammation of shoulder joint   . Memory difficulties   . Pneumothorax on right    following C4-6 ACDF 07/04/16  . PONV (postoperative nausea and vomiting)    WITH SURGERY 07/04/16 (NECK) RT LUNG COLLAPSED  . Recurrent falls   . Shortness of breath dyspnea    WITH EXERTION   . Urinary frequency     Past Surgical History:  Procedure Laterality Date  . ABDOMINAL HYSTERECTOMY    . ANTERIOR CERVICAL DECOMP/DISCECTOMY FUSION N/A 08/06/2014    Procedure: ANTERIOR CERVICAL DECOMPRESSION/DISCECTOMY FUSION CERVICAL THREE-FOUR,CERVICAL SIX-SEVEN ,CERVICAL SEVEN-THORACIC ONE;  Surgeon: Floyce Stakes, MD;  Location: [facility];  Service: Neurosurgery;  Laterality: N/A;  . ANTERIOR CERVICAL DECOMP/DISCECTOMY FUSION N/A 07/04/2016   Procedure: CERVICAL FOUR-FIVE, CERVICAL FIVE-SIX ANTERIOR CERVICAL DECOMPRESSION/DISCECTOMY/FUSION WITH REVISION OF CERVICAL THREE-FOUR PLATE;  Surgeon: Leeroy Cha, MD;  Location: [place];  Service: Neurosurgery;  Laterality: N/A;  . CARPAL TUNNEL RELEASE Left 02/16/2013   Procedure: CARPAL TUNNEL RELEASE;  Surgeon: Carole Civil, MD;  Location: [facility];  Service: Orthopedics;  Laterality: Left;  . CARPAL TUNNEL RELEASE Right 03/27/2013   Procedure: RIGHT CARPAL TUNNEL RELEASE;  Surgeon: Carole Civil, MD;  Location: [facility];  Service: Orthopedics;  Laterality: Right;  . CESAREAN SECTION     x2  . CHOLECYSTECTOMY N/A 06/17/2015   Procedure: LAPAROSCOPIC CHOLECYSTECTOMY;  Surgeon: Aviva Signs, MD;  Location: [facility];  Service: General;  Laterality: N/A;  . COLONOSCOPY    . FOREIGN BODY REMOVAL Left    knee-as child  . NASAL SINUS SURGERY N/A 04/13/2015   Procedure: nasal endoscopy with adenoid biopsy;  Surgeon: Ruby Cola, MD;  Location: [place];  Service: ENT;  Laterality: N/A;  . NECK SURGERY  2016  . POSTERIOR CERVICAL FUSION/FORAMINOTOMY N/A 11/13/2016  Procedure: CERVICAL TWO-CERVICAL SIX POSTERIOR CERVICAL FUSION WITH LATERAL MASS FIXATION;  Surgeon: Kristeen Miss, MD;  Location: [facility];  Service: Neurosurgery;  Laterality: N/A;  posterior approach  . ROTATOR CUFF REPAIR Left   . SHOULDER ACROMIOPLASTY Left 05/18/2015   Procedure: SHOULDER ACROMIOPLASTY;  Surgeon: Earlie Server, MD;  Location: [facility];  Service: Orthopedics;  Laterality: Left;  . SHOULDER ARTHROSCOPY WITH DISTAL CLAVICLE RESECTION Left 05/18/2015   Procedure: LEFT SHOULDER ARTHROSCOPY WITH  DISTAL CLAVICLE RESECTION;   Surgeon: Earlie Server, MD;  Location: [facility];  Service: Orthopedics;  Laterality: Left;    There were no vitals filed for this visit.      Subjective Assessment - 02/04/17 1117    Subjective Patient arrives stating that she does not feel like she is getting much better, pain seems to dominate her life and keeps her from doing quite a bit. She feels like her head and neck are stopping her from doing quite a bit. She does feel like she is getting a little bit better but things are just not even close to where she'd like to be overall.    Pertinent History C2-C6 posterior fusion 10/2016, HTN, asthma, hx of carpal tunnel (B but resolved), history of multiple rotator cuff surgeries (resolved), history of impingement syndrome, history of cervical spinal stenosis, anxiety    How long can you sit comfortably? 7/9- 5 minutes   How long can you stand comfortably? 7/9- immediate discomfort    How long can you walk comfortably? 7/9- uncomfortable but not limiting    Patient Stated Goals reduce pain, just move better in general    Currently in Pain? Yes   Pain Score 6    Pain Location Other (Comment)  neck and head    Pain Orientation Posterior;Right;Left   Pain Descriptors / Indicators Burning;Throbbing;Tingling;Sharp   Pain Type Chronic pain   Pain Radiating Towards down B UEs to hands    Pain Onset More than a month ago   Pain Frequency Constant   Aggravating Factors  certain movements    Pain Relieving Factors muscle relaxers, laying down    Effect of Pain on Daily Activities severe limitations             [facility] PT Assessment - 02/04/17 0001      AROM   Right Shoulder Internal Rotation --  T11    Right Shoulder External Rotation --  T3   Left Shoulder Internal Rotation --  T11   Left Shoulder External Rotation --  T3   Cervical Flexion 18   Cervical Extension 4   Cervical - Right Side Bend 15   Cervical - Left Side Bend 16   Cervical - Right Rotation 25    Cervical - Left Rotation 25   Thoracic Flexion severe limitaiton    Thoracic Extension severe limitation    Thoracic - Right Side Bend mild limitation    Thoracic - Left Side Bend mild limitation    Thoracic - Right Rotation moderate limitation    Thoracic - Left Rotation mild limitation      Strength   Overall Strength Comments slight grip deficit L per manual testing    Right Shoulder Flexion 4/5   Right Shoulder ABduction 3+/5   Right Shoulder Internal Rotation 5/5   Right Shoulder External Rotation 4-/5   Left Shoulder Flexion 5/5   Left Shoulder ABduction 5/5   Left Shoulder Internal Rotation 5/5   Left Shoulder External Rotation 5/5  Right Elbow Flexion 5/5   Right Elbow Extension 3+/5   Left Elbow Flexion 5/5   Left Elbow Extension 4/5     Palpation   Palpation comment significant muscle nkotting noted however much improved since initial eval                      [facility] Adult PT Treatment/Exercise - 02/04/17 0001      Neck Exercises: Seated   Other Seated Exercise seated neck rotation with 5 second holds x10 each side      Manual Therapy   Manual Therapy Soft tissue mobilization;Myofascial release   Manual therapy comments Manual therapy completed seperately to all other interventions this date   Soft tissue mobilization B traps, levators, rhomboids    Myofascial Release R levator scap, L upper trap                 PT Education - 02/04/17 1203    Education provided Yes   Education Details progrses towards goals, general dry needling education, POC moving forward incuding dry needling trial next session    Person(s) Educated Patient   Methods Explanation   Comprehension Verbalized understanding          PT Short Term Goals - 02/04/17 1131      PT SHORT TERM GOAL #1   Title Patient to improve cervical ROM by at least 20 degrees on all planes in order to reduce pain and improve QOL    Baseline 7/9- some improvement but has not met goal     Time 4   Period Weeks   Status On-going     PT SHORT TERM GOAL #2   Title Patient to demonstrate thoracic ROM as being [facility] on all planes in order to reduce pain and improve QOL    Baseline 7/9- improving but has not met goal yet    Time 4   Period Weeks   Status On-going     PT SHORT TERM GOAL #3   Title Patient to improve functional B shoulder ER and IR by at least 3 vertebral levels in order to show improved mobility and to improve functional task performance    Baseline 7/9- mobility has improved but she still has some problems with dressing    Time 4   Period Weeks   Status Partially Met     PT SHORT TERM GOAL #4   Title Patient to experience radicular symptoms no further than proximal 20% of her L humerus in order to show general symptoms improvement    Baseline 7/9- down to hands B    Time 4   Period Weeks   Status On-going     PT SHORT TERM GOAL #5   Title Patient to be compliant with HEP, to be updated PRN    Baseline 7/9- compliant    Time 1   Period Weeks   Status Achieved           PT Long Term Goals - 02/04/17 1133      PT LONG TERM GOAL #1   Title Patient to be able to maintain correct posture at least 70% of the time without cues or increased pain in order to improve general mechanics    Baseline 7/9- more aware, is trying her best    Time 8   Period Weeks   Status Partially Met     PT LONG TERM GOAL #2   Title Patient to demonstrate cervical ROM as being  no more than 20 degrees of normal on all planes in order to reduce pain and improve functional task tolerance    Baseline Feb 16, 2023- ongoing    Time 8   Period Weeks   Status On-going     PT LONG TERM GOAL #3   Title Patient to experience cervical pain no more than 2/10 with all functional tasks and activities in order to improve QOL and functional task tolerance    Baseline 02-16-23- pain has remained relatively high but has been dropped to 0/10 with STM    Time 8   Period Weeks   Status On-going      PT LONG TERM GOAL #4   Title Patient to be able to complete all functional self-care activiteis, including washing her hair and dressing, with no difficulty and pain no more than 2/10 in order to improve functional task tolerance    Baseline 02-16-23- some difficulty with overhead dressing, pain remains relatively high    Time 8   Period Weeks   Status On-going     PT LONG TERM GOAL #5   Title Patient to experience no radicular symptoms L UE in order to show general improvement of condition    Baseline Feb 16, 2023- radicular symptoms down to B hands    Time 8   Period Weeks   Status On-going               Plan - 15-Feb-2017 1204    Clinical Impression Statement Re-assessment performed today. Patient some mild gains in cervical/thoracic ROM as well as in extent and severity of muscle knotting noted today; she continues to remain motivated to participate with skilled PT services as a whole however is frustrated by her pain and its impact on her life. Patient has responded fairly well with soft tissue related interventions, and POC includes trial of dry needling next session. Recommend continuation of skilled PT services to continue addressing functional impairment, reduce pain, and assist in return to optimal level of function.    Rehab Potential Fair   Clinical Impairments Affecting Rehab Potential (+) motivated to participate with PT; (-) medical and surgical history, chronicity of pain    PT Frequency 2x / week   PT Duration 4 weeks   PT Treatment/Interventions ADLs/Self Care Home Management;Biofeedback;Cryotherapy;Electrical Stimulation;Moist Heat;Ultrasound;Functional mobility training;Therapeutic activities;Therapeutic exercise;Balance training;Neuromuscular re-education;Patient/family education;Manual techniques;Scar mobilization;Passive range of motion;Dry needling;Taping   PT Next Visit Plan trial of dry needling. Discuss possible meditation/breathing strategies with patient to attempt to  address pain.    PT Home Exercise Plan Eval: lumbar roll for posture, cervical and thoracic excursions; 6/29: upper trap, levator, SCM stretches    Consulted and Agree with Plan of Care Patient      Patient will benefit from skilled therapeutic intervention in order to improve the following deficits and impairments:  Increased fascial restricitons, Improper body mechanics, Pain, Increased muscle spasms, Postural dysfunction, Decreased range of motion, Decreased strength, Hypomobility, Impaired UE functional use, Impaired flexibility  Visit Diagnosis: Cervicalgia  Abnormal posture  Cramp and spasm  Stiffness of right shoulder, not elsewhere classified  Stiffness of left shoulder, not elsewhere classified       G-Codes - 02/15/2017 1206    Functional Assessment Tool Used (Outpatient Only) Based on skilled clincial assessment of ROM, muscle spasm, posture, strength, functional task tolerance    Functional Limitation Mobility: Walking and moving around   Mobility: Walking and Moving Around Current Status ([id number]) At least 60 percent but less than 80 percent impaired, limited  or restricted   Mobility: Walking and Moving Around Goal Status [phone]) At least 40 percent but less than 60 percent impaired, limited or restricted      Problem List Patient Active Problem List   Diagnosis Date Noted  . Pseudoarthrosis of cervical spine ([place]) 11/13/2016  . Asthma 07/05/2016  . Acute chest wall pain 07/05/2016  . GERD (gastroesophageal reflux disease) 07/05/2016  . Spontaneous pneumothorax   . Cervical stenosis of spinal canal 08/06/2014  . Muscle weakness (generalized) 11/12/2013  . Tight fascia 11/12/2013  . Decreased range of motion of shoulder 11/12/2013  . Cervical spondylosis without myelopathy 10/29/2013  . S/P carpal tunnel release 02/19/2013  . CTS (carpal tunnel syndrome) 02/19/2013  . SHOULDER PAIN 10/06/2007  . IMPINGEMENT SYNDROME 10/06/2007  . RUPTURE ROTATOR CUFF 10/06/2007   . HTN (hypertension) 10/03/2007    Deniece Ree PT, DPT [facility] [address] [place], [place], [address] Phone: [phone]   Fax:  [phone]  Name: Kimberly [name] MRN: [id number] Date of Birth: Mar 23, 1961
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
## 2017-02-05 ENCOUNTER — Emergency Department (HOSPITAL_COMMUNITY): Payer: Medicare Other

## 2017-02-05 ENCOUNTER — Encounter (HOSPITAL_COMMUNITY): Payer: Self-pay

## 2017-02-05 ENCOUNTER — Emergency Department (HOSPITAL_COMMUNITY)
Admission: EM | Admit: 2017-02-05 | Discharge: 2017-02-05 | Disposition: A | Discharge status: Home / Self Care | Payer: Medicare Other | Attending: Emergency Medicine | Admitting: Emergency Medicine

## 2017-02-05 ENCOUNTER — Encounter (HOSPITAL_COMMUNITY): Payer: Medicare Other

## 2017-02-05 DIAGNOSIS — I10 Essential (primary) hypertension: Secondary | ICD-10-CM | POA: Diagnosis not present

## 2017-02-05 DIAGNOSIS — S2232XA Fracture of one rib, left side, initial encounter for closed fracture: Secondary | ICD-10-CM | POA: Insufficient documentation

## 2017-02-05 DIAGNOSIS — Z7982 Long term (current) use of aspirin: Secondary | ICD-10-CM | POA: Insufficient documentation

## 2017-02-05 DIAGNOSIS — Z9049 Acquired absence of other specified parts of digestive tract: Secondary | ICD-10-CM | POA: Insufficient documentation

## 2017-02-05 DIAGNOSIS — Z79899 Other long term (current) drug therapy: Secondary | ICD-10-CM | POA: Diagnosis not present

## 2017-02-05 DIAGNOSIS — W228XXA Striking against or struck by other objects, initial encounter: Secondary | ICD-10-CM | POA: Diagnosis not present

## 2017-02-05 DIAGNOSIS — Y998 Other external cause status: Secondary | ICD-10-CM | POA: Diagnosis not present

## 2017-02-05 DIAGNOSIS — Y92018 Other place in single-family (private) house as the place of occurrence of the external cause: Secondary | ICD-10-CM | POA: Diagnosis not present

## 2017-02-05 DIAGNOSIS — S20302A Unspecified superficial injuries of left front wall of thorax, initial encounter: Secondary | ICD-10-CM | POA: Diagnosis present

## 2017-02-05 DIAGNOSIS — F419 Anxiety disorder, unspecified: Secondary | ICD-10-CM | POA: Diagnosis not present

## 2017-02-05 DIAGNOSIS — Z91010 Allergy to peanuts: Secondary | ICD-10-CM | POA: Diagnosis not present

## 2017-02-05 DIAGNOSIS — F329 Major depressive disorder, single episode, unspecified: Secondary | ICD-10-CM | POA: Insufficient documentation

## 2017-02-05 DIAGNOSIS — J45909 Unspecified asthma, uncomplicated: Secondary | ICD-10-CM | POA: Diagnosis not present

## 2017-02-05 DIAGNOSIS — Y9389 Activity, other specified: Secondary | ICD-10-CM | POA: Insufficient documentation

## 2017-02-05 DIAGNOSIS — R079 Chest pain, unspecified: Secondary | ICD-10-CM | POA: Diagnosis not present

## 2017-02-05 MED ORDER — OXYCODONE-ACETAMINOPHEN 5-325 MG PO TABS
ORAL_TABLET | ORAL | Status: AC
  Filled 2017-02-05: qty 1

## 2017-02-05 MED ORDER — OXYCODONE-ACETAMINOPHEN 5-325 MG PO TABS: 1.0000 | ORAL_TABLET | ORAL | 0 refills | Status: DC | PRN
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
## 2017-02-05 NOTE — Discharge Instructions (Signed)
[place]ation regarding her condition. Follow-up with PCP for further evaluation. Take medications as directed. Return to ED for worsening pain, increased chest pain, trouble breathing, additional injury or loss of consciousness.
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
## 2017-02-06 ENCOUNTER — Telehealth (HOSPITAL_COMMUNITY): Payer: Self-pay | Admitting: Internal Medicine
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
## 2017-02-07 ENCOUNTER — Telehealth (HOSPITAL_COMMUNITY): Payer: Self-pay | Admitting: Internal Medicine

## 2017-02-07 ENCOUNTER — Ambulatory Visit (HOSPITAL_COMMUNITY): Payer: Medicare Other | Admitting: Physical Therapy
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
## 2017-02-08 ENCOUNTER — Telehealth (HOSPITAL_COMMUNITY): Payer: Self-pay | Admitting: Internal Medicine
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
## 2017-02-11 ENCOUNTER — Ambulatory Visit (HOSPITAL_COMMUNITY): Payer: Medicare Other

## 2017-02-11 DIAGNOSIS — S2232XA Fracture of one rib, left side, initial encounter for closed fracture: Secondary | ICD-10-CM | POA: Diagnosis not present

## 2017-02-11 DIAGNOSIS — T07XXXA Unspecified multiple injuries, initial encounter: Secondary | ICD-10-CM | POA: Diagnosis not present

## 2017-02-11 DIAGNOSIS — F419 Anxiety disorder, unspecified: Secondary | ICD-10-CM | POA: Diagnosis not present

## 2017-02-11 DIAGNOSIS — Z6826 Body mass index (BMI) 26.0-26.9, adult: Secondary | ICD-10-CM | POA: Diagnosis not present

## 2017-02-11 DIAGNOSIS — F330 Major depressive disorder, recurrent, mild: Secondary | ICD-10-CM | POA: Diagnosis not present

## 2017-02-11 DIAGNOSIS — E663 Overweight: Secondary | ICD-10-CM | POA: Diagnosis not present

## 2017-02-11 DIAGNOSIS — I10 Essential (primary) hypertension: Secondary | ICD-10-CM | POA: Diagnosis not present

## 2017-02-11 DIAGNOSIS — Z1389 Encounter for screening for other disorder: Secondary | ICD-10-CM | POA: Diagnosis not present

## 2017-02-12 ENCOUNTER — Encounter (HOSPITAL_COMMUNITY): Payer: Medicare Other

## 2017-02-14 ENCOUNTER — Ambulatory Visit (HOSPITAL_COMMUNITY): Payer: Medicare Other | Admitting: Physical Therapy

## 2017-02-14 DIAGNOSIS — F439 Reaction to severe stress, unspecified: Secondary | ICD-10-CM | POA: Insufficient documentation

## 2017-02-17 ENCOUNTER — Encounter (HOSPITAL_COMMUNITY): Payer: Self-pay | Admitting: Emergency Medicine

## 2017-02-17 ENCOUNTER — Emergency Department (HOSPITAL_COMMUNITY)
Admission: EM | Admit: 2017-02-17 | Discharge: 2017-02-17 | Disposition: A | Discharge status: Home / Self Care | Payer: Medicare Other | Attending: Emergency Medicine | Admitting: Emergency Medicine

## 2017-02-17 ENCOUNTER — Emergency Department (HOSPITAL_COMMUNITY): Payer: Medicare Other

## 2017-02-17 DIAGNOSIS — Z9049 Acquired absence of other specified parts of digestive tract: Secondary | ICD-10-CM | POA: Insufficient documentation

## 2017-02-17 DIAGNOSIS — F329 Major depressive disorder, single episode, unspecified: Secondary | ICD-10-CM | POA: Insufficient documentation

## 2017-02-17 DIAGNOSIS — J45909 Unspecified asthma, uncomplicated: Secondary | ICD-10-CM | POA: Diagnosis not present

## 2017-02-17 DIAGNOSIS — Z79899 Other long term (current) drug therapy: Secondary | ICD-10-CM | POA: Insufficient documentation

## 2017-02-17 DIAGNOSIS — I10 Essential (primary) hypertension: Secondary | ICD-10-CM | POA: Diagnosis not present

## 2017-02-17 DIAGNOSIS — F419 Anxiety disorder, unspecified: Secondary | ICD-10-CM | POA: Insufficient documentation

## 2017-02-17 DIAGNOSIS — R072 Precordial pain: Secondary | ICD-10-CM | POA: Insufficient documentation

## 2017-02-17 DIAGNOSIS — R079 Chest pain, unspecified: Secondary | ICD-10-CM | POA: Diagnosis present

## 2017-02-17 DIAGNOSIS — Z91010 Allergy to peanuts: Secondary | ICD-10-CM | POA: Insufficient documentation

## 2017-02-17 DIAGNOSIS — R918 Other nonspecific abnormal finding of lung field: Secondary | ICD-10-CM | POA: Diagnosis not present

## 2017-02-17 LAB — I-STAT TROPONIN, ED: TROPONIN I, POC: 0 ng/mL (ref 0.00–0.08)

## 2017-02-17 LAB — BASIC METABOLIC PANEL
ANION GAP: 10 (ref 5–15)
BUN: <5 mg/dL — ABNORMAL LOW (ref 6–20)
CALCIUM: 9.4 mg/dL (ref 8.9–10.3)
CHLORIDE: 105 mmol/L (ref 101–111)
CO2: 26 mmol/L (ref 22–32)
Creatinine, Ser: 0.78 mg/dL (ref 0.44–1.00)
GFR calc Af Amer: >60 mL/min (ref >60)
GFR calc non Af Amer: >60 mL/min (ref >60)
GLUCOSE: 101 mg/dL — AB (ref 65–99)
Potassium: 3.8 mmol/L (ref 3.5–5.1)
Sodium: 141 mmol/L (ref 135–145)

## 2017-02-17 LAB — CBC
HEMATOCRIT: 37.8 % (ref 36.0–46.0)
HEMOGLOBIN: 12.5 g/dL (ref 12.0–15.0)
MCH: 30.3 pg (ref 26.0–34.0)
MCHC: 33.1 g/dL (ref 30.0–36.0)
MCV: 91.5 fL (ref 78.0–100.0)
Platelets: 304 10*3/uL (ref 150–400)
RBC: 4.13 MIL/uL (ref 3.87–5.11)
RDW: 12.7 % (ref 11.5–15.5)
WBC: 8.2 10*3/uL (ref 4.0–10.5)
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
## 2017-02-17 NOTE — ED Notes (Signed)
Patient transported to X-ray 

## 2017-02-18 ENCOUNTER — Ambulatory Visit (HOSPITAL_COMMUNITY): Payer: Medicare Other | Admitting: Physical Therapy

## 2017-02-19 ENCOUNTER — Encounter (HOSPITAL_COMMUNITY): Payer: Medicare Other | Admitting: Physical Therapy

## 2017-02-21 ENCOUNTER — Ambulatory Visit (HOSPITAL_COMMUNITY): Payer: Medicare Other | Admitting: Physical Therapy

## 2017-02-22 DIAGNOSIS — F439 Reaction to severe stress, unspecified: Secondary | ICD-10-CM | POA: Diagnosis not present

## 2017-02-24 ENCOUNTER — Emergency Department (HOSPITAL_COMMUNITY)
Admission: EM | Admit: 2017-02-24 | Discharge: 2017-02-24 | Disposition: A | Discharge status: Home / Self Care | Payer: Medicare Other | Attending: Emergency Medicine | Admitting: Emergency Medicine

## 2017-02-24 ENCOUNTER — Emergency Department (HOSPITAL_COMMUNITY): Payer: Medicare Other

## 2017-02-24 ENCOUNTER — Encounter (HOSPITAL_COMMUNITY): Payer: Self-pay | Admitting: Emergency Medicine

## 2017-02-24 DIAGNOSIS — F0781 Postconcussional syndrome: Secondary | ICD-10-CM | POA: Insufficient documentation

## 2017-02-24 DIAGNOSIS — Z79899 Other long term (current) drug therapy: Secondary | ICD-10-CM | POA: Diagnosis not present

## 2017-02-24 DIAGNOSIS — J45909 Unspecified asthma, uncomplicated: Secondary | ICD-10-CM | POA: Diagnosis not present

## 2017-02-24 DIAGNOSIS — Z91010 Allergy to peanuts: Secondary | ICD-10-CM | POA: Diagnosis not present

## 2017-02-24 DIAGNOSIS — I10 Essential (primary) hypertension: Secondary | ICD-10-CM | POA: Diagnosis not present

## 2017-02-24 DIAGNOSIS — H8190 Unspecified disorder of vestibular function, unspecified ear: Secondary | ICD-10-CM | POA: Diagnosis not present

## 2017-02-24 DIAGNOSIS — R42 Dizziness and giddiness: Secondary | ICD-10-CM | POA: Diagnosis not present

## 2017-02-24 DIAGNOSIS — S0990XA Unspecified injury of head, initial encounter: Secondary | ICD-10-CM | POA: Diagnosis not present

## 2017-02-24 MED ORDER — MECLIZINE HCL 25 MG PO TABS
25.0000 mg | ORAL_TABLET | Freq: Three times a day (TID) | ORAL | 0 refills | Status: DC | PRN
Start: 2017-02-24 — End: 2018-04-17
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
## 2017-02-24 NOTE — Discharge Instructions (Signed)
Contact a health care provider if: You have increased problems paying attention or concentrating. You have increased difficulty remembering or learning new information. You need more time to complete tasks or assignments than before. You have increased irritability or decreased ability to cope with stress. You have more symptoms than before. Seek medical care if you have any of the following symptoms for more than two weeks after your injury: Lasting (chronic) headaches. Dizziness or balance problems. Nausea. Vision problems. Increased sensitivity to noise or light. Depression or mood swings. Anxiety or irritability. Memory problems. Difficulty concentrating or paying attention. Sleep problems. Feeling tired all the time.  Get help right away if: You have confusion or unusual drowsiness. Others find it difficult to wake you up. You have nausea or persistent, forceful vomiting. You feel like you are moving when you are not (vertigo). Your eyes may move rapidly back and forth. You have convulsions or faint. You have severe, persistent headaches that are not relieved by medicine. You cannot use your arms or legs normally. One of your pupils is larger than the other. You have clear or bloody discharge from your nose or ears. Your problems are getting worse, not better.
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
## 2017-02-24 NOTE — ED Notes (Signed)
Pt. In mRI
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
## 2017-02-25 ENCOUNTER — Encounter (HOSPITAL_COMMUNITY): Payer: Medicare Other | Admitting: Physical Therapy

## 2017-02-26 ENCOUNTER — Encounter (HOSPITAL_COMMUNITY): Payer: Medicare Other

## 2017-02-26 IMAGING — CR DG SHOULDER 2+V*R*
5 series · 5 of 5 positions shown · non-contrast
Comparison: None.

CLINICAL DATA: Pain following fall 3 days prior

EXAM:
RIGHT SHOULDER - 2+ VIEW

[view not recorded (1 of 5)]
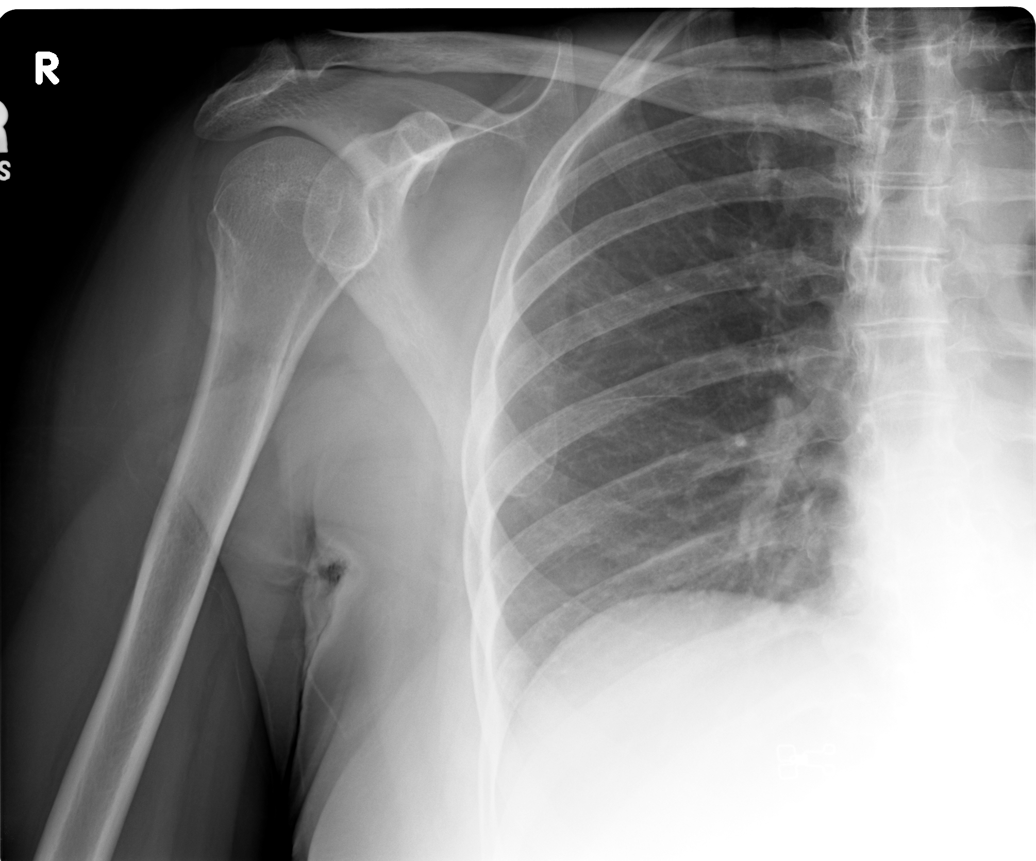

[view not recorded (2 of 5)]
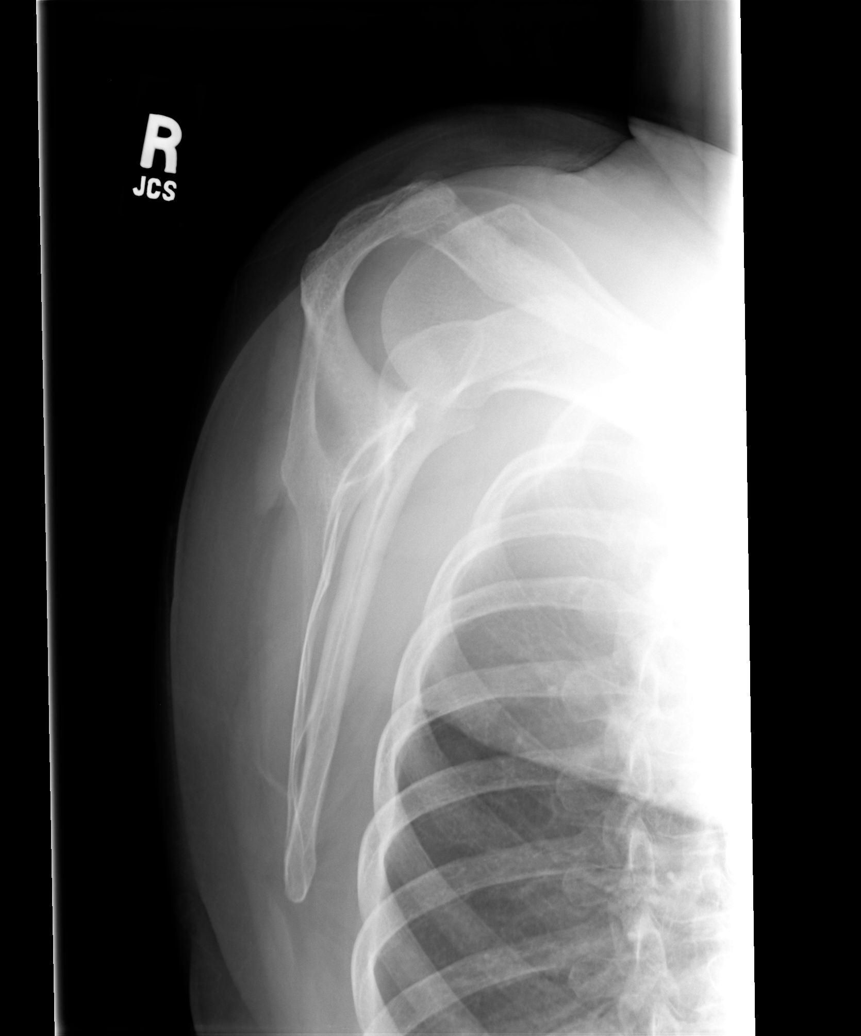

[view not recorded (3 of 5)]
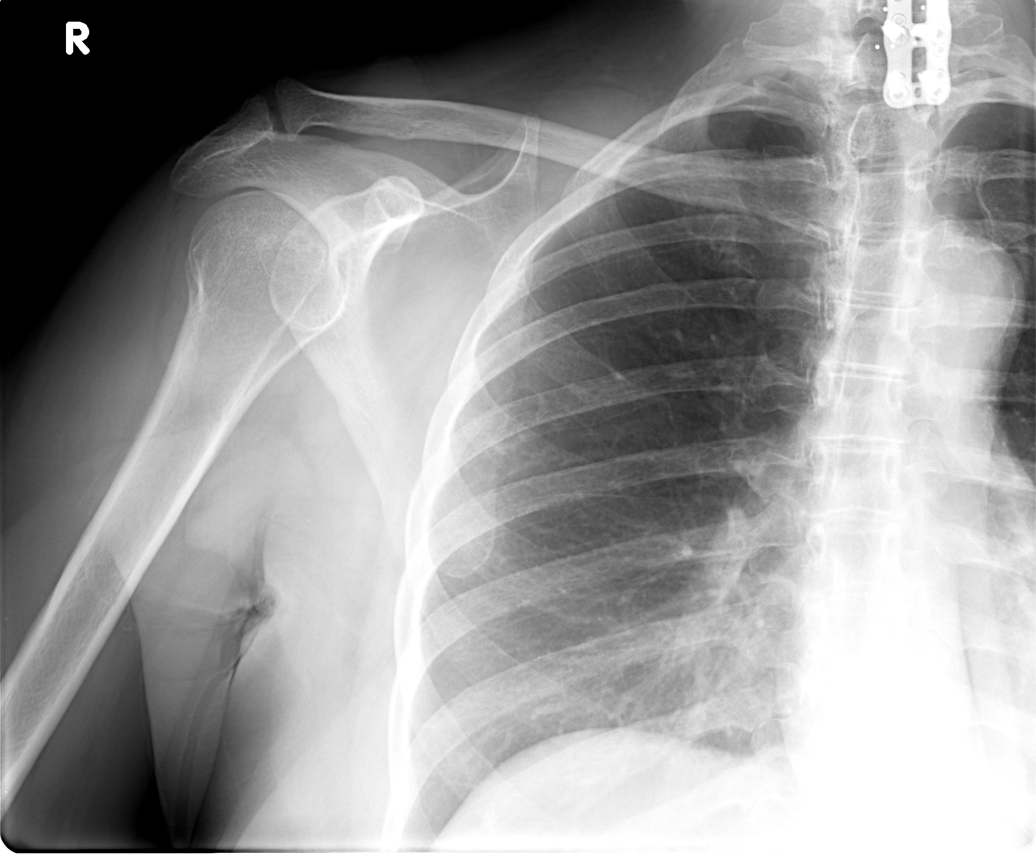

[view not recorded (4 of 5)]
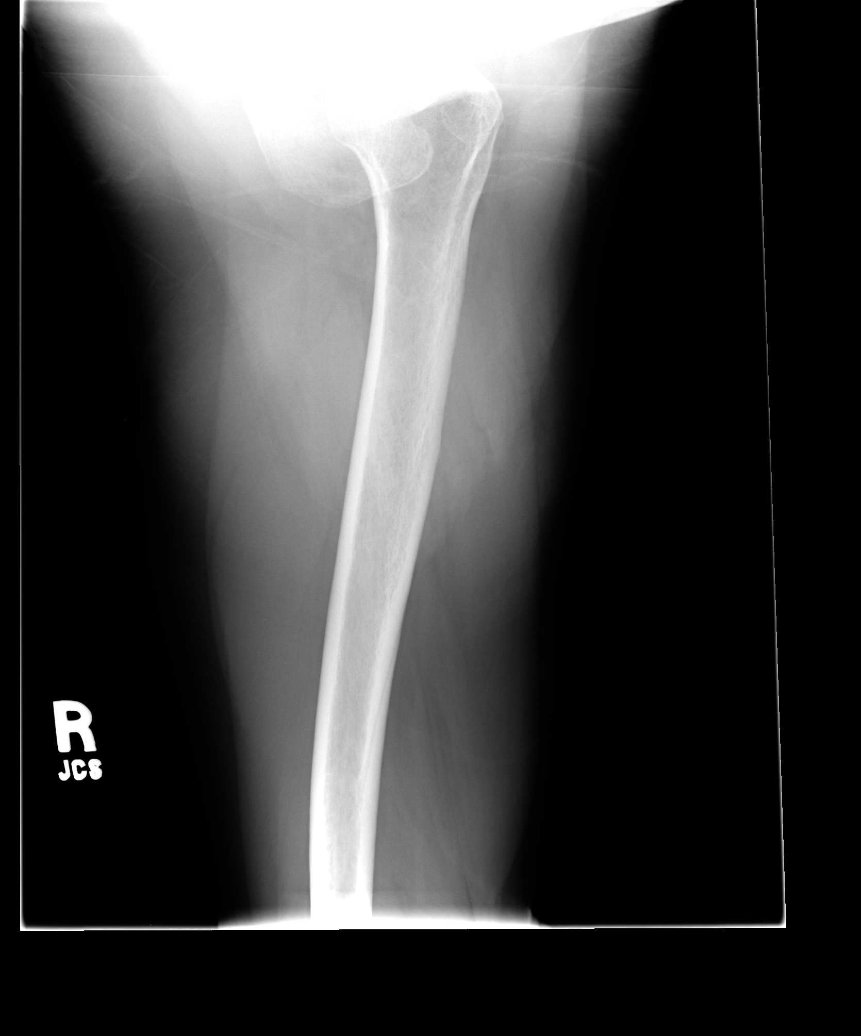

[view not recorded (5 of 5)]
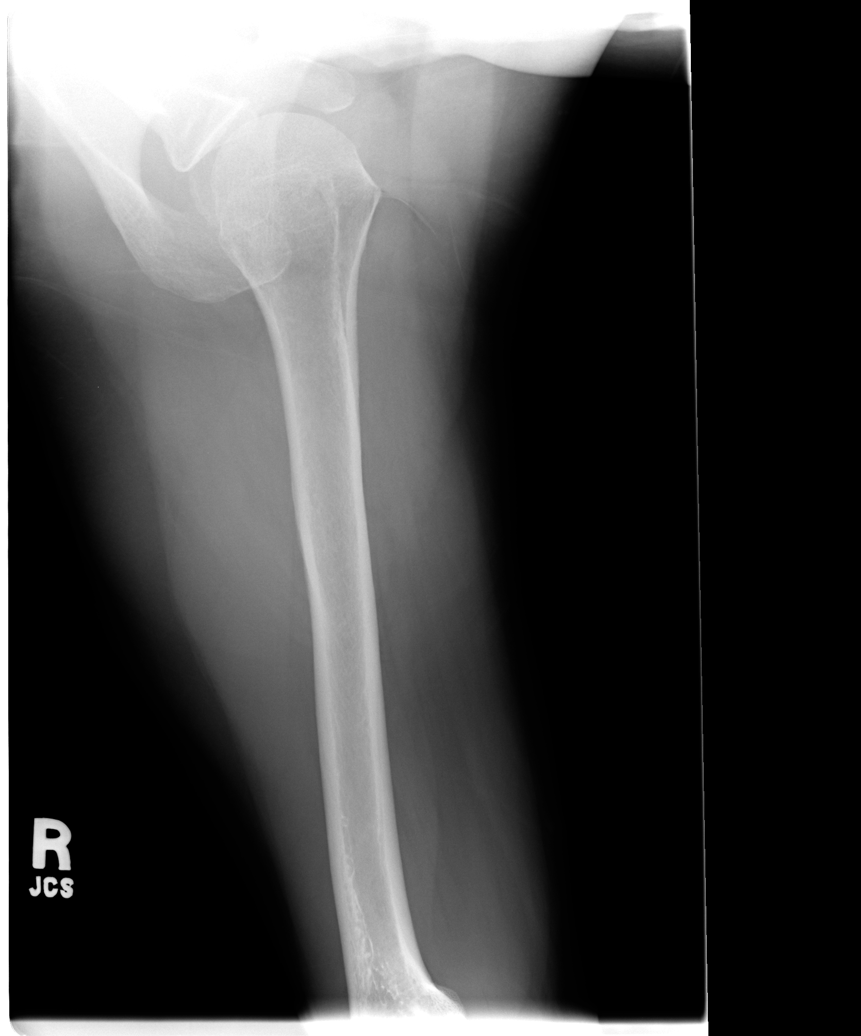

[5 of 5 positions shown; findings below may reference images not displayed]

FINDINGS: Frontal, Y scapular, and axillary images were obtained. There is
mild generalized osteoarthritic change. Note that there is bony
overgrowth along the inferior acromion and distal clavicle. No
fracture or dislocation. No erosive change or intra-articular
calcification. Postoperative change is noted in the lower cervical
region.
IMPRESSION: Osteoarthritic change. There is bony overgrowth along the inferior
distal clavicle and acromion. Bony overgrowth of this nature may
lead to so-called impingement syndrome. MR is the imaging study of
choice if impingement syndrome is suspected clinically. No fracture
or dislocation.

## 2017-02-28 ENCOUNTER — Encounter (HOSPITAL_COMMUNITY): Payer: Medicare Other | Admitting: Physical Therapy

## 2017-03-01 ENCOUNTER — Telehealth (HOSPITAL_COMMUNITY): Payer: Self-pay | Admitting: Internal Medicine

## 2017-03-01 NOTE — Telephone Encounter (Signed)
03/01/17  Pt cx because she said she was moving in to an aparmtnet on Monday and we RS for 8/8

## 2017-03-04 ENCOUNTER — Ambulatory Visit (HOSPITAL_COMMUNITY): Payer: Medicare Other | Admitting: Physical Therapy

## 2017-03-04 ENCOUNTER — Encounter (HOSPITAL_COMMUNITY): Payer: Medicare Other

## 2017-03-05 ENCOUNTER — Encounter (HOSPITAL_COMMUNITY): Payer: Medicare Other

## 2017-03-06 ENCOUNTER — Ambulatory Visit (HOSPITAL_COMMUNITY): Payer: Medicare Other | Attending: Neurological Surgery | Admitting: Physical Therapy

## 2017-03-06 DIAGNOSIS — R293 Abnormal posture: Secondary | ICD-10-CM

## 2017-03-06 DIAGNOSIS — R252 Cramp and spasm: Secondary | ICD-10-CM | POA: Insufficient documentation

## 2017-03-06 DIAGNOSIS — M542 Cervicalgia: Secondary | ICD-10-CM | POA: Diagnosis not present

## 2017-03-06 DIAGNOSIS — M25611 Stiffness of right shoulder, not elsewhere classified: Secondary | ICD-10-CM

## 2017-03-06 DIAGNOSIS — M25612 Stiffness of left shoulder, not elsewhere classified: Secondary | ICD-10-CM | POA: Insufficient documentation
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
## 2017-03-07 ENCOUNTER — Encounter (HOSPITAL_COMMUNITY): Payer: Medicare Other | Admitting: Physical Therapy

## 2017-03-08 ENCOUNTER — Encounter (HOSPITAL_COMMUNITY): Payer: Self-pay | Admitting: *Deleted

## 2017-03-08 ENCOUNTER — Emergency Department (HOSPITAL_COMMUNITY)
Admission: EM | Admit: 2017-03-08 | Discharge: 2017-03-08 | Disposition: A | Discharge status: Home / Self Care | Payer: Medicare Other | Attending: Emergency Medicine | Admitting: Emergency Medicine

## 2017-03-08 DIAGNOSIS — F329 Major depressive disorder, single episode, unspecified: Secondary | ICD-10-CM | POA: Insufficient documentation

## 2017-03-08 DIAGNOSIS — F419 Anxiety disorder, unspecified: Secondary | ICD-10-CM | POA: Diagnosis not present

## 2017-03-08 DIAGNOSIS — I10 Essential (primary) hypertension: Secondary | ICD-10-CM | POA: Diagnosis not present

## 2017-03-08 DIAGNOSIS — Z91010 Allergy to peanuts: Secondary | ICD-10-CM | POA: Diagnosis not present

## 2017-03-08 DIAGNOSIS — Z9049 Acquired absence of other specified parts of digestive tract: Secondary | ICD-10-CM | POA: Insufficient documentation

## 2017-03-08 DIAGNOSIS — J45909 Unspecified asthma, uncomplicated: Secondary | ICD-10-CM | POA: Diagnosis not present

## 2017-03-08 DIAGNOSIS — R51 Headache: Secondary | ICD-10-CM | POA: Diagnosis not present

## 2017-03-08 DIAGNOSIS — M542 Cervicalgia: Secondary | ICD-10-CM | POA: Insufficient documentation

## 2017-03-08 MED ORDER — OXYCODONE-ACETAMINOPHEN 5-325 MG PO TABS
1.0000 | ORAL_TABLET | Freq: Once | ORAL | Status: AC
Start: 2017-03-08 — End: 2017-03-08
  Administered 2017-03-08: 1 via ORAL
  Filled 2017-03-08: qty 1

## 2017-03-08 MED ORDER — IBUPROFEN 600 MG PO TABS: 600.0000 mg | ORAL_TABLET | Freq: Three times a day (TID) | ORAL | 0 refills | Status: DC | PRN
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
## 2017-03-08 NOTE — Discharge Instructions (Addendum)
You were seen in the emergency department for neck pain. Please call to schedule a follow up appointment with your primary care doctor in 3-5 days. Continue to take medications as previously prescribed and directed. Continue with physical therapy as well to help with your symptoms. Please take ibuprofen (anti-inflammatory) as prescribed as needed for mild to moderate pain--take with food to prevent GI upset. Apply heat such as a warm washcloth to your neck for 20 minutes four times a day. Please return to the ED if you develop fevers, chills, unexplained weight loss, dizziness, vision or gait changes, sudden or severe headaches, chest pain, shortness of breath, abdominal pain, nausea/vomiting/diarrhea, blood in urine or stool, bladder or bowel dysfunction, numbness or tingling to groin, extremity numbness/tingling/weakness, worsening symptoms, or any additional concerns.

## 2017-03-12 DIAGNOSIS — L089 Local infection of the skin and subcutaneous tissue, unspecified: Secondary | ICD-10-CM | POA: Diagnosis not present

## 2017-03-12 DIAGNOSIS — L729 Follicular cyst of the skin and subcutaneous tissue, unspecified: Secondary | ICD-10-CM | POA: Diagnosis not present

## 2017-03-14 ENCOUNTER — Ambulatory Visit: Payer: Medicare Other | Attending: Neurological Surgery | Admitting: Physical Therapy

## 2017-03-14 ENCOUNTER — Encounter: Payer: Self-pay | Admitting: Physical Therapy

## 2017-03-14 DIAGNOSIS — M25612 Stiffness of left shoulder, not elsewhere classified: Secondary | ICD-10-CM | POA: Diagnosis not present

## 2017-03-14 DIAGNOSIS — M25611 Stiffness of right shoulder, not elsewhere classified: Secondary | ICD-10-CM | POA: Insufficient documentation

## 2017-03-14 DIAGNOSIS — R252 Cramp and spasm: Secondary | ICD-10-CM | POA: Insufficient documentation

## 2017-03-14 DIAGNOSIS — M542 Cervicalgia: Secondary | ICD-10-CM

## 2017-03-14 DIAGNOSIS — R293 Abnormal posture: Secondary | ICD-10-CM | POA: Insufficient documentation
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
## 2017-03-15 DIAGNOSIS — F439 Reaction to severe stress, unspecified: Secondary | ICD-10-CM | POA: Diagnosis not present

## 2017-03-18 ENCOUNTER — Ambulatory Visit: Payer: Medicare Other | Admitting: Physical Therapy

## 2017-03-18 DIAGNOSIS — M542 Cervicalgia: Secondary | ICD-10-CM

## 2017-03-18 DIAGNOSIS — R252 Cramp and spasm: Secondary | ICD-10-CM | POA: Diagnosis not present

## 2017-03-18 DIAGNOSIS — M25612 Stiffness of left shoulder, not elsewhere classified: Secondary | ICD-10-CM | POA: Diagnosis not present

## 2017-03-18 DIAGNOSIS — R293 Abnormal posture: Secondary | ICD-10-CM | POA: Diagnosis not present

## 2017-03-18 DIAGNOSIS — M25611 Stiffness of right shoulder, not elsewhere classified: Secondary | ICD-10-CM | POA: Diagnosis not present

## 2017-03-18 NOTE — Patient Instructions (Signed)
Grip strength R 32 18 25  L 20 20 20   CPAs grade I  Soft tissue mobilization   Assessed cervical rotations

## 2017-03-19 DIAGNOSIS — E663 Overweight: Secondary | ICD-10-CM | POA: Diagnosis not present

## 2017-03-19 DIAGNOSIS — Z6825 Body mass index (BMI) 25.0-25.9, adult: Secondary | ICD-10-CM | POA: Diagnosis not present

## 2017-03-19 DIAGNOSIS — L729 Follicular cyst of the skin and subcutaneous tissue, unspecified: Secondary | ICD-10-CM | POA: Diagnosis not present

## 2017-03-19 DIAGNOSIS — R229 Localized swelling, mass and lump, unspecified: Secondary | ICD-10-CM | POA: Diagnosis not present
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
## 2017-03-21 ENCOUNTER — Encounter: Payer: Self-pay | Admitting: Physical Therapy

## 2017-03-21 ENCOUNTER — Ambulatory Visit: Payer: Medicare Other | Admitting: Physical Therapy

## 2017-03-21 DIAGNOSIS — M25611 Stiffness of right shoulder, not elsewhere classified: Secondary | ICD-10-CM | POA: Diagnosis not present

## 2017-03-21 DIAGNOSIS — R252 Cramp and spasm: Secondary | ICD-10-CM | POA: Diagnosis not present

## 2017-03-21 DIAGNOSIS — M542 Cervicalgia: Secondary | ICD-10-CM | POA: Diagnosis not present

## 2017-03-21 DIAGNOSIS — M25612 Stiffness of left shoulder, not elsewhere classified: Secondary | ICD-10-CM

## 2017-03-21 DIAGNOSIS — R293 Abnormal posture: Secondary | ICD-10-CM | POA: Diagnosis not present
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
## 2017-03-25 ENCOUNTER — Ambulatory Visit: Payer: Medicare Other | Admitting: Physical Therapy

## 2017-03-26 ENCOUNTER — Ambulatory Visit: Payer: Medicare Other

## 2017-03-26 DIAGNOSIS — M542 Cervicalgia: Secondary | ICD-10-CM | POA: Diagnosis not present

## 2017-03-26 DIAGNOSIS — M25612 Stiffness of left shoulder, not elsewhere classified: Secondary | ICD-10-CM | POA: Diagnosis not present

## 2017-03-26 DIAGNOSIS — M25611 Stiffness of right shoulder, not elsewhere classified: Secondary | ICD-10-CM | POA: Diagnosis not present

## 2017-03-26 DIAGNOSIS — R293 Abnormal posture: Secondary | ICD-10-CM | POA: Diagnosis not present

## 2017-03-26 DIAGNOSIS — R252 Cramp and spasm: Secondary | ICD-10-CM | POA: Diagnosis not present
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
## 2017-03-27 ENCOUNTER — Encounter: Payer: Medicare Other | Admitting: Physical Therapy

## 2017-03-27 DIAGNOSIS — F439 Reaction to severe stress, unspecified: Secondary | ICD-10-CM | POA: Diagnosis not present

## 2017-03-28 ENCOUNTER — Ambulatory Visit: Payer: Medicare Other

## 2017-03-28 DIAGNOSIS — M542 Cervicalgia: Secondary | ICD-10-CM | POA: Diagnosis not present

## 2017-03-28 DIAGNOSIS — R252 Cramp and spasm: Secondary | ICD-10-CM | POA: Diagnosis not present

## 2017-03-28 DIAGNOSIS — R293 Abnormal posture: Secondary | ICD-10-CM | POA: Diagnosis not present

## 2017-03-28 DIAGNOSIS — M25612 Stiffness of left shoulder, not elsewhere classified: Secondary | ICD-10-CM | POA: Diagnosis not present

## 2017-03-28 DIAGNOSIS — M25611 Stiffness of right shoulder, not elsewhere classified: Secondary | ICD-10-CM | POA: Diagnosis not present
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
## 2017-04-04 ENCOUNTER — Ambulatory Visit: Payer: Medicare Other | Attending: Neurological Surgery | Admitting: Physical Therapy

## 2017-04-04 DIAGNOSIS — R293 Abnormal posture: Secondary | ICD-10-CM | POA: Insufficient documentation

## 2017-04-04 DIAGNOSIS — M542 Cervicalgia: Secondary | ICD-10-CM | POA: Diagnosis not present
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
## 2017-04-04 NOTE — Patient Instructions (Addendum)
VOR 1 and VOR 2 testing - negative  VBI testing - negative bilaterally   Manual traction in supine

## 2017-04-08 ENCOUNTER — Encounter: Payer: Medicare Other | Admitting: Physical Therapy

## 2017-04-09 ENCOUNTER — Ambulatory Visit: Payer: Medicare Other | Admitting: Physical Therapy

## 2017-04-09 DIAGNOSIS — R293 Abnormal posture: Secondary | ICD-10-CM

## 2017-04-09 DIAGNOSIS — M542 Cervicalgia: Secondary | ICD-10-CM
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
## 2017-04-09 NOTE — Patient Instructions (Signed)
Cerical AROM  R rotation - 63 degrees L rotation - 61 degrees (mild pain)   Full active shoulder flexion and abduction (on abduction mild R pain)   IR/ER bilaterally MMT (mild pain with IR on R, no pain on L)   No pain with extension or abduction bilaterally   PROM into ER - WNL bilaterally but mild pain on R, IR -- no deficits on L -- PT passively stretched into ER x 2 minutes  Isometric ER in supine at 80 degrees of abduction and neutral rotation x 8 for 5" holds submaximal

## 2017-04-10 DIAGNOSIS — M47812 Spondylosis without myelopathy or radiculopathy, cervical region: Secondary | ICD-10-CM | POA: Diagnosis not present

## 2017-04-10 DIAGNOSIS — M542 Cervicalgia: Secondary | ICD-10-CM | POA: Diagnosis not present

## 2017-04-10 DIAGNOSIS — R1310 Dysphagia, unspecified: Secondary | ICD-10-CM | POA: Diagnosis not present

## 2017-04-11 ENCOUNTER — Ambulatory Visit: Payer: Medicare Other | Admitting: Physical Therapy

## 2017-04-11 DIAGNOSIS — R293 Abnormal posture: Secondary | ICD-10-CM | POA: Diagnosis not present

## 2017-04-11 DIAGNOSIS — M542 Cervicalgia: Secondary | ICD-10-CM | POA: Diagnosis not present

## 2017-04-11 NOTE — Patient Instructions (Signed)
Soft tissue mobilization   CPAs grade I-II   Standing bilateral ER with yellow t-band x 15  Standing rows with 15# x12 reptitions   Seated rows with 15# x 12 repetitions
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
## 2017-04-15 DIAGNOSIS — G252 Other specified forms of tremor: Secondary | ICD-10-CM | POA: Diagnosis not present

## 2017-04-15 DIAGNOSIS — Z79899 Other long term (current) drug therapy: Secondary | ICD-10-CM | POA: Diagnosis not present

## 2017-04-15 DIAGNOSIS — Z23 Encounter for immunization: Secondary | ICD-10-CM | POA: Diagnosis not present

## 2017-04-15 DIAGNOSIS — I10 Essential (primary) hypertension: Secondary | ICD-10-CM | POA: Diagnosis not present

## 2017-04-15 DIAGNOSIS — Z0001 Encounter for general adult medical examination with abnormal findings: Secondary | ICD-10-CM | POA: Diagnosis not present

## 2017-04-15 DIAGNOSIS — E785 Hyperlipidemia, unspecified: Secondary | ICD-10-CM | POA: Diagnosis not present

## 2017-04-15 DIAGNOSIS — Z6826 Body mass index (BMI) 26.0-26.9, adult: Secondary | ICD-10-CM | POA: Diagnosis not present

## 2017-04-15 DIAGNOSIS — R42 Dizziness and giddiness: Secondary | ICD-10-CM | POA: Diagnosis not present

## 2017-04-16 ENCOUNTER — Ambulatory Visit: Payer: Medicare Other | Admitting: Physical Therapy

## 2017-04-18 ENCOUNTER — Ambulatory Visit: Payer: Medicare Other | Admitting: Physical Therapy

## 2017-04-23 ENCOUNTER — Ambulatory Visit: Payer: Medicare Other | Admitting: Physical Therapy

## 2017-04-25 ENCOUNTER — Ambulatory Visit: Payer: Medicare Other | Admitting: Physical Therapy

## 2017-04-25 DIAGNOSIS — M542 Cervicalgia: Secondary | ICD-10-CM | POA: Diagnosis not present

## 2017-04-25 DIAGNOSIS — R293 Abnormal posture: Secondary | ICD-10-CM

## 2017-04-25 NOTE — Patient Instructions (Signed)
L rotation cervical - 70 degrees  R rotation -65 degrees
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
## 2017-04-29 DIAGNOSIS — Z6826 Body mass index (BMI) 26.0-26.9, adult: Secondary | ICD-10-CM | POA: Diagnosis not present

## 2017-04-29 DIAGNOSIS — E039 Hypothyroidism, unspecified: Secondary | ICD-10-CM | POA: Diagnosis not present

## 2017-04-29 DIAGNOSIS — E663 Overweight: Secondary | ICD-10-CM | POA: Diagnosis not present

## 2017-04-30 DIAGNOSIS — R413 Other amnesia: Secondary | ICD-10-CM | POA: Diagnosis not present

## 2017-04-30 DIAGNOSIS — M542 Cervicalgia: Secondary | ICD-10-CM | POA: Diagnosis not present

## 2017-04-30 DIAGNOSIS — R51 Headache: Secondary | ICD-10-CM | POA: Diagnosis not present

## 2017-05-01 IMAGING — RF DG FLUORO GUIDE NDL PLC/BX
4 series · 4 of 4 positions shown · IV contrast (multihance)
Comparison: none

CLINICAL DATA: Fall last [REDACTED].  Left shoulder pain.

EXAM:
LEFT SHOULDER INJECTION UNDER FLUOROSCOPY
TECHNIQUE: An appropriate skin entrance site was determined. The site was
marked, prepped with Betadine, draped in the usual sterile fashion,
and infiltrated locally with buffered Lidocaine. 22 gauge spinal
needle was advanced to the superomedial margin of the humeral head
under intermittent fluoroscopy. 1 ml of Lidocaine injected easily. A
mixture of 0.1 ml Multihance 15 ml of Omnipaque 300 and 5 mL
lidocaine was then used to opacify the left shoulder capsule. No
immediate complication.
FLUOROSCOPY TIME:  Radiation Exposure Index (as provided by the
fluoroscopic device):
If the device does not provide the exposure index:
Fluoroscopy Time (in minutes and seconds):  1 minutes 36 seconds
Number of Acquired Images:  0

[Series 1: run · 1 of 1 slices shown (1 of 4)]
[im 1/1]
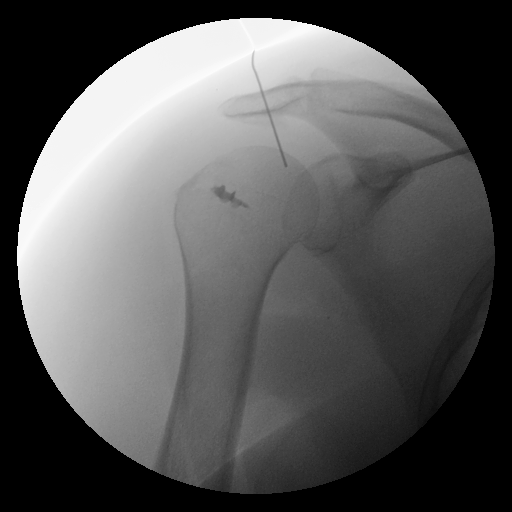

[Series 2: run · 1 of 1 slices shown (2 of 4)]
[im 1/1]
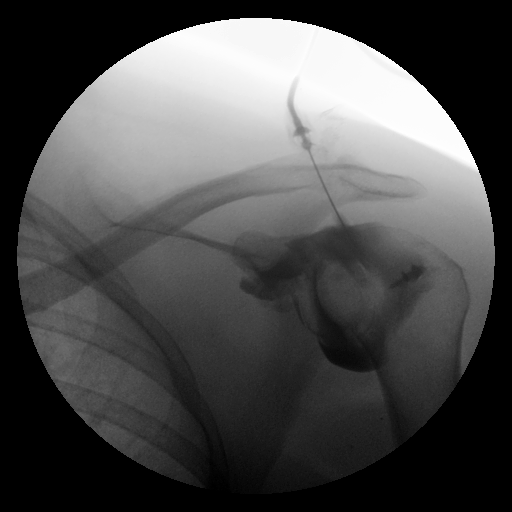

[Series 3: run · 1 of 1 slices shown (3 of 4)]
[im 1/1]
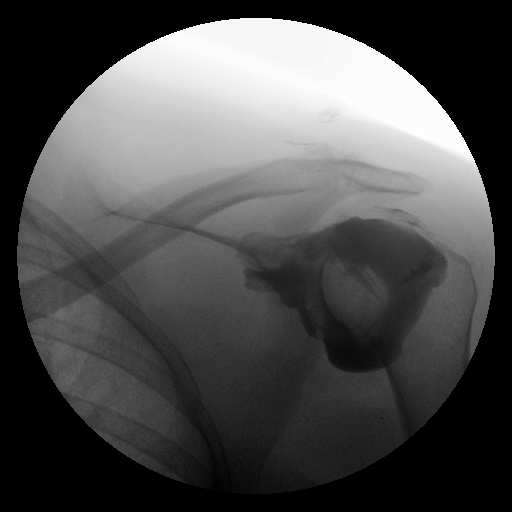

[Series 4: run · 1 of 1 slices shown (4 of 4)]
[im 1/1]
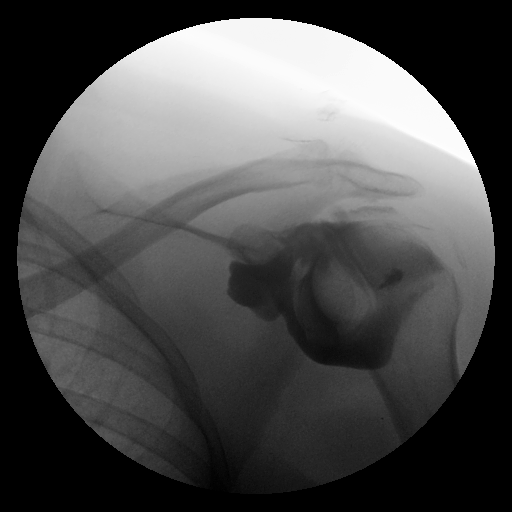

[4 of 4 positions shown; findings below may reference images not displayed]

FINDINGS: After rotation of the shoulder, imaging demonstrates contrast
passing into the subacromial space suggesting rotator cuff tear.
Please see MRI report follow.
IMPRESSION: Technically successful left shoulder injection for MRI.

## 2017-05-22 DIAGNOSIS — F4310 Post-traumatic stress disorder, unspecified: Secondary | ICD-10-CM | POA: Diagnosis not present

## 2017-05-28 DIAGNOSIS — R413 Other amnesia: Secondary | ICD-10-CM | POA: Diagnosis not present

## 2017-05-28 DIAGNOSIS — R51 Headache: Secondary | ICD-10-CM | POA: Diagnosis not present

## 2017-05-28 DIAGNOSIS — M542 Cervicalgia: Secondary | ICD-10-CM | POA: Diagnosis not present

## 2017-05-31 DIAGNOSIS — F4310 Post-traumatic stress disorder, unspecified: Secondary | ICD-10-CM | POA: Diagnosis not present

## 2017-06-04 DIAGNOSIS — L728 Other follicular cysts of the skin and subcutaneous tissue: Secondary | ICD-10-CM | POA: Diagnosis not present

## 2017-06-25 IMAGING — US US ABDOMEN LIMITED
1 series · 14 of 25 positions shown · non-contrast
Comparison: 11/02/2011

CLINICAL DATA: Vomiting. Elevated bilirubin. History of
hypertension, GERD, and history of stones.

EXAM:
US ABDOMEN LIMITED - RIGHT UPPER QUADRANT

[Series 1: us abdomen limited · 0.17mm/px · 14 of 85 slices shown]
[im 1/85]
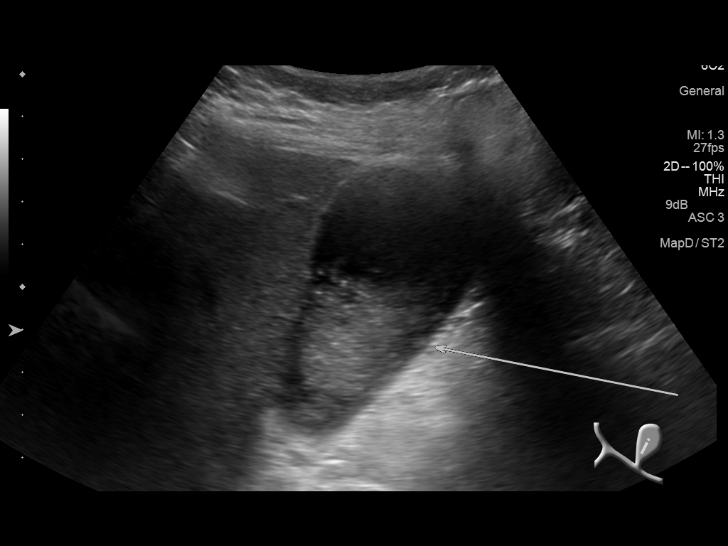
[im 8/85]
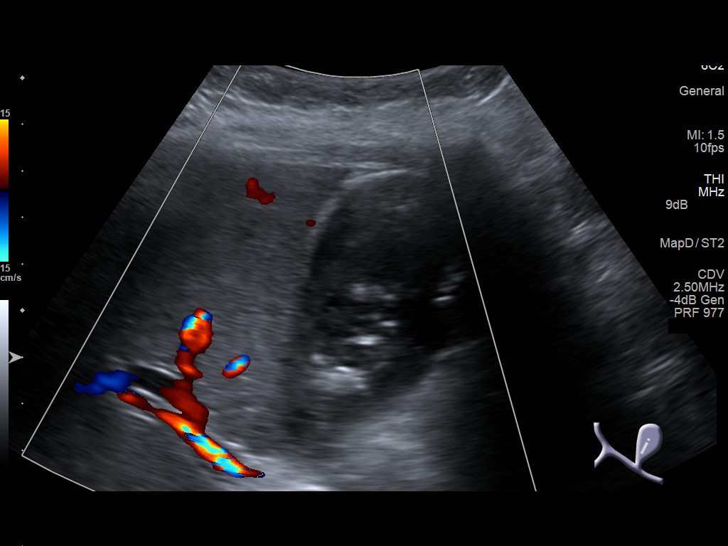
[im 15/85]
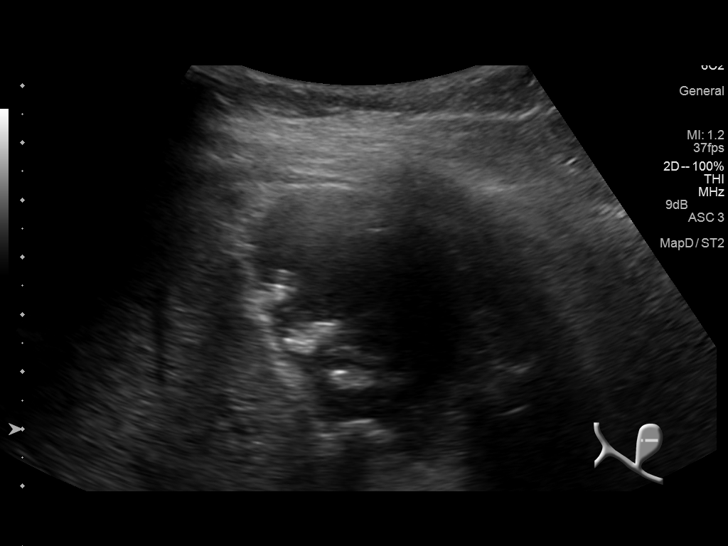
[im 22/85]
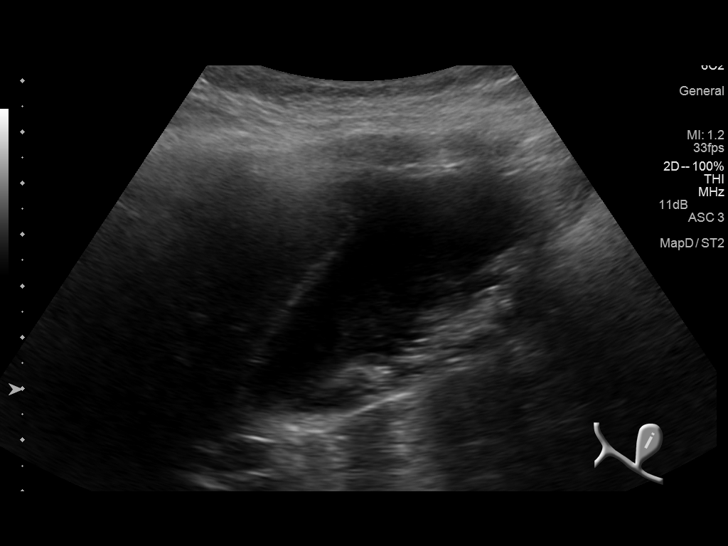
[im 29/85]
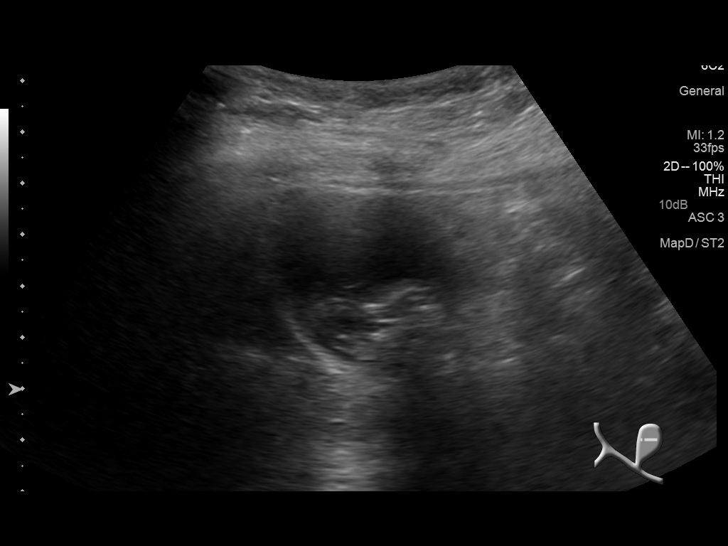
[im 32/85]
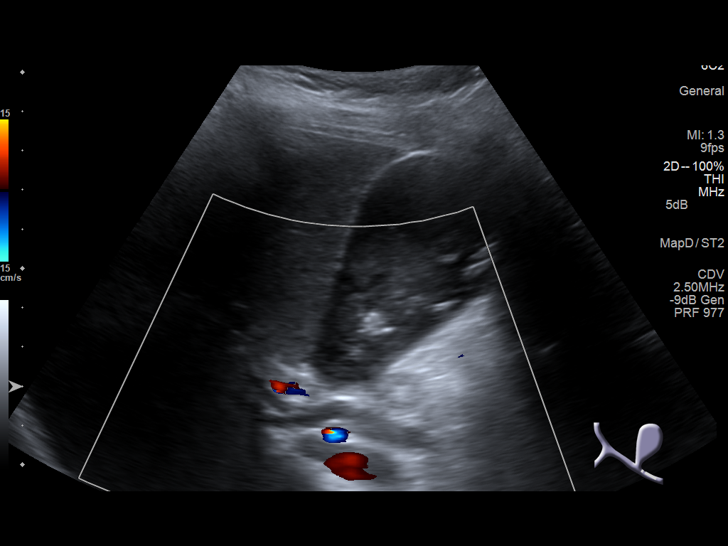
[im 39/85]
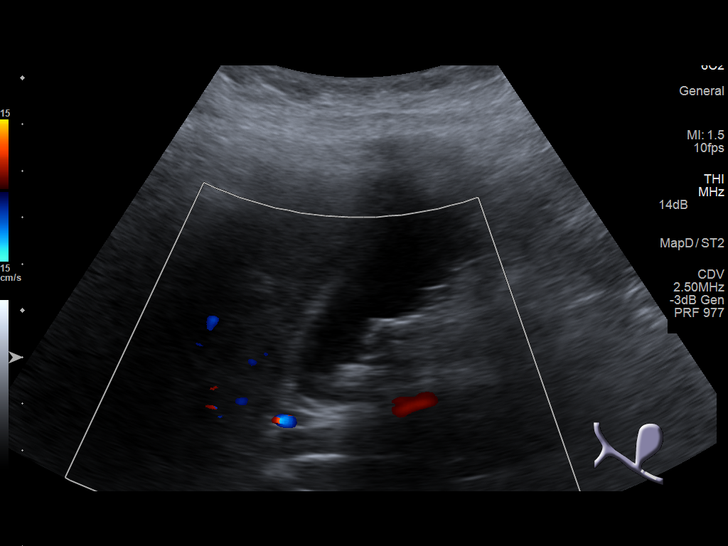
[im 46/85]
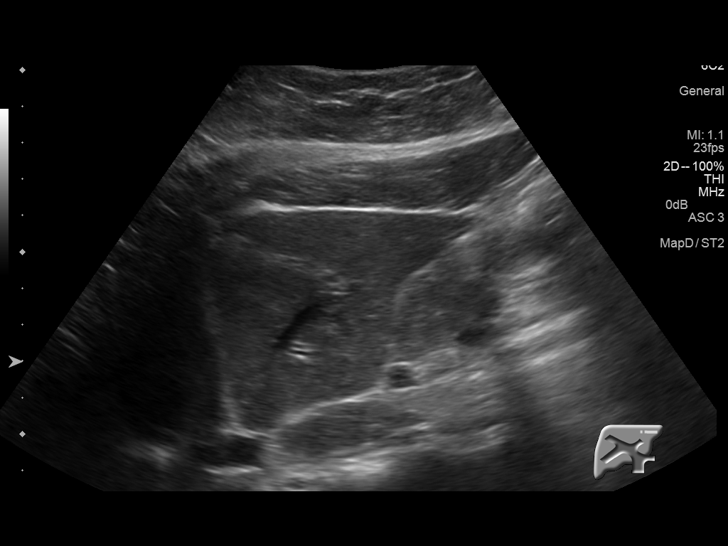
[im 53/85]
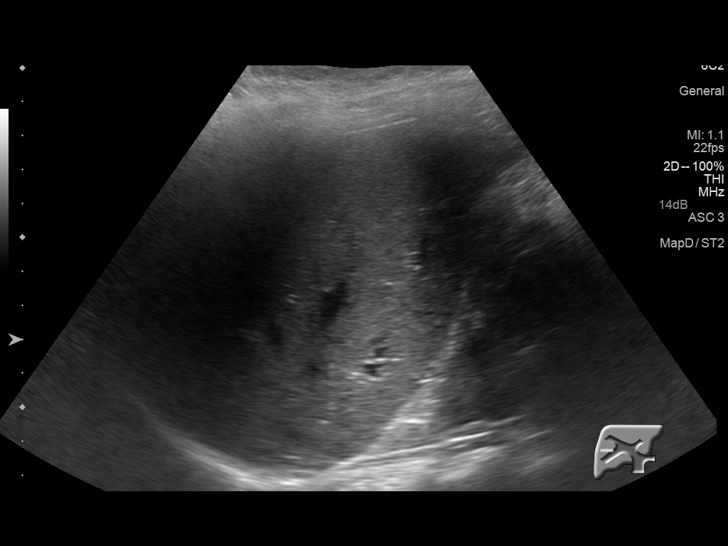
[im 57/85]
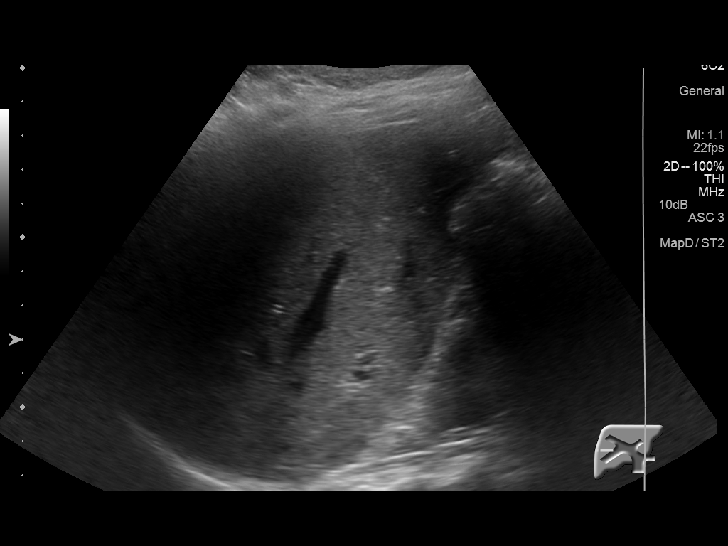
[im 64/85]
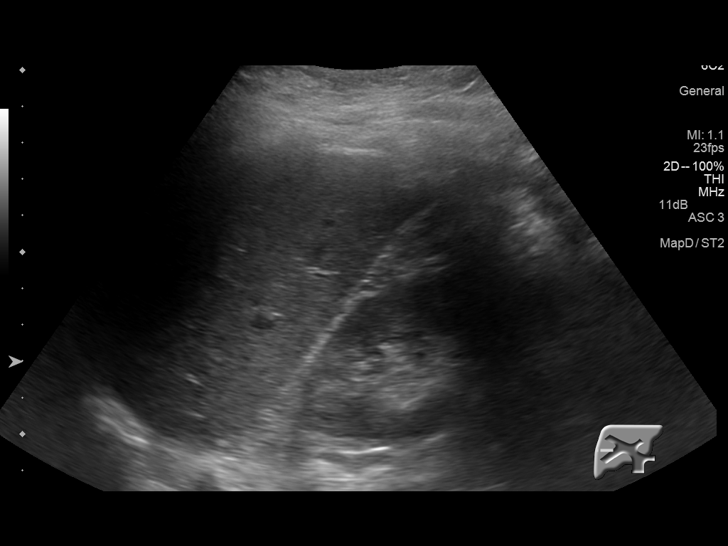
[im 71/85]
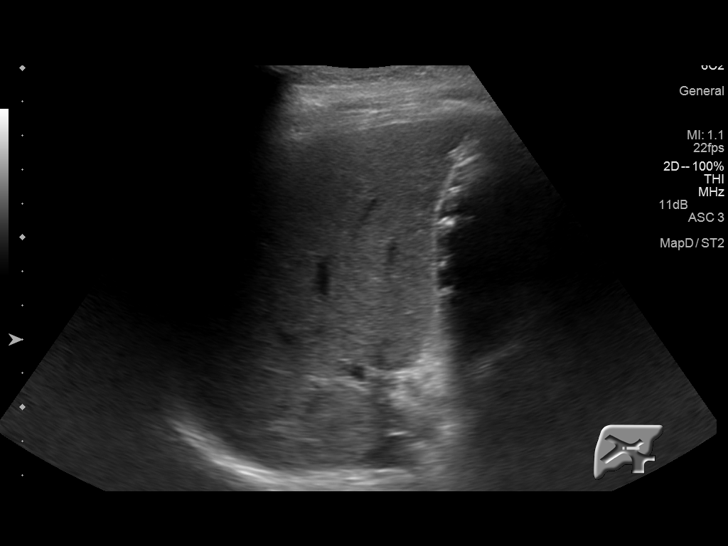
[im 78/85]
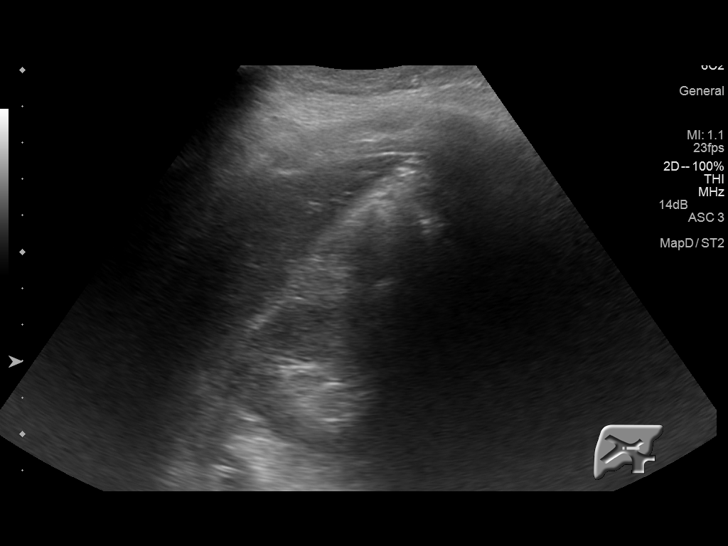
[im 85/85]
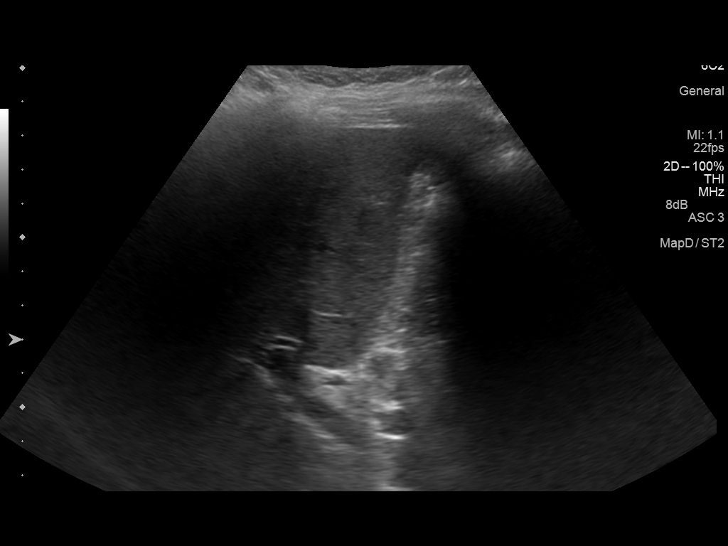

[14 of 25 positions shown; findings below may reference images not displayed]

FINDINGS: Gallbladder:

Within the gallbladder there are numerous shadowing stones and
sludge. Largest measurable stone is 1.0 cm. Gallbladder wall is
upper limits normal in thickness, 2.7 mm. No sonographic Murphy's
sign.

Common bile duct:

Diameter: 7.1 mm no visible choledocholithiasis. Portions of the
duct are obscured by shadowing from stones and bowel gas.

Liver:

No focal lesion identified. Within normal limits in parenchymal
echogenicity.
IMPRESSION: 1. Dilated common bile duct with no visible choledocholithiasis.
2. Cholelithiasis. Gallbladder wall upper limits normal in
thickness. Negative sonographic Murphy's sign.

## 2017-06-26 DIAGNOSIS — F329 Major depressive disorder, single episode, unspecified: Secondary | ICD-10-CM | POA: Diagnosis not present

## 2017-06-26 DIAGNOSIS — Z6826 Body mass index (BMI) 26.0-26.9, adult: Secondary | ICD-10-CM | POA: Diagnosis not present

## 2017-06-26 DIAGNOSIS — J449 Chronic obstructive pulmonary disease, unspecified: Secondary | ICD-10-CM | POA: Diagnosis not present

## 2017-06-26 DIAGNOSIS — E039 Hypothyroidism, unspecified: Secondary | ICD-10-CM | POA: Diagnosis not present

## 2017-06-26 DIAGNOSIS — R3129 Other microscopic hematuria: Secondary | ICD-10-CM | POA: Diagnosis not present

## 2017-06-26 DIAGNOSIS — I10 Essential (primary) hypertension: Secondary | ICD-10-CM | POA: Diagnosis not present

## 2017-06-26 DIAGNOSIS — R350 Frequency of micturition: Secondary | ICD-10-CM | POA: Diagnosis not present

## 2017-06-26 DIAGNOSIS — Z1389 Encounter for screening for other disorder: Secondary | ICD-10-CM | POA: Diagnosis not present

## 2017-06-26 DIAGNOSIS — E663 Overweight: Secondary | ICD-10-CM | POA: Diagnosis not present

## 2017-06-28 DIAGNOSIS — F4310 Post-traumatic stress disorder, unspecified: Secondary | ICD-10-CM | POA: Diagnosis not present

## 2017-07-02 DIAGNOSIS — H04123 Dry eye syndrome of bilateral lacrimal glands: Secondary | ICD-10-CM | POA: Diagnosis not present

## 2017-07-15 ENCOUNTER — Emergency Department: Payer: Medicare Other

## 2017-07-15 ENCOUNTER — Emergency Department
Admission: EM | Admit: 2017-07-15 | Discharge: 2017-07-15 | Disposition: A | Discharge status: Home / Self Care | Payer: Medicare Other | Attending: Emergency Medicine | Admitting: Emergency Medicine

## 2017-07-15 DIAGNOSIS — I10 Essential (primary) hypertension: Secondary | ICD-10-CM | POA: Insufficient documentation

## 2017-07-15 DIAGNOSIS — Z79899 Other long term (current) drug therapy: Secondary | ICD-10-CM | POA: Insufficient documentation

## 2017-07-15 DIAGNOSIS — J45909 Unspecified asthma, uncomplicated: Secondary | ICD-10-CM | POA: Insufficient documentation

## 2017-07-15 DIAGNOSIS — R55 Syncope and collapse: Secondary | ICD-10-CM

## 2017-07-15 DIAGNOSIS — R42 Dizziness and giddiness: Secondary | ICD-10-CM | POA: Insufficient documentation

## 2017-07-15 DIAGNOSIS — R1084 Generalized abdominal pain: Secondary | ICD-10-CM | POA: Insufficient documentation

## 2017-07-15 DIAGNOSIS — R0789 Other chest pain: Secondary | ICD-10-CM | POA: Insufficient documentation

## 2017-07-15 DIAGNOSIS — R079 Chest pain, unspecified: Secondary | ICD-10-CM | POA: Diagnosis not present

## 2017-07-15 LAB — BASIC METABOLIC PANEL
ANION GAP: 9 (ref 5–15)
BUN: 11 mg/dL (ref 6–20)
CHLORIDE: 101 mmol/L (ref 101–111)
CO2: 27 mmol/L (ref 22–32)
CREATININE: 0.71 mg/dL (ref 0.44–1.00)
Calcium: 9.4 mg/dL (ref 8.9–10.3)
GFR calc non Af Amer: >60 mL/min (ref >60)
Glucose, Bld: 102 mg/dL — ABNORMAL HIGH (ref 65–99)
POTASSIUM: 4.1 mmol/L (ref 3.5–5.1)
SODIUM: 137 mmol/L (ref 135–145)

## 2017-07-15 LAB — CBC
HEMATOCRIT: 41.8 % (ref 35.0–47.0)
HEMOGLOBIN: 13.8 g/dL (ref 12.0–16.0)
MCH: 30.8 pg (ref 26.0–34.0)
MCHC: 33 g/dL (ref 32.0–36.0)
MCV: 93.4 fL (ref 80.0–100.0)
PLATELETS: 247 10*3/uL (ref 150–440)
RBC: 4.47 MIL/uL (ref 3.80–5.20)
RDW: 12.7 % (ref 11.5–14.5)
WBC: 11.6 10*3/uL — AB (ref 3.6–11.0)

## 2017-07-15 LAB — LIPASE, BLOOD: Lipase: 23 U/L (ref 11–51)

## 2017-07-15 LAB — TROPONIN I: Troponin I: <0.03 ng/mL (ref <0.03)

## 2017-07-15 MED ORDER — OXYCODONE-ACETAMINOPHEN 5-325 MG PO TABS: 1.0000 | ORAL_TABLET | Freq: Four times a day (QID) | ORAL | 0 refills | Status: DC | PRN

## 2017-07-15 MED ORDER — OXYCODONE-ACETAMINOPHEN 5-325 MG PO TABS
2.0000 | ORAL_TABLET | Freq: Once | ORAL | Status: AC
  Administered 2017-07-15: 2 via ORAL
  Filled 2017-07-15: qty 2

## 2017-07-15 NOTE — ED Notes (Signed)
Pt is able to ambulate with steady gait to POV with husband. NAD, VSS, Resp non-labored and equal, skin PWD. No questions or concerns voiced during discharge instructions.

## 2017-07-15 NOTE — ED Triage Notes (Signed)
Pt states yesterday began to have pain in left upper abd. Pt states she went into the restroom, fainted, and "came to on the floor". Pt states she had vomiting and diarrhea after fainting. Pt states now abd pain is gone and she has left chest pain radiating to her back. Pt denies shob. Pt states pain is worse with deep inspiration.

## 2017-07-15 NOTE — Discharge Instructions (Signed)
As we discussed please use your incentive spirometer 3 times per hour while awake for the next 7 days.  Please take your pain medication as needed, as written.  You may also use ibuprofen as needed, as written on the box.  Please follow-up with your doctor within the next 1 week for recheck/reevaluation.  Return to the emergency department for any worsening chest pain, development of any trouble breathing, or any other symptom personally concerning to yourself.  Please drink plenty of fluids and obtain plenty of rest over the next several days.

## 2017-07-15 NOTE — ED Provider Notes (Signed)
[facility] Emergency Department Provider Note  Time seen: 8:34 AM  I have reviewed the triage vital signs and the nursing notes.   HISTORY  Chief Complaint Abdominal Pain; Loss of Consciousness; and Chest Pain    HPI Kimberly [name] is a 56 y.o. female with a past medical history of anxiety, arthritis, depression, gastric reflux, hypertension, presents to the emergency department with left-sided chest pain.  According to the patient yesterday around 2 PM she developed an upset stomach.  States she was having abdominal cramping became nauseated had to go to the bathroom, vomited and had an episode of diarrhea.  Patient states while running to the bathroom just before she was able to get to the toilet she became very nauseated and woke up on the floor.  Patient states she has had vomiting as well as diarrhea.  Patient states she has passed out before she was not overly concerned it did not come for evaluation.  Patient states she landed on the bathtub on her chest.  Since last night she has had left-sided chest pain worse with palpation movement or inspiration.  The patient states the chest pain remained today so her husband convinced her to come to the emergency department for evaluation.  Patient denies any abdominal discomfort nausea vomiting or diarrhea currently.  Denies any shortness of breath or trouble breathing.  Does state pain with deep inspiration.   Past Medical History:  Diagnosis Date  . Anxiety    takes alprazolam - rare use   . Arthritis    cervical spondylosis   . Asthma    SEES  PULM W/ [place]    . Balance problem   . Brain cyst   . Depression   . Difficult intubation    Small mouth opening, limited neck flexion, very anterior   . GERD (gastroesophageal reflux disease)    pt. reports that its better, no meds in use at this time- 2015  . Headache   . Herpes   . History of kidney stones   . History of pneumonia   . Hypertension    pt.  doesn't see cardiologist, followed for HTN by Dr. Gerarda Fraction  . Inflammation of shoulder joint   . Memory difficulties   . Pneumothorax on right    following C4-6 ACDF 07/04/16  . PONV (postoperative nausea and vomiting)    WITH SURGERY 07/04/16 (NECK) RT LUNG COLLAPSED  . Recurrent falls   . Shortness of breath dyspnea    WITH EXERTION   . Urinary frequency     Patient Active Problem List   Diagnosis Date Noted  . Pseudoarthrosis of cervical spine ([place]) 11/13/2016  . Asthma 07/05/2016  . Acute chest wall pain 07/05/2016  . GERD (gastroesophageal reflux disease) 07/05/2016  . Spontaneous pneumothorax   . Cervical stenosis of spinal canal 08/06/2014  . Muscle weakness (generalized) 11/12/2013  . Tight fascia 11/12/2013  . Decreased range of motion of shoulder 11/12/2013  . Cervical spondylosis without myelopathy 10/29/2013  . S/P carpal tunnel release 02/19/2013  . CTS (carpal tunnel syndrome) 02/19/2013  . SHOULDER PAIN 10/06/2007  . IMPINGEMENT SYNDROME 10/06/2007  . RUPTURE ROTATOR CUFF 10/06/2007  . HTN (hypertension) 10/03/2007    Past Surgical History:  Procedure Laterality Date  . ABDOMINAL HYSTERECTOMY    . ANTERIOR CERVICAL DECOMP/DISCECTOMY FUSION N/A 08/06/2014   Procedure: ANTERIOR CERVICAL DECOMPRESSION/DISCECTOMY FUSION CERVICAL THREE-FOUR,CERVICAL SIX-SEVEN ,CERVICAL SEVEN-THORACIC ONE;  Surgeon: Floyce Stakes, MD;  Location: [place];  Service: Neurosurgery;  Laterality: N/A;  . ANTERIOR CERVICAL DECOMP/DISCECTOMY FUSION N/A 07/04/2016   Procedure: CERVICAL FOUR-FIVE, CERVICAL FIVE-SIX ANTERIOR CERVICAL DECOMPRESSION/DISCECTOMY/FUSION WITH REVISION OF CERVICAL THREE-FOUR PLATE;  Surgeon: Leeroy Cha, MD;  Location: [place];  Service: Neurosurgery;  Laterality: N/A;  . CARPAL TUNNEL RELEASE Left 02/16/2013   Procedure: CARPAL TUNNEL RELEASE;  Surgeon: Carole Civil, MD;  Location: [facility];  Service: Orthopedics;  Laterality: Left;  . CARPAL TUNNEL RELEASE Right  03/27/2013   Procedure: RIGHT CARPAL TUNNEL RELEASE;  Surgeon: Carole Civil, MD;  Location: [facility];  Service: Orthopedics;  Laterality: Right;  . CESAREAN SECTION     x2  . CHOLECYSTECTOMY N/A 06/17/2015   Procedure: LAPAROSCOPIC CHOLECYSTECTOMY;  Surgeon: Aviva Signs, MD;  Location: [facility];  Service: General;  Laterality: N/A;  . COLONOSCOPY    . FOREIGN BODY REMOVAL Left    knee-as child  . NASAL SINUS SURGERY N/A 04/13/2015   Procedure: nasal endoscopy with adenoid biopsy;  Surgeon: Ruby Cola, MD;  Location: [place];  Service: ENT;  Laterality: N/A;  . NECK SURGERY  2016  . POSTERIOR CERVICAL FUSION/FORAMINOTOMY N/A 11/13/2016   Procedure: CERVICAL TWO-CERVICAL SIX POSTERIOR CERVICAL FUSION WITH LATERAL MASS FIXATION;  Surgeon: Kristeen Miss, MD;  Location: [place];  Service: Neurosurgery;  Laterality: N/A;  posterior approach  . ROTATOR CUFF REPAIR Left   . SHOULDER ACROMIOPLASTY Left 05/18/2015   Procedure: SHOULDER ACROMIOPLASTY;  Surgeon: Earlie Server, MD;  Location: [place];  Service: Orthopedics;  Laterality: Left;  . SHOULDER ARTHROSCOPY WITH DISTAL CLAVICLE RESECTION Left 05/18/2015   Procedure: LEFT SHOULDER ARTHROSCOPY WITH  DISTAL CLAVICLE RESECTION;  Surgeon: Earlie Server, MD;  Location: [place];  Service: Orthopedics;  Laterality: Left;    Prior to Admission medications   Medication Sig Start Date End Date Taking? Authorizing Provider  acetaminophen (TYLENOL) 500 MG tablet Take 500 mg by mouth 2 (two) times daily as needed for headache.    [provider]  acyclovir (ZOVIRAX) 400 MG tablet Take 400 mg by mouth 2 (two) times daily.     [provider]  albuterol (PROVENTIL HFA;VENTOLIN HFA) 108 (90 Base) MCG/ACT inhaler Inhale 2 puffs into the lungs every 4 (four) hours as needed for wheezing or shortness of breath. 10/03/16   Collene Gobble, MD  ALPRAZolam Duanne Moron) 0.5 MG tablet Take 0.5 mg by mouth 3 (three) times  daily as needed for anxiety.    [provider]  amLODipine (NORVASC) 5 MG tablet Take 5 mg by mouth daily.     [provider]  Baclofen 5 MG TABS Take 10 mg by mouth 3 (three) times daily as needed. Patient taking differently: Take 10 mg by mouth 3 (three) times daily as needed (spasms).  02/04/17   Nat Christen, MD  clotrimazole (LOTRIMIN) 1 % cream Apply 1 application topically daily as needed (itching).     [provider]  cyclobenzaprine (FLEXERIL) 10 MG tablet Take 1 tablet by mouth as needed for muscle spasms.     [provider]  diazepam (VALIUM) 10 MG tablet Take 10 mg by mouth daily as needed for anxiety.    [provider]  escitalopram (LEXAPRO) 10 MG tablet Take 10 mg by mouth daily as needed (anxiety).     [provider]  estradiol (ESTRACE) 1 MG tablet Take 1 mg by mouth daily.     [provider]  ibuprofen (ADVIL,MOTRIN) 600 MG tablet Take 1 tablet (600 mg total)  by mouth every 8 (eight) hours as needed (pain). 03/08/17   Wojeck, Bernadene Bell, NP  ibuprofen (IBU) 400 MG tablet Take 1 tablet (400 mg total) by mouth every 6 (six) hours as needed. Patient taking differently: Take 400 mg by mouth every 6 (six) hours as needed for moderate pain.  02/04/17   Nat Christen, MD  meclizine (ANTIVERT) 25 MG tablet Take 1 tablet (25 mg total) by mouth 3 (three) times daily as needed for dizziness. 02/24/17   Margarita Mail, PA-C  methocarbamol (ROBAXIN) 500 MG tablet Take 1 tablet (500 mg total) by mouth every 6 (six) hours as needed for muscle spasms. 11/15/16   Kristeen Miss, MD  nortriptyline (PAMELOR) 10 MG capsule Take 1 capsule by mouth at bedtime as needed for sleep.  08/07/16   [provider]  oxybutynin (DITROPAN-XL) 5 MG 24 hr tablet Take 5 mg by mouth 2 (two) times daily.     [provider]  oxyCODONE 10 MG TABS Take 1 tablet (10 mg total) by mouth every 4 (four) hours as needed for severe pain. 11/15/16   Kristeen Miss, MD  oxyCODONE-acetaminophen (PERCOCET/ROXICET) 5-325 MG tablet Take 1 tablet by mouth every 4 (four) hours as needed for severe pain. 02/05/17   Khatri, Hina, PA-C  pantoprazole (PROTONIX) 40 MG tablet Take 40 mg by mouth daily.    [provider]  Tetrahydrozoline HCl (VISINE OP) Apply 1 drop to eye daily as needed (irritation).    [provider]    Allergies  Allergen Reactions  . Shellfish Allergy Anaphylaxis and Hives    "Swells throat up"  . Peanut-Containing Drug Products Hives  . Codeine Other (See Comments)    jitters    Family History  Problem Relation Age of Onset  . Hypertension Father   . Hypertension Other     Social History Social History   Tobacco Use  . Smoking status: Never Smoker  . Smokeless tobacco: Never Used  Substance Use Topics  . Alcohol use: No  . Drug use: No    Review of Systems Constitutional: Negative for fever. Cardiovascular: Negative for chest pain. Respiratory: Negative for shortness of breath. Gastrointestinal: Negative for abdominal pain, vomiting and diarrhea.  Patient does state nausea vomiting diarrhea last night, but gone today. Neurological: Negative for headache All other ROS negative  ____________________________________________   PHYSICAL EXAM:  VITAL SIGNS: ED Triage Vitals  Enc Vitals Group     BP 07/15/17 0541 (!) 137/92     Pulse Rate 07/15/17 0541 96     Resp 07/15/17 0541 16     Temp 07/15/17 0545 98.4 F (36.9 C)     Temp Source 07/15/17 0541 Oral     SpO2 07/15/17 0541 99 %     Weight 07/15/17 0541 142 lb (64.4 kg)     Height 07/15/17 0541 5\' 1"  (1.549 m)     Head Circumference --      Peak Flow --      Pain Score 07/15/17 0541 8     Pain Loc --      Pain Edu? --      Excl. in [place]? --    Constitutional: Alert and oriented. Well appearing and in no distress. Eyes: Normal exam ENT   Head: Normocephalic and atraumatic   Mouth/Throat: Mucous membranes are  moist. Cardiovascular: Normal rate, regular rhythm. No murmur Respiratory: Normal respiratory effort without tachypnea nor retractions. Breath sounds are clear.  Moderate left chest wall tenderness to  palpation. Gastrointestinal: Soft and nontender. No distention.   Musculoskeletal: Nontender with normal range of motion in all extremities.  Neurologic:  Normal speech and language. No gross focal neurologic deficits  Skin:  Skin is warm, dry and intact.  Psychiatric: Mood and affect are normal.  ____________________________________________    EKG  EKG reviewed and interpreted by myself shows sinus tachycardia at 113 bpm with a narrow QRS, left axis deviation, largely normal intervals nonspecific ST changes without ST elevation.  ____________________________________________    RADIOLOGY  X-ray shows no acute abnormality.  ____________________________________________   INITIAL IMPRESSION / ASSESSMENT AND PLAN / ED COURSE  Pertinent labs & imaging results that were available during my care of the patient were reviewed by me and considered in my medical decision making (see chart for details).  Patient presents to the emergency department left-sided chest pain worse with movement or deep inspiration.  Differential would include rib contusions, rib fracture, pulmonary contusion, ACS.  Overall the patient appears extremely well, heart rate around 90 bpm during my examination, no distress, no difficulty breathing.  Patient has been expensing left-sided chest pain since 2:30 PM yesterday when she had a fall/syncopal episode landing on her left chest.  Patient's pain is very reproducible on examination, very consistent with chest wall pain.  Patient's labs including troponin are normal.  We will dose pain medication and discharge with incentive spirometry.  Patient agreeable to this plan of care.  ____________________________________________   FINAL CLINICAL IMPRESSION(S) / ED  DIAGNOSES  Chest wall pain Syncopal episode    Harvest Dark, MD 07/15/17 [phone]

## 2017-07-19 DIAGNOSIS — I10 Essential (primary) hypertension: Secondary | ICD-10-CM | POA: Diagnosis not present

## 2017-07-19 DIAGNOSIS — Z131 Encounter for screening for diabetes mellitus: Secondary | ICD-10-CM | POA: Diagnosis not present

## 2017-08-12 DIAGNOSIS — H04123 Dry eye syndrome of bilateral lacrimal glands: Secondary | ICD-10-CM | POA: Diagnosis not present

## 2017-08-13 DIAGNOSIS — E039 Hypothyroidism, unspecified: Secondary | ICD-10-CM | POA: Diagnosis not present

## 2017-08-14 DIAGNOSIS — I10 Essential (primary) hypertension: Secondary | ICD-10-CM | POA: Diagnosis not present

## 2017-08-14 DIAGNOSIS — M5412 Radiculopathy, cervical region: Secondary | ICD-10-CM | POA: Diagnosis not present

## 2017-08-14 DIAGNOSIS — M47812 Spondylosis without myelopathy or radiculopathy, cervical region: Secondary | ICD-10-CM | POA: Diagnosis not present

## 2017-08-14 DIAGNOSIS — Z6828 Body mass index (BMI) 28.0-28.9, adult: Secondary | ICD-10-CM | POA: Diagnosis not present

## 2017-08-22 DIAGNOSIS — Z9181 History of falling: Secondary | ICD-10-CM | POA: Diagnosis not present

## 2017-08-22 DIAGNOSIS — K219 Gastro-esophageal reflux disease without esophagitis: Secondary | ICD-10-CM | POA: Diagnosis not present

## 2017-08-22 DIAGNOSIS — I10 Essential (primary) hypertension: Secondary | ICD-10-CM | POA: Diagnosis not present

## 2017-08-22 DIAGNOSIS — R42 Dizziness and giddiness: Secondary | ICD-10-CM | POA: Diagnosis not present

## 2017-08-27 ENCOUNTER — Other Ambulatory Visit: Payer: Self-pay | Admitting: Internal Medicine

## 2017-08-27 DIAGNOSIS — I10 Essential (primary) hypertension: Secondary | ICD-10-CM | POA: Diagnosis not present

## 2017-08-27 DIAGNOSIS — Z1231 Encounter for screening mammogram for malignant neoplasm of breast: Secondary | ICD-10-CM | POA: Diagnosis not present

## 2017-08-27 DIAGNOSIS — Z1239 Encounter for other screening for malignant neoplasm of breast: Secondary | ICD-10-CM

## 2017-08-28 DIAGNOSIS — M5030 Other cervical disc degeneration, unspecified cervical region: Secondary | ICD-10-CM | POA: Insufficient documentation

## 2017-08-28 DIAGNOSIS — M542 Cervicalgia: Secondary | ICD-10-CM | POA: Diagnosis not present

## 2017-08-28 DIAGNOSIS — R55 Syncope and collapse: Secondary | ICD-10-CM | POA: Diagnosis not present

## 2017-08-28 DIAGNOSIS — R413 Other amnesia: Secondary | ICD-10-CM | POA: Diagnosis not present

## 2017-08-28 DIAGNOSIS — G44229 Chronic tension-type headache, not intractable: Secondary | ICD-10-CM | POA: Diagnosis not present

## 2017-09-04 IMAGING — DX DG CHEST 2V
2 series · 2 of 2 positions shown · non-contrast
Comparison: January 29, 2014.

CLINICAL DATA: Chest pain.

EXAM:
CHEST  2 VIEW

[chest pa]
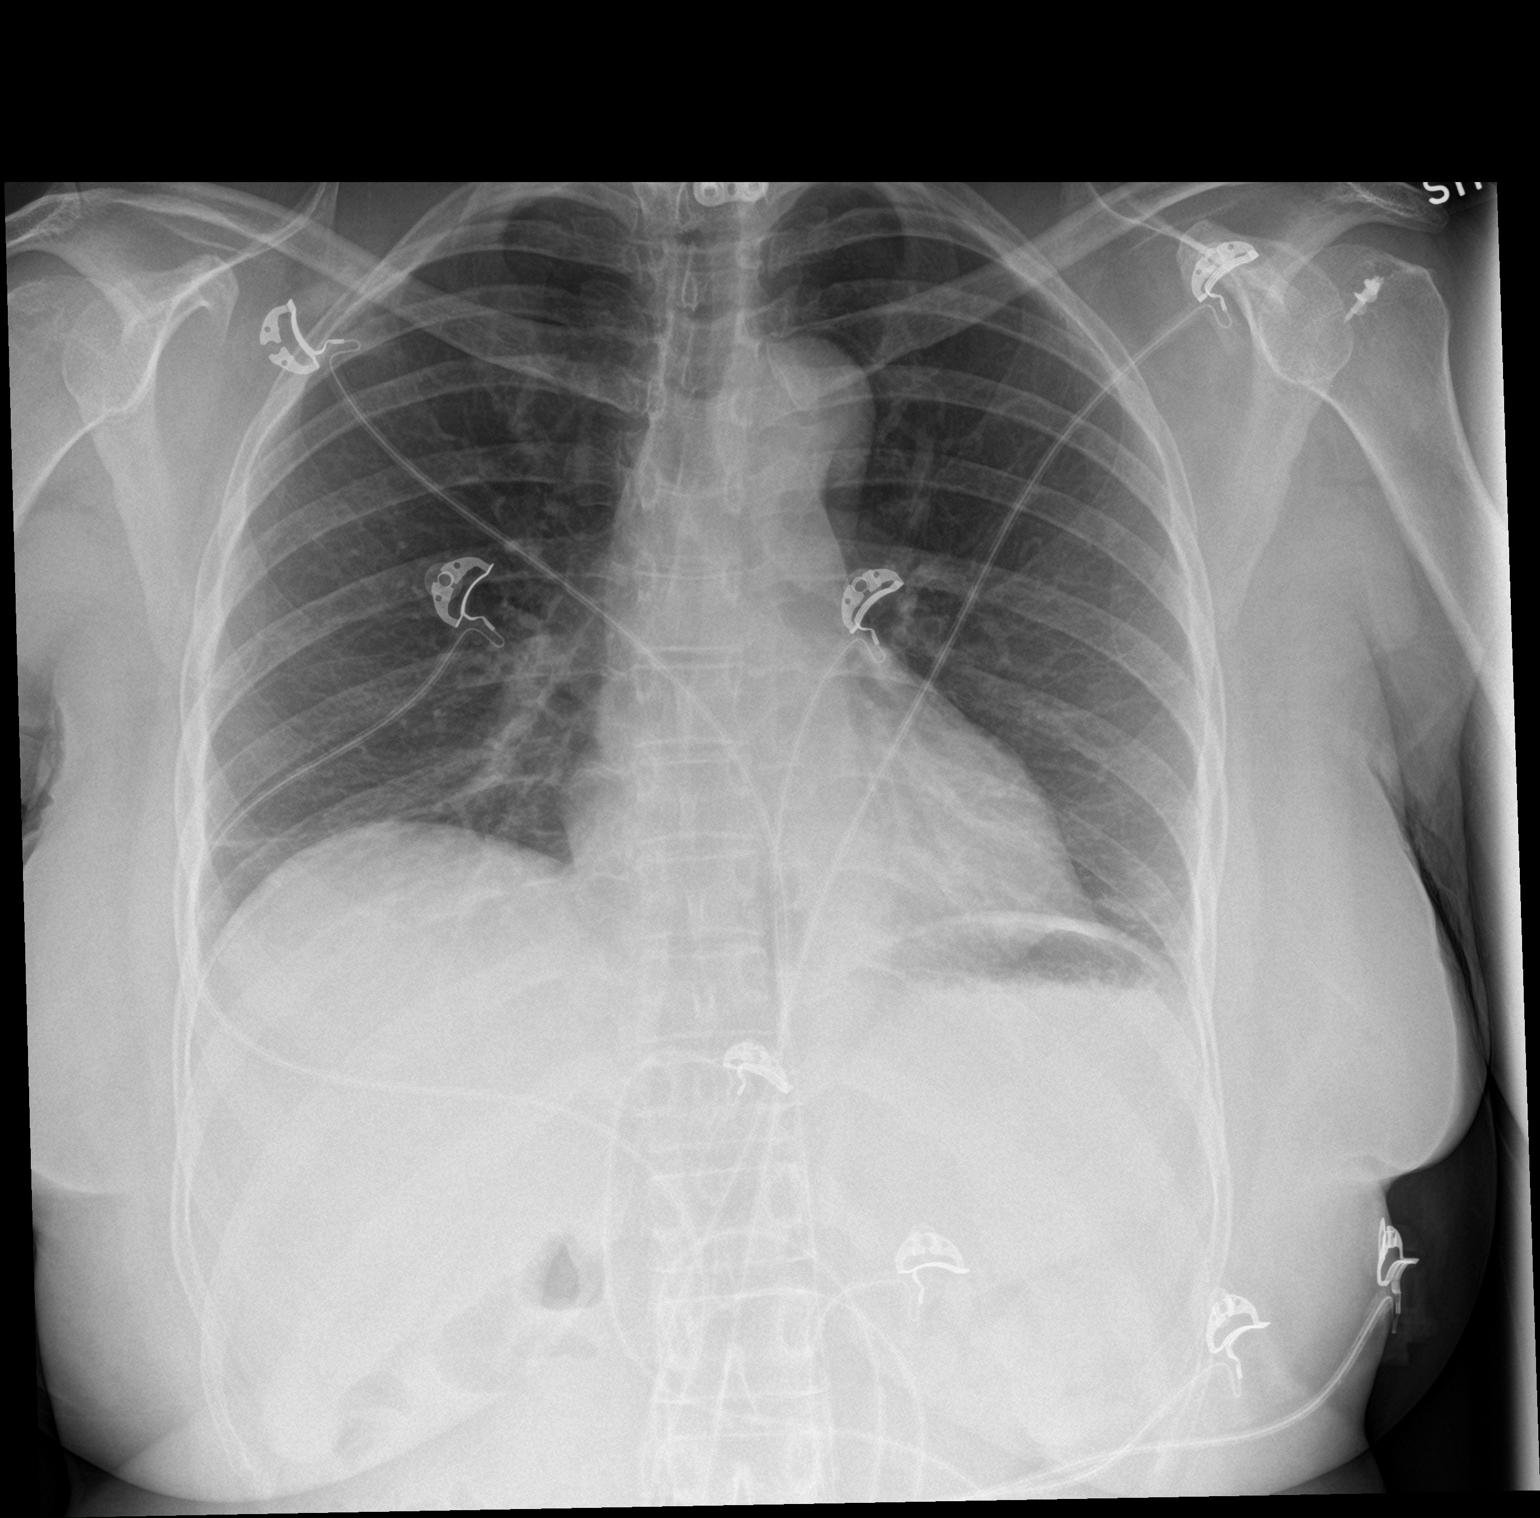

[chest lat]
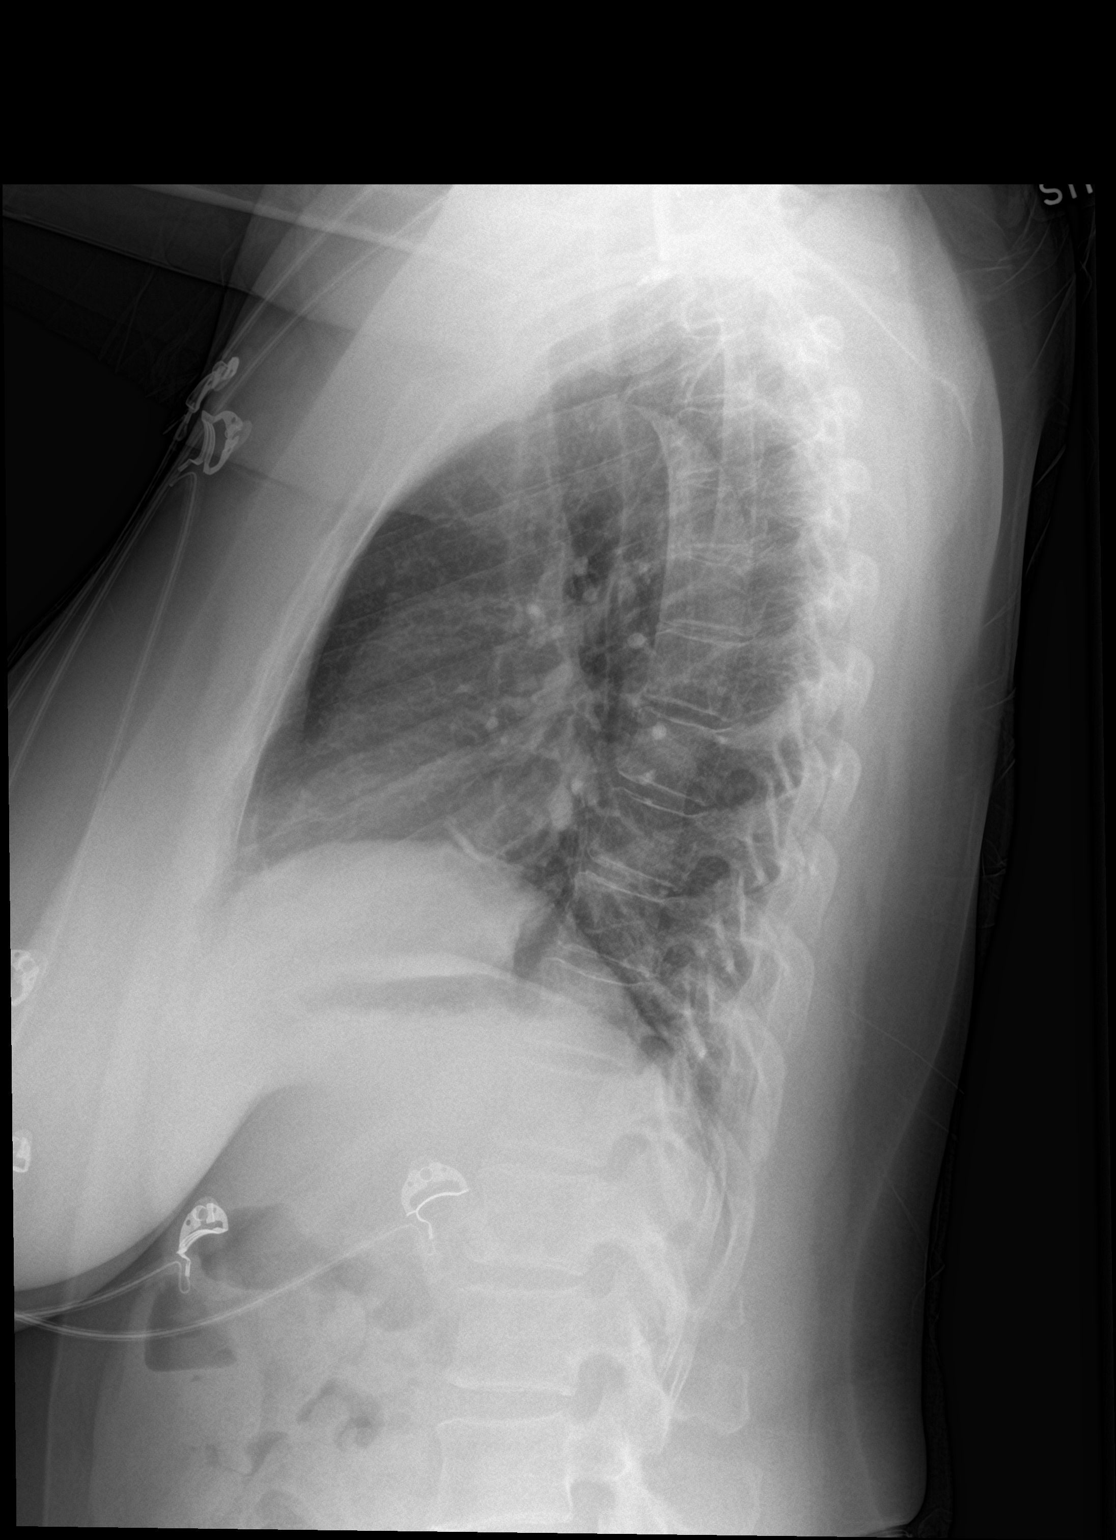

[2 of 2 positions shown; findings below may reference images not displayed]

FINDINGS: The heart size and mediastinal contours are within normal limits.
Both lungs are clear. No pneumothorax or pleural effusion is noted.
The visualized skeletal structures are unremarkable.
IMPRESSION: No active cardiopulmonary disease.

## 2017-09-05 ENCOUNTER — Other Ambulatory Visit: Payer: Self-pay | Admitting: Internal Medicine

## 2017-09-05 DIAGNOSIS — Z1239 Encounter for other screening for malignant neoplasm of breast: Secondary | ICD-10-CM

## 2017-09-05 DIAGNOSIS — R921 Mammographic calcification found on diagnostic imaging of breast: Secondary | ICD-10-CM

## 2017-09-19 ENCOUNTER — Ambulatory Visit
Admission: RE | Admit: 2017-09-19 | Discharge: 2017-09-19 | Disposition: A | Discharge status: Home / Self Care | Payer: 59 | Source: Ambulatory Visit | Attending: Internal Medicine | Admitting: Internal Medicine

## 2017-09-19 DIAGNOSIS — R921 Mammographic calcification found on diagnostic imaging of breast: Secondary | ICD-10-CM | POA: Insufficient documentation

## 2017-09-19 DIAGNOSIS — R928 Other abnormal and inconclusive findings on diagnostic imaging of breast: Secondary | ICD-10-CM | POA: Insufficient documentation

## 2017-10-03 ENCOUNTER — Encounter: Payer: Self-pay | Admitting: Internal Medicine

## 2017-10-03 ENCOUNTER — Ambulatory Visit (INDEPENDENT_AMBULATORY_CARE_PROVIDER_SITE_OTHER): Payer: 59 | Admitting: Internal Medicine

## 2017-10-03 VITALS — BP 110/70 | HR 89 | Resp 16 | Ht 61.0 in | Wt 147.0 lb

## 2017-10-03 DIAGNOSIS — J45909 Unspecified asthma, uncomplicated: Secondary | ICD-10-CM

## 2017-10-03 DIAGNOSIS — R1310 Dysphagia, unspecified: Secondary | ICD-10-CM
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
## 2017-10-03 NOTE — Patient Instructions (Addendum)
Continue with ventolin as needed.  Will refer you to ENT for your throat problems, if nothing is found, call us back and we will refer you to a gastroenterologist.

## 2017-10-06 IMAGING — US US TRANSVAGINAL NON-OB
1 series · 14 of 25 positions shown · non-contrast
Comparison: None.

CLINICAL DATA: Vaginal and pelvic pain. Previous hysterectomy and
right oophorectomy.

EXAM:
TRANSABDOMINAL AND TRANSVAGINAL ULTRASOUND OF PELVIS
DOPPLER ULTRASOUND OF OVARIES
TECHNIQUE: Both transabdominal and transvaginal ultrasound examinations of the
pelvis were performed. Transabdominal technique was performed for
global imaging of the pelvis.
It was necessary to proceed with endovaginal exam following the
transabdominal exam to visualize the left ovary. Color and duplex
Doppler ultrasound was utilized to evaluate blood flow to the left
ovary.

[Series 1: us transvaginal non-ob · 0.17mm/px · 14 of 37 slices shown]
[im 1/37]
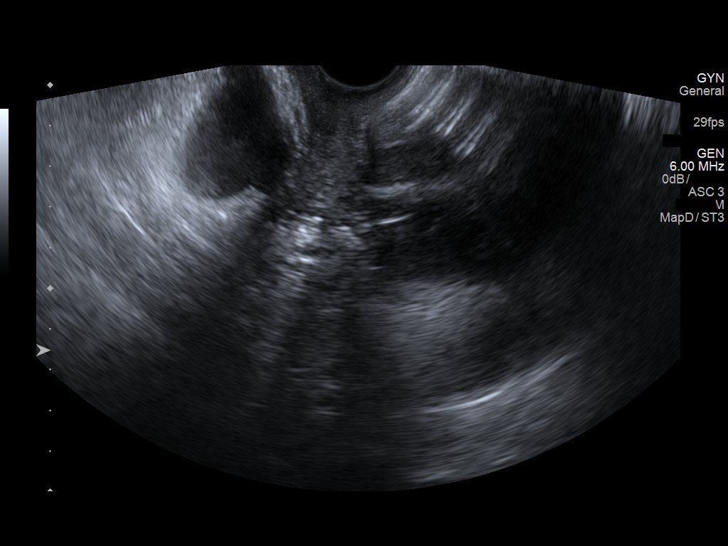
[im 4/37]
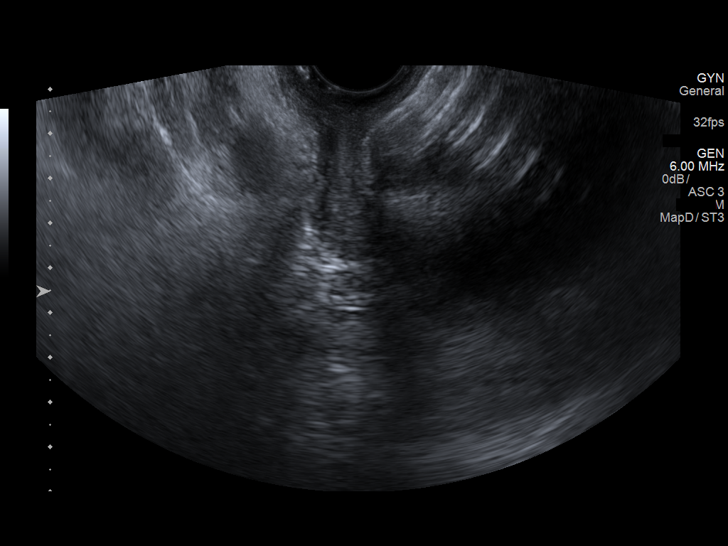
[im 7/37]
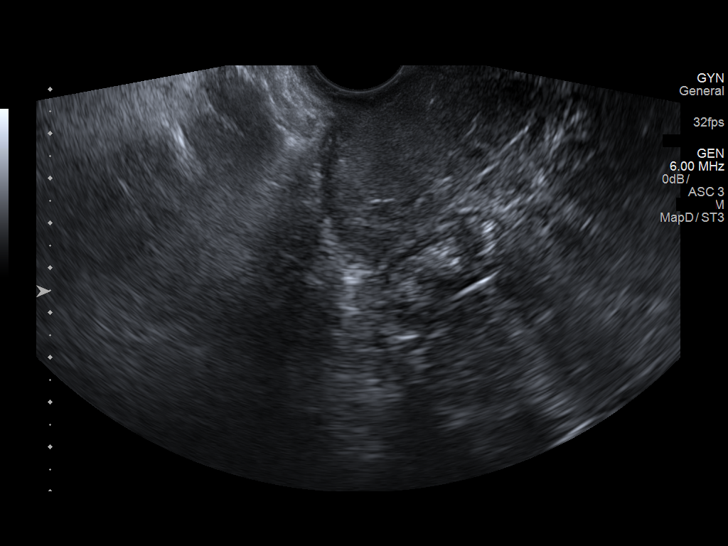
[im 10/37]
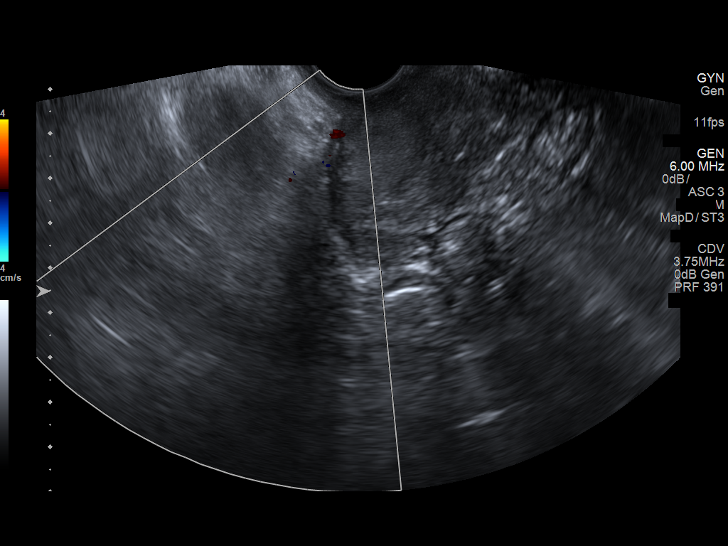
[im 13/37]
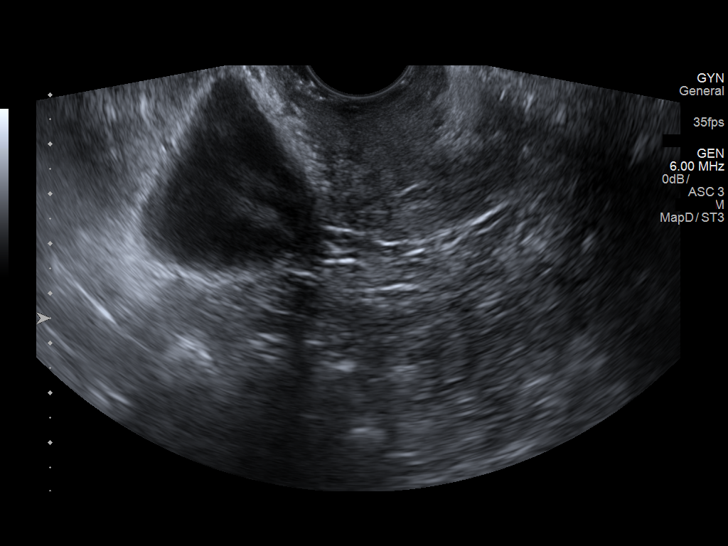
[im 14/37]
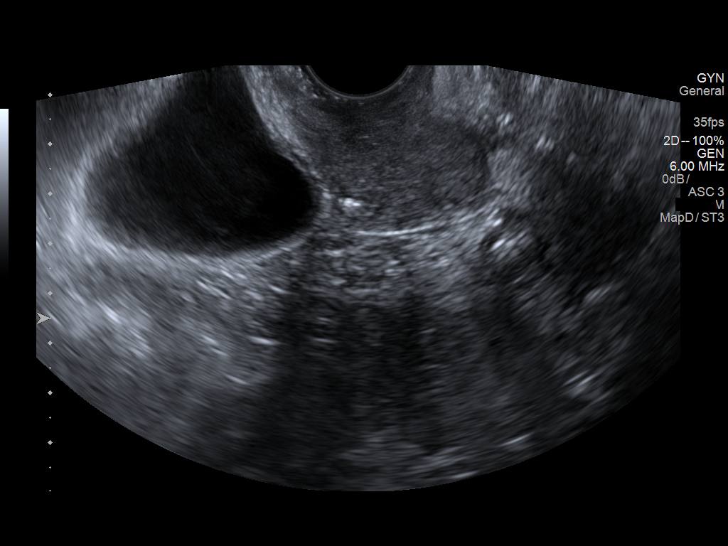
[im 17/37]
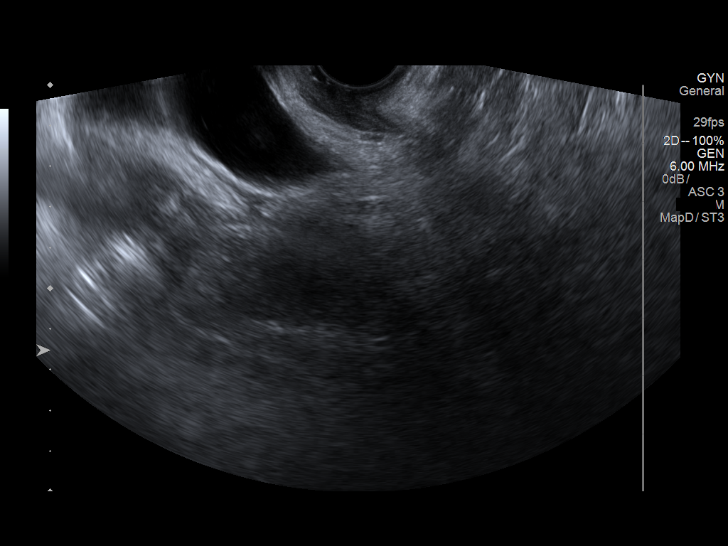
[im 20/37]
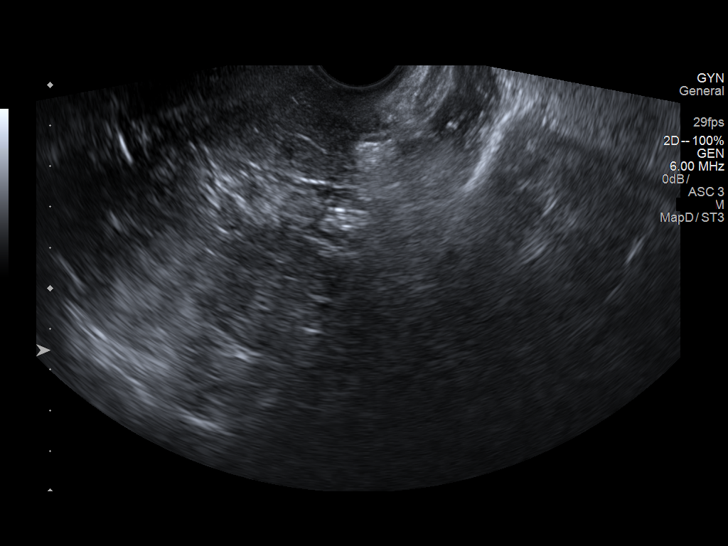
[im 23/37]
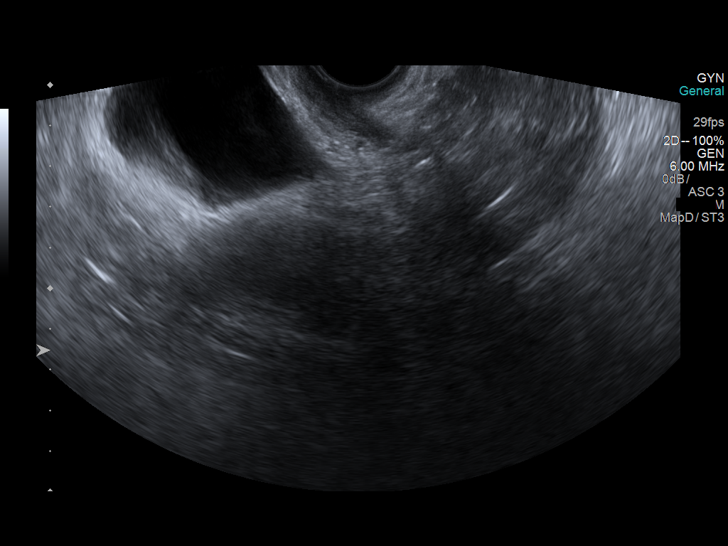
[im 25/37]
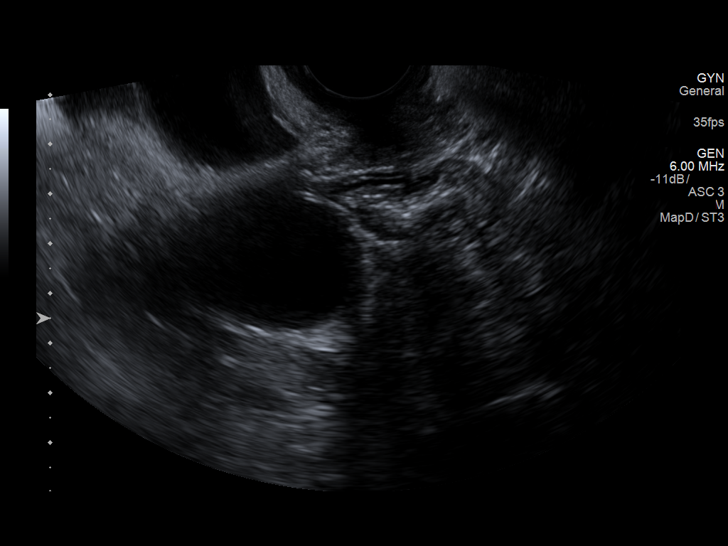
[im 28/37]
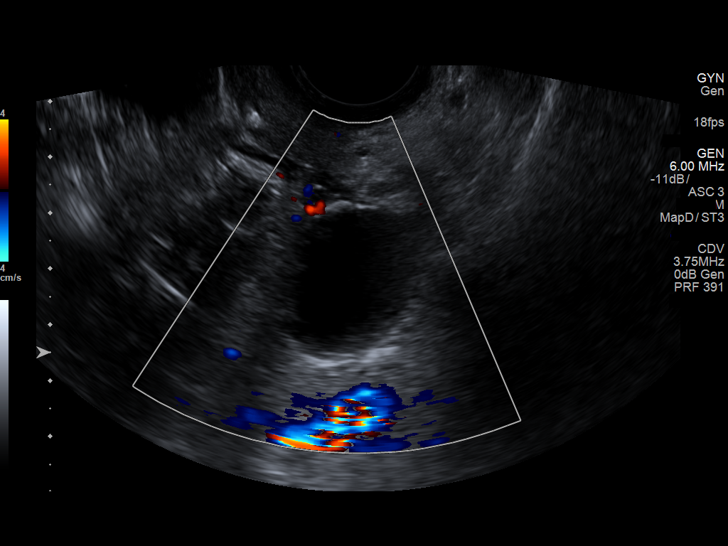
[im 31/37]
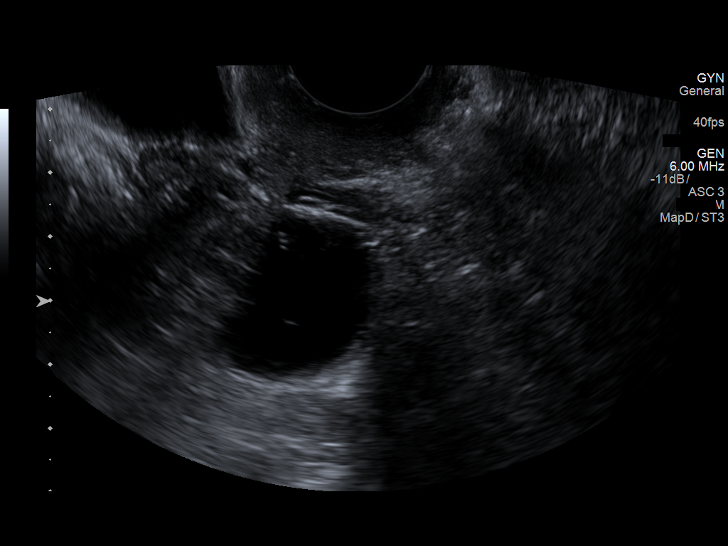
[im 34/37]
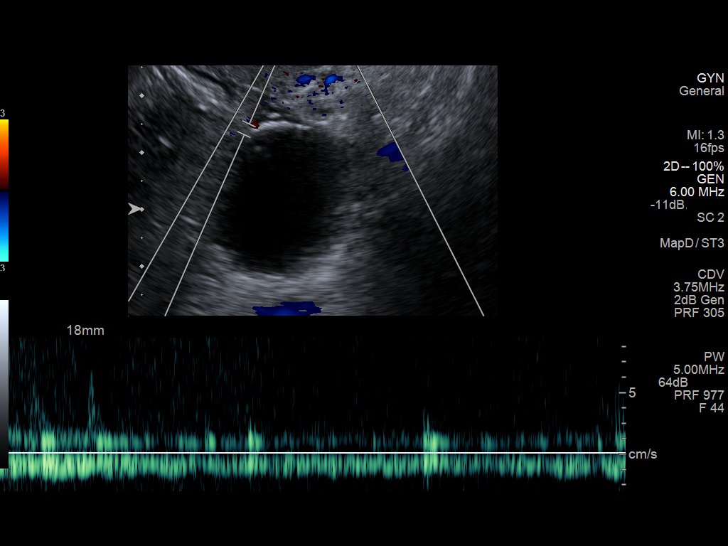
[im 37/37]
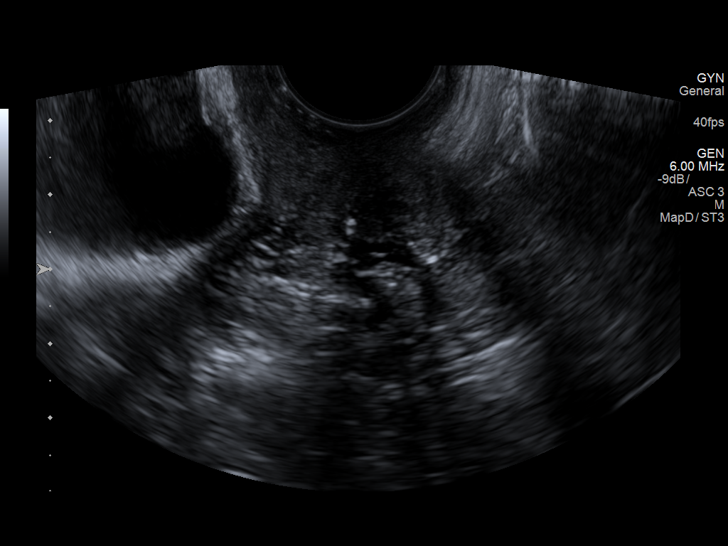

[14 of 25 positions shown; findings below may reference images not displayed]

FINDINGS: Uterus

Removed.

Left ovary

Measurements: 4.6 x 3.4 x 3.0 cm. There is a 3.1 x 2.7 x 2.4 cm cyst
on the left ovary. There appears to be a 3 mm nodule in the cyst.

Pulsed Doppler evaluation of the left ovary demonstrates normal
low-resistance arterial and venous waveforms.

Other findings

No abnormal free fluid.
IMPRESSION: Cyst on the left ovary. Normal perfusion. Tiny nodule in the cyst. I
recommend follow-up pelvic ultrasound in 6 months because of the
small nodule in the cyst.

Otherwise negative exam.

## 2017-10-21 ENCOUNTER — Other Ambulatory Visit: Payer: Self-pay | Admitting: Otolaryngology

## 2017-10-21 DIAGNOSIS — R1312 Dysphagia, oropharyngeal phase: Secondary | ICD-10-CM

## 2017-10-24 DIAGNOSIS — F325 Major depressive disorder, single episode, in full remission: Secondary | ICD-10-CM | POA: Insufficient documentation

## 2017-10-25 ENCOUNTER — Ambulatory Visit
Admission: RE | Admit: 2017-10-25 | Discharge: 2017-10-25 | Disposition: A | Discharge status: Home / Self Care | Payer: 59 | Source: Ambulatory Visit | Attending: Otolaryngology | Admitting: Otolaryngology

## 2017-10-25 DIAGNOSIS — K449 Diaphragmatic hernia without obstruction or gangrene: Secondary | ICD-10-CM | POA: Diagnosis not present

## 2017-10-25 DIAGNOSIS — K219 Gastro-esophageal reflux disease without esophagitis: Secondary | ICD-10-CM | POA: Insufficient documentation

## 2017-10-25 DIAGNOSIS — R1312 Dysphagia, oropharyngeal phase: Secondary | ICD-10-CM | POA: Diagnosis not present

## 2017-11-21 DIAGNOSIS — I10 Essential (primary) hypertension: Secondary | ICD-10-CM | POA: Insufficient documentation

## 2017-11-21 DIAGNOSIS — F330 Major depressive disorder, recurrent, mild: Secondary | ICD-10-CM | POA: Insufficient documentation

## 2017-11-25 HISTORY — PX: ESOPHAGOGASTRODUODENOSCOPY: SHX1529

## 2017-11-27 ENCOUNTER — Ambulatory Visit
Admission: RE | Admit: 2017-11-27 | Discharge: 2017-11-27 | Disposition: A | Discharge status: Home / Self Care | Payer: 59 | Source: Ambulatory Visit | Attending: Internal Medicine | Admitting: Internal Medicine

## 2017-11-27 ENCOUNTER — Other Ambulatory Visit: Payer: Self-pay | Admitting: Internal Medicine

## 2017-11-27 DIAGNOSIS — K223 Perforation of esophagus: Secondary | ICD-10-CM

## 2017-11-27 MED ORDER — IOPAMIDOL (ISOVUE-300) INJECTION 61%
50.0000 mL | Freq: Once | INTRAVENOUS | Status: AC | PRN
  Administered 2017-11-27: 50 mL via ORAL

## 2017-11-28 ENCOUNTER — Ambulatory Visit: Payer: 59

## 2017-12-05 IMAGING — MG MM DIGITAL SCREENING BILAT W/ CAD
4 series · 4 of 4 positions shown · non-contrast
Comparison: Previous exam(s).

CLINICAL DATA: Screening.

EXAM:
DIGITAL SCREENING BILATERAL MAMMOGRAM WITH CAD

[L CC]
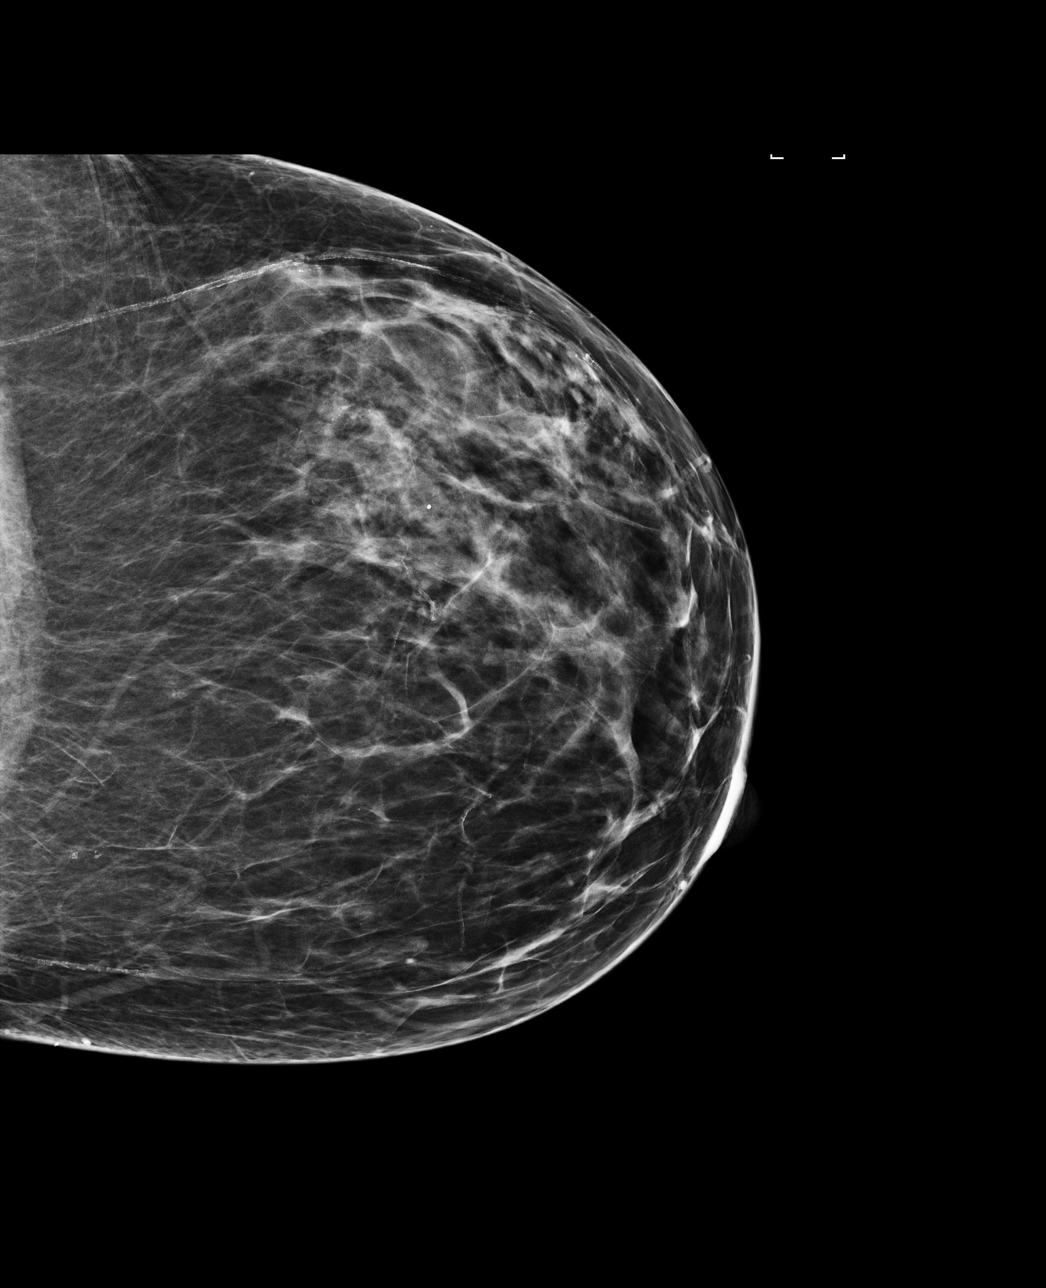

[L MLO]
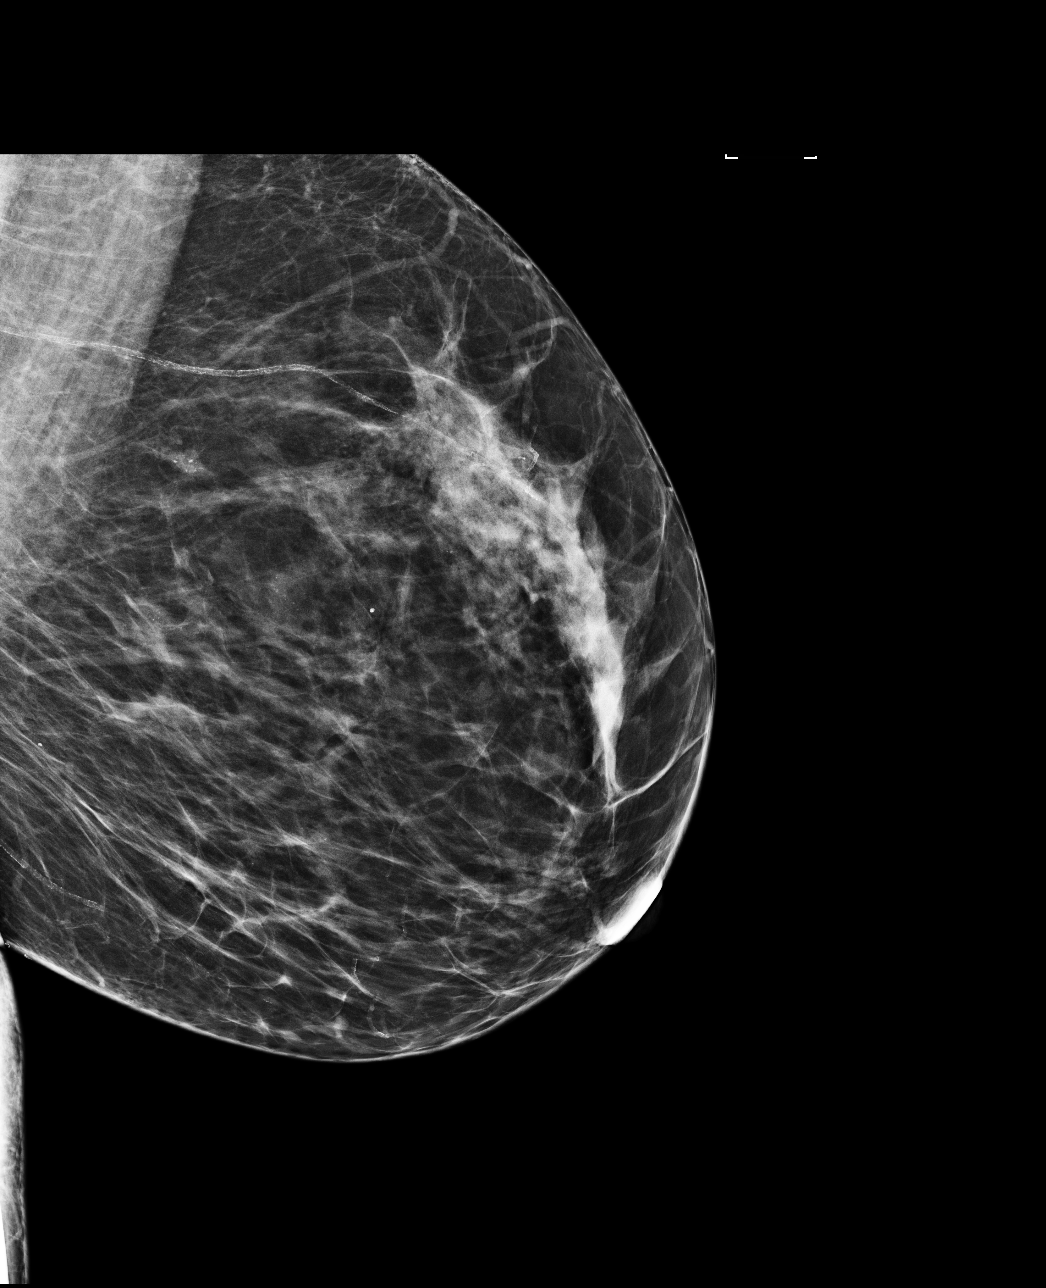

[R MLO]
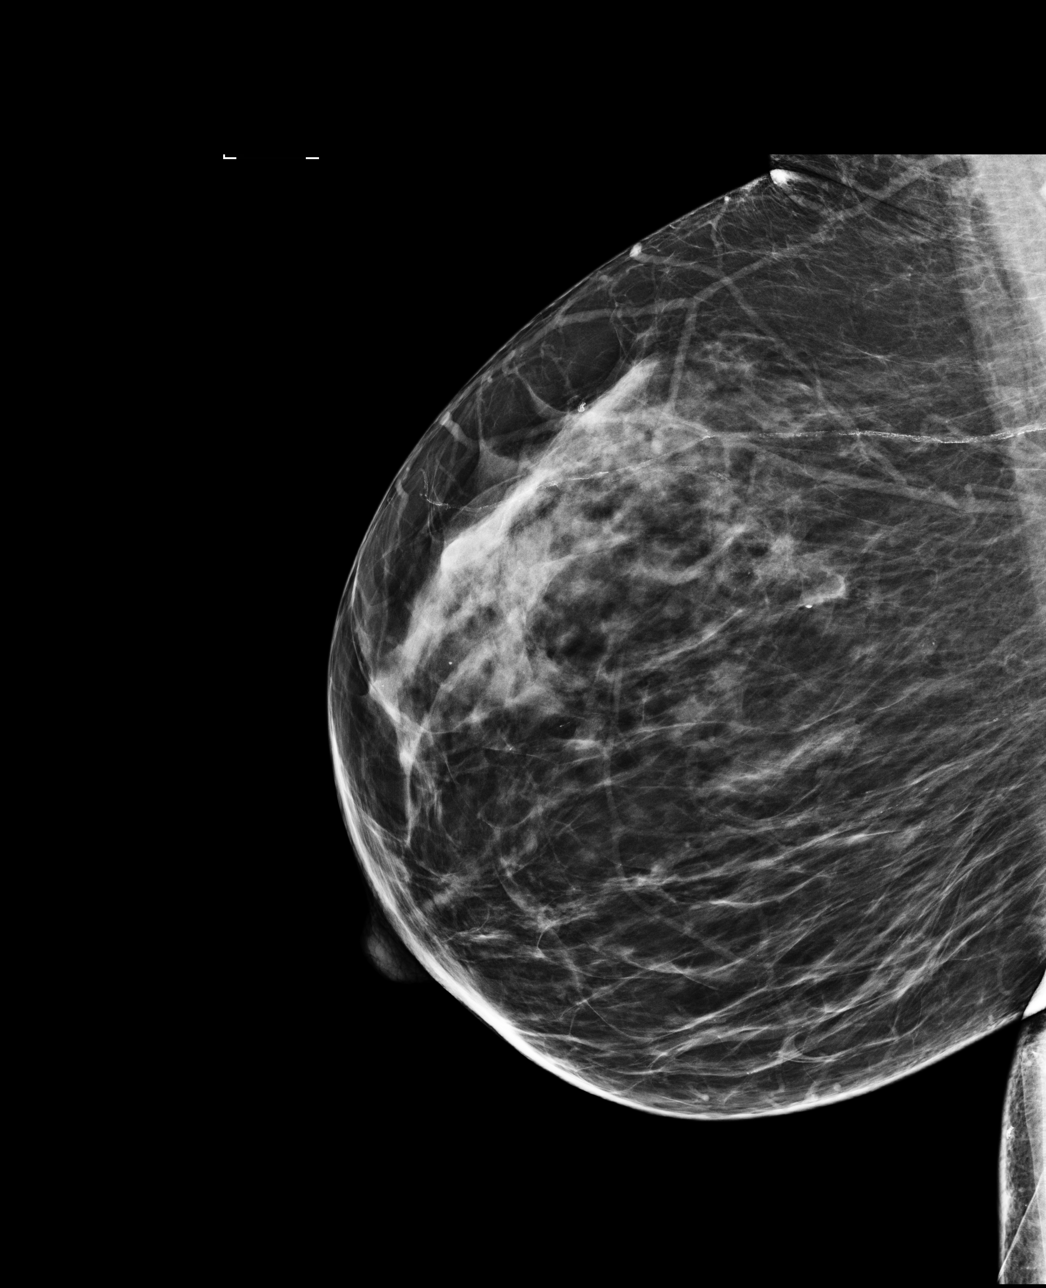

[R CC]
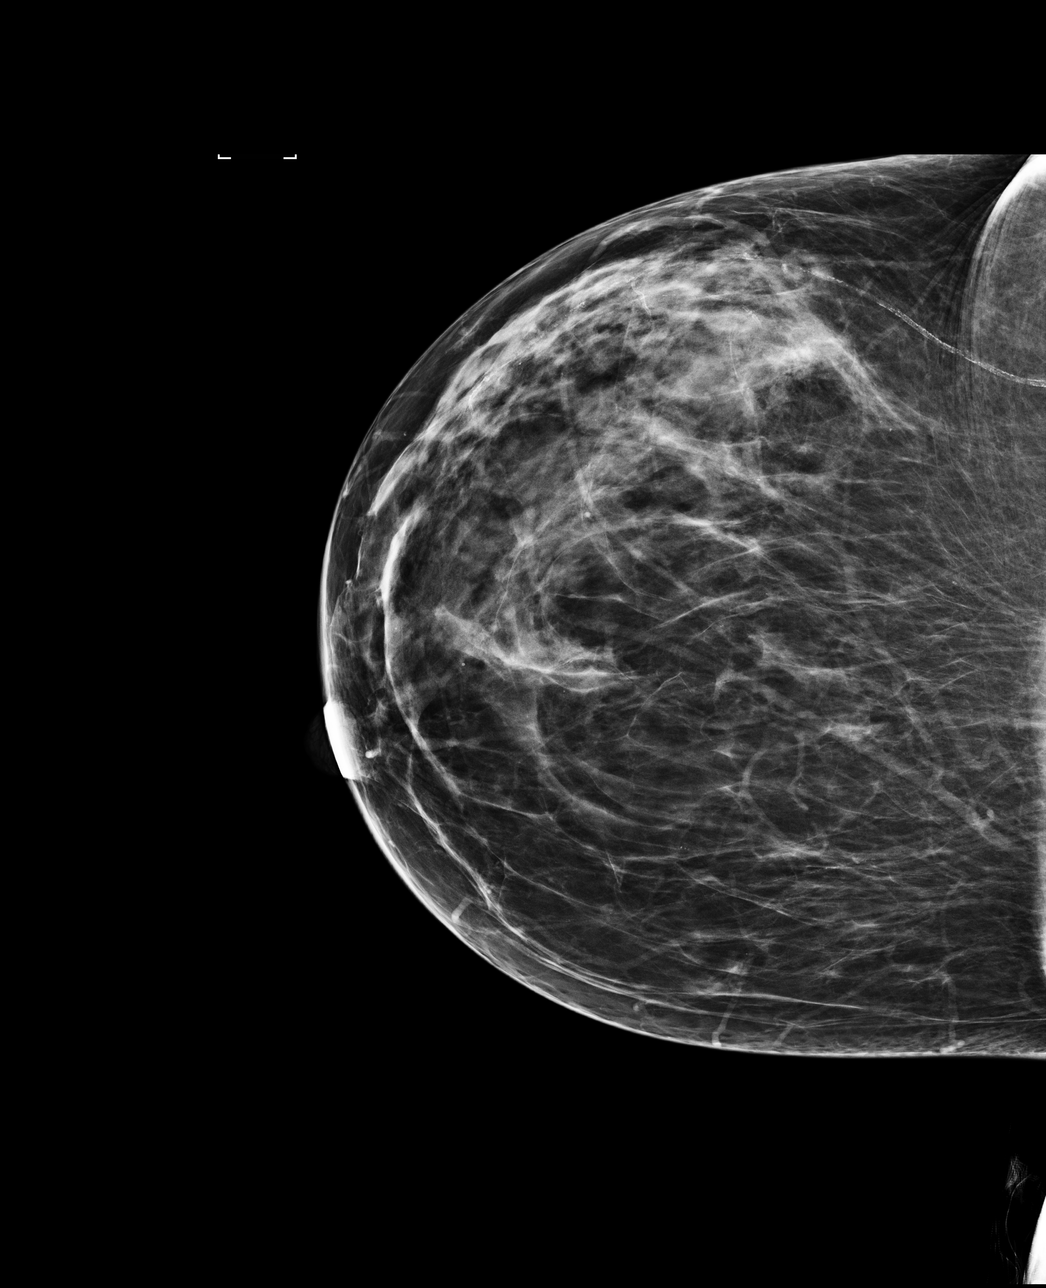

[4 of 4 positions shown; findings below may reference images not displayed]

ACR Breast Density Category b: There are scattered areas of
fibroglandular density.
FINDINGS: In the left breast, calcifications warrant further evaluation. In
the right breast, no findings suspicious for malignancy. Images were
processed with CAD.
IMPRESSION: Further evaluation is suggested for calcifications in the left
breast.

RECOMMENDATION:
Diagnostic mammogram of the left breast. (Code:[id number]-[id number])

The patient will be contacted regarding the findings, and additional
imaging will be scheduled.

BI-RADS CATEGORY  0: Incomplete. Need additional imaging evaluation
and/or prior mammograms for comparison.

## 2017-12-16 IMAGING — CT CT ABD-PELV W/ CM
2 of 5 series · 16 of 46 positions shown, 18 images · IV contrast (Omnipaque 300)
Comparison: 04/19/2015 abdominal ultrasound.  Prior CT 11/02/2011

CLINICAL DATA: Low pelvic pain, centered in the vagina, since 0620
today. History COPD. Hypertension. Gastroesophageal reflux disease.

EXAM:
CT ABDOMEN AND PELVIS WITH CONTRAST
TECHNIQUE: Multidetector CT imaging of the abdomen and pelvis was performed
using the standard protocol following bolus administration of
intravenous contrast.
CONTRAST:  100mL OMNIPAQUE IOHEXOL 300 MG/ML  SOLN

[Series 2: abd_pel_with 5.0 b40f · axial · 0.72mm/px · z∈[-398,-14]mm · 13 of 87 slices shown, 15 images]
[im 5/87  soft-tissue]
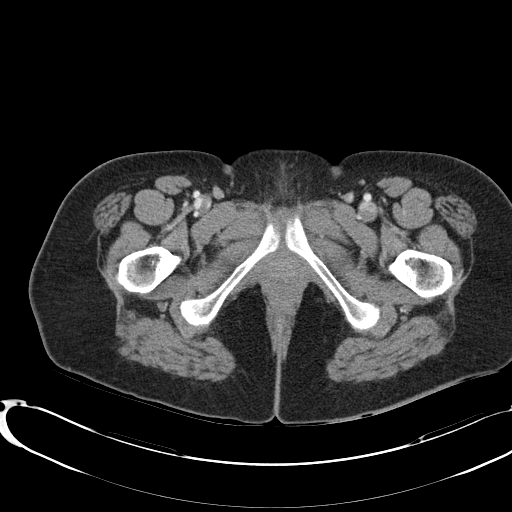
[im 5/87  bone]
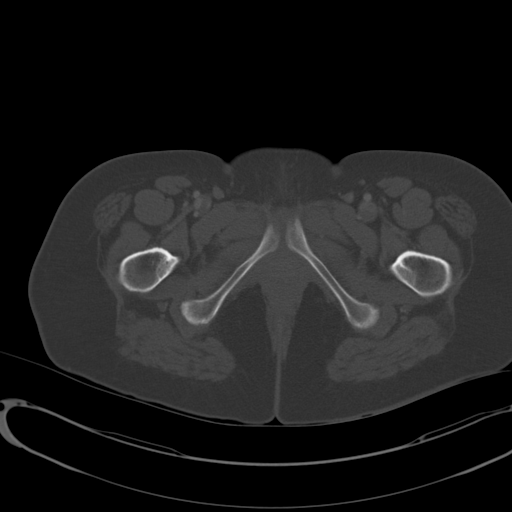
[im 10/87  soft-tissue]
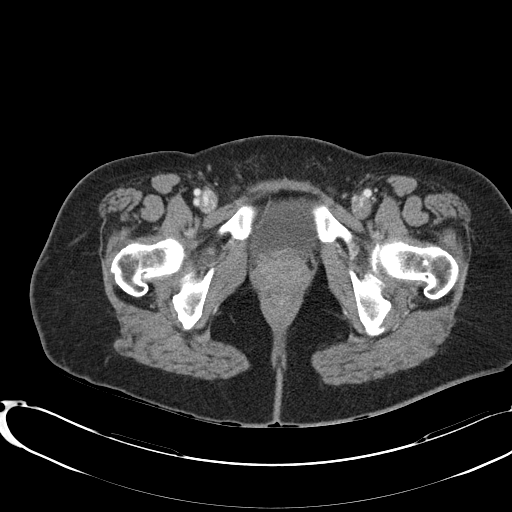
[im 20/87  soft-tissue]
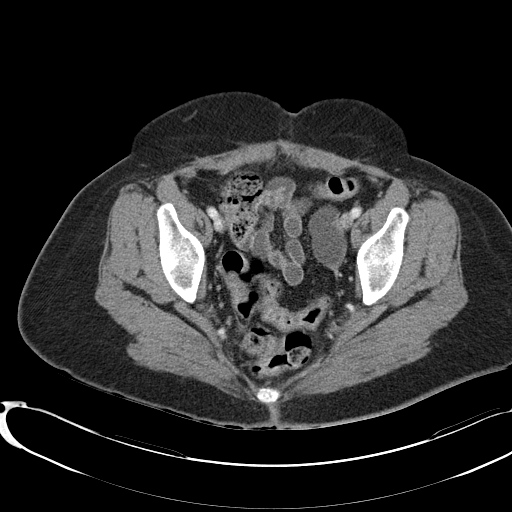
[im 24/87  soft-tissue]
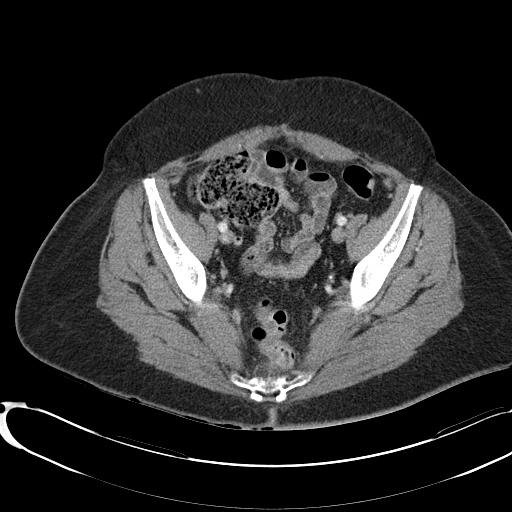
[im 29/87  soft-tissue]
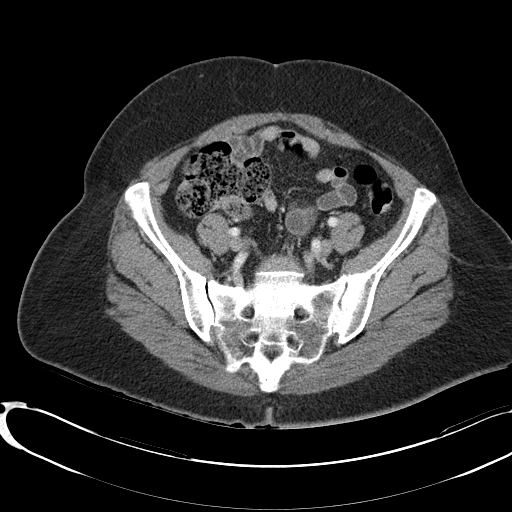
[im 39/87  soft-tissue]
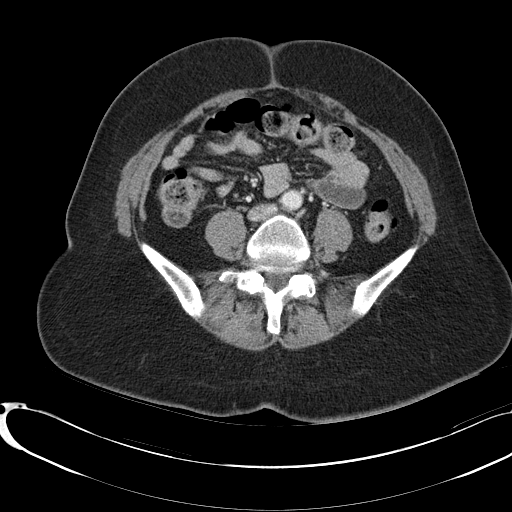
[im 44/87  soft-tissue]
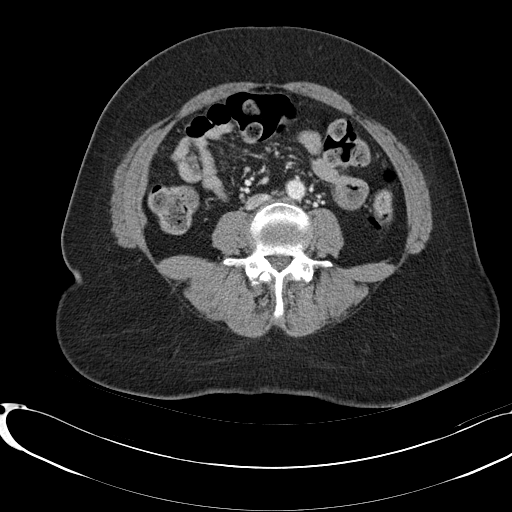
[im 48/87  soft-tissue]
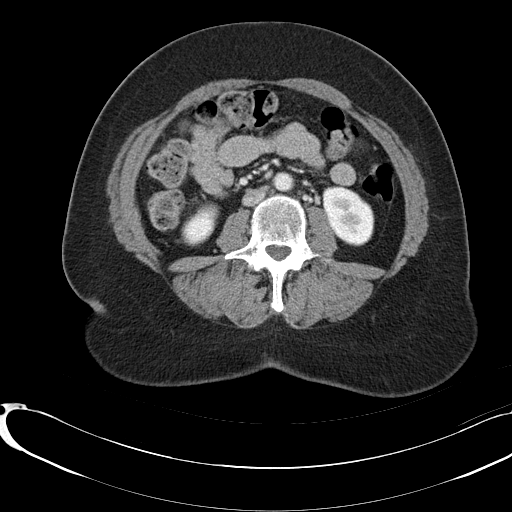
[im 58/87  soft-tissue]
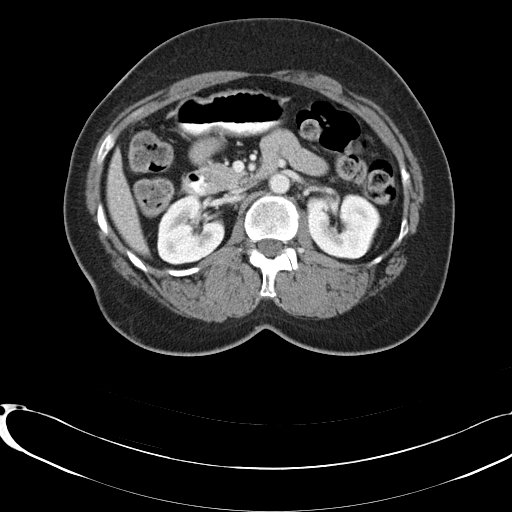
[im 58/87  bone]
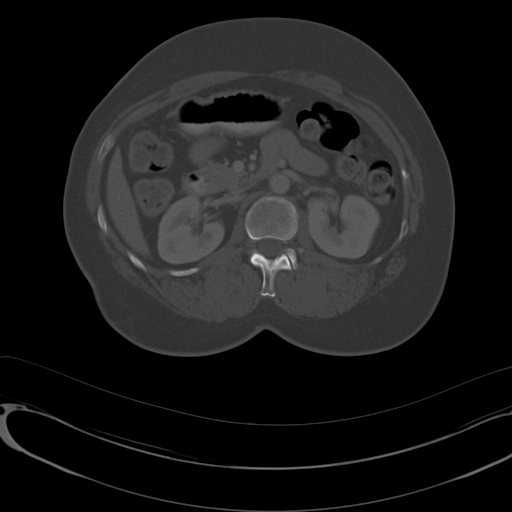
[im 63/87  soft-tissue]
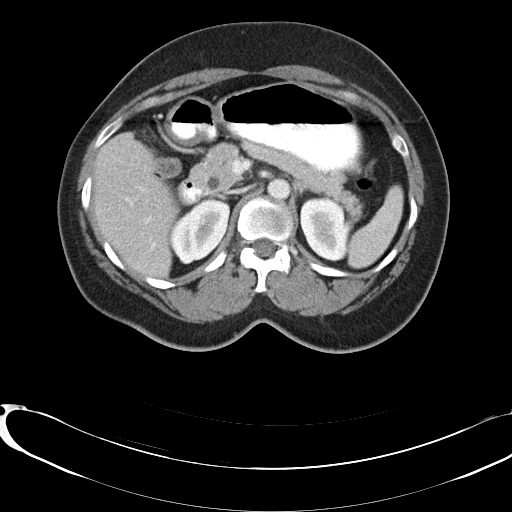
[im 67/87  soft-tissue]
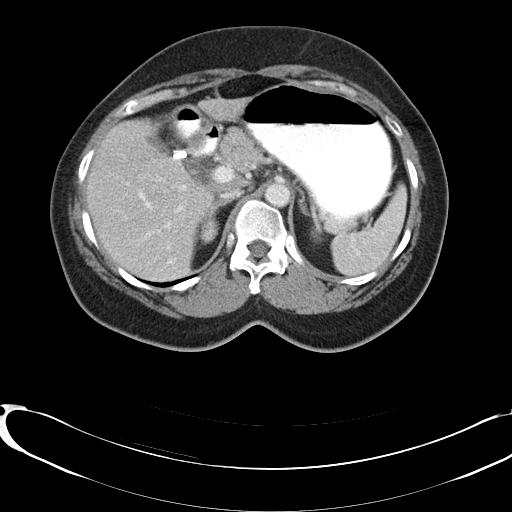
[im 77/87  soft-tissue]
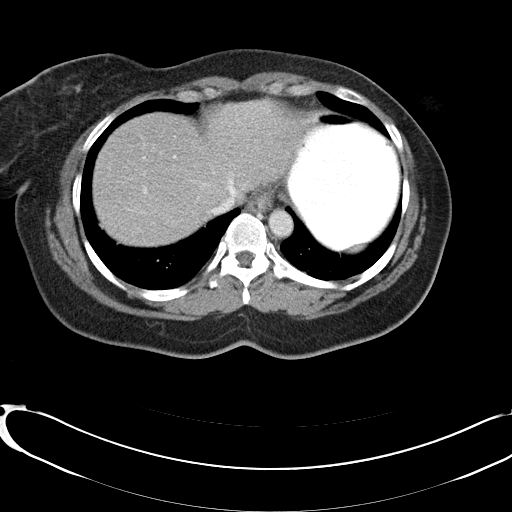
[im 82/87  soft-tissue]
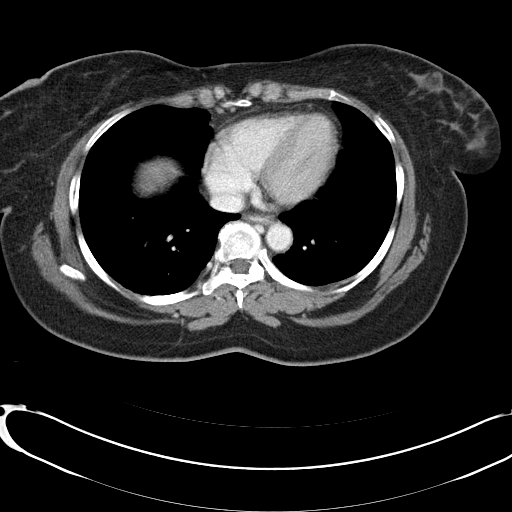

[Series 4: abd_pel_with 3.0 spo cor · coronal · 0.74mm/px · 3 of 71 slices shown]
[im 24/71  soft-tissue]
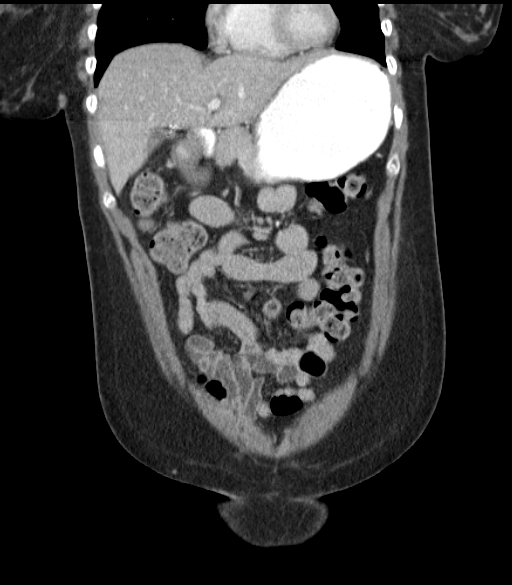
[im 32/71  soft-tissue]
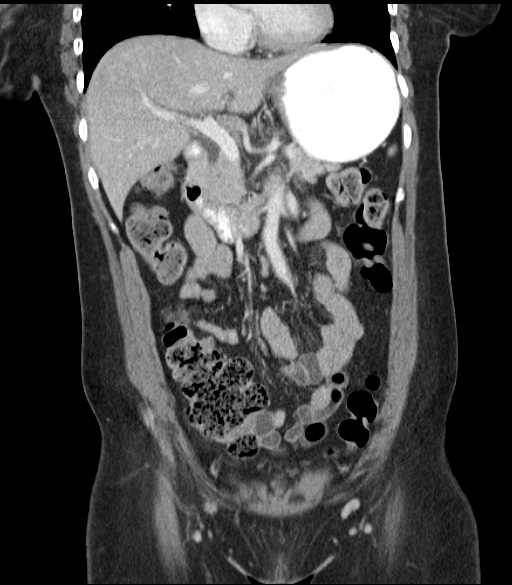
[im 39/71  soft-tissue]
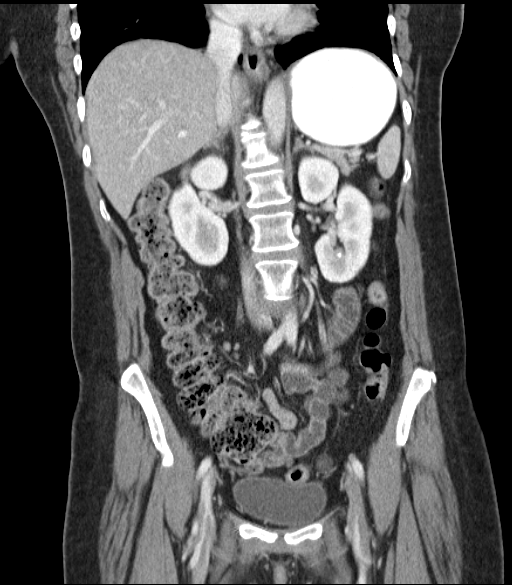

[16 of 46 positions shown; findings below may reference images not displayed]

FINDINGS: Lower chest: Bibasilar atelectasis or scar. Normal heart size
without pericardial or pleural effusion.

Hepatobiliary: Interval cholecystectomy, without postoperative fluid
collection or biliary duct dilatation.

Pancreas: Normal, without mass or ductal dilatation.

Spleen: Normal in size, without focal abnormality.

Adrenals/Urinary Tract: Normal adrenal glands. Lower pole 5 mm left
renal collecting system calculus. Normal right kidney, without
hydronephrosis or hydroureter. Normal urinary bladder.

Stomach/Bowel: Normal stomach, without wall thickening. Colonic
stool burden suggests constipation. Normal terminal ileum and
appendix. Normal small bowel.

Vascular/Lymphatic: Normal caliber of the aorta and branch vessels.
No abdominopelvic adenopathy.

Reproductive: Hysterectomy. Cystic lesion within the left adnexal
measures 4.5 x 2.7 cm. Similar to decreased since 11/02/2011.

Other: No significant free fluid.

Musculoskeletal: Right iliac bone island.
IMPRESSION: 1.  No acute process in the abdomen or pelvis.
2.  Possible constipation.
3. Cystic lesion within the left adnexa is similar to decreased in
size and similar in morphology compared to 11/02/11, most consistent
with a benign etiology.
4. Left nephrolithiasis.

## 2018-01-01 IMAGING — MG MM DIGITAL DIAGNOSTIC UNILAT*L*
4 series · 4 of 4 positions shown · non-contrast
Comparison: Previous exam(s).

CLINICAL DATA: Recall from screening mammography.

EXAM:
DIGITAL DIAGNOSTIC LEFT MAMMOGRAM WITH 3D TOMOSYNTHESIS

[L ML (1 of 2)]
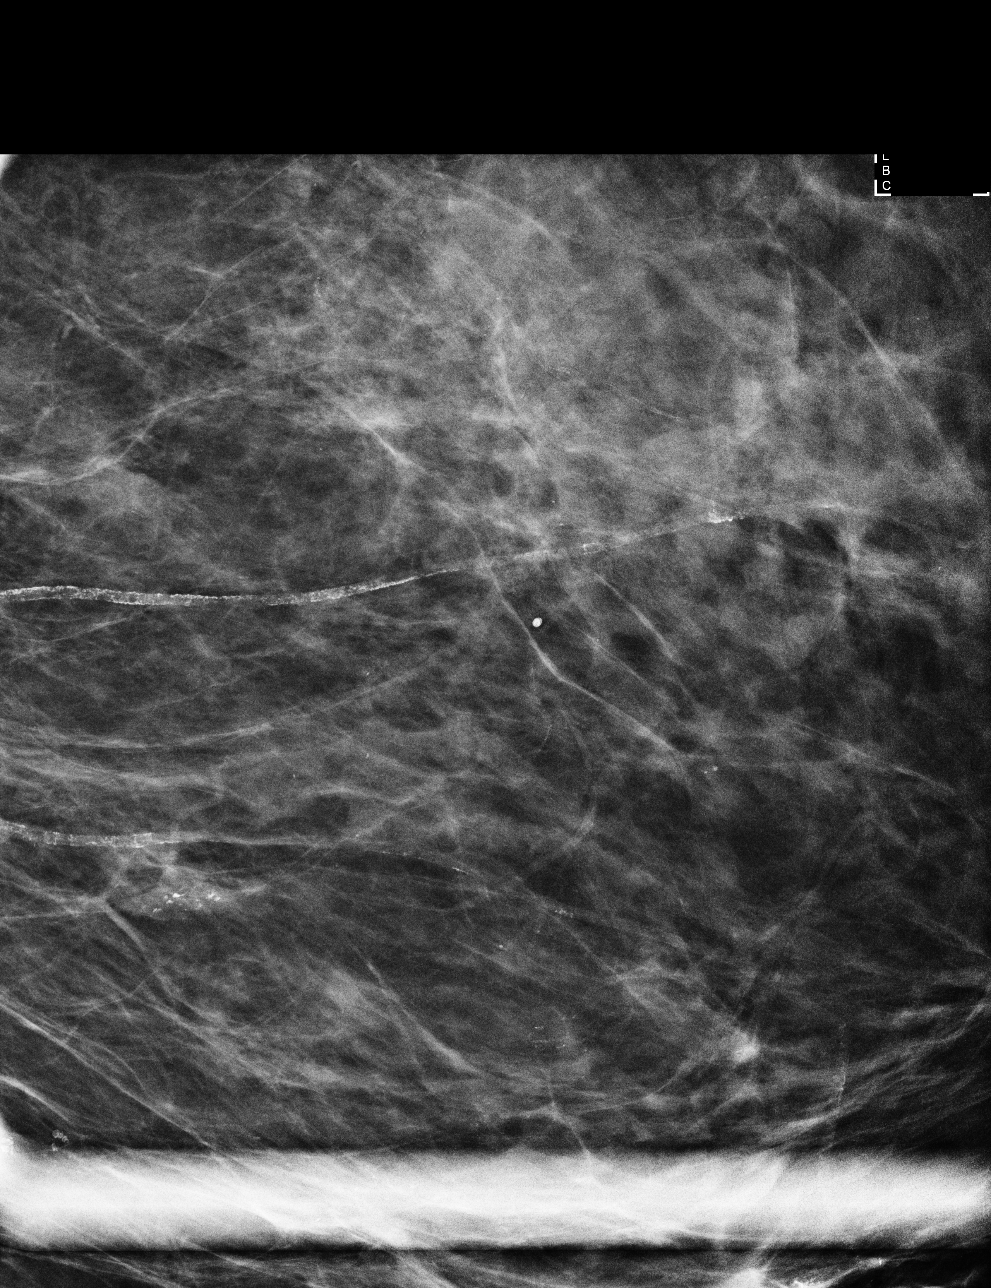

[L ML (2 of 2)]
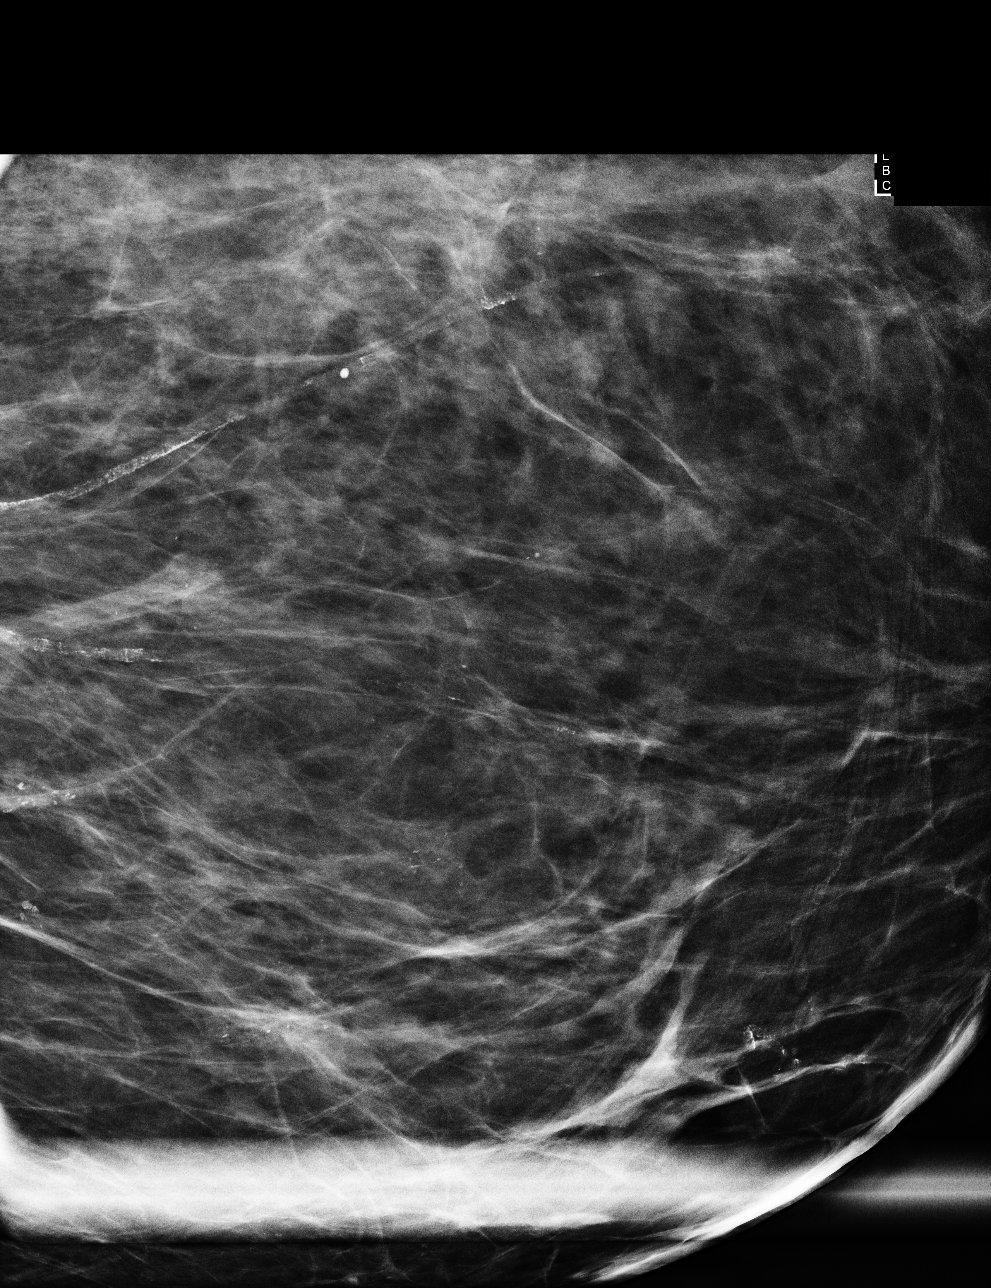

[L CC (1 of 2)]
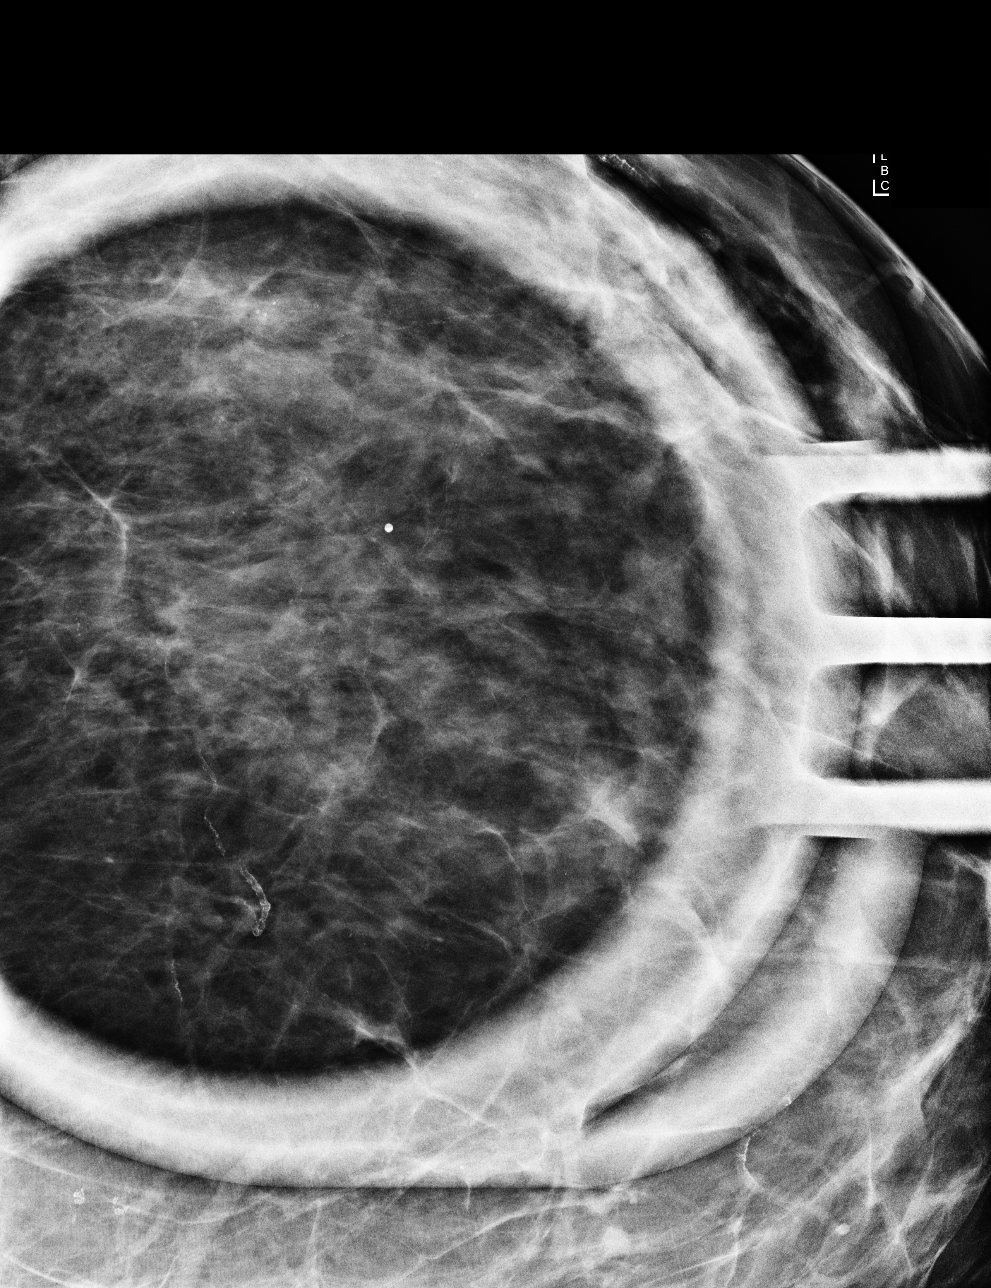

[L CC (2 of 2)]
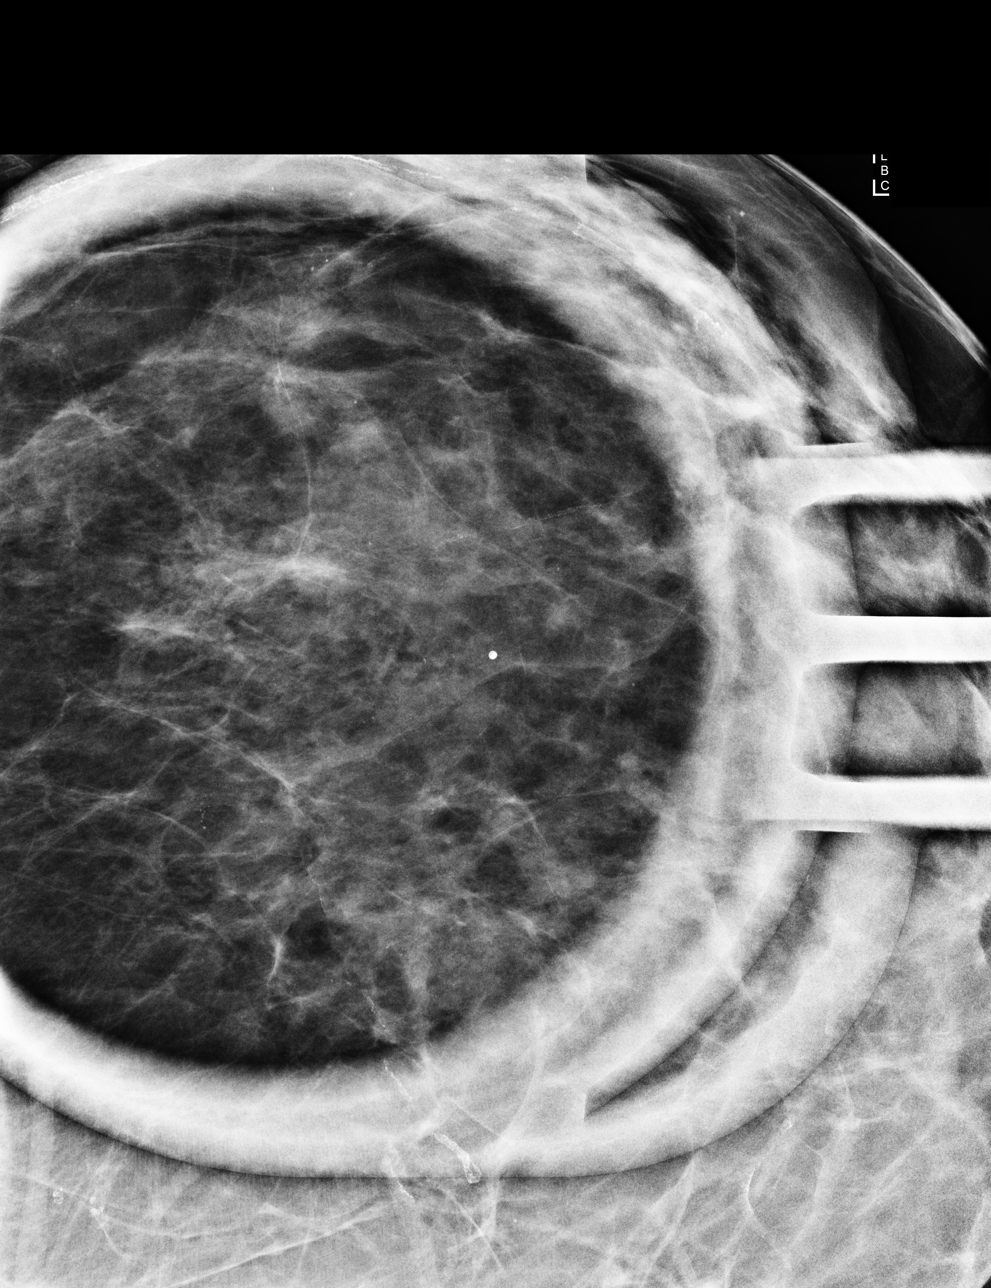

[4 of 4 positions shown; findings below may reference images not displayed]

ACR Breast Density Category b: There are scattered areas of
fibroglandular density.
FINDINGS: Magnification views of the left breast demonstrate will scattered
groups of punctate calcifications to be present centrally and also
inferiorly within the left breast. Many of these calcifications
layer on the erect lateral view consistent with milk of calcium.
There are scattered punctate calcifications also present within the
right breast. There are no worrisome linear or branching forms.
IMPRESSION: Numerous scattered groups of punctate calcifications present
centrally and also inferiorly within the left breast. Many of these
calcifications layer consistent with milk of calcium. I recommend
followup left breast diagnostic mammogram in 6 months.

RECOMMENDATION:
Left breast diagnostic mammogram in 6 months.

I have discussed the findings and recommendations with the patient.
Results were also provided in writing at the conclusion of the
visit. If applicable, a reminder letter will be sent to the patient
regarding the next appointment.

BI-RADS CATEGORY  3: Probably benign.

## 2018-03-10 DIAGNOSIS — R682 Dry mouth, unspecified: Secondary | ICD-10-CM | POA: Diagnosis not present

## 2018-03-19 ENCOUNTER — Other Ambulatory Visit: Payer: Self-pay | Admitting: Physician Assistant

## 2018-03-19 DIAGNOSIS — R1310 Dysphagia, unspecified: Secondary | ICD-10-CM

## 2018-03-24 ENCOUNTER — Encounter: Payer: Self-pay | Admitting: Student in an Organized Health Care Education/Training Program

## 2018-03-24 ENCOUNTER — Ambulatory Visit
Payer: 59 | Attending: Student in an Organized Health Care Education/Training Program | Admitting: Student in an Organized Health Care Education/Training Program

## 2018-03-24 VITALS — BP 126/91 | HR 98 | Temp 98.0°F | Resp 16 | Ht 61.0 in | Wt 149.0 lb

## 2018-03-24 DIAGNOSIS — M6281 Muscle weakness (generalized): Secondary | ICD-10-CM | POA: Insufficient documentation

## 2018-03-24 DIAGNOSIS — Z981 Arthrodesis status: Secondary | ICD-10-CM | POA: Diagnosis not present

## 2018-03-24 DIAGNOSIS — F329 Major depressive disorder, single episode, unspecified: Secondary | ICD-10-CM | POA: Insufficient documentation

## 2018-03-24 DIAGNOSIS — K219 Gastro-esophageal reflux disease without esophagitis: Secondary | ICD-10-CM | POA: Diagnosis not present

## 2018-03-24 DIAGNOSIS — M542 Cervicalgia: Secondary | ICD-10-CM | POA: Diagnosis present

## 2018-03-24 DIAGNOSIS — F419 Anxiety disorder, unspecified: Secondary | ICD-10-CM | POA: Diagnosis not present

## 2018-03-24 DIAGNOSIS — M5030 Other cervical disc degeneration, unspecified cervical region: Secondary | ICD-10-CM | POA: Insufficient documentation

## 2018-03-24 DIAGNOSIS — Z91013 Allergy to seafood: Secondary | ICD-10-CM | POA: Diagnosis not present

## 2018-03-24 DIAGNOSIS — R413 Other amnesia: Secondary | ICD-10-CM | POA: Diagnosis not present

## 2018-03-24 DIAGNOSIS — M5136 Other intervertebral disc degeneration, lumbar region: Secondary | ICD-10-CM | POA: Insufficient documentation

## 2018-03-24 DIAGNOSIS — Z79899 Other long term (current) drug therapy: Secondary | ICD-10-CM | POA: Insufficient documentation

## 2018-03-24 DIAGNOSIS — M79641 Pain in right hand: Secondary | ICD-10-CM | POA: Insufficient documentation

## 2018-03-24 DIAGNOSIS — Q761 Klippel-Feil syndrome: Secondary | ICD-10-CM | POA: Diagnosis not present

## 2018-03-24 DIAGNOSIS — R200 Anesthesia of skin: Secondary | ICD-10-CM | POA: Insufficient documentation

## 2018-03-24 DIAGNOSIS — Z91010 Allergy to peanuts: Secondary | ICD-10-CM | POA: Diagnosis not present

## 2018-03-24 DIAGNOSIS — Z90710 Acquired absence of both cervix and uterus: Secondary | ICD-10-CM | POA: Insufficient documentation

## 2018-03-24 DIAGNOSIS — I10 Essential (primary) hypertension: Secondary | ICD-10-CM | POA: Diagnosis not present

## 2018-03-24 DIAGNOSIS — M47812 Spondylosis without myelopathy or radiculopathy, cervical region: Secondary | ICD-10-CM | POA: Diagnosis not present

## 2018-03-24 DIAGNOSIS — Z9049 Acquired absence of other specified parts of digestive tract: Secondary | ICD-10-CM | POA: Diagnosis not present

## 2018-03-24 DIAGNOSIS — Z9889 Other specified postprocedural states: Secondary | ICD-10-CM | POA: Diagnosis not present

## 2018-03-24 DIAGNOSIS — Z87442 Personal history of urinary calculi: Secondary | ICD-10-CM | POA: Diagnosis not present

## 2018-03-24 DIAGNOSIS — J45909 Unspecified asthma, uncomplicated: Secondary | ICD-10-CM | POA: Diagnosis not present

## 2018-03-24 DIAGNOSIS — M5481 Occipital neuralgia: Secondary | ICD-10-CM

## 2018-03-24 DIAGNOSIS — E559 Vitamin D deficiency, unspecified: Secondary | ICD-10-CM | POA: Insufficient documentation

## 2018-03-24 DIAGNOSIS — Z8701 Personal history of pneumonia (recurrent): Secondary | ICD-10-CM | POA: Diagnosis not present

## 2018-03-24 DIAGNOSIS — Z8249 Family history of ischemic heart disease and other diseases of the circulatory system: Secondary | ICD-10-CM | POA: Insufficient documentation

## 2018-03-24 DIAGNOSIS — R519 Headache, unspecified: Secondary | ICD-10-CM

## 2018-03-24 DIAGNOSIS — Z79891 Long term (current) use of opiate analgesic: Secondary | ICD-10-CM | POA: Insufficient documentation

## 2018-03-24 DIAGNOSIS — R202 Paresthesia of skin: Secondary | ICD-10-CM | POA: Insufficient documentation

## 2018-03-24 DIAGNOSIS — G894 Chronic pain syndrome: Secondary | ICD-10-CM | POA: Insufficient documentation

## 2018-03-24 DIAGNOSIS — M79642 Pain in left hand: Secondary | ICD-10-CM | POA: Insufficient documentation

## 2018-03-24 DIAGNOSIS — R51 Headache: Secondary | ICD-10-CM | POA: Insufficient documentation

## 2018-03-24 DIAGNOSIS — E039 Hypothyroidism, unspecified: Secondary | ICD-10-CM | POA: Diagnosis not present

## 2018-03-24 DIAGNOSIS — Z9181 History of falling: Secondary | ICD-10-CM | POA: Insufficient documentation

## 2018-03-24 DIAGNOSIS — Z885 Allergy status to narcotic agent status: Secondary | ICD-10-CM | POA: Diagnosis not present

## 2018-03-24 MED ORDER — DULOXETINE HCL 30 MG PO CPEP: ORAL_CAPSULE | ORAL | 0 refills | Status: DC

## 2018-03-24 NOTE — Progress Notes (Signed)
Safety precautions to be maintained throughout the outpatient stay will include: orient to surroundings, keep bed in low position, maintain call bell within reach at all times, provide assistance with transfer out of bed and ambulation.  
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
## 2018-03-25 ENCOUNTER — Telehealth: Payer: Self-pay

## 2018-03-25 NOTE — Telephone Encounter (Signed)
Discussed with Dr. Holley Raring. Pateint is to continue with Cymbalta for 2 more days. If diarrhea persist then stop. Inform us if you have to stop or have any other issues. Patient understands instructions.

## 2018-03-25 NOTE — Telephone Encounter (Signed)
Pt was seen yesterday and given medications(RX) Pt states she was up all night with diarrhea , Do we want her to continue med that is once a day or change to something different, Please call husbands # [phone], Her phone is acting up.

## 2018-03-26 ENCOUNTER — Ambulatory Visit
Admission: RE | Admit: 2018-03-26 | Discharge: 2018-03-26 | Disposition: A | Discharge status: Home / Self Care | Payer: 59 | Source: Ambulatory Visit | Attending: Physician Assistant | Admitting: Physician Assistant

## 2018-03-26 ENCOUNTER — Telehealth: Payer: Self-pay | Admitting: Student in an Organized Health Care Education/Training Program

## 2018-03-26 DIAGNOSIS — R1310 Dysphagia, unspecified: Secondary | ICD-10-CM | POA: Diagnosis not present

## 2018-03-26 DIAGNOSIS — Z981 Arthrodesis status: Secondary | ICD-10-CM | POA: Insufficient documentation

## 2018-03-26 LAB — POCT I-STAT CREATININE: Creatinine, Ser: 0.8 mg/dL (ref 0.44–1.00)

## 2018-03-26 MED ORDER — IOPAMIDOL (ISOVUE-300) INJECTION 61%
75.0000 mL | Freq: Once | INTRAVENOUS | Status: AC | PRN
  Administered 2018-03-26: 75 mL via INTRAVENOUS

## 2018-03-26 NOTE — Telephone Encounter (Signed)
Attempted to return call, message left. 

## 2018-03-26 NOTE — Telephone Encounter (Signed)
Patient lvmail stating she is having severe stomach pain and severe diarrhea, her bottom is raw. Can she get different meds? Please call asap

## 2018-03-27 ENCOUNTER — Telehealth: Payer: Self-pay

## 2018-03-27 LAB — COMPLIANCE DRUG ANALYSIS, UR

## 2018-03-27 NOTE — Telephone Encounter (Signed)
Pt called and she continues to have stomach cramping with diarrhea after taking meds also makes her nauseated, pt states cramping and diarrhea is pretty severe.

## 2018-03-27 NOTE — Telephone Encounter (Signed)
Spoke with patient. She states diarrhea continued for 2 days.  INstructed patient not to take it tomorrow and to call us if diarrhea subsides.

## 2018-03-27 NOTE — Telephone Encounter (Signed)
Dr Holley Raring, patient was instructed when she called a couple of days ago to continue cymbalta for 2 more days.  She calls back today and says that the diarrhea has continued.  She took it this morning.  I instructed her to stop taking it to see if diarrhea subsides.  She is going to call us back Monday Morning to let us know.  She just started this medication and is pretty sure that it is the cause for the diarrhea.  If you have any other orders for her, let me know.  Thank you

## 2018-03-28 NOTE — Telephone Encounter (Signed)
Attempted to call patient to check on her diarrhea.  Left message stating to stop Cymbalta if diarrhea has continued as per Dr Holley Raring order.

## 2018-03-31 ENCOUNTER — Encounter: Payer: Self-pay | Admitting: Emergency Medicine

## 2018-03-31 ENCOUNTER — Other Ambulatory Visit: Payer: Self-pay

## 2018-03-31 ENCOUNTER — Emergency Department
Admission: EM | Admit: 2018-03-31 | Discharge: 2018-03-31 | Disposition: A | Discharge status: Home / Self Care | Payer: 59 | Attending: Emergency Medicine | Admitting: Emergency Medicine

## 2018-03-31 DIAGNOSIS — M542 Cervicalgia: Secondary | ICD-10-CM | POA: Diagnosis present

## 2018-03-31 DIAGNOSIS — I10 Essential (primary) hypertension: Secondary | ICD-10-CM | POA: Diagnosis not present

## 2018-03-31 DIAGNOSIS — E039 Hypothyroidism, unspecified: Secondary | ICD-10-CM | POA: Diagnosis not present

## 2018-03-31 DIAGNOSIS — M7918 Myalgia, other site: Secondary | ICD-10-CM | POA: Insufficient documentation

## 2018-03-31 DIAGNOSIS — Z79899 Other long term (current) drug therapy: Secondary | ICD-10-CM | POA: Diagnosis not present

## 2018-03-31 DIAGNOSIS — J45909 Unspecified asthma, uncomplicated: Secondary | ICD-10-CM | POA: Diagnosis not present

## 2018-03-31 MED ORDER — METAXALONE 800 MG PO TABS: 800.0000 mg | ORAL_TABLET | Freq: Three times a day (TID) | ORAL | 0 refills | Status: AC

## 2018-03-31 MED ORDER — KETOROLAC TROMETHAMINE 30 MG/ML IJ SOLN
30.0000 mg | Freq: Once | INTRAMUSCULAR | Status: AC
  Administered 2018-03-31: 30 mg via INTRAMUSCULAR
  Filled 2018-03-31: qty 1

## 2018-03-31 MED ORDER — KETOROLAC TROMETHAMINE 10 MG PO TABS: 10.0000 mg | ORAL_TABLET | Freq: Three times a day (TID) | ORAL | 0 refills | Status: DC
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
## 2018-04-01 ENCOUNTER — Telehealth: Payer: Self-pay

## 2018-04-01 NOTE — Telephone Encounter (Signed)
Mrs Soden called and stated after stopping meds she had no diarrhea this weekend, Requesting new med.

## 2018-04-01 NOTE — Telephone Encounter (Signed)
Patient lvmail stating she is feeling better since stopping meds. Msg sent to [facility] and Dr. Holley Raring

## 2018-04-02 ENCOUNTER — Emergency Department
Admission: EM | Admit: 2018-04-02 | Discharge: 2018-04-02 | Disposition: A | Discharge status: Home / Self Care | Payer: 59 | Attending: Emergency Medicine | Admitting: Emergency Medicine

## 2018-04-02 ENCOUNTER — Encounter: Payer: Self-pay | Admitting: Emergency Medicine

## 2018-04-02 DIAGNOSIS — I10 Essential (primary) hypertension: Secondary | ICD-10-CM | POA: Diagnosis not present

## 2018-04-02 DIAGNOSIS — Z79899 Other long term (current) drug therapy: Secondary | ICD-10-CM | POA: Diagnosis not present

## 2018-04-02 DIAGNOSIS — Z91010 Allergy to peanuts: Secondary | ICD-10-CM | POA: Diagnosis not present

## 2018-04-02 DIAGNOSIS — S161XXD Strain of muscle, fascia and tendon at neck level, subsequent encounter: Secondary | ICD-10-CM | POA: Diagnosis not present

## 2018-04-02 DIAGNOSIS — S199XXD Unspecified injury of neck, subsequent encounter: Secondary | ICD-10-CM | POA: Diagnosis present

## 2018-04-02 DIAGNOSIS — M7918 Myalgia, other site: Secondary | ICD-10-CM | POA: Diagnosis not present

## 2018-04-02 DIAGNOSIS — E039 Hypothyroidism, unspecified: Secondary | ICD-10-CM | POA: Diagnosis not present

## 2018-04-02 DIAGNOSIS — J45909 Unspecified asthma, uncomplicated: Secondary | ICD-10-CM | POA: Insufficient documentation

## 2018-04-02 MED ORDER — KETOROLAC TROMETHAMINE 30 MG/ML IJ SOLN
30.0000 mg | Freq: Once | INTRAMUSCULAR | Status: AC
  Administered 2018-04-02: 30 mg via INTRAMUSCULAR
  Filled 2018-04-02: qty 1

## 2018-04-02 MED ORDER — DICLOFENAC SODIUM 50 MG PO TBEC: 50.0000 mg | DELAYED_RELEASE_TABLET | Freq: Two times a day (BID) | ORAL | 0 refills | Status: DC

## 2018-04-02 MED ORDER — DICLOFENAC SODIUM 50 MG PO TBEC: 50.0000 mg | DELAYED_RELEASE_TABLET | Freq: Two times a day (BID) | ORAL | 0 refills | Status: AC
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
## 2018-04-02 NOTE — Discharge Instructions (Signed)
Take your home meds along with the newly prescribed Diclofenac, for pain & inflammation. Follow-up with your provider for ongoing symptoms.

## 2018-04-08 ENCOUNTER — Encounter (HOSPITAL_COMMUNITY): Payer: Self-pay | Admitting: Physical Therapy

## 2018-04-08 NOTE — Therapy (Signed)
[place], [place], [address] Phone: [phone]   Fax:  [phone]  Patient Details  Name: Kimberly [name] MRN: [id number] Date of Birth: Oct 06, 1960 Referring Provider:  No ref. provider found  Encounter Date: 04/08/2018  PHYSICAL THERAPY DISCHARGE SUMMARY  Visits from Start of Care: 7  Current functional level related to goals / functional outcomes: DC from [facility] due to transfer to [facility]    Remaining deficits: Unable to assess    Education / Equipment: None  Plan: Patient agrees to discharge.  Patient goals were not met. Patient is being discharged due to the patient's request.  ?????       Deniece Ree PT, DPT, CBIS  Supplemental Physical Therapist [facility]    Pager [phone] Acute Rehab Office [place], [place], [address] Phone: [phone]   Fax:  [phone]

## 2018-04-17 ENCOUNTER — Other Ambulatory Visit: Payer: Self-pay

## 2018-04-17 ENCOUNTER — Ambulatory Visit
Payer: 59 | Attending: Student in an Organized Health Care Education/Training Program | Admitting: Student in an Organized Health Care Education/Training Program

## 2018-04-17 ENCOUNTER — Encounter: Payer: Self-pay | Admitting: Student in an Organized Health Care Education/Training Program

## 2018-04-17 VITALS — BP 132/93 | HR 95 | Temp 98.0°F | Resp 18 | Ht 61.0 in | Wt 149.0 lb

## 2018-04-17 DIAGNOSIS — G894 Chronic pain syndrome: Secondary | ICD-10-CM

## 2018-04-17 DIAGNOSIS — Z79899 Other long term (current) drug therapy: Secondary | ICD-10-CM

## 2018-04-17 DIAGNOSIS — M542 Cervicalgia: Secondary | ICD-10-CM

## 2018-04-17 DIAGNOSIS — R51 Headache: Secondary | ICD-10-CM

## 2018-04-17 DIAGNOSIS — Z9889 Other specified postprocedural states: Secondary | ICD-10-CM | POA: Diagnosis not present

## 2018-04-17 DIAGNOSIS — Q761 Klippel-Feil syndrome: Secondary | ICD-10-CM

## 2018-04-17 DIAGNOSIS — R519 Headache, unspecified: Secondary | ICD-10-CM

## 2018-04-17 DIAGNOSIS — M5481 Occipital neuralgia: Secondary | ICD-10-CM

## 2018-04-17 MED ORDER — OXYCODONE-ACETAMINOPHEN 10-325 MG PO TABS: 1.0000 | ORAL_TABLET | Freq: Two times a day (BID) | ORAL | 0 refills | Status: DC | PRN

## 2018-04-17 NOTE — Progress Notes (Signed)
Safety precautions to be maintained throughout the outpatient stay will include: orient to surroundings, keep bed in low position, maintain call bell within reach at all times, provide assistance with transfer out of bed and ambulation.  

## 2018-04-17 NOTE — Progress Notes (Signed)
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 foraminal narrowing noted on the left at C3-4 and C4-5. No evidence for prevertebral soft tissue swelling.   IMPRESSION:  Degenerative disk changes in the lower lumbar spine. No evidence for acute fracture or subluxation.   Provider: Lyda Jester   Cervical DG Myelogram views: No results found for this or any previous visit. Cervical DG Myelogram views:  Results for orders placed during the hospital encounter of 05/11/16  DG MYELOGRAPHY LUMBAR INJ CERVICAL   Narrative CLINICAL DATA:  Cervical radiculopathy. Bilateral neck and arm pain, left greater than right. Pain is worse in the neck in the arms. Paresthesias in both hands.  FLUOROSCOPY TIME:  Radiation Exposure Index (as provided by  the fluoroscopic device): 108.6 uGy*m2  Fluoroscopy Time:  41 seconds  Number of Acquired Images:  14  PROCEDURE: LUMBAR PUNCTURE FOR CERVICAL MYELOGRAM  After thorough discussion of risks and benefits of the procedure including bleeding, infection, injury to nerves, blood vessels, adjacent structures as well as headache and CSF leak, written and oral informed consent was obtained. Consent was obtained by Dr. San Morelle. We discussed the high likelihood of obtaining a diagnostic study.  Patient was positioned prone on the fluoroscopy table. Local anesthesia was provided with 1% lidocaine without epinephrine after prepped and draped in the usual sterile fashion. Puncture was performed at L2-3 using a 3 1/2 inch 22-gauge spinal needle via a left paramedian approach. Using a single pass through the dura, the needle was placed within the thecal sac, with return of clear CSF. 10 mL of Isovue-M 200 was injected into the thecal sac, with normal opacification of the nerve roots and cauda equina consistent with free flow within the subarachnoid space. The patient was then moved to the trendelenburg position and contrast flowed into the Cervical spine region.  I personally performed the lumbar puncture and administered the intrathecal contrast. I also personally supervised acquisition of the myelogram images.  TECHNIQUE: Contiguous axial images were obtained through the Cervical spine after the intrathecal infusion of infusion. Coronal and sagittal reconstructions were obtained of the axial image sets.  FINDINGS: CERVICAL MYELOGRAM FINDINGS:  Anterior cervical fusion hardware is present at C2-3 scratched anterior cervical fusion hardware is present at C3-4, C6-7, and C7-T1. There is significant blunting of the nerve roots bilaterally at C4-5, left greater than right a prominent broad-based disc osteophyte complex. A more moderate broad-based disc osteophyte complex is  present at C5-6. There is progression of both levels. The central canal is decompressed at C3-4, C6-7, and C7-T1.  AP alignment is anatomic. There is some straightening of the normal cervical lordosis. Exaggerated motion is present at C4-5 with slight anterolisthesis in flexion and retrolisthesis in extension. There is no abnormal movement across the fused segments. There is congenital fusion at C2-3.  CT CERVICAL MYELOGRAM FINDINGS:  Cervical spine is imaged from the skullbase through the midbody of T3. Congenital fusion is noted at C2-3 anteriorly and posteriorly. Anterior spinal fusion hardware epididymis C3-4 without evidence of loosening. There is no bridging bone across the disc space. Solid anterior fusion is present at C6-7 and C7. The soft tissues of the neck are within normal limits. The lung apices are clear.  C3-4: Bilateral uncovertebral spurring has progressed. Mild foraminal narrowing is present bilaterally. The central canal is patent.  C4-5: A broad-based disc osteophyte  complex partially effaces the ventral CSF. Progressive bilateral uncovertebral spurring is present. Moderate left and mild right foraminal narrowing has progressed slightly.  C5-6: A broad-based disc osteophyte complex is present. Uncovertebral spurring is worse on the right. Mild right foraminal narrowing is worse.  C6-7: Solid anterior fusion is present. The central canal is decompressed. The left foramen is patent. Uncovertebral spurring contributes to mild residual right foraminal narrowing.  C7-T1: Solid anterior fusion is present. Uncovertebral spurring contributes to moderate right and mild left residual foraminal stenosis.  T1-2: There is some progression of uncovertebral spurring bilaterally with moderate bilateral foraminal stenosis, right greater than left.  IMPRESSION: 1. 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 2. Anterior fusion hardware at C3-4 without definitive bridging  bone across the disc space. 3. Solid anterior fusion at C6-7 and C7-T1. 4. Progressive uncovertebral spurring at C3-4 mild foraminal narrowing bilaterally. 5. Progressive bilateral uncovertebral spurring with moderate left and mild right foraminal stenosis at C4-5. 6. Broad-based disc osteophyte complex with mild uncovertebral spurring at C5-6, right greater than left. 7. Slight progression of mild right foraminal narrowing at C5-6. 8. Uncovertebral spurring with mild residual right foraminal narrowing at C6-7, moderate right, and mild left residual foraminal stenosis at C7-T1. 9. Progression of uncovertebral spurring and moderate bilateral foraminal stenosis at T1-2, right greater than left.   Electronically Signed   By: San Morelle M.D.   On: 05/11/2016 16:15    Cervical Discogram views: No results found for this or any previous visit.  Shoulder Imaging: Shoulder-R MR w contrast: No results found for this or any previous visit. Shoulder-L MR w contrast:  Results for orders placed during the hospital encounter of 12/14/14  MR Shoulder Left W Contrast   Narrative CLINICAL DATA:  Left shoulder pain, weakness and limited range of motion since a fall November, 2015. History of prior rotator cuff repair.  EXAM: MR ARTHROGRAM OF THE LEFT SHOULDER  TECHNIQUE: Multiplanar, multisequence MR imaging of the left shoulder was performed following the administration of intra-articular contrast.  CONTRAST:  See Injection Documentation.  COMPARISON:  Plain films left shoulder 09/12/2014. Images from contrast injection also reviewed.  FINDINGS: Rotator cuff: Soft tissue anchor in the greater tuberosity from rotator cuff repair is identified. There is rotator cuff tendinopathy. Mildly increased T2 signal is seen within the supraspinatus consistent with tendinopathy. There appears to be a small undersurface tear of the tendon with contrast seen extending into the tendon  fibers. No retraction is identified.  Muscles: Normal in appearance without atrophy or focal lesion.  Biceps long head: Intact.  Acromioclavicular Joint: Moderate degenerative change is seen.  Glenohumeral Joint: Unremarkable.  Labrum: Intact.  Bones: The patient appears to be status post acromioplasty. There is no fracture or worrisome marrow lesion. Contrast is present in the subacromial/subdeltoid bursa and is likely related to the injection as no full-thickness rotator cuff tear is identified.  IMPRESSION: Status post rotator cuff repair. There is a very small and shallow undersurface tear of the supraspinatus without retraction or atrophy.  Status post acromioplasty without complicating feature.  Acromioclavicular osteoarthritis.  Intact labrum.   Electronically Signed   By: Inge Rise M.D.   On: 12/14/2014 14:36     Shoulder-R DG:  Results for orders placed during the hospital encounter of 10/11/14  DG Shoulder Right   Narrative CLINICAL DATA:  Pain following fall 3 days prior  EXAM: RIGHT SHOULDER - 2+ VIEW  COMPARISON:  None.  FINDINGS: Frontal, Y scapular, and axillary images were obtained.  There is mild generalized osteoarthritic change. Note that there is bony overgrowth along the inferior acromion and distal clavicle. No fracture or dislocation. No erosive change or intra-articular calcification. Postoperative change is noted in the lower cervical region.  IMPRESSION: Osteoarthritic change. There is bony overgrowth along the inferior distal clavicle and acromion. Bony overgrowth of this nature may lead to so-called impingement syndrome. MR is the imaging study of choice if impingement syndrome is suspected clinically. No fracture or dislocation.   Electronically Signed   By: Lowella Grip III M.D.   On: 10/11/2014 15:23    Shoulder-L DG:  Results for orders placed during the hospital encounter of 02/04/17  DG Shoulder Left    Narrative CLINICAL DATA:  Trauma.  Left shoulder pain.  EXAM: LEFT SHOULDER - 2+ VIEW  COMPARISON:  None.  FINDINGS: Postoperative changes involving the shoulder noted. No significant degenerative changes. No acute fracture. The visualized left lung is clear and the visualized left ribs are intact. Minimal streaky left basilar atelectasis noted.  IMPRESSION: No acute bony findings.   Electronically Signed   By: Marijo Sanes M.D.   On: 02/04/2017 16:48     Lumbar CT w contrast:  Results for orders placed during the hospital encounter of 02/17/14  CT Lumbar Spine W Contrast   Narrative CLINICAL DATA:  Lumbar and cervical radiculopathy. Neck pain radiating into both arms with bilateral hand weakness. Low back pain radiating into both lower extremities to the feet.  FLUOROSCOPY TIME:  0 min 51 seconds  PROCEDURE: LUMBAR PUNCTURE FOR CERVICAL AND LUMBAR MYELOGRAM  CERVICAL AND LUMBAR MYELOGRAM  CT CERVICAL MYELOGRAM  CT LUMBAR MYELOGRAM  After thorough discussion of risks and benefits of the procedure including bleeding, infection, injury to nerves, blood vessels, adjacent structures as well as headache and CSF leak, written and oral informed consent was obtained. Consent was obtained by Dr. Logan Bores.  Patient was positioned prone on the fluoroscopy table. Local anesthesia was provided with 1% lidocaine without epinephrine after prepped and draped in the usual sterile fashion. Puncture was performed at L4-5 using a 3 1/2 inch 22-gauge spinal needle via a right paramedian approach. Using a single pass through the dura, the needle was placed within the thecal sac, with return of clear CSF. 10 mL of Omnipaque-300 was injected into the thecal sac, with normal opacification of the nerve roots and cauda equina consistent with free flow within the subarachnoid space. The patient was then moved to the Trendelenburg position and contrast flowed into the Cervical spine  region.  I personally performed the lumbar puncture and administered the intrathecal contrast. I also personally supervised acquisition of the myelogram images.  TECHNIQUE: Contiguous axial images were obtained through the Cervical and Lumbar spine after the intrathecal infusion of contrast. Coronal and sagittal reconstructions were obtained of the axial image sets.  COMPARISON:  Cervical spine MRI 12/25/2013  FINDINGS: CERVICAL AND LUMBAR MYELOGRAM FINDINGS:  Congenital fusion is again noted at C2-3 involving the vertebral bodies and posterior elements. There is straightening of the normal cervical lordosis. There there is at most trace retrolisthesis of C4 on C5 and C5 on C6 which reduces with flexion and does not significantly change with extension. Moderate multilevel disc space narrowing is seen in the lower cervical spine with associated endplate osteophyte formation. Small to moderate-sized ventral epidural defects are present at each level throughout the cervical spine.  Lumbar vertebral alignment is normal in neutral, flexion, and extension positioning. Lumbar vertebral body heights are preserved.  Lumbar intervertebral disc space heights are relatively well preserved. Small ventral epidural defects are noted from L2-3 to L5-S1.  CT CERVICAL MYELOGRAM FINDINGS:  There is straightening of the normal cervical lordosis without significant listhesis. Congenital C2-3 fusion is noted. Moderate disc space narrowing is present at C3-4, C6-7, and C7-T1. Endplate osteophyte formation is present throughout the cervical spine. Cervical spinal cord is normal in caliber. Paraspinal soft tissues are unremarkable.  C2-3: Congenital vertebral body and posterior element fusion without stenosis.  C3-4: Broad-based posterior disc osteophyte complex results in moderate spinal stenosis and mild-to-moderate bilateral neural foraminal stenosis. There is mild flattening of the ventral  spinal cord.  C4-5: Broad-based posterior disc osteophyte complex results in mild spinal stenosis and mild right and mild-to-moderate left neural foraminal stenosis.  C5-6: Broad-based posterior disc osteophyte complex results in mild spinal stenosis. No neural foraminal stenosis.  C6-7: Broad-based posterior disc osteophyte complex results in mild-to-moderate spinal stenosis. Prominent bilateral uncovertebral arthrosis results in moderate right and severe left neural foraminal stenosis.  C7-T1: Shallow posterior disc osteophyte complex without spinal stenosis. Uncovertebral arthrosis results in moderate bilateral neural foraminal stenosis.  CT LUMBAR MYELOGRAM FINDINGS:  The lowest well-formed intervertebral disc space is labeled L5-S1. There is minimal left convex curvature of the lumbar spine. There is no listhesis. Vertebral body heights and intervertebral disc space heights are preserved. Conus medullaris terminates at L1-2. Paraspinal soft tissues are unremarkable.  L1-2:  Minimal disc bulge without stenosis.  L2-3:  Mild disc bulge without stenosis.  L3-4: Mild disc bulge asymmetric to the right and moderate facet and ligamentum flavum hypertrophy result in mild spinal stenosis and mild right neural foraminal stenosis.  L4-5: Mild disc bulge asymmetric to the right and mild to moderate facet and ligamentum flavum hypertrophy result in mild bilateral lateral recess narrowing and mild right neural foraminal narrowing.  L5-S1: Broad-based right central/subarticular disc protrusion narrows the right lateral recess and contacts both S1 nerve roots, mildly displacing the S1 nerve root on the right. There is moderate right and mild left facet arthrosis which, in combination with the disc protrusion, results in mild right greater than left neural foraminal narrowing. No spinal stenosis.  IMPRESSION: 1. Moderate multilevel cervical spondylosis. Spinal stenosis  is greatest at C3-4, where it is moderate. Neural foraminal stenosis is greatest at C6-7, where it is moderate on the right and severe on the left. 2. Mild disc bulging and moderate facet arthrosis in the mid and lower lumbar spine, with a disc protrusion at L5-S1 potentially affecting both S1 nerve roots, right greater than left.   Electronically Signed   By: Logan Bores   On: 02/17/2014 17:30     Lumbar DG Myelogram:  Results for orders placed during the hospital encounter of 05/11/16  DG MYELOGRAPHY LUMBAR INJ CERVICAL   Narrative CLINICAL DATA:  Cervical radiculopathy. Bilateral neck and arm pain, left greater than right. Pain is worse in the neck in the arms. Paresthesias in both hands.  FLUOROSCOPY TIME:  Radiation Exposure Index (as provided by the fluoroscopic device): 108.6 uGy*m2  Fluoroscopy Time:  41 seconds  Number of Acquired Images:  14  PROCEDURE: LUMBAR PUNCTURE FOR CERVICAL MYELOGRAM  After thorough discussion of risks and benefits of the procedure including bleeding, infection, injury to nerves, blood vessels, adjacent structures as well as headache and CSF leak, written and oral informed consent was obtained. Consent was obtained by Dr. San Morelle. We discussed the high likelihood of obtaining a diagnostic study.  Patient was  positioned prone on the fluoroscopy table. Local anesthesia was provided with 1% lidocaine without epinephrine after prepped and draped in the usual sterile fashion. Puncture was performed at L2-3 using a 3 1/2 inch 22-gauge spinal needle via a left paramedian approach. Using a single pass through the dura, the needle was placed within the thecal sac, with return of clear CSF. 10 mL of Isovue-M 200 was injected into the thecal sac, with normal opacification of the nerve roots and cauda equina consistent with free flow within the subarachnoid space. The patient was then moved to the trendelenburg position and contrast  flowed into the Cervical spine region.  I personally performed the lumbar puncture and administered the intrathecal contrast. I also personally supervised acquisition of the myelogram images.  TECHNIQUE: Contiguous axial images were obtained through the Cervical spine after the intrathecal infusion of infusion. Coronal and sagittal reconstructions were obtained of the axial image sets.  FINDINGS: CERVICAL MYELOGRAM FINDINGS:  Anterior cervical fusion hardware is present at C2-3 scratched anterior cervical fusion hardware is present at C3-4, C6-7, and C7-T1. There is significant blunting of the nerve roots bilaterally at C4-5, left greater than right a prominent broad-based disc osteophyte complex. A more moderate broad-based disc osteophyte complex is present at C5-6. There is progression of both levels. The central canal is decompressed at C3-4, C6-7, and C7-T1.  AP alignment is anatomic. There is some straightening of the normal cervical lordosis. Exaggerated motion is present at C4-5 with slight anterolisthesis in flexion and retrolisthesis in extension. There is no abnormal movement across the fused segments. There is congenital fusion at C2-3.  CT CERVICAL MYELOGRAM FINDINGS:  Cervical spine is imaged from the skullbase through the midbody of T3. Congenital fusion is noted at C2-3 anteriorly and posteriorly. Anterior spinal fusion hardware epididymis C3-4 without evidence of loosening. There is no bridging bone across the disc space. Solid anterior fusion is present at C6-7 and C7. The soft tissues of the neck are within normal limits. The lung apices are clear.  C3-4: Bilateral uncovertebral spurring has progressed. Mild foraminal narrowing is present bilaterally. The central canal is patent.  C4-5: A broad-based disc osteophyte complex partially effaces the ventral CSF. Progressive bilateral uncovertebral spurring is present. Moderate left and mild right foraminal  narrowing has progressed slightly.  C5-6: A broad-based disc osteophyte complex is present. Uncovertebral spurring is worse on the right. Mild right foraminal narrowing is worse.  C6-7: Solid anterior fusion is present. The central canal is decompressed. The left foramen is patent. Uncovertebral spurring contributes to mild residual right foraminal narrowing.  C7-T1: Solid anterior fusion is present. Uncovertebral spurring contributes to moderate right and mild left residual foraminal stenosis.  T1-2: There is some progression of uncovertebral spurring bilaterally with moderate bilateral foraminal stenosis, right greater than left.  IMPRESSION: 1. 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 2. Anterior fusion hardware at C3-4 without definitive bridging bone across the disc space. 3. Solid anterior fusion at C6-7 and C7-T1. 4. Progressive uncovertebral spurring at C3-4 mild foraminal narrowing bilaterally. 5. Progressive bilateral uncovertebral spurring with moderate left and mild right foraminal stenosis at C4-5. 6. Broad-based disc osteophyte complex with mild uncovertebral spurring at C5-6, right greater than left. 7. Slight progression of mild right foraminal narrowing at C5-6. 8. Uncovertebral spurring with mild residual right foraminal narrowing at C6-7, moderate right, and mild left residual foraminal stenosis at C7-T1. 9. Progression of uncovertebral spurring and moderate bilateral foraminal stenosis at T1-2, right greater than left.   Electronically Signed  By: San Morelle M.D.   On: 05/11/2016 16:15    Lumbar DG Myelogram:  Results for orders placed during the hospital encounter of 02/17/14  DG MYELOGRAPHY LUMBAR INJ MULTI REGION   Narrative CLINICAL DATA:  Lumbar and cervical radiculopathy. Neck pain radiating into both arms with bilateral hand weakness. Low back pain radiating into both lower extremities to the feet.  FLUOROSCOPY TIME:  0 min 51  seconds  PROCEDURE: LUMBAR PUNCTURE FOR CERVICAL AND LUMBAR MYELOGRAM  CERVICAL AND LUMBAR MYELOGRAM  CT CERVICAL MYELOGRAM  CT LUMBAR MYELOGRAM  After thorough discussion of risks and benefits of the procedure including bleeding, infection, injury to nerves, blood vessels, adjacent structures as well as headache and CSF leak, written and oral informed consent was obtained. Consent was obtained by Dr. Logan Bores.  Patient was positioned prone on the fluoroscopy table. Local anesthesia was provided with 1% lidocaine without epinephrine after prepped and draped in the usual sterile fashion. Puncture was performed at L4-5 using a 3 1/2 inch 22-gauge spinal needle via a right paramedian approach. Using a single pass through the dura, the needle was placed within the thecal sac, with return of clear CSF. 10 mL of Omnipaque-300 was injected into the thecal sac, with normal opacification of the nerve roots and cauda equina consistent with free flow within the subarachnoid space. The patient was then moved to the Trendelenburg position and contrast flowed into the Cervical spine region.  I personally performed the lumbar puncture and administered the intrathecal contrast. I also personally supervised acquisition of the myelogram images.  TECHNIQUE: Contiguous axial images were obtained through the Cervical and Lumbar spine after the intrathecal infusion of contrast. Coronal and sagittal reconstructions were obtained of the axial image sets.  COMPARISON:  Cervical spine MRI 12/25/2013  FINDINGS: CERVICAL AND LUMBAR MYELOGRAM FINDINGS:  Congenital fusion is again noted at C2-3 involving the vertebral bodies and posterior elements. There is straightening of the normal cervical lordosis. There there is at most trace retrolisthesis of C4 on C5 and C5 on C6 which reduces with flexion and does not significantly change with extension. Moderate multilevel disc space narrowing is seen  in the lower cervical spine with associated endplate osteophyte formation. Small to moderate-sized ventral epidural defects are present at each level throughout the cervical spine.  Lumbar vertebral alignment is normal in neutral, flexion, and extension positioning. Lumbar vertebral body heights are preserved. Lumbar intervertebral disc space heights are relatively well preserved. Small ventral epidural defects are noted from L2-3 to L5-S1.  CT CERVICAL MYELOGRAM FINDINGS:  There is straightening of the normal cervical lordosis without significant listhesis. Congenital C2-3 fusion is noted. Moderate disc space narrowing is present at C3-4, C6-7, and C7-T1. Endplate osteophyte formation is present throughout the cervical spine. Cervical spinal cord is normal in caliber. Paraspinal soft tissues are unremarkable.  C2-3: Congenital vertebral body and posterior element fusion without stenosis.  C3-4: Broad-based posterior disc osteophyte complex results in moderate spinal stenosis and mild-to-moderate bilateral neural foraminal stenosis. There is mild flattening of the ventral spinal cord.  C4-5: Broad-based posterior disc osteophyte complex results in mild spinal stenosis and mild right and mild-to-moderate left neural foraminal stenosis.  C5-6: Broad-based posterior disc osteophyte complex results in mild spinal stenosis. No neural foraminal stenosis.  C6-7: Broad-based posterior disc osteophyte complex results in mild-to-moderate spinal stenosis. Prominent bilateral uncovertebral arthrosis results in moderate right and severe left neural foraminal stenosis.  C7-T1: Shallow posterior disc osteophyte complex without spinal stenosis. Uncovertebral arthrosis results  in moderate bilateral neural foraminal stenosis.  CT LUMBAR MYELOGRAM FINDINGS:  The lowest well-formed intervertebral disc space is labeled L5-S1. There is minimal left convex curvature of the lumbar spine. There  is no listhesis. Vertebral body heights and intervertebral disc space heights are preserved. Conus medullaris terminates at L1-2. Paraspinal soft tissues are unremarkable.  L1-2:  Minimal disc bulge without stenosis.  L2-3:  Mild disc bulge without stenosis.  L3-4: Mild disc bulge asymmetric to the right and moderate facet and ligamentum flavum hypertrophy result in mild spinal stenosis and mild right neural foraminal stenosis.  L4-5: Mild disc bulge asymmetric to the right and mild to moderate facet and ligamentum flavum hypertrophy result in mild bilateral lateral recess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

## 2018-04-17 NOTE — Patient Instructions (Addendum)
1. Sign opioid contract. 2.  Follow-up in 2 months. 3. Obtain TENs unit from [facility] Oxycodone e-scribed to [employer].

## 2018-05-29 ENCOUNTER — Ambulatory Visit: Payer: 59 | Attending: Nurse Practitioner | Admitting: Nurse Practitioner

## 2018-05-29 ENCOUNTER — Encounter: Payer: Self-pay | Admitting: Nurse Practitioner

## 2018-05-29 VITALS — BP 121/93 | HR 98 | Temp 97.6°F | Resp 16 | Ht 61.0 in | Wt 148.0 lb

## 2018-05-29 DIAGNOSIS — M47812 Spondylosis without myelopathy or radiculopathy, cervical region: Secondary | ICD-10-CM

## 2018-05-29 DIAGNOSIS — M542 Cervicalgia: Secondary | ICD-10-CM

## 2018-05-29 DIAGNOSIS — R102 Pelvic and perineal pain: Secondary | ICD-10-CM | POA: Insufficient documentation

## 2018-05-29 DIAGNOSIS — G894 Chronic pain syndrome: Secondary | ICD-10-CM

## 2018-05-29 DIAGNOSIS — Q761 Klippel-Feil syndrome: Secondary | ICD-10-CM | POA: Diagnosis not present

## 2018-05-29 MED ORDER — OXYCODONE-ACETAMINOPHEN 10-325 MG PO TABS: 1.0000 | ORAL_TABLET | Freq: Two times a day (BID) | ORAL | 0 refills | Status: DC | PRN

## 2018-05-29 MED ORDER — OXYCODONE-ACETAMINOPHEN 10-325 MG PO TABS
1.0000 | ORAL_TABLET | Freq: Two times a day (BID) | ORAL | 0 refills | Status: DC | PRN
Start: 2018-07-14 — End: 2018-09-08

## 2018-05-29 MED ORDER — DULOXETINE HCL 30 MG PO CPEP: 30.0000 mg | ORAL_CAPSULE | Freq: Every day | ORAL | 1 refills | Status: DC

## 2018-05-29 NOTE — Patient Instructions (Signed)
____________________________________________________________________________________________  Medication Rules  Applies to: All patients receiving prescriptions (written or electronic).  Pharmacy of record: Pharmacy where electronic prescriptions will be sent. If written prescriptions are taken to a different pharmacy, please inform the nursing staff. The pharmacy listed in the electronic medical record should be the one where you would like electronic prescriptions to be sent.  Prescription refills: Only during scheduled appointments. Applies to both, written and electronic prescriptions.  NOTE: The following applies primarily to controlled substances (Opioid* Pain Medications).   Patient's responsibilities: 1. Pain Pills: Bring all pain pills to every appointment (except for procedure appointments). 2. Pill Bottles: Bring pills in original pharmacy bottle. Always bring newest bottle. Bring bottle, even if empty. 3. Medication refills: You are responsible for knowing and keeping track of what medications you need refilled. The day before your appointment, write a list of all prescriptions that need to be refilled. Bring that list to your appointment and give it to the admitting nurse. Prescriptions will be written only during appointments. If you forget a medication, it will not be "Called in", "Faxed", or "electronically sent". You will need to get another appointment to get these prescribed. 4. Prescription Accuracy: You are responsible for carefully inspecting your prescriptions before leaving our office. Have the discharge nurse carefully go over each prescription with you, before taking them home. Make sure that your name is accurately spelled, that your address is correct. Check the name and dose of your medication to make sure it is accurate. Check the number of pills, and the written instructions to make sure they are clear and accurate. Make sure that you are given enough medication to last  until your next medication refill appointment. 5. Taking Medication: Take medication as prescribed. Never take more pills than instructed. Never take medication more frequently than prescribed. Taking less pills or less frequently is permitted and encouraged, when it comes to controlled substances (written prescriptions).  6. Inform other Doctors: Always inform, all of your healthcare providers, of all the medications you take. 7. Pain Medication from other Providers: You are not allowed to accept any additional pain medication from any other Doctor or Healthcare provider. There are two exceptions to this rule. (see below) In the event that you require additional pain medication, you are responsible for notifying us, as stated below. 8. Medication Agreement: You are responsible for carefully reading and following our Medication Agreement. This must be signed before receiving any prescriptions from our practice. Safely store a copy of your signed Agreement. Violations to the Agreement will result in no further prescriptions. (Additional copies of our Medication Agreement are available upon request.) 9. Laws, Rules, & Regulations: All patients are expected to follow all Federal and State Laws, Statutes, Rules, & Regulations. Ignorance of the Laws does not constitute a valid excuse. The use of any illegal substances is prohibited. 10. Adopted CDC guidelines & recommendations: Target dosing levels will be at or below 60 MME/day. Use of benzodiazepines** is not recommended.  Exceptions: There are only two exceptions to the rule of not receiving pain medications from other Healthcare Providers. 1. Exception #1 (Emergencies): In the event of an emergency (i.e.: accident requiring emergency care), you are allowed to receive additional pain medication. However, you are responsible for: As soon as you are able, call our office [phone], at any time of the day or night, and leave a message stating your name, the  date and nature of the emergency, and the name and dose of the medication   prescribed. In the event that your call is answered by a member of our staff, make sure to document and save the date, time, and the name of the person that took your information.  2. Exception #2 (Planned Surgery): In the event that you are scheduled by another doctor or dentist to have any type of surgery or procedure, you are allowed (for a period no longer than 30 days), to receive additional pain medication, for the acute post-op pain. However, in this case, you are responsible for picking up a copy of our "Post-op Pain Management for Surgeons" handout, and giving it to your surgeon or dentist. This document is available at our office, and does not require an appointment to obtain it. Simply go to our office during business hours (Monday-Thursday from 8:00 AM to 4:00 PM) (Friday 8:00 AM to 12:00 Noon) or if you have a scheduled appointment with us, prior to your surgery, and ask for it by name. In addition, you will need to provide us with your name, name of your surgeon, type of surgery, and date of procedure or surgery.  *Opioid medications include: morphine, codeine, oxycodone, oxymorphone, hydrocodone, hydromorphone, meperidine, tramadol, tapentadol, buprenorphine, fentanyl, methadone. **Benzodiazepine medications include: diazepam (Valium), alprazolam (Xanax), clonazepam (Klonopine), lorazepam (Ativan), clorazepate (Tranxene), chlordiazepoxide (Librium), estazolam (Prosom), oxazepam (Serax), temazepam (Restoril), triazolam (Halcion) (Last updated: 09/26/2017) ____________________________________________________________________________________________    

## 2018-05-29 NOTE — Progress Notes (Signed)
Nursing Pain Medication Assessment:  Safety precautions to be maintained throughout the outpatient stay will include: orient to surroundings, keep bed in low position, maintain call bell within reach at all times, provide assistance with transfer out of bed and ambulation.  Medication Inspection Compliance: Pill count conducted under aseptic conditions, in front of the patient. Neither the pills nor the bottle was removed from the patient's sight at any time. Once count was completed pills were immediately returned to the patient in their original bottle.  Medication: Oxycodone/APAP Pill/Patch Count: 49 of 60 pills remain Pill/Patch Appearance: Markings consistent with prescribed medication Bottle Appearance: Standard pharmacy container. Clearly labeled. Filled Date: 09 / 19 / 2019 Last Medication intake:  Today

## 2018-05-29 NOTE — Progress Notes (Signed)
Patient's Name: Kimberly [name]  MRN: [id number]  Referring Provider: Glendon Axe, MD  DOB: [birth year]  PCP: Glendon Axe, MD  DOS: 05/29/2018  Note by: Vevelyn Francois NP  Service setting: Ambulatory outpatient  Specialty: Interventional Pain Management  Location: [facility] (AMB) Pain Management Facility    Patient type: Established    Primary Reason(s) for Visit: Encounter for prescription drug management. (Level of risk: moderate)  CC: Headache and Neck Pain  HPI  Kimberly [name] is a 57 y.o. year old, female patient, who comes today for a medication management evaluation. She has SHOULDER PAIN; IMPINGEMENT SYNDROME; RUPTURE ROTATOR CUFF; HTN (hypertension); S/P carpal tunnel release; CTS (carpal tunnel syndrome); Cervical spondylosis without myelopathy; Muscle weakness (generalized); Tight fascia; Decreased range of motion of shoulder; Cervical stenosis of spinal canal; Asthma; Acute chest wall pain; GERD (gastroesophageal reflux disease); Spontaneous pneumothorax; Pseudoarthrosis of cervical spine ([place]); Bilateral hand pain; Degenerative disc disease, cervical; Dizziness; Dysphagia; History of recent fall; Hypothyroidism (acquired); Loss of memory; Medication overuse headache; Chronic tension-type headache, not intractable; Numbness and tingling; Reaction to severe stress; Vasovagal syncope; Vitamin D deficiency; Cervical fusion syndrome; Hx of cervical spine surgery; Chronic pain syndrome; Cervicalgia; Controlled substance agreement signed; Depression, major, recurrent, mild ([place]); Depression, major, single episode, complete remission ([place]); Pelvic pain in female; and Poorly-controlled hypertension on their problem list. Her primarily concern today is the Headache and Neck Pain  Pain Assessment: Location: Posterior Head(neck) Radiating: denies  Onset: More than a month ago Duration: Chronic pain Quality: Discomfort, Constant, Aching, Throbbing, Nagging, Pressure Severity: 7 /10 (subjective,  self-reported pain score)  Note: Reported level is compatible with observation. Clinically the patient looks like a 2/10 A 2/10 is viewed as "Mild to Moderate" and described as noticeable and distracting. Impossible to hide from other people. More frequent flare-ups. Still possible to adapt and function close to normal. It can be very annoying and may have occasional stronger flare-ups. With discipline, patients may get used to it and adapt.       When using our objective Pain Scale, levels between 6 and 10/10 are said to belong in an emergency room, as it progressively worsens from a 6/10, described as severely limiting, requiring emergency care not usually available at an outpatient pain management facility. At a 6/10 level, communication becomes difficult and requires great effort. Assistance to reach the emergency department may be required. Facial flushing and profuse sweating along with potentially dangerous increases in heart rate and blood pressure will be evident. Effect on ADL: PT is painful, terrible days, unable to do her walking.  pain is excruciating.  Timing: Constant Modifying factors: medications,  laying down  BP: (!) 121/93  HR: 98  Kimberly [name] was last scheduled for an appointment on Visit date not found for medication management. During today's appointment we reviewed Kimberly [name]'s chronic pain status, as well as her outpatient medication regimen.  She admits that she continues to have her chronic neck and back of the head pain.  She admits that she has accepted that this is something that she would have to live with.  She would just like relief so that she can do things that she would like to do.  She admits that her current medication is effective.  The patient  reports that she does not use drugs. Her body mass index is 27.96 kg/m.  Further details on both, my assessment(s), as well as the proposed treatment plan, please see below.  Controlled Substance Pharmacotherapy  Assessment REMS (Risk  Evaluation and Mitigation Strategy)  Analgesic: Percocet 10 mg BID prn MME/day: 30 mg/day. Janett Billow, RN  05/29/2018 11:24 AM  Sign at close encounter Nursing Pain Medication Assessment:  Safety precautions to be maintained throughout the outpatient stay will include: orient to surroundings, keep bed in low position, maintain call bell within reach at all times, provide assistance with transfer out of bed and ambulation.  Medication Inspection Compliance: Pill count conducted under aseptic conditions, in front of the patient. Neither the pills nor the bottle was removed from the patient's sight at any time. Once count was completed pills were immediately returned to the patient in their original bottle.  Medication: Oxycodone/APAP Pill/Patch Count: 49 of 60 pills remain Pill/Patch Appearance: Markings consistent with prescribed medication Bottle Appearance: Standard pharmacy container. Clearly labeled. Filled Date: 09 / 19 / 2019 Last Medication intake:  Today   Pharmacokinetics: Liberation and absorption (onset of action): WNL Distribution (time to peak effect): WNL Metabolism and excretion (duration of action): WNL         Pharmacodynamics: Desired effects: Analgesia: Kimberly [name] reports >50% benefit. Functional ability: Patient reports that medication allows her to accomplish basic ADLs Clinically meaningful improvement in function (CMIF): Sustained CMIF goals met Perceived effectiveness: Described as relatively effective, allowing for increase in activities of daily living (ADL) Undesirable effects: Side-effects or Adverse reactions: None reported Monitoring: [place] PMP: Online review of the past 69-monthperiod conducted. Compliant with practice rules and regulations Last UDS on record:  UDS interpretation: Compliant          Medication Assessment Form: Reviewed. Patient indicates being compliant with therapy Treatment compliance: Compliant Risk  Assessment Profile: Aberrant behavior: See prior evaluations. None observed or detected today Comorbid factors increasing risk of overdose: See prior notes. No additional risks detected today Risk of substance use disorder (SUD): Low  ORT Scoring interpretation table:  Score <3 = Low Risk for SUD  Score between 4-7 = Moderate Risk for SUD  Score >8 = High Risk for Opioid Abuse   Risk Mitigation Strategies:  Patient Counseling: Covered Patient-Prescriber Agreement (PPA): Present and active  Notification to other healthcare providers: Done  Pharmacologic Plan: No change in therapy, at this time.             Laboratory Chemistry  Inflammation Markers (CRP: Acute Phase) (ESR: Chronic Phase) No results found for: CRP, ESRSEDRATE, LATICACIDVEN                       Rheumatology Markers No results found for: RF, ANA, LABURIC, URICUR, LYMEIGGIGMAB, LYMEABIGMQN, HLAB27                      Renal Function Markers Lab Results  Component Value Date   BUN 11 07/15/2017   CREATININE 0.80 03/26/2018   GFRAA >60 07/15/2017   GFRNONAA >60 07/15/2017                             Hepatic Function Markers Lab Results  Component Value Date   AST 26 07/31/2015   ALT 17 07/31/2015   ALBUMIN 4.1 07/31/2015   ALKPHOS 93 07/31/2015   LIPASE 23 07/15/2017                        Electrolytes Lab Results  Component Value Date   NA 137 07/15/2017   K 4.1 07/15/2017   CL 101  07/15/2017   CALCIUM 9.4 07/15/2017                        Neuropathy Markers Lab Results  Component Value Date   HIV NONREACTIVE 12/18/2013                        CNS Tests No results found for: COLORCSF, APPEARCSF, RBCCOUNTCSF, WBCCSF, POLYSCSF, LYMPHSCSF, EOSCSF, PROTEINCSF, GLUCCSF, JCVIRUS, CSFOLI, IGGCSF                      Bone Pathology Markers No results found for: VD25OH, [id number], [id number], [id number], 25OHVITD1, 25OHVITD2, 25OHVITD3, TESTOFREE, TESTOSTERONE                       Coagulation  Parameters Lab Results  Component Value Date   PLT 247 07/15/2017   DDIMER 0.28 07/02/2013                        Cardiovascular Markers Lab Results  Component Value Date   TROPONINI <0.03 07/15/2017   HGB 13.8 07/15/2017   HCT 41.8 07/15/2017                         CA Markers No results found for: CEA, CA125, LABCA2                      Note: Lab results reviewed.  Recent Diagnostic Imaging Results  CT SOFT TISSUE NECK W CONTRAST CLINICAL DATA:  Initial evaluation for continued neck pain with difficulty swallowing since prior trauma in 2018. History of 3 prior neck surgeries.  EXAM: CT NECK WITH CONTRAST  TECHNIQUE: Multidetector CT imaging of the neck was performed using the standard protocol following the bolus administration of intravenous contrast.  CONTRAST:  17m ISOVUE-300 IOPAMIDOL (ISOVUE-300) INJECTION 61%  COMPARISON:  Prior radiograph from 04/10/2017 as well as prior CT from 02/04/2017.  FINDINGS: Pharynx and larynx: Oral cavity within normal limits without mass lesion or loculated fluid collection. No acute abnormality about the dentition. Palatine tonsils symmetric and within normal limits. Parapharyngeal fat maintained. Nasopharynx within normal limits. No retropharyngeal collection. Epiglottis within normal limits. Vallecula partially effaced by the lingual tonsils. Remainder of the hypopharynx and supraglottic larynx within normal limits. Piriform sinuses are clear. True cords apposed and not well assessed, but are grossly symmetric and within normal limits. Subglottic airway clear.  Salivary glands: Salivary glands including the parotid and submandibular glands within normal limits.  Thyroid: Thyroid within normal limits.  Lymph nodes: No pathologically enlarged lymph nodes identified within the neck.  Vascular: Normal intravascular enhancement seen within the neck. Mild atheromatous change about the aortic arch and right  carotid bifurcation without significant stenosis. Vertebral arteries not well assessed due to extensive streak artifact from cervical fusion hardware. Scattered subcutaneous venous collaterals noted within the anterior aspect of the upper chest.  Limited intracranial: Unremarkable  Visualized orbits: Visualized globes and orbital soft tissues within normal limits.  Mastoids and visualized paranasal sinuses: Visualized paranasal sinuses are clear. Visualize mastoids and middle ear cavities are well pneumatized and free of fluid.  Skeleton: Extensive prior fusion seen throughout the cervical spine, extending from C2 through T1 both anteriorly and posteriorly. Overall, appearance is grossly stable from previous CT. No obvious adverse features identified on this exam. No discrete lytic or blastic osseous lesions.  Upper  chest: Visualized upper chest within normal limits. Partially visualized lungs are clear.  Other: None.  IMPRESSION: 1. No acute abnormality identified within the neck. No findings to explain patient's symptoms identified. 2. Extensive cervical fusion extending from C2 through T1, grossly stable from previous without adverse features.  Electronically Signed   By: Jeannine Boga M.D.   On: 03/26/2018 18:40  Complexity Note: Imaging results reviewed. Results shared with Kimberly [name], using Layman's terms.                         Meds   Current Outpatient Medications:  .  acetaminophen (TYLENOL) 500 MG tablet, Take 500 mg by mouth 2 (two) times daily as needed for headache., Disp: , Rfl:  .  acyclovir (ZOVIRAX) 400 MG tablet, Take 400 mg by mouth 2 (two) times daily. , Disp: , Rfl:  .  albuterol (PROVENTIL HFA;VENTOLIN HFA) 108 (90 Base) MCG/ACT inhaler, Inhale 2 puffs into the lungs every 4 (four) hours as needed for wheezing or shortness of breath., Disp: 1 Inhaler, Rfl: 2 .  ALPRAZolam (XANAX) 0.5 MG tablet, Take 0.5 mg by mouth 3 (three) times daily as  needed for anxiety., Disp: , Rfl:  .  amLODipine (NORVASC) 5 MG tablet, Take 5 mg by mouth daily. , Disp: , Rfl:  .  Baclofen 5 MG TABS, Take 10 mg by mouth 3 (three) times daily as needed. (Patient taking differently: Take 10 mg by mouth 3 (three) times daily as needed (spasms). ), Disp: 20 tablet, Rfl: 0 .  Cholecalciferol (VITAMIN D-1000 MAX ST) 1000 units tablet, Take 1,000 Units by mouth daily., Disp: , Rfl:  .  clotrimazole (LOTRIMIN) 1 % cream, Apply 1 application topically daily as needed (itching). , Disp: , Rfl:  .  cyclobenzaprine (FLEXERIL) 10 MG tablet, Take 1 tablet by mouth as needed for muscle spasms. , Disp: , Rfl:  .  diazepam (VALIUM) 10 MG tablet, Take 10 mg by mouth daily as needed for anxiety., Disp: , Rfl:  .  escitalopram (LEXAPRO) 10 MG tablet, Take 10 mg by mouth daily as needed (anxiety). , Disp: , Rfl:  .  estradiol (ESTRACE) 1 MG tablet, Take 1 mg by mouth daily. , Disp: , Rfl:  .  gabapentin (NEURONTIN) 100 MG capsule, Take 200 mg by mouth every 8 (eight) hours., Disp: , Rfl:  .  levofloxacin (LEVAQUIN) 250 MG tablet, Take 250 mg by mouth daily., Disp: , Rfl:  .  liothyronine (CYTOMEL) 5 MCG tablet, Take 1 tablet by mouth daily., Disp: , Rfl:  .  nortriptyline (PAMELOR) 10 MG capsule, Take 1 capsule by mouth at bedtime as needed for sleep. , Disp: , Rfl:  .  nystatin (MYCOSTATIN) 100000 UNIT/ML suspension, Take 100,000 Units by mouth daily. Swish and spit, Disp: , Rfl: 0 .  oxybutynin (DITROPAN-XL) 5 MG 24 hr tablet, Take 5 mg by mouth 2 (two) times daily. , Disp: , Rfl:  .  [START ON 07/14/2018] oxyCODONE-acetaminophen (PERCOCET) 10-325 MG tablet, Take 1 tablet by mouth 2 (two) times daily as needed for pain., Disp: 60 tablet, Rfl: 0 .  pantoprazole (PROTONIX) 40 MG tablet, Take 40 mg by mouth daily., Disp: , Rfl:  .  Tetrahydrozoline HCl (VISINE OP), Apply 1 drop to eye daily as needed (irritation)., Disp: , Rfl:  .  DULoxetine (CYMBALTA) 30 MG capsule, Take 1  capsule (30 mg total) by mouth daily., Disp: 60 capsule, Rfl: 1 .  [START ON  06/14/2018] oxyCODONE-acetaminophen (PERCOCET) 10-325 MG tablet, Take 1 tablet by mouth 2 (two) times daily as needed for pain., Disp: 60 tablet, Rfl: 0  ROS  Constitutional: Denies any fever or chills Gastrointestinal: No reported hemesis, hematochezia, vomiting, or acute GI distress Musculoskeletal: Denies any acute onset joint swelling, redness, loss of ROM, or weakness Neurological: No reported episodes of acute onset apraxia, aphasia, dysarthria, agnosia, amnesia, paralysis, loss of coordination, or loss of consciousness  Allergies  Kimberly [name] is allergic to shellfish allergy; peanut-containing drug products; and codeine.  [place]  Drug: Ms. Lahey  reports that she does not use drugs. Alcohol:  reports that she does not drink alcohol. Tobacco:  reports that she has never smoked. She has never used smokeless tobacco. Medical:  has a past medical history of Anxiety, Arthritis, Asthma, Balance problem, Brain cyst, Depression, Difficult intubation, GERD (gastroesophageal reflux disease), Headache, Herpes, History of kidney stones, History of pneumonia, Hypertension, Inflammation of shoulder joint, Memory difficulties, Pneumothorax on right, PONV (postoperative nausea and vomiting), Recurrent falls, Shortness of breath dyspnea, and Urinary frequency. Surgical: Kimberly [name]  has a past surgical history that includes Abdominal hysterectomy; Rotator cuff repair (Left); Foreign Body Removal (Left); Carpal tunnel release (Left, 02/16/2013); Carpal tunnel release (Right, 03/27/2013); Cesarean section; Anterior cervical decomp/discectomy fusion (N/A, 08/06/2014); Colonoscopy; Nasal sinus surgery (N/A, 04/13/2015); Shoulder arthroscopy with distal clavicle resection (Left, 05/18/2015); Shoulder acromioplasty (Left, 05/18/2015); Cholecystectomy (N/A, 06/17/2015); Neck surgery (2016); Anterior cervical decomp/discectomy fusion (N/A,  07/04/2016); and Posterior cervical fusion/foraminotomy (N/A, 11/13/2016). Family: family history includes Hypertension in her father and other.  Constitutional Exam  General appearance: Well nourished, well developed, and well hydrated. In no apparent acute distress Vitals:   05/29/18 1114  BP: (!) 121/93  Pulse: 98  Resp: 16  Temp: 97.6 F (36.4 C)  TempSrc: Oral  SpO2: 99%  Weight: 148 lb (67.1 kg)  Height: 5' 1" (1.549 m)  Psych/Mental status: Alert, oriented x 3 (person, place, & time)       Eyes: PERLA Respiratory: No evidence of acute respiratory distress  Cervical Spine Area Exam  Skin & Axial Inspection: Well healed scar from previous spine surgery detected Alignment: Symmetrical Functional ROM: Adequate ROM      Stability: No instability detected Muscle Tone/Strength: Functionally intact. No obvious neuro-muscular anomalies detected. Sensory (Neurological): Unimpaired Palpation: No palpable anomalies              Upper Extremity (UE) Exam    Side: Right upper extremity  Side: Left upper extremity  Skin & Extremity Inspection: Skin color, temperature, and hair growth are WNL. No peripheral edema or cyanosis. No masses, redness, swelling, asymmetry, or associated skin lesions. No contractures.  Skin & Extremity Inspection: Skin color, temperature, and hair growth are WNL. No peripheral edema or cyanosis. No masses, redness, swelling, asymmetry, or associated skin lesions. No contractures.  Functional ROM: Unrestricted ROM          Functional ROM: Unrestricted ROM          Muscle Tone/Strength: Functionally intact. No obvious neuro-muscular anomalies detected.  Muscle Tone/Strength: Functionally intact. No obvious neuro-muscular anomalies detected.  Sensory (Neurological): Unimpaired          Sensory (Neurological): Unimpaired          Palpation: No palpable anomalies              Palpation: No palpable anomalies              Provocative Test(s):  Phalen's test:  deferred  Tinel's test: deferred Apley's scratch test (touch opposite shoulder):  Action 1 (Across chest): deferred Action 2 (Overhead): deferred Action 3 (LB reach): deferred   Provocative Test(s):  Phalen's test: deferred Tinel's test: deferred Apley's scratch test (touch opposite shoulder):  Action 1 (Across chest): deferred Action 2 (Overhead): deferred Action 3 (LB reach): deferred     Gait & Posture Assessment  Ambulation: Unassisted Gait: Relatively normal for age and body habitus Posture: WNL   Lower Extremity Exam    Side: Right lower extremity  Side: Left lower extremity  Stability: No instability observed          Stability: No instability observed          Skin & Extremity Inspection: Skin color, temperature, and hair growth are WNL. No peripheral edema or cyanosis. No masses, redness, swelling, asymmetry, or associated skin lesions. No contractures.  Skin & Extremity Inspection: Skin color, temperature, and hair growth are WNL. No peripheral edema or cyanosis. No masses, redness, swelling, asymmetry, or associated skin lesions. No contractures.  Functional ROM: Unrestricted ROM                  Functional ROM: Unrestricted ROM                  Muscle Tone/Strength: Functionally intact. No obvious neuro-muscular anomalies detected.  Muscle Tone/Strength: Functionally intact. No obvious neuro-muscular anomalies detected.  Sensory (Neurological): Unimpaired  Sensory (Neurological): Unimpaired  Palpation: No palpable anomalies  Palpation: No palpable anomalies   Assessment  Primary Diagnosis & Pertinent Problem List: The primary encounter diagnosis was Cervical spondylosis without myelopathy. Diagnoses of Cervicalgia, Cervical fusion syndrome, and Chronic pain syndrome were also pertinent to this visit.  Status Diagnosis  Persistent Persistent Persistent 1. Cervical spondylosis without myelopathy   2. Cervicalgia   3. Cervical fusion syndrome   4. Chronic pain  syndrome     Problems updated and reviewed during this visit: Problem  Pelvic Pain in Female  Depression, Major, Recurrent, Mild ([facility])  Poorly-Controlled Hypertension  Depression, Major, Single Episode, Complete Remission ([facility])  Vitamin D Deficiency   Plan of Care  Pharmacotherapy (Medications Ordered): Meds ordered this encounter  Medications  . oxyCODONE-acetaminophen (PERCOCET) 10-325 MG tablet    Sig: Take 1 tablet by mouth 2 (two) times daily as needed for pain.    Dispense:  60 tablet    Refill:  0    Do not place this medication, or any other prescription from our practice, on "Automatic Refill". Patient may have prescription filled one day early if pharmacy is closed on scheduled refill date.    Order Specific Question:   Supervising Provider    Answer:   Milinda Pointer [phone]  . DULoxetine (CYMBALTA) 30 MG capsule    Sig: Take 1 capsule (30 mg total) by mouth daily.    Dispense:  60 capsule    Refill:  1    Order Specific Question:   Supervising Provider    Answer:   Milinda Pointer [phone]  . oxyCODONE-acetaminophen (PERCOCET) 10-325 MG tablet    Sig: Take 1 tablet by mouth 2 (two) times daily as needed for pain.    Dispense:  60 tablet    Refill:  0    Do not place this medication, or any other prescription from our practice, on "Automatic Refill". Patient may have prescription filled one day early if pharmacy is closed on scheduled refill date.    Order Specific Question:   Supervising Provider  AnswerMilinda Pointer [[id number]]   New Prescriptions   No medications on file   Medications administered today: Kimberly [name] had no medications administered during this visit. Lab-work, procedure(s), and/or referral(s): No orders of the defined types were placed in this encounter.  Imaging and/or referral(s): None  Interventional therapies: Planned, scheduled, and/or pending:   Not at this time.  Provider-requested follow-up: Return in about 2  months (around 07/29/2018) for MedMgmt.  Future Appointments  Date Time Provider [place]  08/25/2018 11:00 AM Vevelyn Francois, NP [facility] None   Primary Care Physician: Glendon Axe, MD Location: [facility] Note by: Vevelyn Francois NP Date: 05/29/2018; Time: 3:55 PM  Pain Score Disclaimer: We use the NRS-11 scale. This is a self-reported, subjective measurement of pain severity with only modest accuracy. It is used primarily to identify changes within a particular patient. It must be understood that outpatient pain scales are significantly less accurate that those used for research, where they can be applied under ideal controlled circumstances with minimal exposure to variables. In reality, the score is likely to be a combination of pain intensity and pain affect, where pain affect describes the degree of emotional arousal or changes in action readiness caused by the sensory experience of pain. Factors such as social and work situation, setting, emotional state, anxiety levels, expectation, and prior pain experience may influence pain perception and show large inter-individual differences that may also be affected by time variables.  Patient instructions provided during this appointment: Patient Instructions  ____________________________________________________________________________________________  Medication Rules  Applies to: All patients receiving prescriptions (written or electronic).  Pharmacy of record: Pharmacy where electronic prescriptions will be sent. If written prescriptions are taken to a different pharmacy, please inform the nursing staff. The pharmacy listed in the electronic medical record should be the one where you would like electronic prescriptions to be sent.  Prescription refills: Only during scheduled appointments. Applies to both, written and electronic prescriptions.  NOTE: The following applies primarily to controlled  substances (Opioid* Pain Medications).   Patient's responsibilities: 1. Pain Pills: Bring all pain pills to every appointment (except for procedure appointments). 2. Pill Bottles: Bring pills in original pharmacy bottle. Always bring newest bottle. Bring bottle, even if empty. 3. Medication refills: You are responsible for knowing and keeping track of what medications you need refilled. The day before your appointment, write a list of all prescriptions that need to be refilled. Bring that list to your appointment and give it to the admitting nurse. Prescriptions will be written only during appointments. If you forget a medication, it will not be "Called in", "Faxed", or "electronically sent". You will need to get another appointment to get these prescribed. 4. Prescription Accuracy: You are responsible for carefully inspecting your prescriptions before leaving our office. Have the discharge nurse carefully go over each prescription with you, before taking them home. Make sure that your name is accurately spelled, that your address is correct. Check the name and dose of your medication to make sure it is accurate. Check the number of pills, and the written instructions to make sure they are clear and accurate. Make sure that you are given enough medication to last until your next medication refill appointment. 5. Taking Medication: Take medication as prescribed. Never take more pills than instructed. Never take medication more frequently than prescribed. Taking less pills or less frequently is permitted and encouraged, when it comes to controlled substances (written prescriptions).  6. Inform other  Doctors: Always inform, all of your healthcare providers, of all the medications you take. 7. Pain Medication from other Providers: You are not allowed to accept any additional pain medication from any other Doctor or Healthcare provider. There are two exceptions to this rule. (see below) In the event that you  require additional pain medication, you are responsible for notifying us, as stated below. 8. Medication Agreement: You are responsible for carefully reading and following our Medication Agreement. This must be signed before receiving any prescriptions from our practice. Safely store a copy of your signed Agreement. Violations to the Agreement will result in no further prescriptions. (Additional copies of our Medication Agreement are available upon request.) 9. Laws, Rules, & Regulations: All patients are expected to follow all Federal and [employer], [employer], Rules, [employer]. Ignorance of the Laws does not constitute a valid excuse. The use of any illegal substances is prohibited. 10. Adopted CDC guidelines & recommendations: Target dosing levels will be at or below 60 MME/day. Use of benzodiazepines** is not recommended.  Exceptions: There are only two exceptions to the rule of not receiving pain medications from other Healthcare Providers. 1. Exception #1 (Emergencies): In the event of an emergency (i.e.: accident requiring emergency care), you are allowed to receive additional pain medication. However, you are responsible for: As soon as you are able, call our office [phone], at any time of the day or night, and leave a message stating your name, the date and nature of the emergency, and the name and dose of the medication prescribed. In the event that your call is answered by a member of our staff, make sure to document and save the date, time, and the name of the person that took your information.  2. Exception #2 (Planned Surgery): In the event that you are scheduled by another doctor or dentist to have any type of surgery or procedure, you are allowed (for a period no longer than 30 days), to receive additional pain medication, for the acute post-op pain. However, in this case, you are responsible for picking up a copy of our "Post-op Pain Management for Surgeons" handout, and giving it  to your surgeon or dentist. This document is available at our office, and does not require an appointment to obtain it. Simply go to our office during business hours (Monday-Thursday from 8:00 AM to 4:00 PM) (Friday 8:00 AM to 12:00 Noon) or if you have a scheduled appointment with [place], prior to your surgery, and ask for it by name. In addition, you will need to provide [place] with your name, name of your surgeon, type of surgery, and date of procedure or surgery.  *Opioid medications include: morphine, codeine, oxycodone, oxymorphone, hydrocodone, hydromorphone, meperidine, tramadol, tapentadol, buprenorphine, fentanyl, methadone. **Benzodiazepine medications include: diazepam (Valium), alprazolam (Xanax), clonazepam (Klonopine), lorazepam (Ativan), clorazepate (Tranxene), chlordiazepoxide (Librium), estazolam (Prosom), oxazepam (Serax), temazepam (Restoril), triazolam (Halcion) (Last updated: 09/26/2017) ____________________________________________________________________________________________

## 2018-07-03 ENCOUNTER — Other Ambulatory Visit: Payer: Self-pay | Admitting: Gastroenterology

## 2018-07-03 DIAGNOSIS — K2100 Gastro-esophageal reflux disease with esophagitis, without bleeding: Secondary | ICD-10-CM

## 2018-07-16 DIAGNOSIS — B009 Herpesviral infection, unspecified: Secondary | ICD-10-CM | POA: Insufficient documentation

## 2018-07-18 ENCOUNTER — Other Ambulatory Visit: Payer: Self-pay | Admitting: Internal Medicine

## 2018-07-18 ENCOUNTER — Other Ambulatory Visit (HOSPITAL_COMMUNITY): Payer: Self-pay | Admitting: Internal Medicine

## 2018-07-18 DIAGNOSIS — R3129 Other microscopic hematuria: Secondary | ICD-10-CM

## 2018-07-28 ENCOUNTER — Ambulatory Visit: Payer: 59

## 2018-07-30 IMAGING — MG 2D DIGITAL DIAGNOSTIC UNILATERAL LEFT MAMMOGRAM WITH CAD AND ADJ
6 series · 6 of 14 positions shown · non-contrast
Comparison: Previous exam(s).

CLINICAL DATA: Six-month follow-up of probably benign
calcifications in the left breast upper outer quadrant.

EXAM:
2D DIGITAL DIAGNOSTIC UNILATERAL LEFT MAMMOGRAM WITH CAD AND ADJUNCT
TOMO

[L ML]
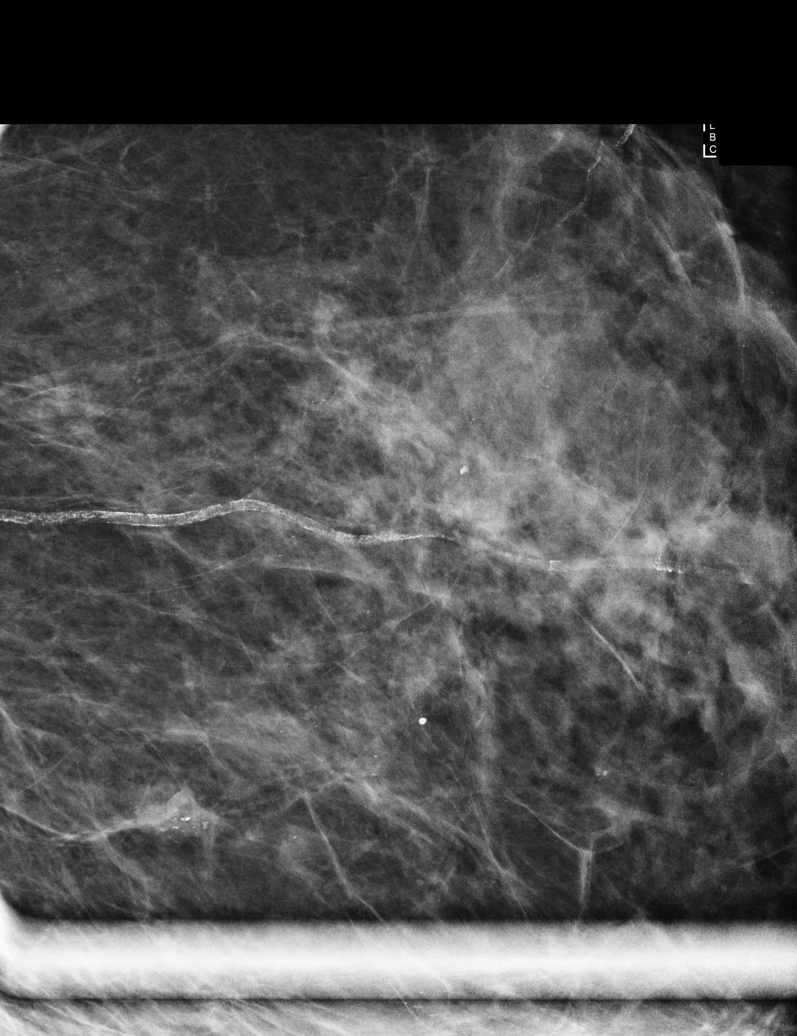

[L CC (1 of 2)]
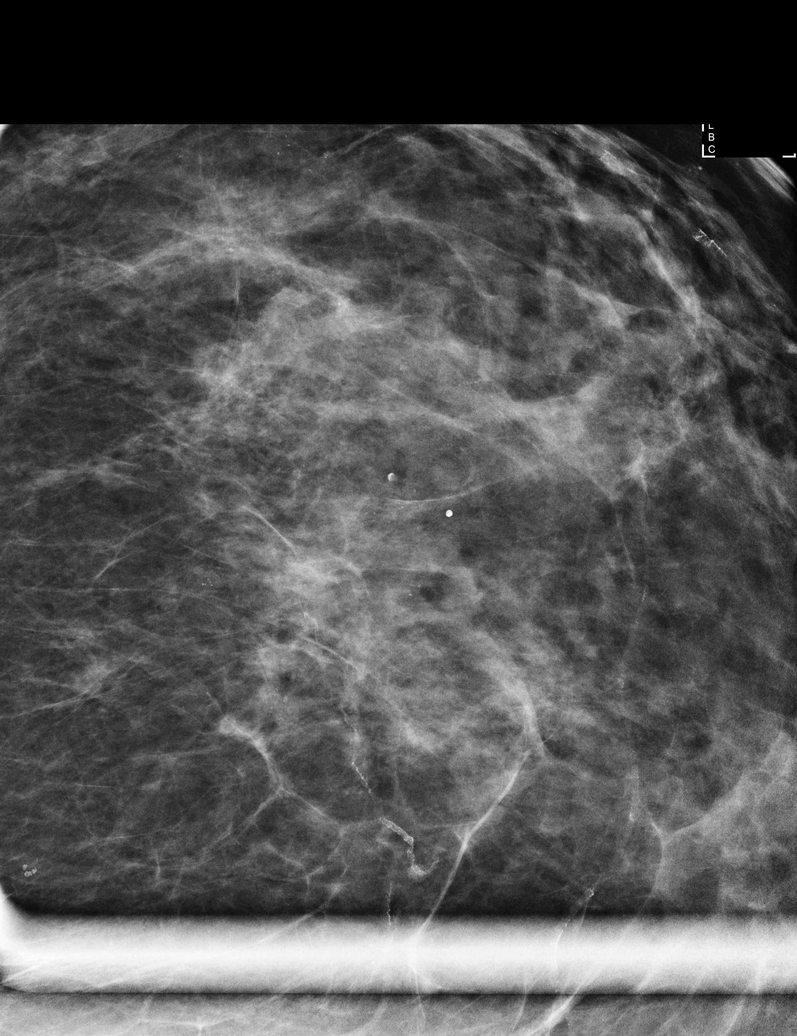

[L CC (2 of 2)]
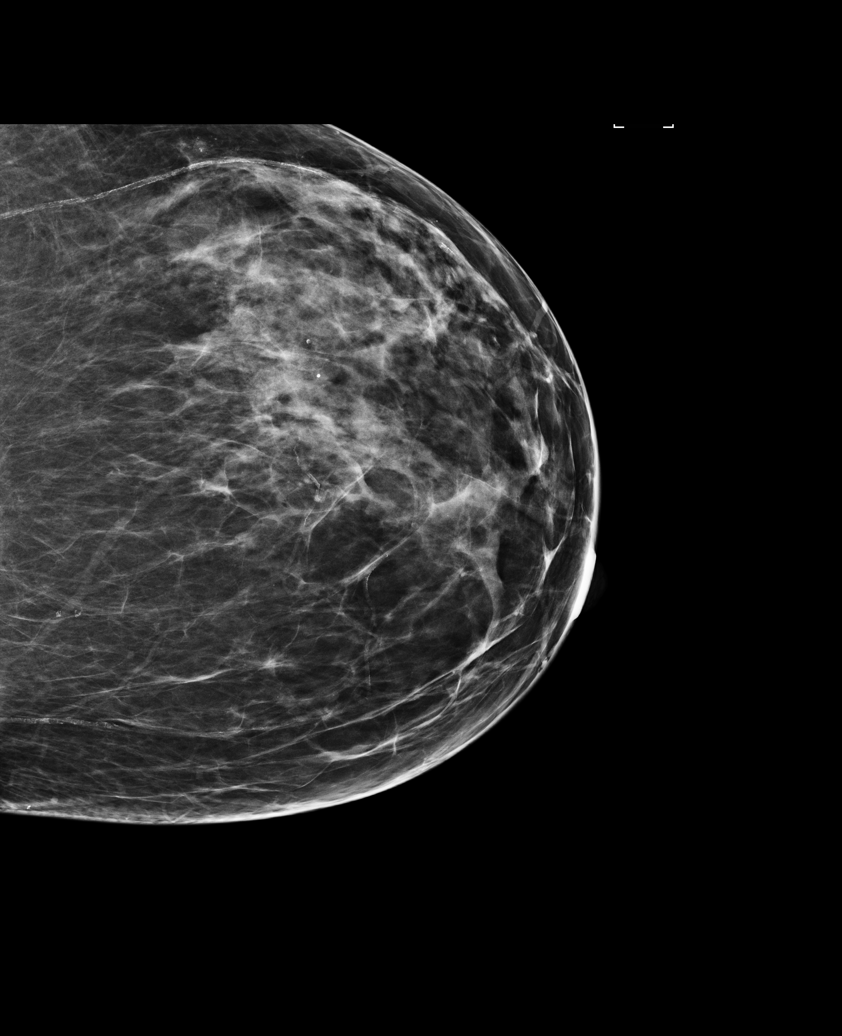

[L MLO]
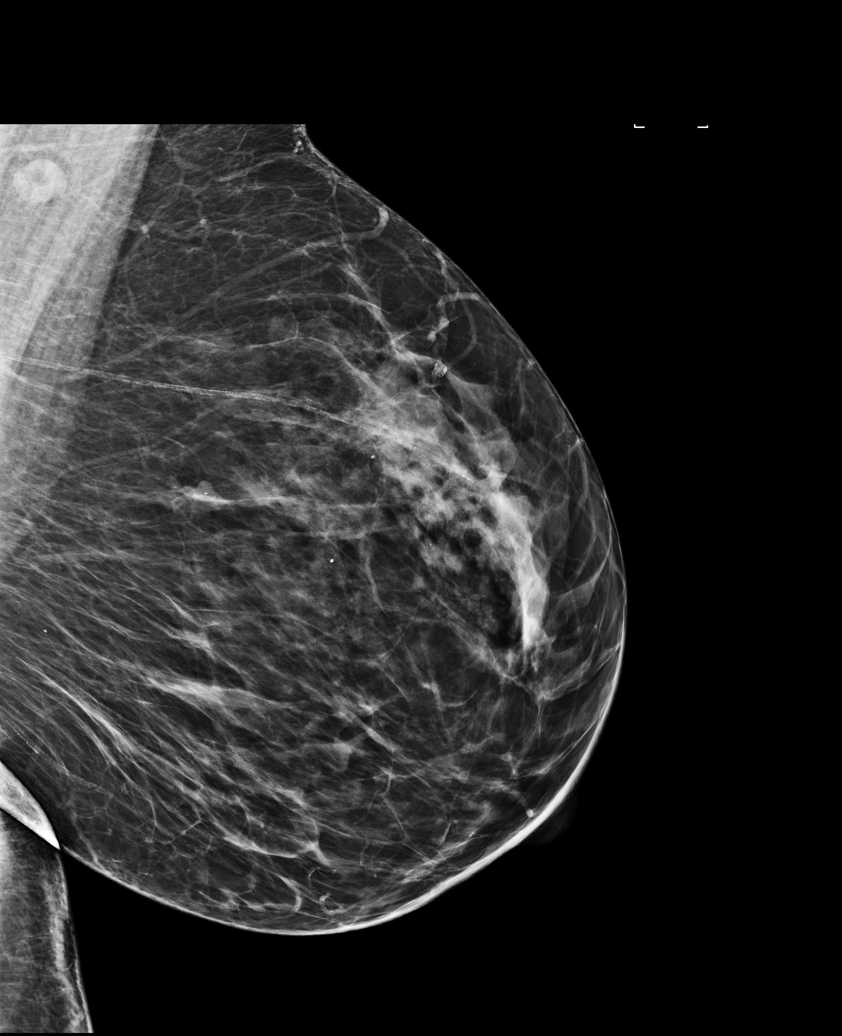

[L MLO tomo · tomo slice 39/76.0]
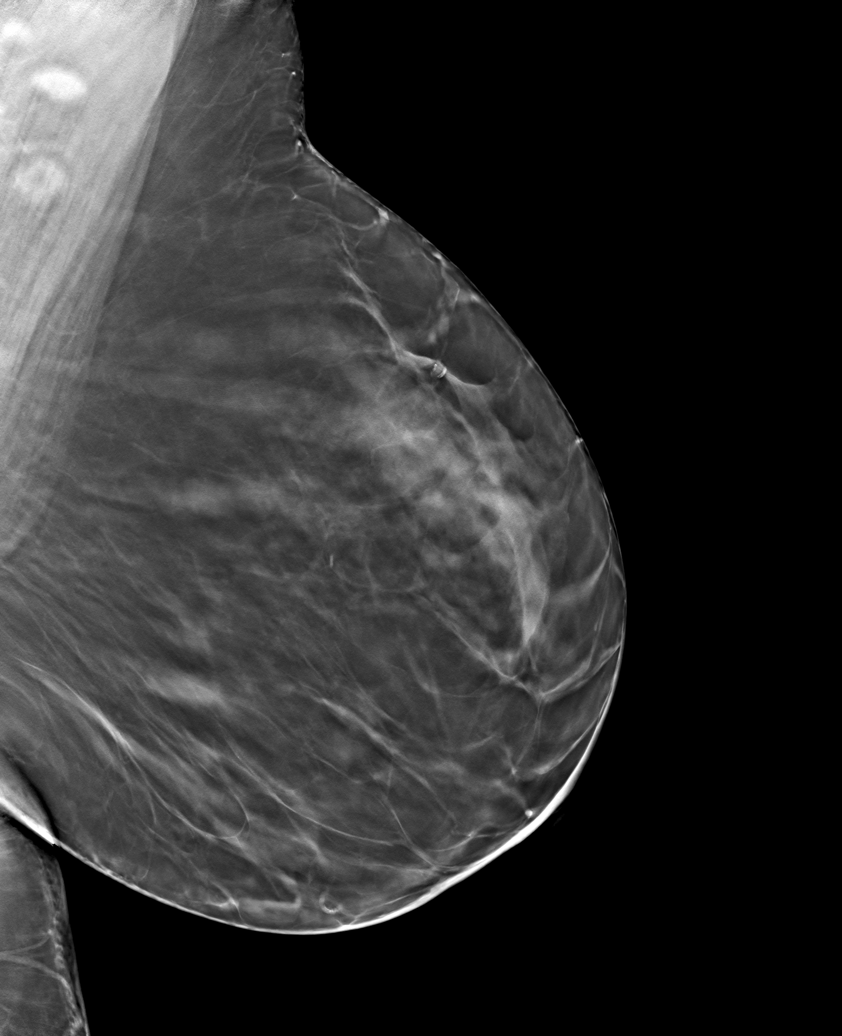

[L CC tomo · tomo slice 37/72.0]
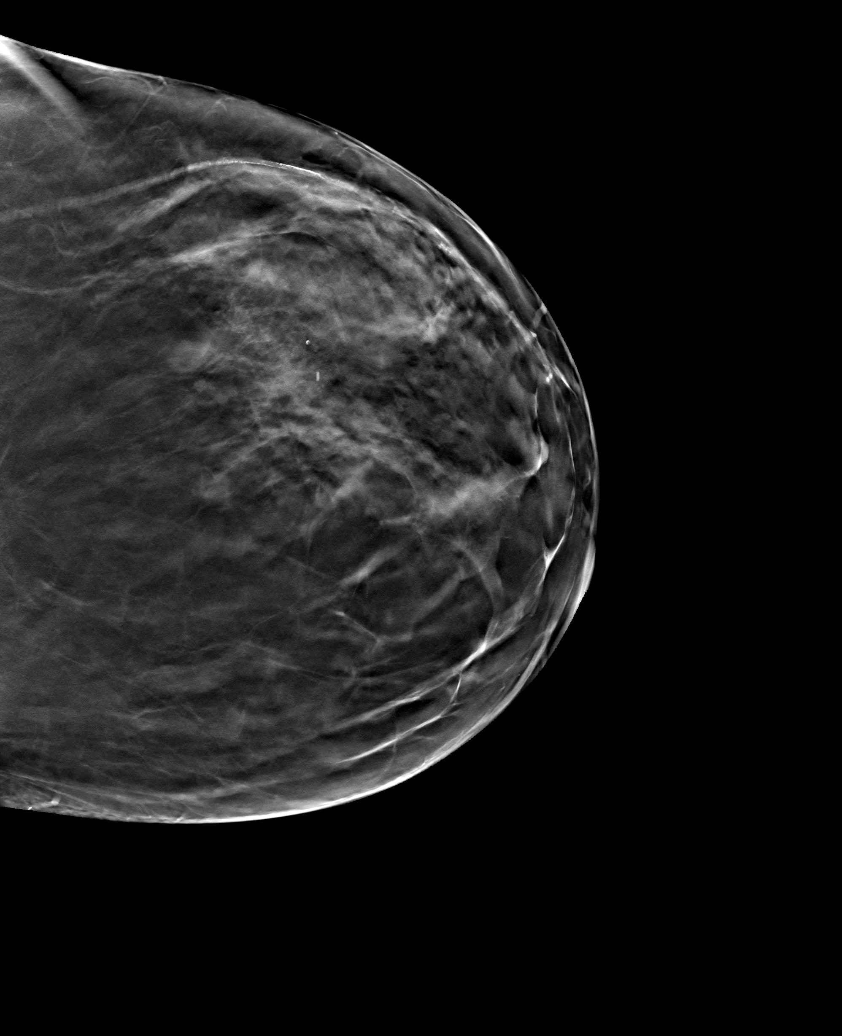

[6 of 14 positions shown; findings below may reference images not displayed]

ACR Breast Density Category b: There are scattered areas of
fibroglandular density.
FINDINGS: There are stable scattered few mm groups of loosely clustered
calcifications in the left breast upper outer quadrant. No
suspicious masses are seen.

Mammographic images were processed with CAD.
IMPRESSION: Stable probably benign calcifications of the left breast, for which
continued six-month follow-up is recommended.

RECOMMENDATION:
Diagnostic mammogram of the left breast in 6 months. (Code:[id number])

I have discussed the findings and recommendations with the patient.
Results were also provided in writing at the conclusion of the
visit. If applicable, a reminder letter will be sent to the patient
regarding the next appointment.

BI-RADS CATEGORY  3: Probably benign.

## 2018-07-31 ENCOUNTER — Other Ambulatory Visit: Payer: Self-pay | Admitting: Internal Medicine

## 2018-07-31 DIAGNOSIS — Z1231 Encounter for screening mammogram for malignant neoplasm of breast: Secondary | ICD-10-CM

## 2018-08-02 ENCOUNTER — Other Ambulatory Visit: Payer: Self-pay

## 2018-08-02 ENCOUNTER — Emergency Department
Admission: EM | Admit: 2018-08-02 | Discharge: 2018-08-02 | Disposition: A | Discharge status: Home / Self Care | Payer: 59 | Attending: Emergency Medicine | Admitting: Emergency Medicine

## 2018-08-02 ENCOUNTER — Encounter: Payer: Self-pay | Admitting: Emergency Medicine

## 2018-08-02 DIAGNOSIS — E039 Hypothyroidism, unspecified: Secondary | ICD-10-CM | POA: Insufficient documentation

## 2018-08-02 DIAGNOSIS — J45909 Unspecified asthma, uncomplicated: Secondary | ICD-10-CM | POA: Diagnosis not present

## 2018-08-02 DIAGNOSIS — Z79899 Other long term (current) drug therapy: Secondary | ICD-10-CM | POA: Diagnosis not present

## 2018-08-02 DIAGNOSIS — F329 Major depressive disorder, single episode, unspecified: Secondary | ICD-10-CM | POA: Diagnosis not present

## 2018-08-02 DIAGNOSIS — L03114 Cellulitis of left upper limb: Secondary | ICD-10-CM | POA: Diagnosis not present

## 2018-08-02 DIAGNOSIS — Z9049 Acquired absence of other specified parts of digestive tract: Secondary | ICD-10-CM | POA: Diagnosis not present

## 2018-08-02 DIAGNOSIS — I10 Essential (primary) hypertension: Secondary | ICD-10-CM | POA: Diagnosis not present

## 2018-08-02 DIAGNOSIS — F419 Anxiety disorder, unspecified: Secondary | ICD-10-CM | POA: Insufficient documentation

## 2018-08-02 DIAGNOSIS — M25512 Pain in left shoulder: Secondary | ICD-10-CM | POA: Diagnosis present

## 2018-08-02 DIAGNOSIS — L539 Erythematous condition, unspecified: Secondary | ICD-10-CM | POA: Insufficient documentation

## 2018-08-02 DIAGNOSIS — Z91010 Allergy to peanuts: Secondary | ICD-10-CM | POA: Insufficient documentation

## 2018-08-02 DIAGNOSIS — M659 Synovitis and tenosynovitis, unspecified: Secondary | ICD-10-CM | POA: Diagnosis not present

## 2018-08-02 MED ORDER — CEPHALEXIN 500 MG PO CAPS: 500.0000 mg | ORAL_CAPSULE | Freq: Three times a day (TID) | ORAL | 0 refills | Status: DC

## 2018-08-02 NOTE — ED Triage Notes (Signed)
Raised red area L shoulder where had tetanus shot 2 days ago.

## 2018-08-02 NOTE — Discharge Instructions (Addendum)
Follow-up with your regular doctor for recheck next week.  Take medication as prescribed.  Apply a ice pack to the left shoulder.  Also take Benadryl or Claritin to decrease inflammation.  Ibuprofen to decrease inflammation.

## 2018-08-02 NOTE — ED Provider Notes (Signed)
[facility] Emergency Department Provider Note  ____________________________________________   First MD Initiated Contact with Patient 08/02/18 1008     (approximate)  I have reviewed the triage vital signs and the nursing notes.   HISTORY  Chief Complaint Shoulder Pain    HPI Kimberly [name] is a 58 y.o. female presents emergency department complaining of left shoulder pain from where she got a tetanus shot 2 days ago.  States the area is hard and red.  Has a hive-like area on the shoulder.  She states it hurts to elevate the arm.  She denies any fever or chills.  No difficulty breathing.    Past Medical History:  Diagnosis Date  . Anxiety    takes alprazolam - rare use   . Arthritis    cervical spondylosis   . Asthma    SEES  PULM W/ [place]    . Balance problem   . Brain cyst   . Depression   . Difficult intubation    Small mouth opening, limited neck flexion, very anterior   . GERD (gastroesophageal reflux disease)    pt. reports that its better, no meds in use at this time- 2015  . Headache   . Herpes   . History of kidney stones   . History of pneumonia   . Hypertension    pt. doesn't see cardiologist, followed for HTN by Dr. Gerarda Fraction  . Inflammation of shoulder joint   . Memory difficulties   . Pneumothorax on right    following C4-6 ACDF 07/04/16  . PONV (postoperative nausea and vomiting)    WITH SURGERY 07/04/16 (NECK) RT LUNG COLLAPSED  . Recurrent falls   . Shortness of breath dyspnea    WITH EXERTION   . Urinary frequency     Patient Active Problem List   Diagnosis Date Noted  . Pelvic pain in female 05/29/2018  . Chronic pain syndrome 04/17/2018  . Cervicalgia 04/17/2018  . Controlled substance agreement signed 04/17/2018  . Cervical fusion syndrome 03/24/2018  . Hx of cervical spine surgery 03/24/2018  . Depression, major, recurrent, mild ([place]) 11/21/2017  . Poorly-controlled hypertension 11/21/2017  . Depression,  major, single episode, complete remission ([place]) 10/24/2017  . Degenerative disc disease, cervical 08/28/2017  . Vasovagal syncope 08/28/2017  . Dizziness 08/22/2017  . History of recent fall 08/22/2017  . Hypothyroidism (acquired) 08/13/2017  . Loss of memory 04/30/2017  . Reaction to severe stress 02/14/2017  . Pseudoarthrosis of cervical spine ([place]) 11/13/2016  . Asthma 07/05/2016  . Acute chest wall pain 07/05/2016  . GERD (gastroesophageal reflux disease) 07/05/2016  . Spontaneous pneumothorax   . Medication overuse headache 04/19/2016  . Chronic tension-type headache, not intractable 04/19/2016  . Numbness and tingling 04/19/2016  . Dysphagia 03/07/2016  . Vitamin D deficiency 02/07/2016  . Bilateral hand pain 02/06/2016  . Cervical stenosis of spinal canal 08/06/2014  . Muscle weakness (generalized) 11/12/2013  . Tight fascia 11/12/2013  . Decreased range of motion of shoulder 11/12/2013  . Cervical spondylosis without myelopathy 10/29/2013  . S/P carpal tunnel release 02/19/2013  . CTS (carpal tunnel syndrome) 02/19/2013  . SHOULDER PAIN 10/06/2007  . IMPINGEMENT SYNDROME 10/06/2007  . RUPTURE ROTATOR CUFF 10/06/2007  . HTN (hypertension) 10/03/2007    Past Surgical History:  Procedure Laterality Date  . ABDOMINAL HYSTERECTOMY    . ANTERIOR CERVICAL DECOMP/DISCECTOMY FUSION N/A 08/06/2014   Procedure: ANTERIOR CERVICAL DECOMPRESSION/DISCECTOMY FUSION CERVICAL THREE-FOUR,CERVICAL SIX-SEVEN ,CERVICAL SEVEN-THORACIC ONE;  Surgeon: Zigmund Daniel  Joya Salm, MD;  Location: [facility];  Service: Neurosurgery;  Laterality: N/A;  . ANTERIOR CERVICAL DECOMP/DISCECTOMY FUSION N/A 07/04/2016   Procedure: CERVICAL FOUR-FIVE, CERVICAL FIVE-SIX ANTERIOR CERVICAL DECOMPRESSION/DISCECTOMY/FUSION WITH REVISION OF CERVICAL THREE-FOUR PLATE;  Surgeon: Leeroy Cha, MD;  Location: [facility];  Service: Neurosurgery;  Laterality: N/A;  . CARPAL TUNNEL RELEASE Left 02/16/2013   Procedure: CARPAL TUNNEL RELEASE;   Surgeon: Carole Civil, MD;  Location: [facility];  Service: Orthopedics;  Laterality: Left;  . CARPAL TUNNEL RELEASE Right 03/27/2013   Procedure: RIGHT CARPAL TUNNEL RELEASE;  Surgeon: Carole Civil, MD;  Location: [facility];  Service: Orthopedics;  Laterality: Right;  . CESAREAN SECTION     x2  . CHOLECYSTECTOMY N/A 06/17/2015   Procedure: LAPAROSCOPIC CHOLECYSTECTOMY;  Surgeon: Aviva Signs, MD;  Location: [facility];  Service: General;  Laterality: N/A;  . COLONOSCOPY    . FOREIGN BODY REMOVAL Left    knee-as child  . NASAL SINUS SURGERY N/A 04/13/2015   Procedure: nasal endoscopy with adenoid biopsy;  Surgeon: Ruby Cola, MD;  Location: [facility];  Service: ENT;  Laterality: N/A;  . NECK SURGERY  2016  . POSTERIOR CERVICAL FUSION/FORAMINOTOMY N/A 11/13/2016   Procedure: CERVICAL TWO-CERVICAL SIX POSTERIOR CERVICAL FUSION WITH LATERAL MASS FIXATION;  Surgeon: Kristeen Miss, MD;  Location: [facility];  Service: Neurosurgery;  Laterality: N/A;  posterior approach  . ROTATOR CUFF REPAIR Left   . SHOULDER ACROMIOPLASTY Left 05/18/2015   Procedure: SHOULDER ACROMIOPLASTY;  Surgeon: Earlie Server, MD;  Location: [facility];  Service: Orthopedics;  Laterality: Left;  . SHOULDER ARTHROSCOPY WITH DISTAL CLAVICLE RESECTION Left 05/18/2015   Procedure: LEFT SHOULDER ARTHROSCOPY WITH  DISTAL CLAVICLE RESECTION;  Surgeon: Earlie Server, MD;  Location: [facility];  Service: Orthopedics;  Laterality: Left;    Prior to Admission medications   Medication Sig Start Date End Date Taking? Authorizing Provider  acetaminophen (TYLENOL) 500 MG tablet Take 500 mg by mouth 2 (two) times daily as needed for headache.    [provider]  acyclovir (ZOVIRAX) 400 MG tablet Take 400 mg by mouth 2 (two) times daily.     [provider]  albuterol (PROVENTIL HFA;VENTOLIN HFA) 108 (90 Base) MCG/ACT inhaler Inhale 2 puffs into the lungs every 4 (four) hours as needed for wheezing  or shortness of breath. 10/03/16   Collene Gobble, MD  ALPRAZolam Duanne Moron) 0.5 MG tablet Take 0.5 mg by mouth 3 (three) times daily as needed for anxiety.    [provider]  amLODipine (NORVASC) 5 MG tablet Take 5 mg by mouth daily.     [provider]  Baclofen 5 MG TABS Take 10 mg by mouth 3 (three) times daily as needed. Patient taking differently: Take 10 mg by mouth 3 (three) times daily as needed (spasms).  02/04/17   Nat Christen, MD  cephALEXin (KEFLEX) 500 MG capsule Take 1 capsule (500 mg total) by mouth 3 (three) times daily. 08/02/18   Yolando Gillum, Linden Dolin, PA-C  Cholecalciferol (VITAMIN D-1000 MAX ST) 1000 units tablet Take 1,000 Units by mouth daily.    [provider]  clotrimazole (LOTRIMIN) 1 % cream Apply 1 application topically daily as needed (itching).     [provider]  cyclobenzaprine (FLEXERIL) 10 MG tablet Take 1 tablet by mouth as needed for muscle spasms.     [provider]  diazepam (VALIUM) 10 MG tablet Take 10 mg by mouth daily as needed for anxiety.  [provider]  DULoxetine (CYMBALTA) 30 MG capsule Take 1 capsule (30 mg total) by mouth daily. 05/29/18 07/28/18  Vevelyn Francois, NP  escitalopram (LEXAPRO) 10 MG tablet Take 10 mg by mouth daily as needed (anxiety).     [provider]  estradiol (ESTRACE) 1 MG tablet Take 1 mg by mouth daily.     [provider]  gabapentin (NEURONTIN) 100 MG capsule Take 200 mg by mouth every 8 (eight) hours. 01/04/15   [provider]  liothyronine (CYTOMEL) 5 MCG tablet Take 1 tablet by mouth daily. 03/11/18   [provider]  nortriptyline (PAMELOR) 10 MG capsule Take 1 capsule by mouth at bedtime as needed for sleep.  08/07/16   [provider]  nystatin (MYCOSTATIN) 100000 UNIT/ML suspension Take 100,000 Units by mouth daily. Swish and spit 03/13/18   [provider]  oxybutynin (DITROPAN-XL) 5 MG 24 hr tablet Take 5 mg by mouth 2  (two) times daily.     [provider]  oxyCODONE-acetaminophen (PERCOCET) 10-325 MG tablet Take 1 tablet by mouth 2 (two) times daily as needed for pain. 07/14/18 08/13/18  Vevelyn Francois, NP  pantoprazole (PROTONIX) 40 MG tablet Take 40 mg by mouth daily.    [provider]  Tetrahydrozoline HCl (VISINE OP) Apply 1 drop to eye daily as needed (irritation).    [provider]    Allergies Shellfish allergy; Peanut-containing drug products; and Codeine  Family History  Problem Relation Age of Onset  . Hypertension Father   . Hypertension Other     Social History Social History   Tobacco Use  . Smoking status: Never Smoker  . Smokeless tobacco: Never Used  Substance Use Topics  . Alcohol use: No  . Drug use: No    Review of Systems  Constitutional: No fever/chills Eyes: No visual changes. ENT: No sore throat. Respiratory: Denies cough Genitourinary: Negative for dysuria. Musculoskeletal: Negative for back pain. Skin: Positive for raised red area with tenderness on the left shoulder    ____________________________________________   PHYSICAL EXAM:  VITAL SIGNS: ED Triage Vitals  Enc Vitals Group     BP 08/02/18 0942 111/79     Pulse Rate 08/02/18 0942 100     Resp 08/02/18 0942 20     Temp 08/02/18 0942 97.7 F (36.5 C)     Temp Source 08/02/18 0942 Oral     SpO2 08/02/18 0942 98 %     Weight 08/02/18 0943 149 lb (67.6 kg)     Height 08/02/18 0943 5\' 1"  (1.549 m)     Head Circumference --      Peak Flow --      Pain Score 08/02/18 0943 8     Pain Loc --      Pain Edu? --      Excl. in [place]? --     Constitutional: Alert and oriented. Well appearing and in no acute distress. Eyes: Conjunctivae are normal.  Head: Atraumatic. Nose: No congestion/rhinnorhea. Mouth/Throat: Mucous membranes are moist.   Neck:  supple no lymphadenopathy noted Cardiovascular: Normal rate, regular rhythm. Heart sounds are normal Respiratory: Normal  respiratory effort.  No retractions, lungs c t a  GU: deferred Musculoskeletal: Decreased range of motion of the left shoulder secondary to pain and discomfort, left shoulder is tender along the bicep tendon, swelling is noted, small area of redness is noted for the injection was given.  Neurologic:  Normal speech and language.  Skin:  Skin  is warm, dry and intact.  Redness at injection site at the left shoulder Psychiatric: Mood and affect are normal. Speech and behavior are normal.  ____________________________________________   LABS (all labs ordered are listed, but only abnormal results are displayed)  Labs Reviewed - No data to display ____________________________________________   ____________________________________________  RADIOLOGY    ____________________________________________   PROCEDURES  Procedure(s) performed: No  Procedures    ____________________________________________   INITIAL IMPRESSION / ASSESSMENT AND PLAN / ED COURSE  Pertinent labs & imaging results that were available during my care of the patient were reviewed by me and considered in my medical decision making (see chart for details).   Patient is 58 year old female presents emergency department complaining of left shoulder pain due to a recent Tdap injection.  The left shoulder has a swollen area noted below the bicep tendon and the bicep tendon appears to be affected.  There is redness noted at the injection site.  Explained findings to the patient.  She was placed on Keflex 500 3 times daily.  She is to take ibuprofen, Claritin, Benadryl, and apply ice pack to the area.  Follow-up with her regular doctor next week for recheck.  Explained to her that I do not want to start steroids at this time as it will delete vaccine.  She states she understands and will comply.  She was discharged in stable condition.     As part of my medical decision making, I reviewed the following data within the  [facility] notes reviewed and incorporated, Old chart reviewed, Notes from prior ED visits and [place] Controlled Substance Database  ____________________________________________   FINAL CLINICAL IMPRESSION(S) / ED DIAGNOSES  Final diagnoses:  Tenosynovitis  Cellulitis of left upper extremity  Erythema at injection site      NEW MEDICATIONS STARTED DURING THIS VISIT:  Discharge Medication List as of 08/02/2018 10:16 AM    START taking these medications   Details  cephALEXin (KEFLEX) 500 MG capsule Take 1 capsule (500 mg total) by mouth 3 (three) times daily., Starting Sat 08/02/2018, Normal         Note:  This document was prepared using Dragon voice recognition software and may include unintentional dictation errors.    Versie Starks, PA-C 08/02/18 1031    Harvest Dark, MD 08/02/18 [phone]

## 2018-08-04 ENCOUNTER — Ambulatory Visit
Admission: RE | Admit: 2018-08-04 | Discharge: 2018-08-04 | Disposition: A | Discharge status: Home / Self Care | Payer: 59 | Source: Ambulatory Visit | Attending: Internal Medicine | Admitting: Internal Medicine

## 2018-08-04 DIAGNOSIS — R3129 Other microscopic hematuria: Secondary | ICD-10-CM

## 2018-08-04 LAB — POCT I-STAT CREATININE: CREATININE: 0.6 mg/dL (ref 0.44–1.00)

## 2018-08-04 MED ORDER — IOPAMIDOL (ISOVUE-300) INJECTION 61%
100.0000 mL | Freq: Once | INTRAVENOUS | Status: AC | PRN
  Administered 2018-08-04: 100 mL via INTRAVENOUS

## 2018-08-07 ENCOUNTER — Ambulatory Visit
Admission: RE | Admit: 2018-08-07 | Discharge: 2018-08-07 | Disposition: A | Discharge status: Home / Self Care | Payer: 59 | Source: Ambulatory Visit | Attending: Urology | Admitting: Urology

## 2018-08-07 ENCOUNTER — Encounter: Payer: Self-pay | Admitting: Urology

## 2018-08-07 ENCOUNTER — Ambulatory Visit (INDEPENDENT_AMBULATORY_CARE_PROVIDER_SITE_OTHER): Payer: 59 | Admitting: Urology

## 2018-08-07 VITALS — BP 143/89 | HR 108 | Ht 60.0 in | Wt 151.0 lb

## 2018-08-07 DIAGNOSIS — N200 Calculus of kidney: Secondary | ICD-10-CM | POA: Insufficient documentation

## 2018-08-07 DIAGNOSIS — R310 Gross hematuria: Secondary | ICD-10-CM

## 2018-08-07 LAB — MICROSCOPIC EXAMINATION

## 2018-08-07 LAB — URINALYSIS, COMPLETE
BILIRUBIN UA: NEGATIVE
Glucose, UA: NEGATIVE
KETONES UA: NEGATIVE
Leukocytes, UA: NEGATIVE
NITRITE UA: NEGATIVE
Protein, UA: NEGATIVE
SPEC GRAV UA: 1.02 (ref 1.005–1.030)
Urobilinogen, Ur: 0.2 mg/dL (ref 0.2–1.0)
pH, UA: 6 (ref 5.0–7.5)

## 2018-08-07 NOTE — Addendum Note (Signed)
Addended by: Tommy Rainwater on: 08/07/2018 10:17 AM   Modules accepted: Orders

## 2018-08-07 NOTE — Progress Notes (Addendum)
08/07/2018 9:54 AM   Kimberly [name] Jan 09, 1961 [id number]  Referring provider: Glendon Axe, MD [place], Kimberly [name] [address]  CC: Microscopic hematuria  HPI: I saw Kimberly [name] in urology clinic today in consultation for microscopic hematuria from Kimberly [name]. She is a very nice 58 year old African-American female who was noted to have 10-50 RBCs per high-power field on urinalysis in October 2019.  On chart review, this is been present since at least December 2018.  She denies any gross hematuria.  She is a non-smoker.  She denies any carcinogenic exposures.  She is disabled, and previously worked in [employer].  She has a history of 3 spontaneously passed kidney stones, all of which were extremely painful for her.  She is already had CT urogram performed, which showed no significant urologic findings aside from a 7 mm non-obstructive left lower pole renal stone.  She denies flank pain, fevers, chest pain, or shortness of breath.  There are no aggravating or alleviating factors.  Severity is mild.   PMH: Past Medical History:  Diagnosis Date  . Anxiety    takes alprazolam - rare use   . Arthritis    cervical spondylosis   . Asthma    SEES  PULM W/ [place]    . Balance problem   . Brain cyst   . Depression   . Difficult intubation    Small mouth opening, limited neck flexion, very anterior   . GERD (gastroesophageal reflux disease)    pt. reports that its better, no meds in use at this time- 2015  . Headache   . Herpes   . History of kidney stones   . History of pneumonia   . Hypertension    pt. doesn't see cardiologist, followed for HTN by Dr. Gerarda [name]  . Inflammation of shoulder joint   . Memory difficulties   . Pneumothorax on right    following C4-6 ACDF 07/04/16  . PONV (postoperative nausea and vomiting)    WITH SURGERY 07/04/16 (NECK) RT LUNG COLLAPSED  . Recurrent falls   . Shortness of breath dyspnea    WITH EXERTION     . Urinary frequency     Surgical History: Past Surgical History:  Procedure Laterality Date  . ABDOMINAL HYSTERECTOMY    . ANTERIOR CERVICAL DECOMP/DISCECTOMY FUSION N/A 08/06/2014   Procedure: ANTERIOR CERVICAL DECOMPRESSION/DISCECTOMY FUSION CERVICAL THREE-FOUR,CERVICAL SIX-SEVEN ,CERVICAL SEVEN-THORACIC ONE;  Surgeon: Floyce Stakes, MD;  Location: [facility];  Service: Neurosurgery;  Laterality: N/A;  . ANTERIOR CERVICAL DECOMP/DISCECTOMY FUSION N/A 07/04/2016   Procedure: CERVICAL FOUR-FIVE, CERVICAL FIVE-SIX ANTERIOR CERVICAL DECOMPRESSION/DISCECTOMY/FUSION WITH REVISION OF CERVICAL THREE-FOUR PLATE;  Surgeon: Leeroy Cha, MD;  Location: [facility];  Service: Neurosurgery;  Laterality: N/A;  . CARPAL TUNNEL RELEASE Left 02/16/2013   Procedure: CARPAL TUNNEL RELEASE;  Surgeon: Carole Civil, MD;  Location: [facility];  Service: Orthopedics;  Laterality: Left;  . CARPAL TUNNEL RELEASE Right 03/27/2013   Procedure: RIGHT CARPAL TUNNEL RELEASE;  Surgeon: Carole Civil, MD;  Location: [facility];  Service: Orthopedics;  Laterality: Right;  . CESAREAN SECTION     x2  . CHOLECYSTECTOMY N/A 06/17/2015   Procedure: LAPAROSCOPIC CHOLECYSTECTOMY;  Surgeon: Aviva Signs, MD;  Location: [facility];  Service: General;  Laterality: N/A;  . COLONOSCOPY    . FOREIGN BODY REMOVAL Left    knee-as child  . NASAL SINUS SURGERY N/A 04/13/2015   Procedure: nasal endoscopy with adenoid biopsy;  Surgeon: Ruby Cola,  MD;  Location: [facility];  Service: ENT;  Laterality: N/A;  . NECK SURGERY  2016  . POSTERIOR CERVICAL FUSION/FORAMINOTOMY N/A 11/13/2016   Procedure: CERVICAL TWO-CERVICAL SIX POSTERIOR CERVICAL FUSION WITH LATERAL MASS FIXATION;  Surgeon: Kristeen Miss, MD;  Location: [facility];  Service: Neurosurgery;  Laterality: N/A;  posterior approach  . ROTATOR CUFF REPAIR Left   . SHOULDER ACROMIOPLASTY Left 05/18/2015   Procedure: SHOULDER ACROMIOPLASTY;  Surgeon: Earlie Server, MD;  Location: [facility];  Service: Orthopedics;  Laterality: Left;  . SHOULDER ARTHROSCOPY WITH DISTAL CLAVICLE RESECTION Left 05/18/2015   Procedure: LEFT SHOULDER ARTHROSCOPY WITH  DISTAL CLAVICLE RESECTION;  Surgeon: Earlie Server, MD;  Location: [facility];  Service: Orthopedics;  Laterality: Left;    Allergies:  Allergies  Allergen Reactions  . Shellfish Allergy Anaphylaxis and Hives    "Swells throat up"  . Peanut-Containing Drug Products Hives  . Codeine Other (See Comments)    jitters  . Diphtheria Toxoid Rash  . Tetanus Toxoids Rash    Family History: Family History  Problem Relation Age of Onset  . Hypertension Father   . Hypertension Other     Social History:  reports that she has never smoked. She has never used smokeless tobacco. She reports that she does not drink alcohol or use drugs.  ROS: Please see flowsheet from today's date for complete review of systems.  Physical Exam: BP (!) 143/89 (BP Location: Left Arm, Patient Position: Sitting)   Pulse (!) 108   Ht 5' (1.524 m)   Wt 151 lb (68.5 kg)   BMI 29.49 kg/m    Constitutional:  Alert and oriented, No acute distress. Cardiovascular: RRR Respiratory: CTA bilaterally GI: Abdomen is soft, nontender, nondistended, no abdominal masses GU: No CVA tenderness Lymph: No cervical or inguinal lymphadenopathy. Skin: No rashes, bruises or suspicious lesions. Neurologic: Grossly intact, no focal deficits, moving all 4 extremities. Psychiatric: Normal mood and affect.  Laboratory Data: Reviewed  Pertinent Imaging: I have personally reviewed the CT abdomen pelvis dated 08/04/2018.  There is no hydronephrosis, renal masses, or filling defects.  There is a 7 mm left lower pole non-obstructive renal stone, 850 HU, skin to stone distance 9 cm  Stone clearly visible on KUB today  Assessment & Plan:   In summary, Kimberly [name] is a healthy 58 year old female with microscopic hematuria and a 7 mm non-obstructing left  lower pole renal stone.  She desires intervention for her left-sided nonobstructive stone, in the setting of multiple spontaneously passed painful stones previously.  We discussed common possible etiologies of hematuria including BPH, malignancy, urolithiasis, medical renal disease, and idiopathic. Standard workup recommended by the AUA includes imaging with CT urogram to assess the upper tracts, and cystoscopy.  We discussed various treatment options for urolithiasis including observation with or without medical expulsive therapy, shockwave lithotripsy (SWL), ureteroscopy and laser lithotripsy with stent placement, and percutaneous nephrolithotomy.  We discussed that management is based on stone size, location, density, patient co-morbidities, and patient preference.   Stones <30mm in size have a >80% spontaneous passage rate. Data surrounding the use of tamsulosin for medical expulsive therapy is controversial, but meta analyses suggests it is most efficacious for distal stones between 5-92mm in size. Possible side effects include dizziness/lightheadedness, and retrograde ejaculation.  SWL has a lower stone free rate in a single procedure, but also a lower complication rate compared to ureteroscopy and avoids a stent and associated stent related symptoms. Possible complications include renal hematoma,  steinstrasse, and need for additional treatment.  Ureteroscopy with laser lithotripsy and stent placement has a higher stone free rate than SWL in a single procedure, however increased complication rate including possible infection, ureteral injury, bleeding, and stent related morbidity. Common stent related symptoms include dysuria, urgency/frequency, and flank pain.  After an extensive discussion of the risks and benefits of the above treatment options, the patient would like to proceed with LEFT SWL.  -Schedule LEFT SWL -RTC for cysto in 2-3 weeks to complete hematuria work-up  Billey Co,  MD  [facility] [address] [place], [place] [address] [phone]

## 2018-08-07 NOTE — Patient Instructions (Signed)
Lithotripsy  Lithotripsy is a treatment that can sometimes help eliminate kidney stones and the pain that they cause. A form of lithotripsy, also known as extracorporeal shock wave lithotripsy, is a nonsurgical procedure that crushes a kidney stone with shock waves. These shock waves pass through your body and focus on the kidney stone. They cause the kidney stone to break up while it is still in the urinary tract. This makes it easier for the smaller pieces of stone to pass in the urine. Tell a health care provider about:  Any allergies you have.  All medicines you are taking, including vitamins, herbs, eye drops, creams, and over-the-counter medicines.  Any blood disorders you have.  Any surgeries you have had.  Any medical conditions you have.  Whether you are pregnant or may be pregnant.  Any problems you or family members have had with anesthetic medicines. What are the risks? Generally, this is a safe procedure. However, problems may occur, including:  Infection.  Bleeding of the kidney.  Bruising of the kidney or skin.  Scarring of the kidney, which can lead to: ? Increased blood pressure. ? Poor kidney function. ? Return (recurrence) of kidney stones.  Damage to other structures or organs, such as the liver, colon, spleen, or pancreas.  Blockage (obstruction) of the the tube that carries urine from the kidney to the bladder (ureter).  Failure of the kidney stone to break into pieces (fragments). What happens before the procedure? Staying hydrated Follow instructions from your health care provider about hydration, which may include:  Up to 2 hours before the procedure - you may continue to drink clear liquids, such as water, clear fruit juice, black coffee, and plain tea. Eating and drinking restrictions Follow instructions from your health care provider about eating and drinking, which may include:  8 hours before the procedure - stop eating heavy meals or foods  such as meat, fried foods, or fatty foods.  6 hours before the procedure - stop eating light meals or foods, such as toast or cereal.  6 hours before the procedure - stop drinking milk or drinks that contain milk.  2 hours before the procedure - stop drinking clear liquids. General instructions  Plan to have someone take you home from the hospital or clinic.  Ask your health care provider about: ? Changing or stopping your regular medicines. This is especially important if you are taking diabetes medicines or blood thinners. ? Taking medicines such as aspirin and ibuprofen. These medicines and other NSAIDs can thin your blood. Do not take these medicines for 7 days before your procedure if your health care provider instructs you not to.  You may have tests, such as: ? Blood tests. ? Urine tests. ? Imaging tests, such as a CT scan. What happens during the procedure?  To lower your risk of infection: ? Your health care team will wash or sanitize their hands. ? Your skin will be washed with soap.  An IV tube will be inserted into one of your veins. This tube will give you fluids and medicines.  You will be given one or more of the following: ? A medicine to help you relax (sedative). ? A medicine to make you fall asleep (general anesthetic).  A water-filled cushion may be placed behind your kidney or on your abdomen. In some cases you may be placed in a tub of lukewarm water.  Your body will be positioned in a way that makes it easy to target the kidney   stone.  A flexible tube with holes in it (stent) may be placed in the ureter. This will help keep urine flowing from the kidney if the fragments of the stone have been blocking the ureter.  An X-ray or ultrasound exam will be done to locate your stone.  Shock waves will be aimed at the stone. If you are awake, you may feel a tapping sensation as the shock waves pass through your body. The procedure may vary among health care  providers and hospitals. What happens after the procedure?  You may have an X-ray to see whether the procedure was able to break up the kidney stone and how much of the stone has passed. If large stone fragments remain after treatment, you may need to have a second procedure at a later time.  Your blood pressure, heart rate, breathing rate, and blood oxygen level will be monitored until the medicines you were given have worn off.  You may be given antibiotics or pain medicine as needed.  If a stent was placed in your ureter during surgery, it may stay in place for a few weeks.  You may need strain your urine to collect pieces of the kidney stone for testing.  You will need to drink plenty of water.  Do not drive for 24 hours if you were given a sedative. Summary  Lithotripsy is a treatment that can sometimes help eliminate kidney stones and the pain that they cause.  A form of lithotripsy, also known as extracorporeal shock wave lithotripsy, is a nonsurgical procedure that crushes a kidney stone with shock waves.  Generally, this is a safe procedure. However, problems may occur, including damage to the kidney or other organs, infection, or obstruction of the tube that carries urine from the kidney to the bladder (ureter).  When you go home, you will need to drink plenty of water. You may be asked to strain your urine to collect pieces of the kidney stone for testing. This information is not intended to replace advice given to you by your health care provider. Make sure you discuss any questions you have with your health care provider. Document Released: 07/13/2000 Document Revised: 08/01/2017 Document Reviewed: 06/06/2016 Elsevier Interactive Patient Education  2019 [employer]  

## 2018-08-08 ENCOUNTER — Other Ambulatory Visit: Payer: Self-pay | Admitting: Radiology

## 2018-08-08 ENCOUNTER — Telehealth: Payer: Self-pay | Admitting: Radiology

## 2018-08-08 DIAGNOSIS — N200 Calculus of kidney: Secondary | ICD-10-CM

## 2018-08-08 NOTE — Telephone Encounter (Signed)
Notified patient of extracorporeal shockwave lithotripsy scheduled on 08/14/2018 at 7:15. Patient should arrive at that time to [facility] Day [facility]. Pre-op & discharge instructions for lithotripsy were provided to patient.    Advised patient to hold NSAIDS & aspirin products for 3 days prior to the procedure beginning on 08/11/2018.  Patient's questions were answered & she expresses understanding of these instructions.    Ranell Patrick, RN

## 2018-08-10 ENCOUNTER — Encounter: Payer: Self-pay | Admitting: Emergency Medicine

## 2018-08-10 ENCOUNTER — Other Ambulatory Visit: Payer: Self-pay

## 2018-08-10 ENCOUNTER — Emergency Department: Payer: 59

## 2018-08-10 ENCOUNTER — Emergency Department
Admission: EM | Admit: 2018-08-10 | Discharge: 2018-08-10 | Disposition: A | Discharge status: Home / Self Care | Payer: 59 | Attending: Emergency Medicine | Admitting: Emergency Medicine

## 2018-08-10 DIAGNOSIS — J45909 Unspecified asthma, uncomplicated: Secondary | ICD-10-CM | POA: Insufficient documentation

## 2018-08-10 DIAGNOSIS — I10 Essential (primary) hypertension: Secondary | ICD-10-CM | POA: Diagnosis not present

## 2018-08-10 DIAGNOSIS — J101 Influenza due to other identified influenza virus with other respiratory manifestations: Secondary | ICD-10-CM | POA: Diagnosis not present

## 2018-08-10 DIAGNOSIS — Z91010 Allergy to peanuts: Secondary | ICD-10-CM | POA: Insufficient documentation

## 2018-08-10 DIAGNOSIS — E039 Hypothyroidism, unspecified: Secondary | ICD-10-CM | POA: Diagnosis not present

## 2018-08-10 DIAGNOSIS — J111 Influenza due to unidentified influenza virus with other respiratory manifestations: Secondary | ICD-10-CM

## 2018-08-10 DIAGNOSIS — R05 Cough: Secondary | ICD-10-CM | POA: Diagnosis present

## 2018-08-10 DIAGNOSIS — Z79899 Other long term (current) drug therapy: Secondary | ICD-10-CM | POA: Insufficient documentation

## 2018-08-10 HISTORY — DX: Influenza due to unidentified influenza virus with other respiratory manifestations: J11.1

## 2018-08-10 LAB — GROUP A STREP BY PCR: Group A Strep by PCR: NOT DETECTED

## 2018-08-10 LAB — INFLUENZA PANEL BY PCR (TYPE A & B)
Influenza A By PCR: NEGATIVE
Influenza B By PCR: POSITIVE — AB

## 2018-08-10 MED ORDER — OSELTAMIVIR PHOSPHATE 75 MG PO CAPS: 75.0000 mg | ORAL_CAPSULE | Freq: Two times a day (BID) | ORAL | 0 refills | Status: AC

## 2018-08-10 MED ORDER — IPRATROPIUM-ALBUTEROL 0.5-2.5 (3) MG/3ML IN SOLN
3.0000 mL | Freq: Once | RESPIRATORY_TRACT | Status: AC
  Administered 2018-08-10: 3 mL via RESPIRATORY_TRACT
  Filled 2018-08-10: qty 3

## 2018-08-10 MED ORDER — BENZONATATE 100 MG PO CAPS: 100.0000 mg | ORAL_CAPSULE | Freq: Three times a day (TID) | ORAL | 0 refills | Status: DC | PRN

## 2018-08-10 MED ORDER — ONDANSETRON HCL 4 MG PO TABS: 4.0000 mg | ORAL_TABLET | Freq: Every day | ORAL | 0 refills | Status: DC | PRN

## 2018-08-10 MED ORDER — OSELTAMIVIR PHOSPHATE 75 MG PO CAPS: 75.0000 mg | ORAL_CAPSULE | Freq: Two times a day (BID) | ORAL | 0 refills | Status: DC

## 2018-08-10 MED ORDER — PSEUDOEPH-BROMPHEN-DM 30-2-10 MG/5ML PO SYRP: 5.0000 mL | ORAL_SOLUTION | Freq: Four times a day (QID) | ORAL | 0 refills | Status: DC | PRN

## 2018-08-10 NOTE — ED Provider Notes (Signed)
[facility] Emergency Department Provider Note  ____________________________________________  Time seen: Approximately 12:01 PM  I have reviewed the triage vital signs and the nursing notes.   HISTORY  Chief Complaint Cough    HPI Kimberly [name] is a 58 y.o. female that presents to the emergency department for evaluation of nasal congestion, sore throat, nonproductive cough for 2 days.  Patient states that she had several appointments with doctors last week and thinks she picked up something in the office.  She is scheduled to have an endoscopy tomorrow because she has had some difficulty swallowing.  She checked her temperature yesterday and it was 98.  She does not smoke.  No shortness of breath, chest pain, vomiting, abdominal pain, diarrhea.  Past Medical History:  Diagnosis Date  . Anxiety    takes alprazolam - rare use   . Arthritis    cervical spondylosis   . Asthma    SEES  PULM W/ [place]    . Balance problem   . Brain cyst   . Depression   . Difficult intubation    Small mouth opening, limited neck flexion, very anterior   . GERD (gastroesophageal reflux disease)    pt. reports that its better, no meds in use at this time- 2015  . Headache   . Herpes   . History of kidney stones   . History of pneumonia   . Hypertension    pt. doesn't see cardiologist, followed for HTN by Dr. Gerarda Fraction  . Inflammation of shoulder joint   . Memory difficulties   . Pneumothorax on right    following C4-6 ACDF 07/04/16  . PONV (postoperative nausea and vomiting)    WITH SURGERY 07/04/16 (NECK) RT LUNG COLLAPSED  . Recurrent falls   . Shortness of breath dyspnea    WITH EXERTION   . Urinary frequency     Patient Active Problem List   Diagnosis Date Noted  . Pelvic pain in female 05/29/2018  . Chronic pain syndrome 04/17/2018  . Cervicalgia 04/17/2018  . Controlled substance agreement signed 04/17/2018  . Cervical fusion syndrome 03/24/2018  . Hx of  cervical spine surgery 03/24/2018  . Depression, major, recurrent, mild ([place]) 11/21/2017  . Poorly-controlled hypertension 11/21/2017  . Depression, major, single episode, complete remission ([place]) 10/24/2017  . Degenerative disc disease, cervical 08/28/2017  . Vasovagal syncope 08/28/2017  . Dizziness 08/22/2017  . History of recent fall 08/22/2017  . Hypothyroidism (acquired) 08/13/2017  . Loss of memory 04/30/2017  . Reaction to severe stress 02/14/2017  . Pseudoarthrosis of cervical spine ([place]) 11/13/2016  . Asthma 07/05/2016  . Acute chest wall pain 07/05/2016  . GERD (gastroesophageal reflux disease) 07/05/2016  . Spontaneous pneumothorax   . Medication overuse headache 04/19/2016  . Chronic tension-type headache, not intractable 04/19/2016  . Numbness and tingling 04/19/2016  . Dysphagia 03/07/2016  . Vitamin D deficiency 02/07/2016  . Bilateral hand pain 02/06/2016  . Cervical stenosis of spinal canal 08/06/2014  . Muscle weakness (generalized) 11/12/2013  . Tight fascia 11/12/2013  . Decreased range of motion of shoulder 11/12/2013  . Cervical spondylosis without myelopathy 10/29/2013  . S/P carpal tunnel release 02/19/2013  . CTS (carpal tunnel syndrome) 02/19/2013  . SHOULDER PAIN 10/06/2007  . IMPINGEMENT SYNDROME 10/06/2007  . RUPTURE ROTATOR CUFF 10/06/2007  . HTN (hypertension) 10/03/2007    Past Surgical History:  Procedure Laterality Date  . ABDOMINAL HYSTERECTOMY    . ANTERIOR CERVICAL DECOMP/DISCECTOMY FUSION N/A 08/06/2014   Procedure:  ANTERIOR CERVICAL DECOMPRESSION/DISCECTOMY FUSION CERVICAL THREE-FOUR,CERVICAL SIX-SEVEN ,CERVICAL SEVEN-THORACIC ONE;  Surgeon: Floyce Stakes, MD;  Location: [place];  Service: Neurosurgery;  Laterality: N/A;  . ANTERIOR CERVICAL DECOMP/DISCECTOMY FUSION N/A 07/04/2016   Procedure: CERVICAL FOUR-FIVE, CERVICAL FIVE-SIX ANTERIOR CERVICAL DECOMPRESSION/DISCECTOMY/FUSION WITH REVISION OF CERVICAL THREE-FOUR PLATE;  Surgeon:  Leeroy Cha, MD;  Location: [place];  Service: Neurosurgery;  Laterality: N/A;  . CARPAL TUNNEL RELEASE Left 02/16/2013   Procedure: CARPAL TUNNEL RELEASE;  Surgeon: Carole Civil, MD;  Location: [facility];  Service: Orthopedics;  Laterality: Left;  . CARPAL TUNNEL RELEASE Right 03/27/2013   Procedure: RIGHT CARPAL TUNNEL RELEASE;  Surgeon: Carole Civil, MD;  Location: [facility];  Service: Orthopedics;  Laterality: Right;  . CESAREAN SECTION     x2  . CHOLECYSTECTOMY N/A 06/17/2015   Procedure: LAPAROSCOPIC CHOLECYSTECTOMY;  Surgeon: Aviva Signs, MD;  Location: [facility];  Service: General;  Laterality: N/A;  . COLONOSCOPY    . FOREIGN BODY REMOVAL Left    knee-as child  . NASAL SINUS SURGERY N/A 04/13/2015   Procedure: nasal endoscopy with adenoid biopsy;  Surgeon: Ruby Cola, MD;  Location: [place];  Service: ENT;  Laterality: N/A;  . NECK SURGERY  2016  . POSTERIOR CERVICAL FUSION/FORAMINOTOMY N/A 11/13/2016   Procedure: CERVICAL TWO-CERVICAL SIX POSTERIOR CERVICAL FUSION WITH LATERAL MASS FIXATION;  Surgeon: Kristeen Miss, MD;  Location: [place];  Service: Neurosurgery;  Laterality: N/A;  posterior approach  . ROTATOR CUFF REPAIR Left   . SHOULDER ACROMIOPLASTY Left 05/18/2015   Procedure: SHOULDER ACROMIOPLASTY;  Surgeon: Earlie Server, MD;  Location: [place];  Service: Orthopedics;  Laterality: Left;  . SHOULDER ARTHROSCOPY WITH DISTAL CLAVICLE RESECTION Left 05/18/2015   Procedure: LEFT SHOULDER ARTHROSCOPY WITH  DISTAL CLAVICLE RESECTION;  Surgeon: Earlie Server, MD;  Location: [place];  Service: Orthopedics;  Laterality: Left;    Prior to Admission medications   Medication Sig Start Date End Date Taking? Authorizing Provider  acetaminophen (TYLENOL) 500 MG tablet Take 500 mg by mouth 2 (two) times daily as needed for headache.    [provider]  acyclovir (ZOVIRAX) 400 MG tablet Take 400 mg by mouth 2 (two) times daily.     [provider]  albuterol (PROVENTIL HFA;VENTOLIN HFA) 108 (90 Base) MCG/ACT inhaler Inhale 2 puffs into the lungs every 4 (four) hours as needed for wheezing or shortness of breath. 10/03/16   Collene Gobble, MD  ALPRAZolam Duanne Moron) 0.5 MG tablet Take 0.5 mg by mouth 3 (three) times daily as needed for anxiety.    [provider]  amLODipine (NORVASC) 5 MG tablet Take 5 mg by mouth daily.     [provider]  Baclofen 5 MG TABS Take 10 mg by mouth 3 (three) times daily as needed. Patient taking differently: Take 10 mg by mouth 3 (three) times daily as needed (spasms).  02/04/17   Nat Christen, MD  benzonatate (TESSALON PERLES) 100 MG capsule Take 1 capsule (100 mg total) by mouth 3 (three) times daily as needed for cough. 08/10/18 08/10/19  Laban Emperor, PA-C  cephALEXin (KEFLEX) 500 MG capsule Take 1 capsule (500 mg total) by mouth 3 (three) times daily. 08/02/18   Fisher, Linden Dolin, PA-C  Cholecalciferol (VITAMIN D-1000 MAX ST) 1000 units tablet Take 1,000 Units by mouth daily.    [provider]  clotrimazole (LOTRIMIN) 1 % cream Apply 1 application topically daily as needed (itching).     [provider]  cyclobenzaprine (FLEXERIL) 10 MG tablet Take 1 tablet by mouth as needed for muscle spasms.     [provider]  diazepam (VALIUM) 10 MG tablet Take 10 mg by mouth daily as needed for anxiety.    [provider]  DULoxetine (CYMBALTA) 30 MG capsule Take 1 capsule (30 mg total) by mouth daily. 05/29/18 07/28/18  Vevelyn Francois, NP  escitalopram (LEXAPRO) 10 MG tablet Take 10 mg by mouth daily as needed (anxiety).     [provider]  estradiol (ESTRACE) 1 MG tablet Take 1 mg by mouth daily.     [provider]  gabapentin (NEURONTIN) 100 MG capsule Take 200 mg by mouth every 8 (eight) hours. 01/04/15   [provider]  liothyronine (CYTOMEL) 5 MCG tablet Take 1 tablet by mouth daily. 03/11/18   [provider]   nortriptyline (PAMELOR) 10 MG capsule Take 1 capsule by mouth at bedtime as needed for sleep.  08/07/16   [provider]  nystatin (MYCOSTATIN) 100000 UNIT/ML suspension Take 100,000 Units by mouth daily. Swish and spit 03/13/18   [provider]  ondansetron (ZOFRAN) 4 MG tablet Take 1 tablet (4 mg total) by mouth daily as needed for nausea or vomiting. 08/10/18 08/10/19  Laban Emperor, PA-C  oseltamivir (TAMIFLU) 75 MG capsule Take 1 capsule (75 mg total) by mouth 2 (two) times daily for 5 days. 08/10/18 08/15/18  Laban Emperor, PA-C  oxybutynin (DITROPAN-XL) 5 MG 24 hr tablet Take 5 mg by mouth 2 (two) times daily.     [provider]  oxyCODONE-acetaminophen (PERCOCET) 10-325 MG tablet Take 1 tablet by mouth 2 (two) times daily as needed for pain. 07/14/18 08/13/18  Vevelyn Francois, NP  pantoprazole (PROTONIX) 40 MG tablet Take 40 mg by mouth daily.    [provider]  predniSONE (DELTASONE) 10 MG tablet 40mg  x 2 days, 30mg  x 2days, 20mg  x 2days, 10mg  x 2days 08/06/18   [provider]  Tetrahydrozoline HCl (VISINE OP) Apply 1 drop to eye daily as needed (irritation).    [provider]    Allergies Shellfish allergy; Peanut-containing drug products; Codeine; Diphtheria toxoid; and Tetanus toxoids  Family History  Problem Relation Age of Onset  . Hypertension Father   . Hypertension Other     Social History Social History   Tobacco Use  . Smoking status: Never Smoker  . Smokeless tobacco: Never Used  Substance Use Topics  . Alcohol use: No  . Drug use: No     Review of Systems  Constitutional: No fever/chills Eyes: No visual changes. No discharge. ENT: Positive for congestion and rhinorrhea. Cardiovascular: No chest pain. Respiratory: Positive for cough. No SOB. Gastrointestinal: No abdominal pain.  No vomiting.  Positive for nausea. Musculoskeletal: Negative for musculoskeletal pain. Skin: Negative for rash, abrasions,  lacerations, ecchymosis. Neurological: Negative for headaches.   ____________________________________________   PHYSICAL EXAM:  VITAL SIGNS: ED Triage Vitals  Enc Vitals Group     BP 08/10/18 1039 (!) 136/98     Pulse Rate 08/10/18 1039 (!) 102     Resp 08/10/18 1039 18     Temp 08/10/18 1039 98.5 F (36.9 C)     Temp Source 08/10/18 1039 Oral     SpO2 08/10/18 1039 95 %     Weight 08/10/18 1040 149 lb (67.6 kg)     Height 08/10/18 1040 5' (1.524 m)     Head Circumference --      Peak  Flow --      Pain Score 08/10/18 1040 6     Pain Loc --      Pain Edu? --      Excl. in [place]? --      Constitutional: Alert and oriented. Well appearing and in no acute distress. Eyes: Conjunctivae are normal. PERRL. EOMI. No discharge. Head: Atraumatic. ENT: No frontal and maxillary sinus tenderness.      Ears: Tympanic membranes pearly gray with good landmarks. No discharge.      Nose: Mild congestion/rhinnorhea.      Mouth/Throat: Mucous membranes are moist. Oropharynx non-erythematous. Tonsils not enlarged. No exudates. Uvula midline. Neck: No stridor.   Hematological/Lymphatic/Immunilogical: No cervical lymphadenopathy. Cardiovascular: Normal rate, regular rhythm.  Good peripheral circulation. Respiratory: Normal respiratory effort without tachypnea or retractions. Lungs CTAB. Good air entry to the bases with no decreased or absent breath sounds. Gastrointestinal: Bowel sounds 4 quadrants. Soft and nontender to palpation. No guarding or rigidity. No palpable masses. No distention. Musculoskeletal: Full range of motion to all extremities. No gross deformities appreciated. Neurologic:  Normal speech and language. No gross focal neurologic deficits are appreciated.  Skin:  Skin is warm, dry and intact. No rash noted. Psychiatric: Mood and affect are normal. Speech and behavior are normal. Patient exhibits appropriate insight and judgement.   ____________________________________________    LABS (all labs ordered are listed, but only abnormal results are displayed)  Labs Reviewed  INFLUENZA PANEL BY PCR (TYPE A & B) - Abnormal; Notable for the following components:      Result Value   Influenza B By PCR POSITIVE (*)    All other components within normal limits  GROUP A STREP BY PCR   ____________________________________________  EKG   ____________________________________________  RADIOLOGY Robinette Haines, personally viewed and evaluated these images (plain radiographs) as part of my medical decision making, as well as reviewing the written report by the radiologist.  Dg Chest 2 View  Result Date: 08/10/2018 CLINICAL DATA:  Cough and shortness of breath for 2 days. Personal history of asthma and right pneumothorax. EXAM: CHEST - 2 VIEW COMPARISON:  11/27/2017 FINDINGS: The heart size and mediastinal contours are within normal limits. Mild scarring in the right lower lobe is again noted. No evidence of pulmonary infiltrate or edema. No evidence of pneumothorax or pleural effusion. IMPRESSION: No active cardiopulmonary disease. Electronically Signed   By: Earle Gell M.D.   On: 08/10/2018 13:36    ____________________________________________    PROCEDURES  Procedure(s) performed:    Procedures    Medications  ipratropium-albuterol (DUONEB) 0.5-2.5 (3) MG/3ML nebulizer solution 3 mL (3 mLs Nebulization Given 08/10/18 1222)     ____________________________________________   INITIAL IMPRESSION / ASSESSMENT AND PLAN / ED COURSE  Pertinent labs & imaging results that were available during my care of the patient were reviewed by me and considered in my medical decision making (see chart for details).  Review of the [place] CSRS was performed in accordance of the [place] prior to dispensing any controlled drugs.    Patient's diagnosis is consistent with influenza B. Vital signs and exam are reassuring.  Chest x-ray negative for acute cardiopulmonary processes.   Strep throat test is negative.  Influenza B test is positive.  Patient appears well and is staying well hydrated. Patient should alternate tylenol and ibuprofen for fever. Patient feels comfortable going home. Patient will be discharged home with prescriptions for Tamiflu, Tessalon Perles, Zofran. Patient is to follow up with primary care as needed  or otherwise directed.  Patient will call primary care tomorrow to reschedule her appointments this week.  Patient is given ED precautions to return to the ED for any worsening or new symptoms.     ____________________________________________  FINAL CLINICAL IMPRESSION(S) / ED DIAGNOSES  Final diagnoses:  Influenza B     This chart was dictated using voice recognition software/Dragon. Despite best efforts to proofread, errors can occur which can change the meaning. Any change was purely unintentional.    Laban Emperor, PA-C 08/10/18 1437    Eula Listen, MD 08/10/18 [phone]

## 2018-08-10 NOTE — ED Triage Notes (Signed)
Cough, congestion, sore throat x 2 days 

## 2018-08-10 NOTE — Discharge Instructions (Addendum)
Your influenza B test is positive.  Please drink plenty of fluids.  You can alternate Tylenol and Motrin for fevers.  I have prescribed you Tamiflu, which does not cure the flu but can help shorten the length of the flu.  Zofran will help with any nausea.  Tessalon Perles will help with your cough.  You can get some Cepacol throat spray over-the-counter to help with your sore throat.

## 2018-08-11 ENCOUNTER — Other Ambulatory Visit: Payer: Self-pay | Admitting: Radiology

## 2018-08-11 ENCOUNTER — Telehealth: Payer: Self-pay | Admitting: Urology

## 2018-08-11 ENCOUNTER — Encounter: Payer: 59 | Attending: Gastroenterology

## 2018-08-11 DIAGNOSIS — N200 Calculus of kidney: Secondary | ICD-10-CM

## 2018-08-11 LAB — CULTURE, URINE COMPREHENSIVE

## 2018-08-11 NOTE — Telephone Encounter (Signed)
Pt LMOM that she was in the ER over the weekend and has the flu.  She wanted to make [place] aware in case she is not better for her surgery on 08/28/2018.

## 2018-08-21 ENCOUNTER — Other Ambulatory Visit: Payer: 59 | Admitting: Urology

## 2018-08-25 ENCOUNTER — Encounter: Payer: 59 | Admitting: Nurse Practitioner

## 2018-08-28 ENCOUNTER — Ambulatory Visit: Payer: 59 | Admitting: Urology

## 2018-08-28 ENCOUNTER — Other Ambulatory Visit: Payer: 59 | Admitting: Urology

## 2018-09-02 ENCOUNTER — Encounter: Payer: Self-pay | Admitting: *Deleted

## 2018-09-03 MED ORDER — CIPROFLOXACIN HCL 500 MG PO TABS
500.0000 mg | ORAL_TABLET | ORAL | Status: AC
  Administered 2018-09-04: 500 mg via ORAL

## 2018-09-04 ENCOUNTER — Ambulatory Visit: Payer: Medicare Other

## 2018-09-04 ENCOUNTER — Encounter
Admission: RE | Disposition: A | Discharge status: Home / Self Care | Payer: Self-pay | Source: Home / Self Care | Attending: Urology

## 2018-09-04 ENCOUNTER — Ambulatory Visit
Admission: RE | Admit: 2018-09-04 | Discharge: 2018-09-04 | Disposition: A | Discharge status: Home / Self Care | Payer: Medicare Other | Attending: Urology | Admitting: Urology

## 2018-09-04 ENCOUNTER — Encounter: Payer: Self-pay | Admitting: *Deleted

## 2018-09-04 ENCOUNTER — Other Ambulatory Visit: Payer: Self-pay

## 2018-09-04 DIAGNOSIS — F329 Major depressive disorder, single episode, unspecified: Secondary | ICD-10-CM | POA: Diagnosis not present

## 2018-09-04 DIAGNOSIS — Z885 Allergy status to narcotic agent status: Secondary | ICD-10-CM | POA: Insufficient documentation

## 2018-09-04 DIAGNOSIS — Z888 Allergy status to other drugs, medicaments and biological substances status: Secondary | ICD-10-CM | POA: Insufficient documentation

## 2018-09-04 DIAGNOSIS — F419 Anxiety disorder, unspecified: Secondary | ICD-10-CM | POA: Diagnosis not present

## 2018-09-04 DIAGNOSIS — Z87442 Personal history of urinary calculi: Secondary | ICD-10-CM | POA: Insufficient documentation

## 2018-09-04 DIAGNOSIS — N200 Calculus of kidney: Secondary | ICD-10-CM | POA: Insufficient documentation

## 2018-09-04 DIAGNOSIS — J45909 Unspecified asthma, uncomplicated: Secondary | ICD-10-CM | POA: Diagnosis not present

## 2018-09-04 DIAGNOSIS — I10 Essential (primary) hypertension: Secondary | ICD-10-CM | POA: Diagnosis not present

## 2018-09-04 HISTORY — DX: Myoneural disorder, unspecified: G70.9

## 2018-09-04 HISTORY — DX: Influenza due to unidentified influenza virus with other respiratory manifestations: J11.1

## 2018-09-04 HISTORY — PX: EXTRACORPOREAL SHOCK WAVE LITHOTRIPSY: SHX1557

## 2018-09-04 SURGERY — LITHOTRIPSY, ESWL
Anesthesia: Moderate Sedation | Laterality: Left

## 2018-09-04 MED ORDER — TAMSULOSIN HCL 0.4 MG PO CAPS: 0.4000 mg | ORAL_CAPSULE | Freq: Every day | ORAL | 0 refills | Status: DC

## 2018-09-04 MED ORDER — SODIUM CHLORIDE 0.9 % IV SOLN
INTRAVENOUS | Status: DC
  Administered 2018-09-04: 07:00:00 via INTRAVENOUS

## 2018-09-04 MED ORDER — DIAZEPAM 5 MG PO TABS
ORAL_TABLET | ORAL | Status: AC
  Filled 2018-09-04: qty 1

## 2018-09-04 MED ORDER — ONDANSETRON HCL 4 MG/2ML IJ SOLN
4.0000 mg | Freq: Once | INTRAMUSCULAR | Status: AC
  Administered 2018-09-04: 4 mg via INTRAVENOUS

## 2018-09-04 MED ORDER — DIPHENHYDRAMINE HCL 25 MG PO CAPS
25.0000 mg | ORAL_CAPSULE | ORAL | Status: AC
  Administered 2018-09-04: 25 mg via ORAL

## 2018-09-04 MED ORDER — DIAZEPAM 5 MG PO TABS
5.0000 mg | ORAL_TABLET | ORAL | Status: AC
  Administered 2018-09-04: 5 mg via ORAL

## 2018-09-04 MED ORDER — ONDANSETRON HCL 2 MG/ML IV SOLN: 4.0000 mg | Freq: Once | INTRAVENOUS | Status: DC

## 2018-09-04 MED ORDER — CIPROFLOXACIN HCL 500 MG PO TABS
ORAL_TABLET | ORAL | Status: AC
  Filled 2018-09-04: qty 1

## 2018-09-04 MED ORDER — DIPHENHYDRAMINE HCL 25 MG PO CAPS
ORAL_CAPSULE | ORAL | Status: AC
  Filled 2018-09-04: qty 1

## 2018-09-04 MED ORDER — ONDANSETRON HCL 4 MG/2ML IJ SOLN
INTRAMUSCULAR | Status: AC
  Filled 2018-09-04: qty 2

## 2018-09-04 NOTE — H&P (Signed)
UROLOGY H&P UPDATE  Agree with prior H&P dated 08/07/2017  Cardiac: RRR Lungs: CTA bilaterally  Laterality: Left Procedure: Left SWL  Left lower pole 71mm non-obstructive stone, 850HU, 9cm SSD  Urine: Urine cx 08/07/18 no growth  Informed consent obtained, we specifically discussed the risks of bleeding, infection, post-operative pain, need for additional procedures, and steinstrasse.   Billey Co, MD 09/04/2018

## 2018-09-04 NOTE — Discharge Instructions (Signed)
FOLLOW INSTRUCTIONS PROVIDED BY OFFICE.

## 2018-09-05 ENCOUNTER — Encounter: Payer: Self-pay | Admitting: Urology

## 2018-09-06 IMAGING — CT CT NECK W/ CM
2 of 3 series · 7 of 14 positions shown, 8 images · IV contrast (iopamidol)
Comparison: Cervical spine CT myelogram 02/17/2014

CLINICAL DATA: Neck pain. Right anterior neck boil for 4 weeks.
Pain and dysphagia.

EXAM:
CT NECK WITH CONTRAST
TECHNIQUE: Multidetector CT imaging of the neck was performed using the
standard protocol following the bolus administration of intravenous
contrast.
CONTRAST:  75mL [id number] IOPAMIDOL ([id number]) INJECTION 61%

[Series 2: axial neck · axial · 0.50mm/px · z∈[-211,-107]mm · 3 of 105 slices shown]
[im 27/105  bone]
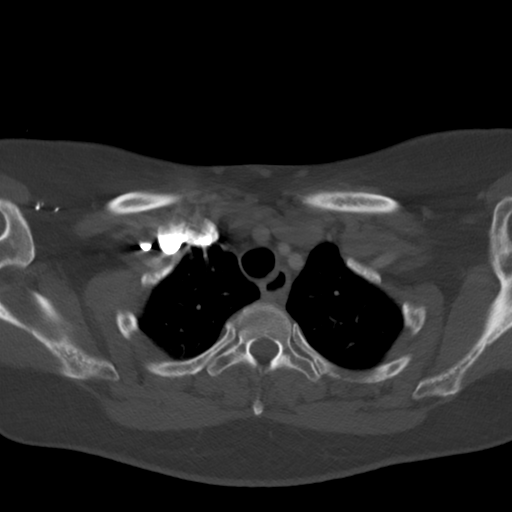
[im 53/105  bone]
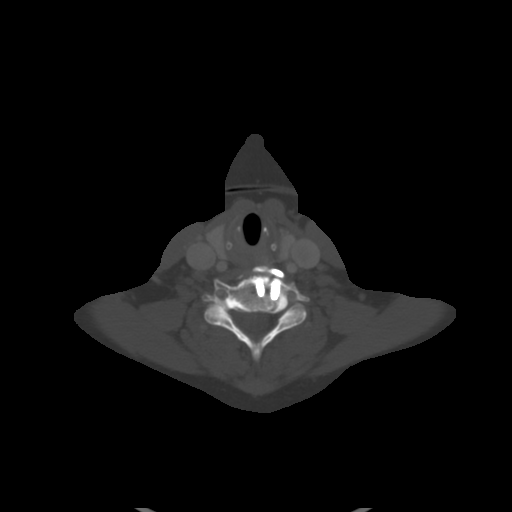
[im 79/105  bone]
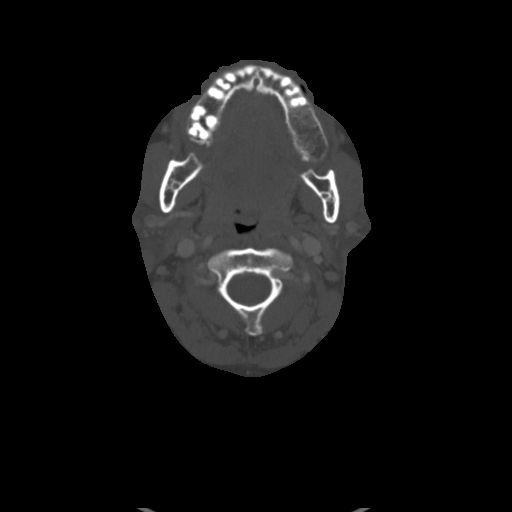

[Series 7: orthogonal ax · axial · 0.46mm/px · z∈[-245,-108]mm · 4 of 116 slices shown, 5 images]
[im 24/116  soft-tissue]
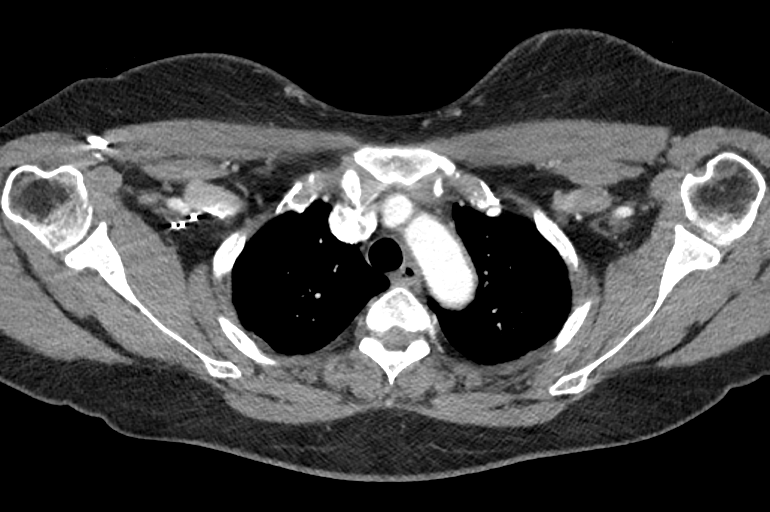
[im 24/116  bone]
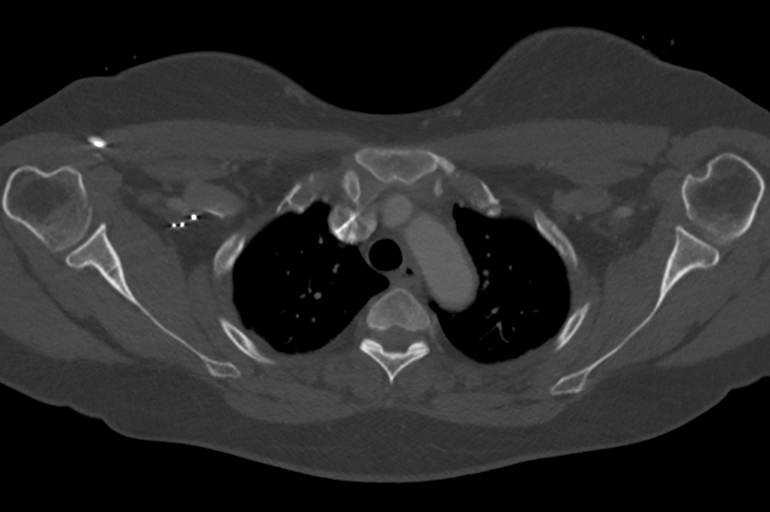
[im 47/116  bone]
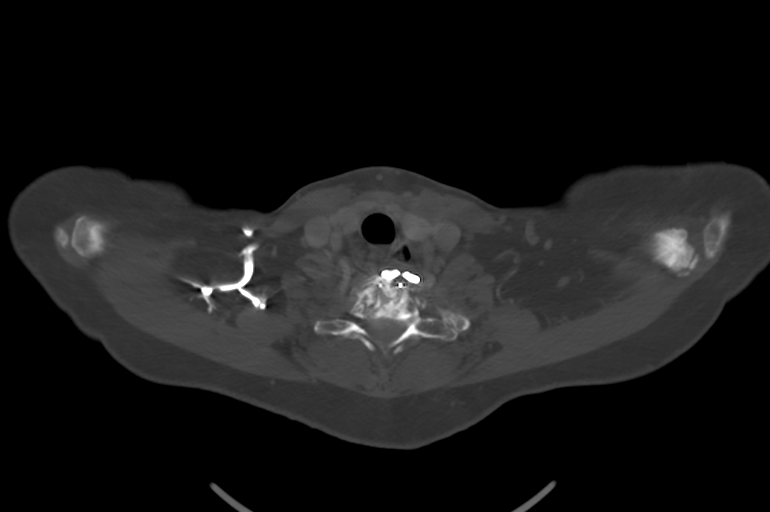
[im 70/116  bone]
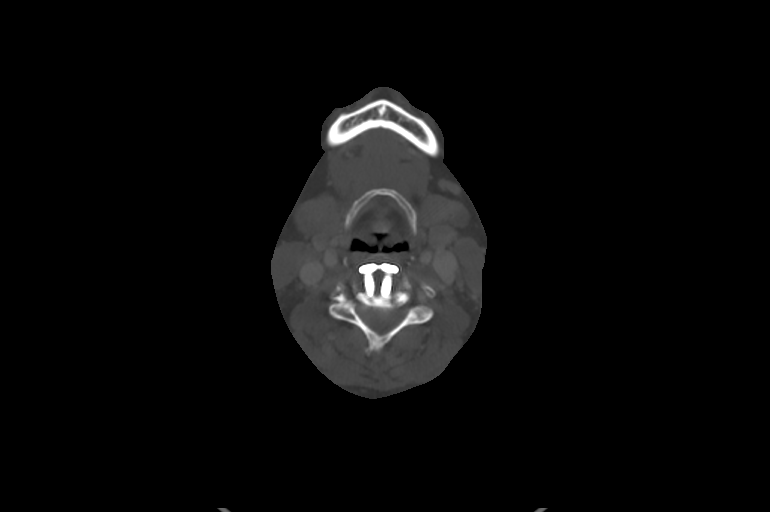
[im 93/116  bone]
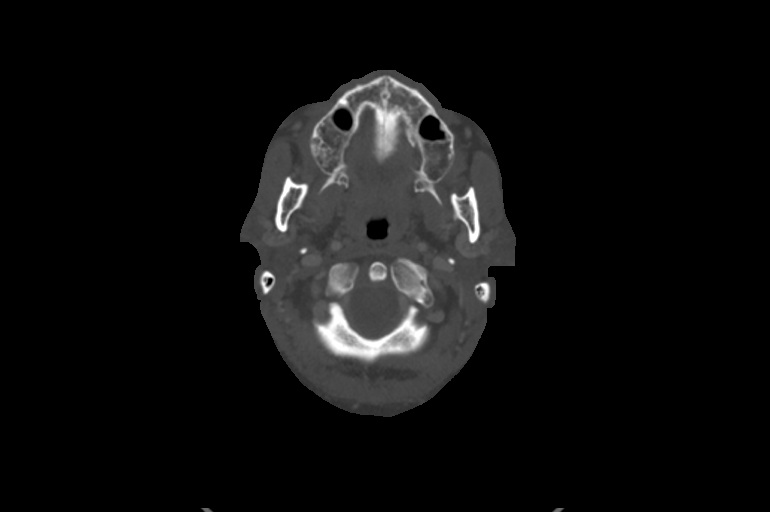

[7 of 14 positions shown; findings below may reference images not displayed]

FINDINGS: Pharynx and larynx: No pharyngeal mass or inflammatory change
identified. Unremarkable larynx.

Salivary glands: Submandibular and parotid glands are unremarkable.

Thyroid: Unremarkable.

Lymph nodes: 1.1 cm short axis right level IIA lymph node, larger
than lymph nodes on the contralateral side though most likely
benign/ reactive.

Vascular: Major vascular structures of the neck appear patent.

Limited intracranial: Unremarkable.

Visualized orbits: Unremarkable.

Mastoids and visualized paranasal sinuses: Clear.

Skeleton: Prior C2-3 ACDF without evidence of solid osseous fusion.
Prior C5-C7 ACDF with evidence of solid fusion at both levels.

Upper chest: Unremarkable.

Other: Corresponding to the area of clinical concern in the anterior
right neck and a focal area of cutaneous/subcutaneous soft tissue
thickening which measures up to 4 mm in thickness. No significant
surrounding inflammatory change or fluid collection is seen.
IMPRESSION: Small area of focal skin thickening in the right anterior neck. No
fluid collection.

## 2018-09-08 ENCOUNTER — Encounter: Payer: Self-pay | Admitting: Nurse Practitioner

## 2018-09-08 ENCOUNTER — Ambulatory Visit: Payer: Medicare Other | Attending: Nurse Practitioner | Admitting: Nurse Practitioner

## 2018-09-08 ENCOUNTER — Other Ambulatory Visit: Payer: Self-pay

## 2018-09-08 VITALS — BP 131/90 | HR 89 | Temp 97.6°F | Ht 61.0 in | Wt 149.0 lb

## 2018-09-08 DIAGNOSIS — M542 Cervicalgia: Secondary | ICD-10-CM | POA: Insufficient documentation

## 2018-09-08 DIAGNOSIS — G894 Chronic pain syndrome: Secondary | ICD-10-CM | POA: Insufficient documentation

## 2018-09-08 DIAGNOSIS — M5030 Other cervical disc degeneration, unspecified cervical region: Secondary | ICD-10-CM | POA: Diagnosis not present

## 2018-09-08 DIAGNOSIS — M47812 Spondylosis without myelopathy or radiculopathy, cervical region: Secondary | ICD-10-CM | POA: Diagnosis not present

## 2018-09-08 MED ORDER — OXYCODONE-ACETAMINOPHEN 10-325 MG PO TABS: 1.0000 | ORAL_TABLET | Freq: Two times a day (BID) | ORAL | 0 refills | Status: AC | PRN

## 2018-09-08 MED ORDER — OXYCODONE-ACETAMINOPHEN 10-325 MG PO TABS: 1.0000 | ORAL_TABLET | Freq: Two times a day (BID) | ORAL | 0 refills | Status: DC | PRN

## 2018-09-08 NOTE — Patient Instructions (Signed)
____________________________________________________________________________________________  Medication Rules  Purpose: To inform patients, and their family members, of our rules and regulations.  Applies to: All patients receiving prescriptions (written or electronic).  Pharmacy of record: Pharmacy where electronic prescriptions will be sent. If written prescriptions are taken to a different pharmacy, please inform the nursing staff. The pharmacy listed in the electronic medical record should be the one where you would like electronic prescriptions to be sent.  Electronic prescriptions: In compliance with the  Strengthen Opioid Misuse Prevention (STOP) Act of 2017 (Session Law 2017-74/H243), effective July 30, 2018, all controlled substances must be electronically prescribed. Calling prescriptions to the pharmacy will cease to exist.  Prescription refills: Only during scheduled appointments. Applies to all prescriptions.  NOTE: The following applies primarily to controlled substances (Opioid* Pain Medications).   Patient's responsibilities: 1. Pain Pills: Bring all pain pills to every appointment (except for procedure appointments). 2. Pill Bottles: Bring pills in original pharmacy bottle. Always bring the newest bottle. Bring bottle, even if empty. 3. Medication refills: You are responsible for knowing and keeping track of what medications you take and those you need refilled. The day before your appointment: write a list of all prescriptions that need to be refilled. The day of the appointment: give the list to the admitting nurse. Prescriptions will be written only during appointments. No prescriptions will be written on procedure days. If you forget a medication: it will not be "Called in", "Faxed", or "electronically sent". You will need to get another appointment to get these prescribed. No early refills. Do not call asking to have your prescription filled  early. 4. Prescription Accuracy: You are responsible for carefully inspecting your prescriptions before leaving our office. Have the discharge nurse carefully go over each prescription with you, before taking them home. Make sure that your name is accurately spelled, that your address is correct. Check the name and dose of your medication to make sure it is accurate. Check the number of pills, and the written instructions to make sure they are clear and accurate. Make sure that you are given enough medication to last until your next medication refill appointment. 5. Taking Medication: Take medication as prescribed. When it comes to controlled substances, taking less pills or less frequently than prescribed is permitted and encouraged. Never take more pills than instructed. Never take medication more frequently than prescribed.  6. Inform other Doctors: Always inform, all of your healthcare providers, of all the medications you take. 7. Pain Medication from other Providers: You are not allowed to accept any additional pain medication from any other Doctor or Healthcare provider. There are two exceptions to this rule. (see below) In the event that you require additional pain medication, you are responsible for notifying us, as stated below. 8. Medication Agreement: You are responsible for carefully reading and following our Medication Agreement. This must be signed before receiving any prescriptions from our practice. Safely store a copy of your signed Agreement. Violations to the Agreement will result in no further prescriptions. (Additional copies of our Medication Agreement are available upon request.) 9. Laws, Rules, & Regulations: All patients are expected to follow all Federal and State Laws, Statutes, Rules, & Regulations. Ignorance of the Laws does not constitute a valid excuse. The use of any illegal substances is prohibited. 10. Adopted CDC guidelines & recommendations: Target dosing levels will be  at or below 60 MME/day. Use of benzodiazepines** is not recommended.  Exceptions: There are only two exceptions to the rule of not   receiving pain medications from other Healthcare Providers. 1. Exception #1 (Emergencies): In the event of an emergency (i.e.: accident requiring emergency care), you are allowed to receive additional pain medication. However, you are responsible for: As soon as you are able, call our office [phone], at any time of the day or night, and leave a message stating your name, the date and nature of the emergency, and the name and dose of the medication prescribed. In the event that your call is answered by a member of our staff, make sure to document and save the date, time, and the name of the person that took your information.  2. Exception #2 (Planned Surgery): In the event that you are scheduled by another doctor or dentist to have any type of surgery or procedure, you are allowed (for a period no longer than 30 days), to receive additional pain medication, for the acute post-op pain. However, in this case, you are responsible for picking up a copy of our "Post-op Pain Management for Surgeons" handout, and giving it to your surgeon or dentist. This document is available at our office, and does not require an appointment to obtain it. Simply go to our office during business hours (Monday-Thursday from 8:00 AM to 4:00 PM) (Friday 8:00 AM to 12:00 Noon) or if you have a scheduled appointment with us, prior to your surgery, and ask for it by name. In addition, you will need to provide us with your name, name of your surgeon, type of surgery, and date of procedure or surgery.  *Opioid medications include: morphine, codeine, oxycodone, oxymorphone, hydrocodone, hydromorphone, meperidine, tramadol, tapentadol, buprenorphine, fentanyl, methadone. **Benzodiazepine medications include: diazepam (Valium), alprazolam (Xanax), clonazepam (Klonopine), lorazepam (Ativan), clorazepate  (Tranxene), chlordiazepoxide (Librium), estazolam (Prosom), oxazepam (Serax), temazepam (Restoril), triazolam (Halcion) (Last updated: 09/26/2017) ____________________________________________________________________________________________   ____________________________________________________________________________________________  Appointment Policy Summary  It is our goal and responsibility to provide the medical community with assistance in the evaluation and management of patients with chronic pain. Unfortunately our resources are limited. Because we do not have an unlimited amount of time, or available appointments, we are required to closely monitor and manage their use. The following rules exist to maximize their use:  Patient's responsibilities: 1. Punctuality:  At what time should I arrive? You should be physically present in our office 30 minutes before your scheduled appointment. Your scheduled appointment is with your assigned healthcare provider. However, it takes 5-10 minutes to be "checked-in", and another 15 minutes for the nurses to do the admission. If you arrive to our office at the time you were given for your appointment, you will end up being at least 20-25 minutes late to your appointment with the provider. 2. Tardiness:  What happens if I arrive only a few minutes after my scheduled appointment time? You will need to reschedule your appointment. The cutoff is your appointment time. This is why it is so important that you arrive at least 30 minutes before that appointment. If you have an appointment scheduled for 10:00 AM and you arrive at 10:01, you will be required to reschedule your appointment.  3. Plan ahead:  Always assume that you will encounter traffic on your way in. Plan for it. If you are dependent on a driver, make sure they understand these rules and the need to arrive early. 4. Other appointments and responsibilities:  Avoid scheduling any other appointments  before or after your pain clinic appointments.  5. Be prepared:  Write down everything that you need to discuss   with your healthcare provider and give this information to the admitting nurse. Write down the medications that you will need refilled. Bring your pills and bottles (even the empty ones), to all of your appointments, except for those where a procedure is scheduled. 6. No children or pets:  Find someone to take care of them. It is not appropriate to bring them in. 7. Scheduling changes:  We request "advanced notification" of any changes or cancellations. 8. Advanced notification:  Defined as a time period of more than 24 hours prior to the originally scheduled appointment. This allows for the appointment to be offered to other patients. 9. Rescheduling:  When a visit is rescheduled, it will require the cancellation of the original appointment. For this reason they both fall within the category of "Cancellations".  10. Cancellations:  They require advanced notification. Any cancellation less than 24 hours before the  appointment will be recorded as a "No Show". 11. No Show:  Defined as an unkept appointment where the patient failed to notify or declare to the practice their intention or inability to keep the appointment.  Corrective process for repeat offenders:  1. Tardiness: Three (3) episodes of rescheduling due to late arrivals will be recorded as one (1) "No Show". 2. Cancellation or reschedule: Three (3) cancellations or rescheduling will be recorded as one (1) "No Show". 3. "No Shows": Three (3) "No Shows" within a 12 month period will result in discharge from the practice. ____________________________________________________________________________________________   

## 2018-09-08 NOTE — Progress Notes (Signed)
Nursing Pain Medication Assessment:  Safety precautions to be maintained throughout the outpatient stay will include: orient to surroundings, keep bed in low position, maintain call bell within reach at all times, provide assistance with transfer out of bed and ambulation.  Medication Inspection Compliance: Pill count conducted under aseptic conditions, in front of the patient. Neither the pills nor the bottle was removed from the patient's sight at any time. Once count was completed pills were immediately returned to the patient in their original bottle.  Medication: Oxycodone/APAP Pill/Patch Count: 0 of 60 pills remain Pill/Patch Appearance: Markings consistent with prescribed medication Bottle Appearance: Standard pharmacy container. Clearly labeled. Filled Date: 47 / 25 / 2019 Last Medication intake:  Ran out of medicine more than 48 hours ago

## 2018-09-08 NOTE — Progress Notes (Signed)
Patient's Name: Kimberly [name]  MRN: [id number]  Referring Provider: Glendon Axe, MD  DOB: October 29, 1960  PCP: Glendon Axe, MD  DOS: 09/08/2018  Note by: Vevelyn Francois NP  Service setting: Ambulatory outpatient  Specialty: Interventional Pain Management  Location: [facility] (AMB) Pain Management Facility    Patient type: Established    Primary Reason(s) for Visit: Encounter for prescription drug management. (Level of risk: moderate)  CC: Headache  HPI  Kimberly [name] is a 58 y.o. year old, female patient, who comes today for a medication management evaluation. She has SHOULDER PAIN; IMPINGEMENT SYNDROME; RUPTURE ROTATOR CUFF; HTN (hypertension); S/P carpal tunnel release; CTS (carpal tunnel syndrome); Cervical spondylosis without myelopathy; Muscle weakness (generalized); Tight fascia; Decreased range of motion of shoulder; Cervical stenosis of spinal canal; Asthma; Acute chest wall pain; GERD (gastroesophageal reflux disease); Spontaneous pneumothorax; Pseudoarthrosis of cervical spine ([place]); Bilateral hand pain; Degenerative disc disease, cervical; Dizziness; Dysphagia; History of recent fall; Hypothyroidism (acquired); Loss of memory; Medication overuse headache; Chronic tension-type headache, not intractable; Numbness and tingling; Reaction to severe stress; Vasovagal syncope; Vitamin D deficiency; Cervical fusion syndrome; Hx of cervical spine surgery; Chronic pain syndrome; Cervicalgia; Controlled substance agreement signed; Depression, major, recurrent, mild ([place]); Depression, major, single episode, complete remission ([place]); Pelvic pain in female; Poorly-controlled hypertension; and Recurrent herpes simplex on their problem list. Her primarily concern today is the Headache  Pain Assessment: Location: (back) Head Radiating: Pain radiaties up neck to top of head Onset: More than a month ago Duration: Chronic pain Quality: Aching Severity: 8 /10 (subjective, self-reported pain score)  Note:  Reported level is compatible with observation. Clinically the patient looks like a 2/10 A 2/10 is viewed as "Mild to Moderate" and described as noticeable and distracting. Impossible to hide from other people. More frequent flare-ups. Still possible to adapt and function close to normal. It can be very annoying and may have occasional stronger flare-ups. With discipline, patients may get used to it and adapt. Discrepancy may suggest a "suffering" (psychosocial) component. When using our objective Pain Scale, levels between 6 and 10/10 are said to belong in an emergency room, as it progressively worsens from a 6/10, described as severely limiting, requiring emergency care not usually available at an outpatient pain management facility. At a 6/10 level, communication becomes difficult and requires great effort. Assistance to reach the emergency department may be required. Facial flushing and profuse sweating along with potentially dangerous increases in heart rate and blood pressure will be evident. Effect on ADL: limits my daily activies Timing: Constant Modifying factors: Medications BP: 131/90  HR: 89  [name] was last scheduled for an appointment on 05/29/2018 for medication management. During today's appointment we reviewed Kimberly [name]'s chronic pain status, as well as her outpatient medication regimen. She admits that she knows that her pain is something that she has to deal with. She denies any new concerns today. She denies any side effects of her current regimen.  The patient  reports no history of drug use. Her body mass index is 28.15 kg/m.  Further details on both, my assessment(s), as well as the proposed treatment plan, please see below.  Controlled Substance Pharmacotherapy Assessment REMS (Risk Evaluation and Mitigation Strategy)  Analgesic:Percocet 10 mg BID prn MME/day:66m/day. BChauncey Fischer RN  09/08/2018  8:19 AM  Sign when Signing Visit Nursing Pain Medication  Assessment:  Safety precautions to be maintained throughout the outpatient stay will include: orient to surroundings, keep bed in low position, maintain call bell  within reach at all times, provide assistance with transfer out of bed and ambulation.  Medication Inspection Compliance: Pill count conducted under aseptic conditions, in front of the patient. Neither the pills nor the bottle was removed from the patient's sight at any time. Once count was completed pills were immediately returned to the patient in their original bottle.  Medication: Oxycodone/APAP Pill/Patch Count: 0 of 60 pills remain Pill/Patch Appearance: Markings consistent with prescribed medication Bottle Appearance: Standard pharmacy container. Clearly labeled. Filled Date: 19 / 2 / 2019 Last Medication intake:  Ran out of medicine more than 48 hours ago   Pharmacokinetics: Liberation and absorption (onset of action): WNL Distribution (time to peak effect): WNL Metabolism and excretion (duration of action): WNL         Pharmacodynamics: Desired effects: Analgesia: [name] reports >50% benefit. Functional ability: Patient reports that medication allows her to accomplish basic ADLs Clinically meaningful improvement in function (CMIF): Sustained CMIF goals met Perceived effectiveness: Described as relatively effective, allowing for increase in activities of daily living (ADL) Undesirable effects: Side-effects or Adverse reactions: None reported Monitoring: [place] PMP: Online review of the past 20-monthperiod conducted. Compliant with practice rules and regulations Last UDS on record: Summary  Date Value Ref Range Status  03/24/2018 FINAL  Final    Comment:    ==================================================================== TOXASSURE COMP DRUG ANALYSIS,UR ==================================================================== Test                             Result       Flag       Units Drug Present not Declared  for Prescription Verification   Naproxen                       PRESENT      UNEXPECTED Drug Absent but Declared for Prescription Verification   Diazepam                       Not Detected UNEXPECTED ng/mg creat   Alprazolam                     Not Detected UNEXPECTED ng/mg creat   Oxycodone                      Not Detected UNEXPECTED ng/mg creat   Tramadol                       Not Detected UNEXPECTED ng/mg creat   Gabapentin                     Not Detected UNEXPECTED   Baclofen                       Not Detected UNEXPECTED   Cyclobenzaprine                Not Detected UNEXPECTED   Methocarbamol                  Not Detected UNEXPECTED   Citalopram                     Not Detected UNEXPECTED   Duloxetine                     Not Detected UNEXPECTED   Nortriptyline  Not Detected UNEXPECTED   Acetaminophen                  Not Detected UNEXPECTED    Acetaminophen, as indicated in the declared medication list, is    not always detected even when used as directed.   Ibuprofen                      Not Detected UNEXPECTED    Ibuprofen, as indicated in the declared medication list, is not    always detected even when used as directed. ==================================================================== Test                      Result    Flag   Units      Ref Range   Creatinine              255              mg/dL      >=20 ==================================================================== Declared Medications:  The flagging and interpretation on this report are based on the  following declared medications.  Unexpected results may arise from  inaccuracies in the declared medications.  **Note: The testing scope of this panel includes these medications:  Alprazolam (Xanax)  Baclofen  Cyclobenzaprine (Flexeril)  Diazepam (Valium)  Duloxetine (Cymbalta)  Escitalopram (Lexapro)  Gabapentin (Neurontin)  Methocarbamol (Robaxin)  Nortriptyline (Pamelor)  Oxycodone  Oxycodone  (Percocet)  Tramadol  **Note: The testing scope of this panel does not include small to  moderate amounts of these reported medications:  Acetaminophen (Percocet)  Acetaminophen (Tylenol)  Ibuprofen  Ibuprofen (Advil)  **Note: The testing scope of this panel does not include following  reported medications:  Acyclovir (Zovirax)  Albuterol (Proventil)  Amlodipine (Norvasc)  Cholecalciferol  Clotrimazole (Lotrimin)  Estradiol (Estrace)  LIOTHYRONINE (Cytomel)  Meclizine (Antivert)  Nystatin (Mycostatin)  Oxybutynin (Ditropan)  Pantoprazole (Protonix)  Tetrahydrozoline ==================================================================== For clinical consultation, please call [phone]. ====================================================================    UDS interpretation: Compliant          Medication Assessment Form: Reviewed. Patient indicates being compliant with therapy Treatment compliance: Compliant Risk Assessment Profile: Aberrant behavior: See initial evaluations. None observed or detected today Comorbid factors increasing risk of overdose: See initial evaluation. No additional risks detected today Opioid risk tool (ORT):  Opioid Risk  09/08/2018  Alcohol 0  Illegal Drugs 0  Rx Drugs 0  Alcohol 0  Illegal Drugs 0  Rx Drugs 0  Age between 16-45 years  0  History of Preadolescent Sexual Abuse 0  Psychological Disease 0  ADD -  OCD -  Bipolar -  Depression 0  Opioid Risk Tool Scoring 0  Opioid Risk Interpretation Low Risk    ORT Scoring interpretation table:  Score <3 = Low Risk for SUD  Score between 4-7 = Moderate Risk for SUD  Score >8 = High Risk for Opioid Abuse   Risk of substance use disorder (SUD): Low  Risk Mitigation Strategies:  Patient Counseling: Covered Patient-Prescriber Agreement (PPA): Present and active  Notification to other healthcare providers: Done  Pharmacologic Plan: No change in therapy, at this time.              Laboratory Chemistry  Inflammation Markers (CRP: Acute Phase) (ESR: Chronic Phase) No results found for: CRP, ESRSEDRATE, LATICACIDVEN                       Rheumatology Markers No results found for: RF,  ANA, LABURIC, URICUR, LYMEIGGIGMAB, LYMEABIGMQN, HLAB27                      Renal Function Markers Lab Results  Component Value Date   BUN 11 07/15/2017   CREATININE 0.60 08/04/2018   GFRAA >60 07/15/2017   GFRNONAA >60 07/15/2017                             Hepatic Function Markers Lab Results  Component Value Date   AST 26 07/31/2015   ALT 17 07/31/2015   ALBUMIN 4.1 07/31/2015   ALKPHOS 93 07/31/2015   LIPASE 23 07/15/2017                        Electrolytes Lab Results  Component Value Date   NA 137 07/15/2017   K 4.1 07/15/2017   CL 101 07/15/2017   CALCIUM 9.4 07/15/2017                        Neuropathy Markers Lab Results  Component Value Date   HIV NONREACTIVE 12/18/2013                        CNS Tests No results found for: COLORCSF, APPEARCSF, RBCCOUNTCSF, WBCCSF, POLYSCSF, LYMPHSCSF, EOSCSF, PROTEINCSF, GLUCCSF, JCVIRUS, CSFOLI, IGGCSF                      Bone Pathology Markers No results found for: [place], VD125OH2TOT, [id number], [id number], 25OHVITD1, 25OHVITD2, 25OHVITD3, TESTOFREE, TESTOSTERONE                       Coagulation Parameters Lab Results  Component Value Date   PLT 247 07/15/2017   DDIMER 0.28 07/02/2013                        Cardiovascular Markers Lab Results  Component Value Date   TROPONINI <0.03 07/15/2017   HGB 13.8 07/15/2017   HCT 41.8 07/15/2017                         CA Markers No results found for: CEA, CA125, LABCA2                      Endocrine Markers No results found for: TSH, FREET4, TESTOFREE, TESTOSTERONE, ESTRADIOL, ESTRADIOLPCT, ESTRADIOLFRE                      Note: Lab results reviewed.  Recent Diagnostic Imaging Results  DG Abd 1 View CLINICAL DATA:  Left renal calculus pre  lithotripsy  EXAM: ABDOMEN - 1 VIEW  COMPARISON:  08/07/2018  FINDINGS: 3 x 6 mm left renal calculus overlying the lower pole is unchanged. No other renal calculi  Phleboliths in the pelvis. Normal bowel gas pattern. Surgical clips in the gallbladder fossa.  IMPRESSION: 3 x 6 mm calculus overlying the left lower pole unchanged.  Electronically Signed   By: Franchot Gallo M.D.   On: 09/04/2018 07:53  Complexity Note: Imaging results reviewed. Results shared with Ms. Arcos, using Layman's terms.                         Meds   Current Outpatient Medications:  .  acetaminophen (TYLENOL)  500 MG tablet, Take 500 mg by mouth 2 (two) times daily as needed for headache., Disp: , Rfl:  .  acyclovir (ZOVIRAX) 400 MG tablet, Take 400 mg by mouth 2 (two) times daily. , Disp: , Rfl:  .  albuterol (PROVENTIL HFA;VENTOLIN HFA) 108 (90 Base) MCG/ACT inhaler, Inhale 2 puffs into the lungs every 4 (four) hours as needed for wheezing or shortness of breath., Disp: 1 Inhaler, Rfl: 2 .  ALPRAZolam (XANAX) 0.5 MG tablet, Take 0.5 mg by mouth 3 (three) times daily as needed for anxiety., Disp: , Rfl:  .  amLODipine (NORVASC) 5 MG tablet, Take 5 mg by mouth daily. , Disp: , Rfl:  .  benzonatate (TESSALON PERLES) 100 MG capsule, Take 1 capsule (100 mg total) by mouth 3 (three) times daily as needed for cough., Disp: 30 capsule, Rfl: 0 .  Cholecalciferol (VITAMIN D-1000 MAX ST) 1000 units tablet, Take 1,000 Units by mouth daily., Disp: , Rfl:  .  clotrimazole (LOTRIMIN) 1 % cream, Apply 1 application topically daily as needed (itching). , Disp: , Rfl:  .  cyclobenzaprine (FLEXERIL) 10 MG tablet, Take 1 tablet by mouth as needed for muscle spasms. , Disp: , Rfl:  .  diazepam (VALIUM) 10 MG tablet, Take 10 mg by mouth daily as needed for anxiety., Disp: , Rfl:  .  escitalopram (LEXAPRO) 10 MG tablet, Take 10 mg by mouth daily as needed (anxiety). , Disp: , Rfl:  .  estradiol (ESTRACE) 1 MG tablet, Take 1  mg by mouth daily. , Disp: , Rfl:  .  gabapentin (NEURONTIN) 100 MG capsule, Take 200 mg by mouth every 8 (eight) hours., Disp: , Rfl:  .  liothyronine (CYTOMEL) 5 MCG tablet, Take 1 tablet by mouth daily., Disp: , Rfl:  .  nortriptyline (PAMELOR) 10 MG capsule, Take 1 capsule by mouth at bedtime as needed for sleep. , Disp: , Rfl:  .  nystatin (MYCOSTATIN) 100000 UNIT/ML suspension, Take 100,000 Units by mouth daily. Swish and spit, Disp: , Rfl: 0 .  ondansetron (ZOFRAN) 4 MG tablet, Take 1 tablet (4 mg total) by mouth daily as needed for nausea or vomiting., Disp: 15 tablet, Rfl: 0 .  oxybutynin (DITROPAN-XL) 5 MG 24 hr tablet, Take 5 mg by mouth 2 (two) times daily. , Disp: , Rfl:  .  pantoprazole (PROTONIX) 40 MG tablet, Take 40 mg by mouth daily., Disp: , Rfl:  .  predniSONE (DELTASONE) 10 MG tablet, 4m x 2 days, 36mx 2days, 2062m 2days, 41m50m2days, Disp: , Rfl:  .  tamsulosin (FLOMAX) 0.4 MG CAPS capsule, Take 1 capsule (0.4 mg total) by mouth daily after supper., Disp: 14 capsule, Rfl: 0 .  Tetrahydrozoline HCl (VISINE OP), Apply 1 drop to eye daily as needed (irritation)., Disp: , Rfl:  .  DULoxetine (CYMBALTA) 30 MG capsule, Take 1 capsule (30 mg total) by mouth daily., Disp: 60 capsule, Rfl: 1 .  [START ON 10/08/2018] oxyCODONE-acetaminophen (PERCOCET) 10-325 MG tablet, Take 1 tablet by mouth 2 (two) times daily as needed for up to 30 days for pain., Disp: 60 tablet, Rfl: 0 .  oxyCODONE-acetaminophen (PERCOCET) 10-325 MG tablet, Take 1 tablet by mouth 2 (two) times daily as needed for up to 30 days for pain., Disp: 60 tablet, Rfl: 0  ROS  Constitutional: Denies any fever or chills Gastrointestinal: No reported hemesis, hematochezia, vomiting, or acute GI distress Musculoskeletal: Denies any acute onset joint swelling, redness, loss of ROM, or weakness Neurological:  No reported episodes of acute onset apraxia, aphasia, dysarthria, agnosia, amnesia, paralysis, loss of coordination,  or loss of consciousness  Allergies  Ms. Johndrow is allergic to shellfish allergy; peanut-containing drug products; codeine; diphtheria toxoid; and tetanus toxoids.  [place]  Drug: Ms. Biasi  reports no history of drug use. Alcohol:  reports no history of alcohol use. Tobacco:  reports that she has never smoked. She has never used smokeless tobacco. Medical:  has a past medical history of Anxiety, Arthritis, Asthma, Balance problem, Brain cyst, Depression, Difficult intubation, Flu (08/10/2018), GERD (gastroesophageal reflux disease), Headache, Herpes, History of kidney stones, History of pneumonia, Hypertension, Inflammation of shoulder joint, Memory difficulties, Neuromuscular disorder ([place]), Pneumothorax on right, PONV (postoperative nausea and vomiting), Recurrent falls, Shortness of breath dyspnea, and Urinary frequency. Surgical: Ms. Padron  has a past surgical history that includes Abdominal hysterectomy; Rotator cuff repair (Left); Foreign Body Removal (Left); Carpal tunnel release (Left, 02/16/2013); Carpal tunnel release (Right, 03/27/2013); Cesarean section; Anterior cervical decomp/discectomy fusion (N/A, 08/06/2014); Colonoscopy; Nasal sinus surgery (N/A, 04/13/2015); Shoulder arthroscopy with distal clavicle resection (Left, 05/18/2015); Shoulder acromioplasty (Left, 05/18/2015); Cholecystectomy (N/A, 06/17/2015); Neck surgery (2016); Anterior cervical decomp/discectomy fusion (N/A, 07/04/2016); Posterior cervical fusion/foraminotomy (N/A, 11/13/2016); Knee arthroscopy; and Extracorporeal shock wave lithotripsy (Left, 09/04/2018). Family: family history includes Hypertension in her father and another family member.  Constitutional Exam  General appearance: Well nourished, well developed, and well hydrated. In no apparent acute distress Vitals:   09/08/18 0819  BP: 131/90  Pulse: 89  Temp: 97.6 F (36.4 C)  SpO2: 96%  Weight: 149 lb (67.6 kg)  Height: '5\' 1"'  (1.549 m)  Psych/Mental status:  Alert, oriented x 3 (person, place, & time)       Eyes: PERLA Respiratory: No evidence of acute respiratory distress  Cervical Spine Area Exam  Skin & Axial Inspection: Well healed scar from previous spine surgery detected Alignment: Symmetrical Functional ROM: Unrestricted ROM      Stability: No instability detected Muscle Tone/Strength: Functionally intact. No obvious neuro-muscular anomalies detected. Sensory (Neurological): Unimpaired Palpation: No palpable anomalies              Upper Extremity (UE) Exam    Side: Right upper extremity  Side: Left upper extremity  Skin & Extremity Inspection: Skin color, temperature, and hair growth are WNL. No peripheral edema or cyanosis. No masses, redness, swelling, asymmetry, or associated skin lesions. No contractures.  Skin & Extremity Inspection: Skin color, temperature, and hair growth are WNL. No peripheral edema or cyanosis. No masses, redness, swelling, asymmetry, or associated skin lesions. No contractures.  Functional ROM: Unrestricted ROM          Functional ROM: Unrestricted ROM          Muscle Tone/Strength: Functionally intact. No obvious neuro-muscular anomalies detected.  Muscle Tone/Strength: Functionally intact. No obvious neuro-muscular anomalies detected.  Sensory (Neurological): Unimpaired          Sensory (Neurological): Unimpaired          Palpation: No palpable anomalies              Palpation: No palpable anomalies                    Gait & Posture Assessment  Ambulation: Unassisted Gait: Relatively normal for age and body habitus Posture: WNL   Assessment   Status Diagnosis  Persistent Persistent Persistent 1. Cervical spondylosis without myelopathy   2. Degenerative disc disease, cervical   3. Cervicalgia   4.  Chronic pain syndrome      Updated Problems: Problem  Recurrent Herpes Simplex    Plan of Care  Pharmacotherapy (Medications Ordered): Meds ordered this encounter  Medications  .  oxyCODONE-acetaminophen (PERCOCET) 10-325 MG tablet    Sig: Take 1 tablet by mouth 2 (two) times daily as needed for up to 30 days for pain.    Dispense:  60 tablet    Refill:  0    Do not place this medication, or any other prescription from our practice, on "Automatic Refill". Patient may have prescription filled one day early if pharmacy is closed on scheduled refill date.    Order Specific Question:   Supervising Provider    Answer:   Gillis Santa [[id number]]  . oxyCODONE-acetaminophen (PERCOCET) 10-325 MG tablet    Sig: Take 1 tablet by mouth 2 (two) times daily as needed for up to 30 days for pain.    Dispense:  60 tablet    Refill:  0    Do not place this medication, or any other prescription from our practice, on "Automatic Refill". Patient may have prescription filled one day early if pharmacy is closed on scheduled refill date.    Order Specific Question:   Supervising Provider    Answer:   Gillis Santa [[id number]]   New Prescriptions   No medications on file   Medications administered today: Aleaya T. Lochridge had no medications administered during this visit. Lab-work, procedure(s), and/or referral(s): No orders of the defined types were placed in this encounter.  Imaging and/or referral(s): None  Interventional therapies: Planned, scheduled, and/or pending:   Not at this time.   Provider-requested follow-up: Return in about 2 months (around 11/07/2018) for MedMgmt.  Future Appointments  Date Time Provider [place]  09/15/2018 10:30 AM Billey Co, MD [facility] None  09/29/2018  9:30 AM [facility] [place]  11/04/2018  8:15 AM Vevelyn Francois, NP [facility] None   Primary Care Physician: Glendon Axe, MD Location: [facility] Note by: Vevelyn Francois NP Date: 09/08/2018; Time: 1:16 PM  Pain Score Disclaimer: We use the NRS-11 scale. This is a self-reported, subjective measurement of pain severity with only modest accuracy. It is  used primarily to identify changes within a particular patient. It must be understood that outpatient pain scales are significantly less accurate that those used for research, where they can be applied under ideal controlled circumstances with minimal exposure to variables. In reality, the score is likely to be a combination of pain intensity and pain affect, where pain affect describes the degree of emotional arousal or changes in action readiness caused by the sensory experience of pain. Factors such as social and work situation, setting, emotional state, anxiety levels, expectation, and prior pain experience may influence pain perception and show large inter-individual differences that may also be affected by time variables.  Patient instructions provided during this appointment: Patient Instructions  ____________________________________________________________________________________________  Medication Rules  Purpose: To inform patients, and their family members, of our rules and regulations.  Applies to: All patients receiving prescriptions (written or electronic).  Pharmacy of record: Pharmacy where electronic prescriptions will be sent. If written prescriptions are taken to a different pharmacy, please inform the nursing staff. The pharmacy listed in the electronic medical record should be the one where you would like electronic prescriptions to be sent.  Electronic prescriptions: In compliance with the [place] (STOP) Act of 2017 (Session Law 2017-74/H243), effective July 30, 2018, all controlled substances must be electronically prescribed. Calling prescriptions to the pharmacy will cease to exist.  Prescription refills: Only during scheduled appointments. Applies to all prescriptions.  NOTE: The following applies primarily to controlled substances (Opioid* Pain Medications).   Patient's responsibilities: 1. Pain Pills: Bring all pain pills  to every appointment (except for procedure appointments). 2. Pill Bottles: Bring pills in original pharmacy bottle. Always bring the newest bottle. Bring bottle, even if empty. 3. Medication refills: You are responsible for knowing and keeping track of what medications you take and those you need refilled. The day before your appointment: write a list of all prescriptions that need to be refilled. The day of the appointment: give the list to the admitting nurse. Prescriptions will be written only during appointments. No prescriptions will be written on procedure days. If you forget a medication: it will not be "Called in", "Faxed", or "electronically sent". You will need to get another appointment to get these prescribed. No early refills. Do not call asking to have your prescription filled early. 4. Prescription Accuracy: You are responsible for carefully inspecting your prescriptions before leaving our office. Have the discharge nurse carefully go over each prescription with you, before taking them home. Make sure that your name is accurately spelled, that your address is correct. Check the name and dose of your medication to make sure it is accurate. Check the number of pills, and the written instructions to make sure they are clear and accurate. Make sure that you are given enough medication to last until your next medication refill appointment. 5. Taking Medication: Take medication as prescribed. When it comes to controlled substances, taking less pills or less frequently than prescribed is permitted and encouraged. Never take more pills than instructed. Never take medication more frequently than prescribed.  6. Inform other Doctors: Always inform, all of your healthcare providers, of all the medications you take. 7. Pain Medication from other Providers: You are not allowed to accept any additional pain medication from any other Doctor or Healthcare provider. There are two exceptions to this rule.  (see below) In the event that you require additional pain medication, you are responsible for notifying us, as stated below. 8. Medication Agreement: You are responsible for carefully reading and following our Medication Agreement. This must be signed before receiving any prescriptions from our practice. Safely store a copy of your signed Agreement. Violations to the Agreement will result in no further prescriptions. (Additional copies of our Medication Agreement are available upon request.) 9. Laws, Rules, & Regulations: All patients are expected to follow all Federal and [employer], [employer], Rules, [employer]. Ignorance of the Laws does not constitute a valid excuse. The use of any illegal substances is prohibited. 10. Adopted CDC guidelines & recommendations: Target dosing levels will be at or below 60 MME/day. Use of benzodiazepines** is not recommended.  Exceptions: There are only two exceptions to the rule of not receiving pain medications from other Healthcare Providers. 1. Exception #1 (Emergencies): In the event of an emergency (i.e.: accident requiring emergency care), you are allowed to receive additional pain medication. However, you are responsible for: As soon as you are able, call our office [phone], at any time of the day or night, and leave a message stating your name, the date and nature of the emergency, and the name and dose of the medication prescribed. In the event that your call is answered by a member of our staff, make sure to document and save  the date, time, and the name of the person that took your information.  2. Exception #2 (Planned Surgery): In the event that you are scheduled by another doctor or dentist to have any type of surgery or procedure, you are allowed (for a period no longer than 30 days), to receive additional pain medication, for the acute post-op pain. However, in this case, you are responsible for picking up a copy of our "Post-op Pain Management for  Surgeons" handout, and giving it to your surgeon or dentist. This document is available at our office, and does not require an appointment to obtain it. Simply go to our office during business hours (Monday-Thursday from 8:00 AM to 4:00 PM) (Friday 8:00 AM to 12:00 Noon) or if you have a scheduled appointment with [place], prior to your surgery, and ask for it by name. In addition, you will need to provide [place] with your name, name of your surgeon, type of surgery, and date of procedure or surgery.  *Opioid medications include: morphine, codeine, oxycodone, oxymorphone, hydrocodone, hydromorphone, meperidine, tramadol, tapentadol, buprenorphine, fentanyl, methadone. **Benzodiazepine medications include: diazepam (Valium), alprazolam (Xanax), clonazepam (Klonopine), lorazepam (Ativan), clorazepate (Tranxene), chlordiazepoxide (Librium), estazolam (Prosom), oxazepam (Serax), temazepam (Restoril), triazolam (Halcion) (Last updated: 09/26/2017) ____________________________________________________________________________________________   ____________________________________________________________________________________________  Appointment Policy Summary  It is our goal and responsibility to provide the medical community with assistance in the evaluation and management of patients with chronic pain. Unfortunately our resources are limited. Because we do not have an unlimited amount of time, or available appointments, we are required to closely monitor and manage their use. The following rules exist to maximize their use:  Patient's responsibilities: 1. Punctuality:  At what time should I arrive? You should be physically present in our office 30 minutes before your scheduled appointment. Your scheduled appointment is with your assigned healthcare provider. However, it takes 5-10 minutes to be "checked-in", and another 15 minutes for the nurses to do the admission. If you arrive to our office at the time you  were given for your appointment, you will end up being at least 20-25 minutes late to your appointment with the provider. 2. Tardiness:  What happens if I arrive only a few minutes after my scheduled appointment time? You will need to reschedule your appointment. The cutoff is your appointment time. This is why it is so important that you arrive at least 30 minutes before that appointment. If you have an appointment scheduled for 10:00 AM and you arrive at 10:01, you will be required to reschedule your appointment.  3. Plan ahead:  Always assume that you will encounter traffic on your way in. Plan for it. If you are dependent on a driver, make sure they understand these rules and the need to arrive early. 4. Other appointments and responsibilities:  Avoid scheduling any other appointments before or after your pain clinic appointments.  5. Be prepared:  Write down everything that you need to discuss with your healthcare provider and give this information to the admitting nurse. Write down the medications that you will need refilled. Bring your pills and bottles (even the empty ones), to all of your appointments, except for those where a procedure is scheduled. 6. No children or pets:  Find someone to take care of them. It is not appropriate to bring them in. 7. Scheduling changes:  We request "advanced notification" of any changes or cancellations. 8. Advanced notification:  Defined as a time period of more than 24 hours prior to the originally scheduled appointment.  This allows for the appointment to be offered to other patients. 9. Rescheduling:  When a visit is rescheduled, it will require the cancellation of the original appointment. For this reason they both fall within the category of "Cancellations".  10. Cancellations:  They require advanced notification. Any cancellation less than 24 hours before the  appointment will be recorded as a "No Show". 11. No Show:  Defined as an unkept  appointment where the patient failed to notify or declare to the practice their intention or inability to keep the appointment.  Corrective process for repeat offenders:  1. Tardiness: Three (3) episodes of rescheduling due to late arrivals will be recorded as one (1) "No Show". 2. Cancellation or reschedule: Three (3) cancellations or rescheduling will be recorded as one (1) "No Show". 3. "No Shows": Three (3) "No Shows" within a 12 month period will result in discharge from the practice. ____________________________________________________________________________________________

## 2018-09-10 DIAGNOSIS — M542 Cervicalgia: Secondary | ICD-10-CM | POA: Diagnosis not present

## 2018-09-15 ENCOUNTER — Encounter: Payer: Self-pay | Admitting: Urology

## 2018-09-15 ENCOUNTER — Ambulatory Visit (INDEPENDENT_AMBULATORY_CARE_PROVIDER_SITE_OTHER): Payer: Medicare Other | Admitting: Urology

## 2018-09-15 ENCOUNTER — Ambulatory Visit
Admission: RE | Admit: 2018-09-15 | Discharge: 2018-09-15 | Disposition: A | Discharge status: Home / Self Care | Payer: Medicare Other | Source: Ambulatory Visit | Attending: Urology | Admitting: Urology

## 2018-09-15 VITALS — BP 124/86 | HR 97 | Ht 61.0 in | Wt 149.0 lb

## 2018-09-15 DIAGNOSIS — R310 Gross hematuria: Secondary | ICD-10-CM | POA: Diagnosis not present

## 2018-09-15 DIAGNOSIS — N200 Calculus of kidney: Secondary | ICD-10-CM | POA: Diagnosis not present

## 2018-09-15 LAB — URINALYSIS, COMPLETE
Bilirubin, UA: NEGATIVE
Glucose, UA: NEGATIVE
Ketones, UA: NEGATIVE
Leukocytes, UA: NEGATIVE
Nitrite, UA: NEGATIVE
Protein, UA: NEGATIVE
Specific Gravity, UA: 1.02 (ref 1.005–1.030)
UUROB: 0.2 mg/dL (ref 0.2–1.0)
pH, UA: 7 (ref 5.0–7.5)

## 2018-09-15 LAB — MICROSCOPIC EXAMINATION

## 2018-09-15 NOTE — Progress Notes (Signed)
   09/15/2018 11:53 AM   Kimberly [name] [birth year] [id number]  Reason for visit: Follow up left SWL  HPI: I saw Kimberly [name] back in urology clinic for follow-up after left SWL 09/04/2018 for a left lower pole 7 mm nonobstructive stone, 850HU, 9 cm skin to stone distance.  She had some mild hematuria for 24 hours after the procedure, but denies any flank pain or other complications.  Overall she is doing very well.  Physical Exam: BP 124/86 (BP Location: Left Arm, Patient Position: Sitting, Cuff Size: Normal)   Pulse 97   Ht 5\' 1"  (1.549 m)   Wt 149 lb (67.6 kg)   BMI 28.15 kg/m    KUB personally reviewed, no significant residual fragments  We discussed general stone prevention strategies including adequate hydration with goal of producing 2.5 L of urine daily, increasing citric acid intake, increasing calcium intake during high oxalate meals, minimizing animal protein, and decreasing salt intake. Information about dietary recommendations given today.   RTC 1 year for KUB  A total of 10 minutes were spent face-to-face with the patient, greater than 50% was spent in patient education, counseling, and coordination of care regarding postop expectations, KUB results, and stone prevention strategies.   Billey Co, [facility] [address] [place], [place] [address] [phone]

## 2018-09-15 NOTE — Patient Instructions (Signed)
Dietary Guidelines to Help Prevent Kidney Stones Kidney stones are deposits of minerals and salts that form inside your kidneys. Your risk of developing kidney stones may be greater depending on your diet, your lifestyle, the medicines you take, and whether you have certain medical conditions. Most people can reduce their chances of developing kidney stones by following the instructions below. Depending on your overall health and the type of kidney stones you tend to develop, your dietitian may give you more specific instructions. What are tips for following this plan? Reading food labels  Choose foods with "no salt added" or "low-salt" labels. Limit your sodium intake to less than 1500 mg per day.  Choose foods with calcium for each meal and snack. Try to eat about 300 mg of calcium at each meal. Foods that contain 200-500 mg of calcium per serving include: ? 8 oz (237 ml) of milk, fortified nondairy milk, and fortified fruit juice. ? 8 oz (237 ml) of kefir, yogurt, and soy yogurt. ? 4 oz (118 ml) of tofu. ? 1 oz of cheese. ? 1 cup (300 g) of dried figs. ? 1 cup (91 g) of cooked broccoli. ? 1-3 oz can of sardines or mackerel.  Most people need 1000 to 1500 mg of calcium each day. Talk to your dietitian about how much calcium is recommended for you. Shopping  Buy plenty of fresh fruits and vegetables. Most people do not need to avoid fruits and vegetables, even if they contain nutrients that may contribute to kidney stones.  When shopping for convenience foods, choose: ? Whole pieces of fruit. ? Premade salads with dressing on the side. ? Low-fat fruit and yogurt smoothies.  Avoid buying frozen meals or prepared deli foods.  Look for foods with live cultures, such as yogurt and kefir. Cooking  Do not add salt to food when cooking. Place a salt shaker on the table and allow each person to add his or her own salt to taste.  Use vegetable protein, such as beans, textured vegetable  protein (TVP), or tofu instead of meat in pasta, casseroles, and soups. Meal planning   Eat less salt, if told by your dietitian. To do this: ? Avoid eating processed or premade food. ? Avoid eating fast food.  Eat less animal protein, including cheese, meat, poultry, or fish, if told by your dietitian. To do this: ? Limit the number of times you have meat, poultry, fish, or cheese each week. Eat a diet free of meat at least 2 days a week. ? Eat only one serving each day of meat, poultry, fish, or seafood. ? When you prepare animal protein, cut pieces into small portion sizes. For most meat and fish, one serving is about the size of one deck of cards.  Eat at least 5 servings of fresh fruits and vegetables each day. To do this: ? Keep fruits and vegetables on hand for snacks. ? Eat 1 piece of fruit or a handful of berries with breakfast. ? Have a salad and fruit at lunch. ? Have two kinds of vegetables at dinner.  Limit foods that are high in a substance called oxalate. These include: ? Spinach. ? Rhubarb. ? Beets. ? Potato chips and french fries. ? Nuts.  If you regularly take a diuretic medicine, make sure to eat at least 1-2 fruits or vegetables high in potassium each day. These include: ? Avocado. ? Banana. ? Orange, prune, carrot, or tomato juice. ? Baked potato. ? Cabbage. ? Beans and split   If you regularly take a diuretic medicine, make sure to eat at least 1-2 fruits or vegetables high in potassium each day. These include:  ? Avocado.  ? Banana.  ? Orange, prune, carrot, or tomato juice.  ? Baked potato.  ? Cabbage.  ? Beans and split peas.  General instructions     Drink enough fluid to keep your urine clear or pale yellow. This is the most important thing you can do.   Talk to your health care provider and dietitian about taking daily supplements. Depending on your health and the cause of your kidney stones, you may be advised:  ? Not to take supplements with vitamin C.  ? To take a calcium supplement.  ? To take a daily probiotic supplement.  ? To take other supplements such as magnesium, fish oil, or vitamin B6.   Take all medicines and supplements as told by your health care provider.   Limit alcohol intake to no  more than 1 drink a day for nonpregnant women and 2 drinks a day for men. One drink equals 12 oz of beer, 5 oz of wine, or 1 oz of hard liquor.   Lose weight if told by your health care provider. Work with your dietitian to find strategies and an eating plan that works best for you.  What foods are not recommended?  Limit your intake of the following foods, or as told by your dietitian. Talk to your dietitian about specific foods you should avoid based on the type of kidney stones and your overall health.  Grains  Breads. Bagels. Rolls. Baked goods. Salted crackers. Cereal. Pasta.  Vegetables  Spinach. Rhubarb. Beets. Canned vegetables. Pickles. Olives.  Meats and other protein foods  Nuts. Nut butters. Large portions of meat, poultry, or fish. Salted or cured meats. Deli meats. Hot dogs. Sausages.  Dairy  Cheese.  Beverages  Regular soft drinks. Regular vegetable juice.  Seasonings and other foods  Seasoning blends with salt. Salad dressings. Canned soups. Soy sauce. Ketchup. Barbecue sauce. Canned pasta sauce. Casseroles. Pizza. Lasagna. Frozen meals. Potato chips. French fries.  Summary   You can reduce your risk of kidney stones by making changes to your diet.   The most important thing you can do is drink enough fluid. You should drink enough fluid to keep your urine clear or pale yellow.   Ask your health care provider or dietitian how much protein from animal sources you should eat each day, and also how much salt and calcium you should have each day.  This information is not intended to replace advice given to you by your health care provider. Make sure you discuss any questions you have with your health care provider.  Document Released: 11/10/2010 Document Revised: 06/26/2016 Document Reviewed: 06/26/2016  Elsevier Interactive Patient Education  2019 [employer]

## 2018-09-18 DIAGNOSIS — E039 Hypothyroidism, unspecified: Secondary | ICD-10-CM | POA: Diagnosis not present

## 2018-09-23 IMAGING — MR MR CERVICAL SPINE W/O CM
5 series · 35 of 48 positions shown · non-contrast
Comparison: 04/20/2016 CT angiogram.  12/14/2015 cervical spine MR.

CLINICAL DATA: 55-year-old female with neck and bilateral shoulder/
arm pain. Surgery 6660. Subsequent encounter.

EXAM:
MRI CERVICAL SPINE WITHOUT CONTRAST
TECHNIQUE: Multiplanar, multisequence MR imaging of the cervical spine was
performed. No intravenous contrast was administered.

[Series 4: T1 · sagittal · 3.0mm · 0.70mm/px · 8 of 16 slices shown]
[im 1/16]
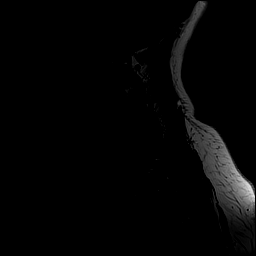
[im 3/16]
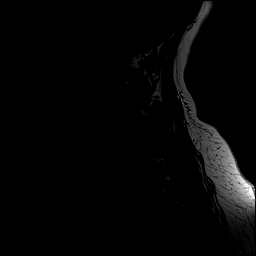
[im 5/16]
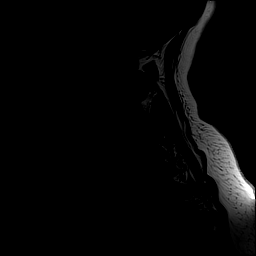
[im 7/16]
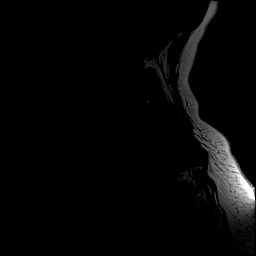
[im 9/16]
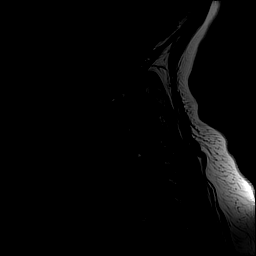
[im 11/16]
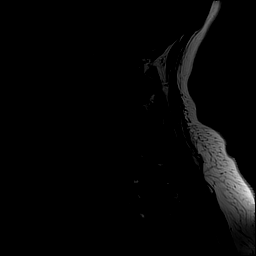
[im 13/16]
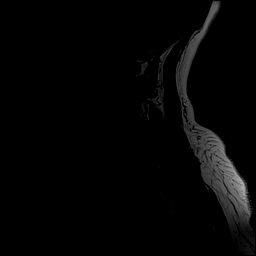
[im 16/16]
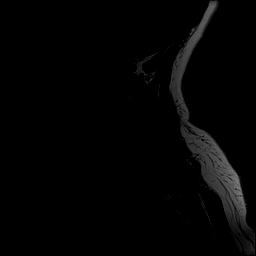

[Series 5: T2 · sagittal · 3.0mm · 0.70mm/px · 7 of 16 slices shown (1 of 2)]
[im 1/16]
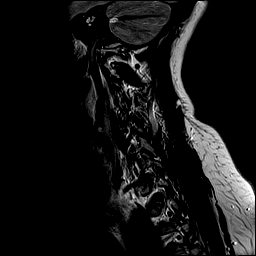
[im 3/16]
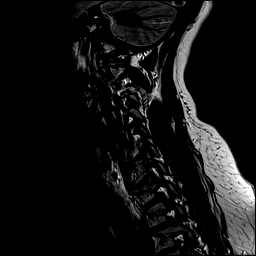
[im 6/16]
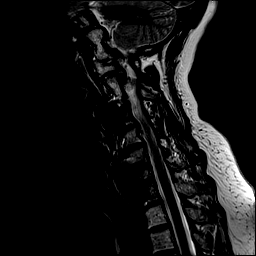
[im 8/16]
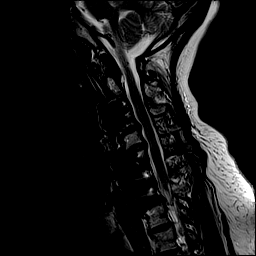
[im 11/16]
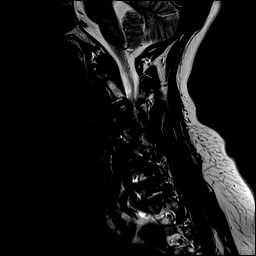
[im 13/16]
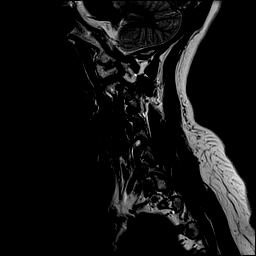
[im 16/16]
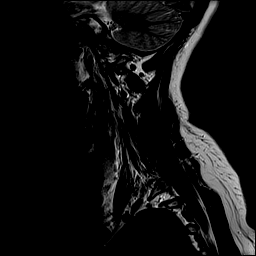

[Series 6: STIR · sagittal · 3.0mm · 0.35mm/px · 7 of 16 slices shown]
[im 1/16]
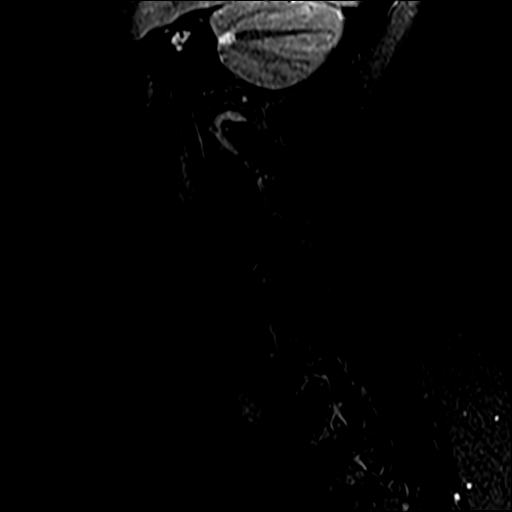
[im 3/16]
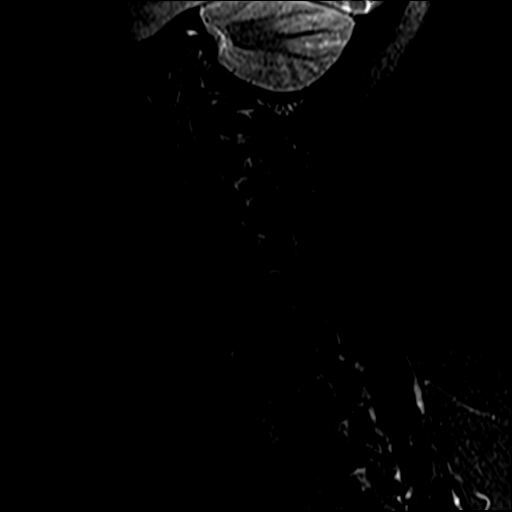
[im 6/16]
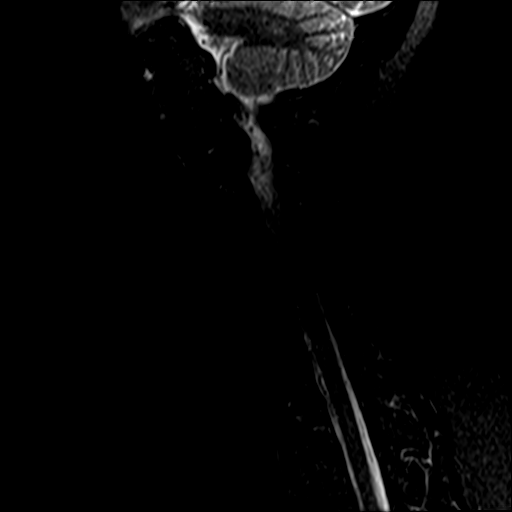
[im 8/16]
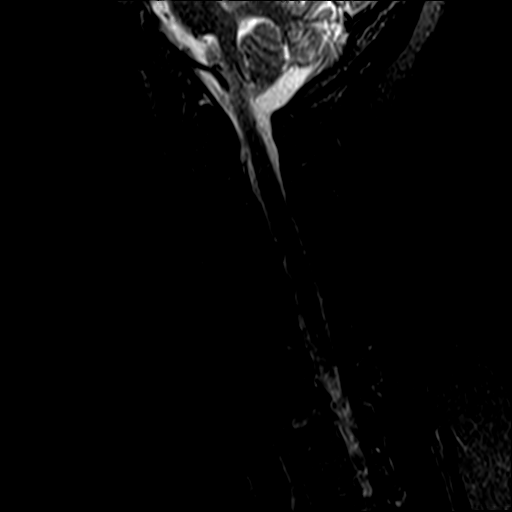
[im 11/16]
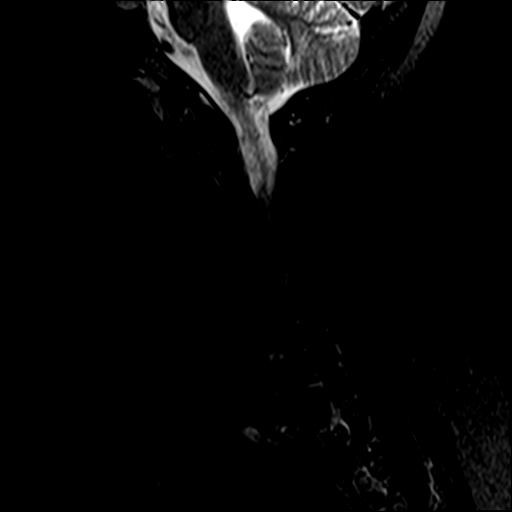
[im 13/16]
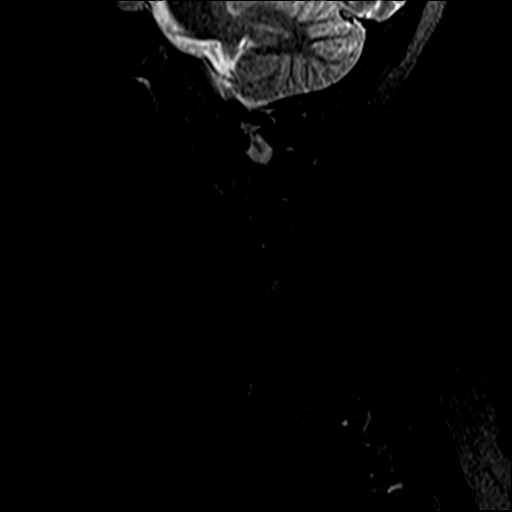
[im 16/16]
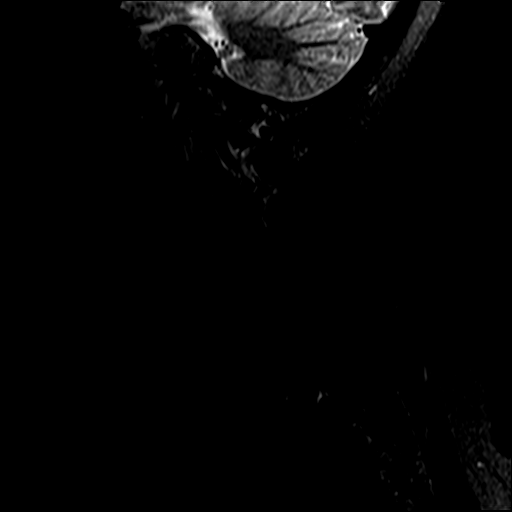

[Series 7: T2 · axial · 3.0mm · 0.70mm/px · z∈[-47,+54]mm · 9 of 28 slices shown (2 of 2)]
[im 1/28]
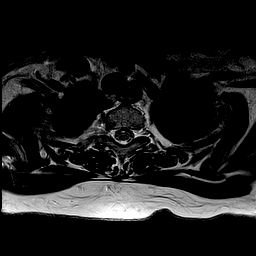
[im 5/28]
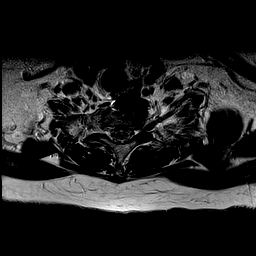
[im 10/28]
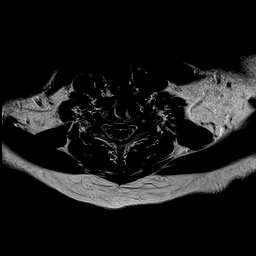
[im 12/28]
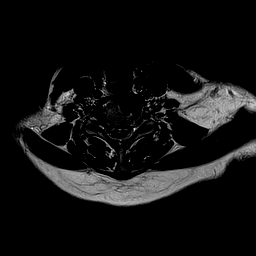
[im 14/28]
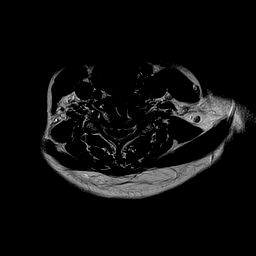
[im 16/28]
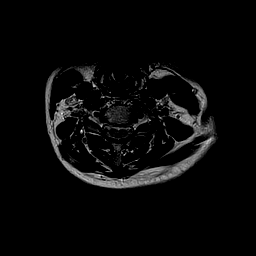
[im 19/28]
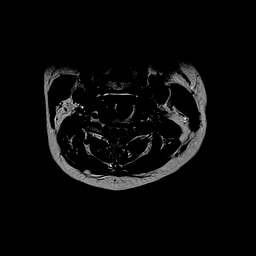
[im 23/28]
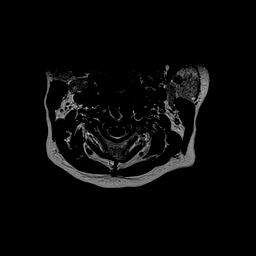
[im 28/28]
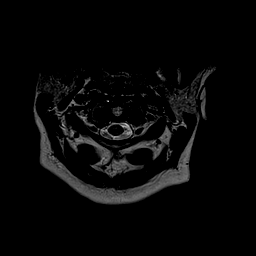

[Series 8: mpgr ax · axial · 3.0mm · 0.35mm/px · z∈[-47,-6]mm · 4 of 28 slices shown]
[im 1/28]
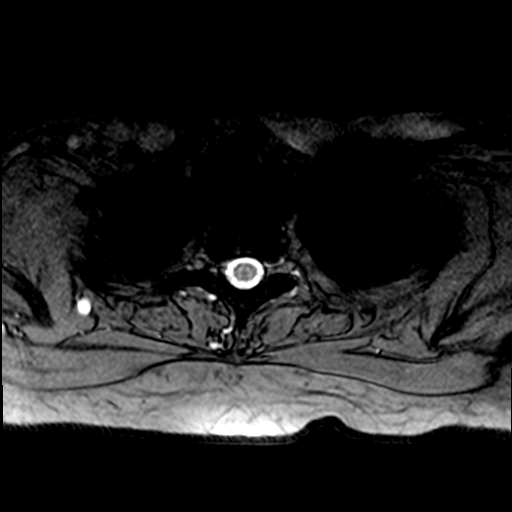
[im 5/28]
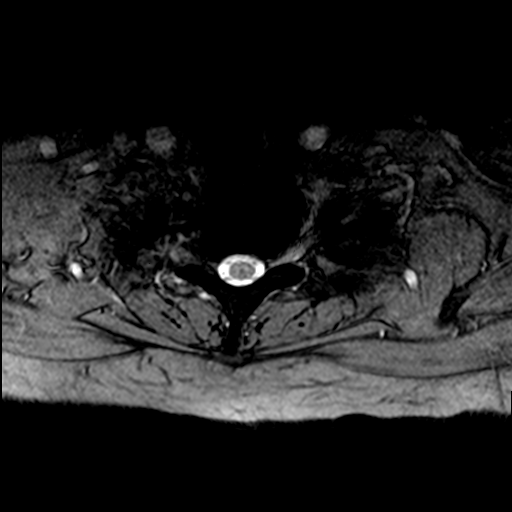
[im 10/28]
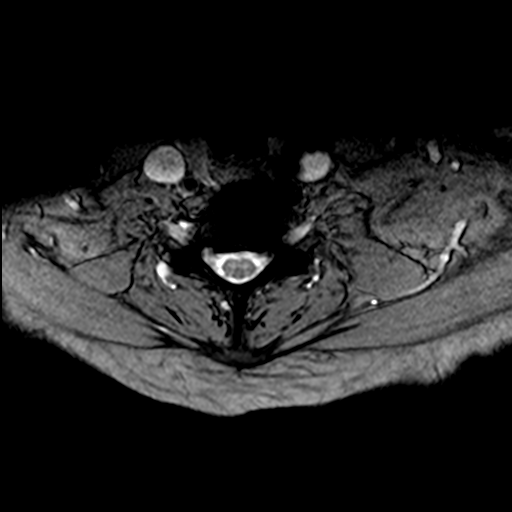
[im 12/28]
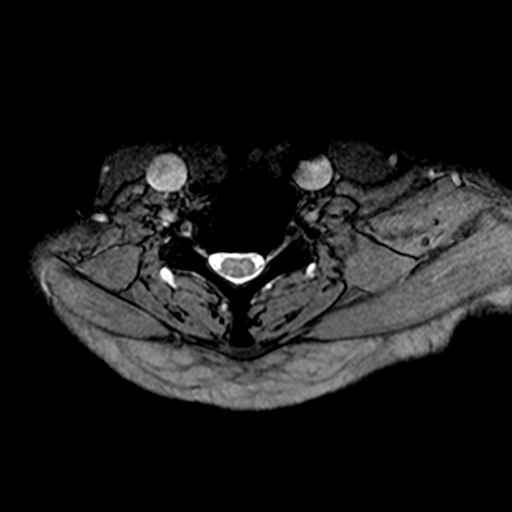

[35 of 48 positions shown; findings below may reference images not displayed]

FINDINGS: Alignment: Mild curvature.

Vertebrae: Congenital fusion C2-3. Remote anterior discectomy and
attempted fusion C4-5. Fusion C6 through T1. On the prior CT,
osseous fusion noted involving portions of the C6-7 and C7-T1 disc
space. No solid bony fusion at the C3-4 level.

Cord: Artifact right aspect of the cord at the C6 level.

Posterior Fossa, vertebral arteries, paraspinal tissues: Top-normal
size level 2 lymph nodes bilaterally.

Disc levels:

C2-3:  Congenital fusion.

C3-4: Prior discectomy and attempted fusion. Minimal bilateral
foraminal narrowing.

C4-5: Moderate bulge. Narrowing ventral thecal sac with minimal to
mild cord flattening. Uncinate hypertrophy. Minimal right-sided and
moderate left-sided foraminal narrowing.

C5-6: Bulge. Narrowing ventral thecal sac. Minimal cord contact.
Uncinate hypertrophy with minimal foraminal narrowing greater on the
right.

C6-7: Prior discectomy and fusion. No significant spinal stenosis.
Mild bilateral foraminal narrowing greater on the right.

C7-T1: Prior discectomy and fusion. No significant spinal stenosis.
Mild bilateral foraminal narrowing.

T1-2: Minimal anterior slip T1. Minimal bulge. Spur with mild
bilateral foraminal narrowing greater on the right.

T2-3:  Minimal right foraminal narrowing.

T3-4:  Minimal right foraminal narrowing.
IMPRESSION: Congenital fusion of C2-3, prior surgical fusion C3-4 (without solid
bony fusion) and prior surgical fusion C6-T1.

Interval mild progression of cervical spondylotic changes at the
C4-5 and C5-6 level as detailed above.

## 2018-09-27 IMAGING — CT CT CERVICAL SPINE W/ CM
3 of 4 series · 12 of 33 positions shown, 14 images · non-contrast
Comparison: none

CLINICAL DATA: Cervical radiculopathy. Bilateral neck and arm pain,
left greater than right. Pain is worse in the neck in the arms.
Paresthesias in both hands.
TECHNIQUE: Contiguous axial images were obtained through the Cervical spine
after the intrathecal infusion of infusion. Coronal and sagittal
reconstructions were obtained of the axial image sets.

[Series 7: cor · coronal · 0.26mm/px · 3 of 57 slices shown]
[im 12/57  bone]
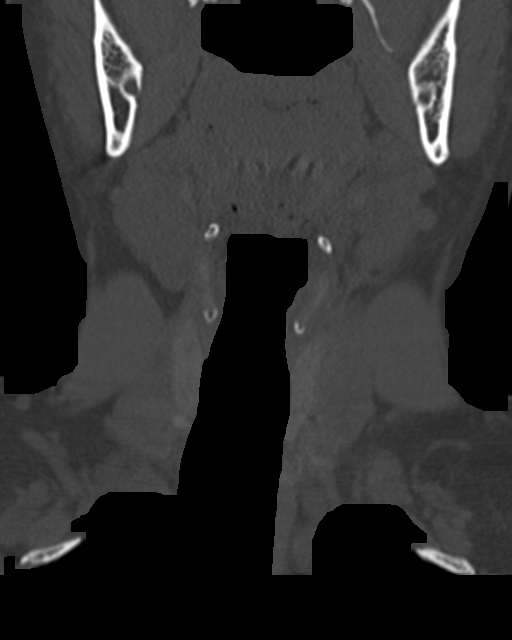
[im 23/57  bone]
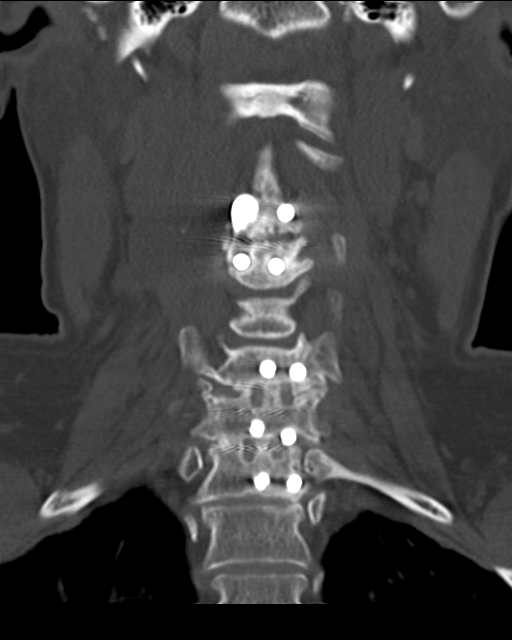
[im 34/57  bone]
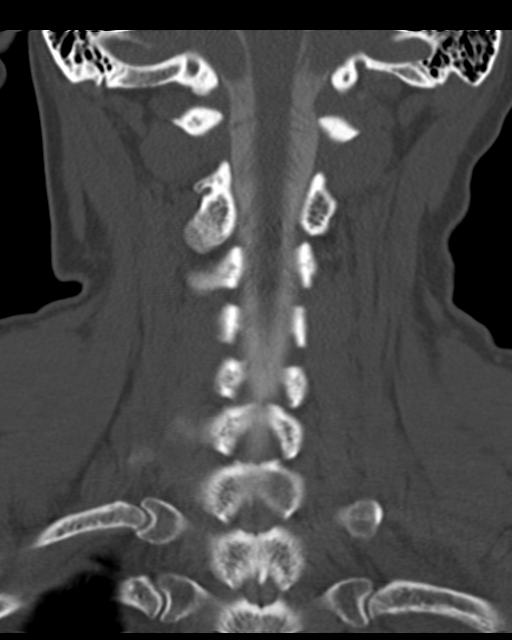

[Series 8: sag · sagittal · 0.23mm/px · 5 of 61 slices shown, 6 images]
[im 21/61  bone]
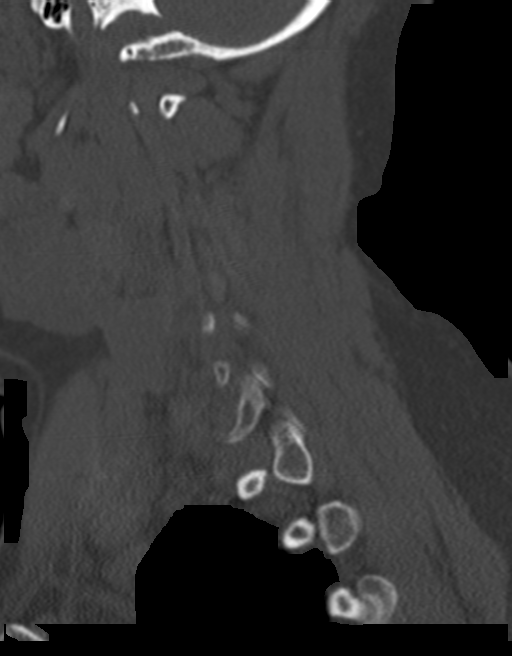
[im 26/61  bone]
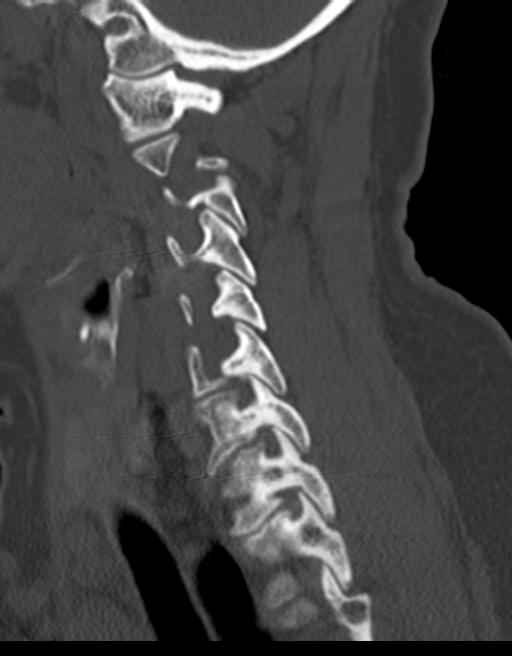
[im 31/61  soft-tissue]
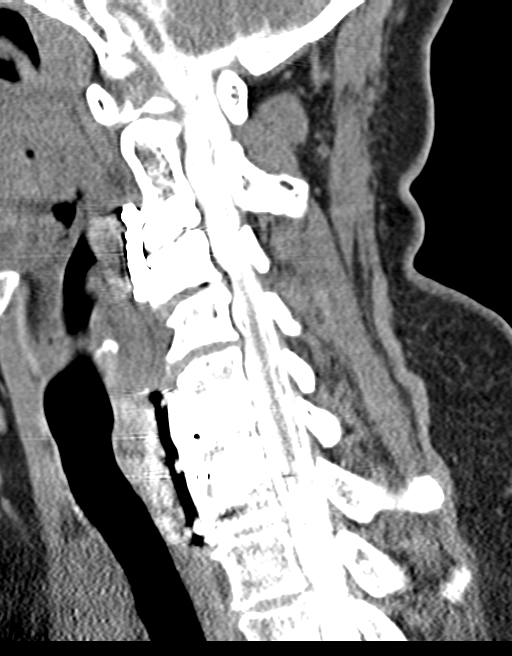
[im 31/61  bone]
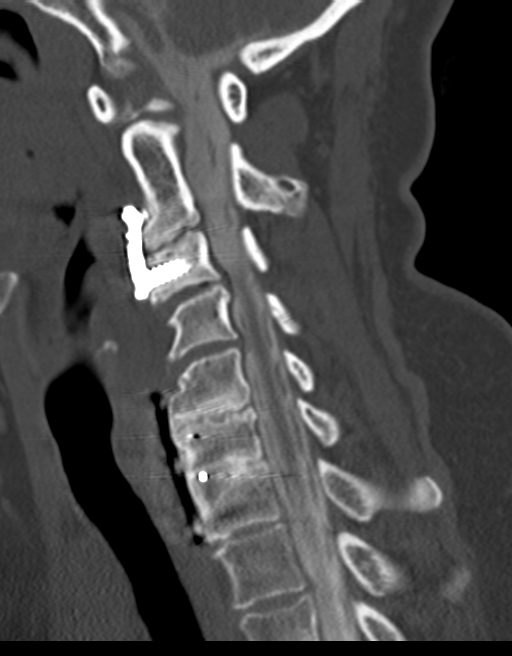
[im 36/61  bone]
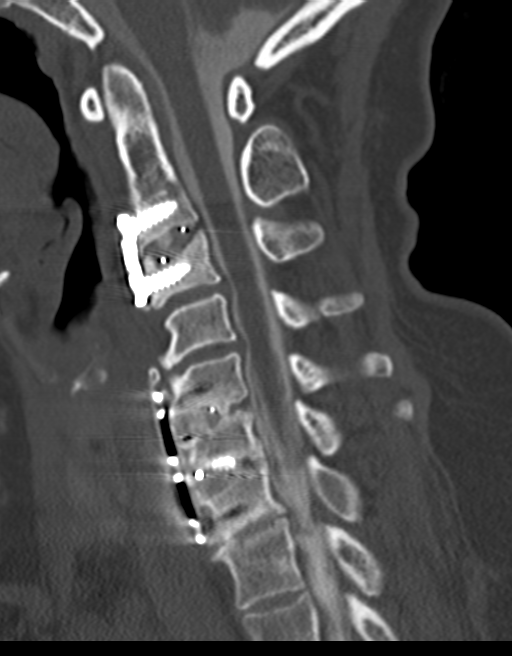
[im 41/61  bone]
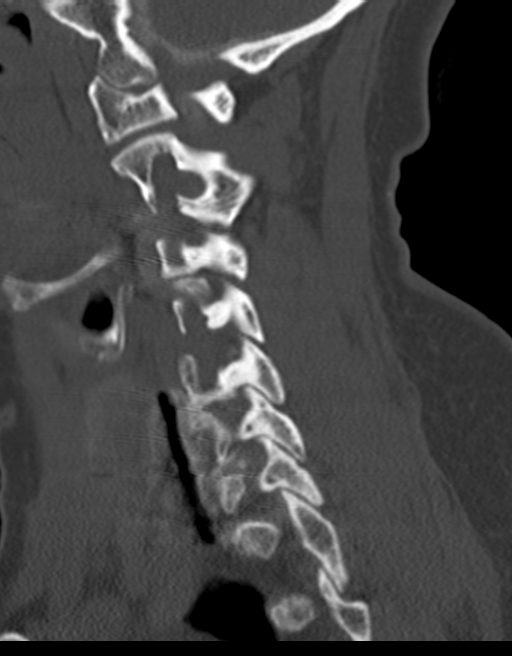

[Series 9: angled axial · axial · 0.21mm/px · z∈[-528,-424]mm · 4 of 82 slices shown, 5 images]
[im 14/82  soft-tissue]
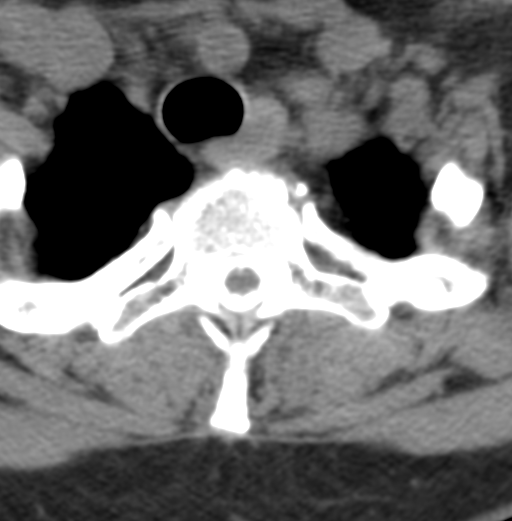
[im 14/82  bone]
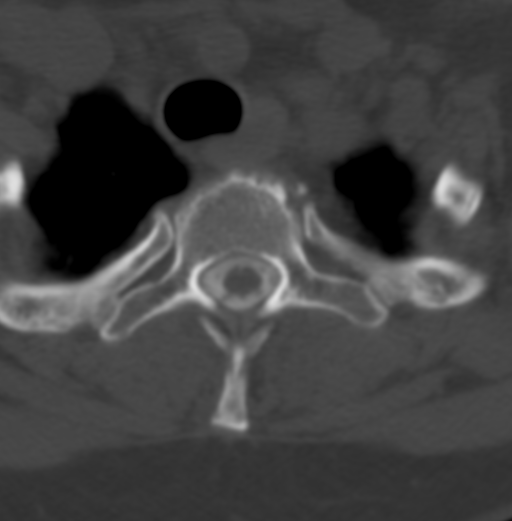
[im 28/82  bone]
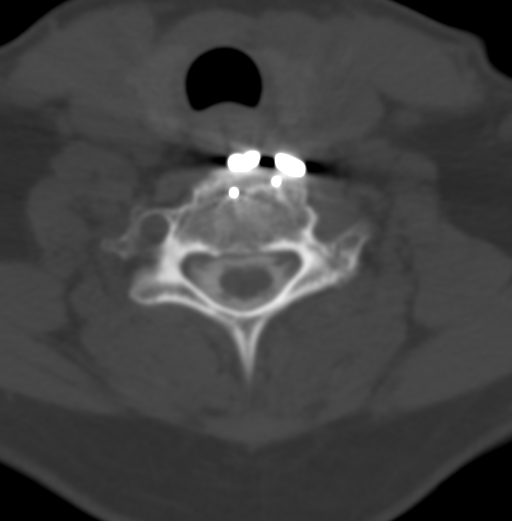
[im 55/82  bone]
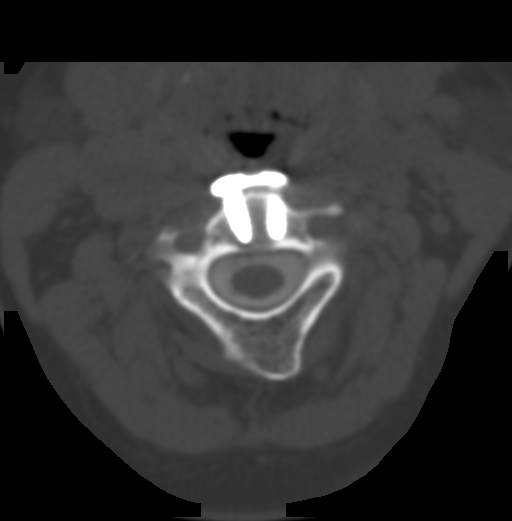
[im 68/82  bone]
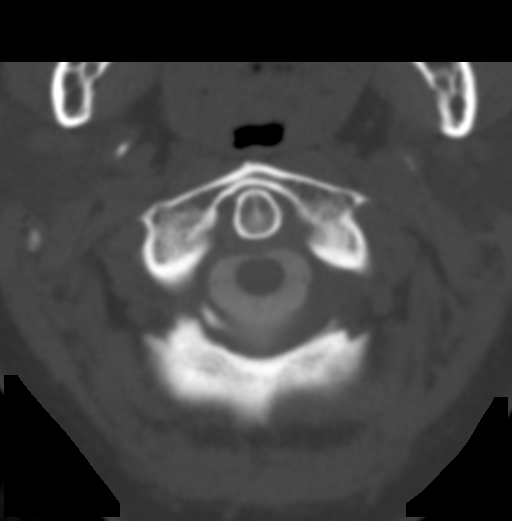

[12 of 33 positions shown; findings below may reference images not displayed]

FLUOROSCOPY TIME:  Radiation Exposure Index (as provided by the
fluoroscopic device): 108.6 uGy*m2

Fluoroscopy Time:  41 seconds

Number of Acquired Images:  14

PROCEDURE:
LUMBAR PUNCTURE FOR CERVICAL MYELOGRAM

After thorough discussion of risks and benefits of the procedure
including bleeding, infection, injury to nerves, blood vessels,
adjacent structures as well as headache and CSF leak, written and
oral informed consent was obtained. Consent was obtained by Dr.
Auksas Vitaite. We discussed the high likelihood of obtaining a
diagnostic study.

Patient was positioned prone on the fluoroscopy table. Local
anesthesia was provided with 1% lidocaine without epinephrine after
prepped and draped in the usual sterile fashion. Puncture was
performed at L2-3 using a 3 1/2 inch 22-gauge spinal needle via a
left paramedian approach. Using a single pass through the dura, the
needle was placed within the thecal sac, with return of clear CSF.
10 mL of Isovue-M 200 was injected into the thecal sac, with normal
opacification of the nerve roots and cauda equina consistent with
free flow within the subarachnoid space. The patient was then moved
to the trendelenburg position and contrast flowed into the Cervical
spine region.

I personally performed the lumbar puncture and administered the
intrathecal contrast. I also personally supervised acquisition of
the myelogram images.
FINDINGS: CERVICAL MYELOGRAM FINDINGS:

Anterior cervical fusion hardware is present at C2-3 scratched
anterior cervical fusion hardware is present at C3-4, C6-7, and
C7-T1. There is significant blunting of the nerve roots bilaterally
at C4-5, left greater than right a prominent broad-based disc
osteophyte complex. A more moderate broad-based disc osteophyte
complex is present at C5-6. There is progression of both levels. The
central canal is decompressed at C3-4, C6-7, and C7-T1.

AP alignment is anatomic. There is some straightening of the normal
cervical lordosis. Exaggerated motion is present at C4-5 with slight
anterolisthesis in flexion and retrolisthesis in extension. There is
no abnormal movement across the fused segments. There is congenital
fusion at C2-3.

CT CERVICAL MYELOGRAM FINDINGS:

Cervical spine is imaged from the skullbase through the midbody of
T3. Congenital fusion is noted at C2-3 anteriorly and posteriorly.
Anterior spinal fusion hardware epididymis C3-4 without evidence of
loosening. There is no bridging bone across the disc space. Solid
anterior fusion is present at C6-7 and C7. The soft tissues of the
neck are within normal limits. The lung apices are clear.

C3-4: Bilateral uncovertebral spurring has progressed. Mild
foraminal narrowing is present bilaterally. The central canal is
patent.

C4-5: A broad-based disc osteophyte complex partially effaces the
ventral CSF. Progressive bilateral uncovertebral spurring is
present. Moderate left and mild right foraminal narrowing has
progressed slightly.

C5-6: A broad-based disc osteophyte complex is present.
Uncovertebral spurring is worse on the right. Mild right foraminal
narrowing is worse.

C6-7: Solid anterior fusion is present. The central canal is
decompressed. The left foramen is patent. Uncovertebral spurring
contributes to mild residual right foraminal narrowing.

C7-T1: Solid anterior fusion is present. Uncovertebral spurring
contributes to moderate right and mild left residual foraminal
stenosis.

T1-2: There is some progression of uncovertebral spurring
bilaterally with moderate bilateral foraminal stenosis, right
greater than left.
IMPRESSION: 1. Congenital cervical fusion at C2-3.
2. Anterior fusion hardware at C3-4 without definitive bridging bone
across the disc space.
3. Solid anterior fusion at C6-7 and C7-T1.
4. Progressive uncovertebral spurring at C3-4 mild foraminal
narrowing bilaterally.
5. Progressive bilateral uncovertebral spurring with moderate left
and mild right foraminal stenosis at C4-5.
6. Broad-based disc osteophyte complex with mild uncovertebral
spurring at C5-6, right greater than left.
7. Slight progression of mild right foraminal narrowing at C5-6.
8. Uncovertebral spurring with mild residual right foraminal
narrowing at C6-7, moderate right, and mild left residual foraminal
stenosis at C7-T1.
9. Progression of uncovertebral spurring and moderate bilateral
foraminal stenosis at T1-2, right greater than left.

## 2018-09-29 ENCOUNTER — Other Ambulatory Visit: Payer: Self-pay

## 2018-09-29 ENCOUNTER — Ambulatory Visit
Admission: RE | Admit: 2018-09-29 | Discharge: 2018-09-29 | Disposition: A | Discharge status: Home / Self Care | Payer: Medicare Other | Source: Ambulatory Visit | Attending: Gastroenterology | Admitting: Gastroenterology

## 2018-09-29 ENCOUNTER — Ambulatory Visit
Admission: RE | Admit: 2018-09-29 | Discharge: 2018-09-29 | Disposition: A | Discharge status: Home / Self Care | Payer: Medicare Other | Source: Ambulatory Visit | Attending: Urology | Admitting: Urology

## 2018-09-29 DIAGNOSIS — N200 Calculus of kidney: Secondary | ICD-10-CM | POA: Diagnosis not present

## 2018-09-29 DIAGNOSIS — K2100 Gastro-esophageal reflux disease with esophagitis, without bleeding: Secondary | ICD-10-CM

## 2018-09-29 MED ORDER — TECHNETIUM TC 99M SULFUR COLLOID
2.0000 | Freq: Once | INTRAVENOUS | Status: AC | PRN
  Administered 2018-09-29: 2.66 via INTRAVENOUS

## 2018-10-15 DIAGNOSIS — R079 Chest pain, unspecified: Secondary | ICD-10-CM | POA: Diagnosis not present

## 2018-10-15 DIAGNOSIS — M62838 Other muscle spasm: Secondary | ICD-10-CM | POA: Diagnosis not present

## 2018-10-20 ENCOUNTER — Other Ambulatory Visit: Payer: Self-pay | Admitting: Internal Medicine

## 2018-10-20 DIAGNOSIS — N644 Mastodynia: Secondary | ICD-10-CM

## 2018-10-22 ENCOUNTER — Ambulatory Visit
Admission: RE | Admit: 2018-10-22 | Discharge: 2018-10-22 | Disposition: A | Discharge status: Home / Self Care | Payer: Medicare Other | Source: Ambulatory Visit | Attending: Internal Medicine | Admitting: Internal Medicine

## 2018-10-22 ENCOUNTER — Other Ambulatory Visit: Payer: Self-pay

## 2018-10-22 DIAGNOSIS — N644 Mastodynia: Secondary | ICD-10-CM

## 2018-10-22 DIAGNOSIS — R222 Localized swelling, mass and lump, trunk: Secondary | ICD-10-CM | POA: Diagnosis not present

## 2018-10-22 DIAGNOSIS — R921 Mammographic calcification found on diagnostic imaging of breast: Secondary | ICD-10-CM | POA: Diagnosis not present

## 2018-10-23 DIAGNOSIS — M546 Pain in thoracic spine: Secondary | ICD-10-CM | POA: Diagnosis not present

## 2018-10-23 DIAGNOSIS — K210 Gastro-esophageal reflux disease with esophagitis: Secondary | ICD-10-CM | POA: Diagnosis not present

## 2018-10-23 DIAGNOSIS — I10 Essential (primary) hypertension: Secondary | ICD-10-CM | POA: Diagnosis not present

## 2018-10-23 DIAGNOSIS — M7918 Myalgia, other site: Secondary | ICD-10-CM | POA: Diagnosis not present

## 2018-10-24 DIAGNOSIS — R8290 Unspecified abnormal findings in urine: Secondary | ICD-10-CM | POA: Diagnosis not present

## 2018-11-03 DIAGNOSIS — M5489 Other dorsalgia: Secondary | ICD-10-CM | POA: Diagnosis not present

## 2018-11-04 ENCOUNTER — Ambulatory Visit: Payer: Medicare Other | Attending: Nurse Practitioner | Admitting: Nurse Practitioner

## 2018-11-04 ENCOUNTER — Other Ambulatory Visit: Payer: Self-pay

## 2018-11-04 DIAGNOSIS — M47812 Spondylosis without myelopathy or radiculopathy, cervical region: Secondary | ICD-10-CM

## 2018-11-04 DIAGNOSIS — Z79899 Other long term (current) drug therapy: Secondary | ICD-10-CM

## 2018-11-04 DIAGNOSIS — M5030 Other cervical disc degeneration, unspecified cervical region: Secondary | ICD-10-CM

## 2018-11-04 DIAGNOSIS — Q761 Klippel-Feil syndrome: Secondary | ICD-10-CM | POA: Diagnosis not present

## 2018-11-04 MED ORDER — OXYCODONE-ACETAMINOPHEN 10-325 MG PO TABS: 1.0000 | ORAL_TABLET | Freq: Two times a day (BID) | ORAL | 0 refills | Status: AC | PRN

## 2018-11-04 MED ORDER — DULOXETINE HCL 30 MG PO CPEP: 30.0000 mg | ORAL_CAPSULE | Freq: Every day | ORAL | 0 refills | Status: DC

## 2018-11-04 NOTE — Progress Notes (Signed)
Pain Management Encounter Note - Virtual Visit via Telephone Telehealth (real-time audio visits between healthcare provider and patient).  Patient's Phone No. & Preferred Pharmacy:  [phone] (home); [phone] (mobile); (Preferred) [place], [place] - [address] [place] [address] Phone: [phone] Fax: [phone]   Pre-screening note:  Our staff contacted Ms. Parr and offered her an "in person", "face-to-face" appointment versus a telephone encounter. She indicated preferring the telephone encounter, at this time.  Reason for Virtual Visit: COVID-19*  Social distancing based on CDC and AMA recommendations.   I contacted [name] on 11/04/2018 at 8:15 AM by telephone and clearly identified myself as Dionisio David, NP. I verified that I was speaking with the correct person using two identifiers (Name and date of birth: 1961/02/12).  Advanced Informed Consent I sought verbal advanced consent from [name] for telemedicine interactions and virtual visit. I informed Ms. Keetch of the security and privacy concerns, risks, and limitations associated with performing an evaluation and management service by telephone. I also informed Ms. Sheller of the availability of "in person" appointments and I informed her of the possibility of a patient responsible charge related to this service. Ms. Rohlman expressed understanding and agreed to proceed.   Historic Elements   Kimberly [name] is a 58 y.o. year old, female patient evaluated today after her last encounter by our practice on 09/08/2018. Ms. Sonier  has a past medical history of Anxiety, Arthritis, Asthma, Balance problem, Brain cyst, Depression, Difficult intubation, Flu (08/10/2018), GERD (gastroesophageal reflux disease), Headache, Herpes, History of kidney stones, History of pneumonia, Hypertension, Inflammation of shoulder joint, Memory difficulties,  Neuromuscular disorder ([place]), Pneumothorax on right, PONV (postoperative nausea and vomiting), Recurrent falls, Shortness of breath dyspnea, and Urinary frequency. She also  has a past surgical history that includes Abdominal hysterectomy; Rotator cuff repair (Left); Foreign Body Removal (Left); Carpal tunnel release (Left, 02/16/2013); Carpal tunnel release (Right, 03/27/2013); Cesarean section; Anterior cervical decomp/discectomy fusion (N/A, 08/06/2014); Colonoscopy; Nasal sinus surgery (N/A, 04/13/2015); Shoulder arthroscopy with distal clavicle resection (Left, 05/18/2015); Shoulder acromioplasty (Left, 05/18/2015); Cholecystectomy (N/A, 06/17/2015); Neck surgery (2016); Anterior cervical decomp/discectomy fusion (N/A, 07/04/2016); Posterior cervical fusion/foraminotomy (N/A, 11/13/2016); Knee arthroscopy; and Extracorporeal shock wave lithotripsy (Left, 09/04/2018). Ms. Hutt has a current medication list which includes the following prescription(s): acetaminophen, acyclovir, albuterol, alprazolam, amlodipine, benzonatate, cholecalciferol, clotrimazole, cyclobenzaprine, diazepam, duloxetine, escitalopram, estradiol, gabapentin, liothyronine, nortriptyline, nystatin, ondansetron, oxybutynin, oxycodone-acetaminophen, oxycodone-acetaminophen, oxycodone-acetaminophen, pantoprazole, prednisone, tamsulosin, and tetrahydrozoline hcl. She  reports that she has never smoked. She has never used smokeless tobacco. She reports that she does not drink alcohol or use drugs. Ms. Tahir is allergic to shellfish allergy; peanut-containing drug products; codeine; diphtheria toxoid; and tetanus toxoids.   HPI  I last saw her on 09/08/2018. She is being evaluated for medication management.  She was seen by on yesterday by her PCP for increased pain. She was started on Prednisone. She was told to increase her Oxycodone at her previous visit for increased pain She is having 7/10. She admits that her upper back pain. She admits that the  pain maybe related to her previous surgery on her cervical spine. She denies any recent injuries or falls. She does not have pain with breathing. She is concern about this pain and hope to get it reduced to a 1-2/10. She is having some increased stress related to some personal issues.  Pharmacotherapy Assessment  Analgesic:Percocet 10 mg BID prn MME/day:30mg /day.  Monitoring: Pharmacotherapy: No side-effects or adverse reactions reported. [place] PMP: PDMP not reviewed this encounter.       Compliance: No problems identified. Plan: Refer to "POC".  Review of recent tests  US BREAST LTD UNI RIGHT INC AXILLA CLINICAL DATA:  Palpable lump in the right chest wall above the breast.  EXAM: DIGITAL DIAGNOSTIC BILATERAL MAMMOGRAM WITH CAD AND TOMO  ULTRASOUND RIGHT BREAST  COMPARISON:  Previous exam(s).  ACR Breast Density Category b: There are scattered areas of fibroglandular density.  FINDINGS: Two asymmetries in the right breast resolve on additional imaging. Stable benign calcifications are seen on the left. No suspicious findings are seen in either breast.  Mammographic images were processed with CAD.  On physical exam, no suspicious lumps are identified.  Targeted ultrasound is performed, showing no sonographic correlate to the patient's focal pain in the superior right chest wall above the breast.  IMPRESSION: No mammographic or sonographic evidence of malignancy.  RECOMMENDATION: Treatment of the patient's focal pain should be based on clinical and physical exam given lack of imaging findings. The region of pain is higher than could be evaluated with mammography. The ultrasound was normal in this region. If clinical concern persists, a CT scan or MRI of the chest wall could better evaluate this region. Recommend annual screening mammography.  I have discussed the findings and recommendations with the patient. Results were also provided in writing at the conclusion  of the visit. If applicable, a reminder letter will be sent to the patient regarding the next appointment.  BI-RADS CATEGORY  2: Benign.  Electronically Signed   By: Dorise Bullion III M.D   On: 10/22/2018 15:26 MM DIAG BREAST TOMO BILATERAL CLINICAL DATA:  Palpable lump in the right chest wall above the breast.  EXAM: DIGITAL DIAGNOSTIC BILATERAL MAMMOGRAM WITH CAD AND TOMO  ULTRASOUND RIGHT BREAST  COMPARISON:  Previous exam(s).  ACR Breast Density Category b: There are scattered areas of fibroglandular density.  FINDINGS: Two asymmetries in the right breast resolve on additional imaging. Stable benign calcifications are seen on the left. No suspicious findings are seen in either breast.  Mammographic images were processed with CAD.  On physical exam, no suspicious lumps are identified.  Targeted ultrasound is performed, showing no sonographic correlate to the patient's focal pain in the superior right chest wall above the breast.  IMPRESSION: No mammographic or sonographic evidence of malignancy.  RECOMMENDATION: Treatment of the patient's focal pain should be based on clinical and physical exam given lack of imaging findings. The region of pain is higher than could be evaluated with mammography. The ultrasound was normal in this region. If clinical concern persists, a CT scan or MRI of the chest wall could better evaluate this region. Recommend annual screening mammography.  I have discussed the findings and recommendations with the patient. Results were also provided in writing at the conclusion of the visit. If applicable, a reminder letter will be sent to the patient regarding the next appointment.  BI-RADS CATEGORY  2: Benign.  Electronically Signed   By: Dorise Bullion III M.D   On: 10/22/2018 15:26   Office Visit on 09/15/2018  Component Date Value Ref Range Status  . Specific Gravity, UA 09/15/2018 1.020  1.005 - 1.030 Final  . pH, UA  09/15/2018 7.0  5.0 - 7.5 Final  . Color, UA 09/15/2018 Yellow  Yellow Final  . Appearance Ur 09/15/2018 Cloudy* Clear Final  . Leukocytes, UA 09/15/2018 Negative  Negative Final  . Protein,  UA 09/15/2018 Negative  Negative/Trace Final  . Glucose, UA 09/15/2018 Negative  Negative Final  . Ketones, UA 09/15/2018 Negative  Negative Final  . RBC, UA 09/15/2018 2+* Negative Final  . Bilirubin, UA 09/15/2018 Negative  Negative Final  . Urobilinogen, Ur 09/15/2018 0.2  0.2 - 1.0 mg/dL Final  . Nitrite, UA 09/15/2018 Negative  Negative Final  . Microscopic Examination 09/15/2018 See below:   Final  . WBC, UA 09/15/2018 0-5  0 - 5 /hpf Final  . RBC, UA 09/15/2018 3-10* 0 - 2 /hpf Final  . Epithelial Cells (non renal) 09/15/2018 0-10  0 - 10 /hpf Final  . Mucus, UA 09/15/2018 Present* Not Estab. Final  . Bacteria, UA 09/15/2018 Moderate* None seen/Few Final  . Yeast, UA 09/15/2018 Present* None seen Final   Assessment  The primary encounter diagnosis was Cervical spondylosis without myelopathy. Diagnoses of Cervical fusion syndrome, Degenerative disc disease, cervical, and Controlled substance agreement signed were also pertinent to this visit.  Plan of Care  I have changed Yuko T. Blyden's oxyCODONE-acetaminophen and oxyCODONE-acetaminophen. I am also having her maintain her amLODipine, estradiol, acyclovir, escitalopram, oxybutynin, clotrimazole, cyclobenzaprine, nortriptyline, albuterol, pantoprazole, diazepam, acetaminophen, Tetrahydrozoline HCl (VISINE OP), ALPRAZolam, nystatin, Cholecalciferol, gabapentin, liothyronine, predniSONE, ondansetron, benzonatate, tamsulosin, DULoxetine, and oxyCODONE-acetaminophen.  Pharmacotherapy (Medications Ordered): Meds ordered this encounter  Medications  . DULoxetine (CYMBALTA) 30 MG capsule    Sig: Take 1 capsule (30 mg total) by mouth daily.    Dispense:  90 capsule    Refill:  0    Order Specific Question:   Supervising Provider    Answer:    Gillis Santa [[id number]]  . oxyCODONE-acetaminophen (PERCOCET) 10-325 MG tablet    Sig: Take 1 tablet by mouth 2 (two) times daily as needed for up to 30 days for pain.    Dispense:  60 tablet    Refill:  0    Do not place this medication, or any other prescription from our practice, on "Automatic Refill". Patient may have prescription filled one day early if pharmacy is closed on scheduled refill date.    Order Specific Question:   Supervising Provider    Answer:   Gillis Santa [[id number]]  . oxyCODONE-acetaminophen (PERCOCET) 10-325 MG tablet    Sig: Take 1 tablet by mouth 2 (two) times daily as needed for up to 30 days for pain.    Dispense:  60 tablet    Refill:  0    Do not place this medication, or any other prescription from our practice, on "Automatic Refill". Patient may have prescription filled one day early if pharmacy is closed on scheduled refill date.    Order Specific Question:   Supervising Provider    Answer:   Gillis Santa [[id number]]  . oxyCODONE-acetaminophen (PERCOCET) 10-325 MG tablet    Sig: Take 1 tablet by mouth 2 (two) times daily as needed for up to 30 days for pain.    Dispense:  60 tablet    Refill:  0    Do not place this medication, or any other prescription from our practice, on "Automatic Refill". Patient may have prescription filled one day early if pharmacy is closed on scheduled refill date.    Order Specific Question:   Supervising Provider    Answer:   Gillis Santa [[id number]]   Orders:  No orders of the defined types were placed in this encounter.  Follow-up plan:   Return in about 3 months (around 02/03/2019) for MedMgmt.   I discussed the  assessment and treatment plan with the patient. The patient was provided an opportunity to ask questions and all were answered. The patient agreed with the plan and demonstrated an understanding of the instructions.  Patient advised to call back or seek an in-person evaluation if the symptoms or condition  worsens.  Total duration of non-face-to-face encounter: 14 minutes.  Note by: Dionisio David, NP Date: 11/04/2018; Time: 10:22 AM  Disclaimer:  * Given the special circumstances of the COVID-19 pandemic, the federal government has announced that the Office for Civil Rights (OCR) will exercise its enforcement discretion and will not impose penalties on physicians using telehealth in the event of noncompliance with regulatory requirements under the [place] (HIPAA) in connection with the good faith provision of telehealth during the BVQXI-50 national public health emergency. ([place])

## 2018-11-10 DIAGNOSIS — M542 Cervicalgia: Secondary | ICD-10-CM | POA: Diagnosis not present

## 2018-11-10 DIAGNOSIS — E039 Hypothyroidism, unspecified: Secondary | ICD-10-CM | POA: Diagnosis not present

## 2018-11-10 DIAGNOSIS — I10 Essential (primary) hypertension: Secondary | ICD-10-CM | POA: Diagnosis not present

## 2018-11-10 DIAGNOSIS — K219 Gastro-esophageal reflux disease without esophagitis: Secondary | ICD-10-CM | POA: Diagnosis not present

## 2018-11-20 IMAGING — CR DG CERVICAL SPINE 2 OR 3 VIEWS
2 series · 2 of 2 positions shown · non-contrast
Comparison: CT 05/11/2016

CLINICAL DATA: C4-5, C5-6 ACDF.

EXAM:
CERVICAL SPINE - 2-3 VIEW

[AP (1 of 2)]
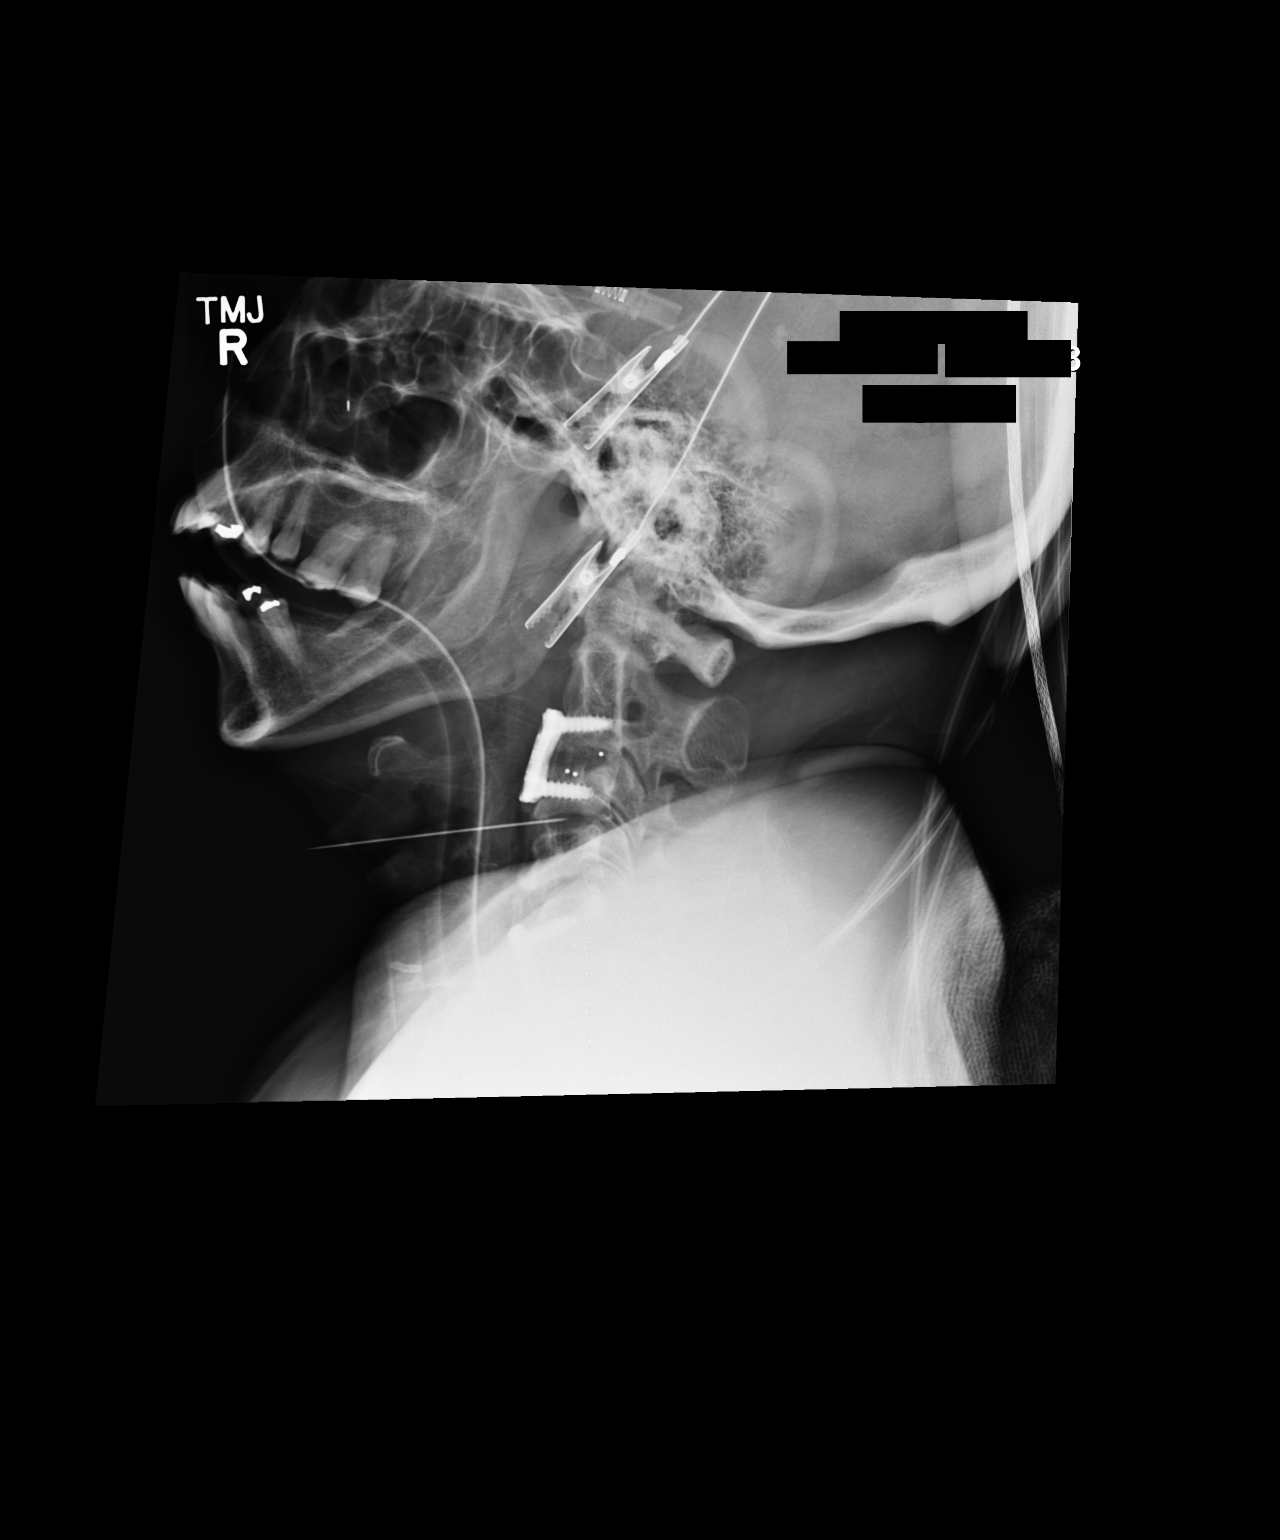

[AP (2 of 2)]
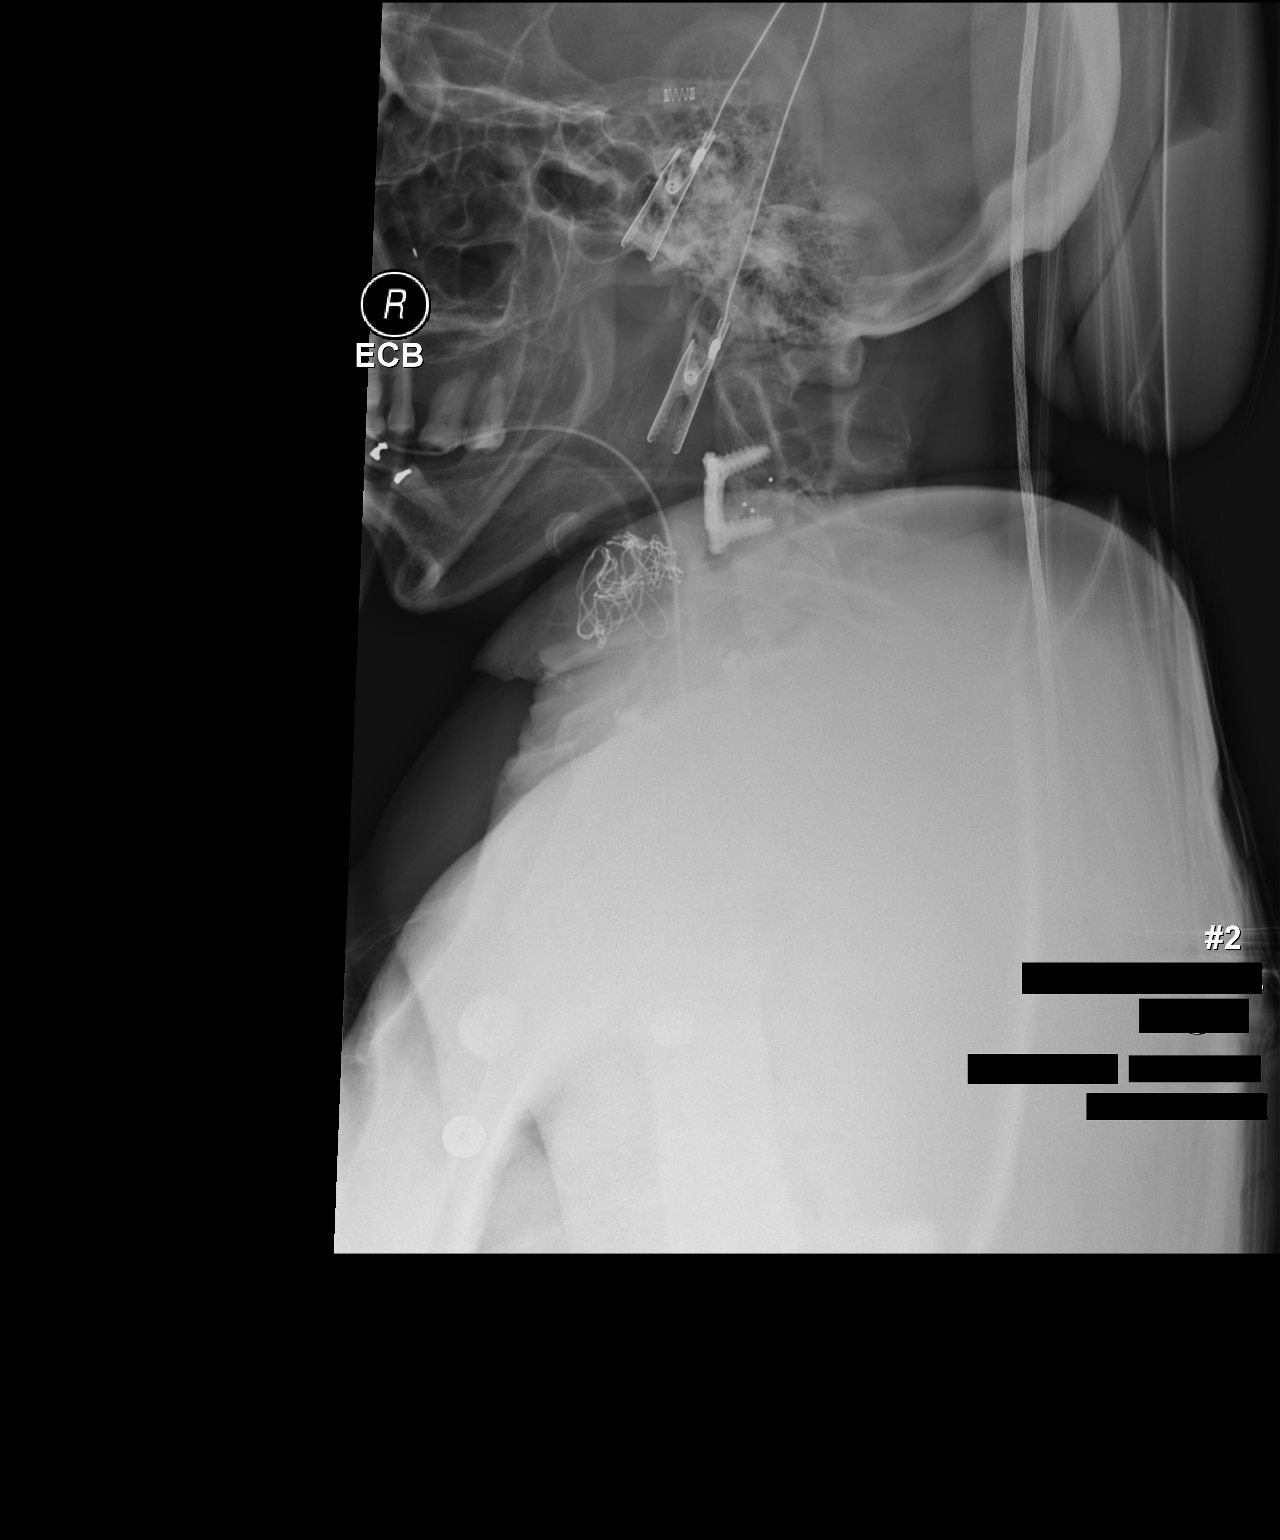

[2 of 2 positions shown; findings below may reference images not displayed]

FINDINGS: Two cross-table fluoroscopic spot views obtained in the operating
room during elective cervical spine surgery. C2-C3 fusion, as seen
on prior CT. Anterior cervical fusion hardware at C3-C4 is again
seen and unchanged. Surgical instrument localizes to C4-C5 disc
space a on image number 1. Lower cervical hardware on prior CT is
partially obscured on the current exam. There is placement of an
interbody spacer at C4-C5 and C5-C6. Fluoroscopy time not reported.
IMPRESSION: Intraoperative fluoroscopy during cervical spine surgery, with
instruments localizing to C4-C5 on initial image.

## 2018-11-21 IMAGING — CR DG CHEST 1V PORT
1 series · 1 of 1 positions shown · non-contrast
Comparison: July 05, 2016

CLINICAL DATA: Insertion of right chest tube.

EXAM:
PORTABLE CHEST 1 VIEW

[portable]
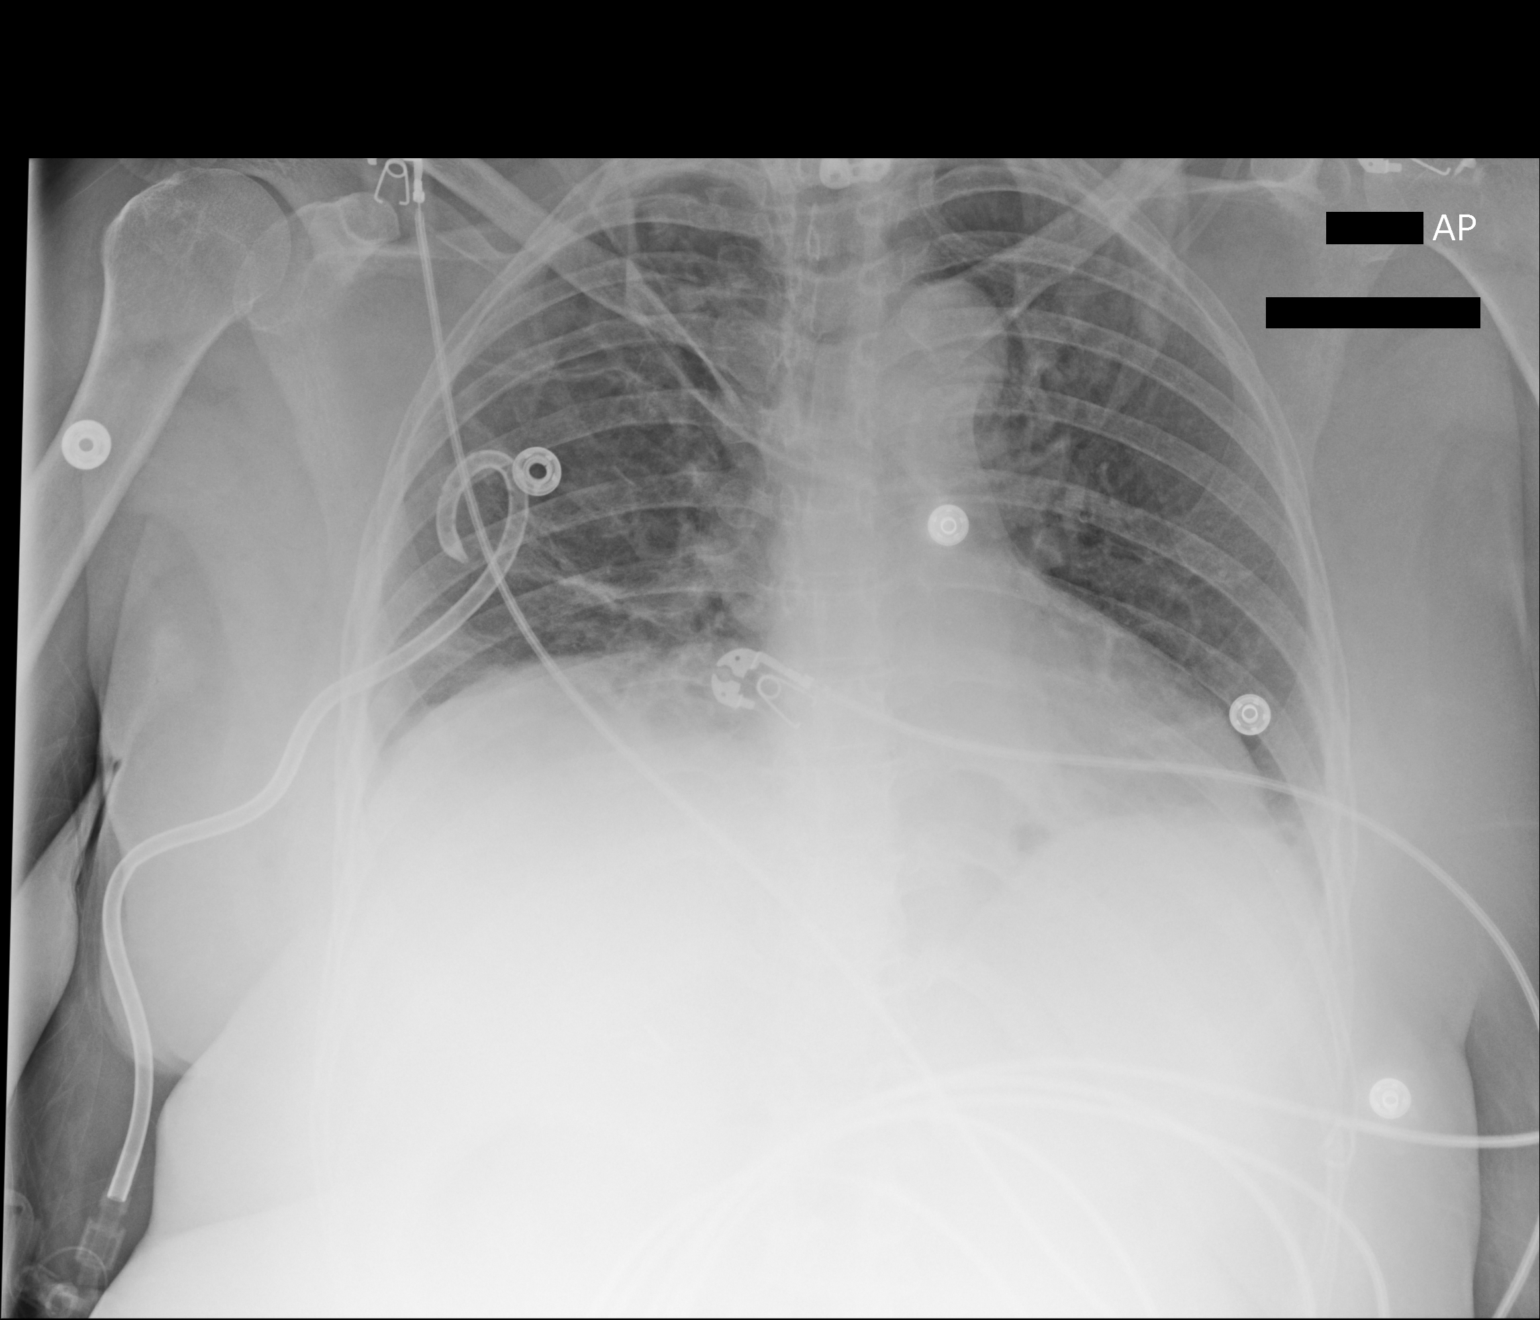

[1 of 1 positions shown; findings below may reference images not displayed]

FINDINGS: A new right chest tube is identified. The previously seen
right-sided pneumothorax has resolved. Mild atelectasis at the left
base. Mild atelectasis in the medial right base. No other interval
changes or acute abnormalities.
IMPRESSION: Placement of right chest tube with resolution of right-sided
pneumothorax. Bibasilar atelectasis.

## 2018-11-22 IMAGING — CR DG CHEST 1V PORT
1 series · 1 of 1 positions shown · non-contrast
Comparison: Radiograph July 05, 2016.

CLINICAL DATA: Shortness of breath.

EXAM:
PORTABLE CHEST 1 VIEW

[AP]
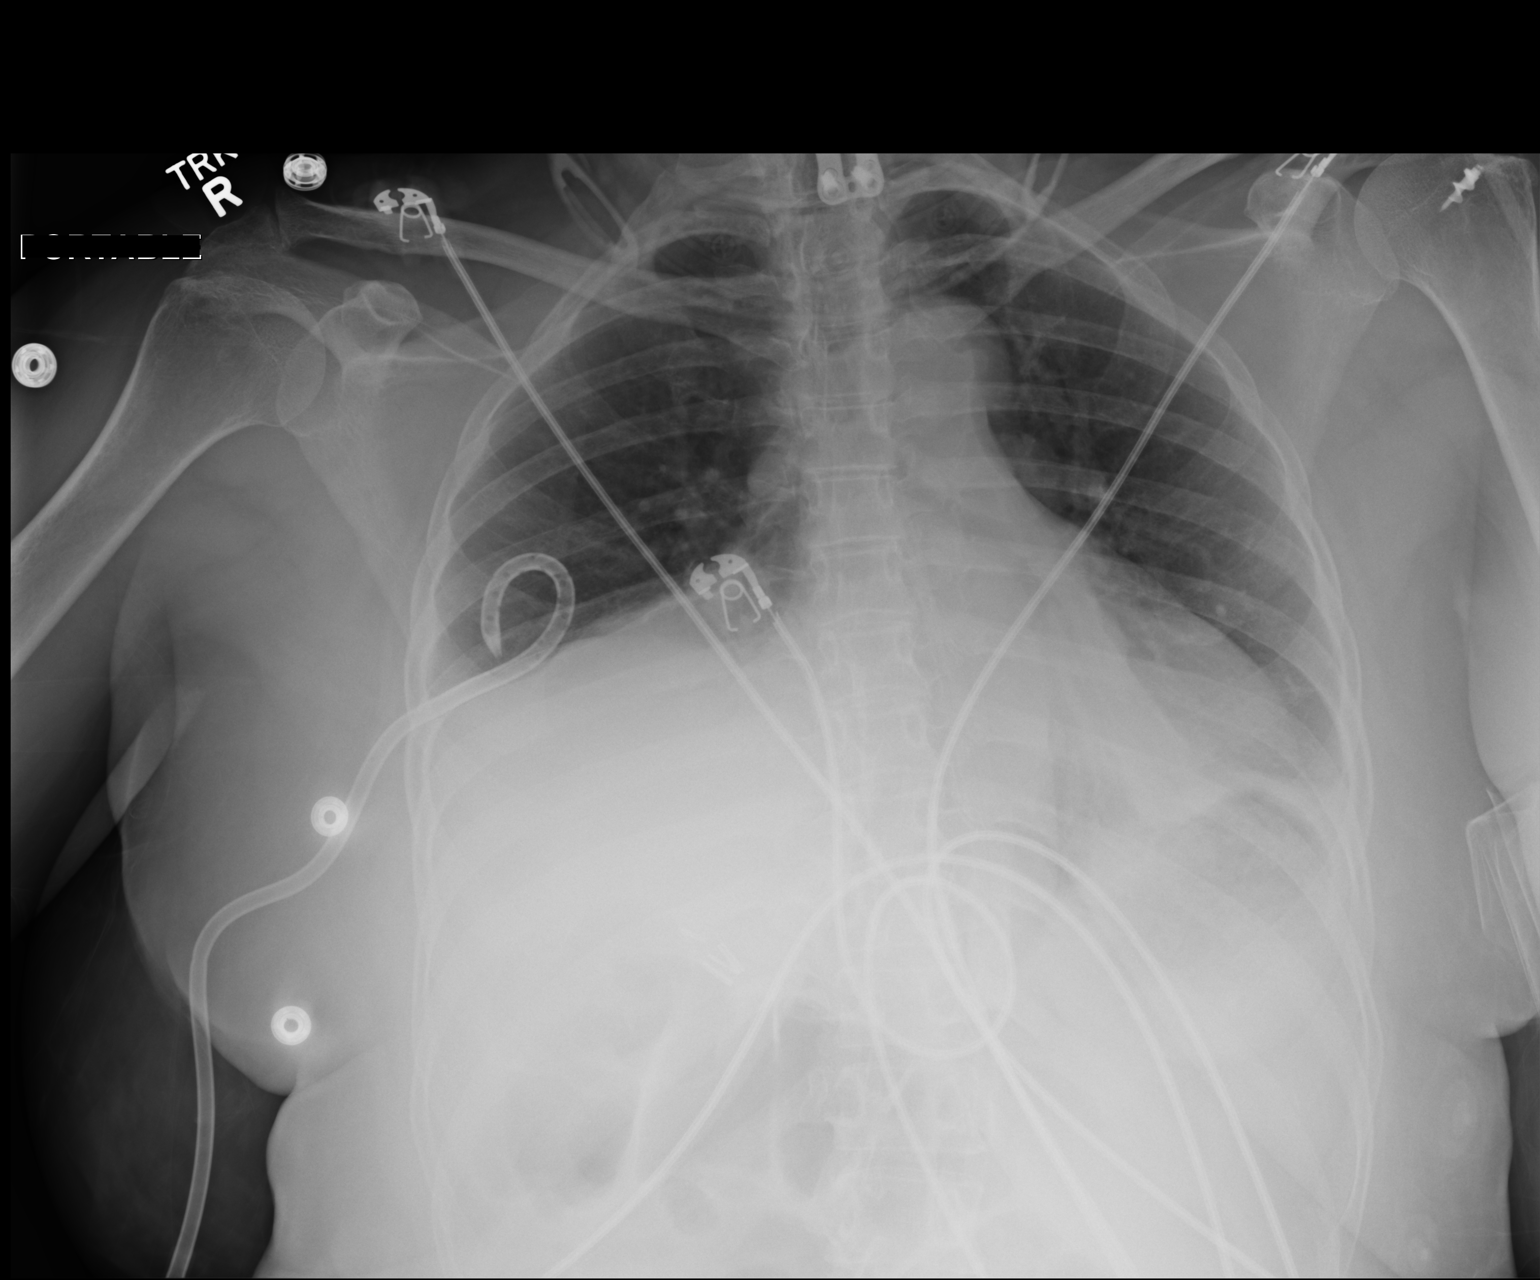

[1 of 1 positions shown; findings below may reference images not displayed]

FINDINGS: Stable cardiomediastinal silhouette. Stable position of right-sided
pigtail catheter. No pneumothorax is noted. Hypoinflation of the
lungs is noted. Left lower lobe atelectasis is noted. Stable right
basilar atelectasis is noted as well. Bony thorax is unremarkable.
IMPRESSION: Stable position of right-sided chest tube without pneumothorax.
Bibasilar subsegmental atelectasis is noted.

## 2018-11-23 IMAGING — CR DG CHEST 1V PORT
1 series · 1 of 1 positions shown · non-contrast
Comparison: Portable exam 8088 hours compared to 0598 hours

CLINICAL DATA: Chest tube removal

EXAM:
PORTABLE CHEST 1 VIEW

[AP]
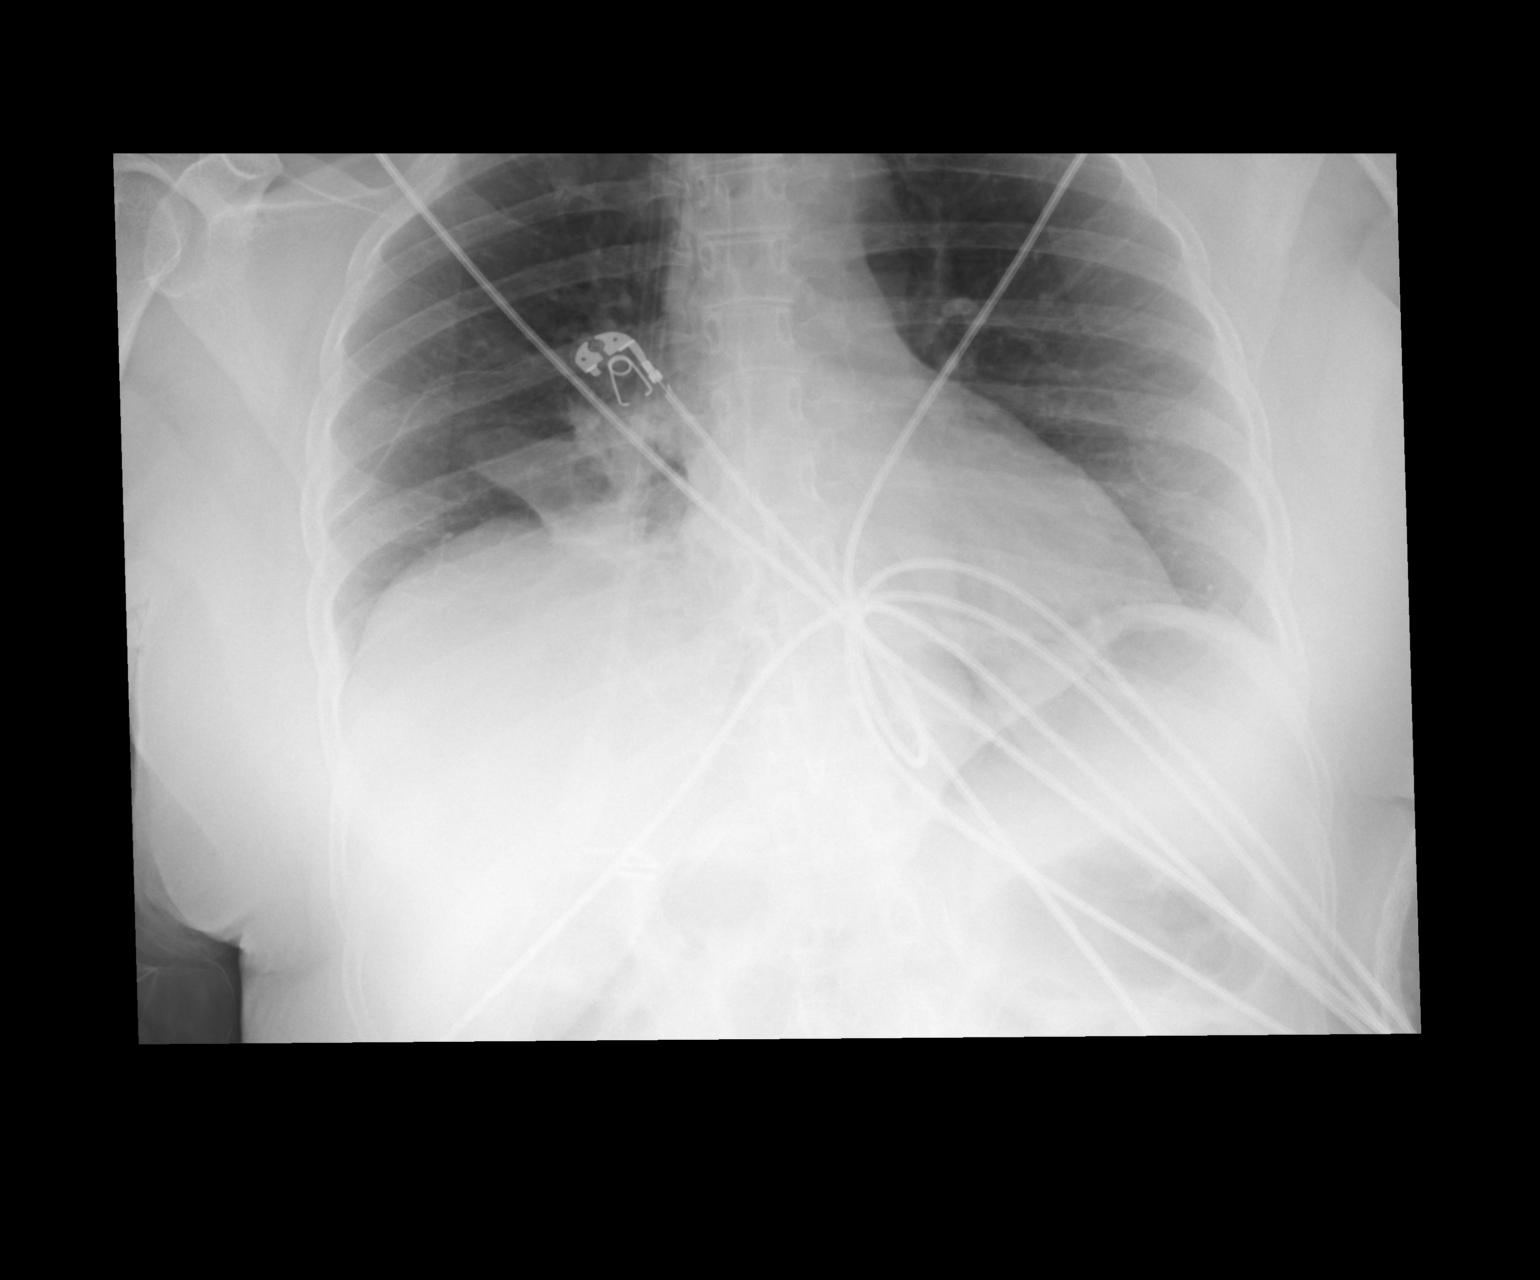

[1 of 1 positions shown; findings below may reference images not displayed]

FINDINGS: Interval removal of pigtail RIGHT thoracostomy tube.

Persistent RIGHT basilar atelectasis.

Question tiny RIGHT apex pneumothorax versus artifact from
superimposed support devices.

Heart size stable.

Lungs otherwise clear.

Prior cervicothoracic fusion.
IMPRESSION: Question tiny RIGHT apical pneumothorax post thoracostomy tube
removal.

## 2018-11-23 IMAGING — CR DG CHEST 1V PORT
1 series · 1 of 1 positions shown · non-contrast
Comparison: Chest radiograph 07/06/2016.

CLINICAL DATA: History of pneumothorax.

EXAM:
PORTABLE CHEST 1 VIEW

[AP]
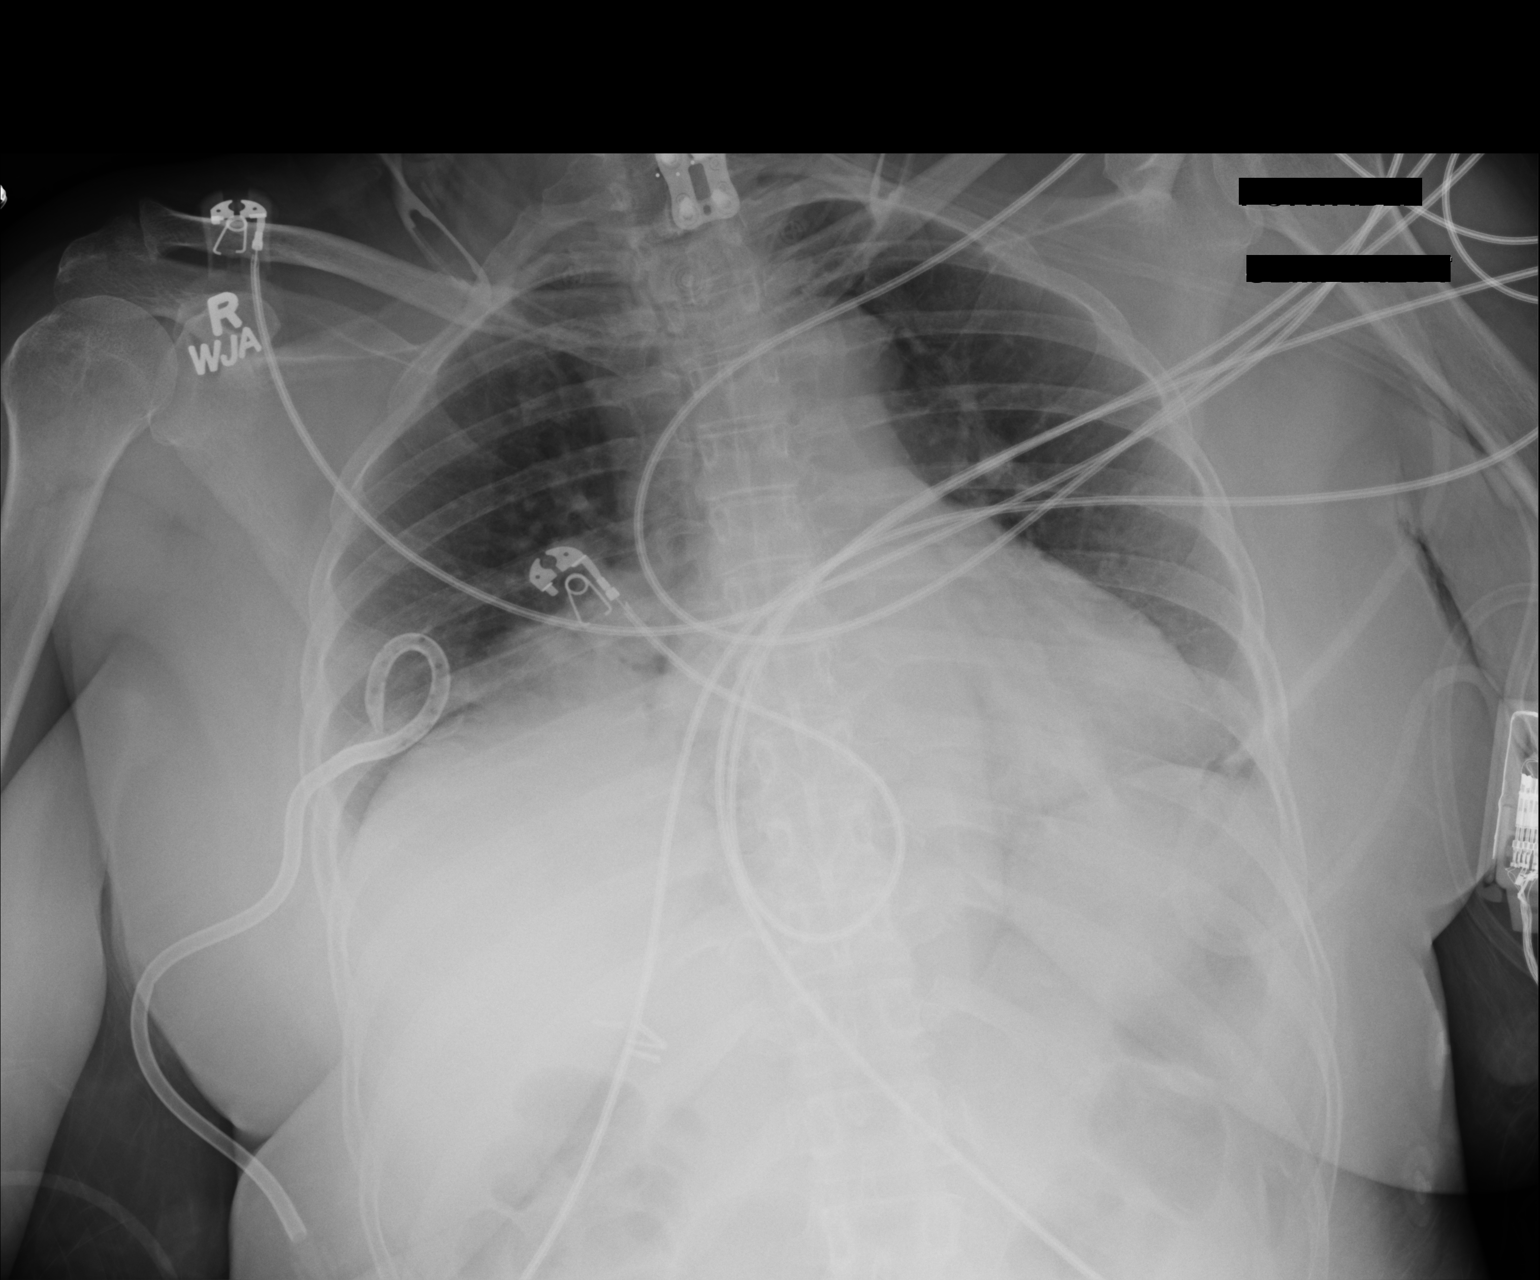

[1 of 1 positions shown; findings below may reference images not displayed]

FINDINGS: Anterior cervical spinal fusion hardware. Monitoring leads overlie
the patient. Right chest tube remains in place. Low lung volumes.
Stable cardiac and mediastinal contours. Heterogeneous opacities
right lower lung. No definite pleural effusion or pneumothorax.
IMPRESSION: Right chest tube remains in place.  No definite pneumothorax.

Persistent right hemidiaphragm elevation and suspected atelectasis.

## 2018-11-24 ENCOUNTER — Ambulatory Visit: Payer: Medicare Other

## 2018-11-24 IMAGING — CR DG CHEST 1V PORT
1 series · 1 of 1 positions shown · non-contrast
Comparison: July 07, 2016

CLINICAL DATA: History of chest tube placement.

EXAM:
PORTABLE CHEST 1 VIEW

[AP]
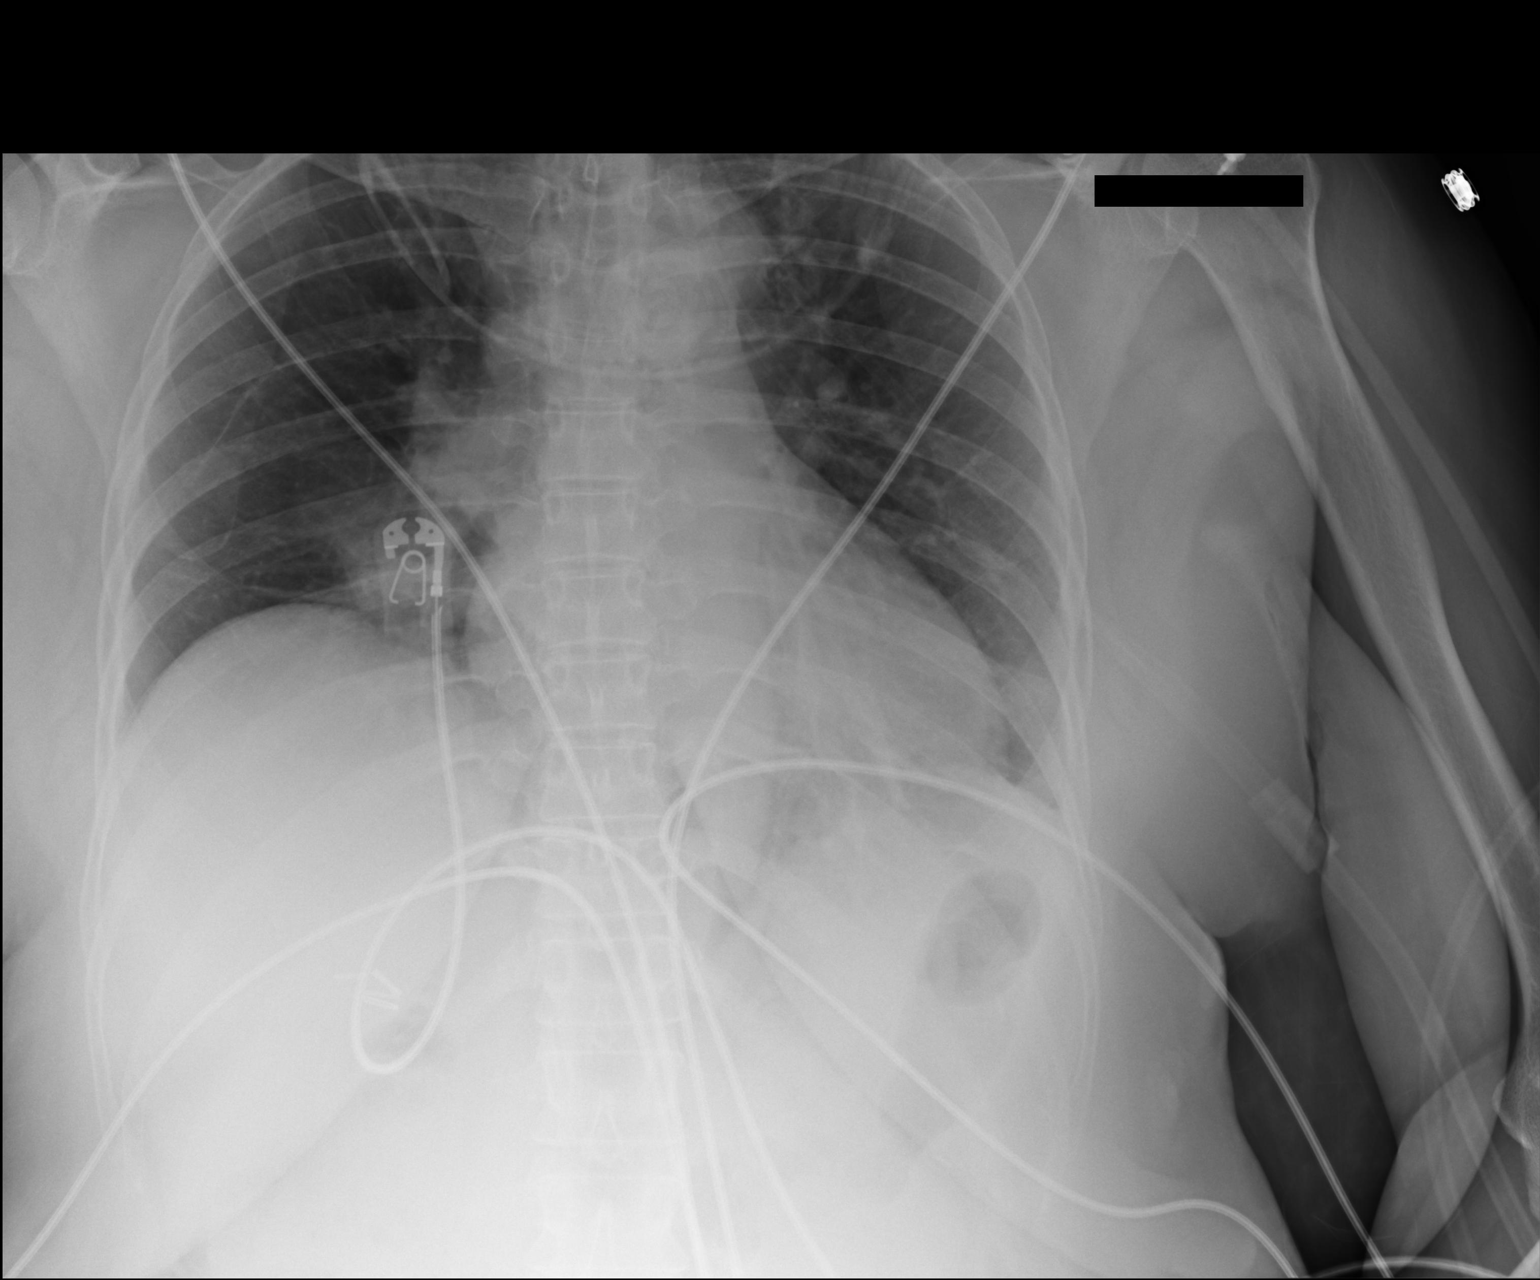

[1 of 1 positions shown; findings below may reference images not displayed]

FINDINGS: No pneumothorax. Mild atelectasis in the left base. Opacity in the
right infrahilar region persists but is smaller in the interval. No
other interval changes.
IMPRESSION: 1. No pneumothorax after right chest removal yesterday.
2. Persistent right infrahilar opacity, improved in the interval.
Recommend follow-up to complete resolution.

## 2018-12-08 DIAGNOSIS — I10 Essential (primary) hypertension: Secondary | ICD-10-CM | POA: Diagnosis not present

## 2018-12-08 DIAGNOSIS — R300 Dysuria: Secondary | ICD-10-CM | POA: Diagnosis not present

## 2018-12-15 DIAGNOSIS — R300 Dysuria: Secondary | ICD-10-CM | POA: Diagnosis not present

## 2019-01-08 DIAGNOSIS — E039 Hypothyroidism, unspecified: Secondary | ICD-10-CM | POA: Diagnosis not present

## 2019-01-08 DIAGNOSIS — H8110 Benign paroxysmal vertigo, unspecified ear: Secondary | ICD-10-CM | POA: Diagnosis not present

## 2019-01-08 DIAGNOSIS — M542 Cervicalgia: Secondary | ICD-10-CM | POA: Diagnosis not present

## 2019-01-15 DIAGNOSIS — G44229 Chronic tension-type headache, not intractable: Secondary | ICD-10-CM | POA: Diagnosis not present

## 2019-01-19 ENCOUNTER — Other Ambulatory Visit: Payer: Self-pay

## 2019-01-19 ENCOUNTER — Encounter

## 2019-01-19 ENCOUNTER — Encounter: Payer: Self-pay | Admitting: Urology

## 2019-01-19 ENCOUNTER — Ambulatory Visit: Payer: Medicare Other | Admitting: Urology

## 2019-01-19 VITALS — BP 144/91 | HR 98 | Ht 61.0 in | Wt 149.0 lb

## 2019-01-19 DIAGNOSIS — N390 Urinary tract infection, site not specified: Secondary | ICD-10-CM

## 2019-01-19 DIAGNOSIS — N200 Calculus of kidney: Secondary | ICD-10-CM | POA: Diagnosis not present

## 2019-01-19 LAB — URINALYSIS, COMPLETE
Bilirubin, UA: NEGATIVE
Glucose, UA: NEGATIVE
Ketones, UA: NEGATIVE
Leukocytes,UA: NEGATIVE
Nitrite, UA: NEGATIVE
Protein,UA: NEGATIVE
Specific Gravity, UA: 1.01 (ref 1.005–1.030)
Urobilinogen, Ur: 0.2 mg/dL (ref 0.2–1.0)
pH, UA: 7 (ref 5.0–7.5)

## 2019-01-19 LAB — MICROSCOPIC EXAMINATION
Epithelial Cells (non renal): >10 /hpf — AB (ref 0–10)
RBC, Urine: NONE SEEN /hpf (ref 0–2)

## 2019-01-19 LAB — BLADDER SCAN AMB NON-IMAGING

## 2019-01-19 MED ORDER — FLUCONAZOLE 100 MG PO TABS: 100.0000 mg | ORAL_TABLET | Freq: Every day | ORAL | 0 refills | Status: DC

## 2019-01-19 NOTE — Patient Instructions (Signed)
Vaginal Yeast infection, Adult    Vaginal yeast infection is a condition that causes vaginal discharge as well as soreness, swelling, and redness (inflammation) of the vagina. This is a common condition. Some women get this infection frequently.  What are the causes?  This condition is caused by a change in the normal balance of the yeast (candida) and bacteria that live in the vagina. This change causes an overgrowth of yeast, which causes the inflammation.  What increases the risk?  The condition is more likely to develop in women who:   Take antibiotic medicines.   Have diabetes.   Take birth control pills.   Are pregnant.   Douche often.   Have a weak body defense system (immune system).   Have been taking steroid medicines for a long time.   Frequently wear tight clothing.  What are the signs or symptoms?  Symptoms of this condition include:   White, thick, creamy vaginal discharge.   Swelling, itching, redness, and irritation of the vagina. The lips of the vagina (vulva) may be affected as well.   Pain or a burning feeling while urinating.   Pain during sex.  How is this diagnosed?  This condition is diagnosed based on:   Your medical history.   A physical exam.   A pelvic exam. Your health care provider will examine a sample of your vaginal discharge under a microscope. Your health care provider may send this sample for testing to confirm the diagnosis.  How is this treated?  This condition is treated with medicine. Medicines may be over-the-counter or prescription. You may be told to use one or more of the following:   Medicine that is taken by mouth (orally).   Medicine that is applied as a cream (topically).   Medicine that is inserted directly into the vagina (suppository).  Follow these instructions at home:    Lifestyle   Do not have sex until your health care provider approves. Tell your sex partner that you have a yeast infection. That person should go to his or her health care  provider and ask if they should also be treated.   Do not wear tight clothes, such as pantyhose or tight pants.   Wear breathable cotton underwear.  General instructions   Take or apply over-the-counter and prescription medicines only as told by your health care provider.   Eat more yogurt. This may help to keep your yeast infection from returning.   Do not use tampons until your health care provider approves.   Try taking a sitz bath to help with discomfort. This is a warm water bath that is taken while you are sitting down. The water should only come up to your hips and should cover your buttocks. Do this 3-4 times per day or as told by your health care provider.   Do not douche.   If you have diabetes, keep your blood sugar levels under control.   Keep all follow-up visits as told by your health care provider. This is important.  Contact a health care provider if:   You have a fever.   Your symptoms go away and then return.   Your symptoms do not get better with treatment.   Your symptoms get worse.   You have new symptoms.   You develop blisters in or around your vagina.   You have blood coming from your vagina and it is not your menstrual period.   You develop pain in your abdomen.  Summary     Vaginal yeast infection is a condition that causes discharge as well as soreness, swelling, and redness (inflammation) of the vagina.   This condition is treated with medicine. Medicines may be over-the-counter or prescription.   Take or apply over-the-counter and prescription medicines only as told by your health care provider.   Do not douche. Do not have sex or use tampons until your health care provider approves.   Contact a health care provider if your symptoms do not get better with treatment or your symptoms go away and then return.  This information is not intended to replace advice given to you by your health care provider. Make sure you discuss any questions you have with your health care  provider.  Document Released: 04/25/2005 Document Revised: 12/02/2017 Document Reviewed: 12/02/2017  Elsevier Interactive Patient Education  2019 [employer]

## 2019-01-19 NOTE — Progress Notes (Signed)
   01/19/2019 10:21 AM   Kimberly [name] [birth year] [id number]  Reason for visit: Pelvic pain/itching  HPI: I saw [name] in clinic today for pelvic pain and itching.  She reports a week of pelvic pain and itching in the vaginal area.  Her urinalysis today is concerning for yeast infection.  She reports this feels similar to prior yeast infections, but she has not had them for a long time.  Her history is also notable for left shockwave lithotripsy on 09/04/2018, with excellent fragmentation of stone post procedure KUB.  I initially met her in January 2020 for evaluation of microscopic hematuria.  CT urogram was benign aside from previously mentioned stone, however she did not yet complete cystoscopy to complete hematuria work-up.   ROS: Please see flowsheet from today's date for complete review of systems.  Physical Exam: BP (!) 144/91 (BP Location: Left Arm, Patient Position: Sitting)   Pulse 98   Ht _0  (1.549 m)   Wt 149 lb (67.6 kg)   BMI 28.15 kg/m    Laboratory Data: Urinalysis today 0-5 WBCs, 0 RBCs, moderate bacteria, yeast present, nitrite negative We will send for culture   Assessment & Plan:   58 year old female who presents with vaginal pain and itching and urinalysis concerning for yeast infection.  She also has a history of microscopic hematuria, and has not yet completed clinic cystoscopy for work-up.  Fluconazole x3 days for yeast infection RTC for next cystoscopy to complete microscopic hematuria work-up  A total of 10 minutes were spent face-to-face with the patient, greater than 50% was spent in patient education, counseling, and coordination of care regarding need for cystoscopy to complete microscopic hematuria work-up, and yeast infection.   Billey Co, [facility] [address], [place], [place] [address] [phone]

## 2019-01-21 LAB — CULTURE, URINE COMPREHENSIVE

## 2019-01-28 ENCOUNTER — Encounter: Payer: Self-pay | Admitting: Urology

## 2019-01-28 ENCOUNTER — Ambulatory Visit (INDEPENDENT_AMBULATORY_CARE_PROVIDER_SITE_OTHER): Payer: Medicare Other | Admitting: Urology

## 2019-01-28 ENCOUNTER — Other Ambulatory Visit: Payer: Self-pay

## 2019-01-28 VITALS — BP 126/88 | HR 102 | Ht 61.0 in | Wt 150.0 lb

## 2019-01-28 DIAGNOSIS — N200 Calculus of kidney: Secondary | ICD-10-CM | POA: Diagnosis not present

## 2019-01-28 NOTE — Progress Notes (Signed)
Cystoscopy Procedure Note:  Indication: Microscopic hematuria (10-50 RBCs)  After informed consent and discussion of the procedure and its risks, PINKEY MCJUNKIN was positioned and prepped in the standard fashion. Cystoscopy was performed with a flexible cystoscope. The urethra, bladder neck and entire bladder was visualized in a standard fashion. The ureteral orifices were visualized in their normal location and orientation. Mucosa grossly normal throughout.  Imaging: CTU from 08/04/2018 with no hydro, renal masses, or filling defects. 9mm left lower pole stone was treated with SWL on 09/04/18 with excellent fragmentation  Findings: Normal cysto  Assessment and Plan: RTC one year with KUB for stone surveillance  Nickolas Madrid, MD 01/28/2019

## 2019-01-28 NOTE — Patient Instructions (Signed)
Dietary Guidelines to Help Prevent Kidney Stones Kidney stones are deposits of minerals and salts that form inside your kidneys. Your risk of developing kidney stones may be greater depending on your diet, your lifestyle, the medicines you take, and whether you have certain medical conditions. Most people can reduce their chances of developing kidney stones by following the instructions below. Depending on your overall health and the type of kidney stones you tend to develop, your dietitian may give you more specific instructions. What are tips for following this plan? Reading food labels  Choose foods with "no salt added" or "low-salt" labels. Limit your sodium intake to less than 1500 mg per day.  Choose foods with calcium for each meal and snack. Try to eat about 300 mg of calcium at each meal. Foods that contain 200-500 mg of calcium per serving include: ? 8 oz (237 ml) of milk, fortified nondairy milk, and fortified fruit juice. ? 8 oz (237 ml) of kefir, yogurt, and soy yogurt. ? 4 oz (118 ml) of tofu. ? 1 oz of cheese. ? 1 cup (300 g) of dried figs. ? 1 cup (91 g) of cooked broccoli. ? 1-3 oz can of sardines or mackerel.  Most people need 1000 to 1500 mg of calcium each day. Talk to your dietitian about how much calcium is recommended for you. Shopping  Buy plenty of fresh fruits and vegetables. Most people do not need to avoid fruits and vegetables, even if they contain nutrients that may contribute to kidney stones.  When shopping for convenience foods, choose: ? Whole pieces of fruit. ? Premade salads with dressing on the side. ? Low-fat fruit and yogurt smoothies.  Avoid buying frozen meals or prepared deli foods.  Look for foods with live cultures, such as yogurt and kefir. Cooking  Do not add salt to food when cooking. Place a salt shaker on the table and allow each person to add his or her own salt to taste.  Use vegetable protein, such as beans, textured vegetable  protein (TVP), or tofu instead of meat in pasta, casseroles, and soups. Meal planning   Eat less salt, if told by your dietitian. To do this: ? Avoid eating processed or premade food. ? Avoid eating fast food.  Eat less animal protein, including cheese, meat, poultry, or fish, if told by your dietitian. To do this: ? Limit the number of times you have meat, poultry, fish, or cheese each week. Eat a diet free of meat at least 2 days a week. ? Eat only one serving each day of meat, poultry, fish, or seafood. ? When you prepare animal protein, cut pieces into small portion sizes. For most meat and fish, one serving is about the size of one deck of cards.  Eat at least 5 servings of fresh fruits and vegetables each day. To do this: ? Keep fruits and vegetables on hand for snacks. ? Eat 1 piece of fruit or a handful of berries with breakfast. ? Have a salad and fruit at lunch. ? Have two kinds of vegetables at dinner.  Limit foods that are high in a substance called oxalate. These include: ? Spinach. ? Rhubarb. ? Beets. ? Potato chips and french fries. ? Nuts.  If you regularly take a diuretic medicine, make sure to eat at least 1-2 fruits or vegetables high in potassium each day. These include: ? Avocado. ? Banana. ? Orange, prune, carrot, or tomato juice. ? Baked potato. ? Cabbage. ? Beans and split   peas. General instructions   Drink enough fluid to keep your urine clear or pale yellow. This is the most important thing you can do.  Talk to your health care provider and dietitian about taking daily supplements. Depending on your health and the cause of your kidney stones, you may be advised: ? Not to take supplements with vitamin C. ? To take a calcium supplement. ? To take a daily probiotic supplement. ? To take other supplements such as magnesium, fish oil, or vitamin B6.  Take all medicines and supplements as told by your health care provider.  Limit alcohol intake to no  more than 1 drink a day for nonpregnant women and 2 drinks a day for men. One drink equals 12 oz of beer, 5 oz of wine, or 1 oz of hard liquor.  Lose weight if told by your health care provider. Work with your dietitian to find strategies and an eating plan that works best for you. What foods are not recommended? Limit your intake of the following foods, or as told by your dietitian. Talk to your dietitian about specific foods you should avoid based on the type of kidney stones and your overall health. Grains Breads. Bagels. Rolls. Baked goods. Salted crackers. Cereal. Pasta. Vegetables Spinach. Rhubarb. Beets. Canned vegetables. Angie Fava. Olives. Meats and other protein foods Nuts. Nut butters. Large portions of meat, poultry, or fish. Salted or cured meats. Deli meats. Hot dogs. Sausages. Dairy Cheese. Beverages Regular soft drinks. Regular vegetable juice. Seasonings and other foods Seasoning blends with salt. Salad dressings. Canned soups. Soy sauce. Ketchup. Barbecue sauce. Canned pasta sauce. Casseroles. Pizza. Lasagna. Frozen meals. Potato chips. [place] fries. Summary  You can reduce your risk of kidney stones by making changes to your diet.  The most important thing you can do is drink enough fluid. You should drink enough fluid to keep your urine clear or pale yellow.  Ask your health care provider or dietitian how much protein from animal sources you should eat each day, and also how much salt and calcium you should have each day. This information is not intended to replace advice given to you by your health care provider. Make sure you discuss any questions you have with your health care provider. Document Released: 11/10/2010 Document Revised: 11/05/2018 Document Reviewed: 06/26/2016 Elsevier Patient Education  2020 [employer].

## 2019-01-29 IMAGING — MR MR HEAD WO/W CM
9 of 12 series · 31 of 48 positions shown · IV contrast (multihance)
Comparison: The

CLINICAL DATA: Frequent headaches

EXAM:
MRI HEAD WITHOUT AND WITH CONTRAST
TECHNIQUE: Multiplanar, multiecho pulse sequences of the brain and surrounding
structures were obtained without and with intravenous contrast.
CONTRAST:  14mL MULTIHANCE GADOBENATE DIMEGLUMINE 529 MG/ML IV SOLN

[Series 2: T1 · sagittal · 5.0mm · 0.45mm/px · 1 of 27 slices shown]
[im 1/27]
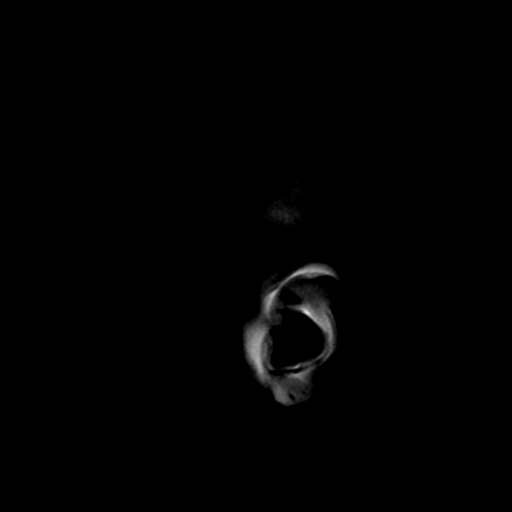

[Series 4: DWI · axial · 4.0mm · 0.94mm/px · z∈[-74,+89]mm · 5 of 42 slices shown (1 of 2)]
[im 1/42]
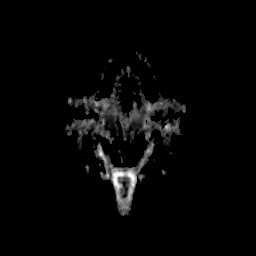
[im 11/42]
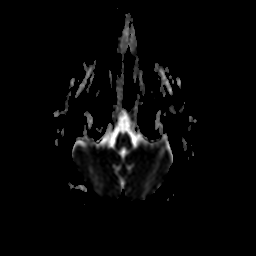
[im 21/42]
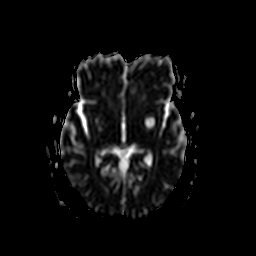
[im 31/42]
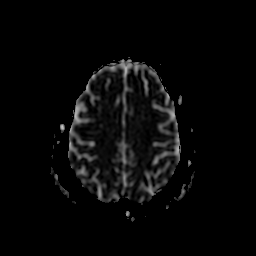
[im 42/42]
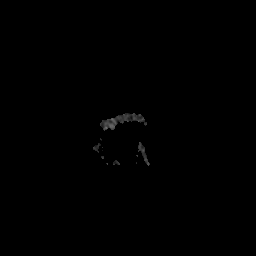

[Series 6: DWI · coronal · 5.0mm · 1.80mm/px · 4 of 37 slices shown (2 of 2)]
[im 1/37]
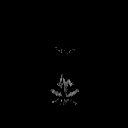
[im 13/37]
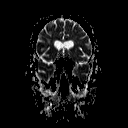
[im 25/37]
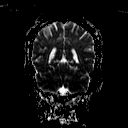
[im 37/37]
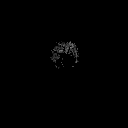

[Series 9: T2 · axial · 5.0mm · 0.45mm/px · z∈[-67,+95]mm · 3 of 26 slices shown (1 of 2)]
[im 1/26]
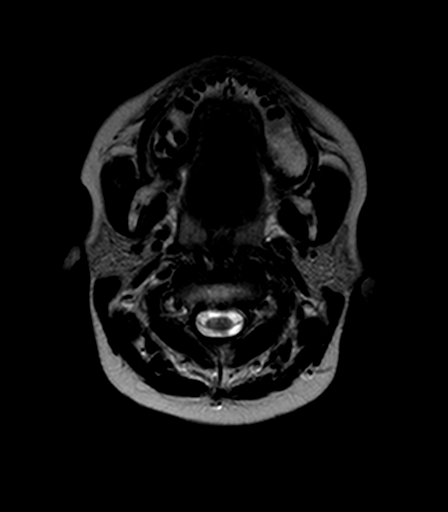
[im 13/26]
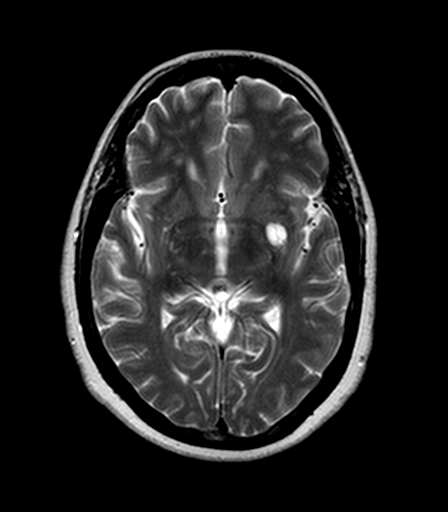
[im 26/26]
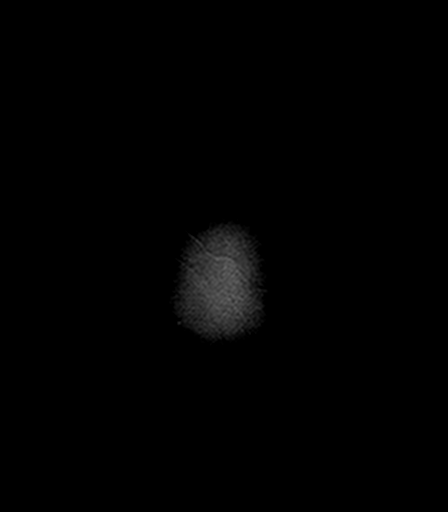

[Series 10: FLAIR · axial · 5.0mm · 0.90mm/px · z∈[-67,+95]mm · 3 of 26 slices shown]
[im 1/26]
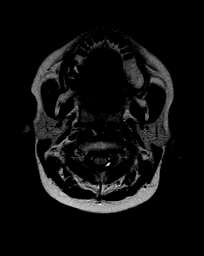
[im 13/26]
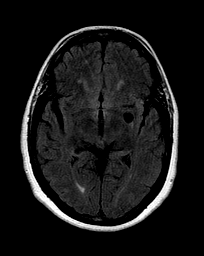
[im 26/26]
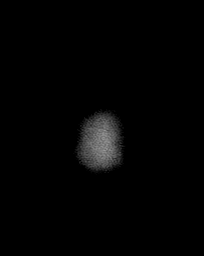

[Series 11: T2 · axial · 5.0mm · 0.45mm/px · z∈[-67,+95]mm · 3 of 26 slices shown (2 of 2)]
[im 1/26]
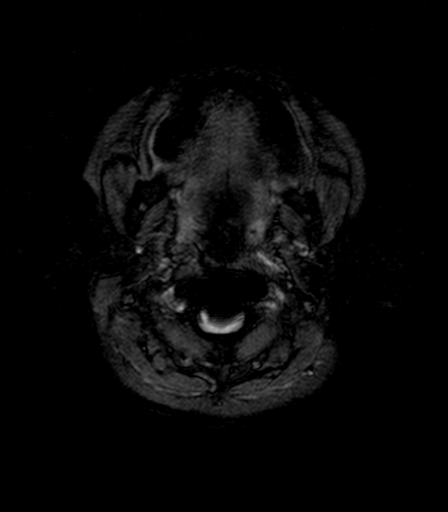
[im 13/26]
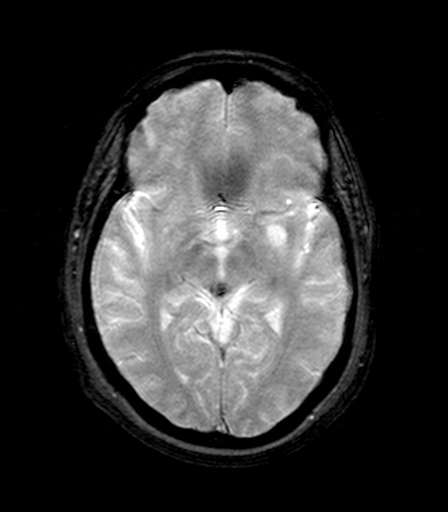
[im 26/26]
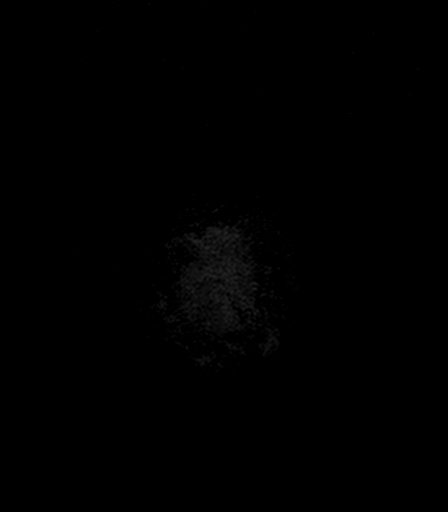

[Series 13: T2 post-contrast · coronal · 5.0mm · 0.45mm/px · 3 of 28 slices shown]
[im 1/28]
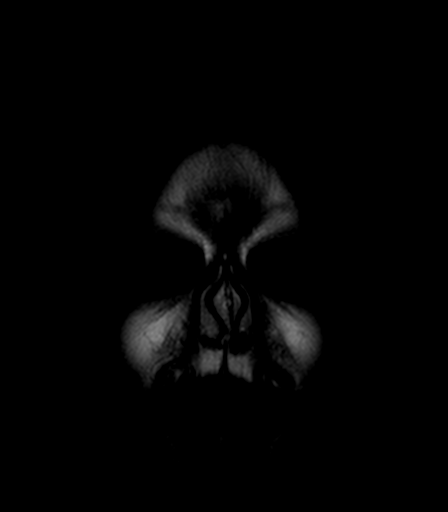
[im 14/28]
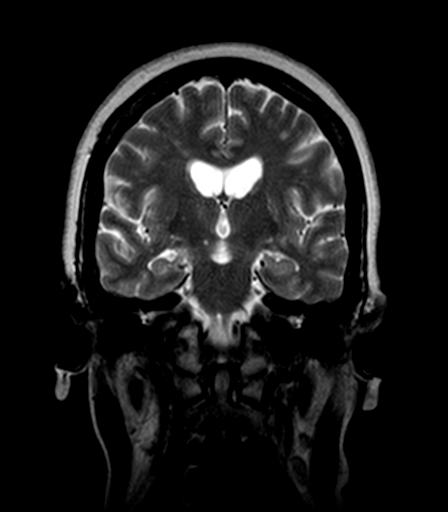
[im 28/28]
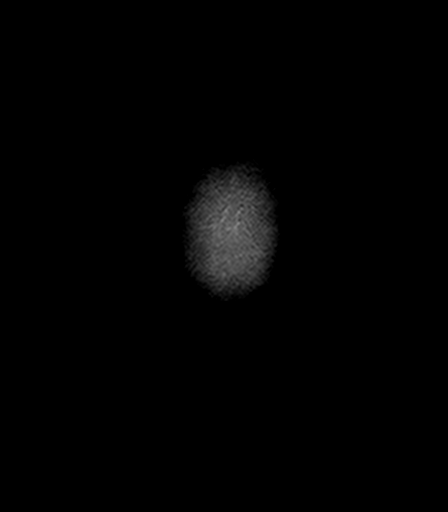

[Series 14: T1 post-contrast · axial · 3.0mm · 0.45mm/px · z∈[-62,+90]mm · 6 of 52 slices shown (1 of 2)]
[im 1/52]
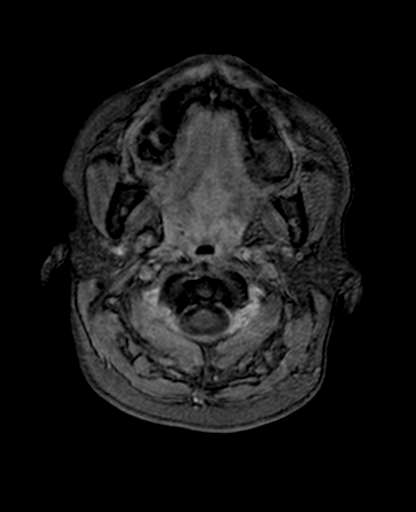
[im 11/52]
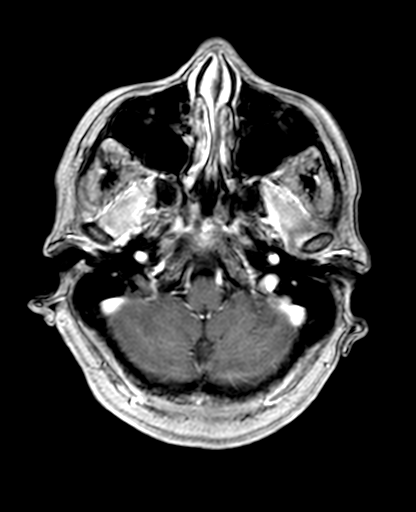
[im 21/52]
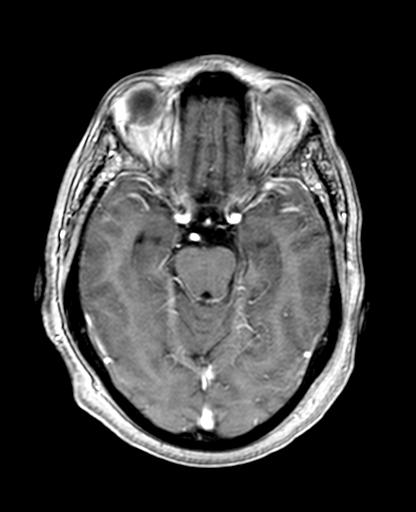
[im 31/52]
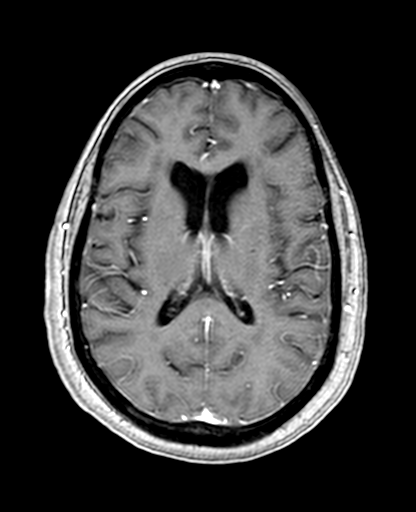
[im 41/52]
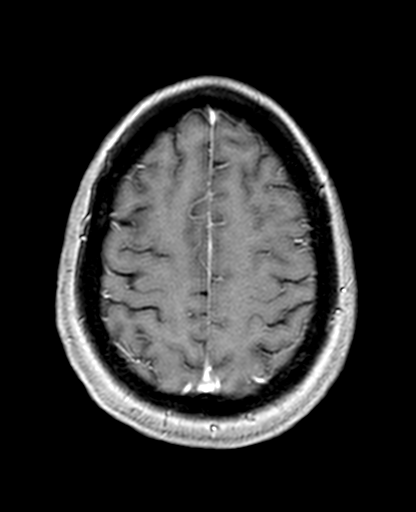
[im 52/52]
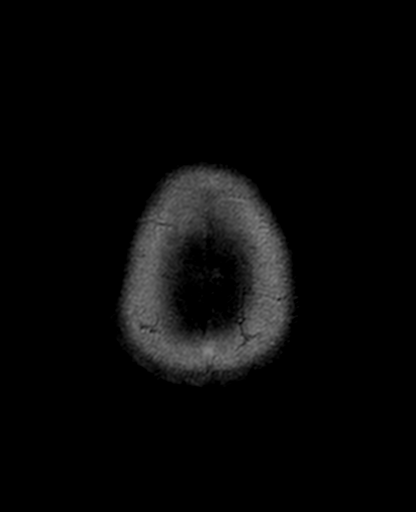

[Series 15: T1 post-contrast · coronal · 5.0mm · 0.45mm/px · 3 of 28 slices shown (2 of 2)]
[im 1/28]
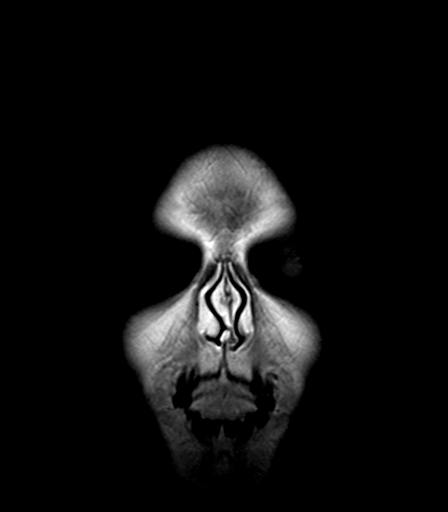
[im 14/28]
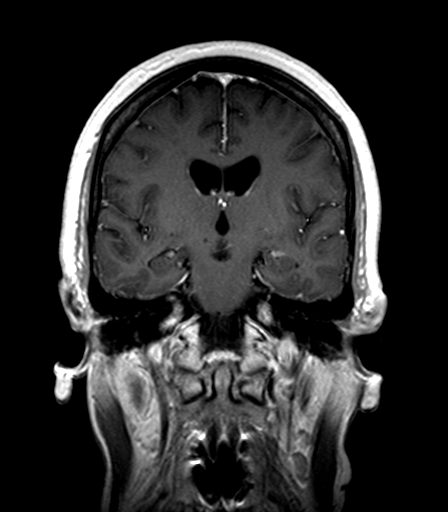
[im 28/28]
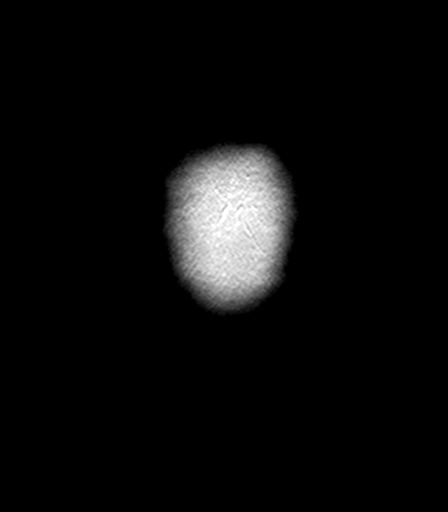

[31 of 48 positions shown; findings below may reference images not displayed]

FINDINGS: Brain: No focal diffusion restriction to indicate acute infarct. No
intraparenchymal hemorrhage. There is a large perivascular space at
the left lentiform nucleus. There is multifocal hyperintense
T2-weighted signal within the periventricular white matter, most
often seen in the setting of chronic microvascular ischemia. No mass
lesion or midline shift. No hydrocephalus or extra-axial fluid
collection. The midline structures are normal. Mildly age advanced
atrophy without lobar predominance.

Vascular: Major intracranial arterial and venous sinus flow voids
are preserved. No evidence of chronic microhemorrhage or amyloid
angiopathy.

Skull and upper cervical spine: The visualized skull base,
calvarium, upper cervical spine and extracranial soft tissues are
normal.

Sinuses/Orbits: No fluid levels or advanced mucosal thickening. No
mastoid effusion. Normal orbits.
IMPRESSION: 1. Multifocal white matter hyperintensity, most suggestive of
advanced for age chronic microvascular ischemia. The pattern is not
suggestive of demyelinating disease.
2. Mildly age advanced cerebral atrophy without lobar predominance.
3. No acute intracranial abnormality.

## 2019-02-02 DIAGNOSIS — M542 Cervicalgia: Secondary | ICD-10-CM | POA: Diagnosis not present

## 2019-02-02 DIAGNOSIS — R413 Other amnesia: Secondary | ICD-10-CM | POA: Diagnosis not present

## 2019-02-02 DIAGNOSIS — R42 Dizziness and giddiness: Secondary | ICD-10-CM | POA: Diagnosis not present

## 2019-02-02 DIAGNOSIS — R51 Headache: Secondary | ICD-10-CM | POA: Diagnosis not present

## 2019-02-03 ENCOUNTER — Other Ambulatory Visit: Payer: Self-pay

## 2019-02-03 ENCOUNTER — Encounter: Payer: Self-pay | Admitting: Student in an Organized Health Care Education/Training Program

## 2019-02-03 ENCOUNTER — Ambulatory Visit
Payer: Medicare Other | Attending: Nurse Practitioner | Admitting: Student in an Organized Health Care Education/Training Program

## 2019-02-03 VITALS — BP 131/88 | HR 103 | Temp 98.1°F | Ht 61.0 in | Wt 149.0 lb

## 2019-02-03 DIAGNOSIS — M542 Cervicalgia: Secondary | ICD-10-CM

## 2019-02-03 DIAGNOSIS — Q761 Klippel-Feil syndrome: Secondary | ICD-10-CM

## 2019-02-03 DIAGNOSIS — R51 Headache: Secondary | ICD-10-CM | POA: Diagnosis not present

## 2019-02-03 DIAGNOSIS — M5481 Occipital neuralgia: Secondary | ICD-10-CM | POA: Diagnosis not present

## 2019-02-03 DIAGNOSIS — G894 Chronic pain syndrome: Secondary | ICD-10-CM | POA: Diagnosis not present

## 2019-02-03 DIAGNOSIS — M5030 Other cervical disc degeneration, unspecified cervical region: Secondary | ICD-10-CM | POA: Diagnosis not present

## 2019-02-03 DIAGNOSIS — M47812 Spondylosis without myelopathy or radiculopathy, cervical region: Secondary | ICD-10-CM

## 2019-02-03 DIAGNOSIS — R519 Headache, unspecified: Secondary | ICD-10-CM

## 2019-02-03 DIAGNOSIS — Z9889 Other specified postprocedural states: Secondary | ICD-10-CM | POA: Diagnosis not present

## 2019-02-03 MED ORDER — OXYCODONE-ACETAMINOPHEN 10-325 MG PO TABS: 1.0000 | ORAL_TABLET | Freq: Two times a day (BID) | ORAL | 0 refills | Status: DC | PRN

## 2019-02-03 MED ORDER — GABAPENTIN 300 MG PO CAPS: 300.0000 mg | ORAL_CAPSULE | Freq: Two times a day (BID) | ORAL | 2 refills | Status: DC

## 2019-02-03 NOTE — Progress Notes (Signed)
Patient's Name: Kimberly [name]  MRN: [id number]  Referring Provider: Glendon Axe, MD  DOB: 03/26/1961  PCP: Dion Body, MD  DOS: 02/03/2019  Note by: Gillis Santa, MD  Service setting: Ambulatory outpatient  Attending: Gillis Santa, MD  Location: [facility] (AMB) Pain Management Facility  Specialty: Interventional Pain Management  Patient type: Established   Primary Reason(s) for Visit: Encounter for prescription drug management. (Level of risk: moderate)  CC: Headache  HPI  Kimberly [name] is a 59 y.o. year old, female patient, who comes today for a medication management evaluation. She has SHOULDER PAIN; IMPINGEMENT SYNDROME; RUPTURE ROTATOR CUFF; HTN (hypertension); S/P carpal tunnel release; CTS (carpal tunnel syndrome); Cervical spondylosis without myelopathy; Muscle weakness (generalized); Tight fascia; Decreased range of motion of shoulder; Cervical stenosis of spinal canal; Asthma; Acute chest wall pain; GERD (gastroesophageal reflux disease); Spontaneous pneumothorax; Pseudoarthrosis of cervical spine ([place]); Bilateral hand pain; Degenerative disc disease, cervical; Dizziness; Dysphagia; History of recent fall; Hypothyroidism (acquired); Loss of memory; Medication overuse headache; Chronic tension-type headache, not intractable; Numbness and tingling; Reaction to severe stress; Vasovagal syncope; Vitamin D deficiency; Cervical fusion syndrome; Hx of cervical spine surgery; Chronic pain syndrome; Cervicalgia; Controlled substance agreement signed; Depression, major, recurrent, mild ([place]); Depression, major, single episode, complete remission ([place]); Pelvic pain in female; Poorly-controlled hypertension; and Recurrent herpes simplex on their problem list. Her primarily concern today is the Headache  Pain Assessment: Location: Mid, Lower, Medial Head Radiating: pain radiaties down head to neck, shoulder to lower back Onset: More than a month ago Duration: Chronic pain Quality: Aching,  Spasm Severity: 4 /10 (subjective, self-reported pain score)  Note: Reported level is compatible with observation.                         When using our objective Pain Scale, levels between 6 and 10/10 are said to belong in an emergency room, as it progressively worsens from a 6/10, described as severely limiting, requiring emergency care not usually available at an outpatient pain management facility. At a 6/10 level, communication becomes difficult and requires great effort. Assistance to reach the emergency department may be required. Facial flushing and profuse sweating along with potentially dangerous increases in heart rate and blood pressure will be evident. Effect on ADL: limits my daily activities Timing: Constant Modifying factors: medication and lay down BP: 131/88  HR: (!) 103  Kimberly [name] was last scheduled for an appointment on Visit date not found for medication management. During today's appointment we reviewed Kimberly [name]'s chronic pain status, as well as her outpatient medication regimen.  Patient follows up today for medication management.  She states that she was recently started on gabapentin 100 mg twice daily by her neurologist and instructed to increase to 200 mg twice daily.  She states that this medication is providing mild benefit.  She has only been on it for a couple of days.  We discussed decreasing her oxycodone intake to 1 to 1.5 tablet daily.  Patient is okay with this.  We will reduce her monthly quantity to 45.  I will also have her increase her gabapentin to 300 mg twice a day in 1 month so long as she does not have any side effects with 200 mg twice daily.  The patient  reports no history of drug use. Her body mass index is 28.15 kg/m.  Further details on both, my assessment(s), as well as the proposed treatment plan, please see below.  Controlled Substance Pharmacotherapy  Assessment REMS (Risk Evaluation and Mitigation Strategy)  Analgesic:   01/08/2019  3    11/04/2018  Oxycodone-Acetaminophen 10-325  60.00 30 Cr Kin   [id number]   [facility] (1123)   0  30.00 MME  Medicare   [place]   }   Kimberly Fischer, RN  02/03/2019 10:46 AM  Sign when Signing Visit Nursing Pain Medication Assessment:  Safety precautions to be maintained throughout the outpatient stay will include: orient to surroundings, keep bed in low position, maintain call bell within reach at all times, provide assistance with transfer out of bed and ambulation.  Medication Inspection Compliance: Pill count conducted under aseptic conditions, in front of the patient. Neither the pills nor the bottle was removed from the patient's sight at any time. Once count was completed pills were immediately returned to the patient in their original bottle.  Medication: Oxycodone/APAP Pill/Patch Count: 48 of 60 pills remain Pill/Patch Appearance: Markings consistent with prescribed medication Bottle Appearance: Standard pharmacy container. Clearly labeled. Filled Date: 6 / 59 / 2020 Last Medication intake:  01/15/19   Pharmacokinetics: Liberation and absorption (onset of action): WNL Distribution (time to peak effect): WNL Metabolism and excretion (duration of action): WNL         Pharmacodynamics: Desired effects: Analgesia: [name] reports >50% benefit. Functional ability: Patient reports that medication allows her to accomplish basic ADLs Clinically meaningful improvement in function (CMIF): Sustained CMIF goals met Perceived effectiveness: Described as relatively effective, allowing for increase in activities of daily living (ADL) Undesirable effects: Side-effects or Adverse reactions: None reported Monitoring: [place] PMP: PDMP reviewed during this encounter. Online review of the past 59-monthperiod conducted. Compliant with practice rules and regulations Last UDS on record: Summary  Date Value Ref Range Status  03/24/2018 FINAL  Final    Comment:     ==================================================================== TOXAS[facility]RE COMP DRUG ANALYSIS,UR ==================================================================== Test                             Result       Flag       Units Drug Present not Declared for Prescription Verification   Naproxen                       PRESENT      UNEXPECTED Drug Absent but Declared for Prescription Verification   Diazepam                       Not Detected UNEXPECTED ng/mg creat   Alprazolam                     Not Detected UNEXPECTED ng/mg creat   Oxycodone                      Not Detected UNEXPECTED ng/mg creat   Tramadol                       Not Detected UNEXPECTED ng/mg creat   Gabapentin                     Not Detected UNEXPECTED   Baclofen                       Not Detected UNEXPECTED   Cyclobenzaprine  Not Detected UNEXPECTED   Methocarbamol                  Not Detected UNEXPECTED   Citalopram                     Not Detected UNEXPECTED   Duloxetine                     Not Detected UNEXPECTED   Nortriptyline                  Not Detected UNEXPECTED   Acetaminophen                  Not Detected UNEXPECTED    Acetaminophen, as indicated in the declared medication list, is    not always detected even when used as directed.   Ibuprofen                      Not Detected UNEXPECTED    Ibuprofen, as indicated in the declared medication list, is not    always detected even when used as directed. ==================================================================== Test                      Result    Flag   Units      Ref Range   Creatinine              255              mg/dL      >=20 ==================================================================== Declared Medications:  The flagging and interpretation on this report are based on the  following declared medications.  Unexpected results may arise from  inaccuracies in the declared medications.  **Note: The testing scope of this  panel includes these medications:  Alprazolam (Xanax)  Baclofen  Cyclobenzaprine (Flexeril)  Diazepam (Valium)  Duloxetine (Cymbalta)  Escitalopram (Lexapro)  Gabapentin (Neurontin)  Methocarbamol (Robaxin)  Nortriptyline (Pamelor)  Oxycodone  Oxycodone (Percocet)  Tramadol  **Note: The testing scope of this panel does not include small to  moderate amounts of these reported medications:  Acetaminophen (Percocet)  Acetaminophen (Tylenol)  Ibuprofen  Ibuprofen (Advil)  **Note: The testing scope of this panel does not include following  reported medications:  Acyclovir (Zovirax)  Albuterol (Proventil)  Amlodipine (Norvasc)  Cholecalciferol  Clotrimazole (Lotrimin)  Estradiol (Estrace)  LIOTHYRONINE (Cytomel)  Meclizine (Antivert)  Nystatin (Mycostatin)  Oxybutynin (Ditropan)  Pantoprazole (Protonix)  Tetrahydrozoline ==================================================================== For clinical consultation, please call [phone]. ====================================================================    UDS interpretation: Compliant          Medication Assessment Form: Reviewed. Patient indicates being compliant with therapy Treatment compliance: Compliant Risk Assessment Profile: Aberrant behavior: See initial evaluations. None observed or detected today Comorbid factors increasing risk of overdose: See initial evaluation. No additional risks detected today Opioid risk tool (ORT):  Opioid Risk  02/03/2019  Alcohol 0  Illegal Drugs 0  Rx Drugs 0  Alcohol 0  Illegal Drugs 0  Rx Drugs 0  Age between 16-45 years  0  History of Preadolescent Sexual Abuse 0  Psychological Disease 0  ADD -  OCD -  Bipolar -  Depression 1  Opioid Risk Tool Scoring 1  Opioid Risk Interpretation Low Risk    ORT Scoring interpretation table:  Score <3 = Low Risk for SUD  Score between 4-7 = Moderate Risk for SUD  Score >8 = High Risk for Opioid Abuse  Risk of substance use  disorder (SUD): Low  Risk Mitigation Strategies:  Patient Counseling: Covered Patient-Prescriber Agreement (PPA): Present and active  Notification to other healthcare providers: Done  Pharmacologic Plan: Quantity reduction from 60/month to 45/month as we attempt to increase gabapentin dose.             Laboratory Chemistry   SAFETY SCREENING Profile Lab Results  Component Value Date   STAPHAUREUS NEGATIVE 11/06/2016   MRSAPCR NEGATIVE 11/06/2016   HIV NONREACTIVE 12/18/2013   Renal Function Markers Lab Results  Component Value Date   BUN 11 07/15/2017   CREATININE 0.60 08/04/2018   GFRAA >60 07/15/2017   GFRNONAA >60 07/15/2017                             Hepatic Function Markers Lab Results  Component Value Date   AST 26 07/31/2015   ALT 17 07/31/2015   ALBUMIN 4.1 07/31/2015   ALKPHOS 93 07/31/2015   LIPASE 23 07/15/2017                        Electrolytes Lab Results  Component Value Date   NA 137 07/15/2017   K 4.1 07/15/2017   CL 101 07/15/2017   CALCIUM 9.4 07/15/2017                        Neuropathy Markers Lab Results  Component Value Date   HIV NONREACTIVE 12/18/2013                         Coagulation Parameters Lab Results  Component Value Date   PLT 247 07/15/2017   DDIMER 0.28 07/02/2013                        Cardiovascular Markers Lab Results  Component Value Date   TROPONINI <0.03 07/15/2017   HGB 13.8 07/15/2017   HCT 41.8 07/15/2017                         ID Test(s) Lab Results  Component Value Date   HIV NONREACTIVE 12/18/2013   STAPHAUREUS NEGATIVE 11/06/2016   MRSAPCR NEGATIVE 11/06/2016   Note: Lab results reviewed.  Recent Diagnostic Imaging Results  US BREAST LTD UNI RIGHT INC AXILLA CLINICAL DATA:  Palpable lump in the right chest wall above the breast.  EXAM: DIGITAL DIAGNOSTIC BILATERAL MAMMOGRAM WITH CAD AND TOMO  ULTRASOUND RIGHT BREAST  COMPARISON:  Previous exam(s).  ACR Breast Density Category  b: There are scattered areas of fibroglandular density.  FINDINGS: Two asymmetries in the right breast resolve on additional imaging. Stable benign calcifications are seen on the left. No suspicious findings are seen in either breast.  Mammographic images were processed with CAD.  On physical exam, no suspicious lumps are identified.  Targeted ultrasound is performed, showing no sonographic correlate to the patient's focal pain in the superior right chest wall above the breast.  IMPRESSION: No mammographic or sonographic evidence of malignancy.  RECOMMENDATION: Treatment of the patient's focal pain should be based on clinical and physical exam given lack of imaging findings. The region of pain is higher than could be evaluated with mammography. The ultrasound was normal in this region. If clinical concern persists, a CT scan or MRI of the chest wall could better evaluate this region. Recommend annual  screening mammography.  I have discussed the findings and recommendations with the patient. Results were also provided in writing at the conclusion of the visit. If applicable, a reminder letter will be sent to the patient regarding the next appointment.  BI-RADS CATEGORY  2: Benign.  Electronically Signed   By: Dorise Bullion III M.D   On: 10/22/2018 15:26 MM DIAG BREAST TOMO BILATERAL CLINICAL DATA:  Palpable lump in the right chest wall above the breast.  EXAM: DIGITAL DIAGNOSTIC BILATERAL MAMMOGRAM WITH CAD AND TOMO  ULTRASOUND RIGHT BREAST  COMPARISON:  Previous exam(s).  ACR Breast Density Category b: There are scattered areas of fibroglandular density.  FINDINGS: Two asymmetries in the right breast resolve on additional imaging. Stable benign calcifications are seen on the left. No suspicious findings are seen in either breast.  Mammographic images were processed with CAD.  On physical exam, no suspicious lumps are identified.  Targeted ultrasound is  performed, showing no sonographic correlate to the patient's focal pain in the superior right chest wall above the breast.  IMPRESSION: No mammographic or sonographic evidence of malignancy.  RECOMMENDATION: Treatment of the patient's focal pain should be based on clinical and physical exam given lack of imaging findings. The region of pain is higher than could be evaluated with mammography. The ultrasound was normal in this region. If clinical concern persists, a CT scan or MRI of the chest wall could better evaluate this region. Recommend annual screening mammography.  I have discussed the findings and recommendations with the patient. Results were also provided in writing at the conclusion of the visit. If applicable, a reminder letter will be sent to the patient regarding the next appointment.  BI-RADS CATEGORY  2: Benign.  Electronically Signed   By: Dorise Bullion III M.D   On: 10/22/2018 15:26  Complexity Note: Imaging results reviewed. Results shared with Ms. Pinkley, using Layman's terms.                               Meds   Current Outpatient Medications:  .  acetaminophen (TYLENOL) 500 MG tablet, Take 500 mg by mouth 2 (two) times daily as needed for headache., Disp: , Rfl:  .  acyclovir (ZOVIRAX) 400 MG tablet, Take 400 mg by mouth 2 (two) times daily. , Disp: , Rfl:  .  ALPRAZolam (XANAX) 0.5 MG tablet, Take 0.5 mg by mouth 3 (three) times daily as needed for anxiety., Disp: , Rfl:  .  amLODipine (NORVASC) 5 MG tablet, Take 5 mg by mouth daily. , Disp: , Rfl:  .  Cholecalciferol (VITAMIN D-1000 MAX ST) 1000 units tablet, Take 1,000 Units by mouth daily., Disp: , Rfl:  .  clotrimazole (LOTRIMIN) 1 % cream, Apply 1 application topically daily as needed (itching). , Disp: , Rfl:  .  diazepam (VALIUM) 10 MG tablet, Take 10 mg by mouth daily as needed for anxiety., Disp: , Rfl:  .  diclofenac sodium (VOLTAREN) 1 % GEL, Apply topically., Disp: , Rfl:  .  escitalopram  (LEXAPRO) 10 MG tablet, Take 10 mg by mouth daily as needed (anxiety). , Disp: , Rfl:  .  estradiol (ESTRACE) 0.5 MG tablet, Take 0.5 mg by mouth daily., Disp: , Rfl:  .  liothyronine (CYTOMEL) 5 MCG tablet, Take 1 tablet by mouth daily., Disp: , Rfl:  .  meclizine (ANTIVERT) 12.5 MG tablet, TAKE 1 TABLET BY MOUTH THREE TIMES DAILY AS NEEDED FOR DIZZINESS FOR  UP TO 10 DAYS., Disp: , Rfl:  .  oxybutynin (DITROPAN-XL) 5 MG 24 hr tablet, Take 5 mg by mouth 2 (two) times daily. , Disp: , Rfl:  .  pantoprazole (PROTONIX) 40 MG tablet, Take 40 mg by mouth daily., Disp: , Rfl:  .  Tetrahydrozoline HCl (VISINE OP), Apply 1 drop to eye daily as needed (irritation)., Disp: , Rfl:  .  cyclobenzaprine (FLEXERIL) 10 MG tablet, Take 1 tablet by mouth as needed for muscle spasms. , Disp: , Rfl:  .  DULoxetine (CYMBALTA) 30 MG capsule, Take 1 capsule (30 mg total) by mouth daily., Disp: 90 capsule, Rfl: 0 .  [START ON 03/06/2019] gabapentin (NEURONTIN) 300 MG capsule, Take 1 capsule (300 mg total) by mouth 2 (two) times daily., Disp: 60 capsule, Rfl: 2 .  meloxicam (MOBIC) 7.5 MG tablet, Take by mouth., Disp: , Rfl:  .  nortriptyline (PAMELOR) 10 MG capsule, Take 1 capsule by mouth at bedtime as needed for sleep. , Disp: , Rfl:  .  nystatin (MYCOSTATIN) 100000 UNIT/ML suspension, Take 100,000 Units by mouth daily. Swish and spit, Disp: , Rfl: 0 .  [START ON 03/06/2019] oxyCODONE-acetaminophen (PERCOCET) 10-325 MG tablet, Take 1 tablet by mouth 2 (two) times daily as needed for pain. Must last 30 days., Disp: 45 tablet, Rfl: 0 .  predniSONE (DELTASONE) 10 MG tablet, 50m x 2 days, 373mx 2days, 2047m 2days, 21m33m2days, Disp: , Rfl:   ROS  Constitutional: Denies any fever or chills Gastrointestinal: No reported hemesis, hematochezia, vomiting, or acute GI distress Musculoskeletal: Denies any acute onset joint swelling, redness, loss of ROM, or weakness Neurological: No reported episodes of acute onset apraxia,  aphasia, dysarthria, agnosia, amnesia, paralysis, loss of coordination, or loss of consciousness  Allergies  Ms. TerrNorthrupallergic to shellfish allergy; peanut-containing drug products; codeine; diphtheria toxoid; and tetanus toxoids.  [place] Terr[place]: Ms. TerrGoatesports no history of drug use. Alcohol:  reports no history of alcohol use. Tobacco:  reports that she has never smoked. She has never used smokeless tobacco. Medical:  has a past medical history of Anxiety, Arthritis, Asthma, Balance problem, Brain cyst, Depression, Difficult intubation, Flu (08/10/2018), GERD (gastroesophageal reflux disease), Headache, Herpes, History of kidney stones, History of pneumonia, Hypertension, Inflammation of shoulder joint, Memory difficulties, Neuromuscular disorder (HCC)Hyderneumothorax on right, PONV (postoperative nausea and vomiting), Recurrent falls, Shortness of breath dyspnea, and Urinary frequency. Surgical: Ms. TerrCreers a past surgical history that includes Abdominal hysterectomy; Rotator cuff repair (Left); Foreign Body Removal (Left); Carpal tunnel release (Left, 02/16/2013); Carpal tunnel release (Right, 03/27/2013); Cesarean section; Anterior cervical decomp/discectomy fusion (N/A, 08/06/2014); Colonoscopy; Nasal sinus surgery (N/A, 04/13/2015); Shoulder arthroscopy with distal clavicle resection (Left, 05/18/2015); Shoulder acromioplasty (Left, 05/18/2015); Cholecystectomy (N/A, 06/17/2015); Neck surgery (2016); Anterior cervical decomp/discectomy fusion (N/A, 07/04/2016); Posterior cervical fusion/foraminotomy (N/A, 11/13/2016); Knee arthroscopy; and Extracorporeal shock wave lithotripsy (Left, 09/04/2018). Family: family history includes Hypertension in her father and another family member.  Constitutional Exam  General appearance: Well nourished, well developed, and well hydrated. In no apparent acute distress Vitals:   02/03/19 1027  BP: 131/88  Pulse: (!) 103  Temp: 98.1 F (36.7 C)  SpO2:  100%  Weight: 149 lb (67.6 kg)  Height: '5\' 1"'  (1.549 m)   BMI Assessment: Estimated body mass index is 28.15 kg/m as calculated from the following:   Height as of this encounter: '5\' 1"'  (1.549 m).   Weight as of this encounter: 149 lb (  67.6 kg).  BMI interpretation table: BMI level Category Range association with higher incidence of chronic pain  <18 kg/m2 Underweight   18.5-24.9 kg/m2 Ideal body weight   25-29.9 kg/m2 Overweight Increased incidence by 20%  30-34.9 kg/m2 Obese (Class I) Increased incidence by 68%  35-39.9 kg/m2 Severe obesity (Class II) Increased incidence by 136%  >40 kg/m2 Extreme obesity (Class III) Increased incidence by 254%   Patient's current BMI Ideal Body weight  Body mass index is 28.15 kg/m. Ideal body weight: 47.8 kg (105 lb 6.1 oz) Adjusted ideal body weight: 55.7 kg (122 lb 13.2 oz)   BMI Readings from Last 4 Encounters:  02/03/19 28.15 kg/m  01/28/19 28.34 kg/m  01/19/19 28.15 kg/m  09/15/18 28.15 kg/m   Wt Readings from Last 4 Encounters:  02/03/19 149 lb (67.6 kg)  01/28/19 150 lb (68 kg)  01/19/19 149 lb (67.6 kg)  09/15/18 149 lb (67.6 kg)  Psych/Mental status: Alert, oriented x 3 (person, place, & time)       Eyes: PERLA Respiratory: No evidence of acute respiratory distress  Cervical Spine Area Exam  Skin & Axial Inspection: Well healed scar from previous spine surgery detected Alignment: Asymmetric Functional ROM: Decreased ROM, bilaterally Stability: No instability detected Muscle Tone/Strength: Guarding observed Sensory (Neurological): Dermatomal pain pattern And musculoskeletal   Upper Extremity (UE) Exam    Side: Right upper extremity  Side: Left upper extremity  Skin & Extremity Inspection: Skin color, temperature, and hair growth are WNL. No peripheral edema or cyanosis. No masses, redness, swelling, asymmetry, or associated skin lesions. No contractures.  Skin & Extremity Inspection: Skin color, temperature, and hair  growth are WNL. No peripheral edema or cyanosis. No masses, redness, swelling, asymmetry, or associated skin lesions. No contractures.  Functional ROM: Pain restricted ROM for all joints of upper extremity  Functional ROM: Pain restricted ROM for all joints of upper extremity  Muscle Tone/Strength: Functionally intact. No obvious neuro-muscular anomalies detected.  Muscle Tone/Strength: Functionally intact. No obvious neuro-muscular anomalies detected.  Sensory (Neurological): Dermatomal pain pattern          Sensory (Neurological): Non-dermatomal pain pattern          Palpation: No palpable anomalies              Palpation: No palpable anomalies              Provocative Test(s):  Phalen's test: deferred Tinel's test: deferred Apley's scratch test (touch opposite shoulder):  Action 1 (Across chest): Decreased ROM Action 2 (Overhead): Decreased ROM Action 3 (LB reach): Decreased ROM   Provocative Test(s):  Phalen's test: deferred Tinel's test: deferred Apley's scratch test (touch opposite shoulder):  Action 1 (Across chest): Decreased ROM Action 2 (Overhead): Decreased ROM Action 3 (LB reach): Decreased ROM    Thoracic Spine Area Exam  Skin & Axial Inspection: No masses, redness, or swelling Alignment: Symmetrical Functional ROM: Decreased ROM Stability: No instability detected Muscle Tone/Strength: Functionally intact. No obvious neuro-muscular anomalies detected. Sensory (Neurological): Musculoskeletal pain pattern Muscle strength & Tone: No palpable anomalies  Lumbar Spine Area Exam  Skin & Axial Inspection: No masses, redness, or swelling Alignment: Symmetrical Functional ROM: Unrestricted ROM       Stability: No instability detected Muscle Tone/Strength: Functionally intact. No obvious neuro-muscular anomalies detected. Sensory (Neurological): Musculoskeletal pain pattern  Gait & Posture Assessment  Ambulation: Unassisted Gait: Relatively normal for age and body  habitus Posture: WNL   Lower Extremity Exam    Side: Right lower  extremity  Side: Left lower extremity  Stability: No instability observed          Stability: No instability observed          Skin & Extremity Inspection: Skin color, temperature, and hair growth are WNL. No peripheral edema or cyanosis. No masses, redness, swelling, asymmetry, or associated skin lesions. No contractures.  Skin & Extremity Inspection: Skin color, temperature, and hair growth are WNL. No peripheral edema or cyanosis. No masses, redness, swelling, asymmetry, or associated skin lesions. No contractures.  Functional ROM: Unrestricted ROM                  Functional ROM: Unrestricted ROM                  Muscle Tone/Strength: Functionally intact. No obvious neuro-muscular anomalies detected.  Muscle Tone/Strength: Functionally intact. No obvious neuro-muscular anomalies detected.  Sensory (Neurological): Unimpaired        Sensory (Neurological): Unimpaired        DTR: Patellar: deferred today Achilles: deferred today Plantar: deferred today  DTR: Patellar: deferred today Achilles: deferred today Plantar: deferred today  Palpation: No palpable anomalies  Palpation: No palpable anomalies   Assessment   Status Diagnosis  Controlled Controlled Controlled 1. Chronic pain syndrome   2. Cervical spondylosis without myelopathy   3. Cervical fusion syndrome (C2-T1)   4. Degenerative disc disease, cervical   5. Hx of cervical spine surgery   6. Occipital headache   7. Bilateral occipital neuralgia   8. Cervicalgia      Reduce oxycodone intake to 1 tablet as needed daily, change in monthly quantity from 60-45.  Dose reduction.  Instructed to increase gabapentin 1 month to 300 mg twice daily 200 mg twice daily.  Plan of Care  Pharmacotherapy (Medications Ordered): Meds ordered this encounter  Medications  . oxyCODONE-acetaminophen (PERCOCET) 10-325 MG tablet    Sig: Take 1 tablet by mouth 2 (two) times daily  as needed for pain. Must last 30 days.    Dispense:  45 tablet    Refill:  0    Chronic Pain. (STOP Act - Not applicable). Fill one day early if closed on scheduled refill date.  . gabapentin (NEURONTIN) 300 MG capsule    Sig: Take 1 capsule (300 mg total) by mouth 2 (two) times daily.    Dispense:  60 capsule    Refill:  2    Do not place this medication, or any other prescription from our practice, on "Automatic Refill". Patient may have prescription filled one day early if pharmacy is closed on scheduled refill date.   Orders:  Orders Placed This Encounter  Procedures  . ToxASSURE Select 13 (MW), Urine    Volume: 30 ml(s). Minimum 3 ml of urine is needed. Document temperature of fresh sample. Indications: Long term (current) use of opiate analgesic (Z79.891)    Lab Orders     ToxASSURE Select 13 (MW), Urine  Planned follow-up:   Return in about 8 weeks (around 03/31/2019) for Medication Management.     Consider TPI, cervical facet medial branch nerve block, suprascapular nerve block   Recent Visits No visits were found meeting these conditions.  Showing recent visits within past 90 days and meeting all other requirements   Today's Visits Date Type Provider Dept  02/03/19 Office Visit Gillis Santa, MD [facility]  Showing today's visits and meeting all other requirements   Future Appointments Date Type Provider Dept  03/31/19 Appointment Gillis Santa,  MD [facility]  Showing future appointments within next 90 days and meeting all other requirements   Primary Care Physician: Dion Body, MD Location: [facility] Note by: Gillis Santa, MD Date: 02/03/2019; Time: 1:46 PM  Note: This dictation was prepared with Dragon dictation. Any transcriptional errors that may result from this process are unintentional.

## 2019-02-03 NOTE — Patient Instructions (Addendum)
1. Reduce Percocet to no more than 1 to 1.5 tablets a day as needed. New quantity will be 45/month   2. Continue Gabapentin, increase to 300 mg twice daily in 1 month (when you pick up Rx for Percocet above)

## 2019-02-03 NOTE — Progress Notes (Signed)
Nursing Pain Medication Assessment:  Safety precautions to be maintained throughout the outpatient stay will include: orient to surroundings, keep bed in low position, maintain call bell within reach at all times, provide assistance with transfer out of bed and ambulation.  Medication Inspection Compliance: Pill count conducted under aseptic conditions, in front of the patient. Neither the pills nor the bottle was removed from the patient's sight at any time. Once count was completed pills were immediately returned to the patient in their original bottle.  Medication: Oxycodone/APAP Pill/Patch Count: 48 of 60 pills remain Pill/Patch Appearance: Markings consistent with prescribed medication Bottle Appearance: Standard pharmacy container. Clearly labeled. Filled Date: 6 / 79 / 2020 Last Medication intake:  01/15/19

## 2019-02-05 LAB — TOXASSURE SELECT 13 (MW), URINE

## 2019-02-13 DIAGNOSIS — R0989 Other specified symptoms and signs involving the circulatory and respiratory systems: Secondary | ICD-10-CM | POA: Diagnosis not present

## 2019-02-16 DIAGNOSIS — K219 Gastro-esophageal reflux disease without esophagitis: Secondary | ICD-10-CM | POA: Diagnosis not present

## 2019-02-16 DIAGNOSIS — R1314 Dysphagia, pharyngoesophageal phase: Secondary | ICD-10-CM | POA: Diagnosis not present

## 2019-02-27 ENCOUNTER — Other Ambulatory Visit: Payer: Self-pay | Admitting: Gastroenterology

## 2019-02-27 DIAGNOSIS — R1313 Dysphagia, pharyngeal phase: Secondary | ICD-10-CM

## 2019-03-05 DIAGNOSIS — I10 Essential (primary) hypertension: Secondary | ICD-10-CM | POA: Diagnosis not present

## 2019-03-11 DIAGNOSIS — H04123 Dry eye syndrome of bilateral lacrimal glands: Secondary | ICD-10-CM | POA: Diagnosis not present

## 2019-03-16 DIAGNOSIS — R55 Syncope and collapse: Secondary | ICD-10-CM | POA: Diagnosis not present

## 2019-03-17 ENCOUNTER — Other Ambulatory Visit: Payer: Self-pay | Admitting: Family Medicine

## 2019-03-17 DIAGNOSIS — R55 Syncope and collapse: Secondary | ICD-10-CM

## 2019-03-18 DIAGNOSIS — R51 Headache: Secondary | ICD-10-CM | POA: Diagnosis not present

## 2019-03-18 DIAGNOSIS — M542 Cervicalgia: Secondary | ICD-10-CM | POA: Diagnosis not present

## 2019-03-18 DIAGNOSIS — R413 Other amnesia: Secondary | ICD-10-CM | POA: Diagnosis not present

## 2019-03-18 DIAGNOSIS — R42 Dizziness and giddiness: Secondary | ICD-10-CM | POA: Diagnosis not present

## 2019-03-20 ENCOUNTER — Other Ambulatory Visit: Payer: Self-pay

## 2019-03-20 ENCOUNTER — Ambulatory Visit
Admission: RE | Admit: 2019-03-20 | Discharge: 2019-03-20 | Disposition: A | Discharge status: Home / Self Care | Payer: Medicare Other | Source: Ambulatory Visit | Attending: Urology | Admitting: Urology

## 2019-03-20 ENCOUNTER — Ambulatory Visit
Admission: RE | Admit: 2019-03-20 | Discharge: 2019-03-20 | Disposition: A | Discharge status: Home / Self Care | Payer: Medicare Other | Source: Ambulatory Visit | Attending: Family Medicine | Admitting: Family Medicine

## 2019-03-20 DIAGNOSIS — R55 Syncope and collapse: Secondary | ICD-10-CM | POA: Diagnosis not present

## 2019-03-20 DIAGNOSIS — N200 Calculus of kidney: Secondary | ICD-10-CM

## 2019-03-20 DIAGNOSIS — K6289 Other specified diseases of anus and rectum: Secondary | ICD-10-CM | POA: Diagnosis not present

## 2019-03-25 DIAGNOSIS — I6523 Occlusion and stenosis of bilateral carotid arteries: Secondary | ICD-10-CM | POA: Diagnosis not present

## 2019-03-25 DIAGNOSIS — R55 Syncope and collapse: Secondary | ICD-10-CM | POA: Diagnosis not present

## 2019-03-25 IMAGING — CR DG CHEST 2V
2 series · 2 of 2 positions shown · non-contrast
Comparison: July 08, 2016

CLINICAL DATA: Preoperative evaluation for cervical spine surgery.

EXAM:
CHEST  2 VIEW

[w chest pa]
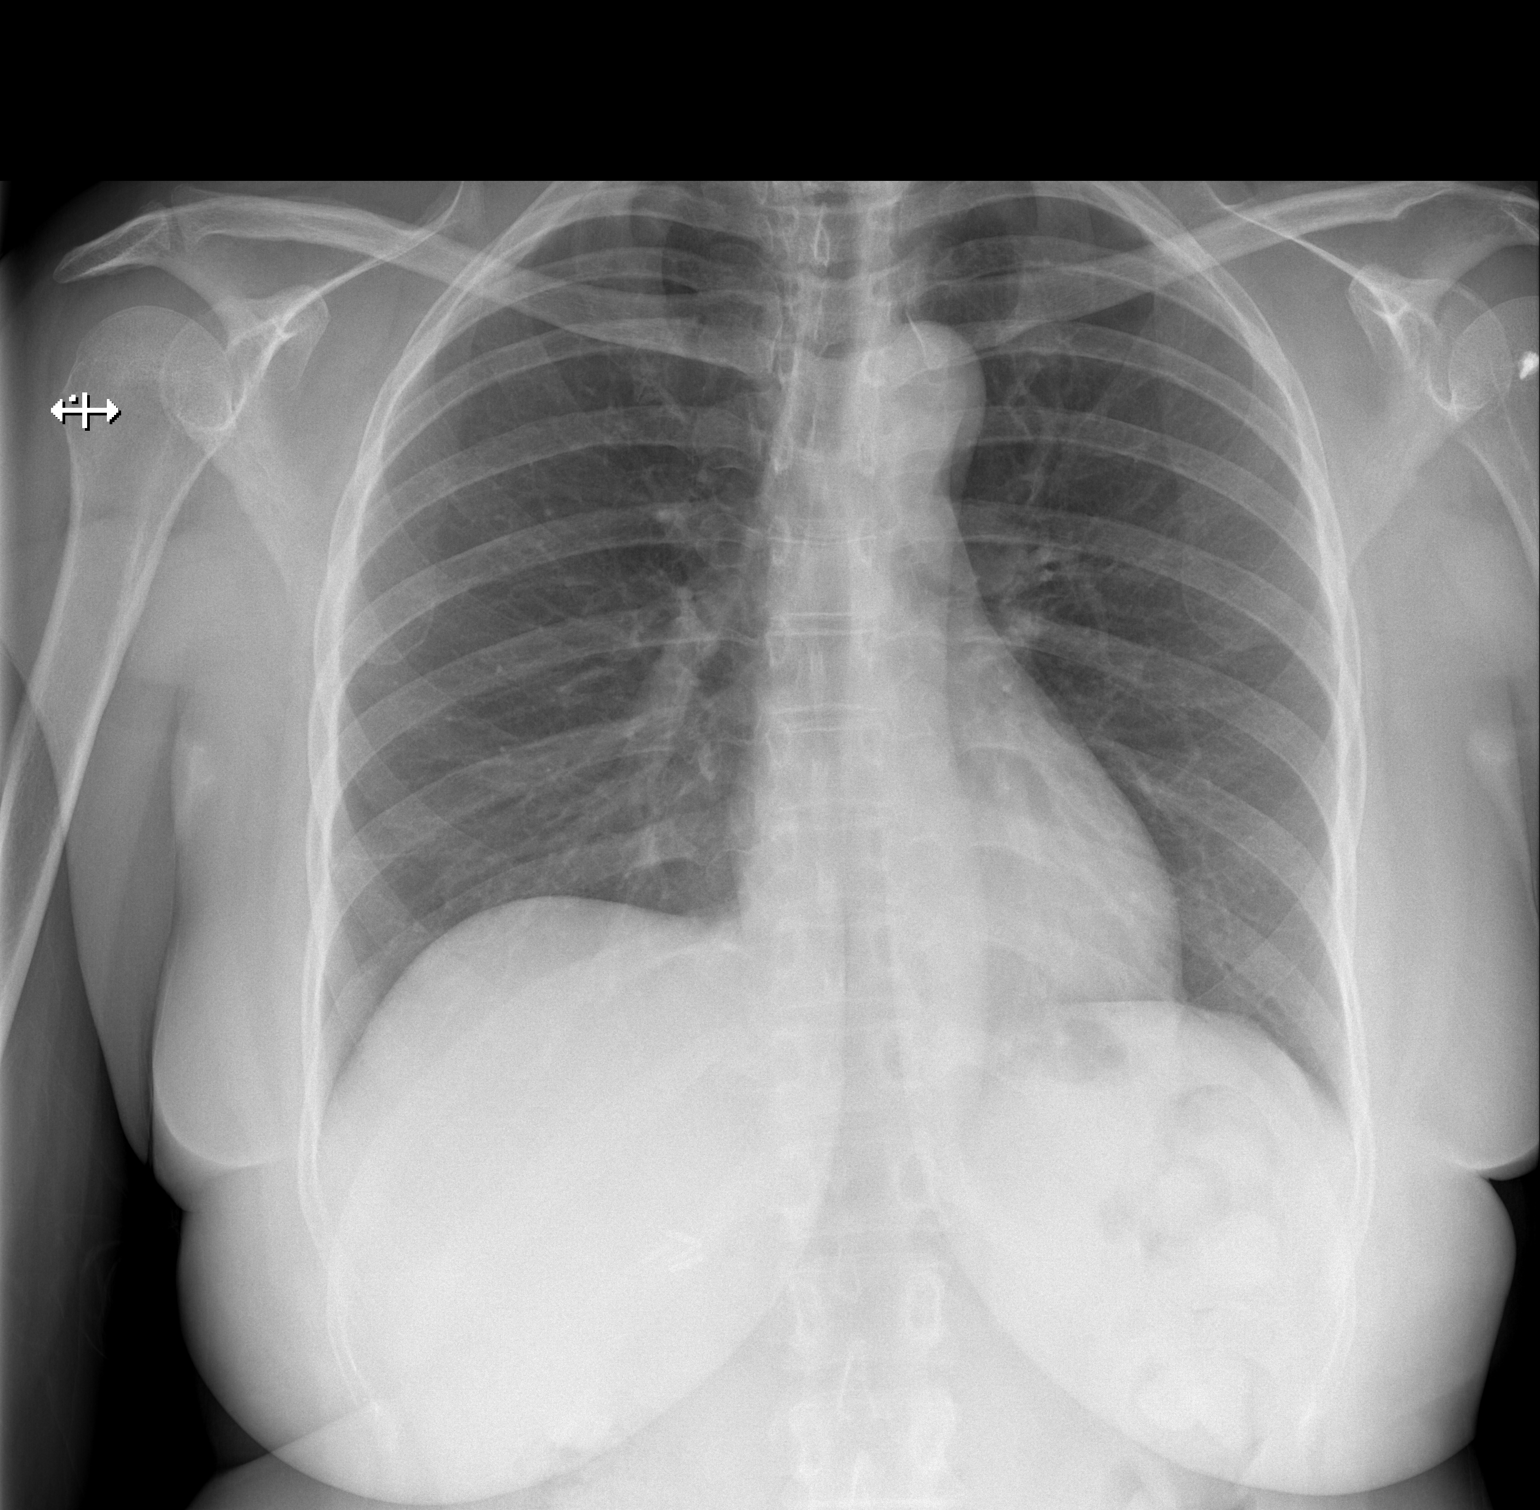

[w chest lat]
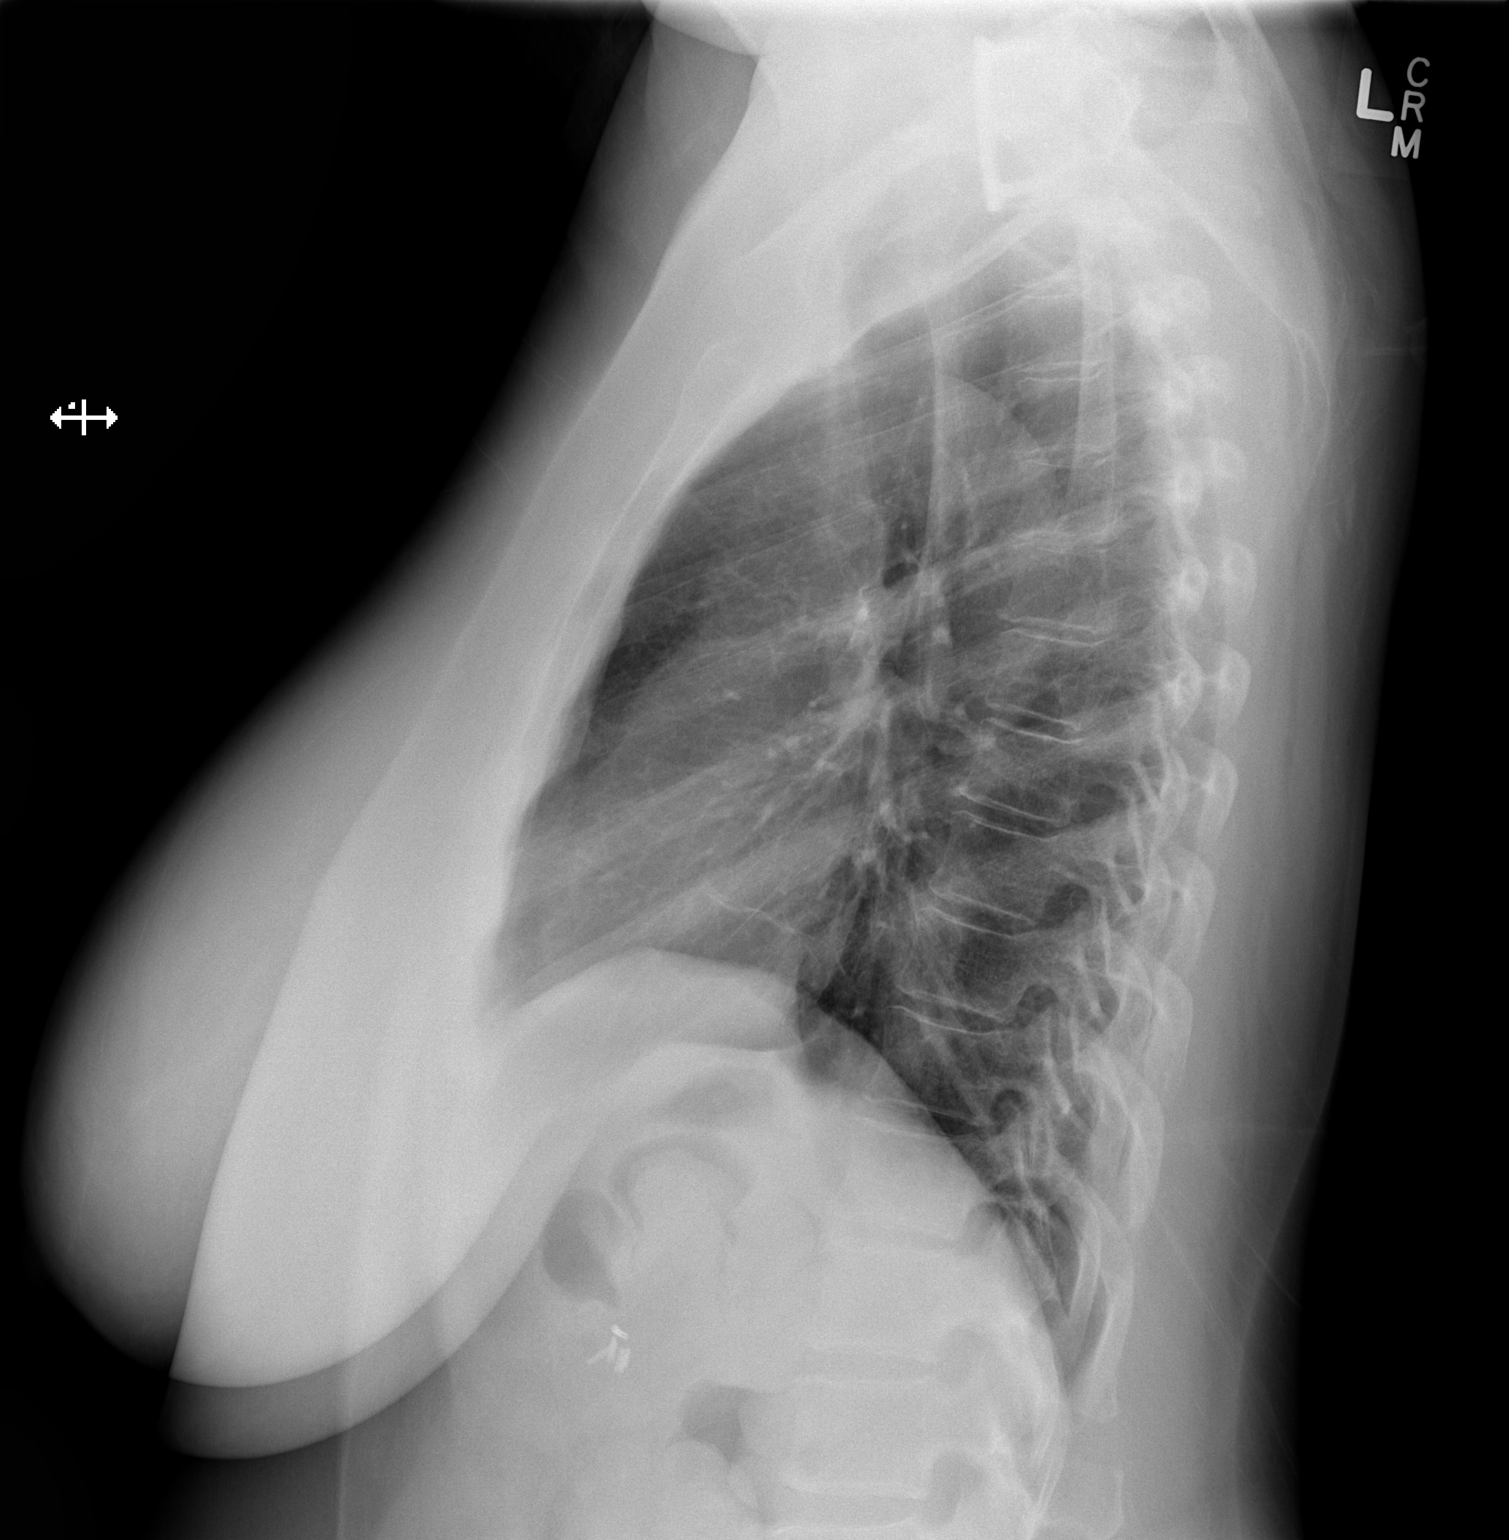

[2 of 2 positions shown; findings below may reference images not displayed]

FINDINGS: Lungs are clear. Heart size and pulmonary vascularity are normal. No
adenopathy. There is postoperative change in the lower cervical
spine region.
IMPRESSION: No edema or consolidation.

## 2019-03-27 DIAGNOSIS — R55 Syncope and collapse: Secondary | ICD-10-CM | POA: Diagnosis not present

## 2019-03-30 ENCOUNTER — Encounter: Payer: Self-pay | Admitting: Student in an Organized Health Care Education/Training Program

## 2019-03-31 ENCOUNTER — Other Ambulatory Visit: Payer: Self-pay

## 2019-03-31 ENCOUNTER — Ambulatory Visit (HOSPITAL_BASED_OUTPATIENT_CLINIC_OR_DEPARTMENT_OTHER): Payer: Medicare Other | Admitting: Student in an Organized Health Care Education/Training Program

## 2019-03-31 DIAGNOSIS — G894 Chronic pain syndrome: Secondary | ICD-10-CM

## 2019-03-31 DIAGNOSIS — L720 Epidermal cyst: Secondary | ICD-10-CM | POA: Diagnosis not present

## 2019-04-01 IMAGING — RF DG C-ARM 61-120 MIN
1 series · 1 of 1 positions shown · non-contrast
Comparison: CT myelogram 05/11/2016.

CLINICAL DATA: Neck pain.  Pseudarthrosis.

EXAM:
CERVICAL SPINE 1 VIEW

[Series 1: run · 1 of 1 slices shown]
[im 1/1]
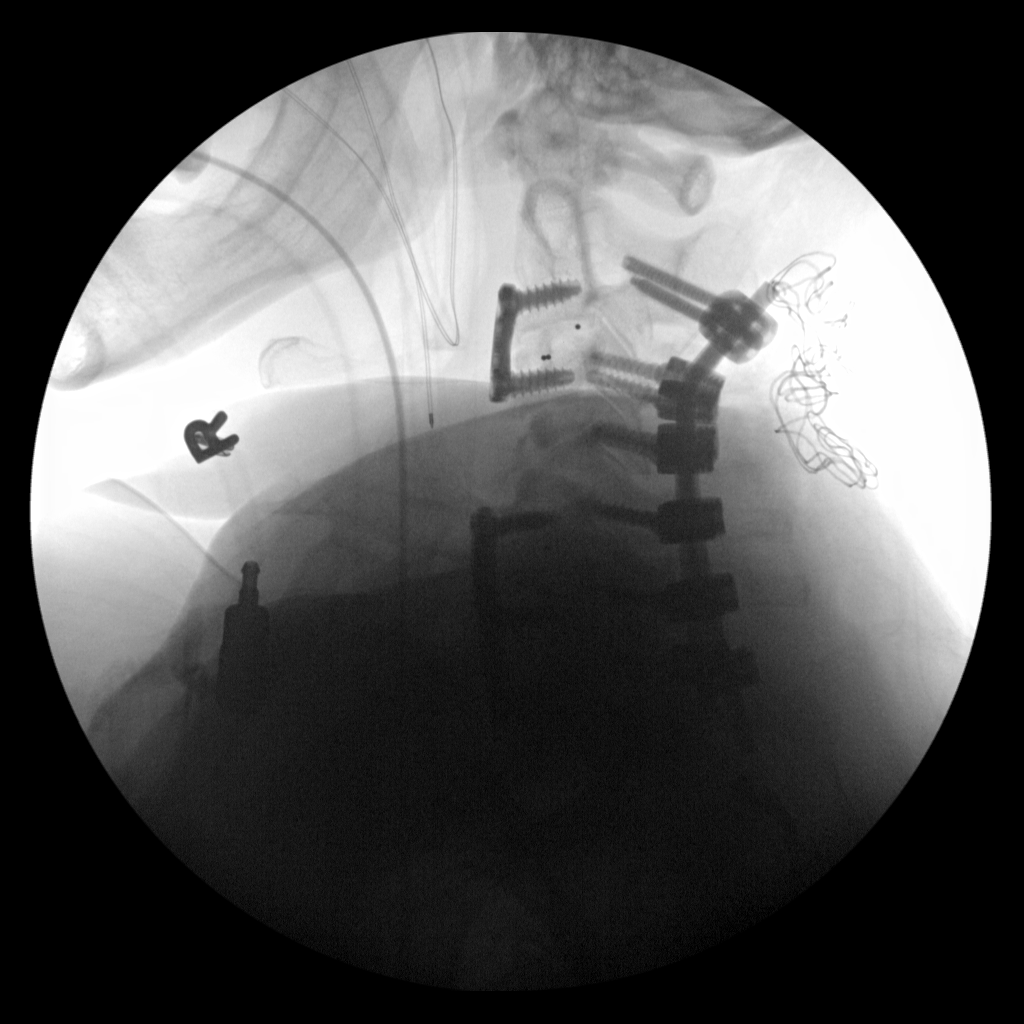

[1 of 1 positions shown; findings below may reference images not displayed]

FINDINGS: Intraoperative radiographs. Congenital CT C3 fusion. Instrumented
C3-4 ACDF. Instrumented C6-T1 ACDF. Pseudarthrosis C4-C5 and C5-6
with retropulsion of C5.

Instrumented posterior fusion from C2 downward. By report this
extends to T1 but this is incompletely visualized.
IMPRESSION: Posterior fusion as described. Retropulsed C5 is incompletely
evaluated. Consider CT cervical spine without contrast.

## 2019-04-02 IMAGING — DX DG CERVICAL SPINE 2 OR 3 VIEWS
2 series · 2 of 2 positions shown · non-contrast
Comparison: Cervical spine images performed 11/13/2016, and
cervical spine radiographs performed 07/16/2016

CLINICAL DATA: Postoperative radiograph, status post posterior
fusion at C2-T1. Initial encounter.

EXAM:
CERVICAL SPINE - 2-3 VIEW

[w cervical spine lat]
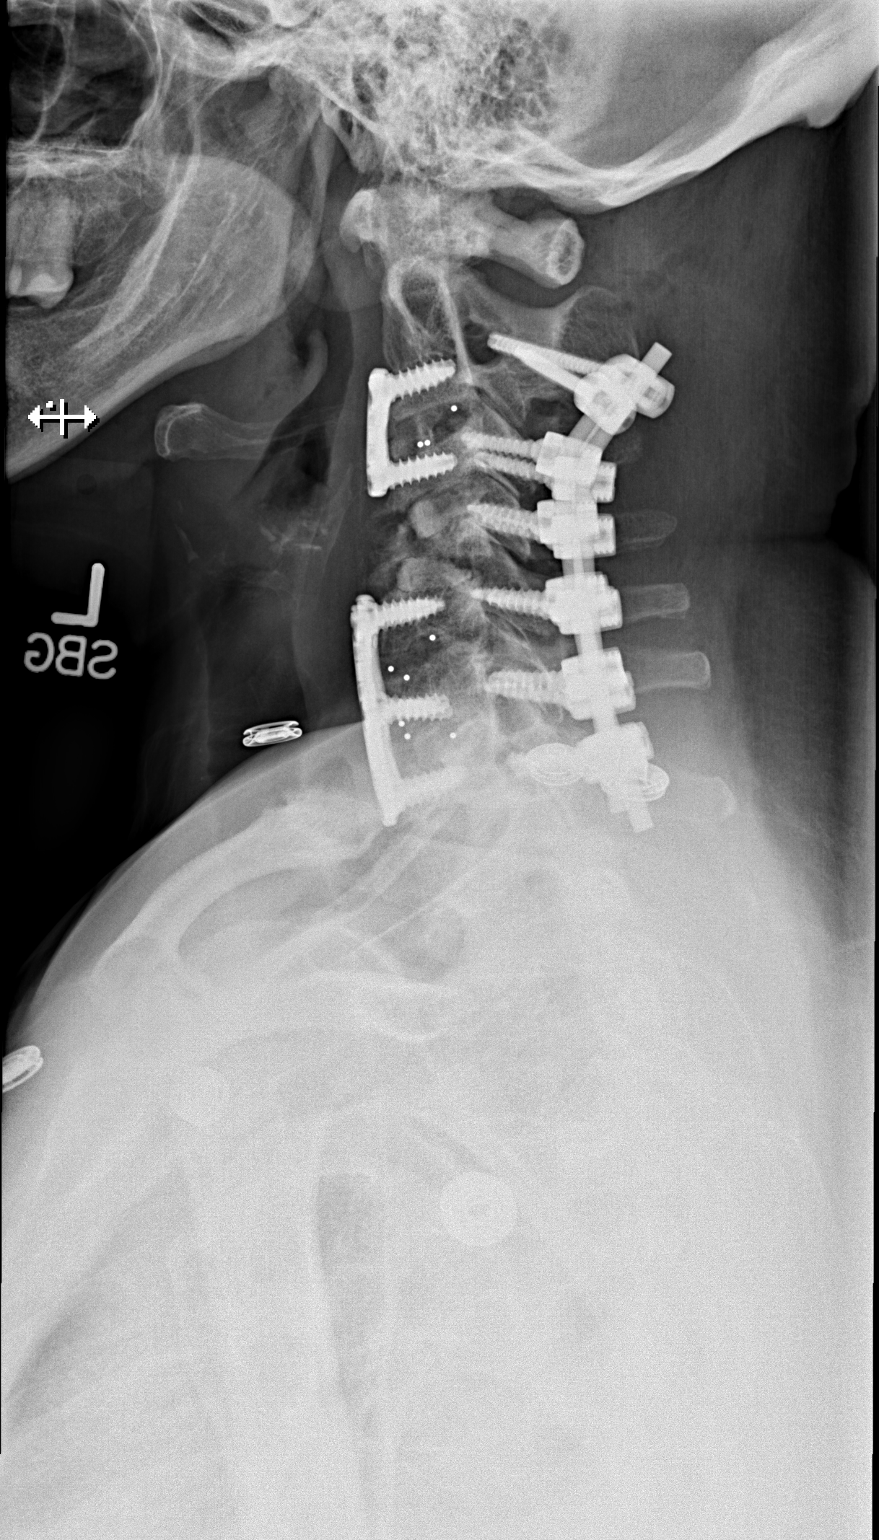

[w cervical spine ap]
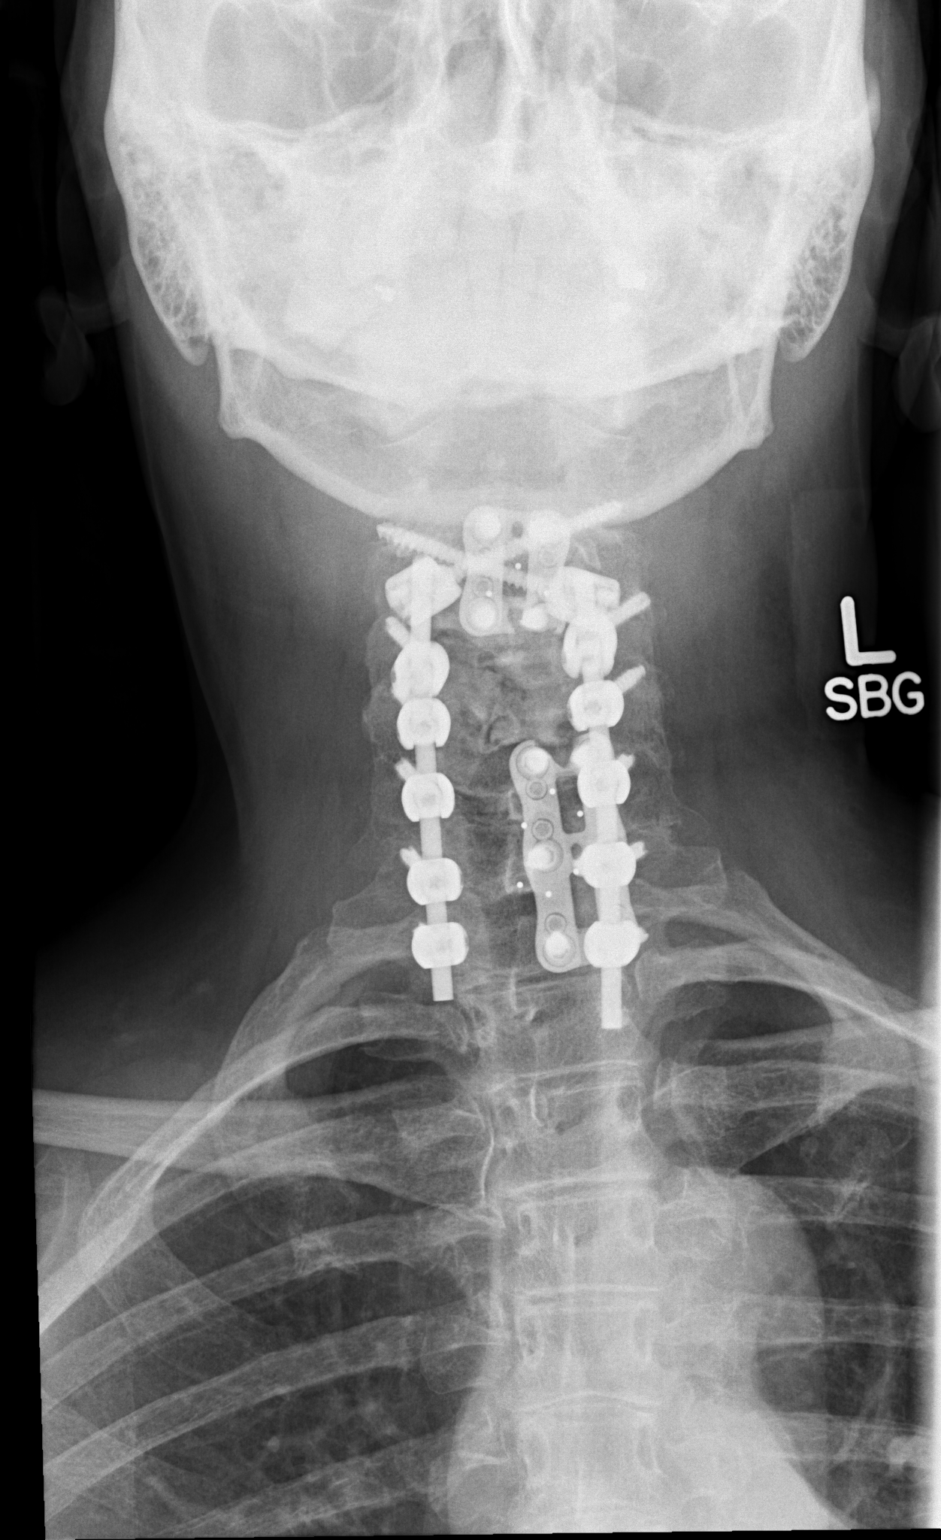

[2 of 2 positions shown; findings below may reference images not displayed]

FINDINGS: The patient is status post posterior cervical spinal fusion at
C2-T1. The superiormost pedicle screws extend across the fused
posterior elements of C2 and C3, and there is underlying
developmental osseous fusion of C2 and C3.

Previously noted anterior cervical spinal fusion hardware is again
noted at C3-C4 and C6-T1. Retropulsion of C5 is again noted, with
associated pseudarthroses at C4-C5 and C5-C6.

The visualized lung apices are clear.  No new fractures are seen.
IMPRESSION: Status post posterior cervical spinal fusion at C2-T1. Underlying
anterior cervical spinal fusion hardware again noted at C3-C4 and
C6-T1, with stable appearance to retropulsion of C5. Underlying
developmental osseous fusion of C2 and C3.

## 2019-04-02 NOTE — Progress Notes (Signed)
Safety precautions to be maintained throughout the outpatient stay will include: orient to surroundings, keep bed in low position, maintain call bell within reach at all times, provide assistance with transfer out of bed and ambulation.  

## 2019-04-07 ENCOUNTER — Encounter: Payer: Self-pay | Admitting: Student in an Organized Health Care Education/Training Program

## 2019-04-07 ENCOUNTER — Ambulatory Visit
Payer: Medicare Other | Attending: Student in an Organized Health Care Education/Training Program | Admitting: Student in an Organized Health Care Education/Training Program

## 2019-04-07 ENCOUNTER — Other Ambulatory Visit: Payer: Self-pay

## 2019-04-07 DIAGNOSIS — Q761 Klippel-Feil syndrome: Secondary | ICD-10-CM | POA: Diagnosis not present

## 2019-04-07 DIAGNOSIS — M47812 Spondylosis without myelopathy or radiculopathy, cervical region: Secondary | ICD-10-CM

## 2019-04-07 DIAGNOSIS — Z9889 Other specified postprocedural states: Secondary | ICD-10-CM

## 2019-04-07 DIAGNOSIS — G894 Chronic pain syndrome: Secondary | ICD-10-CM

## 2019-04-07 DIAGNOSIS — R51 Headache: Secondary | ICD-10-CM

## 2019-04-07 DIAGNOSIS — M542 Cervicalgia: Secondary | ICD-10-CM

## 2019-04-07 DIAGNOSIS — M5030 Other cervical disc degeneration, unspecified cervical region: Secondary | ICD-10-CM | POA: Diagnosis not present

## 2019-04-07 DIAGNOSIS — R519 Headache, unspecified: Secondary | ICD-10-CM

## 2019-04-07 DIAGNOSIS — M5481 Occipital neuralgia: Secondary | ICD-10-CM

## 2019-04-07 MED ORDER — OXYCODONE-ACETAMINOPHEN 10-325 MG PO TABS: 1.0000 | ORAL_TABLET | Freq: Two times a day (BID) | ORAL | 0 refills | Status: DC | PRN

## 2019-04-07 MED ORDER — OXYCODONE-ACETAMINOPHEN 10-325 MG PO TABS: 1.0000 | ORAL_TABLET | Freq: Two times a day (BID) | ORAL | 0 refills | Status: AC | PRN

## 2019-04-07 NOTE — Progress Notes (Signed)
Pain Management Virtual Encounter Note - Virtual Visit via Telephone Telehealth (real-time audio visits between healthcare provider and patient).   Patient's Phone No. & Preferred Pharmacy:  [phone] (home); [phone] (mobile); (Preferred) [phone] [email or web address] .[facility]  [place], [place] - [address] [address] Phone: [phone] Fax: [phone]    Pre-screening note:  Our staff contacted Kimberly [name] and offered her an "in person", "face-to-face" appointment versus a telephone encounter. She indicated preferring the telephone encounter, at this time.   Reason for Virtual Visit: COVID-19*  Social distancing based on CDC and AMA recommendations.   I contacted Kimberly [name] on 04/07/2019 via telephone.      I clearly identified myself as Gillis Santa, MD. I verified that I was speaking with the correct person using two identifiers (Name: Kimberly [name], and date of birth: 04-27-1961).  Advanced Informed Consent I sought verbal advanced consent from Kimberly [name] for virtual visit interactions. I informed Kimberly [name] of possible security and privacy concerns, risks, and limitations associated with providing "not-in-person" medical evaluation and management services. I also informed Kimberly [name] of the availability of "in-person" appointments. Finally, I informed her that there would be a charge for the virtual visit and that she could be  personally, fully or partially, financially responsible for it. Kimberly [name] expressed understanding and agreed to proceed.   Historic Elements   Kimberly [name] is a 58 y.o. year old, female patient evaluated today after her last encounter by our practice on 02/03/2019. Kimberly [name]  has a past medical history of Anxiety, Arthritis, Asthma, Balance problem, Brain cyst, Depression, Difficult intubation, Flu (08/10/2018), GERD (gastroesophageal reflux disease), Headache, Herpes,  History of kidney stones, History of pneumonia, Hypertension, Inflammation of shoulder joint, Memory difficulties, Neuromuscular disorder ([place]), Pneumothorax on right, PONV (postoperative nausea and vomiting), Recurrent falls, Shortness of breath dyspnea, and Urinary frequency. She also  has a past surgical history that includes Abdominal hysterectomy; Rotator cuff repair (Left); Foreign Body Removal (Left); Carpal tunnel release (Left, 02/16/2013); Carpal tunnel release (Right, 03/27/2013); Cesarean section; Anterior cervical decomp/discectomy fusion (N/A, 08/06/2014); Colonoscopy; Nasal sinus surgery (N/A, 04/13/2015); Shoulder arthroscopy with distal clavicle resection (Left, 05/18/2015); Shoulder acromioplasty (Left, 05/18/2015); Cholecystectomy (N/A, 06/17/2015); Neck surgery (2016); Anterior cervical decomp/discectomy fusion (N/A, 07/04/2016); Posterior cervical fusion/foraminotomy (N/A, 11/13/2016); Knee arthroscopy; and Extracorporeal shock wave lithotripsy (Left, 09/04/2018). Kimberly [name] has a current medication list which includes the following prescription(s): acetaminophen, alprazolam, amlodipine, cholecalciferol, clotrimazole, cyclobenzaprine, diazepam, diclofenac sodium, escitalopram, estradiol, liothyronine, meclizine, meloxicam, nortriptyline, oxycodone-acetaminophen, oxycodone-acetaminophen, oxycodone-acetaminophen, pantoprazole, prednisone, tetrahydrozoline hcl, valacyclovir, acyclovir, duloxetine, nystatin, and oxybutynin. She  reports that she has never smoked. She has never used smokeless tobacco. She reports that she does not drink alcohol or use drugs. Kimberly [name] is allergic to shellfish allergy; peanut-containing drug products; codeine; diphtheria toxoid; and tetanus toxoids.   HPI  Today, she is being contacted for medication management.   No change in medical history since last visit.  Patient's pain is at baseline.  Patient continues multimodal pain regimen as prescribed.  States that it  provides pain relief and improvement in functional status.  Patient is also started Lyrica 25 mg twice daily via Dr. Melrose Nakayama with neurology.  She is finding benefit with this medication.  I instructed her to continue.  Pharmacotherapy Assessment  Analgesic:  03/06/2019  3   02/03/2019  Oxycodone-Acetaminophen 10-325  45.00 30 Bi Lat   [id number]   Wal (1123)   0  22.50  MME  Medicare   [place]      Monitoring: Pharmacotherapy: No side-effects or adverse reactions reported. [place] PMP: PDMP reviewed during this encounter.       Compliance: No problems identified. Effectiveness: Clinically acceptable. Plan: Refer to "POC".  UDS:  Summary  Date Value Ref Range Status  02/03/2019 Note  Final    Comment:    ==================================================================== ToxASSURE Select 13 (MW) ==================================================================== Test                             Result       Flag       Units Drug Absent but Declared for Prescription Verification   Diazepam                       Not Detected UNEXPECTED ng/mg creat   Alprazolam                     Not Detected UNEXPECTED ng/mg creat   Oxycodone                      Not Detected UNEXPECTED ng/mg creat ==================================================================== Test                      Result    Flag   Units      Ref Range   Creatinine              234              mg/dL      >=20 ==================================================================== Declared Medications:  The flagging and interpretation on this report are based on the  following declared medications.  Unexpected results may arise from  inaccuracies in the declared medications.  **Note: The testing scope of this panel includes these medications:  Alprazolam  Diazepam  Oxycodone (Percocet)  **Note: The testing scope of this panel does not include the  following reported medications:  Acetaminophen  Acetaminophen (Percocet)   Acyclovir  Amlodipine  Cholecalciferol  Clotrimazole  Cyclobenzaprine  Diclofenac  Duloxetine  Escitalopram  Estradiol (Estrace)  Eye Drops  Gabapentin  Liothyronine  Meclizine  Meloxicam  Nortriptyline  Nystatin  Oxybutynin  Pantoprazole (Protonix)  Prednisone (Deltasone) ==================================================================== For clinical consultation, please call [phone]. ====================================================================    Laboratory Chemistry Profile (12 mo)  Renal: 08/04/2018: Creatinine, Ser 0.60  Lab Results  Component Value Date   GFRAA >60 07/15/2017   GFRNONAA >60 07/15/2017   Hepatic: No results found for requested labs within last 8760 hours. Lab Results  Component Value Date   AST 26 07/31/2015   ALT 17 07/31/2015   Other: No results found for requested labs within last 8760 hours. Note: Above Lab results reviewed.  Imaging  Last 90 days:  Abdomen 1 View (kub)  Result Date: 03/20/2019 CLINICAL DATA:  Nephrolithiasis EXAM: ABDOMEN - 1 VIEW COMPARISON:  09/29/2018 FINDINGS: No visible renal or ureteral stones. Calcified phleboliths in the lower pelvis. Prior cholecystectomy. Normal bowel gas pattern. No organomegaly. IMPRESSION: Negative. Electronically Signed   By: Rolm Baptise M.D.   On: 03/20/2019 16:53   Mr Brain Wo Contrast  Result Date: 03/20/2019 CLINICAL DATA:  Syncope, unspecified.  History of brain cyst EXAM: MRI HEAD WITHOUT CONTRAST TECHNIQUE: Multiplanar, multiecho pulse sequences of the brain and surrounding structures were obtained without intravenous contrast. COMPARISON:  Chest 02/24/2017 FINDINGS: Brain: No acute infarction, hemorrhage, hydrocephalus, extra-axial  collection or mass lesion. Dilated perivascular space below the left putamen, the presumed correlate for history of brain cyst. Small FLAIR hyperintensities in the cerebral white matter, overall mild in extent and stable from prior. No specific  demyelinating pattern. Negative for atrophy. Vascular: Major flow voids are preserved, including vertebrobasilar. Skull and upper cervical spine: Extensive cervical spine fusion. Negative for marrow lesion Sinuses/Orbits: Normal. IMPRESSION: 1. No acute or reversible finding and stable from 2018. 2. Mild white matter disease with nonspecific pattern. This may be chronic small vessel ischemia given history of hypertension. Electronically Signed   By: Monte Fantasia M.D.   On: 03/20/2019 21:53    Assessment  The primary encounter diagnosis was Chronic pain syndrome. Diagnoses of Cervical spondylosis without myelopathy, Cervical fusion syndrome (C2-T1), Degenerative disc disease, cervical, Hx of cervical spine surgery, Occipital headache, Bilateral occipital neuralgia, and Cervicalgia were also pertinent to this visit.  Plan of Care  I have discontinued Jamison T. Linenberger's gabapentin. I have also changed her oxyCODONE-acetaminophen. Additionally, I am having her start on oxyCODONE-acetaminophen and oxyCODONE-acetaminophen. Lastly, I am having her maintain her amLODipine, acyclovir, escitalopram, oxybutynin, clotrimazole, cyclobenzaprine, nortriptyline, pantoprazole, diazepam, acetaminophen, Tetrahydrozoline HCl (VISINE OP), ALPRAZolam, nystatin, Cholecalciferol, liothyronine, predniSONE, DULoxetine, diclofenac sodium, estradiol, meclizine, meloxicam, and valACYclovir.  Pharmacotherapy (Medications Ordered): Meds ordered this encounter  Medications  . oxyCODONE-acetaminophen (PERCOCET) 10-325 MG tablet    Sig: Take 1 tablet by mouth 2 (two) times daily as needed for pain. Must last 30 days. 45 tablets/month    Dispense:  45 tablet    Refill:  0    Chronic Pain. (STOP Act - Not applicable). Fill one day early if closed on scheduled refill date.  Marland Kitchen oxyCODONE-acetaminophen (PERCOCET) 10-325 MG tablet    Sig: Take 1 tablet by mouth 2 (two) times daily as needed for pain. Must last 30 days. 45  tablets/month    Dispense:  45 tablet    Refill:  0    Chronic Pain. (STOP Act - Not applicable). Fill one day early if closed on scheduled refill date.  Marland Kitchen oxyCODONE-acetaminophen (PERCOCET) 10-325 MG tablet    Sig: Take 1 tablet by mouth 2 (two) times daily as needed for pain. Must last 30 days. 45 tablets/month    Dispense:  45 tablet    Refill:  0    Chronic Pain. (STOP Act - Not applicable). Fill one day early if closed on scheduled refill date.   Follow-up plan:   Return in about 11 weeks (around 06/23/2019) for Medication Management, virtual.     Consider TPI, cervical facet medial branch nerve block, suprascapular nerve block    Recent Visits Date Type Provider Dept  02/03/19 Office Visit Gillis Santa, MD [facility]  Showing recent visits within past 90 days and meeting all other requirements   Today's Visits Date Type Provider Dept  04/07/19 Office Visit Gillis Santa, MD [facility]  Showing today's visits and meeting all other requirements   Future Appointments No visits were found meeting these conditions.  Showing future appointments within next 90 days and meeting all other requirements   I discussed the assessment and treatment plan with the patient. The patient was provided an opportunity to ask questions and all were answered. The patient agreed with the plan and demonstrated an understanding of the instructions.  Patient advised to call back or seek an in-person evaluation if the symptoms or condition worsens.  Total duration of non-face-to-face encounter: 60minutes.  Note by: Gillis Santa, MD  Date: 04/07/2019; Time: 2:57 PM  Note: This dictation was prepared with Dragon dictation. Any transcriptional errors that may result from this process are unintentional.  Disclaimer:  * Given the special circumstances of the COVID-19 pandemic, the federal government has announced that the Office for Civil Rights (OCR) will exercise its enforcement  discretion and will not impose penalties on physicians using telehealth in the event of noncompliance with regulatory requirements under the [place] (HIPAA) in connection with the good faith provision of telehealth during the XX123456 national public health emergency. ([place])

## 2019-04-09 DIAGNOSIS — Z87898 Personal history of other specified conditions: Secondary | ICD-10-CM | POA: Diagnosis not present

## 2019-04-09 DIAGNOSIS — G479 Sleep disorder, unspecified: Secondary | ICD-10-CM | POA: Diagnosis not present

## 2019-04-09 DIAGNOSIS — M542 Cervicalgia: Secondary | ICD-10-CM | POA: Diagnosis not present

## 2019-04-09 DIAGNOSIS — M5481 Occipital neuralgia: Secondary | ICD-10-CM | POA: Diagnosis not present

## 2019-04-10 ENCOUNTER — Other Ambulatory Visit: Payer: Self-pay

## 2019-04-10 ENCOUNTER — Ambulatory Visit
Admission: RE | Admit: 2019-04-10 | Discharge: 2019-04-10 | Disposition: A | Discharge status: Home / Self Care | Payer: Medicare Other | Source: Ambulatory Visit | Attending: Gastroenterology | Admitting: Gastroenterology

## 2019-04-10 DIAGNOSIS — K219 Gastro-esophageal reflux disease without esophagitis: Secondary | ICD-10-CM | POA: Diagnosis not present

## 2019-04-10 DIAGNOSIS — R1310 Dysphagia, unspecified: Secondary | ICD-10-CM | POA: Diagnosis not present

## 2019-04-10 NOTE — Therapy (Signed)
[place], [place], [address] Phone: [phone]   Fax:     Modified Barium Swallow  Patient Details  Name: Kimberly [name] MRN: [id number] Date of Birth: Mar 07, 1961 No data recorded  Encounter Date: 04/10/2019    Past Medical History:  Diagnosis Date  . Anxiety    takes alprazolam - rare use   . Arthritis    cervical spondylosis   . Asthma    SEES  PULM W/ [place]    . Balance problem   . Brain cyst   . Depression   . Difficult intubation    Small mouth opening, limited neck flexion, very anterior   . Flu 08/10/2018   tested positive  . GERD (gastroesophageal reflux disease)    pt. reports that its better, no meds in use at this time- 2015  . Headache   . Herpes   . History of kidney stones   . History of pneumonia   . Hypertension    pt. doesn't see cardiologist, followed for HTN by Dr. Gerarda Fraction  . Inflammation of shoulder joint   . Memory difficulties   . Neuromuscular disorder ([place])    joint and muscle problems  . Pneumothorax on right    following C4-6 ACDF 07/04/16  . PONV (postoperative nausea and vomiting)    WITH SURGERY 07/04/16 (NECK) RT LUNG COLLAPSED  . Recurrent falls   . Shortness of breath dyspnea    WITH EXERTION   . Urinary frequency     Past Surgical History:  Procedure Laterality Date  . ABDOMINAL HYSTERECTOMY    . ANTERIOR CERVICAL DECOMP/DISCECTOMY FUSION N/A 08/06/2014   Procedure: ANTERIOR CERVICAL DECOMPRESSION/DISCECTOMY FUSION CERVICAL THREE-FOUR,CERVICAL SIX-SEVEN ,CERVICAL SEVEN-THORACIC ONE;  Surgeon: Floyce Stakes, MD;  Location: [place];  Service: Neurosurgery;  Laterality: N/A;  . ANTERIOR CERVICAL DECOMP/DISCECTOMY FUSION N/A 07/04/2016   Procedure: CERVICAL FOUR-FIVE, CERVICAL FIVE-SIX ANTERIOR CERVICAL DECOMPRESSION/DISCECTOMY/FUSION WITH REVISION OF CERVICAL THREE-FOUR PLATE;  Surgeon: Leeroy Cha, MD;  Location: [place];  Service: Neurosurgery;   Laterality: N/A;  . CARPAL TUNNEL RELEASE Left 02/16/2013   Procedure: CARPAL TUNNEL RELEASE;  Surgeon: Carole Civil, MD;  Location: [facility];  Service: Orthopedics;  Laterality: Left;  . CARPAL TUNNEL RELEASE Right 03/27/2013   Procedure: RIGHT CARPAL TUNNEL RELEASE;  Surgeon: Carole Civil, MD;  Location: [facility];  Service: Orthopedics;  Laterality: Right;  . CESAREAN SECTION     x2  . CHOLECYSTECTOMY N/A 06/17/2015   Procedure: LAPAROSCOPIC CHOLECYSTECTOMY;  Surgeon: Aviva Signs, MD;  Location: [facility];  Service: General;  Laterality: N/A;  . COLONOSCOPY    . EXTRACORPOREAL SHOCK WAVE LITHOTRIPSY Left 09/04/2018   Procedure: EXTRACORPOREAL SHOCK WAVE LITHOTRIPSY (ESWL);  Surgeon: Billey Co, MD;  Location: [facility];  Service: Urology;  Laterality: Left;  . FOREIGN BODY REMOVAL Left    knee-as child  . KNEE ARTHROSCOPY    . NASAL SINUS SURGERY N/A 04/13/2015   Procedure: nasal endoscopy with adenoid biopsy;  Surgeon: Ruby Cola, MD;  Location: [facility];  Service: ENT;  Laterality: N/A;  . NECK SURGERY  2016   x 3 all together  . POSTERIOR CERVICAL FUSION/FORAMINOTOMY N/A 11/13/2016   Procedure: CERVICAL TWO-CERVICAL SIX POSTERIOR CERVICAL FUSION WITH LATERAL MASS FIXATION;  Surgeon: Kristeen Miss, MD;  Location: [place];  Service: Neurosurgery;  Laterality: N/A;  posterior approach  . ROTATOR CUFF REPAIR Left   . SHOULDER ACROMIOPLASTY Left 05/18/2015   Procedure: SHOULDER  ACROMIOPLASTY;  Surgeon: Earlie Server, MD;  Location: [place];  Service: Orthopedics;  Laterality: Left;  . SHOULDER ARTHROSCOPY WITH DISTAL CLAVICLE RESECTION Left 05/18/2015   Procedure: LEFT SHOULDER ARTHROSCOPY WITH  DISTAL CLAVICLE RESECTION;  Surgeon: Earlie Server, MD;  Location: [place];  Service: Orthopedics;  Laterality: Left;    There were no vitals filed for this visit.     Subjective: Patient behavior: (alertness, ability to follow instructions, etc.):   The patient is alert, able to verbalize her swallowing difficulty, and follow directions.  Chief complaint: the patient reports difficulty with pills getting stuck   Objective:  Radiological Procedure: A videoflouroscopic evaluation of oral-preparatory, reflex initiation, and pharyngeal phases of the swallow was performed; as well as a screening of the upper esophageal phase.  I. POSTURE: Upright in MBS chair  II. VIEW: Lateral  III. COMPENSATORY STRATEGIES: Water wash to promote cervical esophagus clearing  IV. BOLUSES ADMINISTERED:   Thin Liquid: 1 small, 3 rapid consecutive   Nectar-thick Liquid: 2 moderate   Honey-thick Liquid: DNT   Puree: 2 teaspoon presentations   Mechanical Soft: 1/4 graham cracker in applesauce   Barium tablet  V. RESULTS OF EVALUATION: A. ORAL PREPARATORY PHASE: (The lips, tongue, and velum are observed for strength and coordination)       **Overall Severity Rating: within normal limits   B. SWALLOW INITIATION/REFLEX: (The reflex is normal if "triggered" by the time the bolus reached the base of the tongue)  **Overall Severity Rating: within normal limits   C. PHARYNGEAL PHASE: (Pharyngeal function is normal if the bolus shows rapid, smooth, and continuous transit through the pharynx and there is no pharyngeal residue after the swallow)  **Overall Severity Rating: within normal limits   D. LARYNGEAL PENETRATION: (Material entering into the laryngeal inlet/vestibule but not aspirated) None  E. ASPIRATION: None  F. ESOPHAGEAL PHASE: (Screening of the upper esophagus) A barium tablet lodged momentarily within the cervical esophagus but moved through to the stomach with follow up swallows of water.    ASSESSMENT: This 58 year old woman; with difficulty swallowing pills; is presenting with functional oropharyngeal swallowing.  Oral control of the bolus including oral hold, rotary mastication, and anterior to posterior transfer is within functional  limits.   Aspects of the pharyngeal stage of swallowing including tongue base retraction, hyolaryngeal excursion, epiglottic inversion, and duration/amplitude of UES opening are within normal limits.  There is no observed pharyngeal residue, laryngeal penetration, or tracheal aspiration.  A barium tablet lodged momentarily within the cervical esophagus but moved through to the stomach with follow up swallows of water.  The patient has had extensive cervical fusion extending from C2 though T1, which may have an impact on esophageal clearance.  The patient was reassured that her swallowing is safe and she should continue to take medication with plenty of water to aid esophageal clearance.  PLAN/RECOMMENDATIONS:  A. Diet: Regular - soften and moisten as needed   B. Swallowing Precautions: Take medication with plenty of water   C. Recommended consultation to: follow up with MDs as recommended   D. Therapy recommendations: speech therapy is not indicated   E. Results and recommendations were discussed with the patient immediately following the study and the final report routed to the referring provider.   Dysphagia, unspecified type - Plan: DG SWALLOW FUNC OP MEDICARE SPEECH PATH, DG SWALLOW FUNC OP MEDICARE SPEECH PATH        Problem List Patient Active Problem List   Diagnosis Date  Noted  . Recurrent herpes simplex 07/16/2018  . Pelvic pain in female 05/29/2018  . Chronic pain syndrome 04/17/2018  . Cervicalgia 04/17/2018  . Controlled substance agreement signed 04/17/2018  . Cervical fusion syndrome 03/24/2018  . Hx of cervical spine surgery 03/24/2018  . Depression, major, recurrent, mild ([place]) 11/21/2017  . Poorly-controlled hypertension 11/21/2017  . Depression, major, single episode, complete remission ([place]) 10/24/2017  . Degenerative disc disease, cervical 08/28/2017  . Vasovagal syncope 08/28/2017  . Dizziness 08/22/2017  . History of recent fall 08/22/2017  . Hypothyroidism  (acquired) 08/13/2017  . Loss of memory 04/30/2017  . Reaction to severe stress 02/14/2017  . Pseudoarthrosis of cervical spine ([place]) 11/13/2016  . Asthma 07/05/2016  . Acute chest wall pain 07/05/2016  . GERD (gastroesophageal reflux disease) 07/05/2016  . Spontaneous pneumothorax   . Medication overuse headache 04/19/2016  . Chronic tension-type headache, not intractable 04/19/2016  . Numbness and tingling 04/19/2016  . Dysphagia 03/07/2016  . Vitamin D deficiency 02/07/2016  . Bilateral hand pain 02/06/2016  . Cervical stenosis of spinal canal 08/06/2014  . Muscle weakness (generalized) 11/12/2013  . Tight fascia 11/12/2013  . Decreased range of motion of shoulder 11/12/2013  . Cervical spondylosis without myelopathy 10/29/2013  . S/P carpal tunnel release 02/19/2013  . CTS (carpal tunnel syndrome) 02/19/2013  . SHOULDER PAIN 10/06/2007  . IMPINGEMENT SYNDROME 10/06/2007  . RUPTURE ROTATOR CUFF 10/06/2007  . HTN (hypertension) 10/03/2007   Leroy Sea, MS/CCC- SLP  Lou Miner 04/10/2019, 1:57 PM  [place], [place], [address] Phone: [phone]   Fax:     Name: CINTYA FORGIE MRN: [id number] Date of Birth: 1961-06-19

## 2019-04-24 DIAGNOSIS — Z86010 Personal history of colonic polyps: Secondary | ICD-10-CM | POA: Diagnosis not present

## 2019-04-24 DIAGNOSIS — K210 Gastro-esophageal reflux disease with esophagitis: Secondary | ICD-10-CM | POA: Diagnosis not present

## 2019-05-01 ENCOUNTER — Other Ambulatory Visit: Payer: Self-pay

## 2019-05-01 ENCOUNTER — Other Ambulatory Visit
Admission: RE | Admit: 2019-05-01 | Discharge: 2019-05-01 | Disposition: A | Discharge status: Home / Self Care | Payer: Medicare Other | Source: Ambulatory Visit | Attending: General Surgery | Admitting: General Surgery

## 2019-05-01 DIAGNOSIS — Z20828 Contact with and (suspected) exposure to other viral communicable diseases: Secondary | ICD-10-CM | POA: Diagnosis not present

## 2019-05-01 DIAGNOSIS — Z01812 Encounter for preprocedural laboratory examination: Secondary | ICD-10-CM | POA: Insufficient documentation

## 2019-05-01 LAB — SARS CORONAVIRUS 2 (TAT 6-24 HRS): SARS Coronavirus 2: NEGATIVE

## 2019-05-04 DIAGNOSIS — R0789 Other chest pain: Secondary | ICD-10-CM | POA: Diagnosis not present

## 2019-05-04 DIAGNOSIS — N62 Hypertrophy of breast: Secondary | ICD-10-CM | POA: Diagnosis not present

## 2019-05-06 ENCOUNTER — Ambulatory Visit: Payer: Medicare Other | Admitting: Certified Registered Nurse Anesthetist

## 2019-05-06 ENCOUNTER — Encounter
Admission: RE | Disposition: A | Discharge status: Home / Self Care | Payer: Self-pay | Source: Home / Self Care | Attending: *Deleted

## 2019-05-06 ENCOUNTER — Other Ambulatory Visit: Payer: Self-pay

## 2019-05-06 ENCOUNTER — Ambulatory Visit
Admission: RE | Admit: 2019-05-06 | Discharge: 2019-05-06 | Disposition: A | Discharge status: Home / Self Care | Payer: Medicare Other | Attending: General Surgery | Admitting: General Surgery

## 2019-05-06 DIAGNOSIS — Z791 Long term (current) use of non-steroidal anti-inflammatories (NSAID): Secondary | ICD-10-CM | POA: Diagnosis not present

## 2019-05-06 DIAGNOSIS — Z1211 Encounter for screening for malignant neoplasm of colon: Secondary | ICD-10-CM | POA: Diagnosis not present

## 2019-05-06 DIAGNOSIS — Q439 Congenital malformation of intestine, unspecified: Secondary | ICD-10-CM | POA: Diagnosis not present

## 2019-05-06 DIAGNOSIS — K635 Polyp of colon: Secondary | ICD-10-CM | POA: Diagnosis not present

## 2019-05-06 DIAGNOSIS — Z79890 Hormone replacement therapy (postmenopausal): Secondary | ICD-10-CM | POA: Diagnosis not present

## 2019-05-06 DIAGNOSIS — D120 Benign neoplasm of cecum: Secondary | ICD-10-CM | POA: Diagnosis not present

## 2019-05-06 DIAGNOSIS — I10 Essential (primary) hypertension: Secondary | ICD-10-CM | POA: Insufficient documentation

## 2019-05-06 DIAGNOSIS — Z09 Encounter for follow-up examination after completed treatment for conditions other than malignant neoplasm: Secondary | ICD-10-CM | POA: Insufficient documentation

## 2019-05-06 DIAGNOSIS — Z8719 Personal history of other diseases of the digestive system: Secondary | ICD-10-CM | POA: Insufficient documentation

## 2019-05-06 DIAGNOSIS — Z86010 Personal history of colonic polyps: Secondary | ICD-10-CM | POA: Diagnosis not present

## 2019-05-06 DIAGNOSIS — F419 Anxiety disorder, unspecified: Secondary | ICD-10-CM | POA: Diagnosis not present

## 2019-05-06 DIAGNOSIS — K219 Gastro-esophageal reflux disease without esophagitis: Secondary | ICD-10-CM | POA: Insufficient documentation

## 2019-05-06 DIAGNOSIS — Z79899 Other long term (current) drug therapy: Secondary | ICD-10-CM | POA: Diagnosis not present

## 2019-05-06 DIAGNOSIS — F329 Major depressive disorder, single episode, unspecified: Secondary | ICD-10-CM | POA: Insufficient documentation

## 2019-05-06 HISTORY — PX: COLONOSCOPY WITH PROPOFOL: SHX5780

## 2019-05-06 HISTORY — DX: Dyspnea, unspecified: R06.00

## 2019-05-06 SURGERY — COLONOSCOPY WITH PROPOFOL
Anesthesia: General

## 2019-05-06 MED ORDER — PROPOFOL 500 MG/50ML IV EMUL
INTRAVENOUS | Status: DC | PRN
  Administered 2019-05-06: 175 ug/kg/min via INTRAVENOUS

## 2019-05-06 MED ORDER — LIDOCAINE HCL (PF) 1 % IJ SOLN
INTRAMUSCULAR | Status: AC
  Filled 2019-05-06: qty 2

## 2019-05-06 MED ORDER — PROPOFOL 10 MG/ML IV BOLUS
INTRAVENOUS | Status: DC | PRN
  Administered 2019-05-06: 10 mg via INTRAVENOUS
  Administered 2019-05-06: 60 mg via INTRAVENOUS

## 2019-05-06 MED ORDER — SODIUM CHLORIDE 0.9 % IV SOLN
INTRAVENOUS | Status: DC | PRN
  Administered 2019-05-06: 10:00:00 via INTRAVENOUS

## 2019-05-06 MED ORDER — LIDOCAINE HCL (CARDIAC) PF 100 MG/5ML IV SOSY
PREFILLED_SYRINGE | INTRAVENOUS | Status: DC | PRN
  Administered 2019-05-06: 50 mg via INTRAVENOUS

## 2019-05-06 MED ORDER — SODIUM CHLORIDE 0.9 % IV SOLN: INTRAVENOUS | Status: DC

## 2019-05-06 MED ORDER — PROPOFOL 500 MG/50ML IV EMUL
INTRAVENOUS | Status: AC
  Filled 2019-05-06: qty 50

## 2019-05-06 NOTE — Op Note (Addendum)
[facility] Patient Name: Kimberly [name] Procedure Date: 05/06/2019 9:47 AM MRN: [id number] Account #: [id number] Date of Birth: 07-Oct-1960 Admit Type: Outpatient Age: 58 Room: [facility] ENDO ROOM 1 Gender: Female Note Status: Supervisor Override Instrument Name: Jasper Riling [id number] Procedure:             Colonoscopy Indications:           High risk colon cancer surveillance: Personal history                         of colonic polyps Providers:             Robert Bellow, MD Referring MD:          Dion Body (Referring MD) Medicines:             Monitored Anesthesia Care Complications:         No immediate complications. Procedure:             Pre-Anesthesia Assessment:                        - Prior to the procedure, a History and Physical was                         performed, and patient medications, allergies and                         sensitivities were reviewed. The patient's tolerance                         of previous anesthesia was reviewed.                        - The risks and benefits of the procedure and the                         sedation options and risks were discussed with the                         patient. All questions were answered and informed                         consent was obtained.                        After obtaining informed consent, the colonoscope was                         passed under direct vision. Throughout the procedure,                         the patient's blood pressure, pulse, and oxygen                         saturations were monitored continuously. The                         Colonoscope was introduced through the anus and                         advanced to  the the cecum, identified by appendiceal                         orifice and ileocecal valve. The colonoscopy was                         somewhat difficult due to restricted mobility of the                         colon. Successful  completion of the procedure was                         aided by changing the patient to a supine position.                         The patient tolerated the procedure well. The quality                         of the bowel preparation was excellent. Some                         tortuosity in the sigmoid resolved by rolling the                         patient supine. Findings:      A 10 mm polyp was found in the cecum. The polyp was sessile. The polyp       was removed with a hot snare. Resection and retrieval were complete.      The retroflexed view of the distal rectum and anal verge was normal and       showed no anal or rectal abnormalities. Impression:            - One 10 mm polyp in the cecum, removed with a hot                         snare. Resected and retrieved.                        - The distal rectum and anal verge are normal on                         retroflexion view. Recommendation:        - Telephone endoscopist for pathology results in 1                         week. Procedure Code(s):     --- Professional ---                        [phone], Colonoscopy, flexible; with removal of                         tumor(s), polyp(s), or other lesion(s) by snare                         technique Diagnosis Code(s):     --- Professional ---  Z86.010, Personal history of colonic polyps                        K63.5, Polyp of colon CPT copyright 2019 American Medical Association. All rights reserved. The codes documented in this report are preliminary and upon coder review may  be revised to meet current compliance requirements. Robert Bellow, MD 05/06/2019 10:36:25 AM This report has been signed electronically. Number of Addenda: 0 Note Initiated On: 05/06/2019 9:47 AM Scope Withdrawal Time: 0 hours 14 minutes 3 seconds  Total Procedure Duration: 0 hours 25 minutes 37 seconds       [facility]

## 2019-05-06 NOTE — Anesthesia Postprocedure Evaluation (Signed)
Anesthesia Post Note  Patient: MIREL SURRATT  Procedure(s) Performed: COLONOSCOPY WITH PROPOFOL (N/A )  Patient location during evaluation: Endoscopy Anesthesia Type: General Level of consciousness: awake and alert Pain management: pain level controlled Vital Signs Assessment: post-procedure vital signs reviewed and stable Respiratory status: spontaneous breathing, nonlabored ventilation, respiratory function stable and patient connected to nasal cannula oxygen Cardiovascular status: blood pressure returned to baseline and stable Postop Assessment: no apparent nausea or vomiting Anesthetic complications: no     Last Vitals:  Vitals:   05/06/19 1058 05/06/19 1108  BP: 109/80 111/87  Pulse: 86 84  Resp: (!) 23 18  Temp:    SpO2: 92% 95%    Last Pain:  Vitals:   05/06/19 1108  TempSrc:   PainSc: 0-No pain                 Martha Clan

## 2019-05-06 NOTE — Anesthesia Post-op Follow-up Note (Signed)
Anesthesia QCDR form completed.        

## 2019-05-06 NOTE — Transfer of Care (Signed)
Immediate Anesthesia Transfer of Care Note  Patient: Kimberly [name]  Procedure(s) Performed: COLONOSCOPY WITH PROPOFOL (N/A )  Patient Location: PACU  Anesthesia Type:General  Level of Consciousness: drowsy  Airway & Oxygen Therapy: Patient Spontanous Breathing  Post-op Assessment: Report given to RN and Post -op Vital signs reviewed and stable  Post vital signs: Reviewed and stable  Last Vitals:  Vitals Value Taken Time  BP 112/76 05/06/19 1038  Temp 35.8 C 05/06/19 1038  Pulse 106 05/06/19 1039  Resp 26 05/06/19 1039  SpO2 95 % 05/06/19 1039  Vitals shown include unvalidated device data.  Last Pain:  Vitals:   05/06/19 1038  TempSrc: Tympanic  PainSc: Asleep         Complications: No apparent anesthesia complications

## 2019-05-06 NOTE — Anesthesia Preprocedure Evaluation (Signed)
Anesthesia Evaluation  Patient identified by MRN, date of birth, ID band Patient awake    Reviewed: Allergy & Precautions, H&P , NPO status , Patient's Chart, lab work & pertinent test results, reviewed documented beta blocker date and time   History of Anesthesia Complications (+) PONV, DIFFICULT AIRWAY and history of anesthetic complications  Airway Mallampati: II  TM Distance: >3 FB Neck ROM: full    Dental  (+) Dental Advidsory Given   Pulmonary shortness of breath and with exertion, asthma (as a child) , neg sleep apnea, neg recent URI,    Pulmonary exam normal        Cardiovascular Exercise Tolerance: Good hypertension, (-) angina(-) Past MI and (-) Cardiac Stents Normal cardiovascular exam(-) dysrhythmias (-) Valvular Problems/Murmurs     Neuro/Psych  Headaches, neg Seizures PSYCHIATRIC DISORDERS Anxiety Depression  Neuromuscular disease    GI/Hepatic Neg liver ROS, GERD  ,  Endo/Other  neg diabetesHypothyroidism   Renal/GU negative Renal ROS  negative genitourinary   Musculoskeletal   Abdominal   Peds  Hematology negative hematology ROS (+)   Anesthesia Other Findings Past Medical History: No date: Anxiety     Comment:  takes alprazolam - rare use  No date: Arthritis     Comment:  cervical spondylosis  No date: Asthma No date: Balance problem No date: Brain cyst No date: Depression No date: Difficult intubation     Comment:  Small mouth opening, limited neck flexion, very anterior No date: Dyspnea 08/10/2018: Flu     Comment:  tested positive No date: GERD (gastroesophageal reflux disease)     Comment:  pt. reports that its better, no meds in use at this               time- 2015 No date: Headache No date: Herpes No date: History of kidney stones No date: History of pneumonia No date: Hypertension     Comment:  pt. doesn't see cardiologist, followed for HTN by Dr.               Gerarda Fraction No date:  Inflammation of shoulder joint No date: Memory difficulties No date: Neuromuscular disorder [facility])     Comment:  joint and muscle problems No date: Pneumothorax on right     Comment:  following C4-6 ACDF 07/04/16 No date: PONV (postoperative nausea and vomiting)     Comment:  WITH SURGERY 07/04/16 (NECK) RT LUNG COLLAPSED No date: Recurrent falls No date: Urinary frequency   Reproductive/Obstetrics negative OB ROS                             Anesthesia Physical Anesthesia Plan  ASA: III  Anesthesia Plan: General   Post-op Pain Management:    Induction: Intravenous  PONV Risk Score and Plan: 4 or greater and Propofol infusion and TIVA  Airway Management Planned: Natural Airway and Nasal Cannula  Additional Equipment:   Intra-op Plan:   Post-operative Plan:   Informed Consent: I have reviewed the patients History and Physical, chart, labs and discussed the procedure including the risks, benefits and alternatives for the proposed anesthesia with the patient or authorized representative who has indicated his/her understanding and acceptance.     Dental Advisory Given  Plan Discussed with: Anesthesiologist, CRNA and Surgeon  Anesthesia Plan Comments:         Anesthesia Quick Evaluation

## 2019-05-06 NOTE — H&P (Signed)
Kimberly [name] DX:[id number] 04/26/1961     HPI:  58 year old woman with a history of colonic polyps in the past at [facility].  Screening exam by history, but polyps were identified.  Pathology not available.  For repeat exam. Tolerated the prep well.     Medications Prior to Admission  Medication Sig Dispense Refill Last Dose  . acyclovir (ZOVIRAX) 400 MG tablet Take 400 mg by mouth 2 (two) times daily.    Past Week at Unknown time  . amLODipine (NORVASC) 5 MG tablet Take 5 mg by mouth daily.    05/06/2019 at 0730  . cyclobenzaprine (FLEXERIL) 10 MG tablet Take 1 tablet by mouth as needed for muscle spasms.    Past Week at Unknown time  . escitalopram (LEXAPRO) 10 MG tablet Take 10 mg by mouth daily as needed (anxiety).    Past Week at Unknown time  . estradiol (ESTRACE) 0.5 MG tablet Take 0.5 mg by mouth daily.   Past Week at Unknown time  . liothyronine (CYTOMEL) 5 MCG tablet Take 1 tablet by mouth daily.   Past Week at Unknown time  . meloxicam (MOBIC) 7.5 MG tablet Take by mouth.   Past Week at Unknown time  . pantoprazole (PROTONIX) 40 MG tablet Take 40 mg by mouth daily.   Past Month at Unknown time  . Tetrahydrozoline HCl (VISINE OP) Apply 1 drop to eye daily as needed (irritation).   Past Week at Unknown time  . valACYclovir (VALTREX) 500 MG tablet Take 500 mg by mouth 2 (two) times daily.   Past Week at Unknown time  . acetaminophen (TYLENOL) 500 MG tablet Take 500 mg by mouth 2 (two) times daily as needed for headache.     . ALPRAZolam (XANAX) 0.5 MG tablet Take 0.5 mg by mouth 3 (three) times daily as needed for anxiety.     . Cholecalciferol (VITAMIN D-1000 MAX ST) 1000 units tablet Take 1,000 Units by mouth daily.     . clotrimazole (LOTRIMIN) 1 % cream Apply 1 application topically daily as needed (itching).      . diazepam (VALIUM) 10 MG tablet Take 10 mg by mouth daily as needed for anxiety.   Not Taking at Unknown time  . diclofenac sodium (VOLTAREN) 1 % GEL Apply  topically.     . DULoxetine (CYMBALTA) 30 MG capsule Take 1 capsule (30 mg total) by mouth daily. 90 capsule 0   . meclizine (ANTIVERT) 12.5 MG tablet TAKE 1 TABLET BY MOUTH THREE TIMES DAILY AS NEEDED FOR DIZZINESS FOR UP TO 10 DAYS.     Marland Kitchen nortriptyline (PAMELOR) 10 MG capsule Take 1 capsule by mouth at bedtime as needed for sleep.    Not Taking at Unknown time  . nystatin (MYCOSTATIN) 100000 UNIT/ML suspension Take 100,000 Units by mouth daily. Swish and spit  0   . oxybutynin (DITROPAN-XL) 5 MG 24 hr tablet Take 5 mg by mouth 2 (two) times daily.      Marland Kitchen oxyCODONE-acetaminophen (PERCOCET) 10-325 MG tablet Take 1 tablet by mouth 2 (two) times daily as needed for pain. Must last 30 days. 45 tablets/month (Patient not taking: Reported on 05/06/2019) 45 tablet 0 Not Taking at Unknown time  . [START ON 05/07/2019] oxyCODONE-acetaminophen (PERCOCET) 10-325 MG tablet Take 1 tablet by mouth 2 (two) times daily as needed for pain. Must last 30 days. 45 tablets/month 45 tablet 0   . [START ON 06/06/2019] oxyCODONE-acetaminophen (PERCOCET) 10-325 MG tablet Take 1 tablet  by mouth 2 (two) times daily as needed for pain. Must last 30 days. 45 tablets/month 45 tablet 0   . predniSONE (DELTASONE) 10 MG tablet 40mg  x 2 days, 30mg  x 2days, 20mg  x 2days, 10mg  x 2days   Not Taking at Unknown time   Allergies  Allergen Reactions  . Shellfish Allergy Anaphylaxis and Hives    "Swells throat up"  . Peanut-Containing Drug Products Hives  . Codeine Other (See Comments)    jitters  . Diphtheria Toxoid Rash  . Tetanus Toxoids Rash   Past Medical History:  Diagnosis Date  . Anxiety    takes alprazolam - rare use   . Arthritis    cervical spondylosis   . Asthma   . Balance problem   . Brain cyst   . Depression   . Difficult intubation    Small mouth opening, limited neck flexion, very anterior   . Dyspnea   . Flu 08/10/2018   tested positive  . GERD (gastroesophageal reflux disease)    pt. reports that its  better, no meds in use at this time- 2015  . Headache   . Herpes   . History of kidney stones   . History of pneumonia   . Hypertension    pt. doesn't see cardiologist, followed for HTN by Dr. Gerarda Fraction  . Inflammation of shoulder joint   . Memory difficulties   . Neuromuscular disorder ([place])    joint and muscle problems  . Pneumothorax on right    following C4-6 ACDF 07/04/16  . PONV (postoperative nausea and vomiting)    WITH SURGERY 07/04/16 (NECK) RT LUNG COLLAPSED  . Recurrent falls   . Urinary frequency    Past Surgical History:  Procedure Laterality Date  . ABDOMINAL HYSTERECTOMY    . ANTERIOR CERVICAL DECOMP/DISCECTOMY FUSION N/A 08/06/2014   Procedure: ANTERIOR CERVICAL DECOMPRESSION/DISCECTOMY FUSION CERVICAL THREE-FOUR,CERVICAL SIX-SEVEN ,CERVICAL SEVEN-THORACIC ONE;  Surgeon: Floyce Stakes, MD;  Location: [place];  Service: Neurosurgery;  Laterality: N/A;  . ANTERIOR CERVICAL DECOMP/DISCECTOMY FUSION N/A 07/04/2016   Procedure: CERVICAL FOUR-FIVE, CERVICAL FIVE-SIX ANTERIOR CERVICAL DECOMPRESSION/DISCECTOMY/FUSION WITH REVISION OF CERVICAL THREE-FOUR PLATE;  Surgeon: Leeroy Cha, MD;  Location: [place];  Service: Neurosurgery;  Laterality: N/A;  . CARPAL TUNNEL RELEASE Left 02/16/2013   Procedure: CARPAL TUNNEL RELEASE;  Surgeon: Carole Civil, MD;  Location: [facility];  Service: Orthopedics;  Laterality: Left;  . CARPAL TUNNEL RELEASE Right 03/27/2013   Procedure: RIGHT CARPAL TUNNEL RELEASE;  Surgeon: Carole Civil, MD;  Location: [facility];  Service: Orthopedics;  Laterality: Right;  . CESAREAN SECTION     x2  . CHOLECYSTECTOMY N/A 06/17/2015   Procedure: LAPAROSCOPIC CHOLECYSTECTOMY;  Surgeon: Aviva Signs, MD;  Location: [facility];  Service: General;  Laterality: N/A;  . COLONOSCOPY    . EXTRACORPOREAL SHOCK WAVE LITHOTRIPSY Left 09/04/2018   Procedure: EXTRACORPOREAL SHOCK WAVE LITHOTRIPSY (ESWL);  Surgeon: Billey Co, MD;  Location: [facility];  Service: Urology;   Laterality: Left;  . FOREIGN BODY REMOVAL Left    knee-as child  . KNEE ARTHROSCOPY    . NASAL SINUS SURGERY N/A 04/13/2015   Procedure: nasal endoscopy with adenoid biopsy;  Surgeon: Ruby Cola, MD;  Location: [facility];  Service: ENT;  Laterality: N/A;  . NECK SURGERY  2016   x 3 all together  . POSTERIOR CERVICAL FUSION/FORAMINOTOMY N/A 11/13/2016   Procedure: CERVICAL TWO-CERVICAL SIX POSTERIOR CERVICAL FUSION WITH LATERAL MASS FIXATION;  Surgeon: Kristeen Miss, MD;  Location: [place];  Service: Neurosurgery;  Laterality: N/A;  posterior approach  . ROTATOR CUFF REPAIR Left   . SHOULDER ACROMIOPLASTY Left 05/18/2015   Procedure: SHOULDER ACROMIOPLASTY;  Surgeon: Earlie Server, MD;  Location: [place];  Service: Orthopedics;  Laterality: Left;  . SHOULDER ARTHROSCOPY WITH DISTAL CLAVICLE RESECTION Left 05/18/2015   Procedure: LEFT SHOULDER ARTHROSCOPY WITH  DISTAL CLAVICLE RESECTION;  Surgeon: Earlie Server, MD;  Location: [place];  Service: Orthopedics;  Laterality: Left;   Social History   Socioeconomic History  . Marital status: Married    Spouse name: Vernard Gambles  . Number of children: Not on file  . Years of education: Not on file  . Highest education level: Not on file  Occupational History    Comment: disabled  Social Needs  . Financial resource strain: Not on file  . Food insecurity    Worry: Not on file    Inability: Not on file  . Transportation needs    Medical: Not on file    Non-medical: Not on file  Tobacco Use  . Smoking status: Never Smoker  . Smokeless tobacco: Never Used  Substance and Sexual Activity  . Alcohol use: No  . Drug use: No  . Sexual activity: Yes    Birth control/protection: Surgical  Lifestyle  . Physical activity    Days per week: Not on file    Minutes per session: Not on file  . Stress: Not on file  Relationships  . Social Herbalist on phone: Not on file    Gets together: Not on file     Attends religious service: Not on file    Active member of club or organization: Not on file    Attends meetings of clubs or organizations: Not on file    Relationship status: Not on file  . Intimate partner violence    Fear of current or ex partner: Not on file    Emotionally abused: Not on file    Physically abused: Not on file    Forced sexual activity: Not on file  Other Topics Concern  . Not on file  Social History Narrative  . Not on file   Social History   Social History Narrative  . Not on file     ROS: Negative.     PE: HEENT: Negative. Lungs: Clear. Cardio: RR. ABD: Vertical hypogastric scar.   Assessment/Plan:  Proceed with planned endoscopy. Forest Gleason Yakov Bergen 05/06/2019

## 2019-05-07 ENCOUNTER — Encounter: Payer: Self-pay | Admitting: General Surgery

## 2019-05-07 LAB — SURGICAL PATHOLOGY

## 2019-05-13 DIAGNOSIS — R0789 Other chest pain: Secondary | ICD-10-CM | POA: Diagnosis not present

## 2019-05-25 DIAGNOSIS — M5481 Occipital neuralgia: Secondary | ICD-10-CM | POA: Diagnosis not present

## 2019-05-25 DIAGNOSIS — R519 Headache, unspecified: Secondary | ICD-10-CM | POA: Diagnosis not present

## 2019-05-25 DIAGNOSIS — M542 Cervicalgia: Secondary | ICD-10-CM | POA: Diagnosis not present

## 2019-05-25 DIAGNOSIS — Z87898 Personal history of other specified conditions: Secondary | ICD-10-CM | POA: Diagnosis not present

## 2019-05-26 IMAGING — CT CT CERVICAL SPINE W/O CM
4 series · 15 of 33 positions shown, 18 images · non-contrast
Comparison: CT myelogram 05/11/2016.

CLINICAL DATA: Status post posterior cervical fusion. Severe neck
pain.

EXAM:
CT CERVICAL SPINE WITHOUT CONTRAST
TECHNIQUE: Multidetector CT imaging of the cervical spine was performed without
intravenous contrast. Multiplanar CT image reconstructions were also
generated.

[Series 2: cspine soft · axial · 0.28mm/px · z∈[+694,+718]mm · 2 of 75 slices shown]
[im 13/75  soft-tissue]
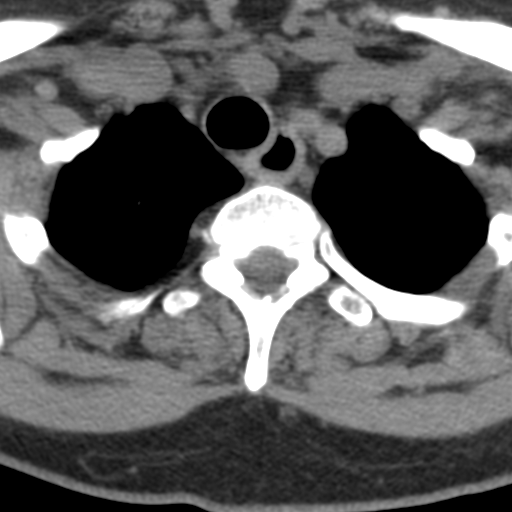
[im 25/75  soft-tissue]
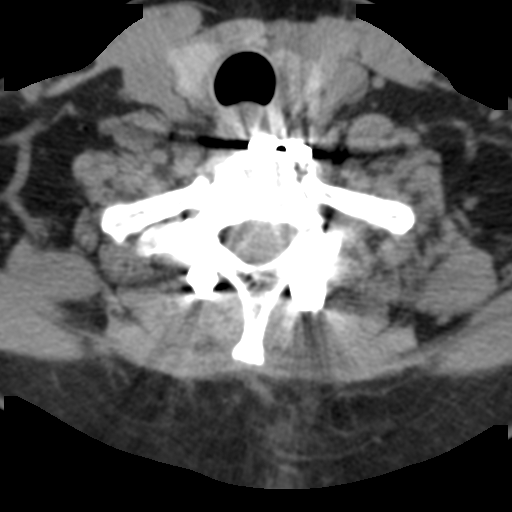

[Series 6: cor · coronal · 0.28mm/px · 3 of 55 slices shown]
[im 11/55  bone]
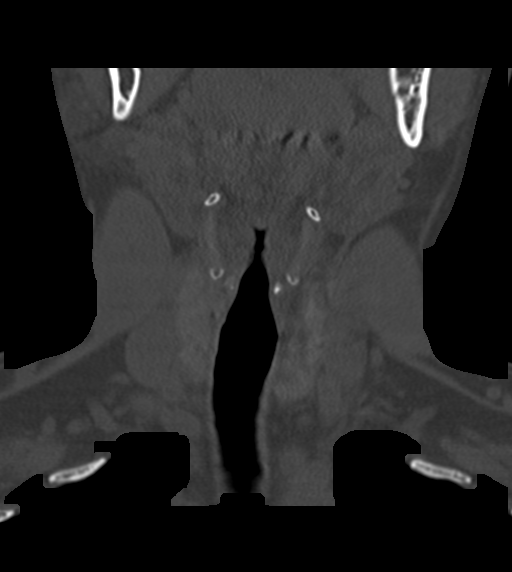
[im 22/55  bone]
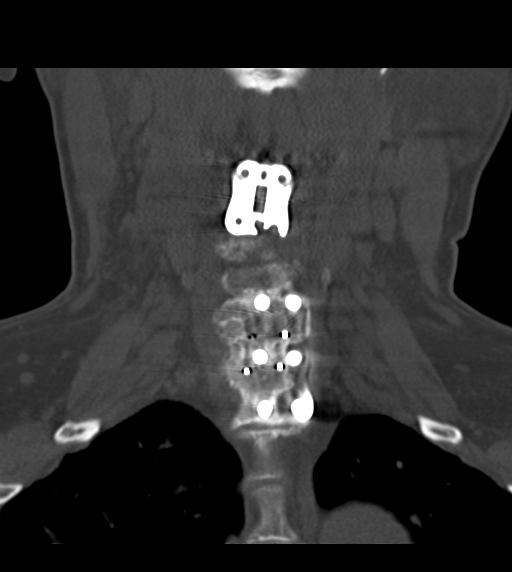
[im 33/55  bone]
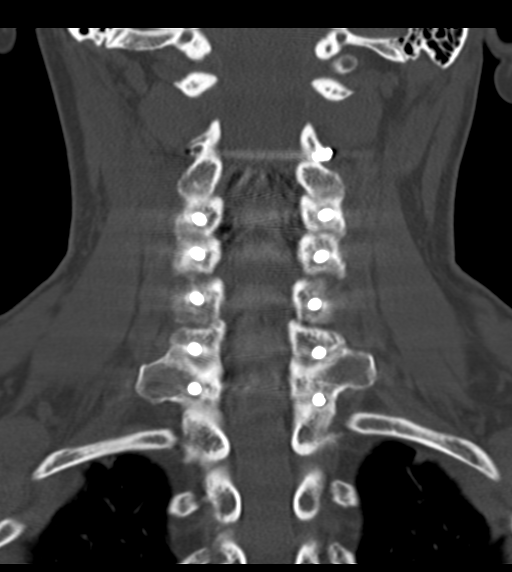

[Series 7: sag · sagittal · 0.29mm/px · 5 of 65 slices shown, 6 images]
[im 22/65  bone]
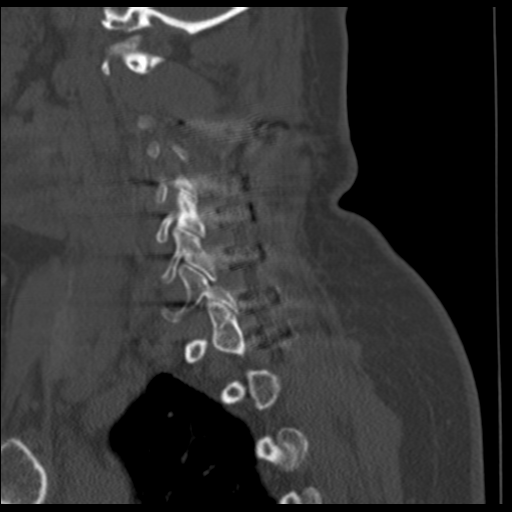
[im 27/65  bone]
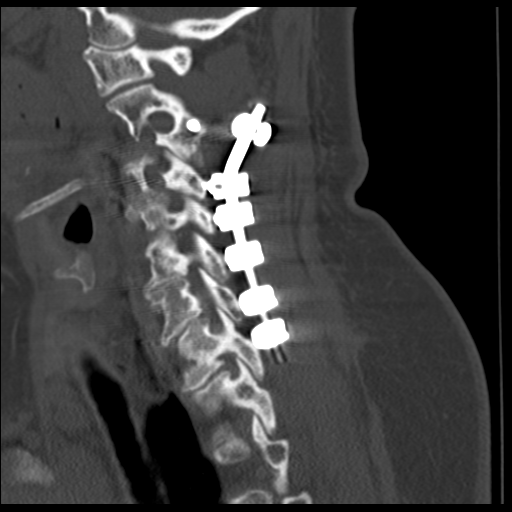
[im 33/65  soft-tissue]
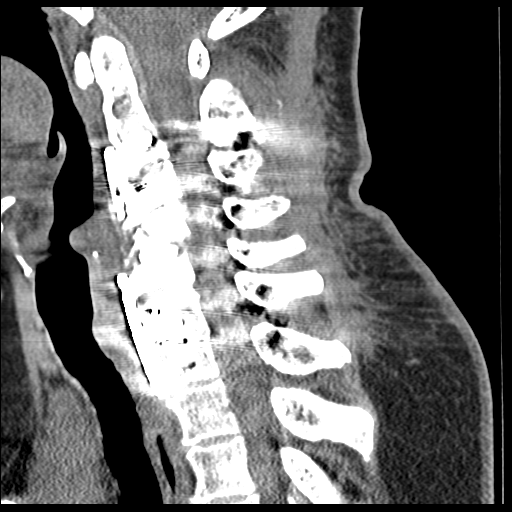
[im 33/65  bone]
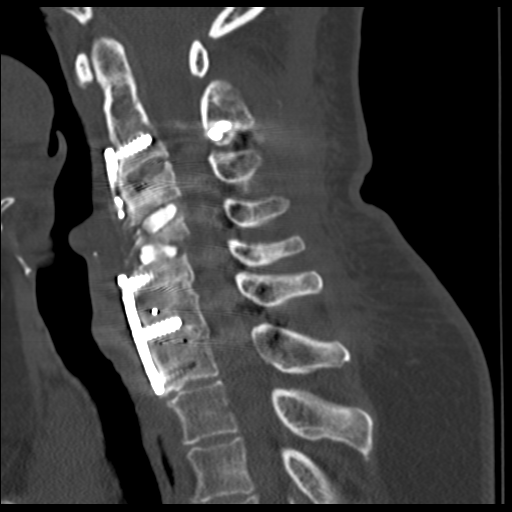
[im 38/65  bone]
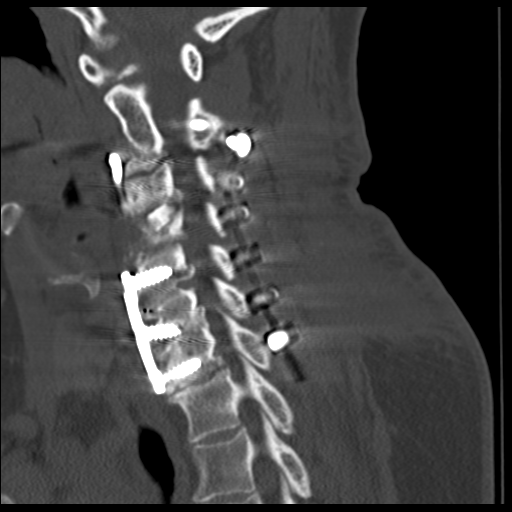
[im 43/65  bone]
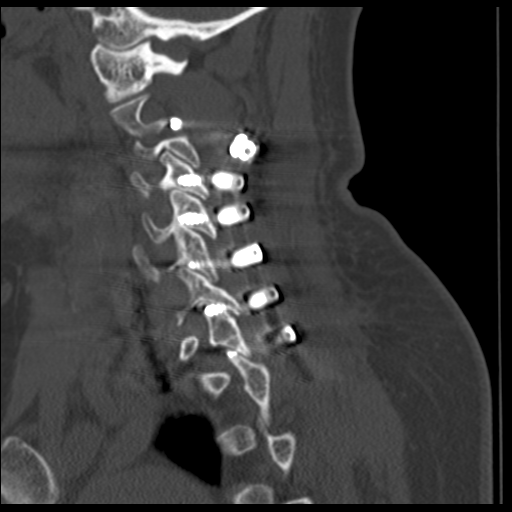

[Series 8: angled axial · axial · 0.28mm/px · z∈[+678,+779]mm · 5 of 79 slices shown, 7 images]
[im 14/79  soft-tissue]
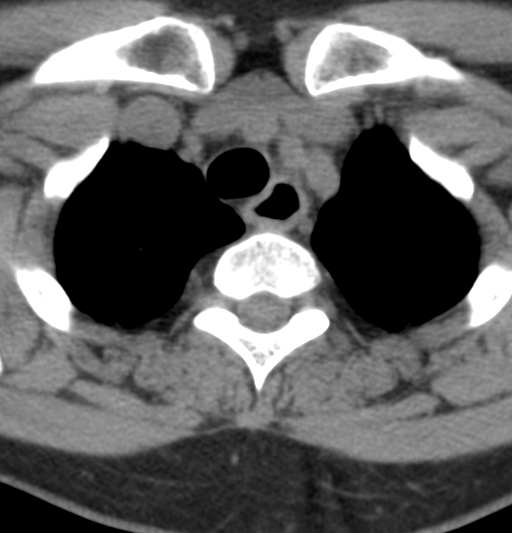
[im 14/79  bone]
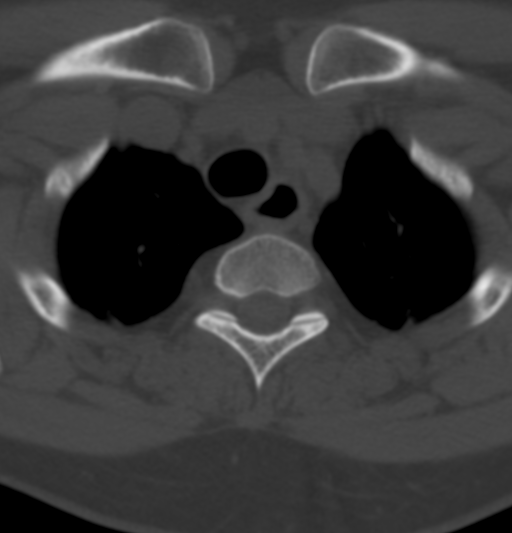
[im 27/79  bone]
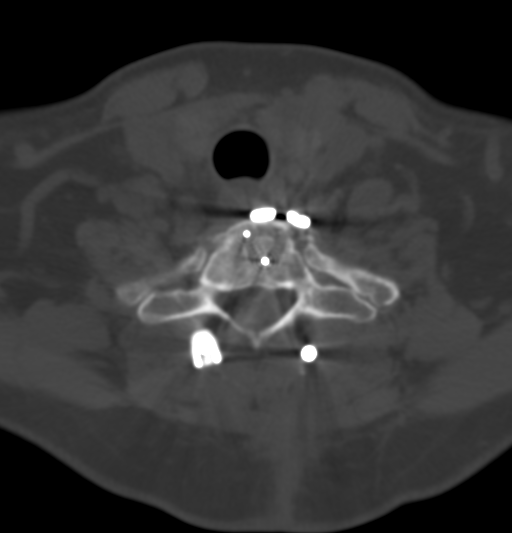
[im 40/79  bone]
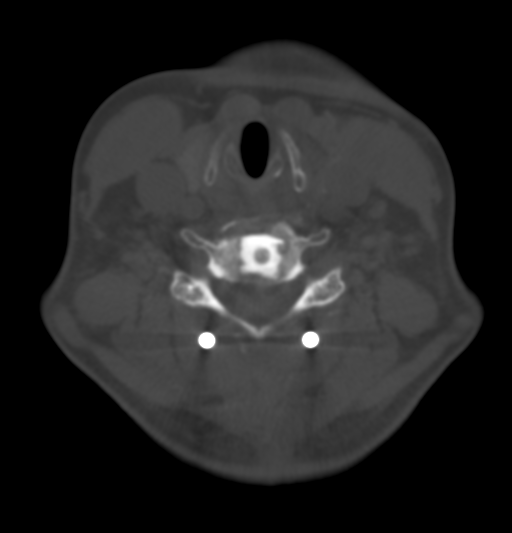
[im 53/79  bone]
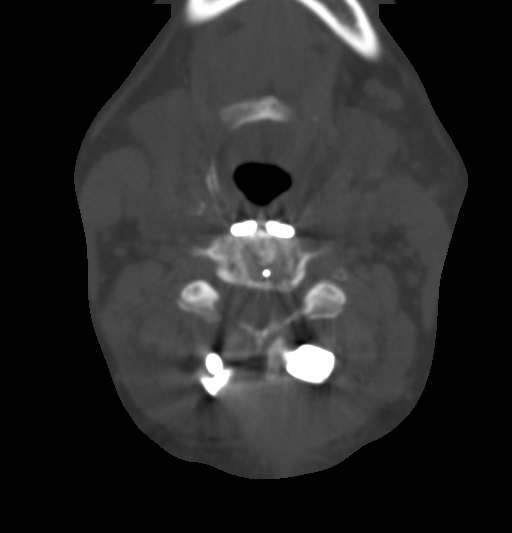
[im 66/79  soft-tissue]
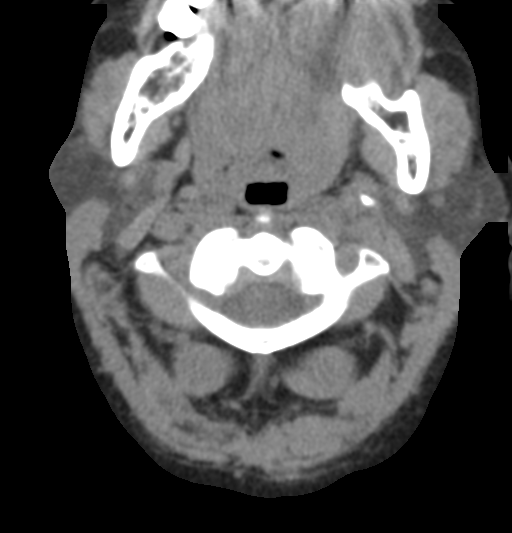
[im 66/79  bone]
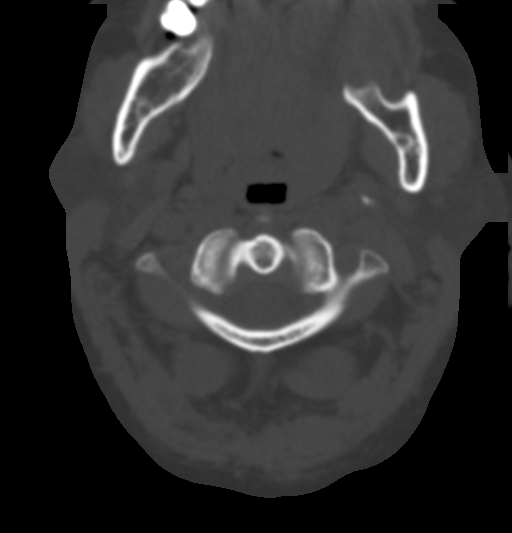

[15 of 33 positions shown; findings below may reference images not displayed]

FINDINGS: Alignment: Straightening of the normal cervical lordosis. Slight
kyphotic angulation centered at C5.

Skull base and vertebrae: Congenital fusion C2 and C3. Solid
arthrodesis status post ACDF C6-T1. C5 vertebral body dissolution,
due to subsidence of the interbody cages at C4-5 and C5-6. Trans
laminar and facet screws C2-3 T1, with slight loosening of the LEFT
T1 screw but no malposition of fracture.

Soft tissues and spinal canal: No prevertebral fluid or swelling. No
visible canal hematoma.

Disc levels:

C2-3:  Congenital fusion.

C3-4: Status post ACDF. Continued lucency through the interspace
without solid interbody or posterior arthrodesis. No definite
foraminal narrowing.

C4-5: Subsidence downward of the interbody cage. The anterior most
part of the cage extends 4 mm into the vertebral body. The C5
vertebral body is cleaved in two, with a smaller anterior and larger
posterior fragment. Slight retropulsion of the posterior fragment.
No solid interbody arthrodesis. LEFT C5 foraminal narrowing.

C5-6: Cage subsidence both superiorly and inferiorly. No solid
interbody arthrodesis. No impingement.

C6-7:  Solid arthrodesis.  No impingement.

C7-T1:  Solid arthrodesis.  No impingement.

Upper chest: Negative.

Other: None.
IMPRESSION: No evidence for interbody arthrodesis at C4-5 or C5-6 following
posterior instrumentation. See discussion above.

No solid posterior arthrodesis C2 through T1. Loosening of the LEFT
T1 screw.

## 2019-06-16 ENCOUNTER — Institutional Professional Consult (permissible substitution): Payer: Medicare Other | Admitting: Plastic Surgery

## 2019-06-18 ENCOUNTER — Encounter: Payer: Medicare Other | Admitting: Student in an Organized Health Care Education/Training Program

## 2019-06-18 ENCOUNTER — Encounter: Payer: Self-pay | Admitting: Skilled Nursing Facility1

## 2019-06-18 ENCOUNTER — Encounter: Payer: Medicare Other | Attending: Neurology | Admitting: Skilled Nursing Facility1

## 2019-06-18 ENCOUNTER — Other Ambulatory Visit: Payer: Self-pay

## 2019-06-18 DIAGNOSIS — E663 Overweight: Secondary | ICD-10-CM

## 2019-06-18 DIAGNOSIS — Z713 Dietary counseling and surveillance: Secondary | ICD-10-CM | POA: Insufficient documentation

## 2019-06-18 DIAGNOSIS — R635 Abnormal weight gain: Secondary | ICD-10-CM | POA: Diagnosis not present

## 2019-06-18 DIAGNOSIS — Z6829 Body mass index (BMI) 29.0-29.9, adult: Secondary | ICD-10-CM | POA: Insufficient documentation

## 2019-06-18 NOTE — Progress Notes (Addendum)
  Assessment:  Primary concerns today: weight gain.   Pt states she wants to be 135 pounds.  Pt states she has tried lipozene and keto to no avail.  Pt is allergic to peanuts and shellfish. Pt states she does have GERD daily.  Pt states her usual weight as an adult was 135 pounds. Pt states since she got a shot in he neck for pain in 2017 she has been gaining weight.  Pt states she lost 2 pounds in the last 2 weeks just from walking on the treadmill 2 days a week (getting sweaty and out of breath: HR 122-125). Pt states a lot of her medication make her feel very sleepy.  Pt states she does struggle with chronic pain.  Pt states when she is on the treadmill she feels pressure in her head.  Pt does have high energy. Pt states she starts her day with prayer.   Pt states she will call back to make a follow up appt.   Pt did struggle with comprehension.   MEDICATIONS: see list 156.1 Body Composition Scale 06/18/2019  Total Body Fat  46.3%  Visceral Fat   Fat-Free Mass %    Total Body Water  61.5   Muscle-Mass lbs 45.2     DIETARY INTAKE:  Usual eating pattern includes 2 meals and 3 snacks per day.  Everyday foods include none stated.  Avoided foods include none stated.    24-hr recall: eating out 1-2 times a week  B ( 10AM): cold cereal or fast food or bacon and eggs  Snk ( AM):  L ( PM):  Snk ( 2 PM) chicken or lasagna or spagetti  D ( 5 PM): honey bun or oatmeal cookie Snk ( 7 PM): ice cream and popcorn  Beverages: water, sweet tea, green tea   Usual physical activity: walking 3 miles 2-3 days a week at the gym treadmill    Intervention:  Nutrition cousneling. Dietitian educated pt on lack of physical activity and snacking at night being the most likely cause to weight gain.  Goals: -When your on the treadmill when you first start walking take your hear rate and check in with your pain level: of pain level still high then stop  -Perhaps limit yourself to 30 minutes and  do it 5 days a week -try doing 1 day out of the 5 of weights; max out at 5 pounds; if you experience pain stop -For breakfast: energy sources + protein  -Lunch: protein + energy sources + non-starchy vegetables  -Find the smaller bananas at the store; avoid the larger ones -Try sugar free jelly on your waffles  -Only eat something one time between 2pm and bedtime: a meal would be best, could just be leftovers from your 2pm meal -Switch to diet green tea -Aim for 5 bottles of water a day  -Look into a filter pitcher or one on the faucet to decrease the amount of money you are spending on bottled water  -Limit desserts to 2 times a week not every week in a month  Teaching Method Utilized:  Visual Auditory Hands on  Handouts given during visit include:  myplate  Barriers to learning/adherence to lifestyle change: none identified   Demonstrated degree of understanding via:  Teach Back   Monitoring/Evaluation:  Dietary intake, exercise,and body weight prn.

## 2019-06-18 NOTE — Patient Instructions (Addendum)
-  When your on the treadmill when you first start walking take your hear rate and check in with your pain level: of pain level still high then stop   -Perhaps limit yourself to 30 minutes and do it 5 days a week  -try doing 1 day out of the 5 of weights; max out at 5 pounds; if you experience pain stop  -For breakfast: energy sources + protein   -Lunch: protein + energy sources + non-starchy vegetables   -Find the smaller bananas at the store; avoid the larger ones  -Try sugar free jelly on your waffles   -Only eat something one time between 2pm and bedtime: a meal would be best, could just be leftovers from your 2pm meal  -Switch to diet green tea  -Aim for 5 bottles of water a day   -Look into a filter pitcher or one on the faucet to decrease the amount of money you are spending on bottled water

## 2019-06-22 ENCOUNTER — Encounter: Payer: Self-pay | Admitting: Student in an Organized Health Care Education/Training Program

## 2019-06-23 ENCOUNTER — Ambulatory Visit
Payer: Medicare Other | Attending: Student in an Organized Health Care Education/Training Program | Admitting: Student in an Organized Health Care Education/Training Program

## 2019-06-23 ENCOUNTER — Other Ambulatory Visit: Payer: Self-pay

## 2019-06-23 ENCOUNTER — Encounter: Payer: Self-pay | Admitting: Student in an Organized Health Care Education/Training Program

## 2019-06-23 DIAGNOSIS — Z9889 Other specified postprocedural states: Secondary | ICD-10-CM

## 2019-06-23 DIAGNOSIS — R519 Headache, unspecified: Secondary | ICD-10-CM

## 2019-06-23 DIAGNOSIS — M5481 Occipital neuralgia: Secondary | ICD-10-CM

## 2019-06-23 DIAGNOSIS — Q761 Klippel-Feil syndrome: Secondary | ICD-10-CM

## 2019-06-23 DIAGNOSIS — M47812 Spondylosis without myelopathy or radiculopathy, cervical region: Secondary | ICD-10-CM

## 2019-06-23 DIAGNOSIS — G894 Chronic pain syndrome: Secondary | ICD-10-CM | POA: Diagnosis not present

## 2019-06-23 DIAGNOSIS — M5030 Other cervical disc degeneration, unspecified cervical region: Secondary | ICD-10-CM | POA: Diagnosis not present
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
## 2019-06-23 MED ORDER — HYDROCODONE-ACETAMINOPHEN 10-325 MG PO TABS: 1.0000 | ORAL_TABLET | Freq: Two times a day (BID) | ORAL | 0 refills | Status: AC | PRN

## 2019-06-23 NOTE — Progress Notes (Signed)
Pain Management Virtual Encounter Note - Virtual Visit via [employer] (real-time audio visits between healthcare provider and patient).   Patient's Phone No. & Preferred Pharmacy:  [phone] (home); [phone] (mobile); (Preferred) [phone] [email or web address] .[facility]  [place], [place] - Kimberly [name] [place] [address] Phone: [phone] Fax: [phone]    Pre-screening note:  Our staff contacted Kimberly [name] and offered her an "in person", "face-to-face" appointment versus a telephone encounter. She indicated preferring the telephone encounter, at this time.   Reason for Virtual Visit: COVID-19*  Social distancing based on CDC and AMA recommendations.   I contacted Kimberly [name] on 06/23/2019 via video conference.      I clearly identified myself as Gillis Santa, MD. I verified that I was speaking with the correct person using two identifiers (Name: Kimberly [name], and date of birth: 1961-03-26).  Advanced Informed Consent I sought verbal advanced consent from Kimberly [name] for virtual visit interactions. I informed Kimberly [name] of possible security and privacy concerns, risks, and limitations associated with providing "not-in-person" medical evaluation and management services. I also informed Kimberly [name] of the availability of "in-person" appointments. Finally, I informed her that there would be a charge for the virtual visit and that she could be  personally, fully or partially, financially responsible for it. Kimberly [name] expressed understanding and agreed to proceed.   Historic Elements   Kimberly [name] is a 58 y.o. year old, female patient evaluated today after her last encounter by our practice on 04/07/2019. Kimberly [name]  has a past medical history of Anxiety, Arthritis, Asthma, Balance problem, Brain cyst, Depression, Difficult intubation, Dyspnea, Flu (08/10/2018), GERD (gastroesophageal reflux disease),  Headache, Herpes, History of kidney stones, History of pneumonia, Hypertension, Inflammation of shoulder joint, Memory difficulties, Neuromuscular disorder ([place]), Pneumothorax on right, PONV (postoperative nausea and vomiting), Recurrent falls, and Urinary frequency. She also  has a past surgical history that includes Abdominal hysterectomy; Rotator cuff repair (Left); Foreign Body Removal (Left); Carpal tunnel release (Left, 02/16/2013); Carpal tunnel release (Right, 03/27/2013); Cesarean section; Anterior cervical decomp/discectomy fusion (N/A, 08/06/2014); Colonoscopy; Nasal sinus surgery (N/A, 04/13/2015); Shoulder arthroscopy with distal clavicle resection (Left, 05/18/2015); Shoulder acromioplasty (Left, 05/18/2015); Cholecystectomy (N/A, 06/17/2015); Neck surgery (2016); Anterior cervical decomp/discectomy fusion (N/A, 07/04/2016); Posterior cervical fusion/foraminotomy (N/A, 11/13/2016); Knee arthroscopy; Extracorporeal shock wave lithotripsy (Left, 09/04/2018); and Colonoscopy with propofol (N/A, 05/06/2019). Kimberly [name] has a current medication list which includes the following prescription(s): acetaminophen, acyclovir, alprazolam, amlodipine, cholecalciferol, clotrimazole, cyclobenzaprine, diazepam, diclofenac sodium, escitalopram, estradiol, liothyronine, meclizine, meloxicam, nortriptyline, nystatin, oxybutynin, pantoprazole, prednisone, tetrahydrozoline hcl, valacyclovir, duloxetine, hydrocodone-acetaminophen, hydrocodone-acetaminophen, and hydrocodone-acetaminophen. She  reports that she has never smoked. She has never used smokeless tobacco. She reports that she does not drink alcohol or use drugs. Kimberly [name] is allergic to shellfish allergy; peanut-containing drug products; codeine; diphtheria toxoid; and tetanus toxoids.   HPI  Today, she is being contacted for medication management.  No significant changes in her medical history.  Is on Lyrica 50 mg 3 times daily now.  This is managed by neurology.   She states that her current Percocet dose makes her itch.  We discussed transitioning to hydrocodone and seeing if that has less side effects of pruritus.  Pharmacotherapy Assessment  Analgesic:  05/07/2019  2   04/07/2019  Oxycodone-Acetaminophen 10-325  45.00  30 Bi Lat   [id number]   Wal (1123)   0  22.50 MME  Medicare   [name]  Monitoring: Pharmacotherapy: + itching [place] PMP: PDMP reviewed during this encounter.       Compliance: No problems identified. Effectiveness: Clinically acceptable. Plan: Refer to "POC".  UDS:  Summary  Date Value Ref Range Status  02/03/2019 Note  Final    Comment:    ==================================================================== Tox[employer]SU[employer] Select 13 (MW) ==================================================================== Test                             Result       Flag       Units Drug Absent but Declared for Prescription Verification   Diazepam                       Not Detected UNEXPECTED ng/mg creat   Alprazolam                     Not Detected UNEXPECTED ng/mg creat   Oxycodone                      Not Detected UNEXPECTED ng/mg creat ==================================================================== Test                      Result    Flag   Units      Ref Range   Creatinine              234              mg/dL      >=20 ==================================================================== Declared Medications:  The flagging and interpretation on this report are based on the  following declared medications.  Unexpected results may arise from  inaccuracies in the declared medications.  **Note: The testing scope of this panel includes these medications:  Alprazolam  Diazepam  Oxycodone (Percocet)  **Note: The testing scope of this panel does not include the  following reported medications:  Acetaminophen  Acetaminophen (Percocet)  Acyclovir  Amlodipine  Cholecalciferol  Clotrimazole  Cyclobenzaprine  Diclofenac  Duloxetine   Escitalopram  Estradiol (Estrace)  Eye Drops  Gabapentin  Liothyronine  Meclizine  Meloxicam  Nortriptyline  Nystatin  Oxybutynin  Pantoprazole (Protonix)  Prednisone (Deltasone) ==================================================================== For clinical consultation, please call [phone]. ====================================================================    Laboratory Chemistry Profile (12 mo)  Renal: 08/04/2018: Creatinine, Ser 0.60  Lab Results  Component Value Date   GFRAA >60 07/15/2017   GFRNONAA >60 07/15/2017   Hepatic: No results found for requested labs within last 8760 hours. Lab Results  Component Value Date   AST 26 07/31/2015   ALT 17 07/31/2015   Other: No results found for requested labs within last 8760 hours. Note: Above Lab results reviewed.  Imaging  Last 90 days:  Dg Carlena Hurl Op Medicare Speech Path  Result Date: 05/05/2019 Please refer to "Notes" tab for Speech Pathology notes. CLINICAL DATA:  Dysphagia. EXAM: MODIFIED BARIUM SWALLOW TECHNIQUE: Different consistencies of barium were administered orally to the patient by the Speech Pathologist. Imaging of the pharynx was performed in the lateral projection. The radiologist was present in the fluoroscopy room for this study, providing personal supervision. FLUOROSCOPY TIME:  Fluoroscopy Time:  0.8 minutes Radiation Exposure Index (if provided by the fluoroscopic device): 1.2 mGy Number of Acquired Spot Images: 0 COMPARISON:  None. FINDINGS: Real-time fluoroscopy was utilized for evaluation of the swallow function with a speech pathologist present. Multiple consistencies of barium were administered to the patient. Consistencies included thin, nectar, applesauce,  cracker and barium tablet. Transient delay in passage of the barium tablet through the upper esophagus. After the transient delay, the barium tablet transit through the esophagus into the stomach without delay. Otherwise no focal  abnormality. No laryngeal penetration or tracheal aspiration. Incidental note made of anterior and posterior spinal fusion hardware. IMPRESSION: Modified barium swallow as described above. Please refer to the Speech Pathologists report for complete details and recommendations. Electronically Signed   By: Kathreen Devoid   On: 04/10/2019 13:12    Assessment  The primary encounter diagnosis was Chronic pain syndrome. Diagnoses of Cervical spondylosis without myelopathy, Cervical fusion syndrome (C2-T1), Degenerative disc disease, cervical, Hx of cervical spine surgery, Occipital headache, and Bilateral occipital neuralgia were also pertinent to this visit.  Plan of Care  I have discontinued Melissa T. Beeck's oxyCODONE-acetaminophen. I am also having her start on HYDROcodone-acetaminophen, HYDROcodone-acetaminophen, and HYDROcodone-acetaminophen. Additionally, I am having her maintain her amLODipine, acyclovir, escitalopram, oxybutynin, clotrimazole, cyclobenzaprine, nortriptyline, pantoprazole, diazepam, acetaminophen, Tetrahydrozoline HCl (VISINE OP), ALPRAZolam, nystatin, Cholecalciferol, liothyronine, predniSONE, DULoxetine, diclofenac sodium, estradiol, meclizine, meloxicam, and valACYclovir.  Stop oxycodone given side effects of pruritus.  Will have staff member call pharmacy to cancel oxycodone prescription that is there.  We will send a new prescription for hydrocodone as below.  Continue Tylenol 500 to 1000 mg twice daily as needed, Flexeril 10 mg nightly as needed, and Lyrica 50 mg 3 times daily as prescribed by neurology.  Pharmacotherapy (Medications Ordered): Meds ordered this encounter  Medications  . HYDROcodone-acetaminophen (NORCO) 10-325 MG tablet    Sig: Take 1 tablet by mouth 2 (two) times daily as needed for severe pain. Must last 30 days.    Dispense:  45 tablet    Refill:  0    Chronic Pain. (STOP Act - Not applicable). Fill one day early if closed on scheduled refill date.  Marland Kitchen  HYDROcodone-acetaminophen (NORCO) 10-325 MG tablet    Sig: Take 1 tablet by mouth 2 (two) times daily as needed for severe pain. Must last 30 days.    Dispense:  45 tablet    Refill:  0    Chronic Pain. (STOP Act - Not applicable). Fill one day early if closed on scheduled refill date.  Marland Kitchen HYDROcodone-acetaminophen (NORCO) 10-325 MG tablet    Sig: Take 1 tablet by mouth 2 (two) times daily as needed for severe pain. Must last 30 days.    Dispense:  45 tablet    Refill:  0    Chronic Pain. (STOP Act - Not applicable). Fill one day early if closed on scheduled refill date.   Follow-up plan:   Return in about 3 months (around 09/23/2019).     Consider TPI, cervical facet medial branch nerve block, suprascapular nerve block     Recent Visits Date Type Provider Dept  04/07/19 Office Visit Gillis Santa, MD [facility]  Showing recent visits within past 90 days and meeting all other requirements   Today's Visits Date Type Provider Dept  06/23/19 Office Visit Gillis Santa, MD [facility]  Showing today's visits and meeting all other requirements   Future Appointments No visits were found meeting these conditions.  Showing future appointments within next 90 days and meeting all other requirements   I discussed the assessment and treatment plan with the patient. The patient was provided an opportunity to ask questions and all were answered. The patient agreed with the plan and demonstrated an understanding of the instructions.  Patient advised to call back  or seek an in-person evaluation if the symptoms or condition worsens.  Total duration of non-face-to-face encounter: 36minutes.  Note by: Gillis Santa, MD Date: 06/23/2019; Time: 1:41 PM  Note: This dictation was prepared with Dragon dictation. Any transcriptional errors that may result from this process are unintentional.  Disclaimer:  * Given the special circumstances of the COVID-19 pandemic, the federal  government has announced that the Office for Civil Rights (OCR) will exercise its enforcement discretion and will not impose penalties on physicians using telehealth in the event of noncompliance with regulatory requirements under the [place] (HIPAA) in connection with the good faith provision of telehealth during the XX123456 national public health emergency. ([place])

## 2019-06-24 IMAGING — DX DG CHEST 2V
2 series · 2 of 2 positions shown · non-contrast
Comparison: Chest CT yesterday at 8433 hour

CLINICAL DATA: Rib pain. Left-sided chest pain. Symptoms for 1 day.

EXAM:
CHEST  2 VIEW

[chest pa]
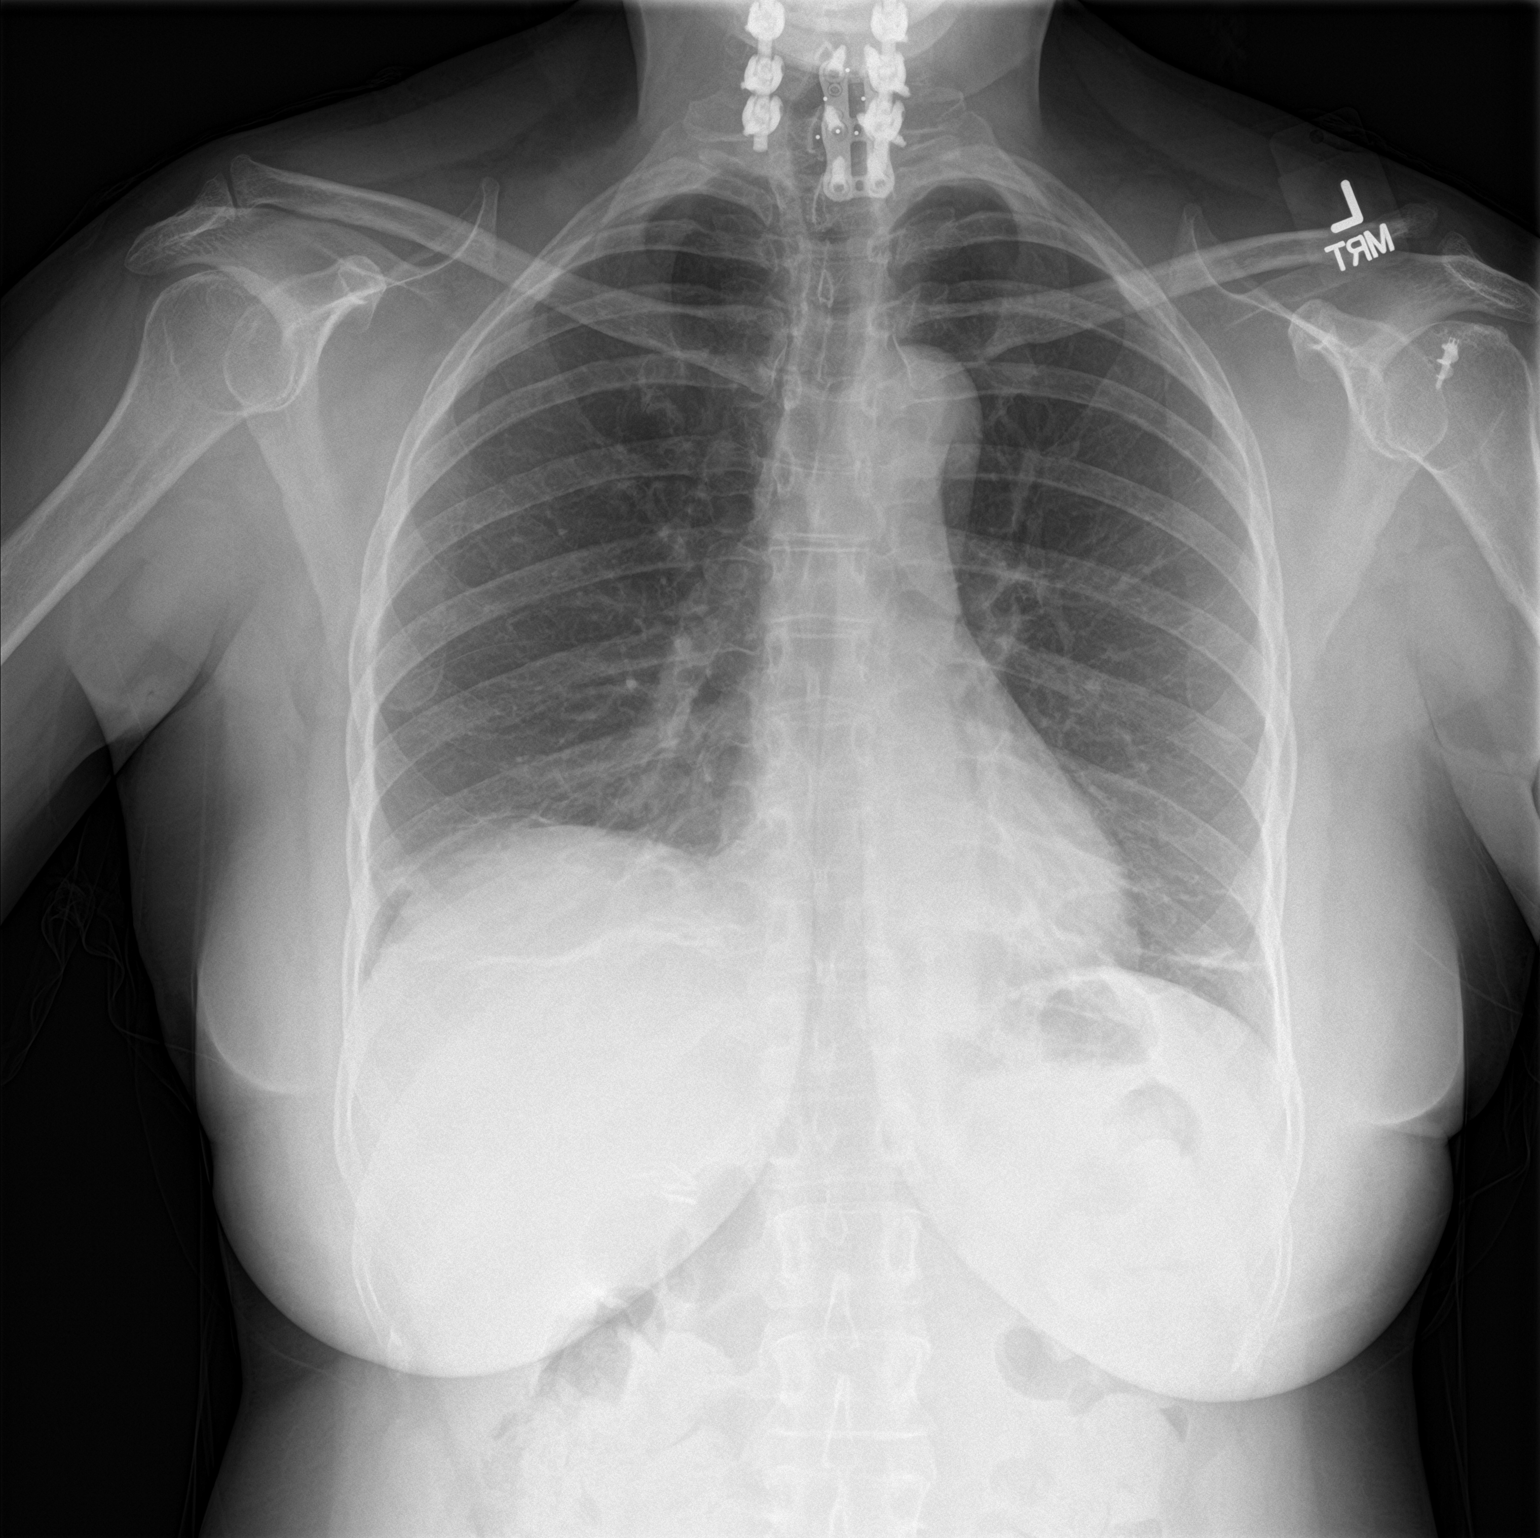

[chest lat]
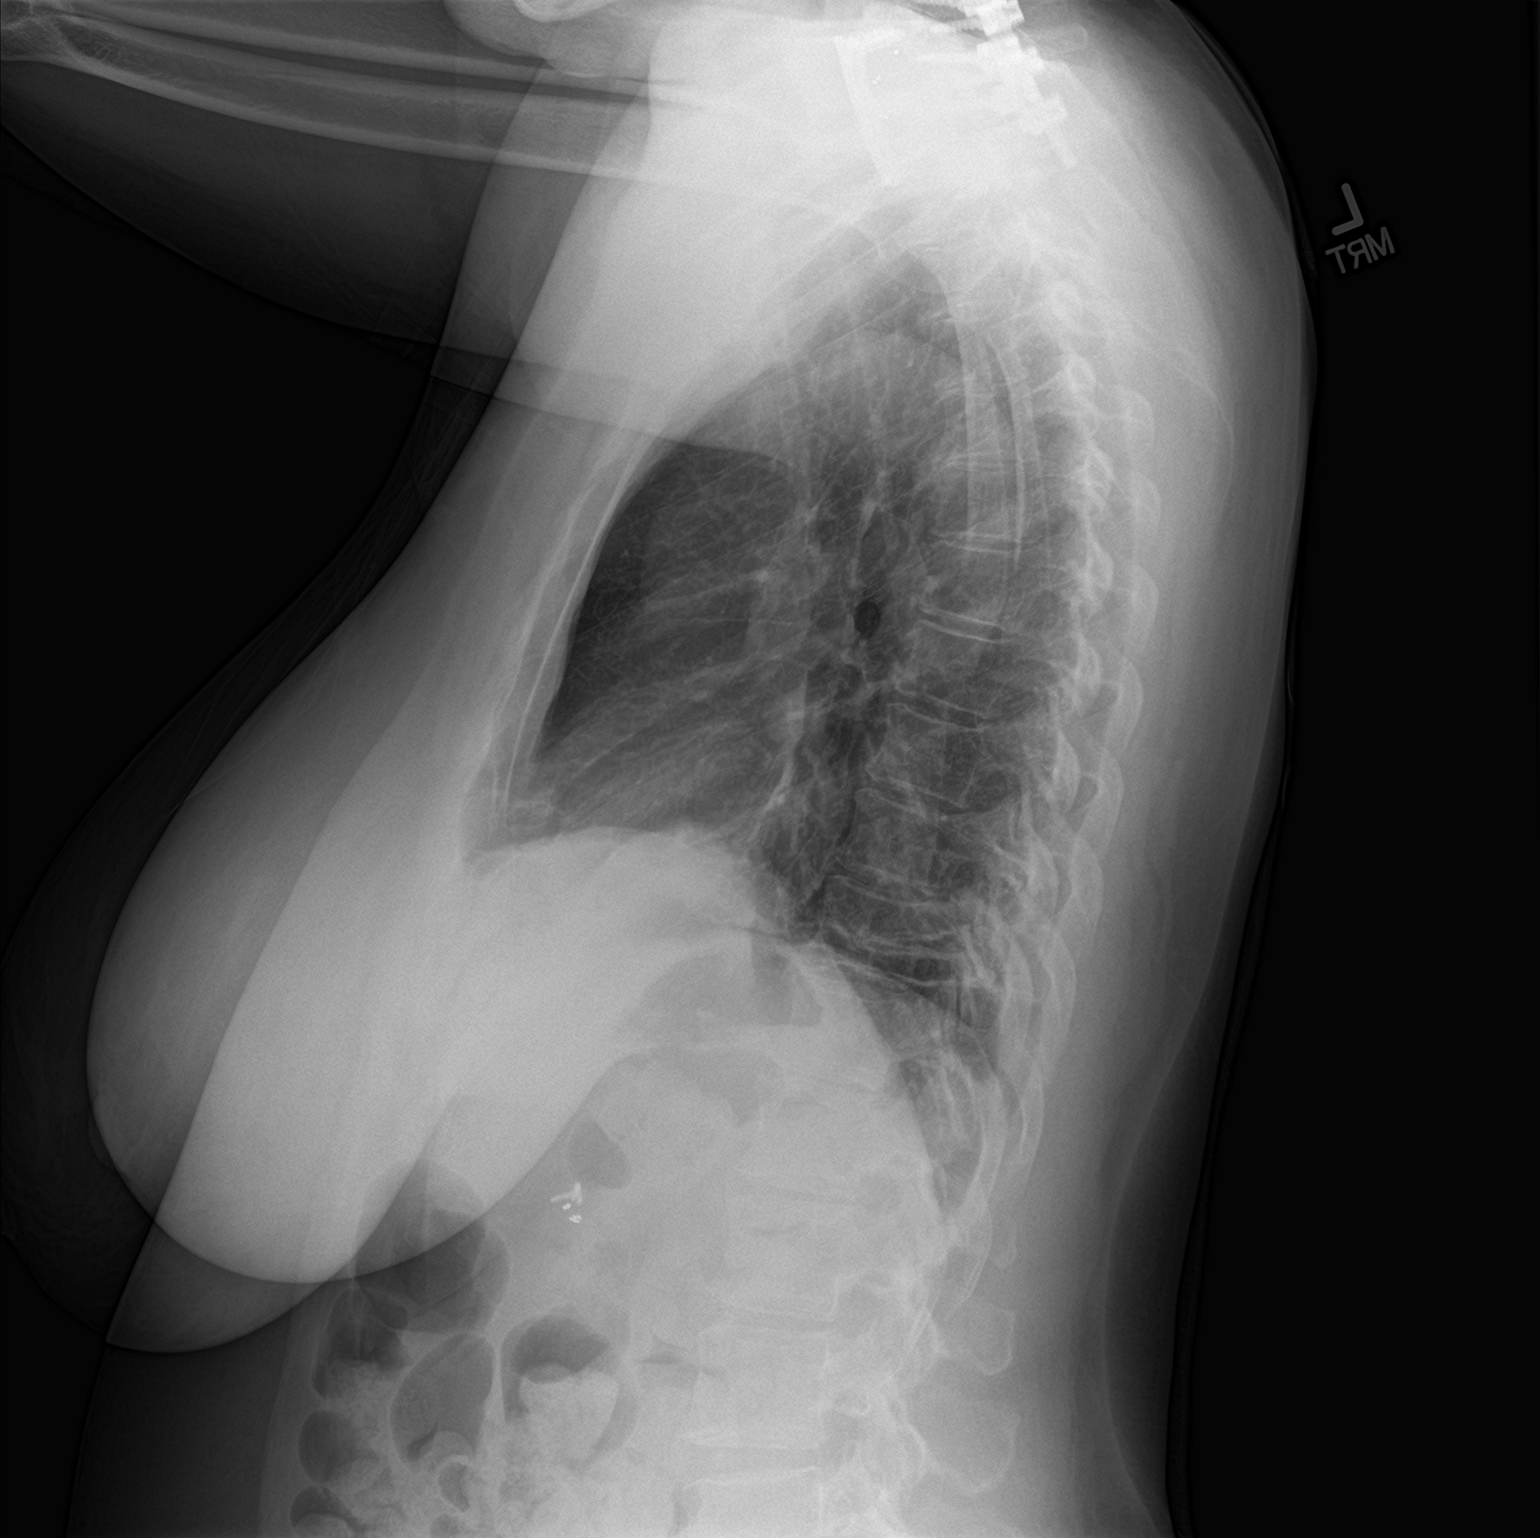

[2 of 2 positions shown; findings below may reference images not displayed]

FINDINGS: The cardiomediastinal contours are normal. Subsegmental linear
atelectasis or scarring at both lung bases is unchanged. Pulmonary
vasculature is normal. No consolidation, pleural effusion, or
pneumothorax.

Review of prior CT demonstrates a nondisplaced fracture of the left
anterior fourth rib. This is not seen radiographically.
IMPRESSION: 1. Subsegmental atelectasis or scarring at both lung bases.
2. Review of CT yesterday demonstrates nondisplaced left anterior
fourth rib fracture, this is not seen radiographically. No
pneumothorax.

## 2019-06-29 DIAGNOSIS — G479 Sleep disorder, unspecified: Secondary | ICD-10-CM | POA: Diagnosis not present

## 2019-06-29 DIAGNOSIS — R413 Other amnesia: Secondary | ICD-10-CM | POA: Diagnosis not present

## 2019-06-29 DIAGNOSIS — R519 Headache, unspecified: Secondary | ICD-10-CM | POA: Diagnosis not present

## 2019-06-29 DIAGNOSIS — M542 Cervicalgia: Secondary | ICD-10-CM | POA: Diagnosis not present

## 2019-06-29 DIAGNOSIS — Z87898 Personal history of other specified conditions: Secondary | ICD-10-CM | POA: Diagnosis not present

## 2019-07-01 ENCOUNTER — Telehealth: Payer: Self-pay

## 2019-07-01 DIAGNOSIS — G894 Chronic pain syndrome: Secondary | ICD-10-CM | POA: Diagnosis not present

## 2019-07-01 NOTE — Telephone Encounter (Signed)
I spoke with patient and she wishes to discontinue the hydrocodone and she states she will come today for the lab to be drawn. I will call the pharmacy and cancel the remaining hydrocodone scripts. Thank you- J

## 2019-07-01 NOTE — Telephone Encounter (Signed)
The hydrocodone is making her itch horribly. This is not going to work for her. Please call.

## 2019-07-01 NOTE — Telephone Encounter (Signed)
She can try to see if PO Benadryl 25-50 mg helps with itching. I'm in favor of her discontinuing her opioid therapy since it's not helping and causing SE.  If patient does not want to continue with Hydrocodone please call her pharmacy and cancel previous Rx of Hydrocodone.  I would like to get a BMP to evaluate her Cr and electrolytes before considering 800 mg IBUPROFEN.  BMP order placed.

## 2019-07-01 NOTE — Telephone Encounter (Signed)
States she is having itching and nausea with the hydrocodone you prescribed. You changed her to hydrocodone because she was itching with oxycodone. She is asking if you  can prescribe  the 800 mg Ibuprofen instead of the opioids. Please advise. Thanks- Kimberly [name]

## 2019-07-02 LAB — BASIC METABOLIC PANEL
BUN/Creatinine Ratio: 10 (ref 9–23)
BUN: 9 mg/dL (ref 6–24)
CO2: 18 mmol/L — ABNORMAL LOW (ref 20–29)
Calcium: 10.3 mg/dL — ABNORMAL HIGH (ref 8.7–10.2)
Chloride: 103 mmol/L (ref 96–106)
Creatinine, Ser: 0.86 mg/dL (ref 0.57–1.00)
GFR calc Af Amer: 86 mL/min/{1.73_m2} (ref >59)
GFR calc non Af Amer: 75 mL/min/{1.73_m2} (ref >59)
Glucose: 94 mg/dL (ref 65–99)
Potassium: 4.8 mmol/L (ref 3.5–5.2)
Sodium: 144 mmol/L (ref 134–144)

## 2019-07-02 MED ORDER — IBUPROFEN 800 MG PO TABS: 800.0000 mg | ORAL_TABLET | Freq: Every day | ORAL | 2 refills | Status: AC | PRN

## 2019-07-02 NOTE — Telephone Encounter (Signed)
Called and left patient VM of instructions.

## 2019-07-02 NOTE — Addendum Note (Signed)
Addended by: Gillis Santa on: 07/02/2019 08:22 AM   Modules accepted: Orders

## 2019-07-02 NOTE — Telephone Encounter (Signed)
Cr looks fine. Kidney fnc within normal limits. Have pt dc Hydrodocone and can take Ibuprofen 400-800 mg daily prn as needed. Would not recommend taking daily.

## 2019-07-03 DIAGNOSIS — L720 Epidermal cyst: Secondary | ICD-10-CM | POA: Diagnosis not present

## 2019-07-06 IMAGING — DX DG CHEST 2V
2 series · 2 of 2 positions shown · non-contrast
Comparison: 02/05/2017

CLINICAL DATA: Patient states structure of through her living room
and put her out of her chair.

EXAM:
CHEST  2 VIEW

[chest lat]
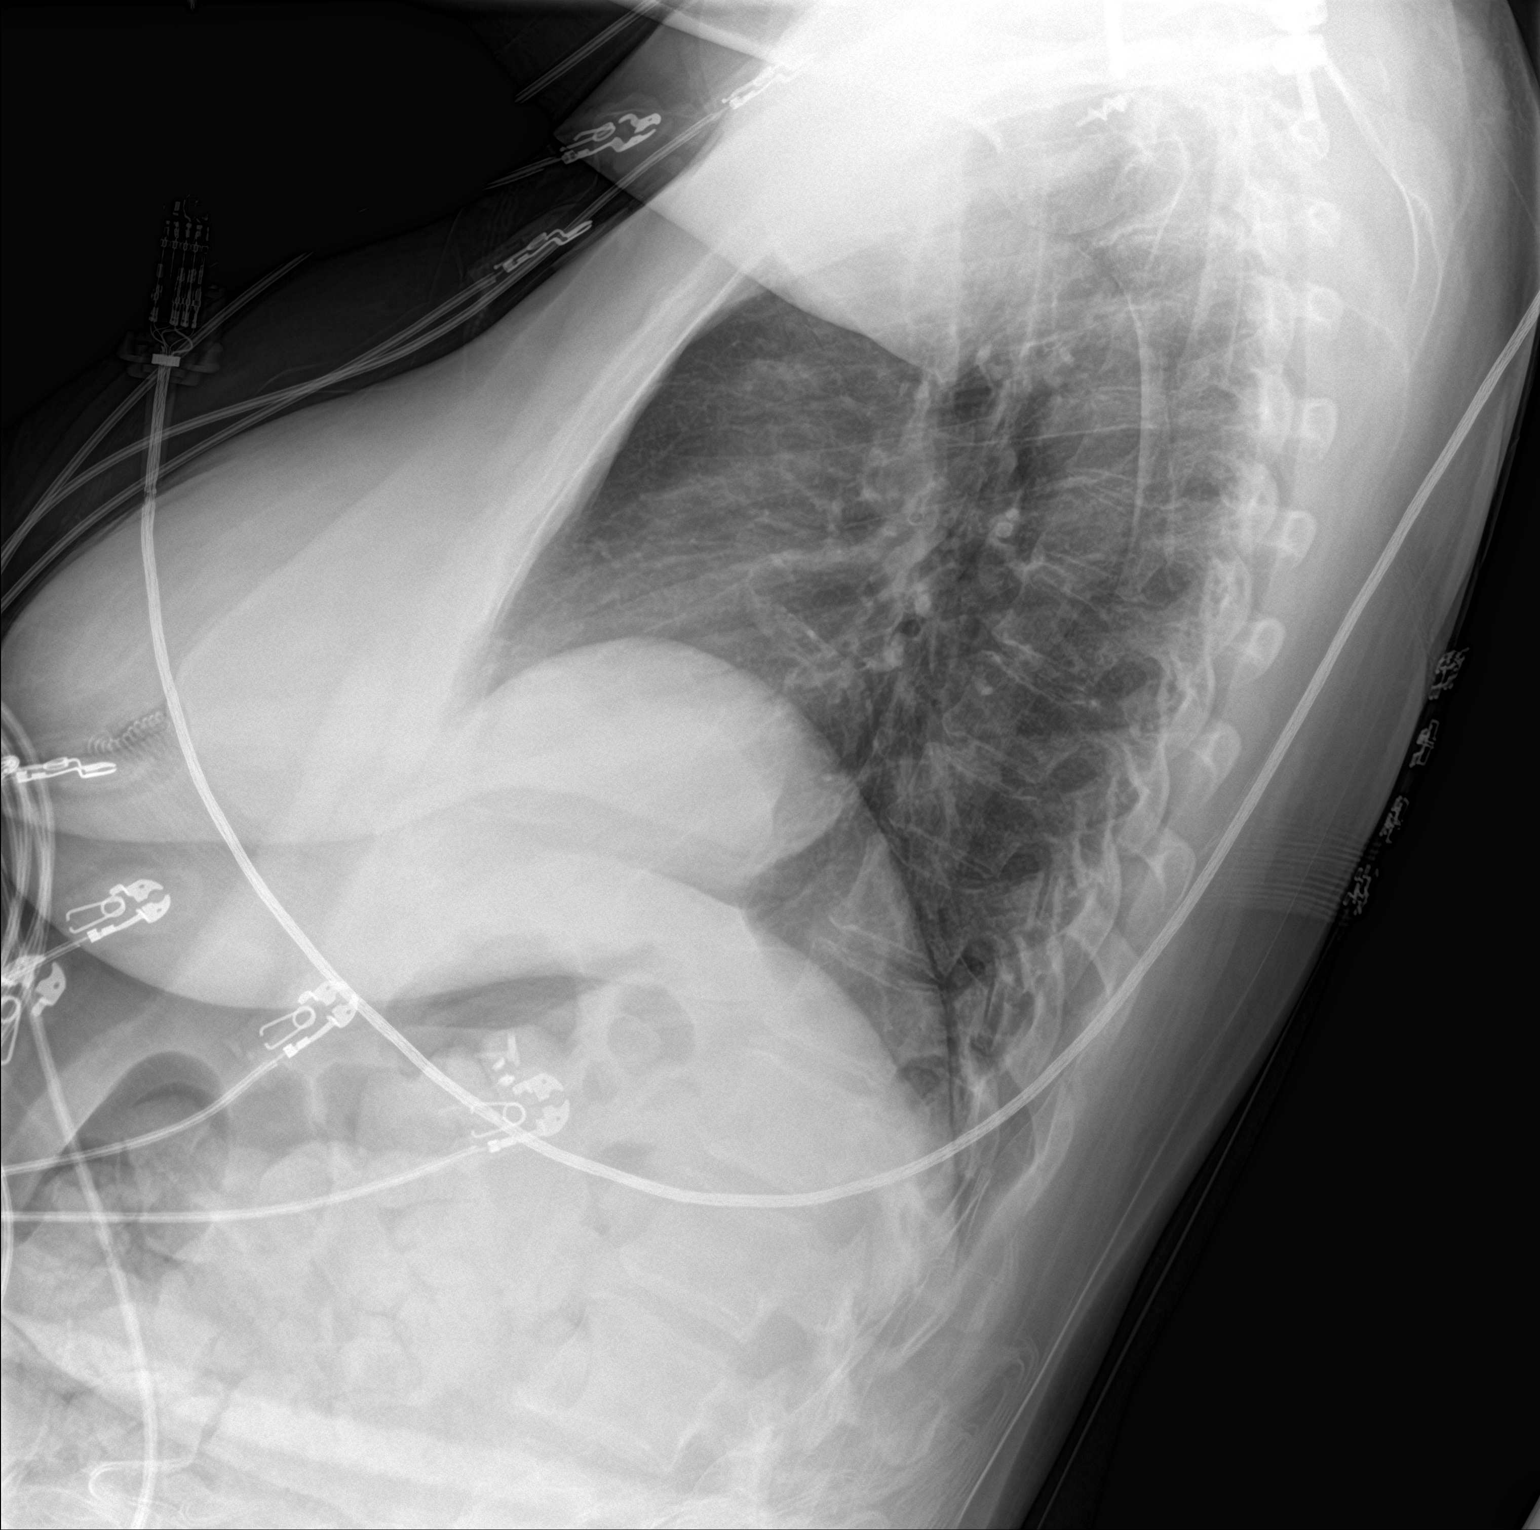

[chest ap]
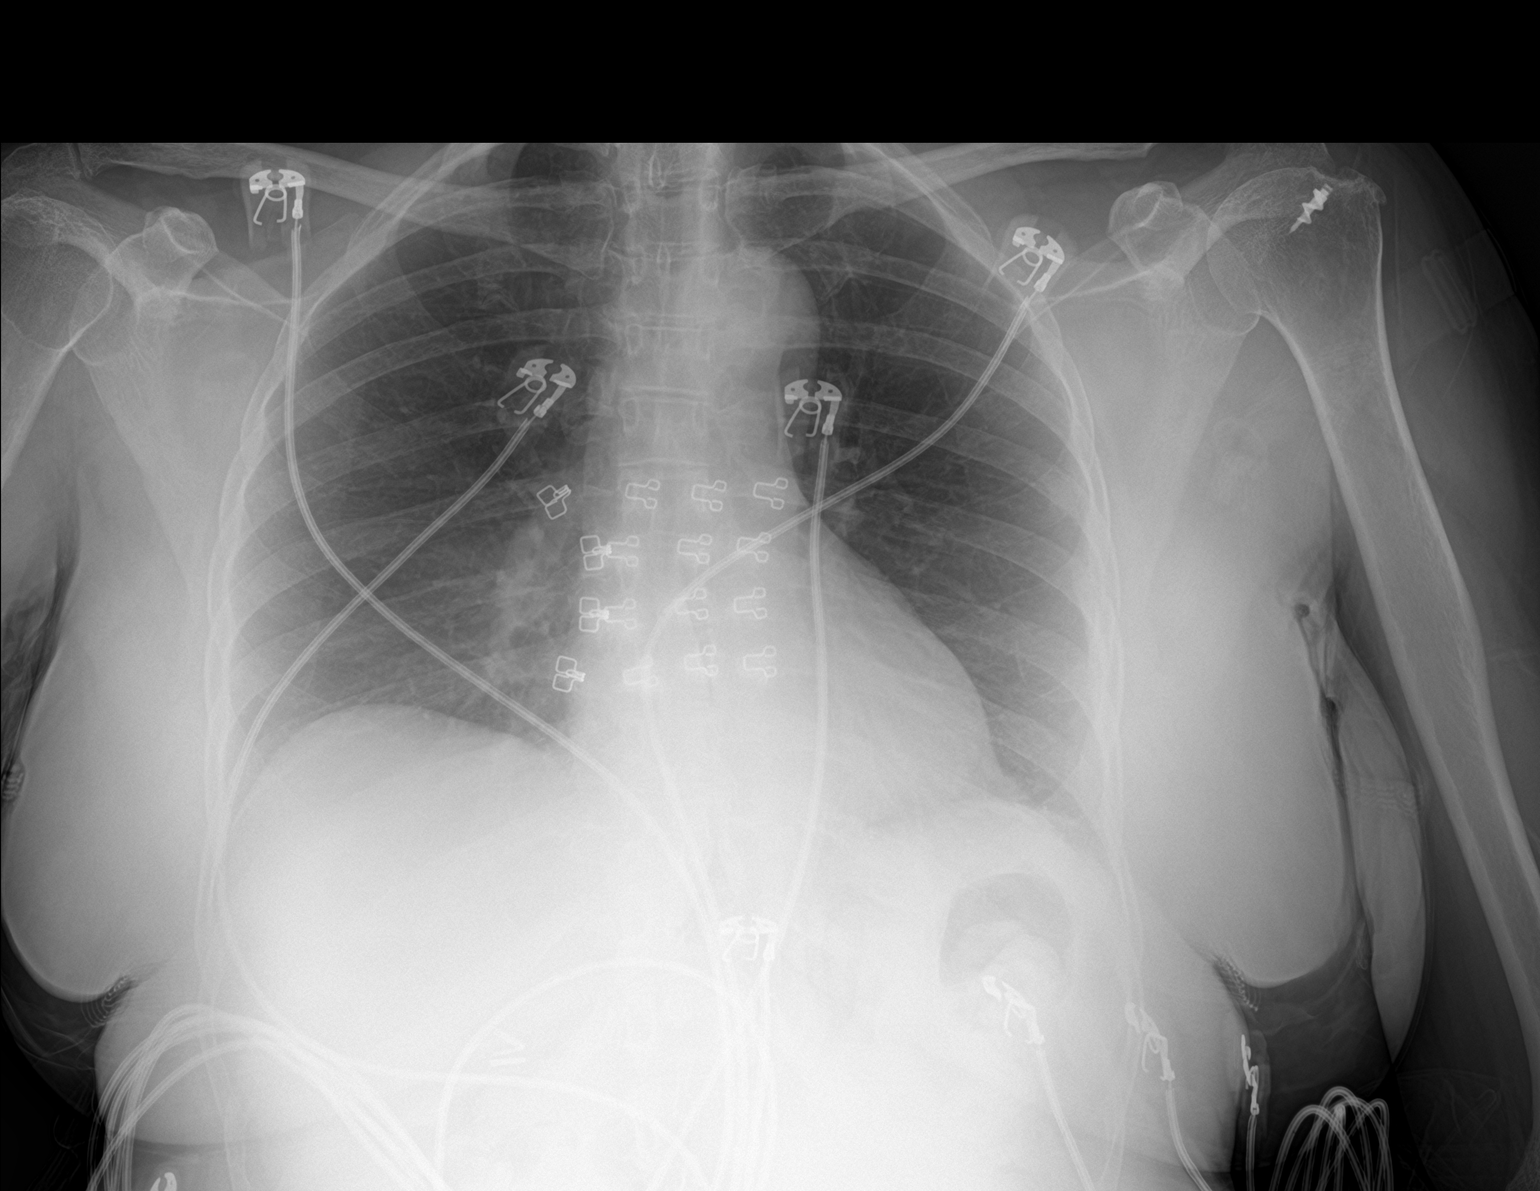

[2 of 2 positions shown; findings below may reference images not displayed]

FINDINGS: Lungs are adequately inflated without focal consolidation or
effusion. Cardiomediastinal silhouette is within normal. Partially
visualized fusion hardware over the cervicothoracic junction
unchanged. Minimal degenerative change of the spine.
IMPRESSION: No active cardiopulmonary disease.

## 2019-07-07 ENCOUNTER — Other Ambulatory Visit: Payer: Self-pay | Admitting: Family Medicine

## 2019-07-07 DIAGNOSIS — Z1231 Encounter for screening mammogram for malignant neoplasm of breast: Secondary | ICD-10-CM

## 2019-07-08 ENCOUNTER — Encounter: Payer: Medicare Other | Attending: Neurology | Admitting: Skilled Nursing Facility1

## 2019-07-08 ENCOUNTER — Other Ambulatory Visit: Payer: Self-pay

## 2019-07-08 DIAGNOSIS — R635 Abnormal weight gain: Secondary | ICD-10-CM | POA: Insufficient documentation

## 2019-07-08 DIAGNOSIS — E663 Overweight: Secondary | ICD-10-CM

## 2019-07-08 DIAGNOSIS — Z713 Dietary counseling and surveillance: Secondary | ICD-10-CM | POA: Insufficient documentation

## 2019-07-08 DIAGNOSIS — Z6829 Body mass index (BMI) 29.0-29.9, adult: Secondary | ICD-10-CM | POA: Diagnosis not present

## 2019-07-08 NOTE — Progress Notes (Signed)
  Assessment:  Primary concerns today: weight gain.   Pt states she wants to be 135 pounds.  Pt is allergic to peanuts and shellfish. Pt does have high energy. Pt states she starts her day with prayer.  Pt states she will call back to make a follow up appt.  Pt does struggle with comprehension.  Pt is very talkative. Pt states she has been in so much pain she has been laying around not being active resulting in weight gain.  Pt states she will be getting a breast reduction which she hopes will result in less back pain.  MEDICATIONS: see list 157.8 Body Composition Scale 06/18/2019 07/08/2019  Total Body Fat  46.3% 45.8           Total Body Water  61.5 62.8   Muscle-Mass lbs 45.2 46.3     DIETARY INTAKE:  Usual eating pattern includes 2 meals and 3 snacks per day.  Everyday foods include none stated.  Avoided foods include none stated.    24-hr recall: eating out 1-2 times a week  B ( 10AM): cold cereal or fast food or 1 strip [place] bacon and eggs or naked juice Snk ( AM):  L ( PM): lasagna  Snk ( 2 PM) salad: iceburg lettuce cucumbers and croutons and ranch  D ( 5 PM): 2 tacos from [employer] Snk ( 7 PM): 1 apple   Beverages: water, sweet green tea   Usual physical activity: ADL's due to pain    Intervention:  Nutrition cousneling. Dietitian educated pt on lack of physical activity and snacking at night being the most likely cause to weight gain.  Goals: -Measure out your LIGHT ranch to 2 tablespoons with an actual measuring cup -Do not put ANYTHING in your mouth more than 1 time after dinner and before bed  -Eat until satisfied NOT full; do not eat until you physically feel it in your stomach  -Do not drink the sugary green tea buy the diet green tea  Teaching Method Utilized:  Visual Auditory Hands on   Barriers to learning/adherence to lifestyle change: none identified   Demonstrated degree of understanding via:  Teach Back   Monitoring/Evaluation:  Dietary  intake, exercise,and body weight prn.

## 2019-07-28 DIAGNOSIS — R42 Dizziness and giddiness: Secondary | ICD-10-CM | POA: Diagnosis not present

## 2019-07-28 DIAGNOSIS — R519 Headache, unspecified: Secondary | ICD-10-CM | POA: Diagnosis not present

## 2019-07-28 DIAGNOSIS — Z87898 Personal history of other specified conditions: Secondary | ICD-10-CM | POA: Diagnosis not present

## 2019-07-28 DIAGNOSIS — R413 Other amnesia: Secondary | ICD-10-CM | POA: Diagnosis not present

## 2019-07-28 DIAGNOSIS — G479 Sleep disorder, unspecified: Secondary | ICD-10-CM | POA: Diagnosis not present

## 2019-09-17 ENCOUNTER — Ambulatory Visit: Payer: Medicare Other | Admitting: Urology

## 2019-09-21 ENCOUNTER — Encounter: Payer: Self-pay | Admitting: Student in an Organized Health Care Education/Training Program

## 2019-09-22 ENCOUNTER — Ambulatory Visit
Payer: Medicare Other | Attending: Student in an Organized Health Care Education/Training Program | Admitting: Student in an Organized Health Care Education/Training Program

## 2019-09-22 ENCOUNTER — Encounter: Payer: Self-pay | Admitting: Student in an Organized Health Care Education/Training Program

## 2019-09-22 ENCOUNTER — Other Ambulatory Visit: Payer: Self-pay

## 2019-09-22 DIAGNOSIS — G894 Chronic pain syndrome: Secondary | ICD-10-CM | POA: Diagnosis not present

## 2019-09-22 DIAGNOSIS — Q761 Klippel-Feil syndrome: Secondary | ICD-10-CM

## 2019-09-22 DIAGNOSIS — R519 Headache, unspecified: Secondary | ICD-10-CM

## 2019-09-22 DIAGNOSIS — M7918 Myalgia, other site: Secondary | ICD-10-CM

## 2019-09-22 DIAGNOSIS — J358 Other chronic diseases of tonsils and adenoids: Secondary | ICD-10-CM | POA: Diagnosis not present

## 2019-09-22 DIAGNOSIS — Z9889 Other specified postprocedural states: Secondary | ICD-10-CM | POA: Diagnosis not present

## 2019-09-22 DIAGNOSIS — M47812 Spondylosis without myelopathy or radiculopathy, cervical region: Secondary | ICD-10-CM

## 2019-09-22 DIAGNOSIS — J029 Acute pharyngitis, unspecified: Secondary | ICD-10-CM | POA: Diagnosis not present

## 2019-09-22 NOTE — Progress Notes (Signed)
Patient: Kimberly [name]  Service Category: E/M  Provider: Gillis Santa, MD  DOB: 17-Sep-1960  DOS: 09/22/2019  Location: Office  MRN: [id number]  Setting: Ambulatory outpatient  Referring Provider: Dion Body, MD  Type: Established Patient  Specialty: Interventional Pain Management  PCP: Dion Body, MD  Location: Home  Delivery: TeleHealth     Virtual Encounter - Pain Management PROVIDER NOTE: Information contained herein reflects review and annotations entered in association with encounter. Interpretation of such information and data should be left to medically-trained personnel. Information provided to patient can be located elsewhere in the medical record under "Patient Instructions". Document created using STT-dictation technology, any transcriptional errors that may result from process are unintentional.    Contact & Pharmacy Preferred: [phone] Home: [phone] (home) Mobile: [phone] (mobile) E-mail: [email or web address] .[place], [place] - [place] [place] [address] Phone: [phone] Fax: [phone]  [facility] ([place], [address] [place] [address] Phone: [phone] Fax: [phone]   Pre-screening  Ms. Simone offered "in-person" vs "virtual" encounter. She indicated preferring virtual for this encounter.   Reason COVID-19*  Social distancing based on CDC and AMA recommendations.   I contacted Kimberly [name] on 09/22/2019 via telephone.      I clearly identified myself as Gillis Santa, MD. I verified that I was speaking with the correct person using two identifiers (Name: Kimberly [name], and date of birth: 03/08/1961).  This visit was completed via telephone due to the restrictions of the COVID-19 pandemic. All issues as above were discussed and addressed but no physical exam was performed. If it was felt that the  patient should be evaluated in the office, they were directed there. The patient verbally consented to this visit. Patient was unable to complete an audio/visual visit due to Technical difficulties and/or Lack of internet. Due to the catastrophic nature of the COVID-19 pandemic, this visit was done through audio contact only.  Location of the patient: home address (see Epic for details)  Location of the provider: office  Consent I sought verbal advanced consent from Kimberly [name] for virtual visit interactions. I informed Kimberly [name] of possible security and privacy concerns, risks, and limitations associated with providing "not-in-person" medical evaluation and management services. I also informed Kimberly [name] of the availability of "in-person" appointments. Finally, I informed her that there would be a charge for the virtual visit and that she could be  personally, fully or partially, financially responsible for it. Kimberly [name] expressed understanding and agreed to proceed.   Historic Elements   Kimberly [name] is a 59 y.o. year old, female patient evaluated today after her last contact with our practice on 07/01/2019. Kimberly [name]  has a past medical history of Anxiety, Arthritis, Asthma, Balance problem, Brain cyst, Depression, Difficult intubation, Dyspnea, Flu (08/10/2018), GERD (gastroesophageal reflux disease), Headache, Herpes, History of kidney stones, History of pneumonia, Hypertension, Inflammation of shoulder joint, Memory difficulties, Neuromuscular disorder ([place]), Pneumothorax on right, PONV (postoperative nausea and vomiting), Recurrent falls, and Urinary frequency. She also  has a past surgical history that includes Abdominal hysterectomy; Rotator cuff repair (Left); Foreign Body Removal (Left); Carpal tunnel release (Left, 02/16/2013); Carpal tunnel release (Right, 03/27/2013); Cesarean section; Anterior cervical decomp/discectomy fusion (N/A, 08/06/2014); Colonoscopy; Nasal sinus  surgery (N/A, 04/13/2015); Shoulder arthroscopy with distal clavicle resection (Left, 05/18/2015); Shoulder acromioplasty (Left, 05/18/2015); Cholecystectomy (N/A, 06/17/2015); Neck surgery (2016);  Anterior cervical decomp/discectomy fusion (N/A, 07/04/2016); Posterior cervical fusion/foraminotomy (N/A, 11/13/2016); Knee arthroscopy; Extracorporeal shock wave lithotripsy (Left, 09/04/2018); and Colonoscopy with propofol (N/A, 05/06/2019). Kimberly [name] has a current medication list which includes the following prescription(s): acetaminophen, acyclovir, alprazolam, amlodipine, cholecalciferol, clotrimazole, cyclobenzaprine, diazepam, diclofenac sodium, escitalopram, estradiol, hydrocodone-acetaminophen, ibuprofen, liothyronine, meclizine, meloxicam, nortriptyline, nystatin, oxybutynin, pantoprazole, tetrahydrozoline hcl, valacyclovir, duloxetine, and prednisone. She  reports that she has never smoked. She has never used smokeless tobacco. She reports that she does not drink alcohol or use drugs. Kimberly [name] is allergic to shellfish allergy; peanut-containing drug products; codeine; diphtheria toxoid; and tetanus toxoids.   HPI  Today, she is being contacted for medication management.   Having increased sore throat, PCP has referred to ENT. Difficult to swallow. States that she has to wash down with water. Also having increased headaches and cervical spine pain.  Endorsing tenderness along her trapezius region and in her cervical spinal muscles.  No inciting or traumatic event.  Believes it is related to the cold weather and rain.  She has been having posterior dominant headaches.  Discussed trigger point injections.  Risks and benefits reviewed patient would like to proceed.  In regards to medication management, patient still has 2 prescriptions of hydrocodone that she can fill from her pharmacy.  She was not aware of this.  Her last refill was on 06/30/2019.  Patient states that she will go to the pharmacy today to  pick up another prescription.  She continues Lyrica 50 mg 3 times daily.  She finds moderate benefit with this medication.   Pharmacotherapy Assessment  Analgesic:  Hydrocodone 10 mg twice daily as needed, quantity 45/month  Monitoring: [place] PMP: PDMP reviewed during this encounter.       Pharmacotherapy: No side-effects or adverse reactions reported. Compliance: No problems identified. Effectiveness: Clinically acceptable. Plan: Refer to "POC".  UDS:  Summary  Date Value Ref Range Status  02/03/2019 Note  Final    Comment:    ==================================================================== [employer] Select 13 (MW) ==================================================================== Test                             Result       Flag       Units Drug Absent but Declared for Prescription Verification   Diazepam                       Not Detected UNEXPECTED ng/mg creat   Alprazolam                     Not Detected UNEXPECTED ng/mg creat   Oxycodone                      Not Detected UNEXPECTED ng/mg creat ==================================================================== Test                      Result    Flag   Units      Ref Range   Creatinine              234              mg/dL      >=20 ==================================================================== Declared Medications:  The flagging and interpretation on this report are based on the  following declared medications.  Unexpected results may arise from  inaccuracies in the declared medications.  **Note: The testing scope of this panel includes these  medications:  Alprazolam  Diazepam  Oxycodone (Percocet)  **Note: The testing scope of this panel does not include the  following reported medications:  Acetaminophen  Acetaminophen (Percocet)  Acyclovir  Amlodipine  Cholecalciferol  Clotrimazole  Cyclobenzaprine  Diclofenac  Duloxetine  Escitalopram  Estradiol (Estrace)  Eye Drops  Gabapentin  Liothyronine   Meclizine  Meloxicam  Nortriptyline  Nystatin  Oxybutynin  Pantoprazole (Protonix)  Prednisone (Deltasone) ==================================================================== For clinical consultation, please call [phone]. ====================================================================    Laboratory Chemistry Profile   Renal Lab Results  Component Value Date   BUN 9 07/01/2019   CREATININE 0.86 07/01/2019   BCR 10 07/01/2019   GFRAA 86 07/01/2019   GFRNONAA 75 07/01/2019    Hepatic Lab Results  Component Value Date   AST 26 07/31/2015   ALT 17 07/31/2015   ALBUMIN 4.1 07/31/2015   ALKPHOS 93 07/31/2015   LIPASE 23 07/15/2017    Electrolytes Lab Results  Component Value Date   NA 144 07/01/2019   K 4.8 07/01/2019   CL 103 07/01/2019   CALCIUM 10.3 (H) 07/01/2019    Bone No results found for: VD25OH, VD125OH2TOT, [id number], HB7169CV8, 25OHVITD1, 25OHVITD2, 25OHVITD3, TESTOFREE, TESTOSTERONE  Inflammation (CRP: Acute Phase) (ESR: Chronic Phase) No results found for: CRP, ESRSEDRATE, LATICACIDVEN    Note: Above Lab results reviewed.  Imaging  DG SWALLOW FUNC OP MEDICARE SPEECH PATH Please refer to "Notes" tab for Speech Pathology notes.  CLINICAL DATA:  Dysphagia.  EXAM: MODIFIED BARIUM SWALLOW  TECHNIQUE: Different consistencies of barium were administered orally to the patient by the Speech Pathologist. Imaging of the pharynx was performed in the lateral projection. The radiologist was present in the fluoroscopy room for this study, providing personal supervision.  FLUOROSCOPY TIME:  Fluoroscopy Time:  0.8 minutes  Radiation Exposure Index (if provided by the fluoroscopic device): 1.2 mGy  Number of Acquired Spot Images: 0  COMPARISON:  None.  FINDINGS: Real-time fluoroscopy was utilized for evaluation of the swallow function with a speech pathologist present. Multiple consistencies of barium were administered to the patient.  Consistencies included thin, nectar, applesauce, cracker and barium tablet. Transient delay in passage of the barium tablet through the upper esophagus. After the transient delay, the barium tablet transit through the esophagus into the stomach without delay. Otherwise no focal abnormality. No laryngeal penetration or tracheal aspiration.  Incidental note made of anterior and posterior spinal fusion hardware.  IMPRESSION: Modified barium swallow as described above.  Please refer to the Speech Pathologists report for complete details and recommendations.  Electronically Signed   By: Kathreen Devoid   On: 04/10/2019 13:12  Assessment  The primary encounter diagnosis was Chronic pain syndrome. Diagnoses of Cervical myofascial pain syndrome, Cervical spondylosis without myelopathy, Cervical fusion syndrome (C2-T1), Hx of cervical spine surgery, and Occipital headache were also pertinent to this visit.  Plan of Care   Kimberly [name] has a current medication list which includes the following long-term medication(s): escitalopram, liothyronine, nortriptyline, and duloxetine.  Orders:  Orders Placed This Encounter  Procedures  . TRIGGER POINT INJECTION    Standing Status:   Future    Standing Expiration Date:   10/22/2019    Scheduling Instructions:     Cervical TPI in 2 weeks    Order Specific Question:   Where will this procedure be performed?    Answer:   [facility]   Follow-up plan:   Return in about 2 weeks (around 10/06/2019) for TPI.     cervical  TPI, cervical facet medial branch nerve block, suprascapular nerve block      Recent Visits No visits were found meeting these conditions.  Showing recent visits within past 90 days and meeting all other requirements   Today's Visits Date Type Provider Dept  09/22/19 Office Visit Gillis Santa, MD [facility]  Showing today's visits and meeting all other requirements   Future Appointments No visits were  found meeting these conditions.  Showing future appointments within next 90 days and meeting all other requirements   I discussed the assessment and treatment plan with the patient. The patient was provided an opportunity to ask questions and all were answered. The patient agreed with the plan and demonstrated an understanding of the instructions.  Patient advised to call back or seek an in-person evaluation if the symptoms or condition worsens.  Duration of encounter:67mnutes.  Note by: BGillis Santa MD Date: 09/22/2019; Time: 1:42 PM

## 2019-09-23 DIAGNOSIS — E039 Hypothyroidism, unspecified: Secondary | ICD-10-CM | POA: Diagnosis not present

## 2019-09-24 DIAGNOSIS — Z03818 Encounter for observation for suspected exposure to other biological agents ruled out: Secondary | ICD-10-CM | POA: Diagnosis not present

## 2019-09-25 DIAGNOSIS — J358 Other chronic diseases of tonsils and adenoids: Secondary | ICD-10-CM | POA: Diagnosis not present

## 2019-09-25 DIAGNOSIS — J039 Acute tonsillitis, unspecified: Secondary | ICD-10-CM | POA: Diagnosis not present

## 2019-10-03 DIAGNOSIS — Z01812 Encounter for preprocedural laboratory examination: Secondary | ICD-10-CM | POA: Diagnosis not present

## 2019-10-03 DIAGNOSIS — Z20822 Contact with and (suspected) exposure to covid-19: Secondary | ICD-10-CM | POA: Diagnosis not present

## 2019-10-05 DIAGNOSIS — N6011 Diffuse cystic mastopathy of right breast: Secondary | ICD-10-CM | POA: Diagnosis not present

## 2019-10-05 DIAGNOSIS — N6082 Other benign mammary dysplasias of left breast: Secondary | ICD-10-CM | POA: Diagnosis not present

## 2019-10-05 DIAGNOSIS — N6032 Fibrosclerosis of left breast: Secondary | ICD-10-CM | POA: Diagnosis not present

## 2019-10-05 DIAGNOSIS — K219 Gastro-esophageal reflux disease without esophagitis: Secondary | ICD-10-CM | POA: Diagnosis not present

## 2019-10-05 DIAGNOSIS — N6081 Other benign mammary dysplasias of right breast: Secondary | ICD-10-CM | POA: Diagnosis not present

## 2019-10-05 DIAGNOSIS — R921 Mammographic calcification found on diagnostic imaging of breast: Secondary | ICD-10-CM | POA: Diagnosis not present

## 2019-10-05 DIAGNOSIS — M542 Cervicalgia: Secondary | ICD-10-CM | POA: Diagnosis not present

## 2019-10-05 DIAGNOSIS — N6012 Diffuse cystic mastopathy of left breast: Secondary | ICD-10-CM | POA: Diagnosis not present

## 2019-10-05 DIAGNOSIS — N6092 Unspecified benign mammary dysplasia of left breast: Secondary | ICD-10-CM | POA: Diagnosis not present

## 2019-10-05 DIAGNOSIS — R920 Mammographic microcalcification found on diagnostic imaging of breast: Secondary | ICD-10-CM | POA: Diagnosis not present

## 2019-10-05 DIAGNOSIS — M549 Dorsalgia, unspecified: Secondary | ICD-10-CM | POA: Diagnosis not present

## 2019-10-05 DIAGNOSIS — M25519 Pain in unspecified shoulder: Secondary | ICD-10-CM | POA: Diagnosis not present

## 2019-10-05 DIAGNOSIS — N62 Hypertrophy of breast: Secondary | ICD-10-CM | POA: Diagnosis not present

## 2019-10-06 DIAGNOSIS — R920 Mammographic microcalcification found on diagnostic imaging of breast: Secondary | ICD-10-CM | POA: Diagnosis not present

## 2019-10-06 DIAGNOSIS — M25519 Pain in unspecified shoulder: Secondary | ICD-10-CM | POA: Diagnosis not present

## 2019-10-06 DIAGNOSIS — K219 Gastro-esophageal reflux disease without esophagitis: Secondary | ICD-10-CM | POA: Diagnosis not present

## 2019-10-06 DIAGNOSIS — N62 Hypertrophy of breast: Secondary | ICD-10-CM | POA: Diagnosis not present

## 2019-10-06 DIAGNOSIS — N6032 Fibrosclerosis of left breast: Secondary | ICD-10-CM | POA: Diagnosis not present

## 2019-10-06 DIAGNOSIS — M542 Cervicalgia: Secondary | ICD-10-CM | POA: Diagnosis not present

## 2019-10-06 DIAGNOSIS — N6082 Other benign mammary dysplasias of left breast: Secondary | ICD-10-CM | POA: Diagnosis not present

## 2019-10-06 DIAGNOSIS — N6081 Other benign mammary dysplasias of right breast: Secondary | ICD-10-CM | POA: Diagnosis not present

## 2019-10-07 ENCOUNTER — Telehealth: Payer: Self-pay

## 2019-10-07 ENCOUNTER — Ambulatory Visit: Payer: Medicare Other | Admitting: Student in an Organized Health Care Education/Training Program

## 2019-10-07 ENCOUNTER — Telehealth: Payer: Self-pay | Admitting: *Deleted

## 2019-10-07 NOTE — Telephone Encounter (Signed)
Talked with patient. Kimberly [name] had breast reduction on 10/05/19 and received Oxy. 5mg  for 5 days. Informed her of her next appt with Dr Holley Raring 3/24 for TPI. Patient with understand

## 2019-10-19 DIAGNOSIS — K5903 Drug induced constipation: Secondary | ICD-10-CM | POA: Diagnosis not present

## 2019-10-19 DIAGNOSIS — Z79899 Other long term (current) drug therapy: Secondary | ICD-10-CM | POA: Diagnosis not present

## 2019-10-21 ENCOUNTER — Ambulatory Visit: Payer: Medicare Other | Admitting: Student in an Organized Health Care Education/Training Program

## 2019-10-21 DIAGNOSIS — N6489 Other specified disorders of breast: Secondary | ICD-10-CM | POA: Diagnosis not present

## 2019-10-21 DIAGNOSIS — N62 Hypertrophy of breast: Secondary | ICD-10-CM | POA: Diagnosis not present

## 2019-10-29 HISTORY — PX: REDUCTION MAMMAPLASTY: SUR839

## 2019-11-03 ENCOUNTER — Encounter: Payer: Self-pay | Admitting: Student in an Organized Health Care Education/Training Program

## 2019-11-04 ENCOUNTER — Ambulatory Visit
Payer: Medicare Other | Attending: Student in an Organized Health Care Education/Training Program | Admitting: Student in an Organized Health Care Education/Training Program

## 2019-11-04 ENCOUNTER — Encounter: Payer: Self-pay | Admitting: Student in an Organized Health Care Education/Training Program

## 2019-11-04 ENCOUNTER — Other Ambulatory Visit: Payer: Self-pay

## 2019-11-04 DIAGNOSIS — M47812 Spondylosis without myelopathy or radiculopathy, cervical region: Secondary | ICD-10-CM

## 2019-11-04 DIAGNOSIS — G894 Chronic pain syndrome: Secondary | ICD-10-CM | POA: Diagnosis not present

## 2019-11-04 DIAGNOSIS — M7918 Myalgia, other site: Secondary | ICD-10-CM

## 2019-11-04 DIAGNOSIS — R519 Headache, unspecified: Secondary | ICD-10-CM

## 2019-11-04 DIAGNOSIS — Z9889 Other specified postprocedural states: Secondary | ICD-10-CM | POA: Diagnosis not present

## 2019-11-04 DIAGNOSIS — Q761 Klippel-Feil syndrome: Secondary | ICD-10-CM | POA: Diagnosis not present

## 2019-11-04 MED ORDER — OXYCODONE HCL 10 MG PO TABS: 10.0000 mg | ORAL_TABLET | Freq: Two times a day (BID) | ORAL | 0 refills | Status: DC | PRN

## 2019-11-04 NOTE — Progress Notes (Signed)
Patient: Kimberly [name]  Service Category: E/M  Provider: Gillis Santa, MD  DOB: 06-28-1961  DOS: 11/04/2019  Location: Office  MRN: [id number]  Setting: Ambulatory outpatient  Referring Provider: Dion Body, MD  Type: Established Patient  Specialty: Interventional Pain Management  PCP: Dion Body, MD  Location: Home  Delivery: TeleHealth     Virtual Encounter - Pain Management PROVIDER NOTE: Information contained herein reflects review and annotations entered in association with encounter. Interpretation of such information and data should be left to medically-trained personnel. Information provided to patient can be located elsewhere in the medical record under "Patient Instructions". Document created using STT-dictation technology, any transcriptional errors that may result from process are unintentional.    Contact & Pharmacy Preferred: [phone] Home: [phone] (home) Mobile: [phone] (mobile) E-mail: [email or web address]  [place], [place] - [address] [address] Phone: [phone] Fax: [phone]  [facility] ([place], [address] [place] [address] Phone: [phone] Fax: [phone]   Pre-screening  Ms. Gillentine offered "in-person" vs "virtual" encounter. She indicated preferring virtual for this encounter.   Reason COVID-19*  Social distancing based on CDC and AMA recommendations.   I contacted [name] on 11/04/2019 via telephone.      I clearly identified myself as Gillis Santa, MD. I verified that I was speaking with the correct person using two identifiers (Name: Kimberly [name], and date of birth: 01/24/1961).  This visit was completed via telephone due to the restrictions of the COVID-19 pandemic. All issues as above were discussed and addressed but no physical exam was performed. If it was felt that the  patient should be evaluated in the office, they were directed there. The patient verbally consented to this visit. Patient was unable to complete an audio/visual visit due to Technical difficulties and/or Lack of internet. Due to the catastrophic nature of the COVID-19 pandemic, this visit was done through audio contact only.  Location of the patient: home address (see Epic for details)  Location of the provider: office  Consent I sought verbal advanced consent from [name] for virtual visit interactions. I informed Ms. Dark of possible security and privacy concerns, risks, and limitations associated with providing "not-in-person" medical evaluation and management services. I also informed Ms. Winberg of the availability of "in-person" appointments. Finally, I informed her that there would be a charge for the virtual visit and that she could be  personally, fully or partially, financially responsible for it. [name] expressed understanding and agreed to proceed.   Historic Elements   [name] is a 59 y.o. year old, female patient evaluated today after her last contact with our practice on 10/07/2019. Kimberly [name]  has a past medical history of Anxiety, Arthritis, Asthma, Balance problem, Brain cyst, Depression, Difficult intubation, Dyspnea, Flu (08/10/2018), GERD (gastroesophageal reflux disease), Headache, Herpes, History of kidney stones, History of pneumonia, Hypertension, Inflammation of shoulder joint, Memory difficulties, Neuromuscular disorder ([place]), Pneumothorax on right, PONV (postoperative nausea and vomiting), Recurrent falls, and Urinary frequency. She also  has a past surgical history that includes Abdominal hysterectomy; Rotator cuff repair (Left); Foreign Body Removal (Left); Carpal tunnel release (Left, 02/16/2013); Carpal tunnel release (Right, 03/27/2013); Cesarean section; Anterior cervical decomp/discectomy fusion (N/A, 08/06/2014); Colonoscopy; Nasal sinus  surgery (N/A, 04/13/2015); Shoulder arthroscopy with distal clavicle resection (Left, 05/18/2015); Shoulder acromioplasty (Left, 05/18/2015); Cholecystectomy (N/A, 06/17/2015); Neck surgery (2016);  Anterior cervical decomp/discectomy fusion (N/A, 07/04/2016); Posterior cervical fusion/foraminotomy (N/A, 11/13/2016); Knee arthroscopy; Extracorporeal shock wave lithotripsy (Left, 09/04/2018); and Colonoscopy with propofol (N/A, 05/06/2019). Kimberly [name] has a current medication list which includes the following prescription(s): acetaminophen, acyclovir, alprazolam, amlodipine, amoxicillin, cholecalciferol, clotrimazole, cyclobenzaprine, diazepam, diclofenac, duloxetine, escitalopram, estradiol, gabapentin, liothyronine, meclizine, meloxicam, methocarbamol, nortriptyline, nystatin, oxybutynin, pantoprazole, pregabalin, tetrahydrozoline hcl, valacyclovir, diclofenac sodium, oxycodone hcl, [START ON 12/04/2019] oxycodone hcl, and prednisone. She  reports that she has never smoked. She has never used smokeless tobacco. She reports that she does not drink alcohol or use drugs. [name] is allergic to shellfish allergy; peanut-containing drug products; codeine; diphtheria toxoid; and tetanus toxoids.   HPI  Today, she is being contacted for medication management.   Since the patient's last visit with me, she had breast reduction surgery with Dr. Myriam Jacobson at [facility].  She states that the surgery went well and she is recovering although she continues to have mild to moderate pain at times.  She states that it is managed on oxycodone 10 mg as prescribed by Dr. Myriam Jacobson.  Patient states that she prefers oxycodone over Percocet which she was on previously for her chronic pain from total cervical spine fusion.  She states that oxycodone does not cause her to itch like Percocet did.  Will provide refill as below, oxycodone 10 mg twice daily as needed, quantity 60/month and hopefully wean at her next visit with me.  Pharmacotherapy  Assessment  Analgesic: Percocet 10 mg twice daily as needed  Monitoring:  PMP: PDMP reviewed during this encounter.       Pharmacotherapy: No side-effects or adverse reactions reported. Compliance: No problems identified. Effectiveness: Clinically acceptable. Plan: Refer to "POC".  UDS:  Summary  Date Value Ref Range Status  02/03/2019 Note  Final    Comment:    ==================================================================== [employer] Select 13 (MW) ==================================================================== Test                             Result       Flag       Units Drug Absent but Declared for Prescription Verification   Diazepam                       Not Detected UNEXPECTED ng/mg creat   Alprazolam                     Not Detected UNEXPECTED ng/mg creat   Oxycodone                      Not Detected UNEXPECTED ng/mg creat ==================================================================== Test                      Result    Flag   Units      Ref Range   Creatinine              234              mg/dL      >=20 ==================================================================== Declared Medications:  The flagging and interpretation on this report are based on the  following declared medications.  Unexpected results may arise from  inaccuracies in the declared medications.  **Note: The testing scope of this panel includes these medications:  Alprazolam  Diazepam  Oxycodone (Percocet)  **Note: The testing scope of this panel does not include the  following reported medications:  Acetaminophen  Acetaminophen (Percocet)  Acyclovir  Amlodipine  Cholecalciferol  Clotrimazole  Cyclobenzaprine  Diclofenac  Duloxetine  Escitalopram  Estradiol (Estrace)  Eye Drops  Gabapentin  Liothyronine  Meclizine  Meloxicam  Nortriptyline  Nystatin  Oxybutynin  Pantoprazole (Protonix)  Prednisone  (Deltasone) ==================================================================== For clinical consultation, please call [phone]. ====================================================================    Laboratory Chemistry Profile   Renal Lab Results  Component Value Date   BUN 9 07/01/2019   CREATININE 0.86 07/01/2019   BCR 10 07/01/2019   GFRAA 86 07/01/2019   GFRNONAA 75 07/01/2019     Hepatic Lab Results  Component Value Date   AST 26 07/31/2015   ALT 17 07/31/2015   ALBUMIN 4.1 07/31/2015   ALKPHOS 93 07/31/2015   LIPASE 23 07/15/2017     Electrolytes Lab Results  Component Value Date   NA 144 07/01/2019   K 4.8 07/01/2019   CL 103 07/01/2019   CALCIUM 10.3 (H) 07/01/2019     Bone No results found for: VD25OH, VD125OH2TOT, IH4742VZ5, GL8756EP3, 25OHVITD1, 25OHVITD2, 25OHVITD3, TESTOFREE, TESTOSTERONE   Inflammation (CRP: Acute Phase) (ESR: Chronic Phase) No results found for: CRP, ESRSEDRATE, LATICACIDVEN     Note: Above Lab results reviewed.  Imaging  DG SWALLOW FUNC OP MEDICARE SPEECH PATH Please refer to "Notes" tab for Speech Pathology notes.  CLINICAL DATA:  Dysphagia.  EXAM: MODIFIED BARIUM SWALLOW  TECHNIQUE: Different consistencies of barium were administered orally to the patient by the Speech Pathologist. Imaging of the pharynx was performed in the lateral projection. The radiologist was present in the fluoroscopy room for this study, providing personal supervision.  FLUOROSCOPY TIME:  Fluoroscopy Time:  0.8 minutes  Radiation Exposure Index (if provided by the fluoroscopic device): 1.2 mGy  Number of Acquired Spot Images: 0  COMPARISON:  None.  FINDINGS: Real-time fluoroscopy was utilized for evaluation of the swallow function with a speech pathologist present. Multiple consistencies of barium were administered to the patient. Consistencies included thin, nectar, applesauce, cracker and barium tablet. Transient delay in  passage of the barium tablet through the upper esophagus. After the transient delay, the barium tablet transit through the esophagus into the stomach without delay. Otherwise no focal abnormality. No laryngeal penetration or tracheal aspiration.  Incidental note made of anterior and posterior spinal fusion hardware.  IMPRESSION: Modified barium swallow as described above.  Please refer to the Speech Pathologists report for complete details and recommendations.  Electronically Signed   By: Kathreen Devoid   On: 04/10/2019 13:12  Assessment  The primary encounter diagnosis was Chronic pain syndrome. Diagnoses of Cervical myofascial pain syndrome, Cervical spondylosis without myelopathy, Cervical fusion syndrome (C2-T1), Hx of cervical spine surgery, and Occipital headache were also pertinent to this visit.  Plan of Care   Ms. ERYNNE KEALEY has a current medication list which includes the following long-term medication(s): duloxetine, escitalopram, liothyronine, and nortriptyline.  Pharmacotherapy (Medications Ordered): Meds ordered this encounter  Medications  . Oxycodone HCl 10 MG TABS    Sig: Take 1 tablet (10 mg total) by mouth every 12 (twelve) hours as needed. Must last 30 days.    Dispense:  60 tablet    Refill:  0    Chronic Pain. (STOP Act - Not applicable). Fill one day early if closed on scheduled refill date.  . Oxycodone HCl 10 MG TABS    Sig: Take 1 tablet (10 mg total) by mouth every 12 (twelve) hours as needed. Must last 30 days.    Dispense:  60 tablet  Refill:  0    Chronic Pain. (STOP Act - Not applicable). Fill one day early if closed on scheduled refill date.   Continue all other multimodal analgesics as prescribed  Follow-up plan:   Return in about 8 weeks (around 12/30/2019) for Medication Management, in person.     cervical TPI, cervical facet medial branch nerve block, suprascapular nerve block       Recent Visits Date Type Provider Dept  09/22/19  Office Visit Gillis Santa, MD [facility]  Showing recent visits within past 90 days and meeting all other requirements   Today's Visits Date Type Provider Dept  11/04/19 Office Visit Gillis Santa, MD [facility]  Showing today's visits and meeting all other requirements   Future Appointments No visits were found meeting these conditions.  Showing future appointments within next 90 days and meeting all other requirements   I discussed the assessment and treatment plan with the patient. The patient was provided an opportunity to ask questions and all were answered. The patient agreed with the plan and demonstrated an understanding of the instructions.  Patient advised to call back or seek an in-person evaluation if the symptoms or condition worsens.  Duration of encounter: 89mnutes.  Note by: BGillis Santa MD Date: 11/04/2019; Time: 12:00 PM

## 2019-11-09 DIAGNOSIS — N951 Menopausal and female climacteric states: Secondary | ICD-10-CM | POA: Diagnosis not present

## 2019-12-01 IMAGING — CR DG CHEST 2V
2 series · 2 of 2 positions shown · non-contrast
Comparison: Radiograph 02/17/2017

CLINICAL DATA: Left-sided chest pain.

EXAM:
CHEST  2 VIEW

[chest pa]
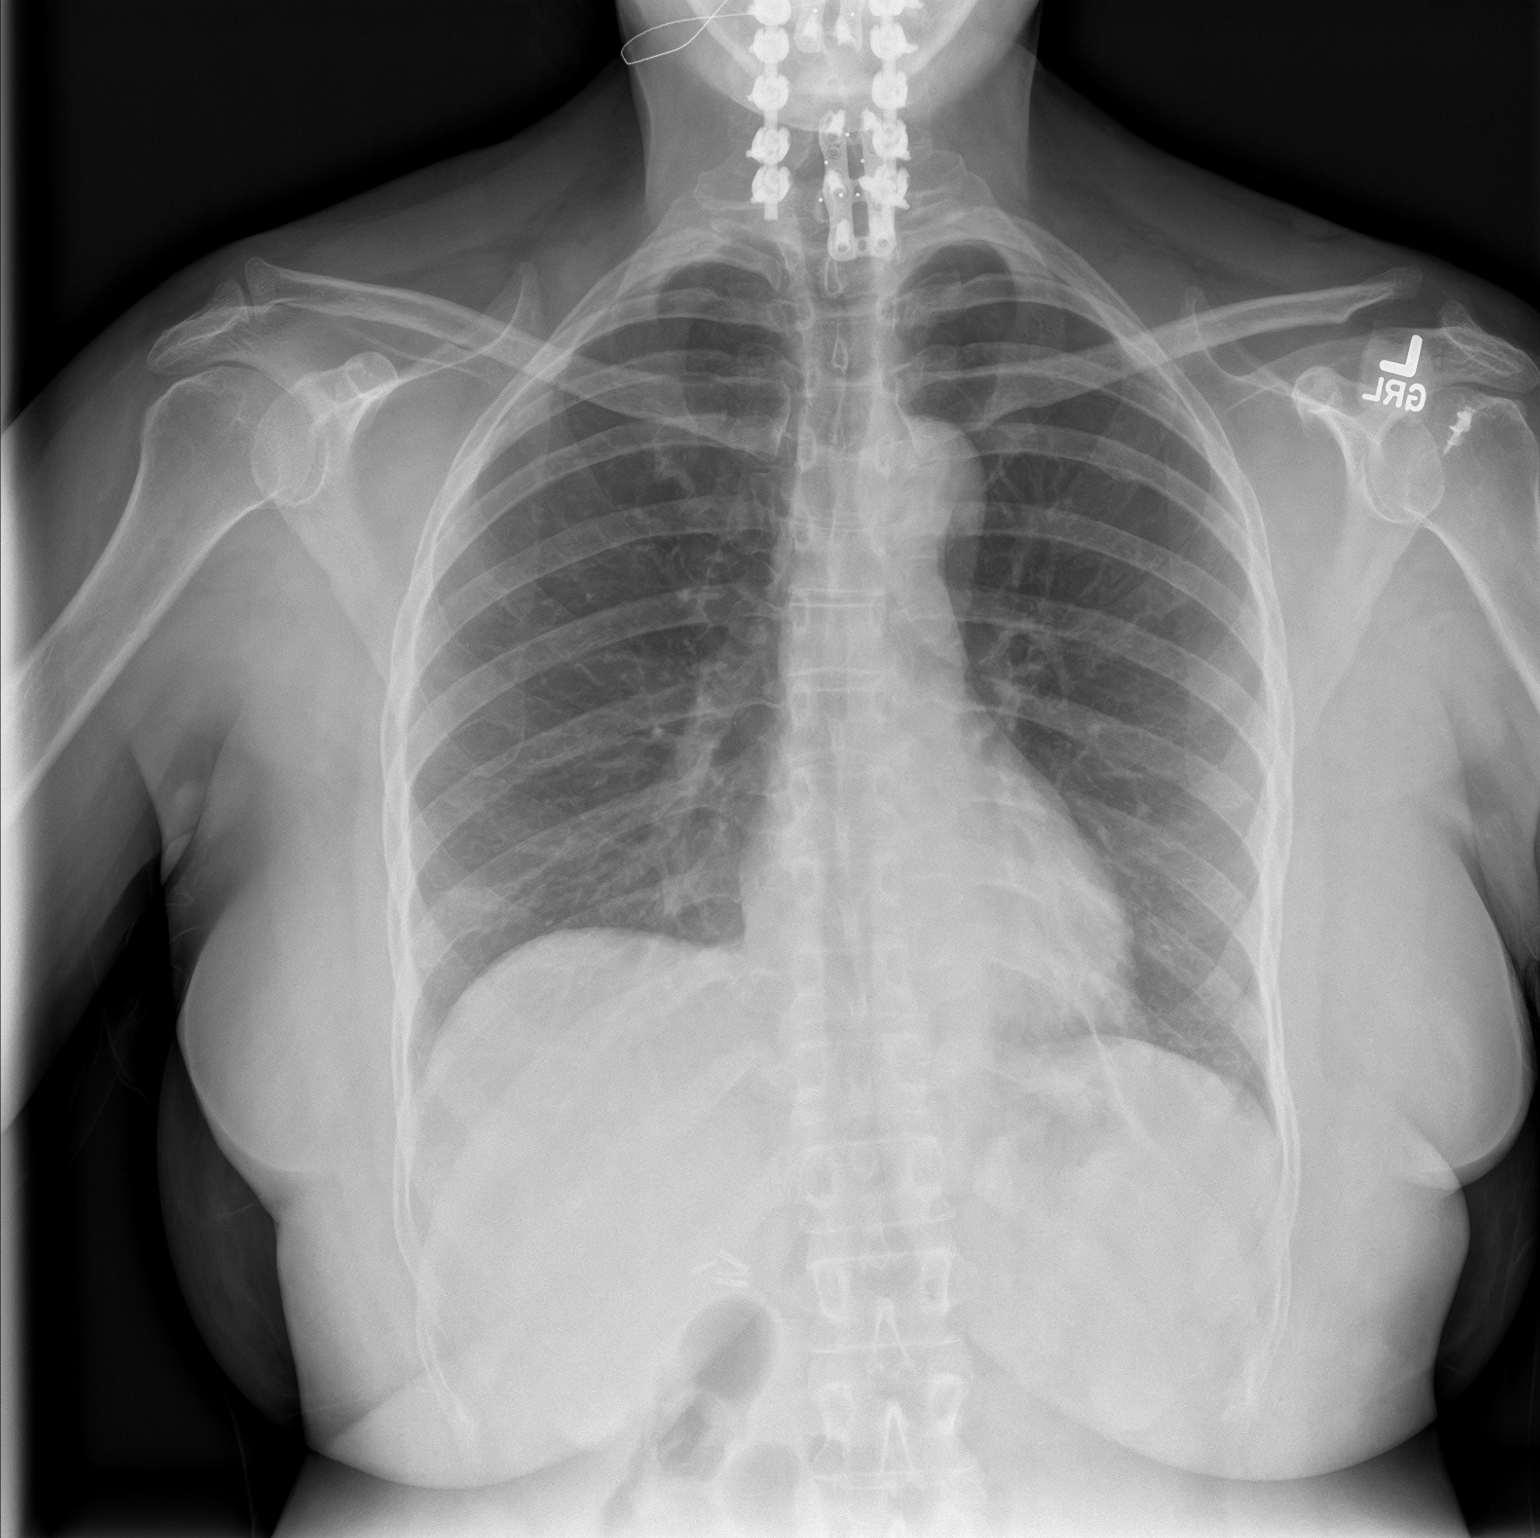

[chest lat]
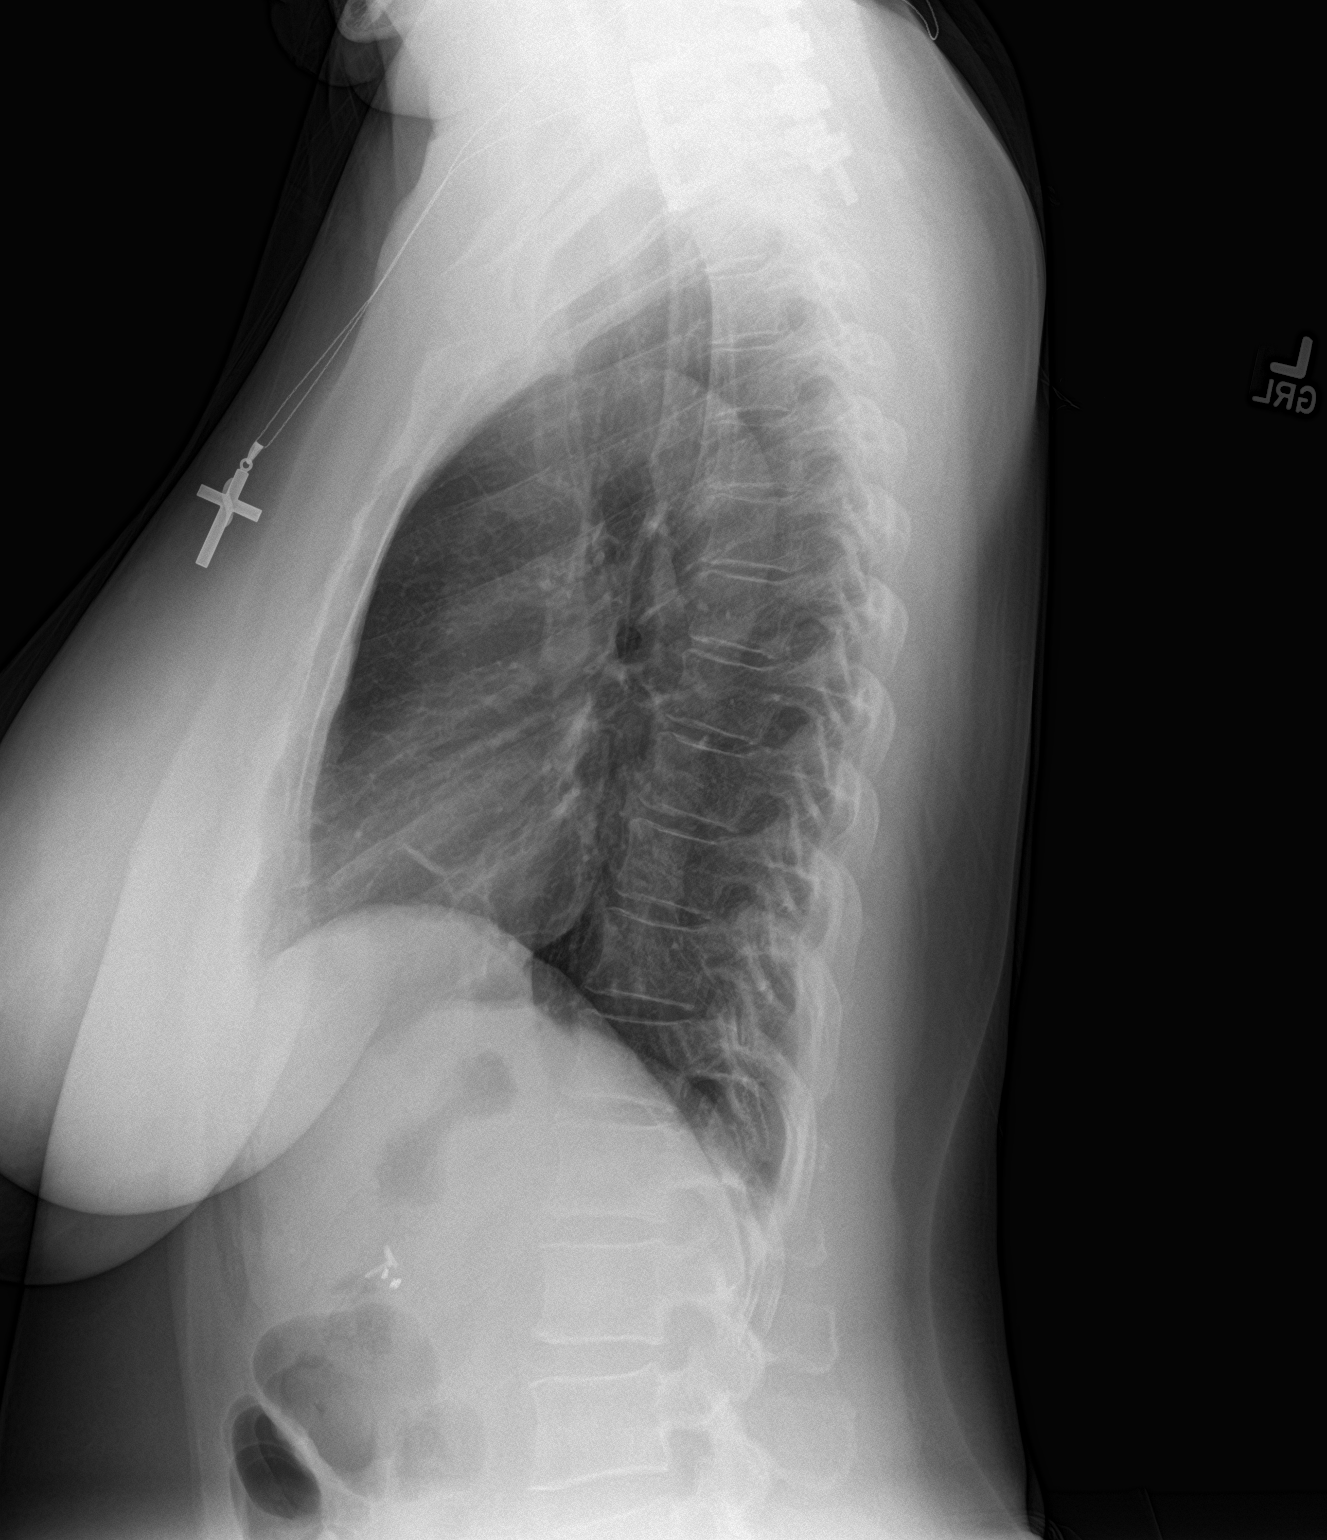

[2 of 2 positions shown; findings below may reference images not displayed]

FINDINGS: The cardiomediastinal contours are normal. The lungs are clear.
Pulmonary vasculature is normal. No consolidation, pleural effusion,
or pneumothorax. No acute osseous abnormalities are seen.
Postsurgical change in the cervical spine. Cholecystectomy clips in
the upper abdomen.
IMPRESSION: No acute pulmonary process.

## 2019-12-29 ENCOUNTER — Other Ambulatory Visit: Payer: Self-pay

## 2019-12-29 ENCOUNTER — Encounter: Payer: Self-pay | Admitting: Student in an Organized Health Care Education/Training Program

## 2019-12-29 ENCOUNTER — Ambulatory Visit
Payer: Medicare Other | Attending: Student in an Organized Health Care Education/Training Program | Admitting: Student in an Organized Health Care Education/Training Program

## 2019-12-29 VITALS — BP 113/82 | HR 102 | Temp 97.3°F | Resp 18 | Ht 61.0 in | Wt 145.0 lb

## 2019-12-29 DIAGNOSIS — G894 Chronic pain syndrome: Secondary | ICD-10-CM

## 2019-12-29 DIAGNOSIS — M5030 Other cervical disc degeneration, unspecified cervical region: Secondary | ICD-10-CM

## 2019-12-29 DIAGNOSIS — Z9889 Other specified postprocedural states: Secondary | ICD-10-CM

## 2019-12-29 DIAGNOSIS — R519 Headache, unspecified: Secondary | ICD-10-CM | POA: Diagnosis not present

## 2019-12-29 DIAGNOSIS — Q761 Klippel-Feil syndrome: Secondary | ICD-10-CM

## 2019-12-29 DIAGNOSIS — M47812 Spondylosis without myelopathy or radiculopathy, cervical region: Secondary | ICD-10-CM

## 2019-12-29 DIAGNOSIS — M7918 Myalgia, other site: Secondary | ICD-10-CM | POA: Diagnosis not present

## 2019-12-29 MED ORDER — OXYCODONE HCL 10 MG PO TABS: 10.0000 mg | ORAL_TABLET | Freq: Two times a day (BID) | ORAL | 0 refills | Status: DC | PRN

## 2019-12-29 NOTE — Progress Notes (Signed)
PROVIDER NOTE: Information contained herein reflects review and annotations entered in association with encounter. Interpretation of such information and data should be left to medically-trained personnel. Information provided to patient can be located elsewhere in the medical record under "Patient Instructions". Document created using STT-dictation technology, any transcriptional errors that may result from process are unintentional.    Patient: Kimberly [name]  Service Category: E/M  Provider: Gillis Santa, MD  DOB: 22-Jul-1961  DOS: 12/29/2019  Referring Provider: Dion Body, MD  MRN: [id number]  Setting: Ambulatory outpatient  PCP: Dion Body, MD  Type: Established Patient  Specialty: Interventional Pain Management    Location: Office  Delivery: Face-to-face     Primary Reason(s) for Visit: Encounter for prescription drug management. (Level of risk: moderate)  CC: Headache and Breast Pain (right)  HPI  Kimberly [name] is a 59 y.o. year old, female patient, who comes today for a medication management evaluation. She has SHOULDER PAIN; IMPINGEMENT SYNDROME; RUPTURE ROTATOR CUFF; HTN (hypertension); S/P carpal tunnel release; CTS (carpal tunnel syndrome); Cervical spondylosis without myelopathy; Muscle weakness (generalized); Tight fascia; Decreased range of motion of shoulder; Cervical stenosis of spinal canal; Asthma; Acute chest wall pain; GERD (gastroesophageal reflux disease); Spontaneous pneumothorax; Pseudoarthrosis of cervical spine ([place]); Bilateral hand pain; Degenerative disc disease, cervical; Dizziness; Dysphagia; History of recent fall; Hypothyroidism (acquired); Loss of memory; Medication overuse headache; Chronic tension-type headache, not intractable; Numbness and tingling; Reaction to severe stress; Vasovagal syncope; Vitamin D deficiency; Cervical fusion syndrome; Hx of cervical spine surgery; Chronic pain syndrome; Cervicalgia; Controlled substance agreement signed;  Depression, major, recurrent, mild ([place]); Depression, major, single episode, complete remission ([place]); Pelvic pain in female; Poorly-controlled hypertension; Recurrent herpes simplex; Cervical myofascial pain syndrome; and Occipital headache on their problem list. Her primarily concern today is the Headache and Breast Pain (right)  Pain Assessment: Location: Upper Neck Radiating: denies Onset: More than a month ago Duration: Chronic pain Quality: Throbbing Severity: 6 /10 (subjective, self-reported pain score)  Note: Reported level is compatible with observation.                         When using our objective Pain Scale, levels between 6 and 10/10 are said to belong in an emergency room, as it progressively worsens from a 6/10, described as severely limiting, requiring emergency care not usually available at an outpatient pain management facility. At a 6/10 level, communication becomes difficult and requires great effort. Assistance to reach the emergency department may be required. Facial flushing and profuse sweating along with potentially dangerous increases in heart rate and blood pressure will be evident. Effect on ADL: limits activities Timing: Constant Modifying factors: denies BP: 113/82  HR: (!) 102  Kimberly [name] for an appointment on 11/04/2019 for medication management. During today's appointment we reviewed Kimberly [name] chronic pain status, as well as her outpatient medication regimen.  No change in medical history since last visit.  Patient's pain is at baseline.  Patient continues multimodal pain regimen as prescribed.  States that it provides pain relief and improvement in functional status.  +posterior dominant headaches and neck pain well managed on her current regimen.  The patient  reports no history of drug use. Her body mass index is 27.4 kg/m.  Further details on both, my assessment(s), as well as the proposed treatment plan, please see  below.  Controlled Substance Pharmacotherapy Assessment REMS (Risk Evaluation and Mitigation Strategy)  Analgesic:  12/15/2019 2 11/04/2019  Oxycodone Hcl  10 MG Tablet  60.00 30 [place] [address]" data-toggle="tooltip" data-html="true" Bi Lat [id number] [place] [address]" data-toggle="tooltip" data-html="true" [facility] (1123) 0 30.00 MME Medicare [place]    Dewayne Shorter, RN  12/29/2019 11:34 AM  Signed Nursing Pain Medication Assessment:  Safety precautions to be maintained throughout the outpatient stay will include: orient to surroundings, keep bed in low position, maintain call bell within reach at all times, provide assistance with transfer out of bed and ambulation.  Medication Inspection Compliance: Pill count conducted under aseptic conditions, in front of the patient. Neither the pills nor the bottle was removed from the patient's sight at any time. Once count was completed pills were immediately returned to the patient in their original bottle.  Medication: Oxycodone IR Pill/Patch Count: 36 of 60 pills remain Pill/Patch Appearance: Markings consistent with prescribed medication Bottle Appearance: Standard pharmacy container. Clearly labeled. Filled Date: 05 /18 / 2021 Last Medication intake:  Today   Pharmacokinetics: Liberation and absorption (onset of action): WNL Distribution (time to peak effect): WNL Metabolism and excretion (duration of action): WNL         Pharmacodynamics: Desired effects: Analgesia: Kimberly [name] reports >50% benefit. Functional ability: Patient reports that medication allows her to accomplish basic ADLs Clinically meaningful improvement in function (CMIF): Sustained CMIF goals met Perceived effectiveness: Described as relatively effective, allowing for increase in activities of daily living (ADL) Undesirable effects: Side-effects or Adverse reactions: None reported Monitoring: [place] PMP: PDMP reviewed during this  encounter. Online review of the past 58-monthperiod conducted. Compliant with practice rules and regulations Last UDS on record: Summary  Date Value Ref Range Status  02/03/2019 Note  Final    Comment:    ==================================================================== [employer] Select 13 (MW) ==================================================================== Test                             Result       Flag       Units Drug Absent but Declared for Prescription Verification   Diazepam                       Not Detected UNEXPECTED ng/mg creat   Alprazolam                     Not Detected UNEXPECTED ng/mg creat   Oxycodone                      Not Detected UNEXPECTED ng/mg creat ==================================================================== Test                      Result    Flag   Units      Ref Range   Creatinine              234              mg/dL      >=20 ==================================================================== Declared Medications:  The flagging and interpretation on this report are based on the  following declared medications.  Unexpected results may arise from  inaccuracies in the declared medications.  **Note: The testing scope of this panel includes these medications:  Alprazolam  Diazepam  Oxycodone (Percocet)  **Note: The testing scope of this panel does not include the  following reported medications:  Acetaminophen  Acetaminophen (Percocet)  Acyclovir  Amlodipine  Cholecalciferol  Clotrimazole  Cyclobenzaprine  Diclofenac  Duloxetine  Escitalopram  Estradiol (Estrace)  Eye Drops  Gabapentin  Liothyronine  Meclizine  Meloxicam  Nortriptyline  Nystatin  Oxybutynin  Pantoprazole (Protonix)  Prednisone (Deltasone) ==================================================================== For clinical consultation, please call [phone]. ====================================================================    UDS interpretation:  Compliant          Medication Assessment Form: Reviewed. Patient indicates being compliant with therapy Treatment compliance: Compliant Risk Assessment Profile: Aberrant behavior: See initial evaluations. None observed or detected today Comorbid factors increasing risk of overdose: See initial evaluation. No additional risks detected today Opioid risk tool (ORT):  Opioid Risk  12/29/2019  Alcohol 0  Illegal Drugs 0  Rx Drugs 0  Alcohol 0  Illegal Drugs 0  Rx Drugs 0  Age between 16-45 years  -  History of Preadolescent Sexual Abuse 0  Psychological Disease 0  ADD -  OCD -  Bipolar -  Depression 0  Opioid Risk Tool Scoring 0  Opioid Risk Interpretation Low Risk    ORT Scoring interpretation table:  Score <3 = Low Risk for SUD  Score between 4-7 = Moderate Risk for SUD  Score >8 = High Risk for Opioid Abuse   Risk of substance use disorder (SUD): Low  Risk Mitigation Strategies:  Patient Counseling: Covered Patient-Prescriber Agreement (PPA): Present and active  Notification to other healthcare providers: Done  Pharmacologic Plan: No change in therapy, at this time.             Laboratory Chemistry Profile   Renal Lab Results  Component Value Date   BUN 9 07/01/2019   CREATININE 0.86 07/01/2019   BCR 10 07/01/2019   GFRAA 86 07/01/2019   GFRNONAA 75 07/01/2019   SPECGRAV 1.010 01/19/2019   PHUR 7.0 01/19/2019   PROTEINUR Negative 01/19/2019     Electrolytes Lab Results  Component Value Date   NA 144 07/01/2019   K 4.8 07/01/2019   CL 103 07/01/2019   CALCIUM 10.3 (H) 07/01/2019     Hepatic Lab Results  Component Value Date   AST 26 07/31/2015   ALT 17 07/31/2015   ALBUMIN 4.1 07/31/2015   ALKPHOS 93 07/31/2015   LIPASE 23 07/15/2017     ID Lab Results  Component Value Date   HIV NONREACTIVE 12/18/2013   [place] NEGATIVE 05/01/2019   STAPHAUREUS NEGATIVE 11/06/2016   MRSAPCR NEGATIVE 11/06/2016     Bone No results found for: VD25OH,  AX655VZ4MOL, MB8675QG9, EE1007HQ1, 25OHVITD1, 25OHVITD2, 25OHVITD3, TESTOFREE, TESTOSTERONE   Endocrine Lab Results  Component Value Date   GLUCOSE 94 07/01/2019   GLUCOSEU Negative 01/19/2019     Neuropathy Lab Results  Component Value Date   HIV NONREACTIVE 12/18/2013     CNS No results found for: COLORCSF, APPEARCSF, RBCCOUNTCSF, WBCCSF, POLYSCSF, LYMPHSCSF, EOSCSF, PROTEINCSF, GLUCCSF, JCVIRUS, CSFOLI, IGGCSF, LABACHR, ACETBL, LABACHR, ACETBL   Inflammation (CRP: Acute  ESR: Chronic) No results found for: CRP, ESRSEDRATE, LATICACIDVEN   Rheumatology No results found for: RF, ANA, LABURIC, URICUR, LYMEIGGIGMAB, LYMEABIGMQN, HLAB27   Coagulation Lab Results  Component Value Date   PLT 247 07/15/2017   DDIMER 0.28 07/02/2013     Cardiovascular Lab Results  Component Value Date   TROPONINI <0.03 07/15/2017   HGB 13.8 07/15/2017   HCT 41.8 07/15/2017     Screening Lab Results  Component Value Date   SARSCOV2NAA NEGATIVE 05/01/2019   STAPHAUREUS NEGATIVE 11/06/2016   MRSAPCR NEGATIVE 11/06/2016   HIV NONREACTIVE 12/18/2013     Cancer No results found for: CEA, CA125, LABCA2  Allergens No results found for: ALMOND, APPLE, ASPARAGUS, AVOCADO, BANANA, BARLEY, BASIL, BAYLEAF, GREENBEAN, LIMABEAN, WHITEBEAN, BEEFIGE, REDBEET, BLUEBERRY, BROCCOLI, CABBAGE, MELON, CARROT, CASEIN, CASHEWNUT, CAULIFLOWER, CELERY     Note: Lab results reviewed.   Recent Diagnostic Imaging Results  DG SWALLOW FUNC OP MEDICARE SPEECH PATH Please refer to "Notes" tab for Speech Pathology notes.  CLINICAL DATA:  Dysphagia.  EXAM: MODIFIED BARIUM SWALLOW  TECHNIQUE: Different consistencies of barium were administered orally to the patient by the Speech Pathologist. Imaging of the pharynx was performed in the lateral projection. The radiologist was present in the fluoroscopy room for this study, providing personal supervision.  FLUOROSCOPY TIME:  Fluoroscopy Time:  0.8  minutes  Radiation Exposure Index (if provided by the fluoroscopic device): 1.2 mGy  Number of Acquired Spot Images: 0  COMPARISON:  None.  FINDINGS: Real-time fluoroscopy was utilized for evaluation of the swallow function with a speech pathologist present. Multiple consistencies of barium were administered to the patient. Consistencies included thin, nectar, applesauce, cracker and barium tablet. Transient delay in passage of the barium tablet through the upper esophagus. After the transient delay, the barium tablet transit through the esophagus into the stomach without delay. Otherwise no focal abnormality. No laryngeal penetration or tracheal aspiration.  Incidental note made of anterior and posterior spinal fusion hardware.  IMPRESSION: Modified barium swallow as described above.  Please refer to the Speech Pathologists report for complete details and recommendations.  Electronically Signed   By: Kathreen Devoid   On: 04/10/2019 13:12  Complexity Note: Imaging results reviewed. Results shared with Ms. Rye, using Layman's terms.                               Meds   Current Outpatient Medications:  .  acetaminophen (TYLENOL) 500 MG tablet, Take 500 mg by mouth 2 (two) times daily as needed for headache., Disp: , Rfl:  .  ALPRAZolam (XANAX) 0.5 MG tablet, Take 0.5 mg by mouth 3 (three) times daily as needed for anxiety., Disp: , Rfl:  .  amLODipine (NORVASC) 5 MG tablet, Take 5 mg by mouth daily. , Disp: , Rfl:  .  Cholecalciferol (VITAMIN D-1000 MAX ST) 1000 units tablet, Take 1,000 Units by mouth daily., Disp: , Rfl:  .  clotrimazole (LOTRIMIN) 1 % cream, Apply 1 application topically daily as needed (itching). , Disp: , Rfl:  .  cyclobenzaprine (FLEXERIL) 10 MG tablet, Take 1 tablet by mouth as needed for muscle spasms. , Disp: , Rfl:  .  DULoxetine (CYMBALTA) 30 MG capsule, Take 1 capsule (30 mg total) by mouth daily., Disp: 90 capsule, Rfl: 0 .  escitalopram  (LEXAPRO) 10 MG tablet, Take 10 mg by mouth daily as needed (anxiety). , Disp: , Rfl:  .  estradiol (ESTRACE) 0.5 MG tablet, Take 0.5 mg by mouth daily., Disp: , Rfl:  .  gabapentin (NEURONTIN) 100 MG capsule, TWO CAPSULES BY MOUTH EVERY 8 HOURS, Disp: , Rfl:  .  liothyronine (CYTOMEL) 5 MCG tablet, Take 1 tablet by mouth daily., Disp: , Rfl:  .  meloxicam (MOBIC) 7.5 MG tablet, Take 7.5 mg by mouth daily. , Disp: , Rfl:  .  methocarbamol (ROBAXIN) 500 MG tablet, Take 500 mg by mouth every 6 (six) hours as needed. , Disp: , Rfl:  .  nortriptyline (PAMELOR) 10 MG capsule, Take 1 capsule by mouth at bedtime as needed for sleep. , Disp: , Rfl:  .  oxybutynin (DITROPAN-XL) 5 MG 24 hr tablet, Take 5 mg by mouth 2 (two) times daily. , Disp: , Rfl:  .  [START ON 01/14/2020] Oxycodone HCl 10 MG TABS, Take 1 tablet (10 mg total) by mouth every 12 (twelve) hours as needed. Must last 30 days., Disp: 60 tablet, Rfl: 0 .  [START ON 02/13/2020] Oxycodone HCl 10 MG TABS, Take 1 tablet (10 mg total) by mouth every 12 (twelve) hours as needed. Must last 30 days., Disp: 60 tablet, Rfl: 0 .  [START ON 03/14/2020] Oxycodone HCl 10 MG TABS, Take 1 tablet (10 mg total) by mouth every 12 (twelve) hours as needed. Must last 30 days., Disp: 60 tablet, Rfl: 0 .  pantoprazole (PROTONIX) 40 MG tablet, Take 40 mg by mouth daily., Disp: , Rfl:  .  pregabalin (LYRICA) 50 MG capsule, Take 50 mg by mouth 3 (three) times daily., Disp: , Rfl:  .  Tetrahydrozoline HCl (VISINE OP), Apply 1 drop to eye daily as needed (irritation)., Disp: , Rfl:  .  valACYclovir (VALTREX) 500 MG tablet, Take 500 mg by mouth 2 (two) times daily., Disp: , Rfl:  .  acyclovir (ZOVIRAX) 400 MG tablet, Take 400 mg by mouth 2 (two) times daily. , Disp: , Rfl:  .  amoxicillin (AMOXIL) 875 MG tablet, SMARTSIG:1 Tablet(s) By Mouth Every 12 Hours, Disp: , Rfl:  .  diazepam (VALIUM) 10 MG tablet, Take 10 mg by mouth daily as needed for anxiety., Disp: , Rfl:  .   diclofenac (VOLTAREN) 75 MG EC tablet, TAKE 1 TABLET BY MOUTH TWICE DAILY FOR ONE MONTH. THEN DECREASE TO ONE TABLET ONCE A DAY FOR ONE MONTH THEN DISCONTINUE, Disp: , Rfl:  .  meclizine (ANTIVERT) 12.5 MG tablet, TAKE 1 TABLET BY MOUTH THREE TIMES DAILY AS NEEDED FOR DIZZINESS FOR UP TO 10 DAYS., Disp: , Rfl:  .  nystatin (MYCOSTATIN) 100000 UNIT/ML suspension, Take 100,000 Units by mouth daily. Swish and spit, Disp: , Rfl: 0 .  predniSONE (DELTASONE) 10 MG tablet, 52m x 2 days, 314mx 2days, 2075m 2days, 55m76m2days, Disp: , Rfl:   ROS  Constitutional: Denies any fever or chills Gastrointestinal: No reported hemesis, hematochezia, vomiting, or acute GI distress Musculoskeletal: Denies any acute onset joint swelling, redness, loss of ROM, or weakness Neurological: No reported episodes of acute onset apraxia, aphasia, dysarthria, agnosia, amnesia, paralysis, loss of coordination, or loss of consciousness  Allergies  Ms. TerrEgelhoffallergic to shellfish allergy; peanut-containing drug products; codeine; diphtheria toxoid; and tetanus toxoids.  PFSH[place]iv[place]ug: Ms. TerrFeazellports no history of drug use. Alcohol:  reports no history of alcohol use. Tobacco:  reports that she has never smoked. She has never used smokeless tobacco. Medical:  has a past medical history of Anxiety, Arthritis, Asthma, Balance problem, Brain cyst, Depression, Difficult intubation, Dyspnea, Flu (08/10/2018), GERD (gastroesophageal reflux disease), Headache, Herpes, History of kidney stones, History of pneumonia, Hypertension, Inflammation of shoulder joint, Memory difficulties, Neuromuscular disorder (HCC)Elktonneumothorax on right, PONV (postoperative nausea and vomiting), Recurrent falls, and Urinary frequency. Surgical: Ms. TerrTodiscos a past surgical history that includes Abdominal hysterectomy; Rotator cuff repair (Left); Foreign Body Removal (Left); Carpal tunnel release (Left, 02/16/2013); Carpal tunnel release (Right,  03/27/2013); Cesarean section; Anterior cervical decomp/discectomy fusion (N/A, 08/06/2014); Colonoscopy; Nasal sinus surgery (N/A, 04/13/2015); Shoulder arthroscopy with distal clavicle resection (Left, 05/18/2015); Shoulder acromioplasty (Left, 05/18/2015); Cholecystectomy (N/A, 06/17/2015); Neck surgery (2016); Anterior cervical decomp/discectomy fusion (N/A, 07/04/2016); Posterior cervical fusion/foraminotomy (  N/A, 11/13/2016); Knee arthroscopy; Extracorporeal shock wave lithotripsy (Left, 09/04/2018); and Colonoscopy with propofol (N/A, 05/06/2019). Family: family history includes Hypertension in her father and another family member.  Constitutional Exam  General appearance: Well nourished, well developed, and well hydrated. In no apparent acute distress Vitals:   12/29/19 1125  BP: 113/82  Pulse: (!) 102  Resp: 18  Temp: (!) 97.3 F (36.3 C)  SpO2: 99%  Weight: 145 lb (65.8 kg)  Height: '5\' 1"'  (1.549 m)   BMI Assessment: Estimated body mass index is 27.4 kg/m as calculated from the following:   Height as of this encounter: '5\' 1"'  (1.549 m).   Weight as of this encounter: 145 lb (65.8 kg).  BMI interpretation table: BMI level Category Range association with higher incidence of chronic pain  <18 kg/m2 Underweight   18.5-24.9 kg/m2 Ideal body weight   25-29.9 kg/m2 Overweight Increased incidence by 20%  30-34.9 kg/m2 Obese (Class I) Increased incidence by 68%  35-39.9 kg/m2 Severe obesity (Class II) Increased incidence by 136%  >40 kg/m2 Extreme obesity (Class III) Increased incidence by 254%   Patient's current BMI Ideal Body weight  Body mass index is 27.4 kg/m. Ideal body weight: 47.8 kg (105 lb 6.1 oz) Adjusted ideal body weight: 55 kg (121 lb 3.7 oz)   BMI Readings from Last 4 Encounters:  12/29/19 27.40 kg/m  07/08/19 29.82 kg/m  06/18/19 29.49 kg/m  05/06/19 25.13 kg/m   Wt Readings from Last 4 Encounters:  12/29/19 145 lb (65.8 kg)  07/08/19 157 lb 12.8 oz (71.6 kg)   06/18/19 156 lb 1.6 oz (70.8 kg)  05/06/19 151 lb (68.5 kg)    Psych/Mental status: Alert, oriented x 3 (person, place, & time)       Eyes: PERLA Respiratory: No evidence of acute respiratory distress  Cervical Spine Exam  Skin & Axial Inspection: Well healed scar from previous spine surgery detected Alignment: Symmetrical Functional ROM: Pain restricted ROM      Stability: No instability detected Muscle Tone/Strength: Functionally intact. No obvious neuro-muscular anomalies detected. Sensory (Neurological): Neurogenic pain pattern Palpation: No palpable anomalies              Upper Extremity (UE) Exam    Side: Right upper extremity  Side: Left upper extremity  Skin & Extremity Inspection: Skin color, temperature, and hair growth are WNL. No peripheral edema or cyanosis. No masses, redness, swelling, asymmetry, or associated skin lesions. No contractures.  Skin & Extremity Inspection: Skin color, temperature, and hair growth are WNL. No peripheral edema or cyanosis. No masses, redness, swelling, asymmetry, or associated skin lesions. No contractures.  Functional ROM: Unrestricted ROM          Functional ROM: Unrestricted ROM          Muscle Tone/Strength: Functionally intact. No obvious neuro-muscular anomalies detected.  Muscle Tone/Strength: Functionally intact. No obvious neuro-muscular anomalies detected.  Sensory (Neurological): Unimpaired          Sensory (Neurological): Unimpaired          Palpation: No palpable anomalies              Palpation: No palpable anomalies              Provocative Test(s):  Phalen's test: deferred Tinel's test: deferred Apley's scratch test (touch opposite shoulder):  Action 1 (Across chest): deferred Action 2 (Overhead): deferred Action 3 (LB reach): deferred   Provocative Test(s):  Phalen's test: deferred Tinel's test: deferred Apley's scratch test (touch opposite  shoulder):  Action 1 (Across chest): deferred Action 2 (Overhead):  deferred Action 3 (LB reach): deferred    Thoracic Spine Area Exam  Skin & Axial Inspection: No masses, redness, or swelling Alignment: Symmetrical Functional ROM: Unrestricted ROM Stability: No instability detected Muscle Tone/Strength: Functionally intact. No obvious neuro-muscular anomalies detected. Sensory (Neurological): Unimpaired Muscle strength & Tone: No palpable anomalies  Lumbar Exam  Skin & Axial Inspection: No masses, redness, or swelling Alignment: Symmetrical Functional ROM: Unrestricted ROM       Stability: No instability detected Muscle Tone/Strength: Functionally intact. No obvious neuro-muscular anomalies detected. Sensory (Neurological): Unimpaired Palpation: No palpable anomalies       Provocative Tests: Hyperextension/rotation test: deferred today       Lumbar quadrant test (Kemp's test): deferred today       Lateral bending test: deferred today       Patrick's Maneuver: deferred today                   FABER* test: deferred today                   S-I anterior distraction/compression test: deferred today         S-I lateral compression test: deferred today         S-I Thigh-thrust test: deferred today         S-I Gaenslen's test: deferred today         *(Flexion, ABduction and External Rotation)  Gait & Posture Assessment  Ambulation: Unassisted Gait: Relatively normal for age and body habitus Posture: WNL   Lower Extremity Exam    Side: Right lower extremity  Side: Left lower extremity  Stability: No instability observed          Stability: No instability observed          Skin & Extremity Inspection: Skin color, temperature, and hair growth are WNL. No peripheral edema or cyanosis. No masses, redness, swelling, asymmetry, or associated skin lesions. No contractures.  Skin & Extremity Inspection: Skin color, temperature, and hair growth are WNL. No peripheral edema or cyanosis. No masses, redness, swelling, asymmetry, or associated skin lesions. No  contractures.  Functional ROM: Unrestricted ROM                  Functional ROM: Unrestricted ROM                  Muscle Tone/Strength: Functionally intact. No obvious neuro-muscular anomalies detected.  Muscle Tone/Strength: Functionally intact. No obvious neuro-muscular anomalies detected.  Sensory (Neurological): Unimpaired        Sensory (Neurological): Unimpaired        DTR: Patellar: deferred today Achilles: deferred today Plantar: deferred today  DTR: Patellar: deferred today Achilles: deferred today Plantar: deferred today  Palpation: No palpable anomalies  Palpation: No palpable anomalies   Assessment   Status Diagnosis  Controlled Controlled Controlled 1. Chronic pain syndrome   2. Cervical myofascial pain syndrome   3. Cervical spondylosis without myelopathy   4. Cervical fusion syndrome (C2-T1)   5. Hx of cervical spine surgery   6. Occipital headache   7. Degenerative disc disease, cervical      Updated Problems: Problem  Occipital Headache    Plan of Care  Pharmacotherapy (Medications Ordered): Meds ordered this encounter  Medications  . Oxycodone HCl 10 MG TABS    Sig: Take 1 tablet (10 mg total) by mouth every 12 (twelve) hours as needed. Must last 30 days.  Dispense:  60 tablet    Refill:  0    Chronic Pain. (STOP Act - Not applicable). Fill one day early if closed on [name] refill date.  . Oxycodone HCl 10 MG TABS    Sig: Take 1 tablet (10 mg total) by mouth every 12 (twelve) hours as needed. Must last 30 days.    Dispense:  60 tablet    Refill:  0    Chronic Pain. (STOP Act - Not applicable). Fill one day early if closed on [name] refill date.  . Oxycodone HCl 10 MG TABS    Sig: Take 1 tablet (10 mg total) by mouth every 12 (twelve) hours as needed. Must last 30 days.    Dispense:  60 tablet    Refill:  0    Chronic Pain. (STOP Act - Not applicable). Fill one day early if closed on [name] refill date.   Orders:  Orders Placed This  Encounter  Procedures  . ToxASSURE Select 13 (MW), Urine    Volume: 30 ml(s). Minimum 3 ml of urine is needed. Document temperature of fresh sample. Indications: Long term (current) use of opiate analgesic (W44.628)    Lab Orders     ToxASSURE Select 13 (MW), Urine  Planned follow-up:   Return in about 3 months (around 04/07/2020) for Medication Management, in person.     cervical TPI, cervical facet medial branch nerve block, suprascapular nerve block        Recent Visits Date Type Provider Dept  11/04/19 Office Visit Gillis Santa, MD [facility]  Showing recent visits within past 90 days and meeting all other requirements   Today's Visits Date Type Provider Dept  12/29/19 Office Visit Gillis Santa, MD [facility]  Showing today's visits and meeting all other requirements   Future Appointments No visits were found meeting these conditions.  Showing future appointments within next 90 days and meeting all other requirements   Primary Care Physician: Dion Body, MD Location: [facility] Note by: Gillis Santa, MD Date: 12/29/2019; Time: 11:49 AM  Note: This dictation was prepared with Dragon dictation. Any transcriptional errors that may result from this process are unintentional.

## 2019-12-29 NOTE — Progress Notes (Signed)
Nursing Pain Medication Assessment:  Safety precautions to be maintained throughout the outpatient stay will include: orient to surroundings, keep bed in low position, maintain call bell within reach at all times, provide assistance with transfer out of bed and ambulation.  Medication Inspection Compliance: Pill count conducted under aseptic conditions, in front of the patient. Neither the pills nor the bottle was removed from the patient's sight at any time. Once count was completed pills were immediately returned to the patient in their original bottle.  Medication: Oxycodone IR Pill/Patch Count: 36 of 60 pills remain Pill/Patch Appearance: Markings consistent with prescribed medication Bottle Appearance: Standard pharmacy container. Clearly labeled. Filled Date: 05 /18 / 2021 Last Medication intake:  Today

## 2019-12-31 DIAGNOSIS — G894 Chronic pain syndrome: Secondary | ICD-10-CM | POA: Diagnosis not present

## 2020-01-04 LAB — TOXASSURE SELECT 13 (MW), URINE

## 2020-01-12 DIAGNOSIS — R5381 Other malaise: Secondary | ICD-10-CM | POA: Diagnosis not present

## 2020-01-12 DIAGNOSIS — R52 Pain, unspecified: Secondary | ICD-10-CM | POA: Diagnosis not present

## 2020-01-27 DIAGNOSIS — N62 Hypertrophy of breast: Secondary | ICD-10-CM | POA: Diagnosis not present

## 2020-01-29 ENCOUNTER — Ambulatory Visit: Payer: Medicare Other | Admitting: Urology

## 2020-02-04 ENCOUNTER — Other Ambulatory Visit: Payer: Self-pay | Admitting: *Deleted

## 2020-02-04 DIAGNOSIS — N200 Calculus of kidney: Secondary | ICD-10-CM

## 2020-02-05 DIAGNOSIS — M542 Cervicalgia: Secondary | ICD-10-CM | POA: Diagnosis not present

## 2020-02-05 DIAGNOSIS — G44229 Chronic tension-type headache, not intractable: Secondary | ICD-10-CM | POA: Diagnosis not present

## 2020-02-05 DIAGNOSIS — I10 Essential (primary) hypertension: Secondary | ICD-10-CM | POA: Diagnosis not present

## 2020-02-05 DIAGNOSIS — N951 Menopausal and female climacteric states: Secondary | ICD-10-CM | POA: Diagnosis not present

## 2020-02-05 IMAGING — MG MM DIGITAL DIAGNOSTIC BILAT W/ TOMO W/ CAD
8 of 16 series · 8 of 32 positions shown · non-contrast
Comparison: 03/13/2016 and earlier

CLINICAL DATA: Two year follow-up for probably benign LEFT breast
calcifications.

EXAM:
DIGITAL DIAGNOSTIC BILATERAL MAMMOGRAM WITH CAD AND TOMO

[L CC]
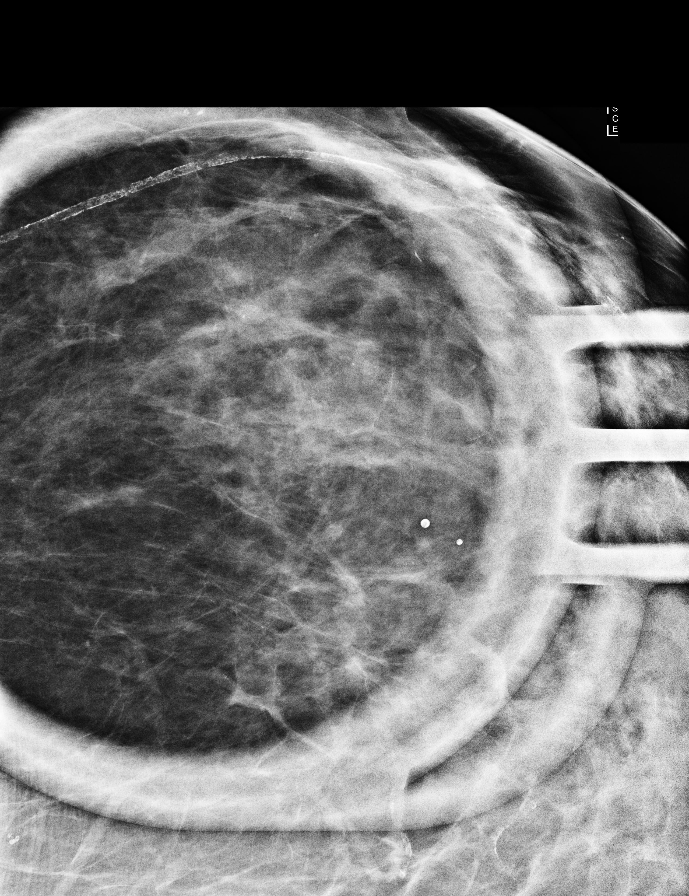

[L ML (1 of 2)]
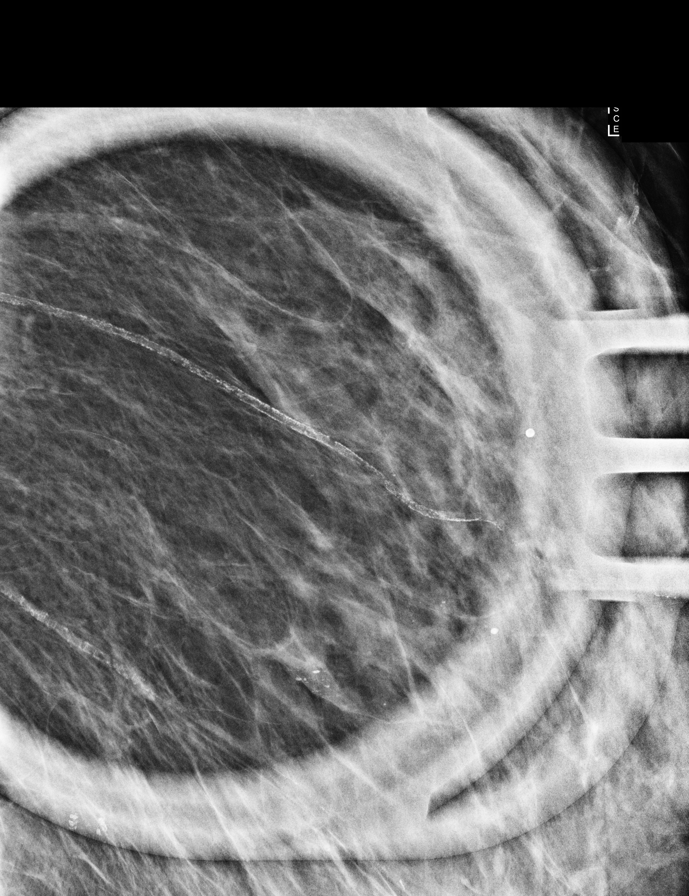

[R MLO]
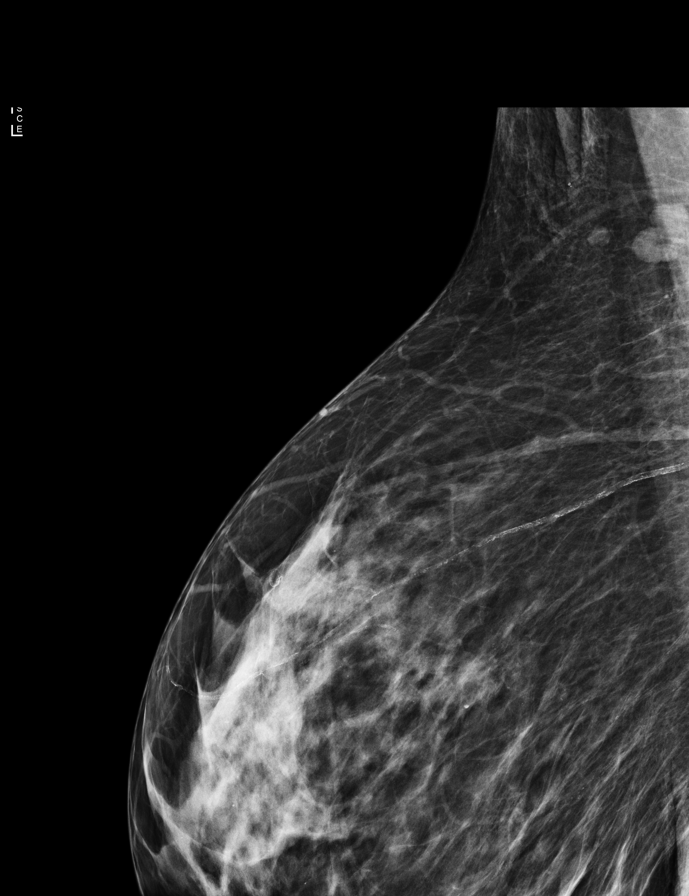

[L ML (2 of 2)]
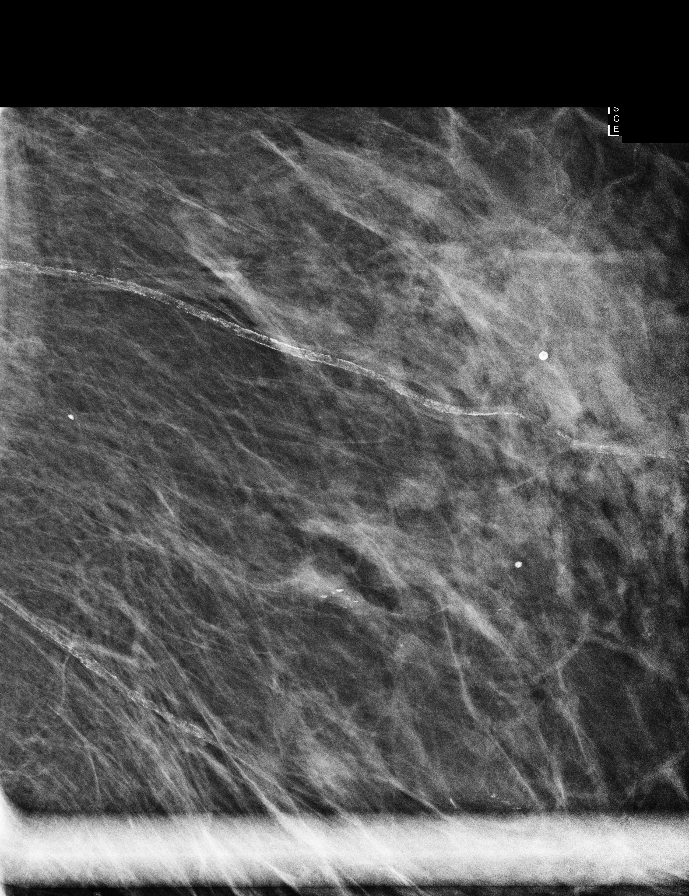

[L MLO]
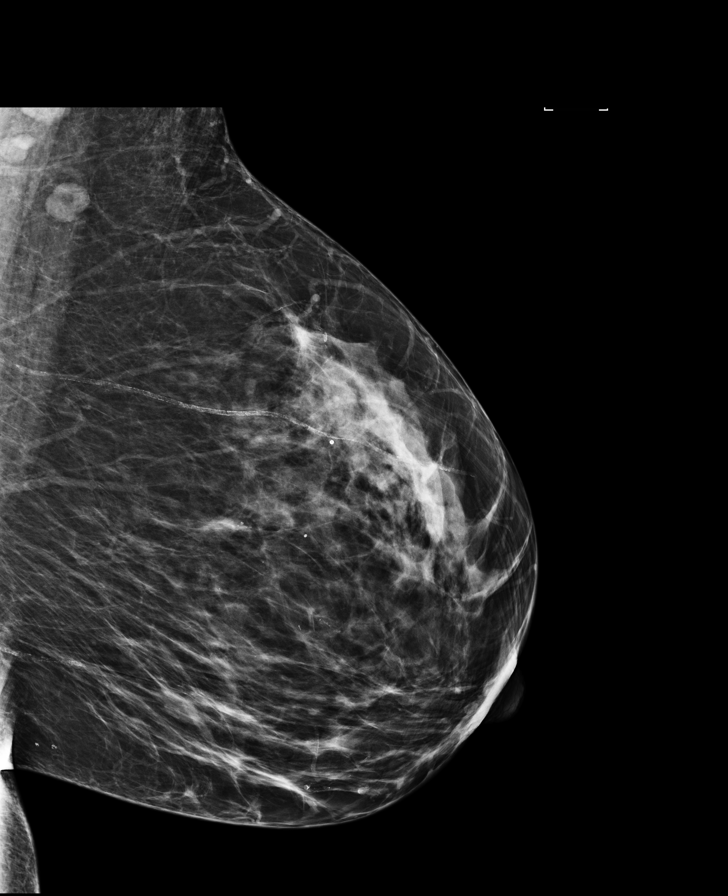

[L MLO synth-2D]
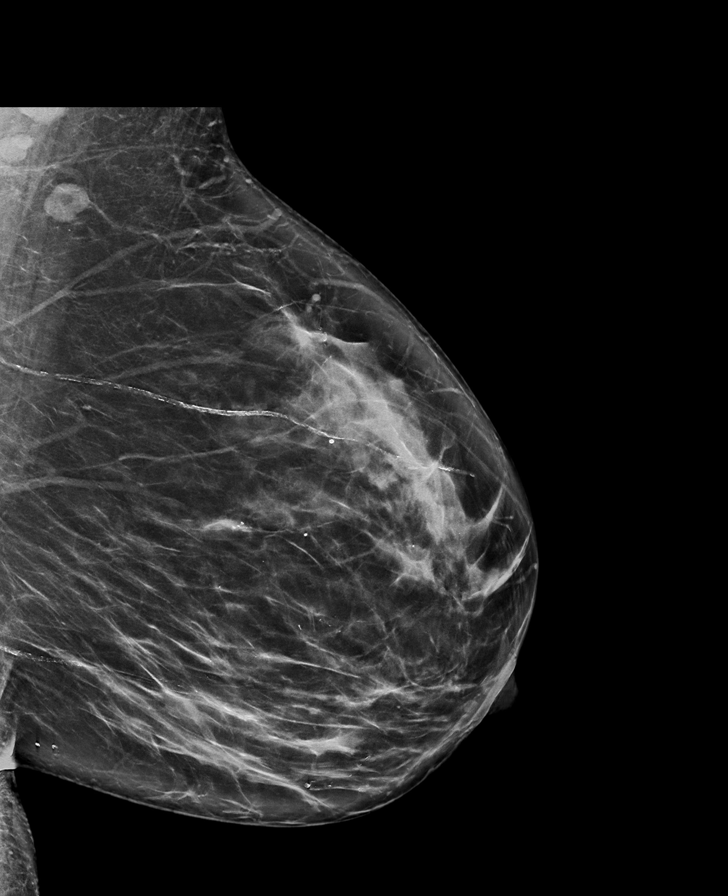

[R CC]
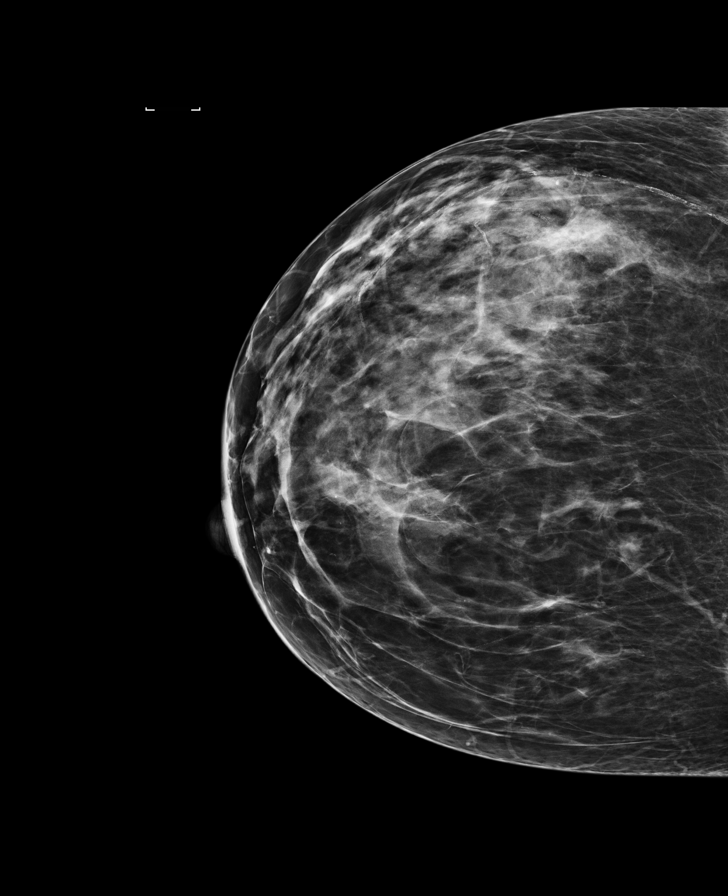

[L CC synth-2D]
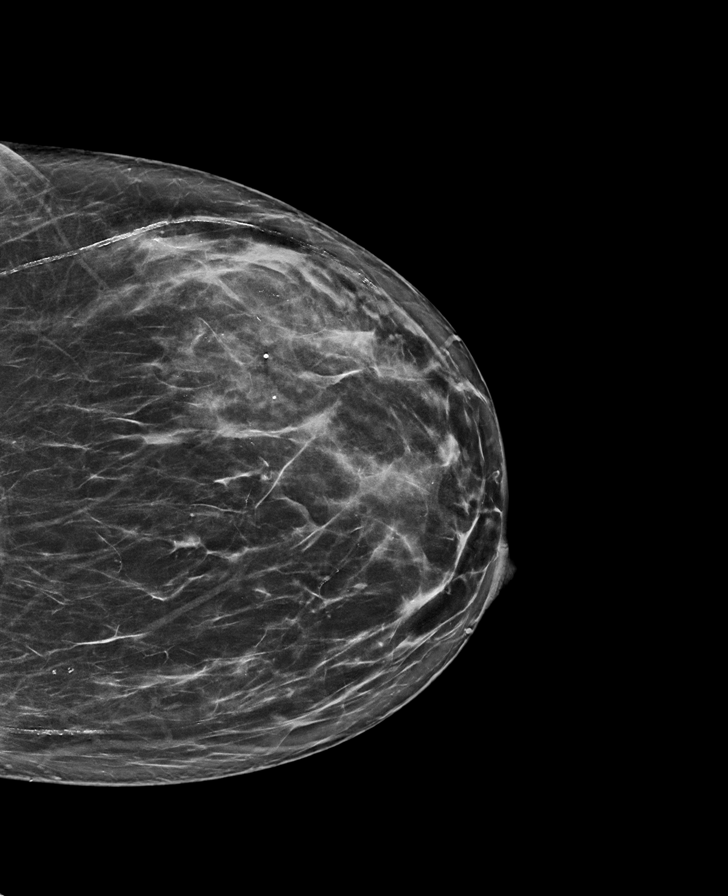

[8 of 32 positions shown; findings below may reference images not displayed]

ACR Breast Density Category c: The breast tissue is heterogeneously
dense, which may obscure small masses.
FINDINGS: No suspicious mass, distortion, or microcalcifications are
identified to suggest presence of malignancy in either breast.
Magnified views are performed of calcifications in the LATERAL
portion of the LEFT breast. These views demonstrate stable
calcifications, some with layering compatible with milk of calcium.
No suspicious morphology or distribution of calcifications
identified.

Mammographic images were processed with CAD.
IMPRESSION: Benign LEFT breast calcifications. No mammographic evidence for
malignancy.

RECOMMENDATION:
Screening mammogram in one year.(Code:[id number])

I have discussed the findings and recommendations with the patient.
Results were also provided in writing at the conclusion of the
visit. If applicable, a reminder letter will be sent to the patient
regarding the next appointment.

BI-RADS CATEGORY  2: Benign.

## 2020-02-06 NOTE — Progress Notes (Signed)
02/08/2020 10:27 AM   Kimberly [name] 1960/08/26 [id number]  Referring provider: Dion Body, MD [facility] [place],  [place] [address] Chief Complaint  Patient presents with   Nephrolithiasis    HPI: Kimberly [name] is a 59 y.o. female who returns for an annual visit.   -CTU from 08/04/2018 with no hydro, renal masses, or filling defects. 59mm left lower pole stone was treated with SWL on 09/04/18 with excellent fragmentation -Normal cysto on 01/28/2019 -Denies flank, abdominal, pelvic pain -No hematuria -Drinking plenty of fluids   PMH: Past Medical History:  Diagnosis Date   Anxiety    takes alprazolam - rare use    Arthritis    cervical spondylosis    Asthma    Balance problem    Brain cyst    Depression    Difficult intubation    Small mouth opening, limited neck flexion, very anterior    Dyspnea    Flu 08/10/2018   tested positive   GERD (gastroesophageal reflux disease)    pt. reports that its better, no meds in use at this time- 2015   Headache    Herpes    History of kidney stones    History of pneumonia    Hypertension    pt. doesn't see cardiologist, followed for HTN by Dr. Gerarda Fraction   Inflammation of shoulder joint    Memory difficulties    Neuromuscular disorder ([place])    joint and muscle problems   Pneumothorax on right    following C4-6 ACDF 07/04/16   PONV (postoperative nausea and vomiting)    WITH SURGERY 07/04/16 (NECK) RT LUNG COLLAPSED   Recurrent falls    Urinary frequency     Surgical History: Past Surgical History:  Procedure Laterality Date   ABDOMINAL HYSTERECTOMY     ANTERIOR CERVICAL DECOMP/DISCECTOMY FUSION N/A 08/06/2014   Procedure: ANTERIOR CERVICAL DECOMPRESSION/DISCECTOMY FUSION CERVICAL THREE-FOUR,CERVICAL SIX-SEVEN ,CERVICAL SEVEN-THORACIC ONE;  Surgeon: Floyce Stakes, MD;  Location: [facility];  Service: Neurosurgery;  Laterality: N/A;   ANTERIOR CERVICAL  DECOMP/DISCECTOMY FUSION N/A 07/04/2016   Procedure: CERVICAL FOUR-FIVE, CERVICAL FIVE-SIX ANTERIOR CERVICAL DECOMPRESSION/DISCECTOMY/FUSION WITH REVISION OF CERVICAL THREE-FOUR PLATE;  Surgeon: Leeroy Cha, MD;  Location: [facility];  Service: Neurosurgery;  Laterality: N/A;   CARPAL TUNNEL RELEASE Left 02/16/2013   Procedure: CARPAL TUNNEL RELEASE;  Surgeon: Carole Civil, MD;  Location: [facility];  Service: Orthopedics;  Laterality: Left;   CARPAL TUNNEL RELEASE Right 03/27/2013   Procedure: RIGHT CARPAL TUNNEL RELEASE;  Surgeon: Carole Civil, MD;  Location: [facility];  Service: Orthopedics;  Laterality: Right;   CESAREAN SECTION     x2   CHOLECYSTECTOMY N/A 06/17/2015   Procedure: LAPAROSCOPIC CHOLECYSTECTOMY;  Surgeon: Aviva Signs, MD;  Location: [facility];  Service: General;  Laterality: N/A;   COLONOSCOPY     COLONOSCOPY WITH PROPOFOL N/A 05/06/2019   Procedure: COLONOSCOPY WITH PROPOFOL;  Surgeon: Robert Bellow, MD;  Location: [facility];  Service: Endoscopy;  Laterality: N/A;   EXTRACORPOREAL SHOCK WAVE LITHOTRIPSY Left 09/04/2018   Procedure: EXTRACORPOREAL SHOCK WAVE LITHOTRIPSY (ESWL);  Surgeon: Billey Co, MD;  Location: [facility];  Service: Urology;  Laterality: Left;   FOREIGN BODY REMOVAL Left    knee-as child   KNEE ARTHROSCOPY     NASAL SINUS SURGERY N/A 04/13/2015   Procedure: nasal endoscopy with adenoid biopsy;  Surgeon: Ruby Cola, MD;  Location: [facility];  Service: ENT;  Laterality: N/A;   NECK SURGERY  2016   x 3 all together   POSTERIOR CERVICAL FUSION/FORAMINOTOMY N/A 11/13/2016   Procedure: CERVICAL TWO-CERVICAL SIX POSTERIOR CERVICAL FUSION WITH LATERAL MASS FIXATION;  Surgeon: Kristeen Miss, MD;  Location: [facility];  Service: Neurosurgery;  Laterality: N/A;  posterior approach   ROTATOR CUFF REPAIR Left    SHOULDER ACROMIOPLASTY Left 05/18/2015   Procedure: SHOULDER ACROMIOPLASTY;  Surgeon: Earlie Server, MD;  Location: [facility];  Service: Orthopedics;  Laterality: Left;   SHOULDER ARTHROSCOPY WITH DISTAL CLAVICLE RESECTION Left 05/18/2015   Procedure: LEFT SHOULDER ARTHROSCOPY WITH  DISTAL CLAVICLE RESECTION;  Surgeon: Earlie Server, MD;  Location: [facility];  Service: Orthopedics;  Laterality: Left;    Home Medications:  Allergies as of 02/08/2020      Reactions   Shellfish Allergy Anaphylaxis, Hives   "Swells throat up"   Peanut-containing Drug Products Hives   Codeine Other (See Comments)   jitters   Diphtheria Toxoid Rash   Tetanus Toxoids Rash      Medication List       Accurate as of February 08, 2020 10:27 AM. If you have any questions, ask your nurse or doctor.        acetaminophen 500 MG tablet Commonly known as: TYLENOL Take 500 mg by mouth 2 (two) times daily as needed for headache.   acyclovir 400 MG tablet Commonly known as: ZOVIRAX Take 400 mg by mouth 2 (two) times daily.   ALPRAZolam 0.5 MG tablet Commonly known as: XANAX Take 0.5 mg by mouth 3 (three) times daily as needed for anxiety.   amLODipine 5 MG tablet Commonly known as: NORVASC Take 5 mg by mouth daily.   amoxicillin 875 MG tablet Commonly known as: AMOXIL SMARTSIG:1 Tablet(s) By Mouth Every 12 Hours   clotrimazole 1 % cream Commonly known as: LOTRIMIN Apply 1 application topically daily as needed (itching).   cyclobenzaprine 10 MG tablet Commonly known as: FLEXERIL Take 1 tablet by mouth as needed for muscle spasms.   diazepam 10 MG tablet Commonly known as: VALIUM Take 10 mg by mouth daily as needed for anxiety.   diclofenac 75 MG EC tablet Commonly known as: VOLTAREN TAKE 1 TABLET BY MOUTH TWICE DAILY FOR ONE MONTH. THEN DECREASE TO ONE TABLET ONCE A DAY FOR ONE MONTH THEN DISCONTINUE   DULoxetine 30 MG capsule Commonly known as: Cymbalta Take 1 capsule (30 mg total) by mouth daily.   escitalopram 10 MG tablet Commonly known as: LEXAPRO Take 10 mg by mouth daily as needed  (anxiety).   estradiol 0.5 MG tablet Commonly known as: ESTRACE Take 0.5 mg by mouth daily.   gabapentin 100 MG capsule Commonly known as: NEURONTIN TWO CAPSULES BY MOUTH EVERY 8 HOURS   liothyronine 5 MCG tablet Commonly known as: CYTOMEL Take 1 tablet by mouth daily.   meclizine 12.5 MG tablet Commonly known as: ANTIVERT TAKE 1 TABLET BY MOUTH THREE TIMES DAILY AS NEEDED FOR DIZZINESS FOR UP TO 10 DAYS.   methocarbamol 500 MG tablet Commonly known as: ROBAXIN Take 500 mg by mouth every 6 (six) hours as needed.   nortriptyline 10 MG capsule Commonly known as: PAMELOR Take 1 capsule by mouth at bedtime as needed for sleep.   nystatin 100000 UNIT/ML suspension Commonly known as: MYCOSTATIN Take 100,000 Units by mouth daily. Swish and spit   oxybutynin 5 MG 24 hr tablet Commonly known as: DITROPAN-XL Take 5 mg by mouth 2 (two) times daily.   Oxycodone HCl 10 MG  Tabs Take 1 tablet (10 mg total) by mouth every 12 (twelve) hours as needed. Must last 30 days.   Oxycodone HCl 10 MG Tabs Take 1 tablet (10 mg total) by mouth every 12 (twelve) hours as needed. Must last 30 days. Start taking on: February 13, 2020   Oxycodone HCl 10 MG Tabs Take 1 tablet (10 mg total) by mouth every 12 (twelve) hours as needed. Must last 30 days. Start taking on: March 14, 2020   pantoprazole 40 MG tablet Commonly known as: PROTONIX Take 40 mg by mouth daily.   predniSONE 10 MG tablet Commonly known as: DELTASONE 40mg  x 2 days, 30mg  x 2days, 20mg  x 2days, 10mg  x 2days   pregabalin 50 MG capsule Commonly known as: LYRICA Take 50 mg by mouth 3 (three) times daily.   valACYclovir 500 MG tablet Commonly known as: VALTREX Take 500 mg by mouth 2 (two) times daily.   VISINE OP Apply 1 drop to eye daily as needed (irritation).   Vitamin D-1000 Max St 25 MCG (1000 UT) tablet Generic drug: Cholecalciferol Take 1,000 Units by mouth daily.       Allergies:  Allergies  Allergen Reactions    Shellfish Allergy Anaphylaxis and Hives    "Swells throat up"   Peanut-Containing Drug Products Hives   Codeine Other (See Comments)    jitters   Diphtheria Toxoid Rash   Tetanus Toxoids Rash    Family History: Family History  Problem Relation Age of Onset   Hypertension Father    Hypertension Other    Breast cancer Neg Hx     Social History:  reports that she has never smoked. She has never used smokeless tobacco. She reports that she does not drink alcohol and does not use drugs.   Physical Exam: BP 105/71    Pulse (!) 109    Ht 5\' 1"  (1.549 m)    Wt 145 lb (65.8 kg)    BMI 27.40 kg/m   Constitutional:  Alert and oriented, No acute distress. HEENT: [place] AT, moist mucus membranes.  Trachea midline, no masses. Cardiovascular: No clubbing, cyanosis, or edema. Respiratory: Normal respiratory effort, no increased work of breathing. Skin: No rashes, bruises or suspicious lesions. Neurologic: Grossly intact, no focal deficits, moving all 4 extremities. Psychiatric: Normal mood and affect.  Pertinent Imaging: KUB performed today was personally reviewed and are no calcifications seen suspicious for urinary tract stones.  There was a moderate amount of stool and bowel gas present.    Assessment & Plan:    1. History of microscopic hematuria -Cysto on 01/28/2019 was normal.   2.  History nephrolithiasis  -KUB today was normal.  -We discussed general stone prevention techniques including drinking plenty water with goal of producing 2.5 L urine daily, increased citric acid intake, avoidance of high oxalate containing foods, and decreased salt intake.  Information about dietary recommendations given today.   -Continue annual surveillance  - Follow up in 1 year with [facility] [address] [place], [place] [address] [phone]  I, Selena Batten, am acting as a scribe for Dr. Nicki Reaper C. Naeem Quillin,  I have reviewed  the above documentation for accuracy and completeness, and I agree with the above.   Abbie Sons, MD

## 2020-02-08 ENCOUNTER — Ambulatory Visit (INDEPENDENT_AMBULATORY_CARE_PROVIDER_SITE_OTHER): Payer: Medicare Other | Admitting: Urology

## 2020-02-08 ENCOUNTER — Encounter: Payer: Self-pay | Admitting: Urology

## 2020-02-08 ENCOUNTER — Ambulatory Visit
Admission: RE | Admit: 2020-02-08 | Discharge: 2020-02-08 | Disposition: A | Discharge status: Home / Self Care | Payer: Medicare Other | Attending: Urology | Admitting: Urology

## 2020-02-08 ENCOUNTER — Other Ambulatory Visit: Payer: Self-pay

## 2020-02-08 ENCOUNTER — Ambulatory Visit
Admission: RE | Admit: 2020-02-08 | Discharge: 2020-02-08 | Disposition: A | Discharge status: Home / Self Care | Payer: Medicare Other | Source: Ambulatory Visit | Attending: Urology | Admitting: Urology

## 2020-02-08 VITALS — BP 105/71 | HR 109 | Ht 61.0 in | Wt 145.0 lb

## 2020-02-08 DIAGNOSIS — N200 Calculus of kidney: Secondary | ICD-10-CM

## 2020-02-08 DIAGNOSIS — Z9049 Acquired absence of other specified parts of digestive tract: Secondary | ICD-10-CM | POA: Diagnosis not present

## 2020-02-08 DIAGNOSIS — Z87442 Personal history of urinary calculi: Secondary | ICD-10-CM | POA: Diagnosis not present

## 2020-02-08 DIAGNOSIS — R319 Hematuria, unspecified: Secondary | ICD-10-CM | POA: Diagnosis not present

## 2020-02-17 DIAGNOSIS — M2560 Stiffness of unspecified joint, not elsewhere classified: Secondary | ICD-10-CM | POA: Diagnosis not present

## 2020-02-17 DIAGNOSIS — M6281 Muscle weakness (generalized): Secondary | ICD-10-CM | POA: Diagnosis not present

## 2020-02-17 DIAGNOSIS — M542 Cervicalgia: Secondary | ICD-10-CM | POA: Diagnosis not present

## 2020-02-26 DIAGNOSIS — M542 Cervicalgia: Secondary | ICD-10-CM | POA: Diagnosis not present

## 2020-03-12 IMAGING — RF DG ESOPHAGUS
8 series · 14 of 20 positions shown · non-contrast
Comparison: None.

CLINICAL DATA: Dysphagia with liquids and solids

EXAM:
ESOPHOGRAM / BARIUM SWALLOW / BARIUM TABLET STUDY
TECHNIQUE: Combined double contrast and single contrast examination performed
thick liquid barium and thin barium liquid. The patient was observed
with fluoroscopy swallowing a 13 mm barium sulphate tablet.
FLUOROSCOPY TIME:  Fluoroscopy Time: 1 minutes 42 seconds
Radiation Exposure Index (if provided by the fluoroscopic device):
56.40 mGy
Number of Acquired Spot Images: 20

[Series 1: fluoro_barium 2fps_bw · 0.17mm/px · 2 of 5 frames shown (1 of 8)]
[frame 1/5]
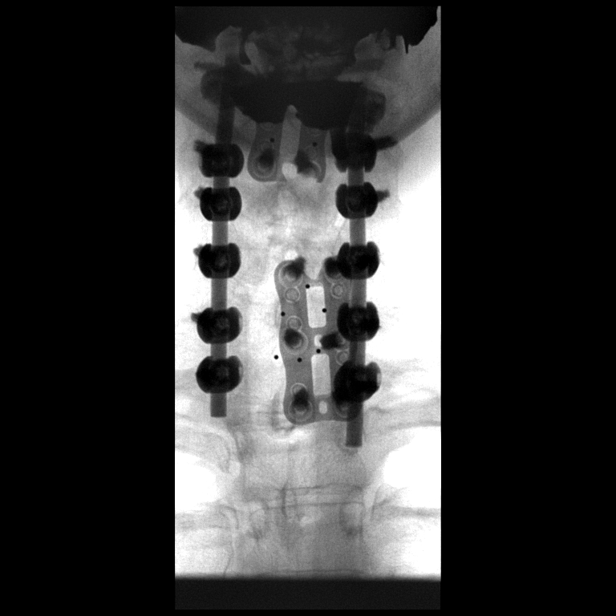
[frame 5/5]
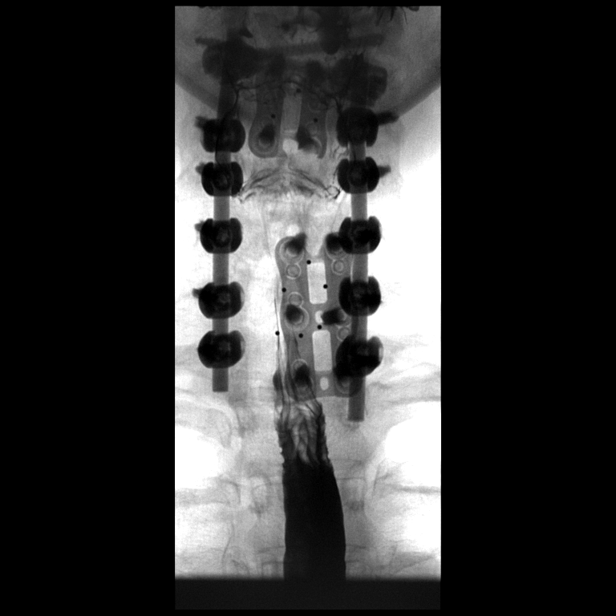

[Series 2: fluoro_barium 2fps_bw · 0.17mm/px · 2 of 4 frames shown (2 of 8)]
[frame 1/4]
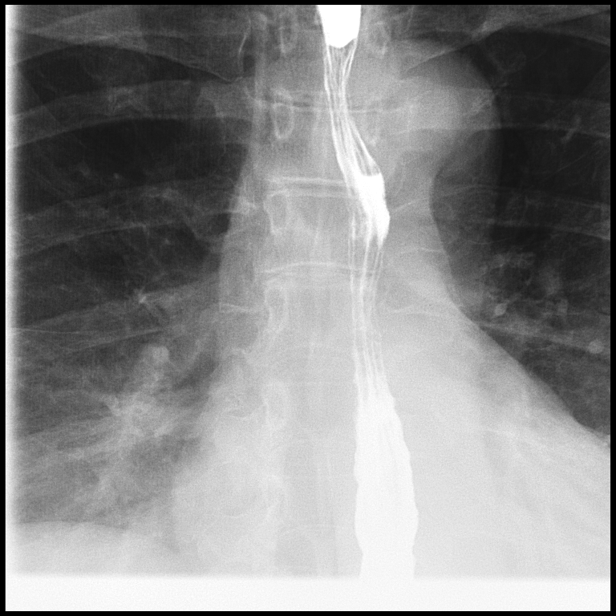
[frame 4/4]
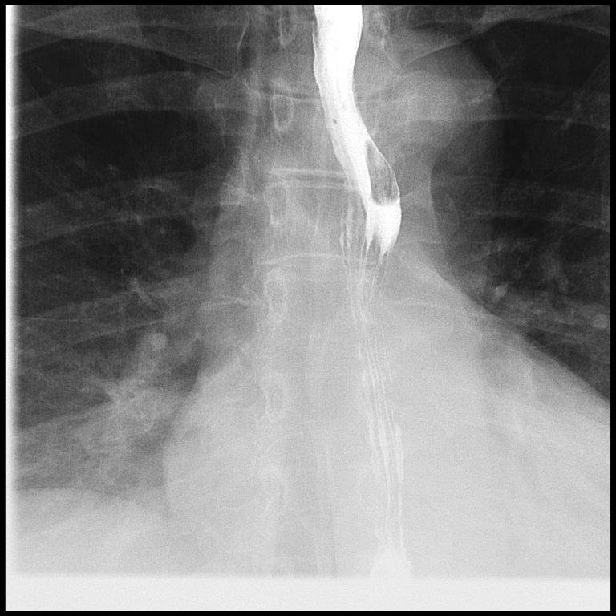

[Series 3: fluoro_barium 2fps_bw · 0.18mm/px · 2 of 14 frames shown (3 of 8)]
[frame 3/14]
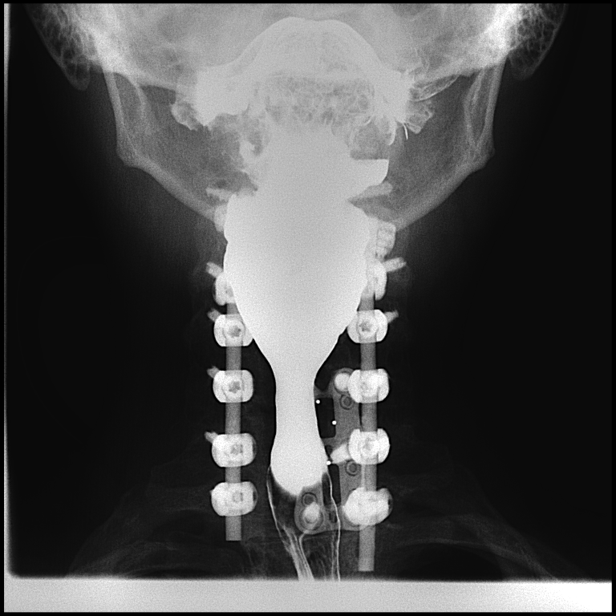
[frame 8/14]
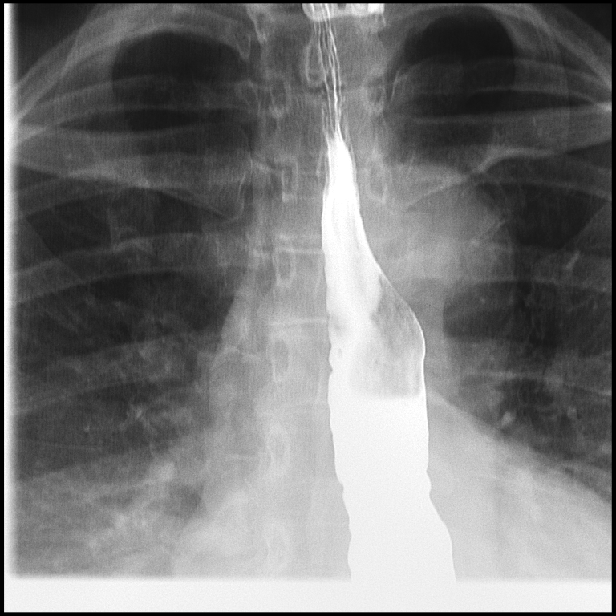

[Series 4: fluoro_barium 2fps_bw · 0.18mm/px · 1 of 1 slices shown (4 of 8)]
[im 1/1]
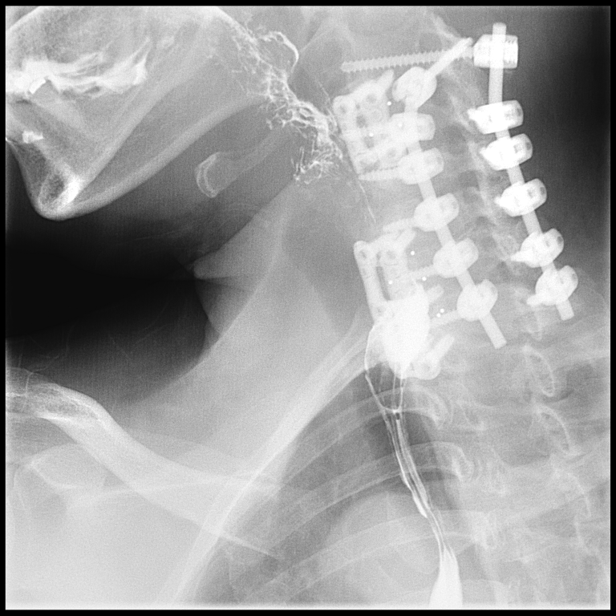

[Series 5: fluoro_barium 2fps_bw · 0.18mm/px · 2 of 3 frames shown (5 of 8)]
[frame 1/3]
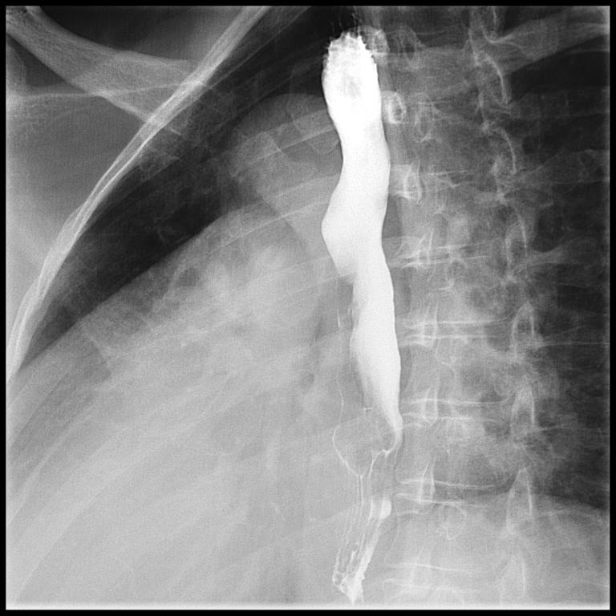
[frame 3/3]
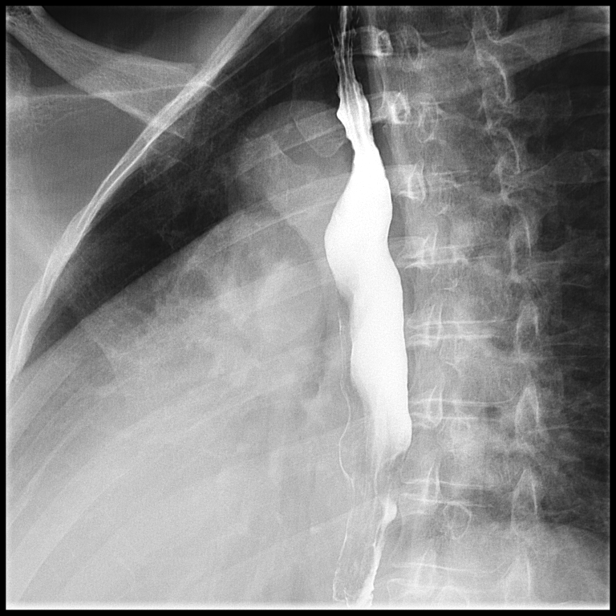

[Series 6: fluoro_barium 2fps_bw · 0.19mm/px · 3 of 10 frames shown (6 of 8)]
[frame 2/10]
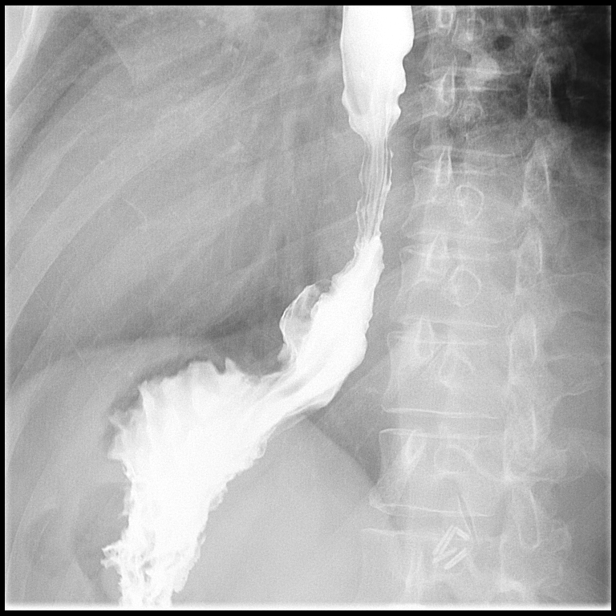
[frame 9/10]
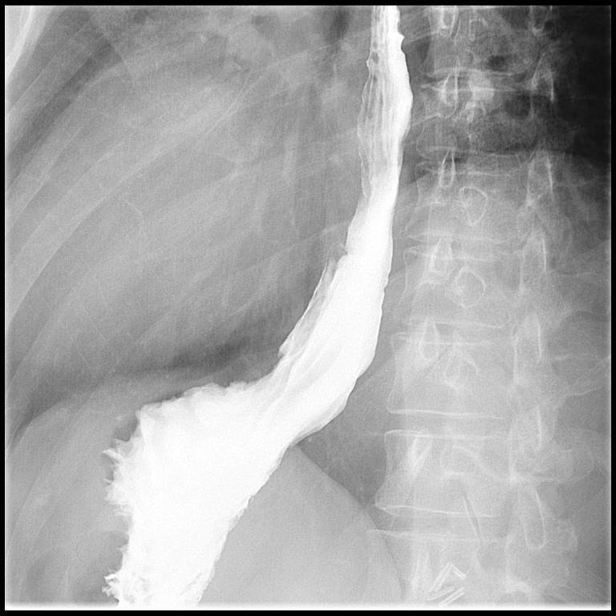
[frame 10/10]
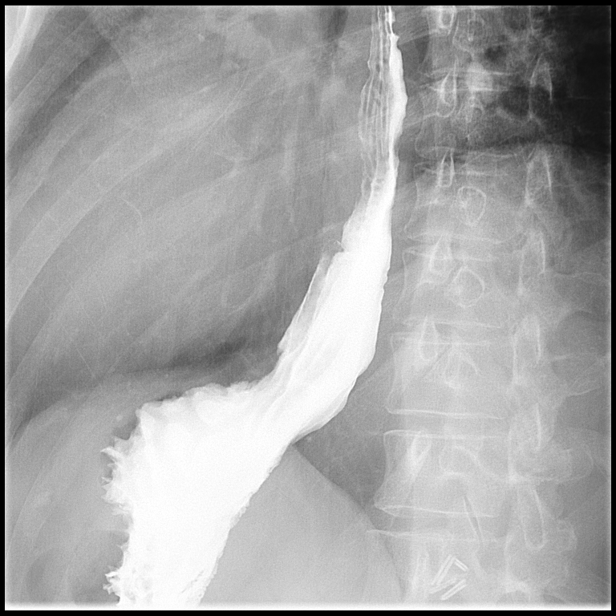

[Series 7: fluoro_barium 2fps_bw · 0.19mm/px · 1 of 1 slices shown (7 of 8)]
[im 1/1]
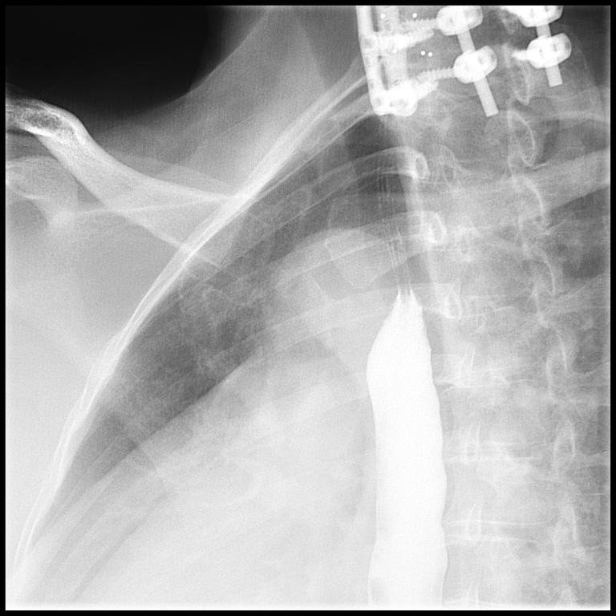

[Series 8: fluoro_barium 2fps_bw · 0.21mm/px · 1 of 2 frames shown (8 of 8)]
[frame 2/2]
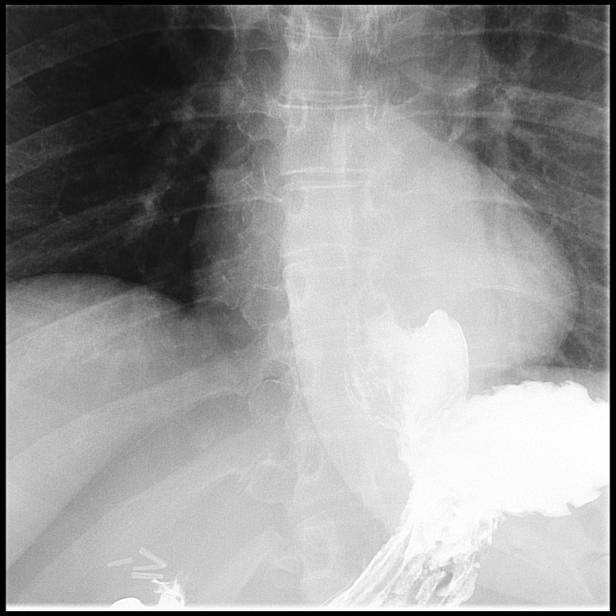

[14 of 20 positions shown; findings below may reference images not displayed]

FINDINGS: The patient swallows without appreciable difficulty. Peristalsis
with liquid material appears normal.

There is a small hiatal hernia with reflux of an incomplete column
of barium to the lower esophagus which empties slowly. There is mild
mucosal thickening within the hiatal hernia. Elsewhere of the mucosa
of the esophagus appears normal. No stricture, mass, or ulceration
evident. The pharynx appears unremarkable.

13 mm barium tablet passed freely through the esophagus into the
stomach. The tablet hesitated for approximately 30 seconds at the
hiatal hernia before passing intact into the stomach.
IMPRESSION: Small hiatal hernia with modest reflux. Mild mucosal inflammatory
change within the hiatal hernia. No mass or ulceration. No
stricture. Study otherwise unremarkable.

## 2020-03-16 DIAGNOSIS — M542 Cervicalgia: Secondary | ICD-10-CM | POA: Diagnosis not present

## 2020-03-22 DIAGNOSIS — M542 Cervicalgia: Secondary | ICD-10-CM | POA: Diagnosis not present

## 2020-03-31 DIAGNOSIS — K5900 Constipation, unspecified: Secondary | ICD-10-CM | POA: Diagnosis not present

## 2020-03-31 DIAGNOSIS — M5030 Other cervical disc degeneration, unspecified cervical region: Secondary | ICD-10-CM | POA: Diagnosis not present

## 2020-03-31 DIAGNOSIS — G44229 Chronic tension-type headache, not intractable: Secondary | ICD-10-CM | POA: Diagnosis not present

## 2020-04-01 DIAGNOSIS — R030 Elevated blood-pressure reading, without diagnosis of hypertension: Secondary | ICD-10-CM | POA: Diagnosis not present

## 2020-04-01 DIAGNOSIS — I10 Essential (primary) hypertension: Secondary | ICD-10-CM | POA: Diagnosis not present

## 2020-04-01 DIAGNOSIS — R519 Headache, unspecified: Secondary | ICD-10-CM | POA: Diagnosis not present

## 2020-04-05 ENCOUNTER — Encounter: Payer: Self-pay | Admitting: Student in an Organized Health Care Education/Training Program

## 2020-04-05 ENCOUNTER — Other Ambulatory Visit: Payer: Self-pay

## 2020-04-05 ENCOUNTER — Ambulatory Visit
Payer: Medicare Other | Attending: Student in an Organized Health Care Education/Training Program | Admitting: Student in an Organized Health Care Education/Training Program

## 2020-04-05 DIAGNOSIS — G894 Chronic pain syndrome: Secondary | ICD-10-CM | POA: Diagnosis not present

## 2020-04-05 DIAGNOSIS — Q761 Klippel-Feil syndrome: Secondary | ICD-10-CM | POA: Diagnosis not present

## 2020-04-05 DIAGNOSIS — M47812 Spondylosis without myelopathy or radiculopathy, cervical region: Secondary | ICD-10-CM | POA: Diagnosis not present

## 2020-04-05 MED ORDER — OXYCODONE HCL 10 MG PO TABS: 10.0000 mg | ORAL_TABLET | Freq: Two times a day (BID) | ORAL | 0 refills | Status: DC | PRN

## 2020-04-05 MED ORDER — GABAPENTIN 100 MG PO CAPS: ORAL_CAPSULE | ORAL | 2 refills | Status: DC

## 2020-04-05 NOTE — Progress Notes (Signed)
PROVIDER NOTE: Information contained herein reflects review and annotations entered in association with encounter. Interpretation of such information and data should be left to medically-trained personnel. Information provided to patient can be located elsewhere in the medical record under "Patient Instructions". Document created using STT-dictation technology, any transcriptional errors that may result from process are unintentional.    Patient: Kimberly [name]  Service Category: E/M  Provider: Gillis Santa, MD  DOB: 1960-08-16  DOS: 04/05/2020  Specialty: Interventional Pain Management  MRN: [id number]  Setting: Ambulatory outpatient  PCP: Dion Body, MD  Type: Established Patient    Referring Provider: Dion Body, MD  Location: Office  Delivery: Face-to-face     HPI  Reason for encounter: Ms. LAUREE YURICK, a 59 y.o. year old female, is here today for evaluation and management of her No primary diagnosis found.. Ms. Nudelman's primary complain today is Neck Pain Last encounter: Practice (12/29/2019). My last encounter with her was on 12/29/2019. Pertinent problems: Ms. Hebdon has SHOULDER PAIN; IMPINGEMENT SYNDROME; RUPTURE ROTATOR CUFF; CTS (carpal tunnel syndrome); Cervical spondylosis without myelopathy; Cervical stenosis of spinal canal; Pseudoarthrosis of cervical spine ([place]); Numbness and tingling; Vitamin D deficiency; Cervical fusion syndrome; Hx of cervical spine surgery; Chronic pain syndrome; Cervicalgia; Controlled substance agreement signed; Depression, major, single episode, complete remission ([place]); Cervical myofascial pain syndrome; and Occipital headache on their pertinent problem list. Pain Assessment: Severity of Chronic pain is reported as a 7 /10. Location: Neck Medial/up into the head. Onset: More than a month ago. Quality: Aching, Constant, Throbbing. Timing: Constant. Modifying factor(s): pain medicines. Vitals:  height is '5\' 1"'  (1.549 m) and weight is 143 lb (64.9  kg). Her temporal temperature is 97.1 F (36.2 C) (abnormal). Her blood pressure is 116/86 and her pulse is 89. Her respiration is 16 and oxygen saturation is 99%.   No change in medical history since last visit.  Patient usually has physical therapy with Andree Elk however due to him being unavailable, she had physical therapy with Florence Canner.  She states that her neck pain increased afterwards and she had to go into her primary care provider's office given severe neck pain and headaches for a prednisone taper.  Otherwise, patient continues multimodal pain regimen as prescribed with oxycodone, gabapentin, Flexeril.  States that it provides pain relief and improvement in functional status.   Pharmacotherapy Assessment   Analgesic: 03/15/2020  1   12/29/2019  Oxycodone Hcl 10 MG Tablet  60.00  30 Bi Lat   [id number]   [facility] (1123)   0/0  30.00 MME  Medicare   [place]    Monitoring: [place] PMP: PDMP reviewed during this encounter.       Pharmacotherapy: No side-effects or adverse reactions reported. Compliance: No problems identified. Effectiveness: Clinically acceptable.  No notes on file  UDS:  Summary  Date Value Ref Range Status  12/31/2019 Note  Final    Comment:    ==================================================================== [employer] [facility] (MW) ==================================================================== Test                             Result       Flag       Units Drug Present and Declared for Prescription Verification   Oxycodone                      1345         EXPECTED   ng/mg creat   Oxymorphone                    >  2786        EXPECTED   ng/mg creat   Noroxycodone                   2665         EXPECTED   ng/mg creat   Noroxymorphone                 1060         EXPECTED   ng/mg creat    Sources of oxycodone are scheduled prescription medications.    Oxymorphone, noroxycodone, and noroxymorphone are expected    metabolites of oxycodone. Oxymorphone is also  available as a    scheduled prescription medication. Drug Absent but Declared for Prescription Verification   Diazepam                       Not Detected UNEXPECTED ng/mg creat   Alprazolam                     Not Detected UNEXPECTED ng/mg creat ==================================================================== Test                      Result    Flag   Units      Ref Range   Creatinine              359              mg/dL      >=20 ==================================================================== Declared Medications:  The flagging and interpretation on this report are based on the  following declared medications.  Unexpected results may arise from  inaccuracies in the declared medications.  **Note: The testing scope of this panel includes these medications:  Alprazolam (Xanax)  Diazepam (Valium)  Oxycodone  **Note: The testing scope of this panel does not include the  following reported medications:  Acetaminophen (Tylenol)  Acyclovir (Zovirax)  Amlodipine (Norvasc)  Amoxicillin (Amoxil)  Cholecalciferol  Cyclobenzaprine (Flexeril)  Diclofenac (Voltaren)  Duloxetine (Cymbalta)  Escitalopram (Lexapro)  Estradiol (Estrace)  Eye Drop  Gabapentin (Neurontin)  Liothyronine (Cytomel)  Meclizine (Antivert)  Methocarbamol (Robaxin)  Nortriptyline (Pamelor)  Nystatin (Mycostatin)  Oxybutynin (Ditropan)  Pantoprazole (Protonix)  Prednisone (Deltasone)  Pregabalin (Lyrica)  Topical  Valacyclovir (Valtrex) ==================================================================== For clinical consultation, please call [phone]. ====================================================================      ROS  Constitutional: Denies any fever or chills Gastrointestinal: No reported hemesis, hematochezia, vomiting, or acute GI distress Musculoskeletal: bilateral neck pain Neurological: No reported episodes of acute onset apraxia, aphasia, dysarthria, agnosia, amnesia,  paralysis, loss of coordination, or loss of consciousness  Medication Review  ALPRAZolam, Cholecalciferol, Oxycodone HCl, Tetrahydrozoline HCl, acetaminophen, acyclovir, amLODipine, amoxicillin, clotrimazole, diazepam, escitalopram, estradiol, gabapentin, liothyronine, meclizine, methocarbamol, nortriptyline, nystatin, oxybutynin, pantoprazole, and valACYclovir  History Review  Allergy: Ms. Ferran is allergic to shellfish allergy, peanut-containing drug products, codeine, diphtheria toxoid, and tetanus toxoids. Drug: Ms. Krass  reports no history of drug use. Alcohol:  reports no history of alcohol use. Tobacco:  reports that she has never smoked. She has never used smokeless tobacco. Social: Ms. Fees  reports that she has never smoked. She has never used smokeless tobacco. She reports that she does not drink alcohol and does not use drugs. Medical:  has a past medical history of Anxiety, Arthritis, Asthma, Balance problem, Brain cyst, Depression, Difficult intubation, Dyspnea, Flu (08/10/2018), GERD (gastroesophageal reflux disease), Headache, Herpes, History of kidney stones, History of pneumonia, Hypertension, Inflammation of shoulder joint, Memory difficulties,  Neuromuscular disorder ([place]), Pneumothorax on right, PONV (postoperative nausea and vomiting), Recurrent falls, and Urinary frequency. Surgical: Ms. Weatherholtz  has a past surgical history that includes Abdominal hysterectomy; Rotator cuff repair (Left); Foreign Body Removal (Left); Carpal tunnel release (Left, 02/16/2013); Carpal tunnel release (Right, 03/27/2013); Cesarean section; Anterior cervical decomp/discectomy fusion (N/A, 08/06/2014); Colonoscopy; Nasal sinus surgery (N/A, 04/13/2015); Shoulder arthroscopy with distal clavicle resection (Left, 05/18/2015); Shoulder acromioplasty (Left, 05/18/2015); Cholecystectomy (N/A, 06/17/2015); Neck surgery (2016); Anterior cervical decomp/discectomy fusion (N/A, 07/04/2016); Posterior cervical  fusion/foraminotomy (N/A, 11/13/2016); Knee arthroscopy; Extracorporeal shock wave lithotripsy (Left, 09/04/2018); and Colonoscopy with propofol (N/A, 05/06/2019). Family: family history includes Hypertension in her father and another family member.  Laboratory Chemistry Profile   Renal Lab Results  Component Value Date   BUN 9 07/01/2019   CREATININE 0.86 07/01/2019   BCR 10 07/01/2019   GFRAA 86 07/01/2019   GFRNONAA 75 07/01/2019     Hepatic Lab Results  Component Value Date   AST 26 07/31/2015   ALT 17 07/31/2015   ALBUMIN 4.1 07/31/2015   ALKPHOS 93 07/31/2015   LIPASE 23 07/15/2017     Electrolytes Lab Results  Component Value Date   NA 144 07/01/2019   K 4.8 07/01/2019   CL 103 07/01/2019   CALCIUM 10.3 (H) 07/01/2019     Bone No results found for: VD25OH, VD125OH2TOT, JG2836OQ9, UT6546TK3, 25OHVITD1, 25OHVITD2, 25OHVITD3, TESTOFREE, TESTOSTERONE   Inflammation (CRP: Acute Phase) (ESR: Chronic Phase) No results found for: CRP, ESRSEDRATE, LATICACIDVEN     Note: Above Lab results reviewed.  Recent Imaging Review  Abdomen 1 view (KUB) CLINICAL DATA:  Kidney stone.  Hematuria  EXAM: ABDOMEN - 1 VIEW  COMPARISON:  03/20/2019.  CT 08/04/2018  FINDINGS: Previously seen stone in the lower pole of the left kidney not definitively seen by plain film. Rounded calcifications in the lower pelvis likely reflect phleboliths. Prior cholecystectomy. Nonobstructive bowel gas pattern with moderate stool burden.  IMPRESSION: No suspicious calcifications seen.  Moderate stool burden.  Electronically Signed   By: Rolm Baptise M.D.   On: 02/08/2020 12:15 Note: Reviewed        Physical Exam  General appearance: Well nourished, well developed, and well hydrated. In no apparent acute distress Mental status: Alert, oriented x 3 (person, place, & time)       Respiratory: No evidence of acute respiratory distress Eyes: PERLA Vitals: BP 116/86 (BP Location: Right Arm,  Patient Position: Sitting, Cuff Size: Normal)   Pulse 89   Temp (!) 97.1 F (36.2 C) (Temporal)   Resp 16   Ht '5\' 1"'  (1.549 m)   Wt 143 lb (64.9 kg)   SpO2 99%   BMI 27.02 kg/m  BMI: Estimated body mass index is 27.02 kg/m as calculated from the following:   Height as of this encounter: '5\' 1"'  (1.549 m).   Weight as of this encounter: 143 lb (64.9 kg). Ideal: Ideal body weight: 47.8 kg (105 lb 6.1 oz) Adjusted ideal body weight: 54.6 kg (120 lb 6.8 oz)  Cervical Spine Exam  Skin & Axial Inspection: Well healed scar from previous spine surgery detected Alignment: Symmetrical Functional ROM: Pain restricted ROM      Stability: No instability detected Muscle Tone/Strength: Functionally intact. No obvious neuro-muscular anomalies detected. Sensory (Neurological): Neurogenic pain pattern Palpation: No palpable anomalies                    Upper Extremity (UE) Exam    Side: Right upper extremity  Side: Left upper extremity   Skin & Extremity Inspection: Skin color, temperature, and hair growth are WNL. No peripheral edema or cyanosis. No masses, redness, swelling, asymmetry, or associated skin lesions. No contractures.  Skin & Extremity Inspection: Skin color, temperature, and hair growth are WNL. No peripheral edema or cyanosis. No masses, redness, swelling, asymmetry, or associated skin lesions. No contractures.   Functional ROM: Unrestricted ROM          Functional ROM: Unrestricted ROM           Muscle Tone/Strength: Functionally intact. No obvious neuro-muscular anomalies detected.  Muscle Tone/Strength: Functionally intact. No obvious neuro-muscular anomalies detected.   Sensory (Neurological): Unimpaired          Sensory (Neurological): Unimpaired           Palpation: No palpable anomalies              Palpation: No palpable anomalies               Provocative Test(s):  Phalen's test: deferred Tinel's test: deferred Apley's scratch test (touch opposite shoulder):   Action 1 (Across chest): deferred Action 2 (Overhead): deferred Action 3 (LB reach): deferred   Provocative Test(s):  Phalen's test: deferred Tinel's test: deferred Apley's scratch test (touch opposite shoulder):  Action 1 (Across chest): deferred Action 2 (Overhead): deferred Action 3 (LB reach): deferred     Thoracic Spine Area Exam  Skin & Axial Inspection: No masses, redness, or swelling Alignment: Symmetrical Functional ROM: Unrestricted ROM Stability: No instability detected Muscle Tone/Strength: Functionally intact. No obvious neuro-muscular anomalies detected. Sensory (Neurological): Unimpaired Muscle strength & Tone: No palpable anomalies  Lumbar Exam  Skin & Axial Inspection: No masses, redness, or swelling Alignment: Symmetrical Functional ROM: Unrestricted ROM       Stability: No instability detected Muscle Tone/Strength: Functionally intact. No obvious neuro-muscular anomalies detected. Sensory (Neurological): Unimpaired Palpation: No palpable anomalies       Provocative Tests: Hyperextension/rotation test: deferred today       Lumbar quadrant test (Kemp's test): deferred today       Lateral bending test: deferred today       Patrick's Maneuver: deferred today                   FABER* test: deferred today                   S-I anterior distraction/compression test: deferred today         S-I lateral compression test: deferred today         S-I Thigh-thrust test: deferred today         S-I Gaenslen's test: deferred today         *(Flexion, ABduction and External Rotation)  Gait & Posture Assessment  Ambulation: Unassisted Gait: Relatively normal for age and body habitus Posture: WNL   Lower Extremity Exam    Side: Right lower extremity  Side: Left lower extremity  Stability: No instability observed          Stability: No instability observed          Skin & Extremity Inspection: Skin color, temperature, and hair growth are WNL. No peripheral  edema or cyanosis. No masses, redness, swelling, asymmetry, or associated skin lesions. No contractures.  Skin & Extremity Inspection: Skin color, temperature, and hair growth are WNL. No peripheral edema or cyanosis. No masses, redness, swelling, asymmetry, or associated skin lesions. No contractures.  Functional ROM: Unrestricted ROM  Functional ROM: Unrestricted ROM                  Muscle Tone/Strength: Functionally intact. No obvious neuro-muscular anomalies detected.  Muscle Tone/Strength: Functionally intact. No obvious neuro-muscular anomalies detected.  Sensory (Neurological): Unimpaired        Sensory (Neurological): Unimpaired        DTR: Patellar: deferred today Achilles: deferred today Plantar: deferred today  DTR: Patellar: deferred today Achilles: deferred today Plantar: deferred today  Palpation: No palpable anomalies  Palpation: No palpable anomalies     Assessment   Status Diagnosis  Controlled Controlled Controlled 1. Chronic pain syndrome   2. Cervical spondylosis without myelopathy   3. Cervical fusion syndrome (C2-T1)      Updated Problems: Problem  Occipital Headache  Cervical Myofascial Pain Syndrome  Chronic Pain Syndrome  Cervicalgia  Controlled Substance Agreement Signed  Cervical Fusion Syndrome  Hx of Cervical Spine Surgery  Depression, Major, Single Episode, Complete Remission ([facility])  Pseudoarthrosis of Cervical Spine ([facility])  Numbness and Tingling  Vitamin D Deficiency  Cervical Stenosis of Spinal Canal  Cervical Spondylosis Without Myelopathy  Cts (Carpal Tunnel Syndrome)  SHOULDER PAIN   Qualifier: Diagnosis of  By: Aline Brochure MD, [place]     IMPINGEMENT SYNDROME   Qualifier: Diagnosis of  By: Aline Brochure MD, Dorothyann Peng     RUPTURE ROTATOR CUFF   Qualifier: Diagnosis of  By: Aline Brochure MD, Aldean Baker of Care   Ms. KIMIYA BRUNELLE has a current medication list which includes the following long-term  medication(s): escitalopram, gabapentin, liothyronine, nortriptyline, [START ON 04/14/2020] oxycodone hcl, [START ON 05/14/2020] oxycodone hcl, and [START ON 06/13/2020] oxycodone hcl.  Pharmacotherapy (Medications Ordered): Meds ordered this encounter  Medications  . Oxycodone HCl 10 MG TABS    Sig: Take 1 tablet (10 mg total) by mouth every 12 (twelve) hours as needed. Must last 30 days.    Dispense:  60 tablet    Refill:  0    Chronic Pain. (STOP Act - Not applicable). Fill one day early if closed on scheduled refill date.  . Oxycodone HCl 10 MG TABS    Sig: Take 1 tablet (10 mg total) by mouth every 12 (twelve) hours as needed. Must last 30 days.    Dispense:  60 tablet    Refill:  0    Chronic Pain. (STOP Act - Not applicable). Fill one day early if closed on scheduled refill date.  . Oxycodone HCl 10 MG TABS    Sig: Take 1 tablet (10 mg total) by mouth every 12 (twelve) hours as needed. Must last 30 days.    Dispense:  60 tablet    Refill:  0    Chronic Pain. (STOP Act - Not applicable). Fill one day early if closed on scheduled refill date.  . gabapentin (NEURONTIN) 100 MG capsule    Sig: TWO CAPSULES BY MOUTH EVERY 8 HOURS    Dispense:  180 capsule    Refill:  2   Follow-up plan:   Return in about 3 months (around 07/05/2020) for Medication Management, in person.     cervical TPI, cervical facet medial branch nerve block, suprascapular nerve block         Recent Visits No visits were found meeting these conditions. Showing recent visits within past 90 days and meeting all other requirements Today's Visits Date Type Provider Dept  04/05/20 Office Visit Gillis Santa, MD [facility]  Showing today's visits  and meeting all other requirements Future Appointments No visits were found meeting these conditions. Showing future appointments within next 90 days and meeting all other requirements  I discussed the assessment and treatment plan with the patient. The patient  was provided an opportunity to ask questions and all were answered. The patient agreed with the plan and demonstrated an understanding of the instructions.  Patient advised to call back or seek an in-person evaluation if the symptoms or condition worsens.  Duration of encounter: 30 minutes.  Note by: Gillis Santa, MD Date: 04/05/2020; Time: 11:41 AM

## 2020-04-06 DIAGNOSIS — R42 Dizziness and giddiness: Secondary | ICD-10-CM | POA: Diagnosis not present

## 2020-04-06 DIAGNOSIS — G479 Sleep disorder, unspecified: Secondary | ICD-10-CM | POA: Diagnosis not present

## 2020-04-06 DIAGNOSIS — R413 Other amnesia: Secondary | ICD-10-CM | POA: Diagnosis not present

## 2020-04-06 DIAGNOSIS — Z87898 Personal history of other specified conditions: Secondary | ICD-10-CM | POA: Diagnosis not present

## 2020-04-06 DIAGNOSIS — R519 Headache, unspecified: Secondary | ICD-10-CM | POA: Diagnosis not present

## 2020-04-12 DIAGNOSIS — R61 Generalized hyperhidrosis: Secondary | ICD-10-CM | POA: Diagnosis not present

## 2020-04-12 DIAGNOSIS — R6889 Other general symptoms and signs: Secondary | ICD-10-CM | POA: Diagnosis not present

## 2020-04-14 IMAGING — RF DG UGI W/ GASTROGRAFIN
5 series · 14 of 16 positions shown · IV contrast (agent unspecified)
Comparison: None.

CLINICAL DATA: Possible esophageal perforation. Upper endoscopy
with dilation 2 days ago.

EXAM:
WATER SOLUBLE UPPER GI SERIES
TECHNIQUE: Single-column upper GI series was performed using water soluble
contrast. Subsequently thin barium was administered.
CONTRAST:  Osovue-2JJ, approximately 60 cc.

[Series 9: fluoro_barium swallow 2fps_bb · 0.17mm/px · 3 of 13 frames shown (1 of 5)]
[frame 2/13]
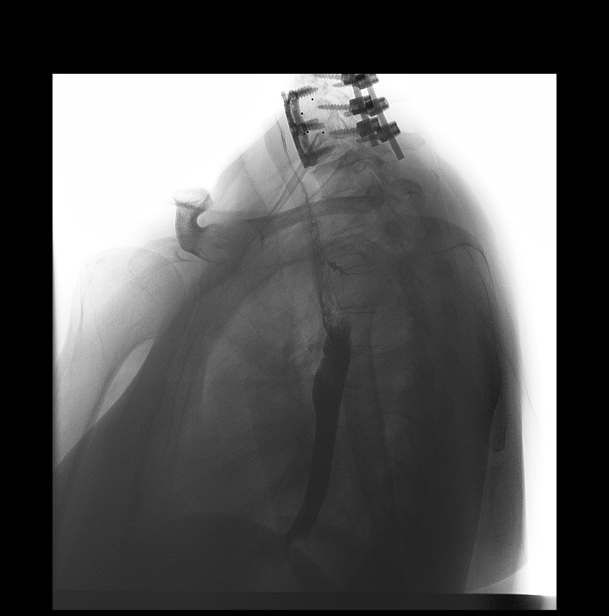
[frame 7/13]
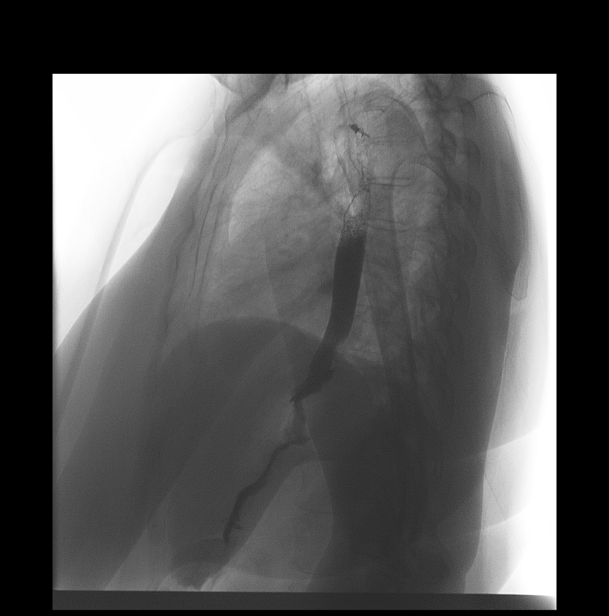
[frame 12/13]
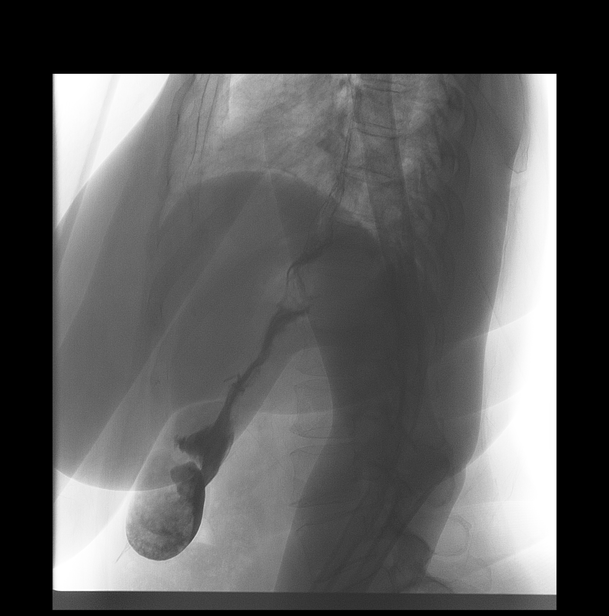

[Series 10: fluoro_barium swallow 2fps_bb · 0.17mm/px · 3 of 9 frames shown (2 of 5)]
[frame 5/9]
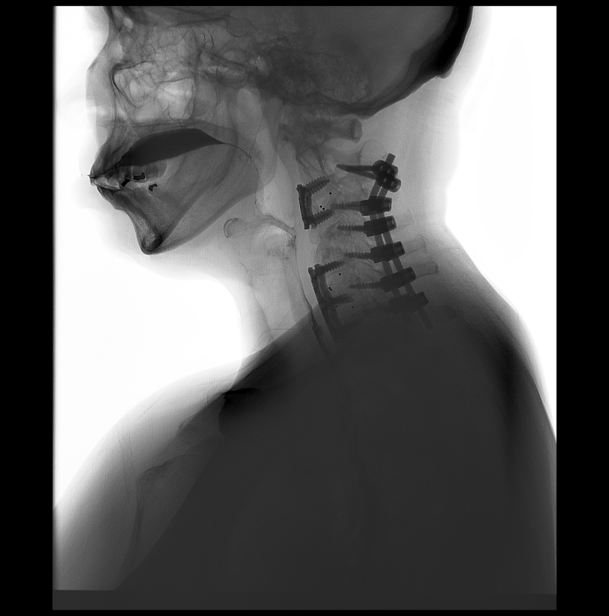
[frame 8/9]
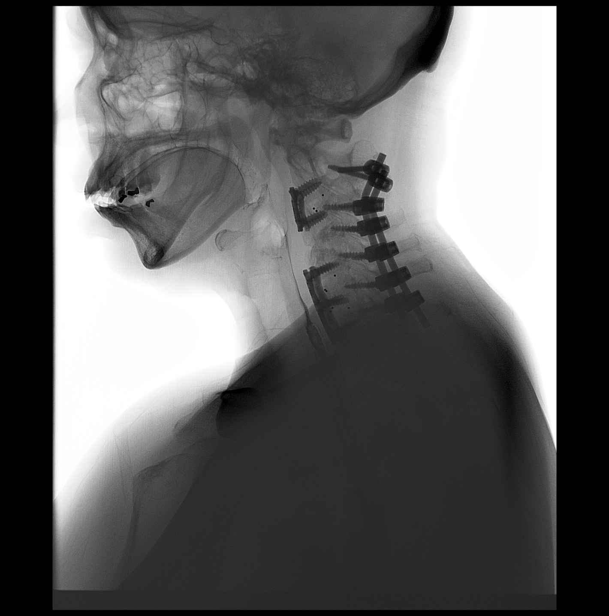
[frame 9/9]
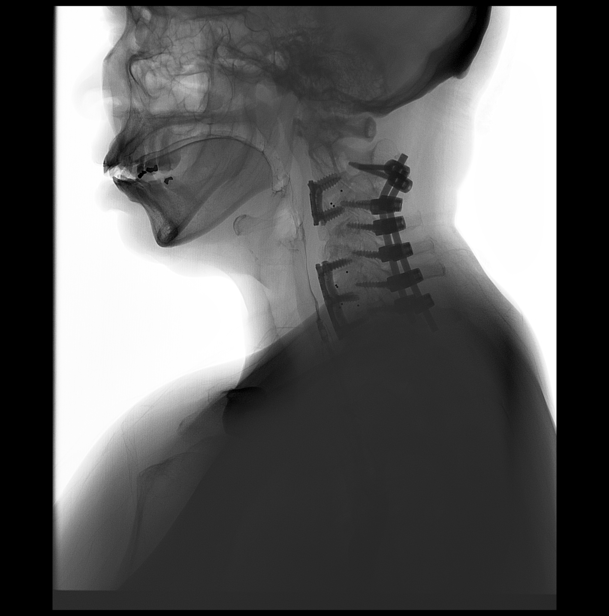

[Series 11: fluoro_barium swallow 2fps_bb · 0.17mm/px · 4 of 14 frames shown (3 of 5)]
[frame 3/14]
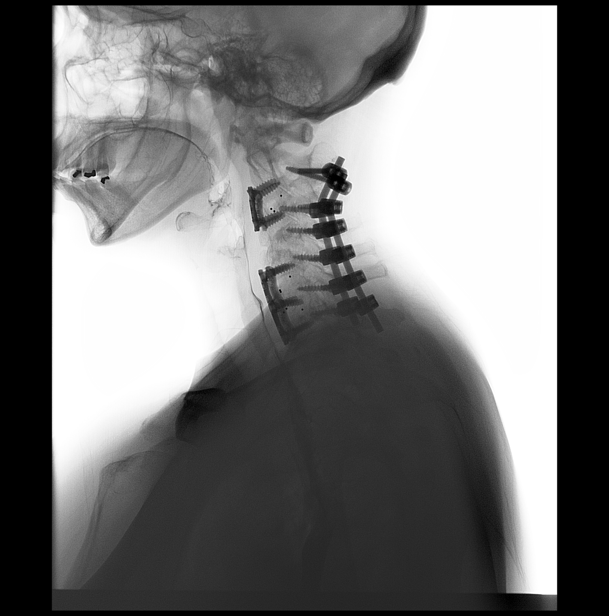
[frame 7/14]
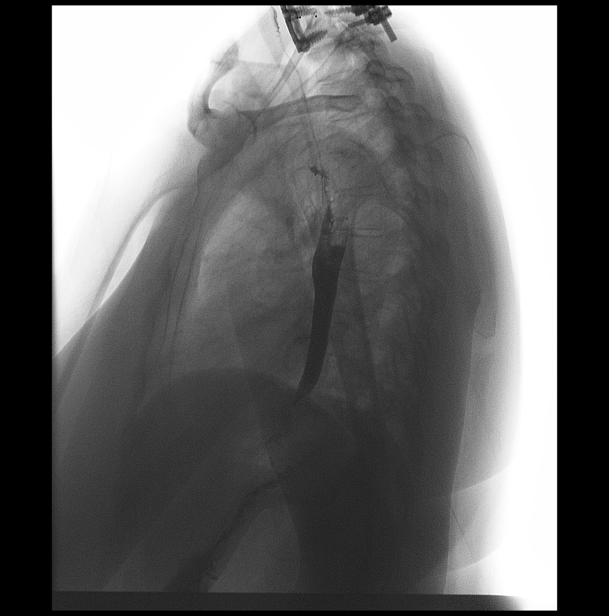
[frame 8/14]
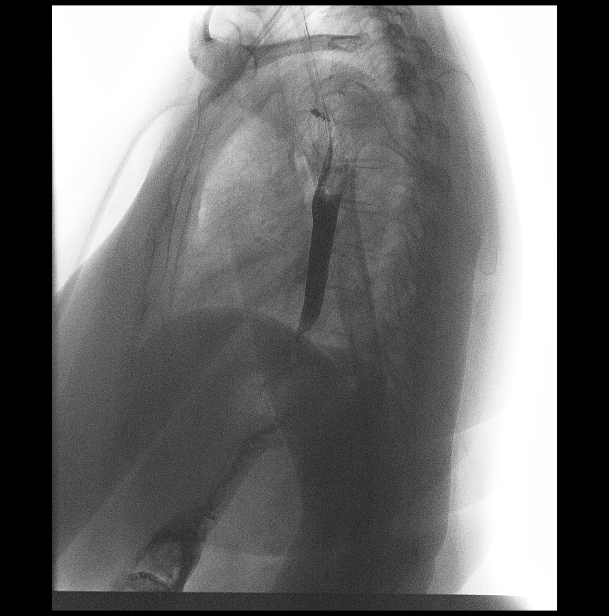
[frame 12/14]
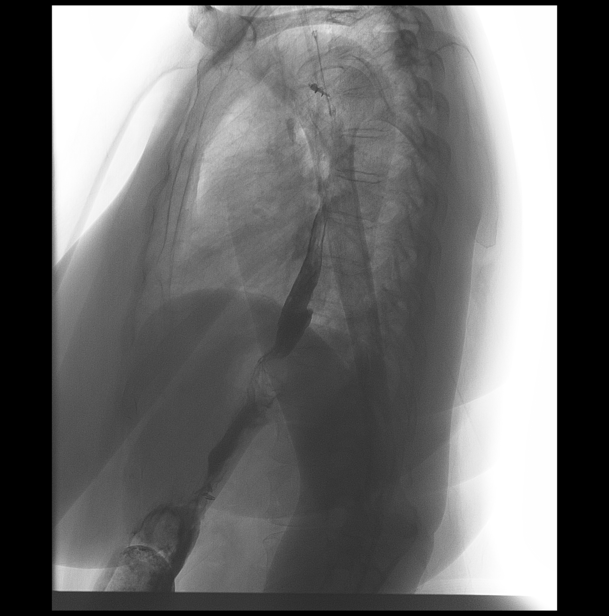

[Series 12: fluoro_barium swallow 2fps_bb · 0.17mm/px · 3 of 19 frames shown (4 of 5)]
[frame 10/19]
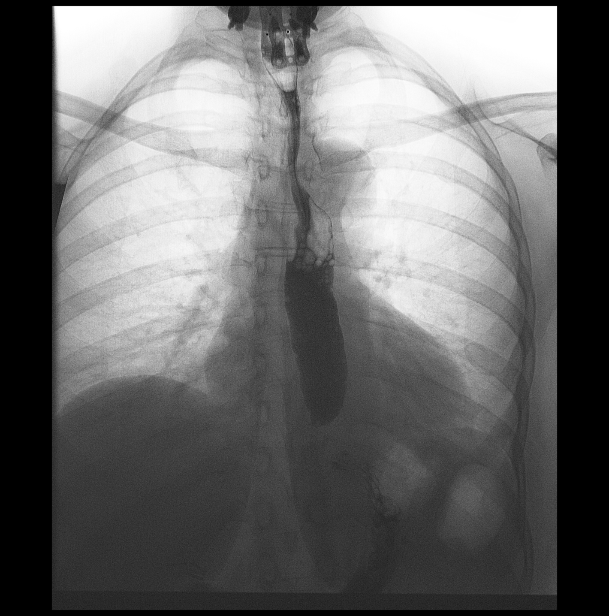
[frame 17/19]
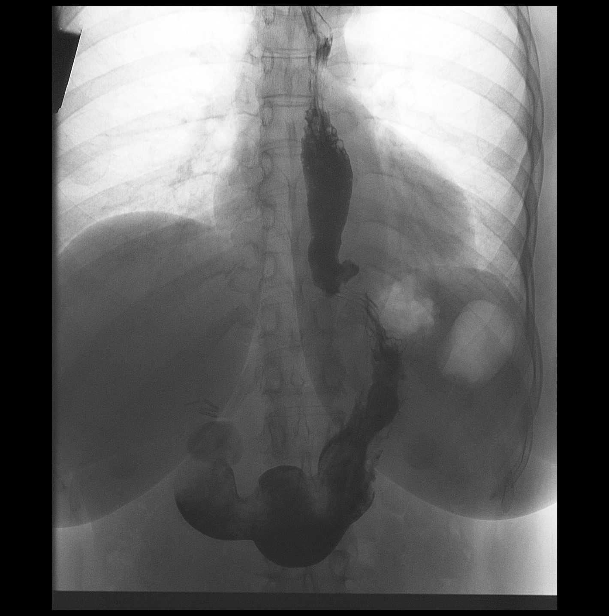
[frame 19/19]
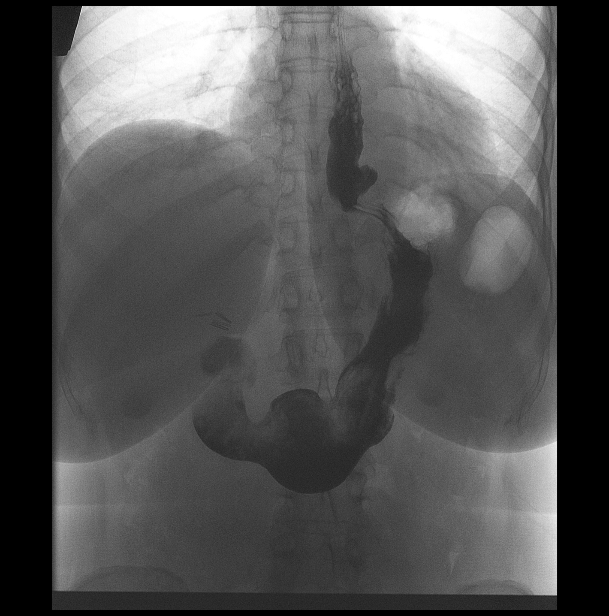

[Series 13: fluoro_barium swallow 2fps_bb · 0.17mm/px · 1 of 1 slices shown (5 of 5)]
[im 1/1]
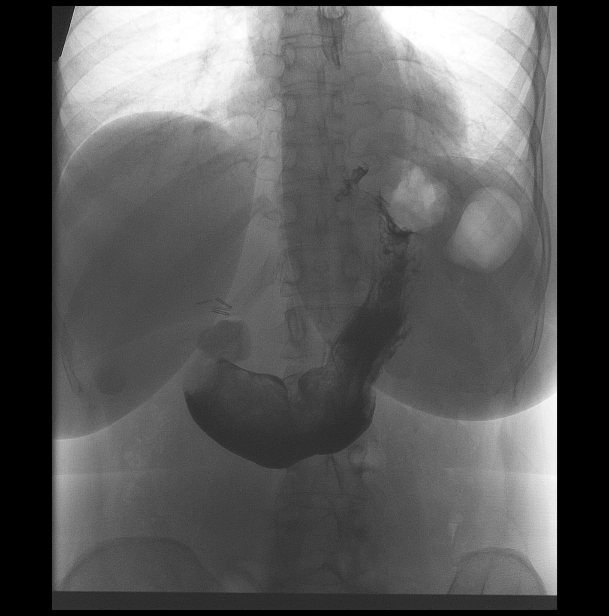

[14 of 16 positions shown; findings below may reference images not displayed]

FLUOROSCOPY TIME:  Fluoroscopy Time:  0 minutes, 28 seconds

Radiation Exposure Index (if provided by the fluoroscopic device):
56.80 mGy

Number of Acquired Spot Images: 1 fluoro spot image and 4 video
loops.
FINDINGS: The patient ingested the water-soluble contrast without difficulty.
The cervical esophagus was normal under fluoroscopic observation.
There was no laryngeal penetration of the barium. The thoracic
esophagus distended well. There was no extraluminal contrast. There
was no delay in passage of the contrast through the esophagus. There
was a tiny reducible hiatal hernia. Subsequently thin barium was
administered. No evidence of a leak was observed.
IMPRESSION: There is no evidence of esophageal leak or obstruction. The patient
tolerated the procedure well without significant discomfort.

## 2020-04-14 IMAGING — CR DG CHEST 1V
1 series · 1 of 1 positions shown · non-contrast
Comparison: Chest x-ray of September 15, 2016

CLINICAL DATA: Possible esophageal perforation. Upper endoscopy and
dilation 2 days ago. Sore throat.

EXAM:
CHEST  1 VIEW

[dg chest 1 view]
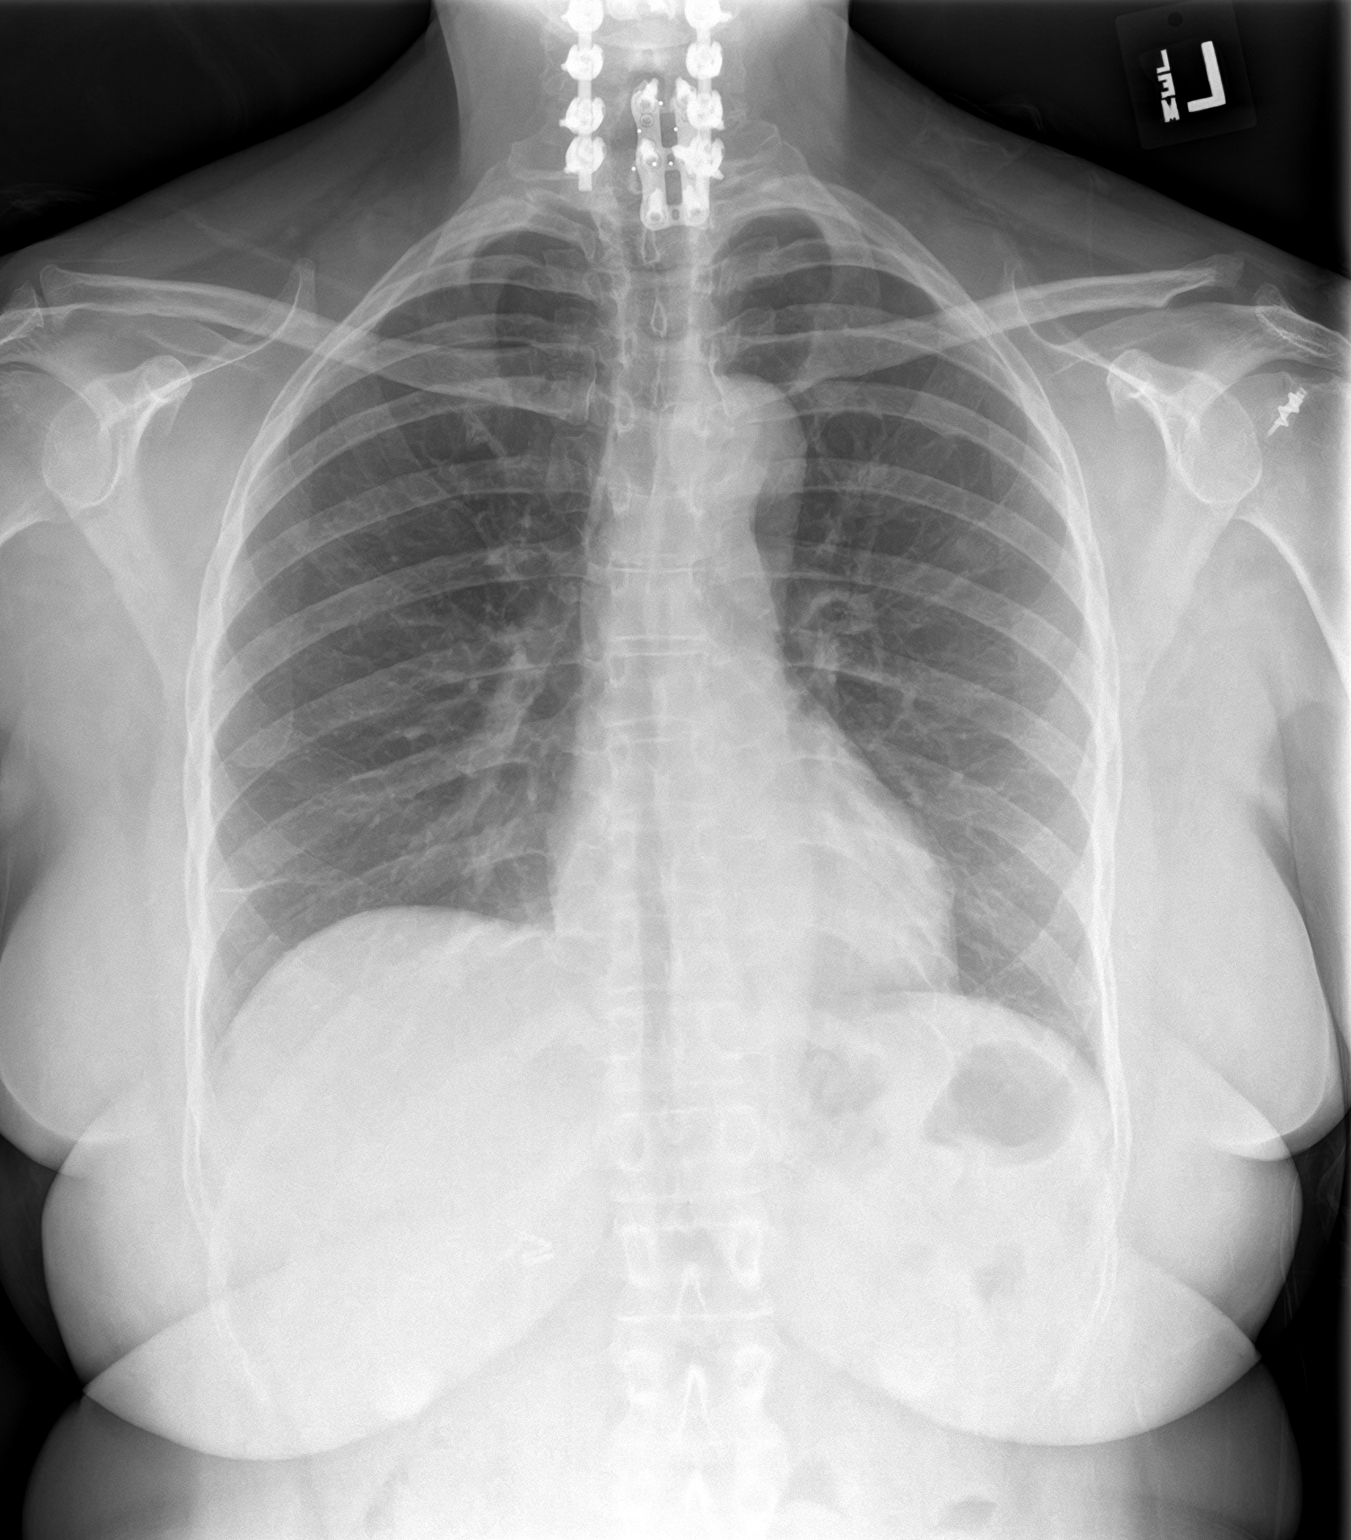

[1 of 1 positions shown; findings below may reference images not displayed]

FINDINGS: The lungs are adequately inflated. There is no focal infiltrate.
There is no pleural effusion. The heart and pulmonary vascularity
are normal. The mediastinum is normal in width. There is no pleural
effusion, pneumothorax, or pneumomediastinum. The bony thorax
exhibits no acute abnormality.
IMPRESSION: There is no active cardiopulmonary disease.

## 2020-04-20 DIAGNOSIS — M542 Cervicalgia: Secondary | ICD-10-CM | POA: Diagnosis not present

## 2020-05-04 DIAGNOSIS — M542 Cervicalgia: Secondary | ICD-10-CM | POA: Diagnosis not present

## 2020-05-05 DIAGNOSIS — R6889 Other general symptoms and signs: Secondary | ICD-10-CM | POA: Diagnosis not present

## 2020-05-05 DIAGNOSIS — E0590 Thyrotoxicosis, unspecified without thyrotoxic crisis or storm: Secondary | ICD-10-CM | POA: Diagnosis not present

## 2020-05-11 DIAGNOSIS — M542 Cervicalgia: Secondary | ICD-10-CM | POA: Diagnosis not present

## 2020-06-01 DIAGNOSIS — G479 Sleep disorder, unspecified: Secondary | ICD-10-CM | POA: Diagnosis not present

## 2020-06-01 DIAGNOSIS — R42 Dizziness and giddiness: Secondary | ICD-10-CM | POA: Diagnosis not present

## 2020-06-01 DIAGNOSIS — R519 Headache, unspecified: Secondary | ICD-10-CM | POA: Diagnosis not present

## 2020-06-01 DIAGNOSIS — Z136 Encounter for screening for cardiovascular disorders: Secondary | ICD-10-CM | POA: Diagnosis not present

## 2020-06-01 DIAGNOSIS — I10 Essential (primary) hypertension: Secondary | ICD-10-CM | POA: Diagnosis not present

## 2020-06-01 DIAGNOSIS — R413 Other amnesia: Secondary | ICD-10-CM | POA: Diagnosis not present

## 2020-06-01 DIAGNOSIS — M542 Cervicalgia: Secondary | ICD-10-CM | POA: Diagnosis not present

## 2020-06-02 ENCOUNTER — Other Ambulatory Visit: Payer: Self-pay | Admitting: Acute Care

## 2020-06-02 DIAGNOSIS — M542 Cervicalgia: Secondary | ICD-10-CM

## 2020-06-02 NOTE — Progress Notes (Signed)
Cancelled visit

## 2020-06-09 DIAGNOSIS — Z1389 Encounter for screening for other disorder: Secondary | ICD-10-CM | POA: Diagnosis not present

## 2020-06-09 DIAGNOSIS — Z0000 Encounter for general adult medical examination without abnormal findings: Secondary | ICD-10-CM | POA: Diagnosis not present

## 2020-06-15 ENCOUNTER — Other Ambulatory Visit: Payer: Self-pay

## 2020-06-15 ENCOUNTER — Ambulatory Visit
Admission: RE | Admit: 2020-06-15 | Discharge: 2020-06-15 | Disposition: A | Discharge status: Home / Self Care | Payer: Medicare Other | Source: Ambulatory Visit | Attending: Acute Care | Admitting: Acute Care

## 2020-06-15 DIAGNOSIS — M542 Cervicalgia: Secondary | ICD-10-CM | POA: Diagnosis not present

## 2020-06-16 ENCOUNTER — Ambulatory Visit
Payer: Medicare Other | Attending: Student in an Organized Health Care Education/Training Program | Admitting: Student in an Organized Health Care Education/Training Program

## 2020-06-16 ENCOUNTER — Encounter: Payer: Self-pay | Admitting: Student in an Organized Health Care Education/Training Program

## 2020-06-16 VITALS — BP 125/83 | HR 94 | Temp 97.5°F | Resp 16 | Ht 61.0 in | Wt 143.0 lb

## 2020-06-16 DIAGNOSIS — M7918 Myalgia, other site: Secondary | ICD-10-CM | POA: Insufficient documentation

## 2020-06-16 DIAGNOSIS — M47812 Spondylosis without myelopathy or radiculopathy, cervical region: Secondary | ICD-10-CM | POA: Diagnosis not present

## 2020-06-16 DIAGNOSIS — R519 Headache, unspecified: Secondary | ICD-10-CM | POA: Diagnosis not present

## 2020-06-16 DIAGNOSIS — Q761 Klippel-Feil syndrome: Secondary | ICD-10-CM | POA: Insufficient documentation

## 2020-06-16 DIAGNOSIS — G894 Chronic pain syndrome: Secondary | ICD-10-CM | POA: Insufficient documentation

## 2020-06-16 DIAGNOSIS — Z9889 Other specified postprocedural states: Secondary | ICD-10-CM | POA: Diagnosis not present

## 2020-06-16 MED ORDER — OXYCODONE HCL 10 MG PO TABS: 10.0000 mg | ORAL_TABLET | Freq: Two times a day (BID) | ORAL | 0 refills | Status: DC | PRN

## 2020-06-16 MED ORDER — PREGABALIN 50 MG PO CAPS: 50.0000 mg | ORAL_CAPSULE | Freq: Three times a day (TID) | ORAL | 0 refills | Status: DC

## 2020-06-16 NOTE — Progress Notes (Signed)
Nursing Pain Medication Assessment:  Safety precautions to be maintained throughout the outpatient stay will include: orient to surroundings, keep bed in low position, maintain call bell within reach at all times, provide assistance with transfer out of bed and ambulation.  Medication Inspection Compliance: Pill count conducted under aseptic conditions, in front of the patient. Neither the pills nor the bottle was removed from the patient's sight at any time. Once count was completed pills were immediately returned to the patient in their original bottle.  Medication: Oxycodone IR Pill/Patch Count: 5 of 60 pills remain Pill/Patch Appearance: Markings consistent with prescribed medication Bottle Appearance: Standard pharmacy container. Clearly labeled. Filled Date: 66 / 20 / 2021 Last Medication intake:  Yesterday

## 2020-06-16 NOTE — Progress Notes (Signed)
PROVIDER NOTE: Information contained herein reflects review and annotations entered in association with encounter. Interpretation of such information and data should be left to medically-trained personnel. Information provided to patient can be located elsewhere in the medical record under "Patient Instructions". Document created using STT-dictation technology, any transcriptional errors that may result from process are unintentional.    Patient: Kimberly [name]  Service Category: E/M  Provider: Gillis Santa, MD  DOB: 06/04/1961  DOS: 06/16/2020  Specialty: Interventional Pain Management  MRN: [id number]  Setting: Ambulatory outpatient  PCP: Dion Body, MD  Type: Established Patient    Referring Provider: Dion Body, MD  Location: Office  Delivery: Face-to-face     HPI  [name], a 59 y.o. year old female, is here today because of her Cervical spondylosis without myelopathy [M47.812]. [name] primary complain today is Headache Last encounter: My last encounter with her was on 04/05/2020. Pertinent problems: [name] has SHOULDER PAIN; IMPINGEMENT SYNDROME; RUPTURE ROTATOR CUFF; CTS (carpal tunnel syndrome); Cervical spondylosis without myelopathy; Cervical stenosis of spinal canal; Pseudoarthrosis of cervical spine ([place]); Numbness and tingling; Vitamin D deficiency; Cervical fusion syndrome; Hx of cervical spine surgery; Chronic pain syndrome; Cervicalgia; Controlled substance agreement signed; Depression, major, single episode, complete remission ([place]); Cervical myofascial pain syndrome; and Occipital headache on their pertinent problem list. Pain Assessment: Severity of Chronic pain is reported as a 7 /10. Location: Head  /radiates into neck. Onset: More than a month ago. Quality: Throbbing. Timing: Constant. Modifying factor(s): heat. Vitals:  height is '5\' 1"'  (1.549 m) and weight is 143 lb (64.9 kg). Her temperature is 97.5 F (36.4 C) (abnormal). Her blood pressure  is 125/83 and her pulse is 94. Her respiration is 16 and oxygen saturation is 100%.   Reason for encounter: medication management.   Patient follows up today for medication management and worsening neck pain and associated headaches.  Of note, patient has a C3-T1 fusion.  She has some associated facet degeneration and arthropathic changes at C4-C5, C6-C7, C7-T1.  She continues her medications as prescribed.  We discussed diagnostic cervical facet medial branch nerve blocks for her neck pain based upon clinical exam findings and MRI results.  Risks and benefits of this procedure were discussed and patient would like to proceed.  I will also refill her oxycodone as below.  Patient does not endorse significant pain relief with gabapentin so we will transition to Lyrica as below.  Continue other multimodal analgesics as prescribed.  Pharmacotherapy Assessment   05/18/2020  04/05/2020   1  Oxycodone Hcl 10 Mg Tablet 60.00  30  [facility]  [id number]  [facility] (1123)  0/0  30.00 MME  Medicare  [place]      Analgesic: Oxycodone 10 mg twice daily as needed, quantity 60/month; MME equals 30 Monitoring: [place] PMP: PDMP reviewed during this encounter.       Pharmacotherapy: No side-effects or adverse reactions reported. Compliance: No problems identified. Effectiveness: Clinically acceptable.  Dewayne Shorter, RN  06/16/2020 10:13 AM  Signed Nursing Pain Medication Assessment:  Safety precautions to be maintained throughout the outpatient stay will include: orient to surroundings, keep bed in low position, maintain call bell within reach at all times, provide assistance with transfer out of bed and ambulation.  Medication Inspection Compliance: Pill count conducted under aseptic conditions, in front of the patient. Neither the pills nor the bottle was removed from the patient's sight at any time. Once count was completed pills were immediately returned to the  patient in their original bottle.  Medication: Oxycodone  IR Pill/Patch Count: 5 of 60 pills remain Pill/Patch Appearance: Markings consistent with prescribed medication Bottle Appearance: Standard pharmacy container. Clearly labeled. Filled Date: 66 / 20 / 2021 Last Medication intake:  Yesterday    UDS:  Summary  Date Value Ref Range Status  12/31/2019 Note  Final    Comment:    ==================================================================== [employer] (MW) ==================================================================== Test                             Result       Flag       Units Drug Present and Declared for Prescription Verification   Oxycodone                      1345         EXPECTED   ng/mg creat   Oxymorphone                    >2786        EXPECTED   ng/mg creat   Noroxycodone                   2665         EXPECTED   ng/mg creat   Noroxymorphone                 1060         EXPECTED   ng/mg creat    Sources of oxycodone are scheduled prescription medications.    Oxymorphone, noroxycodone, and noroxymorphone are expected    metabolites of oxycodone. Oxymorphone is also available as a    scheduled prescription medication. Drug Absent but Declared for Prescription Verification   Diazepam                       Not Detected UNEXPECTED ng/mg creat   Alprazolam                     Not Detected UNEXPECTED ng/mg creat ==================================================================== Test                      Result    Flag   Units      Ref Range   Creatinine              359              mg/dL      >=20 ==================================================================== Declared Medications:  The flagging and interpretation on this report are based on the  following declared medications.  Unexpected results may arise from  inaccuracies in the declared medications.  **Note: The testing scope of this panel includes these medications:  Alprazolam (Xanax)  Diazepam (Valium)  Oxycodone  **Note: The testing scope  of this panel does not include the  following reported medications:  Acetaminophen (Tylenol)  Acyclovir (Zovirax)  Amlodipine (Norvasc)  Amoxicillin (Amoxil)  Cholecalciferol  Cyclobenzaprine (Flexeril)  Diclofenac (Voltaren)  Duloxetine (Cymbalta)  Escitalopram (Lexapro)  Estradiol (Estrace)  Eye Drop  Gabapentin (Neurontin)  Liothyronine (Cytomel)  Meclizine (Antivert)  Methocarbamol (Robaxin)  Nortriptyline (Pamelor)  Nystatin (Mycostatin)  Oxybutynin (Ditropan)  Pantoprazole (Protonix)  Prednisone (Deltasone)  Pregabalin (Lyrica)  Topical  Valacyclovir (Valtrex) ==================================================================== For clinical consultation, please call [phone]. ====================================================================      ROS  Constitutional: Denies any fever or chills Gastrointestinal: No  reported hemesis, hematochezia, vomiting, or acute GI distress Musculoskeletal: Neck pain, headache Neurological: No reported episodes of acute onset apraxia, aphasia, dysarthria, agnosia, amnesia, paralysis, loss of coordination, or loss of consciousness  Medication Review  ALPRAZolam, Cholecalciferol, DULoxetine, Oxycodone HCl, Tetrahydrozoline HCl, acetaminophen, acyclovir, amLODipine, diazepam, escitalopram, estradiol, liothyronine, meloxicam, methocarbamol, nortriptyline, nystatin, pantoprazole, predniSONE, pregabalin, rizatriptan, and valACYclovir  History Review  Allergy: [name] is allergic to shellfish allergy, peanut-containing drug products, codeine, diphtheria toxoid, and tetanus toxoids. Drug: [name]  reports no history of drug use. Alcohol:  reports no history of alcohol use. Tobacco:  reports that she has never smoked. She has never used smokeless tobacco. Social: [name]  reports that she has never smoked. She has never used smokeless tobacco. She reports that she does not drink alcohol and does not use drugs. Medical:   has a past medical history of Anxiety, Arthritis, Asthma, Balance problem, Brain cyst, Depression, Difficult intubation, Dyspnea, Flu (08/10/2018), GERD (gastroesophageal reflux disease), Headache, Herpes, History of kidney stones, History of pneumonia, Hypertension, Inflammation of shoulder joint, Memory difficulties, Neuromuscular disorder ([place]), Pneumothorax on right, PONV (postoperative nausea and vomiting), Recurrent falls, and Urinary frequency. Surgical: Ms. Carlin  has a past surgical history that includes Abdominal hysterectomy; Rotator cuff repair (Left); Foreign Body Removal (Left); Carpal tunnel release (Left, 02/16/2013); Carpal tunnel release (Right, 03/27/2013); Cesarean section; Anterior cervical decomp/discectomy fusion (N/A, 08/06/2014); Colonoscopy; Nasal sinus surgery (N/A, 04/13/2015); Shoulder arthroscopy with distal clavicle resection (Left, 05/18/2015); Shoulder acromioplasty (Left, 05/18/2015); Cholecystectomy (N/A, 06/17/2015); Neck surgery (2016); Anterior cervical decomp/discectomy fusion (N/A, 07/04/2016); Posterior cervical fusion/foraminotomy (N/A, 11/13/2016); Knee arthroscopy; Extracorporeal shock wave lithotripsy (Left, 09/04/2018); and Colonoscopy with propofol (N/A, 05/06/2019). Family: family history includes Hypertension in her father and another family member.  Laboratory Chemistry Profile   Renal Lab Results  Component Value Date   BUN 9 07/01/2019   CREATININE 0.86 07/01/2019   BCR 10 07/01/2019   GFRAA 86 07/01/2019   GFRNONAA 75 07/01/2019     Hepatic Lab Results  Component Value Date   AST 26 07/31/2015   ALT 17 07/31/2015   ALBUMIN 4.1 07/31/2015   ALKPHOS 93 07/31/2015   LIPASE 23 07/15/2017     Electrolytes Lab Results  Component Value Date   NA 144 07/01/2019   K 4.8 07/01/2019   CL 103 07/01/2019   CALCIUM 10.3 (H) 07/01/2019     Bone No results found for: VD25OH, VD125OH2TOT, NO0370WU8, QB1694HW3, 25OHVITD1, 25OHVITD2, 25OHVITD3, TESTOFREE,  TESTOSTERONE   Inflammation (CRP: Acute Phase) (ESR: Chronic Phase) No results found for: CRP, ESRSEDRATE, LATICACIDVEN     Note: Above Lab results reviewed.  Recent Imaging Review  MR CERVICAL SPINE WO CONTRAST CLINICAL DATA:  Neck pain.  EXAM: MRI CERVICAL SPINE WITHOUT CONTRAST  TECHNIQUE: Multiplanar, multisequence MR imaging of the cervical spine was performed. No intravenous contrast was administered.  COMPARISON:  MRI of the cervical spine May 07, 2016.  FINDINGS: Alignment: Straightening of the cervical curvature.  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 Status post ACDF at C3-4 and C6-7-T1. Posterior fixation from C3 through T1. Hardware results in susceptibility artifact limiting evaluation the adjacent structures. No appreciable fracture, evidence of discitis or bone lesion.  Cord: Normal signal and morphology.  Posterior Fossa, vertebral arteries, paraspinal tissues: Negative.  Disc levels:  C2-3: No spinal canal or neural foraminal stenose.  C3-4: No spinal canal or neural foraminal stenosis.  C4-5: No spinal canal stenosis. Uncovertebral and facet degenerative changes resulting moderate left neural foraminal narrowing.  C5-6:  No spinal canal or neural foraminal stenosis.  C6-7: No spinal canal stenosis. Uncovertebral and facet degenerative changes resulting in moderate bilateral neural foraminal.  C7-T1: No spinal canal stenosis. Uncovertebral and facet degenerative changes resulting in moderate bilateral neural foraminal narrowing.  No significant change in the degree of neural foraminal stenosis compared to prior MRI.  IMPRESSION: 1. 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 2. Status post ACDF at C3-4 and C6-7 and posterior fixation from C3 through T1. 3. No spinal canal stenosis. 4. Moderate neural foraminal narrowing on the left at C4-5, bilaterally at C6-7 and C7-T1.  Electronically Signed   By: Pedro Earls M.D.   On: 06/15/2020  12:29 Note: Reviewed Results made available to patient.  Physical Exam  General appearance: Well nourished, well developed, and well hydrated. In no apparent acute distress Mental status: Alert, oriented x 3 (person, place, & time)       Respiratory: No evidence of acute respiratory distress Eyes: PERLA Vitals: BP 125/83   Pulse 94   Temp (!) 97.5 F (36.4 C)   Resp 16   Ht '5\' 1"'  (1.549 m)   Wt 143 lb (64.9 kg)   SpO2 100%   BMI 27.02 kg/m  BMI: Estimated body mass index is 27.02 kg/m as calculated from the following:   Height as of this encounter: '5\' 1"'  (1.549 m).   Weight as of this encounter: 143 lb (64.9 kg). Ideal: Ideal body weight: 47.8 kg (105 lb 6.1 oz) Adjusted ideal body weight: 54.6 kg (120 lb 6.8 oz)  Cervical Spine Exam  Skin & Axial Inspection:Well healed scar from previous spine surgery detected Alignment:Symmetrical Functional STM:HDQQ restricted ROM Stability:No instability detected Muscle Tone/Strength:Functionally intact. No obvious neuro-muscular anomalies detected. Sensory (Neurological):Neurogenic pain pattern Palpation:No palpable anomalies        Upper Extremity (UE) Exam    Side:Right upper extremity  Side:Left upper extremity   Skin & Extremity Inspection:Skin color, temperature, and hair growth are WNL. No peripheral edema or cyanosis. No masses, redness, swelling, asymmetry, or associated skin lesions. No contractures.  Skin & Extremity Inspection:Skin color, temperature, and hair growth are WNL. No peripheral edema or cyanosis. No masses, redness, swelling, asymmetry, or associated skin lesions. No contractures.   Functional IWL:NLGXQJJHERDE ROM  Functional YCX:KGYJEHUDJSHF ROM   Muscle Tone/Strength:Functionally intact. No obvious neuro-muscular anomalies detected.  Muscle Tone/Strength:Functionally intact. No obvious neuro-muscular anomalies detected.   Sensory  (Neurological):Unimpaired  Sensory (Neurological):Unimpaired   Palpation:No palpable anomalies  Palpation:No palpable anomalies   Provocative Test(s): Phalen's test:deferred Tinel's test:deferred Apley's scratch test (touch opposite shoulder): Action 1 (Across chest):deferred Action 2 (Overhead):deferred Action 3 (LB reach):deferred   Provocative Test(s): Phalen's test:deferred Tinel's test:deferred Apley's scratch test (touch opposite shoulder): Action 1 (Across chest):deferred Action 2 (Overhead):deferred Action 3 (LB reach):deferred     Thoracic Spine Area Exam  Skin & Axial Inspection:No masses, redness, or swelling Alignment:Symmetrical Functional WYO:VZCHYIFOYDXA ROM Stability:No instability detected Muscle Tone/Strength:Functionally intact. No obvious neuro-muscular anomalies detected. Sensory (Neurological):Unimpaired Muscle strength & Tone:No palpable anomalies  Lumbar Exam  Skin & Axial Inspection:No masses, redness, or swelling Alignment:Symmetrical Functional JOI:NOMVEHMCNOBS ROM Stability:No instability detected Muscle Tone/Strength:Functionally intact. No obvious neuro-muscular anomalies detected. Sensory (Neurological):Unimpaired Palpation:No palpable anomalies Provocative Tests: Hyperextension/rotation test:deferred today Lumbar quadrant test (Kemp's test):deferred today Lateral bending test:deferred today Patrick's Maneuver:deferred today FABER* test:deferred today S-I anterior distraction/compression test:deferred today S-I lateral compression test:deferred today S-I Thigh-thrust test:deferred today S-I Gaenslen's test:deferred today *(Flexion, ABduction and External Rotation)  Gait & Posture Assessment  Ambulation:Unassisted  Gait:Relatively normal for age and  body habitus Posture:WNL  Lower Extremity Exam    Side:Right lower extremity  Side:Left lower extremity  Stability:No instability observed  Stability:No instability observed  Skin & Extremity Inspection:Skin color, temperature, and hair growth are WNL. No peripheral edema or cyanosis. No masses, redness, swelling, asymmetry, or associated skin lesions. No contractures.  Skin & Extremity Inspection:Skin color, temperature, and hair growth are WNL. No peripheral edema or cyanosis. No masses, redness, swelling, asymmetry, or associated skin lesions. No contractures.  Functional BJY:NWGNFAOZHYQM ROM   Functional VHQ:IONGEXBMWUXL ROM   Muscle Tone/Strength:Functionally intact. No obvious neuro-muscular anomalies detected.  Muscle Tone/Strength:Functionally intact. No obvious neuro-muscular anomalies detected.  Sensory (Neurological):Unimpaired  Sensory (Neurological):Unimpaired  DTR: Patellar:deferred today Achilles:deferred today Plantar:deferred today  DTR: Patellar:deferred today Achilles:deferred today Plantar:deferred today  Palpation:No palpable anomalies  Palpation:No palpable anomalies      Assessment   Status Diagnosis  Worsening Persistent Persistent 1. Cervical spondylosis without myelopathy   2. Cervical fusion syndrome (C2-T1)   3. Chronic pain syndrome   4. Hx of cervical spine surgery   5. Cervical myofascial pain syndrome   6. Occipital headache   7. Cervical facet joint syndrome       Plan of Care  [name] has a current medication list which includes the following long-term medication(s): escitalopram, liothyronine, nortriptyline, [START ON 07/17/2020] oxycodone hcl, [START ON 08/16/2020] oxycodone hcl, pregabalin, and rizatriptan.  Kimberly [name] has a history of greater than 3 months of moderate to severe pain which is resulted in functional impairment.   The patient has tried various conservative therapeutic options such as NSAIDs, Tylenol, muscle relaxants, physical therapy which was inadequately effective.  Patient's pain is predominantly axial with physical exam and cervical MRI findings suggestive of facet arthropathy.  Cervical facet medial branch nerve blocks were discussed with the patient.  Risks and benefits were reviewed.  Patient would like to proceed with bilateral C4, C5, C6 medial branch nerve block.  In regards to medication management, continue oxycodone as prescribed.  Stop gabapentin given lack of analgesic benefit.  Transition to Lyrica as below. Pharmacotherapy (Medications Ordered): Meds ordered this encounter  Medications  . Oxycodone HCl 10 MG TABS    Sig: Take 1 tablet (10 mg total) by mouth every 12 (twelve) hours as needed. Must last 30 days.    Dispense:  60 tablet    Refill:  0    Chronic Pain. (STOP Act - Not applicable). Fill one day early if closed on scheduled refill date.  . Oxycodone HCl 10 MG TABS    Sig: Take 1 tablet (10 mg total) by mouth every 12 (twelve) hours as needed. Must last 30 days.    Dispense:  60 tablet    Refill:  0    Chronic Pain. (STOP Act - Not applicable). Fill one day early if closed on scheduled refill date.  . pregabalin (LYRICA) 50 MG capsule    Sig: Take 1 capsule (50 mg total) by mouth 3 (three) times daily.    Dispense:  90 capsule    Refill:  0    Fill one day early if pharmacy is closed on scheduled refill date. May substitute for generic if available.   Orders:  Orders Placed This Encounter  Procedures  . CERVICAL FACET (MEDIAL BRANCH NERVE BLOCK)     Standing Status:   Future    Standing Expiration Date:   07/16/2020    Scheduling Instructions:     Side:  Bilateral     Level: C4-5, C5-6 Facet joints ( C4, C5, C6,  Medial Branch Nerves)     Sedation: with     Timeframe: As soon as schedule allows    Order Specific Question:   Where will this procedure be performed?     Answer:   [facility]   Follow-up plan:   Return in about 2 weeks (around 06/30/2020) for B/L C4, 5, 6 Fct block , with sedation.     cervical TPI, cervical facet medial branch nerve block, suprascapular nerve block          Recent Visits Date Type Provider Dept  04/05/20 Office Visit Gillis Santa, MD [facility]  Showing recent visits within past 90 days and meeting all other requirements Today's Visits Date Type Provider Dept  06/16/20 Office Visit Gillis Santa, MD [facility]  Showing today's visits and meeting all other requirements Future Appointments Date Type Provider Dept  07/04/20 Appointment Gillis Santa, MD [facility]  08/16/20 Appointment Gillis Santa, MD [facility]  Showing future appointments within next 90 days and meeting all other requirements  I discussed the assessment and treatment plan with the patient. The patient was provided an opportunity to ask questions and all were answered. The patient agreed with the plan and demonstrated an understanding of the instructions.  Patient advised to call back or seek an in-person evaluation if the symptoms or condition worsens.  Duration of encounter: 30 minutes.  Note by: Gillis Santa, MD Date: 06/16/2020; Time: 1:01 PM

## 2020-06-16 NOTE — Patient Instructions (Signed)

## 2020-06-20 ENCOUNTER — Telehealth: Payer: Self-pay | Admitting: Student in an Organized Health Care Education/Training Program

## 2020-06-20 NOTE — Telephone Encounter (Signed)
AttEmpted to call patient to discuss her phone message.  No answer, no answering machine.

## 2020-06-20 NOTE — Telephone Encounter (Signed)
Patient states she started taking Lyrica and it caused her to have increased pain in the back of her head. Please call, she has not taken it this morning.

## 2020-07-04 ENCOUNTER — Ambulatory Visit
Admission: RE | Admit: 2020-07-04 | Discharge: 2020-07-04 | Disposition: A | Discharge status: Home / Self Care | Payer: Medicare Other | Source: Ambulatory Visit | Attending: Student in an Organized Health Care Education/Training Program | Admitting: Student in an Organized Health Care Education/Training Program

## 2020-07-04 ENCOUNTER — Encounter: Payer: Self-pay | Admitting: Student in an Organized Health Care Education/Training Program

## 2020-07-04 ENCOUNTER — Ambulatory Visit (HOSPITAL_BASED_OUTPATIENT_CLINIC_OR_DEPARTMENT_OTHER): Payer: Medicare Other | Admitting: Student in an Organized Health Care Education/Training Program

## 2020-07-04 ENCOUNTER — Other Ambulatory Visit: Payer: Self-pay

## 2020-07-04 VITALS — BP 128/86 | HR 95 | Temp 97.3°F | Resp 13 | Ht 61.0 in | Wt 145.0 lb

## 2020-07-04 DIAGNOSIS — M47812 Spondylosis without myelopathy or radiculopathy, cervical region: Secondary | ICD-10-CM | POA: Diagnosis not present

## 2020-07-04 DIAGNOSIS — G894 Chronic pain syndrome: Secondary | ICD-10-CM

## 2020-07-04 MED ORDER — ROPIVACAINE HCL 2 MG/ML IJ SOLN
9.0000 mL | Freq: Once | INTRAMUSCULAR | Status: AC
  Administered 2020-07-04: 9 mL via PERINEURAL

## 2020-07-04 MED ORDER — FENTANYL CITRATE (PF) 100 MCG/2ML IJ SOLN
25.0000 ug | INTRAMUSCULAR | Status: AC | PRN
  Administered 2020-07-04 (×2): 50 ug via INTRAVENOUS

## 2020-07-04 MED ORDER — ROPIVACAINE HCL 2 MG/ML IJ SOLN
INTRAMUSCULAR | Status: AC
  Filled 2020-07-04: qty 20

## 2020-07-04 MED ORDER — DEXAMETHASONE SODIUM PHOSPHATE 10 MG/ML IJ SOLN
10.0000 mg | Freq: Once | INTRAMUSCULAR | Status: AC
  Administered 2020-07-04: 10 mg

## 2020-07-04 MED ORDER — DEXAMETHASONE SODIUM PHOSPHATE 10 MG/ML IJ SOLN
INTRAMUSCULAR | Status: AC
  Filled 2020-07-04: qty 2

## 2020-07-04 MED ORDER — FENTANYL CITRATE (PF) 100 MCG/2ML IJ SOLN
INTRAMUSCULAR | Status: AC
  Filled 2020-07-04: qty 2

## 2020-07-04 MED ORDER — LIDOCAINE HCL (PF) 2 % IJ SOLN
INTRAMUSCULAR | Status: AC
  Filled 2020-07-04: qty 10

## 2020-07-04 MED ORDER — LIDOCAINE HCL 2 % IJ SOLN
20.0000 mL | Freq: Once | INTRAMUSCULAR | Status: AC
  Administered 2020-07-04: 200 mg

## 2020-07-04 MED ORDER — MIDAZOLAM HCL 5 MG/5ML IJ SOLN
1.0000 mg | INTRAMUSCULAR | Status: DC | PRN
  Administered 2020-07-04: 1 mg via INTRAVENOUS
  Filled 2020-07-04: qty 5

## 2020-07-04 NOTE — Progress Notes (Signed)
Safety precautions to be maintained throughout the outpatient stay will include: orient to surroundings, keep bed in low position, maintain call bell within reach at all times, provide assistance with transfer out of bed and ambulation.  

## 2020-07-04 NOTE — Progress Notes (Signed)
PROVIDER NOTE: Information contained herein reflects review and annotations entered in association with encounter. Interpretation of such information and data should be left to medically-trained personnel. Information provided to patient can be located elsewhere in the medical record under "Patient Instructions". Document created using STT-dictation technology, any transcriptional errors that may result from process are unintentional.    Patient: Kimberly [name]  Service Category: Procedure  Provider: Gillis Santa, MD  DOB: 1960/12/02  DOS: 07/04/2020  Location: [facility]  MRN: [id number]  Setting: Ambulatory - outpatient  Referring Provider: Dion Body, MD  Type: Established Patient  Specialty: Interventional Pain Management  PCP: Dion Body, MD   Primary Reason for Visit: Interventional Pain Management Treatment. CC: Neck Pain  Procedure:          Anesthesia, Analgesia, Anxiolysis:  Type: Cervical Facet Medial Branch Block(s)  #1  Primary Purpose: Diagnostic Region: Posterolateral cervical spine Level:  C4, C5, C6,  Medial Branch Level(s). Injecting these levels blocks the C4-5, C5-6,  cervical facet joints. Laterality: Bilateral  Type: Moderate (Conscious) Sedation combined with Local Anesthesia Indication(s): Analgesia and Anxiety Route: Intravenous (IV) IV Access: Secured Sedation: Meaningful verbal contact was maintained at all times during the procedure  Local Anesthetic: Lidocaine 1-2%  Position: Prone with head of the table raised to facilitate breathing.   Indications: 1. Cervical spondylosis without myelopathy   2. Chronic pain syndrome    Pain Score: Pre-procedure: 8 /10 Post-procedure: 0-No pain/10   Pre-op H&P Assessment:  Kimberly [name] is a 59 y.o. (year old), female patient, seen today for interventional treatment. She  has a past surgical history that includes Abdominal hysterectomy; Rotator cuff repair (Left); Foreign Body Removal  (Left); Carpal tunnel release (Left, 02/16/2013); Carpal tunnel release (Right, 03/27/2013); Cesarean section; Anterior cervical decomp/discectomy fusion (N/A, 08/06/2014); Colonoscopy; Nasal sinus surgery (N/A, 04/13/2015); Shoulder arthroscopy with distal clavicle resection (Left, 05/18/2015); Shoulder acromioplasty (Left, 05/18/2015); Cholecystectomy (N/A, 06/17/2015); Neck surgery (2016); Anterior cervical decomp/discectomy fusion (N/A, 07/04/2016); Posterior cervical fusion/foraminotomy (N/A, 11/13/2016); Knee arthroscopy; Extracorporeal shock wave lithotripsy (Left, 09/04/2018); and Colonoscopy with propofol (N/A, 05/06/2019). Kimberly [name] has a current medication list which includes the following prescription(s): acetaminophen, acyclovir, alprazolam, amlodipine, cholecalciferol, diazepam, duloxetine, escitalopram, estradiol, liothyronine, meloxicam, methocarbamol, nortriptyline, [START ON 07/17/2020] oxycodone hcl, [START ON 08/16/2020] oxycodone hcl, pantoprazole, pregabalin, tetrahydrozoline hcl, nystatin, prednisone, rizatriptan, and valacyclovir, and the following Facility-Administered Medications: midazolam. Her primarily concern today is the Neck Pain  Initial Vital Signs:  Pulse/HCG Rate: 88ECG Heart Rate: 88 Temp: (!) 97.2 F (36.2 C) Resp: 15 BP: (!) 130/92 SpO2: 100 %  BMI: Estimated body mass index is 27.4 kg/m as calculated from the following:   Height as of this encounter: 5\' 1"  (1.549 m).   Weight as of this encounter: 145 lb (65.8 kg).  Risk Assessment: Allergies: Reviewed. She is allergic to shellfish allergy, peanut-containing drug products, codeine, diphtheria toxoid, and tetanus toxoids.  Allergy Precautions: None required Coagulopathies: Reviewed. None identified.  Blood-thinner therapy: None at this time Active Infection(s): Reviewed. None identified. Kimberly [name] is afebrile  Site Confirmation: Kimberly [name] was asked to confirm the procedure and laterality before marking the  site Procedure checklist: Completed Consent: Before the procedure and under the influence of no sedative(s), amnesic(s), or anxiolytics, the patient was informed of the treatment options, risks and possible complications. To fulfill our ethical and legal obligations, as recommended by the American Medical Association's Code of Ethics, I have informed the patient of my clinical impression; the nature and purpose  of the treatment or procedure; the risks, benefits, and possible complications of the intervention; the alternatives, including doing nothing; the risk(s) and benefit(s) of the alternative treatment(s) or procedure(s); and the risk(s) and benefit(s) of doing nothing. The patient was provided information about the general risks and possible complications associated with the procedure. These may include, but are not limited to: failure to achieve desired goals, infection, bleeding, organ or nerve damage, allergic reactions, paralysis, and death. In addition, the patient was informed of those risks and complications associated to Spine-related procedures, such as failure to decrease pain; infection (i.e.: Meningitis, epidural or intraspinal abscess); bleeding (i.e.: epidural hematoma, subarachnoid hemorrhage, or any other type of intraspinal or peri-dural bleeding); organ or nerve damage (i.e.: Any type of peripheral nerve, nerve root, or spinal cord injury) with subsequent damage to sensory, motor, and/or autonomic systems, resulting in permanent pain, numbness, and/or weakness of one or several areas of the body; allergic reactions; (i.e.: anaphylactic reaction); and/or death. Furthermore, the patient was informed of those risks and complications associated with the medications. These include, but are not limited to: allergic reactions (i.e.: anaphylactic or anaphylactoid reaction(s)); adrenal axis suppression; blood sugar elevation that in diabetics may result in ketoacidosis or comma; water retention  that in patients with history of congestive heart failure may result in shortness of breath, pulmonary edema, and decompensation with resultant heart failure; weight gain; swelling or edema; medication-induced neural toxicity; particulate matter embolism and blood vessel occlusion with resultant organ, and/or nervous system infarction; and/or aseptic necrosis of one or more joints. Finally, the patient was informed that Medicine is not an exact science; therefore, there is also the possibility of unforeseen or unpredictable risks and/or possible complications that may result in a catastrophic outcome. The patient indicated having understood very clearly. We have given the patient no guarantees and we have made no promises. Enough time was given to the patient to ask questions, all of which were answered to the patient's satisfaction. Ms. Taves has indicated that she wanted to continue with the procedure. Attestation: I, the ordering provider, attest that I have discussed with the patient the benefits, risks, side-effects, alternatives, likelihood of achieving goals, and potential problems during recovery for the procedure that I have provided informed consent. Date  Time: 07/04/2020  7:55 AM  Pre-Procedure Preparation:  Monitoring: As per clinic protocol. Respiration, ETCO2, SpO2, BP, heart rate and rhythm monitor placed and checked for adequate function Safety Precautions: Patient was assessed for positional comfort and pressure points before starting the procedure. Time-out: I initiated and conducted the "Time-out" before starting the procedure, as per protocol. The patient was asked to participate by confirming the accuracy of the "Time Out" information. Verification of the correct person, site, and procedure were performed and confirmed by me, the nursing staff, and the patient. "Time-out" conducted as per Joint Commission's Universal Protocol (UP.01.01.01). Time: 0830  Description of Procedure:           Laterality: Bilateral. The procedure was performed in identical fashion on both sides. Level:  C4, C5, C6, Medial Branch Level(s). Area Prepped: Posterior Cervico-thoracic Region DuraPrep (Iodine Povacrylex [0.7% available iodine] and Isopropyl Alcohol, 74% w/w) Safety Precautions: Aspiration looking for blood return was conducted prior to all injections. At no point did we inject any substances, as a needle was being advanced. Before injecting, the patient was told to immediately notify me if she was experiencing any new onset of "ringing in the ears, or metallic taste in the mouth". No attempts were  made at seeking any paresthesias. Safe injection practices and needle disposal techniques used. Medications properly checked for expiration dates. SDV (single dose vial) medications used. After the completion of the procedure, all disposable equipment used was discarded in the proper designated medical waste containers. Local Anesthesia: Protocol guidelines were followed. The patient was positioned over the fluoroscopy table. The area was prepped in the usual manner. The time-out was completed. The target area was identified using fluoroscopy. A 12-in long, straight, sterile hemostat was used with fluoroscopic guidance to locate the targets for each level blocked. Once located, the skin was marked with an approved surgical skin marker. Once all sites were marked, the skin (epidermis, dermis, and hypodermis), as well as deeper tissues (fat, connective tissue and muscle) were infiltrated with a small amount of a short-acting local anesthetic, loaded on a 10cc syringe with a 25G, 1.5-in  Needle. An appropriate amount of time was allowed for local anesthetics to take effect before proceeding to the next step. Local Anesthetic: Lidocaine 2.0% The unused portion of the local anesthetic was discarded in the proper designated containers.  Technical explanation of process:  C4 Medial Branch Nerve Block (MBB): The  target area for the C4 dorsal medial articular branch is the lateral concave waist of the articular pillar of C4. Under fluoroscopic guidance, a Quincke needle was inserted until contact was made with os over the postero-lateral aspect of the articular pillar of C4 (target area). After negative aspiration for blood, 1 mL of the nerve block solution was injected without difficulty or complication. The needle was removed intact. C5 Medial Branch Nerve Block (MBB): The target area for the C5 dorsal medial articular branch is the lateral concave waist of the articular pillar of C5. Under fluoroscopic guidance, a Quincke needle was inserted until contact was made with os over the postero-lateral aspect of the articular pillar of C5 (target area). After negative aspiration for blood, 1 mL of the nerve block solution was injected without difficulty or complication. The needle was removed intact. C6 Medial Branch Nerve Block (MBB): The target area for the C6 dorsal medial articular branch is the lateral concave waist of the articular pillar of C6. Under fluoroscopic guidance, a Quincke needle was inserted until contact was made with os over the postero-lateral aspect of the articular pillar of C6 (target area). After negative aspiration for blood, 1 mL of the nerve block solution was injected without difficulty or complication. The needle was removed intact.  Procedural Needles: 25-gauge, 3.5-inch, Quincke needles used for all levels. Nerve block solution: 6 cc solution made of 4 cc of 0.2% ropivacaine, 2 cc of Decadron 10 mg/cc.  1 cc injected at each level above bilaterally.   Once the entire procedure was completed, the treated area was cleaned, making sure to leave some of the prepping solution back to take advantage of its long term bactericidal properties.  Anatomy Reference Guide:       Vitals:   07/04/20 0845 07/04/20 0850 07/04/20 0900 07/04/20 0911  BP: (!) 138/103 (!) 144/96 (!) 139/100 128/86   Pulse: 95     Resp: 16 20 16 13   Temp:  97.9 F (36.6 C)  (!) 97.3 F (36.3 C)  TempSrc:  Temporal  Temporal  SpO2: 97% 98% 97% 100%  Weight:      Height:        Start Time: 0830 hrs. End Time:   hrs.  Imaging Guidance (Spinal):          Type  of Imaging Technique: Fluoroscopy Guidance (Spinal) Indication(s): Assistance in needle guidance and placement for procedures requiring needle placement in or near specific anatomical locations not easily accessible without such assistance. Exposure Time: Please see nurses notes. Contrast: None used. Fluoroscopic Guidance: I was personally present during the use of fluoroscopy. "Tunnel Vision Technique" used to obtain the best possible view of the target area. Parallax error corrected before commencing the procedure. "Direction-depth-direction" technique used to introduce the needle under continuous pulsed fluoroscopy. Once target was reached, antero-posterior, oblique, and lateral fluoroscopic projection used confirm needle placement in all planes. Images permanently stored in EMR. Interpretation: No contrast injected. I personally interpreted the imaging intraoperatively. Adequate needle placement confirmed in multiple planes. Permanent images saved into the patient's record.  Antibiotic Prophylaxis:   Anti-infectives (From admission, onward)   None     Indication(s): None identified  Post-operative Assessment:  Post-procedure Vital Signs:  Pulse/HCG Rate: 9587 Temp: (!) 97.3 F (36.3 C) Resp: 13 BP: 128/86 SpO2: 100 %  EBL: None  Complications: No immediate post-treatment complications observed by team, or reported by patient.  Note: The patient tolerated the entire procedure well. A repeat set of vitals were taken after the procedure and the patient was kept under observation following institutional policy, for this type of procedure. Post-procedural neurological assessment was performed, showing return to baseline, prior to  discharge. The patient was provided with post-procedure discharge instructions, including a section on how to identify potential problems. Should any problems arise concerning this procedure, the patient was given instructions to immediately contact us, at any time, without hesitation. In any case, we plan to contact the patient by telephone for a follow-up status report regarding this interventional procedure.  Comments:  No additional relevant information.  Plan of Care  Orders:  Orders Placed This Encounter  Procedures  . [facility] C-ARM 1-60 MIN NO REPORT    Intraoperative interpretation by procedural physician at [facility].    Standing Status:   Standing    Number of Occurrences:   1    Order Specific Question:   Reason for exam:    Answer:   Assistance in needle guidance and placement for procedures requiring needle placement in or near specific anatomical locations not easily accessible without such assistance.   Chronic Opioid Analgesic:   01/08/2019  3   11/04/2018  Oxycodone-Acetaminophen 10-325  60.00 30 Cr Kin   [id number]   [facility] (1123)   0  30.00 MME  Medicare   [place]   }   Medications ordered for procedure: Meds ordered this encounter  Medications  . lidocaine (XYLOCAINE) 2 % (with pres) injection 400 mg  . midazolam (VERSED) 5 MG/5ML injection 1-2 mg    Make sure Flumazenil is available in the pyxis when using this medication. If oversedation occurs, administer 0.2 mg IV over 15 sec. If after 45 sec no response, administer 0.2 mg again over 1 min; may repeat at 1 min intervals; not to exceed 4 doses (1 mg)  . fentaNYL (SUBLIMAZE) injection 25-50 mcg    Make sure Narcan is available in the pyxis when using this medication. In the event of respiratory depression (RR< 8/min): Titrate NARCAN (naloxone) in increments of 0.1 to 0.2 mg IV at 2-3 minute intervals, until desired degree of reversal.  . ropivacaine (PF) 2 mg/mL (0.2%) (NAROPIN) injection 9 mL  .  dexamethasone (DECADRON) injection 10 mg  . dexamethasone (DECADRON) injection 10 mg  . ropivacaine (PF) 2 mg/mL (0.2%) (NAROPIN)  injection 9 mL   Medications administered: We administered lidocaine, midazolam, fentaNYL, ropivacaine (PF) 2 mg/mL (0.2%), dexamethasone, dexamethasone, and ropivacaine (PF) 2 mg/mL (0.2%).  See the medical record for exact dosing, route, and time of administration.  Follow-up plan:   Return in about 5 weeks (around 08/08/2020) for Post Procedure Evaluation, virtual.      cervical TPI, cervical facet medial branch nerve block, suprascapular nerve block, bilateral C4, C5, C6   cervical facet medial branch nerve block 07/04/2020      Recent Visits Date Type Provider Dept  06/16/20 Office Visit Gillis Santa, MD [facility]  04/05/20 Office Visit Gillis Santa, MD [facility]  Showing recent visits within past 90 days and meeting all other requirements Today's Visits Date Type Provider Dept  07/04/20 Procedure visit Gillis Santa, MD [facility]  Showing today's visits and meeting all other requirements Future Appointments Date Type Provider Dept  08/16/20 Appointment Gillis Santa, MD [facility]  Showing future appointments within next 90 days and meeting all other requirements  Disposition: Discharge home  Discharge (Date  Time): 07/04/2020; 0912 hrs.   Primary Care Physician: Dion Body, MD Location: [facility] Note by: Gillis Santa, MD Date: 07/04/2020; Time: 9:31 AM  Disclaimer:  Medicine is not an exact science. The only guarantee in medicine is that nothing is guaranteed. It is important to note that the decision to proceed with this intervention was based on the information collected from the patient. The Data and conclusions were drawn from the patient's questionnaire, the interview, and the physical examination. Because the information was provided in large part by the  patient, it cannot be guaranteed that it has not been purposely or unconsciously manipulated. Every effort has been made to obtain as much relevant data as possible for this evaluation. It is important to note that the conclusions that lead to this procedure are derived in large part from the available data. Always take into account that the treatment will also be dependent on availability of resources and existing treatment guidelines, considered by other Pain Management Practitioners as being common knowledge and practice, at the time of the intervention. For Medico-Legal purposes, it is also important to point out that variation in procedural techniques and pharmacological choices are the acceptable norm. The indications, contraindications, technique, and results of the above procedure should only be interpreted and judged by a Board-Certified Interventional Pain Specialist with extensive familiarity and expertise in the same exact procedure and technique.

## 2020-07-04 NOTE — Patient Instructions (Signed)

## 2020-07-05 ENCOUNTER — Encounter: Payer: Medicare Other | Admitting: Student in an Organized Health Care Education/Training Program

## 2020-07-07 ENCOUNTER — Telehealth: Payer: Self-pay

## 2020-07-07 NOTE — Telephone Encounter (Signed)
Spoke with patient.  States that the pain eased off the day of procedure but it came back the next day.  Explained to patient that it took several days for the steroid to take effect and she needed to give it a few more days.  Instructed patient to read her paperwork and to call us back if she had any further questions or concerns.

## 2020-07-07 NOTE — Telephone Encounter (Signed)
She had a procedure Monday. She wants to let Dr. Holley Raring know that she is hurting a lot in the back of her head. She states she is hurting worse now. The pain wakes her up at night.

## 2020-07-15 ENCOUNTER — Telehealth: Payer: Self-pay | Admitting: Student in an Organized Health Care Education/Training Program

## 2020-07-15 NOTE — Telephone Encounter (Signed)
Patient is still hurting, no relief from procedure, please call Monday.

## 2020-07-18 NOTE — Telephone Encounter (Signed)
Patient notified

## 2020-07-18 NOTE — Telephone Encounter (Signed)
Wait till end of January. Can take APAP 500 mg QID prn as pain adjunct

## 2020-07-18 NOTE — Telephone Encounter (Signed)
Dr Holley Raring, I called returned the patients call.  She states that the pain is not getting any better and the pain medicine is not working.  She did state however that the pain did go away the day of the procedure.  Her follow up appointment is the end of January.  Is there anything you would like to do, or just wait until her appointment the end of January?

## 2020-08-11 IMAGING — CT CT NECK W/ CM
5 of 6 series · 14 of 33 positions shown, 16 images · IV contrast (iopamidol)
Comparison: Prior radiograph from 04/10/2017 as well as prior CT
from 02/04/2017.

CLINICAL DATA: Initial evaluation for continued neck pain with
difficulty swallowing since prior trauma in 1209. History of 3 prior
neck surgeries.

EXAM:
CT NECK WITH CONTRAST
TECHNIQUE: Multidetector CT imaging of the neck was performed using the
standard protocol following the bolus administration of intravenous
contrast.
CONTRAST:  75mL [id number] IOPAMIDOL ([id number]) INJECTION 61%

[Series 2: axial neck neck (person_name) · axial · 0.51mm/px · z∈[-709,-639]mm · 2 of 105 slices shown, 3 images]
[im 35/105  soft-tissue]
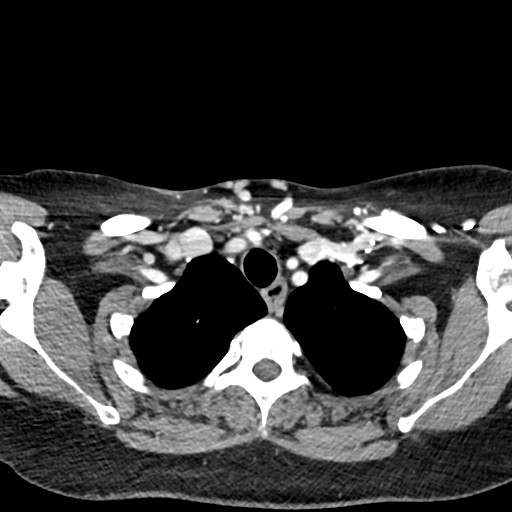
[im 35/105  bone]
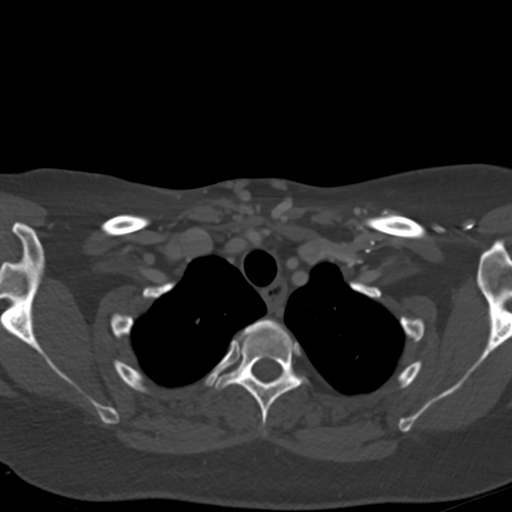
[im 70/105  bone]
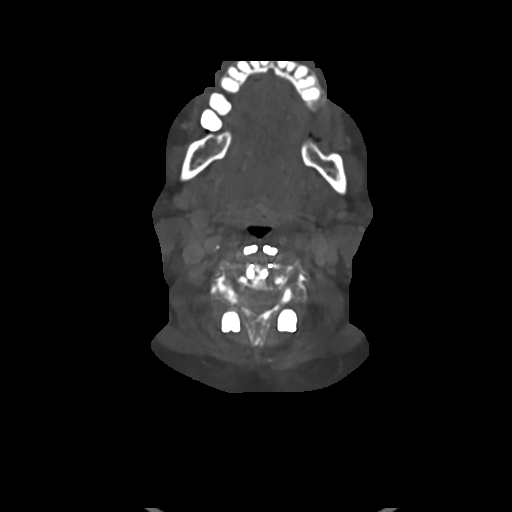

[Series 3: axial bone neck · axial · 0.51mm/px · z∈[-709,-639]mm · 2 of 105 slices shown]
[im 35/105  bone]
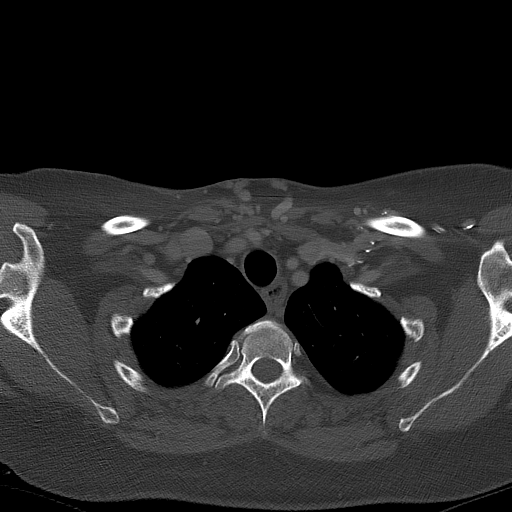
[im 70/105  bone]
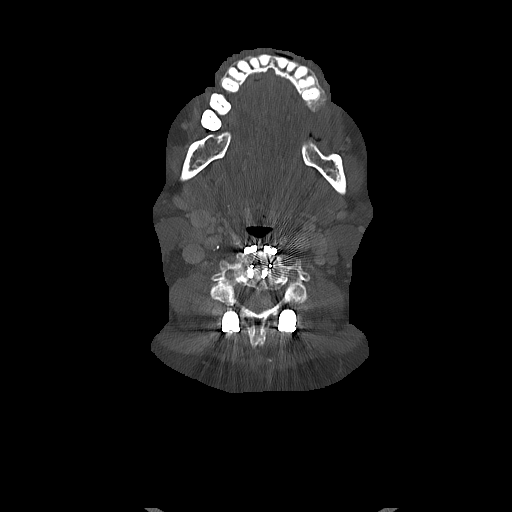

[Series 5: coronal neck neck (person_name) · coronal · 0.41mm/px · 3 of 129 slices shown]
[im 26/129  bone]
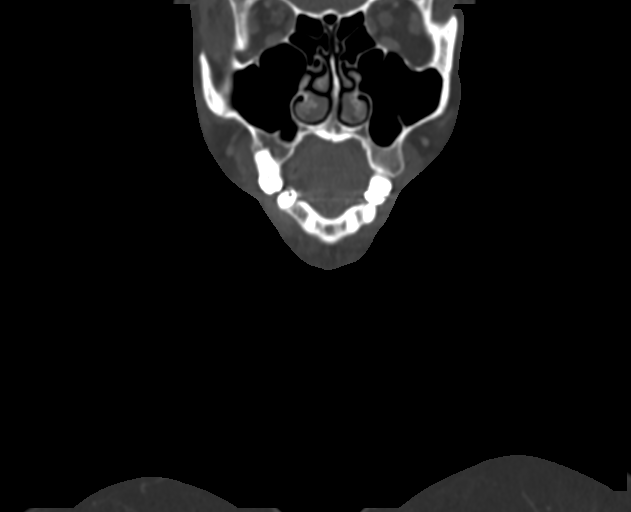
[im 52/129  bone]
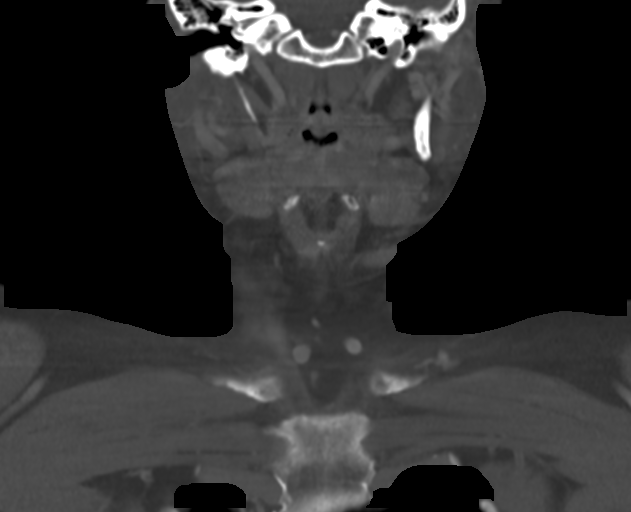
[im 77/129  bone]
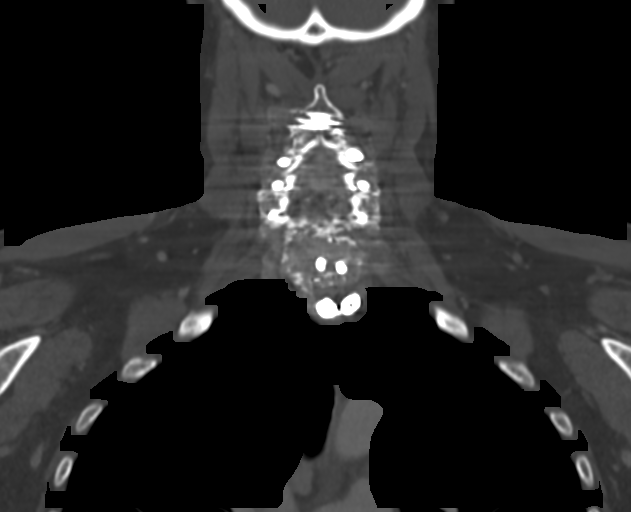

[Series 7: sagittal neck neck (person_name) · sagittal · 0.41mm/px · 5 of 129 slices shown, 6 images]
[im 43/129  bone]
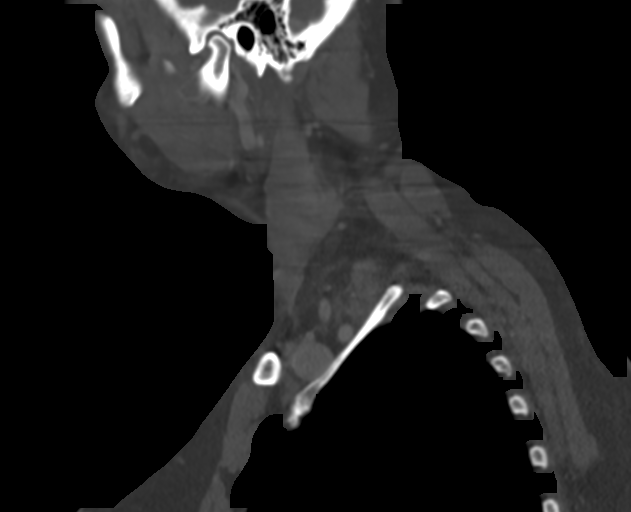
[im 54/129  bone]
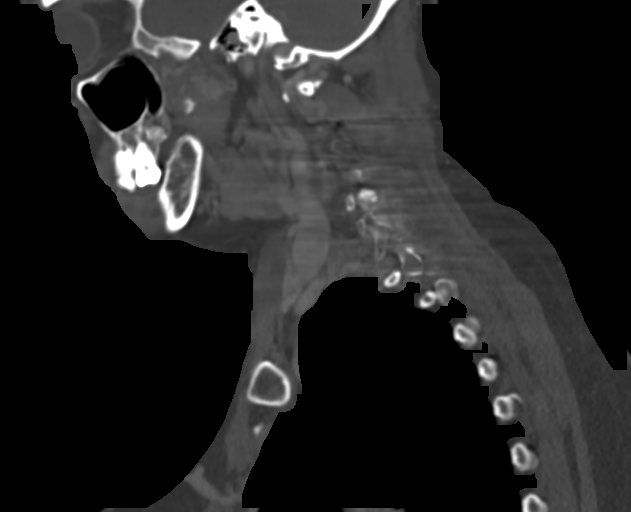
[im 65/129  soft-tissue]
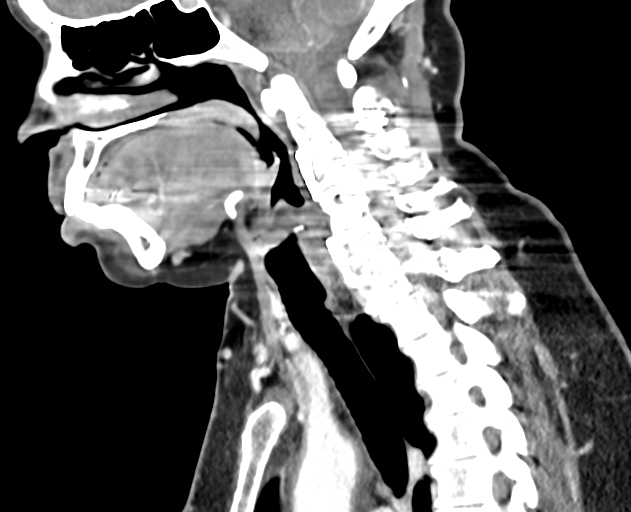
[im 65/129  bone]
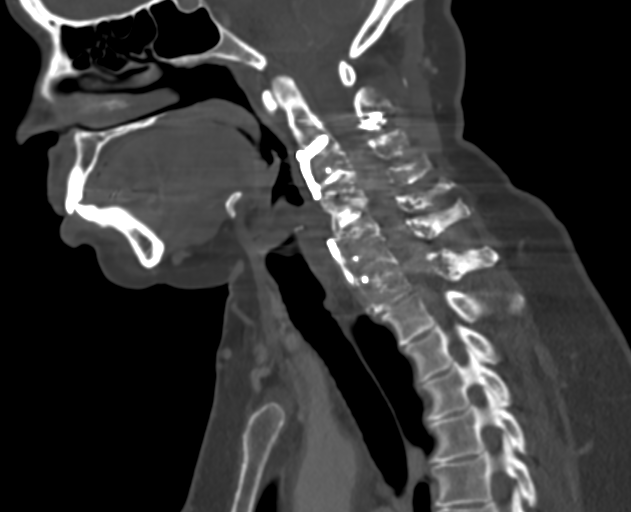
[im 75/129  bone]
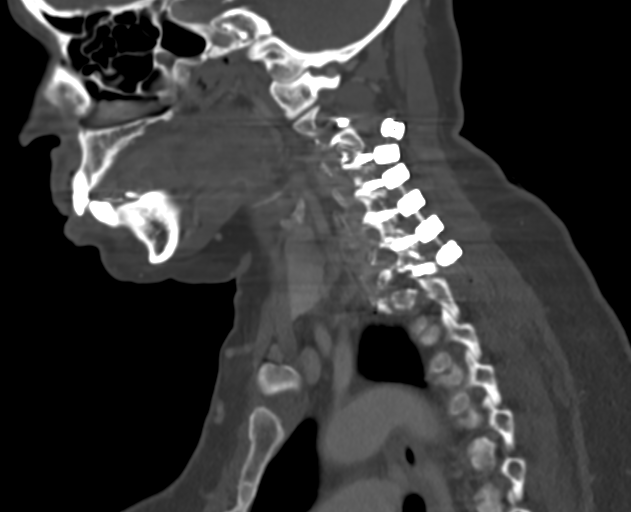
[im 86/129  bone]
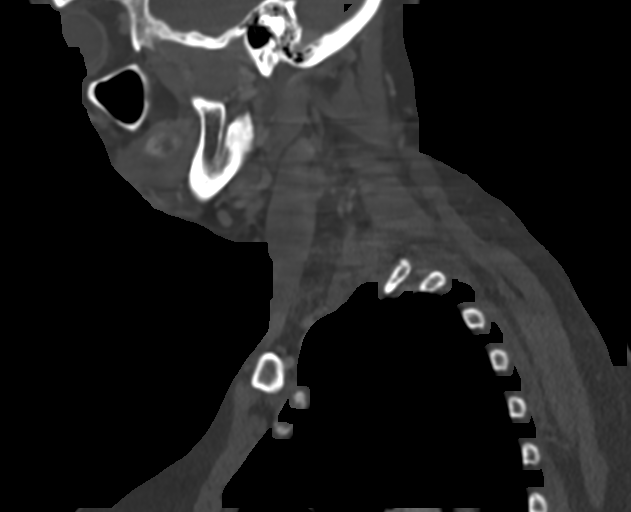

[Series 9: ax oropharynx neck neck (person_name) · axial · 0.51mm/px · z∈[-709,-639]mm · 2 of 105 slices shown]
[im 35/105  bone]
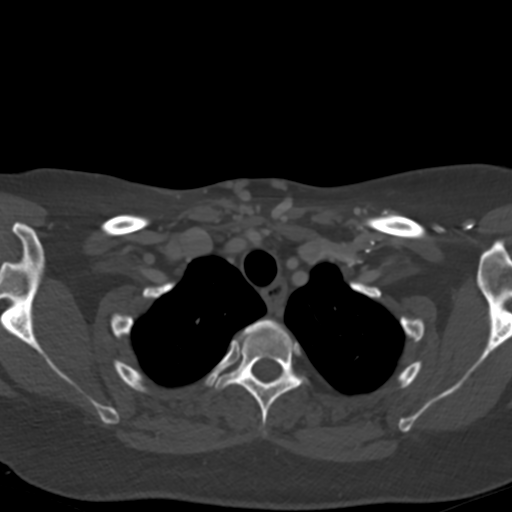
[im 70/105  bone]
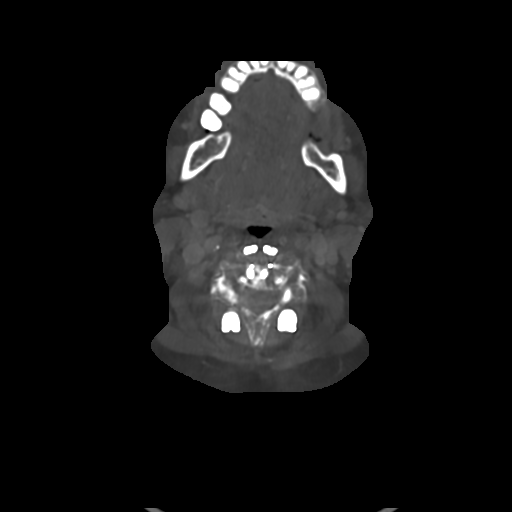

[14 of 33 positions shown; findings below may reference images not displayed]

FINDINGS: Pharynx and larynx: Oral cavity within normal limits without mass
lesion or loculated fluid collection. No acute abnormality about the
dentition. Palatine tonsils symmetric and within normal limits.
Parapharyngeal fat maintained. Nasopharynx within normal limits. No
retropharyngeal collection. Epiglottis within normal limits.
Vallecula partially effaced by the lingual tonsils. Remainder of the
hypopharynx and supraglottic larynx within normal limits. Piriform
sinuses are clear. True cords apposed and not well assessed, but are
grossly symmetric and within normal limits. Subglottic airway clear.

Salivary glands: Salivary glands including the parotid and
submandibular glands within normal limits.

Thyroid: Thyroid within normal limits.

Lymph nodes: No pathologically enlarged lymph nodes identified
within the neck.

Vascular: Normal intravascular enhancement seen within the neck.
Mild atheromatous change about the aortic arch and right carotid
bifurcation without significant stenosis. Vertebral arteries not
well assessed due to extensive streak artifact from cervical fusion
hardware. Scattered subcutaneous venous collaterals noted within the
anterior aspect of the upper chest.

Limited intracranial: Unremarkable

Visualized orbits: Visualized globes and orbital soft tissues within
normal limits.

Mastoids and visualized paranasal sinuses: Visualized paranasal
sinuses are clear. Visualize mastoids and middle ear cavities are
well pneumatized and free of fluid.

Skeleton: Extensive prior fusion seen throughout the cervical spine,
extending from C2 through T1 both anteriorly and posteriorly.
Overall, appearance is grossly stable from previous CT. No obvious
adverse features identified on this exam. No discrete lytic or
blastic osseous lesions.

Upper chest: Visualized upper chest within normal limits. Partially
visualized lungs are clear.

Other: None.
IMPRESSION: 1. No acute abnormality identified within the neck. No findings to
explain patient's symptoms identified.
2. Extensive cervical fusion extending from C2 through T1, grossly
stable from previous without adverse features.

## 2020-08-15 ENCOUNTER — Telehealth: Payer: Self-pay

## 2020-08-15 NOTE — Telephone Encounter (Signed)
Received message on clinic phone stating that she was completely out of her oxycodone and wanted Dr Holley Raring to call her in her refill. Returned patient call and  Informed patient that her medications were due to last until tomorrow and that she had an appointment tomorrow and would have to wait until tomorrow to get her prescription.  Patient states understanding.

## 2020-08-16 ENCOUNTER — Encounter: Payer: Self-pay | Admitting: Student in an Organized Health Care Education/Training Program

## 2020-08-16 ENCOUNTER — Other Ambulatory Visit: Payer: Self-pay

## 2020-08-16 ENCOUNTER — Ambulatory Visit
Payer: Medicare Other | Attending: Student in an Organized Health Care Education/Training Program | Admitting: Student in an Organized Health Care Education/Training Program

## 2020-08-16 VITALS — BP 139/93 | HR 110 | Temp 97.0°F | Resp 16 | Ht 61.0 in | Wt 143.0 lb

## 2020-08-16 DIAGNOSIS — Q761 Klippel-Feil syndrome: Secondary | ICD-10-CM | POA: Diagnosis not present

## 2020-08-16 DIAGNOSIS — G894 Chronic pain syndrome: Secondary | ICD-10-CM | POA: Insufficient documentation

## 2020-08-16 DIAGNOSIS — M47812 Spondylosis without myelopathy or radiculopathy, cervical region: Secondary | ICD-10-CM | POA: Insufficient documentation

## 2020-08-16 MED ORDER — OXYCODONE HCL 10 MG PO TABS: 10.0000 mg | ORAL_TABLET | Freq: Two times a day (BID) | ORAL | 0 refills | Status: DC | PRN

## 2020-08-16 NOTE — Progress Notes (Signed)
PROVIDER NOTE: Information contained herein reflects review and annotations entered in association with encounter. Interpretation of such information and data should be left to medically-trained personnel. Information provided to patient can be located elsewhere in the medical record under "Patient Instructions". Document created using STT-dictation technology, any transcriptional errors that may result from process are unintentional.    Patient: Kimberly [name]  Service Category: E/M  Provider: Gillis Santa, MD  DOB: March 03, 1961  DOS: 08/16/2020  Specialty: Interventional Pain Management  MRN: [id number]  Setting: Ambulatory outpatient  PCP: Dion Body, MD  Type: Established Patient    Referring Provider: Dion Body, MD  Location: Office  Delivery: Face-to-face     HPI  Kimberly [name], a 60 y.o. year old female, is here today because of her Cervical fusion syndrome [Q76.1]. Ms. Rotert primary complain today is Headache Last encounter: My last encounter with her was on 07/15/2020. Pertinent problems: Ms. Cabell has SHOULDER PAIN; IMPINGEMENT SYNDROME; RUPTURE ROTATOR CUFF; CTS (carpal tunnel syndrome); Cervical spondylosis without myelopathy; Cervical stenosis of spinal canal; Pseudoarthrosis of cervical spine ([place]); Numbness and tingling; Vitamin D deficiency; Cervical fusion syndrome; Hx of cervical spine surgery; Chronic pain syndrome; Cervicalgia; Controlled substance agreement signed; Depression, major, single episode, complete remission ([place]); Cervical myofascial pain syndrome; and Occipital headache on their pertinent problem list. Pain Assessment: Severity of Chronic pain is reported as a 8 /10. Location: Head  /neck. Onset: More than a month ago. Quality: Throbbing. Timing: Intermittent. Modifying factor(s): medications, heat. Vitals:  height is 5' 1" (1.549 m) and weight is 143 lb (64.9 kg). Her temporal temperature is 97 F (36.1 C) (abnormal). Her blood pressure is  139/93 (abnormal) and her pulse is 110 (abnormal). Her respiration is 16 and oxygen saturation is 100%.   Reason for encounter: medication management.    Patient presents today for medication management. No significant change in her medical history since her last visit. She states that generally in the winter months her neck and scalp pain are generally increased. Today her pain is an 8 and generally ranges from a 6 to a 7. She states that she is having to lay down more frequently to help manage her pain. She states that she is not able to sleep supine but has to lay on her side. We discussed various pillow options that she could use to help support her neck that could keep it in alignment. Otherwise she continues her gabapentin which is managed by neurology. She is not on Lyrica. I will refill her oxycodone as below. I informed her of red flags such as weakness in her upper extremity, loss of sensation, severe persistent pain that is much higher than her baseline which would prompt her to contact me. Patient endorsed understanding.  Unfortunately, the patient did not receive any significant benefit after her diagnostic C4, C5, C6 facet medial branch nerve block. Do not recommend repeating or pursuing RFA at this point.  Pharmacotherapy Assessment   Analgesic: Oxycodone 10 mg BID prn #60/month Monitoring:  PMP: PDMP reviewed during this encounter.       Pharmacotherapy: No side-effects or adverse reactions reported. Compliance: No problems identified. Effectiveness: Clinically acceptable.  Landis Martins, RN  08/16/2020 11:41 AM  Sign when Signing Visit Nursing Pain Medication Assessment:  Safety precautions to be maintained throughout the outpatient stay will include: orient to surroundings, keep bed in low position, maintain call bell within reach at all times, provide assistance with transfer out of bed and ambulation.  Medication  Inspection Compliance: Pill count conducted under aseptic  conditions, in front of the patient. Neither the pills nor the bottle was removed from the patient's sight at any time. Once count was completed pills were immediately returned to the patient in their original bottle.  Medication: Oxycodone IR Pill/Patch Count: 0 of 60 pills remain Pill/Patch Appearance: Markings consistent with prescribed medication Bottle Appearance: Standard pharmacy container. Clearly labeled. Filled Date: 07/17/2020 Last Medication intake:  Today    UDS:  Summary  Date Value Ref Range Status  12/31/2019 Note  Final    Comment:    ==================================================================== [employer] (MW) ==================================================================== Test                             Result       Flag       Units Drug Present and Declared for Prescription Verification   Oxycodone                      1345         EXPECTED   ng/mg creat   Oxymorphone                    >2786        EXPECTED   ng/mg creat   Noroxycodone                   2665         EXPECTED   ng/mg creat   Noroxymorphone                 1060         EXPECTED   ng/mg creat    Sources of oxycodone are scheduled prescription medications.    Oxymorphone, noroxycodone, and noroxymorphone are expected    metabolites of oxycodone. Oxymorphone is also available as a    scheduled prescription medication. Drug Absent but Declared for Prescription Verification   Diazepam                       Not Detected UNEXPECTED ng/mg creat   Alprazolam                     Not Detected UNEXPECTED ng/mg creat ==================================================================== Test                      Result    Flag   Units      Ref Range   Creatinine              359              mg/dL      >=20 ==================================================================== Declared Medications:  The flagging and interpretation on this report are based on the  following declared  medications.  Unexpected results may arise from  inaccuracies in the declared medications.  **Note: The testing scope of this panel includes these medications:  Alprazolam (Xanax)  Diazepam (Valium)  Oxycodone  **Note: The testing scope of this panel does not include the  following reported medications:  Acetaminophen (Tylenol)  Acyclovir (Zovirax)  Amlodipine (Norvasc)  Amoxicillin (Amoxil)  Cholecalciferol  Cyclobenzaprine (Flexeril)  Diclofenac (Voltaren)  Duloxetine (Cymbalta)  Escitalopram (Lexapro)  Estradiol (Estrace)  Eye Drop  Gabapentin (Neurontin)  Liothyronine (Cytomel)  Meclizine (Antivert)  Methocarbamol (Robaxin)  Nortriptyline (Pamelor)  Nystatin (Mycostatin)  Oxybutynin (Ditropan)  Pantoprazole (Protonix)  Prednisone (Deltasone)  Pregabalin (Lyrica)  Topical  Valacyclovir (Valtrex) ==================================================================== For clinical consultation, please call [phone]. ====================================================================      ROS  Constitutional: Denies any fever or chills Gastrointestinal: No reported hemesis, hematochezia, vomiting, or acute GI distress Musculoskeletal: Neck pain Neurological: No reported episodes of acute onset apraxia, aphasia, dysarthria, agnosia, amnesia, paralysis, loss of coordination, or loss of consciousness  Medication Review  ALPRAZolam, Cholecalciferol, DULoxetine, Oxycodone HCl, Tetrahydrozoline HCl, acetaminophen, acyclovir, amLODipine, diazepam, escitalopram, estradiol, gabapentin, liothyronine, meloxicam, methocarbamol, nortriptyline, nystatin, pantoprazole, and rizatriptan  History Review  Allergy: Ms. Wieseler is allergic to shellfish allergy, peanut-containing drug products, codeine, diphtheria toxoid, and tetanus toxoids. Drug: Ms. Storck  reports no history of drug use. Alcohol:  reports no history of alcohol use. Tobacco:  reports that she has never smoked. She  has never used smokeless tobacco. Social: Ms. Panozzo  reports that she has never smoked. She has never used smokeless tobacco. She reports that she does not drink alcohol and does not use drugs. Medical:  has a past medical history of Anxiety, Arthritis, Asthma, Balance problem, Brain cyst, Depression, Difficult intubation, Dyspnea, Flu (08/10/2018), GERD (gastroesophageal reflux disease), Headache, Herpes, History of kidney stones, History of pneumonia, Hypertension, Inflammation of shoulder joint, Memory difficulties, Neuromuscular disorder ([place]), Pneumothorax on right, PONV (postoperative nausea and vomiting), Recurrent falls, and Urinary frequency. Surgical: Ms. Matusik  has a past surgical history that includes Abdominal hysterectomy; Rotator cuff repair (Left); Foreign Body Removal (Left); Carpal tunnel release (Left, 02/16/2013); Carpal tunnel release (Right, 03/27/2013); Cesarean section; Anterior cervical decomp/discectomy fusion (N/A, 08/06/2014); Colonoscopy; Nasal sinus surgery (N/A, 04/13/2015); Shoulder arthroscopy with distal clavicle resection (Left, 05/18/2015); Shoulder acromioplasty (Left, 05/18/2015); Cholecystectomy (N/A, 06/17/2015); Neck surgery (2016); Anterior cervical decomp/discectomy fusion (N/A, 07/04/2016); Posterior cervical fusion/foraminotomy (N/A, 11/13/2016); Knee arthroscopy; Extracorporeal shock wave lithotripsy (Left, 09/04/2018); and Colonoscopy with propofol (N/A, 05/06/2019). Family: family history includes Hypertension in her father and another family member.  Laboratory Chemistry Profile   Renal Lab Results  Component Value Date   BUN 9 07/01/2019   CREATININE 0.86 07/01/2019   BCR 10 07/01/2019   GFRAA 86 07/01/2019   GFRNONAA 75 07/01/2019     Hepatic Lab Results  Component Value Date   AST 26 07/31/2015   ALT 17 07/31/2015   ALBUMIN 4.1 07/31/2015   ALKPHOS 93 07/31/2015   LIPASE 23 07/15/2017     Electrolytes Lab Results  Component Value Date   NA  144 07/01/2019   K 4.8 07/01/2019   CL 103 07/01/2019   CALCIUM 10.3 (H) 07/01/2019     Bone No results found for: VD25OH, VD125OH2TOT, VF4734YZ7, QD6438VK1, 25OHVITD1, 25OHVITD2, 25OHVITD3, TESTOFREE, TESTOSTERONE   Inflammation (CRP: Acute Phase) (ESR: Chronic Phase) No results found for: CRP, ESRSEDRATE, LATICACIDVEN     Note: Above Lab results reviewed.   EXAM: MRI CERVICAL SPINE WITHOUT CONTRAST  TECHNIQUE: Multiplanar, multisequence MR imaging of the cervical spine was performed. No intravenous contrast was administered.  COMPARISON:  MRI of the cervical spine May 07, 2016.  FINDINGS: Alignment: Straightening of the cervical curvature.  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 Status post ACDF at C3-4 and C6-7-T1. Posterior fixation from C3 through T1. Hardware results in susceptibility artifact limiting evaluation the adjacent structures. No appreciable fracture, evidence of discitis or bone lesion.  Cord: Normal signal and morphology.  Posterior Fossa, vertebral arteries, paraspinal tissues: Negative.  Disc levels:  C2-3: No spinal canal or neural foraminal stenose.  C3-4: No spinal canal or neural foraminal stenosis.  C4-5:  No spinal canal stenosis. Uncovertebral and facet degenerative changes resulting moderate left neural foraminal narrowing.  C5-6: No spinal canal or neural foraminal stenosis.  C6-7: No spinal canal stenosis. Uncovertebral and facet degenerative changes resulting in moderate bilateral neural foraminal.  C7-T1: No spinal canal stenosis. Uncovertebral and facet degenerative changes resulting in moderate bilateral neural foraminal narrowing.  No significant change in the degree of neural foraminal stenosis compared to prior MRI.  IMPRESSION: 1. 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 2. Status post ACDF at C3-4 and C6-7 and posterior fixation from C3 through T1. 3. No spinal canal stenosis. 4. Moderate neural foraminal narrowing  on the left at C4-5, bilaterally at C6-7 and C7-T1.   Electronically Signed   By: Pedro Earls M.D.   On: 06/15/2020 12:29  Physical Exam  General appearance: Well nourished, well developed, and well hydrated. In no apparent acute distress Mental status: Alert, oriented x 3 (person, place, & time)       Respiratory: No evidence of acute respiratory distress Eyes: PERLA Vitals: BP (!) 139/93   Pulse (!) 110   Temp (!) 97 F (36.1 C) (Temporal)   Resp 16   Ht 5' 1" (1.549 m)   Wt 143 lb (64.9 kg)   SpO2 100%   BMI 27.02 kg/m  BMI: Estimated body mass index is 27.02 kg/m as calculated from the following:   Height as of this encounter: 5' 1" (1.549 m).   Weight as of this encounter: 143 lb (64.9 kg). Ideal: Ideal body weight: 47.8 kg (105 lb 6.1 oz) Adjusted ideal body weight: 54.6 kg (120 lb 6.8 oz)  Cervical Spine Exam  Skin & Axial Inspection:Well healed scar from previous spine surgery detected Alignment:Symmetrical Functional JQZ:ESPQ restricted ROM Stability:No instability detected Muscle Tone/Strength:Functionally intact. No obvious neuro-muscular anomalies detected. Sensory (Neurological):Neurogenic pain pattern Palpation:No palpable anomalies        Upper Extremity (UE) Exam    Side:Right upper extremity  Side:Left upper extremity   Skin & Extremity Inspection:Skin color, temperature, and hair growth are WNL. No peripheral edema or cyanosis. No masses, redness, swelling, asymmetry, or associated skin lesions. No contractures.  Skin & Extremity Inspection:Skin color, temperature, and hair growth are WNL. No peripheral edema or cyanosis. No masses, redness, swelling, asymmetry, or associated skin lesions. No contractures.   Functional ZRA:[id number] ROM  Functional GYB:[id number] ROM   Muscle Tone/Strength:Functionally intact. No obvious neuro-muscular anomalies detected.  Muscle  Tone/Strength:Functionally intact. No obvious neuro-muscular anomalies detected.   Sensory (Neurological):Unimpaired  Sensory (Neurological):Unimpaired   Palpation:No palpable anomalies  Palpation:No palpable anomalies   Provocative Test(s): Phalen's test:deferred Tinel's test:deferred Apley's scratch test (touch opposite shoulder): Action 1 (Across chest):deferred Action 2 (Overhead):deferred Action 3 (LB reach):deferred   Provocative Test(s): Phalen's test:deferred Tinel's test:deferred Apley's scratch test (touch opposite shoulder): Action 1 (Across chest):deferred Action 2 (Overhead):deferred Action 3 (LB reach):deferred      Assessment   Status Diagnosis  Persistent Persistent Persistent 1. Cervical fusion syndrome (C2-T1)   2. Cervical spondylosis without myelopathy   3. Chronic pain syndrome        Plan of Care   Ms. MARYLYNNE KEELIN has a current medication list which includes the following long-term medication(s): escitalopram, liothyronine, nortriptyline, oxycodone hcl, [START ON 09/15/2020] oxycodone hcl, [START ON 10/15/2020] oxycodone hcl, and rizatriptan.  Pharmacotherapy (Medications Ordered): Meds ordered this encounter  Medications  . Oxycodone HCl 10 MG TABS    Sig: Take 1 tablet (10 mg total) by mouth every 12 (twelve) hours  as needed. Must last 30 days.    Dispense:  60 tablet    Refill:  0    Chronic Pain. (STOP Act - Not applicable). Fill one day early if closed on scheduled refill date.  . Oxycodone HCl 10 MG TABS    Sig: Take 1 tablet (10 mg total) by mouth every 12 (twelve) hours as needed. Must last 30 days.    Dispense:  60 tablet    Refill:  0    Chronic Pain. (STOP Act - Not applicable). Fill one day early if closed on scheduled refill date.  . Oxycodone HCl 10 MG TABS    Sig: Take 1 tablet (10 mg total) by mouth every 12 (twelve) hours as needed. Must last 30 days.     Dispense:  60 tablet    Refill:  0    Chronic Pain. (STOP Act - Not applicable). Fill one day early if closed on scheduled refill date.   Follow-up plan:   Return in about 3 months (around 11/14/2020) for Medication Management, in person.     cervical TPI, cervical facet medial branch nerve block, suprascapular nerve block, bilateral C4, C5, C6   cervical facet medial branch nerve block 07/04/2020: Not effective, do not repeat.       Recent Visits Date Type Provider Dept  07/04/20 Procedure visit Gillis Santa, MD [facility]  06/16/20 Office Visit Gillis Santa, MD [facility]  Showing recent visits within past 90 days and meeting all other requirements Today's Visits Date Type Provider Dept  08/16/20 Office Visit Gillis Santa, MD [facility]  Showing today's visits and meeting all other requirements Future Appointments Date Type Provider Dept  11/10/20 Appointment Gillis Santa, MD [facility]  Showing future appointments within next 90 days and meeting all other requirements  I discussed the assessment and treatment plan with the patient. The patient was provided an opportunity to ask questions and all were answered. The patient agreed with the plan and demonstrated an understanding of the instructions.  Patient advised to call back or seek an in-person evaluation if the symptoms or condition worsens.  Duration of encounter: 30 minutes.  Note by: Gillis Santa, MD Date: 08/16/2020; Time: 12:09 PM

## 2020-08-16 NOTE — Progress Notes (Signed)
Nursing Pain Medication Assessment:  Safety precautions to be maintained throughout the outpatient stay will include: orient to surroundings, keep bed in low position, maintain call bell within reach at all times, provide assistance with transfer out of bed and ambulation.  Medication Inspection Compliance: Pill count conducted under aseptic conditions, in front of the patient. Neither the pills nor the bottle was removed from the patient's sight at any time. Once count was completed pills were immediately returned to the patient in their original bottle.  Medication: Oxycodone IR Pill/Patch Count: 0 of 60 pills remain Pill/Patch Appearance: Markings consistent with prescribed medication Bottle Appearance: Standard pharmacy container. Clearly labeled. Filled Date: 07/17/2020 Last Medication intake:  Today

## 2020-08-22 ENCOUNTER — Telehealth: Payer: Self-pay | Admitting: Student in an Organized Health Care Education/Training Program

## 2020-08-22 NOTE — Telephone Encounter (Signed)
The last date she was here, Dr. Holley Raring showed her something on the CT scan she wants to know what would have caused the problem that Dr. Holley Raring talked about.

## 2020-08-22 NOTE — Telephone Encounter (Signed)
She needs a virtual appointment to discuss with Dr Holley Raring.

## 2020-08-23 ENCOUNTER — Ambulatory Visit: Payer: Medicare Other | Admitting: Student in an Organized Health Care Education/Training Program

## 2020-08-23 ENCOUNTER — Encounter: Payer: Self-pay | Admitting: Student in an Organized Health Care Education/Training Program

## 2020-08-23 ENCOUNTER — Other Ambulatory Visit: Payer: Self-pay

## 2020-08-23 NOTE — Progress Notes (Signed)
Patient notified that Dr. Holley Raring has written a letter to excuse her from her deposition on 08-24-20. She will come to our office to pick it up later.

## 2020-08-23 NOTE — Telephone Encounter (Signed)
Nurse called patient and verified what she needed. No appt needed. canceled

## 2020-08-29 ENCOUNTER — Telehealth: Payer: Self-pay

## 2020-08-29 NOTE — Telephone Encounter (Signed)
Pt has purchased the cube pillow and she have slept on it for a couple of nights and have relieved her pain. No Pain at all

## 2020-10-19 ENCOUNTER — Other Ambulatory Visit: Payer: Self-pay | Admitting: Family Medicine

## 2020-10-19 DIAGNOSIS — N644 Mastodynia: Secondary | ICD-10-CM

## 2020-10-20 ENCOUNTER — Inpatient Hospital Stay
Admission: RE | Admit: 2020-10-20 | Discharge: 2020-10-20 | Disposition: A | Discharge status: Home / Self Care | Payer: Self-pay | Source: Ambulatory Visit | Attending: *Deleted | Admitting: *Deleted

## 2020-10-20 ENCOUNTER — Other Ambulatory Visit: Payer: Self-pay | Admitting: *Deleted

## 2020-10-20 DIAGNOSIS — Z1231 Encounter for screening mammogram for malignant neoplasm of breast: Secondary | ICD-10-CM

## 2020-10-28 ENCOUNTER — Ambulatory Visit
Admission: RE | Admit: 2020-10-28 | Discharge: 2020-10-28 | Disposition: A | Discharge status: Home / Self Care | Payer: Medicare Other | Source: Ambulatory Visit | Attending: Family Medicine | Admitting: Family Medicine

## 2020-10-28 ENCOUNTER — Other Ambulatory Visit: Payer: Self-pay

## 2020-10-28 DIAGNOSIS — N644 Mastodynia: Secondary | ICD-10-CM

## 2020-11-10 ENCOUNTER — Ambulatory Visit
Payer: Medicare Other | Attending: Student in an Organized Health Care Education/Training Program | Admitting: Student in an Organized Health Care Education/Training Program

## 2020-11-10 ENCOUNTER — Encounter: Payer: Self-pay | Admitting: Student in an Organized Health Care Education/Training Program

## 2020-11-10 ENCOUNTER — Other Ambulatory Visit: Payer: Self-pay

## 2020-11-10 VITALS — BP 133/77 | HR 98 | Temp 97.2°F | Ht 61.0 in | Wt 142.0 lb

## 2020-11-10 DIAGNOSIS — Q761 Klippel-Feil syndrome: Secondary | ICD-10-CM | POA: Diagnosis present

## 2020-11-10 DIAGNOSIS — M47812 Spondylosis without myelopathy or radiculopathy, cervical region: Secondary | ICD-10-CM

## 2020-11-10 DIAGNOSIS — R519 Headache, unspecified: Secondary | ICD-10-CM

## 2020-11-10 DIAGNOSIS — Z9889 Other specified postprocedural states: Secondary | ICD-10-CM

## 2020-11-10 DIAGNOSIS — M7918 Myalgia, other site: Secondary | ICD-10-CM

## 2020-11-10 DIAGNOSIS — G894 Chronic pain syndrome: Secondary | ICD-10-CM | POA: Diagnosis not present

## 2020-11-10 MED ORDER — OXYCODONE HCL 10 MG PO TABS: 10.0000 mg | ORAL_TABLET | Freq: Three times a day (TID) | ORAL | 0 refills | Status: DC | PRN

## 2020-11-10 NOTE — Progress Notes (Signed)
Nursing Pain Medication Assessment:  Safety precautions to be maintained throughout the outpatient stay will include: orient to surroundings, keep bed in low position, maintain call bell within reach at all times, provide assistance with transfer out of bed and ambulation.  Medication Inspection Compliance: Pill count conducted under aseptic conditions, in front of the patient. Neither the pills nor the bottle was removed from the patient's sight at any time. Once count was completed pills were immediately returned to the patient in their original bottle.  Medication: Oxycodone IR Pill/Patch Count: 7 of 60 pills remain Pill/Patch Appearance: Markings consistent with prescribed medication Bottle Appearance: Standard pharmacy container. Clearly labeled. Filled Date: 3 / 74 / 22 Last Medication intake:  TodaySafety precautions to be maintained throughout the outpatient stay will include: orient to surroundings, keep bed in low position, maintain call bell within reach at all times, provide assistance with transfer out of bed and ambulation.

## 2020-11-10 NOTE — Progress Notes (Signed)
PROVIDER NOTE: Information contained herein reflects review and annotations entered in association with encounter. Interpretation of such information and data should be left to medically-trained personnel. Information provided to patient can be located elsewhere in the medical record under "Patient Instructions". Document created using STT-dictation technology, any transcriptional errors that may result from process are unintentional.    Patient: Kimberly [name]  Service Category: E/M  Provider: Gillis Santa, MD  DOB: 26-Apr-1961  DOS: 11/10/2020  Specialty: Interventional Pain Management  MRN: [id number]  Setting: Ambulatory outpatient  PCP: Dion Body, MD  Type: Established Patient    Referring Provider: Dion Body, MD  Location: Office  Delivery: Face-to-face     HPI  Kimberly [name], a 60 y.o. year old female, is here today because of her Cervical spondylosis without myelopathy [M47.812]. [name] primary complain today is Headache Last encounter: My last encounter with her was on 08/22/2020. Pertinent problems: [name] has SHOULDER PAIN; IMPINGEMENT SYNDROME; RUPTURE ROTATOR CUFF; CTS (carpal tunnel syndrome); Cervical spondylosis without myelopathy; Cervical stenosis of spinal canal; Pseudoarthrosis of cervical spine ([place]); Numbness and tingling; Vitamin D deficiency; Cervical fusion syndrome; Hx of cervical spine surgery; Chronic pain syndrome; Cervicalgia; Controlled substance agreement signed; Depression, major, single episode, complete remission ([place]); Cervical myofascial pain syndrome; and Occipital headache on their pertinent problem list. Pain Assessment: Severity of Chronic pain is reported as a 7 /10. Location: Head Left,Right/Denies. Onset: More than a month ago. Quality: Sharp,Stabbing. Timing: Constant. Modifying factor(s): Meds, heat and special pillow. Vitals:  height is '5\' 1"'  (1.549 m) and weight is 142 lb (64.4 kg). Her temperature is 97.2 F (36.2 C)  (abnormal). Her blood pressure is 133/77 and her pulse is 98. Her oxygen saturation is 98%.   Reason for encounter: medication management.    [name] presents today for medication management.  No significant change in her medical history since her last clinic visit except that she fell approximately 2 weeks ago when she tripped over something in her house.  Patient continues to struggle with headaches, neck pain.  I recommended that she get a special pillow at her last clinic visit which she did and she states that it allows her to sleep more comfortably.  She is requesting an increase in her oxycodone for when she has increased pain in the afternoon.  Rather than increasing her medication to every 8 hours as needed, I will increase her monthly quantity from 60-->75 so that she can have an extra 15 tablets/month for when she has an extra pain flare on a given day.  Patient endorsed understanding.  Pharmacotherapy Assessment   Analgesic: Oxycodone 10 mg BID PRN #60/month   Monitoring: [place] PMP: PDMP reviewed during this encounter.       Pharmacotherapy: No side-effects or adverse reactions reported. Compliance: No problems identified. Effectiveness: Clinically acceptable.  Chauncey Fischer, RN  11/10/2020 11:04 AM  Sign when Signing Visit Nursing Pain Medication Assessment:  Safety precautions to be maintained throughout the outpatient stay will include: orient to surroundings, keep bed in low position, maintain call bell within reach at all times, provide assistance with transfer out of bed and ambulation.  Medication Inspection Compliance: Pill count conducted under aseptic conditions, in front of the patient. Neither the pills nor the bottle was removed from the patient's sight at any time. Once count was completed pills were immediately returned to the patient in their original bottle.  Medication: Oxycodone IR Pill/Patch Count: 7 of 60 pills remain Pill/Patch Appearance: Markings  consistent  with prescribed medication Bottle Appearance: Standard pharmacy container. Clearly labeled. Filled Date: 3 / 34 / 22 Last Medication intake:  TodaySafety precautions to be maintained throughout the outpatient stay will include: orient to surroundings, keep bed in low position, maintain call bell within reach at all times, provide assistance with transfer out of bed and ambulation.     UDS:  Summary  Date Value Ref Range Status  12/31/2019 Note  Final    Comment:    ==================================================================== [employer] [facility] (MW) ==================================================================== Test                             Result       Flag       Units Drug Present and Declared for Prescription Verification   Oxycodone                      1345         EXPECTED   ng/mg creat   Oxymorphone                    >2786        EXPECTED   ng/mg creat   Noroxycodone                   2665         EXPECTED   ng/mg creat   Noroxymorphone                 1060         EXPECTED   ng/mg creat    Sources of oxycodone are scheduled prescription medications.    Oxymorphone, noroxycodone, and noroxymorphone are expected    metabolites of oxycodone. Oxymorphone is also available as a    scheduled prescription medication. Drug Absent but Declared for Prescription Verification   Diazepam                       Not Detected UNEXPECTED ng/mg creat   Alprazolam                     Not Detected UNEXPECTED ng/mg creat ==================================================================== Test                      Result    Flag   Units      Ref Range   Creatinine              359              mg/dL      >=20 ==================================================================== Declared Medications:  The flagging and interpretation on this report are based on the  following declared medications.  Unexpected results may arise from  inaccuracies in the declared medications.   **Note: The testing scope of this panel includes these medications:  Alprazolam (Xanax)  Diazepam (Valium)  Oxycodone  **Note: The testing scope of this panel does not include the  following reported medications:  Acetaminophen (Tylenol)  Acyclovir (Zovirax)  Amlodipine (Norvasc)  Amoxicillin (Amoxil)  Cholecalciferol  Cyclobenzaprine (Flexeril)  Diclofenac (Voltaren)  Duloxetine (Cymbalta)  Escitalopram (Lexapro)  Estradiol (Estrace)  Eye Drop  Gabapentin (Neurontin)  Liothyronine (Cytomel)  Meclizine (Antivert)  Methocarbamol (Robaxin)  Nortriptyline (Pamelor)  Nystatin (Mycostatin)  Oxybutynin (Ditropan)  Pantoprazole (Protonix)  Prednisone (Deltasone)  Pregabalin (Lyrica)  Topical  Valacyclovir (Valtrex) ==================================================================== For clinical consultation, please call [phone]. ====================================================================      ROS  Constitutional: Denies any fever or chills Gastrointestinal: No reported hemesis, hematochezia, vomiting, or acute GI distress Musculoskeletal: Denies any acute onset joint swelling, redness, loss of ROM, or weakness Neurological: No reported episodes of acute onset apraxia, aphasia, dysarthria, agnosia, amnesia, paralysis, loss of coordination, or loss of consciousness  Medication Review  ALPRAZolam, Cholecalciferol, DULoxetine, Oxycodone HCl, Tetrahydrozoline HCl, acetaminophen, acyclovir, amLODipine, diazepam, escitalopram, estradiol, gabapentin, liothyronine, meloxicam, methocarbamol, nortriptyline, nystatin, pantoprazole, and rizatriptan  History Review  Allergy: Ms. Burgner is allergic to shellfish allergy, peanut-containing drug products, codeine, diphtheria toxoid, and tetanus toxoids. Drug: Ms. Kessner  reports no history of drug use. Alcohol:  reports no history of alcohol use. Tobacco:  reports that she has never smoked. She has never used smokeless  tobacco. Social: Ms. Casebeer  reports that she has never smoked. She has never used smokeless tobacco. She reports that she does not drink alcohol and does not use drugs. Medical:  has a past medical history of Anxiety, Arthritis, Asthma, Balance problem, Brain cyst, Depression, Difficult intubation, Dyspnea, Flu (08/10/2018), GERD (gastroesophageal reflux disease), Headache, Herpes, History of kidney stones, History of pneumonia, Hypertension, Inflammation of shoulder joint, Memory difficulties, Neuromuscular disorder ([place]), Pneumothorax on right, PONV (postoperative nausea and vomiting), Recurrent falls, and Urinary frequency. Surgical: Ms. Heidt  has a past surgical history that includes Abdominal hysterectomy; Rotator cuff repair (Left); Foreign Body Removal (Left); Carpal tunnel release (Left, 02/16/2013); Carpal tunnel release (Right, 03/27/2013); Cesarean section; Anterior cervical decomp/discectomy fusion (N/A, 08/06/2014); Colonoscopy; Nasal sinus surgery (N/A, 04/13/2015); Shoulder arthroscopy with distal clavicle resection (Left, 05/18/2015); Shoulder acromioplasty (Left, 05/18/2015); Cholecystectomy (N/A, 06/17/2015); Neck surgery (2016); Anterior cervical decomp/discectomy fusion (N/A, 07/04/2016); Posterior cervical fusion/foraminotomy (N/A, 11/13/2016); Knee arthroscopy; Extracorporeal shock wave lithotripsy (Left, 09/04/2018); Colonoscopy with propofol (N/A, 05/06/2019); and Reduction mammaplasty. Family: family history includes Hypertension in her father and another family member.  Laboratory Chemistry Profile   Renal Lab Results  Component Value Date   BUN 9 07/01/2019   CREATININE 0.86 07/01/2019   BCR 10 07/01/2019   GFRAA 86 07/01/2019   GFRNONAA 75 07/01/2019     Hepatic Lab Results  Component Value Date   AST 26 07/31/2015   ALT 17 07/31/2015   ALBUMIN 4.1 07/31/2015   ALKPHOS 93 07/31/2015   LIPASE 23 07/15/2017     Electrolytes Lab Results  Component Value Date   NA 144  07/01/2019   K 4.8 07/01/2019   CL 103 07/01/2019   CALCIUM 10.3 (H) 07/01/2019     Bone No results found for: VD25OH, VD125OH2TOT, DU2025KY7, CW2376EG3, 25OHVITD1, 25OHVITD2, 25OHVITD3, TESTOFREE, TESTOSTERONE   Inflammation (CRP: Acute Phase) (ESR: Chronic Phase) No results found for: CRP, ESRSEDRATE, LATICACIDVEN     Note: Above Lab results reviewed.  Recent Imaging Review  MM DIAG BREAST TOMO BILATERAL CLINICAL DATA:  60 year old female presenting for evaluation of bilateral diffuse lateral breast pain. The patient has been on estradiol long term. She had a reduction mammoplasty just after her mammogram in March of 2021. She has also had 3 surgeries on her neck.  EXAM: DIGITAL DIAGNOSTIC BILATERAL MAMMOGRAM WITH TOMOSYNTHESIS AND CAD  TECHNIQUE: Bilateral digital diagnostic mammography and breast tomosynthesis was performed. The images were evaluated with computer-aided detection.  COMPARISON:  Previous exam(s).  ACR Breast Density Category b: There are scattered areas of fibroglandular density.  FINDINGS: No suspicious calcifications, masses or areas of distortion are seen in the bilateral breasts. Expected surgical changes consistent with history of bilateral  reduction mammoplasty is noted.  IMPRESSION: 1. No suspicious findings in either breast to correspond with the patient's concern of bilateral breast pain.  2.  No mammographic evidence of malignancy in the bilateral breasts.  RECOMMENDATION: 1. Breast pain is a common condition, which will often resolve on its own without intervention. It can be affected by hormonal changes, medication side effect, weight changes and fit of the bra. Pain may also be referred from other adjacent areas of the body. Breast pain may be improved by wearing adequate well-fitting support, over-the-counter topical and oral NSAID medication, low-fat diet, and ice/heat as needed. Studies have shown an improvement in cyclic pain  with use of evening primrose oil and vitamin E. Clinical follow-up recommended to discuss any further work-up recommendations and appropriate treatment.  2.  Screening mammogram in one year.(Code:SM-B-01Y)  I have discussed the findings and recommendations with the patient. If applicable, a reminder letter will be sent to the patient regarding the next appointment.  BI-RADS CATEGORY  2: Benign.  Electronically Signed   By: Ammie Ferrier M.D.   On: 10/28/2020 11:30 Note: Reviewed        Physical Exam  General appearance: Well nourished, well developed, and well hydrated. In no apparent acute distress Mental status: Alert, oriented x 3 (person, place, & time)       Respiratory: No evidence of acute respiratory distress Eyes: PERLA Vitals: BP 133/77   Pulse 98   Temp (!) 97.2 F (36.2 C)   Ht '5\' 1"'  (1.549 m)   Wt 142 lb (64.4 kg)   SpO2 98%   BMI 26.83 kg/m  BMI: Estimated body mass index is 26.83 kg/m as calculated from the following:   Height as of this encounter: '5\' 1"'  (1.549 m).   Weight as of this encounter: 142 lb (64.4 kg). Ideal: Ideal body weight: 47.8 kg (105 lb 6.1 oz) Adjusted ideal body weight: 54.4 kg (120 lb 0.4 oz)  Cervical Spine Exam  Skin & Axial Inspection:Well healed scar from previous spine surgery detected Alignment:Symmetrical Functional JWL:KHVF restricted ROM Stability:No instability detected Muscle Tone/Strength:Functionally intact. No obvious neuro-muscular anomalies detected. Sensory (Neurological):Neurogenic pain pattern Palpation:No palpable anomalies        Upper Extremity (UE) Exam    Side:Right upper extremity  Side:Left upper extremity   Skin & Extremity Inspection:Skin color, temperature, and hair growth are WNL. No peripheral edema or cyanosis. No masses, redness, swelling, asymmetry, or associated skin lesions. No contractures.  Skin & Extremity Inspection:Skin color, temperature, and hair  growth are WNL. No peripheral edema or cyanosis. No masses, redness, swelling, asymmetry, or associated skin lesions. No contractures.   Functional MBB:[id number] ROM  Functional FMM:CRFVOHKGOVPC ROM   Muscle Tone/Strength:Functionally intact. No obvious neuro-muscular anomalies detected.  Muscle Tone/Strength:Functionally intact. No obvious neuro-muscular anomalies detected.   Sensory (Neurological):Unimpaired  Sensory (Neurological):Unimpaired   Palpation:No palpable anomalies  Palpation:No palpable anomalies   Provocative Test(s): Phalen's test:deferred Tinel's test:deferred Apley's scratch test (touch opposite shoulder): Action 1 (Across chest):deferred Action 2 (Overhead):deferred Action 3 (LB reach):deferred   Provocative Test(s): Phalen's test:deferred Tinel's test:deferred Apley's scratch test (touch opposite shoulder): Action 1 (Across chest):deferred Action 2 (Overhead):deferred Action 3 (LB reach):deferred     Assessment   Status Diagnosis  Controlled Controlled Worsening 1. Cervical spondylosis without myelopathy   2. Cervical fusion syndrome (C2-T1)   3. Chronic pain syndrome   4. Hx of cervical spine surgery   5. Cervical myofascial pain syndrome   6. Occipital headache  Plan of Care   Ms. JODETTE WIK has a current medication list which includes the following long-term medication(s): escitalopram, liothyronine, nortriptyline, [START ON 11/14/2020] oxycodone hcl, [START ON 12/14/2020] oxycodone hcl, [START ON 01/13/2021] oxycodone hcl, and rizatriptan.  Pharmacotherapy (Medications Ordered): Meds ordered this encounter  Medications  . Oxycodone HCl 10 MG TABS    Sig: Take 1 tablet (10 mg total) by mouth every 8 (eight) hours as needed. Must last 30 days.    Dispense:  75 tablet    Refill:  0    Chronic Pain. (STOP Act - Not applicable). Fill one day early if  closed on scheduled refill date.  . Oxycodone HCl 10 MG TABS    Sig: Take 1 tablet (10 mg total) by mouth every 8 (eight) hours as needed. Must last 30 days.    Dispense:  75 tablet    Refill:  0    Chronic Pain. (STOP Act - Not applicable). Fill one day early if closed on scheduled refill date.  . Oxycodone HCl 10 MG TABS    Sig: Take 1 tablet (10 mg total) by mouth every 8 (eight) hours as needed. Must last 30 days.    Dispense:  75 tablet    Refill:  0    Chronic Pain. (STOP Act - Not applicable). Fill one day early if closed on scheduled refill date.   Follow-up plan:   Return in about 3 months (around 02/09/2021) for Medication Management, in person.     cervical TPI, cervical facet medial branch nerve block, suprascapular nerve block, bilateral C4, C5, C6   cervical facet medial branch nerve block 07/04/2020: Not effective, do not repeat.        Recent Visits Date Type Provider Dept  08/16/20 Office Visit Gillis Santa, MD [facility]  Showing recent visits within past 90 days and meeting all other requirements Today's Visits Date Type Provider Dept  11/10/20 Office Visit Gillis Santa, MD [facility]  Showing today's visits and meeting all other requirements Future Appointments Date Type Provider Dept  01/24/21 Appointment Gillis Santa, MD [facility]  Showing future appointments within next 90 days and meeting all other requirements  I discussed the assessment and treatment plan with the patient. The patient was provided an opportunity to ask questions and all were answered. The patient agreed with the plan and demonstrated an understanding of the instructions.  Patient advised to call back or seek an in-person evaluation if the symptoms or condition worsens.  Duration of encounter: 30 minutes.  Note by: Gillis Santa, MD Date: 11/10/2020; Time: 11:51 AM

## 2020-12-23 IMAGING — CR DG ABDOMEN 1V
1 series · 1 of 1 positions shown · non-contrast
Comparison: CT, 08/04/2018

CLINICAL DATA: Kidney stones 4 months ago. Evaluate for recurrence.
No current symptoms.

EXAM:
ABDOMEN - 1 VIEW

[abdomen kub]
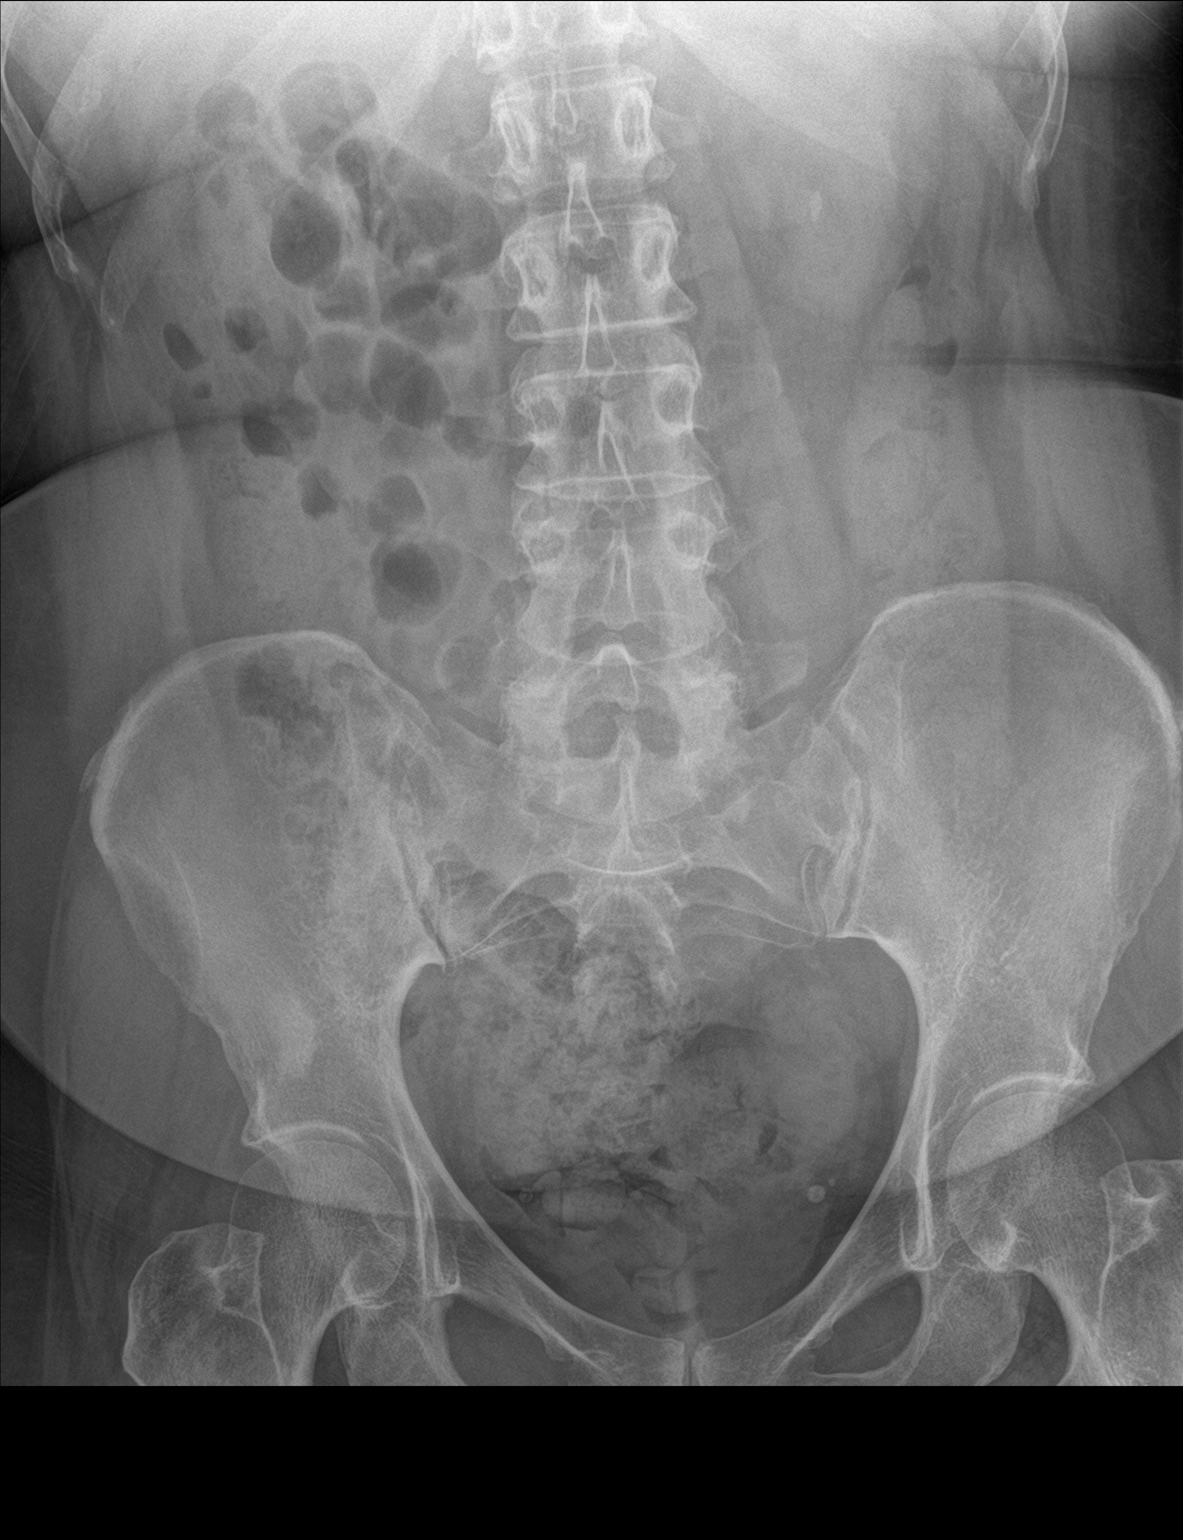

[1 of 1 positions shown; findings below may reference images not displayed]

FINDINGS: Single elongated stone projects in the lower pole of the left kidney
measuring 7 mm in size, stable from the prior CT. No other
visualized intrarenal stones. No evidence of a ureteral stone. Two
small phleboliths noted in the left lower pelvis.

Soft tissues are otherwise unremarkable. Normal bowel gas pattern.
No significant skeletal abnormality.
IMPRESSION: 1. 7 mm stone in the lower pole the left kidney stable from the
prior CT.
2. No evidence of a ureteral stone.

## 2020-12-26 IMAGING — CR DG CHEST 2V
1 series · 2 of 2 positions shown · non-contrast
Comparison: 11/27/2017

CLINICAL DATA: Cough and shortness of breath for 2 days. Personal
history of asthma and right pneumothorax.

EXAM:
CHEST - 2 VIEW

[Series 1: w chest pa · 0.14mm/px · 2 of 2 slices shown]
[im 1/2]
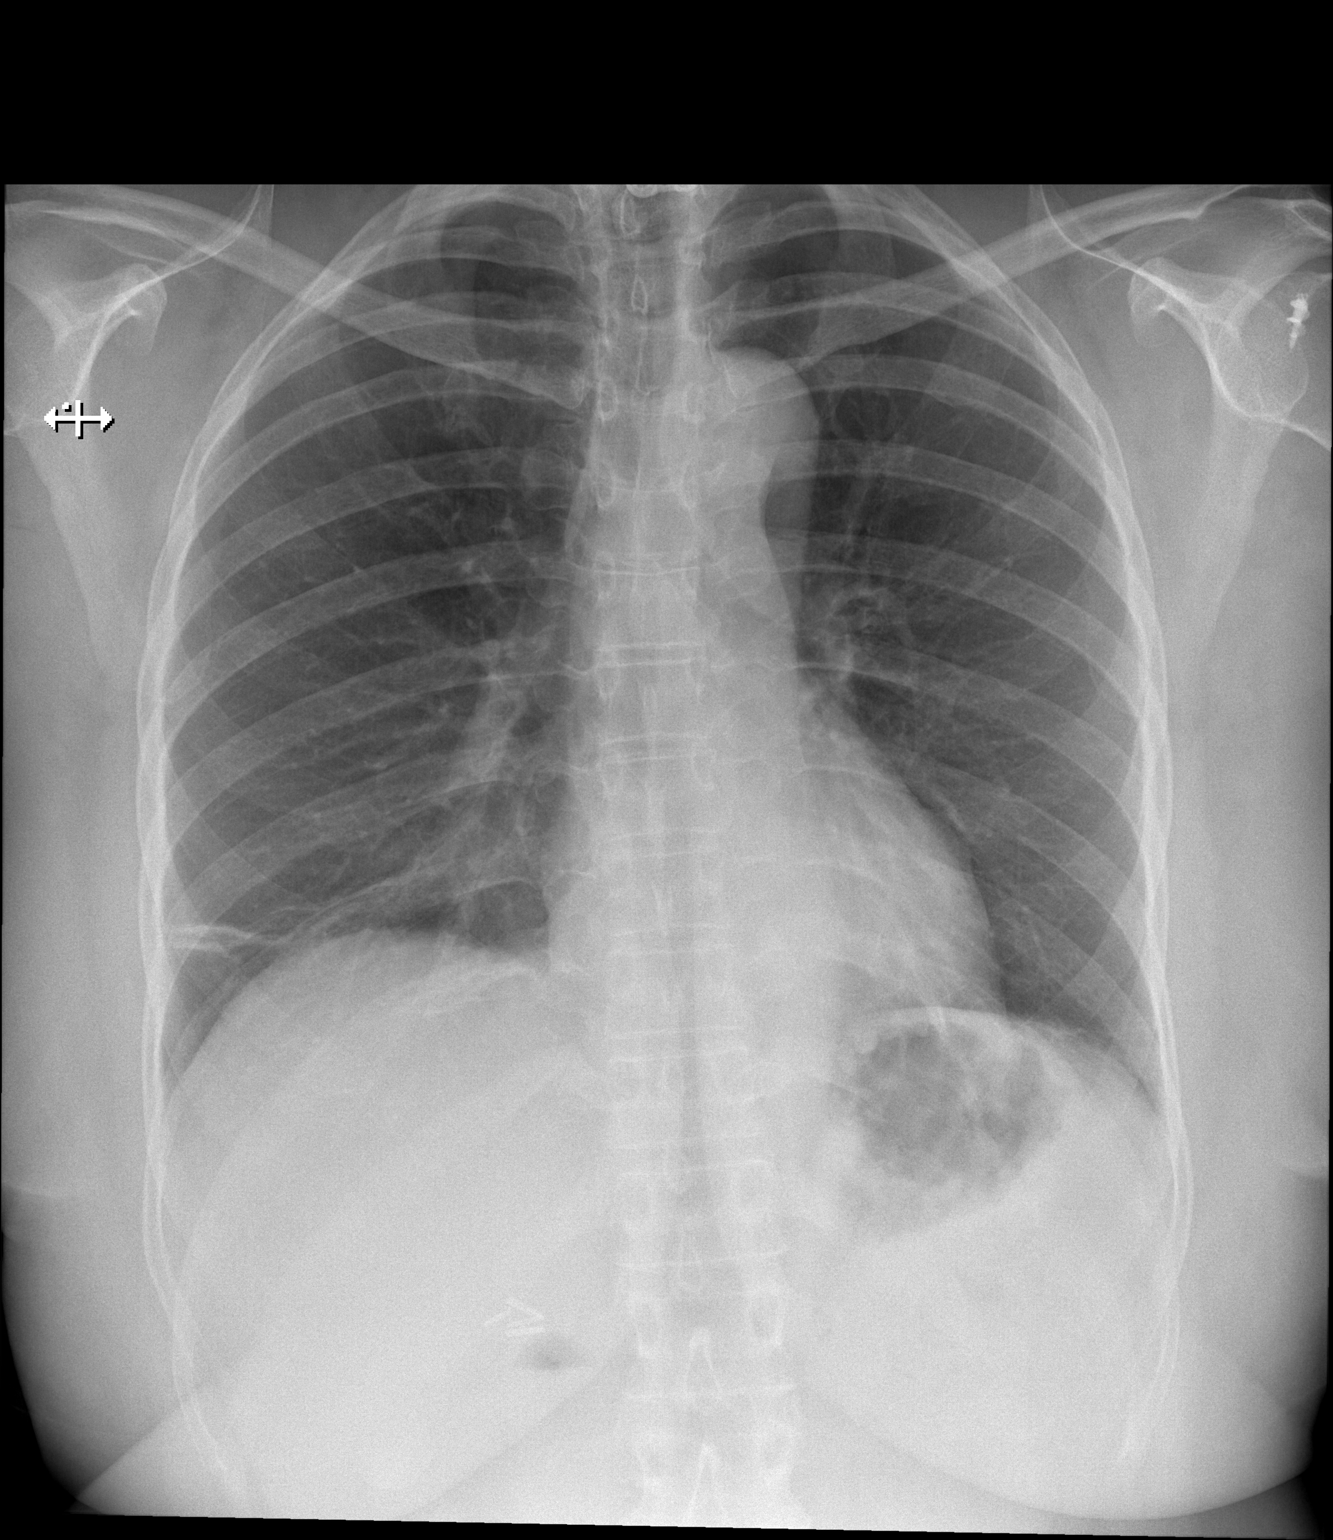
[im 2/2]
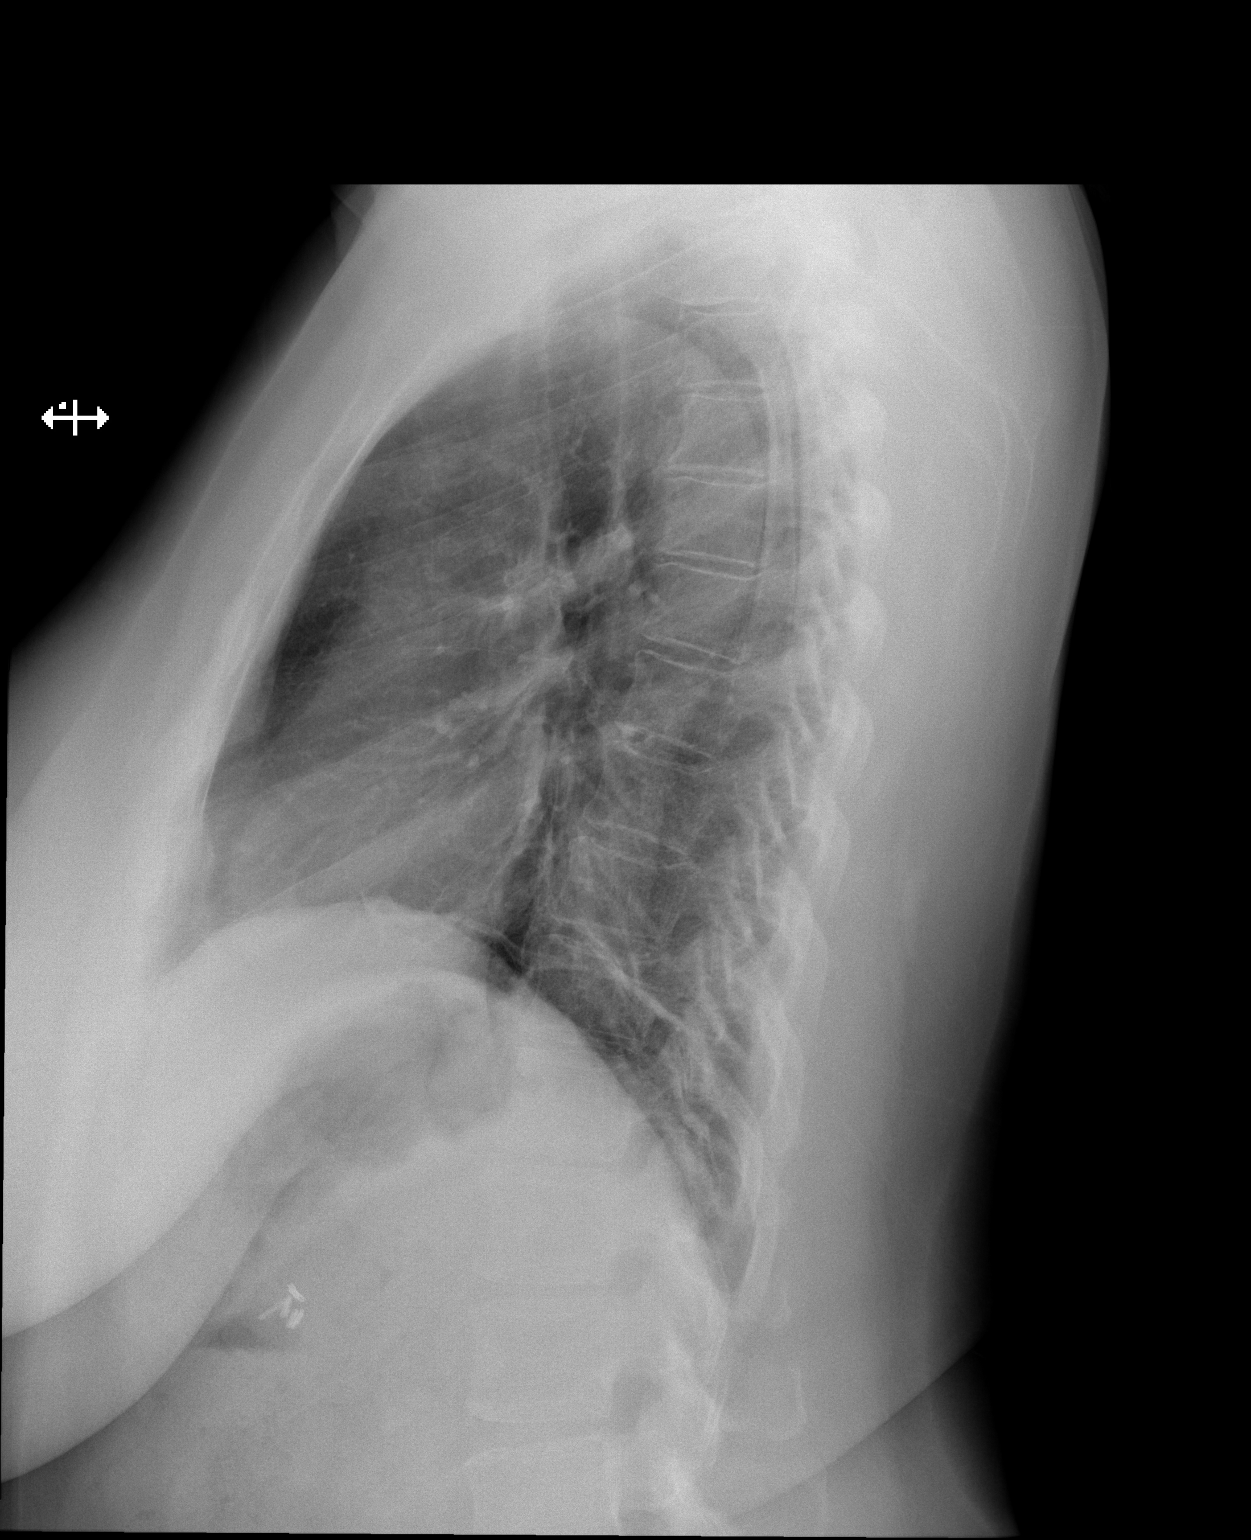

[2 of 2 positions shown; findings below may reference images not displayed]

FINDINGS: The heart size and mediastinal contours are within normal limits.
Mild scarring in the right lower lobe is again noted. No evidence of
pulmonary infiltrate or edema. No evidence of pneumothorax or
pleural effusion.
IMPRESSION: No active cardiopulmonary disease.

## 2021-01-20 IMAGING — CR DG ABDOMEN 1V
2 series · 2 of 2 positions shown · non-contrast
Comparison: 08/07/2018

CLINICAL DATA: Left renal calculus pre lithotripsy

EXAM:
ABDOMEN - 1 VIEW

[abdomen kub (1 of 2)]
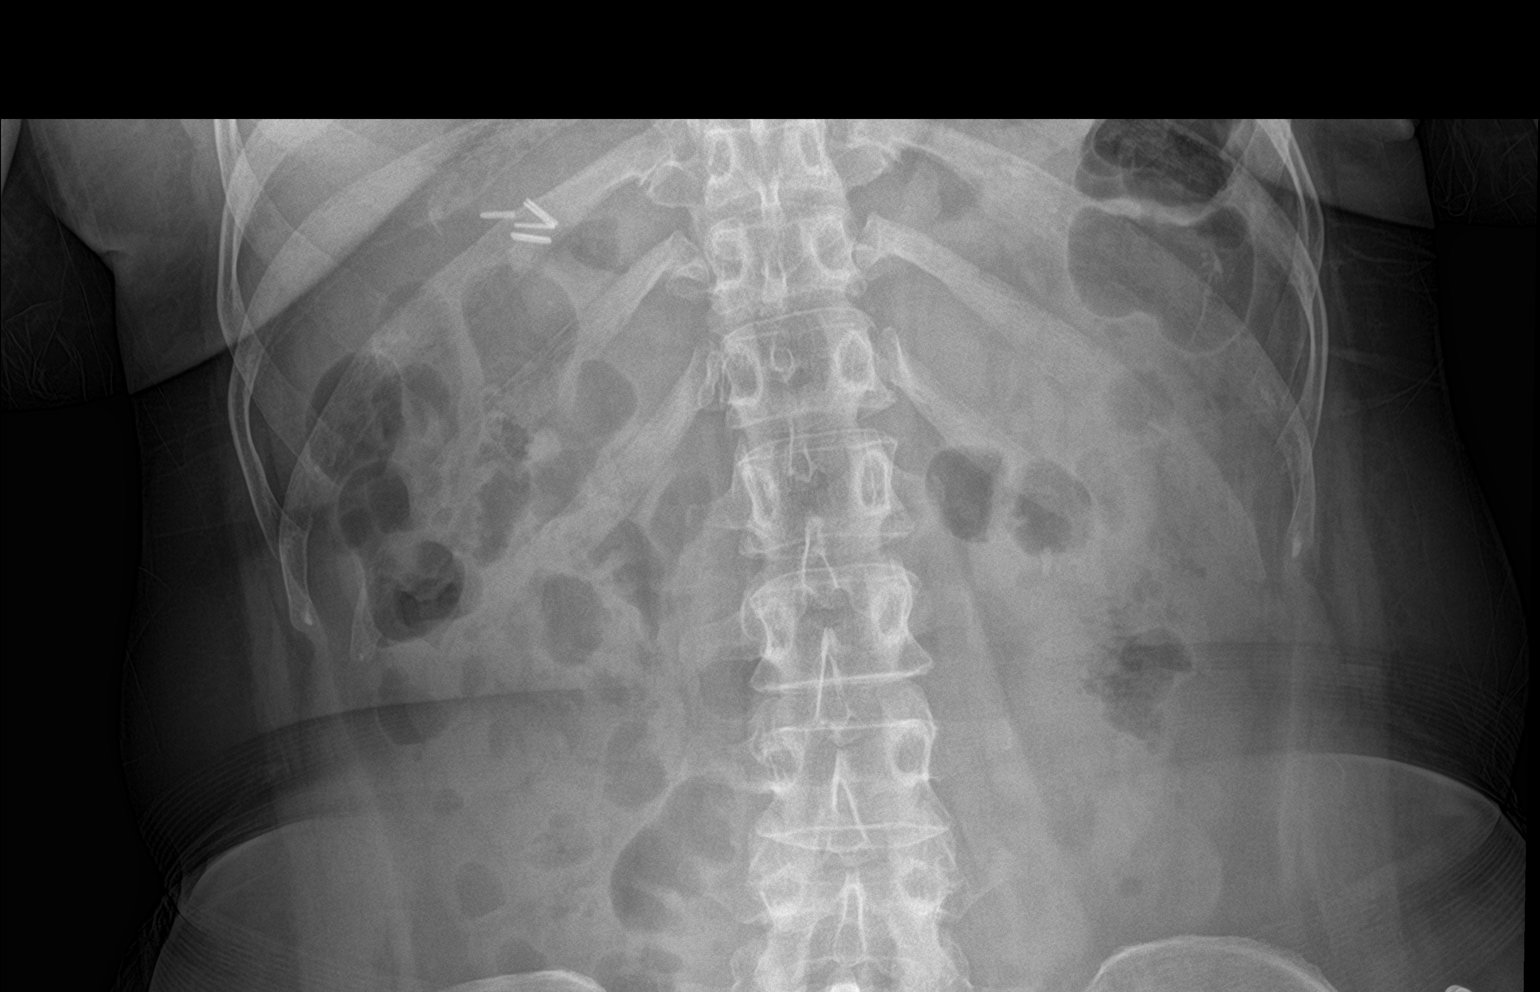

[abdomen kub (2 of 2)]
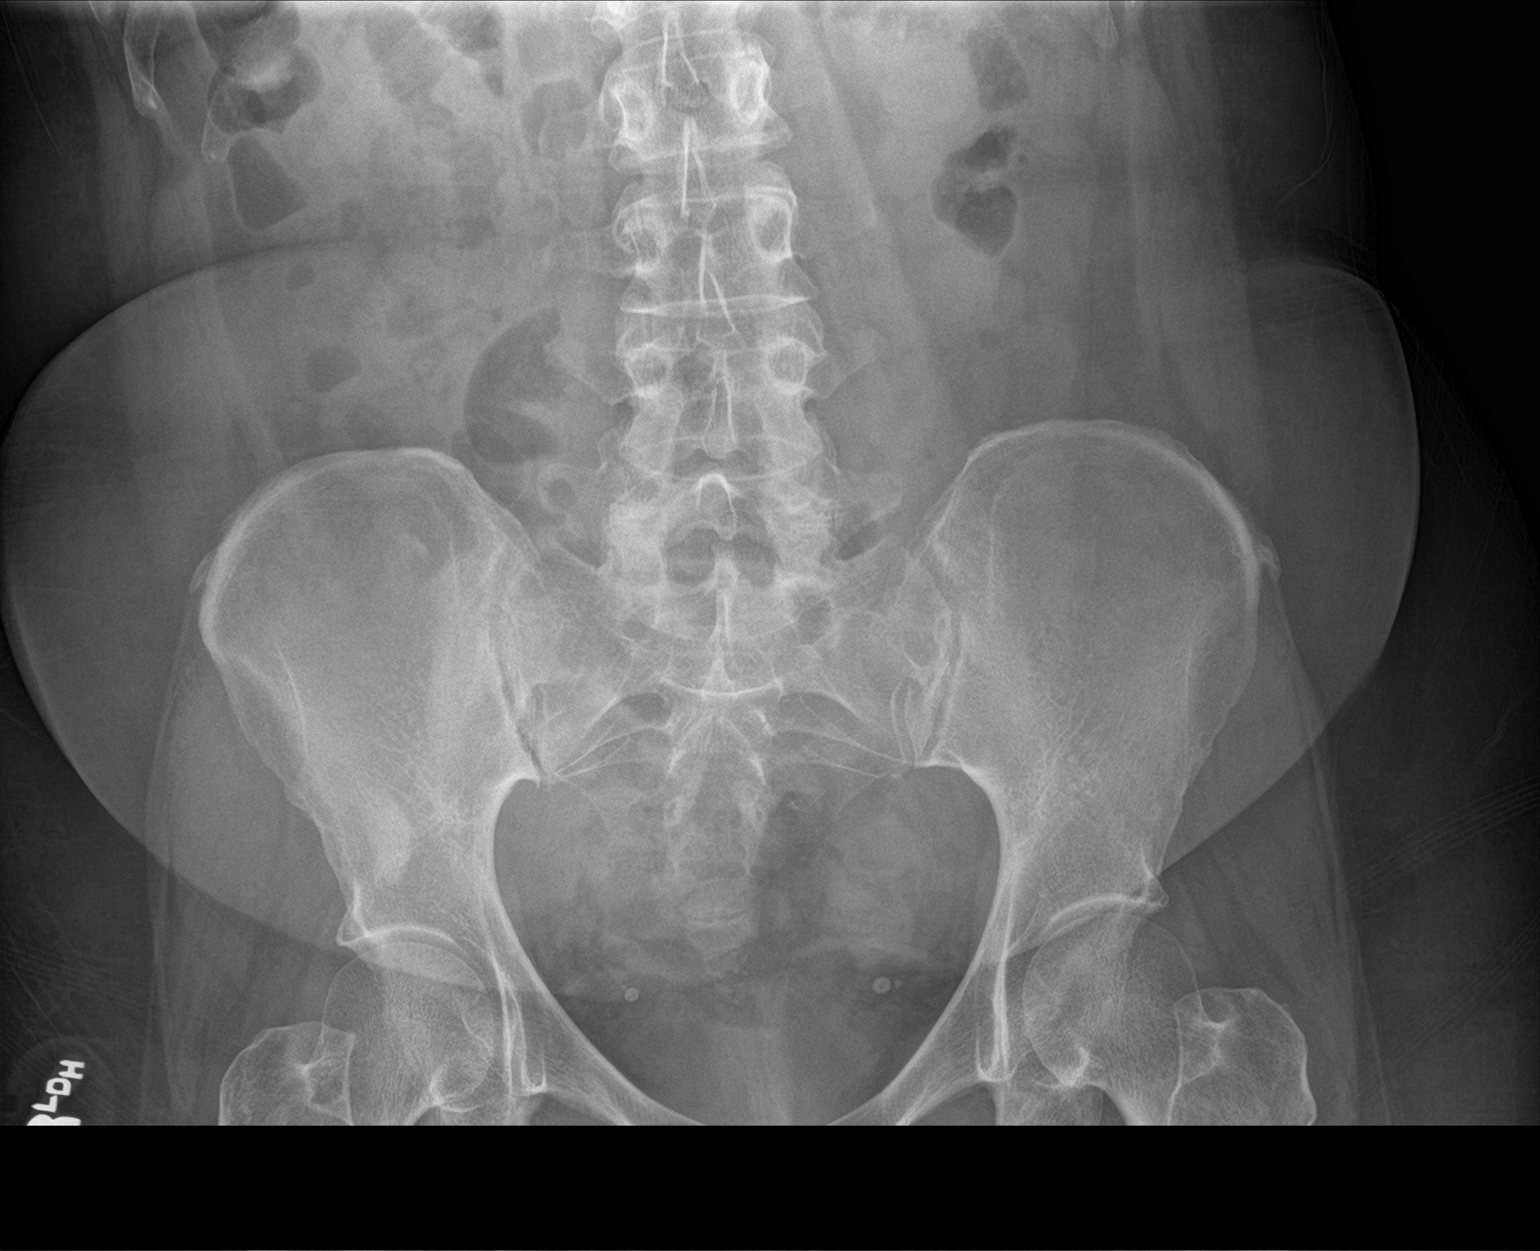

[2 of 2 positions shown; findings below may reference images not displayed]

FINDINGS: 3 x 6 mm left renal calculus overlying the lower pole is unchanged.
No other renal calculi

Phleboliths in the pelvis. Normal bowel gas pattern. Surgical clips
in the gallbladder fossa.
IMPRESSION: 3 x 6 mm calculus overlying the left lower pole unchanged.

## 2021-01-24 ENCOUNTER — Ambulatory Visit
Payer: Medicare Other | Attending: Student in an Organized Health Care Education/Training Program | Admitting: Student in an Organized Health Care Education/Training Program

## 2021-01-24 ENCOUNTER — Encounter: Payer: Self-pay | Admitting: Student in an Organized Health Care Education/Training Program

## 2021-01-24 ENCOUNTER — Other Ambulatory Visit: Payer: Self-pay

## 2021-01-24 VITALS — BP 123/91 | HR 91 | Temp 97.2°F | Resp 16 | Ht 61.0 in | Wt 143.0 lb

## 2021-01-24 DIAGNOSIS — G894 Chronic pain syndrome: Secondary | ICD-10-CM | POA: Diagnosis present

## 2021-01-24 DIAGNOSIS — Q761 Klippel-Feil syndrome: Secondary | ICD-10-CM

## 2021-01-24 DIAGNOSIS — M47812 Spondylosis without myelopathy or radiculopathy, cervical region: Secondary | ICD-10-CM | POA: Insufficient documentation

## 2021-01-24 MED ORDER — OXYCODONE HCL 10 MG PO TABS: 10.0000 mg | ORAL_TABLET | Freq: Three times a day (TID) | ORAL | 0 refills | Status: DC | PRN

## 2021-01-24 NOTE — Progress Notes (Signed)
PROVIDER NOTE: Information contained herein reflects review and annotations entered in association with encounter. Interpretation of such information and data should be left to medically-trained personnel. Information provided to patient can be located elsewhere in the medical record under "Patient Instructions". Document created using STT-dictation technology, any transcriptional errors that may result from process are unintentional.    Patient: Kimberly [name]  Service Category: E/M  Provider: Gillis Santa, Kimberly [name]  DOB: 1960-10-11  DOS: 01/24/2021  Specialty: Interventional Pain Management  MRN: [id number]  Setting: Ambulatory outpatient  PCP: Dion Body, Kimberly [name]  Type: Established Patient    Referring Provider: Dion Body, Kimberly [name]  Location: Office  Delivery: Face-to-face     HPI  Kimberly [name], a 60 y.o. year old female, is here today because of her Cervical spondylosis without myelopathy [M47.812]. Kimberly [name] primary complain today is Headache (Posterior/center) Last encounter: My last encounter with her was on 11/10/20 Pertinent problems: Kimberly [name] has SHOULDER PAIN; IMPINGEMENT SYNDROME; RUPTURE ROTATOR CUFF; CTS (carpal tunnel syndrome); Cervical spondylosis without myelopathy; Cervical stenosis of spinal canal; Pseudoarthrosis of cervical spine ([place]); Numbness and tingling; Vitamin D deficiency; Cervical fusion syndrome; Hx of cervical spine surgery; Chronic pain syndrome; Cervicalgia; Controlled substance agreement signed; Depression, major, single episode, complete remission ([place]); Cervical myofascial pain syndrome; and Occipital headache on their pertinent problem list. Pain Assessment: Severity of Chronic pain is reported as a 4 /10. Location: Head Posterior, Mid/denies. Onset: More than a month ago. Quality: Constant, Aching. Timing: Constant. Modifying factor(s): meds. Vitals:  height is _0  (1.549 m) and weight is 143 lb (64.9 kg). Her temporal temperature is 97.2 F (36.2  C) (abnormal). Her blood pressure is 123/91 (abnormal) and her pulse is 91. Her respiration is 16 and oxygen saturation is 100%.   Reason for encounter: medication management.    Kimberly [name] presents today for medication management.  No change in medical history since last visit.  Patient's pain is at baseline.  Patient continues multimodal pain regimen as prescribed.  States that it provides pain relief and improvement in functional status.   Pharmacotherapy Assessment   Analgesic: Oxycodone 10 mg BID-TID PRN #75/month   Monitoring: [place] PMP: PDMP reviewed during this encounter.       Pharmacotherapy: No side-effects or adverse reactions reported. Compliance: No problems identified. Effectiveness: Clinically acceptable.  Kimberly Patience, Kimberly [name]  01/24/2021 10:44 AM  Sign when Signing Visit Nursing Pain Medication Assessment:  Safety precautions to be maintained throughout the outpatient stay will include: orient to surroundings, keep bed in low position, maintain call bell within reach at all times, provide assistance with transfer out of bed and ambulation.  Medication Inspection Compliance: Pill count conducted under aseptic conditions, in front of the patient. Neither the pills nor the bottle was removed from the patient's sight at any time. Once count was completed pills were immediately returned to the patient in their original bottle.  Medication:  oxycodone Pill/Patch Count:  53 of 75 pills remain Pill/Patch Appearance: Markings consistent with prescribed medication Bottle Appearance: Standard pharmacy container. Clearly labeled. Filled Date: 06 / 18 / 2022 Last Medication intake:  Today      UDS:  Summary  Date Value Ref Range Status  12/31/2019 Note  Final    Comment:    ==================================================================== [employer] [facility] (MW) ==================================================================== Test                             Result  Flag        Units Drug Present and Declared for Prescription Verification   Oxycodone                      1345         EXPECTED   ng/mg creat   Oxymorphone                    >2786        EXPECTED   ng/mg creat   Noroxycodone                   2665         EXPECTED   ng/mg creat   Noroxymorphone                 1060         EXPECTED   ng/mg creat    Sources of oxycodone are scheduled prescription medications.    Oxymorphone, noroxycodone, and noroxymorphone are expected    metabolites of oxycodone. Oxymorphone is also available as a    scheduled prescription medication. Drug Absent but Declared for Prescription Verification   Diazepam                       Not Detected UNEXPECTED ng/mg creat   Alprazolam                     Not Detected UNEXPECTED ng/mg creat ==================================================================== Test                      Result    Flag   Units      Ref Range   Creatinine              359              mg/dL      >=20 ==================================================================== Declared Medications:  The flagging and interpretation on this report are based on the  following declared medications.  Unexpected results may arise from  inaccuracies in the declared medications.  **Note: The testing scope of this panel includes these medications:  Alprazolam (Xanax)  Diazepam (Valium)  Oxycodone  **Note: The testing scope of this panel does not include the  following reported medications:  Acetaminophen (Tylenol)  Acyclovir (Zovirax)  Amlodipine (Norvasc)  Amoxicillin (Amoxil)  Cholecalciferol  Cyclobenzaprine (Flexeril)  Diclofenac (Voltaren)  Duloxetine (Cymbalta)  Escitalopram (Lexapro)  Estradiol (Estrace)  Eye Drop  Gabapentin (Neurontin)  Liothyronine (Cytomel)  Meclizine (Antivert)  Methocarbamol (Robaxin)  Nortriptyline (Pamelor)  Nystatin (Mycostatin)  Oxybutynin (Ditropan)  Pantoprazole (Protonix)  Prednisone (Deltasone)  Pregabalin  (Lyrica)  Topical  Valacyclovir (Valtrex) ==================================================================== For clinical consultation, please call [phone]. ====================================================================      ROS  Constitutional: Denies any fever or chills +headaches, occipital dominant Gastrointestinal: No reported hemesis, hematochezia, vomiting, or acute GI distress Musculoskeletal: Denies any acute onset joint swelling, redness, loss of ROM, or weakness Neurological: No reported episodes of acute onset apraxia, aphasia, dysarthria, agnosia, amnesia, paralysis, loss of coordination, or loss of consciousness  Medication Review  ALPRAZolam, Cholecalciferol, DULoxetine, Oxycodone HCl, Tetrahydrozoline HCl, acetaminophen, acyclovir, amLODipine, diazepam, escitalopram, estradiol, gabapentin, liothyronine, meloxicam, methocarbamol, nortriptyline, nystatin, pantoprazole, and rizatriptan  History Review  Allergy: Kimberly [name] is allergic to shellfish allergy, peanut-containing drug products, codeine, diphtheria toxoid, and tetanus toxoids. Drug: Kimberly [name]  reports no history of drug use. Alcohol:  reports no history of alcohol  use. Tobacco:  reports that she has never smoked. She has never used smokeless tobacco. Social: Kimberly [name]  reports that she has never smoked. She has never used smokeless tobacco. She reports that she does not drink alcohol and does not use drugs. Medical:  has a past medical history of Anxiety, Arthritis, Asthma, Balance problem, Brain cyst, Depression, Difficult intubation, Dyspnea, Flu (08/10/2018), GERD (gastroesophageal reflux disease), Headache, Herpes, History of kidney stones, History of pneumonia, Hypertension, Inflammation of shoulder joint, Memory difficulties, Neuromuscular disorder ([place]), Pneumothorax on right, PONV (postoperative nausea and vomiting), Recurrent falls, and Urinary frequency. Surgical: Kimberly [name]  has a past  surgical history that includes Abdominal hysterectomy; Rotator cuff repair (Left); Foreign Body Removal (Left); Carpal tunnel release (Left, 02/16/2013); Carpal tunnel release (Right, 03/27/2013); Cesarean section; Anterior cervical decomp/discectomy fusion (N/A, 08/06/2014); Colonoscopy; Nasal sinus surgery (N/A, 04/13/2015); Shoulder arthroscopy with distal clavicle resection (Left, 05/18/2015); Shoulder acromioplasty (Left, 05/18/2015); Cholecystectomy (N/A, 06/17/2015); Neck surgery (2016); Anterior cervical decomp/discectomy fusion (N/A, 07/04/2016); Posterior cervical fusion/foraminotomy (N/A, 11/13/2016); Knee arthroscopy; Extracorporeal shock wave lithotripsy (Left, 09/04/2018); Colonoscopy with propofol (N/A, 05/06/2019); and Reduction mammaplasty. Family: family history includes Hypertension in her father and another family member.  Laboratory Chemistry Profile   Renal Lab Results  Component Value Date   BUN 9 07/01/2019   CREATININE 0.86 07/01/2019   BCR 10 07/01/2019   GFRAA 86 07/01/2019   GFRNONAA 75 07/01/2019     Hepatic Lab Results  Component Value Date   AST 26 07/31/2015   ALT 17 07/31/2015   ALBUMIN 4.1 07/31/2015   ALKPHOS 93 07/31/2015   LIPASE 23 07/15/2017     Electrolytes Lab Results  Component Value Date   NA 144 07/01/2019   K 4.8 07/01/2019   CL 103 07/01/2019   CALCIUM 10.3 (H) 07/01/2019     Bone No results found for: VD25OH, VD125OH2TOT, XH3716RC7, EL3810FB5, 25OHVITD1, 25OHVITD2, 25OHVITD3, TESTOFREE, TESTOSTERONE   Inflammation (CRP: Acute Phase) (ESR: Chronic Phase) No results found for: CRP, ESRSEDRATE, LATICACIDVEN     Note: Above Lab results reviewed.  Recent Imaging Review  MM DIAG BREAST TOMO BILATERAL CLINICAL DATA:  60 year old female presenting for evaluation of bilateral diffuse lateral breast pain. The patient has been on estradiol long term. She had a reduction mammoplasty just after her mammogram in March of 2021. She has also had 3  surgeries on her neck.  EXAM: DIGITAL DIAGNOSTIC BILATERAL MAMMOGRAM WITH TOMOSYNTHESIS AND CAD  TECHNIQUE: Bilateral digital diagnostic mammography and breast tomosynthesis was performed. The images were evaluated with computer-aided detection.  COMPARISON:  Previous exam(s).  ACR Breast Density Category b: There are scattered areas of fibroglandular density.  FINDINGS: No suspicious calcifications, masses or areas of distortion are seen in the bilateral breasts. Expected surgical changes consistent with history of bilateral reduction mammoplasty is noted.  IMPRESSION: 1. No suspicious findings in either breast to correspond with the patient's concern of bilateral breast pain.  2.  No mammographic evidence of malignancy in the bilateral breasts.  RECOMMENDATION: 1. Breast pain is a common condition, which will often resolve on its own without intervention. It can be affected by hormonal changes, medication side effect, weight changes and fit of the bra. Pain may also be referred from other adjacent areas of the body. Breast pain may be improved by wearing adequate well-fitting support, over-the-counter topical and oral NSAID medication, low-fat diet, and ice/heat as needed. Studies have shown an improvement in cyclic pain with use of evening primrose oil and vitamin E. Clinical  follow-up recommended to discuss any further work-up recommendations and appropriate treatment.  2.  Screening mammogram in one year.(Code:[id number]-B-01Y)  I have discussed the findings and recommendations with the patient. If applicable, a reminder letter will be sent to the patient regarding the next appointment.  BI-RADS CATEGORY  2: Benign.  Electronically Signed   By: Kimberly Ferrier M.D.   On: 10/28/2020 11:30 Note: Reviewed        Physical Exam  General appearance: Well nourished, well developed, and well hydrated. In no apparent acute distress Mental status: Alert, oriented x 3  (person, place, & time)       Respiratory: No evidence of acute respiratory distress Eyes: PERLA Vitals: BP (!) 123/91 (BP Location: Right Arm, Patient Position: Sitting, Cuff Size: Normal)   Pulse 91   Temp (!) 97.2 F (36.2 C) (Temporal)   Resp 16   Ht _0  (1.549 m)   Wt 143 lb (64.9 kg)   SpO2 100%   BMI 27.02 kg/m  BMI: Estimated body mass index is 27.02 kg/m as calculated from the following:   Height as of this encounter: _1  (1.549 m).   Weight as of this encounter: 143 lb (64.9 kg). Ideal: Ideal body weight: 47.8 kg (105 lb 6.1 oz) Adjusted ideal body weight: 54.6 kg (120 lb 6.8 oz)  Cervical Spine Exam  Skin & Axial Inspection: Well healed scar from previous spine surgery detected Alignment: Symmetrical Functional ROM: Pain restricted ROM      Stability: No instability detected Muscle Tone/Strength: Functionally intact. No obvious neuro-muscular anomalies detected. Sensory (Neurological): Neurogenic pain pattern Palpation: No palpable anomalies                          Upper Extremity (UE) Exam      Side: Right upper extremity   Side: Left upper extremity    Skin & Extremity Inspection: Skin color, temperature, and hair growth are WNL. No peripheral edema or cyanosis. No masses, redness, swelling, asymmetry, or associated skin lesions. No contractures.   Skin & Extremity Inspection: Skin color, temperature, and hair growth are WNL. No peripheral edema or cyanosis. No masses, redness, swelling, asymmetry, or associated skin lesions. No contractures.    Functional ROM: Unrestricted ROM           Functional ROM: Unrestricted ROM            Muscle Tone/Strength: Functionally intact. No obvious neuro-muscular anomalies detected.   Muscle Tone/Strength: Functionally intact. No obvious neuro-muscular anomalies detected.    Sensory (Neurological): Unimpaired           Sensory (Neurological): Unimpaired            Palpation: No palpable anomalies               Palpation: No  palpable anomalies                Provocative Test(s):  Phalen's test: deferred Tinel's test: deferred Apley's scratch test (touch opposite shoulder):  Action 1 (Across chest): deferred Action 2 (Overhead): deferred Action 3 (LB reach): deferred     Provocative Test(s):  Phalen's test: deferred Tinel's test: deferred Apley's scratch test (touch opposite shoulder):  Action 1 (Across chest): deferred Action 2 (Overhead): deferred Action 3 (LB reach): deferred       Assessment   Status Diagnosis  Controlled Controlled Worsening 1. Cervical spondylosis without myelopathy   2. Cervical fusion syndrome (C2-T1)   3. Chronic pain  syndrome       Plan of Care   Kimberly [name] has a current medication list which includes the following long-term medication(s): escitalopram, liothyronine, nortriptyline, [START ON 02/13/2021] oxycodone hcl, [START ON 03/15/2021] oxycodone hcl, [START ON 04/14/2021] oxycodone hcl, and rizatriptan.  Pharmacotherapy (Medications Ordered): Meds ordered this encounter  Medications   Oxycodone HCl 10 MG TABS    Sig: Take 1 tablet (10 mg total) by mouth every 8 (eight) hours as needed. Must last 30 days.    Dispense:  75 tablet    Refill:  0    Chronic Pain. (STOP Act - Not applicable). Fill one day early if closed on scheduled refill date.   Oxycodone HCl 10 MG TABS    Sig: Take 1 tablet (10 mg total) by mouth every 8 (eight) hours as needed. Must last 30 days.    Dispense:  75 tablet    Refill:  0    Chronic Pain. (STOP Act - Not applicable). Fill one day early if closed on scheduled refill date.   Oxycodone HCl 10 MG TABS    Sig: Take 1 tablet (10 mg total) by mouth every 8 (eight) hours as needed. Must last 30 days.    Dispense:  75 tablet    Refill:  0    Chronic Pain. (STOP Act - Not applicable). Fill one day early if closed on scheduled refill date.   Follow-up plan:   Return in about 3 months (around 04/26/2021) for Medication Management, in  person.     cervical TPI, cervical facet medial branch nerve block, suprascapular nerve block, bilateral C4, C5, C6   cervical facet medial branch nerve block 07/04/2020: Not effective, do not repeat.        Recent Visits Date Type Provider Dept  11/10/20 Office Visit Kimberly Santa, Kimberly [name] [facility]  Showing recent visits within past 90 days and meeting all other requirements Today's Visits Date Type Provider Dept  01/24/21 Office Visit Kimberly Santa, Kimberly [name] [facility]  Showing today's visits and meeting all other requirements Future Appointments Date Type Provider Dept  04/20/21 Appointment Kimberly Santa, Kimberly [name] [facility]  Showing future appointments within next 90 days and meeting all other requirements I discussed the assessment and treatment plan with the patient. The patient was provided an opportunity to ask questions and all were answered. The patient agreed with the plan and demonstrated an understanding of the instructions.  Patient advised to call back or seek an in-person evaluation if the symptoms or condition worsens.  Duration of encounter: 30 minutes.  Note by: Kimberly Santa, Kimberly [name] Date: 01/24/2021; Time: 11:23 AM

## 2021-01-24 NOTE — Progress Notes (Signed)
Nursing Pain Medication Assessment:  Safety precautions to be maintained throughout the outpatient stay will include: orient to surroundings, keep bed in low position, maintain call bell within reach at all times, provide assistance with transfer out of bed and ambulation.  Medication Inspection Compliance: Pill count conducted under aseptic conditions, in front of the patient. Neither the pills nor the bottle was removed from the patient's sight at any time. Once count was completed pills were immediately returned to the patient in their original bottle.  Medication:  oxycodone Pill/Patch Count:  53 of 75 pills remain Pill/Patch Appearance: Markings consistent with prescribed medication Bottle Appearance: Standard pharmacy container. Clearly labeled. Filled Date: 06 / 18 / 2022 Last Medication intake:  Today

## 2021-01-31 ENCOUNTER — Encounter: Payer: Medicare Other | Admitting: Student in an Organized Health Care Education/Training Program

## 2021-01-31 IMAGING — CR DG ABDOMEN 1V
1 series · 1 of 1 positions shown · non-contrast
Comparison: 09/04/2018

CLINICAL DATA: Kidney stones

EXAM:
ABDOMEN - 1 VIEW

[abdomen kub]
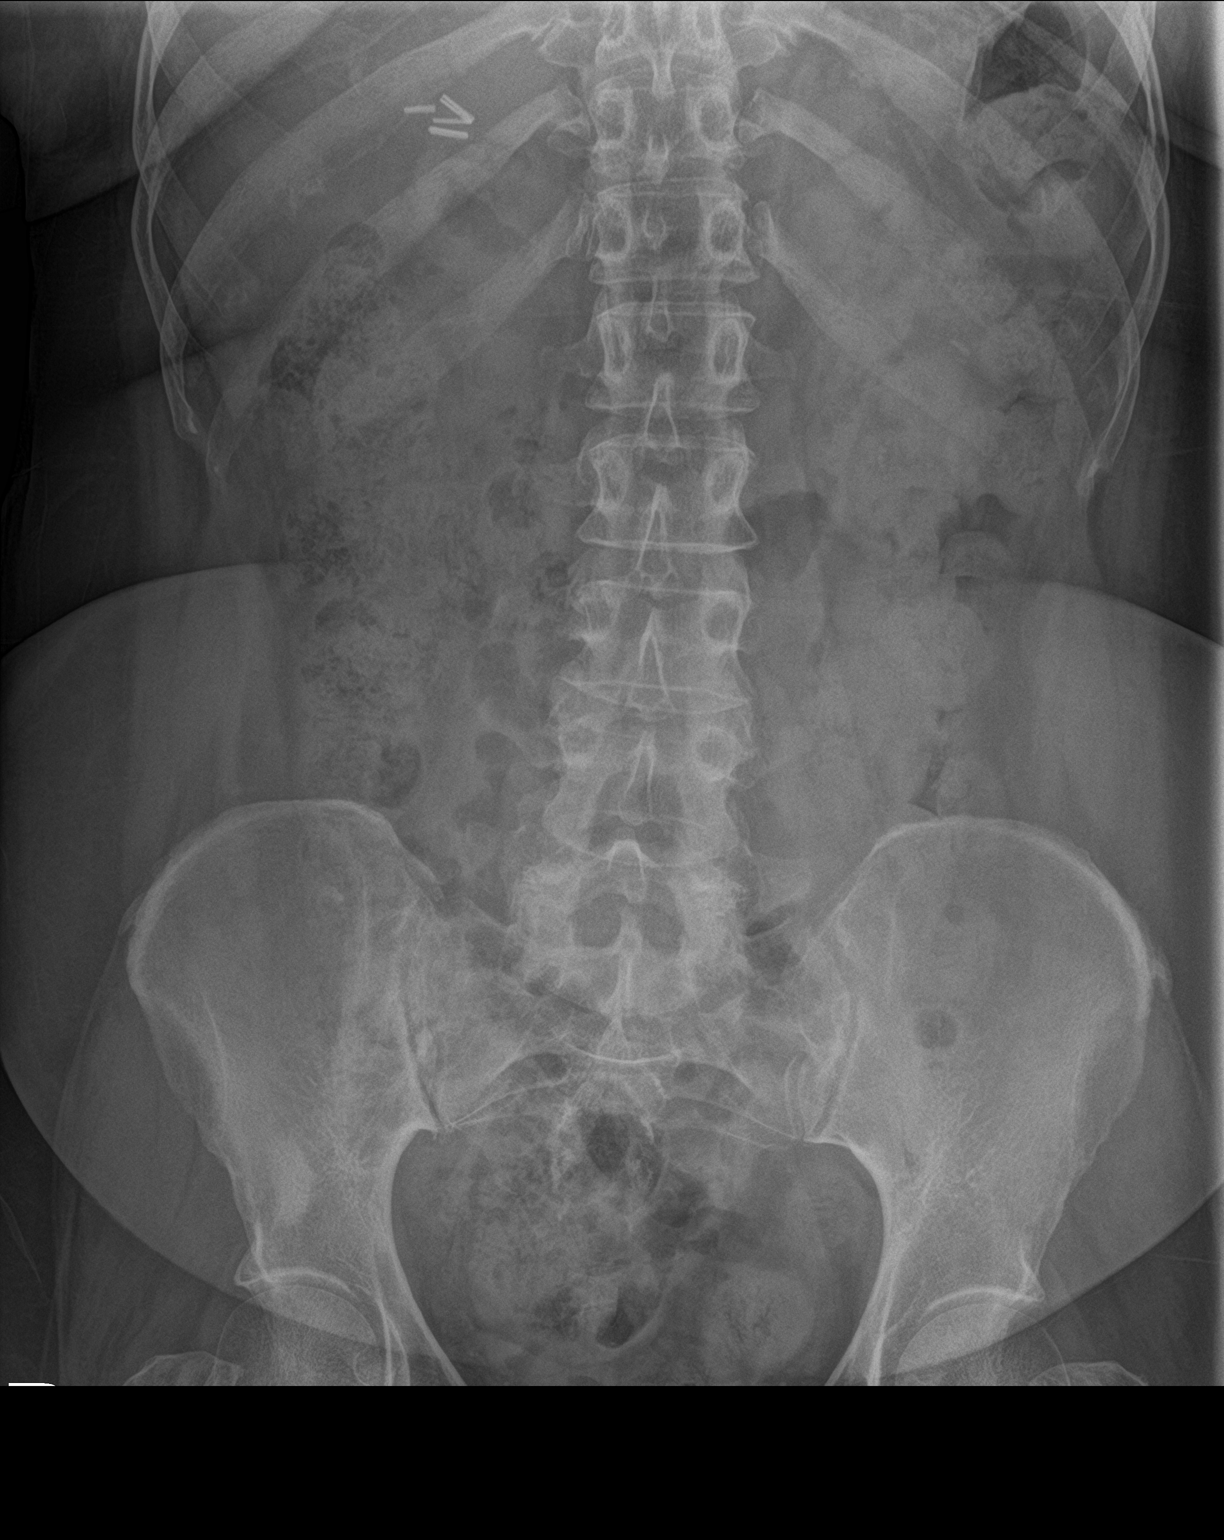

[1 of 1 positions shown; findings below may reference images not displayed]

FINDINGS: Cholecystectomy clips are noted within the right upper quadrant of
the abdomen. Approximately 4 small stone fragments are identified
within the inferior pole of right kidney measuring up to 2 mm. No
stones identified along the course of the left ureter. No right
renal calculi noted. Normal bowel gas pattern.
IMPRESSION: 1. Small stone fragments are noted within the inferior pole
collecting system of the left kidney status post lithotripsy.

## 2021-02-07 ENCOUNTER — Other Ambulatory Visit: Payer: Self-pay | Admitting: *Deleted

## 2021-02-07 DIAGNOSIS — N200 Calculus of kidney: Secondary | ICD-10-CM

## 2021-02-08 ENCOUNTER — Other Ambulatory Visit: Payer: Self-pay | Admitting: *Deleted

## 2021-02-08 ENCOUNTER — Ambulatory Visit
Admission: RE | Admit: 2021-02-08 | Discharge: 2021-02-08 | Disposition: A | Discharge status: Home / Self Care | Payer: Medicare Other | Source: Ambulatory Visit | Attending: Urology | Admitting: Urology

## 2021-02-08 ENCOUNTER — Ambulatory Visit: Payer: Self-pay | Admitting: Urology

## 2021-02-08 DIAGNOSIS — N200 Calculus of kidney: Secondary | ICD-10-CM

## 2021-02-08 NOTE — Progress Notes (Deleted)
02/08/2021 8:10 AM   Kimberly [name] 1960/09/19 [id number]  Referring provider: Dion Body, MD [facility] [place],  [place] [address]  No chief complaint on file.   Urologic history: 1.  Nephrolithiasis 7 mm left renal calculus treated SWL 08/2018   HPI: 60 y.o. female presents for annual follow-up.  No complaints since last years visit Denies flank, abdominal, pelvic pain No dysuria or gross hematuria  PMH: Past Medical History:  Diagnosis Date   Anxiety    takes alprazolam - rare use    Arthritis    cervical spondylosis    Asthma    Balance problem    Brain cyst    Depression    Difficult intubation    Small mouth opening, limited neck flexion, very anterior    Dyspnea    Flu 08/10/2018   tested positive   GERD (gastroesophageal reflux disease)    pt. reports that its better, no meds in use at this time- 2015   Headache    Herpes    History of kidney stones    History of pneumonia    Hypertension    pt. doesn't see cardiologist, followed for HTN by Dr. Gerarda Fraction   Inflammation of shoulder joint    Memory difficulties    Neuromuscular disorder ([place])    joint and muscle problems   Pneumothorax on right    following C4-6 ACDF 07/04/16   PONV (postoperative nausea and vomiting)    WITH SURGERY 07/04/16 (NECK) RT LUNG COLLAPSED   Recurrent falls    Urinary frequency     Surgical History: Past Surgical History:  Procedure Laterality Date   ABDOMINAL HYSTERECTOMY     ANTERIOR CERVICAL DECOMP/DISCECTOMY FUSION N/A 08/06/2014   Procedure: ANTERIOR CERVICAL DECOMPRESSION/DISCECTOMY FUSION CERVICAL THREE-FOUR,CERVICAL SIX-SEVEN ,CERVICAL SEVEN-THORACIC ONE;  Surgeon: Floyce Stakes, MD;  Location: [place];  Service: Neurosurgery;  Laterality: N/A;   ANTERIOR CERVICAL DECOMP/DISCECTOMY FUSION N/A 07/04/2016   Procedure: CERVICAL FOUR-FIVE, CERVICAL FIVE-SIX ANTERIOR CERVICAL DECOMPRESSION/DISCECTOMY/FUSION WITH REVISION OF  CERVICAL THREE-FOUR PLATE;  Surgeon: Leeroy Cha, MD;  Location: [place];  Service: Neurosurgery;  Laterality: N/A;   CARPAL TUNNEL RELEASE Left 02/16/2013   Procedure: CARPAL TUNNEL RELEASE;  Surgeon: Carole Civil, MD;  [facility];  Service: Orthopedics;  Laterality: Left;   CARPAL TUNNEL RELEASE Right 03/27/2013   Procedure: RIGHT CARPAL TUNNEL RELEASE;  Surgeon: Carole Civil, MD;  Location: [facility];  Service: Orthopedics;  Laterality: Right;   CESAREAN SECTION     x2   CHOLECYSTECTOMY N/A 06/17/2015   Procedure: LAPAROSCOPIC CHOLECYSTECTOMY;  Surgeon: Aviva Signs, MD;  [facility];  Service: General;  Laterality: N/A;   COLONOSCOPY     COLONOSCOPY WITH PROPOFOL N/A 05/06/2019   Procedure: COLONOSCOPY WITH PROPOFOL;  Surgeon: Robert Bellow, MD;  Location: [facility];  Service: Endoscopy;  Laterality: N/A;   EXTRACORPOREAL SHOCK WAVE LITHOTRIPSY Left 09/04/2018   Procedure: EXTRACORPOREAL SHOCK WAVE LITHOTRIPSY (ESWL);  Surgeon: Billey Co, MD;  Location: [facility];  Service: Urology;  Laterality: Left;   FOREIGN BODY REMOVAL Left    knee-as child   KNEE ARTHROSCOPY     NASAL SINUS SURGERY N/A 04/13/2015   Procedure: nasal endoscopy with adenoid biopsy;  Surgeon: Ruby Cola, MD;  Location: [facility];  Service: ENT;  Laterality: N/A;   NECK SURGERY  2016   x 3 all together   POSTERIOR CERVICAL FUSION/FORAMINOTOMY N/A 11/13/2016   Procedure: CERVICAL TWO-CERVICAL SIX POSTERIOR  CERVICAL FUSION WITH LATERAL MASS FIXATION;  Surgeon: Kristeen Miss, MD;  Location: [place];  Service: Neurosurgery;  Laterality: N/A;  posterior approach   REDUCTION MAMMAPLASTY     ROTATOR CUFF REPAIR Left    SHOULDER ACROMIOPLASTY Left 05/18/2015   Procedure: SHOULDER ACROMIOPLASTY;  Surgeon: Earlie Server, MD;  Location: [place];  Service: Orthopedics;  Laterality: Left;   SHOULDER ARTHROSCOPY WITH DISTAL CLAVICLE RESECTION Left 05/18/2015   Procedure: LEFT SHOULDER  ARTHROSCOPY WITH  DISTAL CLAVICLE RESECTION;  Surgeon: Earlie Server, MD;  Location: [place];  Service: Orthopedics;  Laterality: Left;    Home Medications:  Allergies as of 02/08/2021       Reactions   Shellfish Allergy Anaphylaxis, Hives   "Swells throat up"   Peanut-containing Drug Products Hives   Codeine Other (See Comments)   jitters   Diphtheria Toxoid Rash   Tetanus Toxoids Rash        Medication List        Accurate as of February 08, 2021  8:10 AM. If you have any questions, ask your nurse or doctor.          STOP taking these medications    rizatriptan 10 MG tablet Commonly known as: MAXALT       TAKE these medications    acetaminophen 500 MG tablet Commonly known as: TYLENOL Take 500 mg by mouth 2 (two) times daily as needed for headache.   acyclovir 400 MG tablet Commonly known as: ZOVIRAX Take 400 mg by mouth 2 (two) times daily.   ALPRAZolam 0.5 MG tablet Commonly known as: XANAX Take 0.5 mg by mouth 3 (three) times daily as needed for anxiety.   amLODipine 5 MG tablet Commonly known as: NORVASC Take 5 mg by mouth daily.   Cholecalciferol 25 MCG (1000 UT) tablet Take 1,000 Units by mouth daily.   diazepam 10 MG tablet Commonly known as: VALIUM Take 10 mg by mouth daily as needed for anxiety.   DULoxetine 30 MG capsule Commonly known as: CYMBALTA Take by mouth.   escitalopram 10 MG tablet Commonly known as: LEXAPRO Take 10 mg by mouth daily as needed (anxiety).   estradiol 0.5 MG tablet Commonly known as: ESTRACE Take 0.5 mg by mouth daily.   gabapentin 300 MG capsule Commonly known as: NEURONTIN Take by mouth.   liothyronine 5 MCG tablet Commonly known as: CYTOMEL Take 1 tablet by mouth daily.   meloxicam 7.5 MG tablet Commonly known as: MOBIC Take 1 tablet by mouth 2 (two) times daily.   methocarbamol 500 MG tablet Commonly known as: ROBAXIN Take 500 mg by mouth every 6 (six) hours as needed.    nortriptyline 10 MG capsule Commonly known as: PAMELOR Take 1 capsule by mouth at bedtime as needed for sleep.   nystatin 100000 UNIT/ML suspension Commonly known as: MYCOSTATIN Take 100,000 Units by mouth daily. Swish and spit   Oxycodone HCl 10 MG Tabs Take 1 tablet (10 mg total) by mouth every 8 (eight) hours as needed. Must last 30 days. Start taking on: February 13, 2021   Oxycodone HCl 10 MG Tabs Take 1 tablet (10 mg total) by mouth every 8 (eight) hours as needed. Must last 30 days. Start taking on: March 15, 2021   Oxycodone HCl 10 MG Tabs Take 1 tablet (10 mg total) by mouth every 8 (eight) hours as needed. Must last 30 days. Start taking on: April 14, 2021   pantoprazole 40 MG tablet Commonly known  as: PROTONIX Take 40 mg by mouth daily.   VISINE OP Apply 1 drop to eye daily as needed (irritation).        Allergies:  Allergies  Allergen Reactions   Shellfish Allergy Anaphylaxis and Hives    "Swells throat up"   Peanut-Containing Drug Products Hives   Codeine Other (See Comments)    jitters   Diphtheria Toxoid Rash   Tetanus Toxoids Rash    Family History: Family History  Problem Relation Age of Onset   Hypertension Father    Hypertension Other    Breast cancer Neg Hx     Social History:  reports that she has never smoked. She has never used smokeless tobacco. She reports that she does not drink alcohol and does not use drugs.   Physical Exam: There were no vitals taken for this visit.  Constitutional:  Alert and oriented, No acute distress. HEENT: [place] AT, moist mucus membranes.  Trachea midline, no masses. Cardiovascular: No clubbing, cyanosis, or edema. Respiratory: Normal respiratory effort, no increased work of breathing. GI: Abdomen is soft, nontender, nondistended, no abdominal masses GU: No CVA tenderness Lymph: No cervical or inguinal lymphadenopathy. Skin: No rashes, bruises or suspicious lesions. Neurologic: Grossly intact, no focal  deficits, moving all 4 extremities. Psychiatric: Normal mood and affect.  Laboratory Data: Lab Results  Component Value Date   WBC 11.6 (H) 07/15/2017   HGB 13.8 07/15/2017   HCT 41.8 07/15/2017   MCV 93.4 07/15/2017   PLT 247 07/15/2017    Lab Results  Component Value Date   CREATININE 0.86 07/01/2019    No results found for: PSA  No results found for: TESTOSTERONE  No results found for: HGBA1C  Urinalysis    Component Value Date/Time   COLORURINE YELLOW 07/31/2015 0947   APPEARANCEUR Cloudy (A) 01/19/2019 0957   LABSPEC 1.010 07/31/2015 0947   PHURINE 7.0 07/31/2015 0947   GLUCOSEU Negative 01/19/2019 0957   HGBUR SMALL (A) 07/31/2015 0947   BILIRUBINUR Negative 01/19/2019 0957   KETONESUR NEGATIVE 07/31/2015 0947   PROTEINUR Negative 01/19/2019 0957   PROTEINUR NEGATIVE 07/31/2015 0947   UROBILINOGEN 0.2 06/12/2015 2142   NITRITE Negative 01/19/2019 0957   NITRITE NEGATIVE 07/31/2015 0947   LEUKOCYTESUR Negative 01/19/2019 0957    Lab Results  Component Value Date   LABMICR See below: 01/19/2019   WBCUA 0-5 01/19/2019   RBCUA 3-10 (A) 09/15/2018   LABEPIT >10 (A) 01/19/2019   MUCUS Present (A) 09/15/2018   BACTERIA Moderate (A) 01/19/2019    Pertinent Imaging: *** Results for orders placed during the hospital encounter of 02/08/20  Abdomen 1 view (KUB)  Narrative CLINICAL DATA:  Kidney stone.  Hematuria  EXAM: ABDOMEN - 1 VIEW  COMPARISON:  03/20/2019.  CT 08/04/2018  FINDINGS: Previously seen stone in the lower pole of the left kidney not definitively seen by plain film. Rounded calcifications in the lower pelvis likely reflect phleboliths. Prior cholecystectomy. Nonobstructive bowel gas pattern with moderate stool burden.  IMPRESSION: No suspicious calcifications seen.  Moderate stool burden.   Electronically Signed By: Rolm Baptise M.D. On: 02/08/2020 12:15  No results found for this or any previous visit.  No results found  for this or any previous visit.  No results found for this or any previous visit.  No results found for this or any previous visit.  No results found for this or any previous visit.  No results found for this or any previous visit.  No results found for this  or any previous visit.   Assessment & Plan:    There are no diagnoses linked to this encounter.  No follow-ups on file.  Abbie Sons, [place] [address], [place], [place] [address] [phone]

## 2021-02-14 LAB — TOXASSURE SELECT 13 (MW), URINE

## 2021-02-14 IMAGING — CR DG ABDOMEN 1V
1 series · 2 of 2 positions shown · non-contrast
Comparison: 09/15/2018

CLINICAL DATA: Status post lithotripsy several weeks ago, initial
encounter

EXAM:
ABDOMEN - 1 VIEW

[Series 3: t abdomen supine · 0.14mm/px · 2 of 2 slices shown]
[im 1/2]
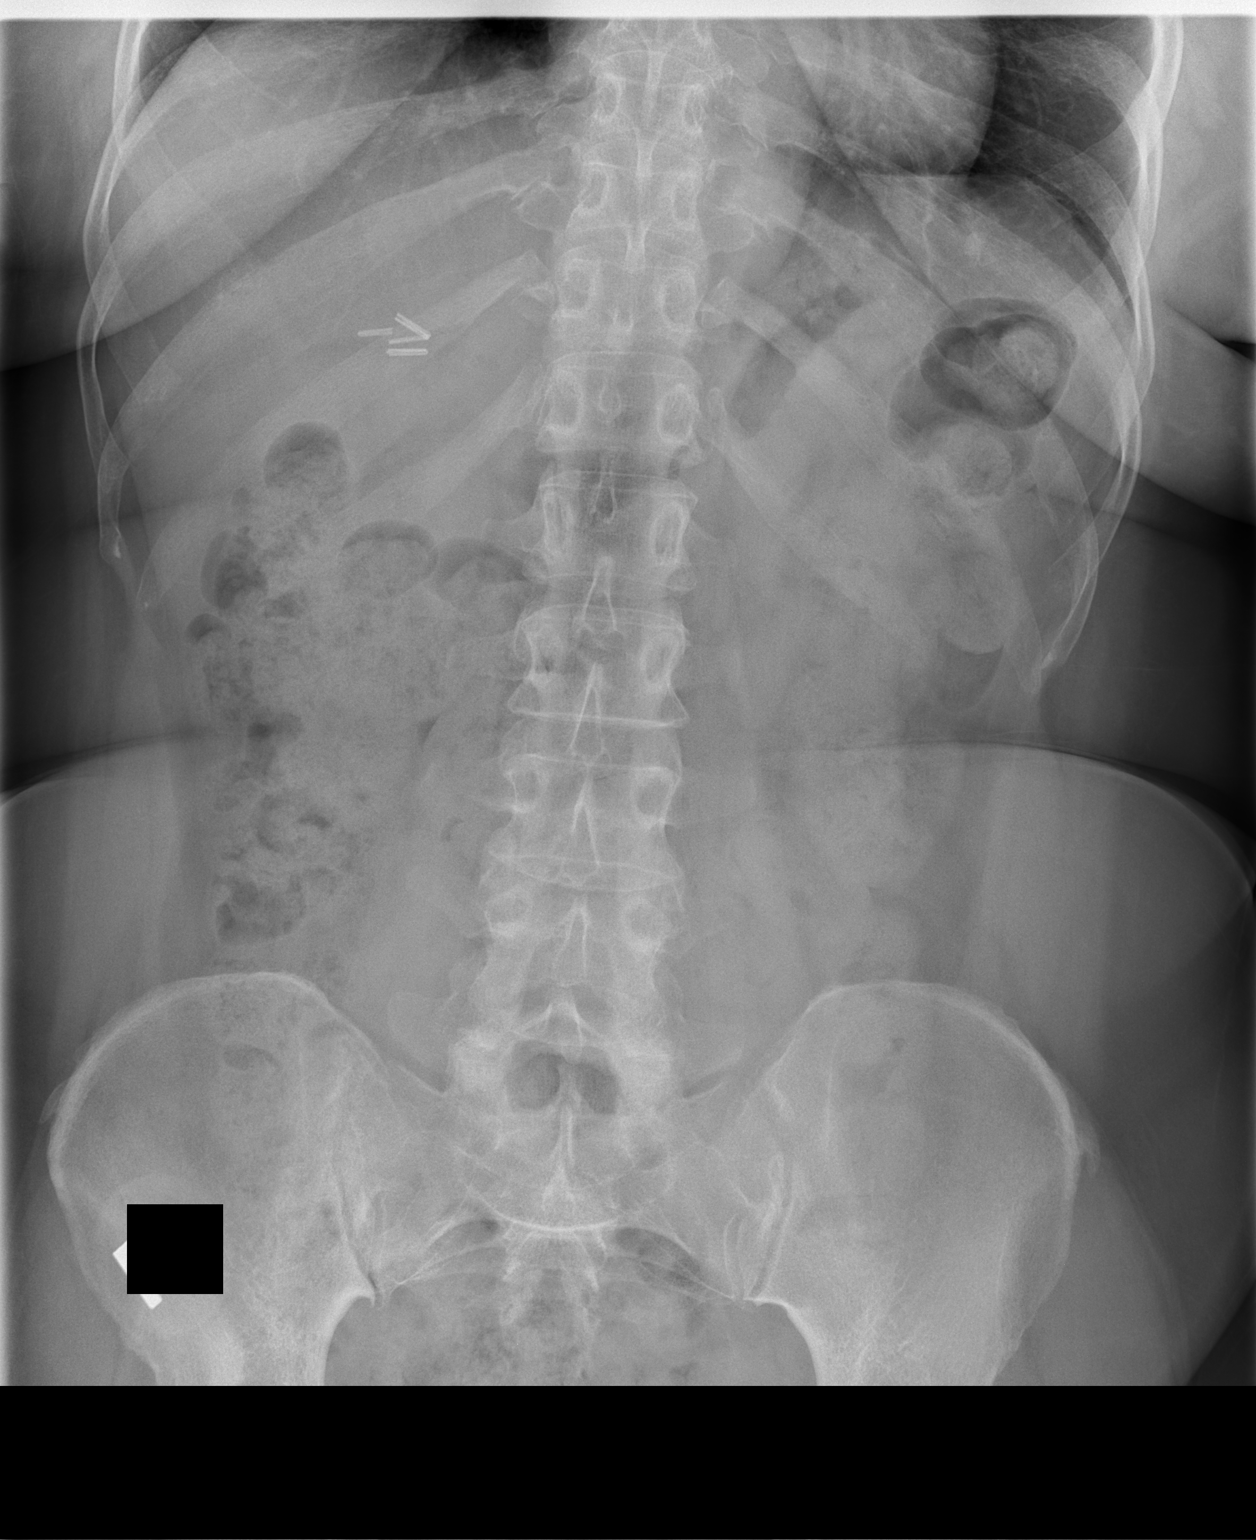
[im 2/2]
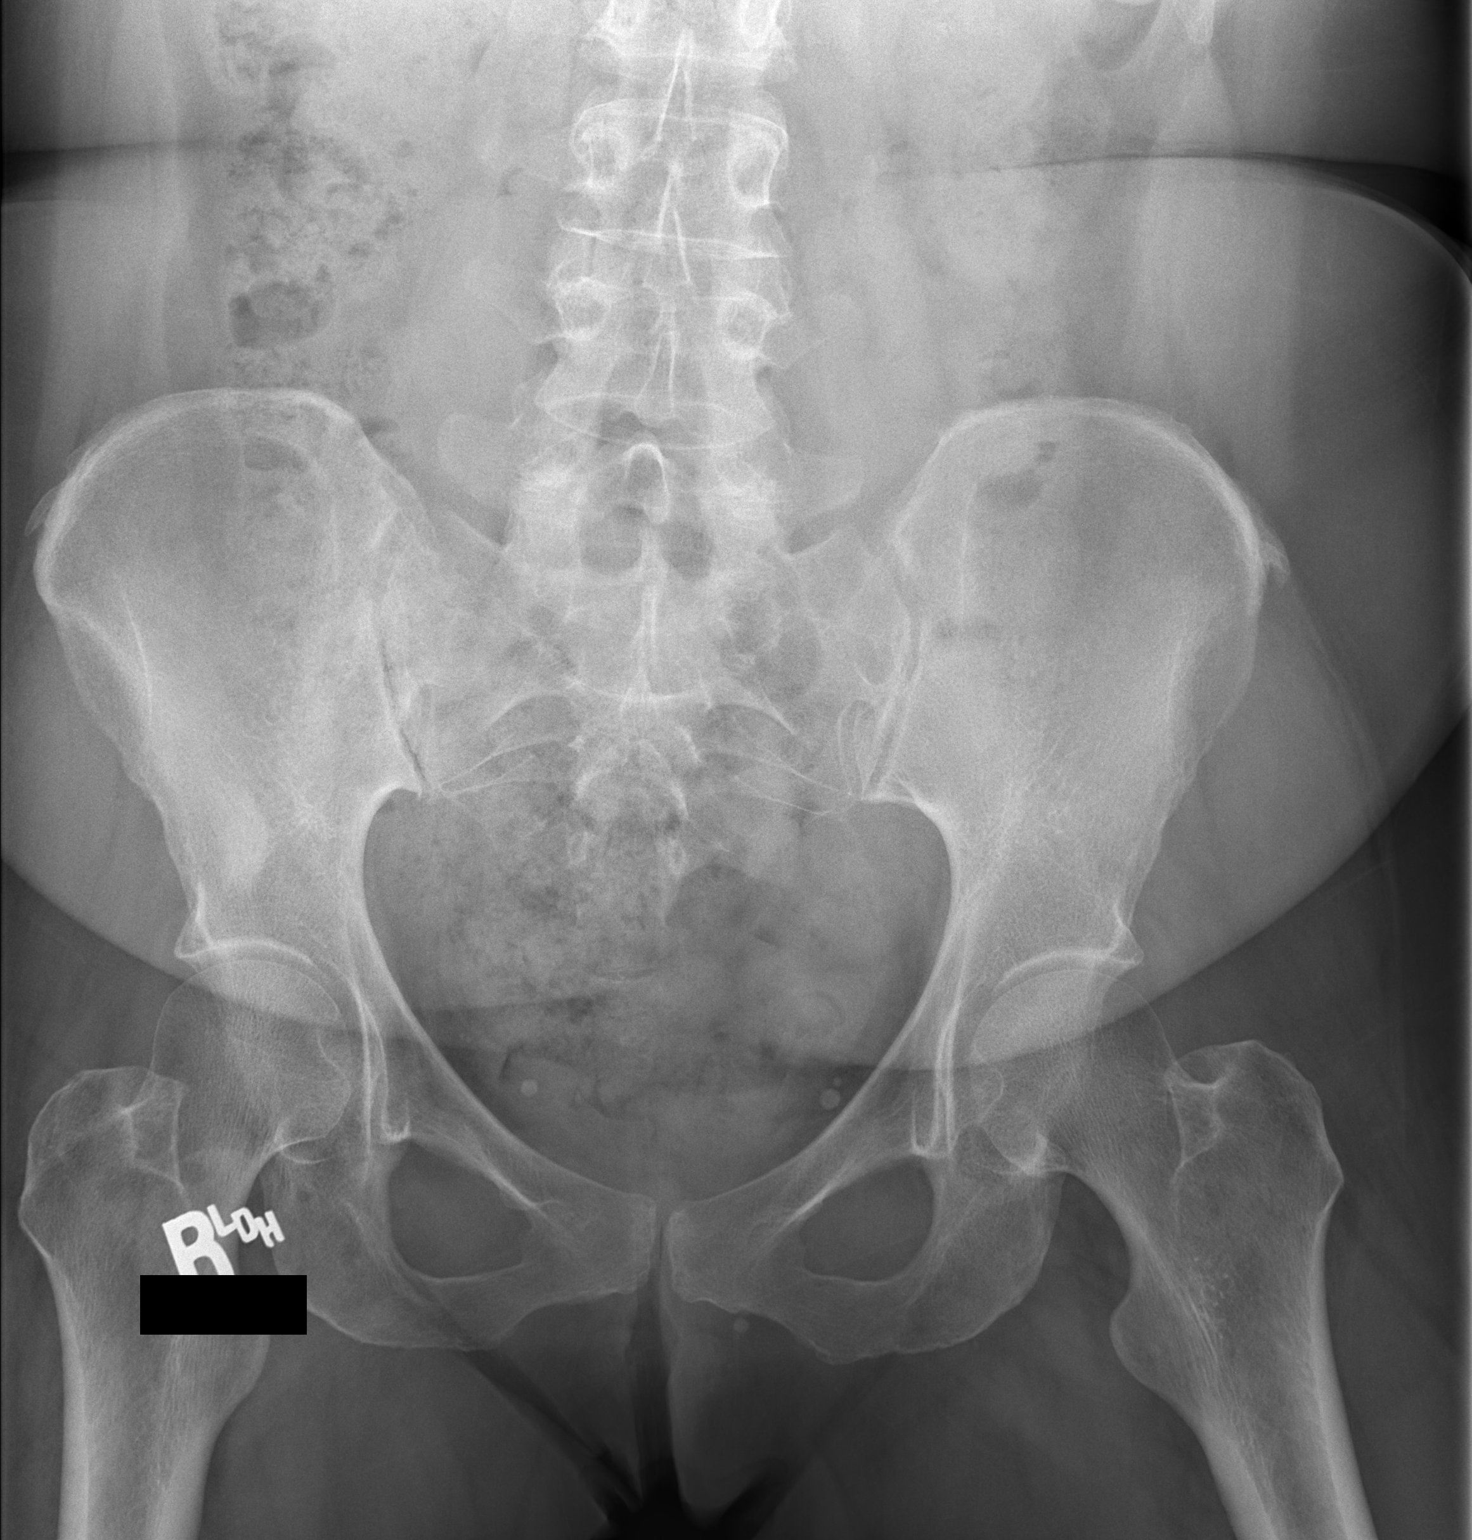

[2 of 2 positions shown; findings below may reference images not displayed]

FINDINGS: Scattered large and small bowel gas is noted. Kidneys are somewhat
obscured by overlying bowel gas and fecal material. Some suggestion
of tiny residual lower pole stones on the left is seen. No
definitive ureteral stones are seen. Phleboliths are noted. No bony
abnormality is noted.
IMPRESSION: Kidneys are somewhat obscured although the previously seen tiny
densities over the left kidney are again identified and stable.

## 2021-02-14 IMAGING — NM NM GASTRIC EMPTYING
5 series · 16 of 16 positions shown · non-contrast
Comparison: None

CLINICAL DATA: GERD with esophagitis

EXAM:
NUCLEAR MEDICINE GASTRIC EMPTYING SCAN
TECHNIQUE: After oral ingestion of radiolabeled meal, sequential abdominal
images were obtained for 3 hours. Percentage of activity emptying
the stomach was calculated at 1 hour, 2 hour, and 3 hours. Imaging
at 4 hours was not required..
RADIOPHARMACEUTICALS:  2.66 mCi 3c-CCm sulfur colloid in
standardized meal

[Series 1000: gastric statics · 3.90mm/px · 2 of 2 frames shown (1 of 3)]
[frame 1/2]
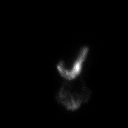
[frame 2/2]
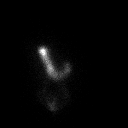

[Series 1000: gatric statics · 3.90mm/px · 2 of 2 frames shown]
[frame 1/2]
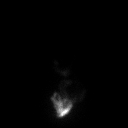
[frame 2/2]
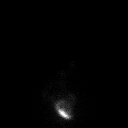

[Series 1000: gastric statics · 3.90mm/px · 2 of 2 frames shown (2 of 3)]
[frame 1/2]
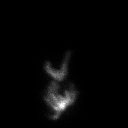
[frame 2/2]
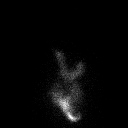

[Series 1000: gastric statics · 3.90mm/px · 2 of 2 frames shown (3 of 3)]
[frame 1/2]
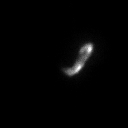
[frame 2/2]
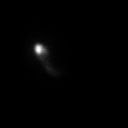

[Series 1000: gatric statics (results) · 3.90mm/px · 4 acquisitions, 8 frames shown]
[im 1/4]
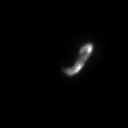
[im 1/4]
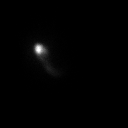
[im 2/4]
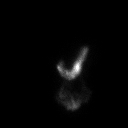
[im 2/4]
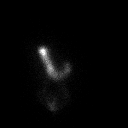
[im 3/4]
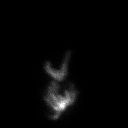
[im 3/4]
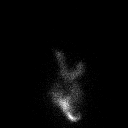
[im 4/4]
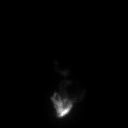
[im 4/4]
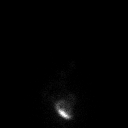

[16 of 16 positions shown; findings below may reference images not displayed]

FINDINGS: Expected location of the stomach in the left upper quadrant.

Ingested meal empties the stomach gradually over the course of the
study.

28% emptied at 1 hr ( normal >= 10%)

77% emptied at 2 hr ( normal >= 40%)

96% emptied at 3 hr ( normal >= 70%)

N/A emptied at 4 hr ( normal >= 90%)
IMPRESSION: Normal gastric emptying study.

## 2021-02-15 ENCOUNTER — Other Ambulatory Visit: Payer: Self-pay

## 2021-02-15 ENCOUNTER — Encounter: Payer: Self-pay | Admitting: Urology

## 2021-02-15 ENCOUNTER — Ambulatory Visit: Payer: Medicare Other | Admitting: Urology

## 2021-02-15 VITALS — BP 125/81 | HR 105 | Ht 61.0 in | Wt 145.0 lb

## 2021-02-15 DIAGNOSIS — N200 Calculus of kidney: Secondary | ICD-10-CM | POA: Diagnosis not present

## 2021-02-15 DIAGNOSIS — R1032 Left lower quadrant pain: Secondary | ICD-10-CM | POA: Diagnosis not present

## 2021-02-15 NOTE — Progress Notes (Signed)
02/15/2021 2:58 PM   [name] [birth year] [id number]  Referring provider: Dion Body, MD [facility] [place],  [place] [address]  Chief Complaint  Patient presents with   Nephrolithiasis    Urologic history: 1.  Nephrolithiasis 7 mm left lower pole calculus treated with SWL 09/04/2018 CT did show a 4 mm left lower pole calculus  HPI: 60 y.o. female presents for annual follow-up.  Recently developed left lower side pain above the iliac crest and left lower quadrant She thinks this may be related to her ovary No severe pain prior CT 2020 showed a 4 mm left lower pole calculus which has not been visualized on prior KUBs No bothersome LUTS Denies dysuria or gross hematuria   PMH: Past Medical History:  Diagnosis Date   Anxiety    takes alprazolam - rare use    Arthritis    cervical spondylosis    Asthma    Balance problem    Brain cyst    Depression    Difficult intubation    Small mouth opening, limited neck flexion, very anterior    Dyspnea    Flu 08/10/2018   tested positive   GERD (gastroesophageal reflux disease)    pt. reports that its better, no meds in use at this time- 2015   Headache    Herpes    History of kidney stones    History of pneumonia    Hypertension    pt. doesn't see cardiologist, followed for HTN by Dr. Gerarda Fraction   Inflammation of shoulder joint    Memory difficulties    Neuromuscular disorder ([place])    joint and muscle problems   Pneumothorax on right    following C4-6 ACDF 07/04/16   PONV (postoperative nausea and vomiting)    WITH SURGERY 07/04/16 (NECK) RT LUNG COLLAPSED   Recurrent falls    Urinary frequency     Surgical History: Past Surgical History:  Procedure Laterality Date   ABDOMINAL HYSTERECTOMY     ANTERIOR CERVICAL DECOMP/DISCECTOMY FUSION N/A 08/06/2014   Procedure: ANTERIOR CERVICAL DECOMPRESSION/DISCECTOMY FUSION CERVICAL THREE-FOUR,CERVICAL SIX-SEVEN ,CERVICAL SEVEN-THORACIC  ONE;  Surgeon: Floyce Stakes, MD;  Location: [place];  Service: Neurosurgery;  Laterality: N/A;   ANTERIOR CERVICAL DECOMP/DISCECTOMY FUSION N/A 07/04/2016   Procedure: CERVICAL FOUR-FIVE, CERVICAL FIVE-SIX ANTERIOR CERVICAL DECOMPRESSION/DISCECTOMY/FUSION WITH REVISION OF CERVICAL THREE-FOUR PLATE;  Surgeon: Leeroy Cha, MD;  Location: [facility];  Service: Neurosurgery;  Laterality: N/A;   CARPAL TUNNEL RELEASE Left 02/16/2013   Procedure: CARPAL TUNNEL RELEASE;  Surgeon: Carole Civil, MD;  [facility];  Service: Orthopedics;  Laterality: Left;   CARPAL TUNNEL RELEASE Right 03/27/2013   Procedure: RIGHT CARPAL TUNNEL RELEASE;  Surgeon: Carole Civil, MD;  [facility];  Service: Orthopedics;  Laterality: Right;   CESAREAN SECTION     x2   CHOLECYSTECTOMY N/A 06/17/2015   Procedure: LAPAROSCOPIC CHOLECYSTECTOMY;  Surgeon: Aviva Signs, MD;  Location[facility];  Service: General;  Laterality: N/A;   COLONOSCOPY     COLONOSCOPY WITH PROPOFOL N/A 05/06/2019   Procedure: COLONOSCOPY WITH PROPOFOL;  Surgeon: Robert Bellow, MD;  Location: [facility];  Service: Endoscopy;  Laterality: N/A;   EXTRACORPOREAL SHOCK WAVE LITHOTRIPSY Left 09/04/2018   Procedure: EXTRACORPOREAL SHOCK WAVE LITHOTRIPSY (ESWL);  Surgeon: Billey Co, MD;  Location: [facility];  Service: Urology;  Laterality: Left;   FOREIGN BODY REMOVAL Left    knee-as child   KNEE ARTHROSCOPY  NASAL SINUS SURGERY N/A 04/13/2015   Procedure: nasal endoscopy with adenoid biopsy;  Surgeon: Ruby Cola, MD;  Location: [place];  Service: ENT;  Laterality: N/A;   NECK SURGERY  2016   x 3 all together   POSTERIOR CERVICAL FUSION/FORAMINOTOMY N/A 11/13/2016   Procedure: CERVICAL TWO-CERVICAL SIX POSTERIOR CERVICAL FUSION WITH LATERAL MASS FIXATION;  Surgeon: Kristeen Miss, MD;  Location: [place];  Service: Neurosurgery;  Laterality: N/A;  posterior approach   REDUCTION MAMMAPLASTY     ROTATOR CUFF REPAIR Left    SHOULDER  ACROMIOPLASTY Left 05/18/2015   Procedure: SHOULDER ACROMIOPLASTY;  Surgeon: Earlie Server, MD;  Location: [place];  Service: Orthopedics;  Laterality: Left;   SHOULDER ARTHROSCOPY WITH DISTAL CLAVICLE RESECTION Left 05/18/2015   Procedure: LEFT SHOULDER ARTHROSCOPY WITH  DISTAL CLAVICLE RESECTION;  Surgeon: Earlie Server, MD;  Location: [place];  Service: Orthopedics;  Laterality: Left;    Home Medications:  Allergies as of 02/15/2021       Reactions   Shellfish Allergy Anaphylaxis, Hives   "Swells throat up"   Peanut-containing Drug Products Hives   Codeine Other (See Comments)   jitters   Diphtheria Toxoid Rash   Tetanus Toxoids Rash        Medication List        Accurate as of February 15, 2021  2:58 PM. If you have any questions, ask your nurse or doctor.          acetaminophen 500 MG tablet Commonly known as: TYLENOL Take 500 mg by mouth 2 (two) times daily as needed for headache.   acyclovir 400 MG tablet Commonly known as: ZOVIRAX Take 400 mg by mouth 2 (two) times daily.   ALPRAZolam 0.5 MG tablet Commonly known as: XANAX Take 0.5 mg by mouth 3 (three) times daily as needed for anxiety.   amLODipine 5 MG tablet Commonly known as: NORVASC Take 5 mg by mouth daily.   Cholecalciferol 25 MCG (1000 UT) tablet Take 1,000 Units by mouth daily.   diazepam 10 MG tablet Commonly known as: VALIUM Take 10 mg by mouth daily as needed for anxiety.   DULoxetine 30 MG capsule Commonly known as: CYMBALTA Take by mouth.   escitalopram 10 MG tablet Commonly known as: LEXAPRO Take 10 mg by mouth daily as needed (anxiety).   estradiol 0.5 MG tablet Commonly known as: ESTRACE Take 0.5 mg by mouth daily.   gabapentin 300 MG capsule Commonly known as: NEURONTIN Take by mouth.   liothyronine 5 MCG tablet Commonly known as: CYTOMEL Take 1 tablet by mouth daily.   meloxicam 7.5 MG tablet Commonly known as: MOBIC Take 1 tablet  by mouth 2 (two) times daily.   methocarbamol 500 MG tablet Commonly known as: ROBAXIN Take 500 mg by mouth every 6 (six) hours as needed.   nortriptyline 10 MG capsule Commonly known as: PAMELOR Take 1 capsule by mouth at bedtime as needed for sleep.   nystatin 100000 UNIT/ML suspension Commonly known as: MYCOSTATIN Take 100,000 Units by mouth daily. Swish and spit   Oxycodone HCl 10 MG Tabs Take 1 tablet (10 mg total) by mouth every 8 (eight) hours as needed. Must last 30 days.   Oxycodone HCl 10 MG Tabs Take 1 tablet (10 mg total) by mouth every 8 (eight) hours as needed. Must last 30 days. Start taking on: March 15, 2021   Oxycodone HCl 10 MG Tabs Take 1 tablet (10 mg total) by mouth every 8 (eight)  hours as needed. Must last 30 days. Start taking on: April 14, 2021   pantoprazole 40 MG tablet Commonly known as: PROTONIX Take 40 mg by mouth daily.   VISINE OP Apply 1 drop to eye daily as needed (irritation).        Allergies:  Allergies  Allergen Reactions   Shellfish Allergy Anaphylaxis and Hives    "Swells throat up"   Peanut-Containing Drug Products Hives   Codeine Other (See Comments)    jitters   Diphtheria Toxoid Rash   Tetanus Toxoids Rash    Family History: Family History  Problem Relation Age of Onset   Hypertension Father    Hypertension Other    Breast cancer Neg Hx     Social History:  reports that she has never smoked. She has never used smokeless tobacco. She reports that she does not drink alcohol and does not use drugs.   Physical Exam: There were no vitals taken for this visit.  Constitutional:  Alert and oriented, No acute distress. HEENT: [place] AT, moist mucus membranes.  Trachea midline, no masses. Cardiovascular: No clubbing, cyanosis, or edema. Respiratory: Normal respiratory effort, no increased work of breathing. Neurologic: Grossly intact, no focal deficits, moving all 4 extremities. Psychiatric: Normal mood and  affect.    Pertinent Imaging: KUB images personally reviewed and interpreted.  No abnormal calcifications overlying the renal outlines or expected course of the ureter identified  Abdomen 1 view (KUB)  Narrative CLINICAL DATA:  Nephrolithiasis.  EXAM: ABDOMEN - 1 VIEW  COMPARISON:  02/08/2020  FINDINGS: Normal bowel gas pattern. Moderate amount of stool throughout the colon without significant change. Cholecystectomy clips. Stable bilateral pelvic phleboliths. No calcified urinary tract calculi seen. Unremarkable bones.  IMPRESSION: 1. No calcified urinary tract calculi seen. 2. Moderate stool throughout the colon without significant change.   Electronically Signed By: Claudie Revering M.D. On: 02/10/2021 18:19    Assessment & Plan:    1.  Nephrolithiasis 4 mm left lower pole calculus seen on prior CT not visualized  2.  Left lower quadrant abdominal pain Schedule stone protocol CT abdomen pelvis and will call with results   Abbie Sons, [place] [address] [place], [place] [address] [phone]

## 2021-02-22 ENCOUNTER — Encounter: Payer: Self-pay | Admitting: Student in an Organized Health Care Education/Training Program
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
## 2021-02-23 ENCOUNTER — Ambulatory Visit
Payer: Medicare Other | Attending: Student in an Organized Health Care Education/Training Program | Admitting: Student in an Organized Health Care Education/Training Program

## 2021-02-23 ENCOUNTER — Encounter: Payer: Self-pay | Admitting: Student in an Organized Health Care Education/Training Program

## 2021-02-23 ENCOUNTER — Other Ambulatory Visit: Payer: Self-pay

## 2021-02-23 DIAGNOSIS — M7918 Myalgia, other site: Secondary | ICD-10-CM | POA: Diagnosis not present

## 2021-02-23 DIAGNOSIS — Z9889 Other specified postprocedural states: Secondary | ICD-10-CM | POA: Diagnosis not present

## 2021-02-23 DIAGNOSIS — G894 Chronic pain syndrome: Secondary | ICD-10-CM

## 2021-02-23 DIAGNOSIS — M47812 Spondylosis without myelopathy or radiculopathy, cervical region: Secondary | ICD-10-CM | POA: Diagnosis not present

## 2021-02-23 DIAGNOSIS — Q761 Klippel-Feil syndrome: Secondary | ICD-10-CM

## 2021-02-23 MED ORDER — PREDNISONE 20 MG PO TABS: ORAL_TABLET | ORAL | 0 refills | Status: AC

## 2021-02-23 NOTE — Progress Notes (Signed)
Patient: Kimberly [name]  Service Category: E/M  Provider: Gillis Santa, MD  DOB: 01/30/1961  DOS: 02/23/2021  Location: Office  MRN: [id number]  Setting: Ambulatory outpatient  Referring Provider: Dion Body, MD  Type: Established Patient  Specialty: Interventional Pain Management  PCP: Dion Body, MD  Location: Remote location  Delivery: TeleHealth     Virtual Encounter - Pain Management PROVIDER NOTE: Information contained herein reflects review and annotations entered in association with encounter. Interpretation of such information and data should be left to medically-trained personnel. Information provided to patient can be located elsewhere in the medical record under "Patient Instructions". Document created using STT-dictation technology, any transcriptional errors that may result from process are unintentional.    Contact & Pharmacy Preferred: [phone] Home: [phone] (home) Mobile: [phone] (mobile) E-mail: [email or web address] .[place], [place] - [place] [address] [place] [address] Phone: [phone] Fax: [phone]  [facility] ([facility]) - [place] [place] [address] Phone: [phone] Fax: [phone]   Pre-screening  Kimberly [name] offered "in-person" vs "virtual" encounter. She indicated preferring virtual for this encounter.   Reason COVID-19*  Social distancing based on CDC and AMA recommendations.   I contacted Kimberly [name] on 02/23/2021 via video conference.      I clearly identified myself as Gillis Santa, MD. I verified that I was speaking with the correct person using two identifiers (Name: Kimberly [name], and date of birth: [birth year]).  Consent I sought verbal advanced consent from Kimberly [name] for virtual visit interactions. I informed Kimberly [name] of possible security and privacy concerns, risks,  and limitations associated with providing "not-in-person" medical evaluation and management services. I also informed Kimberly [name] of the availability of "in-person" appointments. Finally, I informed her that there would be a charge for the virtual visit and that she could be  personally, fully or partially, financially responsible for it. Kimberly [name] expressed understanding and agreed to proceed.   Historic Elements   Kimberly [name] is a 59 y.o. year old, female patient evaluated today after our last contact on 01/24/2021. Kimberly [name]  has a past medical history of Anxiety, Arthritis, Asthma, Balance problem, Brain cyst, Depression, Difficult intubation, Dyspnea, Flu (08/10/2018), GERD (gastroesophageal reflux disease), Headache, Herpes, History of kidney stones, History of pneumonia, Hypertension, Inflammation of shoulder joint, Memory difficulties, Neuromuscular disorder ([place]), Pneumothorax on right, PONV (postoperative nausea and vomiting), Recurrent falls, and Urinary frequency. She also  has a past surgical history that includes Abdominal hysterectomy; Rotator cuff repair (Left); Foreign Body Removal (Left); Carpal tunnel release (Left, 02/16/2013); Carpal tunnel release (Right, 03/27/2013); Cesarean section; Anterior cervical decomp/discectomy fusion (N/A, 08/06/2014); Colonoscopy; Nasal sinus surgery (N/A, 04/13/2015); Shoulder arthroscopy with distal clavicle resection (Left, 05/18/2015); Shoulder acromioplasty (Left, 05/18/2015); Cholecystectomy (N/A, 06/17/2015); Neck surgery (2016); Anterior cervical decomp/discectomy fusion (N/A, 07/04/2016); Posterior cervical fusion/foraminotomy (N/A, 11/13/2016); Knee arthroscopy; Extracorporeal shock wave lithotripsy (Left, 09/04/2018); Colonoscopy with propofol (N/A, 05/06/2019); and Reduction mammaplasty. Kimberly [name] has a current medication list which includes the following prescription(s): acetaminophen, acyclovir, alprazolam, amlodipine, cholecalciferol, diazepam,  duloxetine, escitalopram, estradiol, gabapentin, liothyronine, meloxicam, methocarbamol, nortriptyline, nystatin, oxycodone hcl, [START ON 03/15/2021] oxycodone hcl, [START ON 04/14/2021] oxycodone hcl, pantoprazole, prednisone, and tetrahydrozoline hcl. She  reports that she has never smoked. She has never used smokeless tobacco. She reports that she does not drink alcohol and does not use drugs. Kimberly [name] is allergic  to shellfish allergy, peanut-containing drug products, codeine, diphtheria toxoid, and tetanus toxoids.   HPI  Today, she is being contacted for worsening of previously known (established) problem  Increased neck pain with turning to the left Recommend repeat xray, and prednisone taper No inciting or traumatic event    Pharmacotherapy Assessment   Analgesic: Oxycodone 10 mg BID PRN #60/month   Monitoring:  PMP: PDMP reviewed during this encounter.       Pharmacotherapy: No side-effects or adverse reactions reported. Compliance: No problems identified. Effectiveness: Clinically acceptable. Plan: Refer to "POC". UDS:  Summary  Date Value Ref Range Status  02/08/2021 Note  Final    Comment:    ==================================================================== [employer] Select 13 (MW) ==================================================================== Test                             Result       Flag       Units  Drug Present and Declared for Prescription Verification   Oxycodone                      1168         EXPECTED   ng/mg creat   Oxymorphone                    2167         EXPECTED   ng/mg creat   Noroxycodone                   1622         EXPECTED   ng/mg creat   Noroxymorphone                 560          EXPECTED   ng/mg creat    Sources of oxycodone are scheduled prescription medications.    Oxymorphone, noroxycodone, and noroxymorphone are expected    metabolites of oxycodone. Oxymorphone is also available as a    scheduled prescription  medication.  Drug Absent but Declared for Prescription Verification   Diazepam                       Not Detected UNEXPECTED ng/mg creat   Alprazolam                     Not Detected UNEXPECTED ng/mg creat ==================================================================== Test                      Result    Flag   Units      Ref Range   Creatinine              183              mg/dL      >=20 ==================================================================== Declared Medications:  The flagging and interpretation on this report are based on the  following declared medications.  Unexpected results may arise from  inaccuracies in the declared medications.   **Note: The testing scope of this panel includes these medications:   Alprazolam (Xanax)  Diazepam (Valium)  Oxycodone   **Note: The testing scope of this panel does not include the  following reported medications:   Acetaminophen (Tylenol)  Acyclovir  Amlodipine  Cholecalciferol  Duloxetine (Cymbalta)  Escitalopram (Lexapro)  Estradiol (Estrace)  Eye Drop  Gabapentin  Liothyronine (Cytomel)  Nortriptyline (Pamelor)  Nystatin  Pantoprazole (  Protonix) ==================================================================== For clinical consultation, please call [phone]. ====================================================================      Laboratory Chemistry Profile   Renal Lab Results  Component Value Date   BUN 9 07/01/2019   CREATININE 0.86 07/01/2019   BCR 10 07/01/2019   GFRAA 86 07/01/2019   GFRNONAA 75 07/01/2019    Hepatic Lab Results  Component Value Date   AST 26 07/31/2015   ALT 17 07/31/2015   ALBUMIN 4.1 07/31/2015   ALKPHOS 93 07/31/2015   LIPASE 23 07/15/2017    Electrolytes Lab Results  Component Value Date   NA 144 07/01/2019   K 4.8 07/01/2019   CL 103 07/01/2019   CALCIUM 10.3 (H) 07/01/2019    Bone No results found for: VD25OH, VD125OH2TOT, NM0768GS8, PJ0315XY5,  25OHVITD1, 25OHVITD2, 25OHVITD3, TESTOFREE, TESTOSTERONE  Inflammation (CRP: Acute Phase) (ESR: Chronic Phase) No results found for: CRP, ESRSEDRATE, LATICACIDVEN       Note: Above Lab results reviewed.  Imaging  Abdomen 1 view (KUB) CLINICAL DATA:  Nephrolithiasis.  EXAM: ABDOMEN - 1 VIEW  COMPARISON:  02/08/2020  FINDINGS: Normal bowel gas pattern. Moderate amount of stool throughout the colon without significant change. Cholecystectomy clips. Stable bilateral pelvic phleboliths. No calcified urinary tract calculi seen. Unremarkable bones.  IMPRESSION: 1. No calcified urinary tract calculi seen. 2. Moderate stool throughout the colon without significant change.  Electronically Signed   By: Claudie Revering M.D.   On: 02/10/2021 18:19  Assessment  The primary encounter diagnosis was Cervical spondylosis without myelopathy. Diagnoses of Hx of cervical spine surgery, Cervical myofascial pain syndrome, Cervical fusion syndrome (C2-T1), and Chronic pain syndrome were also pertinent to this visit.  Plan of Care    Kimberly [name] has a current medication list which includes the following long-term medication(s): escitalopram, liothyronine, nortriptyline, oxycodone hcl, [START ON 03/15/2021] oxycodone hcl, and [START ON 04/14/2021] oxycodone hcl.  Pharmacotherapy (Medications Ordered): Meds ordered this encounter  Medications   predniSONE (DELTASONE) 20 MG tablet    Sig: Take 3 tablets (60 mg total) by mouth daily with breakfast for 3 days, THEN 2 tablets (40 mg total) daily with breakfast for 3 days, THEN 1 tablet (20 mg total) daily with breakfast for 3 days.    Dispense:  18 tablet    Refill:  0    Orders:  Orders Placed This Encounter  Procedures   DG Cervical Spine With Flex & Extend    Patient presents with axial pain with possible radicular component.  Please evaluate for any evidence of cervical spine instability. Describe the presence of any spondylolisthesis  (Antero- or retrolisthesis). If present, provide displacement "Grade" and measurement in cm. Please describe presence and specific location (Level & Laterality) of any signs of  osteoarthritis, zygapophyseal (Facet) joints DJD (including decreased joint space and/or osteophytosis), DDD, Foraminal narrowing, as well as any sclerosis and/or cyst formation. Please comment on ROM. In addition to any acute findings, please report on:  1. Facet (Zygapophyseal) joint DJD (Hypertrophy, space narrowing, subchondral sclerosis, and/or osteophyte formation) 2. DDD and/or IVDD (Loss of disc height, desiccation or "Black disc disease") 3. Pars defects 4. Spondylolisthesis, spondylosis, and/or spondyloarthropathies (include Degree/Grade of displacement in mm) 5. Vertebral body Fractures, including age (old, new/acute) 48. Modic Type Changes 7. Demineralization 8. Bone pathology 9. Central, Lateral Recess, and/or Foraminal Stenosis (include AP diameter of stenosis in mm) 10. Surgical changes (hardware type, status, and presence of fibrosis) NOTE: Please specify level(s) and laterality. If applicable: Please indicate ROM and/or evidence of instability (>  38m displacement between flexion and extension views)    Standing Status:   Future    Standing Expiration Date:   03/26/2021    Scheduling Instructions:     Imaging must be done as soon as possible. Inform patient that order will expire within 30 days and I will not renew it.    Order Specific Question:   Reason for Exam (SYMPTOM  OR DIAGNOSIS REQUIRED)    Answer:   Cervicalgia    Order Specific Question:   Is patient pregnant?    Answer:   No    Order Specific Question:   Preferred imaging location?    Answer:   [facility]    Order Specific Question:   Call Results- Best Contact Number?    Answer:   [phone][facility]    Order Specific Question:   Radiology Contrast Protocol - do NOT remove file path    Answer:    [email or web address]    Order Specific Question:   Release to patient    Answer:   Immediate    Follow-up plan:   Return for Keep sch. appt.     cervical TPI, cervical facet medial branch nerve block, suprascapular nerve block, bilateral C4, C5, C6   cervical facet medial branch nerve block 07/04/2020: Not effective, do not repeat.         Recent Visits Date Type Provider Dept  01/24/21 Office Visit LGillis Santa MD [facility]  Showing recent visits within past 90 days and meeting all other requirements Today's Visits Date Type Provider Dept  02/23/21 Telemedicine LGillis Santa MD [facility]  Showing today's visits and meeting all other requirements Future Appointments Date Type Provider Dept  04/20/21 Appointment LGillis Santa MD [facility]  Showing future appointments within next 90 days and meeting all other requirements I discussed the assessment and treatment plan with the patient. The patient was provided an opportunity to ask questions and all were answered. The patient agreed with the plan and demonstrated an understanding of the instructions.  Patient advised to call back or seek an in-person evaluation if the symptoms or condition worsens.  Duration of encounter: 20 minutes.  Note by: BGillis Santa MD Date: 02/23/2021; Time: 1:26 PM

## 2021-02-27 ENCOUNTER — Ambulatory Visit
Admission: RE | Admit: 2021-02-27 | Discharge: 2021-02-27 | Disposition: A | Discharge status: Home / Self Care | Payer: Medicare Other | Source: Ambulatory Visit | Attending: Student in an Organized Health Care Education/Training Program | Admitting: Student in an Organized Health Care Education/Training Program

## 2021-02-27 DIAGNOSIS — M47812 Spondylosis without myelopathy or radiculopathy, cervical region: Secondary | ICD-10-CM | POA: Insufficient documentation

## 2021-02-27 DIAGNOSIS — G894 Chronic pain syndrome: Secondary | ICD-10-CM

## 2021-03-08 ENCOUNTER — Ambulatory Visit
Admission: RE | Admit: 2021-03-08 | Discharge: 2021-03-08 | Disposition: A | Discharge status: Home / Self Care | Payer: Medicare Other | Source: Ambulatory Visit | Attending: Urology | Admitting: Urology

## 2021-03-08 ENCOUNTER — Other Ambulatory Visit: Payer: Self-pay

## 2021-03-08 DIAGNOSIS — R1032 Left lower quadrant pain: Secondary | ICD-10-CM | POA: Insufficient documentation

## 2021-03-08 DIAGNOSIS — N200 Calculus of kidney: Secondary | ICD-10-CM | POA: Diagnosis present

## 2021-03-09 IMAGING — MG DIGITAL DIAGNOSTIC BILATERAL MAMMOGRAM WITH TOMO AND CAD
6 of 12 series · 6 of 36 positions shown · non-contrast
Comparison: Previous exam(s).

CLINICAL DATA: Palpable lump in the right chest wall above the
breast.

EXAM:
DIGITAL DIAGNOSTIC BILATERAL MAMMOGRAM WITH CAD AND TOMO
ULTRASOUND RIGHT BREAST

[L CC synth-2D]
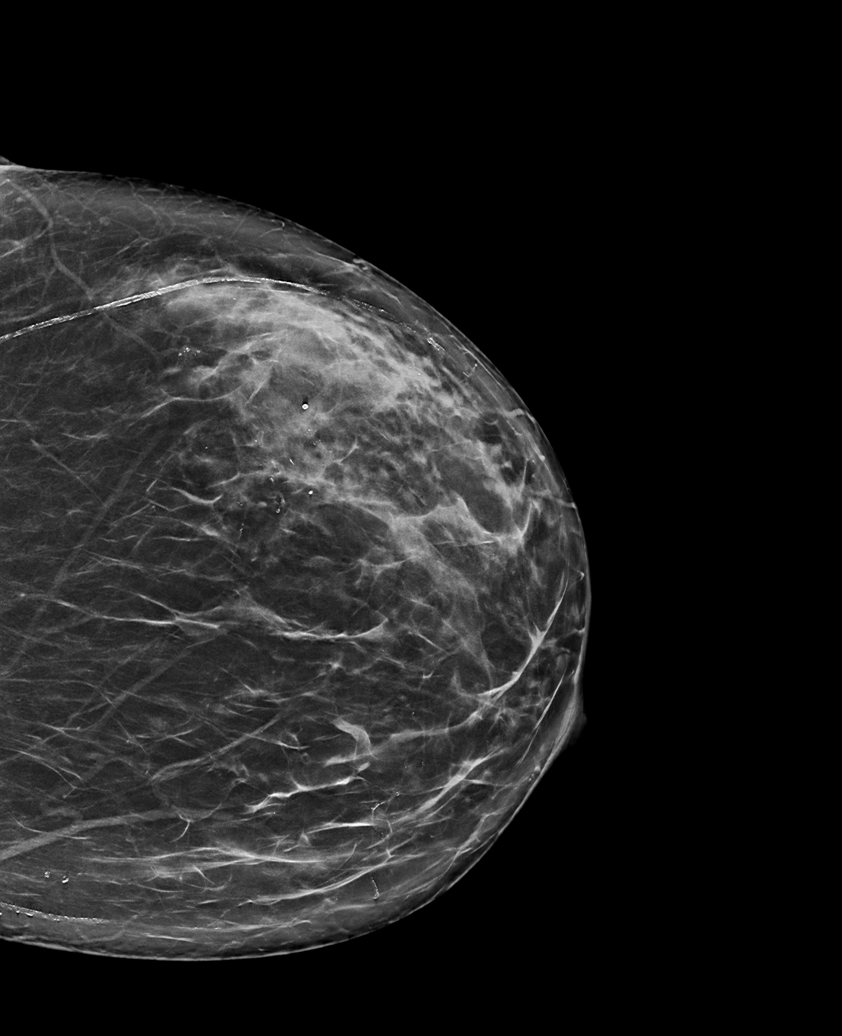

[L MLO synth-2D]
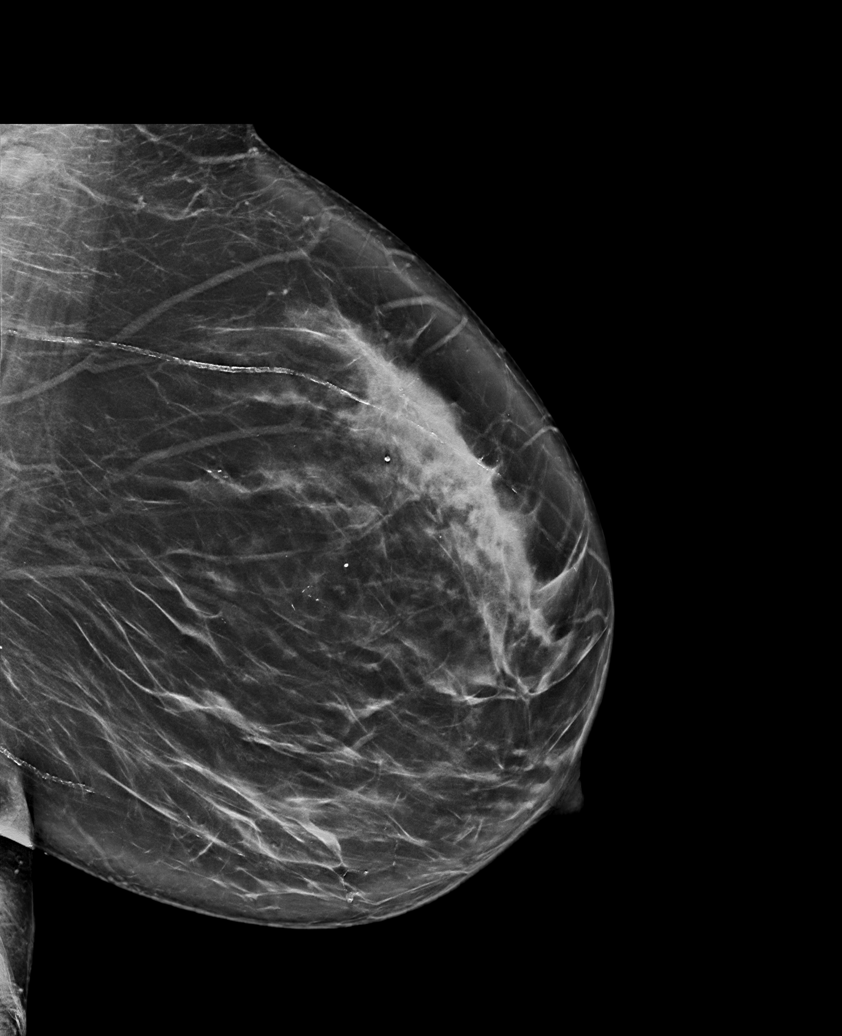

[R MLO synth-2D (1 of 2)]
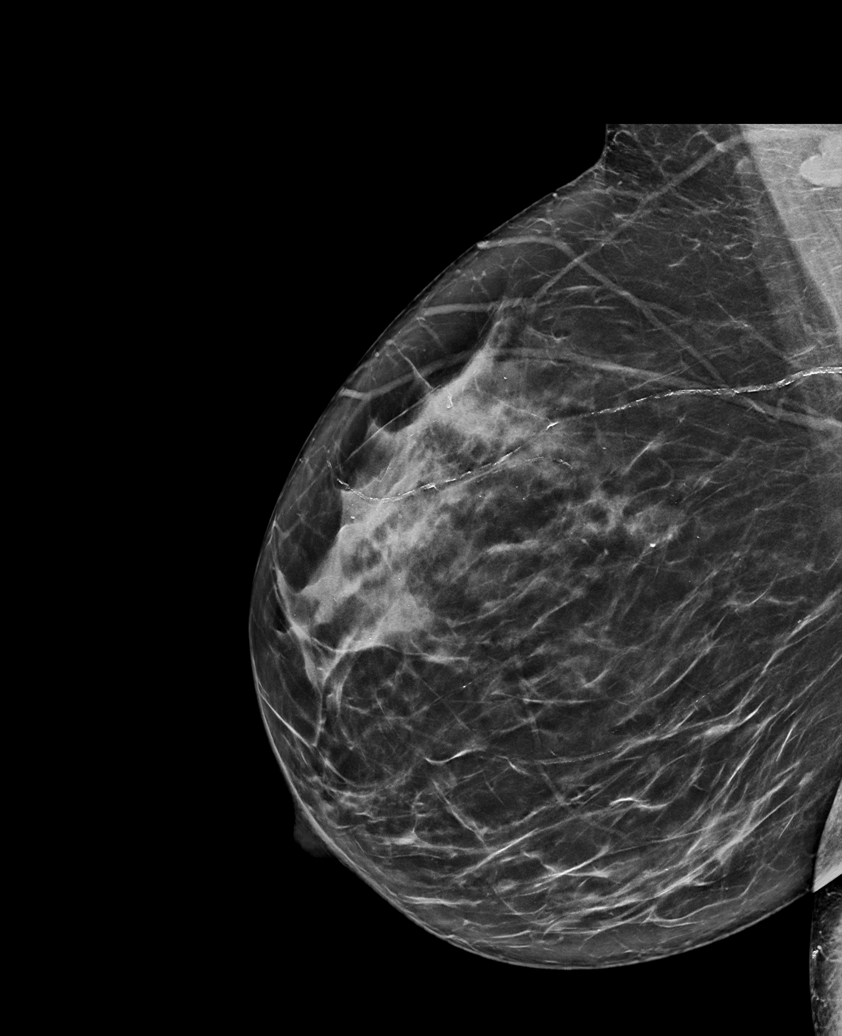

[R MLO synth-2D (2 of 2)]
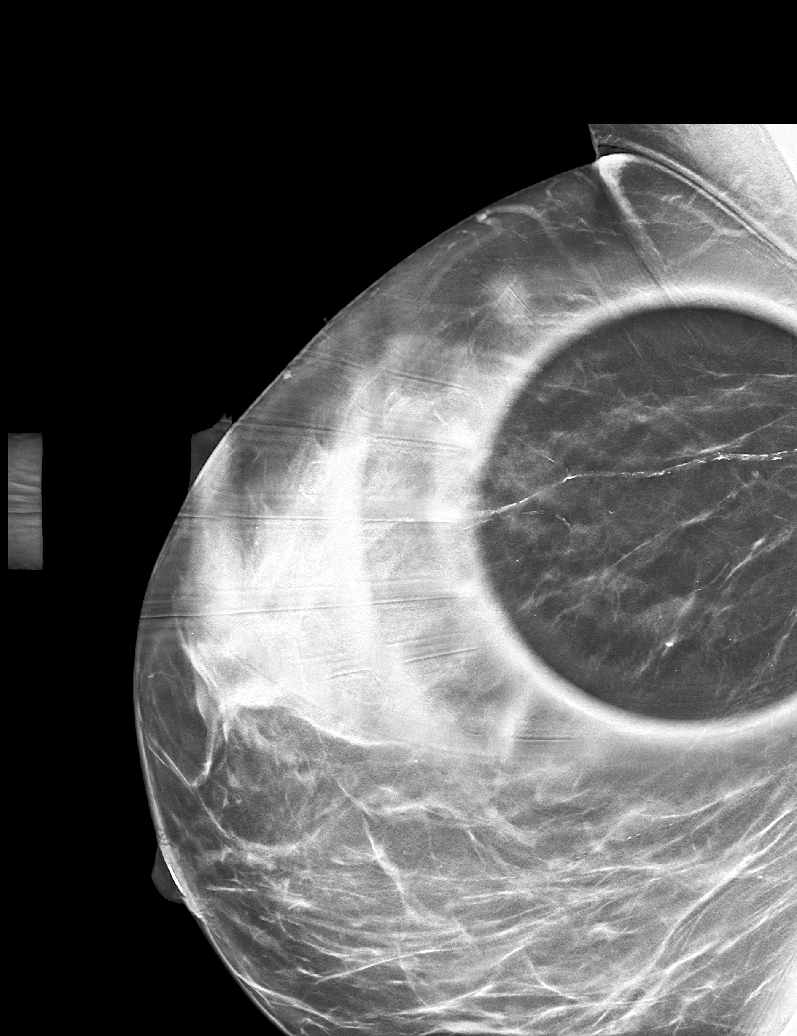

[R CC synth-2D (1 of 2)]
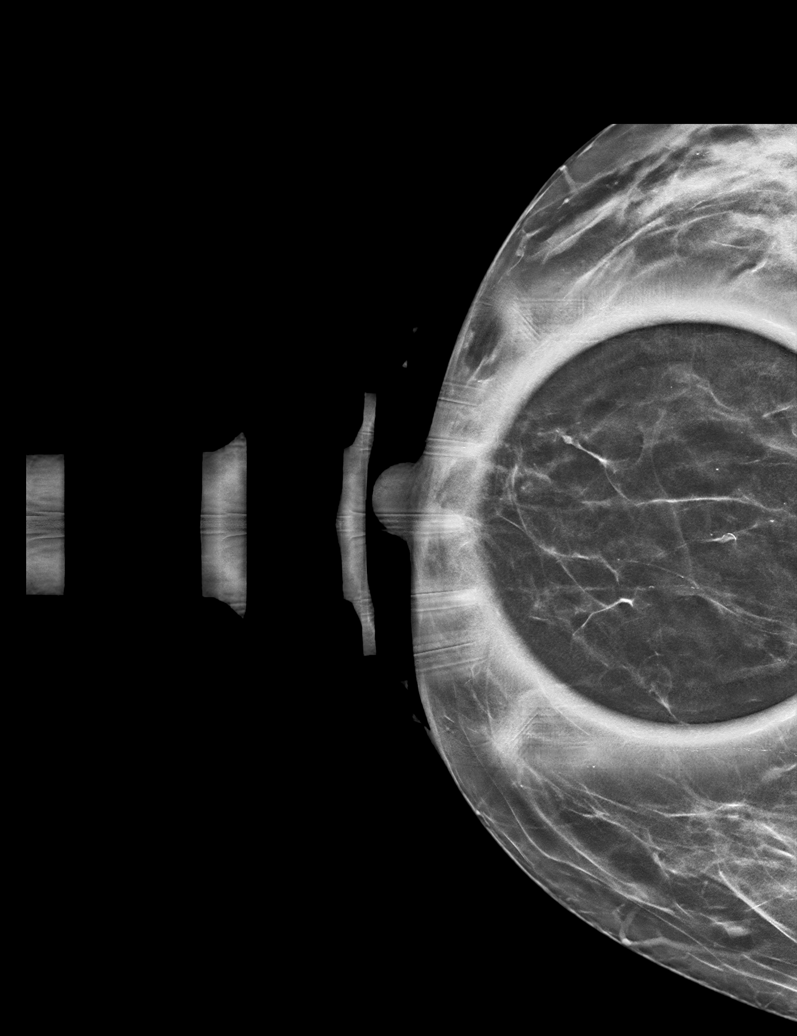

[R CC synth-2D (2 of 2)]
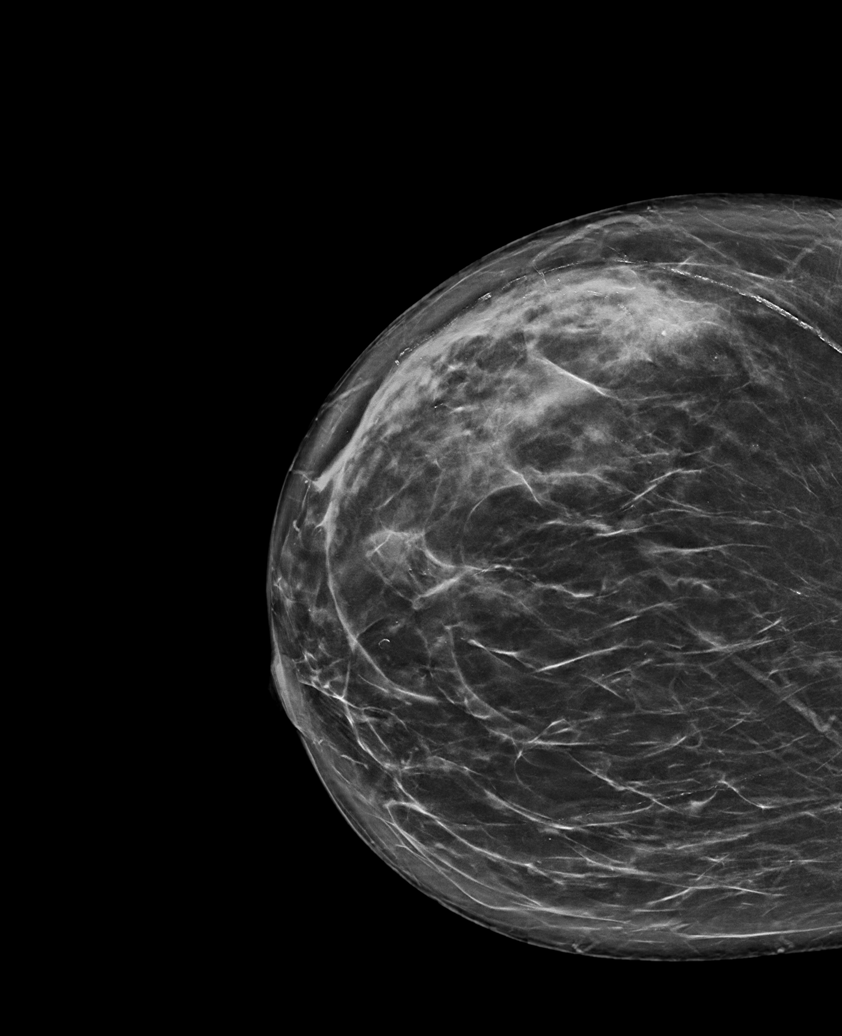

[6 of 36 positions shown; findings below may reference images not displayed]

ACR Breast Density Category b: There are scattered areas of
fibroglandular density.
FINDINGS: Two asymmetries in the right breast resolve on additional imaging.
Stable benign calcifications are seen on the left. No suspicious
findings are seen in either breast.

Mammographic images were processed with CAD.

On physical exam, no suspicious lumps are identified.

Targeted ultrasound is performed, showing no sonographic correlate
to the patient's focal pain in the superior right chest wall above
the breast.
IMPRESSION: No mammographic or sonographic evidence of malignancy.

RECOMMENDATION:
Treatment of the patient's focal pain should be based on clinical
and physical exam given lack of imaging findings. The region of pain
is higher than could be evaluated with mammography. The ultrasound
was normal in this region. If clinical concern persists, a CT scan
or MRI of the chest wall could better evaluate this region.
Recommend annual screening mammography.

I have discussed the findings and recommendations with the patient.
Results were also provided in writing at the conclusion of the
visit. If applicable, a reminder letter will be sent to the patient
regarding the next appointment.

BI-RADS CATEGORY  2: Benign.

## 2021-03-09 IMAGING — US ULTRASOUND RIGHT BREAST LIMITED
1 series · 3 of 3 positions shown · non-contrast
Comparison: Previous exam(s).

CLINICAL DATA: Palpable lump in the right chest wall above the
breast.

EXAM:
DIGITAL DIAGNOSTIC BILATERAL MAMMOGRAM WITH CAD AND TOMO
ULTRASOUND RIGHT BREAST

[Series 1: ultrasound right breast limited · 0.06mm/px · 3 of 3 slices shown]
[im 1/3]
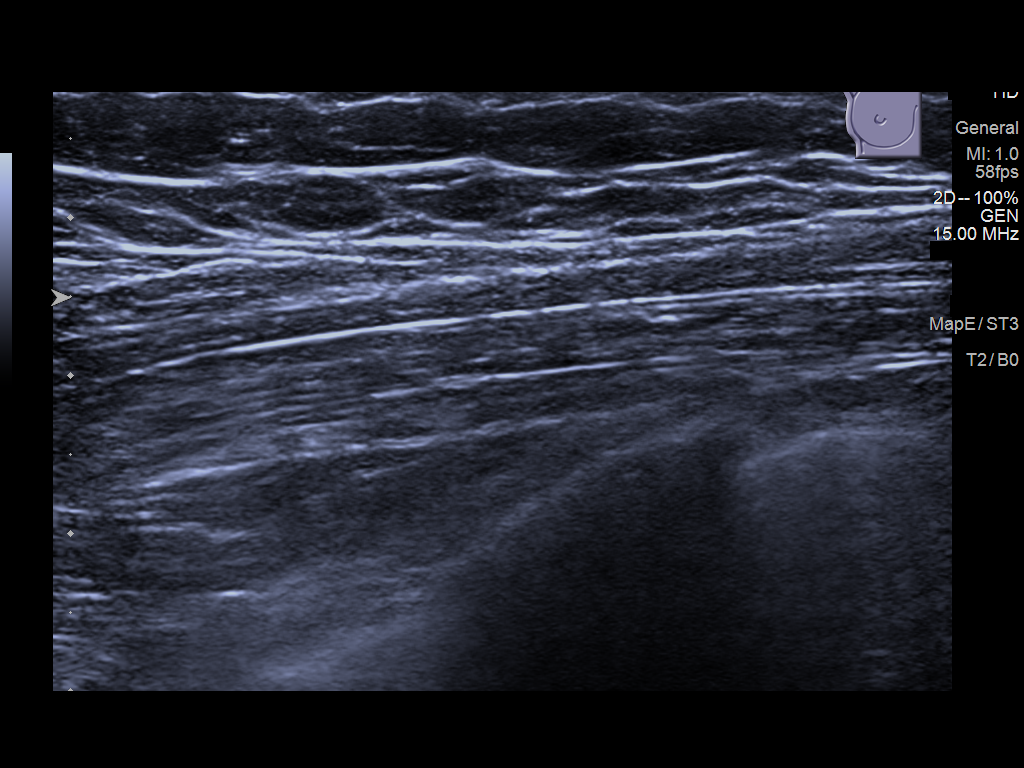
[im 2/3]
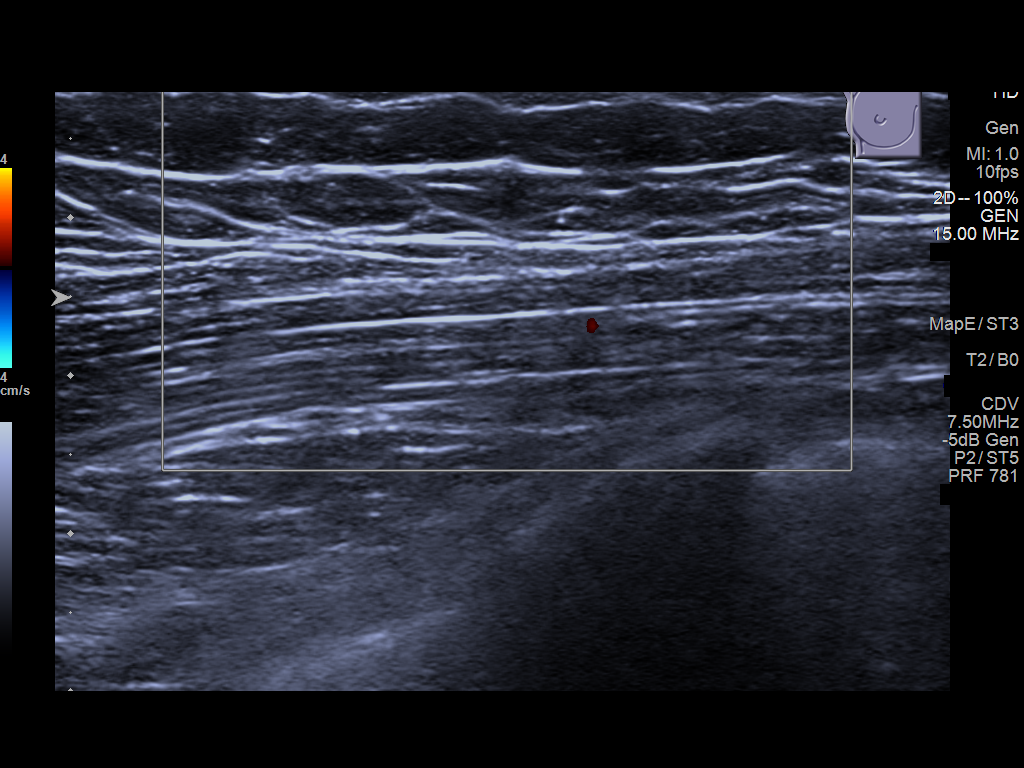
[im 3/3]
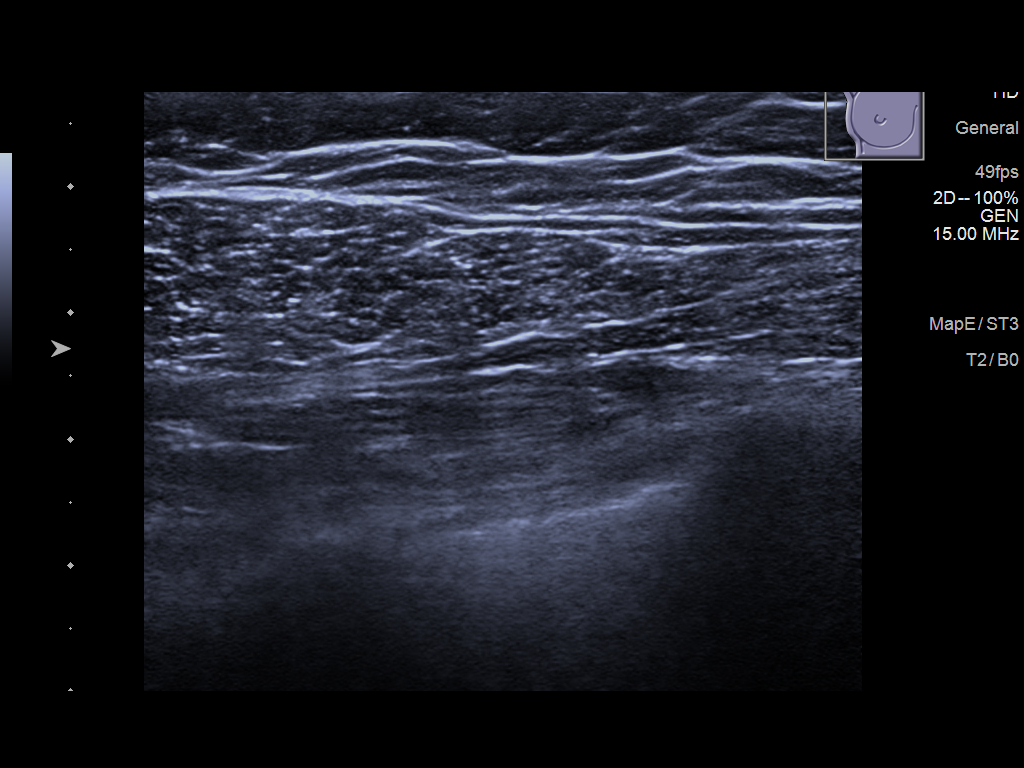

[3 of 3 positions shown; findings below may reference images not displayed]

ACR Breast Density Category b: There are scattered areas of
fibroglandular density.
FINDINGS: Two asymmetries in the right breast resolve on additional imaging.
Stable benign calcifications are seen on the left. No suspicious
findings are seen in either breast.

Mammographic images were processed with CAD.

On physical exam, no suspicious lumps are identified.

Targeted ultrasound is performed, showing no sonographic correlate
to the patient's focal pain in the superior right chest wall above
the breast.
IMPRESSION: No mammographic or sonographic evidence of malignancy.

RECOMMENDATION:
Treatment of the patient's focal pain should be based on clinical
and physical exam given lack of imaging findings. The region of pain
is higher than could be evaluated with mammography. The ultrasound
was normal in this region. If clinical concern persists, a CT scan
or MRI of the chest wall could better evaluate this region.
Recommend annual screening mammography.

I have discussed the findings and recommendations with the patient.
Results were also provided in writing at the conclusion of the
visit. If applicable, a reminder letter will be sent to the patient
regarding the next appointment.

BI-RADS CATEGORY  2: Benign.

## 2021-03-10 ENCOUNTER — Telehealth: Payer: Self-pay | Admitting: Urology

## 2021-03-10 ENCOUNTER — Encounter: Payer: Self-pay | Admitting: Urology

## 2021-03-10 DIAGNOSIS — N200 Calculus of kidney: Secondary | ICD-10-CM

## 2021-03-10 NOTE — Telephone Encounter (Signed)
Please advise 

## 2021-03-10 NOTE — Telephone Encounter (Signed)
I just sent her a MyChart message.  You can see if she has any questions.

## 2021-03-10 NOTE — Telephone Encounter (Signed)
Pt called asking about CT results from 8/10.

## 2021-03-13 MED ORDER — TAMSULOSIN HCL 0.4 MG PO CAPS: 0.4000 mg | ORAL_CAPSULE | Freq: Every day | ORAL | 0 refills | Status: DC

## 2021-04-20 ENCOUNTER — Other Ambulatory Visit: Payer: Self-pay

## 2021-04-20 ENCOUNTER — Ambulatory Visit
Payer: Medicare Other | Attending: Student in an Organized Health Care Education/Training Program | Admitting: Student in an Organized Health Care Education/Training Program

## 2021-04-20 ENCOUNTER — Encounter: Payer: Self-pay | Admitting: Student in an Organized Health Care Education/Training Program

## 2021-04-20 VITALS — BP 122/91 | HR 102 | Temp 97.2°F | Resp 18 | Ht 61.0 in | Wt 143.0 lb

## 2021-04-20 DIAGNOSIS — G894 Chronic pain syndrome: Secondary | ICD-10-CM

## 2021-04-20 DIAGNOSIS — M7918 Myalgia, other site: Secondary | ICD-10-CM | POA: Diagnosis not present

## 2021-04-20 DIAGNOSIS — M47812 Spondylosis without myelopathy or radiculopathy, cervical region: Secondary | ICD-10-CM

## 2021-04-20 DIAGNOSIS — M5030 Other cervical disc degeneration, unspecified cervical region: Secondary | ICD-10-CM | POA: Diagnosis present

## 2021-04-20 DIAGNOSIS — Z9889 Other specified postprocedural states: Secondary | ICD-10-CM | POA: Diagnosis not present

## 2021-04-20 DIAGNOSIS — M5481 Occipital neuralgia: Secondary | ICD-10-CM

## 2021-04-20 DIAGNOSIS — Q761 Klippel-Feil syndrome: Secondary | ICD-10-CM | POA: Diagnosis not present

## 2021-04-20 MED ORDER — OXYCODONE HCL 5 MG PO TABS
5.0000 mg | ORAL_TABLET | Freq: Four times a day (QID) | ORAL | 0 refills | Status: DC | PRN
Start: 2021-04-20 — End: 2021-06-15

## 2021-04-20 MED ORDER — OXYCODONE HCL 5 MG PO TABS: 5.0000 mg | ORAL_TABLET | Freq: Four times a day (QID) | ORAL | 0 refills | Status: DC | PRN

## 2021-04-20 NOTE — Progress Notes (Signed)
Nursing Pain Medication Assessment:  Safety precautions to be maintained throughout the outpatient stay will include: orient to surroundings, keep bed in low position, maintain call bell within reach at all times, provide assistance with transfer out of bed and ambulation.  Medication Inspection Compliance: Pill count conducted under aseptic conditions, in front of the patient. Neither the pills nor the bottle was removed from the patient's sight at any time. Once count was completed pills were immediately returned to the patient in their original bottle.  Medication: Oxycodone IR Pill/Patch Count:  18 of 75 pills remain Pill/Patch Appearance: Markings consistent with prescribed medication Bottle Appearance: Standard pharmacy container. Clearly labeled. Filled Date: 08 / 17 / 2022 Last Medication intake:  Today

## 2021-04-20 NOTE — Progress Notes (Signed)
PROVIDER NOTE: Information contained herein reflects review and annotations entered in association with encounter. Interpretation of such information and data should be left to medically-trained personnel. Information provided to patient can be located elsewhere in the medical record under "Patient Instructions". Document created using STT-dictation technology, any transcriptional errors that may result from process are unintentional.    Patient: Kimberly [name]  Service Category: E/M  Provider: Gillis Santa, MD  DOB: [birth year]  DOS: 04/20/2021  Specialty: Interventional Pain Management  MRN: [id number]  Setting: Ambulatory outpatient  PCP: Dion Body, MD  Type: Established Patient    Referring Provider: Dion Body, MD  Location: Office  Delivery: Face-to-face     HPI  [name], a 60 y.o. year old female, is here today because of her Cervical spondylosis without myelopathy [M47.812]. [name] primary complain today is Headache Last encounter: My last encounter with her was on 02/23/21 Pertinent problems: [name] has SHOULDER PAIN; IMPINGEMENT SYNDROME; RUPTURE ROTATOR CUFF; CTS (carpal tunnel syndrome); Cervical spondylosis without myelopathy; Cervical stenosis of spinal canal; Pseudoarthrosis of cervical spine ([place]); Numbness and tingling; Vitamin D deficiency; Cervical fusion syndrome; Hx of cervical spine surgery; Chronic pain syndrome; Cervicalgia; Controlled substance agreement signed; Depression, major, single episode, complete remission ([place]); Cervical myofascial pain syndrome; and Occipital headache on their pertinent problem list. Pain Assessment: Severity of Chronic pain is reported as a 8 /10. Location: Head Posterior/denies. Onset: More than a month ago. Quality: Constant. Timing:  . Modifying factor(s): heat, medications, lying down. Vitals:  height is _0  (1.549 m) and weight is 143 lb (64.9 kg). Her temporal temperature is 97.2 F (36.2 C) (abnormal).  Her blood pressure is 122/91 (abnormal) and her pulse is 102 (abnormal). Her respiration is 18 and oxygen saturation is 98%.   Reason for encounter: medication management.    Kimberly [name] presents today for medication management.  No change in medical history since last visit.  Patient's pain is at baseline.  Patient continues multimodal pain regimen as prescribed.  States that it provides pain relief and improvement in functional status. Patient states that she would like to take her medications at a more frequent interval.  I recommend decreasing her dose to 5 mg every 6 hours as needed from 10 mg every 12 hours, equal analgesic MME.  Patient in agreement with plan.  I will follow-up with her in 2 months to see how she is doing. Pharmacotherapy Assessment  Analgesic: Oxycodone 5 mg every 6 hours as needed, quantity 120/month; MME equals 30    Monitoring: [place] PMP: PDMP reviewed during this encounter.       Pharmacotherapy: No side-effects or adverse reactions reported. Compliance: No problems identified. Effectiveness: Clinically acceptable.  UDS:  Summary  Date Value Ref Range Status  02/08/2021 Note  Final    Comment:    ==================================================================== [employer] [facility] 13 (MW) ==================================================================== Test                             Result       Flag       Units  Drug Present and Declared for Prescription Verification   Oxycodone                      1168         EXPECTED   ng/mg creat   Oxymorphone  2167         EXPECTED   ng/mg creat   Noroxycodone                   1622         EXPECTED   ng/mg creat   Noroxymorphone                 560          EXPECTED   ng/mg creat    Sources of oxycodone are scheduled prescription medications.    Oxymorphone, noroxycodone, and noroxymorphone are expected    metabolites of oxycodone. Oxymorphone is also available as a    scheduled prescription  medication.  Drug Absent but Declared for Prescription Verification   Diazepam                       Not Detected UNEXPECTED ng/mg creat   Alprazolam                     Not Detected UNEXPECTED ng/mg creat ==================================================================== Test                      Result    Flag   Units      Ref Range   Creatinine              183              mg/dL      >=20 ==================================================================== Declared Medications:  The flagging and interpretation on this report are based on the  following declared medications.  Unexpected results may arise from  inaccuracies in the declared medications.   **Note: The testing scope of this panel includes these medications:   Alprazolam (Xanax)  Diazepam (Valium)  Oxycodone   **Note: The testing scope of this panel does not include the  following reported medications:   Acetaminophen (Tylenol)  Acyclovir  Amlodipine  Cholecalciferol  Duloxetine (Cymbalta)  Escitalopram (Lexapro)  Estradiol (Estrace)  Eye Drop  Gabapentin  Liothyronine (Cytomel)  Nortriptyline (Pamelor)  Nystatin  Pantoprazole (Protonix) ==================================================================== For clinical consultation, please call [phone]. ====================================================================       ROS  Constitutional: Denies any fever or chills +headaches, occipital dominant Gastrointestinal: No reported hemesis, hematochezia, vomiting, or acute GI distress Musculoskeletal: Denies any acute onset joint swelling, redness, loss of ROM, or weakness Neurological: No reported episodes of acute onset apraxia, aphasia, dysarthria, agnosia, amnesia, paralysis, loss of coordination, or loss of consciousness  Medication Review  ALPRAZolam, Cholecalciferol, DULoxetine, Tetrahydrozoline HCl, acetaminophen, acyclovir, amLODipine, diazepam, escitalopram, estradiol, gabapentin,  liothyronine, meloxicam, methocarbamol, nortriptyline, nystatin, oxyCODONE, pantoprazole, and tamsulosin  History Review  Allergy: [name] is allergic to shellfish allergy, peanut-containing drug products, codeine, diphtheria toxoid, and tetanus toxoids. Drug: [name]  reports no history of drug use. Alcohol:  reports no history of alcohol use. Tobacco:  reports that she has never smoked. She has never used smokeless tobacco. Social: [name]  reports that she has never smoked. She has never used smokeless tobacco. She reports that she does not drink alcohol and does not use drugs. Medical:  has a past medical history of Anxiety, Arthritis, Asthma, Balance problem, Brain cyst, Depression, Difficult intubation, Dyspnea, Flu (08/10/2018), GERD (gastroesophageal reflux disease), Headache, Herpes, History of kidney stones, History of pneumonia, Hypertension, Inflammation of shoulder joint, Memory difficulties, Neuromuscular disorder ([place]), Pneumothorax on right, PONV (postoperative nausea and vomiting), Recurrent falls, and Urinary frequency.  Surgical: Ms. Tuch  has a past surgical history that includes Abdominal hysterectomy; Rotator cuff repair (Left); Foreign Body Removal (Left); Carpal tunnel release (Left, 02/16/2013); Carpal tunnel release (Right, 03/27/2013); Cesarean section; Anterior cervical decomp/discectomy fusion (N/A, 08/06/2014); Colonoscopy; Nasal sinus surgery (N/A, 04/13/2015); Shoulder arthroscopy with distal clavicle resection (Left, 05/18/2015); Shoulder acromioplasty (Left, 05/18/2015); Cholecystectomy (N/A, 06/17/2015); Neck surgery (2016); Anterior cervical decomp/discectomy fusion (N/A, 07/04/2016); Posterior cervical fusion/foraminotomy (N/A, 11/13/2016); Knee arthroscopy; Extracorporeal shock wave lithotripsy (Left, 09/04/2018); Colonoscopy with propofol (N/A, 05/06/2019); and Reduction mammaplasty. Family: family history includes Hypertension in her father and another family  member.  Laboratory Chemistry Profile   Renal Lab Results  Component Value Date   BUN 9 07/01/2019   CREATININE 0.86 07/01/2019   BCR 10 07/01/2019   GFRAA 86 07/01/2019   GFRNONAA 75 07/01/2019     Hepatic Lab Results  Component Value Date   AST 26 07/31/2015   ALT 17 07/31/2015   ALBUMIN 4.1 07/31/2015   ALKPHOS 93 07/31/2015   LIPASE 23 07/15/2017     Electrolytes Lab Results  Component Value Date   NA 144 07/01/2019   K 4.8 07/01/2019   CL 103 07/01/2019   CALCIUM 10.3 (H) 07/01/2019     Bone No results found for: VD25OH, VD125OH2TOT, EM7544BE0, FE0712RF7, 25OHVITD1, 25OHVITD2, 25OHVITD3, TESTOFREE, TESTOSTERONE   Inflammation (CRP: Acute Phase) (ESR: Chronic Phase) No results found for: CRP, ESRSEDRATE, LATICACIDVEN     Note: Above Lab results reviewed.  Recent Imaging Review  CT RENAL STONE STUDY CLINICAL DATA:  Left flank pain and left lower quadrant pain for 3 months. Nephrolithiasis.  EXAM: CT ABDOMEN AND PELVIS WITHOUT CONTRAST  TECHNIQUE: Multidetector CT imaging of the abdomen and pelvis was performed following the standard protocol without IV contrast.  COMPARISON:  08/04/2018  FINDINGS: Lower chest: No acute findings.  Hepatobiliary: No mass visualized on this unenhanced exam. Prior cholecystectomy. No evidence of biliary obstruction.  Pancreas: No mass or inflammatory process visualized on this unenhanced exam.  Spleen:  Within normal limits in size.  Adrenals/Urinary tract: No evidence of urolithiasis or hydronephrosis. Unremarkable unopacified urinary bladder.  Stomach/Bowel: No evidence of obstruction, inflammatory process, or abnormal fluid collections.  Vascular/Lymphatic: No pathologically enlarged lymph nodes identified. No evidence of abdominal aortic aneurysm.  Reproductive: Prior hysterectomy. Cystic lesion in the left adnexa measures 4.7 x 2.9 cm, without significant change since prior study, consistent with benign  etiology. No evidence of inflammatory process or free fluid.  Other:  None.  Musculoskeletal:  No suspicious bone lesions identified.  IMPRESSION: No evidence of urolithiasis, hydronephrosis, or other acute findings.  Stable cystic lesion in left adnexa, consistent with benign etiology.  Electronically Signed   By: Marlaine Hind M.D.   On: 03/09/2021 14:19 Note: Reviewed        Physical Exam  General appearance: Well nourished, well developed, and well hydrated. In no apparent acute distress Mental status: Alert, oriented x 3 (person, place, & time)       Respiratory: No evidence of acute respiratory distress Eyes: PERLA Vitals: BP (!) 122/91   Pulse (!) 102   Temp (!) 97.2 F (36.2 C) (Temporal)   Resp 18   Ht _0  (1.549 m)   Wt 143 lb (64.9 kg)   SpO2 98%   BMI 27.02 kg/m  BMI: Estimated body mass index is 27.02 kg/m as calculated from the following:   Height as of this encounter: _1  (1.549 m).   Weight as of this encounter:  143 lb (64.9 kg). Ideal: Ideal body weight: 47.8 kg (105 lb 6.1 oz) Adjusted ideal body weight: 54.6 kg (120 lb 6.8 oz)  Cervical Spine Exam  Skin & Axial Inspection: Well healed scar from previous spine surgery detected Alignment: Symmetrical Functional ROM: Pain restricted ROM      Stability: No instability detected Muscle Tone/Strength: Functionally intact. No obvious neuro-muscular anomalies detected. Sensory (Neurological): Neurogenic pain pattern Palpation: No palpable anomalies                          Upper Extremity (UE) Exam      Side: Right upper extremity   Side: Left upper extremity    Skin & Extremity Inspection: Skin color, temperature, and hair growth are WNL. No peripheral edema or cyanosis. No masses, redness, swelling, asymmetry, or associated skin lesions. No contractures.   Skin & Extremity Inspection: Skin color, temperature, and hair growth are WNL. No peripheral edema or cyanosis. No masses, redness, swelling,  asymmetry, or associated skin lesions. No contractures.    Functional ROM: Unrestricted ROM           Functional ROM: Unrestricted ROM            Muscle Tone/Strength: Functionally intact. No obvious neuro-muscular anomalies detected.   Muscle Tone/Strength: Functionally intact. No obvious neuro-muscular anomalies detected.    Sensory (Neurological): Unimpaired           Sensory (Neurological): Unimpaired            Palpation: No palpable anomalies               Palpation: No palpable anomalies                Provocative Test(s):  Phalen's test: deferred Tinel's test: deferred Apley's scratch test (touch opposite shoulder):  Action 1 (Across chest): deferred Action 2 (Overhead): deferred Action 3 (LB reach): deferred     Provocative Test(s):  Phalen's test: deferred Tinel's test: deferred Apley's scratch test (touch opposite shoulder):  Action 1 (Across chest): deferred Action 2 (Overhead): deferred Action 3 (LB reach): deferred       Assessment   Status Diagnosis  Controlled Controlled Worsening 1. Cervical spondylosis without myelopathy   2. Hx of cervical spine surgery   3. Cervical myofascial pain syndrome   4. Cervical fusion syndrome (C2-T1)   5. Bilateral occipital neuralgia   6. Degenerative disc disease, cervical   7. Cervical facet joint syndrome   8. Chronic pain syndrome        Plan of Care   [name] has a current medication list which includes the following long-term medication(s): escitalopram, liothyronine, nortriptyline, oxycodone, and [START ON 05/20/2021] oxycodone.  Pharmacotherapy (Medications Ordered): Meds ordered this encounter  Medications   oxyCODONE (OXY IR/ROXICODONE) 5 MG immediate release tablet    Sig: Take 1 tablet (5 mg total) by mouth every 6 (six) hours as needed for severe pain. Must last 30 days.    Dispense:  120 tablet    Refill:  0    Chronic Pain: STOP Act (Not applicable) Fill 1 day early if closed on refill date.  Avoid benzodiazepines within 8 hours of opioids   oxyCODONE (OXY IR/ROXICODONE) 5 MG immediate release tablet    Sig: Take 1 tablet (5 mg total) by mouth every 6 (six) hours as needed for severe pain. Must last 30 days.    Dispense:  120 tablet    Refill:  0  Chronic Pain: STOP Act (Not applicable) Fill 1 day early if closed on refill date. Avoid benzodiazepines within 8 hours of opioids    Follow-up plan:   Return in about 8 weeks (around 06/15/2021) for Medication Management, in person.     cervical TPI, cervical facet medial branch nerve block, suprascapular nerve block, bilateral C4, C5, C6   cervical facet medial branch nerve block 07/04/2020: Not effective, do not repeat.        Recent Visits Date Type Provider Dept  02/23/21 Telemedicine Gillis Santa, MD [facility]  01/24/21 Office Visit Gillis Santa, MD [facility]  Showing recent visits within past 90 days and meeting all other requirements Today's Visits Date Type Provider Dept  04/20/21 Office Visit Gillis Santa, MD [facility]  Showing today's visits and meeting all other requirements Future Appointments Date Type Provider Dept  06/15/21 Appointment Gillis Santa, MD [facility]  Showing future appointments within next 90 days and meeting all other requirements I discussed the assessment and treatment plan with the patient. The patient was provided an opportunity to ask questions and all were answered. The patient agreed with the plan and demonstrated an understanding of the instructions.  Patient advised to call back or seek an in-person evaluation if the symptoms or condition worsens.  Duration of encounter: 30 minutes.  Note by: Gillis Santa, MD Date: 04/20/2021; Time: 12:58 PM

## 2021-04-27 ENCOUNTER — Telehealth: Payer: Self-pay

## 2021-04-27 NOTE — Telephone Encounter (Signed)
Pt. says her meds have been reduced to oxy 5mg  and for the last week she has been in a tremendous amount of pain and that oxy 5mg  does not help at all and she is in constant pain and is unsure of what to do. Pain radiates up into her neck and on to her head.

## 2021-04-27 NOTE — Telephone Encounter (Signed)
Naydene Kamrowski will need to make an appt with Dr Holley Raring to discuss this

## 2021-05-04 ENCOUNTER — Telehealth: Payer: Self-pay

## 2021-05-04 NOTE — Telephone Encounter (Signed)
Error

## 2021-05-08 ENCOUNTER — Ambulatory Visit: Payer: Medicare Other | Admitting: Anesthesiology

## 2021-05-08 ENCOUNTER — Ambulatory Visit
Admission: RE | Admit: 2021-05-08 | Discharge: 2021-05-08 | Disposition: A | Discharge status: Home / Self Care | Payer: Medicare Other | Attending: Gastroenterology | Admitting: Gastroenterology

## 2021-05-08 ENCOUNTER — Encounter
Admission: RE | Disposition: A | Discharge status: Home / Self Care | Payer: Self-pay | Source: Home / Self Care | Attending: Gastroenterology

## 2021-05-08 ENCOUNTER — Encounter: Payer: Self-pay | Admitting: *Deleted

## 2021-05-08 DIAGNOSIS — Z887 Allergy status to serum and vaccine status: Secondary | ICD-10-CM | POA: Diagnosis not present

## 2021-05-08 DIAGNOSIS — Z79899 Other long term (current) drug therapy: Secondary | ICD-10-CM | POA: Diagnosis not present

## 2021-05-08 DIAGNOSIS — Z791 Long term (current) use of non-steroidal anti-inflammatories (NSAID): Secondary | ICD-10-CM | POA: Insufficient documentation

## 2021-05-08 DIAGNOSIS — K449 Diaphragmatic hernia without obstruction or gangrene: Secondary | ICD-10-CM | POA: Diagnosis not present

## 2021-05-08 DIAGNOSIS — Z91013 Allergy to seafood: Secondary | ICD-10-CM | POA: Insufficient documentation

## 2021-05-08 DIAGNOSIS — K219 Gastro-esophageal reflux disease without esophagitis: Secondary | ICD-10-CM | POA: Insufficient documentation

## 2021-05-08 DIAGNOSIS — Z91010 Allergy to peanuts: Secondary | ICD-10-CM | POA: Diagnosis not present

## 2021-05-08 DIAGNOSIS — Z885 Allergy status to narcotic agent status: Secondary | ICD-10-CM | POA: Insufficient documentation

## 2021-05-08 DIAGNOSIS — Z981 Arthrodesis status: Secondary | ICD-10-CM | POA: Diagnosis not present

## 2021-05-08 DIAGNOSIS — K222 Esophageal obstruction: Secondary | ICD-10-CM | POA: Insufficient documentation

## 2021-05-08 DIAGNOSIS — Z9049 Acquired absence of other specified parts of digestive tract: Secondary | ICD-10-CM | POA: Insufficient documentation

## 2021-05-08 DIAGNOSIS — R1310 Dysphagia, unspecified: Secondary | ICD-10-CM | POA: Insufficient documentation

## 2021-05-08 HISTORY — DX: Occipital neuralgia: M54.81

## 2021-05-08 HISTORY — DX: Fracture of neck, unspecified, initial encounter: S12.9XXA

## 2021-05-08 HISTORY — DX: Klippel-Feil syndrome: Q76.1

## 2021-05-08 HISTORY — PX: ESOPHAGOGASTRODUODENOSCOPY: SHX5428

## 2021-05-08 HISTORY — DX: Complete rotator cuff tear or rupture of unspecified shoulder, not specified as traumatic: M75.120

## 2021-05-08 HISTORY — DX: Syncope and collapse: R55

## 2021-05-08 SURGERY — EGD (ESOPHAGOGASTRODUODENOSCOPY)
Anesthesia: Monitor Anesthesia Care

## 2021-05-08 MED ORDER — PROPOFOL 500 MG/50ML IV EMUL
INTRAVENOUS | Status: AC
  Filled 2021-05-08: qty 50

## 2021-05-08 MED ORDER — SODIUM CHLORIDE 0.9 % IV SOLN
INTRAVENOUS | Status: DC
  Administered 2021-05-08: 1000 mL via INTRAVENOUS

## 2021-05-08 MED ORDER — ONDANSETRON HCL 4 MG/2ML IJ SOLN
INTRAMUSCULAR | Status: AC
  Filled 2021-05-08: qty 2

## 2021-05-08 MED ORDER — ONDANSETRON HCL 4 MG/2ML IJ SOLN
INTRAMUSCULAR | Status: DC | PRN
  Administered 2021-05-08: 4 mg via INTRAVENOUS

## 2021-05-08 MED ORDER — DEXAMETHASONE SODIUM PHOSPHATE 10 MG/ML IJ SOLN
INTRAMUSCULAR | Status: AC
  Filled 2021-05-08: qty 1

## 2021-05-08 MED ORDER — LIDOCAINE HCL (CARDIAC) PF 100 MG/5ML IV SOSY
PREFILLED_SYRINGE | INTRAVENOUS | Status: DC | PRN
  Administered 2021-05-08: 50 mg via INTRAVENOUS

## 2021-05-08 MED ORDER — LIDOCAINE HCL (PF) 2 % IJ SOLN
INTRAMUSCULAR | Status: AC
  Filled 2021-05-08: qty 5

## 2021-05-08 MED ORDER — PROPOFOL 500 MG/50ML IV EMUL
INTRAVENOUS | Status: DC | PRN
  Administered 2021-05-08: 150 ug/kg/min via INTRAVENOUS

## 2021-05-08 MED ORDER — DEXAMETHASONE SODIUM PHOSPHATE 10 MG/ML IJ SOLN
INTRAMUSCULAR | Status: DC | PRN
  Administered 2021-05-08: 5 mg via INTRAVENOUS

## 2021-05-08 NOTE — Anesthesia Procedure Notes (Signed)
Date/Time: 05/08/2021 1:10 PM Performed by: Vaughan Sine Pre-anesthesia Checklist: Patient identified, Emergency Drugs available, Suction available, Patient being monitored and Timeout performed Patient Re-evaluated:Patient Re-evaluated prior to induction Oxygen Delivery Method: Nasal cannula Preoxygenation: Pre-oxygenation with 100% oxygen Induction Type: IV induction Airway Equipment and Method: Bite block Placement Confirmation: positive ETCO2 and CO2 detector

## 2021-05-08 NOTE — H&P (Signed)
Outpatient short stay form Pre-procedure 05/08/2021  Lesly Rubenstein, MD  Primary Physician: Dion Body, MD  Reason for visit:  Dysphagia  History of present illness:   60 y/o lady with history of anterior neck fusions here for EGD for dysphagia. Required dilations in the past. Sometime she gets choked up on her liquids as well. No family history of GI malignancies. History of cholecystectomy.    Current Facility-Administered Medications:    0.9 %  sodium chloride infusion, , Intravenous, Continuous, Abdulai Blaylock, Hilton Cork, MD, Last Rate: 20 mL/hr at 05/08/21 1241, 1,000 mL at 05/08/21 1241  Medications Prior to Admission  Medication Sig Dispense Refill Last Dose   amLODipine (NORVASC) 5 MG tablet Take 5 mg by mouth daily.    05/08/2021 at 0630   chlorpheniramine-HYDROcodone (TUSSIONEX PENNKINETIC ER) 10-8 MG/5ML SUER Take 5 mLs by mouth.      ibuprofen (ADVIL) 200 MG tablet Take 200 mg by mouth every 6 (six) hours as needed.      rizatriptan (MAXALT) 10 MG tablet Take 10 mg by mouth as needed for migraine. May repeat in 2 hours if needed      valACYclovir (VALTREX) 1000 MG tablet Take 1,000 mg by mouth 2 (two) times daily.      acetaminophen (TYLENOL) 500 MG tablet Take 500 mg by mouth 2 (two) times daily as needed for headache.      acyclovir (ZOVIRAX) 400 MG tablet Take 400 mg by mouth 2 (two) times daily.       ALPRAZolam (XANAX) 0.5 MG tablet Take 0.5 mg by mouth 3 (three) times daily as needed for anxiety.      Cholecalciferol 25 MCG (1000 UT) tablet Take 1,000 Units by mouth daily.      diazepam (VALIUM) 10 MG tablet Take 10 mg by mouth daily as needed for anxiety. (Patient not taking: Reported on 04/20/2021)      DULoxetine (CYMBALTA) 30 MG capsule Take by mouth.      escitalopram (LEXAPRO) 10 MG tablet Take 10 mg by mouth daily as needed (anxiety).       estradiol (ESTRACE) 0.5 MG tablet Take 0.5 mg by mouth daily.      gabapentin (NEURONTIN) 300 MG capsule Take by  mouth.      liothyronine (CYTOMEL) 5 MCG tablet Take 1 tablet by mouth daily.    at 0630   meloxicam (MOBIC) 7.5 MG tablet Take 1 tablet by mouth 2 (two) times daily.      methocarbamol (ROBAXIN) 500 MG tablet Take 500 mg by mouth every 6 (six) hours as needed.       nortriptyline (PAMELOR) 10 MG capsule Take 1 capsule by mouth at bedtime as needed for sleep.       nystatin (MYCOSTATIN) 100000 UNIT/ML suspension Take 100,000 Units by mouth daily. Swish and spit  0    oxyCODONE (OXY IR/ROXICODONE) 5 MG immediate release tablet Take 1 tablet (5 mg total) by mouth every 6 (six) hours as needed for severe pain. Must last 30 days. 120 tablet 0    [START ON 05/20/2021] oxyCODONE (OXY IR/ROXICODONE) 5 MG immediate release tablet Take 1 tablet (5 mg total) by mouth every 6 (six) hours as needed for severe pain. Must last 30 days. 120 tablet 0    pantoprazole (PROTONIX) 40 MG tablet Take 40 mg by mouth daily.      tamsulosin (FLOMAX) 0.4 MG CAPS capsule Take 1 capsule (0.4 mg total) by mouth daily. 30 capsule 0  Tetrahydrozoline HCl (VISINE OP) Apply 1 drop to eye daily as needed (irritation).        Allergies  Allergen Reactions   Shellfish Allergy Anaphylaxis and Hives    "Swells throat up"   Peanut-Containing Drug Products Hives   Codeine Other (See Comments)    jitters   Diphtheria Toxoid Rash   Tetanus Toxoids Rash     Past Medical History:  Diagnosis Date   Anxiety    takes alprazolam - rare use    Arthritis    cervical spondylosis    Asthma    Balance problem    Brain cyst    Cervical fusion syndrome    Complete rupture of rotator cuff    Depression    Difficult intubation    Small mouth opening, limited neck flexion, very anterior    Dyspnea    Flu 08/10/2018   tested positive   GERD (gastroesophageal reflux disease)    pt. reports that its better, no meds in use at this time- 2015   Headache    Herpes    History of kidney stones    History of pneumonia     Hypertension    pt. doesn't see cardiologist, followed for HTN by Dr. Gerarda Fraction   Inflammation of shoulder joint    Memory difficulties    Neuromuscular disorder ([place])    joint and muscle problems   Occipital neuralgia    Pneumothorax on right    following C4-6 ACDF 07/04/16   PONV (postoperative nausea and vomiting)    WITH SURGERY 07/04/16 (NECK) RT LUNG COLLAPSED   Pseudoarthrosis of cervical spine ([facility])    Recurrent falls    Syncope    Urinary frequency     Review of systems:  Otherwise negative.    Physical Exam  Gen: Alert, oriented. Appears stated age.  HEENT: PERRLA. Lungs: No respiratory distress CV: RRR Abd: soft, benign, no masses Ext: No edema    Planned procedures: Proceed with EGD. The patient understands the nature of the planned procedure, indications, risks, alternatives and potential complications including but not limited to bleeding, infection, perforation, damage to internal organs and possible oversedation/side effects from anesthesia. The patient agrees and gives consent to proceed.  Please refer to procedure notes for findings, recommendations and patient disposition/instructions.     Lesly Rubenstein, MD [facility]

## 2021-05-08 NOTE — Anesthesia Postprocedure Evaluation (Signed)
Anesthesia Post Note  Patient: Kimberly [name]  Procedure(s) Performed: ESOPHAGOGASTRODUODENOSCOPY (EGD)  Patient location during evaluation: PACU Anesthesia Type: MAC Level of consciousness: awake and alert Pain management: pain level controlled Vital Signs Assessment: post-procedure vital signs reviewed and stable Respiratory status: spontaneous breathing, nonlabored ventilation, respiratory function stable and patient connected to nasal cannula oxygen Cardiovascular status: stable and blood pressure returned to baseline Postop Assessment: no apparent nausea or vomiting Anesthetic complications: no   No notable events documented.   Last Vitals:  Vitals:   05/08/21 1330 05/08/21 1340  BP: (!) 140/99 (!) 142/92  Pulse: 82 86  Resp: 20 20  Temp:    SpO2: 97% 100%    Last Pain:  Vitals:   05/08/21 1340  TempSrc:   PainSc: 0-No pain                 Tonny Bollman

## 2021-05-08 NOTE — Transfer of Care (Signed)
Immediate Anesthesia Transfer of Care Note  Patient: Kimberly [name]  Procedure(s) Performed: ESOPHAGOGASTRODUODENOSCOPY (EGD)  Patient Location: PACU  Anesthesia Type:General  Level of Consciousness: awake and sedated  Airway & Oxygen Therapy: Patient Spontanous Breathing and Patient connected to nasal cannula oxygen  Post-op Assessment: Report given to RN and Post -op Vital signs reviewed and stable  Post vital signs: Reviewed and stable  Last Vitals:  Vitals Value Taken Time  BP    Temp    Pulse    Resp    SpO2      Last Pain:  Vitals:   05/08/21 1218  TempSrc: Temporal  PainSc: 8       Patients Stated Pain Goal: 0 (75/17/00 1749)  Complications: No notable events documented.

## 2021-05-08 NOTE — Interval H&P Note (Signed)
History and Physical Interval Note:  05/08/2021 12:51 PM  Kimberly [name]  has presented today for surgery, with the diagnosis of DYSPHAGIA.  The various methods of treatment have been discussed with the patient and family. After consideration of risks, benefits and other options for treatment, the patient has consented to  Procedure(s): ESOPHAGOGASTRODUODENOSCOPY (EGD) (N/A) as a surgical intervention.  The patient's history has been reviewed, patient examined, no change in status, stable for surgery.  I have reviewed the patient's chart and labs.  Questions were answered to the patient's satisfaction.     Lesly Rubenstein  Ok to proceed with EGD

## 2021-05-08 NOTE — Op Note (Addendum)
[facility] Patient Name: Kimberly [name] Procedure Date: 05/08/2021 1:26 PM MRN: [id number] Account #: [id number] Date of Birth: 06-10-1961 Admit Type: Outpatient Age: 60 Room: [facility] ENDO ROOM 3 Gender: Female Note Status: Supervisor Override Instrument Name: Altamese Cabal Endoscope [id number] Procedure:             Upper GI endoscopy Indications:           Dysphagia Providers:             Andrey Farmer MD, MD Complications:         No immediate complications. Estimated blood loss:                         Minimal. Procedure:             Pre-Anesthesia Assessment:                        - Prior to the procedure, a History and Physical was                         performed, and patient medications and allergies were                         reviewed. The patient is competent. The risks and                         benefits of the procedure and the sedation options and                         risks were discussed with the patient. All questions                         were answered and informed consent was obtained.                         Patient identification and proposed procedure were                         verified by the physician, the nurse, the anesthetist                         and the technician in the endoscopy suite. Mental                         Status Examination: alert and oriented. Airway                         Examination: normal oropharyngeal airway and neck                         mobility. Respiratory Examination: clear to                         auscultation. CV Examination: normal. Prophylactic                         Antibiotics: The patient does not require prophylactic  antibiotics. Prior Anticoagulants: The patient has                         taken no previous anticoagulant or antiplatelet                         agents. ASA Grade Assessment: II - A patient with mild                         systemic  disease. After reviewing the risks and                         benefits, the patient was deemed in satisfactory                         condition to undergo the procedure. The anesthesia                         plan was to use monitored anesthesia care (MAC).                         Immediately prior to administration of medications,                         the patient was re-assessed for adequacy to receive                         sedatives. The heart rate, respiratory rate, oxygen                         saturations, blood pressure, adequacy of pulmonary                         ventilation, and response to care were monitored                         throughout the procedure. The physical status of the                         patient was re-assessed after the procedure.                        After obtaining informed consent, the endoscope was                         passed under direct vision. Throughout the procedure,                         the patient's blood pressure, pulse, and oxygen                         saturations were monitored continuously. The Endoscope                         was introduced through the mouth, and advanced to the                         second part of duodenum. The upper GI endoscopy was  accomplished without difficulty. The patient tolerated                         the procedure well. Findings:      No endoscopic abnormality was evident in the esophagus to explain the       patient's complaint of dysphagia. Biopsies were obtained from the       proximal and distal esophagus with cold forceps for histology of       suspected eosinophilic esophagitis. Estimated blood loss was minimal.      A widely patent Schatzki ring was found in the lower third of the       esophagus.      A 3 cm hiatal hernia was present.      The entire examined stomach was normal.      The examined duodenum was normal. Impression:            - No endoscopic  esophageal abnormality to explain                         patient's dysphagia. Biopsied.                        - Widely patent Schatzki ring.                        - 3 cm hiatal hernia.                        - Normal stomach.                        - Normal examined duodenum. Recommendation:        - Discharge patient to home.                        - Resume previous diet.                        - Continue present medications.                        - Await pathology results.                        - Return to referring physician as previously                         scheduled. Procedure Code(s):     --- Professional ---                        [phone], Esophagogastroduodenoscopy, flexible,                         transoral; with biopsy, single or multiple Diagnosis Code(s):     --- Professional ---                        R13.10, Dysphagia, unspecified                        K22.2, Esophageal obstruction  K44.9, Diaphragmatic hernia without obstruction or                         gangrene CPT copyright 2019 [employer]. All rights reserved. The codes documented in this report are preliminary and upon coder review may  be revised to meet current compliance requirements. Andrey Farmer MD, MD 05/08/2021 1:36:08 PM Number of Addenda: 0 Note Initiated On: 05/08/2021 1:26 PM Estimated Blood Loss:  Estimated blood loss was minimal.      [facility]

## 2021-05-08 NOTE — Anesthesia Preprocedure Evaluation (Addendum)
Anesthesia Evaluation  Patient identified by MRN, date of birth, ID band Patient awake    Reviewed: Allergy & Precautions, NPO status , Patient's Chart, lab work & pertinent test results  History of Anesthesia Complications (+) PONV, DIFFICULT AIRWAY and history of anesthetic complications (Small AW)  Airway Mallampati: II  TM Distance: >3 FB Neck ROM: Limited  Mouth opening: Limited Mouth Opening  Dental no notable dental hx. (+) Teeth Intact   Pulmonary shortness of breath, asthma ,    Pulmonary exam normal breath sounds clear to auscultation       Cardiovascular Exercise Tolerance: Good METS: 3 - Mets hypertension (Controlled), Pt. on medications Normal cardiovascular exam Rhythm:Regular Rate:Normal     Neuro/Psych  Headaches, PSYCHIATRIC DISORDERS Anxiety Depression    GI/Hepatic Neg liver ROS, GERD  Medicated and Controlled,  Endo/Other  Hypothyroidism   Renal/GU negative Renal ROS  negative genitourinary   Musculoskeletal  (+) Arthritis ,   Abdominal   Peds  Hematology negative hematology ROS (+)   Anesthesia Other Findings   Reproductive/Obstetrics negative OB ROS                            Anesthesia Physical Anesthesia Plan  ASA: 2  Anesthesia Plan: MAC   Post-op Pain Management:    Induction: Intravenous  PONV Risk Score and Plan:   Airway Management Planned: Natural Airway and Nasal Cannula  Additional Equipment:   Intra-op Plan:   Post-operative Plan:   Informed Consent: I have reviewed the patients History and Physical, chart, labs and discussed the procedure including the risks, benefits and alternatives for the proposed anesthesia with the patient or authorized representative who has indicated his/her understanding and acceptance.     Dental Advisory Given  Plan Discussed with: Anesthesiologist, CRNA and Surgeon  Anesthesia Plan Comments: (Patient  consented for risks of anesthesia including but not limited to:  - adverse reactions to medications - damage to eyes, teeth, lips or other oral mucosa - nerve damage due to positioning  - sore throat or hoarseness - Damage to heart, brain, nerves, lungs, other parts of body or loss of life  Patient voiced understanding.)        Anesthesia Quick Evaluation

## 2021-05-09 ENCOUNTER — Encounter: Payer: Self-pay | Admitting: Gastroenterology

## 2021-05-09 LAB — SURGICAL PATHOLOGY

## 2021-06-15 ENCOUNTER — Other Ambulatory Visit: Payer: Self-pay

## 2021-06-15 ENCOUNTER — Ambulatory Visit
Payer: Medicare Other | Attending: Student in an Organized Health Care Education/Training Program | Admitting: Student in an Organized Health Care Education/Training Program

## 2021-06-15 ENCOUNTER — Encounter: Payer: Self-pay | Admitting: Student in an Organized Health Care Education/Training Program

## 2021-06-15 VITALS — BP 120/92 | HR 88 | Temp 97.2°F | Resp 16 | Ht 61.0 in | Wt 143.0 lb

## 2021-06-15 DIAGNOSIS — M7918 Myalgia, other site: Secondary | ICD-10-CM | POA: Insufficient documentation

## 2021-06-15 DIAGNOSIS — M5481 Occipital neuralgia: Secondary | ICD-10-CM | POA: Diagnosis present

## 2021-06-15 DIAGNOSIS — Q761 Klippel-Feil syndrome: Secondary | ICD-10-CM | POA: Diagnosis not present

## 2021-06-15 DIAGNOSIS — Z9889 Other specified postprocedural states: Secondary | ICD-10-CM | POA: Diagnosis not present

## 2021-06-15 DIAGNOSIS — M47812 Spondylosis without myelopathy or radiculopathy, cervical region: Secondary | ICD-10-CM | POA: Diagnosis not present

## 2021-06-15 DIAGNOSIS — M5030 Other cervical disc degeneration, unspecified cervical region: Secondary | ICD-10-CM | POA: Diagnosis present

## 2021-06-15 DIAGNOSIS — G894 Chronic pain syndrome: Secondary | ICD-10-CM | POA: Insufficient documentation

## 2021-06-15 MED ORDER — OXYCODONE HCL 10 MG PO TABS: 10.0000 mg | ORAL_TABLET | Freq: Three times a day (TID) | ORAL | 0 refills | Status: AC | PRN

## 2021-06-15 MED ORDER — OXYCODONE HCL 10 MG PO TABS: 10.0000 mg | ORAL_TABLET | Freq: Three times a day (TID) | ORAL | 0 refills | Status: DC | PRN

## 2021-06-15 MED ORDER — GABAPENTIN 300 MG PO CAPS: 300.0000 mg | ORAL_CAPSULE | Freq: Three times a day (TID) | ORAL | 5 refills | Status: DC

## 2021-06-15 NOTE — Progress Notes (Signed)
Safety precautions to be maintained throughout the outpatient stay will include: orient to surroundings, keep bed in low position, maintain call bell within reach at all times, provide assistance with transfer out of bed and ambulation.   Nursing Pain Medication Assessment:  Safety precautions to be maintained throughout the outpatient stay will include: orient to surroundings, keep bed in low position, maintain call bell within reach at all times, provide assistance with transfer out of bed and ambulation.  Medication Inspection Compliance: Pill count conducted under aseptic conditions, in front of the patient. Neither the pills nor the bottle was removed from the patient's sight at any time. Once count was completed pills were immediately returned to the patient in their original bottle.  Medication: Oxycodone IR Pill/Patch Count:  65 of 120 pills remain Pill/Patch Appearance: Markings consistent with prescribed medication Bottle Appearance: Standard pharmacy container. Clearly labeled. Filled Date: 40 / 27/ 2022 Last Medication intake:  Today

## 2021-06-15 NOTE — Progress Notes (Signed)
PROVIDER NOTE: Information contained herein reflects review and annotations entered in association with encounter. Interpretation of such information and data should be left to medically-trained personnel. Information provided to patient can be located elsewhere in the medical record under "Patient Instructions". Document created using STT-dictation technology, any transcriptional errors that may result from process are unintentional.    Patient: Kimberly [name]  Service Category: E/M  Provider: Gillis Santa, MD  DOB: [birth year]  DOS: 06/15/2021  Specialty: Interventional Pain Management  MRN: [id number]  Setting: Ambulatory outpatient  PCP: Dion Body, MD  Type: Established Patient    Referring Provider: Dion Body, MD  Location: Office  Delivery: Face-to-face     HPI  Kimberly [name], a 60 y.o. year old female, is here today because of her Cervical spondylosis without myelopathy [M47.812]. Kimberly [name] primary complain today is Neck Pain Last encounter: My last encounter with her was on 04/20/21 Pertinent problems: Kimberly [name] has SHOULDER PAIN; IMPINGEMENT SYNDROME; RUPTURE ROTATOR CUFF; CTS (carpal tunnel syndrome); Cervical spondylosis without myelopathy; Cervical stenosis of spinal canal; Pseudoarthrosis of cervical spine ([place]); Numbness and tingling; Vitamin D deficiency; Cervical fusion syndrome; Hx of cervical spine surgery; Chronic pain syndrome; Cervicalgia; Controlled substance agreement signed; Depression, major, single episode, complete remission ([place]); Cervical myofascial pain syndrome; and Occipital headache on their pertinent problem list. Pain Assessment: Severity of Chronic pain is reported as a 8 /10. Location: Neck Posterior, Lower/Radiates around to upper head area left or right just depending. Onset: More than a month ago. Quality: Constant, Sharp, Radiating, Penetrating, Jabbing. Timing: Constant. Modifying factor(s): "Heat, rest, and medication  helps". Vitals:  height is 5' 1" (1.549 m) and weight is 143 lb (64.9 kg). Her temporal temperature is 97.2 F (36.2 C) (abnormal). Her blood pressure is 120/92 (abnormal) and her pulse is 88. Her respiration is 16 and oxygen saturation is 99%.   Reason for encounter: medication management.    Kimberly [name] presents today for medication management.  Patient states that she was started on carbamazepine by her neurologist however this resulted in significant side effects of dizziness and cognitive dysfunction so she has discontinued.  At her last clinic visit, we transitioned her oxycodone 10 mg 3 times daily as needed to 5 mg every 6 hours as needed.  However the patient states that she would like to go back to her previous regimen.  She states that the 5 mg of oxycodone is not effective that she has been resorting to taking to 2 at once.  She is also requesting a refill of her gabapentin.  She has tried occipital nerve blocks in the past which did not provide benefit unfortunately.   Pharmacotherapy Assessment  Analgesic: Oxycodone 5 mg every 6 hours as needed, quantity 120/month; MME equals 30    Monitoring: [place] PMP: PDMP reviewed during this encounter.       Pharmacotherapy: No side-effects or adverse reactions reported. Compliance: No problems identified. Effectiveness: Clinically acceptable.  UDS:  Summary  Date Value Ref Range Status  02/08/2021 Note  Final    Comment:    ==================================================================== [employer] [facility] (MW) ==================================================================== Test                             Result       Flag       Units  Drug Present and Declared for Prescription Verification   Oxycodone  1168         EXPECTED   ng/mg creat   Oxymorphone                    2167         EXPECTED   ng/mg creat   Noroxycodone                   1622         EXPECTED   ng/mg creat   Noroxymorphone                 560           EXPECTED   ng/mg creat    Sources of oxycodone are scheduled prescription medications.    Oxymorphone, noroxycodone, and noroxymorphone are expected    metabolites of oxycodone. Oxymorphone is also available as a    scheduled prescription medication.  Drug Absent but Declared for Prescription Verification   Diazepam                       Not Detected UNEXPECTED ng/mg creat   Alprazolam                     Not Detected UNEXPECTED ng/mg creat ==================================================================== Test                      Result    Flag   Units      Ref Range   Creatinine              183              mg/dL      >=20 ==================================================================== Declared Medications:  The flagging and interpretation on this report are based on the  following declared medications.  Unexpected results may arise from  inaccuracies in the declared medications.   **Note: The testing scope of this panel includes these medications:   Alprazolam (Xanax)  Diazepam (Valium)  Oxycodone   **Note: The testing scope of this panel does not include the  following reported medications:   Acetaminophen (Tylenol)  Acyclovir  Amlodipine  Cholecalciferol  Duloxetine (Cymbalta)  Escitalopram (Lexapro)  Estradiol (Estrace)  Eye Drop  Gabapentin  Liothyronine (Cytomel)  Nortriptyline (Pamelor)  Nystatin  Pantoprazole (Protonix) ==================================================================== For clinical consultation, please call [phone]. ====================================================================       ROS  Constitutional: Denies any fever or chills +headaches, occipital dominant Gastrointestinal: No reported hemesis, hematochezia, vomiting, or acute GI distress Musculoskeletal: Denies any acute onset joint swelling, redness, loss of ROM, or weakness Neurological: No reported episodes of acute onset apraxia, aphasia,  dysarthria, agnosia, amnesia, paralysis, loss of coordination, or loss of consciousness  Medication Review  ALPRAZolam, Cholecalciferol, DULoxetine, Oxycodone HCl, Tetrahydrozoline HCl, acetaminophen, acyclovir, amLODipine, escitalopram, estradiol, gabapentin, ibuprofen, liothyronine, meloxicam, methocarbamol, nortriptyline, pantoprazole, and rizatriptan  History Review  Allergy: Kimberly [name] is allergic to shellfish allergy, peanut-containing drug products, codeine, diphtheria toxoid, and tetanus toxoids. Drug: Kimberly [name]  reports no history of drug use. Alcohol:  reports no history of alcohol use. Tobacco:  reports that she has never smoked. She has never used smokeless tobacco. Social: Kimberly [name]  reports that she has never smoked. She has never used smokeless tobacco. She reports that she does not drink alcohol and does not use drugs. Medical:  has a past medical history of Anxiety, Arthritis, Asthma, Balance problem, Brain cyst, Cervical fusion syndrome, Complete rupture of  rotator cuff, Depression, Difficult intubation, Dyspnea, Flu (08/10/2018), GERD (gastroesophageal reflux disease), Headache, Herpes, History of kidney stones, History of pneumonia, Hypertension, Inflammation of shoulder joint, Memory difficulties, Neuromuscular disorder ([place]), Occipital neuralgia, Pneumothorax on right, PONV (postoperative nausea and vomiting), Pseudoarthrosis of cervical spine ([place]), Recurrent falls, Syncope, and Urinary frequency. Surgical: Kimberly [name]  has a past surgical history that includes Abdominal hysterectomy; Rotator cuff repair (Left); Foreign Body Removal (Left); Carpal tunnel release (Left, 02/16/2013); Carpal tunnel release (Right, 03/27/2013); Cesarean section; Anterior cervical decomp/discectomy fusion (N/A, 08/06/2014); Colonoscopy; Nasal sinus surgery (N/A, 04/13/2015); Shoulder arthroscopy with distal clavicle resection (Left, 05/18/2015); Shoulder acromioplasty (Left, 05/18/2015); Cholecystectomy  (N/A, 06/17/2015); Neck surgery (2016); Anterior cervical decomp/discectomy fusion (N/A, 07/04/2016); Posterior cervical fusion/foraminotomy (N/A, 11/13/2016); Knee arthroscopy; Extracorporeal shock wave lithotripsy (Left, 09/04/2018); Colonoscopy with propofol (N/A, 05/06/2019); Reduction mammaplasty; and Esophagogastroduodenoscopy (N/A, 05/08/2021). Family: family history includes Hypertension in her father and another family member.  Laboratory Chemistry Profile   Renal Lab Results  Component Value Date   BUN 9 07/01/2019   CREATININE 0.86 07/01/2019   BCR 10 07/01/2019   GFRAA 86 07/01/2019   GFRNONAA 75 07/01/2019     Hepatic Lab Results  Component Value Date   AST 26 07/31/2015   ALT 17 07/31/2015   ALBUMIN 4.1 07/31/2015   ALKPHOS 93 07/31/2015   LIPASE 23 07/15/2017     Electrolytes Lab Results  Component Value Date   NA 144 07/01/2019   K 4.8 07/01/2019   CL 103 07/01/2019   CALCIUM 10.3 (H) 07/01/2019     Bone No results found for: VD25OH, VD125OH2TOT, CM0349ZP9, XT0569VX4, 25OHVITD1, 25OHVITD2, 25OHVITD3, TESTOFREE, TESTOSTERONE   Inflammation (CRP: Acute Phase) (ESR: Chronic Phase) No results found for: CRP, ESRSEDRATE, LATICACIDVEN     Note: Above Lab results reviewed.  Recent Imaging Review  CT RENAL STONE STUDY CLINICAL DATA:  Left flank pain and left lower quadrant pain for 3 months. Nephrolithiasis.  EXAM: CT ABDOMEN AND PELVIS WITHOUT CONTRAST  TECHNIQUE: Multidetector CT imaging of the abdomen and pelvis was performed following the standard protocol without IV contrast.  COMPARISON:  08/04/2018  FINDINGS: Lower chest: No acute findings.  Hepatobiliary: No mass visualized on this unenhanced exam. Prior cholecystectomy. No evidence of biliary obstruction.  Pancreas: No mass or inflammatory process visualized on this unenhanced exam.  Spleen:  Within normal limits in size.  Adrenals/Urinary tract: No evidence of urolithiasis  or hydronephrosis. Unremarkable unopacified urinary bladder.  Stomach/Bowel: No evidence of obstruction, inflammatory process, or abnormal fluid collections.  Vascular/Lymphatic: No pathologically enlarged lymph nodes identified. No evidence of abdominal aortic aneurysm.  Reproductive: Prior hysterectomy. Cystic lesion in the left adnexa measures 4.7 x 2.9 cm, without significant change since prior study, consistent with benign etiology. No evidence of inflammatory process or free fluid.  Other:  None.  Musculoskeletal:  No suspicious bone lesions identified.  IMPRESSION: No evidence of urolithiasis, hydronephrosis, or other acute findings.  Stable cystic lesion in left adnexa, consistent with benign etiology.  Electronically Signed   By: Marlaine Hind M.D.   On: 03/09/2021 14:19 Note: Reviewed        Physical Exam  General appearance: Well nourished, well developed, and well hydrated. In no apparent acute distress Mental status: Alert, oriented x 3 (person, place, & time)       Respiratory: No evidence of acute respiratory distress Eyes: PERLA Vitals: BP (!) 120/92 (BP Location: Right Arm, Patient Position: Sitting, Cuff Size: Normal)   Pulse 88   Temp Marland Kitchen)  97.2 F (36.2 C) (Temporal)   Resp 16   Ht 5' 1" (1.549 m)   Wt 143 lb (64.9 kg)   SpO2 99%   BMI 27.02 kg/m  BMI: Estimated body mass index is 27.02 kg/m as calculated from the following:   Height as of this encounter: 5' 1" (1.549 m).   Weight as of this encounter: 143 lb (64.9 kg). Ideal: Ideal body weight: 47.8 kg (105 lb 6.1 oz) Adjusted ideal body weight: 54.6 kg (120 lb 6.8 oz)  Cervical Spine Exam  Skin & Axial Inspection: Well healed scar from previous spine surgery detected Alignment: Symmetrical Functional ROM: Pain restricted ROM      Stability: No instability detected Muscle Tone/Strength: Functionally intact. No obvious neuro-muscular anomalies detected. Sensory (Neurological): Neurogenic pain  pattern Palpation: No palpable anomalies                          Upper Extremity (UE) Exam      Side: Right upper extremity   Side: Left upper extremity    Skin & Extremity Inspection: Skin color, temperature, and hair growth are WNL. No peripheral edema or cyanosis. No masses, redness, swelling, asymmetry, or associated skin lesions. No contractures.   Skin & Extremity Inspection: Skin color, temperature, and hair growth are WNL. No peripheral edema or cyanosis. No masses, redness, swelling, asymmetry, or associated skin lesions. No contractures.    Functional ROM: Unrestricted ROM           Functional ROM: Unrestricted ROM            Muscle Tone/Strength: Functionally intact. No obvious neuro-muscular anomalies detected.   Muscle Tone/Strength: Functionally intact. No obvious neuro-muscular anomalies detected.    Sensory (Neurological): Unimpaired           Sensory (Neurological): Unimpaired            Palpation: No palpable anomalies               Palpation: No palpable anomalies                Provocative Test(s):  Phalen's test: deferred Tinel's test: deferred Apley's scratch test (touch opposite shoulder):  Action 1 (Across chest): deferred Action 2 (Overhead): deferred Action 3 (LB reach): deferred     Provocative Test(s):  Phalen's test: deferred Tinel's test: deferred Apley's scratch test (touch opposite shoulder):  Action 1 (Across chest): deferred Action 2 (Overhead): deferred Action 3 (LB reach): deferred       Assessment   Status Diagnosis  Controlled Controlled Controlled 1. Cervical spondylosis without myelopathy   2. Hx of cervical spine surgery   3. Cervical myofascial pain syndrome   4. Cervical fusion syndrome (C2-T1)   5. Bilateral occipital neuralgia   6. Degenerative disc disease, cervical   7. Cervical facet joint syndrome   8. Chronic pain syndrome         Plan of Care   Kimberly [name] has a current medication list which includes the  following long-term medication(s): escitalopram, liothyronine, nortriptyline, and gabapentin.  Pharmacotherapy (Medications Ordered): Meds ordered this encounter  Medications   Oxycodone HCl 10 MG TABS    Sig: Take 1 tablet (10 mg total) by mouth every 8 (eight) hours as needed. Must last 30 days.    Dispense:  90 tablet    Refill:  0    Chronic Pain: STOP Act (Not applicable) Fill 1 day early if closed on refill date.  Avoid benzodiazepines within 8 hours of opioids   Oxycodone HCl 10 MG TABS    Sig: Take 1 tablet (10 mg total) by mouth every 8 (eight) hours as needed. Must last 30 days.    Dispense:  90 tablet    Refill:  0    Chronic Pain: STOP Act (Not applicable) Fill 1 day early if closed on refill date. Avoid benzodiazepines within 8 hours of opioids   Oxycodone HCl 10 MG TABS    Sig: Take 1 tablet (10 mg total) by mouth every 8 (eight) hours as needed. Must last 30 days.    Dispense:  90 tablet    Refill:  0    Chronic Pain: STOP Act (Not applicable) Fill 1 day early if closed on refill date. Avoid benzodiazepines within 8 hours of opioids   gabapentin (NEURONTIN) 300 MG capsule    Sig: Take 1 capsule (300 mg total) by mouth 3 (three) times daily.    Dispense:  90 capsule    Refill:  5     Follow-up plan:   Return in about 3 months (around 09/19/2021) for Medication Management, in person.     cervical TPI, cervical facet medial branch nerve block, suprascapular nerve block, bilateral C4, C5, C6   cervical facet medial branch nerve block 07/04/2020: Not effective, do not repeat.        Recent Visits Date Type Provider Dept  04/20/21 Office Visit Gillis Santa, MD [facility]  Showing recent visits within past 90 days and meeting all other requirements Today's Visits Date Type Provider Dept  06/15/21 Office Visit Gillis Santa, MD [facility]  Showing today's visits and meeting all other requirements Future Appointments No visits were found meeting these  conditions. Showing future appointments within next 90 days and meeting all other requirements I discussed the assessment and treatment plan with the patient. The patient was provided an opportunity to ask questions and all were answered. The patient agreed with the plan and demonstrated an understanding of the instructions.  Patient advised to call back or seek an in-person evaluation if the symptoms or condition worsens.  Duration of encounter: 30 minutes.  Note by: Gillis Santa, MD Date: 06/15/2021; Time: 1:22 PM

## 2021-07-12 ENCOUNTER — Ambulatory Visit
Admission: RE | Admit: 2021-07-12 | Discharge: 2021-07-12 | Disposition: A | Discharge status: Home / Self Care | Payer: Medicare Other | Attending: Urology | Admitting: Urology

## 2021-07-12 ENCOUNTER — Other Ambulatory Visit: Payer: Self-pay

## 2021-07-12 ENCOUNTER — Ambulatory Visit: Payer: Medicare Other | Admitting: Urology

## 2021-07-12 ENCOUNTER — Encounter: Payer: Self-pay | Admitting: Urology

## 2021-07-12 ENCOUNTER — Ambulatory Visit
Admission: RE | Admit: 2021-07-12 | Discharge: 2021-07-12 | Disposition: A | Discharge status: Home / Self Care | Payer: Medicare Other | Source: Ambulatory Visit | Attending: Urology | Admitting: Urology

## 2021-07-12 VITALS — BP 118/83 | HR 101 | Ht 61.0 in | Wt 145.0 lb

## 2021-07-12 DIAGNOSIS — R1030 Lower abdominal pain, unspecified: Secondary | ICD-10-CM

## 2021-07-12 DIAGNOSIS — N200 Calculus of kidney: Secondary | ICD-10-CM | POA: Diagnosis present

## 2021-07-12 DIAGNOSIS — R3915 Urgency of urination: Secondary | ICD-10-CM | POA: Diagnosis not present

## 2021-07-12 DIAGNOSIS — R3129 Other microscopic hematuria: Secondary | ICD-10-CM | POA: Diagnosis not present

## 2021-07-12 DIAGNOSIS — R350 Frequency of micturition: Secondary | ICD-10-CM | POA: Diagnosis not present

## 2021-07-12 LAB — URINALYSIS, COMPLETE
Bilirubin, UA: NEGATIVE
Glucose, UA: NEGATIVE
Leukocytes,UA: NEGATIVE
Nitrite, UA: NEGATIVE
Specific Gravity, UA: 1.025 (ref 1.005–1.030)
Urobilinogen, Ur: 1 mg/dL (ref 0.2–1.0)
pH, UA: 6 (ref 5.0–7.5)

## 2021-07-12 LAB — MICROSCOPIC EXAMINATION: Epithelial Cells (non renal): >10 /hpf — ABNORMAL HIGH (ref 0–10)

## 2021-07-12 MED ORDER — GEMTESA 75 MG PO TABS: 75.0000 mg | ORAL_TABLET | Freq: Every day | ORAL | 0 refills | Status: DC

## 2021-07-12 MED ORDER — TAMSULOSIN HCL 0.4 MG PO CAPS: 0.4000 mg | ORAL_CAPSULE | Freq: Every day | ORAL | 0 refills | Status: DC

## 2021-07-12 NOTE — Progress Notes (Signed)
07/12/2021 2:55 PM   Kimberly [name] 1961-02-24 [id number]  Referring provider: Dion Body, MD [facility] [place],  [place] [address]  Chief Complaint  Patient presents with   Nephrolithiasis    Urologic history: 1.  Nephrolithiasis 7 mm left lower pole calculus treated with SWL 09/04/2018 CT did show a 4 mm left lower pole calculus   HPI: 60 y.o. female presents for an acute visit for lower urinary tract symptoms.  Saw Dr. Netty Starring 06/20/2021 with a 1 week history of lower abdominal cramping and brownish colored urine Urinalysis showed moderate blood on dipstick, 4-10 RBCs.  There were many squamous epithelial cells and moderate bacteria.  Urine culture grew mixed flora She states her lower abdominal pain has improved however she complains of frequency, urgency with occasional episodes of urge incontinence.  She states last night she got up 7 times to void I saw her July 2022 with complaints of left abdominal pain.  A stone protocol CT was ordered which showed a left ovarian cyst measuring 4.7 cm that was stable.  On my review I thought there was a 2 mm left distal ureteral calculus without obstruction She states her pain subsequently resolved however she was not aware of passing a stone   PMH: Past Medical History:  Diagnosis Date   Anxiety    takes alprazolam - rare use    Arthritis    cervical spondylosis    Asthma    Balance problem    Brain cyst    Cervical fusion syndrome    Complete rupture of rotator cuff    Depression    Difficult intubation    Small mouth opening, limited neck flexion, very anterior    Dyspnea    Flu 08/10/2018   tested positive   GERD (gastroesophageal reflux disease)    pt. reports that its better, no meds in use at this time- 2015   Headache    Herpes    History of kidney stones    History of pneumonia    Hypertension    pt. doesn't see cardiologist, followed for HTN by Dr. Gerarda Fraction    Inflammation of shoulder joint    Memory difficulties    Neuromuscular disorder ([place])    joint and muscle problems   Occipital neuralgia    Pneumothorax on right    following C4-6 ACDF 07/04/16   PONV (postoperative nausea and vomiting)    WITH SURGERY 07/04/16 (NECK) RT LUNG COLLAPSED   Pseudoarthrosis of cervical spine (HCC)    Recurrent falls    Syncope    Urinary frequency     Surgical History: Past Surgical History:  Procedure Laterality Date   ABDOMINAL HYSTERECTOMY     ANTERIOR CERVICAL DECOMP/DISCECTOMY FUSION N/A 08/06/2014   Procedure: ANTERIOR CERVICAL DECOMPRESSION/DISCECTOMY FUSION CERVICAL THREE-FOUR,CERVICAL SIX-SEVEN ,CERVICAL SEVEN-THORACIC ONE;  Surgeon: Floyce Stakes, MD;  Location: [place];  Service: Neurosurgery;  Laterality: N/A;   ANTERIOR CERVICAL DECOMP/DISCECTOMY FUSION N/A 07/04/2016   Procedure: CERVICAL FOUR-FIVE, CERVICAL FIVE-SIX ANTERIOR CERVICAL DECOMPRESSION/DISCECTOMY/FUSION WITH REVISION OF CERVICAL THREE-FOUR PLATE;  Surgeon: Leeroy Cha, MD;  Location: [place];  Service: Neurosurgery;  Laterality: N/A;   CARPAL TUNNEL RELEASE Left 02/16/2013   Procedure: CARPAL TUNNEL RELEASE;  Surgeon: Carole Civil, MD;  [facility];  Service: Orthopedics;  Laterality: Left;   CARPAL TUNNEL RELEASE Right 03/27/2013   Procedure: RIGHT CARPAL TUNNEL RELEASE;  Surgeon: Carole Civil, MD;  [facility];  Service: Orthopedics;  Laterality:  Right;   CESAREAN SECTION     x2   CHOLECYSTECTOMY N/A 06/17/2015   Procedure: LAPAROSCOPIC CHOLECYSTECTOMY;  Surgeon: Aviva Signs, MD;  Location: [facility];  Service: General;  Laterality: N/A;   COLONOSCOPY     COLONOSCOPY WITH PROPOFOL N/A 05/06/2019   Procedure: COLONOSCOPY WITH PROPOFOL;  Surgeon: Robert Bellow, MD;  Location: [facility];  Service: Endoscopy;  Laterality: N/A;   ESOPHAGOGASTRODUODENOSCOPY N/A 05/08/2021   Procedure: ESOPHAGOGASTRODUODENOSCOPY (EGD);  Surgeon: Lesly Rubenstein, MD;   Location: [facility];  Service: Endoscopy;  Laterality: N/A;   EXTRACORPOREAL SHOCK WAVE LITHOTRIPSY Left 09/04/2018   Procedure: EXTRACORPOREAL SHOCK WAVE LITHOTRIPSY (ESWL);  Surgeon: Billey Co, MD;  Location: [facility];  Service: Urology;  Laterality: Left;   FOREIGN BODY REMOVAL Left    knee-as child   KNEE ARTHROSCOPY     NASAL SINUS SURGERY N/A 04/13/2015   Procedure: nasal endoscopy with adenoid biopsy;  Surgeon: Ruby Cola, MD;  Location: [place];  Service: ENT;  Laterality: N/A;   NECK SURGERY  2016   x 3 all together   POSTERIOR CERVICAL FUSION/FORAMINOTOMY N/A 11/13/2016   Procedure: CERVICAL TWO-CERVICAL SIX POSTERIOR CERVICAL FUSION WITH LATERAL MASS FIXATION;  Surgeon: Kristeen Miss, MD;  Location: [place];  Service: Neurosurgery;  Laterality: N/A;  posterior approach   REDUCTION MAMMAPLASTY     ROTATOR CUFF REPAIR Left    SHOULDER ACROMIOPLASTY Left 05/18/2015   Procedure: SHOULDER ACROMIOPLASTY;  Surgeon: Earlie Server, MD;  Location: [place];  Service: Orthopedics;  Laterality: Left;   SHOULDER ARTHROSCOPY WITH DISTAL CLAVICLE RESECTION Left 05/18/2015   Procedure: LEFT SHOULDER ARTHROSCOPY WITH  DISTAL CLAVICLE RESECTION;  Surgeon: Earlie Server, MD;  Location: [place];  Service: Orthopedics;  Laterality: Left;    Home Medications:  Allergies as of 07/12/2021       Reactions   Shellfish Allergy Anaphylaxis, Hives   "Swells throat up"   Peanut-containing Drug Products Hives   Codeine Other (See Comments)   jitters   Diphtheria Toxoid Rash   Tetanus Toxoids Rash        Medication List        Accurate as of July 12, 2021  2:55 PM. If you have any questions, ask your nurse or doctor.          acetaminophen 500 MG tablet Commonly known as: TYLENOL Take 500 mg by mouth 2 (two) times daily as needed for headache.   acyclovir 400 MG tablet Commonly known as: ZOVIRAX Take 400 mg by mouth 2 (two) times daily.    ALPRAZolam 0.5 MG tablet Commonly known as: XANAX Take 0.5 mg by mouth 3 (three) times daily as needed for anxiety.   amLODipine 5 MG tablet Commonly known as: NORVASC Take 5 mg by mouth daily.   Cholecalciferol 25 MCG (1000 UT) tablet Take 1,000 Units by mouth daily.   DULoxetine 30 MG capsule Commonly known as: CYMBALTA Take by mouth.   escitalopram 10 MG tablet Commonly known as: LEXAPRO Take 10 mg by mouth daily as needed (anxiety).   estradiol 0.5 MG tablet Commonly known as: ESTRACE Take 0.5 mg by mouth daily.   gabapentin 300 MG capsule Commonly known as: NEURONTIN Take 1 capsule (300 mg total) by mouth 3 (three) times daily.   ibuprofen 200 MG tablet Commonly known as: ADVIL Take 200 mg by mouth every 6 (six) hours as needed.   liothyronine 5 MCG tablet Commonly known as: CYTOMEL Take 1 tablet by  mouth daily.   meloxicam 7.5 MG tablet Commonly known as: MOBIC Take 1 tablet by mouth 2 (two) times daily.   methocarbamol 500 MG tablet Commonly known as: ROBAXIN Take 500 mg by mouth every 6 (six) hours as needed.   nortriptyline 10 MG capsule Commonly known as: PAMELOR Take 1 capsule by mouth at bedtime as needed for sleep.   Oxycodone HCl 10 MG Tabs Take 1 tablet (10 mg total) by mouth every 8 (eight) hours as needed. Must last 30 days.   Oxycodone HCl 10 MG Tabs Take 1 tablet (10 mg total) by mouth every 8 (eight) hours as needed. Must last 30 days. Start taking on: July 24, 2021   Oxycodone HCl 10 MG Tabs Take 1 tablet (10 mg total) by mouth every 8 (eight) hours as needed. Must last 30 days. Start taking on: August 23, 2021   pantoprazole 40 MG tablet Commonly known as: PROTONIX Take 40 mg by mouth daily.   rizatriptan 10 MG tablet Commonly known as: MAXALT Take 10 mg by mouth as needed for migraine. May repeat in 2 hours if needed   VISINE OP Apply 1 drop to eye daily as needed (irritation).        Allergies:  Allergies   Allergen Reactions   Shellfish Allergy Anaphylaxis and Hives    "Swells throat up"   Peanut-Containing Drug Products Hives   Codeine Other (See Comments)    jitters   Diphtheria Toxoid Rash   Tetanus Toxoids Rash    Family History: Family History  Problem Relation Age of Onset   Hypertension Father    Hypertension Other    Breast cancer Neg Hx     Social History:  reports that she has never smoked. She has never used smokeless tobacco. She reports that she does not drink alcohol and does not use drugs.   Physical Exam: BP 118/83    Pulse (!) 101    Ht 5\' 1"  (1.549 m)    Wt 145 lb (65.8 kg)    BMI 27.40 kg/m   Constitutional:  Alert and oriented, No acute distress. HEENT: [place] AT, moist mucus membranes.  Trachea midline, no masses. Cardiovascular: No clubbing, cyanosis, or edema. Respiratory: Normal respiratory effort, no increased work of breathing. Psychiatric: Normal mood and affect.  Laboratory Data:  Urinalysis Dipstick 1+ ketones/2+ blood/trace protein Microscopy no WBC/RBC; >10 epis   Assessment & Plan:   60 y.o. female with history of lower abdominal pain and frequency, urgency UA today unremarkable Prior UA [facility] with 4-10 RBCs however there was squamous epithelial contamination KUB today and will call with results.  May need repeat CT for persistent symptoms Gemtesa samples given Rx tamsulosin 0.4 mg daily for possible distal ureteral calculus   Abbie Sons, MD  [facility] [place] [address], [place], [place] [address] [phone]

## 2021-07-17 ENCOUNTER — Telehealth: Payer: Self-pay | Admitting: *Deleted

## 2021-07-17 DIAGNOSIS — R3129 Other microscopic hematuria: Secondary | ICD-10-CM

## 2021-07-17 NOTE — Telephone Encounter (Signed)
Notified patient as instructed, patient is feeling better but she would like to get the ct urogram done

## 2021-07-17 NOTE — Telephone Encounter (Signed)
-----   Message from Abbie Sons, MD sent at 07/15/2021  9:17 AM EST ----- KUB showed no obvious stone.  Please check and see how symptoms are doing.  If having persistent symptoms would recommend a CT urogram

## 2021-07-17 NOTE — Addendum Note (Signed)
Addended by: Abbie Sons on: 07/17/2021 09:28 AM   Modules accepted: Orders

## 2021-07-17 NOTE — Telephone Encounter (Signed)
CT order entered

## 2021-07-20 ENCOUNTER — Encounter: Payer: Self-pay | Admitting: Physician Assistant

## 2021-07-20 ENCOUNTER — Ambulatory Visit: Payer: Medicare Other | Admitting: Physician Assistant

## 2021-07-20 ENCOUNTER — Other Ambulatory Visit: Payer: Self-pay

## 2021-07-20 VITALS — BP 111/78 | HR 92 | Ht 61.0 in | Wt 146.0 lb

## 2021-07-20 DIAGNOSIS — R102 Pelvic and perineal pain: Secondary | ICD-10-CM | POA: Diagnosis not present

## 2021-07-20 NOTE — Patient Instructions (Signed)
Go pick up the Flomax (tamsulosin) prescription at your pharmacy and start taking that once daily. Keep plans for your upcoming CT scan. I'll send your urine for a specialized culture today and call you with your results when I receive them, most likely next week. We'll bring you back in to clinic after you get your CT scan for a cystoscopy with Dr. Bernardo Heater and to discuss your results. I've put more information about cystoscopy at the back of this packet.

## 2021-07-20 NOTE — Progress Notes (Signed)
In and Out Catheterization  Patient is present today for a I & O catheterization due to urinary frequency. Patient was cleaned and prepped in a sterile fashion with betadine . A 14FR cath was inserted no complications were noted , 56ml of urine return was noted, urine was dark yellow in color. A clean urine sample was collected for urinalysis. Bladder was drained  And catheter was removed with out difficulty.    Performed by: Bradly Bienenstock CMA & Kerman Passey RMA

## 2021-07-20 NOTE — Progress Notes (Signed)
07/20/2021 3:10 PM   TALAYLA DOYEL [birth year] [id number]  CC: Chief Complaint  Patient presents with   Groin Pain   Urinary Frequency   HPI: Kimberly [name] is a 60 y.o. female with PMH nephrolithiasis who presents today for evaluation of vaginal pain.   Today she reports a 2-week history of sharp pain in the vagina when bearing down to urinate or have a bowel movement.  This is also occurring when she ambulates.  It does not occur at rest.  Dr. Bernardo Heater prescribed her Flomax 8 days ago for management of a possible 2 mm distal left ureteral stone.  She reports she has not yet picked this up from the pharmacy.  She is scheduled for a CT urogram on 08/11/2021.  In-office catheterized UA today positive for 1+ bilirubin, 1+ ketones, 2+ blood, and 1+ protein; urine microscopy with 3-10 RBCs/HPF.  PMH: Past Medical History:  Diagnosis Date   Anxiety    takes alprazolam - rare use    Arthritis    cervical spondylosis    Asthma    Balance problem    Brain cyst    Cervical fusion syndrome    Complete rupture of rotator cuff    Depression    Difficult intubation    Small mouth opening, limited neck flexion, very anterior    Dyspnea    Flu 08/10/2018   tested positive   GERD (gastroesophageal reflux disease)    pt. reports that its better, no meds in use at this time- 2015   Headache    Herpes    History of kidney stones    History of pneumonia    Hypertension    pt. doesn't see cardiologist, followed for HTN by Dr. Gerarda Fraction   Inflammation of shoulder joint    Memory difficulties    Neuromuscular disorder ([place])    joint and muscle problems   Occipital neuralgia    Pneumothorax on right    following C4-6 ACDF 07/04/16   PONV (postoperative nausea and vomiting)    WITH SURGERY 07/04/16 (NECK) RT LUNG COLLAPSED   Pseudoarthrosis of cervical spine (HCC)    Recurrent falls    Syncope    Urinary frequency     Surgical History: Past Surgical History:  Procedure  Laterality Date   ABDOMINAL HYSTERECTOMY     ANTERIOR CERVICAL DECOMP/DISCECTOMY FUSION N/A 08/06/2014   Procedure: ANTERIOR CERVICAL DECOMPRESSION/DISCECTOMY FUSION CERVICAL THREE-FOUR,CERVICAL SIX-SEVEN ,CERVICAL SEVEN-THORACIC ONE;  Surgeon: Floyce Stakes, MD;  Location: [place];  Service: Neurosurgery;  Laterality: N/A;   ANTERIOR CERVICAL DECOMP/DISCECTOMY FUSION N/A 07/04/2016   Procedure: CERVICAL FOUR-FIVE, CERVICAL FIVE-SIX ANTERIOR CERVICAL DECOMPRESSION/DISCECTOMY/FUSION WITH REVISION OF CERVICAL THREE-FOUR PLATE;  Surgeon: Leeroy Cha, MD;  Location: [place];  Service: Neurosurgery;  Laterality: N/A;   CARPAL TUNNEL RELEASE Left 02/16/2013   Procedure: CARPAL TUNNEL RELEASE;  Surgeon: Carole Civil, MD;  [facility];  Service: Orthopedics;  Laterality: Left;   CARPAL TUNNEL RELEASE Right 03/27/2013   Procedure: RIGHT CARPAL TUNNEL RELEASE;  Surgeon: Carole Civil, MD;  [facility];  Service: Orthopedics;  Laterality: Right;   CESAREAN SECTION     x2   CHOLECYSTECTOMY N/A 06/17/2015   Procedure: LAPAROSCOPIC CHOLECYSTECTOMY;  Surgeon: Aviva Signs, MD;  Location: [facility];  Service: General;  Laterality: N/A;   COLONOSCOPY     COLONOSCOPY WITH PROPOFOL N/A 05/06/2019   Procedure: COLONOSCOPY WITH PROPOFOL;  Surgeon: Robert Bellow, MD;  Location: [facility];  Service: Endoscopy;  Laterality: N/A;   ESOPHAGOGASTRODUODENOSCOPY N/A 05/08/2021   Procedure: ESOPHAGOGASTRODUODENOSCOPY (EGD);  Surgeon: Lesly Rubenstein, MD;  [facility];  Service: Endoscopy;  Laterality: N/A;   EXTRACORPOREAL SHOCK WAVE LITHOTRIPSY Left 09/04/2018   Procedure: EXTRACORPOREAL SHOCK WAVE LITHOTRIPSY (ESWL);  Surgeon: Billey Co, MD;  Location: [facility];  Service: Urology;  Laterality: Left;   FOREIGN BODY REMOVAL Left    knee-as child   KNEE ARTHROSCOPY     NASAL SINUS SURGERY N/A 04/13/2015   Procedure: nasal endoscopy with adenoid biopsy;  Surgeon: Ruby Cola,  MD;  Location: [place];  Service: ENT;  Laterality: N/A;   NECK SURGERY  2016   x 3 all together   POSTERIOR CERVICAL FUSION/FORAMINOTOMY N/A 11/13/2016   Procedure: CERVICAL TWO-CERVICAL SIX POSTERIOR CERVICAL FUSION WITH LATERAL MASS FIXATION;  Surgeon: Kristeen Miss, MD;  Location: [place];  Service: Neurosurgery;  Laterality: N/A;  posterior approach   REDUCTION MAMMAPLASTY     ROTATOR CUFF REPAIR Left    SHOULDER ACROMIOPLASTY Left 05/18/2015   Procedure: SHOULDER ACROMIOPLASTY;  Surgeon: Earlie Server, MD;  Location: [place];  Service: Orthopedics;  Laterality: Left;   SHOULDER ARTHROSCOPY WITH DISTAL CLAVICLE RESECTION Left 05/18/2015   Procedure: LEFT SHOULDER ARTHROSCOPY WITH  DISTAL CLAVICLE RESECTION;  Surgeon: Earlie Server, MD;  Location: [place];  Service: Orthopedics;  Laterality: Left;    Home Medications:  Allergies as of 07/20/2021       Reactions   Shellfish Allergy Anaphylaxis, Hives   "Swells throat up"   Peanut-containing Drug Products Hives   Codeine Other (See Comments)   jitters   Diphtheria Toxoid Rash   Tetanus Toxoids Rash        Medication List        Accurate as of July 20, 2021  3:10 PM. If you have any questions, ask your nurse or doctor.          acetaminophen 500 MG tablet Commonly known as: TYLENOL Take 500 mg by mouth 2 (two) times daily as needed for headache.   acyclovir 400 MG tablet Commonly known as: ZOVIRAX Take 400 mg by mouth 2 (two) times daily.   ALPRAZolam 0.5 MG tablet Commonly known as: XANAX Take 0.5 mg by mouth 3 (three) times daily as needed for anxiety.   amLODipine 5 MG tablet Commonly known as: NORVASC Take 5 mg by mouth daily.   Cholecalciferol 25 MCG (1000 UT) tablet Take 1,000 Units by mouth daily.   DULoxetine 30 MG capsule Commonly known as: CYMBALTA Take by mouth.   escitalopram 10 MG tablet Commonly known as: LEXAPRO Take 10 mg by mouth daily as needed  (anxiety).   estradiol 0.5 MG tablet Commonly known as: ESTRACE Take 0.5 mg by mouth daily.   gabapentin 300 MG capsule Commonly known as: NEURONTIN Take 1 capsule (300 mg total) by mouth 3 (three) times daily.   Gemtesa 75 MG Tabs Generic drug: Vibegron Take 75 mg by mouth daily.   ibuprofen 200 MG tablet Commonly known as: ADVIL Take 200 mg by mouth every 6 (six) hours as needed.   liothyronine 5 MCG tablet Commonly known as: CYTOMEL Take 1 tablet by mouth daily.   meloxicam 7.5 MG tablet Commonly known as: MOBIC Take 1 tablet by mouth 2 (two) times daily.   methocarbamol 500 MG tablet Commonly known as: ROBAXIN Take 500 mg by mouth every 6 (six) hours as needed.   nortriptyline 10 MG capsule Commonly known as: PAMELOR  Take 1 capsule by mouth at bedtime as needed for sleep.   Oxycodone HCl 10 MG Tabs Take 1 tablet (10 mg total) by mouth every 8 (eight) hours as needed. Must last 30 days.   Oxycodone HCl 10 MG Tabs Take 1 tablet (10 mg total) by mouth every 8 (eight) hours as needed. Must last 30 days. Start taking on: July 24, 2021   Oxycodone HCl 10 MG Tabs Take 1 tablet (10 mg total) by mouth every 8 (eight) hours as needed. Must last 30 days. Start taking on: August 23, 2021   pantoprazole 40 MG tablet Commonly known as: PROTONIX Take 40 mg by mouth daily.   rizatriptan 10 MG tablet Commonly known as: MAXALT Take 10 mg by mouth as needed for migraine. May repeat in 2 hours if needed   tamsulosin 0.4 MG Caps capsule Commonly known as: FLOMAX Take 1 capsule (0.4 mg total) by mouth daily.   valACYclovir 1000 MG tablet Commonly known as: VALTREX Take 1,000 mg by mouth daily.   VISINE OP Apply 1 drop to eye daily as needed (irritation).        Allergies:  Allergies  Allergen Reactions   Shellfish Allergy Anaphylaxis and Hives    "Swells throat up"   Peanut-Containing Drug Products Hives   Codeine Other (See Comments)    jitters    Diphtheria Toxoid Rash   Tetanus Toxoids Rash    Family History: Family History  Problem Relation Age of Onset   Hypertension Father    Hypertension Other    Breast cancer Neg Hx     Social History:   reports that she has never smoked. She has never used smokeless tobacco. She reports that she does not drink alcohol and does not use drugs.  Physical Exam: BP 111/78    Pulse 92    Ht 5\' 1"  (1.549 m)    Wt 146 lb (66.2 kg)    BMI 27.59 kg/m   Constitutional:  Alert and oriented, no acute distress, nontoxic appearing HEENT: , AT Cardiovascular: No clubbing, cyanosis, or edema Respiratory: Normal respiratory effort, no increased work of breathing Skin: No rashes, bruises or suspicious lesions Neurologic: Grossly intact, no focal deficits, moving all 4 extremities Psychiatric: Normal mood and affect  Laboratory Data: Results for orders placed or performed in visit on 07/20/21  CULTURE, URINE COMPREHENSIVE   Specimen: Urine   UR  Result Value Ref Range   Urine Culture, Comprehensive Final report (A)    Organism ID, Bacteria Comment (A)    ANTIMICROBIAL SUSCEPTIBILITY Comment   Mycoplasma / ureaplasma culture   Specimen: Genital   UR  Result Value Ref Range   Ureaplasma urealyticum Negative Negative   Mycoplasma hominis Culture Negative Negative  Microscopic Examination   Urine  Result Value Ref Range   WBC, UA 0-5 0 - 5 /hpf   RBC 3-10 (A) 0 - 2 /hpf   Epithelial Cells (non renal) 0-10 0 - 10 /hpf   Casts Present (A) None seen /lpf   Cast Type Hyaline casts N/A   Bacteria, UA Few None seen/Few  Urinalysis, Complete  Result Value Ref Range   Specific Gravity, UA 1.020 1.005 - 1.030   pH, UA 5.5 5.0 - 7.5   Color, UA Orange Yellow   Appearance Ur Cloudy (A) Clear   Leukocytes,UA Negative Negative   Protein,UA 1+ (A) Negative/Trace   Glucose, UA Negative Negative   Ketones, UA 1+ (A) Negative   RBC, UA 2+ (A)  Negative   Bilirubin, UA Negative Negative    Urobilinogen, Ur 1.0 0.2 - 1.0 mg/dL   Nitrite, UA Negative Negative   Microscopic Examination See below:    Assessment & Plan:   1. Vaginal pain UA today is notable only for microscopic hematuria.  We discussed that her vaginal discomfort could be associated with a distal ureteral stone and I encouraged her to start Flomax as prescribed.  We will also send for standard and atypical urine cultures today to rule out underlying infection.  Encouraged her to keep plans for upcoming CT urogram and we will schedule her for cystoscopy with CT results with Dr. Bernardo Heater thereafter.  Patient is in agreement with this plan. - Urinalysis, Complete - CULTURE, URINE COMPREHENSIVE - Mycoplasma / ureaplasma culture   Return in about 4 weeks (around 08/17/2021) for Cysto and CTU results with Dr. Bernardo Heater, will call with culture results.  Debroah Loop, PA-C  [facility] [address] [place], [place] [address] [phone]

## 2021-07-25 ENCOUNTER — Encounter: Payer: Self-pay | Admitting: *Deleted

## 2021-07-25 ENCOUNTER — Other Ambulatory Visit: Payer: Self-pay | Admitting: *Deleted

## 2021-07-25 LAB — MICROSCOPIC EXAMINATION

## 2021-07-25 LAB — URINALYSIS, COMPLETE
Bilirubin, UA: NEGATIVE
Glucose, UA: NEGATIVE
Leukocytes,UA: NEGATIVE
Nitrite, UA: NEGATIVE
Specific Gravity, UA: 1.02 (ref 1.005–1.030)
Urobilinogen, Ur: 1 mg/dL (ref 0.2–1.0)
pH, UA: 5.5 (ref 5.0–7.5)

## 2021-07-25 LAB — CULTURE, URINE COMPREHENSIVE

## 2021-07-25 MED ORDER — SULFAMETHOXAZOLE-TRIMETHOPRIM 800-160 MG PO TABS: 1.0000 | ORAL_TABLET | Freq: Two times a day (BID) | ORAL | 0 refills | Status: AC

## 2021-07-26 LAB — MYCOPLASMA / UREAPLASMA CULTURE
Mycoplasma hominis Culture: NEGATIVE
Ureaplasma urealyticum: NEGATIVE

## 2021-07-27 ENCOUNTER — Telehealth: Payer: Self-pay

## 2021-07-27 DIAGNOSIS — R3129 Other microscopic hematuria: Secondary | ICD-10-CM

## 2021-07-27 MED ORDER — NITROFURANTOIN MONOHYD MACRO 100 MG PO CAPS: 100.0000 mg | ORAL_CAPSULE | Freq: Two times a day (BID) | ORAL | 0 refills | Status: AC

## 2021-07-27 NOTE — Telephone Encounter (Signed)
[facility] notifying patient of message below. Abx sent to [place]. Advised patient to call office should she have any questions.

## 2021-07-27 NOTE — Telephone Encounter (Signed)
-----   Message from Debroah Loop, [place] sent at 07/27/2021  8:49 AM EST ----- Macrobid 100mg  BID x5 days please. She should continue Flomax and keep plans for CT. ----- Message ----- From: Interface, [facility] Lab Results In Sent: 07/21/2021   7:37 AM EST To: Debroah Loop, PA-C

## 2021-07-28 ENCOUNTER — Telehealth: Payer: Self-pay | Admitting: Physician Assistant

## 2021-07-28 NOTE — Telephone Encounter (Signed)
Pt reports she is experiencing stomach pain, nausea and vomiting. Patient states she took 2 Bactrim and 2 Macrobid yesterday. Advised patient she should only be taking one in the AM and one in the PM along with food to ease the upset stomach. Per Aldona Bar, advised patient to stop the 5 days of Macrobid and finish out the 3 days of Bactrim. Patient expressed understanding.

## 2021-07-28 NOTE — Telephone Encounter (Signed)
Pt called in stating the Bactrim or Macrobid made her very sick last nite. She would like a call back at someones earliest convenience if possible.

## 2021-08-05 IMAGING — MR MRI HEAD WITHOUT CONTRAST
12 series · 45 of 48 positions shown · non-contrast
Comparison: Chest 02/24/2017

CLINICAL DATA: Syncope, unspecified.  History of brain cyst

EXAM:
MRI HEAD WITHOUT CONTRAST
TECHNIQUE: Multiplanar, multiecho pulse sequences of the brain and surrounding
structures were obtained without intravenous contrast.

[Series 5: ax dwi_tracew · axial · 3.0mm · 0.60mm/px · z∈[-120,+35]mm · 4 of 48 slices shown]
[im 1/48]
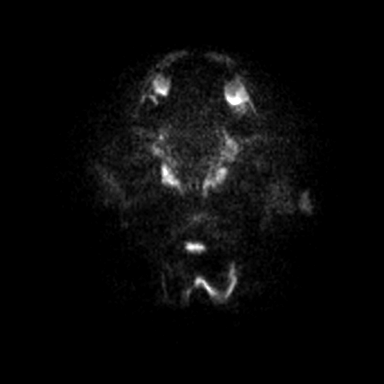
[im 16/48]
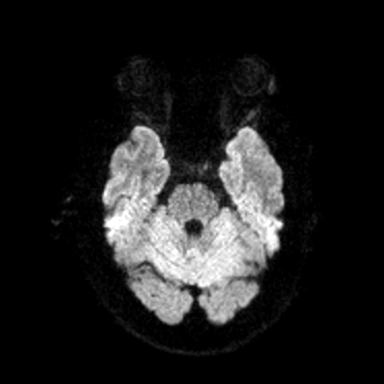
[im 32/48]
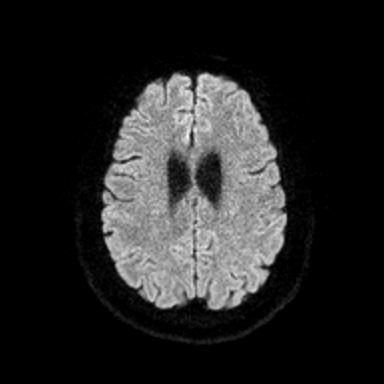
[im 48/48]
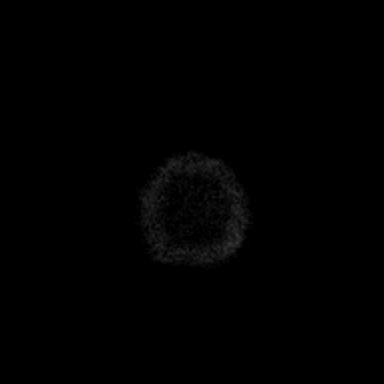

[Series 6: ax dwi_adc · axial · 3.0mm · 0.60mm/px · z∈[-120,+35]mm · 3 of 48 slices shown]
[im 1/48]
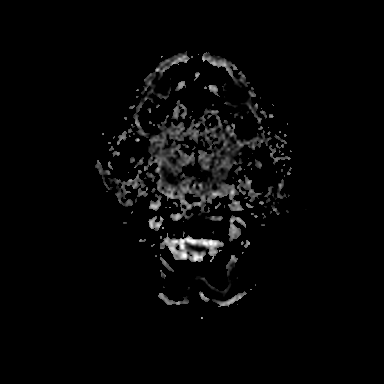
[im 24/48]
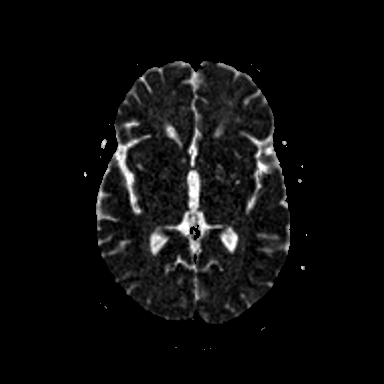
[im 48/48]
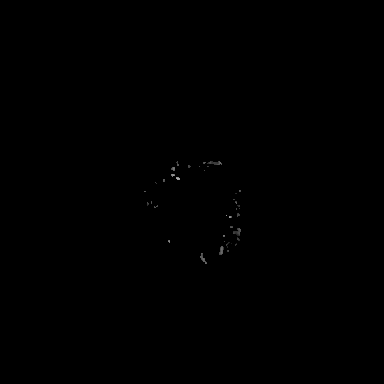

[Series 7: cor dwi_tracew · coronal · 5.0mm · 0.60mm/px · 3 of 38 slices shown]
[im 1/38]
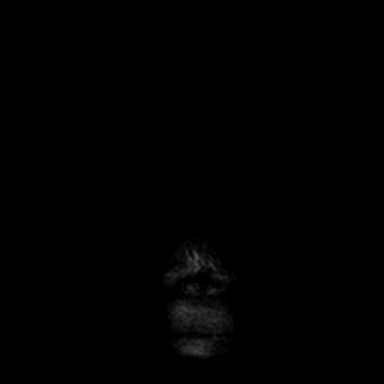
[im 19/38]
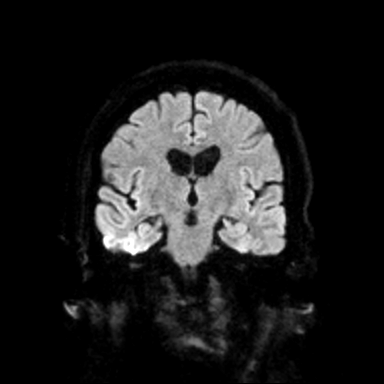
[im 38/38]
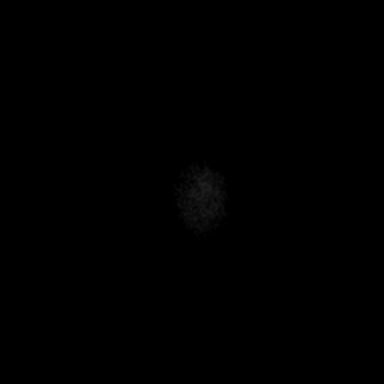

[Series 8: cor dwi_adc · coronal · 5.0mm · 0.60mm/px · 3 of 37 slices shown]
[im 1/37]
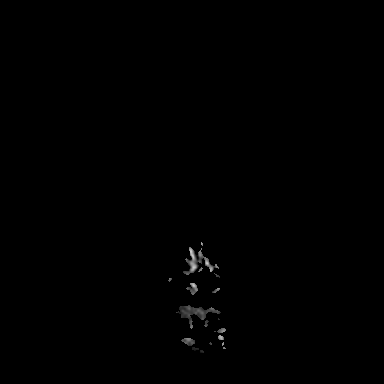
[im 19/37]
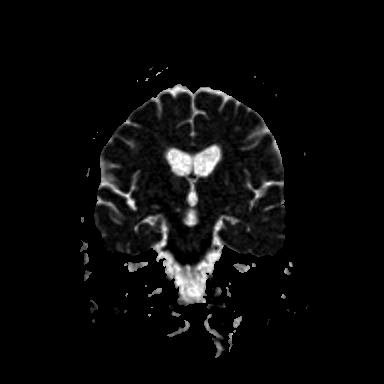
[im 37/37]
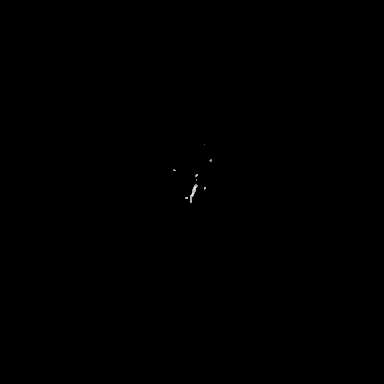

[Series 9: T1 · sagittal · 5.0mm · 0.62mm/px · 2 of 25 slices shown (1 of 2)]
[im 1/25]
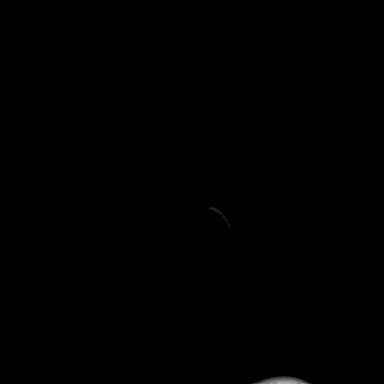
[im 25/25]
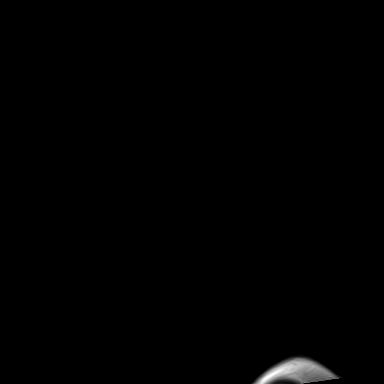

[Series 10: T2 · axial · 5.0mm · 0.53mm/px · z∈[-117,+27]mm · 2 of 25 slices shown (1 of 2)]
[im 1/25]
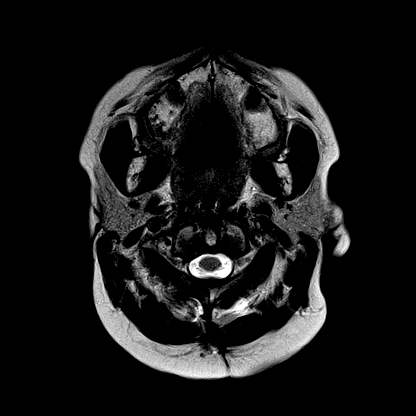
[im 25/25]
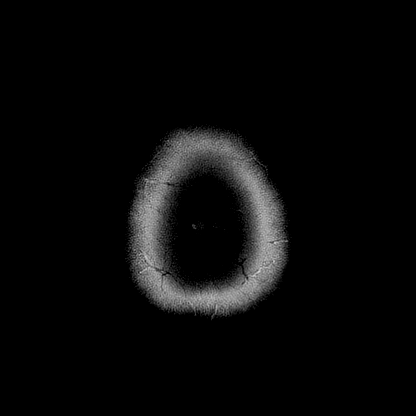

[Series 11: mag_images · axial · 3.0mm · 0.90mm/px · z∈[-133,+44]mm · 4 of 60 slices shown]
[im 1/60]
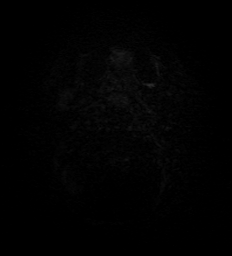
[im 20/60]
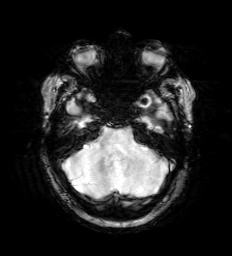
[im 40/60]
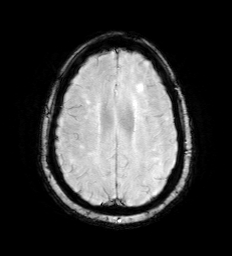
[im 60/60]
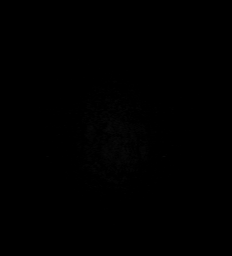

[Series 12: pha_images · axial · 3.0mm · 0.90mm/px · z∈[-133,+44]mm · 4 of 60 slices shown]
[im 1/60]
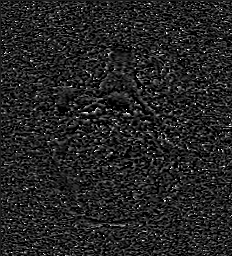
[im 20/60]
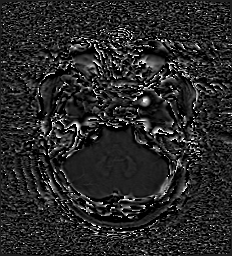
[im 40/60]
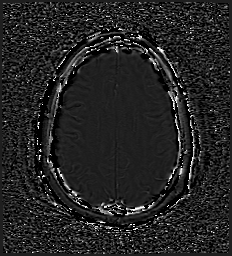
[im 60/60]
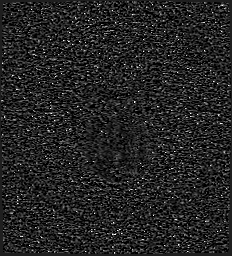

[Series 13: swi_images · axial · 3.0mm · 0.90mm/px · z∈[-133,+44]mm · 4 of 60 slices shown]
[im 1/60]
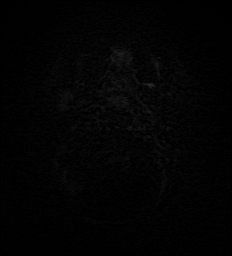
[im 20/60]
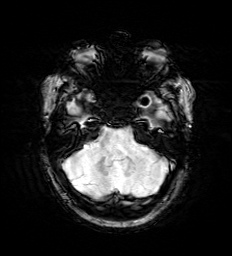
[im 40/60]
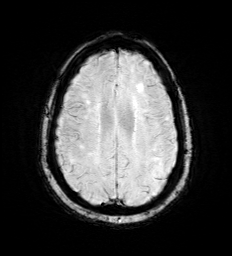
[im 60/60]
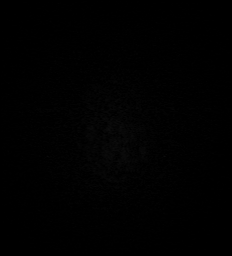

[Series 15: FLAIR · axial · 3.0mm · 0.53mm/px · z∈[-126,+36]mm · 4 of 55 slices shown]
[im 1/55]
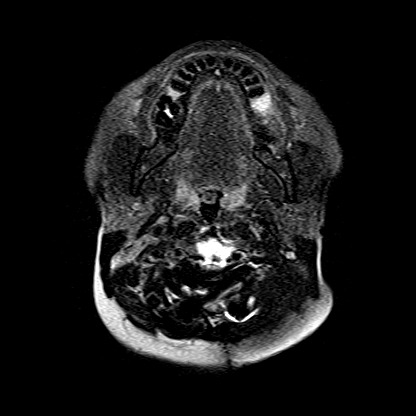
[im 19/55]
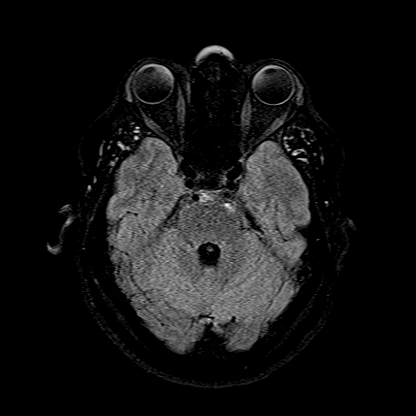
[im 37/55]
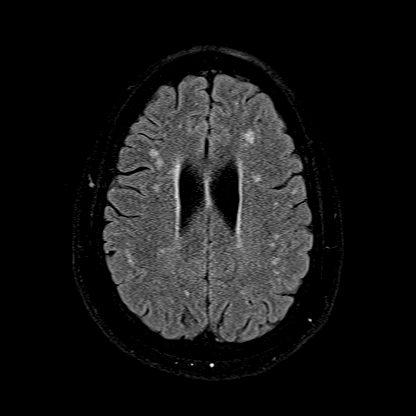
[im 55/55]
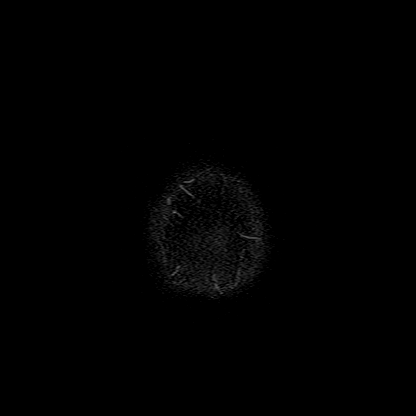

[Series 16: T1 · axial · 1.0mm · 0.98mm/px · z∈[-131,+43]mm · 10 of 176 slices shown (2 of 2)]
[im 1/176]
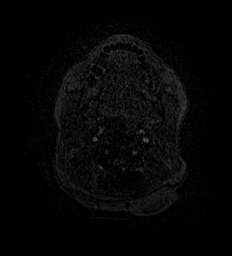
[im 15/176]
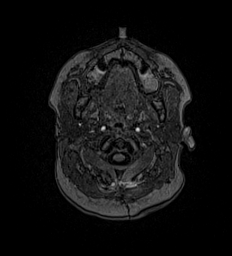
[im 30/176]
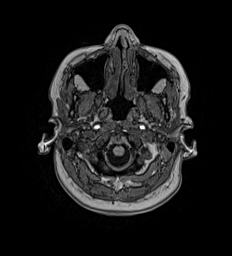
[im 44/176]
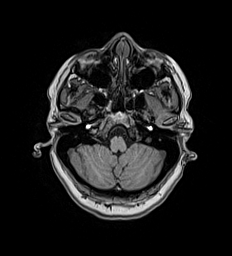
[im 59/176]
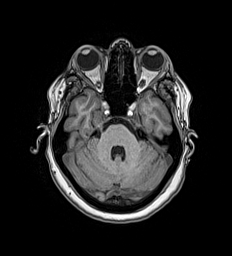
[im 73/176]
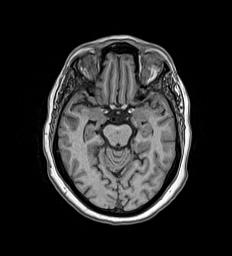
[im 103/176]
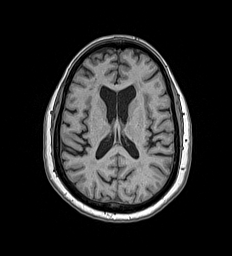
[im 117/176]
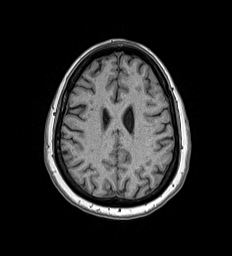
[im 146/176]
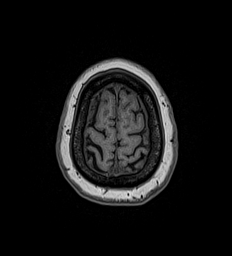
[im 176/176]
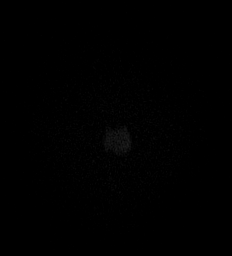

[Series 17: T2 · coronal · 5.0mm · 0.57mm/px · 2 of 29 slices shown (2 of 2)]
[im 1/29]
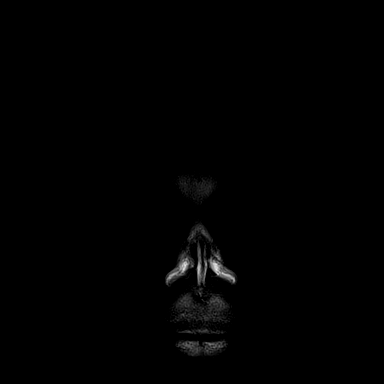
[im 29/29]
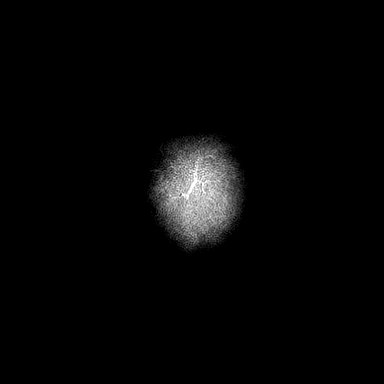

[45 of 48 positions shown; findings below may reference images not displayed]

FINDINGS: Brain: No acute infarction, hemorrhage, hydrocephalus, extra-axial
collection or mass lesion. Dilated perivascular space below the left
putamen, the presumed correlate for history of brain cyst. Small
FLAIR hyperintensities in the cerebral white matter, overall mild in
extent and stable from prior. No specific demyelinating pattern.
Negative for atrophy.

Vascular: Major flow voids are preserved, including vertebrobasilar.

Skull and upper cervical spine: Extensive cervical spine fusion.
Negative for marrow lesion

Sinuses/Orbits: Normal.
IMPRESSION: 1. No acute or reversible finding and stable from 6296.
2. Mild white matter disease with nonspecific pattern. This may be
chronic small vessel ischemia given history of hypertension.

## 2021-08-05 IMAGING — CR ABDOMEN - 1 VIEW
1 series · 1 of 1 positions shown · non-contrast
Comparison: 09/29/2018

CLINICAL DATA: Nephrolithiasis

EXAM:
ABDOMEN - 1 VIEW

[t abdomen supine]
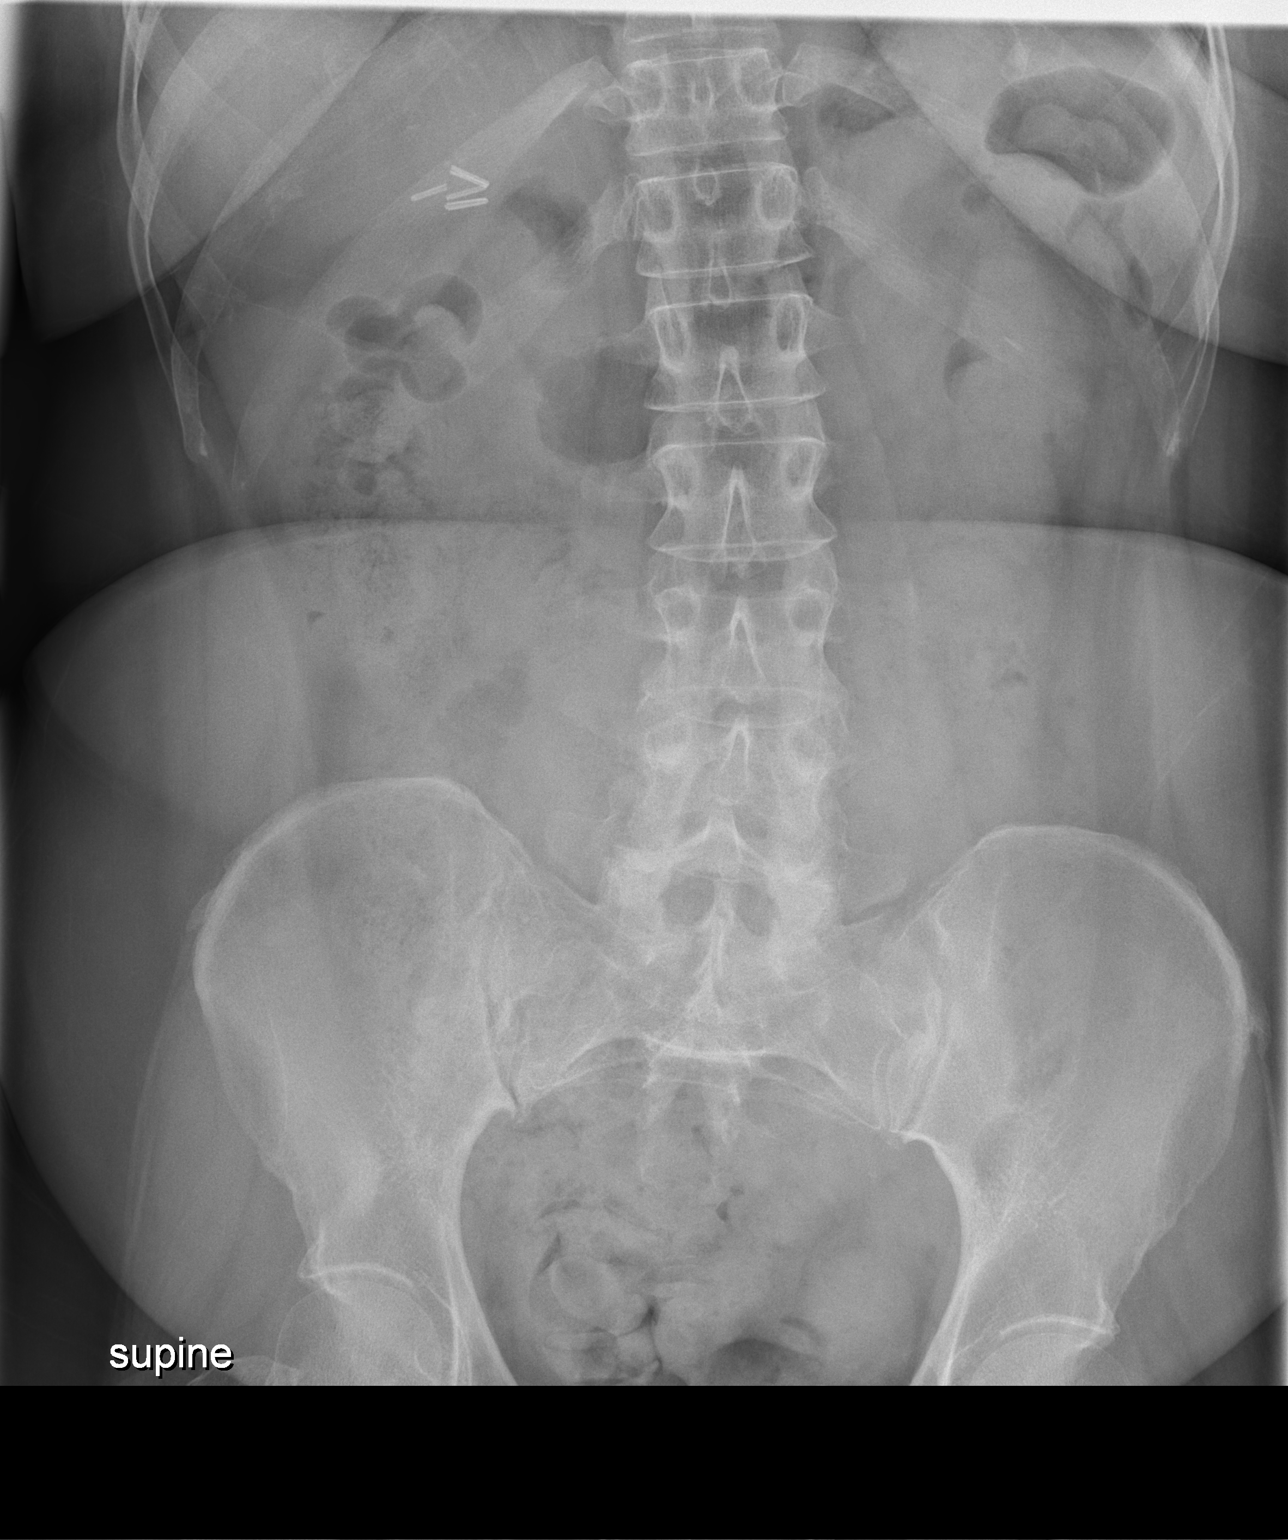

[1 of 1 positions shown; findings below may reference images not displayed]

FINDINGS: No visible renal or ureteral stones. Calcified phleboliths in the
lower pelvis. Prior cholecystectomy. Normal bowel gas pattern. No
organomegaly.
IMPRESSION: Negative.

## 2021-08-10 ENCOUNTER — Inpatient Hospital Stay
Admission: EM | Admit: 2021-08-10 | Discharge: 2021-08-13 | DRG: 690 | Disposition: A | Discharge status: Home / Self Care | Payer: Medicare Other | Attending: Internal Medicine | Admitting: Internal Medicine

## 2021-08-10 ENCOUNTER — Emergency Department: Payer: Medicare Other

## 2021-08-10 ENCOUNTER — Other Ambulatory Visit: Payer: Self-pay

## 2021-08-10 DIAGNOSIS — Z91013 Allergy to seafood: Secondary | ICD-10-CM

## 2021-08-10 DIAGNOSIS — Z8249 Family history of ischemic heart disease and other diseases of the circulatory system: Secondary | ICD-10-CM

## 2021-08-10 DIAGNOSIS — R1032 Left lower quadrant pain: Secondary | ICD-10-CM | POA: Diagnosis present

## 2021-08-10 DIAGNOSIS — M1990 Unspecified osteoarthritis, unspecified site: Secondary | ICD-10-CM | POA: Diagnosis present

## 2021-08-10 DIAGNOSIS — E039 Hypothyroidism, unspecified: Secondary | ICD-10-CM | POA: Diagnosis present

## 2021-08-10 DIAGNOSIS — Z90710 Acquired absence of both cervix and uterus: Secondary | ICD-10-CM

## 2021-08-10 DIAGNOSIS — K219 Gastro-esophageal reflux disease without esophagitis: Secondary | ICD-10-CM | POA: Diagnosis present

## 2021-08-10 DIAGNOSIS — Z888 Allergy status to other drugs, medicaments and biological substances status: Secondary | ICD-10-CM

## 2021-08-10 DIAGNOSIS — Z981 Arthrodesis status: Secondary | ICD-10-CM

## 2021-08-10 DIAGNOSIS — F419 Anxiety disorder, unspecified: Secondary | ICD-10-CM | POA: Diagnosis present

## 2021-08-10 DIAGNOSIS — N12 Tubulo-interstitial nephritis, not specified as acute or chronic: Secondary | ICD-10-CM

## 2021-08-10 DIAGNOSIS — F32A Depression, unspecified: Secondary | ICD-10-CM | POA: Diagnosis present

## 2021-08-10 DIAGNOSIS — I10 Essential (primary) hypertension: Secondary | ICD-10-CM | POA: Diagnosis present

## 2021-08-10 DIAGNOSIS — Z20822 Contact with and (suspected) exposure to covid-19: Secondary | ICD-10-CM | POA: Diagnosis present

## 2021-08-10 DIAGNOSIS — Z9049 Acquired absence of other specified parts of digestive tract: Secondary | ICD-10-CM

## 2021-08-10 DIAGNOSIS — N3090 Cystitis, unspecified without hematuria: Principal | ICD-10-CM | POA: Diagnosis present

## 2021-08-10 DIAGNOSIS — Z91010 Allergy to peanuts: Secondary | ICD-10-CM

## 2021-08-10 DIAGNOSIS — M549 Dorsalgia, unspecified: Secondary | ICD-10-CM | POA: Diagnosis present

## 2021-08-10 DIAGNOSIS — K5900 Constipation, unspecified: Secondary | ICD-10-CM | POA: Diagnosis present

## 2021-08-10 DIAGNOSIS — G894 Chronic pain syndrome: Secondary | ICD-10-CM | POA: Diagnosis present

## 2021-08-10 DIAGNOSIS — Z79899 Other long term (current) drug therapy: Secondary | ICD-10-CM

## 2021-08-10 DIAGNOSIS — A419 Sepsis, unspecified organism: Secondary | ICD-10-CM | POA: Diagnosis present

## 2021-08-10 DIAGNOSIS — Z885 Allergy status to narcotic agent status: Secondary | ICD-10-CM

## 2021-08-10 DIAGNOSIS — G629 Polyneuropathy, unspecified: Secondary | ICD-10-CM | POA: Diagnosis present

## 2021-08-10 DIAGNOSIS — R590 Localized enlarged lymph nodes: Secondary | ICD-10-CM

## 2021-08-10 DIAGNOSIS — Z79890 Hormone replacement therapy (postmenopausal): Secondary | ICD-10-CM

## 2021-08-10 LAB — COMPREHENSIVE METABOLIC PANEL
ALT: 12 U/L (ref 0–44)
AST: 20 U/L (ref 15–41)
Albumin: 3.3 g/dL — ABNORMAL LOW (ref 3.5–5.0)
Alkaline Phosphatase: 58 U/L (ref 38–126)
Anion gap: 8 (ref 5–15)
BUN: 14 mg/dL (ref 6–20)
CO2: 26 mmol/L (ref 22–32)
Calcium: 9.1 mg/dL (ref 8.9–10.3)
Chloride: 102 mmol/L (ref 98–111)
Creatinine, Ser: 1.05 mg/dL — ABNORMAL HIGH (ref 0.44–1.00)
GFR, Estimated: >60 mL/min (ref >60)
Glucose, Bld: 113 mg/dL — ABNORMAL HIGH (ref 70–99)
Potassium: 3.9 mmol/L (ref 3.5–5.1)
Sodium: 136 mmol/L (ref 135–145)
Total Bilirubin: 0.7 mg/dL (ref 0.3–1.2)
Total Protein: 7.2 g/dL (ref 6.5–8.1)

## 2021-08-10 LAB — CBC
HCT: 38.2 % (ref 36.0–46.0)
Hemoglobin: 12.6 g/dL (ref 12.0–15.0)
MCH: 31.5 pg (ref 26.0–34.0)
MCHC: 33 g/dL (ref 30.0–36.0)
MCV: 95.5 fL (ref 80.0–100.0)
Platelets: 303 10*3/uL (ref 150–400)
RBC: 4 MIL/uL (ref 3.87–5.11)
RDW: 12.2 % (ref 11.5–15.5)
WBC: 10.2 10*3/uL (ref 4.0–10.5)
nRBC: 0 % (ref 0.0–0.2)

## 2021-08-10 LAB — LIPASE, BLOOD: Lipase: 34 U/L (ref 11–51)

## 2021-08-10 MED ORDER — IOHEXOL 300 MG/ML  SOLN
100.0000 mL | Freq: Once | INTRAMUSCULAR | Status: AC | PRN
  Administered 2021-08-10: 100 mL via INTRAVENOUS

## 2021-08-10 MED ORDER — ONDANSETRON HCL 4 MG/2ML IJ SOLN
4.0000 mg | Freq: Once | INTRAMUSCULAR | Status: AC
  Administered 2021-08-10: 4 mg via INTRAVENOUS
  Filled 2021-08-10: qty 2

## 2021-08-10 MED ORDER — MORPHINE SULFATE (PF) 4 MG/ML IV SOLN
4.0000 mg | Freq: Once | INTRAVENOUS | Status: AC
  Administered 2021-08-10: 4 mg via INTRAVENOUS
  Filled 2021-08-10: qty 1

## 2021-08-10 MED ORDER — LACTATED RINGERS IV BOLUS
1000.0000 mL | Freq: Once | INTRAVENOUS | Status: AC
  Administered 2021-08-10: 1000 mL via INTRAVENOUS

## 2021-08-10 NOTE — ED Triage Notes (Signed)
Pt to ED via POV from [facility]. Pt c/o sharp LLQ abdominal pain and nausea that started x2 days ago. Pt seen at [facility] and referred to ED. Pt denies CP or SOB.

## 2021-08-10 NOTE — ED Provider Notes (Signed)
,  [facility] Provider Note    Event Date/Time   First MD Initiated Contact with Patient 08/10/21 2213     (approximate)   History   Chief Complaint Abdominal Pain   HPI  Kimberly [name] is a 61 y.o. female with past medical history of hypertension, GERD, asthma, and chronic pain syndrome who presents to the ED complaining of abdominal pain.  Patient reports that she has had constant pain in the left lower quadrant of her abdomen for the past 48 hours.  She describes the pain as sharp and stabbing worse when she goes to take a deep breath or moves in a certain way.  At times it will seem to radiate up towards her left flank, but she denies any dysuria or hematuria.  She has felt nauseous, but has not vomited or had any diarrhea or constipation.  She denies any history of similar symptoms, denies abdominal surgeries outside of a hysterectomy.     Physical Exam   Triage Vital Signs: ED Triage Vitals  Enc Vitals Group     BP 08/10/21 1740 117/85     Pulse Rate 08/10/21 1740 88     Resp 08/10/21 1740 18     Temp 08/10/21 1740 98.1 F (36.7 C)     Temp Source 08/10/21 1740 Oral     SpO2 08/10/21 1740 96 %     Weight 08/10/21 1736 146 lb (66.2 kg)     Height 08/10/21 1736 5\' 1"  (1.549 m)     Head Circumference --      Peak Flow --      Pain Score 08/10/21 1736 8     Pain Loc --      Pain Edu? --      Excl. in [place]? --     Most recent vital signs: Vitals:   08/10/21 1740  BP: 117/85  Pulse: 88  Resp: 18  Temp: 98.1 F (36.7 C)  SpO2: 96%    Constitutional: Alert and oriented. Eyes: Conjunctivae are normal. Head: Atraumatic. Nose: No congestion/rhinnorhea. Mouth/Throat: Mucous membranes are moist. Cardiovascular: Normal rate, regular rhythm. Grossly normal heart sounds.  2+ radial pulses bilaterally. Respiratory: Normal respiratory effort.  No retractions. Lungs CTAB. Gastrointestinal: Soft and tender to palpation in the left lower quadrant  with no rebound or guarding. No distention. Musculoskeletal: No lower extremity tenderness nor edema.  Neurologic:  Normal speech and language. No gross focal neurologic deficits are appreciated.    ED Results / Procedures / Treatments   Labs (all labs ordered are listed, but only abnormal results are displayed) Labs Reviewed  COMPREHENSIVE METABOLIC PANEL - Abnormal; Notable for the following components:      Result Value   Glucose, Bld 113 (*)    Creatinine, Ser 1.05 (*)    Albumin 3.3 (*)    All other components within normal limits  LIPASE, BLOOD  CBC  URINALYSIS, ROUTINE W REFLEX MICROSCOPIC    RADIOLOGY CT reviewed by me with no obvious inflammatory stranding in the area of left lower quadrant and no obvious kidney stone, radiology read pending.  PROCEDURES:  Critical Care performed: No  Procedures   MEDICATIONS ORDERED IN ED: Medications  morphine 4 MG/ML injection 4 mg (4 mg Intravenous Given 08/10/21 2320)  ondansetron (ZOFRAN) injection 4 mg (4 mg Intravenous Given 08/10/21 2320)  lactated ringers bolus 1,000 mL (1,000 mLs Intravenous New Bag/Given 08/10/21 2320)  iohexol (OMNIPAQUE) 300 MG/ML solution 100 mL (100 mLs Intravenous Contrast  Given 08/10/21 2333)     IMPRESSION / MDM / ASSESSMENT AND PLAN / ED COURSE  I reviewed the triage vital signs and the nursing notes.                              61 y.o. female with past medical history of hypertension, GERD, asthma, and chronic pain syndrome who presents to the ED complaining of 2 days of constant pain in the left lower quadrant associated with nausea but no urinary symptoms, vomiting, or changes in bowel movements.  Differential diagnosis includes, but is not limited to, diverticulitis, kidney stone, UTI, colitis, gastroenteritis.  Patient is nontoxic-appearing and in no acute distress, does have tenderness to palpation focally in her left lower quadrant.  Vital signs and labs are reassuring, CBC and CMP  show no anemia, electrolyte abnormality, or abnormal liver function.  We will further assess with CT scan for diverticulitis or kidney stone, treat symptomatically with IV morphine and Zofran.  UA is also pending at this time.  Patient turned over to oncoming provider pending CT results and UA.       FINAL CLINICAL IMPRESSION(S) / ED DIAGNOSES   Final diagnoses:  Left lower quadrant abdominal pain     Rx / DC Orders   ED Discharge Orders     None        Note:  This document was prepared using Dragon voice recognition software and may include unintentional dictation errors.   Blake Divine, MD 08/10/21 [phone]

## 2021-08-11 ENCOUNTER — Inpatient Hospital Stay: Payer: Medicare Other

## 2021-08-11 ENCOUNTER — Ambulatory Visit: Payer: Commercial Managed Care - HMO

## 2021-08-11 DIAGNOSIS — Z91013 Allergy to seafood: Secondary | ICD-10-CM | POA: Diagnosis not present

## 2021-08-11 DIAGNOSIS — A419 Sepsis, unspecified organism: Secondary | ICD-10-CM | POA: Diagnosis present

## 2021-08-11 DIAGNOSIS — G629 Polyneuropathy, unspecified: Secondary | ICD-10-CM | POA: Diagnosis present

## 2021-08-11 DIAGNOSIS — R590 Localized enlarged lymph nodes: Secondary | ICD-10-CM | POA: Diagnosis present

## 2021-08-11 DIAGNOSIS — I10 Essential (primary) hypertension: Secondary | ICD-10-CM | POA: Diagnosis present

## 2021-08-11 DIAGNOSIS — G894 Chronic pain syndrome: Secondary | ICD-10-CM | POA: Diagnosis present

## 2021-08-11 DIAGNOSIS — Z20822 Contact with and (suspected) exposure to covid-19: Secondary | ICD-10-CM | POA: Diagnosis present

## 2021-08-11 DIAGNOSIS — Z981 Arthrodesis status: Secondary | ICD-10-CM | POA: Diagnosis not present

## 2021-08-11 DIAGNOSIS — F32A Depression, unspecified: Secondary | ICD-10-CM | POA: Diagnosis present

## 2021-08-11 DIAGNOSIS — J101 Influenza due to other identified influenza virus with other respiratory manifestations: Secondary | ICD-10-CM

## 2021-08-11 DIAGNOSIS — Z9049 Acquired absence of other specified parts of digestive tract: Secondary | ICD-10-CM | POA: Diagnosis not present

## 2021-08-11 DIAGNOSIS — M1990 Unspecified osteoarthritis, unspecified site: Secondary | ICD-10-CM | POA: Diagnosis present

## 2021-08-11 DIAGNOSIS — E039 Hypothyroidism, unspecified: Secondary | ICD-10-CM | POA: Diagnosis present

## 2021-08-11 DIAGNOSIS — N12 Tubulo-interstitial nephritis, not specified as acute or chronic: Secondary | ICD-10-CM

## 2021-08-11 DIAGNOSIS — R1032 Left lower quadrant pain: Secondary | ICD-10-CM | POA: Diagnosis not present

## 2021-08-11 DIAGNOSIS — F419 Anxiety disorder, unspecified: Secondary | ICD-10-CM | POA: Diagnosis present

## 2021-08-11 DIAGNOSIS — Z885 Allergy status to narcotic agent status: Secondary | ICD-10-CM | POA: Diagnosis not present

## 2021-08-11 DIAGNOSIS — Z91010 Allergy to peanuts: Secondary | ICD-10-CM | POA: Diagnosis not present

## 2021-08-11 DIAGNOSIS — Z79890 Hormone replacement therapy (postmenopausal): Secondary | ICD-10-CM | POA: Diagnosis not present

## 2021-08-11 DIAGNOSIS — Z888 Allergy status to other drugs, medicaments and biological substances status: Secondary | ICD-10-CM | POA: Diagnosis not present

## 2021-08-11 DIAGNOSIS — N3090 Cystitis, unspecified without hematuria: Secondary | ICD-10-CM | POA: Diagnosis present

## 2021-08-11 DIAGNOSIS — Z79899 Other long term (current) drug therapy: Secondary | ICD-10-CM | POA: Diagnosis not present

## 2021-08-11 DIAGNOSIS — Z8249 Family history of ischemic heart disease and other diseases of the circulatory system: Secondary | ICD-10-CM | POA: Diagnosis not present

## 2021-08-11 DIAGNOSIS — K5900 Constipation, unspecified: Secondary | ICD-10-CM | POA: Diagnosis present

## 2021-08-11 DIAGNOSIS — K219 Gastro-esophageal reflux disease without esophagitis: Secondary | ICD-10-CM | POA: Diagnosis present

## 2021-08-11 DIAGNOSIS — M549 Dorsalgia, unspecified: Secondary | ICD-10-CM | POA: Diagnosis present

## 2021-08-11 DIAGNOSIS — Z90710 Acquired absence of both cervix and uterus: Secondary | ICD-10-CM | POA: Diagnosis not present

## 2021-08-11 LAB — BASIC METABOLIC PANEL
Anion gap: 9 (ref 5–15)
BUN: 10 mg/dL (ref 6–20)
CO2: 28 mmol/L (ref 22–32)
Calcium: 8.8 mg/dL — ABNORMAL LOW (ref 8.9–10.3)
Chloride: 101 mmol/L (ref 98–111)
Creatinine, Ser: 0.93 mg/dL (ref 0.44–1.00)
GFR, Estimated: >60 mL/min (ref >60)
Glucose, Bld: 106 mg/dL — ABNORMAL HIGH (ref 70–99)
Potassium: 3.8 mmol/L (ref 3.5–5.1)
Sodium: 138 mmol/L (ref 135–145)

## 2021-08-11 LAB — CBC
HCT: 35.9 % — ABNORMAL LOW (ref 36.0–46.0)
Hemoglobin: 11.7 g/dL — ABNORMAL LOW (ref 12.0–15.0)
MCH: 31.4 pg (ref 26.0–34.0)
MCHC: 32.6 g/dL (ref 30.0–36.0)
MCV: 96.2 fL (ref 80.0–100.0)
Platelets: 272 10*3/uL (ref 150–400)
RBC: 3.73 MIL/uL — ABNORMAL LOW (ref 3.87–5.11)
RDW: 12.4 % (ref 11.5–15.5)
WBC: 12.2 10*3/uL — ABNORMAL HIGH (ref 4.0–10.5)
nRBC: 0 % (ref 0.0–0.2)

## 2021-08-11 LAB — RESP PANEL BY RT-PCR (FLU A&B, COVID) ARPGX2
Influenza A by PCR: NEGATIVE
Influenza B by PCR: NEGATIVE
SARS Coronavirus 2 by RT PCR: NEGATIVE

## 2021-08-11 LAB — URINALYSIS, ROUTINE W REFLEX MICROSCOPIC
Bilirubin Urine: NEGATIVE
Glucose, UA: NEGATIVE mg/dL
Ketones, ur: NEGATIVE mg/dL
Leukocytes,Ua: NEGATIVE
Nitrite: NEGATIVE
Protein, ur: NEGATIVE mg/dL
Specific Gravity, Urine: 1.009 (ref 1.005–1.030)
pH: 6 (ref 5.0–8.0)

## 2021-08-11 LAB — HIV ANTIBODY (ROUTINE TESTING W REFLEX): HIV Screen 4th Generation wRfx: NONREACTIVE

## 2021-08-11 MED ORDER — VITAMIN D3 25 MCG (1000 UNIT) PO TABS
1000.0000 [IU] | ORAL_TABLET | Freq: Every day | ORAL | Status: DC
  Administered 2021-08-11 – 2021-08-13 (×3): 1000 [IU] via ORAL
  Filled 2021-08-11 (×5): qty 1

## 2021-08-11 MED ORDER — SUMATRIPTAN SUCCINATE 50 MG PO TABS
50.0000 mg | ORAL_TABLET | ORAL | Status: DC | PRN
  Filled 2021-08-11 (×2): qty 1

## 2021-08-11 MED ORDER — SODIUM CHLORIDE 0.9 % IV SOLN
1.0000 g | INTRAVENOUS | Status: DC
  Administered 2021-08-11 – 2021-08-13 (×3): 1 g via INTRAVENOUS
  Filled 2021-08-11: qty 1
  Filled 2021-08-11 (×3): qty 10

## 2021-08-11 MED ORDER — POLYETHYLENE GLYCOL 3350 17 G PO PACK
17.0000 g | PACK | Freq: Two times a day (BID) | ORAL | Status: DC
  Administered 2021-08-11 (×2): 17 g via ORAL
  Filled 2021-08-11 (×4): qty 1

## 2021-08-11 MED ORDER — VIBEGRON 75 MG PO TABS
75.0000 mg | ORAL_TABLET | Freq: Every day | ORAL | Status: DC
Start: 2021-08-11 — End: 2021-08-11

## 2021-08-11 MED ORDER — METRONIDAZOLE 500 MG/100ML IV SOLN
500.0000 mg | Freq: Three times a day (TID) | INTRAVENOUS | Status: DC
  Administered 2021-08-11 – 2021-08-13 (×8): 500 mg via INTRAVENOUS
  Filled 2021-08-11 (×9): qty 100

## 2021-08-11 MED ORDER — ACETAMINOPHEN 650 MG RE SUPP: 650.0000 mg | Freq: Four times a day (QID) | RECTAL | Status: DC | PRN

## 2021-08-11 MED ORDER — ESTRADIOL 1 MG PO TABS
1.5000 mg | ORAL_TABLET | Freq: Every day | ORAL | Status: DC
  Administered 2021-08-11 – 2021-08-13 (×3): 1.5 mg via ORAL
  Filled 2021-08-11 (×3): qty 1.5

## 2021-08-11 MED ORDER — AMLODIPINE BESYLATE 5 MG PO TABS
5.0000 mg | ORAL_TABLET | Freq: Every day | ORAL | Status: DC
  Administered 2021-08-11 – 2021-08-13 (×3): 5 mg via ORAL
  Filled 2021-08-11 (×3): qty 1

## 2021-08-11 MED ORDER — IOHEXOL 300 MG/ML  SOLN
100.0000 mL | Freq: Once | INTRAMUSCULAR | Status: AC | PRN
  Administered 2021-08-11: 100 mL via INTRAVENOUS

## 2021-08-11 MED ORDER — ACETAMINOPHEN 325 MG PO TABS
650.0000 mg | ORAL_TABLET | Freq: Four times a day (QID) | ORAL | Status: DC | PRN
  Administered 2021-08-11 – 2021-08-13 (×5): 650 mg via ORAL
  Filled 2021-08-11 (×5): qty 2

## 2021-08-11 MED ORDER — MELOXICAM 7.5 MG PO TABS: 7.5000 mg | ORAL_TABLET | Freq: Two times a day (BID) | ORAL | Status: DC

## 2021-08-11 MED ORDER — IOHEXOL 9 MG/ML PO SOLN
1000.0000 mL | Freq: Once | ORAL | Status: AC | PRN
  Administered 2021-08-11: 1000 mL via ORAL
  Filled 2021-08-11: qty 1000

## 2021-08-11 MED ORDER — METHOCARBAMOL 500 MG PO TABS: 500.0000 mg | ORAL_TABLET | Freq: Four times a day (QID) | ORAL | Status: DC | PRN

## 2021-08-11 MED ORDER — TETRAHYDROZOLINE HCL 0.05 % OP SOLN
1.0000 [drp] | Freq: Every day | OPHTHALMIC | Status: DC | PRN
  Filled 2021-08-11: qty 15

## 2021-08-11 MED ORDER — ONDANSETRON HCL 4 MG/2ML IJ SOLN
4.0000 mg | Freq: Four times a day (QID) | INTRAMUSCULAR | Status: DC | PRN
  Administered 2021-08-11: 4 mg via INTRAVENOUS
  Filled 2021-08-11: qty 2

## 2021-08-11 MED ORDER — LIOTHYRONINE SODIUM 5 MCG PO TABS
5.0000 ug | ORAL_TABLET | Freq: Every day | ORAL | Status: DC
  Administered 2021-08-11 – 2021-08-13 (×3): 5 ug via ORAL
  Filled 2021-08-11 (×3): qty 1

## 2021-08-11 MED ORDER — IOHEXOL 300 MG/ML  SOLN
100.0000 mL | Freq: Once | INTRAMUSCULAR | Status: DC | PRN
  Filled 2021-08-11: qty 100

## 2021-08-11 MED ORDER — PIPERACILLIN-TAZOBACTAM 3.375 G IVPB 30 MIN
3.3750 g | Freq: Once | INTRAVENOUS | Status: AC
  Administered 2021-08-11: 3.375 g via INTRAVENOUS
  Filled 2021-08-11: qty 50

## 2021-08-11 MED ORDER — OSELTAMIVIR PHOSPHATE 75 MG PO CAPS
75.0000 mg | ORAL_CAPSULE | Freq: Two times a day (BID) | ORAL | Status: DC
  Administered 2021-08-11: 75 mg via ORAL
  Filled 2021-08-11 (×3): qty 1

## 2021-08-11 MED ORDER — IOHEXOL 9 MG/ML PO SOLN
500.0000 mL | ORAL | Status: AC
  Filled 2021-08-11 (×2): qty 500

## 2021-08-11 MED ORDER — ESTRADIOL 1 MG PO TABS
0.5000 mg | ORAL_TABLET | Freq: Every day | ORAL | Status: DC
  Filled 2021-08-11: qty 0.5

## 2021-08-11 MED ORDER — ESCITALOPRAM OXALATE 10 MG PO TABS
10.0000 mg | ORAL_TABLET | Freq: Every day | ORAL | Status: DC | PRN
  Filled 2021-08-11: qty 1

## 2021-08-11 MED ORDER — NORTRIPTYLINE HCL 10 MG PO CAPS
10.0000 mg | ORAL_CAPSULE | Freq: Every evening | ORAL | Status: DC | PRN
  Filled 2021-08-11: qty 1

## 2021-08-11 MED ORDER — VALACYCLOVIR HCL 500 MG PO TABS
1000.0000 mg | ORAL_TABLET | Freq: Every day | ORAL | Status: DC
  Administered 2021-08-11 – 2021-08-13 (×3): 1000 mg via ORAL
  Filled 2021-08-11 (×3): qty 2

## 2021-08-11 MED ORDER — TAMSULOSIN HCL 0.4 MG PO CAPS: 0.4000 mg | ORAL_CAPSULE | Freq: Every day | ORAL | Status: DC

## 2021-08-11 MED ORDER — MIRABEGRON ER 25 MG PO TB24
25.0000 mg | ORAL_TABLET | Freq: Every day | ORAL | Status: DC
  Administered 2021-08-11 – 2021-08-13 (×3): 25 mg via ORAL
  Filled 2021-08-11 (×3): qty 1

## 2021-08-11 MED ORDER — PANTOPRAZOLE SODIUM 40 MG PO TBEC
40.0000 mg | DELAYED_RELEASE_TABLET | Freq: Every day | ORAL | Status: DC
  Administered 2021-08-11 – 2021-08-13 (×3): 40 mg via ORAL
  Filled 2021-08-11 (×3): qty 1

## 2021-08-11 MED ORDER — MAGNESIUM HYDROXIDE 400 MG/5ML PO SUSP
30.0000 mL | Freq: Every day | ORAL | Status: DC | PRN
  Filled 2021-08-11: qty 30

## 2021-08-11 MED ORDER — SODIUM CHLORIDE 0.9 % IV SOLN: INTRAVENOUS | Status: DC

## 2021-08-11 MED ORDER — ALPRAZOLAM 0.5 MG PO TABS: 0.5000 mg | ORAL_TABLET | Freq: Three times a day (TID) | ORAL | Status: DC | PRN

## 2021-08-11 MED ORDER — ONDANSETRON HCL 4 MG PO TABS
4.0000 mg | ORAL_TABLET | Freq: Four times a day (QID) | ORAL | Status: DC | PRN
  Administered 2021-08-13: 16:00:00 4 mg via ORAL
  Filled 2021-08-11: qty 1

## 2021-08-11 MED ORDER — POLYETHYLENE GLYCOL 3350 17 G PO PACK: 17.0000 g | PACK | Freq: Every day | ORAL | Status: DC

## 2021-08-11 MED ORDER — DULOXETINE HCL 30 MG PO CPEP: 30.0000 mg | ORAL_CAPSULE | Freq: Every day | ORAL | Status: DC

## 2021-08-11 MED ORDER — ENOXAPARIN SODIUM 40 MG/0.4ML IJ SOSY
40.0000 mg | PREFILLED_SYRINGE | INTRAMUSCULAR | Status: DC
  Administered 2021-08-11 – 2021-08-13 (×3): 40 mg via SUBCUTANEOUS
  Filled 2021-08-11 (×3): qty 0.4

## 2021-08-11 MED ORDER — OXYCODONE HCL 5 MG PO TABS
10.0000 mg | ORAL_TABLET | Freq: Three times a day (TID) | ORAL | Status: DC | PRN
  Administered 2021-08-11 – 2021-08-13 (×5): 10 mg via ORAL
  Filled 2021-08-11 (×5): qty 2

## 2021-08-11 MED ORDER — GABAPENTIN 300 MG PO CAPS
300.0000 mg | ORAL_CAPSULE | Freq: Three times a day (TID) | ORAL | Status: DC
  Administered 2021-08-11 – 2021-08-13 (×8): 300 mg via ORAL
  Filled 2021-08-11 (×8): qty 1

## 2021-08-11 MED ORDER — TRAZODONE HCL 50 MG PO TABS: 25.0000 mg | ORAL_TABLET | Freq: Every evening | ORAL | Status: DC | PRN

## 2021-08-11 NOTE — Progress Notes (Signed)
Second CT scan done and the report of the  Stomach and bowel showed:  Stomach/Bowel: Moderate stool seen throughout the colon without evidence of obstruction, similar to prior examination. The stomach, small bowel, and large bowel are otherwise unremarkable. Appendix normal.  The final report impressions were:  IMPRESSION: Stable infiltrative change within the retroperitoneum circumferentially surrounding the abdominal aorta. See differential considerations above.  Stable bilateral adnexal cysts. Given their stability since remote prior examination of 08/04/2018, a benign process is favored.   Moderate colonic stool burden.   Stable perivesicular inflammatory stranding, suggesting a superimposed infectious or inflammatory cystitis. The bladder is decompressed.  It is unlikely that the patient's moderate stool burden is causing any of her problems.  The differential diagnosis reported was:  Differential considerations include inflammatory aortitis retroperitoneal fibrosis, IgG4 related inflammatory disease, or lymphoma. The abdominal vasculature is Unremarkable.  Nothing further to add from a GI point of view.

## 2021-08-11 NOTE — H&P (Signed)
[place]   PATIENT NAME: Kimberly [name]    MR#:  [id number]  DATE OF BIRTH:  June 02, 1961  DATE OF ADMISSION:  08/10/2021  PRIMARY CARE PHYSICIAN: Dion Body, MD   Patient is coming from: Home  REQUESTING/REFERRING PHYSICIAN: Rudene Re, MD  CHIEF COMPLAINT:   Chief Complaint  Patient presents with   Abdominal Pain    HISTORY OF PRESENT ILLNESS:  Kimberly [name] is a 61 y.o. female with medical history significant for asthma, depression, and anxiety, GERD and urolithiasis, who presented to the ER with acute onset of left lower quadrant abdominal pain with associated dysuria, urinary frequency and urgency, without hematuria however with radiation to her left flank that felt this sharp and stabbing pain worsening with deep breathing or movement.  The patient admitted to nausea without vomiting or diarrhea or constipation.  She has been feeling generally weak but denies any myalgia.  No rhinorrhea or nasal congestion or sore throat or earache.  No headache or dizziness or blurred vision.  No chest pain or palpitations.  No cough or wheezing or dyspnea.  ED Course: Upon presentation to the ER, vital signs were within normal.  Labs revealed positive influenza B with negative influenza A and negative COVID-19 PCR.  CMP was remarkable for an albumin of 3.3 and CBC was within normal.  UA showed many bacteria hazy appearance, 0-5 RBCs and 0-5 WBCs with 21-50 squamous cells.  Imaging: Abdominal pelvic CT scan revealed the following: 1. Generalized retroperitoneal stranding and inflammation, most prominent at the level of the lower right kidney to the iliac bifurcation. Source of inflammation is difficult to delineate. Differential considerations include primary retroperitoneal fibrosis, unspecified vasculitis, or reactive inflammation related to the primary bowel process, although no definite focal inflamed bowel process is seen as source. Consider short interval  follow-up CT to assess for change, consider administration of enteric contrast for better bowel assessment. 2. Septated versus 2 adnexal cysts on the left measures 4.9 x 3.2 cm, chronic and not significantly changed from prior 2020 exam allowing for differences in caliper placement, consistent with benign process. 3. Small hiatal hernia. 4. Moderate colonic stool burden with colonic tortuosity, suggesting constipation. 5. Shotty retroperitoneal and central mesenteric adenopathy is likely reactive.  The patient was given 4 mg of IV morphine sulfate and 4 mg of IV Zofran, 1 L bolus of IV lactated Ringer and 3.375 g of IV Zosyn.  She will be admitted to a medical telemetry bed for further evaluation and management. PAST MEDICAL HISTORY:   Past Medical History:  Diagnosis Date   Anxiety    takes alprazolam - rare use    Arthritis    cervical spondylosis    Asthma    Balance problem    Brain cyst    Cervical fusion syndrome    Complete rupture of rotator cuff    Depression    Difficult intubation    Small mouth opening, limited neck flexion, very anterior    Dyspnea    Flu 08/10/2018   tested positive   GERD (gastroesophageal reflux disease)    pt. reports that its better, no meds in use at this time- 2015   Headache    Herpes    History of kidney stones    History of pneumonia    Hypertension    pt. doesn't see cardiologist, followed for HTN by Dr. Gerarda Fraction   Inflammation of shoulder joint    Memory difficulties    Neuromuscular disorder (  [place])    joint and muscle problems   Occipital neuralgia    Pneumothorax on right    following C4-6 ACDF 07/04/16   PONV (postoperative nausea and vomiting)    WITH SURGERY 07/04/16 (NECK) RT LUNG COLLAPSED   Pseudoarthrosis of cervical spine (HCC)    Recurrent falls    Syncope    Urinary frequency     PAST SURGICAL HISTORY:   Past Surgical History:  Procedure Laterality Date   ABDOMINAL HYSTERECTOMY     ANTERIOR CERVICAL  DECOMP/DISCECTOMY FUSION N/A 08/06/2014   Procedure: ANTERIOR CERVICAL DECOMPRESSION/DISCECTOMY FUSION CERVICAL THREE-FOUR,CERVICAL SIX-SEVEN ,CERVICAL SEVEN-THORACIC ONE;  Surgeon: Floyce Stakes, MD;  Location: [place];  Service: Neurosurgery;  Laterality: N/A;   ANTERIOR CERVICAL DECOMP/DISCECTOMY FUSION N/A 07/04/2016   Procedure: CERVICAL FOUR-FIVE, CERVICAL FIVE-SIX ANTERIOR CERVICAL DECOMPRESSION/DISCECTOMY/FUSION WITH REVISION OF CERVICAL THREE-FOUR PLATE;  Surgeon: Leeroy Cha, MD;  Location: [place];  Service: Neurosurgery;  Laterality: N/A;   CARPAL TUNNEL RELEASE Left 02/16/2013   Procedure: CARPAL TUNNEL RELEASE;  Surgeon: Carole Civil, MD;  Location: [facility];  Service: Orthopedics;  Laterality: Left;   CARPAL TUNNEL RELEASE Right 03/27/2013   Procedure: RIGHT CARPAL TUNNEL RELEASE;  Surgeon: Carole Civil, MD;  Location: [facility];  Service: Orthopedics;  Laterality: Right;   CESAREAN SECTION     x2   CHOLECYSTECTOMY N/A 06/17/2015   Procedure: LAPAROSCOPIC CHOLECYSTECTOMY;  Surgeon: Aviva Signs, MD;  Location: [facility];  Service: General;  Laterality: N/A;   COLONOSCOPY     COLONOSCOPY WITH PROPOFOL N/A 05/06/2019   Procedure: COLONOSCOPY WITH PROPOFOL;  Surgeon: Robert Bellow, MD;  Location: [facility];  Service: Endoscopy;  Laterality: N/A;   ESOPHAGOGASTRODUODENOSCOPY N/A 05/08/2021   Procedure: ESOPHAGOGASTRODUODENOSCOPY (EGD);  Surgeon: Lesly Rubenstein, MD;  Location: [facility];  Service: Endoscopy;  Laterality: N/A;   EXTRACORPOREAL SHOCK WAVE LITHOTRIPSY Left 09/04/2018   Procedure: EXTRACORPOREAL SHOCK WAVE LITHOTRIPSY (ESWL);  Surgeon: Billey Co, MD;  Location: [facility];  Service: Urology;  Laterality: Left;   FOREIGN BODY REMOVAL Left    knee-as child   KNEE ARTHROSCOPY     NASAL SINUS SURGERY N/A 04/13/2015   Procedure: nasal endoscopy with adenoid biopsy;  Surgeon: Ruby Cola, MD;  Location: [place];  Service: ENT;  Laterality: N/A;   NECK  SURGERY  2016   x 3 all together   POSTERIOR CERVICAL FUSION/FORAMINOTOMY N/A 11/13/2016   Procedure: CERVICAL TWO-CERVICAL SIX POSTERIOR CERVICAL FUSION WITH LATERAL MASS FIXATION;  Surgeon: Kristeen Miss, MD;  Location: [place];  Service: Neurosurgery;  Laterality: N/A;  posterior approach   REDUCTION MAMMAPLASTY     ROTATOR CUFF REPAIR Left    SHOULDER ACROMIOPLASTY Left 05/18/2015   Procedure: SHOULDER ACROMIOPLASTY;  Surgeon: Earlie Server, MD;  Location: [place];  Service: Orthopedics;  Laterality: Left;   SHOULDER ARTHROSCOPY WITH DISTAL CLAVICLE RESECTION Left 05/18/2015   Procedure: LEFT SHOULDER ARTHROSCOPY WITH  DISTAL CLAVICLE RESECTION;  Surgeon: Earlie Server, MD;  Location: [place];  Service: Orthopedics;  Laterality: Left;    SOCIAL HISTORY:   Social History   Tobacco Use   Smoking status: Never   Smokeless tobacco: Never  Substance Use Topics   Alcohol use: No    FAMILY HISTORY:   Family History  Problem Relation Age of Onset   Hypertension Father    Hypertension Other    Breast cancer Neg Hx     DRUG ALLERGIES:   Allergies  Allergen Reactions  Shellfish Allergy Anaphylaxis and Hives    "Swells throat up"   Peanut-Containing Drug Products Hives   Codeine Other (See Comments)    jitters   Diphtheria Toxoid Rash   Tetanus Toxoids Rash    REVIEW OF SYSTEMS:   ROS As per history of present illness. All pertinent systems were reviewed above. Constitutional, HEENT, cardiovascular, respiratory, GI, GU, musculoskeletal, neuro, psychiatric, endocrine, integumentary and hematologic systems were reviewed and are otherwise negative/unremarkable except for positive findings mentioned above in the HPI.   MEDICATIONS AT HOME:   Prior to Admission medications   Medication Sig Start Date End Date Taking? Authorizing Provider  acetaminophen (TYLENOL) 500 MG tablet Take 500 mg by mouth 2 (two) times daily as needed for headache.     [provider]  acyclovir (ZOVIRAX) 400 MG tablet Take 400 mg by mouth 2 (two) times daily.     [provider]  ALPRAZolam Duanne Moron) 0.5 MG tablet Take 0.5 mg by mouth 3 (three) times daily as needed for anxiety.    [provider]  amLODipine (NORVASC) 5 MG tablet Take 5 mg by mouth daily.     [provider]  Cholecalciferol 25 MCG (1000 UT) tablet Take 1,000 Units by mouth daily.    [provider]  DULoxetine (CYMBALTA) 30 MG capsule Take by mouth. 11/04/18   [provider]  escitalopram (LEXAPRO) 10 MG tablet Take 10 mg by mouth daily as needed (anxiety).     [provider]  estradiol (ESTRACE) 0.5 MG tablet Take 0.5 mg by mouth daily. 01/14/19   [provider]  gabapentin (NEURONTIN) 300 MG capsule Take 1 capsule (300 mg total) by mouth 3 (three) times daily. 06/15/21 12/12/21  Gillis Santa, MD  ibuprofen (ADVIL) 200 MG tablet Take 200 mg by mouth every 6 (six) hours as needed.    [provider]  liothyronine (CYTOMEL) 5 MCG tablet Take 1 tablet by mouth daily. 03/11/18   [provider]  meloxicam (MOBIC) 7.5 MG tablet Take 1 tablet by mouth 2 (two) times daily. 09/18/19   [provider]  methocarbamol (ROBAXIN) 500 MG tablet Take 500 mg by mouth every 6 (six) hours as needed.  01/03/15   [provider]  nortriptyline (PAMELOR) 10 MG capsule Take 1 capsule by mouth at bedtime as needed for sleep.  08/07/16   [provider]  Oxycodone HCl 10 MG TABS Take 1 tablet (10 mg total) by mouth every 8 (eight) hours as needed. Must last 30 days. 07/24/21 08/23/21  Gillis Santa, MD  Oxycodone HCl 10 MG TABS Take 1 tablet (10 mg total) by mouth every 8 (eight) hours as needed. Must last 30 days. 08/23/21 09/22/21  Gillis Santa, MD  pantoprazole (PROTONIX) 40 MG tablet Take 40 mg by mouth daily.    [provider]  rizatriptan (MAXALT) 10 MG tablet Take 10 mg by mouth as needed for  migraine. May repeat in 2 hours if needed    [provider]  tamsulosin (FLOMAX) 0.4 MG CAPS capsule Take 1 capsule (0.4 mg total) by mouth daily. 07/12/21   Stoioff, Ronda Fairly, MD  Tetrahydrozoline HCl (VISINE OP) Apply 1 drop to eye daily as needed (irritation).    [provider]  valACYclovir (VALTREX) 1000 MG tablet Take 1,000 mg by mouth daily. 06/20/21   [provider]  Vibegron (GEMTESA) 75 MG TABS Take 75 mg by mouth daily. 07/12/21   Abbie Sons, MD  VITAL SIGNS:  Blood pressure 117/85, pulse 88, temperature 98.1 F (36.7 C), temperature source Oral, resp. rate 18, height 5\' 1"  (1.549 m), weight 66.2 kg, SpO2 96 %.  PHYSICAL EXAMINATION:  Physical Exam  GENERAL:  61 y.o.-year-old African-American female patient lying in the bed with no acute distress.  EYES: Pupils equal, round, reactive to light and accommodation. No scleral icterus. Extraocular muscles intact.  HEENT: Head atraumatic, normocephalic. Oropharynx and nasopharynx clear.  NECK:  Supple, no jugular venous distention. No thyroid enlargement, no tenderness.  LUNGS: Normal breath sounds bilaterally, no wheezing, rales,rhonchi or crepitation. No use of accessory muscles of respiration.  CARDIOVASCULAR: Regular rate and rhythm, S1, S2 normal. No murmurs, rubs, or gallops.  ABDOMEN: Soft, nondistended with left lower quadrant abdominal tenderness without rebound tenderness guarding or rigidity.  Bowel sounds present. No organomegaly or mass.  EXTREMITIES: No pedal edema, cyanosis, or clubbing.  NEUROLOGIC: Cranial nerves II through XII are intact. Muscle strength 5/5 in all extremities. Sensation intact. Gait not checked.  PSYCHIATRIC: The patient is alert and oriented x 3.  Normal affect and good eye contact. SKIN: No obvious rash, lesion, or ulcer.   LABORATORY PANEL:   CBC Recent Labs  Lab 08/10/21 1741  WBC 10.2  HGB 12.6  HCT 38.2  PLT 303    ------------------------------------------------------------------------------------------------------------------  Chemistries  Recent Labs  Lab 08/10/21 1741  NA 136  K 3.9  CL 102  CO2 26  GLUCOSE 113*  BUN 14  CREATININE 1.05*  CALCIUM 9.1  AST 20  ALT 12  ALKPHOS 58  BILITOT 0.7   ------------------------------------------------------------------------------------------------------------------  Cardiac Enzymes No results for input(s): TROPONINI in the last 168 hours. ------------------------------------------------------------------------------------------------------------------  RADIOLOGY:  CT Abdomen Pelvis W Contrast  Result Date: 08/10/2021 CLINICAL DATA:  Left lower quadrant abdominal pain.  Nausea. EXAM: CT ABDOMEN AND PELVIS WITH CONTRAST TECHNIQUE: Multidetector CT imaging of the abdomen and pelvis was performed using the standard protocol following bolus administration of intravenous contrast. RADIATION DOSE REDUCTION: This exam was performed according to the departmental dose-optimization program which includes automated exposure control, adjustment of the mA and/or kV according to patient size and/or use of iterative reconstruction technique. CONTRAST:  152mL OMNIPAQUE IOHEXOL 300 MG/ML  SOLN COMPARISON:  Noncontrast CT 03/08/2021 FINDINGS: Lower chest: Subsegmental atelectasis in both lower lobes. No pleural effusion. Small hiatal hernia. Hepatobiliary: No focal hepatic abnormality. Cholecystectomy. Proximal common bile duct measures 9 mm, normal for postcholecystectomy state. No visualized choledocholithiasis. Pancreas: There is retroperitoneal stranding but this not appear centered on the pancreas. No evidence of pancreatic inflammation. No pancreatic ductal dilatation or visualized pancreatic mass. Spleen: Normal in size without focal abnormality. Adrenals/Urinary Tract: Normal adrenal glands. No hydronephrosis or perinephric edema. Homogeneous renal enhancement  with symmetric excretion on delayed phase imaging. No visualized renal calculi. No focal renal lesion. Urinary bladder is partially distended without wall thickening. There is mild perivesicular fat stranding. Stomach/Bowel: Small hiatal hernia. Decompressed stomach. Small amount of fluid in the duodenum without definite wall thickening. There is mild retroperitoneal edema, but is not appear centered on the duodenum. Occasional fluid-filled loops of small bowel in the central abdomen. No bowel dilatation or obstruction. Occasional small bowel enhancement in the right lower quadrant bowel loops. No small bowel inflammation. Normal appendix. Moderate colonic stool burden with colonic tortuosity. No significant diverticular disease. Vascular/Lymphatic: There is generalized retroperitoneal stranding most prominent at the level of the lower right kidney to the iliac bifurcation. The abdominal aorta is normal in caliber, mild aortic  tortuosity. There is no definite aortic thickening, although patient motion artifact limits detailed assessment. IVC and iliac veins are patent. Portal and splenic veins are patent. There is shotty retroperitoneal and central mesenteric adenopathy. Reproductive: Hysterectomy. Septated versus 2 adnexal cysts on the left measures 4.9 x 3.2 cm, chronic and not significantly changed from prior exam allowing for differences in caliper placement. There may be mild stranding adjacent to the ovary. 1.8 cm fluid density structure in the right pelvis was not seen on prior exams. Other: Generalized retroperitoneal stranding, with stranding tracking into both pericolic gutters and the pelvis. Source of inflammation is difficult to delineate. Trace free fluid in the pelvis. No free air. Tiny fat containing umbilical hernia Musculoskeletal: There are no acute or suspicious osseous abnormalities. No evidence of iliopsoas or intramuscular collection. IMPRESSION: 1. Generalized retroperitoneal stranding and  inflammation, most prominent at the level of the lower right kidney to the iliac bifurcation. Source of inflammation is difficult to delineate. Differential considerations include primary retroperitoneal fibrosis, unspecified vasculitis, or reactive inflammation related to the primary bowel process, although no definite focal inflamed bowel process is seen as source. Consider short interval follow-up CT to assess for change, consider administration of enteric contrast for better bowel assessment. 2. Septated versus 2 adnexal cysts on the left measures 4.9 x 3.2 cm, chronic and not significantly changed from prior 2020 exam allowing for differences in caliper placement, consistent with benign process. 3. Small hiatal hernia. 4. Moderate colonic stool burden with colonic tortuosity, suggesting constipation. 5. Shotty retroperitoneal and central mesenteric adenopathy is likely reactive. Electronically Signed   By: Keith Rake M.D.   On: 08/10/2021 23:53      IMPRESSION AND PLAN:   1.  Left lower quadrant abdominal pain with abnormal CT findings concerning for pyelonephritis especially given associated dysuria and bacteriuria though she did not have significant pyuria.  Differential diagnosis would include colitis. - The patient will be admitted to a medical telemetry bed. - We will hydrate with IV normal saline. - We will continue antibiotic therapy with IV Rocephin and Flagyl that should cover pyelonephritis and colitis. - We will obtain a follow-up abdominal CT scan with oral contrast within 24 h from first one.  2.  Influenza B. - The patient will be placed on p.o. Tamiflu.  3.  Essential hypertension. - We will continue amlodipine.  4.  Hypothyroidism. - We will continue Cytomel.  5.  Anxiety and depression. - We will continue Xanax and Lexapro.  6.  Peripheral neuropathy. - We will continue Neurontin.  7.  GERD. - We will continue PPI therapy.   DVT prophylaxis: Lovenox. Code  Status: full code. Family Communication:  The plan of care was discussed in details with the patient (and family). I answered all questions. The patient agreed to proceed with the above mentioned plan. Further management will depend upon hospital course. Disposition Plan: Back to previous home environment Consults called: none. All the records are reviewed and case discussed with ED provider.  Status is: Inpatient  At the time of the admission, it appears that the appropriate admission status for this patient is inpatient.  This is judged to be reasonable and necessary in order to provide the required intensity of service to ensure the patient's safety given the presenting symptoms, physical exam findings and initial radiographic and laboratory data in the context of comorbid conditions.  The patient requires inpatient status due to high intensity of service, high risk of further deterioration and high frequency  of surveillance required.  I certify that at the time of admission, it is my clinical judgment that the patient will require inpatient hospital care extending more than 2 midnights.                            Dispo: The patient is from: Home              Anticipated d/c is to: Home              Patient currently is not medically stable to d/c.              Difficult to place patient: No   Christel Mormon M.D on 08/11/2021 at 1:29 AM  [facility]   From 7 PM-7 AM, contact night-coverage [email or web address]  CC: Primary care physician; Dion Body, MD

## 2021-08-11 NOTE — ED Provider Notes (Signed)
Septic care of this patient from Dr. Charna Archer pending CT and urinalysis.  Her CT is concerning for generalized retroperitoneal stranding and inflammation mostly on the right side.  Unclear if this is coming from kidney or bowel at this time.  Radiologist did recommend a close repeat CT with oral contrast and since today is Friday patient will most likely not be able to get the study done in time as an outpatient.  She does report some dysuria over the last week or so I am leaning more towards pyelonephritis at this time.  I will cover her with Zosyn and discussed with the hospitalist service for admission.    I, Rudene Re, attending MD, have personally viewed and interpreted the images obtained during this visit as below:  CT: R retroperitoneal stranding   ___________________________________________________ Interpretation by Radiologist:  CT Abdomen Pelvis W Contrast  Result Date: 08/10/2021 CLINICAL DATA:  Left lower quadrant abdominal pain.  Nausea. EXAM: CT ABDOMEN AND PELVIS WITH CONTRAST TECHNIQUE: Multidetector CT imaging of the abdomen and pelvis was performed using the standard protocol following bolus administration of intravenous contrast. RADIATION DOSE REDUCTION: This exam was performed according to the departmental dose-optimization program which includes automated exposure control, adjustment of the mA and/or kV according to patient size and/or use of iterative reconstruction technique. CONTRAST:  127mL OMNIPAQUE IOHEXOL 300 MG/ML  SOLN COMPARISON:  Noncontrast CT 03/08/2021 FINDINGS: Lower chest: Subsegmental atelectasis in both lower lobes. No pleural effusion. Small hiatal hernia. Hepatobiliary: No focal hepatic abnormality. Cholecystectomy. Proximal common bile duct measures 9 mm, normal for postcholecystectomy state. No visualized choledocholithiasis. Pancreas: There is retroperitoneal stranding but this not appear centered on the pancreas. No evidence of pancreatic  inflammation. No pancreatic ductal dilatation or visualized pancreatic mass. Spleen: Normal in size without focal abnormality. Adrenals/Urinary Tract: Normal adrenal glands. No hydronephrosis or perinephric edema. Homogeneous renal enhancement with symmetric excretion on delayed phase imaging. No visualized renal calculi. No focal renal lesion. Urinary bladder is partially distended without wall thickening. There is mild perivesicular fat stranding. Stomach/Bowel: Small hiatal hernia. Decompressed stomach. Small amount of fluid in the duodenum without definite wall thickening. There is mild retroperitoneal edema, but is not appear centered on the duodenum. Occasional fluid-filled loops of small bowel in the central abdomen. No bowel dilatation or obstruction. Occasional small bowel enhancement in the right lower quadrant bowel loops. No small bowel inflammation. Normal appendix. Moderate colonic stool burden with colonic tortuosity. No significant diverticular disease. Vascular/Lymphatic: There is generalized retroperitoneal stranding most prominent at the level of the lower right kidney to the iliac bifurcation. The abdominal aorta is normal in caliber, mild aortic tortuosity. There is no definite aortic thickening, although patient motion artifact limits detailed assessment. IVC and iliac veins are patent. Portal and splenic veins are patent. There is shotty retroperitoneal and central mesenteric adenopathy. Reproductive: Hysterectomy. Septated versus 2 adnexal cysts on the left measures 4.9 x 3.2 cm, chronic and not significantly changed from prior exam allowing for differences in caliper placement. There may be mild stranding adjacent to the ovary. 1.8 cm fluid density structure in the right pelvis was not seen on prior exams. Other: Generalized retroperitoneal stranding, with stranding tracking into both pericolic gutters and the pelvis. Source of inflammation is difficult to delineate. Trace free fluid in the  pelvis. No free air. Tiny fat containing umbilical hernia Musculoskeletal: There are no acute or suspicious osseous abnormalities. No evidence of iliopsoas or intramuscular collection. IMPRESSION: 1. Generalized retroperitoneal stranding and inflammation, most prominent at  the level of the lower right kidney to the iliac bifurcation. Source of inflammation is difficult to delineate. Differential considerations include primary retroperitoneal fibrosis, unspecified vasculitis, or reactive inflammation related to the primary bowel process, although no definite focal inflamed bowel process is seen as source. Consider short interval follow-up CT to assess for change, consider administration of enteric contrast for better bowel assessment. 2. Septated versus 2 adnexal cysts on the left measures 4.9 x 3.2 cm, chronic and not significantly changed from prior 2020 exam allowing for differences in caliper placement, consistent with benign process. 3. Small hiatal hernia. 4. Moderate colonic stool burden with colonic tortuosity, suggesting constipation. 5. Shotty retroperitoneal and central mesenteric adenopathy is likely reactive. Electronically Signed   By: Keith Rake M.D.   On: 08/10/2021 23:53      Rudene Re, MD 08/11/21 [phone]

## 2021-08-11 NOTE — Progress Notes (Addendum)
PROGRESS NOTE    Kimberly [name]  DJS:[id number] DOB: 04-Aug-1960 DOA: 08/10/2021 PCP: Dion Body, MD   Brief Narrative: 61 year old with past medical history significant for asthma, depression, anxiety, GERD, urolithiasis who presented to the ER with acute onset of left lower quadrant abdominal pain, associated dysuria and increased frequency.  Patient reported nausea without vomiting.  She reported constipation. CT abdomen and pelvis: Showed generalized retroperitoneal stranding and inflammation most prominent at the level of the lower right kidney to the iliac bifurcation.  Source of inflammation is difficult to delineate.  Differential includes primary retroperitoneal fibrosis, unspecified back for colitis, reactive inflammation related to a primary bowel process although not definitive focal inflamed bowel process seen.  2 adnexal cyst on the left measure 4.9 x 3.2 chronic no significant changes from prior exam 2020.    Assessment & Plan:   Principal Problem:   Sepsis (Kimberly [name]) Active Problems:   Left lower quadrant abdominal pain  1-Left lower quadrant abdominal pain: CT abdomen and pelvis: Show generalized retroperitoneal stranding and inflammation most prominent at the level of the lower right kidney to the iliac bifurcation.  Source of inflammation is difficult to delineate.  Differential includes primary retroperitoneal fibrosis, unspecified back for colitis, reactive inflammation related to a primary bowel process although not definitive focal inflamed bowel process seen. Pain could be related to [place] versus primary bowel problem.  UA without pyuria , makes pyelo less likely.  -GI consult.  -Continue with IV ceftriaxone and flagyl.  -Repeat CT scan with oral contrast.  -IV fluids.  -follow urine culture/   2-? Influenza;  Per admitted patient is positive for influenza.  I don't see test results.  Plan to check Respiratory panel.  Hold tamiflu.   3-Hypertension:  Continue with amlodipine  Hypothyroidism: Continue with Liothyronine.   Anxiety depression: Continue with Xanax and Lexapro  Peripheral neuropathy: Continue with Neurontin GERD: Continue with PPI Sepsis ruled out   Estimated body mass index is 27.59 kg/m as calculated from the following:   Height as of this encounter: 5\' 1"  (1.549 m).   Weight as of this encounter: 66.2 kg.   DVT prophylaxis: Lovenox Code Status: Full code Family Communication: care discussed with patient.  Disposition Plan:  Status is: Inpatient  Remains inpatient appropriate because: needs IV fluids and IV antibiotics., for abdominal pain unclear etiology.         Consultants:  GI  Procedures:  NOne  Antimicrobials:  IV ceftriaxone, Flagyl.   Subjective: She report pain when she urinates. She has been having pain in her vagina// when she urinates , her PCP refer her to Urology. She is suppose to have cystoscopy.  She presents because she develops left Lower quadrant pain. Severe. Feels better since she came to ED. She has been constipated.   Objective: Vitals:   08/11/21 0330 08/11/21 0400 08/11/21 0530 08/11/21 0600  BP: 101/75 94/65 101/82 (!) 115/97  Pulse: 94 88 98 (!) 106  Resp: 17 17  17   Temp:    98.9 F (37.2 C)  TempSrc:    Oral  SpO2: 97% 98% 90% 97%  Weight:      Height:        Intake/Output Summary (Last 24 hours) at 08/11/2021 0816 Last data filed at 08/11/2021 0601 Gross per 24 hour  Intake 1150 ml  Output --  Net 1150 ml   Filed Weights   08/10/21 1736  Weight: 66.2 kg    Examination:  General exam: Appears calm and  comfortable  Respiratory system: Clear to auscultation. Respiratory effort normal. Cardiovascular system: S1 & S2 heard, RRR. No JVD, murmurs, rubs, gallops or clicks. No pedal edema. Gastrointestinal system: Abdomen is nondistended, soft and tender left Lower quadrant. . No organomegaly or masses felt. Normal bowel sounds heard. Central nervous  system: Alert and oriented. No focal neurological deficits. Extremities: Symmetric 5 x 5 power.   Data Reviewed: I have personally reviewed following labs and imaging studies  CBC: Recent Labs  Lab 08/10/21 1741 08/11/21 0526  WBC 10.2 12.2*  HGB 12.6 11.7*  HCT 38.2 35.9*  MCV 95.5 96.2  PLT 303 412   Basic Metabolic Panel: Recent Labs  Lab 08/10/21 1741 08/11/21 0526  NA 136 138  K 3.9 3.8  CL 102 101  CO2 26 28  GLUCOSE 113* 106*  BUN 14 10  CREATININE 1.05* 0.93  CALCIUM 9.1 8.8*   GFR: Estimated Creatinine Clearance: 56.1 mL/min (by C-G formula based on SCr of 0.93 mg/dL). Liver Function Tests: Recent Labs  Lab 08/10/21 1741  AST 20  ALT 12  ALKPHOS 58  BILITOT 0.7  PROT 7.2  ALBUMIN 3.3*   Recent Labs  Lab 08/10/21 1741  LIPASE 34   No results for input(s): AMMONIA in the last 168 hours. Coagulation Profile: No results for input(s): INR, PROTIME in the last 168 hours. Cardiac Enzymes: No results for input(s): CKTOTAL, CKMB, CKMBINDEX, TROPONINI in the last 168 hours. BNP (last 3 results) No results for input(s): PROBNP in the last 8760 hours. HbA1C: No results for input(s): HGBA1C in the last 72 hours. CBG: No results for input(s): GLUCAP in the last 168 hours. Lipid Profile: No results for input(s): CHOL, HDL, LDLCALC, TRIG, CHOLHDL, LDLDIRECT in the last 72 hours. Thyroid Function Tests: No results for input(s): TSH, T4TOTAL, FREET4, T3FREE, THYROIDAB in the last 72 hours. Anemia Panel: No results for input(s): VITAMINB12, FOLATE, FERRITIN, TIBC, IRON, RETICCTPCT in the last 72 hours. Sepsis Labs: No results for input(s): PROCALCITON, LATICACIDVEN in the last 168 hours.  No results found for this or any previous visit (from the past 240 hour(s)).       Radiology Studies: CT Abdomen Pelvis W Contrast  Result Date: 08/10/2021 CLINICAL DATA:  Left lower quadrant abdominal pain.  Nausea. EXAM: CT ABDOMEN AND PELVIS WITH CONTRAST  TECHNIQUE: Multidetector CT imaging of the abdomen and pelvis was performed using the standard protocol following bolus administration of intravenous contrast. RADIATION DOSE REDUCTION: This exam was performed according to the departmental dose-optimization program which includes automated exposure control, adjustment of the mA and/or kV according to patient size and/or use of iterative reconstruction technique. CONTRAST:  169mL OMNIPAQUE IOHEXOL 300 MG/ML  SOLN COMPARISON:  Noncontrast CT 03/08/2021 FINDINGS: Lower chest: Subsegmental atelectasis in both lower lobes. No pleural effusion. Small hiatal hernia. Hepatobiliary: No focal hepatic abnormality. Cholecystectomy. Proximal common bile duct measures 9 mm, normal for postcholecystectomy state. No visualized choledocholithiasis. Pancreas: There is retroperitoneal stranding but this not appear centered on the pancreas. No evidence of pancreatic inflammation. No pancreatic ductal dilatation or visualized pancreatic mass. Spleen: Normal in size without focal abnormality. Adrenals/Urinary Tract: Normal adrenal glands. No hydronephrosis or perinephric edema. Homogeneous renal enhancement with symmetric excretion on delayed phase imaging. No visualized renal calculi. No focal renal lesion. Urinary bladder is partially distended without wall thickening. There is mild perivesicular fat stranding. Stomach/Bowel: Small hiatal hernia. Decompressed stomach. Small amount of fluid in the duodenum without definite wall thickening. There is mild retroperitoneal edema, but  is not appear centered on the duodenum. Occasional fluid-filled loops of small bowel in the central abdomen. No bowel dilatation or obstruction. Occasional small bowel enhancement in the right lower quadrant bowel loops. No small bowel inflammation. Normal appendix. Moderate colonic stool burden with colonic tortuosity. No significant diverticular disease. Vascular/Lymphatic: There is generalized  retroperitoneal stranding most prominent at the level of the lower right kidney to the iliac bifurcation. The abdominal aorta is normal in caliber, mild aortic tortuosity. There is no definite aortic thickening, although patient motion artifact limits detailed assessment. IVC and iliac veins are patent. Portal and splenic veins are patent. There is shotty retroperitoneal and central mesenteric adenopathy. Reproductive: Hysterectomy. Septated versus 2 adnexal cysts on the left measures 4.9 x 3.2 cm, chronic and not significantly changed from prior exam allowing for differences in caliper placement. There may be mild stranding adjacent to the ovary. 1.8 cm fluid density structure in the right pelvis was not seen on prior exams. Other: Generalized retroperitoneal stranding, with stranding tracking into both pericolic gutters and the pelvis. Source of inflammation is difficult to delineate. Trace free fluid in the pelvis. No free air. Tiny fat containing umbilical hernia Musculoskeletal: There are no acute or suspicious osseous abnormalities. No evidence of iliopsoas or intramuscular collection. IMPRESSION: 1. Generalized retroperitoneal stranding and inflammation, most prominent at the level of the lower right kidney to the iliac bifurcation. Source of inflammation is difficult to delineate. Differential considerations include primary retroperitoneal fibrosis, unspecified vasculitis, or reactive inflammation related to the primary bowel process, although no definite focal inflamed bowel process is seen as source. Consider short interval follow-up CT to assess for change, consider administration of enteric contrast for better bowel assessment. 2. Septated versus 2 adnexal cysts on the left measures 4.9 x 3.2 cm, chronic and not significantly changed from prior 2020 exam allowing for differences in caliper placement, consistent with benign process. 3. Small hiatal hernia. 4. Moderate colonic stool burden with colonic  tortuosity, suggesting constipation. 5. Shotty retroperitoneal and central mesenteric adenopathy is likely reactive. Electronically Signed   By: Keith Rake M.D.   On: 08/10/2021 23:53        Scheduled Meds:  amLODipine  5 mg Oral Daily   cholecalciferol  1,000 Units Oral Daily   enoxaparin (LOVENOX) injection  40 mg Subcutaneous Q24H   estradiol  1.5 mg Oral Daily   gabapentin  300 mg Oral TID   liothyronine  5 mcg Oral Daily   oseltamivir  75 mg Oral BID   pantoprazole  40 mg Oral Daily   valACYclovir  1,000 mg Oral Daily   Vibegron  75 mg Oral Daily   Continuous Infusions:  sodium chloride 100 mL/hr at 08/11/21 0518   cefTRIAXone (ROCEPHIN)  IV Stopped (08/11/21 0601)   metronidazole 500 mg (08/11/21 0518)     LOS: 0 days    Time spent: 35 minutes    Amarilys Lyles A Dontea Corlew, MD [facility]   If 7PM-7AM, please contact night-coverage [email or web address]  08/11/2021, 8:16 AM

## 2021-08-11 NOTE — Consult Note (Signed)
Lucilla Lame, MD [facility]  [address] [place], [place] [address] Phone: [phone] Fax : [phone]  Consultation  Referring Provider:     Dr. Tyrell Antonio Primary Care Physician:  Dion Body, MD Primary Gastroenterologist:  Dr. Haig Prophet         Reason for Consultation:     Abdominal pain  Date of Admission:  08/10/2021 Date of Consultation:  08/11/2021         HPI:   DELTHA BERNALES is a 61 y.o. female who comes in today with a report of 3 days abdominal pain.  The patient states that her abdominal pain is in her left side and radiates from front to back.  The patient also reports that she has not been eating as well as she usually does and has been constipated only over the last 3 days.  She had a urinalysis that showed haziness with white cell counts and multiple bacteria.  The patient also had a CT scan that reported:  IMPRESSION: 1. Generalized retroperitoneal stranding and inflammation, most prominent at the level of the lower right kidney to the iliac bifurcation. Source of inflammation is difficult to delineate. Differential considerations include primary retroperitoneal fibrosis, unspecified vasculitis, or reactive inflammation related to the primary bowel process, although no definite focal inflamed bowel process is seen as source. Consider short interval follow-up CT to assess for change, consider administration of enteric contrast for better bowel assessment. 2. Septated versus 2 adnexal cysts on the left measures 4.9 x 3.2 cm, chronic and not significantly changed from prior 2020 exam allowing for differences in caliper placement, consistent with benign process. 3. Small hiatal hernia. 4. Moderate colonic stool burden with colonic tortuosity, suggesting constipation. 5. Shotty retroperitoneal and central mesenteric adenopathy is likely reactive.  The patient reports that her abdominal pain is much better.  She was having nausea without any  vomiting.  She denies any black stools or bloody stools.  The patient had a upper endoscopy by Dr. Haig Prophet in December 2022 that showed:  - No endoscopic esophageal abnormality to explain patient's dysphagia. Biopsied. - Widely patent Schatzki ring. - 3 cm hiatal hernia. - Normal stomach. - Normal examined duodenum.  The patient had a colonoscopy by Dr. Bary Castilla and October 2020 with a 10 mm polyp that was removed and pathology showed it to be normal colonic mucosa.   Past Medical History:  Diagnosis Date   Anxiety    takes alprazolam - rare use    Arthritis    cervical spondylosis    Asthma    Balance problem    Brain cyst    Cervical fusion syndrome    Complete rupture of rotator cuff    Depression    Difficult intubation    Small mouth opening, limited neck flexion, very anterior    Dyspnea    Flu 08/10/2018   tested positive   GERD (gastroesophageal reflux disease)    pt. reports that its better, no meds in use at this time- 2015   Headache    Herpes    History of kidney stones    History of pneumonia    Hypertension    pt. doesn't see cardiologist, followed for HTN by Dr. Gerarda Fraction   Inflammation of shoulder joint    Memory difficulties    Neuromuscular disorder ([place])    joint and muscle problems   Occipital neuralgia    Pneumothorax on right    following C4-6 ACDF 07/04/16   PONV (postoperative nausea  and vomiting)    WITH SURGERY 07/04/16 (NECK) RT LUNG COLLAPSED   Pseudoarthrosis of cervical spine (HCC)    Recurrent falls    Syncope    Urinary frequency     Past Surgical History:  Procedure Laterality Date   ABDOMINAL HYSTERECTOMY     ANTERIOR CERVICAL DECOMP/DISCECTOMY FUSION N/A 08/06/2014   Procedure: ANTERIOR CERVICAL DECOMPRESSION/DISCECTOMY FUSION CERVICAL THREE-FOUR,CERVICAL SIX-SEVEN ,CERVICAL SEVEN-THORACIC ONE;  Surgeon: Floyce Stakes, MD;  Location: [place];  Service: Neurosurgery;  Laterality: N/A;   ANTERIOR CERVICAL DECOMP/DISCECTOMY FUSION N/A  07/04/2016   Procedure: CERVICAL FOUR-FIVE, CERVICAL FIVE-SIX ANTERIOR CERVICAL DECOMPRESSION/DISCECTOMY/FUSION WITH REVISION OF CERVICAL THREE-FOUR PLATE;  Surgeon: Leeroy Cha, MD;  Location: [place];  Service: Neurosurgery;  Laterality: N/A;   CARPAL TUNNEL RELEASE Left 02/16/2013   Procedure: CARPAL TUNNEL RELEASE;  Surgeon: Carole Civil, MD;  Location: [facility];  Service: Orthopedics;  Laterality: Left;   CARPAL TUNNEL RELEASE Right 03/27/2013   Procedure: RIGHT CARPAL TUNNEL RELEASE;  Surgeon: Carole Civil, MD;  Location: [facility];  Service: Orthopedics;  Laterality: Right;   CESAREAN SECTION     x2   CHOLECYSTECTOMY N/A 06/17/2015   Procedure: LAPAROSCOPIC CHOLECYSTECTOMY;  Surgeon: Aviva Signs, MD;  Location: [facility];  Service: General;  Laterality: N/A;   COLONOSCOPY     COLONOSCOPY WITH PROPOFOL N/A 05/06/2019   Procedure: COLONOSCOPY WITH PROPOFOL;  Surgeon: Robert Bellow, MD;  Location: [facility];  Service: Endoscopy;  Laterality: N/A;   ESOPHAGOGASTRODUODENOSCOPY N/A 05/08/2021   Procedure: ESOPHAGOGASTRODUODENOSCOPY (EGD);  Surgeon: Lesly Rubenstein, MD;  Location: [facility];  Service: Endoscopy;  Laterality: N/A;   EXTRACORPOREAL SHOCK WAVE LITHOTRIPSY Left 09/04/2018   Procedure: EXTRACORPOREAL SHOCK WAVE LITHOTRIPSY (ESWL);  Surgeon: Billey Co, MD;  Location: [facility];  Service: Urology;  Laterality: Left;   FOREIGN BODY REMOVAL Left    knee-as child   KNEE ARTHROSCOPY     NASAL SINUS SURGERY N/A 04/13/2015   Procedure: nasal endoscopy with adenoid biopsy;  Surgeon: Ruby Cola, MD;  Location: [place];  Service: ENT;  Laterality: N/A;   NECK SURGERY  2016   x 3 all together   POSTERIOR CERVICAL FUSION/FORAMINOTOMY N/A 11/13/2016   Procedure: CERVICAL TWO-CERVICAL SIX POSTERIOR CERVICAL FUSION WITH LATERAL MASS FIXATION;  Surgeon: Kristeen Miss, MD;  Location: [place];  Service: Neurosurgery;  Laterality: N/A;  posterior approach   REDUCTION  MAMMAPLASTY     ROTATOR CUFF REPAIR Left    SHOULDER ACROMIOPLASTY Left 05/18/2015   Procedure: SHOULDER ACROMIOPLASTY;  Surgeon: Earlie Server, MD;  Location: [place];  Service: Orthopedics;  Laterality: Left;   SHOULDER ARTHROSCOPY WITH DISTAL CLAVICLE RESECTION Left 05/18/2015   Procedure: LEFT SHOULDER ARTHROSCOPY WITH  DISTAL CLAVICLE RESECTION;  Surgeon: Earlie Server, MD;  Location: [place];  Service: Orthopedics;  Laterality: Left;    Prior to Admission medications   Medication Sig Start Date End Date Taking? Authorizing Provider  acetaminophen (TYLENOL) 500 MG tablet Take 500 mg by mouth 2 (two) times daily as needed for headache.   Yes [provider]  amLODipine (NORVASC) 5 MG tablet Take 5 mg by mouth daily.    Yes [provider]  Cholecalciferol 75 MCG (3000 UT) TABS Take 3,000 Units by mouth daily.   Yes [provider]  escitalopram (LEXAPRO) 10 MG tablet Take 10 mg by mouth daily as needed (anxiety).    Yes [provider]  estradiol (ESTRACE) 1 MG tablet Take 1.5  mg by mouth daily.   Yes [provider]  gabapentin (NEURONTIN) 300 MG capsule Take 1 capsule (300 mg total) by mouth 3 (three) times daily. 06/15/21 12/12/21 Yes Gillis Santa, MD  liothyronine (CYTOMEL) 5 MCG tablet Take 5 mcg by mouth daily.   Yes [provider]  nortriptyline (PAMELOR) 10 MG capsule Take 10 mg by mouth at bedtime as needed for sleep.   Yes [provider]  Oxycodone HCl 10 MG TABS Take 1 tablet (10 mg total) by mouth every 8 (eight) hours as needed. Must last 30 days. 07/24/21 08/23/21 Yes Gillis Santa, MD  pantoprazole (PROTONIX) 40 MG tablet Take 40 mg by mouth daily.   Yes [provider]  rizatriptan (MAXALT) 10 MG tablet Take 10 mg by mouth as needed for migraine. May repeat in 2 hours if needed   Yes [provider]  Tetrahydrozoline HCl (VISINE OP) Apply 1 drop to eye daily  as needed (irritation).   Yes [provider]  valACYclovir (VALTREX) 1000 MG tablet Take 1,000 mg by mouth daily. 06/20/21  Yes [provider]  Vibegron (GEMTESA) 75 MG TABS Take 75 mg by mouth daily. 07/12/21  Yes Stoioff, Ronda Fairly, MD  Oxycodone HCl 10 MG TABS Take 1 tablet (10 mg total) by mouth every 8 (eight) hours as needed. Must last 30 days. 08/23/21 09/22/21  Gillis Santa, MD    Family History  Problem Relation Age of Onset   Hypertension Father    Hypertension Other    Breast cancer Neg Hx      Social History   Tobacco Use   Smoking status: Never   Smokeless tobacco: Never  Vaping Use   Vaping Use: Never used  Substance Use Topics   Alcohol use: No   Drug use: No    Allergies as of 08/10/2021 - Review Complete 08/10/2021  Allergen Reaction Noted   Shellfish allergy Anaphylaxis and Hives 03/22/2011   Peanut-containing drug products Hives 03/22/2011   Codeine Other (See Comments)    Diphtheria toxoid Rash 08/06/2018   Tetanus toxoids Rash 08/07/2018    Review of Systems:    All systems reviewed and negative except where noted in HPI.   Physical Exam:  Vital signs in last 24 hours: Temp:  [98.1 F (36.7 C)-98.9 F (37.2 C)] 98.9 F (37.2 C) (01/13 0600) Pulse Rate:  [82-107] 88 (01/13 1000) Resp:  [14-18] 16 (01/13 1000) BP: (94-117)/(65-97) 101/79 (01/13 1000) SpO2:  [90 %-98 %] 97 % (01/13 1000) Weight:  [66.2 kg] 66.2 kg (01/12 1736)   General:   Pleasant, cooperative in NAD Head:  Normocephalic and atraumatic. Eyes:   No icterus.   Conjunctiva pink. PERRLA. Ears:  Normal auditory acuity. Neck:  Supple; no masses or thyroidomegaly Lungs: Respirations even and unlabored. Lungs clear to auscultation bilaterally.   No wheezes, crackles, or rhonchi.  Heart:  Regular rate and rhythm;  Without murmur, clicks, rubs or gallops Abdomen:  Soft, nondistended, nontender. Normal bowel sounds. No appreciable masses or hepatomegaly.  No rebound or  guarding.  Rectal:  Not performed. Msk:  Symmetrical without gross deformities.    Extremities:  Without edema, cyanosis or clubbing. Neurologic:  Alert and oriented x3;  grossly normal neurologically. Skin:  Intact without significant lesions or rashes. Cervical Nodes:  No significant cervical adenopathy. Psych:  Alert and cooperative. Normal affect.  LAB RESULTS: Recent Labs    08/10/21 1741 08/11/21 0526  WBC 10.2 12.2*  HGB 12.6 11.7*  HCT  38.2 35.9*  PLT 303 272   BMET Recent Labs    08/10/21 1741 08/11/21 0526  NA 136 138  K 3.9 3.8  CL 102 101  CO2 26 28  GLUCOSE 113* 106*  BUN 14 10  CREATININE 1.05* 0.93  CALCIUM 9.1 8.8*   LFT Recent Labs    08/10/21 1741  PROT 7.2  ALBUMIN 3.3*  AST 20  ALT 12  ALKPHOS 58  BILITOT 0.7   PT/INR No results for input(s): LABPROT, INR in the last 72 hours.  STUDIES: CT Abdomen Pelvis W Contrast  Result Date: 08/10/2021 CLINICAL DATA:  Left lower quadrant abdominal pain.  Nausea. EXAM: CT ABDOMEN AND PELVIS WITH CONTRAST TECHNIQUE: Multidetector CT imaging of the abdomen and pelvis was performed using the standard protocol following bolus administration of intravenous contrast. RADIATION DOSE REDUCTION: This exam was performed according to the departmental dose-optimization program which includes automated exposure control, adjustment of the mA and/or kV according to patient size and/or use of iterative reconstruction technique. CONTRAST:  11mL OMNIPAQUE IOHEXOL 300 MG/ML  SOLN COMPARISON:  Noncontrast CT 03/08/2021 FINDINGS: Lower chest: Subsegmental atelectasis in both lower lobes. No pleural effusion. Small hiatal hernia. Hepatobiliary: No focal hepatic abnormality. Cholecystectomy. Proximal common bile duct measures 9 mm, normal for postcholecystectomy state. No visualized choledocholithiasis. Pancreas: There is retroperitoneal stranding but this not appear centered on the pancreas. No evidence of pancreatic inflammation.  No pancreatic ductal dilatation or visualized pancreatic mass. Spleen: Normal in size without focal abnormality. Adrenals/Urinary Tract: Normal adrenal glands. No hydronephrosis or perinephric edema. Homogeneous renal enhancement with symmetric excretion on delayed phase imaging. No visualized renal calculi. No focal renal lesion. Urinary bladder is partially distended without wall thickening. There is mild perivesicular fat stranding. Stomach/Bowel: Small hiatal hernia. Decompressed stomach. Small amount of fluid in the duodenum without definite wall thickening. There is mild retroperitoneal edema, but is not appear centered on the duodenum. Occasional fluid-filled loops of small bowel in the central abdomen. No bowel dilatation or obstruction. Occasional small bowel enhancement in the right lower quadrant bowel loops. No small bowel inflammation. Normal appendix. Moderate colonic stool burden with colonic tortuosity. No significant diverticular disease. Vascular/Lymphatic: There is generalized retroperitoneal stranding most prominent at the level of the lower right kidney to the iliac bifurcation. The abdominal aorta is normal in caliber, mild aortic tortuosity. There is no definite aortic thickening, although patient motion artifact limits detailed assessment. IVC and iliac veins are patent. Portal and splenic veins are patent. There is shotty retroperitoneal and central mesenteric adenopathy. Reproductive: Hysterectomy. Septated versus 2 adnexal cysts on the left measures 4.9 x 3.2 cm, chronic and not significantly changed from prior exam allowing for differences in caliper placement. There may be mild stranding adjacent to the ovary. 1.8 cm fluid density structure in the right pelvis was not seen on prior exams. Other: Generalized retroperitoneal stranding, with stranding tracking into both pericolic gutters and the pelvis. Source of inflammation is difficult to delineate. Trace free fluid in the pelvis. No  free air. Tiny fat containing umbilical hernia Musculoskeletal: There are no acute or suspicious osseous abnormalities. No evidence of iliopsoas or intramuscular collection. IMPRESSION: 1. Generalized retroperitoneal stranding and inflammation, most prominent at the level of the lower right kidney to the iliac bifurcation. Source of inflammation is difficult to delineate. Differential considerations include primary retroperitoneal fibrosis, unspecified vasculitis, or reactive inflammation related to the primary bowel process, although no definite focal inflamed bowel process is seen as source. Consider short interval  follow-up CT to assess for change, consider administration of enteric contrast for better bowel assessment. 2. Septated versus 2 adnexal cysts on the left measures 4.9 x 3.2 cm, chronic and not significantly changed from prior 2020 exam allowing for differences in caliper placement, consistent with benign process. 3. Small hiatal hernia. 4. Moderate colonic stool burden with colonic tortuosity, suggesting constipation. 5. Shotty retroperitoneal and central mesenteric adenopathy is likely reactive. Electronically Signed   By: Keith Rake M.D.   On: 08/10/2021 23:53      Impression / Plan:   Assessment: Principal Problem:   Sepsis ([place]) Active Problems:   Left lower quadrant abdominal pain   MALGORZATA ALBERT is a 61 y.o. y/o female with nausea that is improved and abdominal pain which is not reproducible at this time.  She states that she is feeling much better.  The CT scan had multiple findings with the comment of "no definite focal inflamed bowel process is seen as source".  The patient has relatively few GI symptoms to explain this being a GI primary source of her issues.  Her constipation is likely due to her decreased p.o. intake and her nausea is likely due to her primary process going on which may be related to her kidneys.  There are no retroperitoneal organs that appear inflamed  on the CT scan.   Plan:  The patient does not need any endoscopic intervention at this time and I will start the patient on MiraLAX due to her constipation.  She is feeling much better when I saw her than she did on admission and was nontender to deep palpation anywhere in her abdomen.  Nothing further to add from a GI point of view since I do not believe that her primary issues are GI related.  Thank you for involving me in the care of this patient.      LOS: 0 days   Lucilla Lame, MD, [facility] 08/11/2021, 10:35 AM,  Pager [phone] 7am-5pm  Check AMION for 5pm -7am coverage and on weekends   Note: This dictation was prepared with Dragon dictation along with smaller phrase technology. Any transcriptional errors that result from this process are unintentional.

## 2021-08-12 DIAGNOSIS — R1032 Left lower quadrant pain: Secondary | ICD-10-CM | POA: Diagnosis not present

## 2021-08-12 LAB — BASIC METABOLIC PANEL
Anion gap: 10 (ref 5–15)
BUN: <5 mg/dL — ABNORMAL LOW (ref 6–20)
CO2: 28 mmol/L (ref 22–32)
Calcium: 8.3 mg/dL — ABNORMAL LOW (ref 8.9–10.3)
Chloride: 99 mmol/L (ref 98–111)
Creatinine, Ser: 0.69 mg/dL (ref 0.44–1.00)
GFR, Estimated: >60 mL/min (ref >60)
Glucose, Bld: 88 mg/dL (ref 70–99)
Potassium: 3.5 mmol/L (ref 3.5–5.1)
Sodium: 137 mmol/L (ref 135–145)

## 2021-08-12 LAB — SEDIMENTATION RATE: Sed Rate: 35 mm/hr — ABNORMAL HIGH (ref 0–30)

## 2021-08-12 LAB — CBC
HCT: 35.5 % — ABNORMAL LOW (ref 36.0–46.0)
Hemoglobin: 11.8 g/dL — ABNORMAL LOW (ref 12.0–15.0)
MCH: 31.1 pg (ref 26.0–34.0)
MCHC: 33.2 g/dL (ref 30.0–36.0)
MCV: 93.7 fL (ref 80.0–100.0)
Platelets: 249 10*3/uL (ref 150–400)
RBC: 3.79 MIL/uL — ABNORMAL LOW (ref 3.87–5.11)
RDW: 12.4 % (ref 11.5–15.5)
WBC: 6.8 10*3/uL (ref 4.0–10.5)
nRBC: 0 % (ref 0.0–0.2)

## 2021-08-12 MED ORDER — SENNOSIDES-DOCUSATE SODIUM 8.6-50 MG PO TABS
1.0000 | ORAL_TABLET | Freq: Two times a day (BID) | ORAL | Status: DC
  Administered 2021-08-12 – 2021-08-13 (×2): 1 via ORAL
  Filled 2021-08-12 (×2): qty 1

## 2021-08-12 NOTE — Plan of Care (Signed)
Patient alert, oriented. Independent with ADLs, medicated for pain with PRN medication, see MAR for details. Remains on IV fluids, diet advanced to Regular diet today, tolerated diet without incident.  Remains free from falls/injury. Call bell within reach and bed in lowest position.   Problem: Education: Goal: Knowledge of General Education information will improve Description: Including pain rating scale, medication(s)/side effects and non-pharmacologic comfort measures 08/12/2021 1848 by Pam Drown, RN Outcome: Progressing 08/12/2021 1847 by Pam Drown, RN Outcome: Progressing   Problem: Health Behavior/Discharge Planning: Goal: Ability to manage health-related needs will improve 08/12/2021 1848 by Pam Drown, RN Outcome: Progressing 08/12/2021 1847 by Pam Drown, RN Outcome: Progressing   Problem: Clinical Measurements: Goal: Ability to maintain clinical measurements within normal limits will improve 08/12/2021 1848 by Pam Drown, RN Outcome: Progressing 08/12/2021 1847 by Pam Drown, RN Outcome: Progressing   Problem: Activity: Goal: Risk for activity intolerance will decrease 08/12/2021 1848 by Pam Drown, RN Outcome: Progressing 08/12/2021 1847 by Pam Drown, RN Outcome: Progressing   Problem: Nutrition: Goal: Adequate nutrition will be maintained 08/12/2021 1848 by Pam Drown, RN Outcome: Progressing 08/12/2021 1847 by Pam Drown, RN Outcome: Progressing   Problem: Elimination: Goal: Will not experience complications related to bowel motility 08/12/2021 1848 by Pam Drown, RN Outcome: Progressing 08/12/2021 1847 by Pam Drown, RN Outcome: Progressing Goal: Will not experience complications related to urinary retention 08/12/2021 1848 by Pam Drown, RN Outcome: Progressing 08/12/2021 1847 by Pam Drown, RN Outcome: Progressing   Problem: Pain Managment: Goal: General experience of comfort will improve Outcome: Progressing

## 2021-08-12 NOTE — Progress Notes (Signed)
Kimberly Lame, MD [facility]   [address], [place], [place] [address] Phone: [phone] Fax : [phone]   Subjective: The patient's white cell count is down today and please see my note from last night delineating the repeat CT scan findings. Despite some question about whether the patient had influenza infection it appears that the patient is influenza negative. Labs do show that she was influenza be +3 years ago. The patient reports that her abdominal pain is much better today and has no new complaints.   Objective: Vital signs in last 24 hours: Vitals:   08/11/21 2038 08/11/21 2355 08/12/21 0439 08/12/21 0752  BP: 122/87 105/77 120/88 129/84  Pulse: 85 90 85 87  Resp: 16 16 16 17   Temp: 98.2 F (36.8 C) 97.9 F (36.6 C) 97.6 F (36.4 C) 98.1 F (36.7 C)  TempSrc: Oral Oral    SpO2: 100% 96% 98% 98%  Weight:      Height:       Weight change:   Intake/Output Summary (Last 24 hours) at 08/12/2021 5009 Last data filed at 08/11/2021 1823 Gross per 24 hour  Intake 200 ml  Output --  Net 200 ml     Exam: Heart:: Regular rate and rhythm, S1S2 present, or without murmur or extra heart sounds Lungs: normal and clear to auscultation and percussion Abdomen: soft, nontender, normal bowel sounds   Lab Results: @LABTEST2 @ Micro Results: Recent Results (from the past 240 hour(s))  Resp Panel by RT-PCR (Flu A&B, Covid) Nasopharyngeal Swab     Status: None   Collection Time: 08/11/21  3:40 PM   Specimen: Nasopharyngeal Swab; Nasopharyngeal(NP) swabs in vial transport medium  Result Value Ref Range Status   SARS Coronavirus 2 by RT PCR NEGATIVE NEGATIVE Final    Comment: (NOTE) SARS-CoV-2 target nucleic acids are NOT DETECTED.  The SARS-CoV-2 RNA is generally detectable in upper respiratory specimens during the acute phase of infection. The lowest concentration of SARS-CoV-2 viral copies this assay can detect is 138 copies/mL. A negative result does not preclude  SARS-Cov-2 infection and should not be used as the sole basis for treatment or other patient management decisions. A negative result may occur with  improper specimen collection/handling, submission of specimen other than nasopharyngeal swab, presence of viral mutation(s) within the areas targeted by this assay, and inadequate number of viral copies(<138 copies/mL). A negative result must be combined with clinical observations, patient history, and epidemiological information. The expected result is Negative.  Fact Sheet for Patients:  EntrepreneurPulse.com.au  Fact Sheet for Healthcare Providers:  IncredibleEmployment.be  This test is no t yet approved or cleared by the [place] FDA and  has been authorized for detection and/or diagnosis of SARS-CoV-2 by FDA under an Emergency Use Authorization (EUA). This EUA will remain  in effect (meaning this test can be used) for the duration of the COVID-19 declaration under Section 564(b)(1) of the Act, 21 U.S.C.section 360bbb-3(b)(1), unless the authorization is terminated  or revoked sooner.       Influenza A by PCR NEGATIVE NEGATIVE Final   Influenza B by PCR NEGATIVE NEGATIVE Final    Comment: (NOTE) The Xpert Xpress SARS-CoV-2/FLU/RSV plus assay is intended as an aid in the diagnosis of influenza from Nasopharyngeal swab specimens and should not be used as a sole basis for treatment. Nasal washings and aspirates are unacceptable for Xpert Xpress SARS-CoV-2/FLU/RSV testing.  Fact Sheet for Patients: EntrepreneurPulse.com.au  Fact Sheet for Healthcare Providers: IncredibleEmployment.be  This test is not yet  approved or cleared by the [place] and has been authorized for detection and/or diagnosis of SARS-CoV-2 by FDA under an Emergency Use Authorization (EUA). This EUA will remain in effect (meaning this test can be used) for the duration of  the COVID-19 declaration under Section 564(b)(1) of the Act, 21 U.S.C. section 360bbb-3(b)(1), unless the authorization is terminated or revoked.  Performed at [facility], [address] [place], [place] [address]    Studies/Results: CT ABDOMEN PELVIS W CONTRAST  Result Date: 08/11/2021 CLINICAL DATA:  Pyelonephritis, uncomplicated.  Left abdominal pain. EXAM: CT ABDOMEN AND PELVIS WITH CONTRAST TECHNIQUE: Multidetector CT imaging of the abdomen and pelvis was performed using the standard protocol following bolus administration of intravenous contrast. RADIATION DOSE REDUCTION: This exam was performed according to the departmental dose-optimization program which includes automated exposure control, adjustment of the mA and/or kV according to patient size and/or use of iterative reconstruction technique. CONTRAST:  165mL OMNIPAQUE IOHEXOL 300 MG/ML  SOLN COMPARISON:  08/10/2021 FINDINGS: Lower chest: Mild bibasilar atelectasis. Cardiac size within normal limits. Small hiatal hernia. Hepatobiliary: No focal liver abnormality is seen. Status post cholecystectomy. Stable mild extrahepatic biliary ductal dilation with the extrahepatic bile duct measuring up to 10 mm within the pancreatic head, possibly representing post cholecystectomy change. No intrahepatic biliary ductal dilation. Pancreas: Unremarkable Spleen: Unremarkable Adrenals/Urinary Tract: The adrenal glands are unremarkable. The kidneys are normal. Bladder is completely decompressed. Mild perivesicular inflammatory stranding persists possibly reflecting a superimposed infectious or inflammatory cystitis. Stomach/Bowel: Moderate stool seen throughout the colon without evidence of obstruction, similar to prior examination. The stomach, small bowel, and large bowel are otherwise unremarkable. Appendix normal. Vascular/Lymphatic: Shotty retroperitoneal adenopathy is again identified within the left periaortic and aortocaval lymph node  groups without pathologic enlargement. Perinodal infiltration is again identified. The findings appears stable since immediate prior examination but, as noted previously, are new since prior examination of 03/08/2021. Differential considerations include inflammatory aortitis retroperitoneal fibrosis, IgG4 related inflammatory disease, or lymphoma. The abdominal vasculature is unremarkable. Specifically, no aortic aneurysm is identified. Reproductive: Status post hysterectomy. Bilateral adnexal cysts are unchanged since remote prior examination of 08/04/2018. I favor the cystic lesions within the left ovary to represent 2 separate cysts with the largest measuring 2.9 x 3.6 cm. Other: No abdominal wall hernia.  Rectum unremarkable. Musculoskeletal: No acute bone abnormality. IMPRESSION: Stable infiltrative change within the retroperitoneum circumferentially surrounding the abdominal aorta. See differential considerations above. Stable bilateral adnexal cysts. Given their stability since remote prior examination of 08/04/2018, a benign process is favored. Moderate colonic stool burden. Stable perivesicular inflammatory stranding, suggesting a superimposed infectious or inflammatory cystitis. The bladder is decompressed. Electronically Signed   By: Fidela Salisbury M.D.   On: 08/11/2021 19:30   CT Abdomen Pelvis W Contrast  Result Date: 08/10/2021 CLINICAL DATA:  Left lower quadrant abdominal pain.  Nausea. EXAM: CT ABDOMEN AND PELVIS WITH CONTRAST TECHNIQUE: Multidetector CT imaging of the abdomen and pelvis was performed using the standard protocol following bolus administration of intravenous contrast. RADIATION DOSE REDUCTION: This exam was performed according to the departmental dose-optimization program which includes automated exposure control, adjustment of the mA and/or kV according to patient size and/or use of iterative reconstruction technique. CONTRAST:  134mL OMNIPAQUE IOHEXOL 300 MG/ML  SOLN COMPARISON:   Noncontrast CT 03/08/2021 FINDINGS: Lower chest: Subsegmental atelectasis in both lower lobes. No pleural effusion. Small hiatal hernia. Hepatobiliary: No focal hepatic abnormality. Cholecystectomy. Proximal common bile duct measures 9 mm, normal for postcholecystectomy state.  No visualized choledocholithiasis. Pancreas: There is retroperitoneal stranding but this not appear centered on the pancreas. No evidence of pancreatic inflammation. No pancreatic ductal dilatation or visualized pancreatic mass. Spleen: Normal in size without focal abnormality. Adrenals/Urinary Tract: Normal adrenal glands. No hydronephrosis or perinephric edema. Homogeneous renal enhancement with symmetric excretion on delayed phase imaging. No visualized renal calculi. No focal renal lesion. Urinary bladder is partially distended without wall thickening. There is mild perivesicular fat stranding. Stomach/Bowel: Small hiatal hernia. Decompressed stomach. Small amount of fluid in the duodenum without definite wall thickening. There is mild retroperitoneal edema, but is not appear centered on the duodenum. Occasional fluid-filled loops of small bowel in the central abdomen. No bowel dilatation or obstruction. Occasional small bowel enhancement in the right lower quadrant bowel loops. No small bowel inflammation. Normal appendix. Moderate colonic stool burden with colonic tortuosity. No significant diverticular disease. Vascular/Lymphatic: There is generalized retroperitoneal stranding most prominent at the level of the lower right kidney to the iliac bifurcation. The abdominal aorta is normal in caliber, mild aortic tortuosity. There is no definite aortic thickening, although patient motion artifact limits detailed assessment. IVC and iliac veins are patent. Portal and splenic veins are patent. There is shotty retroperitoneal and central mesenteric adenopathy. Reproductive: Hysterectomy. Septated versus 2 adnexal cysts on the left measures 4.9  x 3.2 cm, chronic and not significantly changed from prior exam allowing for differences in caliper placement. There may be mild stranding adjacent to the ovary. 1.8 cm fluid density structure in the right pelvis was not seen on prior exams. Other: Generalized retroperitoneal stranding, with stranding tracking into both pericolic gutters and the pelvis. Source of inflammation is difficult to delineate. Trace free fluid in the pelvis. No free air. Tiny fat containing umbilical hernia Musculoskeletal: There are no acute or suspicious osseous abnormalities. No evidence of iliopsoas or intramuscular collection. IMPRESSION: 1. Generalized retroperitoneal stranding and inflammation, most prominent at the level of the lower right kidney to the iliac bifurcation. Source of inflammation is difficult to delineate. Differential considerations include primary retroperitoneal fibrosis, unspecified vasculitis, or reactive inflammation related to the primary bowel process, although no definite focal inflamed bowel process is seen as source. Consider short interval follow-up CT to assess for change, consider administration of enteric contrast for better bowel assessment. 2. Septated versus 2 adnexal cysts on the left measures 4.9 x 3.2 cm, chronic and not significantly changed from prior 2020 exam allowing for differences in caliper placement, consistent with benign process. 3. Small hiatal hernia. 4. Moderate colonic stool burden with colonic tortuosity, suggesting constipation. 5. Shotty retroperitoneal and central mesenteric adenopathy is likely reactive. Electronically Signed   By: Keith Rake M.D.   On: 08/10/2021 23:53   Medications: I have reviewed the patient's current medications. Scheduled Meds:  amLODipine  5 mg Oral Daily   cholecalciferol  1,000 Units Oral Daily   enoxaparin (LOVENOX) injection  40 mg Subcutaneous Q24H   estradiol  1.5 mg Oral Daily   gabapentin  300 mg Oral TID   liothyronine  5 mcg Oral  Daily   mirabegron ER  25 mg Oral Daily   pantoprazole  40 mg Oral Daily   polyethylene glycol  17 g Oral BID   valACYclovir  1,000 mg Oral Daily   Continuous Infusions:  sodium chloride 100 mL/hr at 08/12/21 0636   cefTRIAXone (ROCEPHIN)  IV 1 g (08/12/21 1751)   metronidazole 500 mg (08/12/21 0533)   PRN Meds:.acetaminophen **OR** acetaminophen, escitalopram, magnesium hydroxide, nortriptyline, ondansetron **  OR** ondansetron (ZOFRAN) IV, oxyCODONE, SUMAtriptan, tetrahydrozoline, traZODone   Assessment: Active Problems:   Left lower quadrant abdominal pain    Plan: This patient has had 2 CT scans without any pathology seen in the bowel and is not have any bowel symptoms except for her constipation.  The patient is on MiraLAX for that at the present time.  Nothing to do from a GI point of view at this time.  She should follow-up with her primary gastroenterologist at [facility] after discharge.  I will sign off.  Please call if any further GI concerns or questions.  We would like to thank you for the opportunity to participate in the care of Kimberly [name].    LOS: 1 day   Lewayne Bunting 08/12/2021, 9:03 AM Pager [phone] 7am-5pm  Check AMION for 5pm -7am coverage and on weekends

## 2021-08-12 NOTE — Progress Notes (Addendum)
PROGRESS NOTE    Kimberly [name]  HWT:[id number] DOB: [birth year] DOA: 08/10/2021 PCP: Dion Body, MD   Brief Narrative: 61 year old with past medical history significant for asthma, depression, anxiety, GERD, urolithiasis who presented to the ER with acute onset of left lower quadrant abdominal pain, associated dysuria and increased frequency.  Patient reported nausea without vomiting.  She reported constipation. CT abdomen and pelvis: Showed generalized retroperitoneal stranding and inflammation most prominent at the level of the lower right kidney to the iliac bifurcation.  Source of inflammation is difficult to delineate.  Differential includes primary retroperitoneal fibrosis, unspecified back for colitis, reactive inflammation related to a primary bowel process although not definitive focal inflamed bowel process seen.  2 adnexal cyst on the left measure 4.9 x 3.2 chronic no significant changes from prior exam 2020.    Assessment & Plan:   Active Problems:   Left lower quadrant abdominal pain  1-Left lower quadrant abdominal pain: CT abdomen and pelvis: Show generalized retroperitoneal stranding and inflammation most prominent at the level of the lower right kidney to the iliac bifurcation.  Source of inflammation is difficult to delineate.  Differential includes :  inflammatory aortitis retroperitoneal fibrosis, IgG4 related inflammatory disease, or lymphoma. -GI consulted, recommended bowel regimen, no need for endoscopy procedure.  -Continue with IV ceftriaxone and flagyl.  -Repeat CT scan with oral contrast: Stable infiltrative changes within retroperitoneum, perivesicular inflammatory changes, suggesting cystitis, moderate colonic stool burden.  -IV fluids.  -Follow urine culture/  -Oncology consulted for retroperitoneal Adenopathy.  Pain improved.  ESR, ANCA panel ordered. IgG 4.   2-Influenza was Ruled out.  Per admitted patient is positive for influenza.   Respiratory panel negative. Influenza negative.  Influenza negative, tamiflu discontinue.   3-Hypertension: Continue with amlodipine  Hypothyroidism: Continue with Liothyronine.   Anxiety depression: Continue with Xanax and Lexapro  Peripheral neuropathy: Continue with Neurontin GERD: Continue with PPI Sepsis ruled out  History of hematuria. Cystitis.  Discussed with urologist CT results. He recommend patient to keep follow up for Cystoscopy next week.   Estimated body mass index is 27.59 kg/m as calculated from the following:   Height as of this encounter: _0  (1.549 m).   Weight as of this encounter: 66.2 kg.   DVT prophylaxis: Lovenox Code Status: Full code Family Communication: Care discussed with patient.  Disposition Plan:  Status is: Inpatient  Remains inpatient appropriate because: needs IV fluids and IV antibiotics., for abdominal pain unclear etiology.         Consultants:  GI  Procedures:  NOne  Antimicrobials:  IV ceftriaxone, Flagyl.   Subjective: She report improvement of abdominal pain. She has had 2 BM.   Objective: Vitals:   08/11/21 2355 08/12/21 0439 08/12/21 0752 08/12/21 1208  BP: 105/77 120/88 129/84 119/89  Pulse: 90 85 87 100  Resp: _1 Temp: 97.9 F (36.6 C) 97.6 F (36.4 C) 98.1 F (36.7 C) 97.7 F (36.5 C)  TempSrc: Oral     SpO2: 96% 98% 98% 99%  Weight:      Height:        Intake/Output Summary (Last 24 hours) at 08/12/2021 1537 Last data filed at 08/12/2021 1438 Gross per 24 hour  Intake 3648.7 ml  Output --  Net 3648.7 ml    Filed Weights   08/10/21 1736  Weight: 66.2 kg    Examination:  General exam: NAD Respiratory system: CTA Cardiovascular system:  S 1, S 2 RRR Gastrointestinal system: BS  present, soft, nt Central nervous system: Non focal.  Extremities: No edema   Data Reviewed: I have personally reviewed following labs and imaging studies  CBC: Recent Labs  Lab 08/10/21 1741  08/11/21 0526 08/12/21 0812  WBC 10.2 12.2* 6.8  HGB 12.6 11.7* 11.8*  HCT 38.2 35.9* 35.5*  MCV 95.5 96.2 93.7  PLT 303 272 517    Basic Metabolic Panel: Recent Labs  Lab 08/10/21 1741 08/11/21 0526 08/12/21 0812  NA 136 138 137  K 3.9 3.8 3.5  CL 102 101 99  CO2 _0 GLUCOSE 113* 106* 88  BUN 14 10 <5*  CREATININE 1.05* 0.93 0.69  CALCIUM 9.1 8.8* 8.3*    GFR: Estimated Creatinine Clearance: 65.2 mL/min (by C-G formula based on SCr of 0.69 mg/dL). Liver Function Tests: Recent Labs  Lab 08/10/21 1741  AST 20  ALT 12  ALKPHOS 58  BILITOT 0.7  PROT 7.2  ALBUMIN 3.3*    Recent Labs  Lab 08/10/21 1741  LIPASE 34    No results for input(s): AMMONIA in the last 168 hours. Coagulation Profile: No results for input(s): INR, PROTIME in the last 168 hours. Cardiac Enzymes: No results for input(s): CKTOTAL, CKMB, CKMBINDEX, TROPONINI in the last 168 hours. BNP (last 3 results) No results for input(s): PROBNP in the last 8760 hours. HbA1C: No results for input(s): HGBA1C in the last 72 hours. CBG: No results for input(s): GLUCAP in the last 168 hours. Lipid Profile: No results for input(s): CHOL, HDL, LDLCALC, TRIG, CHOLHDL, LDLDIRECT in the last 72 hours. Thyroid Function Tests: No results for input(s): TSH, T4TOTAL, FREET4, T3FREE, THYROIDAB in the last 72 hours. Anemia Panel: No results for input(s): VITAMINB12, FOLATE, FERRITIN, TIBC, IRON, RETICCTPCT in the last 72 hours. Sepsis Labs: No results for input(s): PROCALCITON, LATICACIDVEN in the last 168 hours.  Recent Results (from the past 240 hour(s))  Resp Panel by RT-PCR (Flu A&B, Covid) Nasopharyngeal Swab     Status: None   Collection Time: 08/11/21  3:40 PM   Specimen: Nasopharyngeal Swab; Nasopharyngeal(NP) swabs in vial transport medium  Result Value Ref Range Status   SARS Coronavirus 2 by RT PCR NEGATIVE NEGATIVE Final    Comment: (NOTE) SARS-CoV-2 target nucleic acids are NOT  DETECTED.  The SARS-CoV-2 RNA is generally detectable in upper respiratory specimens during the acute phase of infection. The lowest concentration of SARS-CoV-2 viral copies this assay can detect is 138 copies/mL. A negative result does not preclude SARS-Cov-2 infection and should not be used as the sole basis for treatment or other patient management decisions. A negative result may occur with  improper specimen collection/handling, submission of specimen other than nasopharyngeal swab, presence of viral mutation(s) within the areas targeted by this assay, and inadequate number of viral copies(<138 copies/mL). A negative result must be combined with clinical observations, patient history, and epidemiological information. The expected result is Negative.  Fact Sheet for Patients:  EntrepreneurPulse.com.au  Fact Sheet for Healthcare Providers:  IncredibleEmployment.be  This test is no t yet approved or cleared by the [place] FDA and  has been authorized for detection and/or diagnosis of SARS-CoV-2 by FDA under an Emergency Use Authorization (EUA). This EUA will remain  in effect (meaning this test can be used) for the duration of the COVID-19 declaration under Section 564(b)(1) of the Act, 21 U.S.C.section 360bbb-3(b)(1), unless the authorization is terminated  or revoked sooner.       Influenza A by PCR NEGATIVE NEGATIVE Final  Influenza B by PCR NEGATIVE NEGATIVE Final    Comment: (NOTE) The Xpert Xpress SARS-CoV-2/FLU/RSV plus assay is intended as an aid in the diagnosis of influenza from Nasopharyngeal swab specimens and should not be used as a sole basis for treatment. Nasal washings and aspirates are unacceptable for Xpert Xpress SARS-CoV-2/FLU/RSV testing.  Fact Sheet for Patients: EntrepreneurPulse.com.au  Fact Sheet for Healthcare Providers: IncredibleEmployment.be  This test is not yet  approved or cleared by the [place] FDA and has been authorized for detection and/or diagnosis of SARS-CoV-2 by FDA under an Emergency Use Authorization (EUA). This EUA will remain in effect (meaning this test can be used) for the duration of the COVID-19 declaration under Section 564(b)(1) of the Act, 21 U.S.C. section 360bbb-3(b)(1), unless the authorization is terminated or revoked.  Performed at [facility], [address] [place], [place] [address]          Radiology Studies: CT ABDOMEN PELVIS W CONTRAST  Result Date: 08/11/2021 CLINICAL DATA:  Pyelonephritis, uncomplicated.  Left abdominal pain. EXAM: CT ABDOMEN AND PELVIS WITH CONTRAST TECHNIQUE: Multidetector CT imaging of the abdomen and pelvis was performed using the standard protocol following bolus administration of intravenous contrast. RADIATION DOSE REDUCTION: This exam was performed according to the departmental dose-optimization program which includes automated exposure control, adjustment of the mA and/or kV according to patient size and/or use of iterative reconstruction technique. CONTRAST:  144m OMNIPAQUE IOHEXOL 300 MG/ML  SOLN COMPARISON:  08/10/2021 FINDINGS: Lower chest: Mild bibasilar atelectasis. Cardiac size within normal limits. Small hiatal hernia. Hepatobiliary: No focal liver abnormality is seen. Status post cholecystectomy. Stable mild extrahepatic biliary ductal dilation with the extrahepatic bile duct measuring up to 10 mm within the pancreatic head, possibly representing post cholecystectomy change. No intrahepatic biliary ductal dilation. Pancreas: Unremarkable Spleen: Unremarkable Adrenals/Urinary Tract: The adrenal glands are unremarkable. The kidneys are normal. Bladder is completely decompressed. Mild perivesicular inflammatory stranding persists possibly reflecting a superimposed infectious or inflammatory cystitis. Stomach/Bowel: Moderate stool seen throughout the colon without evidence  of obstruction, similar to prior examination. The stomach, small bowel, and large bowel are otherwise unremarkable. Appendix normal. Vascular/Lymphatic: Shotty retroperitoneal adenopathy is again identified within the left periaortic and aortocaval lymph node groups without pathologic enlargement. Perinodal infiltration is again identified. The findings appears stable since immediate prior examination but, as noted previously, are new since prior examination of 03/08/2021. Differential considerations include inflammatory aortitis retroperitoneal fibrosis, IgG4 related inflammatory disease, or lymphoma. The abdominal vasculature is unremarkable. Specifically, no aortic aneurysm is identified. Reproductive: Status post hysterectomy. Bilateral adnexal cysts are unchanged since remote prior examination of 08/04/2018. I favor the cystic lesions within the left ovary to represent 2 separate cysts with the largest measuring 2.9 x 3.6 cm. Other: No abdominal wall hernia.  Rectum unremarkable. Musculoskeletal: No acute bone abnormality. IMPRESSION: Stable infiltrative change within the retroperitoneum circumferentially surrounding the abdominal aorta. See differential considerations above. Stable bilateral adnexal cysts. Given their stability since remote prior examination of 08/04/2018, a benign process is favored. Moderate colonic stool burden. Stable perivesicular inflammatory stranding, suggesting a superimposed infectious or inflammatory cystitis. The bladder is decompressed. Electronically Signed   By: AFidela SalisburyM.D.   On: 08/11/2021 19:30   CT Abdomen Pelvis W Contrast  Result Date: 08/10/2021 CLINICAL DATA:  Left lower quadrant abdominal pain.  Nausea. EXAM: CT ABDOMEN AND PELVIS WITH CONTRAST TECHNIQUE: Multidetector CT imaging of the abdomen and pelvis was performed using the standard protocol following bolus administration of intravenous contrast. RADIATION  DOSE REDUCTION: This exam was performed  according to the departmental dose-optimization program which includes automated exposure control, adjustment of the mA and/or kV according to patient size and/or use of iterative reconstruction technique. CONTRAST:  170m OMNIPAQUE IOHEXOL 300 MG/ML  SOLN COMPARISON:  Noncontrast CT 03/08/2021 FINDINGS: Lower chest: Subsegmental atelectasis in both lower lobes. No pleural effusion. Small hiatal hernia. Hepatobiliary: No focal hepatic abnormality. Cholecystectomy. Proximal common bile duct measures 9 mm, normal for postcholecystectomy state. No visualized choledocholithiasis. Pancreas: There is retroperitoneal stranding but this not appear centered on the pancreas. No evidence of pancreatic inflammation. No pancreatic ductal dilatation or visualized pancreatic mass. Spleen: Normal in size without focal abnormality. Adrenals/Urinary Tract: Normal adrenal glands. No hydronephrosis or perinephric edema. Homogeneous renal enhancement with symmetric excretion on delayed phase imaging. No visualized renal calculi. No focal renal lesion. Urinary bladder is partially distended without wall thickening. There is mild perivesicular fat stranding. Stomach/Bowel: Small hiatal hernia. Decompressed stomach. Small amount of fluid in the duodenum without definite wall thickening. There is mild retroperitoneal edema, but is not appear centered on the duodenum. Occasional fluid-filled loops of small bowel in the central abdomen. No bowel dilatation or obstruction. Occasional small bowel enhancement in the right lower quadrant bowel loops. No small bowel inflammation. Normal appendix. Moderate colonic stool burden with colonic tortuosity. No significant diverticular disease. Vascular/Lymphatic: There is generalized retroperitoneal stranding most prominent at the level of the lower right kidney to the iliac bifurcation. The abdominal aorta is normal in caliber, mild aortic tortuosity. There is no definite aortic thickening, although  patient motion artifact limits detailed assessment. IVC and iliac veins are patent. Portal and splenic veins are patent. There is shotty retroperitoneal and central mesenteric adenopathy. Reproductive: Hysterectomy. Septated versus 2 adnexal cysts on the left measures 4.9 x 3.2 cm, chronic and not significantly changed from prior exam allowing for differences in caliper placement. There may be mild stranding adjacent to the ovary. 1.8 cm fluid density structure in the right pelvis was not seen on prior exams. Other: Generalized retroperitoneal stranding, with stranding tracking into both pericolic gutters and the pelvis. Source of inflammation is difficult to delineate. Trace free fluid in the pelvis. No free air. Tiny fat containing umbilical hernia Musculoskeletal: There are no acute or suspicious osseous abnormalities. No evidence of iliopsoas or intramuscular collection. IMPRESSION: 1. Generalized retroperitoneal stranding and inflammation, most prominent at the level of the lower right kidney to the iliac bifurcation. Source of inflammation is difficult to delineate. Differential considerations include primary retroperitoneal fibrosis, unspecified vasculitis, or reactive inflammation related to the primary bowel process, although no definite focal inflamed bowel process is seen as source. Consider short interval follow-up CT to assess for change, consider administration of enteric contrast for better bowel assessment. 2. Septated versus 2 adnexal cysts on the left measures 4.9 x 3.2 cm, chronic and not significantly changed from prior 2020 exam allowing for differences in caliper placement, consistent with benign process. 3. Small hiatal hernia. 4. Moderate colonic stool burden with colonic tortuosity, suggesting constipation. 5. Shotty retroperitoneal and central mesenteric adenopathy is likely reactive. Electronically Signed   By: MKeith RakeM.D.   On: 08/10/2021 23:53        Scheduled Meds:   amLODipine  5 mg Oral Daily   cholecalciferol  1,000 Units Oral Daily   enoxaparin (LOVENOX) injection  40 mg Subcutaneous Q24H   estradiol  1.5 mg Oral Daily   gabapentin  300 mg Oral TID   liothyronine  5  mcg Oral Daily   mirabegron ER  25 mg Oral Daily   pantoprazole  40 mg Oral Daily   polyethylene glycol  17 g Oral BID   senna-docusate  1 tablet Oral BID   valACYclovir  1,000 mg Oral Daily   Continuous Infusions:  sodium chloride 100 mL/hr at 08/12/21 0636   cefTRIAXone (ROCEPHIN)  IV Stopped (08/12/21 0708)   metronidazole 500 mg (08/12/21 1358)     LOS: 1 day    Time spent: 35 minutes    Citlalli Weikel A Marchello Rothgeb, MD [facility]   If 7PM-7AM, please contact night-coverage [email or web address]  08/12/2021, 3:37 PM

## 2021-08-13 ENCOUNTER — Other Ambulatory Visit: Payer: Self-pay | Admitting: Internal Medicine

## 2021-08-13 ENCOUNTER — Encounter: Payer: Self-pay | Admitting: Family Medicine

## 2021-08-13 DIAGNOSIS — R590 Localized enlarged lymph nodes: Secondary | ICD-10-CM | POA: Diagnosis not present

## 2021-08-13 LAB — URINE CULTURE: Culture: NO GROWTH

## 2021-08-13 MED ORDER — POLYETHYLENE GLYCOL 3350 17 G PO PACK: 17.0000 g | PACK | Freq: Two times a day (BID) | ORAL | 0 refills | Status: DC

## 2021-08-13 MED ORDER — CEPHALEXIN 500 MG PO CAPS: 500.0000 mg | ORAL_CAPSULE | Freq: Two times a day (BID) | ORAL | 0 refills | Status: AC

## 2021-08-13 MED ORDER — SENNOSIDES-DOCUSATE SODIUM 8.6-50 MG PO TABS: 1.0000 | ORAL_TABLET | Freq: Two times a day (BID) | ORAL | 1 refills | Status: DC

## 2021-08-13 NOTE — Evaluation (Signed)
Physical Therapy Evaluation Patient Details Name: Kimberly [name] MRN: [id number] DOB: Jul 16, 1961 Today's Date: 08/13/2021  History of Present Illness  Patient is a 61 year old female who presents to ED with acute onset of Left lower quadrant abdominal pain with dysuria, urinary frequency/urgency, without hematuria. PMH includes asthma, depression, anxiety, GERD, urolithiasis, brain cyst, cervical fusion syndrome, herpes, syncope, occipital neuralgia, and recurrent falls.   Clinical Impression  Patient is a very pleasant 61 year old female who presents s/p pain in abdomen. Prior to hospital admission, pt was independent and helped take care of her disabled husband and lives in a one story home with 2 steps to enter in front of house.  Currently pt is back to baseline and modified independent with ambulation, stair negotiation, and transfers. Patient ambulated>400 ft without fatigue and performed stair negotiation demonstrating safe ability to return home. At this time all PT needs have been met. I will be happy to see this patient again in the future should disposition change.          Recommendations for follow up therapy are one component of a multi-disciplinary discharge planning process, led by the attending physician.  Recommendations may be updated based on patient status, additional functional criteria and insurance authorization.  Follow Up Recommendations No PT follow up    Assistance Recommended at Discharge None  Patient can return home with the following       Equipment Recommendations None recommended by PT  Recommendations for Other Services       Functional Status Assessment Patient has had a recent decline in their functional status and demonstrates the ability to make significant improvements in function in a reasonable and predictable amount of time.     Precautions / Restrictions Precautions Precautions: None Restrictions Weight Bearing Restrictions: No       Mobility  Bed Mobility Overal bed mobility: Independent             General bed mobility comments: supine to sit EOB without assistance or extra time    Transfers Overall transfer level: Modified independent Equipment used: None               General transfer comment: Extra time in standing due to first time standing in a while, educated on five deep breaths to reduce dizziness.    Ambulation/Gait Ambulation/Gait assistance: Min guard Gait Distance (Feet): 450 Feet Assistive device: IV Pole Gait Pattern/deviations: Shuffle;Step-through pattern;Narrow base of support       General Gait Details: Patient initially was shuffling however with prolonged ambulation she was able to regain larger step lengths.  Stairs Stairs: Yes Stairs assistance: Min guard Stair Management: One rail Left Number of Stairs: 2 General stair comments: WFL, good safety awareness alternating step patterning  Wheelchair Mobility    Modified Rankin (Stroke Patients Only)       Balance Overall balance assessment: Modified Independent (no instability with standing)                                           Pertinent Vitals/Pain Pain Assessment: No/denies pain    Home Living Family/patient expects to be discharged to:: Private residence Living Arrangements: Spouse/significant other Available Help at Discharge: Family Type of Home: House Home Access: Stairs to enter Entrance Stairs-Rails: Chemical engineer of Steps: 2   Home Layout: One level Home Equipment: Grab bars - tub/shower Additional  Comments: Patient helps care for her husband who is disabled as well.    Prior Function Prior Level of Function : Independent/Modified Independent             Mobility Comments: Patient walks independently, no need for AD ADLs Comments: independent     Hand Dominance   Dominant Hand: Right    Extremity/Trunk Assessment   Upper Extremity  Assessment Upper Extremity Assessment: Overall WFL for tasks assessed    Lower Extremity Assessment Lower Extremity Assessment: Overall WFL for tasks assessed (grossly 4+/5 bilaterally)    Cervical / Trunk Assessment Cervical / Trunk Assessment: Normal  Communication   Communication: No difficulties  Cognition Arousal/Alertness: Awake/alert Behavior During Therapy: WFL for tasks assessed/performed Overall Cognitive Status: Within Functional Limits for tasks assessed                                 General Comments: Patient A andO x4        General Comments General comments (skin integrity, edema, etc.): patient appears well nourished and groomed    Exercises Other Exercises Other Exercises: Patient educated on role of PT with acute care setting, safe mobility and transfers, safe stair negotiation.   Assessment/Plan    PT Assessment Patient does not need any further PT services  PT Problem List         PT Treatment Interventions      PT Goals (Current goals can be found in the Care Plan section)  Acute Rehab PT Goals Patient Stated Goal: to return home PT Goal Formulation: With patient Time For Goal Achievement: 08/27/21 Potential to Achieve Goals: Fair    Frequency       Co-evaluation               AM-PAC PT "6 Clicks" Mobility  Outcome Measure Help needed turning from your back to your side while in a flat bed without using bedrails?: None Help needed moving from lying on your back to sitting on the side of a flat bed without using bedrails?: None Help needed moving to and from a bed to a chair (including a wheelchair)?: None Help needed standing up from a chair using your arms (e.g., wheelchair or bedside chair)?: None Help needed to walk in hospital room?: None Help needed climbing 3-5 steps with a railing? : None 6 Click Score: 24    End of Session Equipment Utilized During Treatment: Gait belt Activity Tolerance: Patient tolerated  treatment well Patient left: in chair;with call bell/phone within reach Nurse Communication: Mobility status PT Visit Diagnosis: Muscle weakness (generalized) (M62.81)    Time: 1426-1450 PT Time Calculation (min) (ACUTE ONLY): 24 min   Charges:   PT Evaluation $PT Eval Low Complexity: 1 Low PT Treatments $Therapeutic Exercise: 8-22 mins $Therapeutic Activity: 8-22 mins        Janna Arch, PT, DPT  08/13/2021, 4:19 PM

## 2021-08-13 NOTE — Assessment & Plan Note (Addendum)
#  61 year old female patient currently admitted to hospital for abdominal pain/constipation-noted to have incidental retroperitoneal stranding/mild subcentimeter lymphadenopathy.  #Retroperitoneal stranding/lymphadenopathy-The etiology of retroperitoneal stranding/lymphadenopathy is unclear.  It is unclear if the patient's current symptoms are related to imaging findings/see below.   #Abdominal pain/nausea vomiting- ?  Related to constipation-currently resolved.  Recommendations/PLAN:  #Would recommend discussion at tumor conference regarding further evaluation/work-up of retroperitoneal standing/lymphadenopathy.  Discussed options include imaging like PET scan/also a possible biopsy.  We will contact the patient regarding further work-up after the conference.   Thank you Dr.Regalado for allowing me to participate in the care of your pleasant patient. Please do not hesitate to contact me with questions or concerns in the interim. Discussed with Dr.Regalado.  My office contact was given-so patient could call us if any questions in the interim.  # I reviewed the blood work- with the patient in detail; also reviewed the imaging independently [as summarized above]; and with the patient in detail.

## 2021-08-13 NOTE — Consult Note (Signed)
[facility] CONSULT NOTE  Patient Care Team: Dion Body, MD as PCP - General (Family Medicine)  CHIEF COMPLAINTS/PURPOSE OF CONSULTATION: Retroperitoneal stranding/lymphadenopathy  HISTORY OF PRESENTING ILLNESS:  Kimberly [name] 61 y.o.  female patient with chronic back pain-on disability; noted to have abdominal pain over the last 2 weeks or so.  Left lower quadrant progressive getting worse.  Patient was to be evaluated with GI in approximately 2 weeks.  However given the worsening abdominal pain patient is continued to hospital.  Patient had imaging of the abdomen pelvis that showed-no significant bulky adenopathy; however showed retroperitoneal stranding/shotty lymphadenopathy around the aortic bifurcation.  Patient abdominal pain is improved since improvement of constipation with laxatives.  Diagnosed with possible UTI.  Currently on antibiotics.   GI has evaluated patient-with no further recommendations with regards to retroperitoneal stranding/shotty adenopathy.  Oncology has been consulted for further evaluation recommendations.  Patient denies any night sweats.  No fevers.  Denies any chills.  No weight loss.  Currently abdominal pain is improved.  Patient eager to go home.  Review of Systems  Constitutional:  Negative for chills, diaphoresis, fever, malaise/fatigue and weight loss.  HENT:  Negative for nosebleeds and sore throat.   Eyes:  Negative for double vision.  Respiratory:  Negative for cough, hemoptysis, sputum production, shortness of breath and wheezing.   Cardiovascular:  Negative for chest pain, palpitations, orthopnea and leg swelling.  Gastrointestinal:  Positive for abdominal pain and nausea. Negative for blood in stool, constipation, heartburn, melena and vomiting.  Genitourinary:  Negative for dysuria, frequency and urgency.  Musculoskeletal:  Positive for back pain, joint pain and neck pain.  Skin: Negative.  Negative for itching and  rash.  Neurological:  Negative for dizziness, tingling, focal weakness, weakness and headaches.  Endo/Heme/Allergies:  Does not bruise/bleed easily.  Psychiatric/Behavioral:  Negative for depression. The patient is not nervous/anxious and does not have insomnia.     MEDICAL HISTORY:  Past Medical History:  Diagnosis Date   Anxiety    takes alprazolam - rare use    Arthritis    cervical spondylosis    Asthma    Balance problem    Brain cyst    Cervical fusion syndrome    Complete rupture of rotator cuff    Depression    Difficult intubation    Small mouth opening, limited neck flexion, very anterior    Dyspnea    Flu 08/10/2018   tested positive   GERD (gastroesophageal reflux disease)    pt. reports that its better, no meds in use at this time- 2015   Headache    Herpes    History of kidney stones    History of pneumonia    Hypertension    pt. doesn't see cardiologist, followed for HTN by Dr. Gerarda Fraction   Inflammation of shoulder joint    Memory difficulties    Neuromuscular disorder ([place])    joint and muscle problems   Occipital neuralgia    Pneumothorax on right    following C4-6 ACDF 07/04/16   PONV (postoperative nausea and vomiting)    WITH SURGERY 07/04/16 (NECK) RT LUNG COLLAPSED   Pseudoarthrosis of cervical spine (HCC)    Recurrent falls    Syncope    Urinary frequency     SURGICAL HISTORY: Past Surgical History:  Procedure Laterality Date   ABDOMINAL HYSTERECTOMY     ANTERIOR CERVICAL DECOMP/DISCECTOMY FUSION N/A 08/06/2014   Procedure: ANTERIOR CERVICAL DECOMPRESSION/DISCECTOMY FUSION CERVICAL THREE-FOUR,CERVICAL SIX-SEVEN ,CERVICAL SEVEN-THORACIC  ONE;  Surgeon: Floyce Stakes, MD;  Location: [facility];  Service: Neurosurgery;  Laterality: N/A;   ANTERIOR CERVICAL DECOMP/DISCECTOMY FUSION N/A 07/04/2016   Procedure: CERVICAL FOUR-FIVE, CERVICAL FIVE-SIX ANTERIOR CERVICAL DECOMPRESSION/DISCECTOMY/FUSION WITH REVISION OF CERVICAL THREE-FOUR PLATE;  Surgeon: Leeroy Cha, MD;  Location: [facility];  Service: Neurosurgery;  Laterality: N/A;   CARPAL TUNNEL RELEASE Left 02/16/2013   Procedure: CARPAL TUNNEL RELEASE;  Surgeon: Carole Civil, MD;  Location: [facility];  Service: Orthopedics;  Laterality: Left;   CARPAL TUNNEL RELEASE Right 03/27/2013   Procedure: RIGHT CARPAL TUNNEL RELEASE;  Surgeon: Carole Civil, MD;  Location: [facility];  Service: Orthopedics;  Laterality: Right;   CESAREAN SECTION     x2   CHOLECYSTECTOMY N/A 06/17/2015   Procedure: LAPAROSCOPIC CHOLECYSTECTOMY;  Surgeon: Aviva Signs, MD;  Location: [facility];  Service: General;  Laterality: N/A;   COLONOSCOPY     COLONOSCOPY WITH PROPOFOL N/A 05/06/2019   Procedure: COLONOSCOPY WITH PROPOFOL;  Surgeon: Robert Bellow, MD;  Location: [facility];  Service: Endoscopy;  Laterality: N/A;   ESOPHAGOGASTRODUODENOSCOPY N/A 05/08/2021   Procedure: ESOPHAGOGASTRODUODENOSCOPY (EGD);  Surgeon: Lesly Rubenstein, MD;  Location[facility];  Service: Endoscopy;  Laterality: N/A;   EXTRACORPOREAL SHOCK WAVE LITHOTRIPSY Left 09/04/2018   Procedure: EXTRACORPOREAL SHOCK WAVE LITHOTRIPSY (ESWL);  Surgeon: Billey Co, MD;  Location: [facility];  Service: Urology;  Laterality: Left;   FOREIGN BODY REMOVAL Left    knee-as child   KNEE ARTHROSCOPY     NASAL SINUS SURGERY N/A 04/13/2015   Procedure: nasal endoscopy with adenoid biopsy;  Surgeon: Ruby Cola, MD;  Location: [place];  Service: ENT;  Laterality: N/A;   NECK SURGERY  2016   x 3 all together   POSTERIOR CERVICAL FUSION/FORAMINOTOMY N/A 11/13/2016   Procedure: CERVICAL TWO-CERVICAL SIX POSTERIOR CERVICAL FUSION WITH LATERAL MASS FIXATION;  Surgeon: Kristeen Miss, MD;  Location: [facility];  Service: Neurosurgery;  Laterality: N/A;  posterior approach   REDUCTION MAMMAPLASTY     ROTATOR CUFF REPAIR Left    SHOULDER ACROMIOPLASTY Left 05/18/2015   Procedure: SHOULDER ACROMIOPLASTY;  Surgeon: Earlie Server, MD;  Location: [facility];  Service: Orthopedics;  Laterality: Left;   SHOULDER ARTHROSCOPY WITH DISTAL CLAVICLE RESECTION Left 05/18/2015   Procedure: LEFT SHOULDER ARTHROSCOPY WITH  DISTAL CLAVICLE RESECTION;  Surgeon: Earlie Server, MD;  Location: [facility] [facility];  Service: Orthopedics;  Laterality: Left;    SOCIAL HISTORY: Social History   Socioeconomic History   Marital status: Married    Spouse name: Horticulturist, commercial   Number of children: Not on file   Years of education: Not on file   Highest education level: Not on file  Occupational History    Comment: disabled  Tobacco Use   Smoking status: Never   Smokeless tobacco: Never  Vaping Use   Vaping Use: Never used  Substance and Sexual Activity   Alcohol use: No   Drug use: No   Sexual activity: Yes    Birth control/protection: Surgical  Other Topics Concern   Not on file  Social History Narrative   Patient is disabled; no smoking no alcohol.  She takes care of her husband-who is on dialysis.  She has 2 grownup sons.   Social Determinants of Health   Financial Resource Strain: Not on file  Food Insecurity: Not on file  Transportation Needs: Not on file  Physical Activity: Not on file  Stress: Not on file  Social Connections: Not  on file  Intimate Partner Violence: Not on file    FAMILY HISTORY: Family History  Problem Relation Age of Onset   Cancer Father        prostate cancer   Hypertension Father    Hypertension Other    Breast cancer Neg Hx     ALLERGIES:  is allergic to shellfish allergy, peanut-containing drug products, codeine, diphtheria toxoid, and tetanus toxoids.  MEDICATIONS:  Current Facility-Administered Medications  Medication Dose Route Frequency Provider Last Rate Last Admin   0.9 %  sodium chloride infusion   Intravenous Continuous Regalado, Belkys A, MD 50 mL/hr at 08/12/21 2053 New Bag at 08/12/21 2053   acetaminophen (TYLENOL) tablet 650 mg  650 mg Oral Q6H PRN Mansy, Jan A, MD   650 mg at 08/13/21  2637   Or   acetaminophen (TYLENOL) suppository 650 mg  650 mg Rectal Q6H PRN Mansy, Jan A, MD       amLODipine (NORVASC) tablet 5 mg  5 mg Oral Daily Mansy, Jan A, MD   5 mg at 08/13/21 0952   cefTRIAXone (ROCEPHIN) 1 g in sodium chloride 0.9 % 100 mL IVPB  1 g Intravenous Q24H Mansy, Jan A, MD 200 mL/hr at 08/13/21 0639 1 g at 08/13/21 8588   cholecalciferol (VITAMIN D) tablet 1,000 Units  1,000 Units Oral Daily Mansy, Jan A, MD   1,000 Units at 08/13/21 0952   enoxaparin (LOVENOX) injection 40 mg  40 mg Subcutaneous Q24H Mansy, Jan A, MD   40 mg at 08/13/21 0952   escitalopram (LEXAPRO) tablet 10 mg  10 mg Oral Daily PRN Mansy, Jan A, MD       estradiol (ESTRACE) tablet 1.5 mg  1.5 mg Oral Daily Renda Rolls, RPH   1.5 mg at 08/13/21 0953   gabapentin (NEURONTIN) capsule 300 mg  300 mg Oral TID Mansy, Jan A, MD   300 mg at 08/13/21 1553   liothyronine (CYTOMEL) tablet 5 mcg  5 mcg Oral Daily Mansy, Jan A, MD   5 mcg at 08/13/21 5027   magnesium hydroxide (MILK OF MAGNESIA) suspension 30 mL  30 mL Oral Daily PRN Mansy, Jan A, MD       mirabegron ER (MYRBETRIQ) tablet 25 mg  25 mg Oral Daily Lockie Mola B, RPH   25 mg at 08/13/21 0953   nortriptyline (PAMELOR) capsule 10 mg  10 mg Oral QHS PRN Mansy, Jan A, MD       ondansetron [facility]) tablet 4 mg  4 mg Oral Q6H PRN Mansy, Jan A, MD   4 mg at 08/13/21 1553   Or   ondansetron (ZOFRAN) injection 4 mg  4 mg Intravenous Q6H PRN Mansy, Jan A, MD   4 mg at 08/11/21 0515   oxyCODONE (Oxy IR/ROXICODONE) immediate release tablet 10 mg  10 mg Oral Q8H PRN Mansy, Jan A, MD   10 mg at 08/13/21 1350   pantoprazole (PROTONIX) EC tablet 40 mg  40 mg Oral Daily Mansy, Jan A, MD   40 mg at 08/13/21 0952   polyethylene glycol (MIRALAX / GLYCOLAX) packet 17 g  17 g Oral BID Regalado, Belkys A, MD   17 g at 08/11/21 2205   senna-docusate (Senokot-S) tablet 1 tablet  1 tablet Oral BID Regalado, Belkys A, MD   1 tablet at 08/13/21 0952   SUMAtriptan (IMITREX)  tablet 50 mg  50 mg Oral Q2H PRN Mansy, Arvella Merles, MD       tetrahydrozoline  0.05 % ophthalmic solution 1 drop  1 drop Both Eyes Daily PRN Mansy, Jan A, MD       traZODone (DESYREL) tablet 25 mg  25 mg Oral QHS PRN Mansy, Jan A, MD       valACYclovir (VALTREX) tablet 1,000 mg  1,000 mg Oral Daily Mansy, Jan A, MD   1,000 mg at 08/13/21 9323   Current Outpatient Medications  Medication Sig Dispense Refill   acetaminophen (TYLENOL) 500 MG tablet Take 500 mg by mouth 2 (two) times daily as needed for headache.     amLODipine (NORVASC) 5 MG tablet Take 5 mg by mouth daily.      cephALEXin (KEFLEX) 500 MG capsule Take 1 capsule (500 mg total) by mouth 2 (two) times daily for 2 days. 4 capsule 0   Cholecalciferol 75 MCG (3000 UT) TABS Take 3,000 Units by mouth daily.     escitalopram (LEXAPRO) 10 MG tablet Take 10 mg by mouth daily as needed (anxiety).      estradiol (ESTRACE) 1 MG tablet Take 1.5 mg by mouth daily.     gabapentin (NEURONTIN) 300 MG capsule Take 1 capsule (300 mg total) by mouth 3 (three) times daily. 90 capsule 5   liothyronine (CYTOMEL) 5 MCG tablet Take 5 mcg by mouth daily.     nortriptyline (PAMELOR) 10 MG capsule Take 10 mg by mouth at bedtime as needed for sleep.     Oxycodone HCl 10 MG TABS Take 1 tablet (10 mg total) by mouth every 8 (eight) hours as needed. Must last 30 days. 90 tablet 0   pantoprazole (PROTONIX) 40 MG tablet Take 40 mg by mouth daily.     rizatriptan (MAXALT) 10 MG tablet Take 10 mg by mouth as needed for migraine. May repeat in 2 hours if needed     Tetrahydrozoline HCl (VISINE OP) Apply 1 drop to eye daily as needed (irritation).     valACYclovir (VALTREX) 1000 MG tablet Take 1,000 mg by mouth daily.     Vibegron (GEMTESA) 75 MG TABS Take 75 mg by mouth daily. 14 tablet 0   polyethylene glycol (MIRALAX / GLYCOLAX) 17 g packet Take 17 g by mouth 2 (two) times daily. 14 each 0   senna-docusate (SENOKOT-S) 8.6-50 MG tablet Take 1 tablet by mouth 2 (two) times  daily. 30 tablet 1      .  PHYSICAL EXAMINATION:  Vitals:   08/13/21 0758 08/13/21 1206  BP: 106/78 109/80  Pulse: 84 88  Resp: 18 16  Temp: 97.9 F (36.6 C) 97.7 F (36.5 C)  SpO2: 96% 97%   Filed Weights   08/10/21 1736  Weight: 146 lb (66.2 kg)    Physical Exam Vitals and nursing note reviewed.  HENT:     Head: Normocephalic and atraumatic.     Mouth/Throat:     Pharynx: Oropharynx is clear.  Eyes:     Extraocular Movements: Extraocular movements intact.     Pupils: Pupils are equal, round, and reactive to light.  Cardiovascular:     Rate and Rhythm: Normal rate and regular rhythm.  Pulmonary:     Comments: Decreased breath sounds bilaterally.  Abdominal:     Palpations: Abdomen is soft.  Musculoskeletal:        General: Normal range of motion.     Cervical back: Normal range of motion.  Skin:    General: Skin is warm.  Neurological:     General: No focal deficit present.  Mental Status: She is alert and oriented to person, place, and time.  Psychiatric:        Behavior: Behavior normal.        Judgment: Judgment normal.     LABORATORY DATA:  I have reviewed the data as listed Lab Results  Component Value Date   WBC 6.8 08/12/2021   HGB 11.8 (L) 08/12/2021   HCT 35.5 (L) 08/12/2021   MCV 93.7 08/12/2021   PLT 249 08/12/2021   Recent Labs    08/10/21 1741 08/11/21 0526 08/12/21 0812  NA 136 138 137  K 3.9 3.8 3.5  CL 102 101 99  CO2 26 28 28   GLUCOSE 113* 106* 88  BUN 14 10 <5*  CREATININE 1.05* 0.93 0.69  CALCIUM 9.1 8.8* 8.3*  GFRNONAA >60 >60 >60  PROT 7.2  --   --   ALBUMIN 3.3*  --   --   AST 20  --   --   ALT 12  --   --   ALKPHOS 58  --   --   BILITOT 0.7  --   --     RADIOGRAPHIC STUDIES: I have personally reviewed the radiological images as listed and agreed with the findings in the report. CT ABDOMEN PELVIS W CONTRAST  Result Date: 08/11/2021 CLINICAL DATA:  Pyelonephritis, uncomplicated.  Left abdominal pain.  EXAM: CT ABDOMEN AND PELVIS WITH CONTRAST TECHNIQUE: Multidetector CT imaging of the abdomen and pelvis was performed using the standard protocol following bolus administration of intravenous contrast. RADIATION DOSE REDUCTION: This exam was performed according to the departmental dose-optimization program which includes automated exposure control, adjustment of the mA and/or kV according to patient size and/or use of iterative reconstruction technique. CONTRAST:  126mL OMNIPAQUE IOHEXOL 300 MG/ML  SOLN COMPARISON:  08/10/2021 FINDINGS: Lower chest: Mild bibasilar atelectasis. Cardiac size within normal limits. Small hiatal hernia. Hepatobiliary: No focal liver abnormality is seen. Status post cholecystectomy. Stable mild extrahepatic biliary ductal dilation with the extrahepatic bile duct measuring up to 10 mm within the pancreatic head, possibly representing post cholecystectomy change. No intrahepatic biliary ductal dilation. Pancreas: Unremarkable Spleen: Unremarkable Adrenals/Urinary Tract: The adrenal glands are unremarkable. The kidneys are normal. Bladder is completely decompressed. Mild perivesicular inflammatory stranding persists possibly reflecting a superimposed infectious or inflammatory cystitis. Stomach/Bowel: Moderate stool seen throughout the colon without evidence of obstruction, similar to prior examination. The stomach, small bowel, and large bowel are otherwise unremarkable. Appendix normal. Vascular/Lymphatic: Shotty retroperitoneal adenopathy is again identified within the left periaortic and aortocaval lymph node groups without pathologic enlargement. Perinodal infiltration is again identified. The findings appears stable since immediate prior examination but, as noted previously, are new since prior examination of 03/08/2021. Differential considerations include inflammatory aortitis retroperitoneal fibrosis, IgG4 related inflammatory disease, or lymphoma. The abdominal vasculature is  unremarkable. Specifically, no aortic aneurysm is identified. Reproductive: Status post hysterectomy. Bilateral adnexal cysts are unchanged since remote prior examination of 08/04/2018. I favor the cystic lesions within the left ovary to represent 2 separate cysts with the largest measuring 2.9 x 3.6 cm. Other: No abdominal wall hernia.  Rectum unremarkable. Musculoskeletal: No acute bone abnormality. IMPRESSION: Stable infiltrative change within the retroperitoneum circumferentially surrounding the abdominal aorta. See differential considerations above. Stable bilateral adnexal cysts. Given their stability since remote prior examination of 08/04/2018, a benign process is favored. Moderate colonic stool burden. Stable perivesicular inflammatory stranding, suggesting a superimposed infectious or inflammatory cystitis. The bladder is decompressed. Electronically Signed   By: Fidela Salisbury  M.D.   On: 08/11/2021 19:30   CT Abdomen Pelvis W Contrast  Result Date: 08/10/2021 CLINICAL DATA:  Left lower quadrant abdominal pain.  Nausea. EXAM: CT ABDOMEN AND PELVIS WITH CONTRAST TECHNIQUE: Multidetector CT imaging of the abdomen and pelvis was performed using the standard protocol following bolus administration of intravenous contrast. RADIATION DOSE REDUCTION: This exam was performed according to the departmental dose-optimization program which includes automated exposure control, adjustment of the mA and/or kV according to patient size and/or use of iterative reconstruction technique. CONTRAST:  184mL OMNIPAQUE IOHEXOL 300 MG/ML  SOLN COMPARISON:  Noncontrast CT 03/08/2021 FINDINGS: Lower chest: Subsegmental atelectasis in both lower lobes. No pleural effusion. Small hiatal hernia. Hepatobiliary: No focal hepatic abnormality. Cholecystectomy. Proximal common bile duct measures 9 mm, normal for postcholecystectomy state. No visualized choledocholithiasis. Pancreas: There is retroperitoneal stranding but this not appear  centered on the pancreas. No evidence of pancreatic inflammation. No pancreatic ductal dilatation or visualized pancreatic mass. Spleen: Normal in size without focal abnormality. Adrenals/Urinary Tract: Normal adrenal glands. No hydronephrosis or perinephric edema. Homogeneous renal enhancement with symmetric excretion on delayed phase imaging. No visualized renal calculi. No focal renal lesion. Urinary bladder is partially distended without wall thickening. There is mild perivesicular fat stranding. Stomach/Bowel: Small hiatal hernia. Decompressed stomach. Small amount of fluid in the duodenum without definite wall thickening. There is mild retroperitoneal edema, but is not appear centered on the duodenum. Occasional fluid-filled loops of small bowel in the central abdomen. No bowel dilatation or obstruction. Occasional small bowel enhancement in the right lower quadrant bowel loops. No small bowel inflammation. Normal appendix. Moderate colonic stool burden with colonic tortuosity. No significant diverticular disease. Vascular/Lymphatic: There is generalized retroperitoneal stranding most prominent at the level of the lower right kidney to the iliac bifurcation. The abdominal aorta is normal in caliber, mild aortic tortuosity. There is no definite aortic thickening, although patient motion artifact limits detailed assessment. IVC and iliac veins are patent. Portal and splenic veins are patent. There is shotty retroperitoneal and central mesenteric adenopathy. Reproductive: Hysterectomy. Septated versus 2 adnexal cysts on the left measures 4.9 x 3.2 cm, chronic and not significantly changed from prior exam allowing for differences in caliper placement. There may be mild stranding adjacent to the ovary. 1.8 cm fluid density structure in the right pelvis was not seen on prior exams. Other: Generalized retroperitoneal stranding, with stranding tracking into both pericolic gutters and the pelvis. Source of inflammation  is difficult to delineate. Trace free fluid in the pelvis. No free air. Tiny fat containing umbilical hernia Musculoskeletal: There are no acute or suspicious osseous abnormalities. No evidence of iliopsoas or intramuscular collection. IMPRESSION: 1. Generalized retroperitoneal stranding and inflammation, most prominent at the level of the lower right kidney to the iliac bifurcation. Source of inflammation is difficult to delineate. Differential considerations include primary retroperitoneal fibrosis, unspecified vasculitis, or reactive inflammation related to the primary bowel process, although no definite focal inflamed bowel process is seen as source. Consider short interval follow-up CT to assess for change, consider administration of enteric contrast for better bowel assessment. 2. Septated versus 2 adnexal cysts on the left measures 4.9 x 3.2 cm, chronic and not significantly changed from prior 2020 exam allowing for differences in caliper placement, consistent with benign process. 3. Small hiatal hernia. 4. Moderate colonic stool burden with colonic tortuosity, suggesting constipation. 5. Shotty retroperitoneal and central mesenteric adenopathy is likely reactive. Electronically Signed   By: Keith Rake M.D.   On: 08/10/2021 23:53  Retroperitoneal lymphadenopathy #61 year old female patient currently admitted to hospital for abdominal pain/constipation-noted to have incidental retroperitoneal stranding/mild subcentimeter lymphadenopathy.  #Retroperitoneal stranding/lymphadenopathy-The etiology of retroperitoneal stranding/lymphadenopathy is unclear.  It is unclear if the patient's current symptoms are related to imaging findings/see below.   #Abdominal pain/nausea vomiting- ?  Related to constipation-currently resolved.  Recommendations/PLAN:  #Would recommend discussion at tumor conference regarding further evaluation/work-up of retroperitoneal standing/lymphadenopathy.  Discussed options  include imaging like PET scan/also a possible biopsy.  We will contact the patient regarding further work-up after the conference.   Thank you Dr.Regalado for allowing me to participate in the care of your pleasant patient. Please do not hesitate to contact me with questions or concerns in the interim. Discussed with Dr.Regalado.  My office contact was given-so patient could call us if any questions in the interim.  # I reviewed the blood work- with the patient in detail; also reviewed the imaging independently [as summarized above]; and with the patient in detail.   All questions were answered. The patient knows to call the clinic with any problems, questions or concerns.    Cammie Sickle, MD 08/13/2021 10:02 PM

## 2021-08-13 NOTE — TOC CM/SW Note (Addendum)
Notified by RN Ok Edwards that patient needs a ride home and is appropriate for uber/taxi. Address confirmed. [place] ride arranged for 5:30 pick up per RN request. Left driver instructions with [facility] phone number for driver to call 5 minutes prior to arrival.  [employer] printed to unit as back up.  Oleh Genin, [place]

## 2021-08-13 NOTE — Progress Notes (Signed)
PROGRESS NOTE    Kimberly [name]  QKM:[id number] DOB: [birth year] DOA: 08/10/2021 PCP: Dion Body, MD   Brief Narrative: 61 year old with past medical history significant for asthma, depression, anxiety, GERD, urolithiasis who presented to the ER with acute onset of left lower quadrant abdominal pain, associated dysuria and increased frequency.  Patient reported nausea without vomiting.  She reported constipation. CT abdomen and pelvis: Showed generalized retroperitoneal stranding and inflammation most prominent at the level of the lower right kidney to the iliac bifurcation.  Source of inflammation is difficult to delineate.  Differential includes primary retroperitoneal fibrosis, unspecified back for colitis, reactive inflammation related to a primary bowel process although not definitive focal inflamed bowel process seen.  2 adnexal cyst on the left measure 4.9 x 3.2 chronic no significant changes from prior exam 2020.    Assessment & Plan:   Active Problems:   Left lower quadrant abdominal pain  1-Left lower quadrant abdominal pain: -CT abdomen and pelvis: Show generalized retroperitoneal stranding and inflammation most prominent at the level of the lower right kidney to the iliac bifurcation.  Source of inflammation is difficult to delineate.  Differential includes :  inflammatory aortitis retroperitoneal fibrosis, IgG4 related inflammatory disease, or lymphoma. -GI consulted, recommended bowel regimen, no need for endoscopy procedure.  --Treated  with IV ceftriaxone and flagyl. Discontinue flagyl.  -Repeat CT scan with oral contrast: Stable infiltrative changes within retroperitoneum, perivesicular inflammatory changes, suggesting cystitis, moderate colonic stool burden.  -Urine culture no growth to date.  -Oncology consulted for retroperitoneal Adenopathy, infiltratives changes. .  ESR: 35, ANCA panel ordered. IgG 4.  Pain resolve. Home depending on oncology  evaluation.  2-Influenza was Ruled out.  Respiratory panel negative. Influenza negative.  Influenza negative, tamiflu discontinue.   3-Hypertension: Continue with amlodipine  Hypothyroidism: Continue with Liothyronine.   Anxiety depression: Continue with Xanax and Lexapro  Peripheral neuropathy: Continue with Neurontin GERD: Continue with PPI Sepsis ruled out  History of hematuria. Cystitis.  Discussed with urologist CT results. He recommend patient to keep follow up for Cystoscopy next week.   Estimated body mass index is 27.59 kg/m as calculated from the following:   Height as of this encounter: '5\' 1"'  (1.549 m).   Weight as of this encounter: 66.2 kg.   DVT prophylaxis: Lovenox Code Status: Full code Family Communication: Care discussed with patient.  Disposition Plan:  Status is: Inpatient  Remains inpatient appropriate because: Home today depending on Oncology recommendations.         Consultants:  GI  Procedures:  NOne  Antimicrobials:  IV ceftriaxone-- Flagyl discontinue 1/15  Subjective: She report feeling much better. Pain has resolved.  Had multiples BM  Objective: Vitals:   08/12/21 2025 08/13/21 0533 08/13/21 0758 08/13/21 1206  BP: 103/70 130/89 106/78 109/80  Pulse: 89 83 84 88  Resp: '19 17 18 16  ' Temp: 97.9 F (36.6 C) 97.9 F (36.6 C) 97.9 F (36.6 C) 97.7 F (36.5 C)  TempSrc: Oral Oral Oral Oral  SpO2: 98% 99% 96% 97%  Weight:      Height:        Intake/Output Summary (Last 24 hours) at 08/13/2021 1436 Last data filed at 08/13/2021 0900 Gross per 24 hour  Intake 1007.67 ml  Output --  Net 1007.67 ml    Filed Weights   08/10/21 1736  Weight: 66.2 kg    Examination:  General exam: NAD Respiratory system: CTA Cardiovascular system:  S 1, S 2 RRR Gastrointestinal system: BS Present,  soft, nt Central nervous system: Non focal.  Extremities: no edema   Data Reviewed: I have personally reviewed following labs and  imaging studies  CBC: Recent Labs  Lab 08/10/21 1741 08/11/21 0526 08/12/21 0812  WBC 10.2 12.2* 6.8  HGB 12.6 11.7* 11.8*  HCT 38.2 35.9* 35.5*  MCV 95.5 96.2 93.7  PLT 303 272 409    Basic Metabolic Panel: Recent Labs  Lab 08/10/21 1741 08/11/21 0526 08/12/21 0812  NA 136 138 137  K 3.9 3.8 3.5  CL 102 101 99  CO2 '26 28 28  ' GLUCOSE 113* 106* 88  BUN 14 10 <5*  CREATININE 1.05* 0.93 0.69  CALCIUM 9.1 8.8* 8.3*    GFR: Estimated Creatinine Clearance: 65.2 mL/min (by C-G formula based on SCr of 0.69 mg/dL). Liver Function Tests: Recent Labs  Lab 08/10/21 1741  AST 20  ALT 12  ALKPHOS 58  BILITOT 0.7  PROT 7.2  ALBUMIN 3.3*    Recent Labs  Lab 08/10/21 1741  LIPASE 34    No results for input(s): AMMONIA in the last 168 hours. Coagulation Profile: No results for input(s): INR, PROTIME in the last 168 hours. Cardiac Enzymes: No results for input(s): CKTOTAL, CKMB, CKMBINDEX, TROPONINI in the last 168 hours. BNP (last 3 results) No results for input(s): PROBNP in the last 8760 hours. HbA1C: No results for input(s): HGBA1C in the last 72 hours. CBG: No results for input(s): GLUCAP in the last 168 hours. Lipid Profile: No results for input(s): CHOL, HDL, LDLCALC, TRIG, CHOLHDL, LDLDIRECT in the last 72 hours. Thyroid Function Tests: No results for input(s): TSH, T4TOTAL, FREET4, T3FREE, THYROIDAB in the last 72 hours. Anemia Panel: No results for input(s): VITAMINB12, FOLATE, FERRITIN, TIBC, IRON, RETICCTPCT in the last 72 hours. Sepsis Labs: No results for input(s): PROCALCITON, LATICACIDVEN in the last 168 hours.  Recent Results (from the past 240 hour(s))  Urine Culture     Status: None   Collection Time: 08/11/21  3:34 PM   Specimen: Urine, Random  Result Value Ref Range Status   Specimen Description   Final    URINE, RANDOM Performed at [facility], [address], [place], [place] [address]    Special Requests   Final     NONE Performed at [facility], [address], [place], [place] [address]    Culture   Final    NO GROWTH Performed at [facility], [place] [address] [place], [place] [address]    Report Status 08/13/2021 FINAL  Final  Resp Panel by RT-PCR (Flu A&B, Covid) Nasopharyngeal Swab     Status: None   Collection Time: 08/11/21  3:40 PM   Specimen: Nasopharyngeal Swab; Nasopharyngeal(NP) swabs in vial transport medium  Result Value Ref Range Status   SARS Coronavirus 2 by RT PCR NEGATIVE NEGATIVE Final    Comment: (NOTE) SARS-CoV-2 target nucleic acids are NOT DETECTED.  The SARS-CoV-2 RNA is generally detectable in upper respiratory specimens during the acute phase of infection. The lowest concentration of SARS-CoV-2 viral copies this assay can detect is 138 copies/mL. A negative result does not preclude SARS-Cov-2 infection and should not be used as the sole basis for treatment or other patient management decisions. A negative result may occur with  improper specimen collection/handling, submission of specimen other than nasopharyngeal swab, presence of viral mutation(s) within the areas targeted by this assay, and inadequate number of viral copies(<138 copies/mL). A negative result must be combined with clinical observations, patient history,  and epidemiological information. The expected result is Negative.  Fact Sheet for Patients:  EntrepreneurPulse.com.au  Fact Sheet for Healthcare Providers:  IncredibleEmployment.be  This test is no t yet approved or cleared by the [place] FDA and  has been authorized for detection and/or diagnosis of SARS-CoV-2 by FDA under an Emergency Use Authorization (EUA). This EUA will remain  in effect (meaning this test can be used) for the duration of the COVID-19 declaration under Section 564(b)(1) of the Act, 21 U.S.C.section 360bbb-3(b)(1), unless the authorization is terminated  or revoked  sooner.       Influenza A by PCR NEGATIVE NEGATIVE Final   Influenza B by PCR NEGATIVE NEGATIVE Final    Comment: (NOTE) The Xpert Xpress SARS-CoV-2/FLU/RSV plus assay is intended as an aid in the diagnosis of influenza from Nasopharyngeal swab specimens and should not be used as a sole basis for treatment. Nasal washings and aspirates are unacceptable for Xpert Xpress SARS-CoV-2/FLU/RSV testing.  Fact Sheet for Patients: EntrepreneurPulse.com.au  Fact Sheet for Healthcare Providers: IncredibleEmployment.be  This test is not yet approved or cleared by the [place] FDA and has been authorized for detection and/or diagnosis of SARS-CoV-2 by FDA under an Emergency Use Authorization (EUA). This EUA will remain in effect (meaning this test can be used) for the duration of the COVID-19 declaration under Section 564(b)(1) of the Act, 21 U.S.C. section 360bbb-3(b)(1), unless the authorization is terminated or revoked.  Performed at [facility], [address], [place], [place] [address]          Radiology Studies: CT ABDOMEN PELVIS W CONTRAST  Result Date: 08/11/2021 CLINICAL DATA:  Pyelonephritis, uncomplicated.  Left abdominal pain. EXAM: CT ABDOMEN AND PELVIS WITH CONTRAST TECHNIQUE: Multidetector CT imaging of the abdomen and pelvis was performed using the standard protocol following bolus administration of intravenous contrast. RADIATION DOSE REDUCTION: This exam was performed according to the departmental dose-optimization program which includes automated exposure control, adjustment of the mA and/or kV according to patient size and/or use of iterative reconstruction technique. CONTRAST:  141m OMNIPAQUE IOHEXOL 300 MG/ML  SOLN COMPARISON:  08/10/2021 FINDINGS: Lower chest: Mild bibasilar atelectasis. Cardiac size within normal limits. Small hiatal hernia. Hepatobiliary: No focal liver abnormality is seen. Status post  cholecystectomy. Stable mild extrahepatic biliary ductal dilation with the extrahepatic bile duct measuring up to 10 mm within the pancreatic head, possibly representing post cholecystectomy change. No intrahepatic biliary ductal dilation. Pancreas: Unremarkable Spleen: Unremarkable Adrenals/Urinary Tract: The adrenal glands are unremarkable. The kidneys are normal. Bladder is completely decompressed. Mild perivesicular inflammatory stranding persists possibly reflecting a superimposed infectious or inflammatory cystitis. Stomach/Bowel: Moderate stool seen throughout the colon without evidence of obstruction, similar to prior examination. The stomach, small bowel, and large bowel are otherwise unremarkable. Appendix normal. Vascular/Lymphatic: Shotty retroperitoneal adenopathy is again identified within the left periaortic and aortocaval lymph node groups without pathologic enlargement. Perinodal infiltration is again identified. The findings appears stable since immediate prior examination but, as noted previously, are new since prior examination of 03/08/2021. Differential considerations include inflammatory aortitis retroperitoneal fibrosis, IgG4 related inflammatory disease, or lymphoma. The abdominal vasculature is unremarkable. Specifically, no aortic aneurysm is identified. Reproductive: Status post hysterectomy. Bilateral adnexal cysts are unchanged since remote prior examination of 08/04/2018. I favor the cystic lesions within the left ovary to represent 2 separate cysts with the largest measuring 2.9 x 3.6 cm. Other: No abdominal wall hernia.  Rectum unremarkable. Musculoskeletal: No acute bone abnormality. IMPRESSION: Stable infiltrative change within the retroperitoneum  circumferentially surrounding the abdominal aorta. See differential considerations above. Stable bilateral adnexal cysts. Given their stability since remote prior examination of 08/04/2018, a benign process is favored. Moderate colonic  stool burden. Stable perivesicular inflammatory stranding, suggesting a superimposed infectious or inflammatory cystitis. The bladder is decompressed. Electronically Signed   By: Fidela Salisbury M.D.   On: 08/11/2021 19:30        Scheduled Meds:  amLODipine  5 mg Oral Daily   cholecalciferol  1,000 Units Oral Daily   enoxaparin (LOVENOX) injection  40 mg Subcutaneous Q24H   estradiol  1.5 mg Oral Daily   gabapentin  300 mg Oral TID   liothyronine  5 mcg Oral Daily   mirabegron ER  25 mg Oral Daily   pantoprazole  40 mg Oral Daily   polyethylene glycol  17 g Oral BID   senna-docusate  1 tablet Oral BID   valACYclovir  1,000 mg Oral Daily   Continuous Infusions:  sodium chloride 50 mL/hr at 08/12/21 2053   cefTRIAXone (ROCEPHIN)  IV 1 g (08/13/21 4739)   metronidazole 500 mg (08/13/21 1353)     LOS: 2 days    Time spent: 35 minutes    Rhegan Trunnell A Marquiz Sotelo, MD [facility]   If 7PM-7AM, please contact night-coverage [email or web address]  08/13/2021, 2:36 PM

## 2021-08-13 NOTE — Discharge Instructions (Signed)
Call Oncologist office [phone]

## 2021-08-13 NOTE — Discharge Summary (Signed)
Physician Discharge Summary  FREDDI FORSTER MOQ:[id number] DOB: 11/13/1960 DOA: 08/10/2021  PCP: Dion Body, MD  Admit date: 08/10/2021 Discharge date: 08/13/2021  Admitted From: Home  Disposition:  Home   Recommendations for Outpatient Follow-up:  Follow up with PCP in 1-2 weeks Please obtain BMP/CBC in one week Follow up with urology for Cystoscopy  Follow up with oncology for further evaluation of retroperitoneal lymphadenopathy     Discharge Condition: Stable.  CODE STATUS: Full code Diet recommendation: Heart Healthy   Brief/Interim Summary: 61 year old with past medical history significant for asthma, depression, anxiety, GERD, urolithiasis who presented to the ER with acute onset of left lower quadrant abdominal pain, associated dysuria and increased frequency.  Patient reported nausea without vomiting.  She reported constipation. CT abdomen and pelvis: Showed generalized retroperitoneal stranding and inflammation most prominent at the level of the lower right kidney to the iliac bifurcation.  Source of inflammation is difficult to delineate.  Differential includes primary retroperitoneal fibrosis, unspecified back for colitis, reactive inflammation related to a primary bowel process although not definitive focal inflamed bowel process seen.  2 adnexal cyst on the left measure 4.9 x 3.2 chronic no significant changes from prior exam 2020.     1-Left lower quadrant abdominal pain: -CT abdomen and pelvis: Show generalized retroperitoneal stranding and inflammation most prominent at the level of the lower right kidney to the iliac bifurcation.  Source of inflammation is difficult to delineate.  Differential includes :  inflammatory aortitis retroperitoneal fibrosis, IgG4 related inflammatory disease, or lymphoma. -GI consulted, recommended bowel regimen, no need for endoscopy procedure.  --Treated  with IV ceftriaxone and flagyl. Discontinue flagyl. Discharge on 2 days of  Keflex.  -Repeat CT scan with oral contrast: Stable infiltrative changes within retroperitoneum, perivesicular inflammatory changes, suggesting cystitis, moderate colonic stool burden.  -Urine culture no growth to date.  -Oncology consulted for retroperitoneal Adenopathy, infiltratives changes. Plan for follow up out patient and discussed case tumor board.  ESR: 35, ANCA panel ordered. IgG 4.  Pain resolve. Home depending on oncology evaluation.   2-Influenza was Ruled out.  Respiratory panel negative. Influenza negative.  Influenza negative, tamiflu discontinue.    3-Hypertension: Continue with amlodipine   Hypothyroidism: Continue with Liothyronine.    Anxiety depression: Continue with Xanax and Lexapro   Peripheral neuropathy: Continue with Neurontin GERD: Continue with PPI Sepsis ruled out  History of hematuria. Cystitis.  Discussed with urologist CT results. He recommend patient to keep follow up for Cystoscopy next week.    Estimated body mass index is 27.59 kg/m as calculated from the following:   Height as of this encounter: 5' 1" (1.549 m).   Weight as of this encounter: 66.2 kg.      Discharge Diagnoses:  Active Problems:   Left lower quadrant abdominal pain    Discharge Instructions  Discharge Instructions     Diet - low sodium heart healthy   Complete by: As directed    Increase activity slowly   Complete by: As directed       Allergies as of 08/13/2021       Reactions   Shellfish Allergy Anaphylaxis, Hives   "Swells throat up"   Peanut-containing Drug Products Hives   Codeine Other (See Comments)   jitters   Diphtheria Toxoid Rash   Tetanus Toxoids Rash        Medication List     TAKE these medications    acetaminophen 500 MG tablet Commonly known as: TYLENOL Take 500 mg  by mouth 2 (two) times daily as needed for headache.   amLODipine 5 MG tablet Commonly known as: NORVASC Take 5 mg by mouth daily.   cephALEXin 500 MG  capsule Commonly known as: KEFLEX Take 1 capsule (500 mg total) by mouth 2 (two) times daily for 2 days.   Cholecalciferol 75 MCG (3000 UT) Tabs Take 3,000 Units by mouth daily.   escitalopram 10 MG tablet Commonly known as: LEXAPRO Take 10 mg by mouth daily as needed (anxiety).   estradiol 1 MG tablet Commonly known as: ESTRACE Take 1.5 mg by mouth daily.   gabapentin 300 MG capsule Commonly known as: NEURONTIN Take 1 capsule (300 mg total) by mouth 3 (three) times daily.   Gemtesa 75 MG Tabs Generic drug: Vibegron Take 75 mg by mouth daily.   liothyronine 5 MCG tablet Commonly known as: CYTOMEL Take 5 mcg by mouth daily.   nortriptyline 10 MG capsule Commonly known as: PAMELOR Take 10 mg by mouth at bedtime as needed for sleep.   Oxycodone HCl 10 MG Tabs Take 1 tablet (10 mg total) by mouth every 8 (eight) hours as needed. Must last 30 days. What changed: Another medication with the same name was removed. Continue taking this medication, and follow the directions you see here.   pantoprazole 40 MG tablet Commonly known as: PROTONIX Take 40 mg by mouth daily.   polyethylene glycol 17 g packet Commonly known as: MIRALAX / GLYCOLAX Take 17 g by mouth 2 (two) times daily.   rizatriptan 10 MG tablet Commonly known as: MAXALT Take 10 mg by mouth as needed for migraine. May repeat in 2 hours if needed   senna-docusate 8.6-50 MG tablet Commonly known as: Senokot-S Take 1 tablet by mouth 2 (two) times daily.   valACYclovir 1000 MG tablet Commonly known as: VALTREX Take 1,000 mg by mouth daily.   VISINE OP Apply 1 drop to eye daily as needed (irritation).        Allergies  Allergen Reactions   Shellfish Allergy Anaphylaxis and Hives    "Swells throat up"   Peanut-Containing Drug Products Hives   Codeine Other (See Comments)    jitters   Diphtheria Toxoid Rash   Tetanus Toxoids Rash    Consultations: Oncology   Procedures/Studies: CT ABDOMEN PELVIS  W CONTRAST  Result Date: 08/11/2021 CLINICAL DATA:  Pyelonephritis, uncomplicated.  Left abdominal pain. EXAM: CT ABDOMEN AND PELVIS WITH CONTRAST TECHNIQUE: Multidetector CT imaging of the abdomen and pelvis was performed using the standard protocol following bolus administration of intravenous contrast. RADIATION DOSE REDUCTION: This exam was performed according to the departmental dose-optimization program which includes automated exposure control, adjustment of the mA and/or kV according to patient size and/or use of iterative reconstruction technique. CONTRAST:  167m OMNIPAQUE IOHEXOL 300 MG/ML  SOLN COMPARISON:  08/10/2021 FINDINGS: Lower chest: Mild bibasilar atelectasis. Cardiac size within normal limits. Small hiatal hernia. Hepatobiliary: No focal liver abnormality is seen. Status post cholecystectomy. Stable mild extrahepatic biliary ductal dilation with the extrahepatic bile duct measuring up to 10 mm within the pancreatic head, possibly representing post cholecystectomy change. No intrahepatic biliary ductal dilation. Pancreas: Unremarkable Spleen: Unremarkable Adrenals/Urinary Tract: The adrenal glands are unremarkable. The kidneys are normal. Bladder is completely decompressed. Mild perivesicular inflammatory stranding persists possibly reflecting a superimposed infectious or inflammatory cystitis. Stomach/Bowel: Moderate stool seen throughout the colon without evidence of obstruction, similar to prior examination. The stomach, small bowel, and large bowel are otherwise unremarkable. Appendix normal. Vascular/Lymphatic: Shotty retroperitoneal  adenopathy is again identified within the left periaortic and aortocaval lymph node groups without pathologic enlargement. Perinodal infiltration is again identified. The findings appears stable since immediate prior examination but, as noted previously, are new since prior examination of 03/08/2021. Differential considerations include inflammatory aortitis  retroperitoneal fibrosis, IgG4 related inflammatory disease, or lymphoma. The abdominal vasculature is unremarkable. Specifically, no aortic aneurysm is identified. Reproductive: Status post hysterectomy. Bilateral adnexal cysts are unchanged since remote prior examination of 08/04/2018. I favor the cystic lesions within the left ovary to represent 2 separate cysts with the largest measuring 2.9 x 3.6 cm. Other: No abdominal wall hernia.  Rectum unremarkable. Musculoskeletal: No acute bone abnormality. IMPRESSION: Stable infiltrative change within the retroperitoneum circumferentially surrounding the abdominal aorta. See differential considerations above. Stable bilateral adnexal cysts. Given their stability since remote prior examination of 08/04/2018, a benign process is favored. Moderate colonic stool burden. Stable perivesicular inflammatory stranding, suggesting a superimposed infectious or inflammatory cystitis. The bladder is decompressed. Electronically Signed   By: Fidela Salisbury M.D.   On: 08/11/2021 19:30   CT Abdomen Pelvis W Contrast  Result Date: 08/10/2021 CLINICAL DATA:  Left lower quadrant abdominal pain.  Nausea. EXAM: CT ABDOMEN AND PELVIS WITH CONTRAST TECHNIQUE: Multidetector CT imaging of the abdomen and pelvis was performed using the standard protocol following bolus administration of intravenous contrast. RADIATION DOSE REDUCTION: This exam was performed according to the departmental dose-optimization program which includes automated exposure control, adjustment of the mA and/or kV according to patient size and/or use of iterative reconstruction technique. CONTRAST:  175m OMNIPAQUE IOHEXOL 300 MG/ML  SOLN COMPARISON:  Noncontrast CT 03/08/2021 FINDINGS: Lower chest: Subsegmental atelectasis in both lower lobes. No pleural effusion. Small hiatal hernia. Hepatobiliary: No focal hepatic abnormality. Cholecystectomy. Proximal common bile duct measures 9 mm, normal for postcholecystectomy  state. No visualized choledocholithiasis. Pancreas: There is retroperitoneal stranding but this not appear centered on the pancreas. No evidence of pancreatic inflammation. No pancreatic ductal dilatation or visualized pancreatic mass. Spleen: Normal in size without focal abnormality. Adrenals/Urinary Tract: Normal adrenal glands. No hydronephrosis or perinephric edema. Homogeneous renal enhancement with symmetric excretion on delayed phase imaging. No visualized renal calculi. No focal renal lesion. Urinary bladder is partially distended without wall thickening. There is mild perivesicular fat stranding. Stomach/Bowel: Small hiatal hernia. Decompressed stomach. Small amount of fluid in the duodenum without definite wall thickening. There is mild retroperitoneal edema, but is not appear centered on the duodenum. Occasional fluid-filled loops of small bowel in the central abdomen. No bowel dilatation or obstruction. Occasional small bowel enhancement in the right lower quadrant bowel loops. No small bowel inflammation. Normal appendix. Moderate colonic stool burden with colonic tortuosity. No significant diverticular disease. Vascular/Lymphatic: There is generalized retroperitoneal stranding most prominent at the level of the lower right kidney to the iliac bifurcation. The abdominal aorta is normal in caliber, mild aortic tortuosity. There is no definite aortic thickening, although patient motion artifact limits detailed assessment. IVC and iliac veins are patent. Portal and splenic veins are patent. There is shotty retroperitoneal and central mesenteric adenopathy. Reproductive: Hysterectomy. Septated versus 2 adnexal cysts on the left measures 4.9 x 3.2 cm, chronic and not significantly changed from prior exam allowing for differences in caliper placement. There may be mild stranding adjacent to the ovary. 1.8 cm fluid density structure in the right pelvis was not seen on prior exams. Other: Generalized  retroperitoneal stranding, with stranding tracking into both pericolic gutters and the pelvis. Source of inflammation is difficult to  delineate. Trace free fluid in the pelvis. No free air. Tiny fat containing umbilical hernia Musculoskeletal: There are no acute or suspicious osseous abnormalities. No evidence of iliopsoas or intramuscular collection. IMPRESSION: 1. Generalized retroperitoneal stranding and inflammation, most prominent at the level of the lower right kidney to the iliac bifurcation. Source of inflammation is difficult to delineate. Differential considerations include primary retroperitoneal fibrosis, unspecified vasculitis, or reactive inflammation related to the primary bowel process, although no definite focal inflamed bowel process is seen as source. Consider short interval follow-up CT to assess for change, consider administration of enteric contrast for better bowel assessment. 2. Septated versus 2 adnexal cysts on the left measures 4.9 x 3.2 cm, chronic and not significantly changed from prior 2020 exam allowing for differences in caliper placement, consistent with benign process. 3. Small hiatal hernia. 4. Moderate colonic stool burden with colonic tortuosity, suggesting constipation. 5. Shotty retroperitoneal and central mesenteric adenopathy is likely reactive. Electronically Signed   By: Keith Rake M.D.   On: 08/10/2021 23:53     Subjective: She is feeling better, abdominal pain resolved.   Discharge Exam: Vitals:   08/13/21 0758 08/13/21 1206  BP: 106/78 109/80  Pulse: 84 88  Resp: 18 16  Temp: 97.9 F (36.6 C) 97.7 F (36.5 C)  SpO2: 96% 97%     General: Pt is alert, awake, not in acute distress Cardiovascular: RRR, S1/S2 +, no rubs, no gallops Respiratory: CTA bilaterally, no wheezing, no rhonchi Abdominal: Soft, NT, ND, bowel sounds + Extremities: no edema, no cyanosis    The results of significant diagnostics from this hospitalization (including  imaging, microbiology, ancillary and laboratory) are listed below for reference.     Microbiology: Recent Results (from the past 240 hour(s))  Urine Culture     Status: None   Collection Time: 08/11/21  3:34 PM   Specimen: Urine, Random  Result Value Ref Range Status   Specimen Description   Final    URINE, RANDOM Performed at [facility], [address], [place], [place] [address]    Special Requests   Final    NONE Performed at [facility], [address] [place], [place] [address]    Culture   Final    NO GROWTH Performed at [facility], [place] [address], [place], [place] [address]    Report Status 08/13/2021 FINAL  Final  Resp Panel by RT-PCR (Flu A&B, Covid) Nasopharyngeal Swab     Status: None   Collection Time: 08/11/21  3:40 PM   Specimen: Nasopharyngeal Swab; Nasopharyngeal(NP) swabs in vial transport medium  Result Value Ref Range Status   SARS Coronavirus 2 by RT PCR NEGATIVE NEGATIVE Final    Comment: (NOTE) SARS-CoV-2 target nucleic acids are NOT DETECTED.  The SARS-CoV-2 RNA is generally detectable in upper respiratory specimens during the acute phase of infection. The lowest concentration of SARS-CoV-2 viral copies this assay can detect is 138 copies/mL. A negative result does not preclude SARS-Cov-2 infection and should not be used as the sole basis for treatment or other patient management decisions. A negative result may occur with  improper specimen collection/handling, submission of specimen other than nasopharyngeal swab, presence of viral mutation(s) within the areas targeted by this assay, and inadequate number of viral copies(<138 copies/mL). A negative result must be combined with clinical observations, patient history, and epidemiological information. The expected result is Negative.  Fact Sheet for Patients:  EntrepreneurPulse.com.au  Fact Sheet for Healthcare Providers:   IncredibleEmployment.be  This test is no t yet approved or cleared by the [place] and  has been authorized for detection and/or diagnosis of SARS-CoV-2 by FDA under an Emergency Use Authorization (EUA). This EUA will remain  in effect (meaning this test can be used) for the duration of the COVID-19 declaration under Section 564(b)(1) of the Act, 21 U.S.C.section 360bbb-3(b)(1), unless the authorization is terminated  or revoked sooner.       Influenza A by PCR NEGATIVE NEGATIVE Final   Influenza B by PCR NEGATIVE NEGATIVE Final    Comment: (NOTE) The Xpert Xpress SARS-CoV-2/FLU/RSV plus assay is intended as an aid in the diagnosis of influenza from Nasopharyngeal swab specimens and should not be used as a sole basis for treatment. Nasal washings and aspirates are unacceptable for Xpert Xpress SARS-CoV-2/FLU/RSV testing.  Fact Sheet for Patients: EntrepreneurPulse.com.au  Fact Sheet for Healthcare Providers: IncredibleEmployment.be  This test is not yet approved or cleared by the [place] FDA and has been authorized for detection and/or diagnosis of SARS-CoV-2 by FDA under an Emergency Use Authorization (EUA). This EUA will remain in effect (meaning this test can be used) for the duration of the COVID-19 declaration under Section 564(b)(1) of the Act, 21 U.S.C. section 360bbb-3(b)(1), unless the authorization is terminated or revoked.  Performed at [facility], [place] [place], [place] [address]      Labs: BNP (last 3 results) No results for input(s): BNP in the last 8760 hours. Basic Metabolic Panel: Recent Labs  Lab 08/10/21 1741 08/11/21 0526 08/12/21 0812  NA 136 138 137  K 3.9 3.8 3.5  CL 102 101 99  CO2 _0 GLUCOSE 113* 106* 88  BUN 14 10 <5*  CREATININE 1.05* 0.93 0.69  CALCIUM 9.1 8.8* 8.3*   Liver Function Tests: Recent Labs  Lab 08/10/21 1741  AST 20   ALT 12  ALKPHOS 58  BILITOT 0.7  PROT 7.2  ALBUMIN 3.3*   Recent Labs  Lab 08/10/21 1741  LIPASE 34   No results for input(s): AMMONIA in the last 168 hours. CBC: Recent Labs  Lab 08/10/21 1741 08/11/21 0526 08/12/21 0812  WBC 10.2 12.2* 6.8  HGB 12.6 11.7* 11.8*  HCT 38.2 35.9* 35.5*  MCV 95.5 96.2 93.7  PLT 303 272 249   Cardiac Enzymes: No results for input(s): CKTOTAL, CKMB, CKMBINDEX, TROPONINI in the last 168 hours. BNP: Invalid input(s): POCBNP CBG: No results for input(s): GLUCAP in the last 168 hours. D-Dimer No results for input(s): DDIMER in the last 72 hours. Hgb A1c No results for input(s): HGBA1C in the last 72 hours. Lipid Profile No results for input(s): CHOL, HDL, LDLCALC, TRIG, CHOLHDL, LDLDIRECT in the last 72 hours. Thyroid function studies No results for input(s): TSH, T4TOTAL, T3FREE, THYROIDAB in the last 72 hours.  Invalid input(s): FREET3 Anemia work up No results for input(s): VITAMINB12, FOLATE, FERRITIN, TIBC, IRON, RETICCTPCT in the last 72 hours. Urinalysis    Component Value Date/Time   COLORURINE YELLOW (A) 08/10/2021 2318   APPEARANCEUR HAZY (A) 08/10/2021 2318   APPEARANCEUR Cloudy (A) 07/20/2021 1340   LABSPEC 1.009 08/10/2021 2318   PHURINE 6.0 08/10/2021 2318   GLUCOSEU NEGATIVE 08/10/2021 2318   HGBUR SMALL (A) 08/10/2021 2318   BILIRUBINUR NEGATIVE 08/10/2021 2318   BILIRUBINUR Negative 07/20/2021 [place] 08/10/2021 2318   PROTEINUR NEGATIVE 08/10/2021 2318   UROBILINOGEN 0.2 06/12/2015 2142   NITRITE NEGATIVE 08/10/2021 2318   LEUKOCYTESUR NEGATIVE 08/10/2021  2318   Sepsis Labs Invalid input(s): PROCALCITONIN,  WBC,  LACTICIDVEN Microbiology Recent Results (from the past 240 hour(s))  Urine Culture     Status: None   Collection Time: 08/11/21  3:34 PM   Specimen: Urine, Random  Result Value Ref Range Status   Specimen Description   Final    URINE, RANDOM Performed at [facility], [address] [place], [place] [address]    Special Requests   Final    NONE Performed at [facility], [address] [place], [place] [address]    Culture   Final    NO GROWTH Performed at [facility], [place] [address], [place], [place] [address]    Report Status 08/13/2021 FINAL  Final  Resp Panel by RT-PCR (Flu A&B, Covid) Nasopharyngeal Swab     Status: None   Collection Time: 08/11/21  3:40 PM   Specimen: Nasopharyngeal Swab; Nasopharyngeal(NP) swabs in vial transport medium  Result Value Ref Range Status   SARS Coronavirus 2 by RT PCR NEGATIVE NEGATIVE Final    Comment: (NOTE) SARS-CoV-2 target nucleic acids are NOT DETECTED.  The SARS-CoV-2 RNA is generally detectable in upper respiratory specimens during the acute phase of infection. The lowest concentration of SARS-CoV-2 viral copies this assay can detect is 138 copies/mL. A negative result does not preclude SARS-Cov-2 infection and should not be used as the sole basis for treatment or other patient management decisions. A negative result may occur with  improper specimen collection/handling, submission of specimen other than nasopharyngeal swab, presence of viral mutation(s) within the areas targeted by this assay, and inadequate number of viral copies(<138 copies/mL). A negative result must be combined with clinical observations, patient history, and epidemiological information. The expected result is Negative.  Fact Sheet for Patients:  EntrepreneurPulse.com.au  Fact Sheet for Healthcare Providers:  IncredibleEmployment.be  This test is no t yet approved or cleared by the [place] FDA and  has been authorized for detection and/or diagnosis of SARS-CoV-2 by FDA under an Emergency Use Authorization (EUA). This EUA will remain  in effect (meaning this test can be used) for the duration of the COVID-19 declaration under Section 564(b)(1) of the Act,  21 U.S.C.section 360bbb-3(b)(1), unless the authorization is terminated  or revoked sooner.       Influenza A by PCR NEGATIVE NEGATIVE Final   Influenza B by PCR NEGATIVE NEGATIVE Final    Comment: (NOTE) The Xpert Xpress SARS-CoV-2/FLU/RSV plus assay is intended as an aid in the diagnosis of influenza from Nasopharyngeal swab specimens and should not be used as a sole basis for treatment. Nasal washings and aspirates are unacceptable for Xpert Xpress SARS-CoV-2/FLU/RSV testing.  Fact Sheet for Patients: EntrepreneurPulse.com.au  Fact Sheet for Healthcare Providers: IncredibleEmployment.be  This test is not yet approved or cleared by the [place] FDA and has been authorized for detection and/or diagnosis of SARS-CoV-2 by FDA under an Emergency Use Authorization (EUA). This EUA will remain in effect (meaning this test can be used) for the duration of the COVID-19 declaration under Section 564(b)(1) of the Act, 21 U.S.C. section 360bbb-3(b)(1), unless the authorization is terminated or revoked.  Performed at [facility] [address] [place], [place] [address]      Time coordinating discharge: 40 minutes  SIGNED:   Elmarie Shiley, MD  [facility]

## 2021-08-13 NOTE — Progress Notes (Signed)
Discharge instructions given and reviewed with patient. Will transport to [place] to await transportation. Orma Flaming, RN

## 2021-08-14 LAB — ANA W/REFLEX IF POSITIVE: Anti Nuclear Antibody (ANA): NEGATIVE

## 2021-08-15 LAB — ANCA TITERS
Atypical P-ANCA titer: <1:20 {titer}
C-ANCA: <1:20 {titer}
P-ANCA: <1:20 {titer}

## 2021-08-16 ENCOUNTER — Telehealth: Payer: Self-pay | Admitting: *Deleted

## 2021-08-16 NOTE — Telephone Encounter (Signed)
CAll returned to patient and advised that Dr B will call her after conference tomorrow. She is in agreement with this plan

## 2021-08-16 NOTE — Telephone Encounter (Signed)
Patient called asking what her next steps for treatment are to be stating Dr B was to speak with another doctor and get back to her. Please advise.  I day see that she is down for conference this week.

## 2021-08-17 ENCOUNTER — Inpatient Hospital Stay: Payer: Commercial Managed Care - HMO | Attending: Internal Medicine

## 2021-08-17 LAB — IGG 4: IgG, Subclass 4: 45 mg/dL (ref 2–96)

## 2021-08-17 NOTE — Progress Notes (Signed)
Tumor Board Documentation  NOELLE SEASE was presented by Dr Rogue Bussing at our Tumor Board on 08/17/2021, which included representatives from pathology, medical oncology, radiology, surgical, pharmacy, pulmonology, genetics, nutrition, navigation, research, internal medicine, palliative care.  Sonji currently presents as a new patient, for discussion with history of the following treatments: active survellience.  Additionally, we reviewed previous medical and familial history, history of present illness, and recent lab results along with all available histopathologic and imaging studies. The tumor board considered available treatment options and made the following recommendations: Active surveillance (Repeat CT in 4 months, Follow Sed Rate)    The following procedures/referrals were also placed: No orders of the defined types were placed in this encounter.   Clinical Trial Status: not discussed   Staging used: Not Applicable AJCC Staging:       Group: Retroperitoneal Adenopethy   National site-specific guidelines   were discussed with respect to the case.  Tumor board is a meeting of clinicians from various specialty areas who evaluate and discuss patients for whom a multidisciplinary approach is being considered. Final determinations in the plan of care are those of the provider(s). The responsibility for follow up of recommendations given during tumor board is that of the provider.   Todays extended care, comprehensive team conference, Betsabe was not present for the discussion and was not examined.   Multidisciplinary Tumor Board is a multidisciplinary case peer review process.  Decisions discussed in the Multidisciplinary Tumor Board reflect the opinions of the specialists present at the conference without having examined the patient.  Ultimately, treatment and diagnostic decisions rest with the primary provider(s) and the patient.

## 2021-08-18 ENCOUNTER — Encounter: Payer: Self-pay | Admitting: Internal Medicine

## 2021-08-18 ENCOUNTER — Other Ambulatory Visit: Payer: Self-pay

## 2021-08-18 ENCOUNTER — Other Ambulatory Visit: Payer: Self-pay | Admitting: Internal Medicine

## 2021-08-18 ENCOUNTER — Other Ambulatory Visit: Payer: Medicare Other | Admitting: Urology

## 2021-08-18 DIAGNOSIS — R599 Enlarged lymph nodes, unspecified: Secondary | ICD-10-CM

## 2021-08-18 NOTE — Progress Notes (Signed)
Spoke to patient regarding our discussion at the Tumor conference. Recommend reevaluation with a CT scan in three months.

## 2021-08-18 NOTE — Progress Notes (Signed)
Lab orders entered

## 2021-08-18 NOTE — Progress Notes (Signed)
Please schedule CT scan in 3 months  Follow-up-in 3 months MD; labs CBC CMP; CT scan prior.  GB

## 2021-08-23 ENCOUNTER — Telehealth: Payer: Self-pay | Admitting: Student in an Organized Health Care Education/Training Program

## 2021-08-23 ENCOUNTER — Other Ambulatory Visit: Payer: Self-pay | Admitting: *Deleted

## 2021-08-23 NOTE — Telephone Encounter (Signed)
Bubble sent to Dr. Holley Raring.

## 2021-08-23 NOTE — Telephone Encounter (Signed)
This has been taken care of.

## 2021-08-24 ENCOUNTER — Encounter: Payer: Commercial Managed Care - HMO | Admitting: Student in an Organized Health Care Education/Training Program

## 2021-08-25 ENCOUNTER — Other Ambulatory Visit: Payer: Self-pay

## 2021-08-25 ENCOUNTER — Ambulatory Visit
Admission: RE | Admit: 2021-08-25 | Discharge: 2021-08-25 | Disposition: A | Discharge status: Home / Self Care | Payer: Medicare Other | Source: Ambulatory Visit | Attending: Urology | Admitting: Urology

## 2021-08-25 DIAGNOSIS — R3129 Other microscopic hematuria: Secondary | ICD-10-CM | POA: Diagnosis not present

## 2021-08-25 MED ORDER — IOHEXOL 350 MG/ML SOLN
85.0000 mL | Freq: Once | INTRAVENOUS | Status: AC | PRN
  Administered 2021-08-25: 85 mL via INTRAVENOUS

## 2021-08-26 ENCOUNTER — Other Ambulatory Visit: Payer: Self-pay

## 2021-08-26 ENCOUNTER — Emergency Department
Admission: EM | Admit: 2021-08-26 | Discharge: 2021-08-26 | Disposition: A | Discharge status: Home / Self Care | Payer: Medicare Other | Attending: Emergency Medicine | Admitting: Emergency Medicine

## 2021-08-26 ENCOUNTER — Encounter: Payer: Self-pay | Admitting: Emergency Medicine

## 2021-08-26 DIAGNOSIS — R102 Pelvic and perineal pain: Secondary | ICD-10-CM | POA: Diagnosis present

## 2021-08-26 DIAGNOSIS — N3090 Cystitis, unspecified without hematuria: Secondary | ICD-10-CM

## 2021-08-26 DIAGNOSIS — R110 Nausea: Secondary | ICD-10-CM | POA: Diagnosis not present

## 2021-08-26 LAB — CBC
HCT: 37.5 % (ref 36.0–46.0)
Hemoglobin: 12.1 g/dL (ref 12.0–15.0)
MCH: 31.8 pg (ref 26.0–34.0)
MCHC: 32.3 g/dL (ref 30.0–36.0)
MCV: 98.4 fL (ref 80.0–100.0)
Platelets: 271 10*3/uL (ref 150–400)
RBC: 3.81 MIL/uL — ABNORMAL LOW (ref 3.87–5.11)
RDW: 12.6 % (ref 11.5–15.5)
WBC: 8.3 10*3/uL (ref 4.0–10.5)
nRBC: 0 % (ref 0.0–0.2)

## 2021-08-26 LAB — URINALYSIS, COMPLETE (UACMP) WITH MICROSCOPIC
Bilirubin Urine: NEGATIVE
Glucose, UA: NEGATIVE mg/dL
Leukocytes,Ua: NEGATIVE
Nitrite: NEGATIVE
Specific Gravity, Urine: 1.015 (ref 1.005–1.030)
pH: 5.5 (ref 5.0–8.0)

## 2021-08-26 LAB — BASIC METABOLIC PANEL
Anion gap: 8 (ref 5–15)
BUN: 11 mg/dL (ref 6–20)
CO2: 29 mmol/L (ref 22–32)
Calcium: 9.6 mg/dL (ref 8.9–10.3)
Chloride: 99 mmol/L (ref 98–111)
Creatinine, Ser: 0.9 mg/dL (ref 0.44–1.00)
GFR, Estimated: >60 mL/min (ref >60)
Glucose, Bld: 119 mg/dL — ABNORMAL HIGH (ref 70–99)
Potassium: 3.5 mmol/L (ref 3.5–5.1)
Sodium: 136 mmol/L (ref 135–145)

## 2021-08-26 IMAGING — RF DG SWALLOWING FUNCTION
12 series · 13 of 24 positions shown · non-contrast
Comparison: None.

CLINICAL DATA: Dysphagia.

EXAM:
MODIFIED BARIUM SWALLOW
TECHNIQUE: Different consistencies of barium were administered orally to the
patient by the Speech Pathologist. Imaging of the pharynx was
performed in the lateral projection. The radiologist was present in
the fluoroscopy room for this study, providing personal supervision.
FLUOROSCOPY TIME:  Fluoroscopy Time:  0.8 minutes
Radiation Exposure Index (if provided by the fluoroscopic device):
1.2 mGy
Number of Acquired Spot Images: 0

[Series 2: run · 1 of 5 frames shown (1 of 12)]
[frame 1/5]
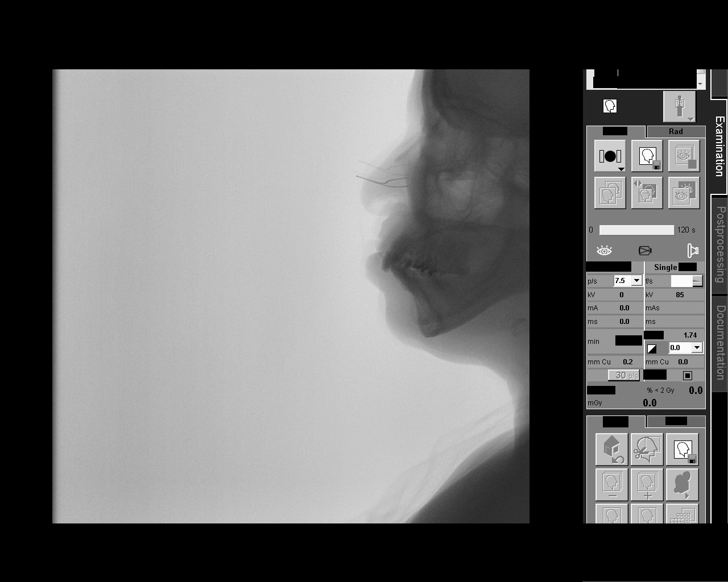

[Series 3: run · 1 of 93 frames shown (2 of 12)]
[frame 14/93]
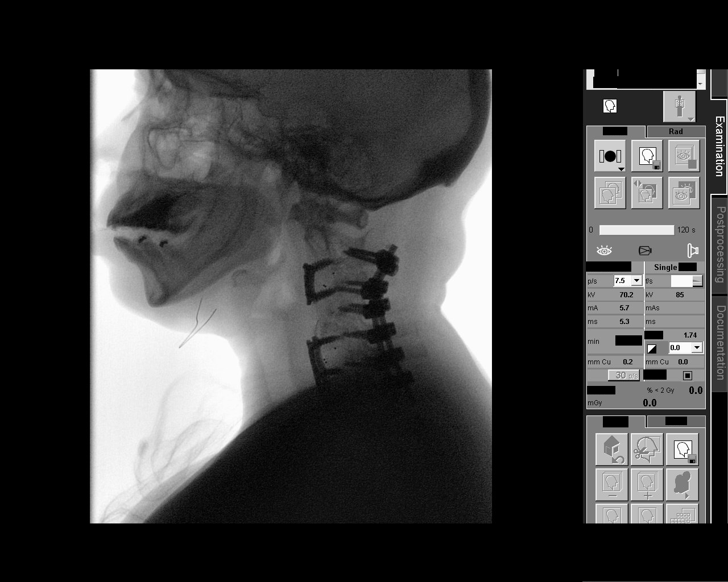

[Series 4: run · 1 of 94 frames shown (3 of 12)]
[frame 15/94]
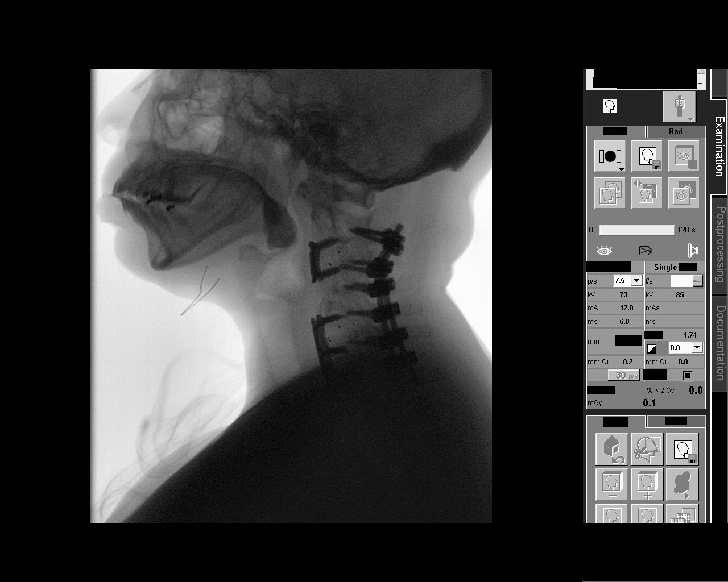

[Series 5: run · 1 of 26 frames shown (4 of 12)]
[frame 4/26]
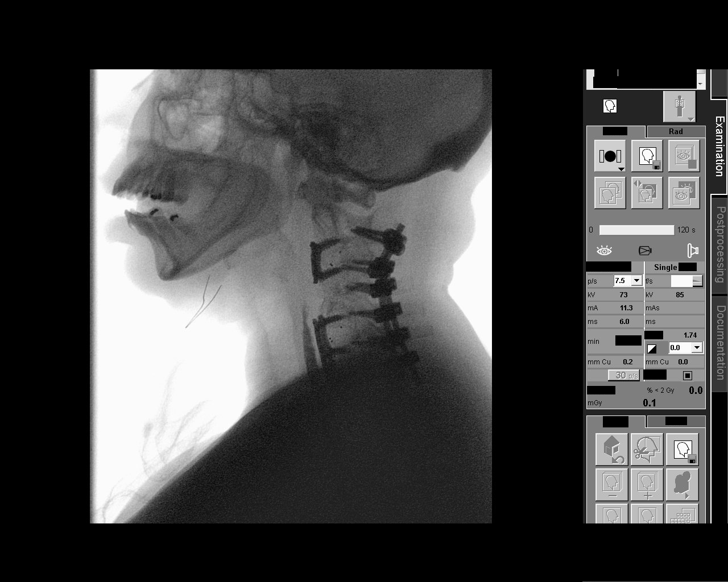

[Series 6: run · 1 of 382 frames shown (5 of 12)]
[frame 58/382]
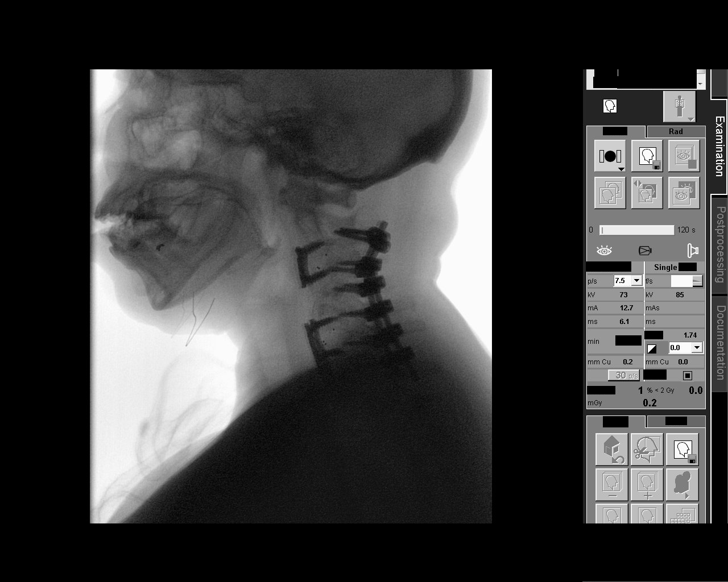

[Series 7: run · 1 of 207 frames shown (6 of 12)]
[frame 104/207]
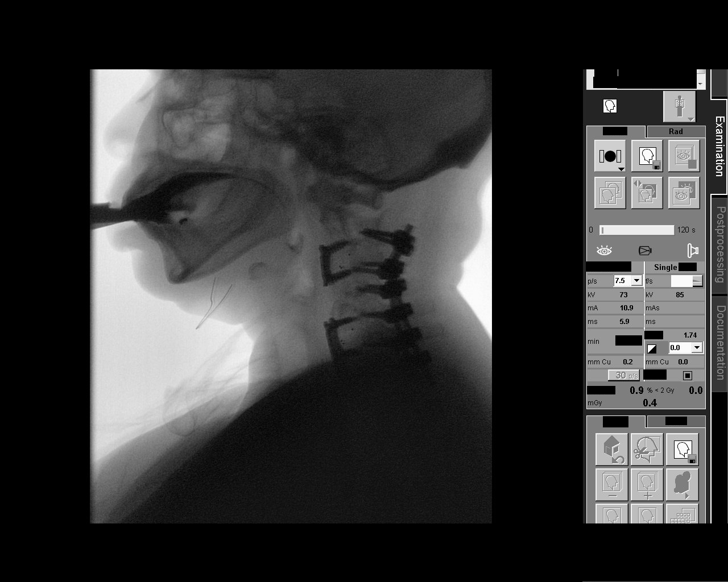

[Series 8: run · 2 of 186 frames shown (7 of 12)]
[frame 94/186]
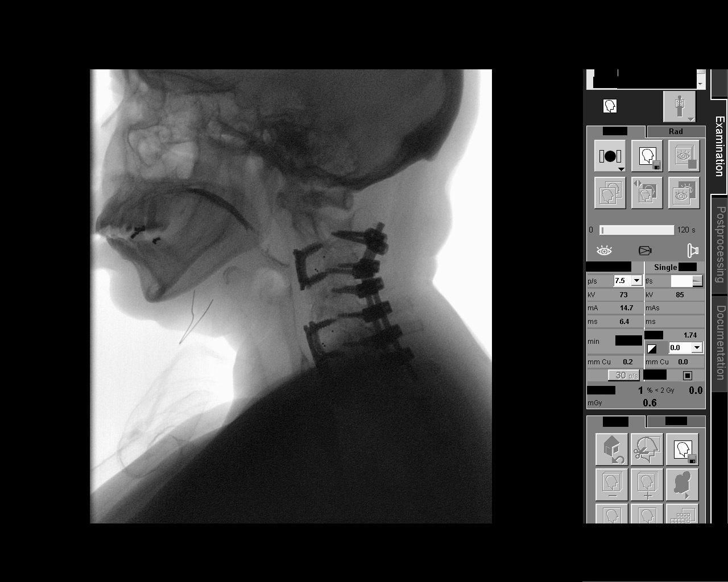
[frame 159/186]
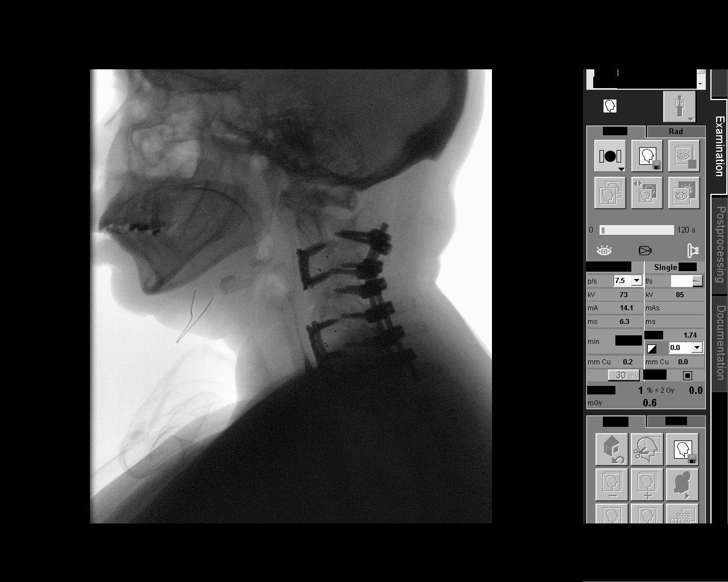

[Series 9: run · 1 of 224 frames shown (8 of 12)]
[frame 192/224]
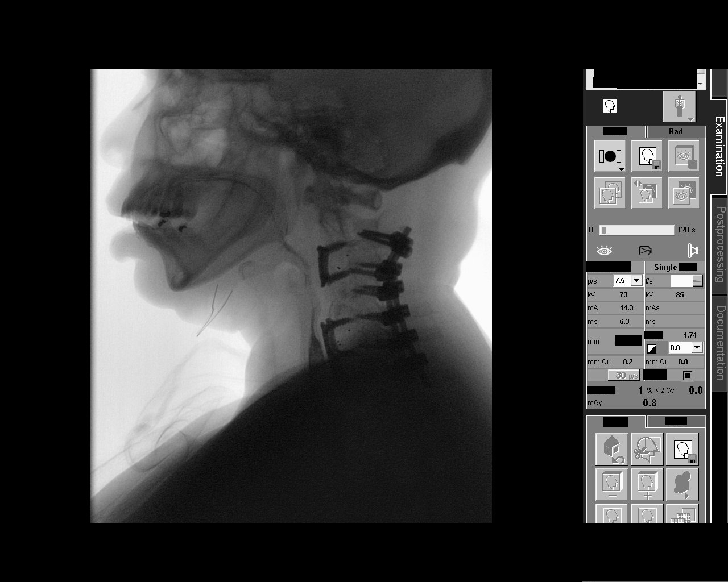

[Series 10: run · 1 of 174 frames shown (9 of 12)]
[frame 148/174]
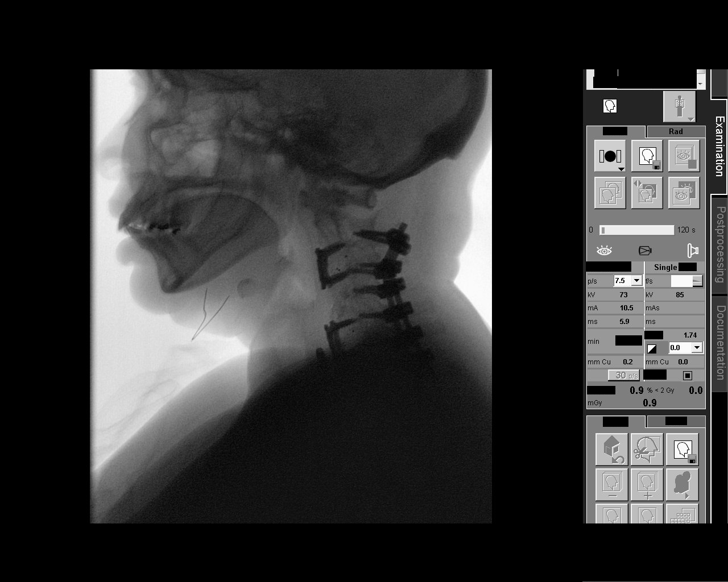

[Series 11: run · 1 of 20 frames shown (10 of 12)]
[frame 19/20]
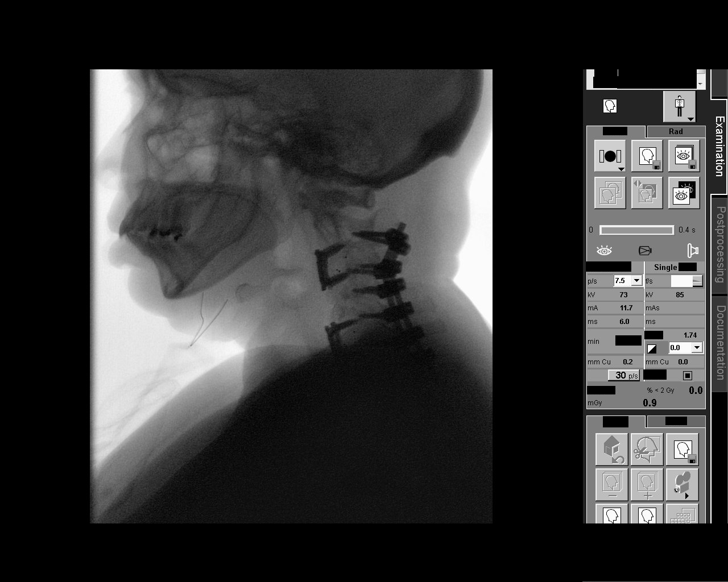

[Series 12: run · 1 of 29 frames shown (11 of 12)]
[frame 25/29]
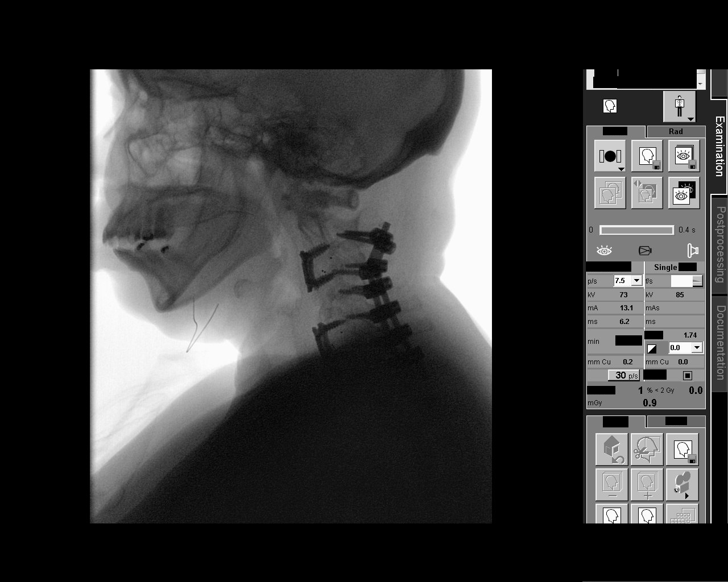

[Series 13: run · 1 of 54 frames shown (12 of 12)]
[frame 46/54]
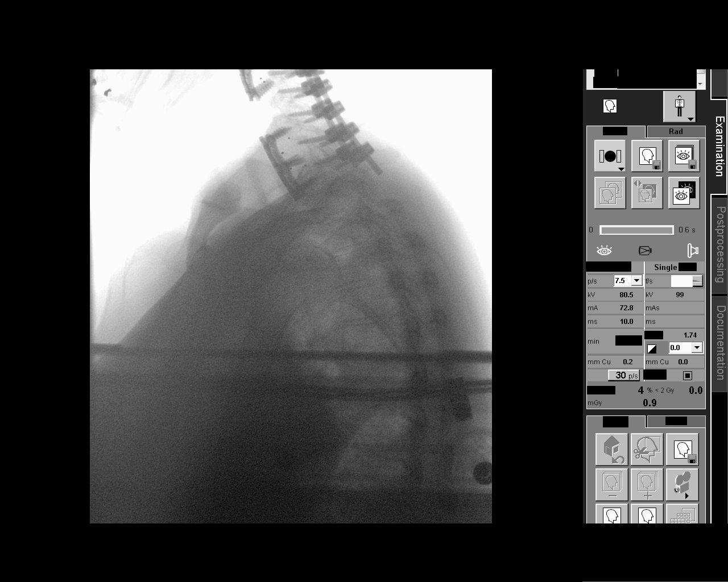

[13 of 24 positions shown; findings below may reference images not displayed]

FINDINGS: Real-time fluoroscopy was utilized for evaluation of the swallow
function with a speech pathologist present. Multiple consistencies
of barium were administered to the patient. Consistencies included
thin, nectar, applesauce, cracker and barium tablet. Transient delay
in passage of the barium tablet through the upper esophagus. After
the transient delay, the barium tablet transit through the esophagus
into the stomach without delay. Otherwise no focal abnormality. No
laryngeal penetration or tracheal aspiration.

Incidental note made of anterior and posterior spinal fusion
hardware.
IMPRESSION: Modified barium swallow as described above.

Please refer to the Speech Pathologists report for complete details
and recommendations.

## 2021-08-26 IMAGING — RF DG SWALLOWING FUNCTION
1 series · 1 of 1 positions shown · non-contrast
Comparison: None.

CLINICAL DATA: Dysphagia.

EXAM:
MODIFIED BARIUM SWALLOW
TECHNIQUE: Different consistencies of barium were administered orally to the
patient by the Speech Pathologist. Imaging of the pharynx was
performed in the lateral projection. The radiologist was present in
the fluoroscopy room for this study, providing personal supervision.
FLUOROSCOPY TIME:  Fluoroscopy Time:  0.8 minutes
Radiation Exposure Index (if provided by the fluoroscopic device):
1.2 mGy
Number of Acquired Spot Images: 0

[Series 1: cp_standard · 0.25mm/px · 1 of 1 slices shown]
[im 1/1]
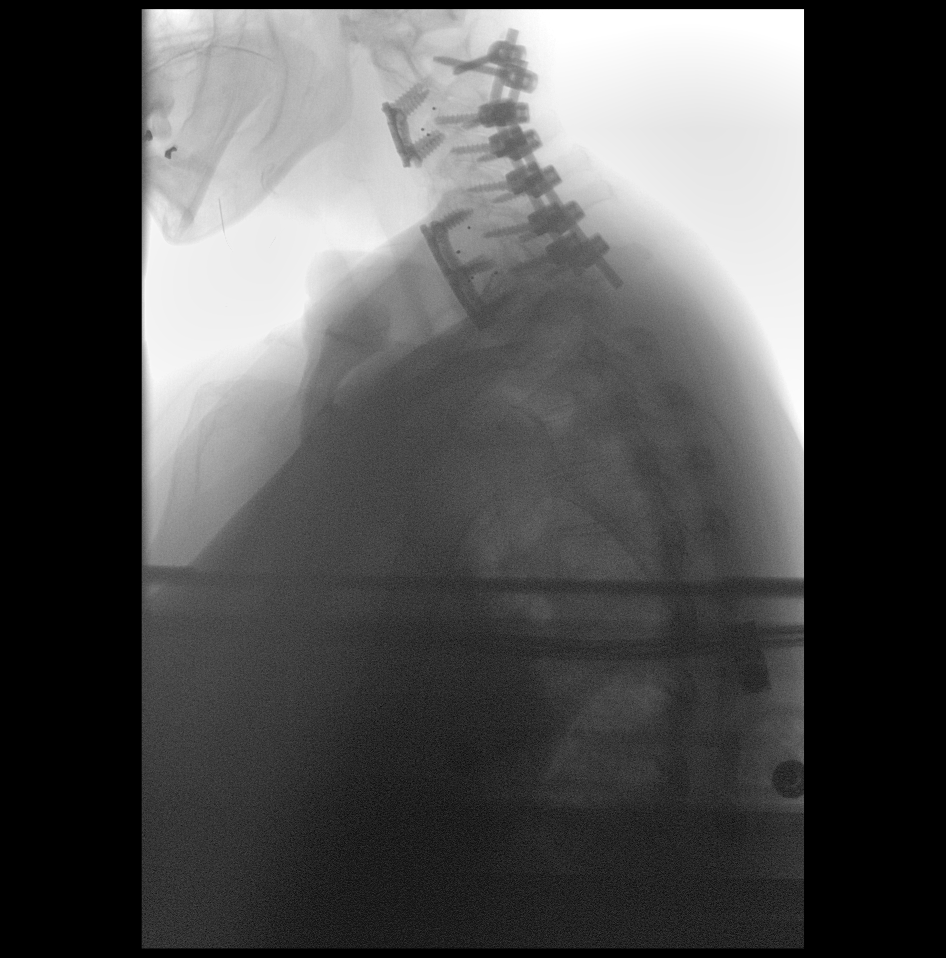

[1 of 1 positions shown; findings below may reference images not displayed]

FINDINGS: Real-time fluoroscopy was utilized for evaluation of the swallow
function with a speech pathologist present. Multiple consistencies
of barium were administered to the patient. Consistencies included
thin, nectar, applesauce, cracker and barium tablet. Transient delay
in passage of the barium tablet through the upper esophagus. After
the transient delay, the barium tablet transit through the esophagus
into the stomach without delay. Otherwise no focal abnormality. No
laryngeal penetration or tracheal aspiration.

Incidental note made of anterior and posterior spinal fusion
hardware.
IMPRESSION: Modified barium swallow as described above.

Please refer to the Speech Pathologists report for complete details
and recommendations.

## 2021-08-26 MED ORDER — CEPHALEXIN 500 MG PO CAPS: 500.0000 mg | ORAL_CAPSULE | Freq: Three times a day (TID) | ORAL | 0 refills | Status: AC

## 2021-08-26 MED ORDER — CEPHALEXIN 500 MG PO CAPS
500.0000 mg | ORAL_CAPSULE | Freq: Once | ORAL | Status: AC
  Administered 2021-08-26: 500 mg via ORAL
  Filled 2021-08-26: qty 1

## 2021-08-26 NOTE — ED Provider Notes (Signed)
[facility] Provider Note  Patient Contact: 5:43 PM (approximate)   History   Vaginal Pain   HPI  Kimberly [name] is a 61 y.o. female presents to the ED for evaluation of pelvic pressure, dysuria, and hematuria.  Patient with symptoms for the last 4 to 6 weeks.  She has been evaluated by her urologist and had a CT renal stone study done about 2 weeks ago.  She scheduled to see the urologist next month.  Since that time she has noted an increase to the pain to the lower pelvic region which prompted her to report to the ED today.  She reports some associated nausea but denies any vomiting, fevers, chest pain, or shortness of breath.     Physical Exam   Triage Vital Signs: ED Triage Vitals  Enc Vitals Group     BP 08/26/21 1615 (!) 117/93     Pulse Rate 08/26/21 1615 89     Resp 08/26/21 1615 16     Temp 08/26/21 1615 98.8 F (37.1 C)     Temp Source 08/26/21 1615 Oral     SpO2 08/26/21 1615 94 %     Weight 08/26/21 1613 139 lb (63 kg)     Height 08/26/21 1613 5\' 1"  (1.549 m)     Head Circumference --      Peak Flow --      Pain Score 08/26/21 1613 8     Pain Loc --      Pain Edu? --      Excl. in [place]? --     Most recent vital signs: Vitals:   08/26/21 1615 08/26/21 1855  BP: (!) 117/93 126/87  Pulse: 89 86  Resp: 16 16  Temp: 98.8 F (37.1 C)   SpO2: 94% 94%     General: Alert and in no acute distress. Cardiovascular:  Good peripheral perfusion Respiratory: Normal respiratory effort without tachypnea or retractions. Lungs CTAB.  Gastrointestinal: Bowel sounds 4 quadrants. Soft and nontender to palpation. No guarding or rigidity. No palpable masses. No distention. No CVA tenderness. GU: deferred Musculoskeletal: Full range of motion to all extremities.  Neurologic:  No gross focal neurologic deficits are appreciated.  Skin:   No rash noted Other:   ED Results / Procedures / Treatments   Labs (all labs ordered are listed, but only  abnormal results are displayed) Labs Reviewed  CBC - Abnormal; Notable for the following components:      Result Value   RBC 3.81 (*)    All other components within normal limits  BASIC METABOLIC PANEL - Abnormal; Notable for the following components:   Glucose, Bld 119 (*)    All other components within normal limits  URINALYSIS, COMPLETE (UACMP) WITH MICROSCOPIC - Abnormal; Notable for the following components:   APPearance CLOUDY (*)    Hgb urine dipstick MODERATE (*)    Ketones, ur TRACE (*)    Protein, ur TRACE (*)    Bacteria, UA MANY (*)    All other components within normal limits  URINE CULTURE     EKG   RADIOLOGY  I personally viewed and evaluated these images as part of my medical decision making, as well as reviewing the written report by the radiologist.  ED Provider Interpretation: no hydronephrosis, renal calculi  CT ABD/PELVIS w/ CM (08/11/21)  IMPRESSION: Stable infiltrative change within the retroperitoneum circumferentially surrounding the abdominal aorta. See differential considerations above.   Stable bilateral adnexal cysts. Given their stability  since remote prior examination of 08/04/2018, a benign process is favored.   Moderate colonic stool burden.   Stable perivesicular inflammatory stranding, suggesting a superimposed infectious or inflammatory cystitis. The bladder is decompressed.  PROCEDURES:  Critical Care performed: No  Procedures   MEDICATIONS ORDERED IN ED: Medications  cephALEXin (KEFLEX) capsule 500 mg (500 mg Oral Given 08/26/21 1855)     IMPRESSION / MDM / ASSESSMENT AND PLAN / ED COURSE  I reviewed the triage vital signs and the nursing notes.                              Differential diagnosis includes, but is not limited to, pyelonephritis, cystitis, renal calculi  Patient ED evaluation of increasing vulvar discomfort, dysuria, and hematuria.  Patient been evaluated by urologist and had a CT done 2 weeks ago.  I  reviewed the images and the report, which shows some cystitis of a inflammatory versus infectious etiology.  She presented to the ED today due to an increase in her acute pain.  She was evaluated for complaints, and found to have a UA that does confirm some leukocyturia, hematuria, and a dysuria.  No electrolyte abnormalities on BMP, and no acute leukocytosis or significant anemia on CBC.  Patient's diagnosis is consistent with hemorrhagic cystitis. Patient will be discharged home with prescriptions for Keflex. Patient is to follow up with urology or PCP as needed or otherwise directed. Patient is given ED precautions to return to the ED for any worsening or new symptoms.   FINAL CLINICAL IMPRESSION(S) / ED DIAGNOSES   Final diagnoses:  Cystitis     Rx / DC Orders   ED Discharge Orders          Ordered    cephALEXin (KEFLEX) 500 MG capsule  3 times daily        08/26/21 1825             Note:  This document was prepared using Dragon voice recognition software and may include unintentional dictation errors.    Melvenia Needles, PA-C 08/26/21 1906    Carrie Mew, MD 08/26/21 [phone]

## 2021-08-26 NOTE — ED Notes (Signed)
First encounter with pt prior to discharge. Pt verbalized understanding of discharge instructions, prescription medications, and follow-up care instructions. Pt advised if symptoms worsen to return to ED.

## 2021-08-26 NOTE — Discharge Instructions (Addendum)
You are being treated for cystitis.  Take the antibiotic as prescribed.  Follow-up with urology for ongoing symptoms, as scheduled.  Return to the ED if needed.

## 2021-08-26 NOTE — ED Triage Notes (Signed)
Pt via POV from home. Pt c/o vaginal pain for about a month. Pt states that the pain is becoming more severe and she also having nausea. Pt had a CT done recently. States she having burning when she pees and blood in her urine. Pt is A&OX4 and NAD

## 2021-08-28 LAB — URINE CULTURE

## 2021-08-30 ENCOUNTER — Telehealth: Payer: Self-pay | Admitting: Urology

## 2021-08-30 NOTE — Telephone Encounter (Signed)
Patient was seen in the ED on Saturday 08/26/21 for Cystitis and was prescribed Keflex.  She stated that she is still in a lot of pain.  She has a Cysto appt scheduled for 08/04/21 but stated that she doesn't think she can wait that long to be seen.

## 2021-08-30 NOTE — Telephone Encounter (Signed)
Her urine culture grew multiple organisms and voided urine had significant vaginal contamination.  Recommend nurse visit for catheterized urine for UA and repeat culture

## 2021-08-31 ENCOUNTER — Other Ambulatory Visit: Payer: Self-pay

## 2021-08-31 ENCOUNTER — Ambulatory Visit: Payer: Medicare Other

## 2021-08-31 DIAGNOSIS — R350 Frequency of micturition: Secondary | ICD-10-CM

## 2021-08-31 LAB — URINALYSIS, COMPLETE
Bilirubin, UA: NEGATIVE
Glucose, UA: NEGATIVE
Ketones, UA: NEGATIVE
Leukocytes,UA: NEGATIVE
Nitrite, UA: NEGATIVE
Protein,UA: NEGATIVE
Specific Gravity, UA: 1.02 (ref 1.005–1.030)
Urobilinogen, Ur: 0.2 mg/dL (ref 0.2–1.0)
pH, UA: 6.5 (ref 5.0–7.5)

## 2021-08-31 LAB — MICROSCOPIC EXAMINATION

## 2021-08-31 NOTE — H&P (View-Only) (Signed)
In and Out Catheterization  Patient is present today for a I & O catheterization due to UTI. Patient was cleaned and prepped in a sterile fashion with betadine . A 14FR cath was inserted no complications were noted , 50 ml of urine return was noted, urine was yellow in color. A clean urine sample was collected for urinalysis and urine culture. Bladder was drained  And catheter was removed with out difficulty.    Performed by: Kerman Passey, RMA  Follow up/ Additional notes: as directed.

## 2021-08-31 NOTE — Progress Notes (Signed)
In and Out Catheterization  Patient is present today for a I & O catheterization due to UTI. Patient was cleaned and prepped in a sterile fashion with betadine . A 14FR cath was inserted no complications were noted , 50 ml of urine return was noted, urine was yellow in color. A clean urine sample was collected for urinalysis and urine culture. Bladder was drained  And catheter was removed with out difficulty.    Performed by: Kerman Passey, RMA  Follow up/ Additional notes: as directed.

## 2021-09-03 LAB — CULTURE, URINE COMPREHENSIVE

## 2021-09-04 ENCOUNTER — Encounter: Payer: Self-pay | Admitting: Urology

## 2021-09-04 ENCOUNTER — Other Ambulatory Visit: Payer: Self-pay

## 2021-09-04 ENCOUNTER — Ambulatory Visit (INDEPENDENT_AMBULATORY_CARE_PROVIDER_SITE_OTHER): Payer: Medicare Other | Admitting: Urology

## 2021-09-04 VITALS — BP 104/71 | HR 108 | Ht 61.0 in | Wt 139.0 lb

## 2021-09-04 DIAGNOSIS — R102 Pelvic and perineal pain: Secondary | ICD-10-CM

## 2021-09-04 DIAGNOSIS — R3129 Other microscopic hematuria: Secondary | ICD-10-CM | POA: Diagnosis not present

## 2021-09-04 NOTE — Progress Notes (Signed)
09/04/2021 9:53 PM   Kimberly [name] 04-Jan-1961 [id number]  Referring provider: Dion Body, MD [facility] [place],  [place] [address]  Chief Complaint  Patient presents with   Other    HPI: 61 y.o. female presents for follow-up.  Prior history nephrolithiasis status post SWL 08/2018 Seen December 2022 with lower abdominal cramping and brownish urine.  UA with 4-10 RBCs Intermittent storage related voiding symptoms Saw [facility] 07/20/2021 and UA showing persistent microhematuria CTU 08/25/2021 showed an adnexal cyst that was stable from 2020, punctate nonobstructing left renal calculus.  Limited opacification of the right distal and proximal left ureter For the past month complains of fairly significant pelvic pain which is worse with any movement, pressure and after a bowel movement Also with frequency, urgency and urge incontinence She has been treated by for UTI however UA showed 21-50 epis Called last week complaining of worsening symptoms despite antibiotics and was asked to come in for a catheterized specimen which only showed 3-10 RBCs and a negative urine culture Was briefly hospitalized mid January due to lower abdominal pain.  CT showed generalized retroperitoneal stranding and evidence of inflammation most prominent at the right lower pole to the iliac bifurcation Oncology was consulted for evaluation of retroperitoneal adenopathy and follow-up schedule  PMH: Past Medical History:  Diagnosis Date   Anxiety    takes alprazolam - rare use    Arthritis    cervical spondylosis    Asthma    Balance problem    Brain cyst    Cervical fusion syndrome    Complete rupture of rotator cuff    Depression    Difficult intubation    Small mouth opening, limited neck flexion, very anterior    Dyspnea    Flu 08/10/2018   tested positive   GERD (gastroesophageal reflux disease)    pt. reports that its better, no meds in use at  this time- 2015   Headache    Herpes    History of kidney stones    History of pneumonia    Hypertension    pt. doesn't see cardiologist, followed for HTN by Dr. Gerarda Fraction   Inflammation of shoulder joint    Memory difficulties    Neuromuscular disorder ([place])    joint and muscle problems   Occipital neuralgia    Pneumothorax on right    following C4-6 ACDF 07/04/16   PONV (postoperative nausea and vomiting)    WITH SURGERY 07/04/16 (NECK) RT LUNG COLLAPSED   Pseudoarthrosis of cervical spine (HCC)    Recurrent falls    Syncope    Urinary frequency     Surgical History: Past Surgical History:  Procedure Laterality Date   ABDOMINAL HYSTERECTOMY     ANTERIOR CERVICAL DECOMP/DISCECTOMY FUSION N/A 08/06/2014   Procedure: ANTERIOR CERVICAL DECOMPRESSION/DISCECTOMY FUSION CERVICAL THREE-FOUR,CERVICAL SIX-SEVEN ,CERVICAL SEVEN-THORACIC ONE;  Surgeon: Floyce Stakes, MD;  Location: [facility];  Service: Neurosurgery;  Laterality: N/A;   ANTERIOR CERVICAL DECOMP/DISCECTOMY FUSION N/A 07/04/2016   Procedure: CERVICAL FOUR-FIVE, CERVICAL FIVE-SIX ANTERIOR CERVICAL DECOMPRESSION/DISCECTOMY/FUSION WITH REVISION OF CERVICAL THREE-FOUR PLATE;  Surgeon: Leeroy Cha, MD;  Location: [facility];  Service: Neurosurgery;  Laterality: N/A;   CARPAL TUNNEL RELEASE Left 02/16/2013   Procedure: CARPAL TUNNEL RELEASE;  Surgeon: Carole Civil, MD;  Location: [facility];  Service: Orthopedics;  Laterality: Left;   CARPAL TUNNEL RELEASE Right 03/27/2013   Procedure: RIGHT CARPAL TUNNEL RELEASE;  Surgeon: Carole Civil, MD;  Location: [facility];  Service: Orthopedics;  Laterality: Right;   CESAREAN SECTION     x2   CHOLECYSTECTOMY N/A 06/17/2015   Procedure: LAPAROSCOPIC CHOLECYSTECTOMY;  Surgeon: Aviva Signs, MD;  Location: [facility];  Service: General;  Laterality: N/A;   COLONOSCOPY     COLONOSCOPY WITH PROPOFOL N/A 05/06/2019   Procedure: COLONOSCOPY WITH PROPOFOL;  Surgeon: Robert Bellow, MD;  Location: [facility]  ENDOSCOPY;  Service: Endoscopy;  Laterality: N/A;   ESOPHAGOGASTRODUODENOSCOPY N/A 05/08/2021   Procedure: ESOPHAGOGASTRODUODENOSCOPY (EGD);  Surgeon: Lesly Rubenstein, MD;  Location: [facility];  Service: Endoscopy;  Laterality: N/A;   EXTRACORPOREAL SHOCK WAVE LITHOTRIPSY Left 09/04/2018   Procedure: EXTRACORPOREAL SHOCK WAVE LITHOTRIPSY (ESWL);  Surgeon: Billey Co, MD;  Location: [facility];  Service: Urology;  Laterality: Left;   FOREIGN BODY REMOVAL Left    knee-as child   KNEE ARTHROSCOPY     NASAL SINUS SURGERY N/A 04/13/2015   Procedure: nasal endoscopy with adenoid biopsy;  Surgeon: Ruby Cola, MD;  Location: [facility];  Service: ENT;  Laterality: N/A;   NECK SURGERY  2016   x 3 all together   POSTERIOR CERVICAL FUSION/FORAMINOTOMY N/A 11/13/2016   Procedure: CERVICAL TWO-CERVICAL SIX POSTERIOR CERVICAL FUSION WITH LATERAL MASS FIXATION;  Surgeon: Kristeen Miss, MD;  Location: [facility];  Service: Neurosurgery;  Laterality: N/A;  posterior approach   REDUCTION MAMMAPLASTY     ROTATOR CUFF REPAIR Left    SHOULDER ACROMIOPLASTY Left 05/18/2015   Procedure: SHOULDER ACROMIOPLASTY;  Surgeon: Earlie Server, MD;  Location: [facility];  Service: Orthopedics;  Laterality: Left;   SHOULDER ARTHROSCOPY WITH DISTAL CLAVICLE RESECTION Left 05/18/2015   Procedure: LEFT SHOULDER ARTHROSCOPY WITH  DISTAL CLAVICLE RESECTION;  Surgeon: Earlie Server, MD;  Location: [facility];  Service: Orthopedics;  Laterality: Left;    Home Medications:  Allergies as of 09/04/2021       Reactions   Shellfish Allergy Anaphylaxis, Hives   "Swells throat up"   Peanut-containing Drug Products Hives   Codeine Other (See Comments)   jitters   Diphtheria Toxoid Rash   Tetanus Toxoids Rash        Medication List        Accurate as of September 04, 2021  9:53 PM. If you have any questions, ask your nurse or doctor.          acetaminophen 500 MG tablet Commonly known as:  TYLENOL Take 500 mg by mouth 2 (two) times daily as needed for headache.   amLODipine 5 MG tablet Commonly known as: NORVASC Take 5 mg by mouth daily.   Cholecalciferol 75 MCG (3000 UT) Tabs Take 3,000 Units by mouth daily.   escitalopram 10 MG tablet Commonly known as: LEXAPRO Take 10 mg by mouth daily as needed (anxiety).   estradiol 1 MG tablet Commonly known as: ESTRACE Take 1.5 mg by mouth daily.   gabapentin 300 MG capsule Commonly known as: NEURONTIN Take 1 capsule (300 mg total) by mouth 3 (three) times daily.   Gemtesa 75 MG Tabs Generic drug: Vibegron Take 75 mg by mouth daily.   liothyronine 5 MCG tablet Commonly known as: CYTOMEL Take 5 mcg by mouth daily.   nortriptyline 10 MG capsule Commonly known as: PAMELOR Take 10 mg by mouth at bedtime as needed for sleep.   pantoprazole 40 MG tablet Commonly known as: PROTONIX Take 40 mg by mouth daily.   polyethylene glycol 17 g packet Commonly known as: MIRALAX / GLYCOLAX Take 17 g by mouth  2 (two) times daily.   rizatriptan 10 MG tablet Commonly known as: MAXALT Take 10 mg by mouth as needed for migraine. May repeat in 2 hours if needed   senna-docusate 8.6-50 MG tablet Commonly known as: Senokot-S Take 1 tablet by mouth 2 (two) times daily.   valACYclovir 1000 MG tablet Commonly known as: VALTREX Take 1,000 mg by mouth daily.   VISINE OP Apply 1 drop to eye daily as needed (irritation).        Allergies:  Allergies  Allergen Reactions   Shellfish Allergy Anaphylaxis and Hives    "Swells throat up"   Peanut-Containing Drug Products Hives   Codeine Other (See Comments)    jitters   Diphtheria Toxoid Rash   Tetanus Toxoids Rash    Family History: Family History  Problem Relation Age of Onset   Cancer Father        prostate cancer   Hypertension Father    Hypertension Other    Breast cancer Neg Hx     Social History:  reports that she has never smoked. She has never used smokeless  tobacco. She reports that she does not drink alcohol and does not use drugs.   Physical Exam: BP 104/71    Pulse (!) 108    Ht 5\' 1"  (1.549 m)    Wt 139 lb (63 kg)    BMI 26.26 kg/m   Constitutional:  Alert and oriented, No acute distress. HEENT: [place] AT, moist mucus membranes.  Trachea midline, no masses. Cardiovascular: No clubbing, cyanosis, or edema. Respiratory: Normal respiratory effort, no increased work of breathing. Psychiatric: Normal mood and affect.   Assessment & Plan:   61 y.o. female with pelvic pain, storage related voiding symptoms and microhematuria We discussed the possibility of painful bladder syndrome She was scheduled for outpatient cystoscopy today however I recommended scheduling cystoscopy under anesthesia with hydrodistention. Ureters were not completely opacified on CT urogram and we will also perform bilateral retrograde pyelograms and possible ureteroscopy The procedures were discussed in detail including potential risks of bleeding, infection and rarely bladder injury. We also discussed her symptoms may be worse for 1 to 2 weeks after the procedure and that approximately 30% of patients receive improvement in symptoms after hydrodistention. All questions were answered and she desires to schedule   Abbie Sons, MD  [facility] [address], [place], [place] [address] [phone]

## 2021-09-05 ENCOUNTER — Telehealth: Payer: Self-pay | Admitting: Urology

## 2021-09-05 NOTE — Telephone Encounter (Signed)
She does have an ovarian cyst and recc she contact her gynecologist

## 2021-09-05 NOTE — Telephone Encounter (Signed)
Pt called in saying she is having really bad, sharp pains in her vagina and would like for someone to give her a call. Informed pt may not get a call back today.

## 2021-09-05 NOTE — Telephone Encounter (Signed)
Notified patient as instructed, patient pleased °

## 2021-09-05 NOTE — Telephone Encounter (Signed)
Patient called and stated that she needs a referral sent to [facility]

## 2021-09-05 NOTE — Telephone Encounter (Signed)
Patient states she is all ready a patient of [facility]

## 2021-09-10 ENCOUNTER — Other Ambulatory Visit: Payer: Self-pay | Admitting: Urology

## 2021-09-10 ENCOUNTER — Encounter: Payer: Self-pay | Admitting: Urology

## 2021-09-10 DIAGNOSIS — R102 Pelvic and perineal pain: Secondary | ICD-10-CM

## 2021-09-10 DIAGNOSIS — R3129 Other microscopic hematuria: Secondary | ICD-10-CM

## 2021-09-10 NOTE — Progress Notes (Signed)
Surgical Physician Order Form [facility] [place]  * Scheduling expectation :  next avail  *Length of Case: 30 min  *Clearance needed: no  *Anticoagulation Instructions: N/A  *Aspirin Instructions: N/A  *Post-op visit Date/Instructions:  1 month follow up w/ PA  *Diagnosis:  Pelvic pain; microhematuria  *Procedure:   Cystoscopy with hydrodistention; bilateral retrograde pyelogram; possible bladder biopsy, possible bilateral ureteroscopy   Additional orders: N/A  -Admit type: OUTpatient  -Anesthesia: Choice  -VTE Prophylaxis Standing Order SCDs       Other:   -Standing Lab Orders Per Anesthesia    Lab other: none  -Standing Test orders EKG/Chest x-ray per Anesthesia       Test other:   - Medications:  Ancef 2gm IV  -Other orders:  N/A

## 2021-09-14 ENCOUNTER — Encounter: Payer: Medicare Other | Admitting: Student in an Organized Health Care Education/Training Program

## 2021-09-14 NOTE — Progress Notes (Signed)
[facility] Posting Form   Surgery Date/Time: Date: 09/26/2021  Surgeon: Dr. John Giovanni, MD  Surgery Location: Day Surgery  Inpt ( No  )   Outpt (Yes)   Obs ( No  )   Diagnosis: Pelvic Pain R10.2, Microhematuria R31.29  -CPT: [id number],BTYOMAYO [id number], possible 52351  Surgery: Cystoscopy with Hydrodistention and bilateral retrograde pyelogram; Possible bladder biopsy and possible bilateral ureteroscopy  Stop Anticoagulations: N/A  Cardiac/Medical/Pulmonary Clearance needed: no  *Orders entered into EPIC  Date: 09/14/21   *Case booked in EPIC  Date: 09/13/2021  *Notified pt of Surgery: Date: 09/13/2021  PRE-OP UA & CX: no  *Placed into Prior Authorization Work Que Date: 09/14/21   Assistant/laser/rep:No

## 2021-09-20 ENCOUNTER — Other Ambulatory Visit: Payer: Self-pay

## 2021-09-20 ENCOUNTER — Encounter
Admission: RE | Admit: 2021-09-20 | Discharge: 2021-09-20 | Disposition: A | Discharge status: Home / Self Care | Payer: Medicare Other | Source: Ambulatory Visit | Attending: Urology | Admitting: Urology

## 2021-09-20 HISTORY — DX: Hypothyroidism, unspecified: E03.9

## 2021-09-20 HISTORY — DX: Other complications of anesthesia, initial encounter: T88.59XA

## 2021-09-20 NOTE — Patient Instructions (Addendum)
Your procedure is scheduled on:09-26-21 Tuesday Report to the Registration Desk on the 1st floor of the [facility].Then proceed to the 2nd floor Surgery Desk in the [facility] To find out your arrival time, please call [phone] between 1PM - 3PM on:09-25-21 Monday  REMEMBER: Instructions that are not followed completely may result in serious medical risk, up to and including death; or upon the discretion of your surgeon and anesthesiologist your surgery may need to be rescheduled.  Do not eat food OR drink any liquids after midnight the night before surgery.  No gum chewing, lozengers or hard candies.  TAKE THESE MEDICATIONS THE MORNING OF SURGERY WITH A SIP OF WATER: -acyclovir (ZOVIRAX) -amLODipine (NORVASC)  -gabapentin (NEURONTIN)  -liothyronine (CYTOMEL)  -pantoprazole (PROTONIX)-take one the night before and one on the morning of surgery - helps to prevent nausea after surgery.) -You may take your Oxycodone if needed the day of surgery for pain  One week prior to surgery: Stop Anti-inflammatories (NSAIDS) such as Advil, Aleve, Ibuprofen, Motrin, Naproxen, Naprosyn and Aspirin based products such as Excedrin, Goodys Powder, BC Powder.You may however, take Tylenol/Oxycodone if needed for pain up until the day of surgery.  Stop ANY OVER THE COUNTER supplements/vitamins NOW (09-20-21) until after surgery (Vitamin D)  No Alcohol for 24 hours before or after surgery.  No Smoking including e-cigarettes for 24 hours prior to surgery.  No chewable tobacco products for at least 6 hours prior to surgery.  No nicotine patches on the day of surgery.  Do not use any "recreational" drugs for at least a week prior to your surgery.  Please be advised that the combination of cocaine and anesthesia may have negative outcomes, up to and including death. If you test positive for cocaine, your surgery will be cancelled.  On the morning of surgery brush your teeth with toothpaste and water,  you may rinse your mouth with mouthwash if you wish. Do not swallow any toothpaste or mouthwash.  Do not wear jewelry, make-up, hairpins, clips or nail polish.  Do not wear lotions, powders, or perfumes.   Do not shave body from the neck down 48 hours prior to surgery just in case you cut yourself which could leave a site for infection.  Also, freshly shaved skin may become irritated if using the CHG soap.  Contact lenses, hearing aids and dentures may not be worn into surgery.  Do not bring valuables to the hospital. [facility] is not responsible for any missing/lost belongings or valuables.  Notify your doctor if there is any change in your medical condition (cold, fever, infection).  Wear comfortable clothing (specific to your surgery type) to the hospital.  After surgery, you can help prevent lung complications by doing breathing exercises.  Take deep breaths and cough every 1-2 hours. Your doctor may order a device called an Incentive Spirometer to help you take deep breaths. When coughing or sneezing, hold a pillow firmly against your incision with both hands. This is called splinting. Doing this helps protect your incision. It also decreases belly discomfort.  If you are being admitted to the hospital overnight, leave your suitcase in the car. After surgery it may be brought to your room.  If you are being discharged the day of surgery, you will not be allowed to drive home. You will need a responsible adult (18 years or older) to drive you home and stay with you that night.   If you are taking public transportation, you will need to  have a responsible adult (18 years or older) with you. Please confirm with your physician that it is acceptable to use public transportation.   Please call the [facility] at [phone] if you have any questions about these instructions.  Surgery Visitation Policy:  Patients undergoing a surgery or procedure may have one  family member or support person with them as long as that person is not COVID-19 positive or experiencing its symptoms.  That person may remain in the waiting area during the procedure and may rotate out with other people.  Inpatient Visitation:    Visiting hours are 7 a.m. to 8 p.m. Up to two visitors ages 16+ are allowed at one time in a patient room. The visitors may rotate out with other people during the day. Visitors must check out when they leave, or other visitors will not be allowed. One designated support person may remain overnight. The visitor must pass COVID-19 screenings, use hand sanitizer when entering and exiting the patients room and wear a mask at all times, including in the patients room. Patients must also wear a mask when staff or their visitor are in the room. Masking is required regardless of vaccination status.

## 2021-09-21 ENCOUNTER — Encounter: Payer: Self-pay | Admitting: Student in an Organized Health Care Education/Training Program

## 2021-09-21 ENCOUNTER — Encounter
Admission: RE | Admit: 2021-09-21 | Discharge: 2021-09-21 | Disposition: A | Discharge status: Home / Self Care | Payer: Medicare Other | Source: Ambulatory Visit | Attending: Urology | Admitting: Urology

## 2021-09-21 ENCOUNTER — Ambulatory Visit (HOSPITAL_BASED_OUTPATIENT_CLINIC_OR_DEPARTMENT_OTHER): Payer: Medicare Other | Admitting: Student in an Organized Health Care Education/Training Program

## 2021-09-21 VITALS — BP 144/102 | HR 99 | Temp 98.0°F | Resp 18 | Ht 61.0 in | Wt 140.0 lb

## 2021-09-21 DIAGNOSIS — M5030 Other cervical disc degeneration, unspecified cervical region: Secondary | ICD-10-CM

## 2021-09-21 DIAGNOSIS — Z9889 Other specified postprocedural states: Secondary | ICD-10-CM | POA: Insufficient documentation

## 2021-09-21 DIAGNOSIS — M47812 Spondylosis without myelopathy or radiculopathy, cervical region: Secondary | ICD-10-CM | POA: Insufficient documentation

## 2021-09-21 DIAGNOSIS — R519 Headache, unspecified: Secondary | ICD-10-CM | POA: Insufficient documentation

## 2021-09-21 DIAGNOSIS — G894 Chronic pain syndrome: Secondary | ICD-10-CM | POA: Insufficient documentation

## 2021-09-21 DIAGNOSIS — M7918 Myalgia, other site: Secondary | ICD-10-CM | POA: Insufficient documentation

## 2021-09-21 DIAGNOSIS — I10 Essential (primary) hypertension: Secondary | ICD-10-CM | POA: Diagnosis not present

## 2021-09-21 DIAGNOSIS — Z01810 Encounter for preprocedural cardiovascular examination: Secondary | ICD-10-CM | POA: Insufficient documentation

## 2021-09-21 DIAGNOSIS — Q761 Klippel-Feil syndrome: Secondary | ICD-10-CM | POA: Insufficient documentation

## 2021-09-21 DIAGNOSIS — M5481 Occipital neuralgia: Secondary | ICD-10-CM | POA: Insufficient documentation

## 2021-09-21 MED ORDER — OXYCODONE HCL 10 MG PO TABS
10.0000 mg | ORAL_TABLET | Freq: Three times a day (TID) | ORAL | 0 refills | Status: AC | PRN
Start: 2021-09-21 — End: 2021-10-21

## 2021-09-21 MED ORDER — OXYCODONE HCL 10 MG PO TABS: 10.0000 mg | ORAL_TABLET | Freq: Three times a day (TID) | ORAL | 0 refills | Status: DC | PRN

## 2021-09-21 MED ORDER — OXYCODONE HCL 10 MG PO TABS: 10.0000 mg | ORAL_TABLET | Freq: Three times a day (TID) | ORAL | 0 refills | Status: AC | PRN

## 2021-09-21 MED ORDER — GABAPENTIN 400 MG PO CAPS: 400.0000 mg | ORAL_CAPSULE | Freq: Three times a day (TID) | ORAL | 5 refills | Status: DC

## 2021-09-21 NOTE — Progress Notes (Signed)
PROVIDER NOTE: Information contained herein reflects review and annotations entered in association with encounter. Interpretation of such information and data should be left to medically-trained personnel. Information provided to patient can be located elsewhere in the medical record under "Patient Instructions". Document created using STT-dictation technology, any transcriptional errors that may result from process are unintentional.    Patient: Kimberly [name]  Service Category: E/M  Provider: Gillis Santa, MD  DOB: 1960-12-22  DOS: 09/21/2021  Specialty: Interventional Pain Management  MRN: [id number]  Setting: Ambulatory outpatient  PCP: Kimberly Body, MD  Type: Established Patient    Referring Provider: Dion Body, MD  Location: Office  Delivery: Face-to-face     HPI  [name], a 61 y.o. year old female, is here today because of her Cervical spondylosis without myelopathy [M47.812]. Kimberly [name] primary complain today is Headache  Last encounter: My last encounter with her was on 06/15/21  Pertinent problems: Kimberly [name] has SHOULDER PAIN; IMPINGEMENT SYNDROME; RUPTURE ROTATOR CUFF; CTS (carpal tunnel syndrome); Cervical spondylosis without myelopathy; Cervical stenosis of spinal canal; Pseudoarthrosis of cervical spine ([place]); Numbness and tingling; Vitamin D deficiency; Cervical fusion syndrome; Hx of cervical spine surgery; Chronic pain syndrome; Cervicalgia; Controlled substance agreement signed; Depression, major, single episode, complete remission ([place]); Cervical myofascial pain syndrome; and Occipital headache on their pertinent problem list. Pain Assessment: Severity of Chronic pain is reported as a 8 /10. Location: Head Posterior/denies. Onset: More than a month ago. Quality: Aching, Sharp. Timing: Constant. Modifying factor(s): heat. Vitals:  height is 5' 1" (1.549 m) and weight is 140 lb (63.5 kg). Her temperature is 98 F (36.7 C). Her blood pressure is 144/102  (abnormal) and her pulse is 99. Her respiration is 18 and oxygen saturation is 100%.   Reason for encounter: medication management.    No change in medical history since last visit other than very bad yeast infection. Patient's pain is at baseline.  Patient continues multimodal pain regimen as prescribed.  States that it provides pain relief and improvement in functional status.    Pharmacotherapy Assessment  Analgesic: Oxycodone 10 mg every 8 hours as needed, quantity 90/month; MME equals 45    Monitoring: [place] PMP: PDMP reviewed during this encounter.       Pharmacotherapy: No side-effects or adverse reactions reported. Compliance: No problems identified. Effectiveness: Clinically acceptable.  UDS:  Summary  Date Value Ref Range Status  02/08/2021 Note  Final    Comment:    ==================================================================== [employer] Select 13 (MW) ==================================================================== Test                             Result       Flag       Units  Drug Present and Declared for Prescription Verification   Oxycodone                      1168         EXPECTED   ng/mg creat   Oxymorphone                    2167         EXPECTED   ng/mg creat   Noroxycodone                   1622         EXPECTED   ng/mg creat   Noroxymorphone  560          EXPECTED   ng/mg creat    Sources of oxycodone are scheduled prescription medications.    Oxymorphone, noroxycodone, and noroxymorphone are expected    metabolites of oxycodone. Oxymorphone is also available as a    scheduled prescription medication.  Drug Absent but Declared for Prescription Verification   Diazepam                       Not Detected UNEXPECTED ng/mg creat   Alprazolam                     Not Detected UNEXPECTED ng/mg creat ==================================================================== Test                      Result    Flag   Units      Ref Range    Creatinine              183              mg/dL      >=20 ==================================================================== Declared Medications:  The flagging and interpretation on this report are based on the  following declared medications.  Unexpected results may arise from  inaccuracies in the declared medications.   **Note: The testing scope of this panel includes these medications:   Alprazolam (Xanax)  Diazepam (Valium)  Oxycodone   **Note: The testing scope of this panel does not include the  following reported medications:   Acetaminophen (Tylenol)  Acyclovir  Amlodipine  Cholecalciferol  Duloxetine (Cymbalta)  Escitalopram (Lexapro)  Estradiol (Estrace)  Eye Drop  Gabapentin  Liothyronine (Cytomel)  Nortriptyline (Pamelor)  Nystatin  Pantoprazole (Protonix) ==================================================================== For clinical consultation, please call [phone]. ====================================================================       ROS  Constitutional: Denies any fever or chills +headaches, occipital dominant Gastrointestinal: No reported hemesis, hematochezia, vomiting, or acute GI distress Musculoskeletal: Denies any acute onset joint swelling, redness, loss of ROM, or weakness Neurological: No reported episodes of acute onset apraxia, aphasia, dysarthria, agnosia, amnesia, paralysis, loss of coordination, or loss of consciousness  Medication Review  NONFORMULARY OR COMPOUNDED ITEM, Oxycodone HCl, Tetrahydrozoline HCl, Vibegron, acetaminophen, acyclovir, amLODipine, cholecalciferol, estradiol, gabapentin, liothyronine, pantoprazole, rizatriptan, and senna-docusate  History Review  Allergy: Kimberly [name] is allergic to shellfish allergy, peanut-containing drug products, codeine, diphtheria toxoid, and tetanus toxoids. Drug: Kimberly [name]  reports no history of drug use. Alcohol:  reports current alcohol use. Tobacco:  reports that she  has never smoked. She has never used smokeless tobacco. Social: Kimberly [name]  reports that she has never smoked. She has never used smokeless tobacco. She reports current alcohol use. She reports that she does not use drugs. Medical:  has a past medical history of Anxiety, Arthritis, Asthma, Balance problem, Brain cyst, Cervical fusion syndrome, Complete rupture of rotator cuff, Complication of anesthesia, Depression, Difficult intubation, Flu (08/10/2018), GERD (gastroesophageal reflux disease), Headache, Herpes, History of kidney stones, History of pneumonia, Hypertension, Hypothyroidism, Inflammation of shoulder joint, Memory difficulties, Neuromuscular disorder ([place]), Occipital neuralgia, Pneumothorax on right, Pseudoarthrosis of cervical spine ([place]), Recurrent falls, Syncope, and Urinary frequency. Surgical: Ms. Stoffer  has a past surgical history that includes Abdominal hysterectomy; Rotator cuff repair (Left); Foreign [name] Removal (Left); Carpal tunnel release (Left, 02/16/2013); Carpal tunnel release (Right, 03/27/2013); Cesarean section; Anterior cervical decomp/discectomy fusion (N/A, 08/06/2014); Colonoscopy; Nasal sinus surgery (N/A, 04/13/2015); Shoulder arthroscopy with distal clavicle resection (Left, 05/18/2015); Shoulder acromioplasty (Left, 05/18/2015);  Cholecystectomy (N/A, 06/17/2015); Neck surgery (2016); Anterior cervical decomp/discectomy fusion (N/A, 07/04/2016); Posterior cervical fusion/foraminotomy (N/A, 11/13/2016); Knee arthroscopy; Extracorporeal shock wave lithotripsy (Left, 09/04/2018); Colonoscopy with propofol (N/A, 05/06/2019); Reduction mammaplasty; and Esophagogastroduodenoscopy (N/A, 05/08/2021). Family: family history includes Cancer in her father; Hypertension in her father and another family member.  Laboratory Chemistry Profile   Renal Lab Results  Component Value Date   BUN 11 08/26/2021   CREATININE 0.90 08/26/2021   BCR 10 07/01/2019   GFRAA 86 07/01/2019   GFRNONAA  >60 08/26/2021     Hepatic Lab Results  Component Value Date   AST 20 08/10/2021   ALT 12 08/10/2021   ALBUMIN 3.3 (L) 08/10/2021   ALKPHOS 58 08/10/2021   LIPASE 34 08/10/2021     Electrolytes Lab Results  Component Value Date   NA 136 08/26/2021   K 3.5 08/26/2021   CL 99 08/26/2021   CALCIUM 9.6 08/26/2021     Bone No results found for: VD25OH, VD125OH2TOT, SR1594VO5, FY9244QK8, 25OHVITD1, 25OHVITD2, 25OHVITD3, TESTOFREE, TESTOSTERONE   Inflammation (CRP: Acute Phase) (ESR: Chronic Phase) Lab Results  Component Value Date   ESRSEDRATE 35 (H) 08/12/2021       Note: Above Lab results reviewed.  Recent Imaging Review  CT HEMATURIA WORKUP CLINICAL DATA:  Microhematuria, bladder pain and urinary frequency  EXAM: CT ABDOMEN AND PELVIS WITHOUT AND WITH CONTRAST  TECHNIQUE: Multidetector CT imaging of the abdomen and pelvis was performed following the standard protocol before and following the bolus administration of intravenous contrast.  RADIATION DOSE REDUCTION: This exam was performed according to the departmental dose-optimization program which includes automated exposure control, adjustment of the mA and/or kV according to patient size and/or use of iterative reconstruction technique.  CONTRAST:  31m OMNIPAQUE IOHEXOL 350 MG/ML SOLN  COMPARISON:  08/11/2021, 08/04/2018  FINDINGS: Lower chest: No acute abnormality.  Hepatobiliary: No focal liver abnormality is seen. Status post cholecystectomy. Postoperative biliary dilatation.  Pancreas: Unremarkable. No pancreatic ductal dilatation or surrounding inflammatory changes.  Spleen: Normal in size without significant abnormality.  Adrenals/Urinary Tract: Adrenal glands are unremarkable. Punctuate nonobstructive calculus of the superior pole of the left kidney. No right-sided calculi, ureteral calculi, or hydronephrosis. Limited opacification of the distal right ureter and proximal left ureter. Within  this limitation, no evidence of urinary tract filling defect on delayed phase imaging. Bladder is unremarkable.  Stomach/Bowel: Stomach is within normal limits. Appendix appears normal. No evidence of bowel wall thickening, distention, or inflammatory changes. Large burden of stool throughout the colon.  Vascular/Lymphatic: No significant vascular findings are present. Unchanged subcentimeter retroperitoneal lymph nodes and mild retroperitoneal fat stranding (series 9, image 33).  Reproductive: Status post hysterectomy. Unchanged, thinly septated left adnexal cyst measuring 4.8 x 2.9 cm, stable in comparison to examination dated 08/04/2018 and benign (series 9, image 65).  Other: No abdominal wall hernia or abnormality. No ascites.  Musculoskeletal: No acute or significant osseous findings.  IMPRESSION: 1. Punctuate nonobstructive calculus of the superior pole of the left kidney. No right-sided calculi, ureteral calculi, or hydronephrosis. 2. No evidence of urinary tract mass or suspicious contrast enhancement. 3. Limited opacification of the distal right ureter and proximal left ureter. Within this limitation, no evidence of urinary tract filling defect on delayed phase imaging. 4. No abnormality of the urinary bladder to explain bladder pain or urinary frequency.  Electronically Signed   By: ADelanna AhmadiM.D.   On: 08/27/2021 11:16  Note: Reviewed        Physical Exam  General appearance: Well nourished, well developed, and well hydrated. In no apparent acute distress Mental status: Alert, oriented x 3 (person, place, & time)       Respiratory: No evidence of acute respiratory distress Eyes: PERLA Vitals: BP (!) 144/102    Pulse 99    Temp 98 F (36.7 C)    Resp 18    Ht 5' 1" (1.549 m)    Wt 140 lb (63.5 kg)    SpO2 100%    BMI 26.45 kg/m  BMI: Estimated [name] mass index is 26.45 kg/m as calculated from the following:   Height as of this encounter: 5' 1" (1.549 m).    Weight as of this encounter: 140 lb (63.5 kg). Ideal: Ideal [name] weight: 47.8 kg (105 lb 6.1 oz) Adjusted ideal [name] weight: 54.1 kg (119 lb 3.7 oz)  Cervical Spine Exam  Skin & Axial Inspection: Well healed scar from previous spine surgery detected Alignment: Symmetrical Functional ROM: Pain restricted ROM      Stability: No instability detected Muscle Tone/Strength: Functionally intact. No obvious neuro-muscular anomalies detected. Sensory (Neurological): Neurogenic pain pattern Palpation: No palpable anomalies                          Upper Extremity (UE) Exam      Side: Right upper extremity   Side: Left upper extremity    Skin & Extremity Inspection: Skin color, temperature, and hair growth are WNL. No peripheral edema or cyanosis. No masses, redness, swelling, asymmetry, or associated skin lesions. No contractures.   Skin & Extremity Inspection: Skin color, temperature, and hair growth are WNL. No peripheral edema or cyanosis. No masses, redness, swelling, asymmetry, or associated skin lesions. No contractures.    Functional ROM: Unrestricted ROM           Functional ROM: Unrestricted ROM            Muscle Tone/Strength: Functionally intact. No obvious neuro-muscular anomalies detected.   Muscle Tone/Strength: Functionally intact. No obvious neuro-muscular anomalies detected.    Sensory (Neurological): Unimpaired           Sensory (Neurological): Unimpaired            Palpation: No palpable anomalies               Palpation: No palpable anomalies                Provocative Test(s):  Phalen's test: deferred Tinel's test: deferred Apley's scratch test (touch opposite shoulder):  Action 1 (Across chest): deferred Action 2 (Overhead): deferred Action 3 (LB reach): deferred     Provocative Test(s):  Phalen's test: deferred Tinel's test: deferred Apley's scratch test (touch opposite shoulder):  Action 1 (Across chest): deferred Action 2 (Overhead): deferred Action 3 (LB reach):  deferred       Assessment   Status Diagnosis  Controlled Controlled Controlled 1. Cervical spondylosis without myelopathy   2. Hx of cervical spine surgery   3. Cervical myofascial pain syndrome   4. Cervical fusion syndrome (C2-T1)   5. Bilateral occipital neuralgia   6. Degenerative disc disease, cervical   7. Cervical facet joint syndrome   8. Occipital headache   9. Chronic pain syndrome         Plan of Care   Ms. LILYONA RICHNER has a current medication list which includes the following long-term medication(s): liothyronine and gabapentin.  Pharmacotherapy (Medications Ordered): Meds ordered this encounter  Medications  Oxycodone HCl 10 MG TABS    Sig: Take 1 tablet (10 mg total) by mouth every 8 (eight) hours as needed (pain).    Dispense:  90 tablet    Refill:  0   Oxycodone HCl 10 MG TABS    Sig: Take 1 tablet (10 mg total) by mouth every 8 (eight) hours as needed (pain).    Dispense:  90 tablet    Refill:  0   Oxycodone HCl 10 MG TABS    Sig: Take 1 tablet (10 mg total) by mouth every 8 (eight) hours as needed (pain).    Dispense:  90 tablet    Refill:  0   gabapentin (NEURONTIN) 400 MG capsule    Sig: Take 1 capsule (400 mg total) by mouth 3 (three) times daily.    Dispense:  90 capsule    Refill:  5     Follow-up plan:   Return in about 3 months (around 12/19/2021) for Medication Management, in person.     cervical TPI, cervical facet medial branch nerve block, suprascapular nerve block, bilateral C4, C5, C6   cervical facet medial branch nerve block 07/04/2020: Not effective, do not repeat.        Recent Visits No visits were found meeting these conditions. Showing recent visits within past 90 days and meeting all other requirements Today's Visits Date Type Provider Dept  09/21/21 Office Visit Kimberly Santa, MD [facility]  Showing today's visits and meeting all other requirements Future Appointments No visits were found meeting these  conditions. Showing future appointments within next 90 days and meeting all other requirements  I discussed the assessment and treatment plan with the patient. The patient was provided an opportunity to ask questions and all were answered. The patient agreed with the plan and demonstrated an understanding of the instructions.  Patient advised to call back or seek an in-person evaluation if the symptoms or condition worsens.  Duration of encounter: 30 minutes.  Note by: Kimberly Santa, MD Date: 09/21/2021; Time: 10:08 AM

## 2021-09-21 NOTE — Progress Notes (Signed)
Nursing Pain Medication Assessment:  Safety precautions to be maintained throughout the outpatient stay will include: orient to surroundings, keep bed in low position, maintain call bell within reach at all times, provide assistance with transfer out of bed and ambulation.  Medication Inspection Compliance: Kimberly [name] did not comply with our request to bring her pills to be counted. She was reminded that bringing the medication bottles, even when empty, is a requirement.  Medication: None brought in. Pill/Patch Count: None available to be counted. Bottle Appearance: No container available. Did not bring bottle(s) to appointment. Filled Date: N/A Last Medication intake:  Today

## 2021-09-22 ENCOUNTER — Inpatient Hospital Stay: Admission: RE | Admit: 2021-09-22 | Payer: Medicare Other | Source: Ambulatory Visit

## 2021-09-25 MED ORDER — ORAL CARE MOUTH RINSE: 15.0000 mL | Freq: Once | OROMUCOSAL | Status: DC

## 2021-09-25 MED ORDER — LACTATED RINGERS IV SOLN: INTRAVENOUS | Status: DC

## 2021-09-25 MED ORDER — CEFAZOLIN SODIUM-DEXTROSE 2-4 GM/100ML-% IV SOLN
2.0000 g | INTRAVENOUS | Status: AC
  Administered 2021-09-26: 2 g via INTRAVENOUS

## 2021-09-25 MED ORDER — CHLORHEXIDINE GLUCONATE 0.12 % MT SOLN: 15.0000 mL | Freq: Once | OROMUCOSAL | Status: DC

## 2021-09-26 ENCOUNTER — Ambulatory Visit: Payer: Medicare Other | Admitting: Urgent Care

## 2021-09-26 ENCOUNTER — Encounter: Payer: Self-pay | Admitting: Urology

## 2021-09-26 ENCOUNTER — Ambulatory Visit: Payer: Medicare Other

## 2021-09-26 ENCOUNTER — Ambulatory Visit
Admission: RE | Admit: 2021-09-26 | Discharge: 2021-09-26 | Disposition: A | Discharge status: Home / Self Care | Payer: Medicare Other | Attending: Urology | Admitting: Urology

## 2021-09-26 ENCOUNTER — Encounter
Admission: RE | Disposition: A | Discharge status: Home / Self Care | Payer: Self-pay | Source: Home / Self Care | Attending: Urology

## 2021-09-26 ENCOUNTER — Other Ambulatory Visit: Payer: Self-pay

## 2021-09-26 DIAGNOSIS — R3129 Other microscopic hematuria: Secondary | ICD-10-CM

## 2021-09-26 DIAGNOSIS — N390 Urinary tract infection, site not specified: Secondary | ICD-10-CM | POA: Diagnosis present

## 2021-09-26 DIAGNOSIS — I10 Essential (primary) hypertension: Secondary | ICD-10-CM | POA: Diagnosis not present

## 2021-09-26 DIAGNOSIS — R102 Pelvic and perineal pain: Secondary | ICD-10-CM

## 2021-09-26 DIAGNOSIS — F418 Other specified anxiety disorders: Secondary | ICD-10-CM | POA: Insufficient documentation

## 2021-09-26 DIAGNOSIS — K219 Gastro-esophageal reflux disease without esophagitis: Secondary | ICD-10-CM | POA: Insufficient documentation

## 2021-09-26 HISTORY — PX: CYSTOSCOPY WITH BIOPSY: SHX5122

## 2021-09-26 HISTORY — PX: CYSTOSCOPY W/ RETROGRADES: SHX1426

## 2021-09-26 HISTORY — PX: CYSTO WITH HYDRODISTENSION: SHX5453

## 2021-09-26 HISTORY — PX: URETEROSCOPY: SHX842

## 2021-09-26 SURGERY — CYSTOSCOPY, WITH BLADDER HYDRODISTENSION
Anesthesia: General

## 2021-09-26 MED ORDER — URIBEL 118 MG PO CAPS: 1.0000 | ORAL_CAPSULE | Freq: Three times a day (TID) | ORAL | 0 refills | Status: DC | PRN

## 2021-09-26 MED ORDER — FENTANYL CITRATE (PF) 100 MCG/2ML IJ SOLN
INTRAMUSCULAR | Status: DC | PRN
Start: 2021-09-26 — End: 2021-09-26
  Administered 2021-09-26: 50 ug via INTRAVENOUS

## 2021-09-26 MED ORDER — IOHEXOL 180 MG/ML  SOLN
INTRAMUSCULAR | Status: DC | PRN
  Administered 2021-09-26: 20 mL

## 2021-09-26 MED ORDER — FENTANYL CITRATE (PF) 100 MCG/2ML IJ SOLN
INTRAMUSCULAR | Status: AC
  Filled 2021-09-26: qty 2

## 2021-09-26 MED ORDER — CEFAZOLIN SODIUM-DEXTROSE 2-4 GM/100ML-% IV SOLN
INTRAVENOUS | Status: AC
  Filled 2021-09-26: qty 100

## 2021-09-26 MED ORDER — MIDAZOLAM HCL 2 MG/2ML IJ SOLN
INTRAMUSCULAR | Status: AC
  Filled 2021-09-26: qty 2

## 2021-09-26 MED ORDER — KETAMINE HCL 10 MG/ML IJ SOLN
INTRAMUSCULAR | Status: DC | PRN
  Administered 2021-09-26: 50 mg via INTRAVENOUS

## 2021-09-26 MED ORDER — PROPOFOL 500 MG/50ML IV EMUL
INTRAVENOUS | Status: AC
  Filled 2021-09-26: qty 50

## 2021-09-26 MED ORDER — LIDOCAINE HCL (PF) 2 % IJ SOLN
INTRAMUSCULAR | Status: AC
  Filled 2021-09-26: qty 5

## 2021-09-26 MED ORDER — OXYBUTYNIN CHLORIDE 5 MG PO TABS: ORAL_TABLET | ORAL | 1 refills | Status: DC

## 2021-09-26 MED ORDER — ONDANSETRON HCL 4 MG/2ML IJ SOLN
INTRAMUSCULAR | Status: DC | PRN
  Administered 2021-09-26: 4 mg via INTRAVENOUS

## 2021-09-26 MED ORDER — PROPOFOL 10 MG/ML IV BOLUS
INTRAVENOUS | Status: DC | PRN
  Administered 2021-09-26: 150 mg via INTRAVENOUS

## 2021-09-26 MED ORDER — KETAMINE HCL 50 MG/5ML IJ SOSY
PREFILLED_SYRINGE | INTRAMUSCULAR | Status: AC
  Filled 2021-09-26: qty 5

## 2021-09-26 MED ORDER — DEXAMETHASONE SODIUM PHOSPHATE 10 MG/ML IJ SOLN
INTRAMUSCULAR | Status: DC | PRN
  Administered 2021-09-26: 10 mg via INTRAVENOUS

## 2021-09-26 MED ORDER — GLYCOPYRROLATE 0.2 MG/ML IJ SOLN
INTRAMUSCULAR | Status: DC | PRN
  Administered 2021-09-26: .4 mg via INTRAVENOUS

## 2021-09-26 MED ORDER — LIDOCAINE HCL (CARDIAC) PF 100 MG/5ML IV SOSY
PREFILLED_SYRINGE | INTRAVENOUS | Status: DC | PRN
Start: 2021-09-26 — End: 2021-09-26
  Administered 2021-09-26: 60 mg via INTRAVENOUS

## 2021-09-26 MED ORDER — BUPIVACAINE HCL (PF) 0.25 % IJ SOLN
INTRAMUSCULAR | Status: AC
  Filled 2021-09-26: qty 30

## 2021-09-26 MED ORDER — MIDAZOLAM HCL 2 MG/2ML IJ SOLN
INTRAMUSCULAR | Status: DC | PRN
  Administered 2021-09-26 (×3): 1 mg via INTRAVENOUS

## 2021-09-26 MED ORDER — PROPOFOL 500 MG/50ML IV EMUL
INTRAVENOUS | Status: DC | PRN
  Administered 2021-09-26: 150 ug/kg/min via INTRAVENOUS

## 2021-09-26 MED ORDER — CHLORHEXIDINE GLUCONATE 0.12 % MT SOLN
OROMUCOSAL | Status: AC
  Filled 2021-09-26: qty 15

## 2021-09-26 MED ORDER — STERILE WATER FOR IRRIGATION IR SOLN
Status: DC | PRN
  Administered 2021-09-26: 3000 mL

## 2021-09-26 MED ORDER — BUPIVACAINE-EPINEPHRINE (PF) 0.25% -1:200000 IJ SOLN
INTRAMUSCULAR | Status: DC | PRN
  Administered 2021-09-26: 30 mL via PERINEURAL

## 2021-09-26 SURGICAL SUPPLY — 36 items
BAG DRAIN CYSTO-URO LG1000N (MISCELLANEOUS) ×3 IMPLANT
BRUSH SCRUB EZ  4% CHG (MISCELLANEOUS) ×3
BRUSH SCRUB EZ 1% IODOPHOR (MISCELLANEOUS) ×3 IMPLANT
BRUSH SCRUB EZ 4% CHG (MISCELLANEOUS) ×2 IMPLANT
CATH URET FLEX-TIP 2 LUMEN 10F (CATHETERS) ×3 IMPLANT
CATH URETL OPEN 5X70 (CATHETERS) ×3 IMPLANT
CATH URETL OPEN END 6X70 (CATHETERS) ×3 IMPLANT
CNTNR SPEC 2.5X3XGRAD LEK (MISCELLANEOUS) ×2
CONT SPEC 4OZ STER OR WHT (MISCELLANEOUS) ×1
CONT SPEC 4OZ STRL OR WHT (MISCELLANEOUS) ×2
CONTAINER SPEC 2.5X3XGRAD LEK (MISCELLANEOUS) ×2 IMPLANT
DRAPE UTILITY 15X26 TOWEL STRL (DRAPES) ×3 IMPLANT
DRSG TELFA 4X3 1S NADH ST (GAUZE/BANDAGES/DRESSINGS) ×3 IMPLANT
ELECT REM PT RETURN 9FT ADLT (ELECTROSURGICAL) ×3
ELECTRODE REM PT RTRN 9FT ADLT (ELECTROSURGICAL) ×2 IMPLANT
GAUZE 4X4 16PLY ~~LOC~~+RFID DBL (SPONGE) ×6 IMPLANT
GLOVE SURG UNDER POLY LF SZ7.5 (GLOVE) ×3 IMPLANT
GOWN STRL REUS W/ TWL LRG LVL3 (GOWN DISPOSABLE) ×4 IMPLANT
GOWN STRL REUS W/ TWL XL LVL3 (GOWN DISPOSABLE) ×2 IMPLANT
GOWN STRL REUS W/TWL LRG LVL3 (GOWN DISPOSABLE) ×6
GOWN STRL REUS W/TWL XL LVL3 (GOWN DISPOSABLE) ×3
GUIDEWIRE GREEN .038 145CM (MISCELLANEOUS) ×3 IMPLANT
GUIDEWIRE STR DUAL SENSOR (WIRE) ×6 IMPLANT
INFUSOR MANOMETER BAG 3000ML (MISCELLANEOUS) ×3 IMPLANT
IV NS IRRIG 3000ML ARTHROMATIC (IV SOLUTION) ×3 IMPLANT
KIT TURNOVER CYSTO (KITS) ×3 IMPLANT
MANIFOLD NEPTUNE II (INSTRUMENTS) ×3 IMPLANT
NDL SAFETY ECLIPSE 18X1.5 (NEEDLE) ×2 IMPLANT
NEEDLE HYPO 18GX1.5 SHARP (NEEDLE) ×3
PACK CYSTO AR (MISCELLANEOUS) ×3 IMPLANT
SET CYSTO W/LG BORE CLAMP LF (SET/KITS/TRAYS/PACK) ×3 IMPLANT
SHEATH URETERAL 12FRX35CM (MISCELLANEOUS) ×3 IMPLANT
SURGILUBE 2OZ TUBE FLIPTOP (MISCELLANEOUS) ×3 IMPLANT
WATER STERILE IRR 1000ML POUR (IV SOLUTION) ×3 IMPLANT
WATER STERILE IRR 3000ML UROMA (IV SOLUTION) ×3 IMPLANT
WATER STERILE IRR 500ML POUR (IV SOLUTION) ×3 IMPLANT

## 2021-09-26 NOTE — Transfer of Care (Signed)
Immediate Anesthesia Transfer of Care Note  Patient: Kimberly [name]  Procedure(s) Performed: CYSTOSCOPY/HYDRODISTENSION CYSTOSCOPY WITH RETROGRADE PYELOGRAM (Bilateral) CYSTOSCOPY WITH BIOPSY URETEROSCOPY (Bilateral)  Patient Location: PACU  Anesthesia Type:General  Level of Consciousness: drowsy  Airway & Oxygen Therapy: Patient Spontanous Breathing and Patient connected to face mask oxygen  Post-op Assessment: Report given to RN and Post -op Vital signs reviewed and stable  Post vital signs: Reviewed and stable  Last Vitals:  Vitals Value Taken Time  BP    Temp    Pulse    Resp    SpO2      Last Pain:  Vitals:   09/26/21 0728  TempSrc: Temporal  PainSc: 0-No pain         Complications: No notable events documented.

## 2021-09-26 NOTE — Anesthesia Preprocedure Evaluation (Signed)
Anesthesia Evaluation  Patient identified by MRN, date of birth, ID band Patient awake    Reviewed: Allergy & Precautions, NPO status , Patient's Chart, lab work & pertinent test results  History of Anesthesia Complications (+) PONV, DIFFICULT AIRWAY and history of anesthetic complications (Small AW)  Airway Mallampati: II  TM Distance: >3 FB Neck ROM: Limited  Mouth opening: Limited Mouth Opening  Dental  (+) Teeth Intact, Dental Advidsory Given   Pulmonary shortness of breath, asthma , neg sleep apnea, neg recent URI,    Pulmonary exam normal breath sounds clear to auscultation       Cardiovascular Exercise Tolerance: Good METS: 3 - Mets hypertension, Pt. on medications (-) angina(-) Past MI and (-) Cardiac Stents Normal cardiovascular exam(-) dysrhythmias (-) Valvular Problems/Murmurs Rhythm:Regular Rate:Normal     Neuro/Psych  Headaches, neg Seizures PSYCHIATRIC DISORDERS Anxiety Depression    GI/Hepatic Neg liver ROS, GERD  Medicated and Controlled,  Endo/Other  Hypothyroidism   Renal/GU negative Renal ROS  negative genitourinary   Musculoskeletal  (+) Arthritis ,   Abdominal   Peds  Hematology negative hematology ROS (+)   Anesthesia Other Findings Past Medical History: No date: Anxiety     Comment:  takes alprazolam - rare use  No date: Arthritis     Comment:  cervical spondylosis  No date: Asthma No date: Balance problem No date: Brain cyst No date: Cervical fusion syndrome No date: Complete rupture of rotator cuff No date: Complication of anesthesia     Comment:  lung collapsed after neck fusion No date: Depression No date: Difficult intubation     Comment:  Small mouth opening, limited neck flexion, very anterior 08/10/2018: Flu     Comment:  tested positive No date: GERD (gastroesophageal reflux disease)     Comment:  pt. reports that its better, no meds in use at this               time-  2015 No date: Headache No date: Herpes No date: History of kidney stones No date: History of pneumonia No date: Hypertension     Comment:  pt. doesn't see cardiologist, followed for HTN by Dr.               Gerarda Fraction No date: Hypothyroidism No date: Inflammation of shoulder joint No date: Memory difficulties No date: Neuromuscular disorder [facility])     Comment:  joint and muscle problems No date: Occipital neuralgia No date: Pneumothorax on right     Comment:  following C4-6 ACDF 07/04/16 No date: Pseudoarthrosis of cervical spine (HCC) No date: Recurrent falls No date: Syncope No date: Urinary frequency   Reproductive/Obstetrics negative OB ROS                             Anesthesia Physical  Anesthesia Plan  ASA: 2  Anesthesia Plan: General   Post-op Pain Management:    Induction: Intravenous  PONV Risk Score and Plan: Ondansetron, Dexamethasone, Propofol infusion, TIVA and Treatment may vary due to age or medical condition  Airway Management Planned: LMA and Oral ETT  Additional Equipment:   Intra-op Plan:   Post-operative Plan: Extubation in OR  Informed Consent: I have reviewed the patients History and Physical, chart, labs and discussed the procedure including the risks, benefits and alternatives for the proposed anesthesia with the patient or authorized representative who has indicated his/her understanding and acceptance.     Dental Advisory Given  Plan  Discussed with: Anesthesiologist, CRNA and Surgeon  Anesthesia Plan Comments: (Patient consented for risks of anesthesia including but not limited to:  - adverse reactions to medications - damage to eyes, teeth, lips or other oral mucosa - nerve damage due to positioning  - sore throat or hoarseness - Damage to heart, brain, nerves, lungs, other parts of body or loss of life  Patient voiced understanding.)        Anesthesia Quick Evaluation

## 2021-09-26 NOTE — Op Note (Signed)
Preoperative diagnosis:  Pelvic pain Storage related voiding symptoms Microhematuria  Postoperative diagnosis:  Same  Procedure: Cystoscopy with bilateral retrograde pyelograms Hydrodistention bladder Instillation intravesical Marcaine  Surgeon: Abbie Sons, MD  Anesthesia: General  Complications: None  Intraoperative findings:  1.  Cystoscopy-predilation bladder mucosa normal in appearance without erythema, solid or papillary lesions.  Urethra normal in appearance.  UOs normal-appearing with clear efflux. Post dilation glomerulations were noted lower lateral and posterior walls/trigone.  No ulceration  2.  Retrograde pyelograms: Right ureter normal caliber without dilation or filling defect.  Renal pelvis and collecting system normal in appearance.  Left ureter normal caliber without dilation or filling defect.  Renal pelvis and collecting system normal in appearance  EBL: Minimal  Specimens: None  Indication: Kimberly [name] is a 61 y.o. female with pelvic pain, storage elated voiding symptoms and microhematuria.  After reviewing the management options for treatment, he elected to proceed with the above surgical procedure(s). We have discussed the potential benefits and risks of the procedure, side effects of the proposed treatment, the likelihood of the patient achieving the goals of the procedure, and any potential problems that might occur during the procedure or recuperation. Informed consent has been obtained.  Description of procedure:  The patient was taken to the operating room and general anesthesia was induced.  The patient was placed in the dorsal lithotomy position, prepped and draped in the usual sterile fashion, and preoperative antibiotics were administered. A preoperative time-out was performed.   A 21 French cystoscope with obturator was lubricated and passed per urethra.  The third degree lens was placed into the sheath and panendoscopy was performed with  findings as described above.  A 6 French open-ended ureteral catheter was placed through the cystoscope and just inside the right UO.  Contrast was instilled and fluoroscopy was performed with findings described above.  An identical procedure was performed on the contralateral side.  The bladder was then emptied and distended to equilibrium under 80 cm H2O pressure.  He remained distended for 5 minutes then drained with a volume of 450 mL obtained.  Repeat cystoscopy was performed with findings as described above.  The bladder was then redistended in a similar fashion then drained with 450 mL of water obtained.  30 cc of 0.25% plain Marcaine was then instilled into the bladder and the cystoscope was removed.  Plan: Postop follow-up 1 month   Abbie Sons, M.D.

## 2021-09-26 NOTE — Discharge Instructions (Signed)
AMBULATORY SURGERY  ?DISCHARGE INSTRUCTIONS ? ? ?The drugs that you were given will stay in your system until tomorrow so for the next 24 hours you should not: ? ?Drive an automobile ?Make any legal decisions ?Drink any alcoholic beverage ? ? ?You may resume regular meals tomorrow.  Today it is better to start with liquids and gradually work up to solid foods. ? ?You may eat anything you prefer, but it is better to start with liquids, then soup and crackers, and gradually work up to solid foods. ? ? ?Please notify your doctor immediately if you have any unusual bleeding, trouble breathing, redness and pain at the surgery site, drainage, fever, or pain not relieved by medication. ?  ? ?Additional Instructions:  ?

## 2021-09-26 NOTE — Anesthesia Postprocedure Evaluation (Signed)
Anesthesia Post Note  Patient: Kimberly [name]  Procedure(s) Performed: CYSTOSCOPY/HYDRODISTENSION CYSTOSCOPY WITH RETROGRADE PYELOGRAM (Bilateral) CYSTOSCOPY WITH BIOPSY URETEROSCOPY (Bilateral)  Patient location during evaluation: PACU Anesthesia Type: General Level of consciousness: awake and alert Pain management: pain level controlled Vital Signs Assessment: post-procedure vital signs reviewed and stable Respiratory status: spontaneous breathing, nonlabored ventilation, respiratory function stable and patient connected to nasal cannula oxygen Cardiovascular status: blood pressure returned to baseline and stable Postop Assessment: no apparent nausea or vomiting Anesthetic complications: no   No notable events documented.   Last Vitals:  Vitals:   09/26/21 1030 09/26/21 1054  BP: 111/87 128/90  Pulse: (!) 103 100  Resp: 17 16  Temp:  (!) 36.3 C  SpO2: 91% 94%    Last Pain:  Vitals:   09/26/21 1054  TempSrc: Temporal  PainSc: 0-No pain                 Martha Clan

## 2021-09-26 NOTE — Anesthesia Procedure Notes (Signed)
Procedure Name: LMA Insertion Date/Time: 09/26/2021 9:11 AM Performed by: Beverely Low, CRNA Pre-anesthesia Checklist: Patient identified, Patient being monitored, Timeout performed, Emergency Drugs available and Suction available Patient Re-evaluated:Patient Re-evaluated prior to induction Oxygen Delivery Method: Circle system utilized Preoxygenation: Pre-oxygenation with 100% oxygen Induction Type: IV induction Ventilation: Mask ventilation without difficulty LMA: LMA inserted LMA Size: 3.5 Tube type: Oral Number of attempts: 1 Placement Confirmation: positive ETCO2 and breath sounds checked- equal and bilateral Tube secured with: Tape Dental Injury: Teeth and Oropharynx as per pre-operative assessment

## 2021-09-26 NOTE — Interval H&P Note (Signed)
History and Physical Interval Note:  CV:RRR Lungs:clear  09/26/2021 8:48 AM  Kimberly [name]  has presented today for surgery, with the diagnosis of Pelvic Pain, Microhematuria.  The various methods of treatment have been discussed with the patient and family. After consideration of risks, benefits and other options for treatment, the patient has consented to  Procedure(s): CYSTOSCOPY/HYDRODISTENSION (N/A) CYSTOSCOPY WITH RETROGRADE PYELOGRAM (Bilateral) CYSTOSCOPY WITH BIOPSY (N/A) URETEROSCOPY (Bilateral) as a surgical intervention.  The patient's history has been reviewed, patient examined, no change in status, stable for surgery.  I have reviewed the patient's chart and labs.  Questions were answered to the patient's satisfaction.     [place]

## 2021-09-29 ENCOUNTER — Telehealth: Payer: Self-pay | Admitting: *Deleted

## 2021-09-29 NOTE — Telephone Encounter (Signed)
Patient called triage line concerned with pain when voiding. States she cannot take oxybutynin, she notices the pain when she has been taking it since her procedure. She did not fill Uribel due to the costs. She was recently diagnosed with Covid. Denies any other urinary symptoms.  ?

## 2021-09-29 NOTE — Telephone Encounter (Signed)
Notified patient as instructed, She will try AZo first .  ? ?

## 2021-09-29 NOTE — Telephone Encounter (Signed)
She can use Azo for burning.  It looks like she can get the Uribel at [facility] for [phone] with a good Rx discount if she would like the prescription sent there ?

## 2021-10-05 ENCOUNTER — Other Ambulatory Visit: Payer: Self-pay | Admitting: *Deleted

## 2021-10-05 MED ORDER — URIBEL 118 MG PO CAPS: ORAL_CAPSULE | ORAL | 0 refills | Status: DC

## 2021-10-25 ENCOUNTER — Ambulatory Visit (INDEPENDENT_AMBULATORY_CARE_PROVIDER_SITE_OTHER): Payer: Medicare Other | Admitting: Urology

## 2021-10-25 ENCOUNTER — Encounter: Payer: Self-pay | Admitting: Urology

## 2021-10-25 VITALS — BP 104/71 | HR 94 | Ht 61.0 in | Wt 143.0 lb

## 2021-10-25 DIAGNOSIS — R3129 Other microscopic hematuria: Secondary | ICD-10-CM

## 2021-10-25 DIAGNOSIS — R102 Pelvic and perineal pain: Secondary | ICD-10-CM

## 2021-10-25 DIAGNOSIS — R399 Unspecified symptoms and signs involving the genitourinary system: Secondary | ICD-10-CM | POA: Diagnosis not present

## 2021-10-25 MED ORDER — AMITRIPTYLINE HCL 25 MG PO TABS: 25.0000 mg | ORAL_TABLET | Freq: Every day | ORAL | 1 refills | Status: DC

## 2021-10-25 MED ORDER — SOLIFENACIN SUCCINATE 5 MG PO TABS: 5.0000 mg | ORAL_TABLET | Freq: Every day | ORAL | 1 refills | Status: DC

## 2021-10-25 MED ORDER — HYDROXYZINE HCL 10 MG PO TABS: 10.0000 mg | ORAL_TABLET | Freq: Three times a day (TID) | ORAL | 5 refills | Status: DC | PRN

## 2021-10-25 NOTE — Progress Notes (Signed)
? ?10/25/2021 ?1:51 PM  ? ?Kimberly [name] ?August 02, 1960 ?[id number] ? ?Referring provider: Dion Body, MD ?[place] ?[facility] ?[place],  [place] [address] ? ?Chief Complaint  ?Patient presents with  ? Follow-up  ? ? ?HPI: ?61 y.o. female presents for postop follow-up. ? ?Cystoscopy with hydrodistention and bilateral retrograde pyelograms 09/26/2021 for pelvic pain, storage related voiding symptoms and microhematuria ?No mucosal abnormalities noted ?Bladder volume under anesthesia only 450 mL ?No postoperative problems ?Feels voiding symptoms may have improved slightly ? ? ?PMH: ?Past Medical History:  ?Diagnosis Date  ? Anxiety   ? takes alprazolam - rare use   ? Arthritis   ? cervical spondylosis   ? Asthma   ? Balance problem   ? Brain cyst   ? Cervical fusion syndrome   ? Complete rupture of rotator cuff   ? Complication of anesthesia   ? lung collapsed after neck fusion  ? Depression   ? Difficult intubation   ? Small mouth opening, limited neck flexion, very anterior   ? Flu 08/10/2018  ? tested positive  ? GERD (gastroesophageal reflux disease)   ? pt. reports that its better, no meds in use at this time- 2015  ? Headache   ? Herpes   ? History of kidney stones   ? History of pneumonia   ? Hypertension   ? pt. doesn't see cardiologist, followed for HTN by Dr. Gerarda Fraction  ? Hypothyroidism   ? Inflammation of shoulder joint   ? Memory difficulties   ? Neuromuscular disorder ([place])   ? joint and muscle problems  ? Occipital neuralgia   ? Pneumothorax on right   ? following C4-6 ACDF 07/04/16  ? Pseudoarthrosis of cervical spine ([place])   ? Recurrent falls   ? Syncope   ? Urinary frequency   ? ? ?Surgical History: ?Past Surgical History:  ?Procedure Laterality Date  ? ABDOMINAL HYSTERECTOMY    ? ANTERIOR CERVICAL DECOMP/DISCECTOMY FUSION N/A 08/06/2014  ? Procedure: ANTERIOR CERVICAL DECOMPRESSION/DISCECTOMY FUSION CERVICAL THREE-FOUR,CERVICAL SIX-SEVEN ,CERVICAL SEVEN-THORACIC ONE;  Surgeon:  Floyce Stakes, MD;  Location: [facility];  Service: Neurosurgery;  Laterality: N/A;  ? ANTERIOR CERVICAL DECOMP/DISCECTOMY FUSION N/A 07/04/2016  ? Procedure: CERVICAL FOUR-FIVE, CERVICAL FIVE-SIX ANTERIOR CERVICAL DECOMPRESSION/DISCECTOMY/FUSION WITH REVISION OF CERVICAL THREE-FOUR PLATE;  Surgeon: Leeroy Cha, MD;  Location: [facility];  Service: Neurosurgery;  Laterality: N/A;  ? CARPAL TUNNEL RELEASE Left 02/16/2013  ? Procedure: CARPAL TUNNEL RELEASE;  Surgeon: Carole Civil, MD;  Location: [facility];  Service: Orthopedics;  Laterality: Left;  ? CARPAL TUNNEL RELEASE Right 03/27/2013  ? Procedure: RIGHT CARPAL TUNNEL RELEASE;  Surgeon: Carole Civil, MD;  Location: [facility];  Service: Orthopedics;  Laterality: Right;  ? CESAREAN SECTION    ? x2  ? CHOLECYSTECTOMY N/A 06/17/2015  ? Procedure: LAPAROSCOPIC CHOLECYSTECTOMY;  Surgeon: Aviva Signs, MD;  Location: [facility];  Service: General;  Laterality: N/A;  ? COLONOSCOPY    ? COLONOSCOPY WITH PROPOFOL N/A 05/06/2019  ? Procedure: COLONOSCOPY WITH PROPOFOL;  Surgeon: Robert Bellow, MD;  Location: [facility];  Service: Endoscopy;  Laterality: N/A;  ? CYSTO WITH HYDRODISTENSION N/A 09/26/2021  ? Procedure: CYSTOSCOPY/HYDRODISTENSION;  Surgeon: Abbie Sons, MD;  Location: [facility];  Service: Urology;  Laterality: N/A;  ? CYSTOSCOPY W/ RETROGRADES Bilateral 09/26/2021  ? Procedure: CYSTOSCOPY WITH RETROGRADE PYELOGRAM;  Surgeon: Abbie Sons, MD;  Location: [facility];  Service: Urology;  Laterality: Bilateral;  ? CYSTOSCOPY WITH BIOPSY N/A 09/26/2021  ?  Procedure: CYSTOSCOPY WITH BIOPSY;  Surgeon: Abbie Sons, MD;  Location: [facility];  Service: Urology;  Laterality: N/A;  ? ESOPHAGOGASTRODUODENOSCOPY N/A 05/08/2021  ? Procedure: ESOPHAGOGASTRODUODENOSCOPY (EGD);  Surgeon: Lesly Rubenstein, MD;  Location: [facility]OSCOPY;  Service: Endoscopy;  Laterality: N/A;  ? EXTRACORPOREAL SHOCK WAVE LITHOTRIPSY Left 09/04/2018  ? Procedure: EXTRACORPOREAL SHOCK  WAVE LITHOTRIPSY (ESWL);  Surgeon: Billey Co, MD;  Location: [facility];  Service: Urology;  Laterality: Left;  ? FOREIGN BODY REMOVAL Left   ? knee-as child  ? KNEE ARTHROSCOPY    ? NASAL SINUS SURGERY N/A 04/13/2015  ? Procedure: nasal endoscopy with adenoid biopsy;  Surgeon: Ruby Cola, MD;  [facility];  Service: ENT;  Laterality: N/A;  ? NECK SURGERY  2016  ? x 3 all together  ? POSTERIOR CERVICAL FUSION/FORAMINOTOMY N/A 11/13/2016  ? Procedure: CERVICAL TWO-CERVICAL SIX POSTERIOR CERVICAL FUSION WITH LATERAL MASS FIXATION;  Surgeon: Kristeen Miss, MD;  Location: [place];  Service: Neurosurgery;  Laterality: N/A;  posterior approach  ? REDUCTION MAMMAPLASTY    ? ROTATOR CUFF REPAIR Left   ? SHOULDER ACROMIOPLASTY Left 05/18/2015  ? Procedure: SHOULDER ACROMIOPLASTY;  Surgeon: Earlie Server, MD;  Location: [facility];  Service: Orthopedics;  Laterality: Left;  ? SHOULDER ARTHROSCOPY WITH DISTAL CLAVICLE RESECTION Left 05/18/2015  ? Procedure: LEFT SHOULDER ARTHROSCOPY WITH  DISTAL CLAVICLE RESECTION;  Surgeon: Earlie Server, MD;  Location: [facility];  Service: Orthopedics;  Laterality: Left;  ? URETEROSCOPY Bilateral 09/26/2021  ? Procedure: URETEROSCOPY;  Surgeon: Abbie Sons, MD;  Location: [facility];  Service: Urology;  Laterality: Bilateral;  ? ? ?Home Medications:  ?Allergies as of 10/25/2021   ? ?   Reactions  ? Shellfish Allergy Anaphylaxis, Hives  ? "Swells throat up"  ? Peanut-containing Drug Products Hives  ? Codeine Other (See Comments)  ? jitters  ? Diphtheria Toxoid Rash  ? Tetanus Toxoids Rash  ? ?  ? ?  ?Medication List  ?  ? ?  ? Accurate as of October 25, 2021  1:51 PM. If you have any questions, ask your nurse or doctor.  ?  ?  ? ?  ? ?acetaminophen 500 MG tablet ?Commonly known as: TYLENOL ?Take 500 mg by mouth 2 (two) times daily as needed for headache. ?  ?acyclovir 400 MG tablet ?Commonly known as: ZOVIRAX ?Take 400 mg by mouth 2 (two) times daily. ?   ?amLODipine 5 MG tablet ?Commonly known as: NORVASC ?Take 5 mg by mouth every morning. ?  ?cholecalciferol 25 MCG (1000 UNIT) tablet ?Commonly known as: VITAMIN D3 ?Take 1,000 Units by mouth daily. ?  ?estradiol 1 MG tablet ?Commonly known as: ESTRACE ?Take 1.5 mg by mouth daily. ?  ?gabapentin 400 MG capsule ?Commonly known as: NEURONTIN ?Take 1 capsule (400 mg total) by mouth 3 (three) times daily. ?  ?liothyronine 5 MCG tablet ?Commonly known as: CYTOMEL ?Take 5 mcg by mouth every morning. ?  ?NONFORMULARY OR COMPOUNDED ITEM ?Place 10 mg vaginally at bedtime as needed (spasms). Diazepam 10 mg ?  ?oxybutynin 5 MG tablet ?Commonly known as: DITROPAN ?1 tab tid prn frequency,urgency, bladder spasm ?  ?Oxycodone HCl 10 MG Tabs ?Take 1 tablet (10 mg total) by mouth every 8 (eight) hours as needed (pain). ?  ?Oxycodone HCl 10 MG Tabs ?Take 1 tablet (10 mg total) by mouth every 8 (eight) hours as needed (pain). ?Start taking on: November 20, 2021 ?  ?pantoprazole 40 MG tablet ?  Commonly known as: PROTONIX ?Take 40 mg by mouth every morning. ?  ?rizatriptan 10 MG tablet ?Commonly known as: MAXALT ?Take 10 mg by mouth as needed for migraine. May repeat in 2 hours if needed ?  ?senna-docusate 8.6-50 MG tablet ?Commonly known as: Senokot-S ?Take 1 tablet by mouth 2 (two) times daily. ?What changed:  ?when to take this ?reasons to take this ?  ?Uribel 118 MG Caps ?Take 1 capsule (118 mg total) by mouth 3 (three) times daily as needed (Urinary frequency, urgency, burning). ?  ?Uribel 118 MG Caps ?Take 1 capsule (118 mg total) by mouth 3 (three) times daily as needed (Urinary frequency, urgency, burning ?  ?[place] [facility] ?Apply 1 drop to eye daily as needed (irritation). ?  ? ?  ? ? ?Allergies:  ?Allergies  ?Allergen Reactions  ? Shellfish Allergy Anaphylaxis and Hives  ?  "Swells throat up"  ? Peanut-Containing Drug Products Hives  ? Codeine Other (See Comments)  ?  jitters  ? Diphtheria Toxoid Rash  ? Tetanus Toxoids Rash   ? ? ?Family History: ?Family History  ?Problem Relation Age of Onset  ? Cancer Father   ?     prostate cancer  ? Hypertension Father   ? Hypertension Other   ? Breast cancer Neg Hx   ? ? ?Social History:  reports tha

## 2021-11-08 ENCOUNTER — Other Ambulatory Visit: Payer: Self-pay | Admitting: Urology

## 2021-11-13 ENCOUNTER — Telehealth: Payer: Self-pay | Admitting: Student in an Organized Health Care Education/Training Program

## 2021-11-13 NOTE — Telephone Encounter (Signed)
Informed patient of above.  ?

## 2021-11-16 ENCOUNTER — Inpatient Hospital Stay: Payer: Medicare Other | Attending: Internal Medicine

## 2021-11-16 ENCOUNTER — Other Ambulatory Visit: Payer: Self-pay

## 2021-11-16 ENCOUNTER — Ambulatory Visit
Admission: RE | Admit: 2021-11-16 | Discharge: 2021-11-16 | Disposition: A | Discharge status: Home / Self Care | Payer: Medicare Other | Source: Ambulatory Visit | Attending: Internal Medicine | Admitting: Internal Medicine

## 2021-11-16 DIAGNOSIS — R599 Enlarged lymph nodes, unspecified: Secondary | ICD-10-CM | POA: Diagnosis present

## 2021-11-16 LAB — POCT I-STAT CREATININE: Creatinine, Ser: 0.8 mg/dL (ref 0.44–1.00)

## 2021-11-16 MED ORDER — IOHEXOL 300 MG/ML  SOLN
85.0000 mL | Freq: Once | INTRAMUSCULAR | Status: AC | PRN
  Administered 2021-11-16: 85 mL via INTRAVENOUS

## 2021-11-17 ENCOUNTER — Telehealth: Payer: Self-pay | Admitting: Student in an Organized Health Care Education/Training Program

## 2021-11-17 NOTE — Telephone Encounter (Signed)
Patient advised that no medication changes will be made outside of appt. Offered appt for next week. ?

## 2021-11-20 ENCOUNTER — Inpatient Hospital Stay: Payer: Medicare Other | Admitting: Internal Medicine

## 2021-11-21 ENCOUNTER — Encounter: Payer: Self-pay | Admitting: Student in an Organized Health Care Education/Training Program

## 2021-11-21 ENCOUNTER — Ambulatory Visit
Payer: Medicare Other | Attending: Student in an Organized Health Care Education/Training Program | Admitting: Student in an Organized Health Care Education/Training Program

## 2021-11-21 VITALS — BP 129/89 | HR 98 | Temp 97.1°F | Resp 16 | Ht 61.0 in | Wt 138.0 lb

## 2021-11-21 DIAGNOSIS — M7918 Myalgia, other site: Secondary | ICD-10-CM | POA: Diagnosis present

## 2021-11-21 DIAGNOSIS — M5481 Occipital neuralgia: Secondary | ICD-10-CM | POA: Diagnosis present

## 2021-11-21 DIAGNOSIS — Z9889 Other specified postprocedural states: Secondary | ICD-10-CM | POA: Insufficient documentation

## 2021-11-21 DIAGNOSIS — R519 Headache, unspecified: Secondary | ICD-10-CM | POA: Insufficient documentation

## 2021-11-21 DIAGNOSIS — G894 Chronic pain syndrome: Secondary | ICD-10-CM | POA: Diagnosis present

## 2021-11-21 DIAGNOSIS — M5030 Other cervical disc degeneration, unspecified cervical region: Secondary | ICD-10-CM | POA: Insufficient documentation

## 2021-11-21 DIAGNOSIS — M47812 Spondylosis without myelopathy or radiculopathy, cervical region: Secondary | ICD-10-CM | POA: Diagnosis present

## 2021-11-21 DIAGNOSIS — Q761 Klippel-Feil syndrome: Secondary | ICD-10-CM | POA: Diagnosis present

## 2021-11-21 MED ORDER — OXYCODONE HCL 10 MG PO TABS: 10.0000 mg | ORAL_TABLET | Freq: Three times a day (TID) | ORAL | 0 refills | Status: DC | PRN

## 2021-11-21 MED ORDER — GABAPENTIN 600 MG PO TABS: 600.0000 mg | ORAL_TABLET | Freq: Three times a day (TID) | ORAL | 2 refills | Status: DC

## 2021-11-21 NOTE — Progress Notes (Signed)
Nursing Pain Medication Assessment:  ?Safety precautions to be maintained throughout the outpatient stay will include: orient to surroundings, keep bed in low position, maintain call bell within reach at all times, provide assistance with transfer out of bed and ambulation.  ?Medication Inspection Compliance: Kimberly [name] did not comply with our request to bring her pills to be counted. She was reminded that bringing the medication bottles, even when empty, is a requirement. ? ?Medication: None brought in. ?Pill/Patch Count: None available to be counted. ?Bottle Appearance: No container available. Did not bring bottle(s) to appointment. ?Filled Date: N/A ?Last Medication intake:  Ran out of medicine more than 48 hours ago. Ran out 9 days ago because has been taking more that prescribed. Has not filled script dated 11-20-21 because of possibility of getting new script today. ?

## 2021-11-21 NOTE — Progress Notes (Signed)
PROVIDER NOTE: Information contained herein reflects review and annotations entered in association with encounter. Interpretation of such information and data should be left to medically-trained personnel. Information provided to patient can be located elsewhere in the medical record under "Patient Instructions". Document created using STT-dictation technology, any transcriptional errors that may result from process are unintentional.  ?  ?Patient: Kimberly [name]  Service Category: E/M  Provider: Gillis Santa, MD  ?DOB: April 01, 1961  DOS: 11/21/2021  Specialty: Interventional Pain Management  ?MRN: [id number]  Setting: Ambulatory outpatient  PCP: Dion Body, MD  ?Type: Established Patient    Referring Provider: Dion Body, MD  ?Location: Office  Delivery: Face-to-face    ? ?HPI  ?[name], a 61 y.o. year old female, is here today because of her Cervical spondylosis without myelopathy [M47.812]. [name] primary complain today is Headache (neck) ? ?Last encounter: My last encounter with her was on 11/21/21 ? ?Pertinent problems: [name] has SHOULDER PAIN; IMPINGEMENT SYNDROME; RUPTURE ROTATOR CUFF; CTS (carpal tunnel syndrome); Cervical spondylosis without myelopathy; Cervical stenosis of spinal canal; Pseudoarthrosis of cervical spine (Kimberly [name]); Numbness and tingling; Vitamin D deficiency; Cervical fusion syndrome; Hx of cervical spine surgery; Chronic pain syndrome; Cervicalgia; Controlled substance agreement signed; Depression, major, single episode, complete remission ([place]); Cervical myofascial pain syndrome; and Occipital headache on their pertinent problem list. ?Pain Assessment: Severity of Chronic pain is reported as a 8 /10. Location: Head  /neck. Onset: More than a month ago. Quality: Throbbing. Timing: Intermittent. Modifying factor(s): medication, heat. ?Vitals:  height is '5\' 1"'$  (1.549 m) and weight is 138 lb (62.6 kg). Her temporal temperature is 97.1 ?F (36.2 ?C) (abnormal).  Her blood pressure is 129/89 and her pulse is 98. Her respiration is 16 and oxygen saturation is 100%.  ? ?Reason for encounter: medication management.   ? ?Patient has been dealing with increased occipital and cervical spine pain.  She has been overtaking her medications specifically her oxycodone which she is taking 20 mg in the morning and 20 mg in the evening.  I informed her that her maximum daily dose is 30 mg and to not exceed that.  We discussed other therapeutic options to avoid opioid escalation in these include escalation of her gabapentin to 600 mg 3 times daily from 400 mg 3 times daily.  Patient continues on amitriptyline 25 mg nightly.  She does not have trouble with sleep. ? ?Patient is not having any swelling of her lower extremity.  She was cautioned on the risk of lower extremity swelling, drowsiness, sedation with higher doses of gabapentin. ? ? ?Pharmacotherapy Assessment  ?Analgesic: Oxycodone 10 mg every 8 hours as needed, quantity 90/month; MME equals 45   ? ?Monitoring: ?[place] PMP: PDMP reviewed during this encounter.       ?Pharmacotherapy: No side-effects or adverse reactions reported. ?Compliance: No problems identified. ?Effectiveness: Clinically acceptable.  UDS:  ?Summary  ?Date Value Ref Range Status  ?02/08/2021 Note  Final  ?  Comment:  ?  ==================================================================== ?[employer] Select 13 (MW) ?==================================================================== ?Test                             Result       Flag       Units ? ?Drug Present and Declared for Prescription Verification ?  Oxycodone                      1168  EXPECTED   ng/mg creat ?  Oxymorphone                    2167         EXPECTED   ng/mg creat ?  Noroxycodone                   1622         EXPECTED   ng/mg creat ?  Noroxymorphone                 560          EXPECTED   ng/mg creat ?   Sources of oxycodone are scheduled prescription medications. ?   Oxymorphone,  noroxycodone, and noroxymorphone are expected ?   metabolites of oxycodone. Oxymorphone is also available as a ?   scheduled prescription medication. ? ?Drug Absent but Declared for Prescription Verification ?  Diazepam                       Not Detected UNEXPECTED ng/mg creat ?  Alprazolam                     Not Detected UNEXPECTED ng/mg creat ?==================================================================== ?Test                      Result    Flag   Units      Ref Range ?  Creatinine              183              mg/dL      >=20 ?==================================================================== ?Declared Medications: ? The flagging and interpretation on this report are based on the ? following declared medications.  Unexpected results may arise from ? inaccuracies in the declared medications. ? ? **Note: The testing scope of this panel includes these medications: ? ? Alprazolam (Xanax) ? Diazepam (Valium) ? Oxycodone ? ? **Note: The testing scope of this panel does not include the ? following reported medications: ? ? Acetaminophen (Tylenol) ? Acyclovir ? Amlodipine ? Cholecalciferol ? Duloxetine (Cymbalta) ? Escitalopram (Lexapro) ? Estradiol (Estrace) ? Eye Drop ? Gabapentin ? Liothyronine (Cytomel) ? Nortriptyline (Pamelor) ? Nystatin ? Pantoprazole (Protonix) ?==================================================================== ?For clinical consultation, please call [phone]. ?==================================================================== ?  ?  ? ? ?ROS  ?Constitutional: Denies any fever or chills +headaches, occipital dominant ?Gastrointestinal: No reported hemesis, hematochezia, vomiting, or acute GI distress ?Musculoskeletal: Denies any acute onset joint swelling, redness, loss of ROM, or weakness ?Neurological: No reported episodes of acute onset apraxia, aphasia, dysarthria, agnosia, amnesia, paralysis, loss of coordination, or loss of consciousness ? ?Medication Review   ?NONFORMULARY OR COMPOUNDED ITEM, Oxycodone HCl, Tetrahydrozoline HCl, acetaminophen, acyclovir, amLODipine, amitriptyline, cholecalciferol, estradiol, gabapentin, hydrOXYzine, liothyronine, pantoprazole, rizatriptan, and solifenacin ? ?History Review  ?Allergy: Ms. Millar is allergic to shellfish allergy, peanut-containing drug products, codeine, diphtheria toxoid, and tetanus toxoids. ?Drug: Ms. Hertenstein  reports no history of drug use. ?Alcohol:  reports current alcohol use. ?Tobacco:  reports that she has never smoked. She has never used smokeless tobacco. ?Social: Ms. Cochrane  reports that she has never smoked. She has never used smokeless tobacco. She reports current alcohol use. She reports that she does not use drugs. ?Medical:  has a past medical history of Anxiety, Arthritis, Asthma, Balance problem, Brain cyst, Cervical fusion syndrome, Complete rupture of rotator cuff, Complication of anesthesia, Depression, Difficult intubation, Flu (08/10/2018), GERD (  gastroesophageal reflux disease), Headache, Herpes, History of kidney stones, History of pneumonia, Hypertension, Hypothyroidism, Inflammation of shoulder joint, Memory difficulties, Neuromuscular disorder ([place]), Occipital neuralgia, Pneumothorax on right, Pseudoarthrosis of cervical spine ([place]), Recurrent falls, Syncope, and Urinary frequency. ?Surgical: Ms. Swim  has a past surgical history that includes Abdominal hysterectomy; Rotator cuff repair (Left); Foreign Body Removal (Left); Carpal tunnel release (Left, 02/16/2013); Carpal tunnel release (Right, 03/27/2013); Cesarean section; Anterior cervical decomp/discectomy fusion (N/A, 08/06/2014); Colonoscopy; Nasal sinus surgery (N/A, 04/13/2015); Shoulder arthroscopy with distal clavicle resection (Left, 05/18/2015); Shoulder acromioplasty (Left, 05/18/2015); Cholecystectomy (N/A, 06/17/2015); Neck surgery (2016); Anterior cervical decomp/discectomy fusion (N/A, 07/04/2016); Posterior cervical  fusion/foraminotomy (N/A, 11/13/2016); Knee arthroscopy; Extracorporeal shock wave lithotripsy (Left, 09/04/2018); Colonoscopy with propofol (N/A, 05/06/2019); Reduction mammaplasty; Esophagogastroduodenoscopy (N/A, 05/08/2021); cysto

## 2021-11-27 ENCOUNTER — Encounter: Payer: Self-pay | Admitting: Internal Medicine

## 2021-11-27 ENCOUNTER — Inpatient Hospital Stay: Payer: Medicare Other | Attending: Internal Medicine | Admitting: Internal Medicine

## 2021-11-27 DIAGNOSIS — Z79899 Other long term (current) drug therapy: Secondary | ICD-10-CM | POA: Insufficient documentation

## 2021-11-27 DIAGNOSIS — R140 Abdominal distension (gaseous): Secondary | ICD-10-CM | POA: Insufficient documentation

## 2021-11-27 DIAGNOSIS — Z8042 Family history of malignant neoplasm of prostate: Secondary | ICD-10-CM | POA: Diagnosis not present

## 2021-11-27 DIAGNOSIS — D75839 Thrombocytosis, unspecified: Secondary | ICD-10-CM | POA: Diagnosis present

## 2021-11-27 DIAGNOSIS — R300 Dysuria: Secondary | ICD-10-CM | POA: Insufficient documentation

## 2021-11-27 DIAGNOSIS — R590 Localized enlarged lymph nodes: Secondary | ICD-10-CM | POA: Insufficient documentation

## 2021-11-27 NOTE — Progress Notes (Signed)
Patient states she is having pain when urinating. Patient states she is having burning with urination and without. Patient denies bleeding, discharge or foul odors. Patient states this has been a issue for the last month.  ?

## 2021-11-27 NOTE — Progress Notes (Signed)
[place] ?CONSULT NOTE ? ?Patient Care Team: ?Dion Body, MD as PCP - General (Family Medicine) ? ?CHIEF COMPLAINTS/PURPOSE OF CONSULTATION: Thrombocytosis. ? ? ?HEMATOLOGY HISTORY ? ?# THROMBOCYTOSIS [platelets- ; Hb; white count]; CT/US- ? ? ? ? ?HISTORY OF PRESENTING ILLNESS:  ?Kimberly [name] 61 y.o.  female pleasant patient is here for follow-up of retroperitoneal adenopathy which was incidentally found on CT scan in the hospital in January 2023. ? ?Patient denies any worsening abdominal pain.  Denies any nausea vomiting.  Does complain of abdominal bloating. ? ?Also complains of chronic dysuria chronic increased frequency of urination.  She has followed with urology.  No fever no chills.  No back pain. ? ?Review of Systems  ?Constitutional:  Negative for chills, diaphoresis, fever, malaise/fatigue and weight loss.  ?HENT:  Negative for nosebleeds and sore throat.   ?Eyes:  Negative for double vision.  ?Respiratory:  Negative for cough, hemoptysis, sputum production, shortness of breath and wheezing.   ?Cardiovascular:  Negative for chest pain, palpitations, orthopnea and leg swelling.  ?Gastrointestinal:  Negative for abdominal pain, blood in stool, constipation, diarrhea, heartburn, melena, nausea and vomiting.  ?Genitourinary:  Positive for dysuria, frequency and urgency.  ?Musculoskeletal:  Negative for back pain and joint pain.  ?Skin: Negative.  Negative for itching and rash.  ?Neurological:  Negative for dizziness, tingling, focal weakness, weakness and headaches.  ?Endo/Heme/Allergies:  Does not bruise/bleed easily.  ?Psychiatric/Behavioral:  Negative for depression. The patient is not nervous/anxious and does not have insomnia.   ? ? ?MEDICAL HISTORY:  ?Past Medical History:  ?Diagnosis Date  ?? Anxiety   ? takes alprazolam - rare use   ?? Arthritis   ? cervical spondylosis   ?? Asthma   ?? Balance problem   ?? Brain cyst   ?? Cervical fusion syndrome   ?? Complete rupture of  rotator cuff   ?? Complication of anesthesia   ? lung collapsed after neck fusion  ?? Depression   ?? Difficult intubation   ? Small mouth opening, limited neck flexion, very anterior   ?? Flu 08/10/2018  ? tested positive  ?? GERD (gastroesophageal reflux disease)   ? pt. reports that its better, no meds in use at this time- 2015  ?? Headache   ?? Herpes   ?? History of kidney stones   ?? History of pneumonia   ?? Hypertension   ? pt. doesn't see cardiologist, followed for HTN by Dr. Gerarda Fraction  ?? Hypothyroidism   ?? Inflammation of shoulder joint   ?? Memory difficulties   ?? Neuromuscular disorder ([place])   ? joint and muscle problems  ?? Occipital neuralgia   ?? Pneumothorax on right   ? following C4-6 ACDF 07/04/16  ?? Pseudoarthrosis of cervical spine ([place])   ?? Recurrent falls   ?? Syncope   ?? Urinary frequency   ? ? ?SURGICAL HISTORY: ?Past Surgical History:  ?Procedure Laterality Date  ?? ABDOMINAL HYSTERECTOMY    ?? ANTERIOR CERVICAL DECOMP/DISCECTOMY FUSION N/A 08/06/2014  ? Procedure: ANTERIOR CERVICAL DECOMPRESSION/DISCECTOMY FUSION CERVICAL THREE-FOUR,CERVICAL SIX-SEVEN ,CERVICAL SEVEN-THORACIC ONE;  Surgeon: Floyce Stakes, MD;  Location: [place];  Service: Neurosurgery;  Laterality: N/A;  ?? ANTERIOR CERVICAL DECOMP/DISCECTOMY FUSION N/A 07/04/2016  ? Procedure: CERVICAL FOUR-FIVE, CERVICAL FIVE-SIX ANTERIOR CERVICAL DECOMPRESSION/DISCECTOMY/FUSION WITH REVISION OF CERVICAL THREE-FOUR PLATE;  Surgeon: Leeroy Cha, MD;  Location: [facility];  Service: Neurosurgery;  Laterality: N/A;  ?? CARPAL TUNNEL RELEASE Left 02/16/2013  ? Procedure: CARPAL TUNNEL RELEASE;  Surgeon: Dorothyann Peng  Vela Prose, MD;  Location: [facility];  Service: Orthopedics;  Laterality: Left;  ?? CARPAL TUNNEL RELEASE Right 03/27/2013  ? Procedure: RIGHT CARPAL TUNNEL RELEASE;  Surgeon: Carole Civil, MD;  Location: [facility];  Service: Orthopedics;  Laterality: Right;  ?? CESAREAN SECTION    ? x2  ?? CHOLECYSTECTOMY N/A 06/17/2015  ? Procedure:  LAPAROSCOPIC CHOLECYSTECTOMY;  Surgeon: Aviva Signs, MD;  Location: [facility];  Service: General;  Laterality: N/A;  ?? COLONOSCOPY    ?? COLONOSCOPY WITH PROPOFOL N/A 05/06/2019  ? Procedure: COLONOSCOPY WITH PROPOFOL;  Surgeon: Robert Bellow, MD;  Location: [facility];  Service: Endoscopy;  Laterality: N/A;  ?? CYSTO WITH HYDRODISTENSION N/A 09/26/2021  ? Procedure: CYSTOSCOPY/HYDRODISTENSION;  Surgeon: Abbie Sons, MD;  Location: [facility];  Service: Urology;  Laterality: N/A;  ?? CYSTOSCOPY W/ RETROGRADES Bilateral 09/26/2021  ? Procedure: CYSTOSCOPY WITH RETROGRADE PYELOGRAM;  Surgeon: Abbie Sons, MD;  Location: [facility];  Service: Urology;  Laterality: Bilateral;  ?? CYSTOSCOPY WITH BIOPSY N/A 09/26/2021  ? Procedure: CYSTOSCOPY WITH BIOPSY;  Surgeon: Abbie Sons, MD;  Location: [facility];  Service: Urology;  Laterality: N/A;  ?? ESOPHAGOGASTRODUODENOSCOPY N/A 05/08/2021  ? Procedure: ESOPHAGOGASTRODUODENOSCOPY (EGD);  Surgeon: Lesly Rubenstein, MD;  Location[facility];  Service: Endoscopy;  Laterality: N/A;  ?? EXTRACORPOREAL SHOCK WAVE LITHOTRIPSY Left 09/04/2018  ? Procedure: EXTRACORPOREAL SHOCK WAVE LITHOTRIPSY (ESWL);  Surgeon: Billey Co, MD;  Location: [facility];  Service: Urology;  Laterality: Left;  ?? FOREIGN BODY REMOVAL Left   ? knee-as child  ?? KNEE ARTHROSCOPY    ?? NASAL SINUS SURGERY N/A 04/13/2015  ? Procedure: nasal endoscopy with adenoid biopsy;  Surgeon: Ruby Cola, MD;  Location: [place];  Service: ENT;  Laterality: N/A;  ?? NECK SURGERY  2016  ? x 3 all together  ?? POSTERIOR CERVICAL FUSION/FORAMINOTOMY N/A 11/13/2016  ? Procedure: CERVICAL TWO-CERVICAL SIX POSTERIOR CERVICAL FUSION WITH LATERAL MASS FIXATION;  Surgeon: Kristeen Miss, MD;  Location: [place];  Service: Neurosurgery;  Laterality: N/A;  posterior approach  ?? REDUCTION MAMMAPLASTY    ?? ROTATOR CUFF REPAIR Left   ?? SHOULDER ACROMIOPLASTY Left 05/18/2015  ? Procedure: SHOULDER ACROMIOPLASTY;   Surgeon: Earlie Server, MD;  Location: [place];  Service: Orthopedics;  Laterality: Left;  ?? SHOULDER ARTHROSCOPY WITH DISTAL CLAVICLE RESECTION Left 05/18/2015  ? Procedure: LEFT SHOULDER ARTHROSCOPY WITH  DISTAL CLAVICLE RESECTION;  Surgeon: Earlie Server, MD;  Location: [place];  Service: Orthopedics;  Laterality: Left;  ?? URETEROSCOPY Bilateral 09/26/2021  ? Procedure: URETEROSCOPY;  Surgeon: Abbie Sons, MD;  Location: [facility];  Service: Urology;  Laterality: Bilateral;  ? ? ?SOCIAL HISTORY: ?Social History  ? ?Socioeconomic History  ?? Marital status: Married  ?  Spouse name: Vernard Gambles  ?? Number of children: Not on file  ?? Years of education: Not on file  ?? Highest education level: Not on file  ?Occupational History  ?  Comment: disabled  ?Tobacco Use  ?? Smoking status: Never  ?? Smokeless tobacco: Never  ?Vaping Use  ?? Vaping Use: Never used  ?Substance and Sexual Activity  ?? Alcohol use: Yes  ?  Comment: occ  ?? Drug use: No  ?? Sexual activity: Yes  ?  Birth control/protection: Surgical  ?Other Topics Concern  ?? Not on file  ?Social History Narrative  ? Patient is disabled; no smoking no alcohol.  She takes care of her husband-who is on dialysis.  She has  2 grownup sons.  ? ?Social Determinants of Health  ? ?Financial Resource Strain: Not on file  ?Food Insecurity: Not on file  ?Transportation Needs: Not on file  ?Physical Activity: Not on file  ?Stress: Not on file  ?Social Connections: Not on file  ?Intimate Partner Violence: Not on file  ? ? ?FAMILY HISTORY: ?Family History  ?Problem Relation Age of Onset  ?? Cancer Father   ?     prostate cancer  ?? Hypertension Father   ?? Hypertension Other   ?? Breast cancer Neg Hx   ? ? ?ALLERGIES:  is allergic to shellfish allergy, peanut-containing drug products, codeine, diphtheria toxoid, and tetanus toxoids. ? ?MEDICATIONS:  ?Current Outpatient Medications  ?Medication Sig Dispense Refill  ?? acetaminophen  (TYLENOL) 500 MG tablet Take 500 mg by mouth 2 (two) times daily as needed for headache.    ?? acyclovir (ZOVIRAX) 400 MG tablet Take 400 mg by mouth 2 (two) times daily.    ?? amitriptyline (ELAVIL) 25 MG tablet

## 2021-11-27 NOTE — Assessment & Plan Note (Addendum)
?#  Incidental retroperitoneal adenopathy January 2023/hospitalization.  April 2023-  No acute abdominal or pelvic pathology.  LEFT adnexal cyst with thin septation, stable over time and previously evaluated with ultrasound. Compatible with benign process and not substantially changed dating back to 2013. ?? ?#Chronic dysuria: ?  Interstitial cystitis; status post urology evaluation.  Continue follow-up with Dr. Bernardo Heater. ? ?# DISPOSITION: ?# follow up as needed- Dr.B ?

## 2021-11-28 ENCOUNTER — Other Ambulatory Visit: Payer: Self-pay | Admitting: Obstetrics and Gynecology

## 2021-12-04 NOTE — H&P (Signed)
Kimberly [name] is a 61 y.o. female presenting with Pre Op Consulting ? on 12/04/2021 ?  ?History of Present Illness: ?Established patient returns today for discussion of oophorectomy after incidental finding of 4 cm ovarian cyst. She presented to her PCP 08/10/21 with worsening LLQ pain and was sent to the ED for further workup. CT of abdomen and pelvis revealed her cyst. She was treated for sepsis with IV ceftriaxone and flagyl. Repeat CT was done on the 13 th and 27th, cyst remained stable at 4.8 x 2.9 cm. See image results below. She has requested oophorectomy for removal of cyst and reassurance that it is not malignancy. With persistence and septation and size of her cyst I believe this is reasonable request.  ?  ?Pelvic Ultrasound 10/31/21  ?Transabd pelvic ?Hysterectomy ?Rt ovary removed ?Left ovary not definitely seen ?Lt complex septated adnexal cyst=4.5cm; septation=0.17cm ?No free fluid seen  ?  ? ?  ?08/25/21 CT Hematuria workup ?Reproductive: Status post hysterectomy. Unchanged, thinly septated  ?left adnexal cyst measuring 4.8 x 2.9 cm, stable in comparison to  ?examination dated 08/04/2018 and benign  ?  ?IMPRESSION:  ?1. Punctuate nonobstructive calculus of the superior pole of the  ?left kidney. No right-sided calculi, ureteral calculi, or  ?hydronephrosis.  ?2. No evidence of urinary tract mass or suspicious contrast  ?enhancement.  ?3. Limited opacification of the distal right ureter and proximal  ?left ureter. Within this limitation, no evidence of urinary tract  ?filling defect on delayed phase imaging.  ?4. No abnormality of the urinary bladder to explain bladder pain or  ?urinary frequency.  ?  ?08/10/21 CT Abdomen Pelvis w/ contrast ?IMPRESSION:  ?1. Generalized retroperitoneal stranding and inflammation, most  ?prominent at the level of the lower right kidney to the iliac  ?bifurcation. Source of inflammation is difficult to delineate.  ?Differential considerations include primary retroperitoneal   ?fibrosis, unspecified vasculitis, or reactive inflammation related  ?to the primary bowel process, although no definite focal inflamed  ?bowel process is seen as source. Consider short interval follow-up  ?CT to assess for change, consider administration of enteric contrast  ?for better bowel assessment.  ?2. Septated versus 2 adnexal cysts on the left measures 4.9 x 3.2  ?cm, chronic and not significantly changed from prior 2020 exam  ?allowing for differences in caliper placement, consistent with  ?benign process.  ?3. Small hiatal hernia.  ?4. Moderate colonic stool burden with colonic tortuosity, suggesting  ?constipation.  ?5. Shotty retroperitoneal and central mesenteric adenopathy is  ?likely reactive.  ?  ?  ?Pertinent Hx: ?-S/p TAH, RO in her 20's for fibroids  ?-C/S x2 ?-Hx of HSV, on acyclovir '400mg'$  BID for suppressive therapy x20 yrs ?-HTN ?-Hx of painful 3cm left ovarian cyst, recurrent since 2017 (when was visualized by TVUS) ?  ?Past Medical History:  has a past medical history of Bilateral carpal tunnel syndrome, Chronic headaches, Chronic neck and back pain, Depression (2015), GERD (gastroesophageal reflux disease), Hyperlipidemia (2008), and Hypertension (2000).  ?Past Surgical History:  has a past surgical history that includes Cholecystectomy; throat cyst removal (2016); Arthroscopic Rotator Cuff Repair (Left, 2014); shoulder surgery (Left, 2016); Endoscopic Carpal Tunnel Release (Bilateral, 2014); Anterior fusion cervical spine (2016); Hysterectomy; egd (11/25/2017); Upper gastrointestinal endoscopy; Lithotripsy (08/2018); Colonoscopy (05/06/2019); Breast surgery; and egd (05/08/2021). ?Family History: family history includes Alzheimer's disease in her mother; Diabetes type II in her father; High blood pressure (Hypertension) in her brother, father, maternal grandmother, mother, sister, and sister; Lung cancer in her  brother; No Known Problems in her maternal grandfather, paternal grandfather,  paternal grandmother, son, and son; Pneumonia in her father; Thyroid disease in her sister. ?Social History:  reports that she has never smoked. She has never used smokeless tobacco. She reports that she does not drink alcohol and does not use drugs. ?OB/GYN History:  ?OB History  ?   ?Gravida ?2 ?  ?Para ?2 ?  ?Term ?2 ?  ?Preterm ?  ?  ?AB ?  ?  ?Living ?2 ? ?   ?SAB ?  ?  ?IAB ?  ?  ?Ectopic ?  ?  ?Molar ?  ?  ?Multiple ?  ?  ?Live Births ?2 ? ?  ?  ?  ?Allergies: is allergic to peanut, shellfish containing products, tetanus and diphther. tox (pf), tetanus vaccines and toxoid, and codeine. ?Medications: ?  ?Current Outpatient Medications:  ?  acetaminophen (TYLENOL EXTRA STRENGTH ORAL), Take by mouth, Disp: , Rfl:  ?  acyclovir (ZOVIRAX) 400 MG tablet, Take 400 mg by mouth 2 (two) times daily, Disp: , Rfl:  ?  ALPRAZolam (XANAX) 0.5 MG tablet, Take 0.5 mg by mouth once daily as needed   , Disp: , Rfl:  ?  amitriptyline (ELAVIL) 25 MG tablet, Take 25 mg by mouth at bedtime, Disp: , Rfl:  ?  amLODIPine (NORVASC) 5 MG tablet, Take 1 tablet by mouth once daily, Disp: 90 tablet, Rfl: 0 ?  cholecalciferol (VITAMIN D3) 1000 unit tablet, Take 1,000 Units by mouth once daily.  , Disp: , Rfl:  ?  Compound Medication, Vaginal Valium 10 mg Use one suppository at night as needed for spasms, Disp: 30 each, Rfl: 0 ?  DULoxetine (CYMBALTA) 60 MG DR capsule, Take 1 capsule (60 mg total) by mouth once daily, Disp: 90 capsule, Rfl: 1 ?  estradioL (ESTRACE) 1 MG tablet, TAKE 1 AND 1/2 TABLETS BY  MOUTH ONCE DAILY, Disp: 135 tablet, Rfl: 3 ?  gabapentin (NEURONTIN) 300 MG capsule, Take 1 capsule (300 mg total) by mouth 3 (three) times daily, Disp: 90 capsule, Rfl: 0 ?  hydrOXYzine HCL (ATARAX) 10 MG tablet, Take 10 mg by mouth 3 (three) times daily as needed, Disp: , Rfl:  ?  ibuprofen (IBU ORAL), Take 400 mg by mouth once daily as needed  , Disp: , Rfl:  ?  liothyronine (CYTOMEL) 5 MCG tablet, TAKE 1 TABLET BY MOUTH ONCE DAILY,  Disp: 90 tablet, Rfl: 3 ?  meloxicam (MOBIC) 15 MG tablet, Take 1 tablet (15 mg total) by mouth daily with breakfast, Disp: 21 tablet, Rfl: 0 ?  NON FORMULARY, Place vaginally Vaginal valium, Disp: , Rfl:  ?  oxyBUTYnin (DITROPAN) 5 mg tablet, TAKE 1 TABLET BY MOUTH THREE TIMES DAILY AS NEEDED FOR FREQUENCY, URGENCY, BLADDER SPASM, Disp: , Rfl:  ?  oxyCODONE (ROXICODONE) 5 MG immediate release tablet, Take 5 mg by mouth every 6 (six) hours as needed, Disp: , Rfl:  ?  pantoprazole (PROTONIX) 40 MG DR tablet, TAKE 1 TABLET BY MOUTH ONCE DAILY, Disp: 90 tablet, Rfl: 3 ?  solifenacin (VESICARE) 5 MG tablet, Take 5 mg by mouth once daily, Disp: , Rfl:  ?  rizatriptan (MAXALT) 10 MG tablet, Take 10 mg by mouth once daily as needed MAY TAKE A SECOND DOSE AFTER 2 HOURS IF NEEDED (Patient not taking: Reported on 08/10/2021), Disp: , Rfl:  ?  valACYclovir (VALTREX) 1000 MG tablet, Take 1 tablet (1,000 mg total) by mouth once daily SuppressiveTherapy (Patient not taking:  Reported on 10/31/2021), Disp: 90 tablet, Rfl: 4 ?  ?Review of Systems: ?No SOB, no palpitations or chest pain, no new lower extremity edema, no nausea or vomiting or bowel or bladder complaints. See HPI for gyn specific ROS. ?  ? Exam: ?  ?BP 113/69   Pulse 70   Ht 154.9 cm ('5\' 1"'$ )   Wt 66.3 kg (146 lb 3.2 oz)   BMI 27.62 kg/m?  ?  ?General: Patient is well-groomed, well-nourished, appears stated age in no acute distress ?  ?HEENT: head is atraumatic and normocephalic, trachea is midline, neck is supple with no palpable nodules ?  ?CV: Regular rhythm and normal heart rate, no murmur ?  ?Pulm: Clear to auscultation throughout lung fields with no wheezing, crackles, or rhonchi. No increased work of breathing ?  ?Abdomen: soft , no mass, non-tender, no rebound tenderness, no hepatomegaly ?  ?Pelvic: deferred  ?  ?Impression: ?  ?The primary encounter diagnosis was Complex ovarian cyst. Diagnoses of Adnexal mass, Preop examination, and Vulvar cysts were also  pertinent to this visit. ?  ?Plan: ?  ?- Preop Exam, LO complex cyst, Vulvar cyst  ?-Patient returns for a preoperative discussion regarding her plans to proceed with surgical treatment of her complex ovarian cys

## 2021-12-13 ENCOUNTER — Other Ambulatory Visit: Payer: Self-pay | Admitting: Family Medicine

## 2021-12-13 DIAGNOSIS — N644 Mastodynia: Secondary | ICD-10-CM

## 2021-12-14 ENCOUNTER — Encounter
Admission: RE | Admit: 2021-12-14 | Discharge: 2021-12-14 | Disposition: A | Discharge status: Home / Self Care | Payer: Medicare Other | Source: Ambulatory Visit | Attending: Obstetrics and Gynecology | Admitting: Obstetrics and Gynecology

## 2021-12-14 HISTORY — DX: Carpal tunnel syndrome, bilateral upper limbs: G56.03

## 2021-12-14 HISTORY — DX: Other cervical disc degeneration, unspecified cervical region: M50.30

## 2021-12-14 HISTORY — DX: Hyperlipidemia, unspecified: E78.5

## 2021-12-14 NOTE — Patient Instructions (Addendum)
Your procedure is scheduled on: Friday, May 26 Report to the Registration Desk on the 1st floor of the [facility]. To find out your arrival time, please call [phone] between 1PM - 3PM on: Thursday, May 25 If your arrival time is 6:00 am, do not arrive prior to that time as the [place] entrance doors do not open until 6:00 am.  REMEMBER: Instructions that are not followed completely may result in serious medical risk, up to and including death; or upon the discretion of your surgeon and anesthesiologist your surgery may need to be rescheduled.  Do not eat food after midnight the night before surgery.  No gum chewing, lozengers or hard candies.  You may however, drink CLEAR liquids up to 2 hours before you are scheduled to arrive for your surgery. Do not drink anything within 2 hours of your scheduled arrival time.  Clear liquids include: - water  - apple juice without pulp - gatorade (not RED colors) - black coffee or tea (Do NOT add milk or creamers to the coffee or tea) Do NOT drink anything that is not on this list.  TAKE THESE MEDICATIONS THE MORNING OF SURGERY WITH A SIP OF WATER:  Amlodipine Gabapentin Liothyronine (Cytomel) Pantoprazole (Protonix) - (take one the night before and one on the morning of surgery - helps to prevent nausea after surgery.) Oxycodone if needed for pain  One week prior to surgery: starting May 19 Stop meloxicam and Anti-inflammatories (NSAIDS) such as Advil, Aleve, Ibuprofen, Motrin, Naproxen, Naprosyn and Aspirin based products such as Excedrin, Goodys Powder, BC Powder. Stop ANY OVER THE COUNTER supplements until after surgery. You may however, continue to take Tylenol if needed for pain up until the day of surgery.  No Alcohol for 24 hours before or after surgery.  No Smoking including e-cigarettes for 24 hours prior to surgery.  No chewable tobacco products for at least 6 hours prior to surgery.  No nicotine patches on the day of  surgery.  Do not use any "recreational" drugs for at least a week prior to your surgery.  Please be advised that the combination of cocaine and anesthesia may have negative outcomes, up to and including death. If you test positive for cocaine, your surgery will be cancelled.  On the morning of surgery brush your teeth with toothpaste and water, you may rinse your mouth with mouthwash if you wish. Do not swallow any toothpaste or mouthwash.  Use CHG Soap as directed on instruction sheet.  Do not wear jewelry, make-up, hairpins, clips or nail polish.  Do not wear lotions, powders, or perfumes.   Do not shave body from the neck down 48 hours prior to surgery just in case you cut yourself which could leave a site for infection.  Also, freshly shaved skin may become irritated if using the CHG soap.  Contact lenses, hearing aids and dentures may not be worn into surgery.  Do not bring valuables to the hospital. [facility] is not responsible for any missing/lost belongings or valuables.   Notify your doctor if there is any change in your medical condition (cold, fever, infection).  Wear comfortable clothing (specific to your surgery type) to the hospital.  After surgery, you can help prevent lung complications by doing breathing exercises.  Take deep breaths and cough every 1-2 hours. Your doctor may order a device called an Incentive Spirometer to help you take deep breaths. When coughing or sneezing, hold a pillow firmly against your incision with both  hands. This is called "splinting." Doing this helps protect your incision. It also decreases belly discomfort.  If you are being discharged the day of surgery, you will not be allowed to drive home. You will need a responsible adult (18 years or older) to drive you home and stay with you that night.   If you are taking public transportation, you will need to have a responsible adult (18 years or older) with you. Please confirm with your  physician that it is acceptable to use public transportation.   Please call the [facility] at [phone] if you have any questions about these instructions.  Surgery Visitation Policy:  Patients undergoing a surgery or procedure may have two family members or support persons with them as long as the person is not COVID-19 positive or experiencing its symptoms.    Preparing for Surgery with [place] (CHG) Soap  Before surgery, you can play an important role by reducing the number of germs on your skin.  CHG (Chlorhexidine gluconate) soap is an antiseptic cleanser which kills germs and bonds with the skin to continue killing germs even after washing.  Please do not use if you have an allergy to CHG or antibacterial soaps. If your skin becomes reddened/irritated stop using the CHG.  1. Shower the NIGHT BEFORE SURGERY and the MORNING OF SURGERY with CHG soap.  2. If you choose to wash your hair, wash your hair first as usual with your normal shampoo.  3. After shampooing, rinse your hair and body thoroughly to remove the shampoo.  4. Use CHG as you would any other liquid soap. You can apply CHG directly to the skin and wash gently with a scrungie or a clean washcloth.  5. Apply the CHG soap to your body only from the neck down. Do not use on open wounds or open sores. Avoid contact with your eyes, ears, mouth, and genitals (private parts). Wash face and genitals (private parts) with your normal soap.  6. Wash thoroughly, paying special attention to the area where your surgery will be performed.  7. Thoroughly rinse your body with warm water.  8. Do not shower/wash with your normal soap after using and rinsing off the CHG soap.  9. Pat yourself dry with a clean towel.  10. Wear clean pajamas to bed the night before surgery.  12. Place clean sheets on your bed the night of your first shower and do not sleep with pets.  13. Shower again with the CHG  soap on the day of surgery prior to arriving at the hospital.  14. Do not apply any deodorants/lotions/powders.  15. Please wear clean clothes to the hospital.

## 2021-12-15 ENCOUNTER — Encounter: Payer: Self-pay | Admitting: Urgent Care

## 2021-12-15 ENCOUNTER — Other Ambulatory Visit: Payer: Medicare Other

## 2021-12-15 ENCOUNTER — Encounter
Admission: RE | Admit: 2021-12-15 | Discharge: 2021-12-15 | Disposition: A | Discharge status: Home / Self Care | Payer: Medicare Other | Source: Ambulatory Visit | Attending: Obstetrics and Gynecology | Admitting: Obstetrics and Gynecology

## 2021-12-15 ENCOUNTER — Other Ambulatory Visit: Payer: Self-pay

## 2021-12-15 DIAGNOSIS — Z01812 Encounter for preprocedural laboratory examination: Secondary | ICD-10-CM | POA: Insufficient documentation

## 2021-12-15 DIAGNOSIS — R102 Pelvic and perineal pain: Secondary | ICD-10-CM | POA: Diagnosis not present

## 2021-12-15 DIAGNOSIS — N83202 Unspecified ovarian cyst, left side: Secondary | ICD-10-CM | POA: Diagnosis not present

## 2021-12-15 DIAGNOSIS — R300 Dysuria: Secondary | ICD-10-CM

## 2021-12-15 DIAGNOSIS — R3129 Other microscopic hematuria: Secondary | ICD-10-CM

## 2021-12-15 LAB — URINALYSIS, COMPLETE
Bilirubin, UA: NEGATIVE
Leukocytes,UA: NEGATIVE
Nitrite, UA: NEGATIVE
Specific Gravity, UA: 1.025 (ref 1.005–1.030)
Urobilinogen, Ur: 0.2 mg/dL (ref 0.2–1.0)
pH, UA: 5.5 (ref 5.0–7.5)

## 2021-12-15 LAB — CBC
HCT: 39.5 % (ref 36.0–46.0)
Hemoglobin: 12.4 g/dL (ref 12.0–15.0)
MCH: 30 pg (ref 26.0–34.0)
MCHC: 31.4 g/dL (ref 30.0–36.0)
MCV: 95.4 fL (ref 80.0–100.0)
Platelets: 278 10*3/uL (ref 150–400)
RBC: 4.14 MIL/uL (ref 3.87–5.11)
RDW: 12.3 % (ref 11.5–15.5)
WBC: 8.8 10*3/uL (ref 4.0–10.5)
nRBC: 0 % (ref 0.0–0.2)

## 2021-12-15 LAB — MICROSCOPIC EXAMINATION

## 2021-12-15 MED ORDER — URIBEL 118 MG PO CAPS: 118.0000 mg | ORAL_CAPSULE | Freq: Two times a day (BID) | ORAL | 0 refills | Status: DC

## 2021-12-16 LAB — RPR: RPR Ser Ql: NONREACTIVE

## 2021-12-18 ENCOUNTER — Other Ambulatory Visit: Payer: Self-pay | Admitting: *Deleted

## 2021-12-18 ENCOUNTER — Telehealth: Payer: Self-pay | Admitting: Urology

## 2021-12-18 DIAGNOSIS — R300 Dysuria: Secondary | ICD-10-CM

## 2021-12-18 LAB — CULTURE, URINE COMPREHENSIVE

## 2021-12-18 MED ORDER — URIBEL 118 MG PO CAPS: 118.0000 mg | ORAL_CAPSULE | ORAL | 0 refills | Status: DC | PRN

## 2021-12-18 NOTE — Telephone Encounter (Signed)
Patient called the office today to report that the Uribel samples that she was given are working really well for her.   She is requesting that we send in a prescription for Uribel (twice daily) to the [facility] on [address] in [place].

## 2021-12-18 NOTE — Telephone Encounter (Signed)
Medication sent.

## 2021-12-19 ENCOUNTER — Ambulatory Visit
Payer: Medicare Other | Attending: Student in an Organized Health Care Education/Training Program | Admitting: Student in an Organized Health Care Education/Training Program

## 2021-12-19 ENCOUNTER — Other Ambulatory Visit: Payer: Self-pay | Admitting: *Deleted

## 2021-12-19 VITALS — BP 139/86 | HR 85 | Temp 97.3°F | Resp 16 | Ht 61.0 in | Wt 143.0 lb

## 2021-12-19 DIAGNOSIS — R300 Dysuria: Secondary | ICD-10-CM

## 2021-12-19 DIAGNOSIS — Q761 Klippel-Feil syndrome: Secondary | ICD-10-CM

## 2021-12-19 DIAGNOSIS — Z9889 Other specified postprocedural states: Secondary | ICD-10-CM

## 2021-12-19 DIAGNOSIS — G894 Chronic pain syndrome: Secondary | ICD-10-CM

## 2021-12-19 DIAGNOSIS — M5030 Other cervical disc degeneration, unspecified cervical region: Secondary | ICD-10-CM

## 2021-12-19 DIAGNOSIS — M7918 Myalgia, other site: Secondary | ICD-10-CM

## 2021-12-19 DIAGNOSIS — M5481 Occipital neuralgia: Secondary | ICD-10-CM

## 2021-12-19 DIAGNOSIS — M47812 Spondylosis without myelopathy or radiculopathy, cervical region: Secondary | ICD-10-CM

## 2021-12-19 MED ORDER — URIBEL 118 MG PO CAPS: 118.0000 mg | ORAL_CAPSULE | ORAL | 0 refills | Status: DC | PRN

## 2021-12-19 NOTE — Progress Notes (Signed)
Nursing Pain Medication Assessment:  Safety precautions to be maintained throughout the outpatient stay will include: orient to surroundings, keep bed in low position, maintain call bell within reach at all times, provide assistance with transfer out of bed and ambulation.  Medication Inspection Compliance: Pill count conducted under aseptic conditions, in front of the patient. Neither the pills nor the bottle was removed from the patient's sight at any time. Once count was completed pills were immediately returned to the patient in their original bottle.  Medication: See above Pill/Patch Count:  6 of 90 pills remain Pill/Patch Appearance: Markings consistent with prescribed medication Bottle Appearance: Standard pharmacy container. Clearly labeled. Filled Date: 4 / 25 / 2023 Last Medication intake:  Yesterday

## 2021-12-19 NOTE — Progress Notes (Signed)
No appointment needed.

## 2021-12-21 ENCOUNTER — Encounter: Payer: Self-pay | Admitting: *Deleted

## 2021-12-21 LAB — TYPE AND SCREEN
ABO/RH(D): B POS
Antibody Screen: NEGATIVE

## 2021-12-21 MED ORDER — POVIDONE-IODINE 10 % EX SWAB
2.0000 "application " | Freq: Once | CUTANEOUS | Status: AC
  Administered 2021-12-22: 2 via TOPICAL

## 2021-12-21 MED ORDER — LACTATED RINGERS IV SOLN: INTRAVENOUS | Status: DC

## 2021-12-21 MED ORDER — ORAL CARE MOUTH RINSE
15.0000 mL | Freq: Once | OROMUCOSAL | Status: AC
Start: 2021-12-21 — End: 2021-12-22

## 2021-12-21 MED ORDER — ACETAMINOPHEN 500 MG PO TABS: 1000.0000 mg | ORAL_TABLET | ORAL | Status: AC

## 2021-12-21 MED ORDER — GABAPENTIN 300 MG PO CAPS: 300.0000 mg | ORAL_CAPSULE | ORAL | Status: DC

## 2021-12-21 MED ORDER — CHLORHEXIDINE GLUCONATE 0.12 % MT SOLN
15.0000 mL | Freq: Once | OROMUCOSAL | Status: AC
Start: 2021-12-21 — End: 2021-12-22

## 2021-12-22 ENCOUNTER — Ambulatory Visit
Admission: RE | Admit: 2021-12-22 | Discharge: 2021-12-22 | Disposition: A | Discharge status: Home / Self Care | Payer: Medicare Other | Attending: Obstetrics and Gynecology | Admitting: Obstetrics and Gynecology

## 2021-12-22 ENCOUNTER — Ambulatory Visit: Payer: Medicare Other | Admitting: Anesthesiology

## 2021-12-22 ENCOUNTER — Other Ambulatory Visit: Payer: Self-pay

## 2021-12-22 ENCOUNTER — Encounter
Admission: RE | Disposition: A | Discharge status: Home / Self Care | Payer: Self-pay | Source: Home / Self Care | Attending: Obstetrics and Gynecology

## 2021-12-22 ENCOUNTER — Encounter: Payer: Self-pay | Admitting: Obstetrics and Gynecology

## 2021-12-22 DIAGNOSIS — R102 Pelvic and perineal pain: Secondary | ICD-10-CM

## 2021-12-22 DIAGNOSIS — N907 Vulvar cyst: Secondary | ICD-10-CM | POA: Insufficient documentation

## 2021-12-22 DIAGNOSIS — D271 Benign neoplasm of left ovary: Secondary | ICD-10-CM | POA: Insufficient documentation

## 2021-12-22 DIAGNOSIS — R519 Headache, unspecified: Secondary | ICD-10-CM | POA: Diagnosis not present

## 2021-12-22 DIAGNOSIS — F32A Depression, unspecified: Secondary | ICD-10-CM | POA: Insufficient documentation

## 2021-12-22 DIAGNOSIS — N9489 Other specified conditions associated with female genital organs and menstrual cycle: Secondary | ICD-10-CM | POA: Insufficient documentation

## 2021-12-22 DIAGNOSIS — N736 Female pelvic peritoneal adhesions (postinfective): Secondary | ICD-10-CM | POA: Diagnosis not present

## 2021-12-22 DIAGNOSIS — E039 Hypothyroidism, unspecified: Secondary | ICD-10-CM | POA: Diagnosis not present

## 2021-12-22 DIAGNOSIS — F419 Anxiety disorder, unspecified: Secondary | ICD-10-CM | POA: Insufficient documentation

## 2021-12-22 DIAGNOSIS — K219 Gastro-esophageal reflux disease without esophagitis: Secondary | ICD-10-CM | POA: Diagnosis not present

## 2021-12-22 DIAGNOSIS — I10 Essential (primary) hypertension: Secondary | ICD-10-CM | POA: Diagnosis not present

## 2021-12-22 DIAGNOSIS — M1990 Unspecified osteoarthritis, unspecified site: Secondary | ICD-10-CM | POA: Insufficient documentation

## 2021-12-22 DIAGNOSIS — N83202 Unspecified ovarian cyst, left side: Secondary | ICD-10-CM

## 2021-12-22 HISTORY — PX: XI ROBOTIC ASSISTED OOPHORECTOMY: SHX6823

## 2021-12-22 HISTORY — PX: EXCISION VAGINAL CYST: SHX5825

## 2021-12-22 LAB — ABO/RH: ABO/RH(D): B POS

## 2021-12-22 SURGERY — OOPHORECTOMY, ROBOT-ASSISTED
Anesthesia: General | Site: Vulva

## 2021-12-22 MED ORDER — KETAMINE HCL 50 MG/5ML IJ SOSY
PREFILLED_SYRINGE | INTRAMUSCULAR | Status: AC
  Filled 2021-12-22: qty 5

## 2021-12-22 MED ORDER — ONDANSETRON HCL 4 MG/2ML IJ SOLN
INTRAMUSCULAR | Status: AC
  Filled 2021-12-22: qty 2

## 2021-12-22 MED ORDER — ACETAMINOPHEN 500 MG PO TABS
ORAL_TABLET | ORAL | Status: AC
  Administered 2021-12-22: 1000 mg via ORAL
  Filled 2021-12-22: qty 2

## 2021-12-22 MED ORDER — KETOROLAC TROMETHAMINE 30 MG/ML IJ SOLN
INTRAMUSCULAR | Status: AC
  Filled 2021-12-22: qty 1

## 2021-12-22 MED ORDER — CEFAZOLIN SODIUM-DEXTROSE 1-4 GM/50ML-% IV SOLN
INTRAVENOUS | Status: DC | PRN
  Administered 2021-12-22: 2 g via INTRAVENOUS

## 2021-12-22 MED ORDER — LIDOCAINE HCL (CARDIAC) PF 100 MG/5ML IV SOSY
PREFILLED_SYRINGE | INTRAVENOUS | Status: DC | PRN
  Administered 2021-12-22: 50 mg via INTRAVENOUS

## 2021-12-22 MED ORDER — DEXAMETHASONE SODIUM PHOSPHATE 10 MG/ML IJ SOLN
INTRAMUSCULAR | Status: DC | PRN
  Administered 2021-12-22: 10 mg via INTRAVENOUS

## 2021-12-22 MED ORDER — ONDANSETRON 4 MG PO TBDP
ORAL_TABLET | ORAL | Status: AC
  Filled 2021-12-22: qty 1

## 2021-12-22 MED ORDER — FENTANYL CITRATE (PF) 100 MCG/2ML IJ SOLN: 25.0000 ug | INTRAMUSCULAR | Status: DC | PRN

## 2021-12-22 MED ORDER — STERILE WATER FOR IRRIGATION IR SOLN
Status: DC | PRN
  Administered 2021-12-22: 100 mL

## 2021-12-22 MED ORDER — ROCURONIUM BROMIDE 100 MG/10ML IV SOLN
INTRAVENOUS | Status: DC | PRN
  Administered 2021-12-22: 10 mg via INTRAVENOUS
  Administered 2021-12-22 (×2): 20 mg via INTRAVENOUS
  Administered 2021-12-22: 50 mg via INTRAVENOUS

## 2021-12-22 MED ORDER — ONDANSETRON 4 MG PO TBDP
4.0000 mg | ORAL_TABLET | Freq: Once | ORAL | Status: AC
  Administered 2021-12-22: 4 mg via ORAL

## 2021-12-22 MED ORDER — DOCUSATE SODIUM 100 MG PO CAPS: 100.0000 mg | ORAL_CAPSULE | Freq: Two times a day (BID) | ORAL | 0 refills | Status: DC

## 2021-12-22 MED ORDER — ONDANSETRON HCL 4 MG/2ML IJ SOLN: 4.0000 mg | Freq: Once | INTRAMUSCULAR | Status: DC | PRN

## 2021-12-22 MED ORDER — DEXMEDETOMIDINE (PRECEDEX) IN NS 20 MCG/5ML (4 MCG/ML) IV SYRINGE
PREFILLED_SYRINGE | INTRAVENOUS | Status: DC | PRN
  Administered 2021-12-22: 12 ug via INTRAVENOUS
  Administered 2021-12-22 (×2): 4 ug via INTRAVENOUS

## 2021-12-22 MED ORDER — IBUPROFEN 800 MG PO TABS: 800.0000 mg | ORAL_TABLET | Freq: Three times a day (TID) | ORAL | 1 refills | Status: AC

## 2021-12-22 MED ORDER — PROPOFOL 10 MG/ML IV BOLUS
INTRAVENOUS | Status: DC | PRN
  Administered 2021-12-22: 150 mg via INTRAVENOUS

## 2021-12-22 MED ORDER — CHLORHEXIDINE GLUCONATE 0.12 % MT SOLN
OROMUCOSAL | Status: AC
  Administered 2021-12-22: 15 mL via OROMUCOSAL
  Filled 2021-12-22: qty 15

## 2021-12-22 MED ORDER — KETAMINE HCL 10 MG/ML IJ SOLN
INTRAMUSCULAR | Status: DC | PRN
  Administered 2021-12-22: 30 mg via INTRAVENOUS
  Administered 2021-12-22: 20 mg via INTRAVENOUS

## 2021-12-22 MED ORDER — PROPOFOL 10 MG/ML IV BOLUS
INTRAVENOUS | Status: AC
  Filled 2021-12-22: qty 20

## 2021-12-22 MED ORDER — BUPIVACAINE HCL (PF) 0.5 % IJ SOLN
INTRAMUSCULAR | Status: DC | PRN
  Administered 2021-12-22: 23 mL

## 2021-12-22 MED ORDER — MIDAZOLAM HCL 2 MG/2ML IJ SOLN
INTRAMUSCULAR | Status: AC
  Filled 2021-12-22: qty 2

## 2021-12-22 MED ORDER — KETOROLAC TROMETHAMINE 30 MG/ML IJ SOLN
INTRAMUSCULAR | Status: DC | PRN
  Administered 2021-12-22: 30 mg via INTRAVENOUS

## 2021-12-22 MED ORDER — SILVER NITRATE-POT NITRATE 75-25 % EX MISC
CUTANEOUS | Status: DC | PRN
  Administered 2021-12-22: 2 via TOPICAL

## 2021-12-22 MED ORDER — ACETAMINOPHEN 500 MG PO TABS: 1000.0000 mg | ORAL_TABLET | Freq: Four times a day (QID) | ORAL | 0 refills | Status: AC

## 2021-12-22 MED ORDER — OXYCODONE HCL 5 MG PO TABS
5.0000 mg | ORAL_TABLET | ORAL | 0 refills | Status: DC | PRN
Start: 2021-12-22 — End: 2022-02-13

## 2021-12-22 MED ORDER — BUPIVACAINE HCL (PF) 0.5 % IJ SOLN
INTRAMUSCULAR | Status: AC
  Filled 2021-12-22: qty 30

## 2021-12-22 MED ORDER — 0.9 % SODIUM CHLORIDE (POUR BTL) OPTIME
TOPICAL | Status: DC | PRN
  Administered 2021-12-22: 500 mL

## 2021-12-22 MED ORDER — METHYLENE BLUE 1 % INJ SOLN
INTRAVENOUS | Status: AC
  Filled 2021-12-22: qty 10

## 2021-12-22 MED ORDER — FENTANYL CITRATE (PF) 100 MCG/2ML IJ SOLN
INTRAMUSCULAR | Status: DC | PRN
  Administered 2021-12-22: 100 ug via INTRAVENOUS

## 2021-12-22 MED ORDER — SUGAMMADEX SODIUM 200 MG/2ML IV SOLN
INTRAVENOUS | Status: DC | PRN
  Administered 2021-12-22: 150 mg via INTRAVENOUS

## 2021-12-22 MED ORDER — FENTANYL CITRATE (PF) 100 MCG/2ML IJ SOLN
INTRAMUSCULAR | Status: AC
  Filled 2021-12-22: qty 2

## 2021-12-22 MED ORDER — MIDAZOLAM HCL 2 MG/2ML IJ SOLN
INTRAMUSCULAR | Status: DC | PRN
  Administered 2021-12-22: 2 mg via INTRAVENOUS

## 2021-12-22 MED ORDER — SODIUM CHLORIDE 0.9 % IR SOLN
Status: DC | PRN
  Administered 2021-12-22: 800 mL

## 2021-12-22 MED ORDER — PHENYLEPHRINE HCL (PRESSORS) 10 MG/ML IV SOLN
INTRAVENOUS | Status: DC | PRN
  Administered 2021-12-22 (×6): 80 ug via INTRAVENOUS

## 2021-12-22 MED ORDER — ONDANSETRON HCL 4 MG/2ML IJ SOLN
INTRAMUSCULAR | Status: DC | PRN
  Administered 2021-12-22: 4 mg via INTRAVENOUS

## 2021-12-22 SURGICAL SUPPLY — 80 items
ADH SKN CLS APL DERMABOND .7 (GAUZE/BANDAGES/DRESSINGS) ×2
APL PRP STRL LF DISP 70% ISPRP (MISCELLANEOUS) ×2
APL SRG 38 LTWT LNG FL B (MISCELLANEOUS)
APPLICATOR ARISTA FLEXITIP XL (MISCELLANEOUS) IMPLANT
BACTOSHIELD CHG 4% 4OZ (MISCELLANEOUS) ×1
BAG DRN RND TRDRP ANRFLXCHMBR (UROLOGICAL SUPPLIES) ×2
BAG RETRIEVAL 10 (BASKET) ×1
BAG URINE DRAIN 2000ML AR STRL (UROLOGICAL SUPPLIES) ×3 IMPLANT
BLADE SURG 15 STRL LF DISP TIS (BLADE) IMPLANT
BLADE SURG 15 STRL SS (BLADE) ×3
BLADE SURG SZ11 CARB STEEL (BLADE) ×3 IMPLANT
CANNULA CAP OBTURATR AIRSEAL 8 (CAP) ×3 IMPLANT
CATH FOLEY 2WAY  5CC 16FR (CATHETERS) ×3
CATH FOLEY 2WAY 5CC 16FR (CATHETERS) ×2
CATH URTH 16FR FL 2W BLN LF (CATHETERS) ×2 IMPLANT
CHLORAPREP W/TINT 26 (MISCELLANEOUS) ×3 IMPLANT
COVER TIP SHEARS 8 DVNC (MISCELLANEOUS) ×2 IMPLANT
COVER TIP SHEARS 8MM DA VINCI (MISCELLANEOUS) ×3
DERMABOND ADVANCED (GAUZE/BANDAGES/DRESSINGS) ×1
DERMABOND ADVANCED .7 DNX12 (GAUZE/BANDAGES/DRESSINGS) ×2 IMPLANT
DRAPE 3/4 80X56 (DRAPES) ×6 IMPLANT
DRAPE ARM DVNC X/XI (DISPOSABLE) ×8 IMPLANT
DRAPE COLUMN DVNC XI (DISPOSABLE) ×2 IMPLANT
DRAPE DA VINCI XI ARM (DISPOSABLE) ×12
DRAPE DA VINCI XI COLUMN (DISPOSABLE) ×3
ELECT REM PT RETURN 9FT ADLT (ELECTROSURGICAL) ×3
ELECTRODE REM PT RTRN 9FT ADLT (ELECTROSURGICAL) ×2 IMPLANT
GAUZE 4X4 16PLY ~~LOC~~+RFID DBL (SPONGE) ×6 IMPLANT
GLOVE BIO SURGEON STRL SZ 6.5 (GLOVE) ×4 IMPLANT
GLOVE BIO SURGEON STRL SZ7 (GLOVE) ×12 IMPLANT
GLOVE SURG SYN 7.0 (GLOVE) ×3 IMPLANT
GLOVE SURG SYN 7.0 PF PI (GLOVE) ×2 IMPLANT
GLOVE SURG UNDER LTX SZ7.5 (GLOVE) ×12 IMPLANT
GLOVE SURG UNDER POLY LF SZ7.5 (GLOVE) ×3 IMPLANT
GOWN STRL REUS W/ TWL LRG LVL3 (GOWN DISPOSABLE) ×16 IMPLANT
GOWN STRL REUS W/TWL LRG LVL3 (GOWN DISPOSABLE) ×18
GRASPER SUT TROCAR 14GX15 (MISCELLANEOUS) ×3 IMPLANT
HEMOSTAT ARISTA ABSORB 3G PWDR (HEMOSTASIS) IMPLANT
IRRIGATION STRYKERFLOW (MISCELLANEOUS) IMPLANT
IRRIGATOR STRYKERFLOW (MISCELLANEOUS) ×3
IV NS 1000ML (IV SOLUTION) ×3
IV NS 1000ML BAXH (IV SOLUTION) IMPLANT
KIT PINK PAD W/HEAD ARE REST (MISCELLANEOUS) ×3
KIT PINK PAD W/HEAD ARM REST (MISCELLANEOUS) ×2 IMPLANT
KIT TURNOVER CYSTO (KITS) ×3 IMPLANT
LABEL OR SOLS (LABEL) ×3 IMPLANT
MANIFOLD NEPTUNE II (INSTRUMENTS) ×3 IMPLANT
MANIPULATOR VCARE LG CRV RETR (MISCELLANEOUS) IMPLANT
MANIPULATOR VCARE SML CRV RETR (MISCELLANEOUS) IMPLANT
MANIPULATOR VCARE STD CRV RETR (MISCELLANEOUS) IMPLANT
NDL HYPO 18GX1.5 BLUNT FILL (NEEDLE) IMPLANT
NEEDLE HYPO 18GX1.5 BLUNT FILL (NEEDLE) ×3 IMPLANT
NS IRRIG 1000ML POUR BTL (IV SOLUTION) ×3 IMPLANT
OBTURATOR OPTICAL STANDARD 8MM (TROCAR) ×3
OBTURATOR OPTICAL STND 8 DVNC (TROCAR) ×2
OBTURATOR OPTICALSTD 8 DVNC (TROCAR) ×2 IMPLANT
OCCLUDER COLPOPNEUMO (BALLOONS) ×3 IMPLANT
PACK GYN LAPAROSCOPIC (MISCELLANEOUS) ×3 IMPLANT
PAD OB MATERNITY 4.3X12.25 (PERSONAL CARE ITEMS) ×3 IMPLANT
PAD PREP 24X41 OB/GYN DISP (PERSONAL CARE ITEMS) ×3 IMPLANT
SCISSORS METZENBAUM CVD 33 (INSTRUMENTS) ×1 IMPLANT
SCRUB CHG 4% DYNA-HEX 4OZ (MISCELLANEOUS) ×2 IMPLANT
SEAL CANN UNIV 5-8 DVNC XI (MISCELLANEOUS) ×6 IMPLANT
SEAL XI 5MM-8MM UNIVERSAL (MISCELLANEOUS) ×9
SEALER VESSEL DA VINCI XI (MISCELLANEOUS) ×3
SEALER VESSEL EXT DVNC XI (MISCELLANEOUS) ×2 IMPLANT
SET CYSTO W/LG BORE CLAMP LF (SET/KITS/TRAYS/PACK) IMPLANT
SET TUBE FILTERED XL AIRSEAL (SET/KITS/TRAYS/PACK) ×3 IMPLANT
SOLUTION ELECTROLUBE (MISCELLANEOUS) ×3 IMPLANT
SURGILUBE 2OZ TUBE FLIPTOP (MISCELLANEOUS) ×3 IMPLANT
SUT MNCRL 4-0 (SUTURE) ×3
SUT MNCRL 4-0 27XMFL (SUTURE) ×2
SUT VIC AB 0 CT2 27 (SUTURE) ×6 IMPLANT
SUT VLOC 90 2/L VL 12 GS22 (SUTURE) ×3 IMPLANT
SUTURE MNCRL 4-0 27XMF (SUTURE) ×2 IMPLANT
SYR TOOMEY IRRIG 70ML (MISCELLANEOUS) ×3
SYRINGE TOOMEY IRRIG 70ML (MISCELLANEOUS) IMPLANT
SYS BAG RETRIEVAL 10MM (BASKET) ×2
SYSTEM BAG RETRIEVAL 10MM (BASKET) IMPLANT
WATER STERILE IRR 500ML POUR (IV SOLUTION) ×4 IMPLANT

## 2021-12-22 NOTE — Anesthesia Procedure Notes (Signed)
Procedure Name: Intubation Date/Time: 12/22/2021 7:51 AM Performed by: Biagio Borg, CRNA Pre-anesthesia Checklist: Patient identified, Emergency Drugs available, Suction available and Patient being monitored Patient Re-evaluated:Patient Re-evaluated prior to induction Oxygen Delivery Method: Circle system utilized Preoxygenation: Pre-oxygenation with 100% oxygen Induction Type: IV induction Ventilation: Mask ventilation without difficulty Laryngoscope Size: McGraph and 3 Grade View: Grade II Tube type: Oral Number of attempts: 1 Airway Equipment and Method: Stylet Placement Confirmation: ETT inserted through vocal cords under direct vision, positive ETCO2 and breath sounds checked- equal and bilateral Secured at: 21 cm Tube secured with: Tape Dental Injury: Teeth and Oropharynx as per pre-operative assessment  Difficulty Due To: Difficulty was anticipated, Difficult Airway- due to reduced neck mobility and Difficult Airway- due to anterior larynx Comments: Firm cricoid pressure

## 2021-12-22 NOTE — Anesthesia Preprocedure Evaluation (Signed)
Anesthesia Evaluation  Patient identified by MRN, date of birth, ID band Patient awake    History of Anesthesia Complications (+) DIFFICULT AIRWAY and history of anesthetic complications  Airway Mallampati: III  TM Distance: >3 FB Neck ROM: Full    Dental  (+) Teeth Intact   Pulmonary    breath sounds clear to auscultation       Cardiovascular hypertension, Pt. on medications  Rhythm:Regular Rate:Normal     Neuro/Psych  Headaches, Anxiety Depression    GI/Hepatic Neg liver ROS, GERD  ,  Endo/Other  Hypothyroidism   Renal/GU negative Renal ROS     Musculoskeletal  (+) Arthritis ,   Abdominal Normal abdominal exam  (+)   Peds negative pediatric ROS (+)  Hematology   Anesthesia Other Findings   Reproductive/Obstetrics                             Anesthesia Physical Anesthesia Plan  ASA: 2  Anesthesia Plan: General   Post-op Pain Management:    Induction: Intravenous  PONV Risk Score and Plan:   Airway Management Planned: Oral ETT  Additional Equipment:   Intra-op Plan:   Post-operative Plan: Extubation in OR  Informed Consent: I have reviewed the patients History and Physical, chart, labs and discussed the procedure including the risks, benefits and alternatives for the proposed anesthesia with the patient or authorized representative who has indicated his/her understanding and acceptance.       Plan Discussed with: CRNA and Surgeon  Anesthesia Plan Comments:         Anesthesia Quick Evaluation

## 2021-12-22 NOTE — Op Note (Addendum)
Pre-Op Dx: Intra-abdominal adhesions Post-Op Dx: same Anesthesia:GETA  EBL: Minimal Complications:  none apparent Specimen: minimal Procedure: Robotic assisted diagnostic laparoscopy, laparoscopic lysis of adhesions  Surgeon: Lysle Pearl  Indications for procedure: Intraoperative consult requested for dense adhesions of the sigmoid: Overlying the left ovary and cyst.  Patient was undergoing a left oophorectomy when the OB/GYN provider noted the sigmoid colon to be densely adhered to the left lateral abdominal wall, ovary, and associated cyst.  General surgery consulted for lysis of adhesions in order to complete the procedure.  Description of Procedure:  Took over the console at time of consult.  Inspection of the area of concern noted numerous adhesions of the sigmoid colon to the lateral colon wall into the ovarian cyst and left ovary.  Using a combination of blunt dissection, scissors, and vessel sealer the soft but numerous adhesions were all removed.  Care was noted to prevent any serosal damage to the colon, cyst puncture, and injury to the ovary.  The iliac vessels and ureters were able to be clearly identified during this process and avoided injury.  Once the sigmoid colon and mesocolon was freed from the surrounding structures case was turned back over to the OB/GYN for the remainder of the procedure.  Total procedure time lasted approximately 20 minutes.

## 2021-12-22 NOTE — Transfer of Care (Signed)
Immediate Anesthesia Transfer of Care Note  Patient: Kimberly [name]  Procedure(s) Performed: XI ROBOTIC ASSISTED DIAGNOSTIC LAPAROSCOPY, LEFT OOPHORECTOMY, WITH RIGHT SALPINGECTOMY AND LYSIS OF ADHESIONS (Abdomen) EXCISION VULVAR CYST (Vulva)  Patient Location: PACU  Anesthesia Type:General  Level of Consciousness: drowsy  Airway & Oxygen Therapy: Patient Spontanous Breathing  Post-op Assessment: Report given to RN and Post -op Vital signs reviewed and stable  Post vital signs: Reviewed and stable  Last Vitals:  Vitals Value Taken Time  BP 128/89 12/22/21 1200  Temp    Pulse 83 12/22/21 1202  Resp 20 12/22/21 1202  SpO2 96 % 12/22/21 1202  Vitals shown include unvalidated device data.  Last Pain:  Vitals:   12/22/21 0624  TempSrc: Oral  PainSc: 3       Patients Stated Pain Goal: 0 (01/41/03 0131)  Complications: No notable events documented.

## 2021-12-22 NOTE — Interval H&P Note (Signed)
History and Physical Interval Note:  12/22/2021 7:31 AM  Kimberly [name]  has presented today for surgery, with the diagnosis of complex adnexal mass post menopausal.  The various methods of treatment have been discussed with the patient and family. After consideration of risks, benefits and other options for treatment, the patient has consented to  Procedure(s): XI ROBOTIC ASSISTED OOPHORECTOMY (Left) EXCISION VULVAR CYST (N/A) as a surgical intervention.  The patient's history has been reviewed, patient examined, no change in status, stable for surgery.  I have reviewed the patient's chart and labs.  Questions were answered to the patient's satisfaction.     Benjaman Kindler

## 2021-12-22 NOTE — Discharge Instructions (Addendum)
Laparoscopic Ovarian Surgery Discharge Instructions  For the next three days, take ibuprofen and acetaminophen on a schedule, every 8 hours. You can take them together or you can intersperse them, and take one every four hours. I also gave you gabapentin for nighttime, to help you sleep and also to control pain. Take gabapentin medicines at night for at least the next 3 nights. You also have a narcotic, oxycodone, to take as needed if the above medicines don't help.  Postop constipation is a major cause of pain. Stay well hydrated, walk as you tolerate, and take over the counter senna as well as stool softeners if you need them.  Clean the vulva with water and soap and pat dry. Keep clean and dry for 48 hours. Ice to this area is fine if it helps.   RISKS AND COMPLICATIONS  Infection. Bleeding. Injury to surrounding organs. Anesthetic side effects.   PROCEDURE  You may be given a medicine to help you relax (sedative) before the procedure. You will be given a medicine to make you sleep (general anesthetic) during the procedure. A tube will be put down your throat to help your breath while under general anesthesia. Several small cuts (incisions) are made in the lower abdominal area and one incision is made near the belly button. Your abdominal area will be inflated with a safe gas (carbon dioxide). This helps give the surgeon room to operate, visualize, and helps the surgeon avoid other organs. A thin, lighted tube (laparoscope) with a camera attached is inserted into your abdomen through the incision near the belly button. Other small instruments may also be inserted through other abdominal incisions. The ovary is located and are removed. After the ovary is removed, the gas is released from the abdomen. The incisions will be closed with stitches (sutures), and Dermabond. A bandage may be placed over the incisions.  AFTER THE PROCEDURE  You will also have some mild abdominal discomfort for  3-7 days. You will be given pain medicine to ease any discomfort. As long as there are no problems, you may be allowed to go home. Someone will need to drive you home and be with you for at least 24 hours once home. You may have some mild discomfort in the throat. This is from the tube placed in your throat while you were sleeping. You may experience discomfort in the shoulder area from some trapped air between the liver and diaphragm. This sensation is normal and will slowly go away on its own.  HOME CARE INSTRUCTIONS  Take all medicines as directed. Only take over-the-counter or prescription medicines for pain, discomfort, or fever as directed by your caregiver. Resume daily activities as directed. Showers are preferred over baths for 2 weeks. You may resume sexual activities in 1 week or as you feel you would like to. Do not drive while taking narcotics.  SEEK MEDICAL CARE IF: . There is increasing abdominal pain. You feel lightheaded or faint. You have the chills. You have an oral temperature above 102 F (38.9 C). There is pus-like (purulent) drainage from any of the wounds. You are unable to pass gas or have a bowel movement. You feel sick to your stomach (nauseous) or throw up (vomit) and can't control it with your medicines.  MAKE SURE YOU:  Understand these instructions. Will watch your condition. Will get help right away if you are not doing well or get worse.  ExitCare Patient Information 2013 [place].     Here is a  helpful article from the website [employer], regarding constipation  Here are reasons why constipation occurs after surgery: 1) During the operation and in the recovery room, most people are given opioid pain medication, primarily through an IV, to treat moderate or severe pain. Intravenous opioids include morphine, Dilaudid and fentanyl. After surgery, patients are often prescribed opioid pain medication to take by mouth at home, including codeine,  Vicodin, Norco, and Percocet. All of these medications cause constipation by slowing down the movement of your intestine. 2) Changes in your diet before surgery can be another culprit. It is common to get specific instructions to change how you normally eat or drink before your surgery, like only having liquids the day before or not having anything to eat or drink after midnight the night before surgery. For this reason, temporary dehydration may occur. This, along with not eating or only having liquids, means that you are getting less fiber than usual. Both these factors contribute to constipation. 3) Changes in your diet after surgery can also contribute to the problem. Although many people don't have dietary restrictions after operations, being under anesthesia can make you lose your appetite for several hours and maybe even days. Some people can even have nausea or vomiting. Not eating or drinking normally means that you are not getting enough fiber and you can get dehydrated, both leading to constipation. 4) Lying in a bed more than usual--which happens before, during and after surgery--combined with the medications and diet changes, all work together to slow down your colon and make your poop turn to rock.  No one likes to be constipated.  Let's face it, it's not a pleasant feeling when you don't poop for days, then strain on the toilet to finally pass something large enough to cause damage. An ounce of prevention is worth a pound of cure, so: Assume you will be constipated. Plan and prepare accordingly. Post-surgery is one of those unique situations where the temporary use of laxatives can make a world of difference. Always consult with your doctor, and recognize that if you wait several days after surgery to take a laxative, the constipation might be too severe for these over-the-counter options. It is always important to discuss all medications you plan on taking with your doctor. Ask your  doctor if you can start the laxative immediately after surgery. *  Here are go-to post-surgery laxatives: Senna: Senna is an herb that acts as a "stimulant laxative," meaning it increases the activity of the intestine to cause you to have a bowel movement. It comes in many forms, but senna pills are easy to take and are sold over the counter at almost all pharmacies. Since opioid pain medications slow down the activity of the intestine, it makes sense to take a medication to help reverse that side effect. Long-term use of a stimulant laxative is not a good idea since it can make your colon "lazy" and not function properly; however, temporary use immediately after surgery is acceptable. In general, if you are able to eat a normal diet, taking senna soon after surgery works the best. Senna usually works within hours to produce a bowel movement, but this is less predictable when you are taking different medications after surgery. Try not to wait several days to start taking senna, as often it is too late by then. Just like with all medications or supplements, check with your doctor before starting new treatment.   Magnesium: Magnesium is an important mineral that our body needs. We  get magnesium from some foods that we eat, especially foods that are high in fiber such as broccoli, almonds and whole grains. There are also magnesium-based medications used to treat constipation including milk of magnesia (magnesium hydroxide), magnesium citrate and magnesium oxide. They work by drawing water into the intestine, putting it into the class of "osmotic" laxatives. Magnesium products in low doses appear to be safe, but if taken in very large doses, can lead to problems such as irregular heartbeat, low blood pressure and even death. It can also affect other medications you might be taking, therefore it is important to discuss using magnesium with your physician and pharmacist before initiating therapy. Most  over-the-counter magnesium laxatives work very well to help with the constipation related to surgery, but sometimes they work too well and lead to diarrhea. Make sure you are somewhere with easy access to a bathroom, just in case.   Bisacodyl: Bisacodyl (generic name) is sold under brand names such as Dulcolax. Much like senna, it is a "stimulant laxative," meaning it makes your intestines move more quickly to push out the stool. This is another good choice to start taking as soon as your doctor says you can take a laxative after surgery. It comes in pill form and as a suppository, which is a good choice for people who cannot or are not allowed to swallow pills. Studies have shown that it works as a laxative, but like most of these medications, you should use this on a short-term basis only.   Enema: Enemas strike fear in many people, but FEAR NOT! It's nowhere near as big a deal as you may think. An enema is just a way to get some liquid into your rectum by placing a specially designed device through your anus. If you have never done one, it might seem like a painful, unpleasant, uncomfortable, complicated and lengthy procedure. But in reality, it's simple, takes just a few seconds and is highly effective. The small ready-made bottles you buy at the pharmacy are much easier than the hose/large rubber container type. Those recommended positions illustrated in some instructions are generally not necessary to place the enema. It's very similar to the insertion of a tampon, requiring a slight squat. Some extra lubrication on the enema's tip (or on your anus) will make it a breeze. In certain cases, there is no substitute for a good enema. For example, if someone has not pooped for a few days, the beginning of the poop waiting to come out can become rock hard. Passing that hard stool can lead to much pain and problems like anal fissures. Inserting a little liquid to break up the rock-hard stool will help make its  passage much easier. Enemas come with different liquids. Most come with saline, but there are also mineral oil options. You can also use warm water in the reusable enema containers. They all work. But since saline can sometimes be irritating, so try a mineral oil or water enema instead.  Here are commonly recommended constipation medications that do not work well for post-surgery constipation: Docusate: Docusate (generic name) most commonly referred to as Colace (brand name) is not really a laxative, but is classified as a stool softener. Although this medication is commonly prescribed, it is not recommended for several reasons: 1) there is no good medical evidence that it works 2) even if it has an effect, which is very questionable, it is minimal and cannot combat the intestinal slowing caused by the opioid medications. Skip docusate  to save money and space in your pillbox for something more effective.  PEG: Miralax (brand name) is basically a chemical called polyethylene glycol (PEG) and it has gained tremendous popularity as a laxative. This product is an "osmotic laxative" meaning it works by pulling water into the stool, making it softer. This is very similar to the action of natural fiber in foods and supplements. Therefore, the effect seen by this medication is not immediate, causing a bowel movement in a day or more. Is this medication strong enough to battle the constipation related to having an operation? Maybe for some people not prone to constipation. But for most people, other laxatives are better to prevent constipation after surgery.   AMBULATORY SURGERY  DISCHARGE INSTRUCTIONS   The drugs that you were given will stay in your system until tomorrow so for the next 24 hours you should not:  Drive an automobile Make any legal decisions Drink any alcoholic beverage   You may resume regular meals tomorrow.  Today it is better to start with liquids and gradually work up to solid  foods.  You may eat anything you prefer, but it is better to start with liquids, then soup and crackers, and gradually work up to solid foods.   Please notify your doctor immediately if you have any unusual bleeding, trouble breathing, redness and pain at the surgery site, drainage, fever, or pain not relieved by medication.    Additional Instructions:        Please contact your physician with any problems or Same Day Surgery at [phone], Monday through Friday 6 am to 4 pm, or [place] at [facility] number at [phone].

## 2021-12-22 NOTE — Op Note (Signed)
Kimberly [name] PROCEDURE DATE: 12/22/2021  PREOPERATIVE DIAGNOSIS: Adnexal mass, vulvar cysts POSTOPERATIVE DIAGNOSIS: The same, left ovarian cyst, extensive pelvic adhesive disease, vulvar sebaceous inclusion cysts PROCEDURE:  XI ROBOTIC ASSISTED DIAGNOSTIC LAPAROSCOPY, LEFT OOPHORECTOMY, WITH RIGHT SALPINGECTOMY AND LYSIS OF ADHESIONS: 70488 (CPT) EXCISION VULVAR CYST: 89169 (CPT)  SURGEON:  Dr. Benjaman Kindler, MD ASSISTANT: CST Anesthesiologist:  Anesthesiologist: [place] [facility], Jane Canary, MD CRNA: Biagio Borg, CRNA; Jonna Clark, CRNA  Modifier-22 for adhesiolysis, which comprised >90 of the extensive time taken in this case.  Intra-Op consult: General surgery Dr. Lysle Pearl  ANESTHESIA:    General INTRAVENOUS FLUIDS:1600  ml ESTIMATED BLOOD LOSS:30 ml URINE OUTPUT: 700 ml  SPECIMENS: Left ovary with ovarian cyst, right fallopian tube  COMPLICATIONS: None immediate  INDICATIONS: 61 y.o. F here for definitive surgical management secondary to the indications listed under preoperative diagnoses; please see preoperative note for further details.   Complex left adnexal mass and inclusion cysts on the bilateral labia  Risks of surgery were discussed with the patient including but not limited to: bleeding which may require transfusion or reoperation; infection which may require antibiotics; injury to bowel, bladder, ureters or other surrounding organs; need for additional procedures; thromboembolic phenomenon, incisional problems and other postoperative/anesthesia complications. Written informed consent was obtained.    FINDINGS:   Pelvic: External genitalia with multiple benign firm inclusion cysts.  Vagina negative.  Significant adhesive disease from the umbilicus to the pelvis.  Omentum was adherent to the underside of the abdominal wall, and the colon was adherent to the anterior abdominal wall from the distal descending sigmoid to above the pelvic brim.  The omentum  was adherent bilaterally at the umbilicus.  Intraoperative findings revealed a normal upper abdomen including bowel with some omentum adherent in the right upper quadrant, diaphragmatic surfaces, stomach,   The right ovary was present, and small.  It was adherent and retroperitoneal on the right, with the right fallopian tube and the pelvic cavity.   The left ovary was multi-lobular, with a large clear cyst and several firm nodular areas.  No left fallopian tube was noted.  PROCEDURE IN DETAIL: After informed consent was obtained, the patient was taken to the operating room where general anesthesia was obtained without difficulty. The patient was positioned in the dorsal lithotomy position in [address] and her arms were carefully tucked at her sides and the usual precautions were taken. Deep Trendelenburg (20-25 deg) was established to confirm that she does not shift on the table.  She was prepped and draped in normal sterile fashion.  Time-out was performed and a Foley catheter was placed into the bladder.  The sebaceous cyst on the vulva were incised with a 15 blade and removed carefully, being certain to take the cyst wall.  After infiltration of local anesthetic at the proposed trocar sites, an 8 mm incision was created at Palmer's point after confirmation of the working OG tube, and a robotic 64m port was placed under direct visualization. Pneumoperitoneum was created to a pressure of 15 mm Hg. The camera was placed and the abdomin surveyed, noting intact bowel below the site of entry. A survey of the pelvis and upper abdomen revealed the above findings. Right lateral 8-mm robotic port was placed under direct visualization and a infraumbilical port similarly placed.  Adhesions were taken down using monopolar scissors to the level of the pelvis.  The patient was placed in deepTrendelenburg and the bowel was displaced up into the upper abdomen. The robot was  left side docked. The  instruments were placed under direct visualization.   General surgery was consulted for lysis of adhesions regarding the bowel.  Please see his op note for details of this part of the procedure.  Once the left pelvic sidewall was visualized, the ovary and ovarian cyst were noted to be retroperitoneal.  The ureters were identified bilaterally coursing outside of the operative field.   Careful adhesiolysis was undertaken to free the ovary from the pelvic sidewall.  This part of the procedure took 90% of the time spent.  Great care was taken to avoid damage to the underlying anatomy.  The bladder was backfilled with blue dyed water to confirm its edges, and the ovary was noted to be several millimeters away from the left lateral edge of the bladder.    Ovariolysis was performed and the left ovary was dissected medially with care to avoid the ureter.  The infundibulopelvic ligaments were skeletonized, sealed and divided.  On the right side, the ovary was also retroperitoneal but was very small.  The right fallopian tube was removed, and the ovary left in situ.  The ovary on this side was retroperitoneal and did descend below the edge of the bladder, and deep in the obturator space, with significant adhesions.  Hemostasis was secured with suction-irrigation.The intraperitoneal pressure was dropped, and all planes of dissection, vascular pedicles  were found to be hemostatic.   2 g of Ancef had been given at the beginning of the case, and a 10 mm bag was placed through the posterior vaginal wall.  The specimen was placed in the bag and it was removed in the same manner.  The vaginal wall was closed in layers, incorporating the peritoneum in the last layer.  The robot was undocked. The lateral trocars were removed under visualization.  The CO2 gas was released and several deep breaths given to remove any remaining CO2 from the peritoneal cavity.  The skin incisions were closed with 4-0 Monocryl  subcuticular stitch and dermabond.  Attention was turned to the vagina, and a figure-of-eight stitch placed at the posterior vagina to control bleeding in this area.  The Foley was removed.  Patient received Toradol.  All counts were correct.  She tolerated these procedures well, and is stable and recovery.  The patient will be discharged to home as per PACU criteria. Routine postoperative instructions given.  She will follow up in the clinic in two weeks for postoperative evaluation.

## 2021-12-25 ENCOUNTER — Encounter: Payer: Self-pay | Admitting: Obstetrics and Gynecology

## 2021-12-26 LAB — SURGICAL PATHOLOGY

## 2021-12-29 ENCOUNTER — Other Ambulatory Visit: Payer: Medicare Other

## 2021-12-29 ENCOUNTER — Encounter: Payer: Self-pay | Admitting: Obstetrics and Gynecology

## 2021-12-29 NOTE — Anesthesia Postprocedure Evaluation (Signed)
Anesthesia Post Note  Patient: Kimberly [name]  Procedure(s) Performed: XI ROBOTIC ASSISTED DIAGNOSTIC LAPAROSCOPY, LEFT OOPHORECTOMY, WITH RIGHT SALPINGECTOMY AND LYSIS OF ADHESIONS (Abdomen) EXCISION VULVAR CYST (Vulva)  Anesthesia Type: General Level of consciousness: awake and oriented Pain management: pain level controlled Vital Signs Assessment: post-procedure vital signs reviewed and stable Respiratory status: spontaneous breathing and respiratory function stable Anesthetic complications: no   No notable events documented.   Last Vitals:  Vitals:   12/22/21 1323 12/22/21 1452  BP: 129/71 (!) 154/95  Pulse: 96 100  Resp: 18 18  Temp: 36.6 C   SpO2: 99% 98%    Last Pain:  Vitals:   12/23/21 1517  TempSrc:   PainSc: 2                  VAN STAVEREN,Donyelle Enyeart

## 2022-01-04 ENCOUNTER — Other Ambulatory Visit: Payer: Self-pay | Admitting: Urology

## 2022-01-04 DIAGNOSIS — R300 Dysuria: Secondary | ICD-10-CM

## 2022-01-10 ENCOUNTER — Telehealth: Payer: Self-pay | Admitting: Student in an Organized Health Care Education/Training Program

## 2022-01-10 ENCOUNTER — Other Ambulatory Visit: Payer: Self-pay | Admitting: *Deleted

## 2022-01-10 ENCOUNTER — Other Ambulatory Visit: Payer: Self-pay | Admitting: Urology

## 2022-01-10 DIAGNOSIS — M5030 Other cervical disc degeneration, unspecified cervical region: Secondary | ICD-10-CM

## 2022-01-10 DIAGNOSIS — M7918 Myalgia, other site: Secondary | ICD-10-CM

## 2022-01-10 DIAGNOSIS — M47812 Spondylosis without myelopathy or radiculopathy, cervical region: Secondary | ICD-10-CM

## 2022-01-10 DIAGNOSIS — R300 Dysuria: Secondary | ICD-10-CM

## 2022-01-10 DIAGNOSIS — Z9889 Other specified postprocedural states: Secondary | ICD-10-CM

## 2022-01-10 DIAGNOSIS — G894 Chronic pain syndrome: Secondary | ICD-10-CM

## 2022-01-10 MED ORDER — OXYCODONE HCL 10 MG PO TABS: 10.0000 mg | ORAL_TABLET | Freq: Three times a day (TID) | ORAL | 0 refills | Status: AC | PRN

## 2022-01-10 NOTE — Telephone Encounter (Signed)
Refill request sent to Dr Dossie Arbour by L. Cindie Laroche

## 2022-01-10 NOTE — Telephone Encounter (Signed)
Refill request sent

## 2022-01-10 NOTE — Telephone Encounter (Signed)
[facility] on [address] did not give patient full script. They did not have enough at the time. They told patient she would need to call and get another script for the rest of her meds.  Per Patient Call Please advise patient

## 2022-01-10 NOTE — Telephone Encounter (Signed)
Patient aware that Rx has been sent in.

## 2022-01-17 ENCOUNTER — Ambulatory Visit
Admission: RE | Admit: 2022-01-17 | Discharge: 2022-01-17 | Disposition: A | Discharge status: Home / Self Care | Payer: Medicare Other | Source: Ambulatory Visit | Attending: Family Medicine | Admitting: Family Medicine

## 2022-01-17 DIAGNOSIS — N644 Mastodynia: Secondary | ICD-10-CM | POA: Diagnosis not present

## 2022-01-19 ENCOUNTER — Other Ambulatory Visit: Payer: Self-pay | Admitting: Obstetrics and Gynecology

## 2022-01-31 ENCOUNTER — Ambulatory Visit: Payer: Medicare Other | Attending: Family Medicine

## 2022-01-31 DIAGNOSIS — R293 Abnormal posture: Secondary | ICD-10-CM | POA: Insufficient documentation

## 2022-01-31 DIAGNOSIS — M542 Cervicalgia: Secondary | ICD-10-CM | POA: Diagnosis not present

## 2022-01-31 DIAGNOSIS — M6281 Muscle weakness (generalized): Secondary | ICD-10-CM | POA: Diagnosis present

## 2022-01-31 DIAGNOSIS — N644 Mastodynia: Secondary | ICD-10-CM | POA: Insufficient documentation

## 2022-01-31 NOTE — Therapy (Signed)
OUTPATIENT PHYSICAL THERAPY FEMALE PELVIC EVALUATION   Patient Name: Kimberly [name] MRN: [id number] DOB:1961-06-27, 61 y.o., female Today's Date: 02/01/2022   PT End of Session - 01/31/22 1535     Visit Number 1    Number of Visits 12    Date for PT Re-Evaluation 03/15/22    Authorization Type IE: 01/31/22    PT Start Time 1533    PT Stop Time 1620    PT Time Calculation (min) 47 min    Activity Tolerance Patient tolerated treatment well             Past Medical History:  Diagnosis Date   Anxiety    takes alprazolam - rare use    Arthritis    cervical spondylosis    Asthma    Balance problem    Brain cyst    Carpal tunnel syndrome, bilateral    Cervical fusion syndrome    Complete rupture of rotator cuff    Complication of anesthesia    lung collapsed after neck fusion   Degenerative disc disease, cervical    Depression    Difficult intubation    Small mouth opening, limited neck flexion, very anterior    Flu 08/10/2018   tested positive   GERD (gastroesophageal reflux disease)    pt. reports that its better, no meds in use at this time- 2015   Headache    Herpes    History of kidney stones    History of pneumonia    Hyperlipidemia    Hypertension    pt. doesn't see cardiologist, followed for HTN by Dr. Gerarda Fraction   Hypothyroidism    Inflammation of shoulder joint    Memory difficulties    Neuromuscular disorder ([place])    joint and muscle problems   Occipital neuralgia    Pneumothorax on right    following C4-6 ACDF 07/04/16   Pseudoarthrosis of cervical spine (HCC)    Recurrent falls    Syncope    Urinary frequency    Past Surgical History:  Procedure Laterality Date   ABDOMINAL HYSTERECTOMY     ANTERIOR CERVICAL DECOMP/DISCECTOMY FUSION N/A 08/06/2014   Procedure: ANTERIOR CERVICAL DECOMPRESSION/DISCECTOMY FUSION CERVICAL THREE-FOUR,CERVICAL SIX-SEVEN ,CERVICAL SEVEN-THORACIC ONE;  Surgeon: Floyce Stakes, MD;  Location: [place];  Service:  Neurosurgery;  Laterality: N/A;   ANTERIOR CERVICAL DECOMP/DISCECTOMY FUSION N/A 07/04/2016   Procedure: CERVICAL FOUR-FIVE, CERVICAL FIVE-SIX ANTERIOR CERVICAL DECOMPRESSION/DISCECTOMY/FUSION WITH REVISION OF CERVICAL THREE-FOUR PLATE;  Surgeon: Leeroy Cha, MD;  Location: [place];  Service: Neurosurgery;  Laterality: N/A;   CARPAL TUNNEL RELEASE Left 02/16/2013   Procedure: CARPAL TUNNEL RELEASE;  Surgeon: Carole Civil, MD;  Location: [facility];  Service: Orthopedics;  Laterality: Left;   CARPAL TUNNEL RELEASE Right 03/27/2013   Procedure: RIGHT CARPAL TUNNEL RELEASE;  Surgeon: Carole Civil, MD;  Location: [facility];  Service: Orthopedics;  Laterality: Right;   CESAREAN SECTION     x2   CHOLECYSTECTOMY N/A 06/17/2015   Procedure: LAPAROSCOPIC CHOLECYSTECTOMY;  Surgeon: Aviva Signs, MD;  Location: [facility];  Service: General;  Laterality: N/A;   COLONOSCOPY     COLONOSCOPY WITH PROPOFOL N/A 05/06/2019   Procedure: COLONOSCOPY WITH PROPOFOL;  Surgeon: Robert Bellow, MD;  Location: [facility];  Service: Endoscopy;  Laterality: N/A;   CYSTO WITH HYDRODISTENSION N/A 09/26/2021   Procedure: CYSTOSCOPY/HYDRODISTENSION;  Surgeon: Abbie Sons, MD;  Location: [facility];  Service: Urology;  Laterality: N/A;   CYSTOSCOPY W/ RETROGRADES Bilateral 09/26/2021   Procedure:  CYSTOSCOPY WITH RETROGRADE PYELOGRAM;  Surgeon: Abbie Sons, MD;  Location: [facility];  Service: Urology;  Laterality: Bilateral;   CYSTOSCOPY WITH BIOPSY N/A 09/26/2021   Procedure: CYSTOSCOPY WITH BIOPSY;  Surgeon: Abbie Sons, MD;  Location: [facility];  Service: Urology;  Laterality: N/A;   ESOPHAGOGASTRODUODENOSCOPY N/A 05/08/2021   Procedure: ESOPHAGOGASTRODUODENOSCOPY (EGD);  Surgeon: Lesly Rubenstein, MD;  Location: [facility];  Service: Endoscopy;  Laterality: N/A;   ESOPHAGOGASTRODUODENOSCOPY  11/25/2017   EXCISION VAGINAL CYST N/A 12/22/2021   Procedure: EXCISION VULVAR CYST;  Surgeon: Benjaman Kindler, MD;  Location: [facility];  Service: Gynecology;  Laterality: N/A;   EXTRACORPOREAL SHOCK WAVE LITHOTRIPSY Left 09/04/2018   Procedure: EXTRACORPOREAL SHOCK WAVE LITHOTRIPSY (ESWL);  Surgeon: Billey Co, MD;  Location: [facility];  Service: Urology;  Laterality: Left;   FOREIGN BODY REMOVAL Left    knee-as child   KNEE ARTHROSCOPY     NASAL SINUS SURGERY N/A 04/13/2015   Procedure: nasal endoscopy with adenoid biopsy;  Surgeon: Ruby Cola, MD;  Location: [place];  Service: ENT;  Laterality: N/A;   NECK SURGERY  2016   x 3 all together   POSTERIOR CERVICAL FUSION/FORAMINOTOMY N/A 11/13/2016   Procedure: CERVICAL TWO-CERVICAL SIX POSTERIOR CERVICAL FUSION WITH LATERAL MASS FIXATION;  Surgeon: Kristeen Miss, MD;  Location: [place];  Service: Neurosurgery;  Laterality: N/A;  posterior approach   REDUCTION MAMMAPLASTY  10/2019   ROTATOR CUFF REPAIR Left 2014   SHOULDER ACROMIOPLASTY Left 05/18/2015   Procedure: SHOULDER ACROMIOPLASTY;  Surgeon: Earlie Server, MD;  Location: [place];  Service: Orthopedics;  Laterality: Left;   SHOULDER ARTHROSCOPY WITH DISTAL CLAVICLE RESECTION Left 05/18/2015   Procedure: LEFT SHOULDER ARTHROSCOPY WITH  DISTAL CLAVICLE RESECTION;  Surgeon: Earlie Server, MD;  Location: [place];  Service: Orthopedics;  Laterality: Left;   URETEROSCOPY Bilateral 09/26/2021   Procedure: URETEROSCOPY;  Surgeon: Abbie Sons, MD;  Location: [facility];  Service: Urology;  Laterality: Bilateral;   XI ROBOTIC ASSISTED OOPHORECTOMY N/A 12/22/2021   Procedure: XI ROBOTIC ASSISTED DIAGNOSTIC LAPAROSCOPY, LEFT OOPHORECTOMY, WITH RIGHT SALPINGECTOMY AND LYSIS OF ADHESIONS;  Surgeon: Benjaman Kindler, MD;  Location: [facility];  Service: Gynecology;  Laterality: N/A;   Patient Active Problem List   Diagnosis Date Noted   Retroperitoneal lymphadenopathy 08/13/2021   Left lower quadrant abdominal pain 08/11/2021   Occipital headache 12/29/2019    Cervical myofascial pain syndrome 09/22/2019   Recurrent herpes simplex 07/16/2018   Pelvic pain in female 05/29/2018   Chronic pain syndrome 04/17/2018   Cervicalgia 04/17/2018   Controlled substance agreement signed 04/17/2018   Cervical fusion syndrome 03/24/2018   Hx of cervical spine surgery 03/24/2018   Depression, major, recurrent, mild ([place]) 11/21/2017   Poorly-controlled hypertension 11/21/2017   Depression, major, single episode, complete remission ([place]) 10/24/2017   Degenerative disc disease, cervical 08/28/2017   Vasovagal syncope 08/28/2017   Dizziness 08/22/2017   History of recent fall 08/22/2017   Hypothyroidism (acquired) 08/13/2017   Loss of memory 04/30/2017   Reaction to severe stress 02/14/2017   Pseudoarthrosis of cervical spine ([place]) 11/13/2016   Asthma 07/05/2016   Acute chest wall pain 07/05/2016   GERD (gastroesophageal reflux disease) 07/05/2016   Spontaneous pneumothorax    Medication overuse headache 04/19/2016   Chronic tension-type headache, not intractable 04/19/2016   Numbness and tingling 04/19/2016   Dysphagia 03/07/2016   Vitamin D deficiency 02/07/2016   Bilateral hand pain 02/06/2016   Cervical stenosis of spinal  canal 08/06/2014   Muscle weakness (generalized) 11/12/2013   Tight fascia 11/12/2013   Decreased range of motion of shoulder 11/12/2013   Cervical spondylosis without myelopathy 10/29/2013   S/P carpal tunnel release 02/19/2013   CTS (carpal tunnel syndrome) 02/19/2013   SHOULDER PAIN 10/06/2007   IMPINGEMENT SYNDROME 10/06/2007   RUPTURE ROTATOR CUFF 10/06/2007   HTN (hypertension) 10/03/2007    PCP: Dion Body, MD   REFERRING PROVIDER: Vara Guardian, MD   REFERRING DIAG:  N64.4 (ICD-10-CM) - Mastodynia   THERAPY DIAG:  Neck pain, bilateral  Pain of both breasts  Muscle weakness (generalized)  Abnormal posture  Rationale for Evaluation and Treatment: Rehabilitation  ONSET DATE: Breast pain: since the  surgery in 2021, Neck pain: for many years   RED FLAGS: N/A  Have you had any signs of dizziness? Any intense headaches? Unexplained numbness/tingling down the arm?  Any signs of head instability in sitting?   SUBJECTIVE:                                                                                                                                                                                      PRECAUTIONS: None  WEIGHT BEARING RESTRICTIONS: No  FALLS:  Has patient fallen in last 6 months? No  OCCUPATION/SOCIAL ACTIVITIES: On disability, walking, reading, enjoying time with family    PLOF: Independent  PERTINENT HISTORY/CHART REVIEW: Siginificant history of cervical surgeries (decompressions/fusions) both anteriorly and posteriorly (2016-2018) Hx of L shoulder surgery and L acromioplasty Breast reduction surgery March 2021    CHIEF CONCERN: Pt is concerned with her severe pain and is unable to participate in many activities of daily living like cleaning, washing dishes, cooking without pain in the neck and B breasts. Pt is taking narcotics currently and gabapentin which makes her very tired and she feels she has to sleep off the medication which does not allow her to participate in many activities. Pt states her husband is also disabled and she has to take care of him. Pt reports having to lift/carry and pull oxygen tanks for her husband. Her neck pain is almost constant and she feels she is unable to move it the way she should. After the neck surgeries, Pt did attend physical therapy. Pt is able to drive and reports having no problems checking blind spots as she turns her whole body. The pain in the breast is located bilaterally but at times feels more in the L breast. The L breast pain is described as a sharp "shooting" pain and it goes from one breast to the other. When the breast pain is severe, she is unable to reach overhead into cabinets. Pt  has no problems with grooming,  showering, or reaching behind to put on bra. Pt does state the Dr telling her to get fitted properly for a supportive bra. Pt has noticed the breast pain is diminished when she does not wear a bra at night.    PAIN:  Are you having pain? Yes NPRS scale: 7/10 (worst), 3/10 (best) Pain location: breast pain   Pain type: sharp and throbbing Pain description: intermittent, sharp, and stabbing   Aggravating factors: reaching overhead Relieving factors: lying down     Neck pain: 9/10 (worst), 8/10 (best), constant, throbbing  AGG factors: moving her neck in different directions, cold, carrying heavy weighted objects (oxygen tanks)  Relieving factors: nothing, lying down/sleeping   LIVING ENVIRONMENT: Lives with: lives with their spouse Lives in: House/apartment   PATIENT GOALS: To relieve the pain or minimize the pain where I can do certain activities such as: reading, vacuuming, cleaning without limitation.     OBJECTIVE:    COGNITION: Overall cognitive status: Within functional limits for tasks assessed     POSTURE:  In sitting and standing, B rounded shoulders   Lumbar lordosis: Deferred 2/2 time constraints   Thoracic kyphosis: Iliac crest height:  Lumbar lateral shift:  Pelvic obliquity:  Leg length discrepancy:     SENSATION:  Deferred 2/2 time constraints  Light touch: , L2-S2 dermatomes  Proprioception:    RANGE OF MOTION: Will assess with inclinometer next session due to time constraints   (Norm range in degrees)  LEFT 01/31/22 RIGHT 01/31/22  Neck flexion:  Restricted*     Neck extension:  Restricted*     Neck lateral flexion:  Restricted* Restricted   Neck rotation:   Restricted Restricted   Shoulder flexion   [facility]  Shoulder abduction   [facility]  Shoulder IR  [facility]  Shoulder ER  [facility] WFL  Elbow flexion   WFL WFL  Elbow extension   [facility] WFL  (*= pain, Blank rows = not tested)   STRENGTH: MMT  Will finish MMT next session 2/2 time constraints    RLE 01/31/22 LLE 01/31/22  Shoulder flexion  4 4  Shoulder abduction  4 4  Elbow flexion  5 5  Elbow extension  5 5  Neck flexion  5 5  Neck extension    Shoulder IR    Shoulder ER    (*= pain, Blank rows = not tested)     SPECIAL TESTS: Deferred 2/2 time constraints  Vertebral artery- Sharp Purser Test- Spurling's Test- Cervical Distraction-   PALPATION: Abdominal:  Scar mobility: present under B breasts, scar extending from under arm to sternum  Painful upon superficial palpation   Rib flare:    CERVICAL SEGMENTAL MOBILITY    SCAPULOTHORACIC MOVEMENT       Patient Education:  Patient educated on what to expect during course of physical therapy, POC, and provided with education on scar tissue restriction causing pain, how manual techniques can aid in decreasing pain and scar tissue restriction, addressing the cervical spine limited ROM and soft tissue mobilization that can also aid in decreasing pain. Patient verbalized understanding and returned demonstration. Patient will benefit from further education in order to maximize compliance and understanding for long-term therapeutic gains.  Patient Surveys:  [facility] - will assess next visit     ASSESSMENT:  Clinical Impression: Patient is a 61 y.o. who was seen today for physical therapy evaluation and treatment for a chief concern of breast pain and neck pain.  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 Today's evaluation suggest deficits in strength, ROM, posture, strength, pain, endurance, scar mobility as evidenced by increased B rounded shoulders in sitting/standing, restricted cervical ROM in flexion/extension/rotation/lateral rotation, pain with cervical ROM in flexion/extension/L lateral flexion, worst breast pain 7/10 (NPRS scale), worst neck pain (9/10), sensitivity to cold which increases neck pain, constant throbbing neck pain inhibiting movement/carrying objects, and UE weakness in shoulder flexion/abd 4/5 MMT.  Patient's responses on FOTO will be collected next visit. Patient's progress may be limited due to significant history of cervical surgeries and time since onset of both breast and cervical pain; however, patient's motivation is advantageous. Pt with basic understanding of scar tissue restriction in relation to pain, posture affecting pain, and cervical weakness/ROM potentially contributing to pain as well. Patient will benefit from skilled therapeutic intervention to address deficits in strength, ROM, posture, strength, pain, endurance, scar mobility in order to increase PLOF and improve overall QOL.    Objective Impairments: decreased activity tolerance, decreased endurance, decreased ROM, decreased strength, hypomobility, increased fascial restrictions, impaired sensation, improper body mechanics, postural dysfunction, and pain.   Activity Limitations: carrying, lifting, sitting, sleeping, dressing, reach over head, locomotion level, and caring for others  Personal Factors: Age, Behavior pattern, Past/current experiences, Time since onset of injury/illness/exacerbation, and 1-2 comorbidities: HTN, depression  are also affecting patient's functional outcome.   Rehab Potential: Good  Clinical Decision Making: Unstable/unpredictable  Evaluation Complexity: High   GOALS: Goals reviewed with patient? Yes  SHORT TERM GOALS: Target date: 02/23/22  Patient will demonstrate independence with HEP in order to maximize therapeutic gains and improve carryover from physical therapy sessions to ADLs in the home and community. Baseline: will give next visit  Goal status: INITIAL    LONG TERM GOALS: Target date: 03/15/2022  Patient will decrease worst pain as reported on NPRS by at least 2 points to demonstrate clinically significant reduction in pain in order to restore/improve function and overall QOL. Baseline: 7/10 (breasts), 9/10 (neck) Goal status: INITIAL  2.  Patient will be able to  demonstrate improved cervical ROM in all directions to allow for participation in activities such as, but not limited to: driving, sitting upright for long periods, reading, and caring for husband without limitations in order to signify overall improvement of chief concern.  Baseline: restricted in flex/ext/rot/lat flex Goal status: INITIAL  3.  Patient will demonstrate understanding of basic self-management/down-regulation of the nervous system for persistent pain condition and stress as evidenced by diaphragmatic breathing without cueing and body scan/progressive relaxation meditation, in order to transition to independent management of patient's chief complaint:breat pain and neck pain. Baseline: Will assess next visit  Goal status: INITIAL  4.  Patient will be able to participate in ADLs such as, but not limited to: reaching over head, sitting for long periods of time, carrying/lifting objects without limitation due to pain to demonstrate improved UE muscular endurance and strength and participate fully in the home and in the community.  Baseline: has pain with above activities and has to lie down after due to pain Goal status: INITIAL  5.  Patient will be able to demonstrate improved postural alignment in standing and sitting and overall improved muscular strength in order to reduce pain and complete resolution of chief concern: breast and neck pain. Baseline: increased B rounded shoulders, increased thoracic kyphosis Goal status: INITIAL    PLAN: PT Frequency: 1-2x/week  PT Duration: 6 weeks  Planned Interventions: Therapeutic exercises, Therapeutic activity, Neuromuscular re-education,  Balance training, Gait training, Patient/Family education, Joint mobilization, Spinal mobilization, Cryotherapy, Moist heat, scar mobilization, Taping, and Manual therapy  Plan For Next Session: finish physical assessment, cervical retractions, scar massage  Elleah Hemsley, PT, DPT  02/01/2022, 12:11  PM

## 2022-02-05 ENCOUNTER — Other Ambulatory Visit: Payer: Self-pay | Admitting: Urology

## 2022-02-05 DIAGNOSIS — R300 Dysuria: Secondary | ICD-10-CM

## 2022-02-07 ENCOUNTER — Ambulatory Visit: Payer: Medicare Other

## 2022-02-07 DIAGNOSIS — N644 Mastodynia: Secondary | ICD-10-CM

## 2022-02-07 DIAGNOSIS — M542 Cervicalgia: Secondary | ICD-10-CM | POA: Diagnosis not present

## 2022-02-07 DIAGNOSIS — M6281 Muscle weakness (generalized): Secondary | ICD-10-CM

## 2022-02-07 DIAGNOSIS — R293 Abnormal posture: Secondary | ICD-10-CM

## 2022-02-07 NOTE — Therapy (Signed)
OUTPATIENT PHYSICAL THERAPY FEMALE PELVIC TREATMENT   Patient Name: Kimberly [name] MRN: [id number] DOB:Feb 06, 1961, 61 y.o., female Today's Date: 02/07/2022   PT End of Session - 02/07/22 1400     Visit Number 2    Number of Visits 12    Date for PT Re-Evaluation 03/15/22    PT Start Time 1400    PT Stop Time 1443    PT Time Calculation (min) 43 min    Activity Tolerance Patient tolerated treatment well             Past Medical History:  Diagnosis Date   Anxiety    takes alprazolam - rare use    Arthritis    cervical spondylosis    Asthma    Balance problem    Brain cyst    Carpal tunnel syndrome, bilateral    Cervical fusion syndrome    Complete rupture of rotator cuff    Complication of anesthesia    lung collapsed after neck fusion   Degenerative disc disease, cervical    Depression    Difficult intubation    Small mouth opening, limited neck flexion, very anterior    Flu 08/10/2018   tested positive   GERD (gastroesophageal reflux disease)    pt. reports that its better, no meds in use at this time- 2015   Headache    Herpes    History of kidney stones    History of pneumonia    Hyperlipidemia    Hypertension    pt. doesn't see cardiologist, followed for HTN by Dr. Gerarda Fraction   Hypothyroidism    Inflammation of shoulder joint    Memory difficulties    Neuromuscular disorder ([place])    joint and muscle problems   Occipital neuralgia    Pneumothorax on right    following C4-6 ACDF 07/04/16   Pseudoarthrosis of cervical spine (HCC)    Recurrent falls    Syncope    Urinary frequency    Past Surgical History:  Procedure Laterality Date   ABDOMINAL HYSTERECTOMY     ANTERIOR CERVICAL DECOMP/DISCECTOMY FUSION N/A 08/06/2014   Procedure: ANTERIOR CERVICAL DECOMPRESSION/DISCECTOMY FUSION CERVICAL THREE-FOUR,CERVICAL SIX-SEVEN ,CERVICAL SEVEN-THORACIC ONE;  Surgeon: Floyce Stakes, MD;  Location: [facility];  Service: Neurosurgery;  Laterality: N/A;   ANTERIOR  CERVICAL DECOMP/DISCECTOMY FUSION N/A 07/04/2016   Procedure: CERVICAL FOUR-FIVE, CERVICAL FIVE-SIX ANTERIOR CERVICAL DECOMPRESSION/DISCECTOMY/FUSION WITH REVISION OF CERVICAL THREE-FOUR PLATE;  Surgeon: Leeroy Cha, MD;  Location: [facility];  Service: Neurosurgery;  Laterality: N/A;   CARPAL TUNNEL RELEASE Left 02/16/2013   Procedure: CARPAL TUNNEL RELEASE;  Surgeon: Carole Civil, MD;  Location: [facility];  Service: Orthopedics;  Laterality: Left;   CARPAL TUNNEL RELEASE Right 03/27/2013   Procedure: RIGHT CARPAL TUNNEL RELEASE;  Surgeon: Carole Civil, MD;  Location: [facility];  Service: Orthopedics;  Laterality: Right;   CESAREAN SECTION     x2   CHOLECYSTECTOMY N/A 06/17/2015   Procedure: LAPAROSCOPIC CHOLECYSTECTOMY;  Surgeon: Aviva Signs, MD;  Location: [facility];  Service: General;  Laterality: N/A;   COLONOSCOPY     COLONOSCOPY WITH PROPOFOL N/A 05/06/2019   Procedure: COLONOSCOPY WITH PROPOFOL;  Surgeon: Robert Bellow, MD;  Location: [facility];  Service: Endoscopy;  Laterality: N/A;   CYSTO WITH HYDRODISTENSION N/A 09/26/2021   Procedure: CYSTOSCOPY/HYDRODISTENSION;  Surgeon: Abbie Sons, MD;  Location: [facility];  Service: Urology;  Laterality: N/A;   CYSTOSCOPY W/ RETROGRADES Bilateral 09/26/2021   Procedure: CYSTOSCOPY WITH RETROGRADE PYELOGRAM;  Surgeon: Bernardo Heater,  Ronda Fairly, MD;  Location: [facility];  Service: Urology;  Laterality: Bilateral;   CYSTOSCOPY WITH BIOPSY N/A 09/26/2021   Procedure: CYSTOSCOPY WITH BIOPSY;  Surgeon: Abbie Sons, MD;  Location: [facility];  Service: Urology;  Laterality: N/A;   ESOPHAGOGASTRODUODENOSCOPY N/A 05/08/2021   Procedure: ESOPHAGOGASTRODUODENOSCOPY (EGD);  Surgeon: Lesly Rubenstein, MD;  Location: [facility];  Service: Endoscopy;  Laterality: N/A;   ESOPHAGOGASTRODUODENOSCOPY  11/25/2017   EXCISION VAGINAL CYST N/A 12/22/2021   Procedure: EXCISION VULVAR CYST;  Surgeon: Benjaman Kindler, MD;  Location: [facility];  Service:  Gynecology;  Laterality: N/A;   EXTRACORPOREAL SHOCK WAVE LITHOTRIPSY Left 09/04/2018   Procedure: EXTRACORPOREAL SHOCK WAVE LITHOTRIPSY (ESWL);  Surgeon: Billey Co, MD;  Location: [facility];  Service: Urology;  Laterality: Left;   FOREIGN BODY REMOVAL Left    knee-as child   KNEE ARTHROSCOPY     NASAL SINUS SURGERY N/A 04/13/2015   Procedure: nasal endoscopy with adenoid biopsy;  Surgeon: Ruby Cola, MD;  Location: [facility];  Service: ENT;  Laterality: N/A;   NECK SURGERY  2016   x 3 all together   POSTERIOR CERVICAL FUSION/FORAMINOTOMY N/A 11/13/2016   Procedure: CERVICAL TWO-CERVICAL SIX POSTERIOR CERVICAL FUSION WITH LATERAL MASS FIXATION;  Surgeon: Kristeen Miss, MD;  Location: [place];  Service: Neurosurgery;  Laterality: N/A;  posterior approach   REDUCTION MAMMAPLASTY  10/2019   ROTATOR CUFF REPAIR Left 2014   SHOULDER ACROMIOPLASTY Left 05/18/2015   Procedure: SHOULDER ACROMIOPLASTY;  Surgeon: Earlie Server, MD;  Location: [facility];  Service: Orthopedics;  Laterality: Left;   SHOULDER ARTHROSCOPY WITH DISTAL CLAVICLE RESECTION Left 05/18/2015   Procedure: LEFT SHOULDER ARTHROSCOPY WITH  DISTAL CLAVICLE RESECTION;  Surgeon: Earlie Server, MD;  Location: [place];  Service: Orthopedics;  Laterality: Left;   URETEROSCOPY Bilateral 09/26/2021   Procedure: URETEROSCOPY;  Surgeon: Abbie Sons, MD;  Location: [facility];  Service: Urology;  Laterality: Bilateral;   XI ROBOTIC ASSISTED OOPHORECTOMY N/A 12/22/2021   Procedure: XI ROBOTIC ASSISTED DIAGNOSTIC LAPAROSCOPY, LEFT OOPHORECTOMY, WITH RIGHT SALPINGECTOMY AND LYSIS OF ADHESIONS;  Surgeon: Benjaman Kindler, MD;  Location: [facility];  Service: Gynecology;  Laterality: N/A;   Patient Active Problem List   Diagnosis Date Noted   Retroperitoneal lymphadenopathy 08/13/2021   Left lower quadrant abdominal pain 08/11/2021   Occipital headache 12/29/2019   Cervical myofascial pain syndrome 09/22/2019    Recurrent herpes simplex 07/16/2018   Pelvic pain in female 05/29/2018   Chronic pain syndrome 04/17/2018   Cervicalgia 04/17/2018   Controlled substance agreement signed 04/17/2018   Cervical fusion syndrome 03/24/2018   Hx of cervical spine surgery 03/24/2018   Depression, major, recurrent, mild ([place]) 11/21/2017   Poorly-controlled hypertension 11/21/2017   Depression, major, single episode, complete remission ([place]) 10/24/2017   Degenerative disc disease, cervical 08/28/2017   Vasovagal syncope 08/28/2017   Dizziness 08/22/2017   History of recent fall 08/22/2017   Hypothyroidism (acquired) 08/13/2017   Loss of memory 04/30/2017   Reaction to severe stress 02/14/2017   Pseudoarthrosis of cervical spine ([place]) 11/13/2016   Asthma 07/05/2016   Acute chest wall pain 07/05/2016   GERD (gastroesophageal reflux disease) 07/05/2016   Spontaneous pneumothorax    Medication overuse headache 04/19/2016   Chronic tension-type headache, not intractable 04/19/2016   Numbness and tingling 04/19/2016   Dysphagia 03/07/2016   Vitamin D deficiency 02/07/2016   Bilateral hand pain 02/06/2016   Cervical stenosis of spinal canal 08/06/2014   Muscle weakness (generalized)  11/12/2013   Tight fascia 11/12/2013   Decreased range of motion of shoulder 11/12/2013   Cervical spondylosis without myelopathy 10/29/2013   S/P carpal tunnel release 02/19/2013   CTS (carpal tunnel syndrome) 02/19/2013   SHOULDER PAIN 10/06/2007   IMPINGEMENT SYNDROME 10/06/2007   RUPTURE ROTATOR CUFF 10/06/2007   HTN (hypertension) 10/03/2007    PCP: Dion Body, MD   REFERRING PROVIDER: Vara Guardian, MD   REFERRING DIAG:  N64.4 (ICD-10-CM) - Mastodynia   THERAPY DIAG:  Neck pain, bilateral  Pain of both breasts  Muscle weakness (generalized)  Abnormal posture  Rationale for Evaluation and Treatment: Rehabilitation  ONSET DATE: Breast pain: since the surgery in 2021, Neck pain: for many years     SUBJECTIVE:                                                                                                                                                                                      PRECAUTIONS: None  WEIGHT BEARING RESTRICTIONS: No  FALLS:  Has patient fallen in last 6 months? No  OCCUPATION/SOCIAL ACTIVITIES: On disability, walking, reading, enjoying time with family    PLOF: Independent  PERTINENT HISTORY/CHART REVIEW: Siginificant history of cervical surgeries (decompressions/fusions) both anteriorly and posteriorly (2016-2018) Hx of L shoulder surgery and L acromioplasty Breast reduction surgery March 2021    CHIEF CONCERN: Pt is concerned with her severe pain and is unable to participate in many activities of daily living like cleaning, washing dishes, cooking without pain in the neck and B breasts. Pt is taking narcotics currently and gabapentin which makes her very tired and she feels she has to sleep off the medication which does not allow her to participate in many activities. Pt states her husband is also disabled and she has to take care of him. Pt reports having to lift/carry and pull oxygen tanks for her husband. Her neck pain is almost constant and she feels she is unable to move it the way she should. After the neck surgeries, Pt did attend physical therapy. Pt is able to drive and reports having no problems checking blind spots as she turns her whole body. The pain in the breast is located bilaterally but at times feels more in the L breast. The L breast pain is described as a sharp "shooting" pain and it goes from one breast to the other. When the breast pain is severe, she is unable to reach overhead into cabinets. Pt has no problems with grooming, showering, or reaching behind to put on bra. Pt does state the Dr telling her to get fitted properly for a supportive bra. Pt has noticed the breast pain is  diminished when she does not wear a bra at night.    Aggravating factors: reaching overhead Relieving factors: lying down     Neck pain: 9/10 (worst), 8/10 (best), constant, throbbing  AGG factors: moving her neck in different directions, cold, carrying heavy weighted objects (oxygen tanks)  Relieving factors: nothing, lying down/sleeping   LIVING ENVIRONMENT: Lives with: lives with their spouse Lives in: House/apartment   PATIENT GOALS: To relieve the pain or minimize the pain where I can do certain activities such as: reading, vacuuming, cleaning without limitation.    SUBJECTIVE:  No changes from last session.    PAIN:  Are you having pain? Yes NPRS scale: 7/10  Pain location: neck    OBJECTIVE:    COGNITION: Overall cognitive status: Within functional limits for tasks assessed     POSTURE:  In sitting and standing, B rounded shoulders   Lumbar lordosis: Deferred 2/2 time constraints   Thoracic kyphosis: Iliac crest height:  Lumbar lateral shift:  Pelvic obliquity:  Leg length discrepancy:     SENSATION:  Deferred 2/2 time constraints  Light touch: , L2-S2 dermatomes  Proprioception:    RANGE OF MOTION: ROM in degrees taken 02/07/22  (Norm range in degrees)  LEFT 01/31/22 RIGHT 01/31/22  Neck flexion:   35     Neck extension:  12    Neck lateral flexion:  10  5  Neck rotation:   15 10  Shoulder flexion   [facility]  Shoulder abduction   [facility]  Shoulder IR  [facility]  Shoulder ER  [facility] WFL  Elbow flexion   [facility] WFL  Elbow extension   [facility] WFL  (*= pain, Blank rows = not tested)   STRENGTH: MMT  Finished on 02/07/22  RUE 01/31/22 LUE 01/31/22  Shoulder flexion  4 4  Shoulder abduction  4 4  Elbow flexion  5 5  Elbow extension  5 5  Neck flexion  5 5  Neck extension    Shoulder IR 5 4  Shoulder ER 5 4  (*= pain, Blank rows = not tested)     SPECIAL TESTS: 02/07/22 Sharp Purser Test- negative Spurling's Test- negative Cervical Distraction- positive, neck pain decreased to 4/10     PALPATION: Abdominal:  Scar mobility: present under B breasts, scar extending from under arm to sternum  Painful upon superficial palpation    TODAY'S TREATMENT:   Pre-treatment assessment: neck ROM measurements, shoulder MMT, neck special tests   Manual Therapy: STM at suboccipitals, posterior musculature (upper cervical spine spinal musculature) for improved muscular tension and pain   Cervical distraction for improved spinal mobility Neck pain decreased to 4/10 with distraction   Scar mobilization at R breast scar for improved extensibility, mobility and pain modulation  Increased tenderness with superficial mobilization but improved with increased time  Scar restriction further along scar right under R breast    Neuromuscular Re-education: Supine chin tucks for improved postural stability and pain modulation, x15, VCs and TCs required for proper technique   Supine cervical ROM into rotation for pain modulation as pain limits spinal mobility, Bx5   Seated cervical SNAGs (Sustained Natural Apophyseal Glides) B for pain modulation as pain limits spinal mobility, B x5   Patient response to interventions: Pt reports neck felt better after manual interventions and feels she has been tense over the years from all the surgical interventions and pain.    Patient Education:  Patient provided with HEP: cervical ROM, SNAGs, and scar massage  technique handout to continue practicing at home. Patient educated throughout session on appropriate technique and form using multi-modal cueing, HEP, and activity modification.  Patient will continue benefit from further education in order to maximize compliance and understanding for long-term therapeutic gains.  Patient Surveys:  [facility] - will assess next visit     ASSESSMENT:  Clinical Impression: Patient presents to clinic with excellent motivation to participate in today's session. Pt continues to demonstrate deficits in strength,  ROM, posture, strength, pain, endurance, scar mobility. Upon cervical ROM measurements, Pt is restricted in cervical ROM in all directions: flex/ext/lat flex/rot. Pt with 7/10 neck pain at beginning of session with no breast pain. Pt with increased tenderness and tension along the  posterior musculature of the cervical spine and suboccipitals. Pt with decreased pain, 4/10, with cervical distraction. During scar tissue mobilization, Pt with significant scar restriction at the R breast causing tenderness upon palpation and discomfort. With increased time of manual technique, discomfort eased. Pt responded well to all active, manual, and educational interventions. Patient will continue to benefit from skilled therapeutic intervention to address deficits in strength, ROM, posture, strength, pain, endurance, scar mobility in order to increase PLOF and improve overall QOL.    Objective Impairments: decreased activity tolerance, decreased endurance, decreased ROM, decreased strength, hypomobility, increased fascial restrictions, impaired sensation, improper body mechanics, postural dysfunction, and pain.   Activity Limitations: carrying, lifting, sitting, sleeping, dressing, reach over head, locomotion level, and caring for others  Personal Factors: Age, Behavior pattern, Past/current experiences, Time since onset of injury/illness/exacerbation, and 1-2 comorbidities: HTN, depression  are also affecting patient's functional outcome.   Rehab Potential: Good  Clinical Decision Making: Unstable/unpredictable  Evaluation Complexity: High   GOALS: Goals reviewed with patient? Yes  SHORT TERM GOALS: Target date: 02/23/22  Patient will demonstrate independence with HEP in order to maximize therapeutic gains and improve carryover from physical therapy sessions to ADLs in the home and community. Baseline: will give next visit  Goal status: INITIAL    LONG TERM GOALS: Target date: 03/15/2022  Patient will  decrease worst pain as reported on NPRS by at least 2 points to demonstrate clinically significant reduction in pain in order to restore/improve function and overall QOL. Baseline: 7/10 (breasts), 9/10 (neck) Goal status: INITIAL  2.  Patient will be able to demonstrate improved cervical ROM in all directions to allow for participation in activities such as, but not limited to: driving, sitting upright for long periods, reading, and caring for husband without limitations in order to signify overall improvement of chief concern.  Baseline: restricted in flex/ext/rot/lat flex Goal status: INITIAL  3.  Patient will demonstrate understanding of basic self-management/down-regulation of the nervous system for persistent pain condition and stress as evidenced by diaphragmatic breathing without cueing and body scan/progressive relaxation meditation, in order to transition to independent management of patient's chief complaint:breat pain and neck pain. Baseline: Will assess next visit  Goal status: INITIAL  4.  Patient will be able to participate in ADLs such as, but not limited to: reaching over head, sitting for long periods of time, carrying/lifting objects without limitation due to pain to demonstrate improved UE muscular endurance and strength and participate fully in the home and in the community.  Baseline: has pain with above activities and has to lie down after due to pain Goal status: INITIAL  5.  Patient will be able to demonstrate improved postural alignment in standing and sitting and overall improved muscular strength in order to reduce pain  and complete resolution of chief concern: breast and neck pain. Baseline: increased B rounded shoulders, increased thoracic kyphosis Goal status: INITIAL    PLAN: PT Frequency: 1-2x/week  PT Duration: 6 weeks  Planned Interventions: Therapeutic exercises, Therapeutic activity, Neuromuscular re-education, Balance training, Gait training,  Patient/Family education, Joint mobilization, Spinal mobilization, Cryotherapy, Moist heat, scar mobilization, Taping, and Manual therapy  Plan For Next Session: dedicate to scar mob (breast), or neck, then pick what to do from there   [facility], PT, DPT  02/07/2022, 5:21 PM

## 2022-02-09 ENCOUNTER — Ambulatory Visit: Payer: Medicare Other

## 2022-02-13 ENCOUNTER — Encounter: Payer: Self-pay | Admitting: Student in an Organized Health Care Education/Training Program

## 2022-02-13 ENCOUNTER — Ambulatory Visit
Payer: Medicare Other | Attending: Student in an Organized Health Care Education/Training Program | Admitting: Student in an Organized Health Care Education/Training Program

## 2022-02-13 ENCOUNTER — Ambulatory Visit: Payer: Medicare Other

## 2022-02-13 VITALS — BP 116/86 | HR 88 | Temp 96.7°F | Resp 16 | Ht 60.0 in | Wt 145.0 lb

## 2022-02-13 DIAGNOSIS — Q761 Klippel-Feil syndrome: Secondary | ICD-10-CM | POA: Insufficient documentation

## 2022-02-13 DIAGNOSIS — M47812 Spondylosis without myelopathy or radiculopathy, cervical region: Secondary | ICD-10-CM | POA: Insufficient documentation

## 2022-02-13 DIAGNOSIS — M5030 Other cervical disc degeneration, unspecified cervical region: Secondary | ICD-10-CM | POA: Diagnosis present

## 2022-02-13 DIAGNOSIS — M5481 Occipital neuralgia: Secondary | ICD-10-CM | POA: Insufficient documentation

## 2022-02-13 DIAGNOSIS — M7918 Myalgia, other site: Secondary | ICD-10-CM | POA: Insufficient documentation

## 2022-02-13 DIAGNOSIS — G894 Chronic pain syndrome: Secondary | ICD-10-CM | POA: Insufficient documentation

## 2022-02-13 DIAGNOSIS — Z9889 Other specified postprocedural states: Secondary | ICD-10-CM | POA: Diagnosis not present

## 2022-02-13 MED ORDER — OXYCODONE HCL 10 MG PO TABS: 10.0000 mg | ORAL_TABLET | Freq: Three times a day (TID) | ORAL | 0 refills | Status: DC | PRN

## 2022-02-13 MED ORDER — OXYCODONE HCL 10 MG PO TABS: 10.0000 mg | ORAL_TABLET | Freq: Three times a day (TID) | ORAL | 0 refills | Status: AC | PRN

## 2022-02-13 MED ORDER — GABAPENTIN 600 MG PO TABS: 600.0000 mg | ORAL_TABLET | Freq: Three times a day (TID) | ORAL | 5 refills | Status: DC

## 2022-02-13 NOTE — Progress Notes (Signed)
PROVIDER NOTE: Information contained herein reflects review and annotations entered in association with encounter. Interpretation of such information and data should be left to medically-trained personnel. Information provided to patient can be located elsewhere in the medical record under "Patient Instructions". Document created using STT-dictation technology, any transcriptional errors that may result from process are unintentional.    Patient: Kimberly [name]  Service Category: E/M  Provider: Gillis Santa, MD  DOB: 1961/04/21  DOS: 02/13/2022  Specialty: Interventional Pain Management  MRN: [id number]  Setting: Ambulatory outpatient  PCP: Dion Body, MD  Type: Established Patient    Referring Provider: Dion Body, MD  Location: Office  Delivery: Face-to-face     HPI  Kimberly [name], a 61 y.o. year old female, is here today because of her Cervical spondylosis without myelopathy [M47.812]. Kimberly [name] primary complain today is Neck Pain  Last encounter: My last encounter with her was on 11/21/21  Pertinent problems: Kimberly [name] has SHOULDER PAIN; IMPINGEMENT SYNDROME; RUPTURE ROTATOR CUFF; CTS (carpal tunnel syndrome); Cervical spondylosis without myelopathy; Cervical stenosis of spinal canal; Pseudoarthrosis of cervical spine ([place]); Numbness and tingling; Vitamin D deficiency; Cervical fusion syndrome; Hx of cervical spine surgery; Chronic pain syndrome; Cervicalgia; Controlled substance agreement signed; Depression, major, single episode, complete remission ([name]); Cervical myofascial pain syndrome; and Occipital headache on their pertinent problem list. Pain Assessment: Severity of Chronic pain is reported as a 7 /10. Location: Neck Mid/up to occiput. Onset: More than a month ago. Quality: Aching, Constant, Pounding. Timing: Constant. Modifying factor(s): meds, heat. Vitals:  height is 5' (1.524 m) and weight is 145 lb (65.8 kg). Her temporal temperature is 96.7 F (35.9 C)  (abnormal). Her blood pressure is 116/86 and her pulse is 88. Her respiration is 16 and oxygen saturation is 99%.   Reason for encounter: medication management.    No change in medical history since last visit.  Patient's pain is at baseline.  Patient continues multimodal pain regimen as prescribed.  States that it provides pain relief and improvement in functional status. States that she had a headache for a couple of days due to an antibiotic, Bactrim that they started for an abscess of her left buttock. Refill of gabapentin and oxycodone as below.  We will also renew our annual urine toxicology screen for medication compliance monitoring.   Pharmacotherapy Assessment  Analgesic: Oxycodone 10 mg every 8 hours as needed, quantity 90/month; MME equals 45    Monitoring: [place] PMP: PDMP reviewed during this encounter.       Pharmacotherapy: No side-effects or adverse reactions reported. Compliance: No problems identified. Effectiveness: Clinically acceptable.  UDS:  Summary  Date Value Ref Range Status  02/08/2021 Note  Final    Comment:    ==================================================================== [employer] Select 13 (MW) ==================================================================== Test                             Result       Flag       Units  Drug Present and Declared for Prescription Verification   Oxycodone                      1168         EXPECTED   ng/mg creat   Oxymorphone                    2167         EXPECTED  ng/mg creat   Noroxycodone                   1622         EXPECTED   ng/mg creat   Noroxymorphone                 560          EXPECTED   ng/mg creat    Sources of oxycodone are scheduled prescription medications.    Oxymorphone, noroxycodone, and noroxymorphone are expected    metabolites of oxycodone. Oxymorphone is also available as a    scheduled prescription medication.  Drug Absent but Declared for Prescription Verification   Diazepam                        Not Detected UNEXPECTED ng/mg creat   Alprazolam                     Not Detected UNEXPECTED ng/mg creat ==================================================================== Test                      Result    Flag   Units      Ref Range   Creatinine              183              mg/dL      >=20 ==================================================================== Declared Medications:  The flagging and interpretation on this report are based on the  following declared medications.  Unexpected results may arise from  inaccuracies in the declared medications.   **Note: The testing scope of this panel includes these medications:   Alprazolam (Xanax)  Diazepam (Valium)  Oxycodone   **Note: The testing scope of this panel does not include the  following reported medications:   Acetaminophen (Tylenol)  Acyclovir  Amlodipine  Cholecalciferol  Duloxetine (Cymbalta)  Escitalopram (Lexapro)  Estradiol (Estrace)  Eye Drop  Gabapentin  Liothyronine (Cytomel)  Nortriptyline (Pamelor)  Nystatin  Pantoprazole (Protonix) ==================================================================== For clinical consultation, please call [phone]. ====================================================================       ROS  Constitutional: Denies any fever or chills +headaches, occipital dominant Gastrointestinal: No reported hemesis, hematochezia, vomiting, or acute GI distress Musculoskeletal: Denies any acute onset joint swelling, redness, loss of ROM, or weakness Neurological: No reported episodes of acute onset apraxia, aphasia, dysarthria, agnosia, amnesia, paralysis, loss of coordination, or loss of consciousness  Medication Review  NONFORMULARY OR COMPOUNDED ITEM, Oxycodone HCl, Tetrahydrozoline HCl, Uribel, acetaminophen, amLODipine, amitriptyline, cholecalciferol, estradiol, gabapentin, hydrOXYzine, meloxicam, pantoprazole, rizatriptan, and valACYclovir  History  Review  Allergy: Kimberly [name] is allergic to shellfish allergy, peanut-containing drug products, codeine, diphtheria toxoid, and tetanus toxoids. Drug: Kimberly [name]  reports no history of drug use. Alcohol:  reports current alcohol use. Tobacco:  reports that she has never smoked. She has never used smokeless tobacco. Social: Kimberly [name]  reports that she has never smoked. She has never used smokeless tobacco. She reports current alcohol use. She reports that she does not use drugs. Medical:  has a past medical history of Anxiety, Arthritis, Asthma, Balance problem, Brain cyst, Carpal tunnel syndrome, bilateral, Cervical fusion syndrome, Complete rupture of rotator cuff, Complication of anesthesia, Degenerative disc disease, cervical, Depression, Difficult intubation, Flu (08/10/2018), GERD (gastroesophageal reflux disease), Headache, Herpes, History of kidney stones, History of pneumonia, Hyperlipidemia, Hypertension, Hypothyroidism, Inflammation of shoulder joint, Memory difficulties, Neuromuscular disorder ([place]), Occipital neuralgia, Pneumothorax on right, Pseudoarthrosis of  cervical spine ([place]), Recurrent falls, Syncope, and Urinary frequency. Surgical: Kimberly [name]  has a past surgical history that includes Abdominal hysterectomy; Rotator cuff repair (Left, 2014); Foreign Body Removal (Left); Carpal tunnel release (Left, 02/16/2013); Carpal tunnel release (Right, 03/27/2013); Cesarean section; Anterior cervical decomp/discectomy fusion (N/A, 08/06/2014); Colonoscopy; Nasal sinus surgery (N/A, 04/13/2015); Shoulder arthroscopy with distal clavicle resection (Left, 05/18/2015); Shoulder acromioplasty (Left, 05/18/2015); Cholecystectomy (N/A, 06/17/2015); Neck surgery (2016); Anterior cervical decomp/discectomy fusion (N/A, 07/04/2016); Posterior cervical fusion/foraminotomy (N/A, 11/13/2016); Knee arthroscopy; Extracorporeal shock wave lithotripsy (Left, 09/04/2018); Colonoscopy with propofol (N/A,  05/06/2019); Reduction mammaplasty (10/2019); Esophagogastroduodenoscopy (N/A, 05/08/2021); cysto with hydrodistension (N/A, 09/26/2021); Cystoscopy w/ retrogrades (Bilateral, 09/26/2021); Cystoscopy with biopsy (N/A, 09/26/2021); Ureteroscopy (Bilateral, 09/26/2021); Esophagogastroduodenoscopy (11/25/2017); Xi robotic assisted oophorectomy (N/A, 12/22/2021); and Excision vaginal cyst (N/A, 12/22/2021). Family: family history includes Cancer in her father; Hypertension in her father and another family member.  Laboratory Chemistry Profile   Renal Lab Results  Component Value Date   BUN 11 08/26/2021   CREATININE 0.80 11/16/2021   BCR 10 07/01/2019   GFRAA 86 07/01/2019   GFRNONAA >60 08/26/2021     Hepatic Lab Results  Component Value Date   AST 20 08/10/2021   ALT 12 08/10/2021   ALBUMIN 3.3 (L) 08/10/2021   ALKPHOS 58 08/10/2021   LIPASE 34 08/10/2021     Electrolytes Lab Results  Component Value Date   NA 136 08/26/2021   K 3.5 08/26/2021   CL 99 08/26/2021   CALCIUM 9.6 08/26/2021     Bone No results found for: "VD25OH", "VD125OH2TOT", "PO2423NT6", "RW4315QM0", "25OHVITD1", "25OHVITD2", "25OHVITD3", "TESTOFREE", "TESTOSTERONE"   Inflammation (CRP: Acute Phase) (ESR: Chronic Phase) Lab Results  Component Value Date   ESRSEDRATE 35 (H) 08/12/2021       Note: Above Lab results reviewed.  Recent Imaging Review  MM DIAG BREAST TOMO BILATERAL CLINICAL DATA:  Nonfocal bilateral breast pain since breast reduction in 2021. Not significantly changed since prior diagnostic mammogram in 2022.  EXAM: DIGITAL DIAGNOSTIC BILATERAL MAMMOGRAM WITH TOMOSYNTHESIS AND CAD  TECHNIQUE: Bilateral digital diagnostic mammography and breast tomosynthesis was performed. The images were evaluated with computer-aided detection.  COMPARISON:  Previous exam(s).  ACR Breast Density Category b: There are scattered areas of fibroglandular density.  FINDINGS: Diagnostic mammographic  images were obtained over the area of pain in the RIGHT breast. No suspicious mammographic finding is identified in this area. No suspicious mass, microcalcification, or other finding is identified in the RIGHT breast. No suspicious mammographic etiology for breast pain identified. Post reduction changes are noted.  Diagnostic mammographic images were obtained over the area of pain in the LEFT breast. No suspicious mammographic finding is identified in this area. A questioned asymmetry resolves with additional views, consistent with overlapping tissue. No suspicious mass, microcalcification, or other finding is identified in the LEFT breast. No suspicious mammographic etiology for breast pain identified. Post reduction changes are noted.  IMPRESSION: 1. No mammographic evidence of malignancy bilaterally. No suspicious mammographic etiology for nonfocal bilateral breast pain is identified. Any further workup of the patient's symptoms should be based on the clinical assessment. Recommend routine annual screening mammogram in 1 year.  RECOMMENDATION: Screening mammogram in one year.(Code:SM-B-01Y)  I have discussed the findings and recommendations with the patient. If applicable, a reminder letter will be sent to the patient regarding the next appointment.  BI-RADS CATEGORY  2: Benign.  Electronically Signed   By: Valentino Saxon M.D.   On: 01/17/2022 15:12  Note: Reviewed  Physical Exam  General appearance: Well nourished, well developed, and well hydrated. In no apparent acute distress Mental status: Alert, oriented x 3 (person, place, & time)       Respiratory: No evidence of acute respiratory distress Eyes: PERLA Vitals: BP 116/86   Pulse 88   Temp (!) 96.7 F (35.9 C) (Temporal)   Resp 16   Ht 5' (1.524 m)   Wt 145 lb (65.8 kg)   SpO2 99%   BMI 28.32 kg/m  BMI: Estimated body mass index is 28.32 kg/m as calculated from the following:   Height as of  this encounter: 5' (1.524 m).   Weight as of this encounter: 145 lb (65.8 kg). Ideal: Ideal body weight: 45.5 kg (100 lb 4.9 oz) Adjusted ideal body weight: 53.6 kg (118 lb 3 oz)  Cervical Spine Exam  Skin & Axial Inspection: Well healed scar from previous spine surgery detected Alignment: Symmetrical Functional ROM: Pain restricted ROM      Stability: No instability detected Muscle Tone/Strength: Functionally intact. No obvious neuro-muscular anomalies detected. Sensory (Neurological): Neurogenic pain pattern Palpation: No palpable anomalies                          Upper Extremity (UE) Exam      Side: Right upper extremity   Side: Left upper extremity    Skin & Extremity Inspection: Skin color, temperature, and hair growth are WNL. No peripheral edema or cyanosis. No masses, redness, swelling, asymmetry, or associated skin lesions. No contractures.   Skin & Extremity Inspection: Skin color, temperature, and hair growth are WNL. No peripheral edema or cyanosis. No masses, redness, swelling, asymmetry, or associated skin lesions. No contractures.    Functional ROM: Unrestricted ROM           Functional ROM: Unrestricted ROM            Muscle Tone/Strength: Functionally intact. No obvious neuro-muscular anomalies detected.   Muscle Tone/Strength: Functionally intact. No obvious neuro-muscular anomalies detected.    Sensory (Neurological): Unimpaired           Sensory (Neurological): Unimpaired            Palpation: No palpable anomalies               Palpation: No palpable anomalies                Provocative Test(s):  Phalen's test: deferred Tinel's test: deferred Apley's scratch test (touch opposite shoulder):  Action 1 (Across chest): deferred Action 2 (Overhead): deferred Action 3 (LB reach): deferred     Provocative Test(s):  Phalen's test: deferred Tinel's test: deferred Apley's scratch test (touch opposite shoulder):  Action 1 (Across chest): deferred Action 2 (Overhead):  deferred Action 3 (LB reach): deferred       Assessment   Status Diagnosis  Controlled Controlled Controlled 1. Cervical spondylosis without myelopathy   2. Hx of cervical spine surgery   3. Cervical myofascial pain syndrome   4. Cervical fusion syndrome (C2-T1)   5. Degenerative disc disease, cervical   6. Bilateral occipital neuralgia   7. Chronic pain syndrome         Plan of Care   Kimberly [name] has a current medication list which includes the following long-term medication(s): amitriptyline and gabapentin.  Pharmacotherapy (Medications Ordered): Meds ordered this encounter  Medications   Oxycodone HCl 10 MG TABS    Sig: Take 1 tablet (10 mg total) by  mouth every 8 (eight) hours as needed (pain).    Dispense:  90 tablet    Refill:  0   Oxycodone HCl 10 MG TABS    Sig: Take 1 tablet (10 mg total) by mouth every 8 (eight) hours as needed (pain).    Dispense:  90 tablet    Refill:  0   Oxycodone HCl 10 MG TABS    Sig: Take 1 tablet (10 mg total) by mouth every 8 (eight) hours as needed (pain).    Dispense:  90 tablet    Refill:  0   gabapentin (NEURONTIN) 600 MG tablet    Sig: Take 1 tablet (600 mg total) by mouth 3 (three) times daily.    Dispense:  90 tablet    Refill:  5   Orders Placed This Encounter  Procedures   [employer] Select 13 (MW), Urine    Volume: 30 ml(s). Minimum 3 ml of urine is needed. Document temperature of fresh sample. Indications: Long term (current) use of opiate analgesic [phone])    Order Specific Question:   Release to patient    Answer:   Immediate     Follow-up plan:   Return in about 3 months (around 05/24/2022) for Medication Management, in person.     cervical TPI, cervical facet medial branch nerve block, suprascapular nerve block, bilateral C4, C5, C6   cervical facet medial branch nerve block 07/04/2020: Not effective, do not repeat.        Recent Visits Date Type Provider Dept  12/19/21 Office Visit Gillis Santa,  MD [facility]  11/21/21 Office Visit Gillis Santa, MD [facility]  Showing recent visits within past 90 days and meeting all other requirements Today's Visits Date Type Provider Dept  02/13/22 Office Visit Gillis Santa, MD [facility]  Showing today's visits and meeting all other requirements Future Appointments No visits were found meeting these conditions. Showing future appointments within next 90 days and meeting all other requirements  I discussed the assessment and treatment plan with the patient. The patient was provided an opportunity to ask questions and all were answered. The patient agreed with the plan and demonstrated an understanding of the instructions.  Patient advised to call back or seek an in-person evaluation if the symptoms or condition worsens.  Duration of encounter: 30 minutes.  Note by: Gillis Santa, MD Date: 02/13/2022; Time: 3:17 PM

## 2022-02-13 NOTE — Progress Notes (Signed)
Nursing Pain Medication Assessment:  Safety precautions to be maintained throughout the outpatient stay will include: orient to surroundings, keep bed in low position, maintain call bell within reach at all times, provide assistance with transfer out of bed and ambulation.  Medication Inspection Compliance: Pill count conducted under aseptic conditions, in front of the patient. Neither the pills nor the bottle was removed from the patient's sight at any time. Once count was completed pills were immediately returned to the patient in their original bottle.  Medication:  Oxycodone 10 mg Pill/Patch Count:  80 of 90 pills remain Pill/Patch Appearance: Markings consistent with prescribed medication Bottle Appearance: Standard pharmacy container. Clearly labeled. Filled Date: 07 / 11 / 2023 Last Medication intake:  Today

## 2022-02-14 ENCOUNTER — Ambulatory Visit: Payer: Medicare Other

## 2022-02-14 DIAGNOSIS — M542 Cervicalgia: Secondary | ICD-10-CM

## 2022-02-14 DIAGNOSIS — R293 Abnormal posture: Secondary | ICD-10-CM

## 2022-02-14 DIAGNOSIS — N644 Mastodynia: Secondary | ICD-10-CM

## 2022-02-14 DIAGNOSIS — M6281 Muscle weakness (generalized): Secondary | ICD-10-CM

## 2022-02-14 NOTE — Therapy (Signed)
OUTPATIENT PHYSICAL THERAPY FEMALE PELVIC TREATMENT   Patient Name: Kimberly [name] MRN: [id number] DOB:09-22-1960, 61 y.o., female Today's Date: 02/14/2022   PT End of Session - 02/14/22 1126     Visit Number 3    Number of Visits 12    Date for PT Re-Evaluation 03/15/22    PT Start Time 1130    PT Stop Time 1210    PT Time Calculation (min) 40 min    Activity Tolerance Patient tolerated treatment well             Past Medical History:  Diagnosis Date   Anxiety    takes alprazolam - rare use    Arthritis    cervical spondylosis    Asthma    Balance problem    Brain cyst    Carpal tunnel syndrome, bilateral    Cervical fusion syndrome    Complete rupture of rotator cuff    Complication of anesthesia    lung collapsed after neck fusion   Degenerative disc disease, cervical    Depression    Difficult intubation    Small mouth opening, limited neck flexion, very anterior    Flu 08/10/2018   tested positive   GERD (gastroesophageal reflux disease)    pt. reports that its better, no meds in use at this time- 2015   Headache    Herpes    History of kidney stones    History of pneumonia    Hyperlipidemia    Hypertension    pt. doesn't see cardiologist, followed for HTN by Dr. Gerarda Fraction   Hypothyroidism    Inflammation of shoulder joint    Memory difficulties    Neuromuscular disorder ([place])    joint and muscle problems   Occipital neuralgia    Pneumothorax on right    following C4-6 ACDF 07/04/16   Pseudoarthrosis of cervical spine (HCC)    Recurrent falls    Syncope    Urinary frequency    Past Surgical History:  Procedure Laterality Date   ABDOMINAL HYSTERECTOMY     ANTERIOR CERVICAL DECOMP/DISCECTOMY FUSION N/A 08/06/2014   Procedure: ANTERIOR CERVICAL DECOMPRESSION/DISCECTOMY FUSION CERVICAL THREE-FOUR,CERVICAL SIX-SEVEN ,CERVICAL SEVEN-THORACIC ONE;  Surgeon: Floyce Stakes, MD;  Location: [place];  Service: Neurosurgery;  Laterality: N/A;   ANTERIOR  CERVICAL DECOMP/DISCECTOMY FUSION N/A 07/04/2016   Procedure: CERVICAL FOUR-FIVE, CERVICAL FIVE-SIX ANTERIOR CERVICAL DECOMPRESSION/DISCECTOMY/FUSION WITH REVISION OF CERVICAL THREE-FOUR PLATE;  Surgeon: Leeroy Cha, MD;  Location: [place];  Service: Neurosurgery;  Laterality: N/A;   CARPAL TUNNEL RELEASE Left 02/16/2013   Procedure: CARPAL TUNNEL RELEASE;  Surgeon: Carole Civil, MD;  Location: [facility];  Service: Orthopedics;  Laterality: Left;   CARPAL TUNNEL RELEASE Right 03/27/2013   Procedure: RIGHT CARPAL TUNNEL RELEASE;  Surgeon: Carole Civil, MD;  Location: [facility];  Service: Orthopedics;  Laterality: Right;   CESAREAN SECTION     x2   CHOLECYSTECTOMY N/A 06/17/2015   Procedure: LAPAROSCOPIC CHOLECYSTECTOMY;  Surgeon: Aviva Signs, MD;  Location: [facility];  Service: General;  Laterality: N/A;   COLONOSCOPY     COLONOSCOPY WITH PROPOFOL N/A 05/06/2019   Procedure: COLONOSCOPY WITH PROPOFOL;  Surgeon: Robert Bellow, MD;  Location: [facility];  Service: Endoscopy;  Laterality: N/A;   CYSTO WITH HYDRODISTENSION N/A 09/26/2021   Procedure: CYSTOSCOPY/HYDRODISTENSION;  Surgeon: Abbie Sons, MD;  Location: [facility];  Service: Urology;  Laterality: N/A;   CYSTOSCOPY W/ RETROGRADES Bilateral 09/26/2021   Procedure: CYSTOSCOPY WITH RETROGRADE PYELOGRAM;  Surgeon: Bernardo Heater,  Ronda Fairly, MD;  Location: [facility];  Service: Urology;  Laterality: Bilateral;   CYSTOSCOPY WITH BIOPSY N/A 09/26/2021   Procedure: CYSTOSCOPY WITH BIOPSY;  Surgeon: Abbie Sons, MD;  Location: [facility];  Service: Urology;  Laterality: N/A;   ESOPHAGOGASTRODUODENOSCOPY N/A 05/08/2021   Procedure: ESOPHAGOGASTRODUODENOSCOPY (EGD);  Surgeon: Lesly Rubenstein, MD;  Location[facility];  Service: Endoscopy;  Laterality: N/A;   ESOPHAGOGASTRODUODENOSCOPY  11/25/2017   EXCISION VAGINAL CYST N/A 12/22/2021   Procedure: EXCISION VULVAR CYST;  Surgeon: Benjaman Kindler, MD;  Location: [facility];  Service:  Gynecology;  Laterality: N/A;   EXTRACORPOREAL SHOCK WAVE LITHOTRIPSY Left 09/04/2018   Procedure: EXTRACORPOREAL SHOCK WAVE LITHOTRIPSY (ESWL);  Surgeon: Billey Co, MD;  Location: [facility];  Service: Urology;  Laterality: Left;   FOREIGN BODY REMOVAL Left    knee-as child   KNEE ARTHROSCOPY     NASAL SINUS SURGERY N/A 04/13/2015   Procedure: nasal endoscopy with adenoid biopsy;  Surgeon: Ruby Cola, MD;  Location: [place];  Service: ENT;  Laterality: N/A;   NECK SURGERY  2016   x 3 all together   POSTERIOR CERVICAL FUSION/FORAMINOTOMY N/A 11/13/2016   Procedure: CERVICAL TWO-CERVICAL SIX POSTERIOR CERVICAL FUSION WITH LATERAL MASS FIXATION;  Surgeon: Kristeen Miss, MD;  Location: [place];  Service: Neurosurgery;  Laterality: N/A;  posterior approach   REDUCTION MAMMAPLASTY  10/2019   ROTATOR CUFF REPAIR Left 2014   SHOULDER ACROMIOPLASTY Left 05/18/2015   Procedure: SHOULDER ACROMIOPLASTY;  Surgeon: Earlie Server, MD;  Location: [place];  Service: Orthopedics;  Laterality: Left;   SHOULDER ARTHROSCOPY WITH DISTAL CLAVICLE RESECTION Left 05/18/2015   Procedure: LEFT SHOULDER ARTHROSCOPY WITH  DISTAL CLAVICLE RESECTION;  Surgeon: Earlie Server, MD;  Location: [place];  Service: Orthopedics;  Laterality: Left;   URETEROSCOPY Bilateral 09/26/2021   Procedure: URETEROSCOPY;  Surgeon: Abbie Sons, MD;  Location: [facility];  Service: Urology;  Laterality: Bilateral;   XI ROBOTIC ASSISTED OOPHORECTOMY N/A 12/22/2021   Procedure: XI ROBOTIC ASSISTED DIAGNOSTIC LAPAROSCOPY, LEFT OOPHORECTOMY, WITH RIGHT SALPINGECTOMY AND LYSIS OF ADHESIONS;  Surgeon: Benjaman Kindler, MD;  Location: [facility];  Service: Gynecology;  Laterality: N/A;   Patient Active Problem List   Diagnosis Date Noted   Retroperitoneal lymphadenopathy 08/13/2021   Left lower quadrant abdominal pain 08/11/2021   Occipital headache 12/29/2019   Cervical myofascial pain syndrome 09/22/2019    Recurrent herpes simplex 07/16/2018   Pelvic pain in female 05/29/2018   Chronic pain syndrome 04/17/2018   Cervicalgia 04/17/2018   Controlled substance agreement signed 04/17/2018   Cervical fusion syndrome 03/24/2018   Hx of cervical spine surgery 03/24/2018   Depression, major, recurrent, mild ([place]) 11/21/2017   Poorly-controlled hypertension 11/21/2017   Depression, major, single episode, complete remission ([place]) 10/24/2017   Degenerative disc disease, cervical 08/28/2017   Vasovagal syncope 08/28/2017   Dizziness 08/22/2017   History of recent fall 08/22/2017   Hypothyroidism (acquired) 08/13/2017   Loss of memory 04/30/2017   Reaction to severe stress 02/14/2017   Pseudoarthrosis of cervical spine ([place]) 11/13/2016   Asthma 07/05/2016   Acute chest wall pain 07/05/2016   GERD (gastroesophageal reflux disease) 07/05/2016   Spontaneous pneumothorax    Medication overuse headache 04/19/2016   Chronic tension-type headache, not intractable 04/19/2016   Numbness and tingling 04/19/2016   Dysphagia 03/07/2016   Vitamin D deficiency 02/07/2016   Bilateral hand pain 02/06/2016   Cervical stenosis of spinal canal 08/06/2014   Muscle weakness (generalized)  11/12/2013   Tight fascia 11/12/2013   Decreased range of motion of shoulder 11/12/2013   Cervical spondylosis without myelopathy 10/29/2013   S/P carpal tunnel release 02/19/2013   CTS (carpal tunnel syndrome) 02/19/2013   SHOULDER PAIN 10/06/2007   IMPINGEMENT SYNDROME 10/06/2007   RUPTURE ROTATOR CUFF 10/06/2007   HTN (hypertension) 10/03/2007    PCP: Dion Body, MD   REFERRING PROVIDER: Vara Guardian, MD   REFERRING DIAG:  N64.4 (ICD-10-CM) - Mastodynia   THERAPY DIAG:  Neck pain, bilateral  Pain of both breasts  Abnormal posture  Muscle weakness (generalized)  Rationale for Evaluation and Treatment: Rehabilitation  ONSET DATE: Breast pain: since the surgery in 2021, Neck pain: for many years     SUBJECTIVE:                                                                                                                                                                                      PRECAUTIONS: None  WEIGHT BEARING RESTRICTIONS: No  FALLS:  Has patient fallen in last 6 months? No  OCCUPATION/SOCIAL ACTIVITIES: On disability, walking, reading, enjoying time with family    PLOF: Independent  PERTINENT HISTORY/CHART REVIEW: Siginificant history of cervical surgeries (decompressions/fusions) both anteriorly and posteriorly (2016-2018) Hx of L shoulder surgery and L acromioplasty Breast reduction surgery March 2021    CHIEF CONCERN: Pt is concerned with her severe pain and is unable to participate in many activities of daily living like cleaning, washing dishes, cooking without pain in the neck and B breasts. Pt is taking narcotics currently and gabapentin which makes her very tired and she feels she has to sleep off the medication which does not allow her to participate in many activities. Pt states her husband is also disabled and she has to take care of him. Pt reports having to lift/carry and pull oxygen tanks for her husband. Her neck pain is almost constant and she feels she is unable to move it the way she should. After the neck surgeries, Pt did attend physical therapy. Pt is able to drive and reports having no problems checking blind spots as she turns her whole body. The pain in the breast is located bilaterally but at times feels more in the L breast. The L breast pain is described as a sharp "shooting" pain and it goes from one breast to the other. When the breast pain is severe, she is unable to reach overhead into cabinets. Pt has no problems with grooming, showering, or reaching behind to put on bra. Pt does state the Dr telling her to get fitted properly for a supportive bra. Pt has noticed the breast pain is  diminished when she does not wear a bra at night.    Aggravating factors: reaching overhead Relieving factors: lying down     Neck pain: 9/10 (worst), 8/10 (best), constant, throbbing  AGG factors: moving her neck in different directions, cold, carrying heavy weighted objects (oxygen tanks)  Relieving factors: nothing, lying down/sleeping   LIVING ENVIRONMENT: Lives with: lives with their spouse Lives in: House/apartment   PATIENT GOALS: To relieve the pain or minimize the pain where I can do certain activities such as: reading, vacuuming, cleaning without limitation.    SUBJECTIVE:  Pt is feeling great. Pt has not had B breast pain since her last visit. Pt was able to try the scar massage with help of husband and it really helped at home.    PAIN:  Are you having pain? Yes NPRS scale: 4/10  Pain location: neck     OBJECTIVE:    COGNITION: Overall cognitive status: Within functional limits for tasks assessed     POSTURE:  In sitting and standing, B rounded shoulders   Lumbar lordosis: Deferred 2/2 time constraints   Thoracic kyphosis: Iliac crest height:  Lumbar lateral shift:  Pelvic obliquity:  Leg length discrepancy:    RANGE OF MOTION: ROM in degrees taken 02/07/22  (Norm range in degrees)  LEFT 01/31/22 RIGHT 01/31/22  Neck flexion:   35     Neck extension:  12    Neck lateral flexion:  10  5  Neck rotation:   15 10  Shoulder flexion   [facility]  Shoulder abduction   [facility]  Shoulder IR  [facility]  Shoulder ER  [facility] WFL  Elbow flexion   WFL WFL  Elbow extension   [facility] WFL  (*= pain, Blank rows = not tested)   STRENGTH: MMT  Finished on 02/07/22  RUE 01/31/22 LUE 01/31/22  Shoulder flexion  4 4  Shoulder abduction  4 4  Elbow flexion  5 5  Elbow extension  5 5  Neck flexion  5 5  Neck extension    Shoulder IR 5 4  Shoulder ER 5 4  (*= pain, Blank rows = not tested)     SPECIAL TESTS: 02/07/22 Sharp Purser Test- negative Spurling's Test- negative Cervical Distraction- positive, neck pain  decreased to 4/10    PALPATION: Abdominal:  Scar mobility: present under B breasts, scar extending from under arm to sternum  Painful upon superficial palpation    TODAY'S TREATMENT:    Manual Therapy: Scar mobilization at L breast scar for improved extensibility, mobility and pain modulation (heating pad placed around L side of neck) Increased tenderness with superficial mobilization but improved with increased time  Scar restriction further along scar right L breast towards areola    Neuromuscular Re-education: Seated cervical SNAGs (Sustained Natural Apophyseal Glides) B for pain modulation as pain limits spinal mobility, B x10  Seated upper trapezius stretch for pain modulation as pain limits spinal mobility, B x5 with 15 sec hold Pt limited in ROM   Seated levator scapulae technique for pain modulation and tissue extensibility, Bx5 Pt initially felt spasm at L anterior neck but with VCs for subtle motion, the tension improved    Patient response to interventions: Pt reports neck felt better after heat bringing pain at neck to 1/10 at end of session.   Patient Education:  Patient provided with HEP: upper trapezius and levator scapulae in sitting. Patient educated throughout session on appropriate technique and form using multi-modal cueing, HEP, and  activity modification. Patient will continue benefit from further education in order to maximize compliance and understanding for long-term therapeutic gains.  Patient Surveys:  [facility] - will assess next visit     ASSESSMENT:  Clinical Impression: Patient presents to clinic with excellent motivation to participate in today's session. Pt continues to demonstrate deficits in strength, ROM, posture, strength, pain, endurance, scar mobility. Pt with 4/10 neck pain at beginning of session which is improvement from previous session (7/10). Pt reports no breast pain within the last week and continuing with scar mobility massage as a  part of HEP. Pt required moderate VCs and TCs for proper technique during neck interventions for pain modulation and tissue extensibility. Pt benefited from diaphragmatic breathing for downregulation of nervous system. During scar tissue mobilization, Pt with significant scar restriction at the L breast towards the areola causing tenderness upon palpation and discomfort. With continued time of manual technique, discomfort eased. At end of session, Pt reports 1/10 neck pain. Pt responded well to all active, manual, and educational interventions. Patient will continue to benefit from skilled therapeutic intervention to address deficits in strength, ROM, posture, strength, pain, endurance, scar mobility in order to increase PLOF and improve overall QOL.    Objective Impairments: decreased activity tolerance, decreased endurance, decreased ROM, decreased strength, hypomobility, increased fascial restrictions, impaired sensation, improper body mechanics, postural dysfunction, and pain.   Activity Limitations: carrying, lifting, sitting, sleeping, dressing, reach over head, locomotion level, and caring for others  Personal Factors: Age, Behavior pattern, Past/current experiences, Time since onset of injury/illness/exacerbation, and 1-2 comorbidities: HTN, depression  are also affecting patient's functional outcome.   Rehab Potential: Good  Clinical Decision Making: Unstable/unpredictable  Evaluation Complexity: High   GOALS: Goals reviewed with patient? Yes  SHORT TERM GOALS: Target date: 02/23/22  Patient will demonstrate independence with HEP in order to maximize therapeutic gains and improve carryover from physical therapy sessions to ADLs in the home and community. Baseline: will give next visit, supine neck rotation/SNAGs in sitting last session  Goal status: INITIAL    LONG TERM GOALS: Target date: 03/15/2022  Patient will decrease worst pain as reported on NPRS by at least 2 points to  demonstrate clinically significant reduction in pain in order to restore/improve function and overall QOL. Baseline: 7/10 (breasts), 9/10 (neck) Goal status: INITIAL  2.  Patient will be able to demonstrate improved cervical ROM in all directions to allow for participation in activities such as, but not limited to: driving, sitting upright for long periods, reading, and caring for husband without limitations in order to signify overall improvement of chief concern.  Baseline: restricted in flex/ext/rot/lat flex Goal status: INITIAL  3.  Patient will demonstrate understanding of basic self-management/down-regulation of the nervous system for persistent pain condition and stress as evidenced by diaphragmatic breathing without cueing and body scan/progressive relaxation meditation, in order to transition to independent management of patient's chief complaint:breat pain and neck pain. Baseline: Will assess next visit, (02/14/22) when Pt feels pain/muscle pain Pt shrugs her shoulders and feels her muscles tense up Goal status: INITIAL  4.  Patient will be able to participate in ADLs such as, but not limited to: reaching over head, sitting for long periods of time, carrying/lifting objects without limitation due to pain to demonstrate improved UE muscular endurance and strength and participate fully in the home and in the community.  Baseline: has pain with above activities and has to lie down after due to pain Goal status: INITIAL  5.  Patient will be able to demonstrate improved postural alignment in standing and sitting and overall improved muscular strength in order to reduce pain and complete resolution of chief concern: breast and neck pain. Baseline: increased B rounded shoulders, increased thoracic kyphosis Goal status: INITIAL    PLAN: PT Frequency: 1-2x/week  PT Duration: 6 weeks  Planned Interventions: Therapeutic exercises, Therapeutic activity, Neuromuscular re-education, Balance  training, Gait training, Patient/Family education, Joint mobilization, Spinal mobilization, Cryotherapy, Moist heat, scar mobilization, Taping, and Manual therapy  Plan For Next Session: neck mobility/pain modulation/manual at neck, scar massage, FOTO   Ailanie Ruttan, PT, DPT  02/14/2022, 12:15 PM

## 2022-02-15 ENCOUNTER — Ambulatory Visit: Payer: Medicare Other

## 2022-02-15 ENCOUNTER — Ambulatory Visit: Payer: Self-pay | Admitting: Urology

## 2022-02-15 ENCOUNTER — Encounter: Payer: Medicare Other | Admitting: Student in an Organized Health Care Education/Training Program

## 2022-02-17 LAB — TOXASSURE SELECT 13 (MW), URINE

## 2022-02-20 ENCOUNTER — Encounter: Payer: Medicare Other | Admitting: Student in an Organized Health Care Education/Training Program

## 2022-02-20 ENCOUNTER — Ambulatory Visit: Payer: Medicare Other

## 2022-02-20 DIAGNOSIS — R293 Abnormal posture: Secondary | ICD-10-CM

## 2022-02-20 DIAGNOSIS — M542 Cervicalgia: Secondary | ICD-10-CM

## 2022-02-20 DIAGNOSIS — N644 Mastodynia: Secondary | ICD-10-CM

## 2022-02-20 DIAGNOSIS — M6281 Muscle weakness (generalized): Secondary | ICD-10-CM

## 2022-02-20 NOTE — Therapy (Signed)
OUTPATIENT PHYSICAL THERAPY FEMALE PELVIC TREATMENT   Patient Name: Kimberly [name] MRN: [id number] DOB:03-13-1961, 61 y.o., female Today's Date: 02/20/2022   PT End of Session - 02/20/22 1308     Visit Number 4    Number of Visits 12    Date for PT Re-Evaluation 03/15/22    PT Start Time 1315    PT Stop Time 4944    PT Time Calculation (min) 40 min    Activity Tolerance Patient tolerated treatment well             Past Medical History:  Diagnosis Date   Anxiety    takes alprazolam - rare use    Arthritis    cervical spondylosis    Asthma    Balance problem    Brain cyst    Carpal tunnel syndrome, bilateral    Cervical fusion syndrome    Complete rupture of rotator cuff    Complication of anesthesia    lung collapsed after neck fusion   Degenerative disc disease, cervical    Depression    Difficult intubation    Small mouth opening, limited neck flexion, very anterior    Flu 08/10/2018   tested positive   GERD (gastroesophageal reflux disease)    pt. reports that its better, no meds in use at this time- 2015   Headache    Herpes    History of kidney stones    History of pneumonia    Hyperlipidemia    Hypertension    pt. doesn't see cardiologist, followed for HTN by Dr. Gerarda Fraction   Hypothyroidism    Inflammation of shoulder joint    Memory difficulties    Neuromuscular disorder ([place])    joint and muscle problems   Occipital neuralgia    Pneumothorax on right    following C4-6 ACDF 07/04/16   Pseudoarthrosis of cervical spine (HCC)    Recurrent falls    Syncope    Urinary frequency    Past Surgical History:  Procedure Laterality Date   ABDOMINAL HYSTERECTOMY     ANTERIOR CERVICAL DECOMP/DISCECTOMY FUSION N/A 08/06/2014   Procedure: ANTERIOR CERVICAL DECOMPRESSION/DISCECTOMY FUSION CERVICAL THREE-FOUR,CERVICAL SIX-SEVEN ,CERVICAL SEVEN-THORACIC ONE;  Surgeon: Floyce Stakes, MD;  Location: [place];  Service: Neurosurgery;  Laterality: N/A;   ANTERIOR  CERVICAL DECOMP/DISCECTOMY FUSION N/A 07/04/2016   Procedure: CERVICAL FOUR-FIVE, CERVICAL FIVE-SIX ANTERIOR CERVICAL DECOMPRESSION/DISCECTOMY/FUSION WITH REVISION OF CERVICAL THREE-FOUR PLATE;  Surgeon: Leeroy Cha, MD;  Location: [place];  Service: Neurosurgery;  Laterality: N/A;   CARPAL TUNNEL RELEASE Left 02/16/2013   Procedure: CARPAL TUNNEL RELEASE;  Surgeon: Carole Civil, MD;  Location: [facility];  Service: Orthopedics;  Laterality: Left;   CARPAL TUNNEL RELEASE Right 03/27/2013   Procedure: RIGHT CARPAL TUNNEL RELEASE;  Surgeon: Carole Civil, MD;  Location: [facility];  Service: Orthopedics;  Laterality: Right;   CESAREAN SECTION     x2   CHOLECYSTECTOMY N/A 06/17/2015   Procedure: LAPAROSCOPIC CHOLECYSTECTOMY;  Surgeon: Aviva Signs, MD;  Location: [facility];  Service: General;  Laterality: N/A;   COLONOSCOPY     COLONOSCOPY WITH PROPOFOL N/A 05/06/2019   Procedure: COLONOSCOPY WITH PROPOFOL;  Surgeon: Robert Bellow, MD;  Location: [facility];  Service: Endoscopy;  Laterality: N/A;   CYSTO WITH HYDRODISTENSION N/A 09/26/2021   Procedure: CYSTOSCOPY/HYDRODISTENSION;  Surgeon: Abbie Sons, MD;  Location: [facility];  Service: Urology;  Laterality: N/A;   CYSTOSCOPY W/ RETROGRADES Bilateral 09/26/2021   Procedure: CYSTOSCOPY WITH RETROGRADE PYELOGRAM;  Surgeon: Bernardo Heater,  Ronda Fairly, MD;  Location: [facility];  Service: Urology;  Laterality: Bilateral;   CYSTOSCOPY WITH BIOPSY N/A 09/26/2021   Procedure: CYSTOSCOPY WITH BIOPSY;  Surgeon: Abbie Sons, MD;  Location: [facility];  Service: Urology;  Laterality: N/A;   ESOPHAGOGASTRODUODENOSCOPY N/A 05/08/2021   Procedure: ESOPHAGOGASTRODUODENOSCOPY (EGD);  Surgeon: Lesly Rubenstein, MD;  Location[facility];  Service: Endoscopy;  Laterality: N/A;   ESOPHAGOGASTRODUODENOSCOPY  11/25/2017   EXCISION VAGINAL CYST N/A 12/22/2021   Procedure: EXCISION VULVAR CYST;  Surgeon: Benjaman Kindler, MD;  Location: [facility];  Service:  Gynecology;  Laterality: N/A;   EXTRACORPOREAL SHOCK WAVE LITHOTRIPSY Left 09/04/2018   Procedure: EXTRACORPOREAL SHOCK WAVE LITHOTRIPSY (ESWL);  Surgeon: Billey Co, MD;  Location: [facility];  Service: Urology;  Laterality: Left;   FOREIGN BODY REMOVAL Left    knee-as child   KNEE ARTHROSCOPY     NASAL SINUS SURGERY N/A 04/13/2015   Procedure: nasal endoscopy with adenoid biopsy;  Surgeon: Ruby Cola, MD;  Location: [place];  Service: ENT;  Laterality: N/A;   NECK SURGERY  2016   x 3 all together   POSTERIOR CERVICAL FUSION/FORAMINOTOMY N/A 11/13/2016   Procedure: CERVICAL TWO-CERVICAL SIX POSTERIOR CERVICAL FUSION WITH LATERAL MASS FIXATION;  Surgeon: Kristeen Miss, MD;  Location: [place];  Service: Neurosurgery;  Laterality: N/A;  posterior approach   REDUCTION MAMMAPLASTY  10/2019   ROTATOR CUFF REPAIR Left 2014   SHOULDER ACROMIOPLASTY Left 05/18/2015   Procedure: SHOULDER ACROMIOPLASTY;  Surgeon: Earlie Server, MD;  Location: [place];  Service: Orthopedics;  Laterality: Left;   SHOULDER ARTHROSCOPY WITH DISTAL CLAVICLE RESECTION Left 05/18/2015   Procedure: LEFT SHOULDER ARTHROSCOPY WITH  DISTAL CLAVICLE RESECTION;  Surgeon: Earlie Server, MD;  Location: [place];  Service: Orthopedics;  Laterality: Left;   URETEROSCOPY Bilateral 09/26/2021   Procedure: URETEROSCOPY;  Surgeon: Abbie Sons, MD;  Location: [facility];  Service: Urology;  Laterality: Bilateral;   XI ROBOTIC ASSISTED OOPHORECTOMY N/A 12/22/2021   Procedure: XI ROBOTIC ASSISTED DIAGNOSTIC LAPAROSCOPY, LEFT OOPHORECTOMY, WITH RIGHT SALPINGECTOMY AND LYSIS OF ADHESIONS;  Surgeon: Benjaman Kindler, MD;  Location: [facility];  Service: Gynecology;  Laterality: N/A;   Patient Active Problem List   Diagnosis Date Noted   Retroperitoneal lymphadenopathy 08/13/2021   Left lower quadrant abdominal pain 08/11/2021   Occipital headache 12/29/2019   Cervical myofascial pain syndrome 09/22/2019    Recurrent herpes simplex 07/16/2018   Pelvic pain in female 05/29/2018   Chronic pain syndrome 04/17/2018   Cervicalgia 04/17/2018   Controlled substance agreement signed 04/17/2018   Cervical fusion syndrome 03/24/2018   Hx of cervical spine surgery 03/24/2018   Depression, major, recurrent, mild ([place]) 11/21/2017   Poorly-controlled hypertension 11/21/2017   Depression, major, single episode, complete remission ([place]) 10/24/2017   Degenerative disc disease, cervical 08/28/2017   Vasovagal syncope 08/28/2017   Dizziness 08/22/2017   History of recent fall 08/22/2017   Hypothyroidism (acquired) 08/13/2017   Loss of memory 04/30/2017   Reaction to severe stress 02/14/2017   Pseudoarthrosis of cervical spine ([place]) 11/13/2016   Asthma 07/05/2016   Acute chest wall pain 07/05/2016   GERD (gastroesophageal reflux disease) 07/05/2016   Spontaneous pneumothorax    Medication overuse headache 04/19/2016   Chronic tension-type headache, not intractable 04/19/2016   Numbness and tingling 04/19/2016   Dysphagia 03/07/2016   Vitamin D deficiency 02/07/2016   Bilateral hand pain 02/06/2016   Cervical stenosis of spinal canal 08/06/2014   Muscle weakness (generalized)  11/12/2013   Tight fascia 11/12/2013   Decreased range of motion of shoulder 11/12/2013   Cervical spondylosis without myelopathy 10/29/2013   S/P carpal tunnel release 02/19/2013   CTS (carpal tunnel syndrome) 02/19/2013   SHOULDER PAIN 10/06/2007   IMPINGEMENT SYNDROME 10/06/2007   RUPTURE ROTATOR CUFF 10/06/2007   HTN (hypertension) 10/03/2007    PCP: Dion Body, MD   REFERRING PROVIDER: Vara Guardian, MD   REFERRING DIAG:  N64.4 (ICD-10-CM) - Mastodynia   THERAPY DIAG:  Neck pain, bilateral  Pain of both breasts  Abnormal posture  Muscle weakness (generalized)  Rationale for Evaluation and Treatment: Rehabilitation  ONSET DATE: Breast pain: since the surgery in 2021, Neck pain: for many years     SUBJECTIVE:                                                                                                                                                                                      PRECAUTIONS: None  WEIGHT BEARING RESTRICTIONS: No  FALLS:  Has patient fallen in last 6 months? No  OCCUPATION/SOCIAL ACTIVITIES: On disability, walking, reading, enjoying time with family    PLOF: Independent  PERTINENT HISTORY/CHART REVIEW: Siginificant history of cervical surgeries (decompressions/fusions) both anteriorly and posteriorly (2016-2018) Hx of L shoulder surgery and L acromioplasty Breast reduction surgery March 2021    CHIEF CONCERN: Pt is concerned with her severe pain and is unable to participate in many activities of daily living like cleaning, washing dishes, cooking without pain in the neck and B breasts. Pt is taking narcotics currently and gabapentin which makes her very tired and she feels she has to sleep off the medication which does not allow her to participate in many activities. Pt states her husband is also disabled and she has to take care of him. Pt reports having to lift/carry and pull oxygen tanks for her husband. Her neck pain is almost constant and she feels she is unable to move it the way she should. After the neck surgeries, Pt did attend physical therapy. Pt is able to drive and reports having no problems checking blind spots as she turns her whole body. The pain in the breast is located bilaterally but at times feels more in the L breast. The L breast pain is described as a sharp "shooting" pain and it goes from one breast to the other. When the breast pain is severe, she is unable to reach overhead into cabinets. Pt has no problems with grooming, showering, or reaching behind to put on bra. Pt does state the Dr telling her to get fitted properly for a supportive bra. Pt has noticed the breast pain is  diminished when she does not wear a bra at night.    Aggravating factors: reaching overhead Relieving factors: lying down     Neck pain: 9/10 (worst), 8/10 (best), constant, throbbing  AGG factors: moving her neck in different directions, cold, carrying heavy weighted objects (oxygen tanks)  Relieving factors: nothing, lying down/sleeping   LIVING ENVIRONMENT: Lives with: lives with their spouse Lives in: House/apartment   PATIENT GOALS: To relieve the pain or minimize the pain where I can do certain activities such as: reading, vacuuming, cleaning without limitation.    SUBJECTIVE:  Pt is feeling better today in both the neck area and breasts. Pt has not had sharp pain in both breasts. Pt has not had severe neck pain since starting physical therapy.    PAIN:  Are you having pain? Yes NPRS scale: 3/10  Pain location: posterior neck     OBJECTIVE:    COGNITION: Overall cognitive status: Within functional limits for tasks assessed     POSTURE:  In sitting and standing, B rounded shoulders   Lumbar lordosis: Deferred 2/2 time constraints   Thoracic kyphosis: Iliac crest height:  Lumbar lateral shift:  Pelvic obliquity:  Leg length discrepancy:    RANGE OF MOTION: ROM in degrees taken 02/07/22  (Norm range in degrees)  LEFT 01/31/22 RIGHT 01/31/22  Neck flexion:   35     Neck extension:  12    Neck lateral flexion:  10  5  Neck rotation:   15 10  Shoulder flexion   [facility]  Shoulder abduction   [facility]  Shoulder IR  [facility]  Shoulder ER  [facility] WFL  Elbow flexion   [facility] WFL  Elbow extension   [facility] WFL  (*= pain, Blank rows = not tested)   STRENGTH: MMT  Finished on 02/07/22  RUE 01/31/22 LUE 01/31/22  Shoulder flexion  4 4  Shoulder abduction  4 4  Elbow flexion  5 5  Elbow extension  5 5  Neck flexion  5 5  Neck extension    Shoulder IR 5 4  Shoulder ER 5 4  (*= pain, Blank rows = not tested)     SPECIAL TESTS: 02/07/22 Sharp Purser Test- negative Spurling's Test- negative Cervical Distraction-  positive, neck pain decreased to 4/10    PALPATION: Abdominal:  Scar mobility: present under B breasts, scar extending from under arm to sternum  Painful upon superficial palpation    TODAY'S TREATMENT:    Manual Therapy: STM at suboccipitals, L and R levator scapulae and upper trap  Pt with significant muscular tension at cervical paraspinals which eased with increased time  Pt with significant tension at L levator scapulae compared to R (where Pt has anterior cervical surgery)   Neuromuscular Re-education: Neck isometric in 4 directions at wall with ball (flexion, extension, left and right lateral flexion)for improved strengthening of neck musculature   Neck isometric in seated in all 4 direction as part of HEP for improved strengthening of neck musculature   Patient response to interventions: Pt reports she feels neck is better able to move after manual techniques. Pt ended session with 1/10.   Patient Education:  Patient provided with HEP: neck isometrics in 4 directions seated. Patient educated throughout session on appropriate technique and form using multi-modal cueing, HEP, and activity modification. Patient will continue benefit from further education in order to maximize compliance and understanding for long-term therapeutic gains.  Patient Surveys:  FOTO Neck - 51 (54 is target  score)    ASSESSMENT:  Clinical Impression: Patient presents to clinic with excellent motivation to participate in today's session. Pt continues to demonstrate deficits in strength, ROM, posture, strength, pain, endurance, scar mobility. Pt with 3/10 neck pain at beginning of session with no occurrences of severe/stabbing breast pain the last few weeks. Upon palpation at the posterior neck, Pt with significant tension at subocciptals, along cervical paraspinals, and B levator scapulae (more L> R). Pt with discomfort initially with STM but improved with increased time and guided pursed-lip  breathing. Pt required moderate VCs and TCs for proper technique during active neck interventions to begin cervical strengthening. Pt demonstrates moderate limitation/disability/distress on FOTO Neck (51). At end of session, Pt reports 1/10 neck pain. Pt responded well to all active, manual, and educational interventions and will continue to benefit from skilled therapeutic intervention to address deficits in strength, ROM, posture, strength, pain, endurance, scar mobility in order to increase PLOF and improve overall QOL.    Objective Impairments: decreased activity tolerance, decreased endurance, decreased ROM, decreased strength, hypomobility, increased fascial restrictions, impaired sensation, improper body mechanics, postural dysfunction, and pain.   Activity Limitations: carrying, lifting, sitting, sleeping, dressing, reach over head, locomotion level, and caring for others  Personal Factors: Age, Behavior pattern, Past/current experiences, Time since onset of injury/illness/exacerbation, and 1-2 comorbidities: HTN, depression  are also affecting patient's functional outcome.   Rehab Potential: Good  Clinical Decision Making: Unstable/unpredictable  Evaluation Complexity: High   GOALS: Goals reviewed with patient? Yes  SHORT TERM GOALS: Target date: 02/23/22  Patient will demonstrate independence with HEP in order to maximize therapeutic gains and improve carryover from physical therapy sessions to ADLs in the home and community. Baseline: will give next visit, supine neck rotation/SNAGs in sitting last session, (7/25): neck strength isometric Goal status: IN PROGRESS    LONG TERM GOALS: Target date: 03/15/2022  Patient will decrease worst pain as reported on NPRS by at least 2 points to demonstrate clinically significant reduction in pain in order to restore/improve function and overall QOL. Baseline: 7/10 (breasts), 9/10 (neck) Goal status: INITIAL  2.  Patient will be able to  demonstrate improved cervical ROM in all directions to allow for participation in activities such as, but not limited to: driving, sitting upright for long periods, reading, and caring for husband without limitations in order to signify overall improvement of chief concern.  Baseline: restricted in flex/ext/rot/lat flex Goal status: INITIAL  3.  Patient will demonstrate understanding of basic self-management/down-regulation of the nervous system for persistent pain condition and stress as evidenced by diaphragmatic breathing without cueing and body scan/progressive relaxation meditation, in order to transition to independent management of patient's chief complaint:breat pain and neck pain. Baseline: Will assess next visit, (02/14/22) when Pt feels pain/muscle pain Pt shrugs her shoulders and feels her muscles tense up Goal status: INITIAL  4.  Patient will be able to participate in ADLs such as, but not limited to: reaching over head, sitting for long periods of time, carrying/lifting objects without limitation due to pain to demonstrate improved UE muscular endurance and strength and participate fully in the home and in the community.  Baseline: has pain with above activities and has to lie down after due to pain Goal status: INITIAL  5.  Patient will be able to demonstrate improved postural alignment in standing and sitting and overall improved muscular strength in order to reduce pain and complete resolution of chief concern: breast and neck pain. Baseline: increased B rounded shoulders,  increased thoracic kyphosis Goal status: INITIAL    PLAN: PT Frequency: 1-2x/week  PT Duration: 6 weeks  Planned Interventions: Therapeutic exercises, Therapeutic activity, Neuromuscular re-education, Balance training, Gait training, Patient/Family education, Joint mobilization, Spinal mobilization, Cryotherapy, Moist heat, scar mobilization, Taping, and Manual therapy  Plan For Next Session: scar mob at  breasts, continue neck strength, go over neck stretches   Sofi Bryars, PT, DPT  02/20/2022, 1:09 PM

## 2022-02-22 ENCOUNTER — Ambulatory Visit: Payer: Medicare Other

## 2022-02-28 ENCOUNTER — Ambulatory Visit: Payer: Medicare Other | Attending: Plastic Surgery

## 2022-02-28 DIAGNOSIS — M542 Cervicalgia: Secondary | ICD-10-CM | POA: Insufficient documentation

## 2022-02-28 DIAGNOSIS — R293 Abnormal posture: Secondary | ICD-10-CM | POA: Diagnosis present

## 2022-02-28 DIAGNOSIS — M6281 Muscle weakness (generalized): Secondary | ICD-10-CM | POA: Diagnosis present

## 2022-02-28 DIAGNOSIS — N644 Mastodynia: Secondary | ICD-10-CM | POA: Insufficient documentation

## 2022-02-28 NOTE — Therapy (Signed)
OUTPATIENT PHYSICAL THERAPY FEMALE PELVIC TREATMENT   Patient Name: Kimberly [name] MRN: [id number] DOB:[birth year], 61 y.o., female Today's Date: 02/28/2022   PT End of Session - 02/28/22 1359     Visit Number 5    Number of Visits 12    Date for PT Re-Evaluation 03/15/22    PT Start Time 1400    PT Stop Time 1440    PT Time Calculation (min) 40 min    Activity Tolerance Patient tolerated treatment well             Past Medical History:  Diagnosis Date   Anxiety    takes alprazolam - rare use    Arthritis    cervical spondylosis    Asthma    Balance problem    Brain cyst    Carpal tunnel syndrome, bilateral    Cervical fusion syndrome    Complete rupture of rotator cuff    Complication of anesthesia    lung collapsed after neck fusion   Degenerative disc disease, cervical    Depression    Difficult intubation    Small mouth opening, limited neck flexion, very anterior    Flu 08/10/2018   tested positive   GERD (gastroesophageal reflux disease)    pt. reports that its better, no meds in use at this time- 2015   Headache    Herpes    History of kidney stones    History of pneumonia    Hyperlipidemia    Hypertension    pt. doesn't see cardiologist, followed for HTN by Dr. Gerarda Fraction   Hypothyroidism    Inflammation of shoulder joint    Memory difficulties    Neuromuscular disorder ([place])    joint and muscle problems   Occipital neuralgia    Pneumothorax on right    following C4-6 ACDF 07/04/16   Pseudoarthrosis of cervical spine (HCC)    Recurrent falls    Syncope    Urinary frequency    Past Surgical History:  Procedure Laterality Date   ABDOMINAL HYSTERECTOMY     ANTERIOR CERVICAL DECOMP/DISCECTOMY FUSION N/A 08/06/2014   Procedure: ANTERIOR CERVICAL DECOMPRESSION/DISCECTOMY FUSION CERVICAL THREE-FOUR,CERVICAL SIX-SEVEN ,CERVICAL SEVEN-THORACIC ONE;  Surgeon: Floyce Stakes, MD;  Location: [place];  Service: Neurosurgery;  Laterality: N/A;   ANTERIOR  CERVICAL DECOMP/DISCECTOMY FUSION N/A 07/04/2016   Procedure: CERVICAL FOUR-FIVE, CERVICAL FIVE-SIX ANTERIOR CERVICAL DECOMPRESSION/DISCECTOMY/FUSION WITH REVISION OF CERVICAL THREE-FOUR PLATE;  Surgeon: Leeroy Cha, MD;  Location: [place];  Service: Neurosurgery;  Laterality: N/A;   CARPAL TUNNEL RELEASE Left 02/16/2013   Procedure: CARPAL TUNNEL RELEASE;  Surgeon: Carole Civil, MD;  Location: [facility];  Service: Orthopedics;  Laterality: Left;   CARPAL TUNNEL RELEASE Right 03/27/2013   Procedure: RIGHT CARPAL TUNNEL RELEASE;  Surgeon: Carole Civil, MD;  Location: [facility];  Service: Orthopedics;  Laterality: Right;   CESAREAN SECTION     x2   CHOLECYSTECTOMY N/A 06/17/2015   Procedure: LAPAROSCOPIC CHOLECYSTECTOMY;  Surgeon: Aviva Signs, MD;  Location: [facility];  Service: General;  Laterality: N/A;   COLONOSCOPY     COLONOSCOPY WITH PROPOFOL N/A 05/06/2019   Procedure: COLONOSCOPY WITH PROPOFOL;  Surgeon: Robert Bellow, MD;  Location: [facility];  Service: Endoscopy;  Laterality: N/A;   CYSTO WITH HYDRODISTENSION N/A 09/26/2021   Procedure: CYSTOSCOPY/HYDRODISTENSION;  Surgeon: Abbie Sons, MD;  Location: [facility];  Service: Urology;  Laterality: N/A;   CYSTOSCOPY W/ RETROGRADES Bilateral 09/26/2021   Procedure: CYSTOSCOPY WITH RETROGRADE PYELOGRAM;  Surgeon: Bernardo Heater,  Ronda Fairly, MD;  Location: [facility];  Service: Urology;  Laterality: Bilateral;   CYSTOSCOPY WITH BIOPSY N/A 09/26/2021   Procedure: CYSTOSCOPY WITH BIOPSY;  Surgeon: Abbie Sons, MD;  Location: [facility];  Service: Urology;  Laterality: N/A;   ESOPHAGOGASTRODUODENOSCOPY N/A 05/08/2021   Procedure: ESOPHAGOGASTRODUODENOSCOPY (EGD);  Surgeon: Lesly Rubenstein, MD;  Location: [facility];  Service: Endoscopy;  Laterality: N/A;   ESOPHAGOGASTRODUODENOSCOPY  11/25/2017   EXCISION VAGINAL CYST N/A 12/22/2021   Procedure: EXCISION VULVAR CYST;  Surgeon: Benjaman Kindler, MD;  Location: [facility];  Service:  Gynecology;  Laterality: N/A;   EXTRACORPOREAL SHOCK WAVE LITHOTRIPSY Left 09/04/2018   Procedure: EXTRACORPOREAL SHOCK WAVE LITHOTRIPSY (ESWL);  Surgeon: Billey Co, MD;  Location: [facility];  Service: Urology;  Laterality: Left;   FOREIGN BODY REMOVAL Left    knee-as child   KNEE ARTHROSCOPY     NASAL SINUS SURGERY N/A 04/13/2015   Procedure: nasal endoscopy with adenoid biopsy;  Surgeon: Ruby Cola, MD;  Location: [place];  Service: ENT;  Laterality: N/A;   NECK SURGERY  2016   x 3 all together   POSTERIOR CERVICAL FUSION/FORAMINOTOMY N/A 11/13/2016   Procedure: CERVICAL TWO-CERVICAL SIX POSTERIOR CERVICAL FUSION WITH LATERAL MASS FIXATION;  Surgeon: Kristeen Miss, MD;  Location: [place];  Service: Neurosurgery;  Laterality: N/A;  posterior approach   REDUCTION MAMMAPLASTY  10/2019   ROTATOR CUFF REPAIR Left 2014   SHOULDER ACROMIOPLASTY Left 05/18/2015   Procedure: SHOULDER ACROMIOPLASTY;  Surgeon: Earlie Server, MD;  Location: [place];  Service: Orthopedics;  Laterality: Left;   SHOULDER ARTHROSCOPY WITH DISTAL CLAVICLE RESECTION Left 05/18/2015   Procedure: LEFT SHOULDER ARTHROSCOPY WITH  DISTAL CLAVICLE RESECTION;  Surgeon: Earlie Server, MD;  Location: [place];  Service: Orthopedics;  Laterality: Left;   URETEROSCOPY Bilateral 09/26/2021   Procedure: URETEROSCOPY;  Surgeon: Abbie Sons, MD;  Location: [facility];  Service: Urology;  Laterality: Bilateral;   XI ROBOTIC ASSISTED OOPHORECTOMY N/A 12/22/2021   Procedure: XI ROBOTIC ASSISTED DIAGNOSTIC LAPAROSCOPY, LEFT OOPHORECTOMY, WITH RIGHT SALPINGECTOMY AND LYSIS OF ADHESIONS;  Surgeon: Benjaman Kindler, MD;  Location: [facility];  Service: Gynecology;  Laterality: N/A;   Patient Active Problem List   Diagnosis Date Noted   Retroperitoneal lymphadenopathy 08/13/2021   Left lower quadrant abdominal pain 08/11/2021   Occipital headache 12/29/2019   Cervical myofascial pain syndrome 09/22/2019    Recurrent herpes simplex 07/16/2018   Pelvic pain in female 05/29/2018   Chronic pain syndrome 04/17/2018   Cervicalgia 04/17/2018   Controlled substance agreement signed 04/17/2018   Cervical fusion syndrome 03/24/2018   Hx of cervical spine surgery 03/24/2018   Depression, major, recurrent, mild ([place]) 11/21/2017   Poorly-controlled hypertension 11/21/2017   Depression, major, single episode, complete remission ([place]) 10/24/2017   Degenerative disc disease, cervical 08/28/2017   Vasovagal syncope 08/28/2017   Dizziness 08/22/2017   History of recent fall 08/22/2017   Hypothyroidism (acquired) 08/13/2017   Loss of memory 04/30/2017   Reaction to severe stress 02/14/2017   Pseudoarthrosis of cervical spine ([place]) 11/13/2016   Asthma 07/05/2016   Acute chest wall pain 07/05/2016   GERD (gastroesophageal reflux disease) 07/05/2016   Spontaneous pneumothorax    Medication overuse headache 04/19/2016   Chronic tension-type headache, not intractable 04/19/2016   Numbness and tingling 04/19/2016   Dysphagia 03/07/2016   Vitamin D deficiency 02/07/2016   Bilateral hand pain 02/06/2016   Cervical stenosis of spinal canal 08/06/2014   Muscle weakness (generalized)  11/12/2013   Tight fascia 11/12/2013   Decreased range of motion of shoulder 11/12/2013   Cervical spondylosis without myelopathy 10/29/2013   S/P carpal tunnel release 02/19/2013   CTS (carpal tunnel syndrome) 02/19/2013   SHOULDER PAIN 10/06/2007   IMPINGEMENT SYNDROME 10/06/2007   RUPTURE ROTATOR CUFF 10/06/2007   HTN (hypertension) 10/03/2007    PCP: Dion Body, MD   REFERRING PROVIDER: Vara Guardian, MD   REFERRING DIAG:  N64.4 (ICD-10-CM) - Mastodynia   THERAPY DIAG:  Neck pain, bilateral  Pain of both breasts  Abnormal posture  Muscle weakness (generalized)  Rationale for Evaluation and Treatment: Rehabilitation  ONSET DATE: Breast pain: since the surgery in 2021, Neck pain: for many years     SUBJECTIVE:                                                                                                                                                                                      PRECAUTIONS: None  WEIGHT BEARING RESTRICTIONS: No  FALLS:  Has patient fallen in last 6 months? No  OCCUPATION/SOCIAL ACTIVITIES: On disability, walking, reading, enjoying time with family    PLOF: Independent  PERTINENT HISTORY/CHART REVIEW: Siginificant history of cervical surgeries (decompressions/fusions) both anteriorly and posteriorly (2016-2018) Hx of L shoulder surgery and L acromioplasty Breast reduction surgery March 2021    CHIEF CONCERN: Pt is concerned with her severe pain and is unable to participate in many activities of daily living like cleaning, washing dishes, cooking without pain in the neck and B breasts. Pt is taking narcotics currently and gabapentin which makes her very tired and she feels she has to sleep off the medication which does not allow her to participate in many activities. Pt states her husband is also disabled and she has to take care of him. Pt reports having to lift/carry and pull oxygen tanks for her husband. Her neck pain is almost constant and she feels she is unable to move it the way she should. After the neck surgeries, Pt did attend physical therapy. Pt is able to drive and reports having no problems checking blind spots as she turns her whole body. The pain in the breast is located bilaterally but at times feels more in the L breast. The L breast pain is described as a sharp "shooting" pain and it goes from one breast to the other. When the breast pain is severe, she is unable to reach overhead into cabinets. Pt has no problems with grooming, showering, or reaching behind to put on bra. Pt does state the Dr telling her to get fitted properly for a supportive bra. Pt has noticed the breast pain is  diminished when she does not wear a bra at night.    Aggravating factors: reaching overhead Relieving factors: lying down     Neck pain: 9/10 (worst), 8/10 (best), constant, throbbing  AGG factors: moving her neck in different directions, cold, carrying heavy weighted objects (oxygen tanks)  Relieving factors: nothing, lying down/sleeping   LIVING ENVIRONMENT: Lives with: lives with their spouse Lives in: House/apartment   PATIENT GOALS: To relieve the pain or minimize the pain where I can do certain activities such as: reading, vacuuming, cleaning without limitation.    SUBJECTIVE:  Pt is not feeling great and having external stressors since she is moving to a new house. Pt is feeling more pain and knows it is probably because of stress. When Pt feels the intense pain in the neck she takes an oxycodone and goes to lie down. She hasn't really tried the neck stretches for the pain.    PAIN:  Are you having pain? Yes NPRS scale: 7/10  Pain location: posterior neck     OBJECTIVE:    COGNITION: Overall cognitive status: Within functional limits for tasks assessed     POSTURE:  In sitting and standing, B rounded shoulders   Lumbar lordosis: Deferred 2/2 time constraints   Thoracic kyphosis: Iliac crest height:  Lumbar lateral shift:  Pelvic obliquity:  Leg length discrepancy:    RANGE OF MOTION: ROM in degrees taken 02/07/22  (Norm range in degrees)  LEFT 01/31/22 RIGHT 01/31/22  Neck flexion:   35     Neck extension:  12    Neck lateral flexion:  10  5  Neck rotation:   15 10  Shoulder flexion   [facility]  Shoulder abduction   [facility]  Shoulder IR  [facility]  Shoulder ER  [facility] WFL  Elbow flexion   WFL WFL  Elbow extension   [facility] WFL  (*= pain, Blank rows = not tested)   STRENGTH: MMT  Finished on 02/07/22  RUE 01/31/22 LUE 01/31/22  Shoulder flexion  4 4  Shoulder abduction  4 4  Elbow flexion  5 5  Elbow extension  5 5  Neck flexion  5 5  Neck extension    Shoulder IR 5 4  Shoulder ER 5 4  (*= pain, Blank  rows = not tested)     SPECIAL TESTS: 02/07/22 Sharp Purser Test- negative Spurling's Test- negative Cervical Distraction- positive, neck pain decreased to 4/10    PALPATION: Abdominal:  Scar mobility: present under B breasts, scar extending from under arm to sternum  Painful upon superficial palpation    TODAY'S TREATMENT:    Manual Therapy: STM at suboccipitals, L and R levator scapulae and upper trap  Pt with significant muscular tension at cervical paraspinals which eased with increased time  Pt with significant tension at B levator scapulae which eased with increased time  Suboccipital release technique   Neuromuscular Re-education: Supine cervical ROM into rotation for pain modulation as pain limits spinal mobility, Bx10   Seated upper trapezius stretch for pain modulation as pain limits spinal mobility, B x5 with 15 sec hold, guided with diaphragmatic breathing Pt noted to have improved ROM as Pt able to use hand to guide stretch  Seated levator scapulae technique for pain modulation and tissue extensibility, Bx3 with 15 sec hold, guided with diaphragmatic breathing Pt reports no spasm of neck musculature on the L side compared to first time a couple sessions ago  Review of neck ROM in seated (  all 4 directions) for pain modulation as pain limits spinal mobility   Discussion on using diaphragmatic breathing to downregulate nervous system as well as pain modulation techniques practiced above.   Patient response to interventions: Pt reports she feels neck is better able to move after manual techniques. Pt ended session with 2/10.   Patient Education:  No change in HEP. Pt to continue with neck stretches and isometric strengthening. Patient educated throughout session on appropriate technique and form using multi-modal cueing, HEP, and activity modification. Patient will continue benefit from further education in order to maximize compliance and understanding for  long-term therapeutic gains.     ASSESSMENT:  Clinical Impression: Patient presents to clinic with excellent motivation to participate in today's session. Pt continues to demonstrate deficits in strength, ROM, posture, strength, pain, endurance, scar mobility. Pt with increased posterior neck pain 7/10 (NPRS) described as sharp/throbbing. Pt with significant tension at subocciptals and upon palpation described as a sharp pain. After manual techniques and suboccipital release, posterior neck pain and tension improved significantly. At end of session Pt reports 2/10 pain. During active interventions, DPT observed significant improvement in ROM especially with upper trap/levator scapulae stretches as Pt unable to use hand to guide into gentle stretch a few weeks ago. Pt responded positively to all active, manual, and educational interventions and will continue to benefit from skilled therapeutic intervention to address deficits in strength, ROM, posture, strength, pain, endurance, scar mobility in order to increase PLOF and improve overall QOL.    Objective Impairments: decreased activity tolerance, decreased endurance, decreased ROM, decreased strength, hypomobility, increased fascial restrictions, impaired sensation, improper body mechanics, postural dysfunction, and pain.   Activity Limitations: carrying, lifting, sitting, sleeping, dressing, reach over head, locomotion level, and caring for others  Personal Factors: Age, Behavior pattern, Past/current experiences, Time since onset of injury/illness/exacerbation, and 1-2 comorbidities: HTN, depression  are also affecting patient's functional outcome.   Rehab Potential: Good  Clinical Decision Making: Unstable/unpredictable  Evaluation Complexity: High   GOALS: Goals reviewed with patient? Yes  SHORT TERM GOALS: Target date: 02/23/22  Patient will demonstrate independence with HEP in order to maximize therapeutic gains and improve  carryover from physical therapy sessions to ADLs in the home and community. Baseline: will give next visit, supine neck rotation/SNAGs in sitting last session, (7/25): neck strength isometric Goal status: IN PROGRESS    LONG TERM GOALS: Target date: 03/15/2022  Patient will decrease worst pain as reported on NPRS by at least 2 points to demonstrate clinically significant reduction in pain in order to restore/improve function and overall QOL. Baseline: 7/10 (breasts), 9/10 (neck) Goal status: INITIAL  2.  Patient will be able to demonstrate improved cervical ROM in all directions to allow for participation in activities such as, but not limited to: driving, sitting upright for long periods, reading, and caring for husband without limitations in order to signify overall improvement of chief concern.  Baseline: restricted in flex/ext/rot/lat flex Goal status: INITIAL  3.  Patient will demonstrate understanding of basic self-management/down-regulation of the nervous system for persistent pain condition and stress as evidenced by diaphragmatic breathing without cueing and body scan/progressive relaxation meditation, in order to transition to independent management of patient's chief complaint:breat pain and neck pain. Baseline: Will assess next visit, (02/14/22) when Pt feels pain/muscle pain Pt shrugs her shoulders and feels her muscles tense up Goal status: INITIAL  4.  Patient will be able to participate in ADLs such as, but not limited to: reaching over head,  sitting for long periods of time, carrying/lifting objects without limitation due to pain to demonstrate improved UE muscular endurance and strength and participate fully in the home and in the community.  Baseline: has pain with above activities and has to lie down after due to pain Goal status: INITIAL  5.  Patient will be able to demonstrate improved postural alignment in standing and sitting and overall improved muscular strength in  order to reduce pain and complete resolution of chief concern: breast and neck pain. Baseline: increased B rounded shoulders, increased thoracic kyphosis Goal status: INITIAL    PLAN: PT Frequency: 1-2x/week  PT Duration: 6 weeks  Planned Interventions: Therapeutic exercises, Therapeutic activity, Neuromuscular re-education, Balance training, Gait training, Patient/Family education, Joint mobilization, Spinal mobilization, Cryotherapy, Moist heat, scar mobilization, Taping, and Manual therapy  Plan For Next Session: scar mobs, neck strengthening with band?, ROM?  Turkessa Ostrom, PT, DPT  02/28/2022, 2:01 PM

## 2022-03-01 ENCOUNTER — Ambulatory Visit: Payer: Medicare Other

## 2022-03-05 ENCOUNTER — Ambulatory Visit: Payer: Medicare Other

## 2022-03-07 ENCOUNTER — Ambulatory Visit: Payer: Medicare Other

## 2022-03-12 ENCOUNTER — Ambulatory Visit: Payer: Medicare Other

## 2022-03-12 ENCOUNTER — Other Ambulatory Visit: Payer: Self-pay | Admitting: Urology

## 2022-03-12 DIAGNOSIS — R300 Dysuria: Secondary | ICD-10-CM

## 2022-03-14 ENCOUNTER — Ambulatory Visit: Payer: Medicare Other

## 2022-03-15 ENCOUNTER — Ambulatory Visit: Payer: Medicare Other

## 2022-03-19 ENCOUNTER — Telehealth: Payer: Self-pay

## 2022-03-19 ENCOUNTER — Ambulatory Visit: Payer: Medicare Other

## 2022-03-21 ENCOUNTER — Ambulatory Visit: Payer: Medicare Other

## 2022-03-21 DIAGNOSIS — M542 Cervicalgia: Secondary | ICD-10-CM

## 2022-03-21 DIAGNOSIS — R293 Abnormal posture: Secondary | ICD-10-CM

## 2022-03-21 DIAGNOSIS — M6281 Muscle weakness (generalized): Secondary | ICD-10-CM

## 2022-03-21 DIAGNOSIS — N644 Mastodynia: Secondary | ICD-10-CM

## 2022-03-21 NOTE — Therapy (Signed)
OUTPATIENT PHYSICAL THERAPY TREATMENT/RE-CERT  FROM PERIOD OF 01/31/22 - 03/21/22   Patient Name: Kimberly [name] MRN: [id number] DOB:1960-12-04, 61 y.o., female Today's Date: 03/21/2022   PT End of Session - 03/21/22 1358     Visit Number 6    Number of Visits 18    Date for PT Re-Evaluation 05/02/22    Authorization Type IE: 01/31/22; RC: 03/21/22    PT Start Time 1400    PT Stop Time 1440    PT Time Calculation (min) 40 min    Activity Tolerance Patient tolerated treatment well             Past Medical History:  Diagnosis Date   Anxiety    takes alprazolam - rare use    Arthritis    cervical spondylosis    Asthma    Balance problem    Brain cyst    Carpal tunnel syndrome, bilateral    Cervical fusion syndrome    Complete rupture of rotator cuff    Complication of anesthesia    lung collapsed after neck fusion   Degenerative disc disease, cervical    Depression    Difficult intubation    Small mouth opening, limited neck flexion, very anterior    Flu 08/10/2018   tested positive   GERD (gastroesophageal reflux disease)    pt. reports that its better, no meds in use at this time- 2015   Headache    Herpes    History of kidney stones    History of pneumonia    Hyperlipidemia    Hypertension    pt. doesn't see cardiologist, followed for HTN by Dr. Gerarda Fraction   Hypothyroidism    Inflammation of shoulder joint    Memory difficulties    Neuromuscular disorder ([place])    joint and muscle problems   Occipital neuralgia    Pneumothorax on right    following C4-6 ACDF 07/04/16   Pseudoarthrosis of cervical spine (HCC)    Recurrent falls    Syncope    Urinary frequency    Past Surgical History:  Procedure Laterality Date   ABDOMINAL HYSTERECTOMY     ANTERIOR CERVICAL DECOMP/DISCECTOMY FUSION N/A 08/06/2014   Procedure: ANTERIOR CERVICAL DECOMPRESSION/DISCECTOMY FUSION CERVICAL THREE-FOUR,CERVICAL SIX-SEVEN ,CERVICAL SEVEN-THORACIC ONE;  Surgeon: Floyce Stakes, MD;   Location: [facility];  Service: Neurosurgery;  Laterality: N/A;   ANTERIOR CERVICAL DECOMP/DISCECTOMY FUSION N/A 07/04/2016   Procedure: CERVICAL FOUR-FIVE, CERVICAL FIVE-SIX ANTERIOR CERVICAL DECOMPRESSION/DISCECTOMY/FUSION WITH REVISION OF CERVICAL THREE-FOUR PLATE;  Surgeon: Leeroy Cha, MD;  Location: [facility];  Service: Neurosurgery;  Laterality: N/A;   CARPAL TUNNEL RELEASE Left 02/16/2013   Procedure: CARPAL TUNNEL RELEASE;  Surgeon: Carole Civil, MD;  Location: [facility];  Service: Orthopedics;  Laterality: Left;   CARPAL TUNNEL RELEASE Right 03/27/2013   Procedure: RIGHT CARPAL TUNNEL RELEASE;  Surgeon: Carole Civil, MD;  Location: [facility];  Service: Orthopedics;  Laterality: Right;   CESAREAN SECTION     x2   CHOLECYSTECTOMY N/A 06/17/2015   Procedure: LAPAROSCOPIC CHOLECYSTECTOMY;  Surgeon: Aviva Signs, MD;  Location: [facility];  Service: General;  Laterality: N/A;   COLONOSCOPY     COLONOSCOPY WITH PROPOFOL N/A 05/06/2019   Procedure: COLONOSCOPY WITH PROPOFOL;  Surgeon: Robert Bellow, MD;  Location: [facility];  Service: Endoscopy;  Laterality: N/A;   CYSTO WITH HYDRODISTENSION N/A 09/26/2021   Procedure: CYSTOSCOPY/HYDRODISTENSION;  Surgeon: Abbie Sons, MD;  Location: [facility];  Service: Urology;  Laterality: N/A;   CYSTOSCOPY  W/ RETROGRADES Bilateral 09/26/2021   Procedure: CYSTOSCOPY WITH RETROGRADE PYELOGRAM;  Surgeon: Abbie Sons, MD;  Location: [facility];  Service: Urology;  Laterality: Bilateral;   CYSTOSCOPY WITH BIOPSY N/A 09/26/2021   Procedure: CYSTOSCOPY WITH BIOPSY;  Surgeon: Abbie Sons, MD;  Location: [facility];  Service: Urology;  Laterality: N/A;   ESOPHAGOGASTRODUODENOSCOPY N/A 05/08/2021   Procedure: ESOPHAGOGASTRODUODENOSCOPY (EGD);  Surgeon: Lesly Rubenstein, MD;  Location: [facility];  Service: Endoscopy;  Laterality: N/A;   ESOPHAGOGASTRODUODENOSCOPY  11/25/2017   EXCISION VAGINAL CYST N/A 12/22/2021   Procedure: EXCISION  VULVAR CYST;  Surgeon: Benjaman Kindler, MD;  Location: [facility];  Service: Gynecology;  Laterality: N/A;   EXTRACORPOREAL SHOCK WAVE LITHOTRIPSY Left 09/04/2018   Procedure: EXTRACORPOREAL SHOCK WAVE LITHOTRIPSY (ESWL);  Surgeon: Billey Co, MD;  Location: [facility];  Service: Urology;  Laterality: Left;   FOREIGN BODY REMOVAL Left    knee-as child   KNEE ARTHROSCOPY     NASAL SINUS SURGERY N/A 04/13/2015   Procedure: nasal endoscopy with adenoid biopsy;  Surgeon: Ruby Cola, MD;  Location: [facility];  Service: ENT;  Laterality: N/A;   NECK SURGERY  2016   x 3 all together   POSTERIOR CERVICAL FUSION/FORAMINOTOMY N/A 11/13/2016   Procedure: CERVICAL TWO-CERVICAL SIX POSTERIOR CERVICAL FUSION WITH LATERAL MASS FIXATION;  Surgeon: Kristeen Miss, MD;  Location: [facility];  Service: Neurosurgery;  Laterality: N/A;  posterior approach   REDUCTION MAMMAPLASTY  10/2019   ROTATOR CUFF REPAIR Left 2014   SHOULDER ACROMIOPLASTY Left 05/18/2015   Procedure: SHOULDER ACROMIOPLASTY;  Surgeon: Earlie Server, MD;  Location: [address];  Service: Orthopedics;  Laterality: Left;   SHOULDER ARTHROSCOPY WITH DISTAL CLAVICLE RESECTION Left 05/18/2015   Procedure: LEFT SHOULDER ARTHROSCOPY WITH  DISTAL CLAVICLE RESECTION;  Surgeon: Earlie Server, MD;  Location: [facility];  Service: Orthopedics;  Laterality: Left;   URETEROSCOPY Bilateral 09/26/2021   Procedure: URETEROSCOPY;  Surgeon: Abbie Sons, MD;  Location: [facility];  Service: Urology;  Laterality: Bilateral;   XI ROBOTIC ASSISTED OOPHORECTOMY N/A 12/22/2021   Procedure: XI ROBOTIC ASSISTED DIAGNOSTIC LAPAROSCOPY, LEFT OOPHORECTOMY, WITH RIGHT SALPINGECTOMY AND LYSIS OF ADHESIONS;  Surgeon: Benjaman Kindler, MD;  Location: [facility];  Service: Gynecology;  Laterality: N/A;   Patient Active Problem List   Diagnosis Date Noted   Retroperitoneal lymphadenopathy 08/13/2021   Left lower quadrant abdominal pain 08/11/2021    Occipital headache 12/29/2019   Cervical myofascial pain syndrome 09/22/2019   Recurrent herpes simplex 07/16/2018   Pelvic pain in female 05/29/2018   Chronic pain syndrome 04/17/2018   Cervicalgia 04/17/2018   Controlled substance agreement signed 04/17/2018   Cervical fusion syndrome 03/24/2018   Hx of cervical spine surgery 03/24/2018   Depression, major, recurrent, mild ([place]) 11/21/2017   Poorly-controlled hypertension 11/21/2017   Depression, major, single episode, complete remission ([place]) 10/24/2017   Degenerative disc disease, cervical 08/28/2017   Vasovagal syncope 08/28/2017   Dizziness 08/22/2017   History of recent fall 08/22/2017   Hypothyroidism (acquired) 08/13/2017   Loss of memory 04/30/2017   Reaction to severe stress 02/14/2017   Pseudoarthrosis of cervical spine ([place]) 11/13/2016   Asthma 07/05/2016   Acute chest wall pain 07/05/2016   GERD (gastroesophageal reflux disease) 07/05/2016   Spontaneous pneumothorax    Medication overuse headache 04/19/2016   Chronic tension-type headache, not intractable 04/19/2016   Numbness and tingling 04/19/2016   Dysphagia 03/07/2016   Vitamin D deficiency 02/07/2016   Bilateral hand pain  02/06/2016   Cervical stenosis of spinal canal 08/06/2014   Muscle weakness (generalized) 11/12/2013   Tight fascia 11/12/2013   Decreased range of motion of shoulder 11/12/2013   Cervical spondylosis without myelopathy 10/29/2013   S/P carpal tunnel release 02/19/2013   CTS (carpal tunnel syndrome) 02/19/2013   SHOULDER PAIN 10/06/2007   IMPINGEMENT SYNDROME 10/06/2007   RUPTURE ROTATOR CUFF 10/06/2007   HTN (hypertension) 10/03/2007    PCP: Dion Body, MD   REFERRING PROVIDER: Vara Guardian, MD   REFERRING DIAG:  N64.4 (ICD-10-CM) - Mastodynia   THERAPY DIAG:  Neck pain, bilateral  Pain of both breasts  Abnormal posture  Muscle weakness (generalized)  Rationale for Evaluation and Treatment:  Rehabilitation  ONSET DATE: Breast pain: since the surgery in 2021, Neck pain: for many years                                                                                                                                                                                PRECAUTIONS: None  WEIGHT BEARING RESTRICTIONS: No  FALLS:  Has patient fallen in last 6 months? No  OCCUPATION/SOCIAL ACTIVITIES: On disability, walking, reading, enjoying time with family    PLOF: Independent  PERTINENT HISTORY/CHART REVIEW: Siginificant history of cervical surgeries (decompressions/fusions) both anteriorly and posteriorly (2016-2018) Hx of L shoulder surgery and L acromioplasty Breast reduction surgery March 2021    CHIEF CONCERN: Pt is concerned with her severe pain and is unable to participate in many activities of daily living like cleaning, washing dishes, cooking without pain in the neck and B breasts. Pt is taking narcotics currently and gabapentin which makes her very tired and she feels she has to sleep off the medication which does not allow her to participate in many activities. Pt states her husband is also disabled and she has to take care of him. Pt reports having to lift/carry and pull oxygen tanks for her husband. Her neck pain is almost constant and she feels she is unable to move it the way she should. After the neck surgeries, Pt did attend physical therapy. Pt is able to drive and reports having no problems checking blind spots as she turns her whole body. The pain in the breast is located bilaterally but at times feels more in the L breast. The L breast pain is described as a sharp "shooting" pain and it goes from one breast to the other. When the breast pain is severe, she is unable to reach overhead into cabinets. Pt has no problems with grooming, showering, or reaching behind to put on bra. Pt does state the Dr telling her to get fitted properly for a supportive bra. Pt has noticed  the  breast pain is diminished when she does not wear a bra at night.   Aggravating factors: reaching overhead Relieving factors: lying down     Neck pain: 9/10 (worst), 8/10 (best), constant, throbbing  AGG factors: moving her neck in different directions, cold, carrying heavy weighted objects (oxygen tanks)  Relieving factors: nothing, lying down/sleeping   LIVING ENVIRONMENT: Lives with: lives with their spouse Lives in: House/apartment   PATIENT GOALS: To relieve the pain or minimize the pain where I can do certain activities such as: reading, vacuuming, cleaning without limitation.    SUBJECTIVE:  Pt has been in and out of the hospital with husband. Pt is also having a lot of external stressors which she feels exacerbates the pain.    PAIN:  Are you having pain? Yes NPRS scale: 7/10  Pain location: L sided neck pain    OBJECTIVE:    COGNITION: Overall cognitive status: Within functional limits for tasks assessed       RANGE OF MOTION: ROM in degrees taken 02/07/22    (Norm range in degrees)  LEFT 01/31/22 RIGHT 01/31/22  03/21/22 LEFT 03/21/22 RIGHT 03/21/22  Neck flexion:   35    55    Neck extension:  12   22    Neck lateral flexion:  '10  5  23 25  '$ Neck rotation:   15 10  45 45  Shoulder flexion   [facility]     Shoulder abduction   [facility]     Shoulder IR  [facility]     Shoulder ER  [facility] WFL     Elbow flexion   [facility] WFL     Elbow extension   [facility] WFL     (*= pain, Blank rows = not tested)   STRENGTH: MMT  Finished on 02/07/22  RUE 01/31/22 LUE 01/31/22  Shoulder flexion  4 4  Shoulder abduction  4 4  Elbow flexion  5 5  Elbow extension  5 5  Neck flexion  5 5  Neck extension    Shoulder IR 5 4  Shoulder ER 5 4  (*= pain, Blank rows = not tested)     SPECIAL TESTS: 02/07/22 Sharp Purser Test- negative Spurling's Test- negative Cervical Distraction- positive, neck pain decreased to 4/10    PALPATION: Abdominal:  Scar mobility: present under B breasts,  scar extending from under arm to sternum  Painful upon superficial palpation    TODAY'S TREATMENT:    Neuromuscular Re-education: Reassessment of LTGs and STGs below and FOTO FOTO Neck - 49 (-2); however asked about bending down to clean shower which did not ask the first time and Pt answered: Quite a bit of difficulty  Neck ROM measurements above   Discussion on the parasympathetic vs sympathetic nervous system and how external stressors can exacerbate pain and send increased signals to the brain. Pt reports using diaphragmatic breathing when pain begins to increase or is exacerbated with increased UE activity.    Patient response to interventions: Pt is pleased with her ROM and how she has no more constant breast pain    Patient Education:  No change in HEP. Pt to continue with neck stretches and isometric strengthening. Patient educated throughout session on appropriate technique and form using multi-modal cueing, HEP, and activity modification. Patient will continue benefit from further education in order to maximize compliance and understanding for long-term therapeutic gains.     ASSESSMENT:  Clinical Impression: Patient presents to clinic with excellent  motivation to participate in today's session. Pt continues to demonstrate deficits in ROM, posture, strength, pain, endurance, scar mobility. Upon reassessment of LTGs and STGs, Pt demonstrates significant improvement from IE (01/31/22). Pt has improved with worst pain of B breast being a 4/10 (IE: 7/10) and of the neck 8/10 (IE: 9/10). Pt has learned to use diaphragmatic breathing to help the downregulation of the nervous system. Although neck pain has improved with reaching overhead/sitting for longer periods of time/reading (no pain), Pt continues to have pain with grooming hair/carrying objects and lifting. Pt has improved most significantly in neck ROM: neck flex 55 degrees (IE: 35), ext 22 (12), lateral flex R 25 and L 23 (5 and  10), and rotation R/L 45 (R- 10, L-15). Pt responded positively to all progression in goals but will continue to benefit from skilled therapeutic intervention to address deficits in ROM, posture, strength, pain, and endurance in order to increase PLOF and improve overall QOL.    Objective Impairments: decreased activity tolerance, decreased endurance, decreased ROM, decreased strength, hypomobility, increased fascial restrictions, impaired sensation, improper body mechanics, postural dysfunction, and pain.   Activity Limitations: carrying, lifting, sitting, sleeping, dressing, reach over head, locomotion level, and caring for others  Personal Factors: Age, Behavior pattern, Past/current experiences, Time since onset of injury/illness/exacerbation, and 1-2 comorbidities: HTN, depression  are also affecting patient's functional outcome.   Rehab Potential: Good  Clinical Decision Making: Unstable/unpredictable  Evaluation Complexity: High   GOALS: Goals reviewed with patient? Yes  SHORT TERM GOALS: Target date: 04/11/22  Patient will demonstrate independence with HEP in order to maximize therapeutic gains and improve carryover from physical therapy sessions to ADLs in the home and community. Baseline: will give next visit, supine neck rotation/SNAGs in sitting last session, (7/25): neck strength isometric; (8/23): uses sheets and continues to perform every other day Goal status: IN PROGRESS    LONG TERM GOALS: Target date: 05/02/2022  Patient will decrease worst pain as reported on NPRS by at least 2 points to demonstrate clinically significant reduction in pain in order to restore/improve function and overall QOL. Baseline: 7/10 (breasts), 9/10 (neck); (8/23): breast 4/10, neck 8/10  Goal status: IN PROGRESS  2.  Patient will be able to demonstrate improved cervical ROM in all directions to allow for participation in activities such as, but not limited to: driving, sitting upright for  long periods, reading, and caring for husband without limitations in order to signify overall improvement of chief concern.  Baseline: restricted in flex/ext/rot/lat flex; (8/23):  Goal status: IN PROGRESS  3.  Patient will demonstrate understanding of basic self-management/down-regulation of the nervous system for persistent pain condition and stress as evidenced by diaphragmatic breathing without cueing and body scan/progressive relaxation meditation, in order to transition to independent management of patient's chief complaint:breat pain and neck pain. Baseline: Will assess next visit, (02/14/22) when Pt feels pain/muscle pain Pt shrugs her shoulders and feels her muscles tense up; (8/23): Pt continues to practice diaphragmatic breathing Goal status: IN PROGRESS  4.  Patient will be able to participate in ADLs such as, but not limited to: reaching over head, sitting for long periods of time, carrying/lifting objects without limitation due to pain to demonstrate improved UE muscular endurance and strength and participate fully in the home and in the community.  Baseline: has pain with above activities and has to lie down after due to pain; (8/23): carrying objects/lifting still creates neck pain, grooming Goal status: IN PROGRESS  5.  Patient will  be able to demonstrate improved postural alignment in standing and sitting and overall improved muscular strength in order to reduce pain and complete resolution of chief concern: breast and neck pain. Baseline: increased B rounded shoulders, increased thoracic kyphosis; (8/23): improved B shoulder in sitting  Goal status: IN PROGRESS    PLAN: PT Frequency: 1x/week  PT Duration: 6 weeks  Planned Interventions: Therapeutic exercises, Therapeutic activity, Neuromuscular re-education, Balance training, Gait training, Patient/Family education, Joint mobilization, Spinal mobilization, Cryotherapy, Moist heat, scar mobilization, Taping, and Manual  therapy  Plan For Next Session: scar mobs, neck strengthening with band?, ROM?  Adaysha Dubinsky, PT, DPT  03/21/2022, 2:05 PM

## 2022-03-26 ENCOUNTER — Ambulatory Visit: Payer: Medicare Other

## 2022-03-28 ENCOUNTER — Ambulatory Visit: Payer: Medicare Other

## 2022-03-30 ENCOUNTER — Ambulatory Visit: Payer: Medicare Other

## 2022-04-04 ENCOUNTER — Ambulatory Visit: Payer: Medicare Other

## 2022-04-09 ENCOUNTER — Other Ambulatory Visit: Payer: Self-pay | Admitting: Urology

## 2022-04-09 DIAGNOSIS — R300 Dysuria: Secondary | ICD-10-CM

## 2022-04-10 ENCOUNTER — Ambulatory Visit
Admission: RE | Admit: 2022-04-10 | Discharge: 2022-04-10 | Disposition: A | Discharge status: Home / Self Care | Payer: Medicare Other | Source: Ambulatory Visit | Attending: Family Medicine | Admitting: Family Medicine

## 2022-04-10 ENCOUNTER — Other Ambulatory Visit: Payer: Self-pay | Admitting: Family Medicine

## 2022-04-10 DIAGNOSIS — R1032 Left lower quadrant pain: Secondary | ICD-10-CM | POA: Insufficient documentation

## 2022-04-10 MED ORDER — IOHEXOL 300 MG/ML  SOLN
100.0000 mL | Freq: Once | INTRAMUSCULAR | Status: AC | PRN
  Administered 2022-04-10: 100 mL via INTRAVENOUS

## 2022-04-13 ENCOUNTER — Ambulatory Visit: Payer: Medicare Other

## 2022-04-18 ENCOUNTER — Ambulatory Visit: Payer: Medicare Other

## 2022-04-25 ENCOUNTER — Ambulatory Visit: Payer: Medicare Other

## 2022-04-27 ENCOUNTER — Ambulatory Visit: Payer: Medicare Other | Admitting: Urology

## 2022-05-01 ENCOUNTER — Ambulatory Visit: Payer: Medicare Other

## 2022-05-02 ENCOUNTER — Ambulatory Visit: Payer: Medicare Other

## 2022-05-04 ENCOUNTER — Telehealth: Payer: Self-pay

## 2022-05-04 NOTE — Therapy (Signed)
[facility] [address] [place], [place], [address] Phone: [phone]   Fax:  [phone]  May 04, 2022   No Recipients  Physical Therapy Discharge Summary  Patient: Kimberly [name]  MRN: [id number]  Date of Birth: 09-24-1960   Diagnosis: No diagnosis found. No data recorded  The above patient had been seen in Physical Therapy 6 times of 12 treatments scheduled with 0 no shows and 11 cancellations.  The treatment consisted of scar mobilization, pain modulation, improving neck ROM and strength.  The patient is: Improved  Subjective:  IE 01/31/22: Pt is concerned with her severe pain and is unable to participate in many activities of daily living like cleaning, washing dishes, cooking without pain in the neck and B breasts. Pt is taking narcotics currently and gabapentin which makes her very tired and she feels she has to sleep off the medication which does not allow her to participate in many activities. Pt states her husband is also disabled and she has to take care of him. Pt reports having to lift/carry and pull oxygen tanks for her husband. Her neck pain is almost constant and she feels she is unable to move it the way she should. After the neck surgeries, Pt did attend physical therapy. Pt is able to drive and reports having no problems checking blind spots as she turns her whole body. The pain in the breast is located bilaterally but at times feels more in the L breast. The L breast pain is described as a sharp "shooting" pain and it goes from one breast to the other. When the breast pain is severe, she is unable to reach overhead into cabinets. Pt has no problems with grooming, showering, or reaching behind to put on bra. Pt does state the Dr telling her to get fitted properly for a supportive bra. Pt has noticed the breast pain is diminished when she does not wear a bra at night.   Last session 03/21/22: Pt has been in and  out of the hospital with husband. Pt is also having a lot of external stressors which she feels exacerbates the pain. Pt with no increase in breast pain and has been performing scar mobilization.   Discharge Findings: Self-discharged  Functional Status at Discharge: Improved   No Goals Met   SHORT TERM GOALS: Target date: 04/11/22  Patient will demonstrate independence with HEP in order to maximize therapeutic gains and improve carryover from physical therapy sessions to ADLs in the home and community. Baseline: will give next visit, supine neck rotation/SNAGs in sitting last session, (7/25): neck strength isometric; (8/23): uses sheets and continues to perform every other day Goal status: IN PROGRESS    LONG TERM GOALS: Target date: 05/02/2022  Patient will decrease worst pain as reported on NPRS by at least 2 points to demonstrate clinically significant reduction in pain in order to restore/improve function and overall QOL. Baseline: 7/10 (breasts), 9/10 (neck); (8/23): breast 4/10, neck 8/10  Goal status: IN PROGRESS  2.  Patient will be able to demonstrate improved cervical ROM in all directions to allow for participation in activities such as, but not limited to: driving, sitting upright for long periods, reading, and caring for husband without limitations in order to signify overall improvement of chief concern.  Baseline: restricted in flex/ext/rot/lat flex; (8/23):  Goal status: IN PROGRESS  3.  Patient will demonstrate understanding of basic self-management/down-regulation of the nervous system for persistent pain condition and stress  as evidenced by diaphragmatic breathing without cueing and body scan/progressive relaxation meditation, in order to transition to independent management of patient's chief complaint:breat pain and neck pain. Baseline: Will assess next visit, (02/14/22) when Pt feels pain/muscle pain Pt shrugs her shoulders and feels her muscles tense up; (8/23): Pt  continues to practice diaphragmatic breathing Goal status: IN PROGRESS  4.  Patient will be able to participate in ADLs such as, but not limited to: reaching over head, sitting for long periods of time, carrying/lifting objects without limitation due to pain to demonstrate improved UE muscular endurance and strength and participate fully in the home and in the community.  Baseline: has pain with above activities and has to lie down after due to pain; (8/23): carrying objects/lifting still creates neck pain, grooming Goal status: IN PROGRESS  5.  Patient will be able to demonstrate improved postural alignment in standing and sitting and overall improved muscular strength in order to reduce pain and complete resolution of chief concern: breast and neck pain. Baseline: increased B rounded shoulders, increased thoracic kyphosis; (8/23): improved B shoulder in sitting  Goal status: IN PROGRESS    Sincerely,  Filbert Berthold, PT, DPT    CC No Recipients  [facility] [address] [place], [place], [address] Phone: [phone]   Fax:  [phone]  Patient: Kimberly [name]  MRN: [id number]  Date of Birth: 1961/06/09

## 2022-05-04 NOTE — Telephone Encounter (Signed)
DPT called Pt and asked about appt at [facility] according to [place]. Pt meets with breast Dr and until further notice will like to cancel remaining appts. DPT encouraged to continue with scar mobilization and stretches practiced in therapy.

## 2022-05-15 ENCOUNTER — Encounter: Payer: Self-pay | Admitting: Student in an Organized Health Care Education/Training Program

## 2022-05-15 ENCOUNTER — Ambulatory Visit
Payer: Medicare Other | Attending: Student in an Organized Health Care Education/Training Program | Admitting: Student in an Organized Health Care Education/Training Program

## 2022-05-15 VITALS — BP 100/84 | HR 116 | Temp 97.0°F | Resp 16 | Ht 61.0 in | Wt 146.0 lb

## 2022-05-15 DIAGNOSIS — M7918 Myalgia, other site: Secondary | ICD-10-CM | POA: Insufficient documentation

## 2022-05-15 DIAGNOSIS — M5481 Occipital neuralgia: Secondary | ICD-10-CM | POA: Diagnosis present

## 2022-05-15 DIAGNOSIS — M47812 Spondylosis without myelopathy or radiculopathy, cervical region: Secondary | ICD-10-CM | POA: Insufficient documentation

## 2022-05-15 DIAGNOSIS — Q761 Klippel-Feil syndrome: Secondary | ICD-10-CM | POA: Diagnosis present

## 2022-05-15 DIAGNOSIS — G894 Chronic pain syndrome: Secondary | ICD-10-CM | POA: Diagnosis present

## 2022-05-15 DIAGNOSIS — M5030 Other cervical disc degeneration, unspecified cervical region: Secondary | ICD-10-CM | POA: Diagnosis present

## 2022-05-15 DIAGNOSIS — Z9889 Other specified postprocedural states: Secondary | ICD-10-CM | POA: Insufficient documentation

## 2022-05-15 MED ORDER — GABAPENTIN 600 MG PO TABS: 600.0000 mg | ORAL_TABLET | Freq: Three times a day (TID) | ORAL | 5 refills | Status: DC

## 2022-05-15 MED ORDER — OXYCODONE HCL 10 MG PO TABS: 10.0000 mg | ORAL_TABLET | Freq: Three times a day (TID) | ORAL | 0 refills | Status: DC | PRN

## 2022-05-15 MED ORDER — OXYCODONE HCL 10 MG PO TABS: 10.0000 mg | ORAL_TABLET | Freq: Three times a day (TID) | ORAL | 0 refills | Status: AC | PRN

## 2022-05-15 NOTE — Progress Notes (Signed)
Nursing Pain Medication Assessment:  Safety precautions to be maintained throughout the outpatient stay will include: orient to surroundings, keep bed in low position, maintain call bell within reach at all times, provide assistance with transfer out of bed and ambulation.  Medication Inspection Compliance: Pill count conducted under aseptic conditions, in front of the patient. Neither the pills nor the bottle was removed from the patient's sight at any time. Once count was completed pills were immediately returned to the patient in their original bottle.  Medication: Oxycodone IR Pill/Patch Count:  61 of 90 pills remain Pill/Patch Appearance: Markings consistent with prescribed medication Bottle Appearance: Standard pharmacy container. Clearly labeled. Filled Date: 10 / 09 / 2023 Last Medication intake:  Today

## 2022-05-15 NOTE — Progress Notes (Signed)
PROVIDER NOTE: Information contained herein reflects review and annotations entered in association with encounter. Interpretation of such information and data should be left to medically-trained personnel. Information provided to patient can be located elsewhere in the medical record under "Patient Instructions". Document created using STT-dictation technology, any transcriptional errors that may result from process are unintentional.    Patient: Kimberly [name]  Service Category: E/M  Provider: Gillis Santa, MD  DOB: 28-Apr-1961  DOS: 05/15/2022  Specialty: Interventional Pain Management  MRN: [id number]  Setting: Ambulatory outpatient  PCP: Kimberly Body, MD  Type: Established Patient    Referring Provider: Dion Body, MD  Location: Office  Delivery: Face-to-face     HPI  [name], a 61 y.o. year old female, is here today because of her Cervical spondylosis without myelopathy [M47.812]. Kimberly [name] primary complain today is Neck Pain (Center of head to neck)  Last encounter: My last encounter with her was on 02/13/22  Pertinent problems: Kimberly [name] has SHOULDER PAIN; IMPINGEMENT SYNDROME; RUPTURE ROTATOR CUFF; CTS (carpal tunnel syndrome); Cervical spondylosis without myelopathy; Cervical stenosis of spinal canal; Pseudoarthrosis of cervical spine ([place]); Numbness and tingling; Vitamin D deficiency; Cervical fusion syndrome; Hx of cervical spine surgery; Chronic pain syndrome; Cervicalgia; Controlled substance agreement signed; Depression, major, single episode, complete remission ([place]); Cervical myofascial pain syndrome; and Occipital headache on their pertinent problem list. Pain Assessment: Severity of Chronic pain is reported as a 7 /10. Location: Neck  /radiates from mid head to neck. Onset: More than a month ago. Quality: Sharp, Throbbing. Timing: Intermittent. Modifying factor(s): heat, med. Vitals:  height is '5\' 1"'  (1.549 m) and weight is 146 lb (66.2 kg). Her  temperature is 97 F (36.1 C) (abnormal). Her blood pressure is 100/84 and her pulse is 116 (abnormal). Her respiration is 16 and oxygen saturation is 98%.   Reason for encounter: medication management.    No change in medical history since last visit.  Patient's pain is at baseline.  Patient continues multimodal pain regimen as prescribed.  States that it provides pain relief and improvement in functional status. Refill of gabapentin and oxycodone as below.   She recently moved into a new home that her and her husband built.  She is very excited about her new place.   Pharmacotherapy Assessment  Analgesic: Oxycodone 10 mg every 8 hours as needed, quantity 90/month; MME equals 45    Monitoring: [place] PMP: PDMP reviewed during this encounter.       Pharmacotherapy: No side-effects or adverse reactions reported. Compliance: No problems identified. Effectiveness: Clinically acceptable.  UDS:  Summary  Date Value Ref Range Status  02/13/2022 Note  Final    Comment:    ==================================================================== [employer] [facility] 13 (MW) ==================================================================== Test                             Result       Flag       Units  Drug Present and Declared for Prescription Verification   Oxycodone                      3605         EXPECTED   ng/mg creat   Oxymorphone                    3670         EXPECTED   ng/mg creat   Noroxycodone  2769         EXPECTED   ng/mg creat   Noroxymorphone                 1088         EXPECTED   ng/mg creat    Sources of oxycodone are scheduled prescription medications.    Oxymorphone, noroxycodone, and noroxymorphone are expected    metabolites of oxycodone. Oxymorphone is also available as a    scheduled prescription medication.  Drug Absent but Declared for Prescription Verification   Diazepam                       Not Detected UNEXPECTED ng/mg  creat ==================================================================== Test                      Result    Flag   Units      Ref Range   Creatinine              119              mg/dL      >=20 ==================================================================== Declared Medications:  The flagging and interpretation on this report are based on the  following declared medications.  Unexpected results may arise from  inaccuracies in the declared medications.   **Note: The testing scope of this panel includes these medications:   Diazepam  Oxycodone   **Note: The testing scope of this panel does not include the  following reported medications:   Acetaminophen (Tylenol)  Amitriptyline (Elavil)  Amlodipine (Norvasc)  Estradiol (Estrace)  Eye Drop  Gabapentin (Neurontin)  Hydroxyzine (Atarax)  Hyoscyamine (Uribel)  Meloxicam (Mobic)  Methenamine (Uribel)  Methylene Blue (Uribel)  Pantoprazole (Protonix)  Phenyl salicylate (Uribel)  Rizatriptan (Maxalt)  Sodium phosphate, monobasic (Uribel)  Valacyclovir (Valtrex)  Vitamin D3 ==================================================================== For clinical consultation, please call [phone]. ====================================================================       ROS  Constitutional: Denies any fever or chills +headaches, occipital dominant Gastrointestinal: No reported hemesis, hematochezia, vomiting, or acute GI distress Musculoskeletal: Denies any acute onset joint swelling, redness, loss of ROM, or weakness Neurological: No reported episodes of acute onset apraxia, aphasia, dysarthria, agnosia, amnesia, paralysis, loss of coordination, or loss of consciousness  Medication Review  NONFORMULARY OR COMPOUNDED ITEM, Oxycodone HCl, Tetrahydrozoline HCl, Uribel, acetaminophen, amLODipine, amitriptyline, cholecalciferol, estradiol, gabapentin, hydrOXYzine, meloxicam, pantoprazole, rizatriptan, and  valACYclovir  History Review  Allergy: Kimberly [name] is allergic to shellfish allergy, peanut-containing drug products, codeine, diphtheria toxoid, and tetanus toxoids. Drug: Kimberly [name]  reports no history of drug use. Alcohol:  reports current alcohol use. Tobacco:  reports that she has never smoked. She has never used smokeless tobacco. Social: Kimberly [name]  reports that she has never smoked. She has never used smokeless tobacco. She reports current alcohol use. She reports that she does not use drugs. Medical:  has a past medical history of Anxiety, Arthritis, Asthma, Balance problem, Brain cyst, Carpal tunnel syndrome, bilateral, Cervical fusion syndrome, Complete rupture of rotator cuff, Complication of anesthesia, Degenerative disc disease, cervical, Depression, Difficult intubation, Flu (08/10/2018), GERD (gastroesophageal reflux disease), Headache, Herpes, History of kidney stones, History of pneumonia, Hyperlipidemia, Hypertension, Hypothyroidism, Inflammation of shoulder joint, Memory difficulties, Neuromuscular disorder ([place]), Occipital neuralgia, Pneumothorax on right, Pseudoarthrosis of cervical spine ([place]), Recurrent falls, Syncope, and Urinary frequency. Surgical: Kimberly [name]  has a past surgical history that includes Abdominal hysterectomy; Rotator cuff repair (Left, 2014); Foreign [name] Removal (Left); Carpal tunnel release (Left,  02/16/2013); Carpal tunnel release (Right, 03/27/2013); Cesarean section; Anterior cervical decomp/discectomy fusion (N/A, 08/06/2014); Colonoscopy; Nasal sinus surgery (N/A, 04/13/2015); Shoulder arthroscopy with distal clavicle resection (Left, 05/18/2015); Shoulder acromioplasty (Left, 05/18/2015); Cholecystectomy (N/A, 06/17/2015); Neck surgery (2016); Anterior cervical decomp/discectomy fusion (N/A, 07/04/2016); Posterior cervical fusion/foraminotomy (N/A, 11/13/2016); Knee arthroscopy; Extracorporeal shock wave lithotripsy (Left, 09/04/2018); Colonoscopy with  propofol (N/A, 05/06/2019); Reduction mammaplasty (10/2019); Esophagogastroduodenoscopy (N/A, 05/08/2021); cysto with hydrodistension (N/A, 09/26/2021); Cystoscopy w/ retrogrades (Bilateral, 09/26/2021); Cystoscopy with biopsy (N/A, 09/26/2021); Ureteroscopy (Bilateral, 09/26/2021); Esophagogastroduodenoscopy (11/25/2017); Xi robotic assisted oophorectomy (N/A, 12/22/2021); and Excision vaginal cyst (N/A, 12/22/2021). Family: family history includes Cancer in her father; Hypertension in her father and another family member.  Laboratory Chemistry Profile   Renal Lab Results  Component Value Date   BUN 11 08/26/2021   CREATININE 0.80 11/16/2021   BCR 10 07/01/2019   GFRAA 86 07/01/2019   GFRNONAA >60 08/26/2021     Hepatic Lab Results  Component Value Date   AST 20 08/10/2021   ALT 12 08/10/2021   ALBUMIN 3.3 (L) 08/10/2021   ALKPHOS 58 08/10/2021   LIPASE 34 08/10/2021     Electrolytes Lab Results  Component Value Date   NA 136 08/26/2021   K 3.5 08/26/2021   CL 99 08/26/2021   CALCIUM 9.6 08/26/2021     Bone No results found for: "VD25OH", "VD125OH2TOT", "EN4076KG8", "UP1031RX4", "25OHVITD1", "25OHVITD2", "25OHVITD3", "TESTOFREE", "TESTOSTERONE"   Inflammation (CRP: Acute Phase) (ESR: Chronic Phase) Lab Results  Component Value Date   ESRSEDRATE 35 (H) 08/12/2021       Note: Above Lab results reviewed.  Recent Imaging Review  CT ABDOMEN PELVIS W CONTRAST CLINICAL DATA:  Left lower quadrant pain  EXAM: CT ABDOMEN AND PELVIS WITH CONTRAST  TECHNIQUE: Multidetector CT imaging of the abdomen and pelvis was performed using the standard protocol following bolus administration of intravenous contrast.  RADIATION DOSE REDUCTION: This exam was performed according to the departmental dose-optimization program which includes automated exposure control, adjustment of the mA and/or kV according to patient size and/or use of iterative reconstruction technique.  CONTRAST:   11m OMNIPAQUE IOHEXOL 300 MG/ML  SOLN  COMPARISON:  CT 11/16/2021  FINDINGS: Lower chest: Lung bases demonstrate no acute airspace disease.  Hepatobiliary: No focal liver abnormality is seen. Status post cholecystectomy. No biliary dilatation.  Pancreas: Unremarkable. No pancreatic ductal dilatation or surrounding inflammatory changes.  Spleen: Normal in size without focal abnormality.  Adrenals/Urinary Tract: Adrenal glands are within normal limits. No hydronephrosis. Mild urothelial enhancement of right ureter, for example series 2, image 50. No obstructing stone. The urinary bladder is unremarkable  Stomach/Bowel: The stomach is nonenlarged. No dilated small bowel. Mild wall thickening and stranding involving distal descending colon and rectosigmoid colon. No significant diverticular disease. Negative appendix  Vascular/Lymphatic: Nonaneurysmal aorta. No suspicious lymph nodes. Minimal Peri aortic stranding without significant change.  Reproductive: Status post hysterectomy. The previously noted left adnexal complex cyst is no longer visualized.  Other: Negative for pelvic effusion or free air.  Musculoskeletal: No acute osseous abnormality  IMPRESSION: 1. Mild wall thickening and inflammatory change involving the distal descending and rectosigmoid colon suspicious for colitis of infectious or inflammatory etiology. Negative for intramural air or free air. 2. Mild urothelial enhancement of right ureter, question ascending urinary tract infection, this may be correlated with urinalysis. 3. Other chronic findings as discussed above  Electronically Signed   By: KDonavan FoilM.D.   On: 04/10/2022 16:13  Note: Reviewed  Physical Exam  General appearance: Well nourished, well developed, and well hydrated. In no apparent acute distress Mental status: Alert, oriented x 3 (person, place, & time)       Respiratory: No evidence of acute respiratory  distress Eyes: PERLA Vitals: BP 100/84   Pulse (!) 116   Temp (!) 97 F (36.1 C)   Resp 16   Ht '5\' 1"'  (1.549 m)   Wt 146 lb (66.2 kg)   SpO2 98%   BMI 27.59 kg/m  BMI: Estimated [name] mass index is 27.59 kg/m as calculated from the following:   Height as of this encounter: '5\' 1"'  (1.549 m).   Weight as of this encounter: 146 lb (66.2 kg). Ideal: Ideal [name] weight: 47.8 kg (105 lb 6.1 oz) Adjusted ideal [name] weight: 55.2 kg (121 lb 10 oz)  Cervical Spine Exam  Skin & Axial Inspection: Well healed scar from previous spine surgery detected Alignment: Symmetrical Functional ROM: Pain restricted ROM      Stability: No instability detected Muscle Tone/Strength: Functionally intact. No obvious neuro-muscular anomalies detected. Sensory (Neurological): Neurogenic pain pattern Palpation: No palpable anomalies                          Upper Extremity (UE) Exam      Side: Right upper extremity   Side: Left upper extremity    Skin & Extremity Inspection: Skin color, temperature, and hair growth are WNL. No peripheral edema or cyanosis. No masses, redness, swelling, asymmetry, or associated skin lesions. No contractures.   Skin & Extremity Inspection: Skin color, temperature, and hair growth are WNL. No peripheral edema or cyanosis. No masses, redness, swelling, asymmetry, or associated skin lesions. No contractures.    Functional ROM: Unrestricted ROM           Functional ROM: Unrestricted ROM            Muscle Tone/Strength: Functionally intact. No obvious neuro-muscular anomalies detected.   Muscle Tone/Strength: Functionally intact. No obvious neuro-muscular anomalies detected.    Sensory (Neurological): Unimpaired           Sensory (Neurological): Unimpaired            Palpation: No palpable anomalies               Palpation: No palpable anomalies                Provocative Test(s):  Phalen's test: deferred Tinel's test: deferred Apley's scratch test (touch opposite shoulder):  Action 1  (Across chest): deferred Action 2 (Overhead): deferred Action 3 (LB reach): deferred     Provocative Test(s):  Phalen's test: deferred Tinel's test: deferred Apley's scratch test (touch opposite shoulder):  Action 1 (Across chest): deferred Action 2 (Overhead): deferred Action 3 (LB reach): deferred       Assessment   Status Diagnosis  Controlled Controlled Controlled 1. Cervical spondylosis without myelopathy   2. Hx of cervical spine surgery   3. Bilateral occipital neuralgia   4. Cervical myofascial pain syndrome   5. Cervical fusion syndrome (C2-T1)   6. Degenerative disc disease, cervical   7. Chronic pain syndrome         Plan of Care   Kimberly [name] has a current medication list which includes the following long-term medication(s): amitriptyline and gabapentin.  Pharmacotherapy (Medications Ordered): Meds ordered this encounter  Medications   Oxycodone HCl 10 MG TABS    Sig: Take 1 tablet (10 mg  total) by mouth every 8 (eight) hours as needed (pain).    Dispense:  90 tablet    Refill:  0   Oxycodone HCl 10 MG TABS    Sig: Take 1 tablet (10 mg total) by mouth every 8 (eight) hours as needed (pain).    Dispense:  90 tablet    Refill:  0   Oxycodone HCl 10 MG TABS    Sig: Take 1 tablet (10 mg total) by mouth every 8 (eight) hours as needed (pain).    Dispense:  90 tablet    Refill:  0   gabapentin (NEURONTIN) 600 MG tablet    Sig: Take 1 tablet (600 mg total) by mouth 3 (three) times daily.    Dispense:  90 tablet    Refill:  5   UDS up-to-date and appropriate.  Follow-up plan:   Return in about 4 months (around 08/30/2022) for Medication Management, in person.     cervical TPI, cervical facet medial branch nerve block, suprascapular nerve block, bilateral C4, C5, C6   cervical facet medial branch nerve block 07/04/2020: Not effective, do not repeat.        Recent Visits No visits were found meeting these conditions. Showing recent visits within  past 90 days and meeting all other requirements Today's Visits Date Type Provider Dept  05/15/22 Office Visit Kimberly Santa, MD [facility]  Showing today's visits and meeting all other requirements Future Appointments No visits were found meeting these conditions. Showing future appointments within next 90 days and meeting all other requirements  I discussed the assessment and treatment plan with the patient. The patient was provided an opportunity to ask questions and all were answered. The patient agreed with the plan and demonstrated an understanding of the instructions.  Patient advised to call back or seek an in-person evaluation if the symptoms or condition worsens.  Duration of encounter: 30 minutes.  Note by: Kimberly Santa, MD Date: 05/15/2022; Time: 1:46 PM

## 2022-05-16 NOTE — Progress Notes (Deleted)
05/17/2022 2:25 PM   Kimberly [name] 04-05-1961 [id number]  Referring provider: Dion Body, MD [facility] [place],  [place] [address]  Urological history: 1.  High risk hematuria -Non-smoker -CTU (08/25/2021) - Punctuate nonobstructive calculus of the superior pole of the left kidney -cysto/hydro distention w/ bilateral RTG's (08/2021) - Post dilation glomerulations were noted lower lateral and posterior walls/trigone -contrast CT (03/2022) - Mild urothelial enhancement of right ureter - urine culture positive for E.coli -no reports of gross heme -UA ***  2. Interstitial cystitis -cysto/hydrodistention in February 2023 which noted post dilation glomerulations -Uribel, amitriptyline 25 mg and hydroxyzine 10 mg TID, prn   3.  Nephrolithiasis -CTU (07/2021) - Punctuate nonobstructive calculus of the superior pole of the left kidney  No chief complaint on file.   HPI: Kimberly [name] is a 61 y.o. female who presents today for painful urination.  UA ***  PVR ***   PMH: Past Medical History:  Diagnosis Date   Anxiety    takes alprazolam - rare use    Arthritis    cervical spondylosis    Asthma    Balance problem    Brain cyst    Carpal tunnel syndrome, bilateral    Cervical fusion syndrome    Complete rupture of rotator cuff    Complication of anesthesia    lung collapsed after neck fusion   Degenerative disc disease, cervical    Depression    Difficult intubation    Small mouth opening, limited neck flexion, very anterior    Flu 08/10/2018   tested positive   GERD (gastroesophageal reflux disease)    pt. reports that its better, no meds in use at this time- 2015   Headache    Herpes    History of kidney stones    History of pneumonia    Hyperlipidemia    Hypertension    pt. doesn't see cardiologist, followed for HTN by Dr. Gerarda Fraction   Hypothyroidism    Inflammation of shoulder joint    Memory difficulties     Neuromuscular disorder ([place])    joint and muscle problems   Occipital neuralgia    Pneumothorax on right    following C4-6 ACDF 07/04/16   Pseudoarthrosis of cervical spine (HCC)    Recurrent falls    Syncope    Urinary frequency     Surgical History: Past Surgical History:  Procedure Laterality Date   ABDOMINAL HYSTERECTOMY     ANTERIOR CERVICAL DECOMP/DISCECTOMY FUSION N/A 08/06/2014   Procedure: ANTERIOR CERVICAL DECOMPRESSION/DISCECTOMY FUSION CERVICAL THREE-FOUR,CERVICAL SIX-SEVEN ,CERVICAL SEVEN-THORACIC ONE;  Surgeon: Floyce Stakes, MD;  Location: [place];  Service: Neurosurgery;  Laterality: N/A;   ANTERIOR CERVICAL DECOMP/DISCECTOMY FUSION N/A 07/04/2016   Procedure: CERVICAL FOUR-FIVE, CERVICAL FIVE-SIX ANTERIOR CERVICAL DECOMPRESSION/DISCECTOMY/FUSION WITH REVISION OF CERVICAL THREE-FOUR PLATE;  Surgeon: Leeroy Cha, MD;  Location: [place];  Service: Neurosurgery;  Laterality: N/A;   CARPAL TUNNEL RELEASE Left 02/16/2013   Procedure: CARPAL TUNNEL RELEASE;  Surgeon: Carole Civil, MD;  Location: [facility];  Service: Orthopedics;  Laterality: Left;   CARPAL TUNNEL RELEASE Right 03/27/2013   Procedure: RIGHT CARPAL TUNNEL RELEASE;  Surgeon: Carole Civil, MD;  Location: [facility];  Service: Orthopedics;  Laterality: Right;   CESAREAN SECTION     x2   CHOLECYSTECTOMY N/A 06/17/2015   Procedure: LAPAROSCOPIC CHOLECYSTECTOMY;  Surgeon: Aviva Signs, MD;  Location: [facility];  Service: General;  Laterality: N/A;   COLONOSCOPY  COLONOSCOPY WITH PROPOFOL N/A 05/06/2019   Procedure: COLONOSCOPY WITH PROPOFOL;  Surgeon: Robert Bellow, MD;  Location: [facility];  Service: Endoscopy;  Laterality: N/A;   CYSTO WITH HYDRODISTENSION N/A 09/26/2021   Procedure: CYSTOSCOPY/HYDRODISTENSION;  Surgeon: Abbie Sons, MD;  Location: [facility];  Service: Urology;  Laterality: N/A;   CYSTOSCOPY W/ RETROGRADES Bilateral 09/26/2021   Procedure: CYSTOSCOPY WITH RETROGRADE  PYELOGRAM;  Surgeon: Abbie Sons, MD;  Location: [facility];  Service: Urology;  Laterality: Bilateral;   CYSTOSCOPY WITH BIOPSY N/A 09/26/2021   Procedure: CYSTOSCOPY WITH BIOPSY;  Surgeon: Abbie Sons, MD;  Location: [facility];  Service: Urology;  Laterality: N/A;   ESOPHAGOGASTRODUODENOSCOPY N/A 05/08/2021   Procedure: ESOPHAGOGASTRODUODENOSCOPY (EGD);  Surgeon: Lesly Rubenstein, MD;  Location: [facility];  Service: Endoscopy;  Laterality: N/A;   ESOPHAGOGASTRODUODENOSCOPY  11/25/2017   EXCISION VAGINAL CYST N/A 12/22/2021   Procedure: EXCISION VULVAR CYST;  Surgeon: Benjaman Kindler, MD;  Location: [facility];  Service: Gynecology;  Laterality: N/A;   EXTRACORPOREAL SHOCK WAVE LITHOTRIPSY Left 09/04/2018   Procedure: EXTRACORPOREAL SHOCK WAVE LITHOTRIPSY (ESWL);  Surgeon: Billey Co, MD;  Location: [facility];  Service: Urology;  Laterality: Left;   FOREIGN BODY REMOVAL Left    knee-as child   KNEE ARTHROSCOPY     NASAL SINUS SURGERY N/A 04/13/2015   Procedure: nasal endoscopy with adenoid biopsy;  Surgeon: Ruby Cola, MD;  Location: [place];  Service: ENT;  Laterality: N/A;   NECK SURGERY  2016   x 3 all together   POSTERIOR CERVICAL FUSION/FORAMINOTOMY N/A 11/13/2016   Procedure: CERVICAL TWO-CERVICAL SIX POSTERIOR CERVICAL FUSION WITH LATERAL MASS FIXATION;  Surgeon: Kristeen Miss, MD;  Location: [place];  Service: Neurosurgery;  Laterality: N/A;  posterior approach   REDUCTION MAMMAPLASTY  10/2019   ROTATOR CUFF REPAIR Left 2014   SHOULDER ACROMIOPLASTY Left 05/18/2015   Procedure: SHOULDER ACROMIOPLASTY;  Surgeon: Earlie Server, MD;  Location: [place];  Service: Orthopedics;  Laterality: Left;   SHOULDER ARTHROSCOPY WITH DISTAL CLAVICLE RESECTION Left 05/18/2015   Procedure: LEFT SHOULDER ARTHROSCOPY WITH  DISTAL CLAVICLE RESECTION;  Surgeon: Earlie Server, MD;  Location: [place];  Service: Orthopedics;  Laterality: Left;    URETEROSCOPY Bilateral 09/26/2021   Procedure: URETEROSCOPY;  Surgeon: Abbie Sons, MD;  Location: [facility];  Service: Urology;  Laterality: Bilateral;   XI ROBOTIC ASSISTED OOPHORECTOMY N/A 12/22/2021   Procedure: XI ROBOTIC ASSISTED DIAGNOSTIC LAPAROSCOPY, LEFT OOPHORECTOMY, WITH RIGHT SALPINGECTOMY AND LYSIS OF ADHESIONS;  Surgeon: Benjaman Kindler, MD;  Location: [facility];  Service: Gynecology;  Laterality: N/A;    Home Medications:  Allergies as of 05/17/2022       Reactions   Shellfish Allergy Anaphylaxis, Hives   "Swells throat up"   Peanut-containing Drug Products Hives   Codeine Other (See Comments)   jitters   Diphtheria Toxoid Rash   Tetanus Toxoids Rash        Medication List        Accurate as of May 16, 2022  2:25 PM. If you have any questions, ask your nurse or doctor.          acetaminophen 500 MG tablet Commonly known as: TYLENOL Take 500 mg by mouth 2 (two) times daily as needed for headache.   amitriptyline 25 MG tablet Commonly known as: ELAVIL Take 1 tablet (25 mg total) by mouth at bedtime. What changed:  when to take this reasons to take this   amLODipine 5  MG tablet Commonly known as: NORVASC Take 5 mg by mouth daily.   cholecalciferol 25 MCG (1000 UNIT) tablet Commonly known as: VITAMIN D3 Take 1,000 Units by mouth daily.   estradiol 1 MG tablet Commonly known as: ESTRACE Take 1.5 mg by mouth daily.   gabapentin 600 MG tablet Commonly known as: Neurontin Take 1 tablet (600 mg total) by mouth 3 (three) times daily.   hydrOXYzine 10 MG tablet Commonly known as: ATARAX Take 1 tablet (10 mg total) by mouth 3 (three) times daily as needed. What changed: reasons to take this   meloxicam 15 MG tablet Commonly known as: MOBIC Take 15 mg by mouth daily as needed for pain.   NONFORMULARY OR COMPOUNDED ITEM Place 10 mg vaginally at bedtime as needed (spasms). Diazepam 10 mg   Oxycodone HCl 10 MG Tabs Take 1 tablet (10 mg  total) by mouth every 8 (eight) hours as needed (pain). Start taking on: June 06, 2022   Oxycodone HCl 10 MG Tabs Take 1 tablet (10 mg total) by mouth every 8 (eight) hours as needed (pain). Start taking on: July 06, 2022   Oxycodone HCl 10 MG Tabs Take 1 tablet (10 mg total) by mouth every 8 (eight) hours as needed (pain). Start taking on: August 05, 2022   pantoprazole 40 MG tablet Commonly known as: PROTONIX Take 40 mg by mouth every morning.   rizatriptan 10 MG tablet Commonly known as: MAXALT Take 10 mg by mouth as needed for migraine. May repeat in 2 hours if needed   Uribel 118 MG Caps TAKE 1 CAPSULE BY MOUTH AS NEEDED   valACYclovir 1000 MG tablet Commonly known as: VALTREX Take 1,000 mg by mouth daily.   VISINE OP Apply 1 drop to eye daily as needed (irritation).        Allergies:  Allergies  Allergen Reactions   Shellfish Allergy Anaphylaxis and Hives    "Swells throat up"   Peanut-Containing Drug Products Hives   Codeine Other (See Comments)    jitters   Diphtheria Toxoid Rash   Tetanus Toxoids Rash    Family History: Family History  Problem Relation Age of Onset   Cancer Father        prostate cancer   Hypertension Father    Hypertension Other    Breast cancer Neg Hx     Social History:  reports that she has never smoked. She has never used smokeless tobacco. She reports current alcohol use. She reports that she does not use drugs.  ROS: Pertinent ROS in HPI  Physical Exam: There were no vitals taken for this visit.  Constitutional:  Well nourished. Alert and oriented, No acute distress. HEENT: [place] AT, moist mucus membranes.  Trachea midline, no masses. Cardiovascular: No clubbing, cyanosis, or edema. Respiratory: Normal respiratory effort, no increased work of breathing. GU: No CVA tenderness.  No bladder fullness or masses. Vulvovaginal atrophy w/ pallor, loss of rugae, introital retraction, excoriations.  Vulvar thinning, fusion of  labia, clitoral hood retraction, prominent urethral meatus.   *** external genitalia, *** pubic hair distribution, no lesions.  Normal urethral meatus, no lesions, no prolapse, no discharge.   No urethral masses, tenderness and/or tenderness. No bladder fullness, tenderness or masses. *** vagina mucosa, *** estrogen effect, no discharge, no lesions, *** pelvic support, *** cystocele and *** rectocele noted.  No cervical motion tenderness.  Uterus is freely mobile and non-fixed.  No adnexal/parametria masses or tenderness noted.  Anus and perineum are without rashes  or lesions.   ***  Neurologic: Grossly intact, no focal deficits, moving all 4 extremities. Psychiatric: Normal mood and affect.    Laboratory Data: WBC (White Blood Cell Count) 4.1 - 10.2 10^3/uL 10.1   RBC (Red Blood Cell Count) 4.04 - 5.48 10^6/uL 4.12   Hemoglobin 12.0 - 15.0 gm/dL 12.7   Hematocrit 35.0 - 47.0 % 39.5   MCV (Mean Corpuscular Volume) 80.0 - 100.0 fl 95.9   MCH (Mean Corpuscular Hemoglobin) 27.0 - 31.2 pg 30.8   MCHC (Mean Corpuscular Hemoglobin Concentration) 32.0 - 36.0 gm/dL 32.2   Platelet Count 150 - 450 10^3/uL 272   RDW-CV (Red Cell Distribution Width) 11.6 - 14.8 % 11.8   MPV (Mean Platelet Volume) 9.4 - 12.4 fl 9.6   Neutrophils 1.50 - 7.80 10^3/uL 6.47   Lymphocytes 1.00 - 3.60 10^3/uL 2.53   Monocytes 0.00 - 1.50 10^3/uL 0.78   Eosinophils 0.00 - 0.55 10^3/uL 0.23   Basophils 0.00 - 0.09 10^3/uL 0.08   Neutrophil % 32.0 - 70.0 % 64.1   Lymphocyte % 10.0 - 50.0 % 25.0   Monocyte % 4.0 - 13.0 % 7.7   Eosinophil % 1.0 - 5.0 % 2.3   Basophil% 0.0 - 2.0 % 0.8   Immature Granulocyte % <=0.7 % 0.1   Immature Granulocyte Count <=0.06 10^3/L 0.01   Resulting Agency  [facility]   Specimen Collected: 04/06/22 12:41   Performed by: [facility] Last Resulted: 04/06/22 13:33  Received From: [facility]  Result Received: 04/10/22 13:36   Glucose 70 - 110  mg/dL 82   Sodium 136 - 145 mmol/L 138   Potassium 3.6 - 5.1 mmol/L 3.3 Low    Chloride 97 - 109 mmol/L 99   Carbon Dioxide (CO2) 22.0 - 32.0 mmol/L 31.4   Urea Nitrogen (BUN) 7 - 25 mg/dL 9   Creatinine 0.6 - 1.1 mg/dL 0.9   Glomerular Filtration Rate (eGFR), MDRD Estimate >60 mL/min/1.73sq m 77   Calcium 8.7 - 10.3 mg/dL 9.6   AST  8 - 39 U/L 18   ALT  5 - 38 U/L 15   Alk Phos (alkaline Phosphatase) 34 - 104 U/L 66   Albumin 3.5 - 4.8 g/dL 4.1   Bilirubin, Total 0.3 - 1.2 mg/dL 0.7   Protein, Total 6.1 - 7.9 g/dL 7.4   A/G Ratio 1.0 - 5.0 gm/dL 1.2   Resulting Agency  [facility] - [facility]   Specimen Collected: 04/06/22 12:41   Performed by: [facility] Last Resulted: 04/06/22 16:45  Received From: [facility]  Result Received: 04/10/22 13:36   Urinalysis ***   I have reviewed the labs.   Pertinent Imaging: ***  Assessment & Plan:  ***  1. Painful urination  No follow-ups on file.  These notes generated with voice recognition software. I apologize for typographical errors.  [place], [place] [address]  [place], [place] [id number] [phone]

## 2022-05-17 ENCOUNTER — Ambulatory Visit: Payer: Medicare Other | Admitting: Urology

## 2022-05-17 DIAGNOSIS — R309 Painful micturition, unspecified: Secondary | ICD-10-CM

## 2022-05-28 NOTE — Progress Notes (Deleted)
05/30/2022 9:33 PM   Kimberly [name] May 07, 1961 [id number]  Referring provider: Dion Body, MD [facility] [place],  [place] [address]  Urological history: 1.  High risk hematuria -Non-smoker -CTU (08/25/2021) - Punctuate nonobstructive calculus of the superior pole of the left kidney -cysto/hydro distention w/ bilateral RTG's (08/2021) - Post dilation glomerulations were noted lower lateral and posterior walls/trigone -contrast CT (03/2022) - Mild urothelial enhancement of right ureter - urine culture positive for E.coli -no reports of gross heme -UA ***  2. Interstitial cystitis -cysto/hydrodistention in February 2023 which noted post dilation glomerulations -Uribel, amitriptyline 25 mg and hydroxyzine 10 mg TID, prn   3.  Nephrolithiasis -CTU (07/2021) - Punctuate nonobstructive calculus of the superior pole of the left kidney  No chief complaint on file.   HPI: Kimberly [name] is a 61 y.o. female who presents today for painful urination.  UA ***  PVR ***   PMH: Past Medical History:  Diagnosis Date   Anxiety    takes alprazolam - rare use    Arthritis    cervical spondylosis    Asthma    Balance problem    Brain cyst    Carpal tunnel syndrome, bilateral    Cervical fusion syndrome    Complete rupture of rotator cuff    Complication of anesthesia    lung collapsed after neck fusion   Degenerative disc disease, cervical    Depression    Difficult intubation    Small mouth opening, limited neck flexion, very anterior    Flu 08/10/2018   tested positive   GERD (gastroesophageal reflux disease)    pt. reports that its better, no meds in use at this time- 2015   Headache    Herpes    History of kidney stones    History of pneumonia    Hyperlipidemia    Hypertension    pt. doesn't see cardiologist, followed for HTN by Dr. Gerarda Fraction   Hypothyroidism    Inflammation of shoulder joint    Memory difficulties     Neuromuscular disorder ([place])    joint and muscle problems   Occipital neuralgia    Pneumothorax on right    following C4-6 ACDF 07/04/16   Pseudoarthrosis of cervical spine (HCC)    Recurrent falls    Syncope    Urinary frequency     Surgical History: Past Surgical History:  Procedure Laterality Date   ABDOMINAL HYSTERECTOMY     ANTERIOR CERVICAL DECOMP/DISCECTOMY FUSION N/A 08/06/2014   Procedure: ANTERIOR CERVICAL DECOMPRESSION/DISCECTOMY FUSION CERVICAL THREE-FOUR,CERVICAL SIX-SEVEN ,CERVICAL SEVEN-THORACIC ONE;  Surgeon: Floyce Stakes, MD;  Location: [facility];  Service: Neurosurgery;  Laterality: N/A;   ANTERIOR CERVICAL DECOMP/DISCECTOMY FUSION N/A 07/04/2016   Procedure: CERVICAL FOUR-FIVE, CERVICAL FIVE-SIX ANTERIOR CERVICAL DECOMPRESSION/DISCECTOMY/FUSION WITH REVISION OF CERVICAL THREE-FOUR PLATE;  Surgeon: Leeroy Cha, MD;  Location: [place];  Service: Neurosurgery;  Laterality: N/A;   CARPAL TUNNEL RELEASE Left 02/16/2013   Procedure: CARPAL TUNNEL RELEASE;  Surgeon: Carole Civil, MD;  Location: [facility];  Service: Orthopedics;  Laterality: Left;   CARPAL TUNNEL RELEASE Right 03/27/2013   Procedure: RIGHT CARPAL TUNNEL RELEASE;  Surgeon: Carole Civil, MD;  Location: [facility];  Service: Orthopedics;  Laterality: Right;   CESAREAN SECTION     x2   CHOLECYSTECTOMY N/A 06/17/2015   Procedure: LAPAROSCOPIC CHOLECYSTECTOMY;  Surgeon: Aviva Signs, MD;  Location: [facility];  Service: General;  Laterality: N/A;   COLONOSCOPY  COLONOSCOPY WITH PROPOFOL N/A 05/06/2019   Procedure: COLONOSCOPY WITH PROPOFOL;  Surgeon: Robert Bellow, MD;  Location: [facility];  Service: Endoscopy;  Laterality: N/A;   CYSTO WITH HYDRODISTENSION N/A 09/26/2021   Procedure: CYSTOSCOPY/HYDRODISTENSION;  Surgeon: Abbie Sons, MD;  Location: [facility];  Service: Urology;  Laterality: N/A;   CYSTOSCOPY W/ RETROGRADES Bilateral 09/26/2021   Procedure: CYSTOSCOPY WITH RETROGRADE  PYELOGRAM;  Surgeon: Abbie Sons, MD;  Location: [facility];  Service: Urology;  Laterality: Bilateral;   CYSTOSCOPY WITH BIOPSY N/A 09/26/2021   Procedure: CYSTOSCOPY WITH BIOPSY;  Surgeon: Abbie Sons, MD;  Location: [facility];  Service: Urology;  Laterality: N/A;   ESOPHAGOGASTRODUODENOSCOPY N/A 05/08/2021   Procedure: ESOPHAGOGASTRODUODENOSCOPY (EGD);  Surgeon: Lesly Rubenstein, MD;  Location: [facility];  Service: Endoscopy;  Laterality: N/A;   ESOPHAGOGASTRODUODENOSCOPY  11/25/2017   EXCISION VAGINAL CYST N/A 12/22/2021   Procedure: EXCISION VULVAR CYST;  Surgeon: Benjaman Kindler, MD;  Location: [facility];  Service: Gynecology;  Laterality: N/A;   EXTRACORPOREAL SHOCK WAVE LITHOTRIPSY Left 09/04/2018   Procedure: EXTRACORPOREAL SHOCK WAVE LITHOTRIPSY (ESWL);  Surgeon: Billey Co, MD;  Location: [facility];  Service: Urology;  Laterality: Left;   FOREIGN BODY REMOVAL Left    knee-as child   KNEE ARTHROSCOPY     NASAL SINUS SURGERY N/A 04/13/2015   Procedure: nasal endoscopy with adenoid biopsy;  Surgeon: Ruby Cola, MD;  Location: [facility];  Service: ENT;  Laterality: N/A;   NECK SURGERY  2016   x 3 all together   POSTERIOR CERVICAL FUSION/FORAMINOTOMY N/A 11/13/2016   Procedure: CERVICAL TWO-CERVICAL SIX POSTERIOR CERVICAL FUSION WITH LATERAL MASS FIXATION;  Surgeon: Kristeen Miss, MD;  Location: [place] [facility];  Service: Neurosurgery;  Laterality: N/A;  posterior approach   REDUCTION MAMMAPLASTY  10/2019   ROTATOR CUFF REPAIR Left 2014   SHOULDER ACROMIOPLASTY Left 05/18/2015   Procedure: SHOULDER ACROMIOPLASTY;  Surgeon: Earlie Server, MD;  Location: [facility];  Service: Orthopedics;  Laterality: Left;   SHOULDER ARTHROSCOPY WITH DISTAL CLAVICLE RESECTION Left 05/18/2015   Procedure: LEFT SHOULDER ARTHROSCOPY WITH  DISTAL CLAVICLE RESECTION;  Surgeon: Earlie Server, MD;  Location: [facility];  Service: Orthopedics;  Laterality: Left;    URETEROSCOPY Bilateral 09/26/2021   Procedure: URETEROSCOPY;  Surgeon: Abbie Sons, MD;  Location: [facility];  Service: Urology;  Laterality: Bilateral;   XI ROBOTIC ASSISTED OOPHORECTOMY N/A 12/22/2021   Procedure: XI ROBOTIC ASSISTED DIAGNOSTIC LAPAROSCOPY, LEFT OOPHORECTOMY, WITH RIGHT SALPINGECTOMY AND LYSIS OF ADHESIONS;  Surgeon: Benjaman Kindler, MD;  Location: [facility];  Service: Gynecology;  Laterality: N/A;    Home Medications:  Allergies as of 05/30/2022       Reactions   Shellfish Allergy Anaphylaxis, Hives   "Swells throat up"   Peanut-containing Drug Products Hives   Codeine Other (See Comments)   jitters   Diphtheria Toxoid Rash   Tetanus Toxoids Rash        Medication List        Accurate as of May 28, 2022  9:33 PM. If you have any questions, ask your nurse or doctor.          acetaminophen 500 MG tablet Commonly known as: TYLENOL Take 500 mg by mouth 2 (two) times daily as needed for headache.   amitriptyline 25 MG tablet Commonly known as: ELAVIL Take 1 tablet (25 mg total) by mouth at bedtime. What changed:  when to take this reasons to take this   amLODipine 5  MG tablet Commonly known as: NORVASC Take 5 mg by mouth daily.   cholecalciferol 25 MCG (1000 UNIT) tablet Commonly known as: VITAMIN D3 Take 1,000 Units by mouth daily.   estradiol 1 MG tablet Commonly known as: ESTRACE Take 1.5 mg by mouth daily.   gabapentin 600 MG tablet Commonly known as: Neurontin Take 1 tablet (600 mg total) by mouth 3 (three) times daily.   hydrOXYzine 10 MG tablet Commonly known as: ATARAX Take 1 tablet (10 mg total) by mouth 3 (three) times daily as needed. What changed: reasons to take this   meloxicam 15 MG tablet Commonly known as: MOBIC Take 15 mg by mouth daily as needed for pain.   NONFORMULARY OR COMPOUNDED ITEM Place 10 mg vaginally at bedtime as needed (spasms). Diazepam 10 mg   Oxycodone HCl 10 MG Tabs Take 1 tablet (10 mg  total) by mouth every 8 (eight) hours as needed (pain). Start taking on: June 06, 2022   Oxycodone HCl 10 MG Tabs Take 1 tablet (10 mg total) by mouth every 8 (eight) hours as needed (pain). Start taking on: July 06, 2022   Oxycodone HCl 10 MG Tabs Take 1 tablet (10 mg total) by mouth every 8 (eight) hours as needed (pain). Start taking on: August 05, 2022   pantoprazole 40 MG tablet Commonly known as: PROTONIX Take 40 mg by mouth every morning.   rizatriptan 10 MG tablet Commonly known as: MAXALT Take 10 mg by mouth as needed for migraine. May repeat in 2 hours if needed   Uribel 118 MG Caps TAKE 1 CAPSULE BY MOUTH AS NEEDED   valACYclovir 1000 MG tablet Commonly known as: VALTREX Take 1,000 mg by mouth daily.   VISINE OP Apply 1 drop to eye daily as needed (irritation).        Allergies:  Allergies  Allergen Reactions   Shellfish Allergy Anaphylaxis and Hives    "Swells throat up"   Peanut-Containing Drug Products Hives   Codeine Other (See Comments)    jitters   Diphtheria Toxoid Rash   Tetanus Toxoids Rash    Family History: Family History  Problem Relation Age of Onset   Cancer Father        prostate cancer   Hypertension Father    Hypertension Other    Breast cancer Neg Hx     Social History:  reports that she has never smoked. She has never used smokeless tobacco. She reports current alcohol use. She reports that she does not use drugs.  ROS: Pertinent ROS in HPI  Physical Exam: There were no vitals taken for this visit.  Constitutional:  Well nourished. Alert and oriented, No acute distress. HEENT: [place] AT, moist mucus membranes.  Trachea midline, no masses. Cardiovascular: No clubbing, cyanosis, or edema. Respiratory: Normal respiratory effort, no increased work of breathing. GU: No CVA tenderness.  No bladder fullness or masses. Vulvovaginal atrophy w/ pallor, loss of rugae, introital retraction, excoriations.  Vulvar thinning, fusion of  labia, clitoral hood retraction, prominent urethral meatus.   *** external genitalia, *** pubic hair distribution, no lesions.  Normal urethral meatus, no lesions, no prolapse, no discharge.   No urethral masses, tenderness and/or tenderness. No bladder fullness, tenderness or masses. *** vagina mucosa, *** estrogen effect, no discharge, no lesions, *** pelvic support, *** cystocele and *** rectocele noted.  No cervical motion tenderness.  Uterus is freely mobile and non-fixed.  No adnexal/parametria masses or tenderness noted.  Anus and perineum are without rashes  or lesions.   ***  Neurologic: Grossly intact, no focal deficits, moving all 4 extremities. Psychiatric: Normal mood and affect.    Laboratory Data: WBC (White Blood Cell Count) 4.1 - 10.2 10^3/uL 10.1   RBC (Red Blood Cell Count) 4.04 - 5.48 10^6/uL 4.12   Hemoglobin 12.0 - 15.0 gm/dL 12.7   Hematocrit 35.0 - 47.0 % 39.5   MCV (Mean Corpuscular Volume) 80.0 - 100.0 fl 95.9   MCH (Mean Corpuscular Hemoglobin) 27.0 - 31.2 pg 30.8   MCHC (Mean Corpuscular Hemoglobin Concentration) 32.0 - 36.0 gm/dL 32.2   Platelet Count 150 - 450 10^3/uL 272   RDW-CV (Red Cell Distribution Width) 11.6 - 14.8 % 11.8   MPV (Mean Platelet Volume) 9.4 - 12.4 fl 9.6   Neutrophils 1.50 - 7.80 10^3/uL 6.47   Lymphocytes 1.00 - 3.60 10^3/uL 2.53   Monocytes 0.00 - 1.50 10^3/uL 0.78   Eosinophils 0.00 - 0.55 10^3/uL 0.23   Basophils 0.00 - 0.09 10^3/uL 0.08   Neutrophil % 32.0 - 70.0 % 64.1   Lymphocyte % 10.0 - 50.0 % 25.0   Monocyte % 4.0 - 13.0 % 7.7   Eosinophil % 1.0 - 5.0 % 2.3   Basophil% 0.0 - 2.0 % 0.8   Immature Granulocyte % <=0.7 % 0.1   Immature Granulocyte Count <=0.06 10^3/L 0.01   Resulting Agency  [facility]   Specimen Collected: 04/06/22 12:41   Performed by: [facility] Last Resulted: 04/06/22 13:33  Received From: [facility]  Result Received: 04/10/22 13:36   Glucose 70 - 110  mg/dL 82   Sodium 136 - 145 mmol/L 138   Potassium 3.6 - 5.1 mmol/L 3.3 Low    Chloride 97 - 109 mmol/L 99   Carbon Dioxide (CO2) 22.0 - 32.0 mmol/L 31.4   Urea Nitrogen (BUN) 7 - 25 mg/dL 9   Creatinine 0.6 - 1.1 mg/dL 0.9   Glomerular Filtration Rate (eGFR), MDRD Estimate >60 mL/min/1.73sq m 77   Calcium 8.7 - 10.3 mg/dL 9.6   AST  8 - 39 U/L 18   ALT  5 - 38 U/L 15   Alk Phos (alkaline Phosphatase) 34 - 104 U/L 66   Albumin 3.5 - 4.8 g/dL 4.1   Bilirubin, Total 0.3 - 1.2 mg/dL 0.7   Protein, Total 6.1 - 7.9 g/dL 7.4   A/G Ratio 1.0 - 5.0 gm/dL 1.2   Resulting Agency  [facility]   Specimen Collected: 04/06/22 12:41   Performed by: [facility] Last Resulted: 04/06/22 16:45  Received From: [facility]  Result Received: 04/10/22 13:36   Glucose 70 - 110 mg/dL 106   Sodium 136 - 145 mmol/L 142   Potassium 3.6 - 5.1 mmol/L 4.0   Chloride 97 - 109 mmol/L 104   Carbon Dioxide (CO2) 22.0 - 32.0 mmol/L 28.1   Calcium 8.7 - 10.3 mg/dL 9.4   Urea Nitrogen (BUN) 7 - 25 mg/dL 10   Creatinine 0.6 - 1.1 mg/dL 0.8   Glomerular Filtration Rate (eGFR) >60 mL/min/1.73sq m 84   Comment: CKD-EPI (2021) does not include patient's race in the calculation of eGFR.  Monitoring changes of plasma creatinine and eGFR over time is useful for monitoring kidney function.   Interpretive Ranges for eGFR (CKD-EPI 2021):   eGFR:       >60 mL/min/1.73 sq. m - Normal  eGFR:       30-59  mL/min/1.73 sq. m - Moderately Decreased  eGFR:       15-29 mL/min/1.73 sq. m  - Severely Decreased  eGFR:       < 15 mL/min/1.73 sq. m  - Kidney Failure    Note: These eGFR calculations do not apply in acute situations when eGFR is changing rapidly or patients on dialysis.  BUN/Crea Ratio 6.0 - 20.0 12.5   Anion Gap w/K 6.0 - 16.0 13.9   Resulting Agency  [facility]   Specimen Collected: 05/21/22 11:40   Performed by: [place]: 05/21/22 15:00  Received From: [facility]  Result Received: 05/25/22 16:14   Urinalysis ***   I have reviewed the labs.   Pertinent Imaging: ***  Assessment & Plan:  ***  1. Painful urination -UA *** -urine sent for culture  No follow-ups on file.  These notes generated with voice recognition software. I apologize for typographical errors.  [place], [place] [address]  [place], [place] [address] [phone]

## 2022-05-30 ENCOUNTER — Ambulatory Visit: Payer: Medicare Other | Admitting: Urology

## 2022-05-30 DIAGNOSIS — R309 Painful micturition, unspecified: Secondary | ICD-10-CM

## 2022-06-05 ENCOUNTER — Telehealth: Payer: Self-pay | Admitting: Student in an Organized Health Care Education/Training Program

## 2022-06-05 NOTE — Telephone Encounter (Signed)
Patient informed that , per signed Medication Agreement, that no opioids may be filled early and must last 30 days.

## 2022-06-05 NOTE — Telephone Encounter (Signed)
PT stated that her prescription date is tomorrow and wants to know if Dr Holley Raring will allow her to get her medication today. PT stated that she is in pain and is out of medications. Please give patient a call. Thanks

## 2022-06-05 NOTE — Progress Notes (Signed)
06/06/2022 10:14 PM   Kimberly [name] [birth year] [id number]  Referring provider: Dion Body, MD [facility],  [place] [address]  Urological history: 1.  High risk hematuria -Non-smoker -CTU (08/25/2021) - Punctuate nonobstructive calculus of the superior pole of the left kidney -cysto/hydro distention w/ bilateral RTG's (08/2021) - Post dilation glomerulations were noted lower lateral and posterior walls/trigone -contrast CT (03/2022) - Mild urothelial enhancement of right ureter - urine culture positive for E.coli -no reports of gross heme -UA 3-10 RBC's, but urine culture pending  2. Interstitial cystitis -cysto/hydrodistention in February 2023 which noted post dilation glomerulations -Uribel, amitriptyline 25 mg and hydroxyzine 10 mg TID, prn   3.  Nephrolithiasis -CTU (07/2021) - Punctuate nonobstructive calculus of the superior pole of the left kidney  Chief Complaint  Patient presents with   Dysuria    HPI: Kimberly [name] is a 61 y.o. female who presents today for painful urination.  She states she has been having dysuria and vaginal pains off-and-on for the last 3 weeks.  She has noticed a yellow vaginal discharge.  Uribel seems to help.  Patient denies any modifying or aggravating factors.  Patient denies any gross hematuria or suprapubic/flank pain.  Patient denies any fevers, chills, nausea or vomiting.    UA yellow slightly cloudy, trace ketone, specific gravity 1.015, trace blood, pH 7.5, trace protein, 3-10 RBCs, greater than 10 epithelial cells and many bacteria and presence of yeast.  PVR 0 mL   PMH: Past Medical History:  Diagnosis Date   Anxiety    takes alprazolam - rare use    Arthritis    cervical spondylosis    Asthma    Balance problem    Brain cyst    Carpal tunnel syndrome, bilateral    Cervical fusion syndrome    Complete rupture of rotator cuff    Complication of anesthesia    lung  collapsed after neck fusion   Degenerative disc disease, cervical    Depression    Difficult intubation    Small mouth opening, limited neck flexion, very anterior    Flu 08/10/2018   tested positive   GERD (gastroesophageal reflux disease)    pt. reports that its better, no meds in use at this time- 2015   Headache    Herpes    History of kidney stones    History of pneumonia    Hyperlipidemia    Hypertension    pt. doesn't see cardiologist, followed for HTN by Dr. Gerarda Fraction   Hypothyroidism    Inflammation of shoulder joint    Memory difficulties    Neuromuscular disorder ([place])    joint and muscle problems   Occipital neuralgia    Pneumothorax on right    following C4-6 ACDF 07/04/16   Pseudoarthrosis of cervical spine (HCC)    Recurrent falls    Syncope    Urinary frequency     Surgical History: Past Surgical History:  Procedure Laterality Date   ABDOMINAL HYSTERECTOMY     ANTERIOR CERVICAL DECOMP/DISCECTOMY FUSION N/A 08/06/2014   Procedure: ANTERIOR CERVICAL DECOMPRESSION/DISCECTOMY FUSION CERVICAL THREE-FOUR,CERVICAL SIX-SEVEN ,CERVICAL SEVEN-THORACIC ONE;  Surgeon: Floyce Stakes, MD;  Location: [facility];  Service: Neurosurgery;  Laterality: N/A;   ANTERIOR CERVICAL DECOMP/DISCECTOMY FUSION N/A 07/04/2016   Procedure: CERVICAL FOUR-FIVE, CERVICAL FIVE-SIX ANTERIOR CERVICAL DECOMPRESSION/DISCECTOMY/FUSION WITH REVISION OF CERVICAL THREE-FOUR PLATE;  Surgeon: Leeroy Cha, MD;  Location: [facility];  Service: Neurosurgery;  Laterality: N/A;   CARPAL  TUNNEL RELEASE Left 02/16/2013   Procedure: CARPAL TUNNEL RELEASE;  Surgeon: Carole Civil, MD;  Location: [facility];  Service: Orthopedics;  Laterality: Left;   CARPAL TUNNEL RELEASE Right 03/27/2013   Procedure: RIGHT CARPAL TUNNEL RELEASE;  Surgeon: Carole Civil, MD;  Location: [facility];  Service: Orthopedics;  Laterality: Right;   CESAREAN SECTION     x2   CHOLECYSTECTOMY N/A 06/17/2015   Procedure: LAPAROSCOPIC  CHOLECYSTECTOMY;  Surgeon: Aviva Signs, MD;  Location: [facility];  Service: General;  Laterality: N/A;   COLONOSCOPY     COLONOSCOPY WITH PROPOFOL N/A 05/06/2019   Procedure: COLONOSCOPY WITH PROPOFOL;  Surgeon: Robert Bellow, MD;  Location: [facility];  Service: Endoscopy;  Laterality: N/A;   CYSTO WITH HYDRODISTENSION N/A 09/26/2021   Procedure: CYSTOSCOPY/HYDRODISTENSION;  Surgeon: Abbie Sons, MD;  Location: [facility];  Service: Urology;  Laterality: N/A;   CYSTOSCOPY W/ RETROGRADES Bilateral 09/26/2021   Procedure: CYSTOSCOPY WITH RETROGRADE PYELOGRAM;  Surgeon: Abbie Sons, MD;  Location: [facility];  Service: Urology;  Laterality: Bilateral;   CYSTOSCOPY WITH BIOPSY N/A 09/26/2021   Procedure: CYSTOSCOPY WITH BIOPSY;  Surgeon: Abbie Sons, MD;  Location: [facility];  Service: Urology;  Laterality: N/A;   ESOPHAGOGASTRODUODENOSCOPY N/A 05/08/2021   Procedure: ESOPHAGOGASTRODUODENOSCOPY (EGD);  Surgeon: Lesly Rubenstein, MD;  Location: [facility];  Service: Endoscopy;  Laterality: N/A;   ESOPHAGOGASTRODUODENOSCOPY  11/25/2017   EXCISION VAGINAL CYST N/A 12/22/2021   Procedure: EXCISION VULVAR CYST;  Surgeon: Benjaman Kindler, MD;  Location: [facility];  Service: Gynecology;  Laterality: N/A;   EXTRACORPOREAL SHOCK WAVE LITHOTRIPSY Left 09/04/2018   Procedure: EXTRACORPOREAL SHOCK WAVE LITHOTRIPSY (ESWL);  Surgeon: Billey Co, MD;  Location: [facility];  Service: Urology;  Laterality: Left;   FOREIGN BODY REMOVAL Left    knee-as child   KNEE ARTHROSCOPY     NASAL SINUS SURGERY N/A 04/13/2015   Procedure: nasal endoscopy with adenoid biopsy;  Surgeon: Ruby Cola, MD;  Location: [facility];  Service: ENT;  Laterality: N/A;   NECK SURGERY  2016   x 3 all together   POSTERIOR CERVICAL FUSION/FORAMINOTOMY N/A 11/13/2016   Procedure: CERVICAL TWO-CERVICAL SIX POSTERIOR CERVICAL FUSION WITH LATERAL MASS FIXATION;  Surgeon: Kristeen Miss, MD;  Location: [facility];  Service:  Neurosurgery;  Laterality: N/A;  posterior approach   REDUCTION MAMMAPLASTY  10/2019   ROTATOR CUFF REPAIR Left 2014   SHOULDER ACROMIOPLASTY Left 05/18/2015   Procedure: SHOULDER ACROMIOPLASTY;  Surgeon: Earlie Server, MD;  Location: [facility];  Service: Orthopedics;  Laterality: Left;   SHOULDER ARTHROSCOPY WITH DISTAL CLAVICLE RESECTION Left 05/18/2015   Procedure: LEFT SHOULDER ARTHROSCOPY WITH  DISTAL CLAVICLE RESECTION;  Surgeon: Earlie Server, MD;  Location: [facility];  Service: Orthopedics;  Laterality: Left;   URETEROSCOPY Bilateral 09/26/2021   Procedure: URETEROSCOPY;  Surgeon: Abbie Sons, MD;  Location: [facility];  Service: Urology;  Laterality: Bilateral;   XI ROBOTIC ASSISTED OOPHORECTOMY N/A 12/22/2021   Procedure: XI ROBOTIC ASSISTED DIAGNOSTIC LAPAROSCOPY, LEFT OOPHORECTOMY, WITH RIGHT SALPINGECTOMY AND LYSIS OF ADHESIONS;  Surgeon: Benjaman Kindler, MD;  Location: [facility];  Service: Gynecology;  Laterality: N/A;    Home Medications:  Allergies as of 06/06/2022       Reactions   Shellfish Allergy Anaphylaxis, Hives   "Swells throat up"   Peanut-containing Drug Products Hives   Codeine Other (See Comments)   jitters   Diphtheria Toxoid Rash   Tetanus Toxoids Rash  Medication List        Accurate as of June 06, 2022 11:59 PM. If you have any questions, ask your nurse or doctor.          acetaminophen 500 MG tablet Commonly known as: TYLENOL Take 500 mg by mouth 2 (two) times daily as needed for headache.   amitriptyline 25 MG tablet Commonly known as: ELAVIL Take 1 tablet (25 mg total) by mouth at bedtime. What changed:  when to take this reasons to take this   amLODipine 5 MG tablet Commonly known as: NORVASC Take 5 mg by mouth daily.   cholecalciferol 25 MCG (1000 UNIT) tablet Commonly known as: VITAMIN D3 Take 1,000 Units by mouth daily.   estradiol 1 MG tablet Commonly known as: ESTRACE Take  1.5 mg by mouth daily.   fluconazole 150 MG tablet Commonly known as: DIFLUCAN Take 1 tablet (150 mg total) by mouth once for 1 dose. Started by: Zara Council, PA-C   gabapentin 600 MG tablet Commonly known as: Neurontin Take 1 tablet (600 mg total) by mouth 3 (three) times daily.   hydrOXYzine 10 MG tablet Commonly known as: ATARAX Take 1 tablet (10 mg total) by mouth 3 (three) times daily as needed. What changed: reasons to take this   meloxicam 15 MG tablet Commonly known as: MOBIC Take 15 mg by mouth daily as needed for pain.   NONFORMULARY OR COMPOUNDED ITEM Place 10 mg vaginally at bedtime as needed (spasms). Diazepam 10 mg   Oxycodone HCl 10 MG Tabs Take 1 tablet (10 mg total) by mouth every 8 (eight) hours as needed (pain).   Oxycodone HCl 10 MG Tabs Take 1 tablet (10 mg total) by mouth every 8 (eight) hours as needed (pain). Start taking on: July 06, 2022   Oxycodone HCl 10 MG Tabs Take 1 tablet (10 mg total) by mouth every 8 (eight) hours as needed (pain). Start taking on: August 05, 2022   pantoprazole 40 MG tablet Commonly known as: PROTONIX Take 40 mg by mouth every morning.   rizatriptan 10 MG tablet Commonly known as: MAXALT Take 10 mg by mouth as needed for migraine. May repeat in 2 hours if needed   sulfamethoxazole-trimethoprim 800-160 MG tablet Commonly known as: BACTRIM DS Take 1 tablet by mouth every 12 (twelve) hours. Started by: Zara Council, PA-C   Uribel 118 MG Caps TAKE 1 CAPSULE BY MOUTH AS NEEDED   valACYclovir 1000 MG tablet Commonly known as: VALTREX Take 1,000 mg by mouth daily.   VISINE OP Apply 1 drop to eye daily as needed (irritation).        Allergies:  Allergies  Allergen Reactions   Shellfish Allergy Anaphylaxis and Hives    "Swells throat up"   Peanut-Containing Drug Products Hives   Codeine Other (See Comments)    jitters   Diphtheria Toxoid Rash   Tetanus Toxoids Rash    Family History: Family  History  Problem Relation Age of Onset   Cancer Father        prostate cancer   Hypertension Father    Hypertension Other    Breast cancer Neg Hx     Social History:  reports that she has never smoked. She has never used smokeless tobacco. She reports current alcohol use. She reports that she does not use drugs.  ROS: Pertinent ROS in HPI  Physical Exam: BP 136/89   Pulse (!) 103   Ht _0  (1.549 m)   Wt 144 lb (  65.3 kg)   BMI 27.21 kg/m   Constitutional:  Well nourished. Alert and oriented, No acute distress. HEENT: [place] AT, moist mucus membranes.  Trachea midline Cardiovascular: No clubbing, cyanosis, or edema. Respiratory: Normal respiratory effort, no increased work of breathing. Neurologic: Grossly intact, no focal deficits, moving all 4 extremities. Psychiatric: Normal mood and affect.    Laboratory Data: WBC (White Blood Cell Count) 4.1 - 10.2 10^3/uL 10.1   RBC (Red Blood Cell Count) 4.04 - 5.48 10^6/uL 4.12   Hemoglobin 12.0 - 15.0 gm/dL 12.7   Hematocrit 35.0 - 47.0 % 39.5   MCV (Mean Corpuscular Volume) 80.0 - 100.0 fl 95.9   MCH (Mean Corpuscular Hemoglobin) 27.0 - 31.2 pg 30.8   MCHC (Mean Corpuscular Hemoglobin Concentration) 32.0 - 36.0 gm/dL 32.2   Platelet Count 150 - 450 10^3/uL 272   RDW-CV (Red Cell Distribution Width) 11.6 - 14.8 % 11.8   MPV (Mean Platelet Volume) 9.4 - 12.4 fl 9.6   Neutrophils 1.50 - 7.80 10^3/uL 6.47   Lymphocytes 1.00 - 3.60 10^3/uL 2.53   Monocytes 0.00 - 1.50 10^3/uL 0.78   Eosinophils 0.00 - 0.55 10^3/uL 0.23   Basophils 0.00 - 0.09 10^3/uL 0.08   Neutrophil % 32.0 - 70.0 % 64.1   Lymphocyte % 10.0 - 50.0 % 25.0   Monocyte % 4.0 - 13.0 % 7.7   Eosinophil % 1.0 - 5.0 % 2.3   Basophil% 0.0 - 2.0 % 0.8   Immature Granulocyte % <=0.7 % 0.1   Immature Granulocyte Count <=0.06 10^3/L 0.01   Resulting Agency  [facility]   Specimen Collected: 04/06/22 12:41   Performed by: [facility] Last  Resulted: 04/06/22 13:33  Received From: [facility]  Result Received: 04/10/22 13:36   Glucose 70 - 110 mg/dL 82   Sodium 136 - 145 mmol/L 138   Potassium 3.6 - 5.1 mmol/L 3.3 Low    Chloride 97 - 109 mmol/L 99   Carbon Dioxide (CO2) 22.0 - 32.0 mmol/L 31.4   Urea Nitrogen (BUN) 7 - 25 mg/dL 9   Creatinine 0.6 - 1.1 mg/dL 0.9   Glomerular Filtration Rate (eGFR), MDRD Estimate >60 mL/min/1.73sq m 77   Calcium 8.7 - 10.3 mg/dL 9.6   AST  8 - 39 U/L 18   ALT  5 - 38 U/L 15   Alk Phos (alkaline Phosphatase) 34 - 104 U/L 66   Albumin 3.5 - 4.8 g/dL 4.1   Bilirubin, Total 0.3 - 1.2 mg/dL 0.7   Protein, Total 6.1 - 7.9 g/dL 7.4   A/G Ratio 1.0 - 5.0 gm/dL 1.2   Resulting Agency  [facility]   Specimen Collected: 04/06/22 12:41   Performed by: [facility] Last Resulted: 04/06/22 16:45  Received From: [facility]  Result Received: 04/10/22 13:36   Glucose 70 - 110 mg/dL 106   Sodium 136 - 145 mmol/L 142   Potassium 3.6 - 5.1 mmol/L 4.0   Chloride 97 - 109 mmol/L 104   Carbon Dioxide (CO2) 22.0 - 32.0 mmol/L 28.1   Calcium 8.7 - 10.3 mg/dL 9.4   Urea Nitrogen (BUN) 7 - 25 mg/dL 10   Creatinine 0.6 - 1.1 mg/dL 0.8   Glomerular Filtration Rate (eGFR) >60 mL/min/1.73sq m 84   Comment: CKD-EPI (2021) does not include patient's race in the calculation of eGFR.  Monitoring changes of plasma creatinine and eGFR over time is  useful for monitoring kidney function.   Interpretive Ranges for eGFR (CKD-EPI 2021):   eGFR:       >60 mL/min/1.73 sq. m - Normal  eGFR:       30-59 mL/min/1.73 sq. m - Moderately Decreased  eGFR:       15-29 mL/min/1.73 sq. m  - Severely Decreased  eGFR:       < 15 mL/min/1.73 sq. m  - Kidney Failure    Note: These eGFR calculations do not apply in acute situations when eGFR is changing rapidly or patients on dialysis.  BUN/Crea Ratio 6.0 - 20.0 12.5   Anion Gap w/K 6.0 - 16.0 13.9   Resulting  Agency  [facility]   Specimen Collected: 05/21/22 11:40   Performed by: [facility]: 05/21/22 15:00  Received From: [facility]  Result Received: 05/25/22 16:14   Urinalysis Results for orders placed or performed in visit on 06/06/22  Microscopic Examination   Urine  Result Value Ref Range   WBC, UA 0-5 0 - 5 /hpf   RBC, Urine 3-10 (A) 0 - 2 /hpf   Epithelial Cells (non renal) >10 (A) 0 - 10 /hpf   Bacteria, UA Many (A) None seen/Few   Yeast, UA Present (A) None seen  CULTURE, URINE COMPREHENSIVE   Specimen: Urine   UR  Result Value Ref Range   Urine Culture, Comprehensive Preliminary report    Organism ID, Bacteria Comment   Urinalysis, Complete  Result Value Ref Range   Specific Gravity, UA 1.015 1.005 - 1.030   pH, UA 7.5 5.0 - 7.5   Color, UA Yellow Yellow   Appearance Ur Hazy (A) Clear   Leukocytes,UA Negative Negative   Protein,UA Trace (A) Negative/Trace   Glucose, UA Negative Negative   Ketones, UA Trace (A) Negative   RBC, UA Trace (A) Negative   Bilirubin, UA Negative Negative   Urobilinogen, Ur 0.2 0.2 - 1.0 mg/dL   Nitrite, UA Negative Negative   Microscopic Examination See below:   Bladder Scan (Post Void Residual) in office  Result Value Ref Range   Scan Result 70m   I have reviewed the labs.   Pertinent Imaging:  06/06/22 10:19  Scan Result 030m   Assessment & Plan:    1. Painful urination -UA likely contaminated  -urine sent for culture -start Diflucan 150 mg and sulfamethoxazole-trimethoprim -will need a CATH UA and pelvic exam if symptoms persist  2. Microscopic hematuria -UA w/ 3-10 RBC's  -will recheck urine upon return  3. Vaginal yeast infection -will need pelvic exam if symptoms persist  Return in about 1 month (around 07/06/2022) for CATH UA and pelvic exam .  These notes generated with voice recognition software. I apologize for typographical errors.  [id number] Euclid DriveSuNew BedforduCentral HighNC [id number]

## 2022-06-06 ENCOUNTER — Ambulatory Visit (INDEPENDENT_AMBULATORY_CARE_PROVIDER_SITE_OTHER): Payer: Medicare Other | Admitting: Urology

## 2022-06-06 ENCOUNTER — Other Ambulatory Visit: Payer: Self-pay | Admitting: Urology

## 2022-06-06 VITALS — BP 136/89 | HR 103 | Ht 61.0 in | Wt 144.0 lb

## 2022-06-06 DIAGNOSIS — B3731 Acute candidiasis of vulva and vagina: Secondary | ICD-10-CM

## 2022-06-06 DIAGNOSIS — R300 Dysuria: Secondary | ICD-10-CM

## 2022-06-06 DIAGNOSIS — R3129 Other microscopic hematuria: Secondary | ICD-10-CM

## 2022-06-06 LAB — URINALYSIS, COMPLETE
Bilirubin, UA: NEGATIVE
Glucose, UA: NEGATIVE
Leukocytes,UA: NEGATIVE
Nitrite, UA: NEGATIVE
Specific Gravity, UA: 1.015 (ref 1.005–1.030)
Urobilinogen, Ur: 0.2 mg/dL (ref 0.2–1.0)
pH, UA: 7.5 (ref 5.0–7.5)

## 2022-06-06 LAB — MICROSCOPIC EXAMINATION: Epithelial Cells (non renal): >10 /hpf — AB (ref 0–10)

## 2022-06-06 LAB — BLADDER SCAN AMB NON-IMAGING

## 2022-06-06 MED ORDER — FLUCONAZOLE 150 MG PO TABS: 150.0000 mg | ORAL_TABLET | Freq: Once | ORAL | 0 refills | Status: AC

## 2022-06-06 MED ORDER — SULFAMETHOXAZOLE-TRIMETHOPRIM 800-160 MG PO TABS: 1.0000 | ORAL_TABLET | Freq: Two times a day (BID) | ORAL | 0 refills | Status: DC

## 2022-06-06 MED ORDER — URIBEL 118 MG PO CAPS: ORAL_CAPSULE | ORAL | 0 refills | Status: DC

## 2022-06-08 ENCOUNTER — Encounter: Payer: Self-pay | Admitting: Urology

## 2022-06-09 LAB — CULTURE, URINE COMPREHENSIVE

## 2022-06-11 ENCOUNTER — Telehealth: Payer: Self-pay

## 2022-06-11 NOTE — Telephone Encounter (Signed)
Notified pt as advised, confirmed pts Dec f/u, pt expressed understanding.

## 2022-06-11 NOTE — Telephone Encounter (Signed)
-----   Message from Nori Riis, PA-C sent at 06/10/2022  4:50 PM EST ----- Please let Kimberly [name] know that her urine culture is negative and she can stop the Septra.  We need to make sure she returns in December, so we can get another specimen from her and to make sure her symptoms have resolved.

## 2022-06-18 ENCOUNTER — Other Ambulatory Visit
Admission: RE | Admit: 2022-06-18 | Discharge: 2022-06-18 | Disposition: A | Discharge status: Home / Self Care | Payer: Medicare Other | Source: Ambulatory Visit | Attending: Family Medicine | Admitting: Family Medicine

## 2022-06-18 DIAGNOSIS — R0789 Other chest pain: Secondary | ICD-10-CM | POA: Diagnosis present

## 2022-06-18 LAB — D-DIMER, QUANTITATIVE: D-Dimer, Quant: 0.78 ug/mL-FEU — ABNORMAL HIGH (ref 0.00–0.50)

## 2022-06-19 ENCOUNTER — Ambulatory Visit
Admission: RE | Admit: 2022-06-19 | Discharge: 2022-06-19 | Disposition: A | Discharge status: Home / Self Care | Payer: Medicare Other | Source: Ambulatory Visit | Attending: Family Medicine | Admitting: Family Medicine

## 2022-06-19 ENCOUNTER — Other Ambulatory Visit: Payer: Self-pay | Admitting: Family Medicine

## 2022-06-19 DIAGNOSIS — R7989 Other specified abnormal findings of blood chemistry: Secondary | ICD-10-CM | POA: Insufficient documentation

## 2022-06-19 DIAGNOSIS — R0789 Other chest pain: Secondary | ICD-10-CM | POA: Diagnosis present

## 2022-06-19 MED ORDER — IOHEXOL 350 MG/ML SOLN
75.0000 mL | Freq: Once | INTRAVENOUS | Status: AC | PRN
  Administered 2022-06-19: 75 mL via INTRAVENOUS

## 2022-06-26 IMAGING — CR DG ABDOMEN 1V
1 series · 1 of 1 positions shown · non-contrast
Comparison: 03/20/2019.  CT 08/04/2018

CLINICAL DATA: Kidney stone.  Hematuria

EXAM:
ABDOMEN - 1 VIEW

[abdomen kub]
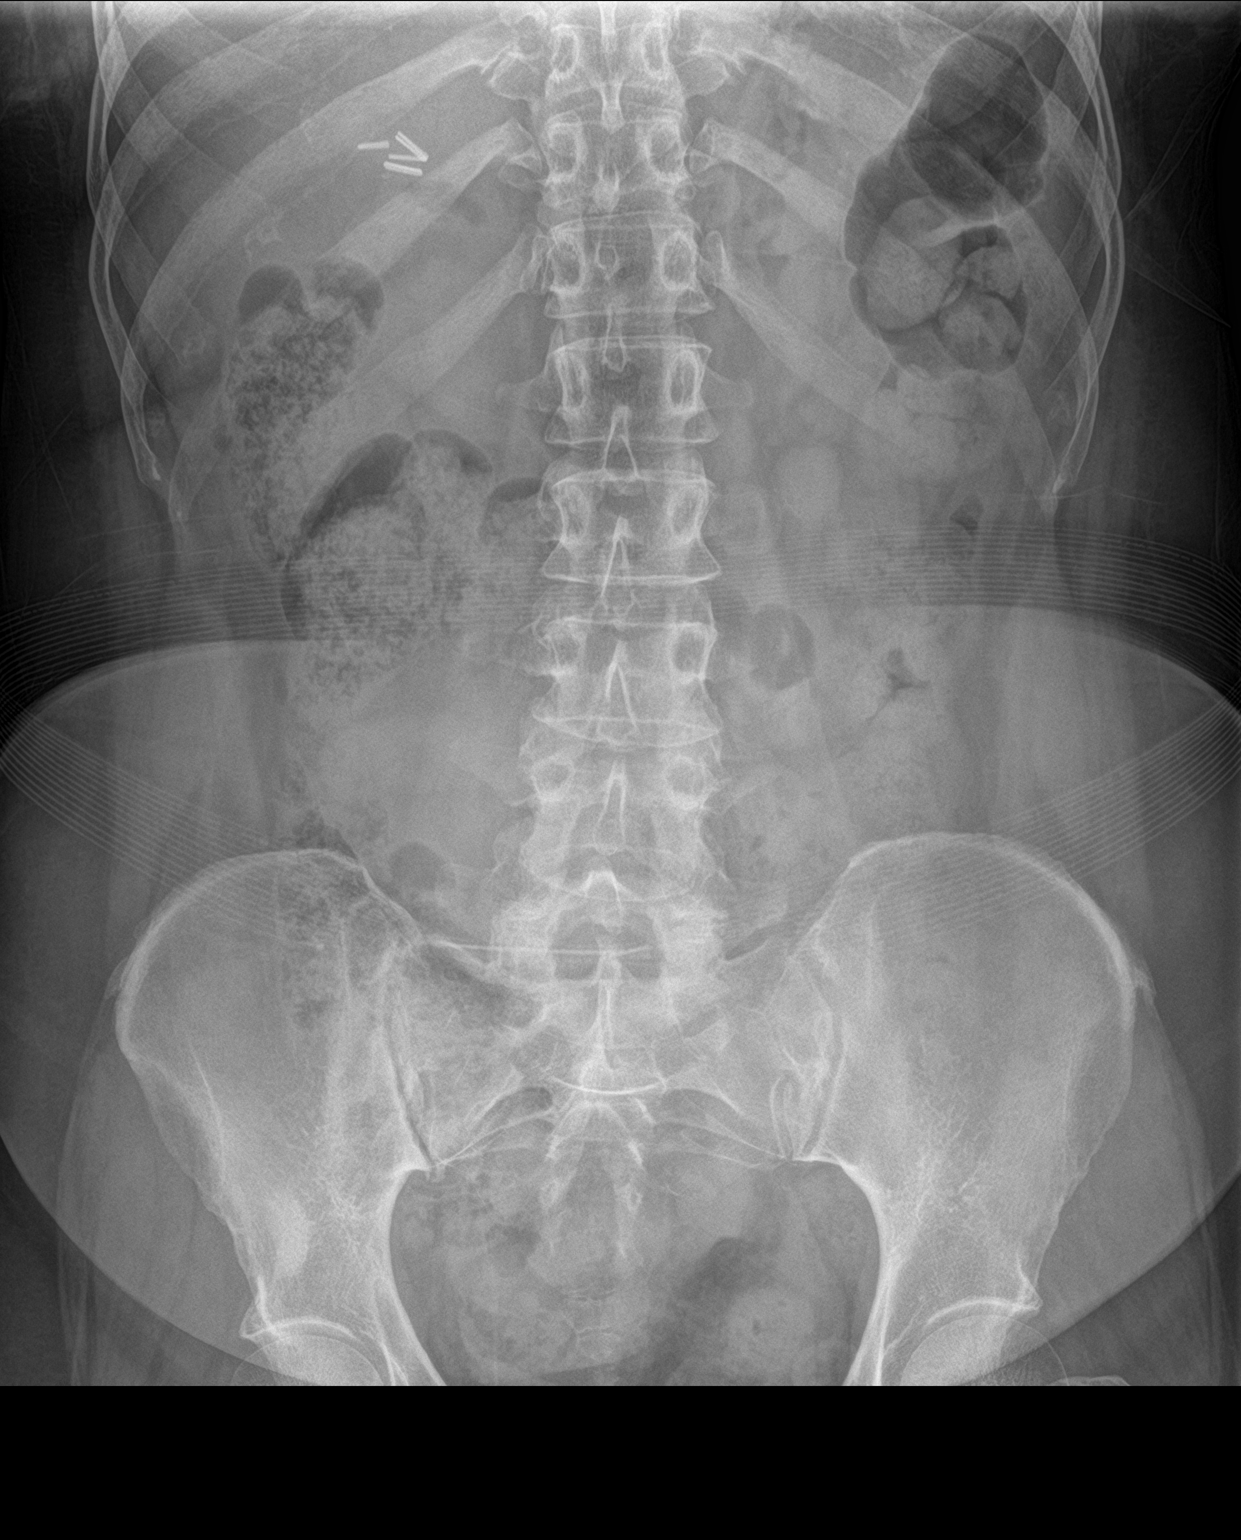

[1 of 1 positions shown; findings below may reference images not displayed]

FINDINGS: Previously seen stone in the lower pole of the left kidney not
definitively seen by plain film. Rounded calcifications in the lower
pelvis likely reflect phleboliths. Prior cholecystectomy.
Nonobstructive bowel gas pattern with moderate stool burden.
IMPRESSION: No suspicious calcifications seen.

Moderate stool burden.

## 2022-07-05 ENCOUNTER — Ambulatory Visit: Payer: Medicare Other | Admitting: Urology

## 2022-07-10 NOTE — Progress Notes (Unsigned)
07/11/2022 9:07 PM   Kimberly [name] 1960-08-24 [id number]  Referring provider: Dion Body, MD [facility] [place],   [id number]  Urological history: 1.  High risk hematuria -Non-smoker -CTU (08/25/2021) - Punctuate nonobstructive calculus of the superior pole of the left kidney -cysto/hydro distention w/ bilateral RTG's (08/2021) - Post dilation glomerulations were noted lower lateral and posterior walls/trigone -contrast CT (03/2022) - Mild urothelial enhancement of right ureter - urine culture positive for E.coli -no reports of gross heme -UA 3-10 RBC's, but urine culture pending  2. Interstitial cystitis -cysto/hydrodistention in February 2023 which noted post dilation glomerulations -Uribel, amitriptyline 25 mg and hydroxyzine 10 mg TID, prn   3.  Nephrolithiasis -CTU (07/2021) - Punctuate nonobstructive calculus of the superior pole of the left kidney  No chief complaint on file.   HPI: Kimberly [name] is a 61 y.o. female who presents today for recheck of symptoms and UA.    At her visit on June 06, 2022, she states she has been having dysuria and vaginal pains off-and-on for the last 3 weeks.  She has noticed a yellow vaginal discharge.  Uribel seems to help.  UA yellow slightly cloudy, trace ketone, specific gravity 1.015, trace blood, pH 7.5, trace protein, 3-10 RBCs, greater than 10 epithelial cells and many bacteria and presence of yeast. PVR 0 mL.  Urine culture mixed urogenital flora.  She was started on Diflucan and Septra.  ***  PMH: Past Medical History:  Diagnosis Date   Anxiety    takes alprazolam - rare use    Arthritis    cervical spondylosis    Asthma    Balance problem    Brain cyst    Carpal tunnel syndrome, bilateral    Cervical fusion syndrome    Complete rupture of rotator cuff    Complication of anesthesia    lung collapsed after neck fusion   Degenerative disc disease, cervical     Depression    Difficult intubation    Small mouth opening, limited neck flexion, very anterior    Flu 08/10/2018   tested positive   GERD (gastroesophageal reflux disease)    pt. reports that its better, no meds in use at this time- 2015   Headache    Herpes    History of kidney stones    History of pneumonia    Hyperlipidemia    Hypertension    pt. doesn't see cardiologist, followed for HTN by Dr. Gerarda Fraction   Hypothyroidism    Inflammation of shoulder joint    Memory difficulties    Neuromuscular disorder ([place])    joint and muscle problems   Occipital neuralgia    Pneumothorax on right    following C4-6 ACDF 07/04/16   Pseudoarthrosis of cervical spine (HCC)    Recurrent falls    Syncope    Urinary frequency     Surgical History: Past Surgical History:  Procedure Laterality Date   ABDOMINAL HYSTERECTOMY     ANTERIOR CERVICAL DECOMP/DISCECTOMY FUSION N/A 08/06/2014   Procedure: ANTERIOR CERVICAL DECOMPRESSION/DISCECTOMY FUSION CERVICAL THREE-FOUR,CERVICAL SIX-SEVEN ,CERVICAL SEVEN-THORACIC ONE;  Surgeon: Floyce Stakes, MD;  Location: [place];  Service: Neurosurgery;  Laterality: N/A;   ANTERIOR CERVICAL DECOMP/DISCECTOMY FUSION N/A 07/04/2016   Procedure: CERVICAL FOUR-FIVE, CERVICAL FIVE-SIX ANTERIOR CERVICAL DECOMPRESSION/DISCECTOMY/FUSION WITH REVISION OF CERVICAL THREE-FOUR PLATE;  Surgeon: Leeroy Cha, MD;  Location: [place];  Service: Neurosurgery;  Laterality: N/A;   CARPAL TUNNEL RELEASE Left 02/16/2013   Procedure:  CARPAL TUNNEL RELEASE;  Surgeon: Carole Civil, MD;  Location: [facility];  Service: Orthopedics;  Laterality: Left;   CARPAL TUNNEL RELEASE Right 03/27/2013   Procedure: RIGHT CARPAL TUNNEL RELEASE;  Surgeon: Carole Civil, MD;  Location: [facility];  Service: Orthopedics;  Laterality: Right;   CESAREAN SECTION     x2   CHOLECYSTECTOMY N/A 06/17/2015   Procedure: LAPAROSCOPIC CHOLECYSTECTOMY;  Surgeon: Aviva Signs, MD;  Location: [facility];  Service:  General;  Laterality: N/A;   COLONOSCOPY     COLONOSCOPY WITH PROPOFOL N/A 05/06/2019   Procedure: COLONOSCOPY WITH PROPOFOL;  Surgeon: Robert Bellow, MD;  Location: [facility];  Service: Endoscopy;  Laterality: N/A;   CYSTO WITH HYDRODISTENSION N/A 09/26/2021   Procedure: CYSTOSCOPY/HYDRODISTENSION;  Surgeon: Abbie Sons, MD;  Location: [facility];  Service: Urology;  Laterality: N/A;   CYSTOSCOPY W/ RETROGRADES Bilateral 09/26/2021   Procedure: CYSTOSCOPY WITH RETROGRADE PYELOGRAM;  Surgeon: Abbie Sons, MD;  Location: [facility];  Service: Urology;  Laterality: Bilateral;   CYSTOSCOPY WITH BIOPSY N/A 09/26/2021   Procedure: CYSTOSCOPY WITH BIOPSY;  Surgeon: Abbie Sons, MD;  Location: [facility];  Service: Urology;  Laterality: N/A;   ESOPHAGOGASTRODUODENOSCOPY N/A 05/08/2021   Procedure: ESOPHAGOGASTRODUODENOSCOPY (EGD);  Surgeon: Lesly Rubenstein, MD;  [facility];  Service: Endoscopy;  Laterality: N/A;   ESOPHAGOGASTRODUODENOSCOPY  11/25/2017   EXCISION VAGINAL CYST N/A 12/22/2021   Procedure: EXCISION VULVAR CYST;  Surgeon: Benjaman Kindler, MD;  Location: [facility];  Service: Gynecology;  Laterality: N/A;   EXTRACORPOREAL SHOCK WAVE LITHOTRIPSY Left 09/04/2018   Procedure: EXTRACORPOREAL SHOCK WAVE LITHOTRIPSY (ESWL);  Surgeon: Billey Co, MD;  Location: [facility];  Service: Urology;  Laterality: Left;   FOREIGN BODY REMOVAL Left    knee-as child   KNEE ARTHROSCOPY     NASAL SINUS SURGERY N/A 04/13/2015   Procedure: nasal endoscopy with adenoid biopsy;  Surgeon: Ruby Cola, MD;  Location: [place];  Service: ENT;  Laterality: N/A;   NECK SURGERY  2016   x 3 all together   POSTERIOR CERVICAL FUSION/FORAMINOTOMY N/A 11/13/2016   Procedure: CERVICAL TWO-CERVICAL SIX POSTERIOR CERVICAL FUSION WITH LATERAL MASS FIXATION;  Surgeon: Kristeen Miss, MD;  Location: [place];  Service: Neurosurgery;  Laterality: N/A;  posterior approach   REDUCTION MAMMAPLASTY   10/2019   ROTATOR CUFF REPAIR Left 2014   SHOULDER ACROMIOPLASTY Left 05/18/2015   Procedure: SHOULDER ACROMIOPLASTY;  Surgeon: Earlie Server, MD;  Location: [place];  Service: Orthopedics;  Laterality: Left;   SHOULDER ARTHROSCOPY WITH DISTAL CLAVICLE RESECTION Left 05/18/2015   Procedure: LEFT SHOULDER ARTHROSCOPY WITH  DISTAL CLAVICLE RESECTION;  Surgeon: Earlie Server, MD;  Location: [place];  Service: Orthopedics;  Laterality: Left;   URETEROSCOPY Bilateral 09/26/2021   Procedure: URETEROSCOPY;  Surgeon: Abbie Sons, MD;  Location: [facility];  Service: Urology;  Laterality: Bilateral;   XI ROBOTIC ASSISTED OOPHORECTOMY N/A 12/22/2021   Procedure: XI ROBOTIC ASSISTED DIAGNOSTIC LAPAROSCOPY, LEFT OOPHORECTOMY, WITH RIGHT SALPINGECTOMY AND LYSIS OF ADHESIONS;  Surgeon: Benjaman Kindler, MD;  Location: [facility];  Service: Gynecology;  Laterality: N/A;    Home Medications:  Allergies as of 07/11/2022       Reactions   Shellfish Allergy Anaphylaxis, Hives   "Swells throat up"   Peanut-containing Drug Products Hives   Codeine Other (See Comments)   jitters   Diphtheria Toxoid Rash   Tetanus Toxoids Rash        Medication List  Accurate as of July 10, 2022  9:07 PM. If you have any questions, ask your nurse or doctor.          acetaminophen 500 MG tablet Commonly known as: TYLENOL Take 500 mg by mouth 2 (two) times daily as needed for headache.   amitriptyline 25 MG tablet Commonly known as: ELAVIL Take 1 tablet (25 mg total) by mouth at bedtime. What changed:  when to take this reasons to take this   amLODipine 5 MG tablet Commonly known as: NORVASC Take 5 mg by mouth daily.   cholecalciferol 25 MCG (1000 UNIT) tablet Commonly known as: VITAMIN D3 Take 1,000 Units by mouth daily.   estradiol 1 MG tablet Commonly known as: ESTRACE Take 1.5 mg by mouth daily.   gabapentin 600 MG tablet Commonly known as:  Neurontin Take 1 tablet (600 mg total) by mouth 3 (three) times daily.   hydrOXYzine 10 MG tablet Commonly known as: ATARAX Take 1 tablet (10 mg total) by mouth 3 (three) times daily as needed. What changed: reasons to take this   meloxicam 15 MG tablet Commonly known as: MOBIC Take 15 mg by mouth daily as needed for pain.   NONFORMULARY OR COMPOUNDED ITEM Place 10 mg vaginally at bedtime as needed (spasms). Diazepam 10 mg   Oxycodone HCl 10 MG Tabs Take 1 tablet (10 mg total) by mouth every 8 (eight) hours as needed (pain).   Oxycodone HCl 10 MG Tabs Take 1 tablet (10 mg total) by mouth every 8 (eight) hours as needed (pain). Start taking on: August 05, 2022   pantoprazole 40 MG tablet Commonly known as: PROTONIX Take 40 mg by mouth every morning.   rizatriptan 10 MG tablet Commonly known as: MAXALT Take 10 mg by mouth as needed for migraine. May repeat in 2 hours if needed   sulfamethoxazole-trimethoprim 800-160 MG tablet Commonly known as: BACTRIM DS Take 1 tablet by mouth every 12 (twelve) hours.   Uribel 118 MG Caps TAKE 1 CAPSULE BY MOUTH AS NEEDED   valACYclovir 1000 MG tablet Commonly known as: VALTREX Take 1,000 mg by mouth daily.   VISINE OP Apply 1 drop to eye daily as needed (irritation).        Allergies:  Allergies  Allergen Reactions   Shellfish Allergy Anaphylaxis and Hives    "Swells throat up"   Peanut-Containing Drug Products Hives   Codeine Other (See Comments)    jitters   Diphtheria Toxoid Rash   Tetanus Toxoids Rash    Family History: Family History  Problem Relation Age of Onset   Cancer Father        prostate cancer   Hypertension Father    Hypertension Other    Breast cancer Neg Hx     Social History:  reports that she has never smoked. She has never used smokeless tobacco. She reports current alcohol use. She reports that she does not use drugs.  ROS: Pertinent ROS in HPI  Physical Exam: There were no vitals taken  for this visit.  Constitutional:  Well nourished. Alert and oriented, No acute distress. HEENT: [place] AT, moist mucus membranes.  Trachea midline, no masses. Cardiovascular: No clubbing, cyanosis, or edema. Respiratory: Normal respiratory effort, no increased work of breathing. GU: No CVA tenderness.  No bladder fullness or masses. Vulvovaginal atrophy w/ pallor, loss of rugae, introital retraction, excoriations.  Vulvar thinning, fusion of labia, clitoral hood retraction, prominent urethral meatus.   *** external genitalia, *** pubic hair distribution, no  lesions.  Normal urethral meatus, no lesions, no prolapse, no discharge.   No urethral masses, tenderness and/or tenderness. No bladder fullness, tenderness or masses. *** vagina mucosa, *** estrogen effect, no discharge, no lesions, *** pelvic support, *** cystocele and *** rectocele noted.  No cervical motion tenderness.  Uterus is freely mobile and non-fixed.  No adnexal/parametria masses or tenderness noted.  Anus and perineum are without rashes or lesions.   ***  Neurologic: Grossly intact, no focal deficits, moving all 4 extremities. Psychiatric: Normal mood and affect.    Laboratory Data: Urinalysis See EPIC and HPI I have reviewed the labs.   Pertinent Imaging:  06/06/22 10:19  Scan Result 82m    Assessment & Plan:    1. Painful urination -UA likely contaminated  -urine sent for culture -start Diflucan 150 mg and sulfamethoxazole-trimethoprim -will need a CATH UA and pelvic exam if symptoms persist  2. Microscopic hematuria -UA w/ 3-10 RBC's  -will recheck urine upon return  3. Vaginal yeast infection -will need pelvic exam if symptoms persist  No follow-ups on file.  These notes generated with voice recognition software. I apologize for typographical errors.  [place] [address] [place] [phone]

## 2022-07-11 ENCOUNTER — Ambulatory Visit (INDEPENDENT_AMBULATORY_CARE_PROVIDER_SITE_OTHER): Payer: Medicare Other | Admitting: Urology

## 2022-07-11 ENCOUNTER — Telehealth: Payer: Self-pay | Admitting: Student in an Organized Health Care Education/Training Program

## 2022-07-11 ENCOUNTER — Encounter: Payer: Self-pay | Admitting: Urology

## 2022-07-11 VITALS — BP 113/81 | HR 98 | Ht 61.0 in | Wt 144.0 lb

## 2022-07-11 DIAGNOSIS — R39198 Other difficulties with micturition: Secondary | ICD-10-CM | POA: Diagnosis not present

## 2022-07-11 DIAGNOSIS — R3129 Other microscopic hematuria: Secondary | ICD-10-CM

## 2022-07-11 DIAGNOSIS — B3731 Acute candidiasis of vulva and vagina: Secondary | ICD-10-CM

## 2022-07-11 DIAGNOSIS — N3020 Other chronic cystitis without hematuria: Secondary | ICD-10-CM

## 2022-07-11 LAB — URINALYSIS, COMPLETE
Bilirubin, UA: NEGATIVE
Glucose, UA: NEGATIVE
Leukocytes,UA: NEGATIVE
Nitrite, UA: NEGATIVE
Specific Gravity, UA: 1.02 (ref 1.005–1.030)
Urobilinogen, Ur: 0.2 mg/dL (ref 0.2–1.0)
pH, UA: 6 (ref 5.0–7.5)

## 2022-07-11 LAB — MICROSCOPIC EXAMINATION: Epithelial Cells (non renal): >10 /hpf — AB (ref 0–10)

## 2022-07-11 MED ORDER — AMITRIPTYLINE HCL 25 MG PO TABS: 25.0000 mg | ORAL_TABLET | Freq: Every day | ORAL | 1 refills | Status: DC

## 2022-07-11 MED ORDER — HYDROXYZINE HCL 10 MG PO TABS: 10.0000 mg | ORAL_TABLET | Freq: Three times a day (TID) | ORAL | 5 refills | Status: AC | PRN

## 2022-07-11 NOTE — Telephone Encounter (Signed)
Pateint states when she got her medications [place], they gave her a pink pill and she states it doesn't work at all. States only the white pills work. She wants script sent in to [facility] or somewhere. She doesn't want generic pain medications they do not work for her  I explained Dr. Holley Raring is out of the office until Monday

## 2022-07-12 ENCOUNTER — Telehealth: Payer: Self-pay | Admitting: Student in an Organized Health Care Education/Training Program

## 2022-07-12 NOTE — Telephone Encounter (Signed)
Patient will call other pharmacies to inquire who may have "white" Oxycodone. I informed her that Dr. Holley Raring likely will not send a new prescription, but she should not discard the "pink" ones.

## 2022-07-12 NOTE — Telephone Encounter (Signed)
PT said that [facility] has the '5mg'$  of oxycodone in. PT wanted to see when Dr. Holley Raring comes back will he okay for her to change the dosage from '10mg'$  to '5mg'$ . PT stated that she doesn't remember which nurse she spoke with, will like an return call back. Thanks

## 2022-07-17 ENCOUNTER — Telehealth: Payer: Self-pay | Admitting: Student in an Organized Health Care Education/Training Program

## 2022-07-17 ENCOUNTER — Other Ambulatory Visit: Payer: Self-pay | Admitting: Urology

## 2022-07-17 ENCOUNTER — Telehealth: Payer: Self-pay | Admitting: Urology

## 2022-07-17 ENCOUNTER — Encounter: Payer: Self-pay | Admitting: *Deleted

## 2022-07-17 DIAGNOSIS — R300 Dysuria: Secondary | ICD-10-CM

## 2022-07-17 NOTE — Telephone Encounter (Signed)
Patient reminded that per our previous conversation, I told her that Dr. Holley Raring would not write a new prescription and she planned to find another pharmacy that carries the pills she is requesting. She said she did find a pharmacy, will call her current pharmacy and ask them to transfer.

## 2022-07-17 NOTE — Telephone Encounter (Signed)
Pt called and she stopped amitriptlin 25 mg and hydroxyzine 10 mg.  It makes her tongue burn after drinking and eating.  Pt requests to be put back on Uribel.  Pt needs a new RX for Uribel. Pharmacy [place] Please advise [phone].

## 2022-07-17 NOTE — Telephone Encounter (Signed)
PT stated that she hadn't hear anything back from Dr Holley Raring about her oxycodone prescription. PT stated that the white pills work better , pink pill isn't work. PT wants to know if doctor are going to change her prescription. Please give patient a call.Thanks

## 2022-07-18 ENCOUNTER — Other Ambulatory Visit: Payer: Self-pay | Admitting: Urology

## 2022-07-18 ENCOUNTER — Other Ambulatory Visit: Payer: Self-pay | Admitting: *Deleted

## 2022-07-18 ENCOUNTER — Telehealth: Payer: Self-pay | Admitting: Student in an Organized Health Care Education/Training Program

## 2022-07-18 DIAGNOSIS — R300 Dysuria: Secondary | ICD-10-CM

## 2022-07-18 MED ORDER — URIBEL 118 MG PO CAPS: ORAL_CAPSULE | ORAL | 0 refills | Status: DC

## 2022-07-18 NOTE — Telephone Encounter (Addendum)
If she is going to take the Uribel, she needs to discontinue the Elavil (amitriptyline). Per shannon   Notified patient to stop amitripyline if she is going to take Uribel. Patient understands and is going to stop anitriptyline,

## 2022-07-18 NOTE — Telephone Encounter (Signed)
I have already discussed this with her. No further discussion needed.

## 2022-07-18 NOTE — Telephone Encounter (Signed)
Patient called asking if Dr Holley Raring was going to write new script for non generic Oxycodone, the white pills. She is only taking one of the pink pills a day. Doesn't understand why Dr. Holley Raring wont send in a new script. Says she will bring in the pills for [place] to throw away. They are not helping her pain. Says [facility] has the white pills. I explained she could get them transferred her next script. She says that will not help her now. She is hurting.

## 2022-07-19 ENCOUNTER — Telehealth: Payer: Self-pay | Admitting: *Deleted

## 2022-07-19 NOTE — Telephone Encounter (Signed)
If she is going to take the Uribel, she needs to discontinue the Elavil (amitriptyline).    If she is going to take the Uribel, she needs to discontinue the Elavil (amitriptyline). Per shannon   Notified patient to stop amitripyline if she is going to take Uribel. Patient understands and is going to stop anitriptyline,

## 2022-08-01 ENCOUNTER — Telehealth: Payer: Self-pay | Admitting: Urology

## 2022-08-01 DIAGNOSIS — R300 Dysuria: Secondary | ICD-10-CM

## 2022-08-01 NOTE — Telephone Encounter (Signed)
Pt is out of Uribel (URO-MP) 118 mg. [facility] [address]  Please advise  [phone]  Thank you

## 2022-08-01 NOTE — Telephone Encounter (Signed)
Rx sent 

## 2022-08-02 ENCOUNTER — Other Ambulatory Visit: Payer: Self-pay | Admitting: Plastic Surgery

## 2022-08-02 DIAGNOSIS — N644 Mastodynia: Secondary | ICD-10-CM

## 2022-08-13 ENCOUNTER — Other Ambulatory Visit: Payer: Self-pay | Admitting: Plastic Surgery

## 2022-08-13 DIAGNOSIS — N644 Mastodynia: Secondary | ICD-10-CM

## 2022-08-14 ENCOUNTER — Other Ambulatory Visit: Payer: Self-pay | Admitting: Urology

## 2022-08-14 DIAGNOSIS — R300 Dysuria: Secondary | ICD-10-CM

## 2022-08-14 NOTE — Telephone Encounter (Signed)
Pt calling again, she is out of medication.  Needs another refill of Uribel(URO-MP) [place]

## 2022-08-20 ENCOUNTER — Other Ambulatory Visit: Payer: Self-pay | Admitting: Physician Assistant

## 2022-08-20 ENCOUNTER — Ambulatory Visit
Admission: RE | Admit: 2022-08-20 | Discharge: 2022-08-20 | Disposition: A | Discharge status: Home / Self Care | Payer: 59 | Source: Ambulatory Visit | Attending: Physician Assistant | Admitting: Physician Assistant

## 2022-08-20 DIAGNOSIS — S0990XD Unspecified injury of head, subsequent encounter: Secondary | ICD-10-CM

## 2022-08-20 DIAGNOSIS — R42 Dizziness and giddiness: Secondary | ICD-10-CM

## 2022-08-20 DIAGNOSIS — R55 Syncope and collapse: Secondary | ICD-10-CM

## 2022-08-21 NOTE — Progress Notes (Deleted)
07/11/2022 1:53 PM   Kimberly [name] 05-26-1961 [id number]  Referring provider: Dion Body, MD [facility] [place],  [place] [address]  Urological history: 1.  High risk hematuria -Non-smoker -CTU (08/25/2021) - Punctuate nonobstructive calculus of the superior pole of the left kidney -cysto/hydro distention w/ bilateral RTG's (08/2021) - Post dilation glomerulations were noted lower lateral and posterior walls/trigone -contrast CT (03/2022) - Mild urothelial enhancement of right ureter - urine culture positive for E.coli -no reports of gross heme -UA ***  2. Interstitial cystitis -cysto/hydrodistention in February 2023 which noted post dilation glomerulations -Uribel, amitriptyline 25 mg and hydroxyzine 10 mg TID, prn   3.  Nephrolithiasis -CTU (07/2021) - Punctuate nonobstructive calculus of the superior pole of the left kidney  Chief Complaint  Patient presents with   Hematuria   Follow-up    HPI: Kimberly [name] is a 62 y.o. female who presents today for 6 weeks follow up.    At her visit on June 06, 2022, she states she has been having dysuria and vaginal pains off-and-on for the last 3 weeks.  She has noticed a yellow vaginal discharge.  Uribel seems to help.  UA yellow slightly cloudy, trace ketone, specific gravity 1.015, trace blood, pH 7.5, trace protein, 3-10 RBCs, greater than 10 epithelial cells and many bacteria and presence of yeast.  PVR 0 mL.  Urine culture mixed urogenital flora.  She was started on Diflucan and Septra.  At her visit on July 11, 2022, she continues to have vaginal pain.  She states that it is not relieved by urination.  She denies any vaginal discharge or blood on the toilet paper when she wipes.  She also denies any issues with constipation, diarrhea or bloody stool.  She has not been taking her amitriptyline or hydroxyzine.  UA green cloudy, 1+ ketone, trace blood, pH 6.0, 2+ protein, 0-5 WBCs,  3-10 RBCs, greater than 10 epithelial cells, granular casts present, hyaline cast present and many bacteria.  We restarted her amitriptyline hydroxylate at that appointment.  She called back a few days later stating that she discontinued her amitriptyline and hydroxyzine because it was making her tongue burn after drinking and eating.  She went back on the Uribel.   PMH: Past Medical History:  Diagnosis Date   Anxiety    takes alprazolam - rare use    Arthritis    cervical spondylosis    Asthma    Balance problem    Brain cyst    Carpal tunnel syndrome, bilateral    Cervical fusion syndrome    Complete rupture of rotator cuff    Complication of anesthesia    lung collapsed after neck fusion   Degenerative disc disease, cervical    Depression    Difficult intubation    Small mouth opening, limited neck flexion, very anterior    Flu 08/10/2018   tested positive   GERD (gastroesophageal reflux disease)    pt. reports that its better, no meds in use at this time- 2015   Headache    Herpes    History of kidney stones    History of pneumonia    Hyperlipidemia    Hypertension    pt. doesn't see cardiologist, followed for HTN by Dr. Gerarda Fraction   Hypothyroidism    Inflammation of shoulder joint    Memory difficulties    Neuromuscular disorder ([place])    joint and muscle problems   Occipital neuralgia  Pneumothorax on right    following C4-6 ACDF 07/04/16   Pseudoarthrosis of cervical spine (HCC)    Recurrent falls    Syncope    Urinary frequency     Surgical History: Past Surgical History:  Procedure Laterality Date   ABDOMINAL HYSTERECTOMY     ANTERIOR CERVICAL DECOMP/DISCECTOMY FUSION N/A 08/06/2014   Procedure: ANTERIOR CERVICAL DECOMPRESSION/DISCECTOMY FUSION CERVICAL THREE-FOUR,CERVICAL SIX-SEVEN ,CERVICAL SEVEN-THORACIC ONE;  Surgeon: Floyce Stakes, MD;  Location: [place];  Service: Neurosurgery;  Laterality: N/A;   ANTERIOR CERVICAL DECOMP/DISCECTOMY FUSION N/A  07/04/2016   Procedure: CERVICAL FOUR-FIVE, CERVICAL FIVE-SIX ANTERIOR CERVICAL DECOMPRESSION/DISCECTOMY/FUSION WITH REVISION OF CERVICAL THREE-FOUR PLATE;  Surgeon: Leeroy Cha, MD;  Location: [place];  Service: Neurosurgery;  Laterality: N/A;   CARPAL TUNNEL RELEASE Left 02/16/2013   Procedure: CARPAL TUNNEL RELEASE;  Surgeon: Carole Civil, MD;  [facility];  Service: Orthopedics;  Laterality: Left;   CARPAL TUNNEL RELEASE Right 03/27/2013   Procedure: RIGHT CARPAL TUNNEL RELEASE;  Surgeon: Carole Civil, MD;  [facility];  Service: Orthopedics;  Laterality: Right;   CESAREAN SECTION     x2   CHOLECYSTECTOMY N/A 06/17/2015   Procedure: LAPAROSCOPIC CHOLECYSTECTOMY;  Surgeon: Aviva Signs, MD;  [facility];  Service: General;  Laterality: N/A;   COLONOSCOPY     COLONOSCOPY WITH PROPOFOL N/A 05/06/2019   Procedure: COLONOSCOPY WITH PROPOFOL;  Surgeon: Robert Bellow, MD;  Location: [facility];  Service: Endoscopy;  Laterality: N/A;   CYSTO WITH HYDRODISTENSION N/A 09/26/2021   Procedure: CYSTOSCOPY/HYDRODISTENSION;  Surgeon: Abbie Sons, MD;  Location: [facility];  Service: Urology;  Laterality: N/A;   CYSTOSCOPY W/ RETROGRADES Bilateral 09/26/2021   Procedure: CYSTOSCOPY WITH RETROGRADE PYELOGRAM;  Surgeon: Abbie Sons, MD;  Location: [facility];  Service: Urology;  Laterality: Bilateral;   CYSTOSCOPY WITH BIOPSY N/A 09/26/2021   Procedure: CYSTOSCOPY WITH BIOPSY;  Surgeon: Abbie Sons, MD;  Location: [facility];  Service: Urology;  Laterality: N/A;   ESOPHAGOGASTRODUODENOSCOPY N/A 05/08/2021   Procedure: ESOPHAGOGASTRODUODENOSCOPY (EGD);  Surgeon: Lesly Rubenstein, MD;  Location[facility];  Service: Endoscopy;  Laterality: N/A;   ESOPHAGOGASTRODUODENOSCOPY  11/25/2017   EXCISION VAGINAL CYST N/A 12/22/2021   Procedure: EXCISION VULVAR CYST;  Surgeon: Benjaman Kindler, MD;  Location: [facility];  Service: Gynecology;  Laterality: N/A;    EXTRACORPOREAL SHOCK WAVE LITHOTRIPSY Left 09/04/2018   Procedure: EXTRACORPOREAL SHOCK WAVE LITHOTRIPSY (ESWL);  Surgeon: Billey Co, MD;  Location: [facility];  Service: Urology;  Laterality: Left;   FOREIGN BODY REMOVAL Left    knee-as child   KNEE ARTHROSCOPY     NASAL SINUS SURGERY N/A 04/13/2015   Procedure: nasal endoscopy with adenoid biopsy;  Surgeon: Ruby Cola, MD;  Location: [place];  Service: ENT;  Laterality: N/A;   NECK SURGERY  2016   x 3 all together   POSTERIOR CERVICAL FUSION/FORAMINOTOMY N/A 11/13/2016   Procedure: CERVICAL TWO-CERVICAL SIX POSTERIOR CERVICAL FUSION WITH LATERAL MASS FIXATION;  Surgeon: Kristeen Miss, MD;  Location: [place];  Service: Neurosurgery;  Laterality: N/A;  posterior approach   REDUCTION MAMMAPLASTY  10/2019   ROTATOR CUFF REPAIR Left 2014   SHOULDER ACROMIOPLASTY Left 05/18/2015   Procedure: SHOULDER ACROMIOPLASTY;  Surgeon: Earlie Server, MD;  Location: [place];  Service: Orthopedics;  Laterality: Left;   SHOULDER ARTHROSCOPY WITH DISTAL CLAVICLE RESECTION Left 05/18/2015   Procedure: LEFT SHOULDER ARTHROSCOPY WITH  DISTAL CLAVICLE RESECTION;  Surgeon: Earlie Server, MD;  [facility];  Service: Orthopedics;  Laterality: Left;   URETEROSCOPY Bilateral 09/26/2021   Procedure: URETEROSCOPY;  Surgeon: Abbie Sons, MD;  Location: [facility];  Service: Urology;  Laterality: Bilateral;   XI ROBOTIC ASSISTED OOPHORECTOMY N/A 12/22/2021   Procedure: XI ROBOTIC ASSISTED DIAGNOSTIC LAPAROSCOPY, LEFT OOPHORECTOMY, WITH RIGHT SALPINGECTOMY AND LYSIS OF ADHESIONS;  Surgeon: Benjaman Kindler, MD;  Location: [facility];  Service: Gynecology;  Laterality: N/A;    Home Medications:  Allergies as of 07/11/2022       Reactions   Shellfish Allergy Anaphylaxis, Hives   "Swells throat up"   Peanut-containing Drug Products Hives   Codeine Other (See Comments)   jitters   Diphtheria Toxoid Rash   Tetanus Toxoids  Rash        Medication List        Accurate as of July 11, 2022  1:53 PM. If you have any questions, ask your nurse or doctor.          STOP taking these medications    sulfamethoxazole-trimethoprim 800-160 MG tablet Commonly known as: BACTRIM DS       TAKE these medications    acetaminophen 500 MG tablet Commonly known as: TYLENOL Take 500 mg by mouth 2 (two) times daily as needed for headache.   amitriptyline 25 MG tablet Commonly known as: ELAVIL Take 1 tablet (25 mg total) by mouth at bedtime. What changed:  when to take this reasons to take this   amLODipine 5 MG tablet Commonly known as: NORVASC Take 5 mg by mouth daily.   cholecalciferol 25 MCG (1000 UNIT) tablet Commonly known as: VITAMIN D3 Take 1,000 Units by mouth daily.   estradiol 1 MG tablet Commonly known as: ESTRACE Take 1.5 mg by mouth daily.   gabapentin 600 MG tablet Commonly known as: Neurontin Take 1 tablet (600 mg total) by mouth 3 (three) times daily.   hydrOXYzine 10 MG tablet Commonly known as: ATARAX Take 1 tablet (10 mg total) by mouth 3 (three) times daily as needed. What changed: reasons to take this   meloxicam 15 MG tablet Commonly known as: MOBIC Take 15 mg by mouth daily as needed for pain.   NONFORMULARY OR COMPOUNDED ITEM Place 10 mg vaginally at bedtime as needed (spasms). Diazepam 10 mg   Oxycodone HCl 10 MG Tabs Take 1 tablet (10 mg total) by mouth every 8 (eight) hours as needed (pain).   Oxycodone HCl 10 MG Tabs Take 1 tablet (10 mg total) by mouth every 8 (eight) hours as needed (pain). Start taking on: August 05, 2022   pantoprazole 40 MG tablet Commonly known as: PROTONIX Take 40 mg by mouth every morning.   rizatriptan 10 MG tablet Commonly known as: MAXALT Take 10 mg by mouth as needed for migraine. May repeat in 2 hours if needed   Uribel 118 MG Caps TAKE 1 CAPSULE BY MOUTH AS NEEDED   valACYclovir 1000 MG tablet Commonly known as:  VALTREX Take 1,000 mg by mouth daily.   VISINE OP Apply 1 drop to eye daily as needed (irritation).        Allergies:  Allergies  Allergen Reactions   Shellfish Allergy Anaphylaxis and Hives    "Swells throat up"   Peanut-Containing Drug Products Hives   Codeine Other (See Comments)    jitters   Diphtheria Toxoid Rash   Tetanus Toxoids Rash    Family History: Family History  Problem Relation Age of Onset   Cancer Father  prostate cancer   Hypertension Father    Hypertension Other    Breast cancer Neg Hx     Social History:  reports that she has never smoked. She has never used smokeless tobacco. She reports current alcohol use. She reports that she does not use drugs.  ROS: Pertinent ROS in HPI  Physical Exam: BP 113/81   Pulse 98   Ht '5\' 1"'$  (1.549 m)   Wt 144 lb (65.3 kg)   BMI 27.21 kg/m   Constitutional:  Well nourished. Alert and oriented, No acute distress. HEENT: [place] AT, moist mucus membranes.  Trachea midline, no masses. Cardiovascular: No clubbing, cyanosis, or edema. Respiratory: Normal respiratory effort, no increased work of breathing. GU: No CVA tenderness.  No bladder fullness or masses. Vulvovaginal atrophy w/ pallor, loss of rugae, introital retraction, excoriations.  Vulvar thinning, fusion of labia, clitoral hood retraction, prominent urethral meatus.   *** external genitalia, *** pubic hair distribution, no lesions.  Normal urethral meatus, no lesions, no prolapse, no discharge.   No urethral masses, tenderness and/or tenderness. No bladder fullness, tenderness or masses. *** vagina mucosa, *** estrogen effect, no discharge, no lesions, *** pelvic support, *** cystocele and *** rectocele noted.  No cervical motion tenderness.  Uterus is freely mobile and non-fixed.  No adnexal/parametria masses or tenderness noted.  Anus and perineum are without rashes or lesions.   ***  Neurologic: Grossly intact, no focal deficits, moving all 4  extremities. Psychiatric: Normal mood and affect.    Laboratory Data: Serum creatinine (07/2022) 1.2 Urinalysis See EPIC and HPI I have reviewed the labs.   Pertinent Imaging: N/A   Assessment & Plan:    1. Painful urination -Likely secondary to IC -UA likely contaminated  -Discussed rescue solution treatments to help control her pain, but she deferred  -Will have her restart the amitriptyline and hydroxyzine at this time -Uribel has become unaffordable so we will discontinue that  2. Microscopic hematuria -UA w/ 3-10 RBC's  -Will recheck UA with a catheterized specimen when she returns in 6 weeks   Return in about 6 weeks (around 08/22/2022) for CATH UA, OAB and PVR .  These notes generated with voice recognition software. I apologize for typographical errors.  [place], [place] [address]  [place], [place] [address] [phone]

## 2022-08-22 ENCOUNTER — Ambulatory Visit: Payer: Medicare Other | Admitting: Urology

## 2022-08-22 DIAGNOSIS — R300 Dysuria: Secondary | ICD-10-CM

## 2022-08-22 DIAGNOSIS — R3129 Other microscopic hematuria: Secondary | ICD-10-CM

## 2022-08-26 NOTE — Progress Notes (Deleted)
08/27/2022 2:43 PM   Kimberly [name] 10/05/1960 [id number]  Referring provider: Dion Body, MD [facility] [place],  [place] [id number]  Urological history: 1.  High risk hematuria -Non-smoker -CTU (08/25/2021) - Punctuate nonobstructive calculus of the superior pole of the left kidney -cysto/hydro distention w/ bilateral RTG's (08/2021) - Post dilation glomerulations were noted lower lateral and posterior walls/trigone -contrast CT (03/2022) - Mild urothelial enhancement of right ureter - urine culture positive for E.coli -no reports of gross heme -UA ***  2. Interstitial cystitis -cysto/hydrodistention in February 2023 which noted post dilation glomerulations -Uribel, amitriptyline 25 mg and hydroxyzine 10 mg TID, prn   3.  Nephrolithiasis -CTU (07/2021) - Punctuate nonobstructive calculus of the superior pole of the left kidney  No chief complaint on file.   HPI: Kimberly [name] is a 62 y.o. female who presents today for 6 weeks follow up.    At her visit on June 06, 2022, she states she has been having dysuria and vaginal pains off-and-on for the last 3 weeks.  She has noticed a yellow vaginal discharge.  Uribel seems to help.  UA yellow slightly cloudy, trace ketone, specific gravity 1.015, trace blood, pH 7.5, trace protein, 3-10 RBCs, greater than 10 epithelial cells and many bacteria and presence of yeast.  PVR 0 mL.  Urine culture mixed urogenital flora.  She was started on Diflucan and Septra.  At her visit on July 11, 2022, she continues to have vaginal pain.  She states that it is not relieved by urination.  She denies any vaginal discharge or blood on the toilet paper when she wipes.  She also denies any issues with constipation, diarrhea or bloody stool.  She has not been taking her amitriptyline or hydroxyzine.  UA green cloudy, 1+ ketone, trace blood, pH 6.0, 2+ protein, 0-5 WBCs, 3-10 RBCs, greater than 10 epithelial  cells, granular casts present, hyaline cast present and many bacteria.  We restarted her amitriptyline hydroxylate at that appointment.  She called back a few days later stating that she discontinued her amitriptyline and hydroxyzine because it was making her tongue burn after drinking and eating.  She went back on the Uribel.   PMH: Past Medical History:  Diagnosis Date   Anxiety    takes alprazolam - rare use    Arthritis    cervical spondylosis    Asthma    Balance problem    Brain cyst    Carpal tunnel syndrome, bilateral    Cervical fusion syndrome    Complete rupture of rotator cuff    Complication of anesthesia    lung collapsed after neck fusion   Degenerative disc disease, cervical    Depression    Difficult intubation    Small mouth opening, limited neck flexion, very anterior    Flu 08/10/2018   tested positive   GERD (gastroesophageal reflux disease)    pt. reports that its better, no meds in use at this time- 2015   Headache    Herpes    History of kidney stones    History of pneumonia    Hyperlipidemia    Hypertension    pt. doesn't see cardiologist, followed for HTN by Dr. Gerarda Fraction   Hypothyroidism    Inflammation of shoulder joint    Memory difficulties    Neuromuscular disorder ([place])    joint and muscle problems   Occipital neuralgia    Pneumothorax on right    following C4-6  ACDF 07/04/16   Pseudoarthrosis of cervical spine (HCC)    Recurrent falls    Syncope    Urinary frequency     Surgical History: Past Surgical History:  Procedure Laterality Date   ABDOMINAL HYSTERECTOMY     ANTERIOR CERVICAL DECOMP/DISCECTOMY FUSION N/A 08/06/2014   Procedure: ANTERIOR CERVICAL DECOMPRESSION/DISCECTOMY FUSION CERVICAL THREE-FOUR,CERVICAL SIX-SEVEN ,CERVICAL SEVEN-THORACIC ONE;  Surgeon: Floyce Stakes, MD;  Location: [place];  Service: Neurosurgery;  Laterality: N/A;   ANTERIOR CERVICAL DECOMP/DISCECTOMY FUSION N/A 07/04/2016   Procedure: CERVICAL FOUR-FIVE,  CERVICAL FIVE-SIX ANTERIOR CERVICAL DECOMPRESSION/DISCECTOMY/FUSION WITH REVISION OF CERVICAL THREE-FOUR PLATE;  Surgeon: Leeroy Cha, MD;  Location: [place];  Service: Neurosurgery;  Laterality: N/A;   CARPAL TUNNEL RELEASE Left 02/16/2013   Procedure: CARPAL TUNNEL RELEASE;  Surgeon: Carole Civil, MD;  [facility];  Service: Orthopedics;  Laterality: Left;   CARPAL TUNNEL RELEASE Right 03/27/2013   Procedure: RIGHT CARPAL TUNNEL RELEASE;  Surgeon: Carole Civil, MD;  [facility];  Service: Orthopedics;  Laterality: Right;   CESAREAN SECTION     x2   CHOLECYSTECTOMY N/A 06/17/2015   Procedure: LAPAROSCOPIC CHOLECYSTECTOMY;  Surgeon: Aviva Signs, MD;  [facility];  Service: General;  Laterality: N/A;   COLONOSCOPY     COLONOSCOPY WITH PROPOFOL N/A 05/06/2019   Procedure: COLONOSCOPY WITH PROPOFOL;  Surgeon: Robert Bellow, MD;  Location: [facility];  Service: Endoscopy;  Laterality: N/A;   CYSTO WITH HYDRODISTENSION N/A 09/26/2021   Procedure: CYSTOSCOPY/HYDRODISTENSION;  Surgeon: Abbie Sons, MD;  Location: [facility];  Service: Urology;  Laterality: N/A;   CYSTOSCOPY W/ RETROGRADES Bilateral 09/26/2021   Procedure: CYSTOSCOPY WITH RETROGRADE PYELOGRAM;  Surgeon: Abbie Sons, MD;  Location: [facility];  Service: Urology;  Laterality: Bilateral;   CYSTOSCOPY WITH BIOPSY N/A 09/26/2021   Procedure: CYSTOSCOPY WITH BIOPSY;  Surgeon: Abbie Sons, MD;  Location: [facility];  Service: Urology;  Laterality: N/A;   ESOPHAGOGASTRODUODENOSCOPY N/A 05/08/2021   Procedure: ESOPHAGOGASTRODUODENOSCOPY (EGD);  Surgeon: Lesly Rubenstein, MD;  Location: [facility];  Service: Endoscopy;  Laterality: N/A;   ESOPHAGOGASTRODUODENOSCOPY  11/25/2017   EXCISION VAGINAL CYST N/A 12/22/2021   Procedure: EXCISION VULVAR CYST;  Surgeon: Benjaman Kindler, MD;  Location: [facility];  Service: Gynecology;  Laterality: N/A;   EXTRACORPOREAL SHOCK WAVE LITHOTRIPSY Left  09/04/2018   Procedure: EXTRACORPOREAL SHOCK WAVE LITHOTRIPSY (ESWL);  Surgeon: Billey Co, MD;  Location: [facility];  Service: Urology;  Laterality: Left;   FOREIGN BODY REMOVAL Left    knee-as child   KNEE ARTHROSCOPY     NASAL SINUS SURGERY N/A 04/13/2015   Procedure: nasal endoscopy with adenoid biopsy;  Surgeon: Ruby Cola, MD;  Location: [place];  Service: ENT;  Laterality: N/A;   NECK SURGERY  2016   x 3 all together   POSTERIOR CERVICAL FUSION/FORAMINOTOMY N/A 11/13/2016   Procedure: CERVICAL TWO-CERVICAL SIX POSTERIOR CERVICAL FUSION WITH LATERAL MASS FIXATION;  Surgeon: Kristeen Miss, MD;  Location: [place];  Service: Neurosurgery;  Laterality: N/A;  posterior approach   REDUCTION MAMMAPLASTY  10/2019   ROTATOR CUFF REPAIR Left 2014   SHOULDER ACROMIOPLASTY Left 05/18/2015   Procedure: SHOULDER ACROMIOPLASTY;  Surgeon: Earlie Server, MD;  Location: [place];  Service: Orthopedics;  Laterality: Left;   SHOULDER ARTHROSCOPY WITH DISTAL CLAVICLE RESECTION Left 05/18/2015   Procedure: LEFT SHOULDER ARTHROSCOPY WITH  DISTAL CLAVICLE RESECTION;  Surgeon: Earlie Server, MD;  Location: [place];  Service: Orthopedics;  Laterality: Left;  URETEROSCOPY Bilateral 09/26/2021   Procedure: URETEROSCOPY;  Surgeon: Abbie Sons, MD;  Location: [facility];  Service: Urology;  Laterality: Bilateral;   XI ROBOTIC ASSISTED OOPHORECTOMY N/A 12/22/2021   Procedure: XI ROBOTIC ASSISTED DIAGNOSTIC LAPAROSCOPY, LEFT OOPHORECTOMY, WITH RIGHT SALPINGECTOMY AND LYSIS OF ADHESIONS;  Surgeon: Benjaman Kindler, MD;  Location: [facility];  Service: Gynecology;  Laterality: N/A;    Home Medications:  Allergies as of 08/27/2022       Reactions   Shellfish Allergy Anaphylaxis, Hives   "Swells throat up"   Peanut-containing Drug Products Hives   Codeine Other (See Comments)   jitters   Diphtheria Toxoid Rash   Tetanus Toxoids Rash        Medication List         Accurate as of August 26, 2022  2:43 PM. If you have any questions, ask your nurse or doctor.          acetaminophen 500 MG tablet Commonly known as: TYLENOL Take 500 mg by mouth 2 (two) times daily as needed for headache.   amLODipine 5 MG tablet Commonly known as: NORVASC Take 5 mg by mouth daily.   cholecalciferol 25 MCG (1000 UNIT) tablet Commonly known as: VITAMIN D3 Take 1,000 Units by mouth daily.   estradiol 1 MG tablet Commonly known as: ESTRACE Take 1.5 mg by mouth daily.   gabapentin 600 MG tablet Commonly known as: Neurontin Take 1 tablet (600 mg total) by mouth 3 (three) times daily.   hydrOXYzine 10 MG tablet Commonly known as: ATARAX Take 1 tablet (10 mg total) by mouth 3 (three) times daily as needed.   meloxicam 15 MG tablet Commonly known as: MOBIC Take 15 mg by mouth daily as needed for pain.   NONFORMULARY OR COMPOUNDED ITEM Place 10 mg vaginally at bedtime as needed (spasms). Diazepam 10 mg   Oxycodone HCl 10 MG Tabs Take 1 tablet (10 mg total) by mouth every 8 (eight) hours as needed (pain).   pantoprazole 40 MG tablet Commonly known as: PROTONIX Take 40 mg by mouth every morning.   rizatriptan 10 MG tablet Commonly known as: MAXALT Take 10 mg by mouth as needed for migraine. May repeat in 2 hours if needed   Uro-MP 118 MG Caps TAKE 1 CAPSULE BY MOUTH EVERY 6 HOURS AS NEEDED   valACYclovir 1000 MG tablet Commonly known as: VALTREX Take 1,000 mg by mouth daily.   VISINE OP Apply 1 drop to eye daily as needed (irritation).        Allergies:  Allergies  Allergen Reactions   Shellfish Allergy Anaphylaxis and Hives    "Swells throat up"   Peanut-Containing Drug Products Hives   Codeine Other (See Comments)    jitters   Diphtheria Toxoid Rash   Tetanus Toxoids Rash    Family History: Family History  Problem Relation Age of Onset   Cancer Father        prostate cancer   Hypertension Father    Hypertension Other    Breast  cancer Neg Hx     Social History:  reports that she has never smoked. She has never used smokeless tobacco. She reports current alcohol use. She reports that she does not use drugs.  ROS: Pertinent ROS in HPI  Physical Exam: There were no vitals taken for this visit.  Constitutional:  Well nourished. Alert and oriented, No acute distress. HEENT: [place] AT, moist mucus membranes.  Trachea midline, no masses. Cardiovascular: No clubbing, cyanosis, or edema. Respiratory:  Normal respiratory effort, no increased work of breathing. GU: No CVA tenderness.  No bladder fullness or masses. Vulvovaginal atrophy w/ pallor, loss of rugae, introital retraction, excoriations.  Vulvar thinning, fusion of labia, clitoral hood retraction, prominent urethral meatus.   *** external genitalia, *** pubic hair distribution, no lesions.  Normal urethral meatus, no lesions, no prolapse, no discharge.   No urethral masses, tenderness and/or tenderness. No bladder fullness, tenderness or masses. *** vagina mucosa, *** estrogen effect, no discharge, no lesions, *** pelvic support, *** cystocele and *** rectocele noted.  No cervical motion tenderness.  Uterus is freely mobile and non-fixed.  No adnexal/parametria masses or tenderness noted.  Anus and perineum are without rashes or lesions.   ***  Neurologic: Grossly intact, no focal deficits, moving all 4 extremities. Psychiatric: Normal mood and affect.    Laboratory Data: Serum creatinine (07/2022) 1.2 Urinalysis See EPIC and HPI I have reviewed the labs.   Pertinent Imaging: N/A   Assessment & Plan:    1. Painful urination -Likely secondary to IC -UA likely contaminated  -Discussed rescue solution treatments to help control her pain, but she deferred  -Will have her restart the amitriptyline and hydroxyzine at this time -Uribel has become unaffordable so we will discontinue that  2. Microscopic hematuria -UA w/ 3-10 RBC's  -Will recheck UA with a  catheterized specimen when she returns in 6 weeks   No follow-ups on file.  These notes generated with voice recognition software. I apologize for typographical errors.  [place], [place] [address]  [place], [place] [address] [phone]

## 2022-08-27 ENCOUNTER — Ambulatory Visit: Payer: Medicare Other | Admitting: Urology

## 2022-08-27 ENCOUNTER — Other Ambulatory Visit: Payer: Self-pay

## 2022-08-27 ENCOUNTER — Emergency Department
Admission: EM | Admit: 2022-08-27 | Discharge: 2022-08-27 | Disposition: A | Discharge status: Home / Self Care | Payer: 59 | Attending: Emergency Medicine | Admitting: Emergency Medicine

## 2022-08-27 ENCOUNTER — Telehealth: Payer: Self-pay | Admitting: Urology

## 2022-08-27 ENCOUNTER — Encounter: Payer: Self-pay | Admitting: Emergency Medicine

## 2022-08-27 DIAGNOSIS — R350 Frequency of micturition: Secondary | ICD-10-CM | POA: Diagnosis not present

## 2022-08-27 LAB — WET PREP, GENITAL
Clue Cells Wet Prep HPF POC: NONE SEEN
Sperm: NONE SEEN
Trich, Wet Prep: NONE SEEN
WBC, Wet Prep HPF POC: <10 (ref <10)

## 2022-08-27 LAB — URINALYSIS, ROUTINE W REFLEX MICROSCOPIC
Bilirubin Urine: NEGATIVE
Glucose, UA: NEGATIVE mg/dL
Ketones, ur: NEGATIVE mg/dL
Leukocytes,Ua: NEGATIVE
Nitrite: NEGATIVE
Protein, ur: NEGATIVE mg/dL
Specific Gravity, Urine: 1.014 (ref 1.005–1.030)
pH: 7 (ref 5.0–8.0)

## 2022-08-27 MED ORDER — ACETAMINOPHEN 500 MG PO TABS
1000.0000 mg | ORAL_TABLET | Freq: Once | ORAL | Status: AC
  Administered 2022-08-27: 1000 mg via ORAL
  Filled 2022-08-27: qty 2

## 2022-08-27 MED ORDER — FLUCONAZOLE 150 MG PO TABS: 150.0000 mg | ORAL_TABLET | ORAL | 0 refills | Status: AC

## 2022-08-27 NOTE — Telephone Encounter (Signed)
Patient stopped by office to advise that she went to ER this morning to be seen for UTI and was given an antibiotic. She cancelled appointment with our office for this afternoon.  She will call back after finishing antibiotic if needed.

## 2022-08-27 NOTE — Discharge Instructions (Addendum)
-  Your test show that you have a yeast infection.  I will prescribe you a increased dosage of the fluconazole to see if we can help manage this infection.  However, please continue to follow-up with urology later today for further evaluation as well.  -Return to the emergency department anytime if you begin to experience any new or worsening symptoms.

## 2022-08-27 NOTE — ED Provider Notes (Signed)
[facility] Provider Note    Event Date/Time   First MD Initiated Contact with Patient 08/27/22 [phone]     (approximate)   History   Chief Complaint Urinary Frequency   HPI Kimberly [name] is a 62 y.o. female, history of anxiety, GERD, hyperlipidemia, asthma, presents to the emergency department for evaluation of urinary frequency.  Patient states that she has been experiencing a persistent burning sensation along her urethral and vaginal region for the past few months.  She states that she has been treated with antibiotics and yeast infections in the past, though she states that this has not helped much.  She was most recently prescribed Uribel, which has not been helping with her pain.  She has an appointment with urology later today, but states that she feels like she cannot wait any longer.  Denies fever/chills, chest pain, shortness of breath, myalgias, abdominal pain, flank pain, nausea/vomiting, diarrhea, hematuria, headache, weakness, rash/lesions, or dizziness/lightheadedness.  History Limitations: No limitations.        Physical Exam  Triage Vital Signs: ED Triage Vitals  Enc Vitals Group     BP 08/27/22 0815 (!) 140/88     Pulse Rate 08/27/22 0815 98     Resp 08/27/22 0815 16     Temp 08/27/22 0815 97.6 F (36.4 C)     Temp Source 08/27/22 0815 Oral     SpO2 08/27/22 0815 99 %     Weight 08/27/22 0812 143 lb 15.4 oz (65.3 kg)     Height 08/27/22 0812 '5\' 1"'$  (1.549 m)     Head Circumference --      Peak Flow --      Pain Score --      Pain Loc --      Pain Edu? --      Excl. in [employer]? --     Most recent vital signs: Vitals:   08/27/22 0815  BP: (!) 140/88  Pulse: 98  Resp: 16  Temp: 97.6 F (36.4 C)  SpO2: 99%    General: Awake, NAD.  Skin: Warm, dry. No rashes or lesions.  Eyes: PERRL. Conjunctivae normal.  CV: Good peripheral perfusion.  Resp: Normal effort.  Abd: Soft, non-tender. No distention.  Neuro: At baseline. No  gross neurological deficits.  Musculoskeletal: Normal ROM of all extremities.  Focused Exam: Pelvic exam shows moderate white discharge in the vaginal canal.  Cervix appears normal.  No active bleeding or discharge.  No obvious lesions along the vaginal wall.  Physical Exam    ED Results / Procedures / Treatments  Labs (all labs ordered are listed, but only abnormal results are displayed) Labs Reviewed  WET PREP, GENITAL - Abnormal; Notable for the following components:      Result Value   Yeast Wet Prep HPF POC PRESENT (*)    All other components within normal limits  URINALYSIS, ROUTINE W REFLEX MICROSCOPIC - Abnormal; Notable for the following components:   Color, Urine YELLOW (*)    APPearance HAZY (*)    Hgb urine dipstick SMALL (*)    Bacteria, UA RARE (*)    All other components within normal limits     EKG N/A.    RADIOLOGY  ED Provider Interpretation: N/A.  No results found.  PROCEDURES:  Critical Care performed: N/A.  Procedures    MEDICATIONS ORDERED IN ED: Medications - No data to display   IMPRESSION / MDM / ASSESSMENT AND PLAN / ED COURSE  I reviewed  the triage vital signs and the nursing notes.                              Differential diagnosis includes, but is not limited to, cystitis, vaginal candidiasis, bacterial vaginosis, trichomoniasis, dysuria.  Assessment/Plan Patient patient consistent with vaginal candidiasis, confirmed by wet prep.  Urinalysis shows no evidence of urinary tract infection.  No evidence of any systemic symptoms.  She appears clinically well.  Low suspicion for any serious or life-threatening pathology.  Vitals within normal limits.  Given that this is a recurrent vaginal yeast infection, will provide her with increased dosage of fluconazole.  Encouraged her to continue to follow-up with urology today for further evaluation and to follow-up with the primary care provider as needed for symptoms persist.  She was  amenable to this.  Will discharge.  Provided the patient with anticipatory guidance, return precautions, and educational material. Encouraged the patient to return to the emergency department at any time if they begin to experience any new or worsening symptoms. Patient expressed understanding and agreed with the plan.   Patient's presentation is most consistent with acute complicated illness / injury requiring diagnostic workup.       FINAL CLINICAL IMPRESSION(S) / ED DIAGNOSES   Final diagnoses:  Urinary frequency     Rx / DC Orders   ED Discharge Orders          Ordered    fluconazole (DIFLUCAN) 150 MG tablet  Every 3 DAYS        08/27/22 0937             Note:  This document was prepared using Dragon voice recognition software and may include unintentional dictation errors.   Teodoro Spray, [place] 08/27/22 1610    Nathaniel Man, MD 08/28/22 [phone]

## 2022-08-28 ENCOUNTER — Encounter: Payer: Self-pay | Admitting: Student in an Organized Health Care Education/Training Program

## 2022-08-28 ENCOUNTER — Ambulatory Visit
Payer: 59 | Attending: Student in an Organized Health Care Education/Training Program | Admitting: Student in an Organized Health Care Education/Training Program

## 2022-08-28 VITALS — BP 134/85 | HR 101 | Temp 97.2°F | Ht 61.0 in | Wt 147.0 lb

## 2022-08-28 DIAGNOSIS — M47812 Spondylosis without myelopathy or radiculopathy, cervical region: Secondary | ICD-10-CM | POA: Insufficient documentation

## 2022-08-28 DIAGNOSIS — Z9889 Other specified postprocedural states: Secondary | ICD-10-CM | POA: Diagnosis present

## 2022-08-28 DIAGNOSIS — M7918 Myalgia, other site: Secondary | ICD-10-CM | POA: Insufficient documentation

## 2022-08-28 DIAGNOSIS — Q761 Klippel-Feil syndrome: Secondary | ICD-10-CM | POA: Insufficient documentation

## 2022-08-28 DIAGNOSIS — G894 Chronic pain syndrome: Secondary | ICD-10-CM | POA: Diagnosis present

## 2022-08-28 DIAGNOSIS — M5481 Occipital neuralgia: Secondary | ICD-10-CM | POA: Insufficient documentation

## 2022-08-28 DIAGNOSIS — M5030 Other cervical disc degeneration, unspecified cervical region: Secondary | ICD-10-CM | POA: Diagnosis present

## 2022-08-28 MED ORDER — OXYCODONE HCL 5 MG PO TABS: 5.0000 mg | ORAL_TABLET | Freq: Four times a day (QID) | ORAL | 0 refills | Status: DC | PRN

## 2022-08-28 MED ORDER — GABAPENTIN 600 MG PO TABS: 600.0000 mg | ORAL_TABLET | Freq: Three times a day (TID) | ORAL | 5 refills | Status: DC

## 2022-08-28 MED ORDER — OXYCODONE HCL 5 MG PO TABS: 5.0000 mg | ORAL_TABLET | Freq: Four times a day (QID) | ORAL | 0 refills | Status: AC | PRN

## 2022-08-28 NOTE — Progress Notes (Signed)
Nursing Pain Medication Assessment:  Safety precautions to be maintained throughout the outpatient stay will include: orient to surroundings, keep bed in low position, maintain call bell within reach at all times, provide assistance with transfer out of bed and ambulation.  Medication Inspection Compliance: Pill count conducted under aseptic conditions, in front of the patient. Neither the pills nor the bottle was removed from the patient's sight at any time. Once count was completed pills were immediately returned to the patient in their original bottle.  Medication: Oxycodone IR Pill/Patch Count:  23 of 90 pills remain Pill/Patch Appearance: Markings consistent with prescribed medication Bottle Appearance: Standard pharmacy container. Clearly labeled. Filled Date: 01 / 08 / 2024 Last Medication intake:  TodaySafety precautions to be maintained throughout the outpatient stay will include: orient to surroundings, keep bed in low position, maintain call bell within reach at all times, provide assistance with transfer out of bed and ambulation.

## 2022-08-28 NOTE — Progress Notes (Signed)
PROVIDER NOTE: Information contained herein reflects review and annotations entered in association with encounter. Interpretation of such information and data should be left to medically-trained personnel. Information provided to patient can be located elsewhere in the medical record under "Patient Instructions". Document created using STT-dictation technology, any transcriptional errors that may result from process are unintentional.    Patient: Kimberly [name]  Service Category: E/M  Provider: Gillis Santa, MD  DOB: Apr 13, 1961  DOS: 08/28/2022  Specialty: Interventional Pain Management  MRN: [id number]  Setting: Ambulatory outpatient  PCP: Kimberly Body, MD  Type: Established Patient    Referring Provider: Dion Body, MD  Location: Office  Delivery: Face-to-face     HPI  [name], a 62 y.o. year old female, is here today because of her Cervical spondylosis without myelopathy [M47.812]. [name] primary complain today is Headache (Back of head)  Last encounter: My last encounter with her was on 05/15/22  Pertinent problems: [name] has SHOULDER PAIN; IMPINGEMENT SYNDROME; RUPTURE ROTATOR CUFF; CTS (carpal tunnel syndrome); Cervical spondylosis without myelopathy; Cervical stenosis of spinal canal; Pseudoarthrosis of cervical spine ([place]); Numbness and tingling; Vitamin D deficiency; Cervical fusion syndrome; Hx of cervical spine surgery; Chronic pain syndrome; Cervicalgia; Controlled substance agreement signed; Depression, major, single episode, complete remission ([place]); Cervical myofascial pain syndrome; and Occipital headache on their pertinent problem list. Pain Assessment: Severity of Chronic pain is reported as a 6 /10. Location: Head Mid/radiates to shoulders. Onset: More than a month ago. Quality: Constant, Aching. Timing: Constant. Modifying factor(s): meds, heat. Vitals:  height is '5\' 1"'$  (1.549 m) and [name] is 147 lb (66.7 kg). Her temporal temperature is 97.2  F (36.2 C) (abnormal). Her blood pressure is 134/85 and her pulse is 101 (abnormal). Her oxygen saturation is 98%.   Reason for encounter: medication management.    Patient presents today for medication management.  She states that 10 mg of oxycodone is not very effective for her pain and she states that she got better relief with 5 mg instead but is requesting every 6 hours dosing as opposed to every 8 hour dosing.  This will be a dose reduction of 10 mg daily.  We will wean her from oxycodone 10 mg every 8 hours as needed to 5 mg every 6 hours as needed.  This is an MME reduction from 45-->30.  I will also refill her gabapentin below.  Unfortunately, since her last visit with me, she did have a syncopal event where she hit the bathroom floor and hurt her forehead.  Afterwards she did feel off balance and did follow-up with her primary care doctor.  She did get a CT of her head which was negative for any acute processes relating to her fall.   Pharmacotherapy Assessment  Analgesic: Oxycodone '5mg'$  every 6hours as needed, quantity 90/month; MME equals 30    Monitoring: [place] PMP: PDMP reviewed during this encounter.       Pharmacotherapy: No side-effects or adverse reactions reported. Compliance: No problems identified. Effectiveness: Clinically acceptable.  UDS:  Summary  Date Value Ref Range Status  02/13/2022 Note  Final    Comment:    ==================================================================== [employer] Select 13 (MW) ==================================================================== Test                             Result       Flag       Units  Drug Present and Declared for Prescription Verification  Oxycodone                      3605         EXPECTED   ng/mg creat   Oxymorphone                    3670         EXPECTED   ng/mg creat   Noroxycodone                   2769         EXPECTED   ng/mg creat   Noroxymorphone                 1088         EXPECTED   ng/mg  creat    Sources of oxycodone are scheduled prescription medications.    Oxymorphone, noroxycodone, and noroxymorphone are expected    metabolites of oxycodone. Oxymorphone is also available as a    scheduled prescription medication.  Drug Absent but Declared for Prescription Verification   Diazepam                       Not Detected UNEXPECTED ng/mg creat ==================================================================== Test                      Result    Flag   Units      Ref Range   Creatinine              119              mg/dL      >=20 ==================================================================== Declared Medications:  The flagging and interpretation on this report are based on the  following declared medications.  Unexpected results may arise from  inaccuracies in the declared medications.   **Note: The testing scope of this panel includes these medications:   Diazepam  Oxycodone   **Note: The testing scope of this panel does not include the  following reported medications:   Acetaminophen (Tylenol)  Amitriptyline (Elavil)  Amlodipine (Norvasc)  Estradiol (Estrace)  Eye Drop  Gabapentin (Neurontin)  Hydroxyzine (Atarax)  Hyoscyamine (Uribel)  Meloxicam (Mobic)  Methenamine (Uribel)  Methylene Blue (Uribel)  Pantoprazole (Protonix)  Phenyl salicylate (Uribel)  Rizatriptan (Maxalt)  Sodium phosphate, monobasic (Uribel)  Valacyclovir (Valtrex)  Vitamin D3 ==================================================================== For clinical consultation, please call [phone]. ====================================================================       ROS  Constitutional: Denies any fever or chills +headaches, occipital dominant Gastrointestinal: No reported hemesis, hematochezia, vomiting, or acute GI distress Musculoskeletal: Denies any acute onset joint swelling, redness, loss of ROM, or weakness Neurological: No reported episodes of acute onset  apraxia, aphasia, dysarthria, agnosia, amnesia, paralysis, loss of coordination, or loss of consciousness  Medication Review  NONFORMULARY OR COMPOUNDED ITEM, Tetrahydrozoline HCl, Uro-MP, acetaminophen, amLODipine, cholecalciferol, cyclobenzaprine, estradiol, fluconazole, gabapentin, hydrOXYzine, liothyronine, meloxicam, oxyCODONE, pantoprazole, rizatriptan, and valACYclovir  History Review  Allergy: [name] is allergic to shellfish allergy, peanut-containing drug products, codeine, diphtheria toxoid, and tetanus toxoids. Drug: [name]  reports no history of drug use. Alcohol:  reports current alcohol use. Tobacco:  reports that she has never smoked. She has never used smokeless tobacco. Social: [name]  reports that she has never smoked. She has never used smokeless tobacco. She reports current alcohol use. She reports that she does not use drugs. Medical:  has a past medical history of Anxiety, Arthritis, Asthma, Balance  problem, Brain cyst, Carpal tunnel syndrome, bilateral, Cervical fusion syndrome, Complete rupture of rotator cuff, Complication of anesthesia, Degenerative disc disease, cervical, Depression, Difficult intubation, Flu (08/10/2018), GERD (gastroesophageal reflux disease), Headache, Herpes, History of kidney stones, History of pneumonia, Hyperlipidemia, Hypertension, Hypothyroidism, Inflammation of shoulder joint, Memory difficulties, Neuromuscular disorder ([place]), Occipital neuralgia, Pneumothorax on right, Pseudoarthrosis of cervical spine ([place]), Recurrent falls, Syncope, and Urinary frequency. Surgical: [name]  has a past surgical history that includes Abdominal hysterectomy; Rotator cuff repair (Left, 2014); Foreign [name] Removal (Left); Carpal tunnel release (Left, 02/16/2013); Carpal tunnel release (Right, 03/27/2013); Cesarean section; Anterior cervical decomp/discectomy fusion (N/A, 08/06/2014); Colonoscopy; Nasal sinus surgery (N/A, 04/13/2015); Shoulder  arthroscopy with distal clavicle resection (Left, 05/18/2015); Shoulder acromioplasty (Left, 05/18/2015); Cholecystectomy (N/A, 06/17/2015); Neck surgery (2016); Anterior cervical decomp/discectomy fusion (N/A, 07/04/2016); Posterior cervical fusion/foraminotomy (N/A, 11/13/2016); Knee arthroscopy; Extracorporeal shock wave lithotripsy (Left, 09/04/2018); Colonoscopy with propofol (N/A, 05/06/2019); Reduction mammaplasty (10/2019); Esophagogastroduodenoscopy (N/A, 05/08/2021); cysto with hydrodistension (N/A, 09/26/2021); Cystoscopy w/ retrogrades (Bilateral, 09/26/2021); Cystoscopy with biopsy (N/A, 09/26/2021); Ureteroscopy (Bilateral, 09/26/2021); Esophagogastroduodenoscopy (11/25/2017); Xi robotic assisted oophorectomy (N/A, 12/22/2021); and Excision vaginal cyst (N/A, 12/22/2021). Family: family history includes Cancer in her father; Hypertension in her father and another family member.  Laboratory Chemistry Profile   Renal Lab Results  Component Value Date   BUN 11 08/26/2021   CREATININE 0.80 11/16/2021   BCR 10 07/01/2019   GFRAA 86 07/01/2019   GFRNONAA >60 08/26/2021     Hepatic Lab Results  Component Value Date   AST 20 08/10/2021   ALT 12 08/10/2021   ALBUMIN 3.3 (L) 08/10/2021   ALKPHOS 58 08/10/2021   LIPASE 34 08/10/2021     Electrolytes Lab Results  Component Value Date   NA 136 08/26/2021   K 3.5 08/26/2021   CL 99 08/26/2021   CALCIUM 9.6 08/26/2021     Bone No results found for: "VD25OH", "VD125OH2TOT", "EX9371IR6", "VE9381OF7", "25OHVITD1", "25OHVITD2", "25OHVITD3", "TESTOFREE", "TESTOSTERONE"   Inflammation (CRP: Acute Phase) (ESR: Chronic Phase) Lab Results  Component Value Date   ESRSEDRATE 35 (H) 08/12/2021       Note: Above Lab results reviewed.  Recent Imaging Review  CT HEAD WO CONTRAST (5MM) CLINICAL DATA:  Syncopal episode 3 days ago. Recent head trauma. Dizziness.  EXAM: CT HEAD WITHOUT CONTRAST  TECHNIQUE: Contiguous axial images were  obtained from the base of the skull through the vertex without intravenous contrast.  RADIATION DOSE REDUCTION: This exam was performed according to the departmental dose-optimization program which includes automated exposure control, adjustment of the mA and/or kV according to patient size and/or use of iterative reconstruction technique.  COMPARISON:  Head MRI 03/20/2019  FINDINGS: Brain: There is no evidence of an acute infarct, intracranial hemorrhage, mass, midline shift, or extra-axial fluid collection. There is mild cerebral atrophy. Hypodensities in the cerebral white matter bilaterally are nonspecific but compatible with mild chronic small vessel ischemic disease. A dilated perivascular space is again noted at the inferior aspect of the left putamen.  Vascular: No hyperdense vessel.  Skull: No acute fracture or suspicious osseous lesion.  Sinuses/Orbits: Visualized paranasal sinuses and mastoid air cells are clear. Visualized portions of the orbits are unremarkable.  Other: None.  IMPRESSION: 1. No evidence of acute intracranial abnormality. 2. Mild chronic small vessel ischemic disease.  Electronically Signed   By: Logan Bores M.D.   On: 08/20/2022 14:52  Note: Reviewed        Physical Exam  General appearance: Well nourished, well developed, and  well hydrated. In no apparent acute distress Mental status: Alert, oriented x 3 (person, place, & time)       Respiratory: No evidence of acute respiratory distress Eyes: PERLA Vitals: BP 134/85 (BP Location: Right Arm, Patient Position: Sitting, Cuff Size: Normal)   Pulse (!) 101   Temp (!) 97.2 F (36.2 C) (Temporal)   Ht '5\' 1"'$  (1.549 m)   Wt 147 lb (66.7 kg)   SpO2 98%   BMI 27.78 kg/m  BMI: Estimated [name] mass index is 27.78 kg/m as calculated from the following:   Height as of this encounter: '5\' 1"'$  (1.549 m).   [name] as of this encounter: 147 lb (66.7 kg). Ideal: Ideal [name] [name]: 47.8 kg (105 lb 6.1  oz) Adjusted ideal [name] [name]: 55.4 kg (122 lb 0.5 oz)  Cervical Spine Exam  Skin & Axial Inspection: Well healed scar from previous spine surgery detected Alignment: Symmetrical Functional ROM: Pain restricted ROM      Stability: No instability detected Muscle Tone/Strength: Functionally intact. No obvious neuro-muscular anomalies detected. Sensory (Neurological): Neurogenic pain pattern Palpation: No palpable anomalies                          Upper Extremity (UE) Exam      Side: Right upper extremity   Side: Left upper extremity    Skin & Extremity Inspection: Skin color, temperature, and hair growth are WNL. No peripheral edema or cyanosis. No masses, redness, swelling, asymmetry, or associated skin lesions. No contractures.   Skin & Extremity Inspection: Skin color, temperature, and hair growth are WNL. No peripheral edema or cyanosis. No masses, redness, swelling, asymmetry, or associated skin lesions. No contractures.    Functional ROM: Unrestricted ROM           Functional ROM: Unrestricted ROM            Muscle Tone/Strength: Functionally intact. No obvious neuro-muscular anomalies detected.   Muscle Tone/Strength: Functionally intact. No obvious neuro-muscular anomalies detected.    Sensory (Neurological): Unimpaired           Sensory (Neurological): Unimpaired            Palpation: No palpable anomalies               Palpation: No palpable anomalies                Provocative Test(s):  Phalen's test: deferred Tinel's test: deferred Apley's scratch test (touch opposite shoulder):  Action 1 (Across chest): deferred Action 2 (Overhead): deferred Action 3 (LB reach): deferred     Provocative Test(s):  Phalen's test: deferred Tinel's test: deferred Apley's scratch test (touch opposite shoulder):  Action 1 (Across chest): deferred Action 2 (Overhead): deferred Action 3 (LB reach): deferred       Assessment   Status Diagnosis  Controlled Controlled Controlled 1. Cervical  spondylosis without myelopathy   2. Hx of cervical spine surgery   3. Bilateral occipital neuralgia   4. Cervical myofascial pain syndrome   5. Cervical fusion syndrome (C2-T1)   6. Degenerative disc disease, cervical   7. Chronic pain syndrome         Plan of Care   [name] has a current medication list which includes the following long-term medication(s): gabapentin.  Pharmacotherapy (Medications Ordered): Meds ordered this encounter  Medications   oxyCODONE (OXY IR/ROXICODONE) 5 MG immediate release tablet    Sig: Take 1 tablet (5 mg total) by  mouth every 6 (six) hours as needed for severe pain. Must last 30 days.    Dispense:  120 tablet    Refill:  0    Chronic Pain: STOP Act (Not applicable) Fill 1 day early if closed on refill date. Avoid benzodiazepines within 8 hours of opioids   oxyCODONE (OXY IR/ROXICODONE) 5 MG immediate release tablet    Sig: Take 1 tablet (5 mg total) by mouth every 6 (six) hours as needed for severe pain. Must last 30 days.    Dispense:  120 tablet    Refill:  0    Chronic Pain: STOP Act (Not applicable) Fill 1 day early if closed on refill date. Avoid benzodiazepines within 8 hours of opioids   oxyCODONE (OXY IR/ROXICODONE) 5 MG immediate release tablet    Sig: Take 1 tablet (5 mg total) by mouth every 6 (six) hours as needed for severe pain. Must last 30 days.    Dispense:  120 tablet    Refill:  0    Chronic Pain: STOP Act (Not applicable) Fill 1 day early if closed on refill date. Avoid benzodiazepines within 8 hours of opioids   gabapentin (NEURONTIN) 600 MG tablet    Sig: Take 1 tablet (600 mg total) by mouth 3 (three) times daily.    Dispense:  90 tablet    Refill:  5   UDS up-to-date and appropriate.  Follow-up plan:   Return in about 14 weeks (around 12/04/2022) for Medication Management, in person.     cervical TPI, cervical facet medial branch nerve block, suprascapular nerve block, bilateral C4, C5, C6   cervical facet  medial branch nerve block 07/04/2020: Not effective, do not repeat.        Recent Visits No visits were found meeting these conditions. Showing recent visits within past 90 days and meeting all other requirements Today's Visits Date Type Provider Dept  08/28/22 Office Visit Gillis Santa, MD [facility]  Showing today's visits and meeting all other requirements Future Appointments No visits were found meeting these conditions. Showing future appointments within next 90 days and meeting all other requirements  I discussed the assessment and treatment plan with the patient. The patient was provided an opportunity to ask questions and all were answered. The patient agreed with the plan and demonstrated an understanding of the instructions.  Patient advised to call back or seek an in-person evaluation if the symptoms or condition worsens.  Duration of encounter: 30 minutes.  Note by: Gillis Santa, MD Date: 08/28/2022; Time: 11:26 AM

## 2022-08-29 ENCOUNTER — Other Ambulatory Visit: Payer: Self-pay | Admitting: Plastic Surgery

## 2022-08-29 DIAGNOSIS — N644 Mastodynia: Secondary | ICD-10-CM

## 2022-08-31 ENCOUNTER — Other Ambulatory Visit: Payer: Self-pay | Admitting: Urology

## 2022-08-31 DIAGNOSIS — R300 Dysuria: Secondary | ICD-10-CM

## 2022-08-31 NOTE — Telephone Encounter (Signed)
Pt called and is out of URO-MP.  Please refill to [facility], [place] Pt has appt 09/04/22 for UTI at 2:20.

## 2022-09-02 ENCOUNTER — Other Ambulatory Visit: Payer: Self-pay | Admitting: Urology

## 2022-09-02 DIAGNOSIS — R300 Dysuria: Secondary | ICD-10-CM

## 2022-09-03 ENCOUNTER — Other Ambulatory Visit: Payer: Self-pay | Admitting: Plastic Surgery

## 2022-09-03 DIAGNOSIS — N644 Mastodynia: Secondary | ICD-10-CM

## 2022-09-03 NOTE — Progress Notes (Signed)
09/04/2022 2:31 PM   Kimberly [name] Aug 11, 1960 [id number]  Referring provider: Dion Body, MD [facility] [place],  [place] [address]  Urological history: 1.  High risk hematuria -Non-smoker -CTU (08/25/2021) - Punctuate nonobstructive calculus of the superior pole of the left kidney -cysto/hydro distention w/ bilateral RTG's (08/2021) - Post dilation glomerulations were noted lower lateral and posterior walls/trigone -contrast CT (03/2022) - Mild urothelial enhancement of right ureter - urine culture positive for E.coli -no reports of gross heme -UA ***  2. Interstitial cystitis -cysto/hydrodistention in February 2023 which noted post dilation glomerulations -Uribel, amitriptyline 25 mg and hydroxyzine 10 mg TID, prn   3.  Nephrolithiasis -CTU (07/2021) - Punctuate nonobstructive calculus of the superior pole of the left kidney  Chief Complaint  Patient presents with   Dysuria    HPI: Kimberly [name] is a 62 y.o. female who presents today for 6 weeks follow up.    At her visit on June 06, 2022, she states she has been having dysuria and vaginal pains off-and-on for the last 3 weeks.  She has noticed a yellow vaginal discharge.  Uribel seems to help.  UA yellow slightly cloudy, trace ketone, specific gravity 1.015, trace blood, pH 7.5, trace protein, 3-10 RBCs, greater than 10 epithelial cells and many bacteria and presence of yeast.  PVR 0 mL.  Urine culture mixed urogenital flora.  She was started on Diflucan and Septra.  At her visit on July 11, 2022, she continues to have vaginal pain.  She states that it is not relieved by urination.  She denies any vaginal discharge or blood on the toilet paper when she wipes.  She also denies any issues with constipation, diarrhea or bloody stool.  She has not been taking her amitriptyline or hydroxyzine.  UA green cloudy, 1+ ketone, trace blood, pH 6.0, 2+ protein, 0-5 WBCs, 3-10 RBCs,  greater than 10 epithelial cells, granular casts present, hyaline cast present and many bacteria.  We restarted her amitriptyline hydroxylate at that appointment.  She called back a few days later stating that she discontinued her amitriptyline and hydroxyzine because it was making her tongue burn after drinking and eating.  She went back on the Uribel.  On the morning of her return appointment on January 29, she called [place] from the emergency department where she was with her husband stating that she was having severe pain and wanted to be seen before her afternoon appointment.  I did not have a morning appointment available for her that day, so she stayed in the emergency room and sought treatment there.  Wet prep performed in the ED noted the presence of yeast and she was given Diflucan.  She states that she still has constant pain in her vaginal wall.  She is completed a course of Diflucan and now is taking over-the-counter Monistat which provides some relief.  She denied any vaginal discharge.  She also states that she is having urinary urgency and dysuria.  1 capsule of Uribel seems to control her dysuria for almost 24 hours.  She also states that she feels bloated, but she denies any diarrhea or constipation.  Patient denies any modifying or aggravating factors.  Patient denies any gross hematuria, dysuria or suprapubic/flank pain.  Patient denies any fevers, chills, nausea or vomiting.    CATH UA yellow clear, trace ketone, specific gravity 1.015, trace blood, pH 7.0, 1+ protein, 0-5 WBCs, 3-10 RBCs, greater than 10 epithelial cells, [place]  casts present, RBC casts present, mucus threads are present and a few bacteria.  She felt that the cystoscopy hydrodistention and bilateral retrogrades performed in February did not provide any symptom relief.  PMH: Past Medical History:  Diagnosis Date   Anxiety    takes alprazolam - rare use    Arthritis    cervical spondylosis    Asthma    Balance  problem    Brain cyst    Carpal tunnel syndrome, bilateral    Cervical fusion syndrome    Complete rupture of rotator cuff    Complication of anesthesia    lung collapsed after neck fusion   Degenerative disc disease, cervical    Depression    Difficult intubation    Small mouth opening, limited neck flexion, very anterior    Flu 08/10/2018   tested positive   GERD (gastroesophageal reflux disease)    pt. reports that its better, no meds in use at this time- 2015   Headache    Herpes    History of kidney stones    History of pneumonia    Hyperlipidemia    Hypertension    pt. doesn't see cardiologist, followed for HTN by Dr. Gerarda Fraction   Hypothyroidism    Inflammation of shoulder joint    Memory difficulties    Neuromuscular disorder ([place])    joint and muscle problems   Occipital neuralgia    Pneumothorax on right    following C4-6 ACDF 07/04/16   Pseudoarthrosis of cervical spine (HCC)    Recurrent falls    Syncope    Urinary frequency     Surgical History: Past Surgical History:  Procedure Laterality Date   ABDOMINAL HYSTERECTOMY     ANTERIOR CERVICAL DECOMP/DISCECTOMY FUSION N/A 08/06/2014   Procedure: ANTERIOR CERVICAL DECOMPRESSION/DISCECTOMY FUSION CERVICAL THREE-FOUR,CERVICAL SIX-SEVEN ,CERVICAL SEVEN-THORACIC ONE;  Surgeon: Floyce Stakes, MD;  Location: [place];  Service: Neurosurgery;  Laterality: N/A;   ANTERIOR CERVICAL DECOMP/DISCECTOMY FUSION N/A 07/04/2016   Procedure: CERVICAL FOUR-FIVE, CERVICAL FIVE-SIX ANTERIOR CERVICAL DECOMPRESSION/DISCECTOMY/FUSION WITH REVISION OF CERVICAL THREE-FOUR PLATE;  Surgeon: Leeroy Cha, MD;  Location: [place];  Service: Neurosurgery;  Laterality: N/A;   CARPAL TUNNEL RELEASE Left 02/16/2013   Procedure: CARPAL TUNNEL RELEASE;  Surgeon: Carole Civil, MD;  Location[facility];  Service: Orthopedics;  Laterality: Left;   CARPAL TUNNEL RELEASE Right 03/27/2013   Procedure: RIGHT CARPAL TUNNEL RELEASE;  Surgeon: Carole Civil, MD;  Location: [facility];  Service: Orthopedics;  Laterality: Right;   CESAREAN SECTION     x2   CHOLECYSTECTOMY N/A 06/17/2015   Procedure: LAPAROSCOPIC CHOLECYSTECTOMY;  Surgeon: Aviva Signs, MD;  Location: [facility];  Service: General;  Laterality: N/A;   COLONOSCOPY     COLONOSCOPY WITH PROPOFOL N/A 05/06/2019   Procedure: COLONOSCOPY WITH PROPOFOL;  Surgeon: Robert Bellow, MD;  Location: [facility];  Service: Endoscopy;  Laterality: N/A;   CYSTO WITH HYDRODISTENSION N/A 09/26/2021   Procedure: CYSTOSCOPY/HYDRODISTENSION;  Surgeon: Abbie Sons, MD;  Location: [facility];  Service: Urology;  Laterality: N/A;   CYSTOSCOPY W/ RETROGRADES Bilateral 09/26/2021   Procedure: CYSTOSCOPY WITH RETROGRADE PYELOGRAM;  Surgeon: Abbie Sons, MD;  Location: [facility];  Service: Urology;  Laterality: Bilateral;   CYSTOSCOPY WITH BIOPSY N/A 09/26/2021   Procedure: CYSTOSCOPY WITH BIOPSY;  Surgeon: Abbie Sons, MD;  Location: [facility];  Service: Urology;  Laterality: N/A;   ESOPHAGOGASTRODUODENOSCOPY N/A 05/08/2021   Procedure: ESOPHAGOGASTRODUODENOSCOPY (EGD);  Surgeon: Lesly Rubenstein, MD;  Location: [facility];  Service: Endoscopy;  Laterality: N/A;   ESOPHAGOGASTRODUODENOSCOPY  11/25/2017   EXCISION VAGINAL CYST N/A 12/22/2021   Procedure: EXCISION VULVAR CYST;  Surgeon: Benjaman Kindler, MD;  Location: [facility];  Service: Gynecology;  Laterality: N/A;   EXTRACORPOREAL SHOCK WAVE LITHOTRIPSY Left 09/04/2018   Procedure: EXTRACORPOREAL SHOCK WAVE LITHOTRIPSY (ESWL);  Surgeon: Billey Co, MD;  Location: [facility];  Service: Urology;  Laterality: Left;   FOREIGN BODY REMOVAL Left    knee-as child   KNEE ARTHROSCOPY     NASAL SINUS SURGERY N/A 04/13/2015   Procedure: nasal endoscopy with adenoid biopsy;  Surgeon: Ruby Cola, MD;  Location: [facility];  Service: ENT;  Laterality: N/A;   NECK SURGERY  2016   x 3 all together   POSTERIOR CERVICAL FUSION/FORAMINOTOMY N/A  11/13/2016   Procedure: CERVICAL TWO-CERVICAL SIX POSTERIOR CERVICAL FUSION WITH LATERAL MASS FIXATION;  Surgeon: Kristeen Miss, MD;  Location: [place];  Service: Neurosurgery;  Laterality: N/A;  posterior approach   REDUCTION MAMMAPLASTY  10/2019   ROTATOR CUFF REPAIR Left 2014   SHOULDER ACROMIOPLASTY Left 05/18/2015   Procedure: SHOULDER ACROMIOPLASTY;  Surgeon: Earlie Server, MD;  Location: [place];  Service: Orthopedics;  Laterality: Left;   SHOULDER ARTHROSCOPY WITH DISTAL CLAVICLE RESECTION Left 05/18/2015   Procedure: LEFT SHOULDER ARTHROSCOPY WITH  DISTAL CLAVICLE RESECTION;  Surgeon: Earlie Server, MD;  Location: [place];  Service: Orthopedics;  Laterality: Left;   URETEROSCOPY Bilateral 09/26/2021   Procedure: URETEROSCOPY;  Surgeon: Abbie Sons, MD;  Location: [facility];  Service: Urology;  Laterality: Bilateral;   XI ROBOTIC ASSISTED OOPHORECTOMY N/A 12/22/2021   Procedure: XI ROBOTIC ASSISTED DIAGNOSTIC LAPAROSCOPY, LEFT OOPHORECTOMY, WITH RIGHT SALPINGECTOMY AND LYSIS OF ADHESIONS;  Surgeon: Benjaman Kindler, MD;  Location: [facility];  Service: Gynecology;  Laterality: N/A;    Home Medications:  Allergies as of 09/04/2022       Reactions   Shellfish Allergy Anaphylaxis, Hives   "Swells throat up"   Peanut-containing Drug Products Hives   Codeine Other (See Comments)   jitters   Diphtheria Toxoid Rash   Tetanus Toxoids Rash        Medication List        Accurate as of September 04, 2022  2:31 PM. If you have any questions, ask your nurse or doctor.          acetaminophen 500 MG tablet Commonly known as: TYLENOL Take 500 mg by mouth 2 (two) times daily as needed for headache.   amLODipine 5 MG tablet Commonly known as: NORVASC Take 5 mg by mouth daily.   cholecalciferol 25 MCG (1000 UNIT) tablet Commonly known as: VITAMIN D3 Take 1,000 Units by mouth daily.   cyclobenzaprine 10 MG tablet Commonly known as:  FLEXERIL Take 10 mg by mouth 3 (three) times daily as needed for muscle spasms.   estradiol 1 MG tablet Commonly known as: ESTRACE Take 1.5 mg by mouth daily.   fluconazole 100 MG tablet Commonly known as: DIFLUCAN Take 100 mg by mouth daily.   fluconazole 150 MG tablet Commonly known as: DIFLUCAN Take 1 tablet (150 mg total) by mouth every 3 (three) days for 9 days.   gabapentin 600 MG tablet Commonly known as: Neurontin Take 1 tablet (600 mg total) by mouth 3 (three) times daily.   hydrOXYzine 10 MG tablet Commonly known as: ATARAX Take 1 tablet (10 mg total) by mouth 3 (three) times daily as needed.   liothyronine  5 MCG tablet Commonly known as: CYTOMEL Take 5 mcg by mouth daily.   meloxicam 15 MG tablet Commonly known as: MOBIC Take 15 mg by mouth daily as needed for pain.   NONFORMULARY OR COMPOUNDED ITEM Place 10 mg vaginally at bedtime as needed (spasms). Diazepam 10 mg   oxyCODONE 5 MG immediate release tablet Commonly known as: Oxy IR/ROXICODONE Take 1 tablet (5 mg total) by mouth every 6 (six) hours as needed for severe pain. Must last 30 days. Start taking on: September 05, 2022   oxyCODONE 5 MG immediate release tablet Commonly known as: Oxy IR/ROXICODONE Take 1 tablet (5 mg total) by mouth every 6 (six) hours as needed for severe pain. Must last 30 days. Start taking on: October 05, 2022   oxyCODONE 5 MG immediate release tablet Commonly known as: Oxy IR/ROXICODONE Take 1 tablet (5 mg total) by mouth every 6 (six) hours as needed for severe pain. Must last 30 days. Start taking on: November 04, 2022   pantoprazole 40 MG tablet Commonly known as: PROTONIX Take 40 mg by mouth every morning.   rizatriptan 10 MG tablet Commonly known as: MAXALT Take 10 mg by mouth as needed for migraine. May repeat in 2 hours if needed   Uro-MP 118 MG Caps Take one every 6 hours prn for dysuria What changed: See the new instructions. Changed by: Zara Council, PA-C    valACYclovir 1000 MG tablet Commonly known as: VALTREX Take 1,000 mg by mouth daily.   VISINE OP Apply 1 drop to eye daily as needed (irritation).        Allergies:  Allergies  Allergen Reactions   Shellfish Allergy Anaphylaxis and Hives    "Swells throat up"   Peanut-Containing Drug Products Hives   Codeine Other (See Comments)    jitters   Diphtheria Toxoid Rash   Tetanus Toxoids Rash    Family History: Family History  Problem Relation Age of Onset   Cancer Father        prostate cancer   Hypertension Father    Hypertension Other    Breast cancer Neg Hx     Social History:  reports that she has never smoked. She has never used smokeless tobacco. She reports current alcohol use. She reports that she does not use drugs.  ROS: Pertinent ROS in HPI  Physical Exam: BP 106/74   Pulse (!) 111   Ht '5\' 1"'$  (1.549 m)   Wt 147 lb (66.7 kg)   BMI 27.78 kg/m   Constitutional:  Well nourished. Alert and oriented, No acute distress. HEENT: [place] AT, moist mucus membranes.  Trachea midline Cardiovascular: No clubbing, cyanosis, or edema. Respiratory: Normal respiratory effort, no increased work of breathing. GU: No CVA tenderness.  No bladder fullness or masses. Vulvovaginal atrophy w/ pallor, loss of rugae and introital retraction.  Anus and perineum are without rashes or lesions.     Neurologic: Grossly intact, no focal deficits, moving all 4 extremities. Psychiatric: Normal mood and affect.    Laboratory Data: Serum creatinine (07/2022) 1.2 Urinalysis See EPIC and HPI I have reviewed the labs.   Pertinent Imaging: N/A   Assessment & Plan:    1. Painful urination -Likely secondary to IC -CATH UA w/ micro heme w/RBC's casts - 2. Microscopic hematuria -UA w/ 3-10 RBC's  -Will recheck UA with a catheterized specimen when she returns in 6 weeks   No follow-ups on file.  These notes generated with voice recognition software. I apologize for typographical  errors.  [place], [place] [address]  [place], [place] [address] [phone]

## 2022-09-04 ENCOUNTER — Ambulatory Visit (INDEPENDENT_AMBULATORY_CARE_PROVIDER_SITE_OTHER): Payer: 59 | Admitting: Urology

## 2022-09-04 ENCOUNTER — Encounter: Payer: Self-pay | Admitting: Urology

## 2022-09-04 VITALS — BP 106/74 | HR 111 | Ht 61.0 in | Wt 147.0 lb

## 2022-09-04 DIAGNOSIS — R300 Dysuria: Secondary | ICD-10-CM | POA: Diagnosis not present

## 2022-09-04 DIAGNOSIS — R82998 Other abnormal findings in urine: Secondary | ICD-10-CM

## 2022-09-04 DIAGNOSIS — R3129 Other microscopic hematuria: Secondary | ICD-10-CM | POA: Diagnosis not present

## 2022-09-04 DIAGNOSIS — R102 Pelvic and perineal pain: Secondary | ICD-10-CM | POA: Diagnosis not present

## 2022-09-04 LAB — URINALYSIS, COMPLETE
Bilirubin, UA: NEGATIVE
Glucose, UA: NEGATIVE
Leukocytes,UA: NEGATIVE
Nitrite, UA: NEGATIVE
Specific Gravity, UA: 1.015 (ref 1.005–1.030)
Urobilinogen, Ur: 0.2 mg/dL (ref 0.2–1.0)
pH, UA: 7 (ref 5.0–7.5)

## 2022-09-04 LAB — MICROSCOPIC EXAMINATION: Epithelial Cells (non renal): >10 /hpf — AB (ref 0–10)

## 2022-09-04 MED ORDER — PREMARIN 0.625 MG/GM VA CREA: TOPICAL_CREAM | VAGINAL | 12 refills | Status: DC

## 2022-09-04 MED ORDER — URO-MP 118 MG PO CAPS: ORAL_CAPSULE | ORAL | 0 refills | Status: AC

## 2022-09-07 ENCOUNTER — Ambulatory Visit
Admission: RE | Admit: 2022-09-07 | Discharge: 2022-09-07 | Disposition: A | Discharge status: Home / Self Care | Payer: 59 | Source: Ambulatory Visit | Attending: Plastic Surgery | Admitting: Plastic Surgery

## 2022-09-07 ENCOUNTER — Other Ambulatory Visit: Payer: Self-pay | Admitting: Plastic Surgery

## 2022-09-07 DIAGNOSIS — N644 Mastodynia: Secondary | ICD-10-CM

## 2022-09-08 LAB — CULTURE, URINE COMPREHENSIVE

## 2022-09-28 ENCOUNTER — Telehealth: Payer: Self-pay | Admitting: Student in an Organized Health Care Education/Training Program

## 2022-09-28 NOTE — Telephone Encounter (Signed)
PT stated that she feel like pharmacy is miss counting her pills. PT stated that she only has one pill left and can't get another refill until the 8th. PT stated that she called pharmacy about the pill count and was told that she was giving the correct pill count. PT stated that she been on Oxycodone for a while and she takes her medication on time and as she is supposed to take it. Please give patient a call. TY

## 2022-10-01 ENCOUNTER — Other Ambulatory Visit: Payer: Self-pay | Admitting: *Deleted

## 2022-10-01 ENCOUNTER — Telehealth: Payer: Self-pay | Admitting: Student in an Organized Health Care Education/Training Program

## 2022-10-01 ENCOUNTER — Telehealth: Payer: Self-pay | Admitting: *Deleted

## 2022-10-01 DIAGNOSIS — M7918 Myalgia, other site: Secondary | ICD-10-CM

## 2022-10-01 DIAGNOSIS — M5030 Other cervical disc degeneration, unspecified cervical region: Secondary | ICD-10-CM

## 2022-10-01 DIAGNOSIS — Z9889 Other specified postprocedural states: Secondary | ICD-10-CM

## 2022-10-01 DIAGNOSIS — M47812 Spondylosis without myelopathy or radiculopathy, cervical region: Secondary | ICD-10-CM

## 2022-10-01 DIAGNOSIS — G894 Chronic pain syndrome: Secondary | ICD-10-CM

## 2022-10-01 MED ORDER — GABAPENTIN 600 MG PO TABS: 600.0000 mg | ORAL_TABLET | Freq: Three times a day (TID) | ORAL | 5 refills | Status: DC

## 2022-10-01 MED ORDER — OXYCODONE HCL 5 MG PO TABS: 5.0000 mg | ORAL_TABLET | Freq: Four times a day (QID) | ORAL | 0 refills | Status: AC | PRN

## 2022-10-01 MED ORDER — OXYCODONE HCL 5 MG PO TABS: 5.0000 mg | ORAL_TABLET | Freq: Four times a day (QID) | ORAL | 0 refills | Status: DC | PRN

## 2022-10-01 NOTE — Telephone Encounter (Signed)
Cancelled prescriptions for Oxycodone for 10-05-22 and 4-7-242 and Gabapentin at [facility] on [address], Dr. Holley Raring resent them to [place] on [address]. Patient notified.

## 2022-10-01 NOTE — Telephone Encounter (Signed)
Patient wishes to transfer all her prescriptions to another pharmacy. Rx request sent to Dr. Holley Raring.

## 2022-10-01 NOTE — Telephone Encounter (Signed)
Discussed with Dr. Holley Raring, no further action. Advised patient to call the pharmacy and voice her concerns.

## 2022-10-01 NOTE — Telephone Encounter (Signed)
Please cancel Rx at old pharmacy and document

## 2022-10-01 NOTE — Telephone Encounter (Signed)
PT stated that she hasn't had any Oxycodone since Friday. PT stated that she is starting to have cold chills and shakes. PT wants to know what can she do. Please give patient a call. TY

## 2022-10-03 ENCOUNTER — Other Ambulatory Visit: Payer: Self-pay | Admitting: Urology

## 2022-10-03 DIAGNOSIS — R300 Dysuria: Secondary | ICD-10-CM

## 2022-10-11 ENCOUNTER — Other Ambulatory Visit: Payer: Self-pay | Admitting: Nephrology

## 2022-10-11 DIAGNOSIS — N1831 Chronic kidney disease, stage 3a: Secondary | ICD-10-CM

## 2022-10-11 DIAGNOSIS — R8290 Unspecified abnormal findings in urine: Secondary | ICD-10-CM

## 2022-10-25 ENCOUNTER — Ambulatory Visit: Payer: 59

## 2022-10-28 NOTE — Progress Notes (Unsigned)
10/29/2022 5:11 PM   Kimberly [name] [birth year] [id number]  Referring provider: Dion Body, MD [facility] [place],  [place] [address]  Urological history: 1.  High risk hematuria -Non-smoker -CTU (08/25/2021) - Punctuate nonobstructive calculus of the superior pole of the left kidney -cysto/hydro distention w/ bilateral RTG's (08/2021) - Post dilation glomerulations were noted lower lateral and posterior walls/trigone -contrast CT (03/2022) - Mild urothelial enhancement of right ureter - urine culture positive for E.coli -no reports of gross heme -UA ***  2. Interstitial cystitis -cysto/hydrodistention in February 2023 which noted post dilation glomerulations -Uribel, amitriptyline 25 mg and hydroxyzine 10 mg TID, prn   3.  Nephrolithiasis -CTU (07/2021) - Punctuate nonobstructive calculus of the superior pole of the left kidney  No chief complaint on file.   HPI: Kimberly [name] is a 62 y.o. female who presents today for frequent urination and blood in the urine.   At her visit on June 06, 2022, she states she has been having dysuria and vaginal pains off-and-on for the last 3 weeks.  She has noticed a yellow vaginal discharge.  Uribel seems to help.  UA yellow slightly cloudy, trace ketone, specific gravity 1.015, trace blood, pH 7.5, trace protein, 3-10 RBCs, greater than 10 epithelial cells and many bacteria and presence of yeast.  PVR 0 mL.  Urine culture mixed urogenital flora.  She was started on Diflucan and Septra.  At her visit on July 11, 2022, she continues to have vaginal pain.  She states that it is not relieved by urination.  She denies any vaginal discharge or blood on the toilet paper when she wipes.  She also denies any issues with constipation, diarrhea or bloody stool.  She has not been taking her amitriptyline or hydroxyzine.  UA green cloudy, 1+ ketone, trace blood, pH 6.0, 2+ protein, 0-5 WBCs, 3-10 RBCs,  greater than 10 epithelial cells, granular casts present, hyaline cast present and many bacteria.  We restarted her amitriptyline hydroxylate at that appointment.  She called back a few days later stating that she discontinued her amitriptyline and hydroxyzine because it was making her tongue burn after drinking and eating.  She went back on the Uribel.  On the morning of her return appointment on January 29, she called [place] from the emergency department where she was with her husband stating that she was having severe pain and wanted to be seen before her afternoon appointment.  I did not have a morning appointment available for her that day, so she stayed in the emergency room and sought treatment there.  Wet prep performed in the ED noted the presence of yeast and she was given Diflucan.  At her visit on 09/04/2022, she states that she still has constant pain in her vaginal wall.  She is completed a course of Diflucan and now is taking over-the-counter Monistat which provides some relief.  She denied any vaginal discharge.  She also states that she is having urinary urgency and dysuria.  1 capsule of Uribel seems to control her dysuria for almost 24 hours.  She also states that she feels bloated, but she denies any diarrhea or constipation.  Patient denies any modifying or aggravating factors.  Patient denies any gross hematuria, dysuria or suprapubic/flank pain.  Patient denies any fevers, chills, nausea or vomiting.  CATH UA yellow clear, trace ketone, specific gravity 1.015, trace blood, pH 7.0, 1+ protein, 0-5 WBCs, 3-10 RBCs, greater than 10 epithelial cells, hyaline  and casts present, RBC casts present, mucus threads are present and a few bacteria.  Urine culture was negative.  She was also referred to nephrology.  She felt that the cystoscopy hydrodistention and bilateral retrogrades performed in February did not provide any symptom relief.  She was seen by nephrology on 10/09/2022.  She has been  diagnosed with CKD IIIa.    She contacted her PCP on 10/24/2022 with symptoms of frequency, pain, pressure and malodorous urine.  Urinalysis was orange turbid, specific gravity 1.023, pH 6.0, 1+ protein, 2+ blood, 63 RBC's and 4 squames.  Urine culture MUF.    PMH: Past Medical History:  Diagnosis Date   Anxiety    takes alprazolam - rare use    Arthritis    cervical spondylosis    Asthma    Balance problem    Brain cyst    Carpal tunnel syndrome, bilateral    Cervical fusion syndrome    Complete rupture of rotator cuff    Complication of anesthesia    lung collapsed after neck fusion   Degenerative disc disease, cervical    Depression    Difficult intubation    Small mouth opening, limited neck flexion, very anterior    Flu 08/10/2018   tested positive   GERD (gastroesophageal reflux disease)    pt. reports that its better, no meds in use at this time- 2015   Headache    Herpes    History of kidney stones    History of pneumonia    Hyperlipidemia    Hypertension    pt. doesn't see cardiologist, followed for HTN by Dr. Gerarda Fraction   Hypothyroidism    Inflammation of shoulder joint    Memory difficulties    Neuromuscular disorder ([place])    joint and muscle problems   Occipital neuralgia    Pneumothorax on right    following C4-6 ACDF 07/04/16   Pseudoarthrosis of cervical spine (HCC)    Recurrent falls    Syncope    Urinary frequency     Surgical History: Past Surgical History:  Procedure Laterality Date   ABDOMINAL HYSTERECTOMY     ANTERIOR CERVICAL DECOMP/DISCECTOMY FUSION N/A 08/06/2014   Procedure: ANTERIOR CERVICAL DECOMPRESSION/DISCECTOMY FUSION CERVICAL THREE-FOUR,CERVICAL SIX-SEVEN ,CERVICAL SEVEN-THORACIC ONE;  Surgeon: Floyce Stakes, MD;  Location: [place];  Service: Neurosurgery;  Laterality: N/A;   ANTERIOR CERVICAL DECOMP/DISCECTOMY FUSION N/A 07/04/2016   Procedure: CERVICAL FOUR-FIVE, CERVICAL FIVE-SIX ANTERIOR CERVICAL DECOMPRESSION/DISCECTOMY/FUSION  WITH REVISION OF CERVICAL THREE-FOUR PLATE;  Surgeon: Leeroy Cha, MD;  Location: [place];  Service: Neurosurgery;  Laterality: N/A;   CARPAL TUNNEL RELEASE Left 02/16/2013   Procedure: CARPAL TUNNEL RELEASE;  Surgeon: Carole Civil, MD;  Location: [facility];  Service: Orthopedics;  Laterality: Left;   CARPAL TUNNEL RELEASE Right 03/27/2013   Procedure: RIGHT CARPAL TUNNEL RELEASE;  Surgeon: Carole Civil, MD;  Location: [facility];  Service: Orthopedics;  Laterality: Right;   CESAREAN SECTION     x2   CHOLECYSTECTOMY N/A 06/17/2015   Procedure: LAPAROSCOPIC CHOLECYSTECTOMY;  Surgeon: Aviva Signs, MD;  Location: [facility];  Service: General;  Laterality: N/A;   COLONOSCOPY     COLONOSCOPY WITH PROPOFOL N/A 05/06/2019   Procedure: COLONOSCOPY WITH PROPOFOL;  Surgeon: Robert Bellow, MD;  Location: [facility];  Service: Endoscopy;  Laterality: N/A;   CYSTO WITH HYDRODISTENSION N/A 09/26/2021   Procedure: CYSTOSCOPY/HYDRODISTENSION;  Surgeon: Abbie Sons, MD;  Location: [facility];  Service: Urology;  Laterality: N/A;   CYSTOSCOPY W/  RETROGRADES Bilateral 09/26/2021   Procedure: CYSTOSCOPY WITH RETROGRADE PYELOGRAM;  Surgeon: Abbie Sons, MD;  Location: [facility];  Service: Urology;  Laterality: Bilateral;   CYSTOSCOPY WITH BIOPSY N/A 09/26/2021   Procedure: CYSTOSCOPY WITH BIOPSY;  Surgeon: Abbie Sons, MD;  Location: [facility];  Service: Urology;  Laterality: N/A;   ESOPHAGOGASTRODUODENOSCOPY N/A 05/08/2021   Procedure: ESOPHAGOGASTRODUODENOSCOPY (EGD);  Surgeon: Lesly Rubenstein, MD;  [facility];  Service: Endoscopy;  Laterality: N/A;   ESOPHAGOGASTRODUODENOSCOPY  11/25/2017   EXCISION VAGINAL CYST N/A 12/22/2021   Procedure: EXCISION VULVAR CYST;  Surgeon: Benjaman Kindler, MD;  Location: [facility];  Service: Gynecology;  Laterality: N/A;   EXTRACORPOREAL SHOCK WAVE LITHOTRIPSY Left 09/04/2018   Procedure: EXTRACORPOREAL SHOCK WAVE LITHOTRIPSY (ESWL);   Surgeon: Billey Co, MD;  Location: [facility];  Service: Urology;  Laterality: Left;   FOREIGN BODY REMOVAL Left    knee-as child   KNEE ARTHROSCOPY     NASAL SINUS SURGERY N/A 04/13/2015   Procedure: nasal endoscopy with adenoid biopsy;  Surgeon: Ruby Cola, MD;  Location: [place];  Service: ENT;  Laterality: N/A;   NECK SURGERY  2016   x 3 all together   POSTERIOR CERVICAL FUSION/FORAMINOTOMY N/A 11/13/2016   Procedure: CERVICAL TWO-CERVICAL SIX POSTERIOR CERVICAL FUSION WITH LATERAL MASS FIXATION;  Surgeon: Kristeen Miss, MD;  Location: [place];  Service: Neurosurgery;  Laterality: N/A;  posterior approach   REDUCTION MAMMAPLASTY  10/2019   ROTATOR CUFF REPAIR Left 2014   SHOULDER ACROMIOPLASTY Left 05/18/2015   Procedure: SHOULDER ACROMIOPLASTY;  Surgeon: Earlie Server, MD;  Location: [facility];  Service: Orthopedics;  Laterality: Left;   SHOULDER ARTHROSCOPY WITH DISTAL CLAVICLE RESECTION Left 05/18/2015   Procedure: LEFT SHOULDER ARTHROSCOPY WITH  DISTAL CLAVICLE RESECTION;  Surgeon: Earlie Server, MD;  Location: [employer];  Service: Orthopedics;  Laterality: Left;   URETEROSCOPY Bilateral 09/26/2021   Procedure: URETEROSCOPY;  Surgeon: Abbie Sons, MD;  Location: [facility];  Service: Urology;  Laterality: Bilateral;   XI ROBOTIC ASSISTED OOPHORECTOMY N/A 12/22/2021   Procedure: XI ROBOTIC ASSISTED DIAGNOSTIC LAPAROSCOPY, LEFT OOPHORECTOMY, WITH RIGHT SALPINGECTOMY AND LYSIS OF ADHESIONS;  Surgeon: Benjaman Kindler, MD;  Location: [facility];  Service: Gynecology;  Laterality: N/A;    Home Medications:  Allergies as of 10/29/2022       Reactions   Shellfish Allergy Anaphylaxis, Hives   "Swells throat up"   Peanut-containing Drug Products Hives   Codeine Other (See Comments)   jitters   Diphtheria Toxoid Rash   Tetanus Toxoids Rash        Medication List        Accurate as of October 28, 2022  5:11 PM. If you have any questions, ask your  nurse or doctor.          acetaminophen 500 MG tablet Commonly known as: TYLENOL Take 500 mg by mouth 2 (two) times daily as needed for headache.   amLODipine 5 MG tablet Commonly known as: NORVASC Take 5 mg by mouth daily.   cholecalciferol 25 MCG (1000 UNIT) tablet Commonly known as: VITAMIN D3 Take 1,000 Units by mouth daily.   cyclobenzaprine 10 MG tablet Commonly known as: FLEXERIL Take 10 mg by mouth 3 (three) times daily as needed for muscle spasms.   estradiol 1 MG tablet Commonly known as: ESTRACE Take 1.5 mg by mouth daily.   fluconazole 100 MG tablet Commonly known as: DIFLUCAN Take 100 mg by mouth daily.  gabapentin 600 MG tablet Commonly known as: Neurontin Take 1 tablet (600 mg total) by mouth 3 (three) times daily.   hydrOXYzine 10 MG tablet Commonly known as: ATARAX Take 1 tablet (10 mg total) by mouth 3 (three) times daily as needed.   liothyronine 5 MCG tablet Commonly known as: CYTOMEL Take 5 mcg by mouth daily.   meloxicam 15 MG tablet Commonly known as: MOBIC Take 15 mg by mouth daily as needed for pain.   NONFORMULARY OR COMPOUNDED ITEM Place 10 mg vaginally at bedtime as needed (spasms). Diazepam 10 mg   oxyCODONE 5 MG immediate release tablet Commonly known as: Oxy IR/ROXICODONE Take 1 tablet (5 mg total) by mouth every 6 (six) hours as needed for severe pain. Must last 30 days.   oxyCODONE 5 MG immediate release tablet Commonly known as: Oxy IR/ROXICODONE Take 1 tablet (5 mg total) by mouth every 6 (six) hours as needed for severe pain. Must last 30 days. Start taking on: November 04, 2022   pantoprazole 40 MG tablet Commonly known as: PROTONIX Take 40 mg by mouth every morning.   Premarin vaginal cream Generic drug: conjugated estrogens Apply 0.5mg  (pea-sized amount)  just inside the vaginal introitus with a finger-tip on  Monday, Wednesday and Friday nights.   rizatriptan 10 MG tablet Commonly known as: MAXALT Take 10 mg by  mouth as needed for migraine. May repeat in 2 hours if needed   Uro-MP 118 MG Caps Take one every 6 hours prn for dysuria   valACYclovir 1000 MG tablet Commonly known as: VALTREX Take 1,000 mg by mouth daily.   VISINE OP Apply 1 drop to eye daily as needed (irritation).        Allergies:  Allergies  Allergen Reactions   Shellfish Allergy Anaphylaxis and Hives    "Swells throat up"   Peanut-Containing Drug Products Hives   Codeine Other (See Comments)    jitters   Diphtheria Toxoid Rash   Tetanus Toxoids Rash    Family History: Family History  Problem Relation Age of Onset   Cancer Father        prostate cancer   Hypertension Father    Hypertension Other    Breast cancer Neg Hx     Social History:  reports that she has never smoked. She has never used smokeless tobacco. She reports current alcohol use. She reports that she does not use drugs.  ROS: Pertinent ROS in HPI  Physical Exam: There were no vitals taken for this visit.  Constitutional:  Well nourished. Alert and oriented, No acute distress. HEENT: [place] AT, moist mucus membranes.  Trachea midline Cardiovascular: No clubbing, cyanosis, or edema. Respiratory: Normal respiratory effort, no increased work of breathing. GU: No CVA tenderness.  No bladder fullness or masses. Vulvovaginal atrophy w/ pallor, loss of rugae and introital retraction.  Anus and perineum are without rashes or lesions.     Neurologic: Grossly intact, no focal deficits, moving all 4 extremities. Psychiatric: Normal mood and affect.    Laboratory Data: Serum creatinine (07/2022) 1.2 Urinalysis See EPIC and HPI I have reviewed the labs.   Pertinent Imaging: N/A  In and Out Catheterization  Patient is present today for a I & O catheterization due to pain. Patient was cleaned and prepped in a sterile fashion with betadine . A 14 FR cath was inserted no complications were noted , 50 ml of urine return was noted, urine was yellow in  color. A clean urine sample was collected for  urinalysis and urine culture. Bladder was drained  And catheter was removed with out difficulty.    Performed by: Zara Council, PA-C and Kyra Manges, CMA   Assessment & Plan:    1. Painful urination -Likely secondary to IC -CATH UA w/ micro heme w/RBC's casts -urine culture is sent -Uribel sent for pain  2. Microscopic hematuria -UA w/ 3-10 RBC's  -Will recheck UA with a catheterized specimen when she returns in 6 weeks  3. Vaginal pain -start vaginal estrogen cream on Monday, Wednesday and Friday -referred to gynecology for further evaluation and management  4. RBC casts -present in today's UA -referral to nephrology for further evaluation and management   No follow-ups on file.  These notes generated with voice recognition software. I apologize for typographical errors.  [place], [place] [address]  [place], [place] [address] [phone]

## 2022-10-29 ENCOUNTER — Encounter: Payer: Self-pay | Admitting: Urology

## 2022-10-29 ENCOUNTER — Ambulatory Visit (INDEPENDENT_AMBULATORY_CARE_PROVIDER_SITE_OTHER): Payer: 59 | Admitting: Urology

## 2022-10-29 VITALS — BP 106/70 | HR 96 | Ht 61.0 in | Wt 147.0 lb

## 2022-10-29 DIAGNOSIS — R3129 Other microscopic hematuria: Secondary | ICD-10-CM | POA: Diagnosis not present

## 2022-10-29 DIAGNOSIS — R350 Frequency of micturition: Secondary | ICD-10-CM | POA: Diagnosis not present

## 2022-10-29 DIAGNOSIS — N952 Postmenopausal atrophic vaginitis: Secondary | ICD-10-CM | POA: Diagnosis not present

## 2022-10-29 LAB — URINALYSIS, COMPLETE
Bilirubin, UA: NEGATIVE
Glucose, UA: NEGATIVE
Ketones, UA: NEGATIVE
Leukocytes,UA: NEGATIVE
Nitrite, UA: NEGATIVE
Specific Gravity, UA: 1.025 (ref 1.005–1.030)
Urobilinogen, Ur: 0.2 mg/dL (ref 0.2–1.0)
pH, UA: 5.5 (ref 5.0–7.5)

## 2022-10-29 LAB — MICROSCOPIC EXAMINATION: Epithelial Cells (non renal): >10 /hpf — AB (ref 0–10)

## 2022-11-01 IMAGING — MR MR CERVICAL SPINE W/O CM
5 series · 41 of 48 positions shown · non-contrast
Comparison: MRI of the cervical spine May 07, 2016.

CLINICAL DATA: Neck pain.

EXAM:
MRI CERVICAL SPINE WITHOUT CONTRAST
TECHNIQUE: Multiplanar, multisequence MR imaging of the cervical spine was
performed. No intravenous contrast was administered.

[Series 3: T2 · sagittal · 3.0mm · 0.69mm/px · 6 of 13 slices shown (1 of 2)]
[im 1/13]
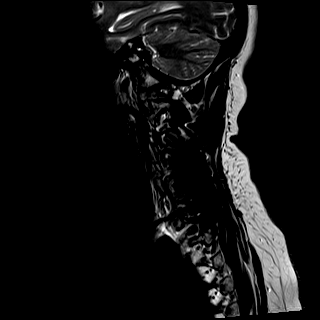
[im 3/13]
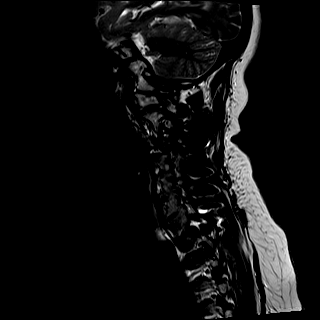
[im 5/13]
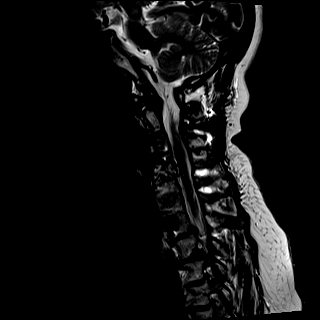
[im 8/13]
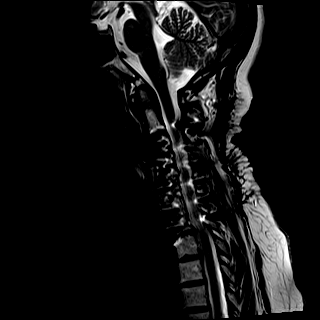
[im 10/13]
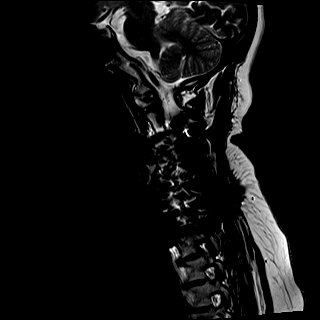
[im 13/13]
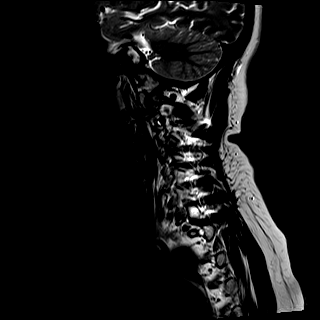

[Series 4: T1 · sagittal · 3.0mm · 0.86mm/px · 7 of 13 slices shown]
[im 1/13]
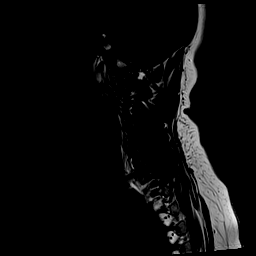
[im 3/13]
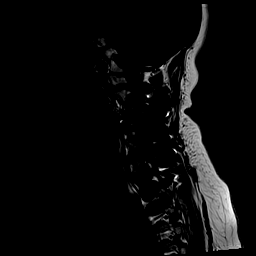
[im 5/13]
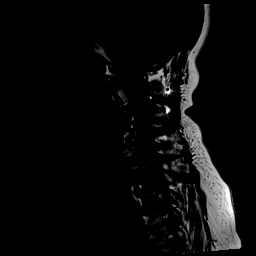
[im 7/13]
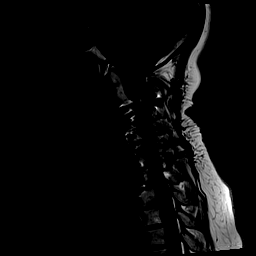
[im 9/13]
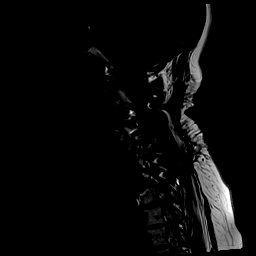
[im 11/13]
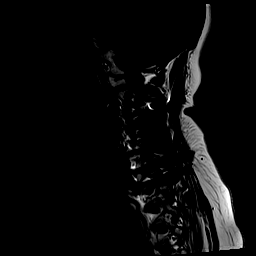
[im 13/13]
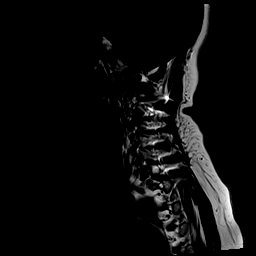

[Series 5: STIR · sagittal · 3.0mm · 0.69mm/px · 7 of 13 slices shown]
[im 1/13]
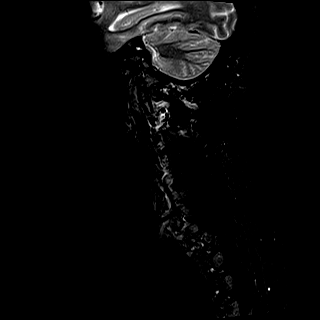
[im 3/13]
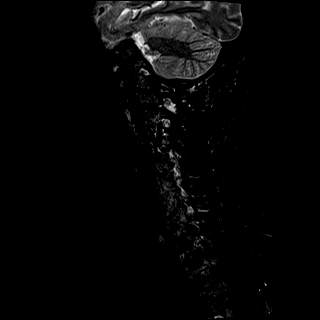
[im 5/13]
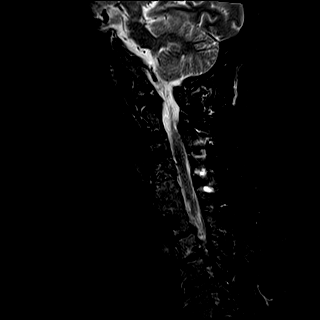
[im 7/13]
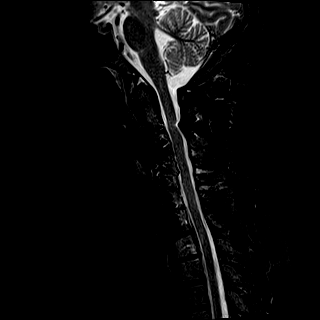
[im 9/13]
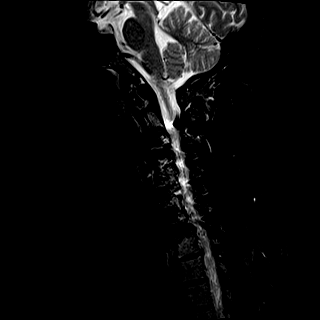
[im 11/13]
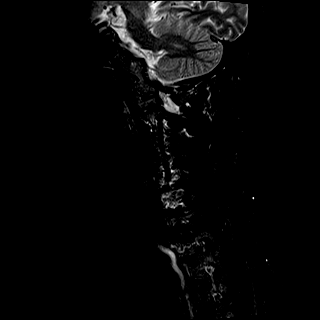
[im 13/13]
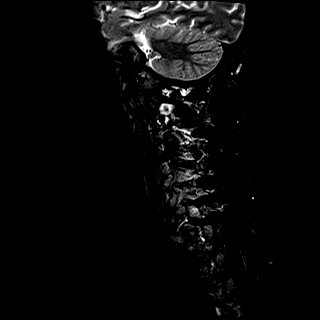

[Series 6: T2 · axial · 3.0mm · 0.62mm/px · z∈[-82,+20]mm · 13 of 28 slices shown (2 of 2)]
[im 1/28]
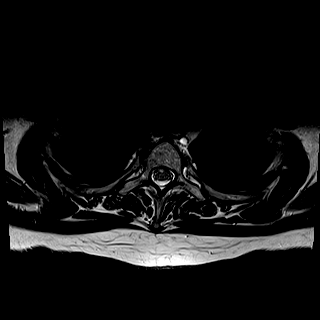
[im 3/28]
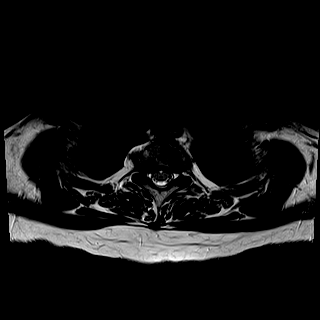
[im 5/28]
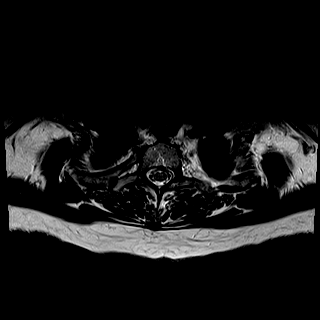
[im 7/28]
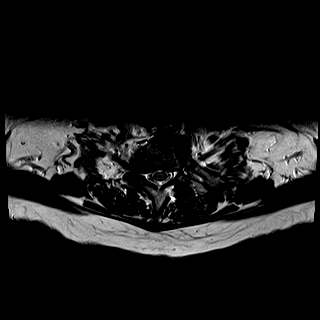
[im 9/28]
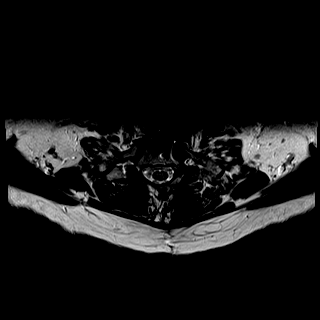
[im 11/28]
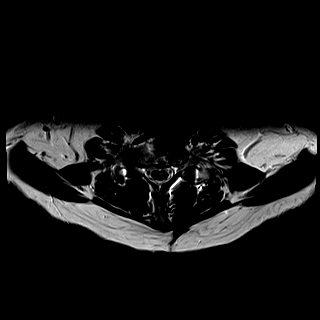
[im 13/28]
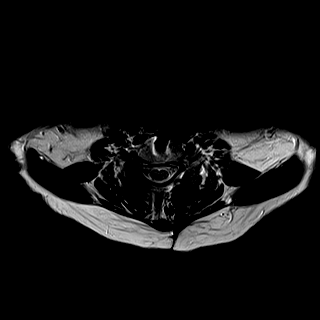
[im 15/28]
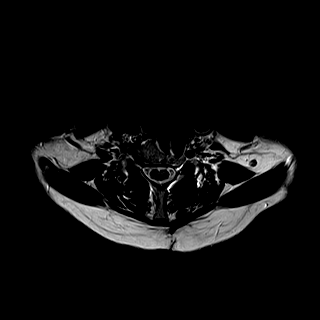
[im 17/28]
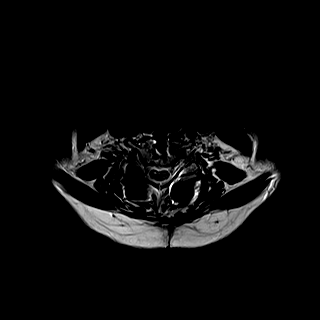
[im 19/28]
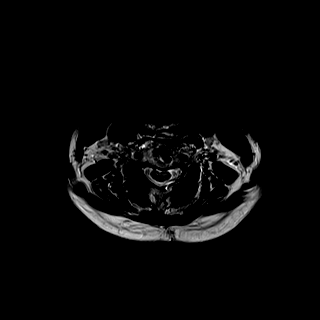
[im 21/28]
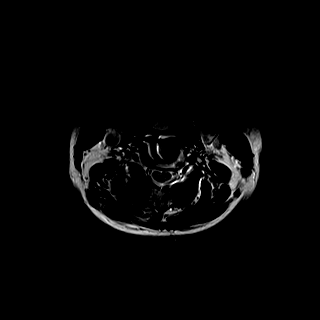
[im 23/28]
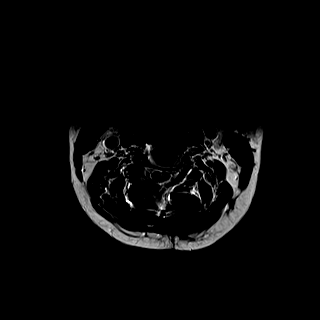
[im 28/28]
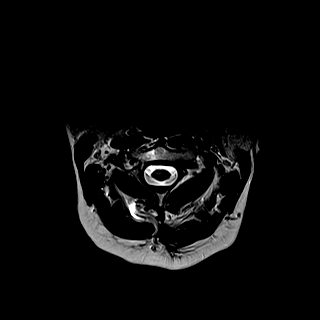

[Series 7: mpgr ax · axial · 3.0mm · 0.35mm/px · z∈[-73,+29]mm · 8 of 28 slices shown]
[im 1/28]
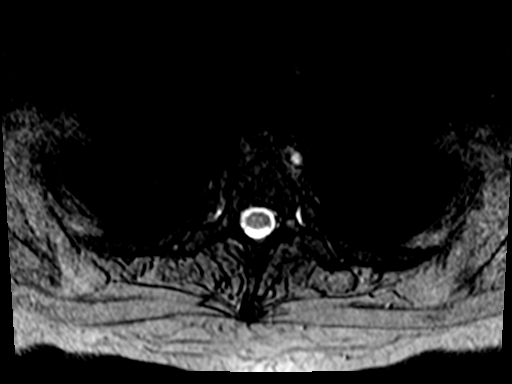
[im 5/28]
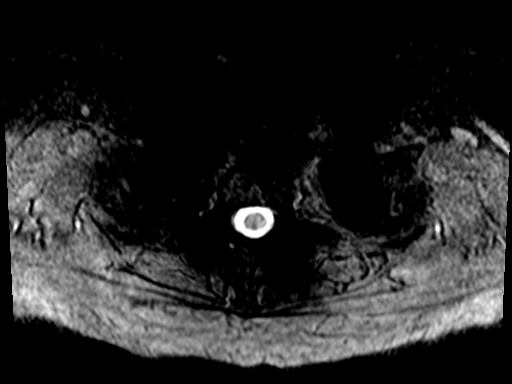
[im 9/28]
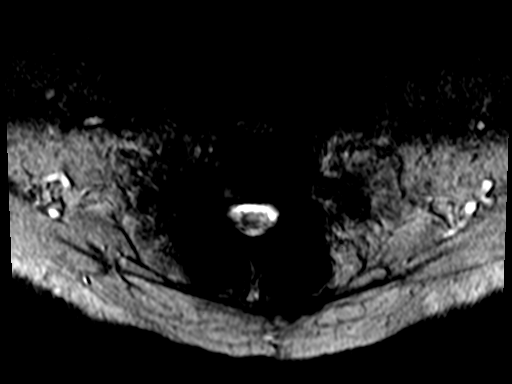
[im 13/28]
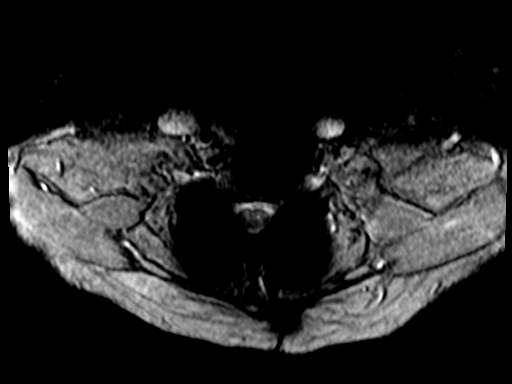
[im 15/28]
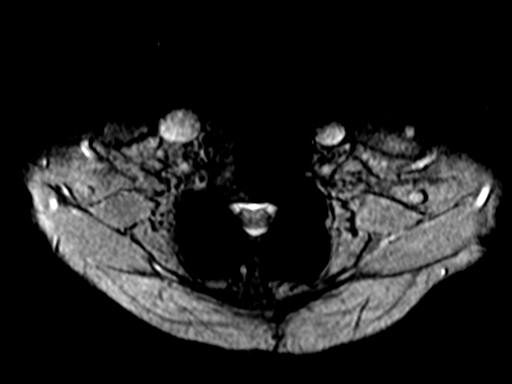
[im 19/28]
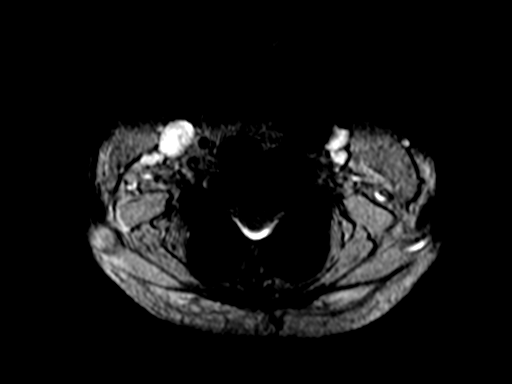
[im 23/28]
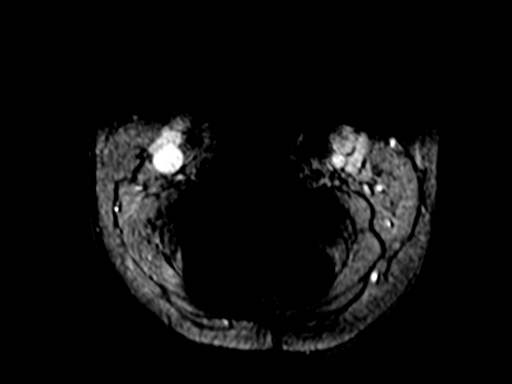
[im 28/28]
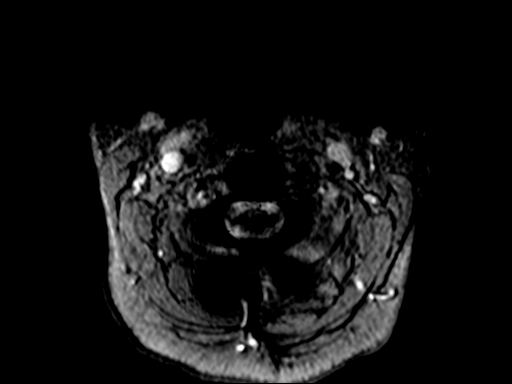

[41 of 48 positions shown; findings below may reference images not displayed]

FINDINGS: Alignment: Straightening of the cervical curvature.

Vertebrae: Congenital fusion of C2-3. Status post ACDF at C3-4 and
C[DATE]-T1. Posterior fixation from C3 through T1. Hardware results in
susceptibility artifact limiting evaluation the adjacent structures.
No appreciable fracture, evidence of discitis or bone lesion.

Cord: Normal signal and morphology.

Posterior Fossa, vertebral arteries, paraspinal tissues: Negative.

Disc levels:

C2-3: No spinal canal or neural foraminal stenose.

C3-4: No spinal canal or neural foraminal stenosis.

C4-5: No spinal canal stenosis. Uncovertebral and facet degenerative
changes resulting moderate left neural foraminal narrowing.

C5-6: No spinal canal or neural foraminal stenosis.

C6-7: No spinal canal stenosis. Uncovertebral and facet degenerative
changes resulting in moderate bilateral neural foraminal.

C7-T1: No spinal canal stenosis. Uncovertebral and facet
degenerative changes resulting in moderate bilateral neural
foraminal narrowing.

No significant change in the degree of neural foraminal stenosis
compared to prior MRI.
IMPRESSION: 1. Congenital fusion of C2-3.
2. Status post ACDF at C3-4 and C6-7 and posterior fixation from C3
through T1.
3. No spinal canal stenosis.
4. Moderate neural foraminal narrowing on the left at C4-5,
bilaterally at C6-7 and C7-T1.

## 2022-11-02 ENCOUNTER — Ambulatory Visit
Admission: RE | Admit: 2022-11-02 | Discharge: 2022-11-02 | Disposition: A | Discharge status: Home / Self Care | Payer: 59 | Source: Ambulatory Visit | Attending: Nephrology | Admitting: Nephrology

## 2022-11-02 DIAGNOSIS — R8290 Unspecified abnormal findings in urine: Secondary | ICD-10-CM | POA: Insufficient documentation

## 2022-11-02 DIAGNOSIS — N1831 Chronic kidney disease, stage 3a: Secondary | ICD-10-CM | POA: Diagnosis present

## 2022-11-02 LAB — CULTURE, URINE COMPREHENSIVE

## 2022-11-05 LAB — MYCOPLASMA / UREAPLASMA CULTURE
Mycoplasma hominis Culture: NEGATIVE
Ureaplasma urealyticum: NEGATIVE

## 2022-11-23 ENCOUNTER — Telehealth: Payer: Self-pay | Admitting: Family Medicine

## 2022-11-23 NOTE — Telephone Encounter (Signed)
Patient notified and appointment made

## 2022-11-23 NOTE — Telephone Encounter (Signed)
-----   Message from Harle Battiest, PA-C sent at 11/23/2022 10:20 AM EDT ----- Please let Kimberly [name] know that her ultrasound of her kidneys did not show any reason for the blood in her urine and her cultures are negative.  Because she had > 50 RBC's on an urinalysis in March, she should have a cystoscopy with Dr. Lonna Cobb to evaluate the inside of her bladder.

## 2022-11-29 ENCOUNTER — Ambulatory Visit
Payer: 59 | Attending: Student in an Organized Health Care Education/Training Program | Admitting: Student in an Organized Health Care Education/Training Program

## 2022-11-29 ENCOUNTER — Encounter: Payer: Self-pay | Admitting: Student in an Organized Health Care Education/Training Program

## 2022-11-29 DIAGNOSIS — Z9889 Other specified postprocedural states: Secondary | ICD-10-CM | POA: Diagnosis present

## 2022-11-29 DIAGNOSIS — M47812 Spondylosis without myelopathy or radiculopathy, cervical region: Secondary | ICD-10-CM

## 2022-11-29 DIAGNOSIS — M5030 Other cervical disc degeneration, unspecified cervical region: Secondary | ICD-10-CM | POA: Diagnosis present

## 2022-11-29 DIAGNOSIS — M7918 Myalgia, other site: Secondary | ICD-10-CM | POA: Diagnosis present

## 2022-11-29 DIAGNOSIS — G894 Chronic pain syndrome: Secondary | ICD-10-CM | POA: Diagnosis present

## 2022-11-29 MED ORDER — OXYCODONE HCL 5 MG PO TABS: 5.0000 mg | ORAL_TABLET | Freq: Four times a day (QID) | ORAL | 0 refills | Status: DC | PRN

## 2022-11-29 MED ORDER — OXYCODONE HCL 5 MG PO TABS: 5.0000 mg | ORAL_TABLET | Freq: Four times a day (QID) | ORAL | 0 refills | Status: AC | PRN

## 2022-11-29 MED ORDER — GABAPENTIN 600 MG PO TABS
600.0000 mg | ORAL_TABLET | Freq: Three times a day (TID) | ORAL | 5 refills | Status: DC
Start: 2022-11-29 — End: 2023-08-29

## 2022-11-29 NOTE — Progress Notes (Signed)
Nursing Pain Medication Assessment:  Safety precautions to be maintained throughout the outpatient stay will include: orient to surroundings, keep bed in low position, maintain call bell within reach at all times, provide assistance with transfer out of bed and ambulation.  Medication Inspection Compliance: Pill count conducted under aseptic conditions, in front of the patient. Neither the pills nor the bottle was removed from the patient's sight at any time. Once count was completed pills were immediately returned to the patient in their original bottle.  Medication: Oxycodone IR Pill/Patch Count:  1 20 Pill/Patch Appearance: Markings consistent with prescribed medication Bottle Appearance: Standard pharmacy container. Clearly labeled. Filled Date: 4 / 8 / 2024 Last Medication intake:  TodaySafety precautions to be maintained throughout the outpatient stay will include: orient to surroundings, keep bed in low position, maintain call bell within reach at all times, provide assistance with transfer out of bed and ambulation.

## 2022-11-29 NOTE — Progress Notes (Signed)
PROVIDER NOTE: Information contained herein reflects review and annotations entered in association with encounter. Interpretation of such information and data should be left to medically-trained personnel. Information provided to patient can be located elsewhere in the medical record under "Patient Instructions". Document created using STT-dictation technology, any transcriptional errors that may result from process are unintentional.    Patient: Kimberly [name]  Service Category: E/M  Provider: Edward Jolly, MD  DOB: 02-13-1961  DOS: 11/29/2022  Specialty: Interventional Pain Management  MRN: [id number]  Setting: Ambulatory outpatient  PCP: Marisue Ivan, MD  Type: Established Patient    Referring Provider: Marisue Ivan, MD  Location: Office  Delivery: Face-to-face     HPI  Kimberly [name], a 62 y.o. year old female, is here today because of her No primary diagnosis found.. Kimberly [name] primary complain today is Headache (Back of head)  Last encounter: My last encounter with her was on 08/28/22  Pertinent problems: Kimberly [name] has SHOULDER PAIN; IMPINGEMENT SYNDROME; RUPTURE ROTATOR CUFF; CTS (carpal tunnel syndrome); Cervical spondylosis without myelopathy; Cervical stenosis of spinal canal; Pseudoarthrosis of cervical spine (HCC); Numbness and tingling; Vitamin D deficiency; Cervical fusion syndrome; Hx of cervical spine surgery; Chronic pain syndrome; Cervicalgia; Controlled substance agreement signed; Depression, major, single episode, complete remission (HCC); Cervical myofascial pain syndrome; and Occipital headache on their pertinent problem list. Pain Assessment: Severity of Chronic pain is reported as a 7 /10. Location: Head  /radiates to neck. Onset: More than a month ago. Quality: Constant, Aching, Pressure. Timing: Constant. Modifying factor(s): lay down, meds. Vitals:  height is 5\' 1"  (1.549 m) and weight is 146 lb (66.2 kg). Her temporal temperature is 97.4 F (36.3 C)  (abnormal). Her blood pressure is 122/91 (abnormal) and her pulse is 97. Her oxygen saturation is 99%.   Reason for encounter: medication management.    No change in medical history since last visit.  Patient's pain is at baseline.  Patient continues multimodal pain regimen as prescribed.  States that it provides pain relief and improvement in functional status. Chronic occipital pain, states that she will follow up with her surgeon    Pharmacotherapy Assessment  Analgesic: Oxycodone 5mg  every 6hours as needed, quantity 120/month; MME equals 30    Monitoring: [place] PMP: PDMP reviewed during this encounter.       Pharmacotherapy: No side-effects or adverse reactions reported. Compliance: No problems identified. Effectiveness: Clinically acceptable.  UDS:  Summary  Date Value Ref Range Status  02/13/2022 Note  Final    Comment:    ==================================================================== [employer] 13 (MW) ==================================================================== Test                             Result       Flag       Units  Drug Present and Declared for Prescription Verification   Oxycodone                      3605         EXPECTED   ng/mg creat   Oxymorphone                    3670         EXPECTED   ng/mg creat   Noroxycodone                   2769         EXPECTED  ng/mg creat   Noroxymorphone                 1088         EXPECTED   ng/mg creat    Sources of oxycodone are scheduled prescription medications.    Oxymorphone, noroxycodone, and noroxymorphone are expected    metabolites of oxycodone. Oxymorphone is also available as a    scheduled prescription medication.  Drug Absent but Declared for Prescription Verification   Diazepam                       Not Detected UNEXPECTED ng/mg creat ==================================================================== Test                      Result    Flag   Units      Ref Range   Creatinine               119              mg/dL      >=16 ==================================================================== Declared Medications:  The flagging and interpretation on this report are based on the  following declared medications.  Unexpected results may arise from  inaccuracies in the declared medications.   **Note: The testing scope of this panel includes these medications:   Diazepam  Oxycodone   **Note: The testing scope of this panel does not include the  following reported medications:   Acetaminophen (Tylenol)  Amitriptyline (Elavil)  Amlodipine (Norvasc)  Estradiol (Estrace)  Eye Drop  Gabapentin (Neurontin)  Hydroxyzine (Atarax)  Hyoscyamine (Uribel)  Meloxicam (Mobic)  Methenamine (Uribel)  Methylene Blue (Uribel)  Pantoprazole (Protonix)  Phenyl salicylate (Uribel)  Rizatriptan (Maxalt)  Sodium phosphate, monobasic (Uribel)  Valacyclovir (Valtrex)  Vitamin D3 ==================================================================== For clinical consultation, please call [phone]. ====================================================================       ROS  Constitutional: Denies any fever or chills +headaches, occipital dominant Gastrointestinal: No reported hemesis, hematochezia, vomiting, or acute GI distress Musculoskeletal: Denies any acute onset joint swelling, redness, loss of ROM, or weakness Neurological: No reported episodes of acute onset apraxia, aphasia, dysarthria, agnosia, amnesia, paralysis, loss of coordination, or loss of consciousness  Medication Review  NONFORMULARY OR COMPOUNDED ITEM, Naphazoline-Pheniramine, Uro-MP, acetaminophen, amLODipine, cholecalciferol, conjugated estrogens, cyclobenzaprine, estradiol, fluconazole, gabapentin, hydrOXYzine, ibuprofen, liothyronine, meloxicam, oxyCODONE, pantoprazole, rizatriptan, timolol, and valACYclovir  History Review  Allergy: Kimberly [name] is allergic to shellfish allergy, peanut-containing drug  products, codeine, diphtheria toxoid, and tetanus toxoids. Drug: Kimberly [name]  reports no history of drug use. Alcohol:  reports current alcohol use. Tobacco:  reports that she has never smoked. She has never used smokeless tobacco. Social: Kimberly [name]  reports that she has never smoked. She has never used smokeless tobacco. She reports current alcohol use. She reports that she does not use drugs. Medical:  has a past medical history of Anxiety, Arthritis, Asthma, Balance problem, Brain cyst, Carpal tunnel syndrome, bilateral, Cervical fusion syndrome, Complete rupture of rotator cuff, Complication of anesthesia, Degenerative disc disease, cervical, Depression, Difficult intubation, Flu (08/10/2018), GERD (gastroesophageal reflux disease), Headache, Herpes, History of kidney stones, History of pneumonia, Hyperlipidemia, Hypertension, Hypothyroidism, Inflammation of shoulder joint, Memory difficulties, Neuromuscular disorder (HCC), Occipital neuralgia, Pneumothorax on right, Pseudoarthrosis of cervical spine (HCC), Recurrent falls, Syncope, and Urinary frequency. Surgical: Kimberly [name]  has a past surgical history that includes Abdominal hysterectomy; Rotator cuff repair (Left, 2014); Foreign Body Removal (Left); Carpal tunnel release (Left, 02/16/2013); Carpal tunnel release (Right, 03/27/2013); Cesarean section;  Anterior cervical decomp/discectomy fusion (N/A, 08/06/2014); Colonoscopy; Nasal sinus surgery (N/A, 04/13/2015); Shoulder arthroscopy with distal clavicle resection (Left, 05/18/2015); Shoulder acromioplasty (Left, 05/18/2015); Cholecystectomy (N/A, 06/17/2015); Neck surgery (2016); Anterior cervical decomp/discectomy fusion (N/A, 07/04/2016); Posterior cervical fusion/foraminotomy (N/A, 11/13/2016); Knee arthroscopy; Extracorporeal shock wave lithotripsy (Left, 09/04/2018); Colonoscopy with propofol (N/A, 05/06/2019); Reduction mammaplasty (10/2019); Esophagogastroduodenoscopy (N/A, 05/08/2021); cysto  with hydrodistension (N/A, 09/26/2021); Cystoscopy w/ retrogrades (Bilateral, 09/26/2021); Cystoscopy with biopsy (N/A, 09/26/2021); Ureteroscopy (Bilateral, 09/26/2021); Esophagogastroduodenoscopy (11/25/2017); Xi robotic assisted oophorectomy (N/A, 12/22/2021); and Excision vaginal cyst (N/A, 12/22/2021). Family: family history includes Cancer in her father; Hypertension in her father and another family member.  Laboratory Chemistry Profile   Renal Lab Results  Component Value Date   BUN 11 08/26/2021   CREATININE 0.80 11/16/2021   BCR 10 07/01/2019   GFRAA 86 07/01/2019   GFRNONAA >60 08/26/2021     Hepatic Lab Results  Component Value Date   AST 20 08/10/2021   ALT 12 08/10/2021   ALBUMIN 3.3 (L) 08/10/2021   ALKPHOS 58 08/10/2021   LIPASE 34 08/10/2021     Electrolytes Lab Results  Component Value Date   NA 136 08/26/2021   K 3.5 08/26/2021   CL 99 08/26/2021   CALCIUM 9.6 08/26/2021     Bone No results found for: "VD25OH", "VD125OH2TOT", "ZO1096EA5", "WU9811BJ4", "25OHVITD1", "25OHVITD2", "25OHVITD3", "TESTOFREE", "TESTOSTERONE"   Inflammation (CRP: Acute Phase) (ESR: Chronic Phase) Lab Results  Component Value Date   ESRSEDRATE 35 (H) 08/12/2021       Note: Above Lab results reviewed.  Recent Imaging Review  US RENAL CLINICAL DATA:  Chronic kidney disease  EXAM: RENAL / URINARY TRACT ULTRASOUND COMPLETE  COMPARISON:  CT abdomen/pelvis dated 04/10/2022  FINDINGS: Right Kidney:  Renal measurements: 9.6 x 5.0 x 4.7 cm = volume: 118 mL. Echogenicity within normal limits. No mass or hydronephrosis visualized.  Left Kidney:  Renal measurements: 10.5 x 5.1 x 4.2 cm = volume: 118 mL. Echogenicity within normal limits. No mass or hydronephrosis visualized.  Bladder:  Appears normal for degree of bladder distention.  Other:  None.  IMPRESSION: Negative renal ultrasound.  No hydronephrosis.  Electronically Signed   By: Charline Bills M.D.    On: 11/05/2022 00:52  Note: Reviewed        Physical Exam  General appearance: Well nourished, well developed, and well hydrated. In no apparent acute distress Mental status: Alert, oriented x 3 (person, place, & time)       Respiratory: No evidence of acute respiratory distress Eyes: PERLA Vitals: BP (!) 122/91   Pulse 97   Temp (!) 97.4 F (36.3 C) (Temporal)   Ht 5\' 1"  (1.549 m)   Wt 146 lb (66.2 kg)   SpO2 99%   BMI 27.59 kg/m  BMI: Estimated body mass index is 27.59 kg/m as calculated from the following:   Height as of this encounter: 5\' 1"  (1.549 m).   Weight as of this encounter: 146 lb (66.2 kg). Ideal: Ideal body weight: 47.8 kg (105 lb 6.1 oz) Adjusted ideal body weight: 55.2 kg (121 lb 10 oz)  Cervical Spine Exam  Skin & Axial Inspection: Well healed scar from previous spine surgery detected Alignment: Symmetrical Functional ROM: Pain restricted ROM      Stability: No instability detected Muscle Tone/Strength: Functionally intact. No obvious neuro-muscular anomalies detected. Sensory (Neurological): Neurogenic pain pattern, occipital neuralgia Palpation: No palpable anomalies  Upper Extremity (UE) Exam      Side: Right upper extremity   Side: Left upper extremity    Skin & Extremity Inspection: Skin color, temperature, and hair growth are WNL. No peripheral edema or cyanosis. No masses, redness, swelling, asymmetry, or associated skin lesions. No contractures.   Skin & Extremity Inspection: Skin color, temperature, and hair growth are WNL. No peripheral edema or cyanosis. No masses, redness, swelling, asymmetry, or associated skin lesions. No contractures.    Functional ROM: Unrestricted ROM           Functional ROM: Unrestricted ROM            Muscle Tone/Strength: Functionally intact. No obvious neuro-muscular anomalies detected.   Muscle Tone/Strength: Functionally intact. No obvious neuro-muscular anomalies detected.    Sensory  (Neurological): Unimpaired           Sensory (Neurological): Unimpaired            Palpation: No palpable anomalies               Palpation: No palpable anomalies                Provocative Test(s):  Phalen's test: deferred Tinel's test: deferred Apley's scratch test (touch opposite shoulder):  Action 1 (Across chest): deferred Action 2 (Overhead): deferred Action 3 (LB reach): deferred     Provocative Test(s):  Phalen's test: deferred Tinel's test: deferred Apley's scratch test (touch opposite shoulder):  Action 1 (Across chest): deferred Action 2 (Overhead): deferred Action 3 (LB reach): deferred       Assessment   Status Diagnosis  Persistent Persistent Persistent 1. Cervical spondylosis without myelopathy   2. Chronic pain syndrome   3. Degenerative disc disease, cervical   4. Cervical myofascial pain syndrome   5. Hx of cervical spine surgery         Plan of Care   Kimberly [name] has a current medication list which includes the following long-term medication(s): gabapentin.  Pharmacotherapy (Medications Ordered): Meds ordered this encounter  Medications   oxyCODONE (OXY IR/ROXICODONE) 5 MG immediate release tablet    Sig: Take 1 tablet (5 mg total) by mouth every 6 (six) hours as needed for severe pain. Must last 30 days.    Dispense:  120 tablet    Refill:  0    Chronic Pain: STOP Act (Not applicable) Fill 1 day early if closed on refill date. Avoid benzodiazepines within 8 hours of opioids   oxyCODONE (OXY IR/ROXICODONE) 5 MG immediate release tablet    Sig: Take 1 tablet (5 mg total) by mouth every 6 (six) hours as needed for severe pain. Must last 30 days.    Dispense:  120 tablet    Refill:  0    Chronic Pain: STOP Act (Not applicable) Fill 1 day early if closed on refill date. Avoid benzodiazepines within 8 hours of opioids   oxyCODONE (OXY IR/ROXICODONE) 5 MG immediate release tablet    Sig: Take 1 tablet (5 mg total) by mouth every 6 (six) hours  as needed for severe pain. Must last 30 days.    Dispense:  120 tablet    Refill:  0    Chronic Pain: STOP Act (Not applicable) Fill 1 day early if closed on refill date. Avoid benzodiazepines within 8 hours of opioids   gabapentin (NEURONTIN) 600 MG tablet    Sig: Take 1 tablet (600 mg total) by mouth 3 (three) times daily.    Dispense:  90  tablet    Refill:  5   UDS up-to-date and appropriate.  Follow-up plan:   No follow-ups on file.     cervical TPI, cervical facet medial branch nerve block, suprascapular nerve block, bilateral C4, C5, C6   cervical facet medial branch nerve block 07/04/2020: Not effective, do not repeat.        Recent Visits No visits were found meeting these conditions. Showing recent visits within past 90 days and meeting all other requirements Today's Visits Date Type Provider Dept  11/29/22 Office Visit Edward Jolly, MD [facility]  Showing today's visits and meeting all other requirements Future Appointments No visits were found meeting these conditions. Showing future appointments within next 90 days and meeting all other requirements  I discussed the assessment and treatment plan with the patient. The patient was provided an opportunity to ask questions and all were answered. The patient agreed with the plan and demonstrated an understanding of the instructions.  Patient advised to call back or seek an in-person evaluation if the symptoms or condition worsens.  Duration of encounter: 30 minutes.  Note by: Edward Jolly, MD Date: 11/29/2022; Time: 11:26 AM

## 2022-12-12 ENCOUNTER — Encounter: Payer: Self-pay | Admitting: Urology

## 2022-12-12 ENCOUNTER — Ambulatory Visit (INDEPENDENT_AMBULATORY_CARE_PROVIDER_SITE_OTHER): Payer: 59 | Admitting: Urology

## 2022-12-12 VITALS — BP 130/74 | HR 80 | Ht 64.0 in | Wt 146.0 lb

## 2022-12-12 DIAGNOSIS — R3129 Other microscopic hematuria: Secondary | ICD-10-CM

## 2022-12-12 LAB — URINALYSIS, COMPLETE
Bilirubin, UA: NEGATIVE
Glucose, UA: NEGATIVE
Leukocytes,UA: NEGATIVE
Nitrite, UA: NEGATIVE
Specific Gravity, UA: 1.02 (ref 1.005–1.030)
Urobilinogen, Ur: 0.2 mg/dL (ref 0.2–1.0)
pH, UA: 5.5 (ref 5.0–7.5)

## 2022-12-12 LAB — MICROSCOPIC EXAMINATION: Epithelial Cells (non renal): >10 /hpf — AB (ref 0–10)

## 2022-12-12 NOTE — Progress Notes (Signed)
   12/12/22  CC:  Chief Complaint  Patient presents with   Cysto    HPI: Refer to Carollee Herter McGowan's previous note of  10/29/22.  RUS showed no abnormalities.  UA today 3-10 RBCs however >10 epis  Refer to rooming tab for vitals NED. A&Ox3.   No respiratory distress   Abd soft, NT, ND Normal external genitalia with patent urethral meatus  Cystoscopy Procedure Note  Patient identification was confirmed, informed consent was obtained, and patient was prepped using Betadine solution.  Lidocaine jelly was administered per urethral meatus.    Procedure: - Flexible cystoscope introduced, without any difficulty.   - Thorough search of the bladder revealed:    normal urethral meatus    normal urothelium    no stones    no ulcers     no tumors    no urethral polyps    no trabeculation  - Ureteral orifices were normal in position and appearance.  Post-Procedure: - Patient tolerated the procedure well  Assessment/ Plan: No bladder mucosal abnormalities identified on cystoscopy She was provided literature on potential dietary triggers for bladder symptoms 21-month PA follow-up    Riki Altes, MD

## 2023-03-05 ENCOUNTER — Ambulatory Visit
Payer: 59 | Attending: Student in an Organized Health Care Education/Training Program | Admitting: Student in an Organized Health Care Education/Training Program

## 2023-03-05 ENCOUNTER — Encounter: Payer: Self-pay | Admitting: Student in an Organized Health Care Education/Training Program

## 2023-03-05 VITALS — BP 111/83 | HR 93 | Temp 98.0°F | Resp 16 | Ht 61.0 in | Wt 145.0 lb

## 2023-03-05 DIAGNOSIS — Q761 Klippel-Feil syndrome: Secondary | ICD-10-CM | POA: Insufficient documentation

## 2023-03-05 DIAGNOSIS — M47812 Spondylosis without myelopathy or radiculopathy, cervical region: Secondary | ICD-10-CM | POA: Diagnosis not present

## 2023-03-05 DIAGNOSIS — G894 Chronic pain syndrome: Secondary | ICD-10-CM | POA: Insufficient documentation

## 2023-03-05 DIAGNOSIS — M5481 Occipital neuralgia: Secondary | ICD-10-CM | POA: Diagnosis not present

## 2023-03-05 DIAGNOSIS — Z9889 Other specified postprocedural states: Secondary | ICD-10-CM | POA: Insufficient documentation

## 2023-03-05 DIAGNOSIS — M7918 Myalgia, other site: Secondary | ICD-10-CM | POA: Insufficient documentation

## 2023-03-05 MED ORDER — OXYCODONE HCL 5 MG PO TABS: 5.0000 mg | ORAL_TABLET | Freq: Four times a day (QID) | ORAL | 0 refills | Status: AC | PRN

## 2023-03-05 MED ORDER — OXYCODONE HCL 5 MG PO TABS: 5.0000 mg | ORAL_TABLET | Freq: Four times a day (QID) | ORAL | 0 refills | Status: DC | PRN

## 2023-03-05 NOTE — Progress Notes (Signed)
Nursing Pain Medication Assessment:  Safety precautions to be maintained throughout the outpatient stay will include: orient to surroundings, keep bed in low position, maintain call bell within reach at all times, provide assistance with transfer out of bed and ambulation.  Medication Inspection Compliance: Pill count conducted under aseptic conditions, in front of the patient. Neither the pills nor the bottle was removed from the patient's sight at any time. Once count was completed pills were immediately returned to the patient in their original bottle.  Medication: Oxycodone IR Pill/Patch Count:  34 of 120 pills remain Pill/Patch Appearance: Markings consistent with prescribed medication Bottle Appearance: Standard pharmacy container. Clearly labeled. Filled Date: 07 / 07 / 2024 Last Medication intake:  Today

## 2023-03-05 NOTE — Progress Notes (Signed)
PROVIDER NOTE: Information contained herein reflects review and annotations entered in association with encounter. Interpretation of such information and data should be left to medically-trained personnel. Information provided to patient can be located elsewhere in the medical record under "Patient Instructions". Document created using STT-dictation technology, any transcriptional errors that may result from process are unintentional.    Patient: Kimberly [name]  Service Category: E/M  Provider: Edward Jolly, MD  DOB: 03/26/1961  DOS: 03/05/2023  Specialty: Interventional Pain Management  MRN: [id number]  Setting: Ambulatory outpatient  PCP: Marisue Ivan, MD  Type: Established Patient    Referring Provider: Marisue Ivan, MD  Location: Office  Delivery: Face-to-face     HPI  [name], a 62 y.o. year old female, is here today because of her Cervical spondylosis without myelopathy [M47.812]. Kimberly [name] primary complain today is Headache and Neck Pain  Last encounter: My last encounter with her was on 11/29/22  Pertinent problems: Kimberly [name] has SHOULDER PAIN; IMPINGEMENT SYNDROME; RUPTURE ROTATOR CUFF; CTS (carpal tunnel syndrome); Cervical spondylosis without myelopathy; Cervical stenosis of spinal canal; Pseudoarthrosis of cervical spine (HCC); Numbness and tingling; Vitamin D deficiency; Cervical fusion syndrome; Hx of cervical spine surgery; Chronic pain syndrome; Cervicalgia; Controlled substance agreement signed; Depression, major, single episode, complete remission (HCC); Cervical myofascial pain syndrome; and Occipital headache on their pertinent problem list. Pain Assessment: Severity of Chronic pain is reported as a 7 /10. Location: Head Posterior/radiates down to neck. Onset: More than a month ago. Quality: Throbbing. Timing: Constant. Modifying factor(s): nothing. Vitals:  height is 5\' 1"  (1.549 m) and weight is 145 lb (65.8 kg). Her temperature is 98 F (36.7 C). Her  blood pressure is 111/83 and her pulse is 93. Her respiration is 16 and oxygen saturation is 97%.   Reason for encounter: medication management.    Chronic occipital pain, occipital neuralgia. She had an occipital nerve block with neurology which was not helpful Unfortunately, her husband passed away at the end of last month.  He had end-stage renal disease and was on dialysis.  She is very sad and obviously grieving from his loss.  She states that she has a good family support system in place  Pharmacotherapy Assessment  Analgesic: Oxycodone 5mg  every 6hours as needed, quantity 120/month; MME equals 30    Monitoring: [place] PMP: PDMP reviewed during this encounter.       Pharmacotherapy: No side-effects or adverse reactions reported. Compliance: No problems identified. Effectiveness: Clinically acceptable.  UDS:  Summary  Date Value Ref Range Status  02/13/2022 Note  Final    Comment:    ==================================================================== [employer]ox[employer] Select 13 (MW) ==================================================================== Test                             Result       Flag       Units  Drug Present and Declared for Prescription Verification   Oxycodone                      3605         EXPECTED   ng/mg creat   Oxymorphone                    3670         EXPECTED   ng/mg creat   Noroxycodone  2769         EXPECTED   ng/mg creat   Noroxymorphone                 1088         EXPECTED   ng/mg creat    Sources of oxycodone are scheduled prescription medications.    Oxymorphone, noroxycodone, and noroxymorphone are expected    metabolites of oxycodone. Oxymorphone is also available as a    scheduled prescription medication.  Drug Absent but Declared for Prescription Verification   Diazepam                       Not Detected UNEXPECTED ng/mg creat ==================================================================== Test                      Result     Flag   Units      Ref Range   Creatinine              119              mg/dL      >=82 ==================================================================== Declared Medications:  The flagging and interpretation on this report are based on the  following declared medications.  Unexpected results may arise from  inaccuracies in the declared medications.   **Note: The testing scope of this panel includes these medications:   Diazepam  Oxycodone   **Note: The testing scope of this panel does not include the  following reported medications:   Acetaminophen (Tylenol)  Amitriptyline (Elavil)  Amlodipine (Norvasc)  Estradiol (Estrace)  Eye Drop  Gabapentin (Neurontin)  Hydroxyzine (Atarax)  Hyoscyamine (Uribel)  Meloxicam (Mobic)  Methenamine (Uribel)  Methylene Blue (Uribel)  Pantoprazole (Protonix)  Phenyl salicylate (Uribel)  Rizatriptan (Maxalt)  Sodium phosphate, monobasic (Uribel)  Valacyclovir (Valtrex)  Vitamin D3 ==================================================================== For clinical consultation, please call [phone]. ====================================================================       ROS  Constitutional: Denies any fever or chills +headaches, occipital dominant Gastrointestinal: No reported hemesis, hematochezia, vomiting, or acute GI distress Musculoskeletal: Denies any acute onset joint swelling, redness, loss of ROM, or weakness Neurological: No reported episodes of acute onset apraxia, aphasia, dysarthria, agnosia, amnesia, paralysis, loss of coordination, or loss of consciousness  Medication Review  NONFORMULARY OR COMPOUNDED ITEM, Naphazoline-Pheniramine, Uro-MP, acetaminophen, amLODipine, cholecalciferol, conjugated estrogens, cyclobenzaprine, estradiol, fluconazole, gabapentin, hydrOXYzine, ibuprofen, liothyronine, meloxicam, oxyCODONE, pantoprazole, rizatriptan, timolol, and valACYclovir  History Review  Allergy: Kimberly [name] is  allergic to shellfish allergy, peanut-containing drug products, codeine, diphtheria toxoid, and tetanus toxoids. Drug: Kimberly [name]  reports no history of drug use. Alcohol:  reports current alcohol use. Tobacco:  reports that she has never smoked. She has never used smokeless tobacco. Social: Kimberly [name]  reports that she has never smoked. She has never used smokeless tobacco. She reports current alcohol use. She reports that she does not use drugs. Medical:  has a past medical history of Anxiety, Arthritis, Asthma, Balance problem, Brain cyst, Carpal tunnel syndrome, bilateral, Cervical fusion syndrome, Complete rupture of rotator cuff, Complication of anesthesia, Degenerative disc disease, cervical, Depression, Difficult intubation, Flu (08/10/2018), GERD (gastroesophageal reflux disease), Headache, Herpes, History of kidney stones, History of pneumonia, Hyperlipidemia, Hypertension, Hypothyroidism, Inflammation of shoulder joint, Memory difficulties, Neuromuscular disorder (HCC), Occipital neuralgia, Pneumothorax on right, Pseudoarthrosis of cervical spine (HCC), Recurrent falls, Syncope, and Urinary frequency. Surgical: Kimberly [name]  has a past surgical history that includes Abdominal hysterectomy; Rotator cuff repair (Left, 2014); Foreign Body Removal (Left);  Carpal tunnel release (Left, 02/16/2013); Carpal tunnel release (Right, 03/27/2013); Cesarean section; Anterior cervical decomp/discectomy fusion (N/A, 08/06/2014); Colonoscopy; Nasal sinus surgery (N/A, 04/13/2015); Shoulder arthroscopy with distal clavicle resection (Left, 05/18/2015); Shoulder acromioplasty (Left, 05/18/2015); Cholecystectomy (N/A, 06/17/2015); Neck surgery (2016); Anterior cervical decomp/discectomy fusion (N/A, 07/04/2016); Posterior cervical fusion/foraminotomy (N/A, 11/13/2016); Knee arthroscopy; Extracorporeal shock wave lithotripsy (Left, 09/04/2018); Colonoscopy with propofol (N/A, 05/06/2019); Reduction mammaplasty  (10/2019); Esophagogastroduodenoscopy (N/A, 05/08/2021); cysto with hydrodistension (N/A, 09/26/2021); Cystoscopy w/ retrogrades (Bilateral, 09/26/2021); Cystoscopy with biopsy (N/A, 09/26/2021); Ureteroscopy (Bilateral, 09/26/2021); Esophagogastroduodenoscopy (11/25/2017); Xi robotic assisted oophorectomy (N/A, 12/22/2021); and Excision vaginal cyst (N/A, 12/22/2021). Family: family history includes Cancer in her father; Hypertension in her father and another family member.  Laboratory Chemistry Profile   Renal Lab Results  Component Value Date   BUN 11 08/26/2021   CREATININE 0.80 11/16/2021   BCR 10 07/01/2019   GFRAA 86 07/01/2019   GFRNONAA >60 08/26/2021     Hepatic Lab Results  Component Value Date   AST 20 08/10/2021   ALT 12 08/10/2021   ALBUMIN 3.3 (L) 08/10/2021   ALKPHOS 58 08/10/2021   LIPASE 34 08/10/2021     Electrolytes Lab Results  Component Value Date   NA 136 08/26/2021   K 3.5 08/26/2021   CL 99 08/26/2021   CALCIUM 9.6 08/26/2021     Bone No results found for: "VD25OH", "VD125OH2TOT", "NW2956OZ3", "YQ6578IO9", "25OHVITD1", "25OHVITD2", "25OHVITD3", "TESTOFREE", "TESTOSTERONE"   Inflammation (CRP: Acute Phase) (ESR: Chronic Phase) Lab Results  Component Value Date   ESRSEDRATE 35 (H) 08/12/2021       Note: Above Lab results reviewed.  Recent Imaging Review  US RENAL CLINICAL DATA:  Chronic kidney disease  EXAM: RENAL / URINARY TRACT ULTRASOUND COMPLETE  COMPARISON:  CT abdomen/pelvis dated 04/10/2022  FINDINGS: Right Kidney:  Renal measurements: 9.6 x 5.0 x 4.7 cm = volume: 118 mL. Echogenicity within normal limits. No mass or hydronephrosis visualized.  Left Kidney:  Renal measurements: 10.5 x 5.1 x 4.2 cm = volume: 118 mL. Echogenicity within normal limits. No mass or hydronephrosis visualized.  Bladder:  Appears normal for degree of bladder distention.  Other:  None.  IMPRESSION: Negative renal ultrasound.  No  hydronephrosis.  Electronically Signed   By: Charline Bills M.D.   On: 11/05/2022 00:52  Note: Reviewed        Physical Exam  General appearance: Well nourished, well developed, and well hydrated. In no apparent acute distress Mental status: Alert, oriented x 3 (person, place, & time)       Respiratory: No evidence of acute respiratory distress Eyes: PERLA Vitals: BP 111/83   Pulse 93   Temp 98 F (36.7 C)   Resp 16   Ht 5\' 1"  (1.549 m)   Wt 145 lb (65.8 kg)   SpO2 97%   BMI 27.40 kg/m  BMI: Estimated body mass index is 27.4 kg/m as calculated from the following:   Height as of this encounter: 5\' 1"  (1.549 m).   Weight as of this encounter: 145 lb (65.8 kg). Ideal: Ideal body weight: 47.8 kg (105 lb 6.1 oz) Adjusted ideal body weight: 55 kg (121 lb 3.7 oz)  Cervical Spine Exam  Skin & Axial Inspection: Well healed scar from previous spine surgery detected Alignment: Symmetrical Functional ROM: Pain restricted ROM      Stability: No instability detected Muscle Tone/Strength: Functionally intact. No obvious neuro-muscular anomalies detected. Sensory (Neurological): Neurogenic pain pattern, occipital neuralgia Palpation: No palpable anomalies  Upper Extremity (UE) Exam      Side: Right upper extremity   Side: Left upper extremity    Skin & Extremity Inspection: Skin color, temperature, and hair growth are WNL. No peripheral edema or cyanosis. No masses, redness, swelling, asymmetry, or associated skin lesions. No contractures.   Skin & Extremity Inspection: Skin color, temperature, and hair growth are WNL. No peripheral edema or cyanosis. No masses, redness, swelling, asymmetry, or associated skin lesions. No contractures.    Functional ROM: Unrestricted ROM           Functional ROM: Unrestricted ROM            Muscle Tone/Strength: Functionally intact. No obvious neuro-muscular anomalies detected.   Muscle Tone/Strength: Functionally intact. No  obvious neuro-muscular anomalies detected.    Sensory (Neurological): Unimpaired           Sensory (Neurological): Unimpaired            Palpation: No palpable anomalies               Palpation: No palpable anomalies                Provocative Test(s):  Phalen's test: deferred Tinel's test: deferred Apley's scratch test (touch opposite shoulder):  Action 1 (Across chest): deferred Action 2 (Overhead): deferred Action 3 (LB reach): deferred     Provocative Test(s):  Phalen's test: deferred Tinel's test: deferred Apley's scratch test (touch opposite shoulder):  Action 1 (Across chest): deferred Action 2 (Overhead): deferred Action 3 (LB reach): deferred       Assessment   Status Diagnosis  Persistent Persistent Persistent 1. Cervical spondylosis without myelopathy   2. Cervical myofascial pain syndrome   3. Bilateral occipital neuralgia   4. Hx of cervical spine surgery   5. Cervical fusion syndrome (C2-T1)   6. Chronic pain syndrome         Plan of Care   Kimberly [name] has a current medication list which includes the following long-term medication(s): gabapentin.  Pharmacotherapy (Medications Ordered): Meds ordered this encounter  Medications   oxyCODONE (OXY IR/ROXICODONE) 5 MG immediate release tablet    Sig: Take 1 tablet (5 mg total) by mouth every 6 (six) hours as needed for severe pain. Must last 30 days.    Dispense:  120 tablet    Refill:  0    Chronic Pain: STOP Act (Not applicable) Fill 1 day early if closed on refill date. Avoid benzodiazepines within 8 hours of opioids   oxyCODONE (OXY IR/ROXICODONE) 5 MG immediate release tablet    Sig: Take 1 tablet (5 mg total) by mouth every 6 (six) hours as needed for severe pain. Must last 30 days.    Dispense:  120 tablet    Refill:  0    Chronic Pain: STOP Act (Not applicable) Fill 1 day early if closed on refill date. Avoid benzodiazepines within 8 hours of opioids   oxyCODONE (OXY IR/ROXICODONE) 5 MG  immediate release tablet    Sig: Take 1 tablet (5 mg total) by mouth every 6 (six) hours as needed for severe pain. Must last 30 days.    Dispense:  120 tablet    Refill:  0    Chronic Pain: STOP Act (Not applicable) Fill 1 day early if closed on refill date. Avoid benzodiazepines within 8 hours of opioids   Orders Placed This Encounter  Procedures   ToxASSURE Select 13 (MW), Urine    Volume: 30 ml(s). Minimum 3  ml of urine is needed. Document temperature of fresh sample. Indications: Long term (current) use of opiate analgesic [phone])    Order Specific Question:   Release to patient    Answer:   Immediate     Follow-up plan:   Return in about 3 months (around 06/05/2023) for Medication Management, in person.     cervical TPI, cervical facet medial branch nerve block, suprascapular nerve block, bilateral C4, C5, C6   cervical facet medial branch nerve block 07/04/2020: Not effective, do not repeat.        Recent Visits No visits were found meeting these conditions. Showing recent visits within past 90 days and meeting all other requirements Today's Visits Date Type Provider Dept  03/05/23 Office Visit Edward Jolly, MD [facility]  Showing today's visits and meeting all other requirements Future Appointments Date Type Provider Dept  05/30/23 Appointment Edward Jolly, MD [facility]  Showing future appointments within next 90 days and meeting all other requirements  I discussed the assessment and treatment plan with the patient. The patient was provided an opportunity to ask questions and all were answered. The patient agreed with the plan and demonstrated an understanding of the instructions.  Patient advised to call back or seek an in-person evaluation if the symptoms or condition worsens.  Duration of encounter: 30 minutes.  Note by: Edward Jolly, MD Date: 03/05/2023; Time: 11:58 AM

## 2023-03-16 IMAGING — MG DIGITAL DIAGNOSTIC BILAT W/ TOMO W/ CAD
8 series · 8 of 24 positions shown · non-contrast
Comparison: Previous exam(s).

CLINICAL DATA: 59-year-old female presenting for evaluation of
bilateral diffuse lateral breast pain. The patient has been on
estradiol long term. She had a reduction mammoplasty just after her
mammogram in Saturday September, 2019. She has also had 3 surgeries on her
neck.

EXAM:
DIGITAL DIAGNOSTIC BILATERAL MAMMOGRAM WITH TOMOSYNTHESIS AND CAD
TECHNIQUE: Bilateral digital diagnostic mammography and breast tomosynthesis
was performed. The images were evaluated with computer-aided
detection.

[R MLO synth-2D]
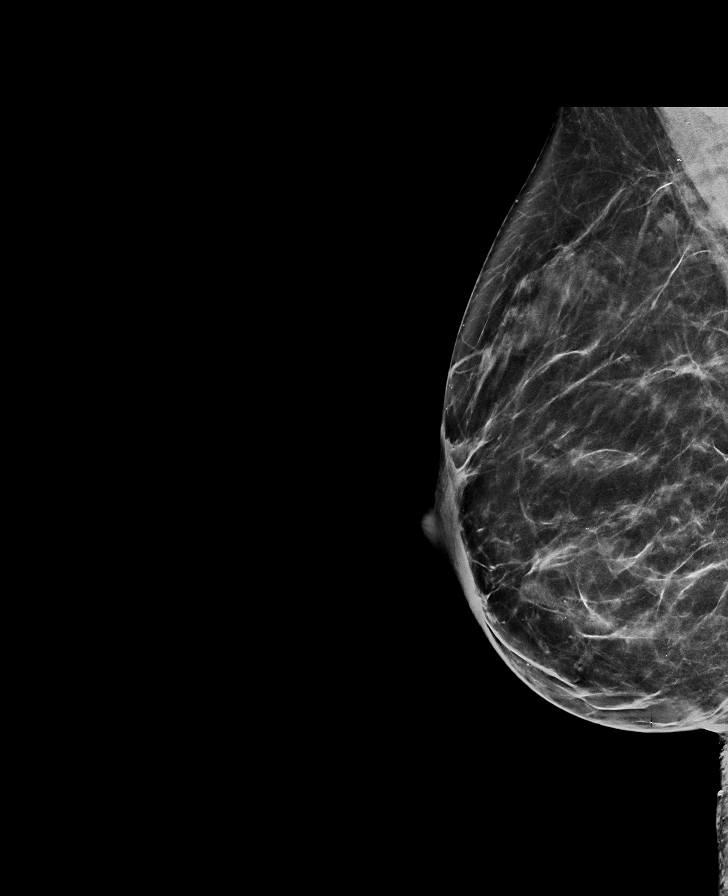

[R CC synth-2D]
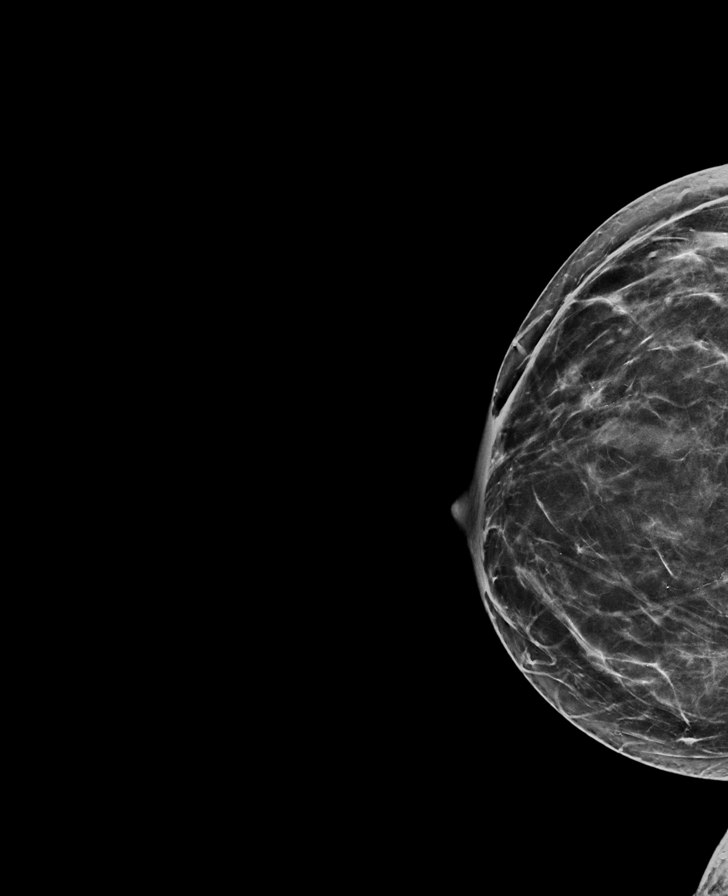

[L MLO synth-2D]
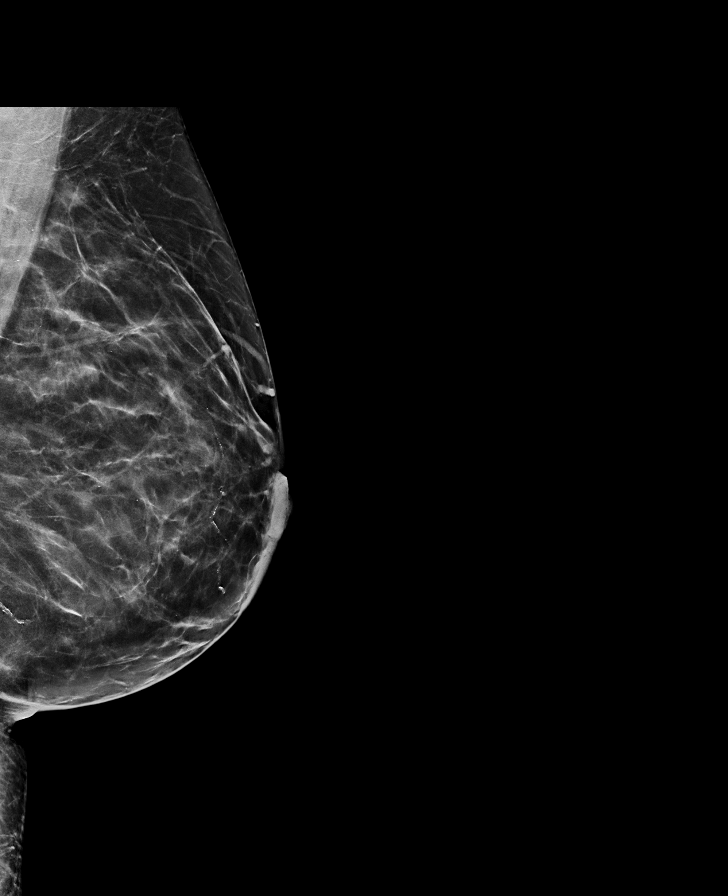

[L CC synth-2D]
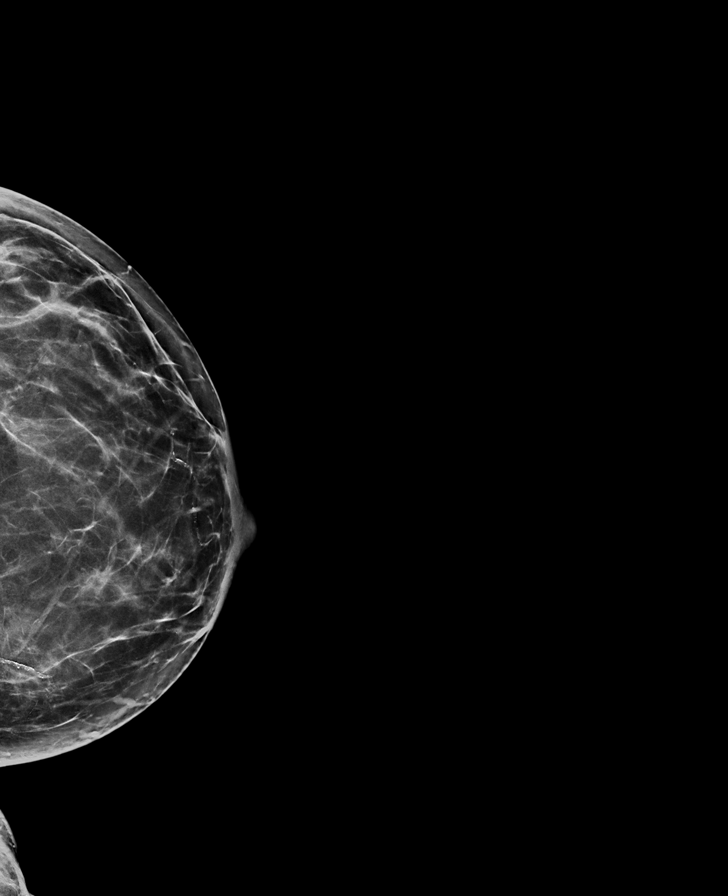

[L CC tomo · tomo slice 37/74.0]
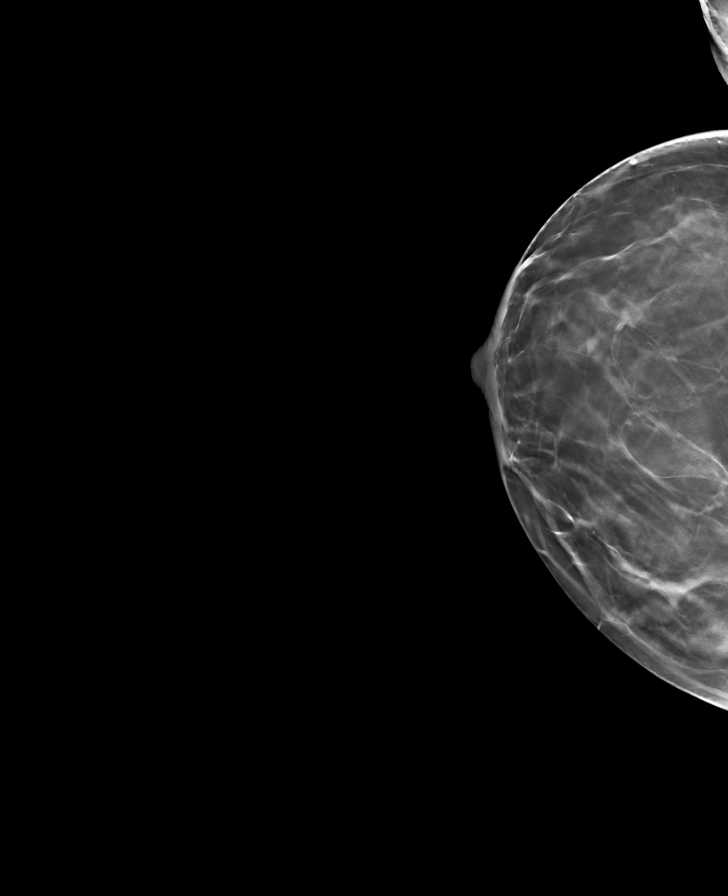

[R MLO tomo · tomo slice 38/75.0]
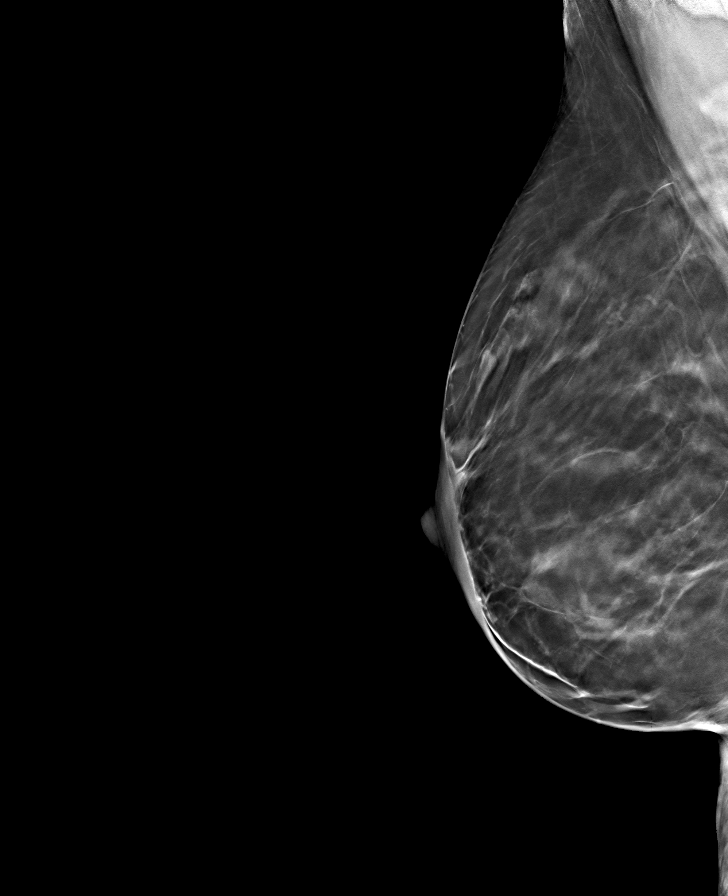

[R CC tomo · tomo slice 35/70.0]
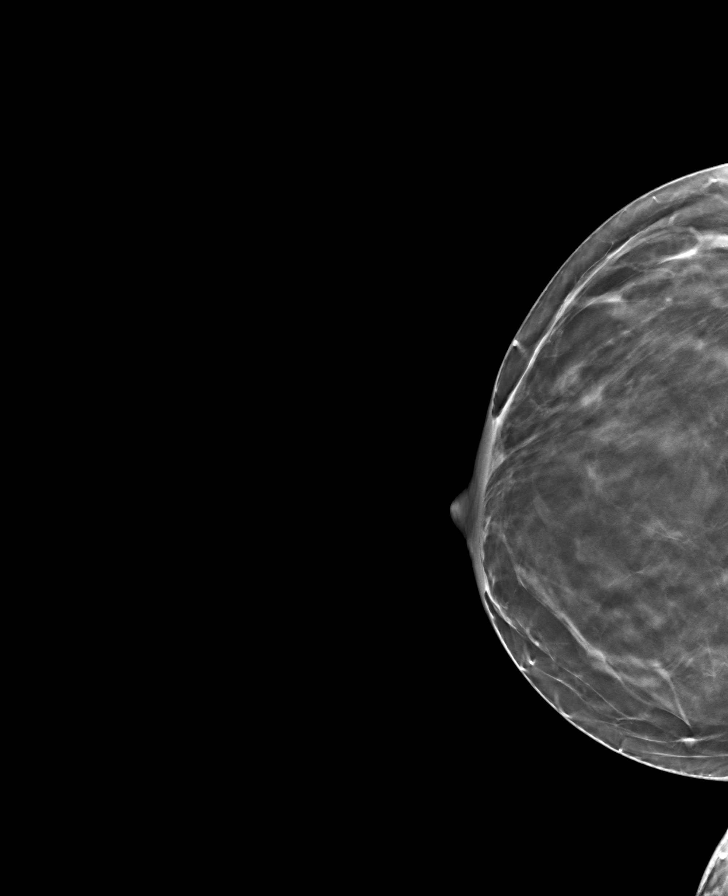

[L MLO tomo · tomo slice 39/78.0]
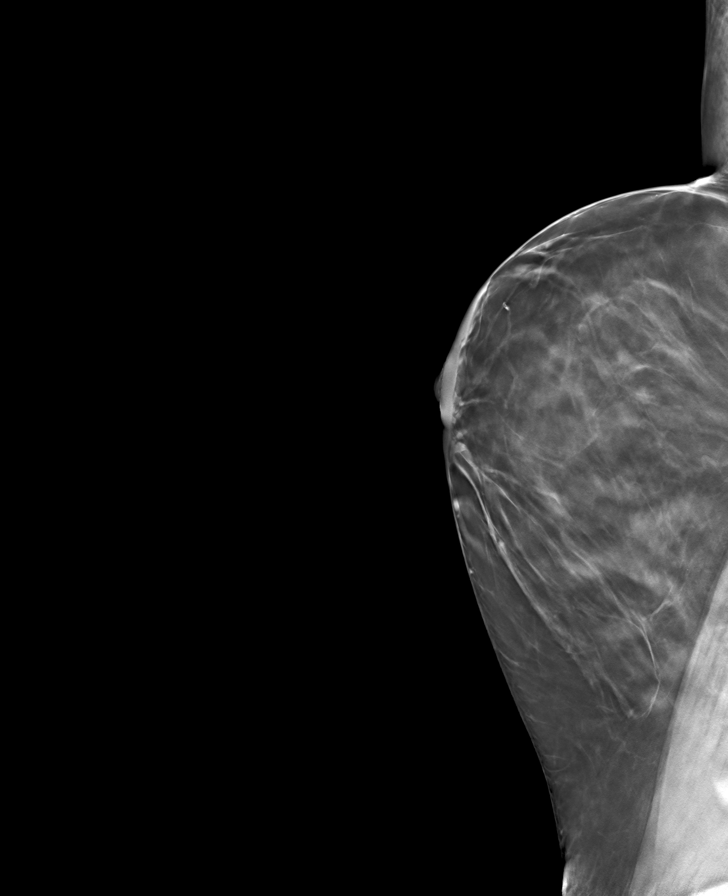

[8 of 24 positions shown; findings below may reference images not displayed]

ACR Breast Density Category b: There are scattered areas of
fibroglandular density.
FINDINGS: No suspicious calcifications, masses or areas of distortion are seen
in the bilateral breasts. Expected surgical changes consistent with
history of bilateral reduction mammoplasty is noted.
IMPRESSION: 1. No suspicious findings in either breast to correspond with the
patient's concern of bilateral breast pain.

2.  No mammographic evidence of malignancy in the bilateral breasts.

RECOMMENDATION:
1. Breast pain is a common condition, which will often resolve on
its own without intervention. It can be affected by hormonal
changes, medication side effect, weight changes and fit of the bra.
Pain may also be referred from other adjacent areas of the body.
Breast pain may be improved by wearing adequate well-fitting
support, over-the-counter topical and oral NSAID medication, low-fat
diet, and ice/heat as needed. Studies have shown an improvement in
cyclic pain with use of evening primrose oil and vitamin E. Clinical
follow-up recommended to discuss any further work-up recommendations
and appropriate treatment.

2.  Screening mammogram in one year.(Code:[id number])

I have discussed the findings and recommendations with the patient.
If applicable, a reminder letter will be sent to the patient
regarding the next appointment.

BI-RADS CATEGORY  2: Benign.

## 2023-03-27 ENCOUNTER — Telehealth: Payer: Self-pay | Admitting: Urology

## 2023-03-27 NOTE — Telephone Encounter (Signed)
Patient dropped in office to give new insurance information that will become effective 03/31/23. It is still [employer], but new policy number. She said that insurance company told her to let our office know before next refill. New card was scanned in, but can't be updated in system until effective on 03/31/23. She said this is for Premarin prescription that goes to [facility] on [address]. She said she will need refill sent after 03/31/23.

## 2023-03-28 NOTE — Telephone Encounter (Signed)
Left detailed message stating she can call in for a refill after her insurance is valid, pt has refills on premarin.

## 2023-04-05 ENCOUNTER — Telehealth: Payer: Self-pay | Admitting: Student in an Organized Health Care Education/Training Program

## 2023-04-05 NOTE — Telephone Encounter (Signed)
PT called stated that she spoke with pharmacy and was told that Dr. Cherylann Ratel needs to send over prescription for oxycodone so they can refill her prescription. Please give patient a call. TY

## 2023-04-05 NOTE — Telephone Encounter (Signed)
Spoke with pharmacy and they states prescription is ready to be picked up.  Patient notified.

## 2023-04-24 ENCOUNTER — Other Ambulatory Visit: Payer: Self-pay | Admitting: Neurosurgery

## 2023-04-24 DIAGNOSIS — Q761 Klippel-Feil syndrome: Secondary | ICD-10-CM

## 2023-04-29 ENCOUNTER — Ambulatory Visit
Admission: RE | Admit: 2023-04-29 | Discharge: 2023-04-29 | Disposition: A | Discharge status: Home / Self Care | Payer: Medicare Other | Source: Ambulatory Visit | Attending: Neurosurgery | Admitting: Neurosurgery

## 2023-04-29 DIAGNOSIS — Q761 Klippel-Feil syndrome: Secondary | ICD-10-CM | POA: Insufficient documentation

## 2023-04-29 MED ORDER — GADOBUTROL 1 MMOL/ML IV SOLN
6.0000 mL | Freq: Once | INTRAVENOUS | Status: AC | PRN
  Administered 2023-04-29: 6 mL via INTRAVENOUS

## 2023-05-07 ENCOUNTER — Telehealth: Payer: Self-pay

## 2023-05-07 ENCOUNTER — Other Ambulatory Visit: Payer: Self-pay | Admitting: Urology

## 2023-05-07 DIAGNOSIS — R300 Dysuria: Secondary | ICD-10-CM

## 2023-05-07 NOTE — Telephone Encounter (Signed)
Pt calls triage line and states that she is having vaginal pain. She states that she was given estrogen cream by our office to help with this problem but has not seen improvement. She states she was on Uribel previously and had relief with that. She questions if she can resume that medication. Please advise.

## 2023-05-08 ENCOUNTER — Other Ambulatory Visit: Payer: Self-pay | Admitting: Urology

## 2023-05-08 MED ORDER — PHENAZOPYRIDINE HCL 200 MG PO TABS: 200.0000 mg | ORAL_TABLET | Freq: Three times a day (TID) | ORAL | 0 refills | Status: AC | PRN

## 2023-05-08 NOTE — Telephone Encounter (Signed)
Patient has not called her gynecologist and she will tomorrow and try to get an appointment. Carollee Herter sent in Pyridium to [facility] pharmacy and patient advised

## 2023-05-08 NOTE — Telephone Encounter (Signed)
Patient called back to follow up on her message. Patient states she is taking oxycodone but not flexeril.

## 2023-05-15 ENCOUNTER — Other Ambulatory Visit: Payer: Self-pay | Admitting: Urology

## 2023-05-21 ENCOUNTER — Ambulatory Visit
Payer: Medicare Other | Attending: Student in an Organized Health Care Education/Training Program | Admitting: Student in an Organized Health Care Education/Training Program

## 2023-05-21 ENCOUNTER — Encounter: Payer: Self-pay | Admitting: Student in an Organized Health Care Education/Training Program

## 2023-05-21 VITALS — BP 127/87 | HR 87 | Temp 98.1°F | Ht 61.0 in | Wt 152.0 lb

## 2023-05-21 DIAGNOSIS — Z9889 Other specified postprocedural states: Secondary | ICD-10-CM | POA: Diagnosis present

## 2023-05-21 DIAGNOSIS — T402X5A Adverse effect of other opioids, initial encounter: Secondary | ICD-10-CM | POA: Diagnosis present

## 2023-05-21 DIAGNOSIS — M7918 Myalgia, other site: Secondary | ICD-10-CM

## 2023-05-21 DIAGNOSIS — M47812 Spondylosis without myelopathy or radiculopathy, cervical region: Secondary | ICD-10-CM | POA: Diagnosis present

## 2023-05-21 DIAGNOSIS — K5903 Drug induced constipation: Secondary | ICD-10-CM | POA: Insufficient documentation

## 2023-05-21 DIAGNOSIS — R140 Abdominal distension (gaseous): Secondary | ICD-10-CM

## 2023-05-21 DIAGNOSIS — Q761 Klippel-Feil syndrome: Secondary | ICD-10-CM

## 2023-05-21 DIAGNOSIS — M5481 Occipital neuralgia: Secondary | ICD-10-CM | POA: Diagnosis present

## 2023-05-21 DIAGNOSIS — R1084 Generalized abdominal pain: Secondary | ICD-10-CM

## 2023-05-21 MED ORDER — NALOXEGOL OXALATE 12.5 MG PO TABS
12.5000 mg | ORAL_TABLET | Freq: Every day | ORAL | 2 refills | Status: AC
Start: 2023-05-21 — End: 2023-08-19

## 2023-05-21 MED ORDER — OXYCODONE HCL 5 MG PO TABS
5.0000 mg | ORAL_TABLET | Freq: Four times a day (QID) | ORAL | 0 refills | Status: DC | PRN
Start: 2023-08-04 — End: 2023-08-29

## 2023-05-21 MED ORDER — OXYCODONE HCL 5 MG PO TABS
5.0000 mg | ORAL_TABLET | Freq: Four times a day (QID) | ORAL | 0 refills | Status: AC | PRN
Start: 2023-06-05 — End: 2023-07-05

## 2023-05-21 MED ORDER — OXYCODONE HCL 5 MG PO TABS
5.0000 mg | ORAL_TABLET | Freq: Four times a day (QID) | ORAL | 0 refills | Status: AC | PRN
Start: 2023-07-05 — End: 2023-08-04

## 2023-05-21 NOTE — Progress Notes (Signed)
PROVIDER NOTE: Information contained herein reflects review and annotations entered in association with encounter. Interpretation of such information and data should be left to medically-trained personnel. Information provided to patient can be located elsewhere in the medical record under "Patient Instructions". Document created using STT-dictation technology, any transcriptional errors that may result from process are unintentional.    Patient: Kimberly [name]  Service Category: E/M  Provider: Edward Jolly, MD  DOB: 07-31-1960  DOS: 05/21/2023  Specialty: Interventional Pain Management  MRN: [id number]  Setting: Ambulatory outpatient  PCP: Marisue Ivan, MD  Type: Established Patient    Referring Provider: Marisue Ivan, MD  Location: Office  Delivery: Face-to-face     HPI  [name], a 62 y.o. year old female, is here today because of her Cervical spondylosis without myelopathy [M47.812]. Kimberly [name] primary complain today is Headache  Last encounter: My last encounter with her was on 11/29/22  Pertinent problems: Kimberly [name] has SHOULDER PAIN; IMPINGEMENT SYNDROME; RUPTURE ROTATOR CUFF; CTS (carpal tunnel syndrome); Cervical spondylosis without myelopathy; Cervical stenosis of spinal canal; Pseudoarthrosis of cervical spine (HCC); Numbness and tingling; Vitamin D deficiency; Cervical fusion syndrome; Hx of cervical spine surgery; Chronic pain syndrome; Cervicalgia; Controlled substance agreement signed; Depression, major, single episode, complete remission (HCC); Cervical myofascial pain syndrome; and Occipital headache on their pertinent problem list. Pain Assessment: Severity of Chronic pain is reported as a 7 /10. Location: Head (head) Left, Right/pain radiaties down the left side of her head. Onset: More than a month ago. Quality: Sharp, Stabbing, Aching, Burning, Throbbing. Timing: Constant. Modifying factor(s): Meds. Vitals:  height is 5\' 1"  (1.549 m) and weight is 152 lb  (68.9 kg). Her temperature is 98.1 F (36.7 C). Her blood pressure is 127/87 and her pulse is 87. Her oxygen saturation is 99%.   Reason for encounter: medication management.    Chronic occipital pain, occipital neuralgia. She states that she is noticing abdominal distention and abdominal pain.  She followed up with her primary care provider who suggested that it was due to her chronic opioid therapy.  She did reduce her as needed intake which resulted in increased occipital neuralgia.  She was also started on Colace.  She states that this has not helped.  She is requesting a referral to GI.  She also endorses abdominal bloating and weight gain.  She is concerned about this.  I recommend Movantik for OIC  Pharmacotherapy Assessment  Analgesic: Oxycodone 5mg  every 6hours as needed, quantity 120/month; MME equals 30    Monitoring: [place] PMP: PDMP reviewed during this encounter.       Pharmacotherapy: Opioid-induced constipation (OIC)(K59.03, T40.2X5A) Compliance: No problems identified. Effectiveness: Clinically acceptable.  UDS:  Summary  Date Value Ref Range Status  03/05/2023 Note  Final    Comment:    ==================================================================== [employer] Select 13 (MW) ==================================================================== Specimen Alert Not Detected result may be consistent with the time of last use noted for this medication. AS NEEDED (Diazepam) ==================================================================== Test                             Result       Flag       Units  Drug Present and Declared for Prescription Verification   Oxycodone                      1440         EXPECTED   ng/mg creat  Oxymorphone                    2005         EXPECTED   ng/mg creat   Noroxycodone                   >3135        EXPECTED   ng/mg creat   Noroxymorphone                 780          EXPECTED   ng/mg creat    Sources of oxycodone are scheduled  prescription medications.    Oxymorphone, noroxycodone, and noroxymorphone are expected    metabolites of oxycodone. Oxymorphone is also available as a    scheduled prescription medication.  Drug Absent but Declared for Prescription Verification   Diazepam                       Not Detected UNEXPECTED ng/mg creat ==================================================================== Test                      Result    Flag   Units      Ref Range   Creatinine              319              mg/dL      >=95 ==================================================================== Declared Medications:  The flagging and interpretation on this report are based on the  following declared medications.  Unexpected results may arise from  inaccuracies in the declared medications.   **Note: The testing scope of this panel includes these medications:   Diazepam  Oxycodone (Roxicodone)   **Note: The testing scope of this panel does not include the  following reported medications:   Acetaminophen (Tylenol)  Amlodipine (Norvasc)  Cyclobenzaprine (Flexeril)  Estradiol (Estrace)  Estrogens (Premarin)  Eye Drop  Fluconazole (Diflucan)  Gabapentin (Neurontin)  Hydroxyzine (Atarax)  Hyoscyamine (Uro-MP)  Ibuprofen (Advil)  Liothyronine (Cytomel)  Meloxicam (Mobic)  Methenamine (Uro-MP)  Methylene blue (Uro-MP)  Pantoprazole (Protonix)  Phenyl salicylate (Uro-MP)  Rizatriptan (Maxalt)  Sodium phosphate, monobasic (Uro-MP)  Timolol (Timoptic)  Valacyclovir (Valtrex)  Vitamin D3 ==================================================================== For clinical consultation, please call [phone]. ====================================================================       ROS  Constitutional: Denies any fever or chills +headaches, occipital dominant Gastrointestinal: No reported hemesis, hematochezia, vomiting, or acute GI distress Musculoskeletal: Denies any acute onset joint swelling,  redness, loss of ROM, or weakness Neurological: No reported episodes of acute onset apraxia, aphasia, dysarthria, agnosia, amnesia, paralysis, loss of coordination, or loss of consciousness  Medication Review  NONFORMULARY OR COMPOUNDED ITEM, Naphazoline-Pheniramine, Uro-MP, acetaminophen, amLODipine, cholecalciferol, conjugated estrogens, cyclobenzaprine, estradiol, fluconazole, gabapentin, hydrOXYzine, ibuprofen, liothyronine, meloxicam, naloxegol oxalate, oxyCODONE, pantoprazole, phenazopyridine, rizatriptan, timolol, and valACYclovir  History Review  Allergy: Kimberly [name] is allergic to shellfish allergy, peanut-containing drug products, codeine, diphtheria toxoid, and tetanus toxoids. Drug: Kimberly [name]  reports no history of drug use. Alcohol:  reports current alcohol use. Tobacco:  reports that she has never smoked. She has never used smokeless tobacco. Social: Kimberly [name]  reports that she has never smoked. She has never used smokeless tobacco. She reports current alcohol use. She reports that she does not use drugs. Medical:  has a past medical history of Anxiety, Arthritis, Asthma, Balance problem, Brain cyst, Carpal tunnel syndrome, bilateral, Cervical fusion syndrome, Complete rupture of rotator cuff, Complication  of anesthesia, Degenerative disc disease, cervical, Depression, Difficult intubation, Flu (08/10/2018), GERD (gastroesophageal reflux disease), Headache, Herpes, History of kidney stones, History of pneumonia, Hyperlipidemia, Hypertension, Hypothyroidism, Inflammation of shoulder joint, Memory difficulties, Neuromuscular disorder (HCC), Occipital neuralgia, Pneumothorax on right, Pseudoarthrosis of cervical spine (HCC), Recurrent falls, Syncope, and Urinary frequency. Surgical: Kimberly [name]  has a past surgical history that includes Abdominal hysterectomy; Rotator cuff repair (Left, 2014); Foreign Body Removal (Left); Carpal tunnel release (Left, 02/16/2013); Carpal tunnel release  (Right, 03/27/2013); Cesarean section; Anterior cervical decomp/discectomy fusion (N/A, 08/06/2014); Colonoscopy; Nasal sinus surgery (N/A, 04/13/2015); Shoulder arthroscopy with distal clavicle resection (Left, 05/18/2015); Shoulder acromioplasty (Left, 05/18/2015); Cholecystectomy (N/A, 06/17/2015); Neck surgery (2016); Anterior cervical decomp/discectomy fusion (N/A, 07/04/2016); Posterior cervical fusion/foraminotomy (N/A, 11/13/2016); Knee arthroscopy; Extracorporeal shock wave lithotripsy (Left, 09/04/2018); Colonoscopy with propofol (N/A, 05/06/2019); Reduction mammaplasty (10/2019); Esophagogastroduodenoscopy (N/A, 05/08/2021); cysto with hydrodistension (N/A, 09/26/2021); Cystoscopy w/ retrogrades (Bilateral, 09/26/2021); Cystoscopy with biopsy (N/A, 09/26/2021); Ureteroscopy (Bilateral, 09/26/2021); Esophagogastroduodenoscopy (11/25/2017); Xi robotic assisted oophorectomy (N/A, 12/22/2021); and Excision vaginal cyst (N/A, 12/22/2021). Family: family history includes Cancer in her father; Hypertension in her father and another family member.  Laboratory Chemistry Profile   Renal Lab Results  Component Value Date   BUN 11 08/26/2021   CREATININE 0.80 11/16/2021   BCR 10 07/01/2019   GFRAA 86 07/01/2019   GFRNONAA >60 08/26/2021     Hepatic Lab Results  Component Value Date   AST 20 08/10/2021   ALT 12 08/10/2021   ALBUMIN 3.3 (L) 08/10/2021   ALKPHOS 58 08/10/2021   LIPASE 34 08/10/2021     Electrolytes Lab Results  Component Value Date   NA 136 08/26/2021   K 3.5 08/26/2021   CL 99 08/26/2021   CALCIUM 9.6 08/26/2021     Bone No results found for: "VD25OH", "VD125OH2TOT", "NU2725DG6", "YQ0347QQ5", "25OHVITD1", "25OHVITD2", "25OHVITD3", "TESTOFREE", "TESTOSTERONE"   Inflammation (CRP: Acute Phase) (ESR: Chronic Phase) Lab Results  Component Value Date   ESRSEDRATE 35 (H) 08/12/2021       Note: Above Lab results reviewed.  Recent Imaging Review  MR CERVICAL SPINE W WO  CONTRAST CLINICAL DATA:  Neck and occipital head pain. Previous cervical fusion procedures  EXAM: MRI CERVICAL SPINE WITHOUT AND WITH CONTRAST  TECHNIQUE: Multiplanar and multiecho pulse sequences of the cervical spine, to include the craniocervical junction and cervicothoracic junction, were obtained without and with intravenous contrast.  CONTRAST:  6mL GADAVIST GADOBUTROL 1 MMOL/ML IV SOLN  COMPARISON:  Radiography 02/27/2021.  MRI 06/15/2020  FINDINGS: Alignment: Straightening of the normal cervical lordosis  Vertebrae: Anterior and posterior fusion from C2 through C7. No sign of nonunion.  Cord: No cord compression or focal cord lesion. No evidence of myelopathy.  Posterior Fossa, vertebral arteries, paraspinal tissues: No evidence of significant degenerative disease at the skull base C1 articulation or the C1-C2 articulation.  Disc levels:  Foramen magnum widely patent. As noted above, there is a remarkable absence of visible degenerative change at the skull base C1 articulation and C1-C2 articulation.  Apparent sufficient patency of the canal from C2 through C7. Foraminal detail is limited by artifact from fusion hardware.  At C7-T1, there is bulging of the disc and bilateral facet osteoarthritis. No compressive canal stenosis. Foraminal narrowing could possibly affect the C8 nerves.  T1-2 and T2-3 show bulging of the disc but no compressive narrowing of the canal. There is some foraminal narrowing, more at T1-2 and T2-3.  IMPRESSION: 1. Anterior and posterior fusion from C2 through C7. No sign  of nonunion. 2. Apparent sufficient patency of the canal from C2 through C7. Foraminal detail is limited by artifact from fusion hardware. 3. C7-T1 disc bulge and facet osteoarthritis. Foraminal narrowing could possibly affect the C8 nerves. 4. Lesser degenerative changes at T1-2 and T2-3 with foraminal narrowing that could possibly affect the exiting T1 and T2  nerves.  Electronically Signed   By: Paulina Fusi M.D.   On: 05/20/2023 15:43  Note: Reviewed        Physical Exam  General appearance: Well nourished, well developed, and well hydrated. In no apparent acute distress Mental status: Alert, oriented x 3 (person, place, & time)       Respiratory: No evidence of acute respiratory distress Eyes: PERLA Vitals: BP 127/87   Pulse 87   Temp 98.1 F (36.7 C)   Ht 5\' 1"  (1.549 m)   Wt 152 lb (68.9 kg)   SpO2 99%   BMI 28.72 kg/m  BMI: Estimated body mass index is 28.72 kg/m as calculated from the following:   Height as of this encounter: 5\' 1"  (1.549 m).   Weight as of this encounter: 152 lb (68.9 kg). Ideal: Ideal body weight: 47.8 kg (105 lb 6.1 oz) Adjusted ideal body weight: 56.3 kg (124 lb 0.5 oz)  Cervical Spine Exam  Skin & Axial Inspection: Well healed scar from previous spine surgery detected Alignment: Symmetrical Functional ROM: Pain restricted ROM      Stability: No instability detected Muscle Tone/Strength: Functionally intact. No obvious neuro-muscular anomalies detected. Sensory (Neurological): Neurogenic pain pattern, occipital neuralgia Palpation: No palpable anomalies                          Upper Extremity (UE) Exam      Side: Right upper extremity   Side: Left upper extremity    Skin & Extremity Inspection: Skin color, temperature, and hair growth are WNL. No peripheral edema or cyanosis. No masses, redness, swelling, asymmetry, or associated skin lesions. No contractures.   Skin & Extremity Inspection: Skin color, temperature, and hair growth are WNL. No peripheral edema or cyanosis. No masses, redness, swelling, asymmetry, or associated skin lesions. No contractures.    Functional ROM: Unrestricted ROM           Functional ROM: Unrestricted ROM            Muscle Tone/Strength: Functionally intact. No obvious neuro-muscular anomalies detected.   Muscle Tone/Strength: Functionally intact. No obvious neuro-muscular  anomalies detected.    Sensory (Neurological): Unimpaired           Sensory (Neurological): Unimpaired            Palpation: No palpable anomalies               Palpation: No palpable anomalies                Provocative Test(s):  Phalen's test: deferred Tinel's test: deferred Apley's scratch test (touch opposite shoulder):  Action 1 (Across chest): deferred Action 2 (Overhead): deferred Action 3 (LB reach): deferred     Provocative Test(s):  Phalen's test: deferred Tinel's test: deferred Apley's scratch test (touch opposite shoulder):  Action 1 (Across chest): deferred Action 2 (Overhead): deferred Action 3 (LB reach): deferred       Assessment   Status Diagnosis  Persistent Persistent Persistent 1. Cervical spondylosis without myelopathy   2. Cervical myofascial pain syndrome   3. Bilateral occipital neuralgia   4. Hx of cervical  spine surgery   5. Cervical fusion syndrome (C2-T1)   6. Abdominal distension   7. Generalized abdominal pain   8. Therapeutic opioid-induced constipation (OIC)         Plan of Care   Kimberly [name] has a current medication list which includes the following long-term medication(s): gabapentin.  Pharmacotherapy (Medications Ordered): Meds ordered this encounter  Medications   oxyCODONE (OXY IR/ROXICODONE) 5 MG immediate release tablet    Sig: Take 1 tablet (5 mg total) by mouth every 6 (six) hours as needed for severe pain (pain score 7-10). Must last 30 days.    Dispense:  120 tablet    Refill:  0    Chronic Pain: STOP Act (Not applicable) Fill 1 day early if closed on refill date. Avoid benzodiazepines within 8 hours of opioids   oxyCODONE (OXY IR/ROXICODONE) 5 MG immediate release tablet    Sig: Take 1 tablet (5 mg total) by mouth every 6 (six) hours as needed for severe pain (pain score 7-10). Must last 30 days.    Dispense:  120 tablet    Refill:  0    Chronic Pain: STOP Act (Not applicable) Fill 1 day early if closed on  refill date. Avoid benzodiazepines within 8 hours of opioids   oxyCODONE (OXY IR/ROXICODONE) 5 MG immediate release tablet    Sig: Take 1 tablet (5 mg total) by mouth every 6 (six) hours as needed for severe pain (pain score 7-10). Must last 30 days.    Dispense:  120 tablet    Refill:  0    Chronic Pain: STOP Act (Not applicable) Fill 1 day early if closed on refill date. Avoid benzodiazepines within 8 hours of opioids   naloxegol oxalate (MOVANTIK) 12.5 MG TABS tablet    Sig: Take 1 tablet (12.5 mg total) by mouth daily. Do not break tablet. Take on empty stomach 1 hour before or 2 hours after a meal.    Dispense:  30 tablet    Refill:  2    Fill one day early if pharmacy is closed on scheduled refill date. Generic permitted. Do not send renewal requests.   Orders Placed This Encounter  Procedures   Ambulatory referral to Gastroenterology    Referral Priority:   Routine    Referral Type:   Consultation    Referral Reason:   Specialty Services Required    Number of Visits Requested:   1     Follow-up plan:   Return in about 15 weeks (around 09/03/2023) for MM, F2F.     cervical TPI, cervical facet medial branch nerve block, suprascapular nerve block, bilateral C4, C5, C6   cervical facet medial branch nerve block 07/04/2020: Not effective, do not repeat.        Recent Visits Date Type Provider Dept  03/05/23 Office Visit Edward Jolly, MD [facility]  Showing recent visits within past 90 days and meeting all other requirements Today's Visits Date Type Provider Dept  05/21/23 Office Visit Edward Jolly, MD [facility]  Showing today's visits and meeting all other requirements Future Appointments No visits were found meeting these conditions. Showing future appointments within next 90 days and meeting all other requirements  I discussed the assessment and treatment plan with the patient. The patient was provided an opportunity to ask questions and all were  answered. The patient agreed with the plan and demonstrated an understanding of the instructions.  Patient advised to call back or seek an in-person  evaluation if the symptoms or condition worsens.  Duration of encounter: 30 minutes.  Note by: Edward Jolly, MD Date: 05/21/2023; Time: 3:13 PM

## 2023-05-21 NOTE — Progress Notes (Signed)
Nursing Pain Medication Assessment:  Safety precautions to be maintained throughout the outpatient stay will include: orient to surroundings, keep bed in low position, maintain call bell within reach at all times, provide assistance with transfer out of bed and ambulation.  Medication Inspection Compliance: Pill count conducted under aseptic conditions, in front of the patient. Neither the pills nor the bottle was removed from the patient's sight at any time. Once count was completed pills were immediately returned to the patient in their original bottle.  Medication: Oxycodone IR Pill/Patch Count:  31 of 120 pills remain Pill/Patch Appearance: Markings consistent with prescribed medication Bottle Appearance: Standard pharmacy container. Clearly labeled. Filled Date: 81 / 7 / 2024 Last Medication intake:  TodaySafety precautions to be maintained throughout the outpatient stay will include: orient to surroundings, keep bed in low position, maintain call bell within reach at all times, provide assistance with transfer out of bed and ambulation.

## 2023-05-28 ENCOUNTER — Telehealth: Payer: Self-pay

## 2023-05-28 ENCOUNTER — Other Ambulatory Visit: Payer: Self-pay | Admitting: Obstetrics and Gynecology

## 2023-05-28 DIAGNOSIS — R102 Pelvic and perineal pain: Secondary | ICD-10-CM

## 2023-05-28 NOTE — Telephone Encounter (Signed)
The patient wants to transfer care to our office because she doesn't like the care there. The patient stated that the things that they had her to try has not worked. She has told them that it's not working, and they are not listening to hear. She said that she needs to get a provider that is going to listen to her. She stated that she scared she might have a tumor in her stomach. She stated that her PCP said it is because of her pain medication and the pain management provider said it is not the medication.

## 2023-05-29 ENCOUNTER — Telehealth: Payer: Self-pay | Admitting: Gastroenterology

## 2023-05-29 NOTE — Telephone Encounter (Signed)
Patient called in to check the status of her approval. Dr. Allegra Lai said that she will see her once for send option.

## 2023-05-30 ENCOUNTER — Encounter: Payer: Medicare Other | Admitting: Student in an Organized Health Care Education/Training Program

## 2023-06-12 NOTE — Progress Notes (Deleted)
06/14/2023 9:47 AM   Kimberly [name] Feb 17, 1961 [id number]  Referring provider: Marisue Ivan, MD [phone] [facility] [address] [facility] [place],  [place] [address]  Urological history: 1.  High risk hematuria -Non-smoker -CTU (08/25/2021) - Punctuate nonobstructive calculus of the superior pole of the left kidney -cysto/hydro distention w/ bilateral RTG's (08/2021) - Post dilation glomerulations were noted lower lateral and posterior walls/trigone -contrast CT (03/2022) - Mild urothelial enhancement of right ureter - urine culture positive for E.coli -RUS (10/2022) - normal  -cysto (11/2022) - NED   2. Interstitial cystitis -cysto/hydrodistention in February 2023 which noted post dilation glomerulations -Uribel, amitriptyline 25 mg and hydroxyzine 10 mg TID, prn   3.  Nephrolithiasis -CTU (07/2021) - Punctuate nonobstructive calculus of the superior pole of the left kidney  No chief complaint on file.   HPI: Kimberly [name] is a 62 y.o. female who presents today for 6 months follow up.   Previous records reviewed.   She is seen OB/GYN since her last visit with [place].  They are currently working her up for her vaginal pain.  She had an MRI of the pelvis yesterday, results still pending.  ***  PMH: Past Medical History:  Diagnosis Date   Anxiety    takes alprazolam - rare use    Arthritis    cervical spondylosis    Asthma    Balance problem    Brain cyst    Carpal tunnel syndrome, bilateral    Cervical fusion syndrome    Complete rupture of rotator cuff    Complication of anesthesia    lung collapsed after neck fusion   Degenerative disc disease, cervical    Depression    Difficult intubation    Small mouth opening, limited neck flexion, very anterior    Flu 08/10/2018   tested positive   GERD (gastroesophageal reflux disease)    pt. reports that its better, no meds in use at this time- 2015   Headache    Herpes    History of kidney stones     History of pneumonia    Hyperlipidemia    Hypertension    pt. doesn't see cardiologist, followed for HTN by Dr. Sherwood Gambler   Hypothyroidism    Inflammation of shoulder joint    Memory difficulties    Neuromuscular disorder (HCC)    joint and muscle problems   Occipital neuralgia    Pneumothorax on right    following C4-6 ACDF 07/04/16   Pseudoarthrosis of cervical spine (HCC)    Recurrent falls    Syncope    Urinary frequency     Surgical History: Past Surgical History:  Procedure Laterality Date   ABDOMINAL HYSTERECTOMY     ANTERIOR CERVICAL DECOMP/DISCECTOMY FUSION N/A 08/06/2014   Procedure: ANTERIOR CERVICAL DECOMPRESSION/DISCECTOMY FUSION CERVICAL THREE-FOUR,CERVICAL SIX-SEVEN ,CERVICAL SEVEN-THORACIC ONE;  Surgeon: Karn Cassis, MD;  Location: [facility] OR;  Service: Neurosurgery;  Laterality: N/A;   ANTERIOR CERVICAL DECOMP/DISCECTOMY FUSION N/A 07/04/2016   Procedure: CERVICAL FOUR-FIVE, CERVICAL FIVE-SIX ANTERIOR CERVICAL DECOMPRESSION/DISCECTOMY/FUSION WITH REVISION OF CERVICAL THREE-FOUR PLATE;  Surgeon: Hilda Lias, MD;  Location: [facility] OR;  Service: Neurosurgery;  Laterality: N/A;   CARPAL TUNNEL RELEASE Left 02/16/2013   Procedure: CARPAL TUNNEL RELEASE;  Surgeon: Vickki Hearing, MD;  Location: [facility] ORS;  Service: Orthopedics;  Laterality: Left;   CARPAL TUNNEL RELEASE Right 03/27/2013   Procedure: RIGHT CARPAL TUNNEL RELEASE;  Surgeon: Vickki Hearing, MD;  Location: [facility];  Service: Orthopedics;  Laterality: Right;  CESAREAN SECTION     x2   CHOLECYSTECTOMY N/A 06/17/2015   Procedure: LAPAROSCOPIC CHOLECYSTECTOMY;  Surgeon: Franky Macho, MD;  Location: [facility];  Service: General;  Laterality: N/A;   COLONOSCOPY     COLONOSCOPY WITH PROPOFOL N/A 05/06/2019   Procedure: COLONOSCOPY WITH PROPOFOL;  Surgeon: Earline Mayotte, MD;  Location: [facility] ENDOSCOPY;  Service: Endoscopy;  Laterality: N/A;   CYSTO WITH HYDRODISTENSION N/A 09/26/2021   Procedure:  CYSTOSCOPY/HYDRODISTENSION;  Surgeon: Riki Altes, MD;  Location: [facility] ORS;  Service: Urology;  Laterality: N/A;   CYSTOSCOPY W/ RETROGRADES Bilateral 09/26/2021   Procedure: CYSTOSCOPY WITH RETROGRADE PYELOGRAM;  Surgeon: Riki Altes, MD;  Location: [facility] ORS;  Service: Urology;  Laterality: Bilateral;   CYSTOSCOPY WITH BIOPSY N/A 09/26/2021   Procedure: CYSTOSCOPY WITH BIOPSY;  Surgeon: Riki Altes, MD;  Location: [facility] ORS;  Service: Urology;  Laterality: N/A;   ESOPHAGOGASTRODUODENOSCOPY N/A 05/08/2021   Procedure: ESOPHAGOGASTRODUODENOSCOPY (EGD);  Surgeon: Regis Bill, MD;  Location: [facility] ENDOSCOPY;  Service: Endoscopy;  Laterality: N/A;   ESOPHAGOGASTRODUODENOSCOPY  11/25/2017   EXCISION VAGINAL CYST N/A 12/22/2021   Procedure: EXCISION VULVAR CYST;  Surgeon: Christeen Douglas, MD;  Location: [facility] ORS;  Service: Gynecology;  Laterality: N/A;   EXTRACORPOREAL SHOCK WAVE LITHOTRIPSY Left 09/04/2018   Procedure: EXTRACORPOREAL SHOCK WAVE LITHOTRIPSY (ESWL);  Surgeon: Sondra Come, MD;  Location: [facility] ORS;  Service: Urology;  Laterality: Left;   FOREIGN BODY REMOVAL Left    knee-as child   KNEE ARTHROSCOPY     NASAL SINUS SURGERY N/A 04/13/2015   Procedure: nasal endoscopy with adenoid biopsy;  Surgeon: Melvenia Beam, MD;  Location: [facility] OR;  Service: ENT;  Laterality: N/A;   NECK SURGERY  2016   x 3 all together   POSTERIOR CERVICAL FUSION/FORAMINOTOMY N/A 11/13/2016   Procedure: CERVICAL TWO-CERVICAL SIX POSTERIOR CERVICAL FUSION WITH LATERAL MASS FIXATION;  Surgeon: Barnett Abu, MD;  Location: [facility] OR;  Service: Neurosurgery;  Laterality: N/A;  posterior approach   REDUCTION MAMMAPLASTY  10/2019   ROTATOR CUFF REPAIR Left 2014   SHOULDER ACROMIOPLASTY Left 05/18/2015   Procedure: SHOULDER ACROMIOPLASTY;  Surgeon: Frederico Hamman, MD;  Location: [facility];  Service: Orthopedics;  Laterality: Left;   SHOULDER ARTHROSCOPY WITH DISTAL CLAVICLE RESECTION  Left 05/18/2015   Procedure: LEFT SHOULDER ARTHROSCOPY WITH  DISTAL CLAVICLE RESECTION;  Surgeon: Frederico Hamman, MD;  Location: [facility];  Service: Orthopedics;  Laterality: Left;   URETEROSCOPY Bilateral 09/26/2021   Procedure: URETEROSCOPY;  Surgeon: Riki Altes, MD;  Location: [facility] ORS;  Service: Urology;  Laterality: Bilateral;   XI ROBOTIC ASSISTED OOPHORECTOMY N/A 12/22/2021   Procedure: XI ROBOTIC ASSISTED DIAGNOSTIC LAPAROSCOPY, LEFT OOPHORECTOMY, WITH RIGHT SALPINGECTOMY AND LYSIS OF ADHESIONS;  Surgeon: Christeen Douglas, MD;  Location: [facility] ORS;  Service: Gynecology;  Laterality: N/A;    Home Medications:  Allergies as of 06/14/2023       Reactions   Shellfish Allergy Anaphylaxis, Hives   "Swells throat up"   Peanut-containing Drug Products Hives   Codeine Other (See Comments)   jitters   Diphtheria Toxoid Rash   Tetanus Toxoids Rash        Medication List        Accurate as of June 12, 2023  9:47 AM. If you have any questions, ask your nurse or doctor.          acetaminophen 500 MG tablet Commonly known as: TYLENOL Take 500 mg by mouth 2 (two) times daily  as needed for headache.   amLODipine 5 MG tablet Commonly known as: NORVASC Take 5 mg by mouth daily.   cholecalciferol 25 MCG (1000 UNIT) tablet Commonly known as: VITAMIN D3 Take 1,000 Units by mouth daily.   cyclobenzaprine 10 MG tablet Commonly known as: FLEXERIL Take 10 mg by mouth 3 (three) times daily as needed for muscle spasms.   estradiol 1 MG tablet Commonly known as: ESTRACE Take 1.5 mg by mouth daily.   fluconazole 100 MG tablet Commonly known as: DIFLUCAN Take 100 mg by mouth daily.   gabapentin 600 MG tablet Commonly known as: Neurontin Take 1 tablet (600 mg total) by mouth 3 (three) times daily.   hydrOXYzine 10 MG tablet Commonly known as: ATARAX Take 1 tablet (10 mg total) by mouth 3 (three) times daily as needed.   ibuprofen 600 MG  tablet Commonly known as: ADVIL Take 600 mg by mouth 3 (three) times daily.   liothyronine 5 MCG tablet Commonly known as: CYTOMEL Take 5 mcg by mouth daily.   meloxicam 15 MG tablet Commonly known as: MOBIC Take 15 mg by mouth daily as needed for pain.   naloxegol oxalate 12.5 MG Tabs tablet Commonly known as: MOVANTIK Take 1 tablet (12.5 mg total) by mouth daily. Do not break tablet. Take on empty stomach 1 hour before or 2 hours after a meal.   NONFORMULARY OR COMPOUNDED ITEM Place 10 mg vaginally at bedtime as needed (spasms). Diazepam 10 mg   oxyCODONE 5 MG immediate release tablet Commonly known as: Oxy IR/ROXICODONE Take 1 tablet (5 mg total) by mouth every 6 (six) hours as needed for severe pain (pain score 7-10). Must last 30 days.   oxyCODONE 5 MG immediate release tablet Commonly known as: Oxy IR/ROXICODONE Take 1 tablet (5 mg total) by mouth every 6 (six) hours as needed for severe pain (pain score 7-10). Must last 30 days. Start taking on: July 05, 2023   oxyCODONE 5 MG immediate release tablet Commonly known as: Oxy IR/ROXICODONE Take 1 tablet (5 mg total) by mouth every 6 (six) hours as needed for severe pain (pain score 7-10). Must last 30 days. Start taking on: August 04, 2023   pantoprazole 40 MG tablet Commonly known as: PROTONIX Take 40 mg by mouth every morning.   phenazopyridine 200 MG tablet Commonly known as: Pyridium Take 1 tablet (200 mg total) by mouth 3 (three) times daily as needed for pain.   Premarin vaginal cream Generic drug: conjugated estrogens Apply 0.5mg  (pea-sized amount)  just inside the vaginal introitus with a finger-tip on  Monday, Wednesday and Friday nights.   rizatriptan 10 MG tablet Commonly known as: MAXALT Take 10 mg by mouth as needed for migraine. May repeat in 2 hours if needed   timolol 0.25 % ophthalmic solution Commonly known as: TIMOPTIC Place 1 drop into the right eye daily.   Uro-MP 118 MG Caps Take one  every 6 hours prn for dysuria   valACYclovir 1000 MG tablet Commonly known as: VALTREX Take 1,000 mg by mouth daily.   VISINE OP Apply 1 drop to eye daily as needed (irritation).        Allergies:  Allergies  Allergen Reactions   Shellfish Allergy Anaphylaxis and Hives    "Swells throat up"   Peanut-Containing Drug Products Hives   Codeine Other (See Comments)    jitters   Diphtheria Toxoid Rash   Tetanus Toxoids Rash    Family History: Family History  Problem Relation Age  of Onset   Cancer Father        prostate cancer   Hypertension Father    Hypertension Other    Breast cancer Neg Hx     Social History:  reports that she has never smoked. She has never used smokeless tobacco. She reports current alcohol use. She reports that she does not use drugs.  ROS: Pertinent ROS in HPI  Physical Exam: There were no vitals taken for this visit.  Constitutional:  Well nourished. Alert and oriented, No acute distress. HEENT: [place] AT, moist mucus membranes.  Trachea midline, no masses. Cardiovascular: No clubbing, cyanosis, or edema. Respiratory: Normal respiratory effort, no increased work of breathing. GU: No CVA tenderness.  No bladder fullness or masses.  Recession of labia minora, dry, pale vulvar vaginal mucosa and loss of mucosal ridges and folds.  Normal urethral meatus, no lesions, no prolapse, no discharge.   No urethral masses, tenderness and/or tenderness. No bladder fullness, tenderness or masses. *** vagina mucosa, *** estrogen effect, no discharge, no lesions, *** pelvic support, *** cystocele and *** rectocele noted.  No cervical motion tenderness.  Uterus is freely mobile and non-fixed.  No adnexal/parametria masses or tenderness noted.  Anus and perineum are without rashes or lesions.   ***  Neurologic: Grossly intact, no focal deficits, moving all 4 extremities. Psychiatric: Normal mood and affect.    Laboratory Data: Comprehensive Metabolic Panel (CMP) Order:  [id number] Component Ref Range & Units 2 mo ago  Glucose 70 - 110 mg/dL 782  Sodium 956 - 213 mmol/L 139  Potassium 3.6 - 5.1 mmol/L 3.9  Chloride 97 - 109 mmol/L 102  Carbon Dioxide (CO2) 22.0 - 32.0 mmol/L 27.4  Urea Nitrogen (BUN) 7 - 25 mg/dL 12  Creatinine 0.6 - 1.1 mg/dL 0.9  Glomerular Filtration Rate (eGFR) >60 mL/min/1.73sq m 72  Comment: CKD-EPI (2021) does not include patient's race in the calculation of eGFR.  Monitoring changes of plasma creatinine and eGFR over time is useful for monitoring kidney function.  Interpretive Ranges for eGFR (CKD-EPI 2021):  eGFR:       >60 mL/min/1.73 sq. m - Normal eGFR:       30-59 mL/min/1.73 sq. m - Moderately Decreased eGFR:       15-29 mL/min/1.73 sq. m  - Severely Decreased eGFR:       < 15 mL/min/1.73 sq. m  - Kidney Failure   Note: These eGFR calculations do not apply in acute situations when eGFR is changing rapidly or patients on dialysis.  Calcium 8.7 - 10.3 mg/dL 9.6  AST 8 - 39 U/L 16  ALT 5 - 38 U/L 9  Alk Phos (alkaline Phosphatase) 34 - 104 U/L 84  Albumin 3.5 - 4.8 g/dL 3.8  Bilirubin, Total 0.3 - 1.2 mg/dL 0.4  Protein, Total 6.1 - 7.9 g/dL 7.2  A/G Ratio 1.0 - 5.0 gm/dL 1.1  Resulting Agency [facility]   Specimen Collected: 04/09/23 14:44   Performed by: [facility] Last Resulted: 04/09/23 16:49  Received From: [facility]  Result Received: 04/24/23 12:59  I have reviewed the labs.   Pertinent Imaging: N/A   Assessment & Plan:    1. High risk hematuria -non smoker -recent work up negative  2. Vaginal atrophy/urethral caruncle -continue vaginal estrogen cream on Monday, Wednesday and Friday  3. RBC casts -see nephrology -has follow up later this month   4. Frequency -May be exacerbation of her IC versus  urinary tract infection -cultures pending  No follow-ups on file.  These notes generated with voice recognition software. I  apologize for typographical errors.  Cloretta Ned  [facility] [address]  [address] [place], [place] [address] [phone]

## 2023-06-13 ENCOUNTER — Ambulatory Visit
Admission: RE | Admit: 2023-06-13 | Discharge: 2023-06-13 | Disposition: A | Discharge status: Home / Self Care | Payer: 59 | Source: Ambulatory Visit | Attending: Obstetrics and Gynecology | Admitting: Obstetrics and Gynecology

## 2023-06-13 DIAGNOSIS — R102 Pelvic and perineal pain: Secondary | ICD-10-CM

## 2023-06-13 MED ORDER — GADOPICLENOL 0.5 MMOL/ML IV SOLN
7.5000 mL | Freq: Once | INTRAVENOUS | Status: AC | PRN
  Administered 2023-06-13: 7 mL via INTRAVENOUS

## 2023-06-14 ENCOUNTER — Ambulatory Visit: Payer: 59 | Admitting: Urology

## 2023-06-14 DIAGNOSIS — N952 Postmenopausal atrophic vaginitis: Secondary | ICD-10-CM

## 2023-06-14 DIAGNOSIS — R350 Frequency of micturition: Secondary | ICD-10-CM

## 2023-06-14 DIAGNOSIS — R82998 Other abnormal findings in urine: Secondary | ICD-10-CM

## 2023-06-14 DIAGNOSIS — R319 Hematuria, unspecified: Secondary | ICD-10-CM

## 2023-06-21 NOTE — Progress Notes (Unsigned)
06/25/2023 8:50 AM   Arnette Schaumann October 29, 1960 [id number]  Referring provider: Marisue Ivan, MD [id number] [place] [address] [facility] [place],  [place] [address]  Urological history: 1.  High risk hematuria -Non-smoker -CTU (08/25/2021) - Punctuate nonobstructive calculus of the superior pole of the left kidney -cysto/hydro distention w/ bilateral RTG's (08/2021) - Post dilation glomerulations were noted lower lateral and posterior walls/trigone -contrast CT (03/2022) - Mild urothelial enhancement of right ureter - urine culture positive for E.coli -RUS (10/2022) - normal  -cysto (11/2022) - NED   2. Interstitial cystitis -cysto/hydrodistention in February 2023 which noted post dilation glomerulations -Uribel, amitriptyline 25 mg and hydroxyzine 10 mg TID, prn   3.  Nephrolithiasis -CTU (07/2021) - Punctuate nonobstructive calculus of the superior pole of the left kidney  No chief complaint on file.   HPI: Kimberly [name] is a 62 y.o. female who presents today for 6 months follow up.   Previous records reviewed.   She is seen OB/GYN since her last visit with [place].  They are currently working her up for her vaginal pain.  She had an MRI of the pelvis yesterday, results still pending.  ***  PMH: Past Medical History:  Diagnosis Date   Anxiety    takes alprazolam - rare use    Arthritis    cervical spondylosis    Asthma    Balance problem    Brain cyst    Carpal tunnel syndrome, bilateral    Cervical fusion syndrome    Complete rupture of rotator cuff    Complication of anesthesia    lung collapsed after neck fusion   Degenerative disc disease, cervical    Depression    Difficult intubation    Small mouth opening, limited neck flexion, very anterior    Flu 08/10/2018   tested positive   GERD (gastroesophageal reflux disease)    pt. reports that its better, no meds in use at this time- 2015   Headache    Herpes    History of kidney stones     History of pneumonia    Hyperlipidemia    Hypertension    pt. doesn't see cardiologist, followed for HTN by Dr. Sherwood Gambler   Hypothyroidism    Inflammation of shoulder joint    Memory difficulties    Neuromuscular disorder (HCC)    joint and muscle problems   Occipital neuralgia    Pneumothorax on right    following C4-6 ACDF 07/04/16   Pseudoarthrosis of cervical spine (HCC)    Recurrent falls    Syncope    Urinary frequency     Surgical History: Past Surgical History:  Procedure Laterality Date   ABDOMINAL HYSTERECTOMY     ANTERIOR CERVICAL DECOMP/DISCECTOMY FUSION N/A 08/06/2014   Procedure: ANTERIOR CERVICAL DECOMPRESSION/DISCECTOMY FUSION CERVICAL THREE-FOUR,CERVICAL SIX-SEVEN ,CERVICAL SEVEN-THORACIC ONE;  Surgeon: Karn Cassis, MD;  Location: [facility] OR;  Service: Neurosurgery;  Laterality: N/A;   ANTERIOR CERVICAL DECOMP/DISCECTOMY FUSION N/A 07/04/2016   Procedure: CERVICAL FOUR-FIVE, CERVICAL FIVE-SIX ANTERIOR CERVICAL DECOMPRESSION/DISCECTOMY/FUSION WITH REVISION OF CERVICAL THREE-FOUR PLATE;  Surgeon: Hilda Lias, MD;  Location: [facility] OR;  Service: Neurosurgery;  Laterality: N/A;   CARPAL TUNNEL RELEASE Left 02/16/2013   Procedure: CARPAL TUNNEL RELEASE;  Surgeon: Vickki Hearing, MD;  Location: [facility]S;  Service: Orthopedics;  Laterality: Left;   CARPAL TUNNEL RELEASE Right 03/27/2013   Procedure: RIGHT CARPAL TUNNEL RELEASE;  Surgeon: Vickki Hearing, MD;  Location: [facility];  Service: Orthopedics;  Laterality: Right;  CESAREAN SECTION     x2   CHOLECYSTECTOMY N/A 06/17/2015   Procedure: LAPAROSCOPIC CHOLECYSTECTOMY;  Surgeon: Franky Macho, MD;  Location: [facility];  Service: General;  Laterality: N/A;   COLONOSCOPY     COLONOSCOPY WITH PROPOFOL N/A 05/06/2019   Procedure: COLONOSCOPY WITH PROPOFOL;  Surgeon: Earline Mayotte, MD;  Location: [facility]COPY;  Service: Endoscopy;  Laterality: N/A;   CYSTO WITH HYDRODISTENSION N/A 09/26/2021   Procedure:  CYSTOSCOPY/HYDRODISTENSION;  Surgeon: Riki Altes, MD;  Location: [facility] ORS;  Service: Urology;  Laterality: N/A;   CYSTOSCOPY W/ RETROGRADES Bilateral 09/26/2021   Procedure: CYSTOSCOPY WITH RETROGRADE PYELOGRAM;  Surgeon: Riki Altes, MD;  Location: [facility] ORS;  Service: Urology;  Laterality: Bilateral;   CYSTOSCOPY WITH BIOPSY N/A 09/26/2021   Procedure: CYSTOSCOPY WITH BIOPSY;  Surgeon: Riki Altes, MD;  Location: [facility] ORS;  Service: Urology;  Laterality: N/A;   ESOPHAGOGASTRODUODENOSCOPY N/A 05/08/2021   Procedure: ESOPHAGOGASTRODUODENOSCOPY (EGD);  Surgeon: Regis Bill, MD;  Location: [facility];  Service: Endoscopy;  Laterality: N/A;   ESOPHAGOGASTRODUODENOSCOPY  11/25/2017   EXCISION VAGINAL CYST N/A 12/22/2021   Procedure: EXCISION VULVAR CYST;  Surgeon: Christeen Douglas, MD;  Location: [facility] ORS;  Service: Gynecology;  Laterality: N/A;   EXTRACORPOREAL SHOCK WAVE LITHOTRIPSY Left 09/04/2018   Procedure: EXTRACORPOREAL SHOCK WAVE LITHOTRIPSY (ESWL);  Surgeon: Sondra Come, MD;  Location: [facility] ORS;  Service: Urology;  Laterality: Left;   FOREIGN BODY REMOVAL Left    knee-as child   KNEE ARTHROSCOPY     NASAL SINUS SURGERY N/A 04/13/2015   Procedure: nasal endoscopy with adenoid biopsy;  Surgeon: Melvenia Beam, MD;  Location: [facility] OR;  Service: ENT;  Laterality: N/A;   NECK SURGERY  2016   x 3 all together   POSTERIOR CERVICAL FUSION/FORAMINOTOMY N/A 11/13/2016   Procedure: CERVICAL TWO-CERVICAL SIX POSTERIOR CERVICAL FUSION WITH LATERAL MASS FIXATION;  Surgeon: Barnett Abu, MD;  Location: [facility] OR;  Service: Neurosurgery;  Laterality: N/A;  posterior approach   REDUCTION MAMMAPLASTY  10/2019   ROTATOR CUFF REPAIR Left 2014   SHOULDER ACROMIOPLASTY Left 05/18/2015   Procedure: SHOULDER ACROMIOPLASTY;  Surgeon: Frederico Hamman, MD;  Location: [facility];  Service: Orthopedics;  Laterality: Left;   SHOULDER ARTHROSCOPY WITH DISTAL CLAVICLE RESECTION  Left 05/18/2015   Procedure: LEFT SHOULDER ARTHROSCOPY WITH  DISTAL CLAVICLE RESECTION;  Surgeon: Frederico Hamman, MD;  Location: [facility];  Service: Orthopedics;  Laterality: Left;   URETEROSCOPY Bilateral 09/26/2021   Procedure: URETEROSCOPY;  Surgeon: Riki Altes, MD;  Location: [facility] ORS;  Service: Urology;  Laterality: Bilateral;   XI ROBOTIC ASSISTED OOPHORECTOMY N/A 12/22/2021   Procedure: XI ROBOTIC ASSISTED DIAGNOSTIC LAPAROSCOPY, LEFT OOPHORECTOMY, WITH RIGHT SALPINGECTOMY AND LYSIS OF ADHESIONS;  Surgeon: Christeen Douglas, MD;  Location: [facility] ORS;  Service: Gynecology;  Laterality: N/A;    Home Medications:  Allergies as of 06/25/2023       Reactions   Shellfish Allergy Anaphylaxis, Hives   "Swells throat up"   Peanut-containing Drug Products Hives   Codeine Other (See Comments)   jitters   Diphtheria Toxoid Rash   Tetanus Toxoids Rash        Medication List        Accurate as of June 21, 2023  8:50 AM. If you have any questions, ask your nurse or doctor.          acetaminophen 500 MG tablet Commonly known as: TYLENOL Take 500 mg by mouth 2 (two) times daily  as needed for headache.   amLODipine 5 MG tablet Commonly known as: NORVASC Take 5 mg by mouth daily.   cholecalciferol 25 MCG (1000 UNIT) tablet Commonly known as: VITAMIN D3 Take 1,000 Units by mouth daily.   cyclobenzaprine 10 MG tablet Commonly known as: FLEXERIL Take 10 mg by mouth 3 (three) times daily as needed for muscle spasms.   estradiol 1 MG tablet Commonly known as: ESTRACE Take 1.5 mg by mouth daily.   fluconazole 100 MG tablet Commonly known as: DIFLUCAN Take 100 mg by mouth daily.   gabapentin 600 MG tablet Commonly known as: Neurontin Take 1 tablet (600 mg total) by mouth 3 (three) times daily.   hydrOXYzine 10 MG tablet Commonly known as: ATARAX Take 1 tablet (10 mg total) by mouth 3 (three) times daily as needed.   ibuprofen 600 MG  tablet Commonly known as: ADVIL Take 600 mg by mouth 3 (three) times daily.   liothyronine 5 MCG tablet Commonly known as: CYTOMEL Take 5 mcg by mouth daily.   meloxicam 15 MG tablet Commonly known as: MOBIC Take 15 mg by mouth daily as needed for pain.   naloxegol oxalate 12.5 MG Tabs tablet Commonly known as: MOVANTIK Take 1 tablet (12.5 mg total) by mouth daily. Do not break tablet. Take on empty stomach 1 hour before or 2 hours after a meal.   NONFORMULARY OR COMPOUNDED ITEM Place 10 mg vaginally at bedtime as needed (spasms). Diazepam 10 mg   oxyCODONE 5 MG immediate release tablet Commonly known as: Oxy IR/ROXICODONE Take 1 tablet (5 mg total) by mouth every 6 (six) hours as needed for severe pain (pain score 7-10). Must last 30 days.   oxyCODONE 5 MG immediate release tablet Commonly known as: Oxy IR/ROXICODONE Take 1 tablet (5 mg total) by mouth every 6 (six) hours as needed for severe pain (pain score 7-10). Must last 30 days. Start taking on: July 05, 2023   oxyCODONE 5 MG immediate release tablet Commonly known as: Oxy IR/ROXICODONE Take 1 tablet (5 mg total) by mouth every 6 (six) hours as needed for severe pain (pain score 7-10). Must last 30 days. Start taking on: August 04, 2023   pantoprazole 40 MG tablet Commonly known as: PROTONIX Take 40 mg by mouth every morning.   phenazopyridine 200 MG tablet Commonly known as: Pyridium Take 1 tablet (200 mg total) by mouth 3 (three) times daily as needed for pain.   Premarin vaginal cream Generic drug: conjugated estrogens Apply 0.5mg  (pea-sized amount)  just inside the vaginal introitus with a finger-tip on  Monday, Wednesday and Friday nights.   rizatriptan 10 MG tablet Commonly known as: MAXALT Take 10 mg by mouth as needed for migraine. May repeat in 2 hours if needed   timolol 0.25 % ophthalmic solution Commonly known as: TIMOPTIC Place 1 drop into the right eye daily.   Uro-MP 118 MG Caps Take one  every 6 hours prn for dysuria   valACYclovir 1000 MG tablet Commonly known as: VALTREX Take 1,000 mg by mouth daily.   VISINE OP Apply 1 drop to eye daily as needed (irritation).        Allergies:  Allergies  Allergen Reactions   Shellfish Allergy Anaphylaxis and Hives    "Swells throat up"   Peanut-Containing Drug Products Hives   Codeine Other (See Comments)    jitters   Diphtheria Toxoid Rash   Tetanus Toxoids Rash    Family History: Family History  Problem Relation Age  of Onset   Cancer Father        prostate cancer   Hypertension Father    Hypertension Other    Breast cancer Neg Hx     Social History:  reports that she has never smoked. She has never used smokeless tobacco. She reports current alcohol use. She reports that she does not use drugs.  ROS: Pertinent ROS in HPI  Physical Exam: There were no vitals taken for this visit.  Constitutional:  Well nourished. Alert and oriented, No acute distress. HEENT: [place] AT, moist mucus membranes.  Trachea midline, no masses. Cardiovascular: No clubbing, cyanosis, or edema. Respiratory: Normal respiratory effort, no increased work of breathing. GU: No CVA tenderness.  No bladder fullness or masses.  Recession of labia minora, dry, pale vulvar vaginal mucosa and loss of mucosal ridges and folds.  Normal urethral meatus, no lesions, no prolapse, no discharge.   No urethral masses, tenderness and/or tenderness. No bladder fullness, tenderness or masses. *** vagina mucosa, *** estrogen effect, no discharge, no lesions, *** pelvic support, *** cystocele and *** rectocele noted.  No cervical motion tenderness.  Uterus is freely mobile and non-fixed.  No adnexal/parametria masses or tenderness noted.  Anus and perineum are without rashes or lesions.   ***  Neurologic: Grossly intact, no focal deficits, moving all 4 extremities. Psychiatric: Normal mood and affect.    Laboratory Data: Comprehensive Metabolic Panel (CMP) Order:  [id number] Component Ref Range & Units 2 mo ago  Glucose 70 - 110 mg/dL 782  Sodium 956 - 213 mmol/L 139  Potassium 3.6 - 5.1 mmol/L 3.9  Chloride 97 - 109 mmol/L 102  Carbon Dioxide (CO2) 22.0 - 32.0 mmol/L 27.4  Urea Nitrogen (BUN) 7 - 25 mg/dL 12  Creatinine 0.6 - 1.1 mg/dL 0.9  Glomerular Filtration Rate (eGFR) >60 mL/min/1.73sq m 72  Comment: CKD-EPI (2021) does not include patient's race in the calculation of eGFR.  Monitoring changes of plasma creatinine and eGFR over time is useful for monitoring kidney function.  Interpretive Ranges for eGFR (CKD-EPI 2021):  eGFR:       >60 mL/min/1.73 sq. m - Normal eGFR:       30-59 mL/min/1.73 sq. m - Moderately Decreased eGFR:       15-29 mL/min/1.73 sq. m  - Severely Decreased eGFR:       < 15 mL/min/1.73 sq. m  - Kidney Failure   Note: These eGFR calculations do not apply in acute situations when eGFR is changing rapidly or patients on dialysis.  Calcium 8.7 - 10.3 mg/dL 9.6  AST 8 - 39 U/L 16  ALT 5 - 38 U/L 9  Alk Phos (alkaline Phosphatase) 34 - 104 U/L 84  Albumin 3.5 - 4.8 g/dL 3.8  Bilirubin, Total 0.3 - 1.2 mg/dL 0.4  Protein, Total 6.1 - 7.9 g/dL 7.2  A/G Ratio 1.0 - 5.0 gm/dL 1.1  Resulting Agency [facility]   Specimen Collected: 04/09/23 14:44   Performed by: [facility] Last Resulted: 04/09/23 16:49  Received From: [facility]  Result Received: 04/24/23 12:59  I have reviewed the labs.   Pertinent Imaging: N/A   Assessment & Plan:    1. High risk hematuria -non smoker -recent work up negative  2. Vaginal atrophy/urethral caruncle -continue vaginal estrogen cream on Monday, Wednesday and Friday  3. RBC casts -see nephrology -has follow up later this month   4. Frequency -May be exacerbation of her IC versus  urinary tract infection -cultures pending  No follow-ups on file.  These notes generated with voice recognition software. I  apologize for typographical errors.  Cloretta Ned  [facility] [address]  [address] [place], [place] [address] [phone]

## 2023-06-25 ENCOUNTER — Ambulatory Visit: Payer: 59 | Admitting: Urology

## 2023-06-25 ENCOUNTER — Encounter: Payer: Self-pay | Admitting: Urology

## 2023-06-25 VITALS — BP 145/87 | HR 103 | Ht 61.0 in | Wt 161.0 lb

## 2023-06-25 DIAGNOSIS — R102 Pelvic and perineal pain: Secondary | ICD-10-CM | POA: Diagnosis not present

## 2023-06-25 DIAGNOSIS — N952 Postmenopausal atrophic vaginitis: Secondary | ICD-10-CM

## 2023-06-27 IMAGING — CR DG ABDOMEN 1V
1 series · 1 of 1 positions shown · non-contrast
Comparison: 02/08/2020

CLINICAL DATA: Nephrolithiasis.

EXAM:
ABDOMEN - 1 VIEW

[t abdomen supine]
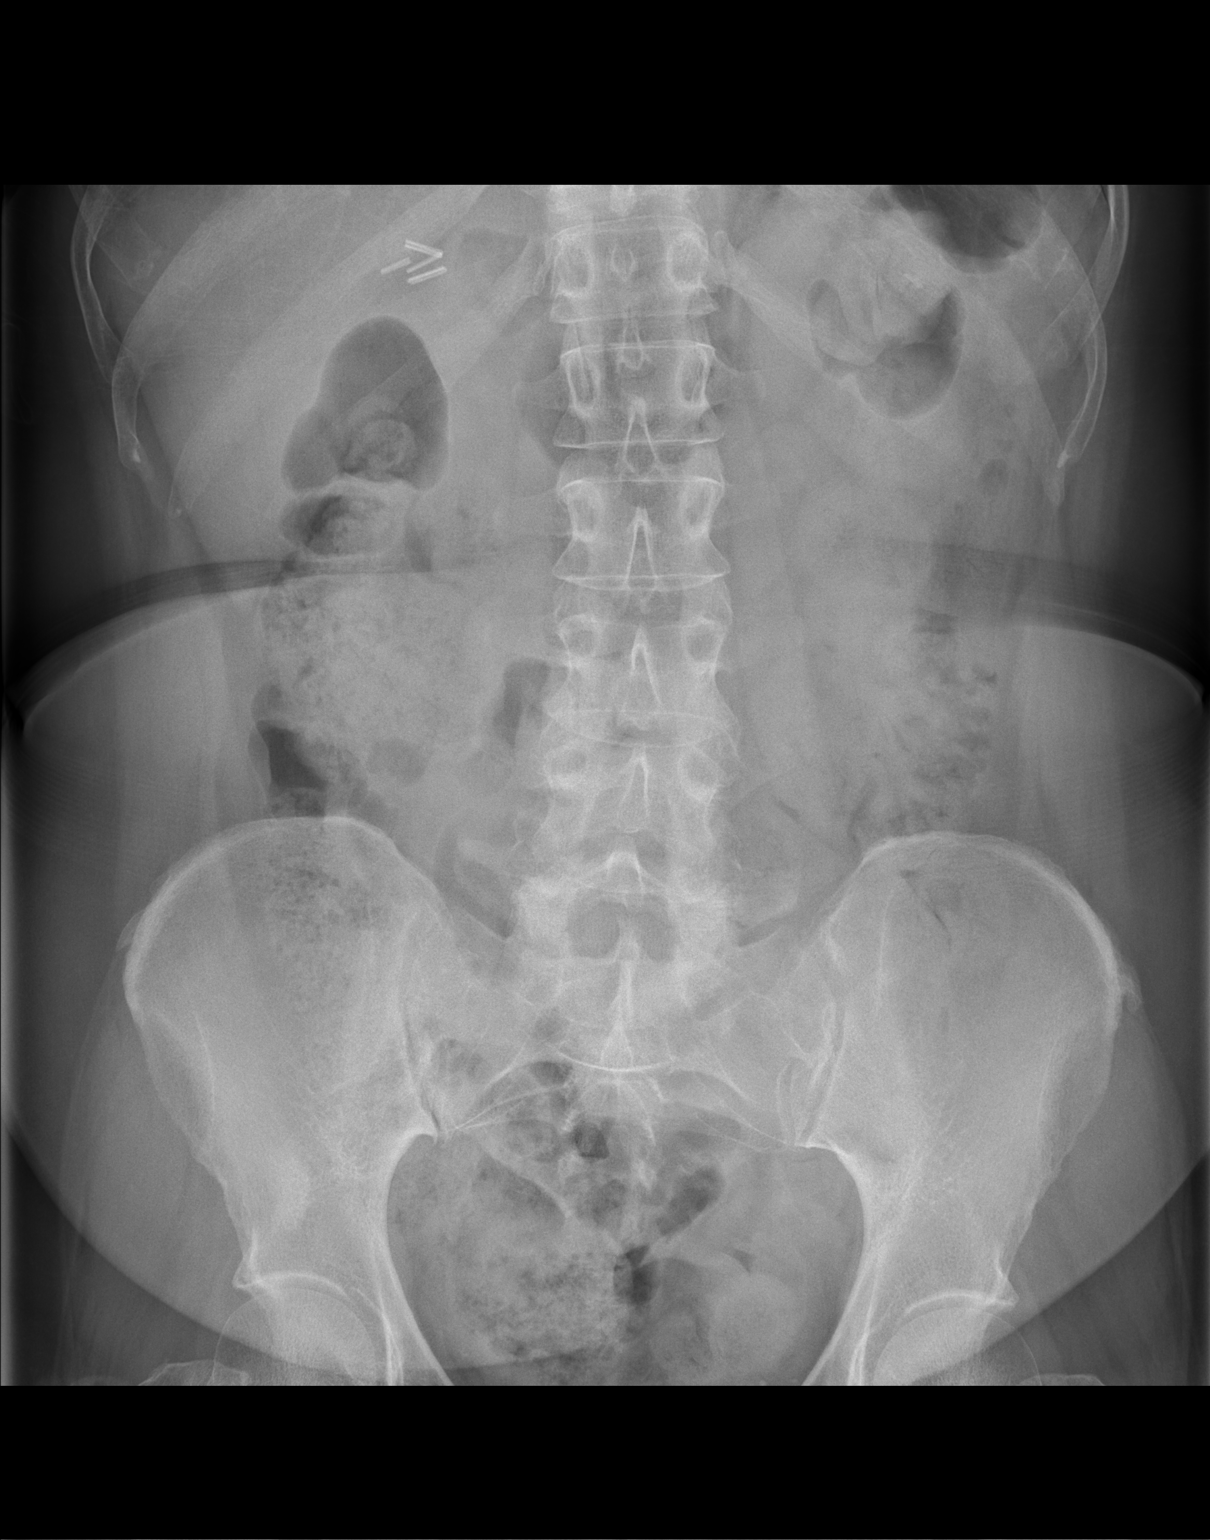

[1 of 1 positions shown; findings below may reference images not displayed]

FINDINGS: Normal bowel gas pattern. Moderate amount of stool throughout the
colon without significant change. Cholecystectomy clips. Stable
bilateral pelvic phleboliths. No calcified urinary tract calculi
seen. Unremarkable bones.
IMPRESSION: 1. No calcified urinary tract calculi seen.
2. Moderate stool throughout the colon without significant change.

## 2023-07-15 ENCOUNTER — Telehealth: Payer: Self-pay | Admitting: Student in an Organized Health Care Education/Training Program

## 2023-07-15 DIAGNOSIS — Q761 Klippel-Feil syndrome: Secondary | ICD-10-CM

## 2023-07-15 DIAGNOSIS — M47812 Spondylosis without myelopathy or radiculopathy, cervical region: Secondary | ICD-10-CM

## 2023-07-15 DIAGNOSIS — M5481 Occipital neuralgia: Secondary | ICD-10-CM

## 2023-07-15 NOTE — Telephone Encounter (Signed)
PT called asks if Lateef will send over a referral to Dr. Barnett Abu (neurosurgery). Fax no is [phone] T[place]

## 2023-07-16 IMAGING — CR DG CERVICAL SPINE WITH FLEX & EXTEND
1 series · 8 of 9 positions shown · non-contrast
Comparison: 08/14/2017

CLINICAL DATA: Cervicalgia, multiple cervical spine surgeries

EXAM:
CERVICAL SPINE COMPLETE WITH FLEXION AND EXTENSION VIEWS

[Series 1: dg cervical spine with flex & extend · 0.14mm/px · 8 of 9 slices shown]
[im 1/9]
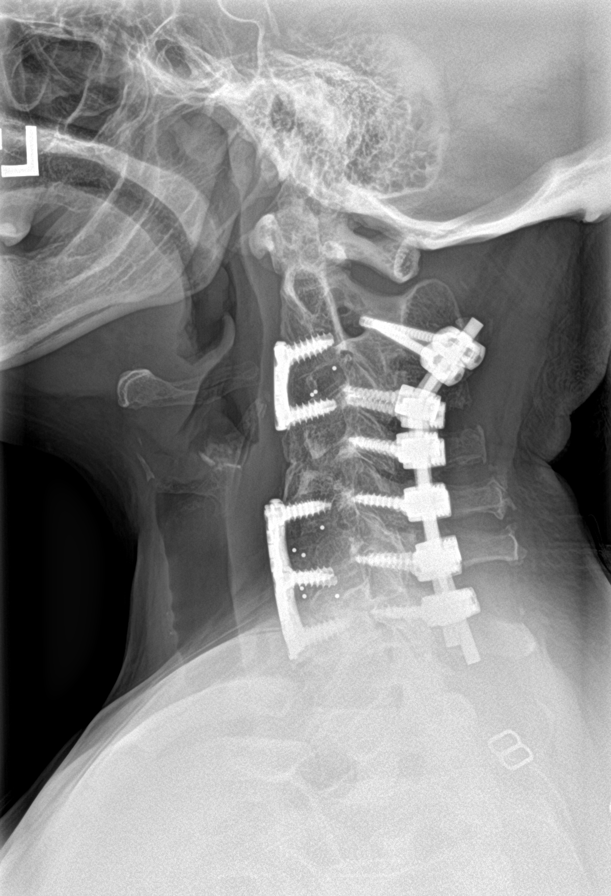
[im 2/9]
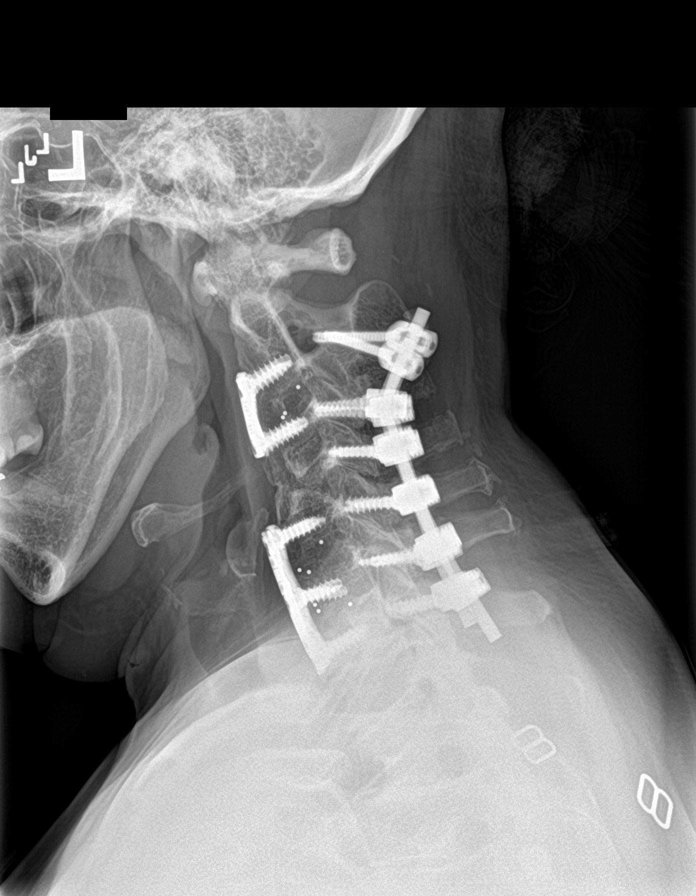
[im 3/9]
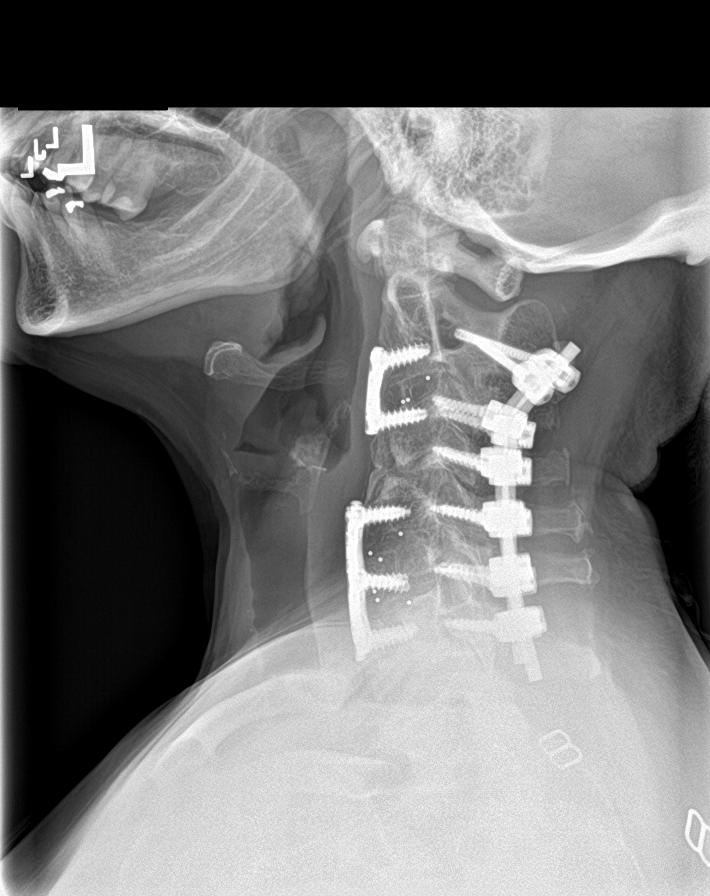
[im 4/9]
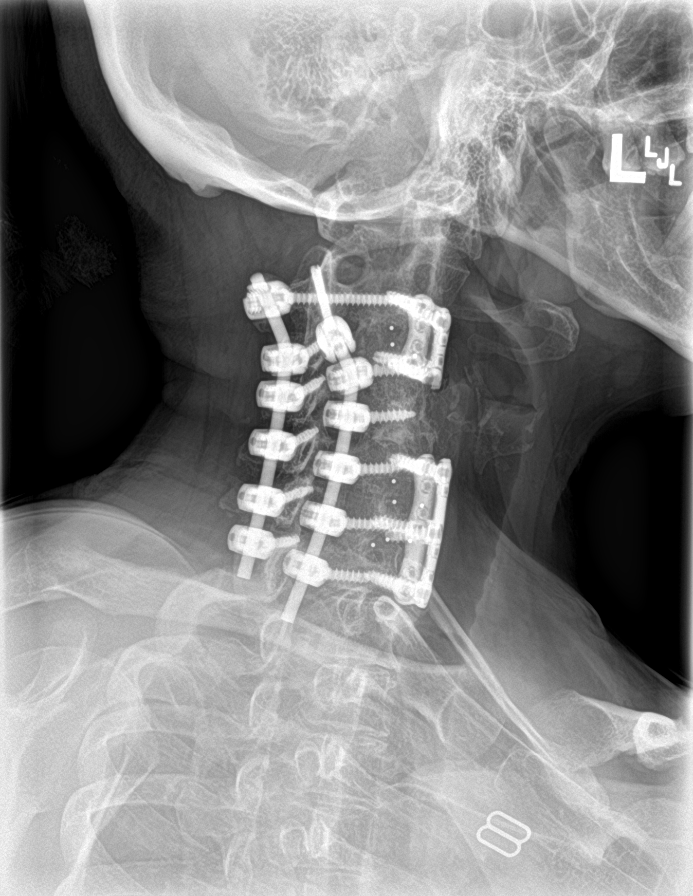
[im 5/9]
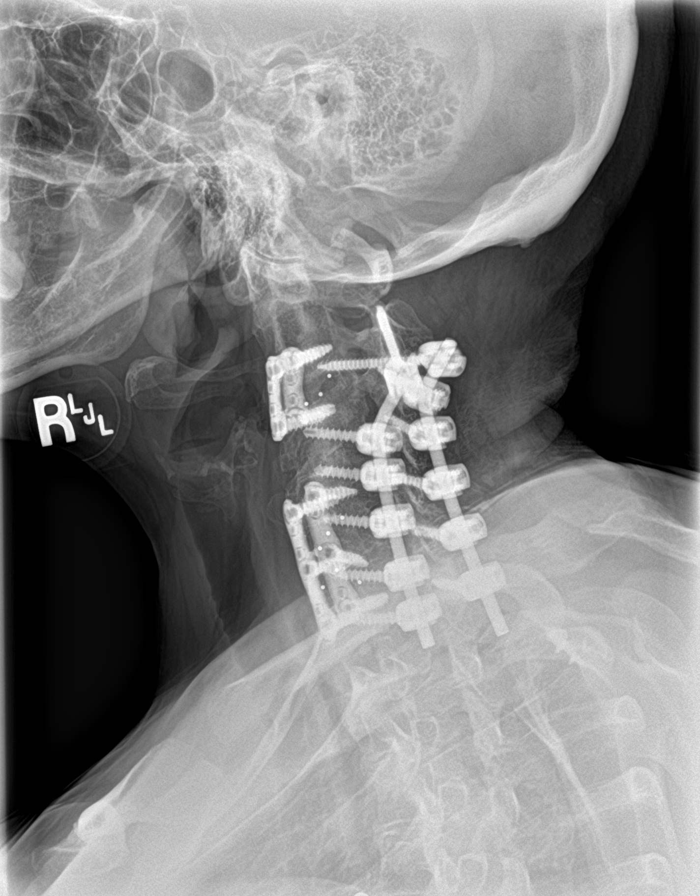
[im 6/9]
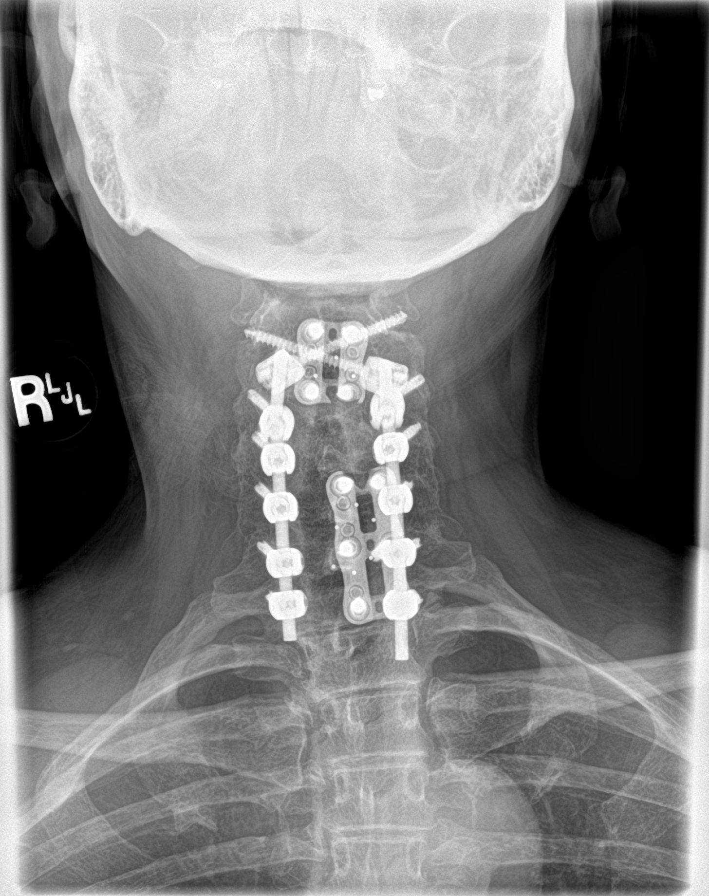
[im 7/9]
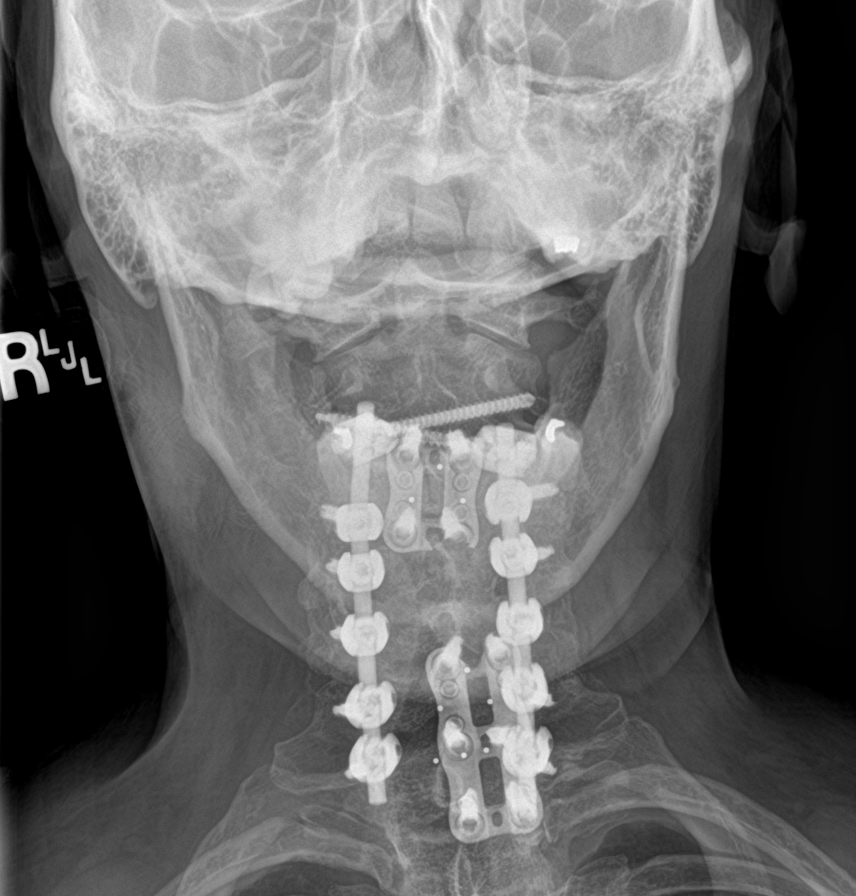
[im 8/9]
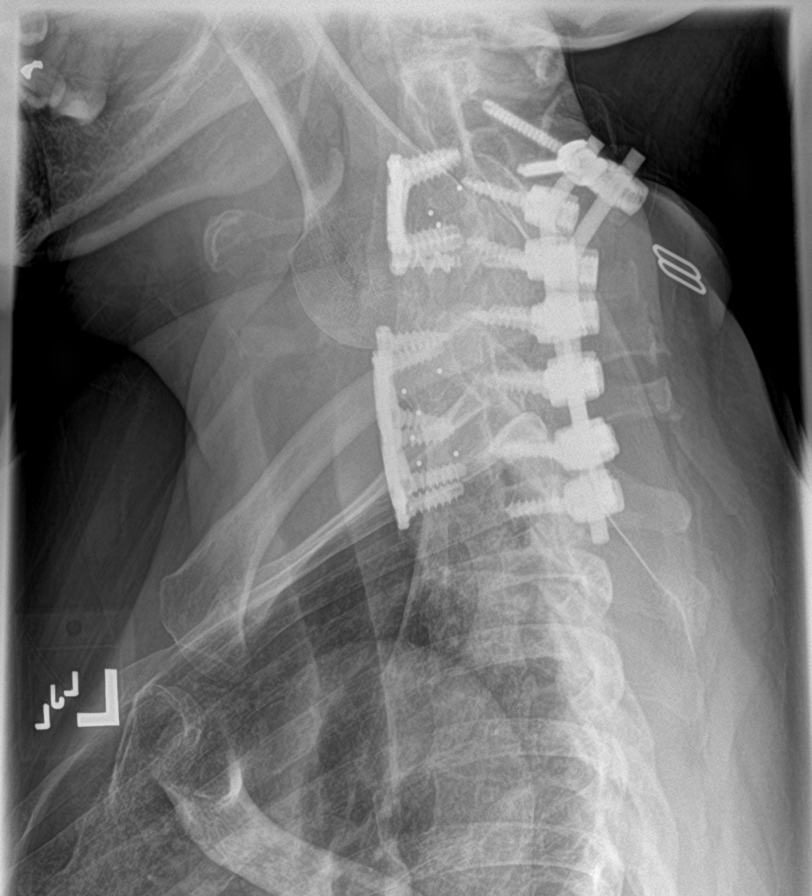

[8 of 9 positions shown; findings below may reference images not displayed]

FINDINGS: Frontal, bilateral oblique, lateral neutral, lateral flexion,
lateral extension views of the cervical spine are obtained. Stable
postsurgical changes are seen from ACDF at C2-3 as well as C5-7.
Posterior fusion spans C2 through C7. Stable alignment with mild
kyphosis centered at C3-4. Limited excursion of the cervical spine
during flexion and extension, with no evidence of instability. No
acute displaced fractures. Neural foramina are suboptimally
visualized due to surgical hardware. Prevertebral soft tissues are
unremarkable. Lung apices are clear.
IMPRESSION: 1. Extensive postsurgical changes of the cervical spine, with no
evidence of orthopedic hardware failure or loosening.
2. No acute bony abnormality.

## 2023-07-16 NOTE — Addendum Note (Signed)
Addended by: Edward Jolly on: 07/16/2023 10:17 AM   Modules accepted: Orders

## 2023-07-25 IMAGING — CT CT RENAL STONE PROTOCOL
2 of 4 series · 16 of 46 positions shown, 18 images · non-contrast
Comparison: 08/04/2018

CLINICAL DATA: Left flank pain and left lower quadrant pain for 3
months. Nephrolithiasis.

EXAM:
CT ABDOMEN AND PELVIS WITHOUT CONTRAST
TECHNIQUE: Multidetector CT imaging of the abdomen and pelvis was performed
following the standard protocol without IV contrast.

[Series 2: renal stone 5.00 · axial · 0.59mm/px · z∈[-1439,-1054]mm · 13 of 85 slices shown, 15 images]
[im 4/85  soft-tissue]
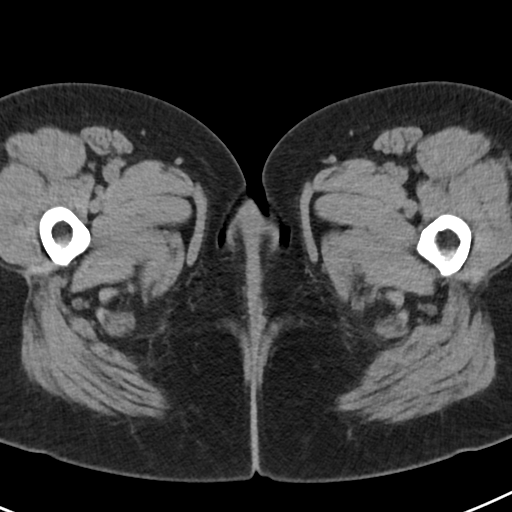
[im 4/85  bone]
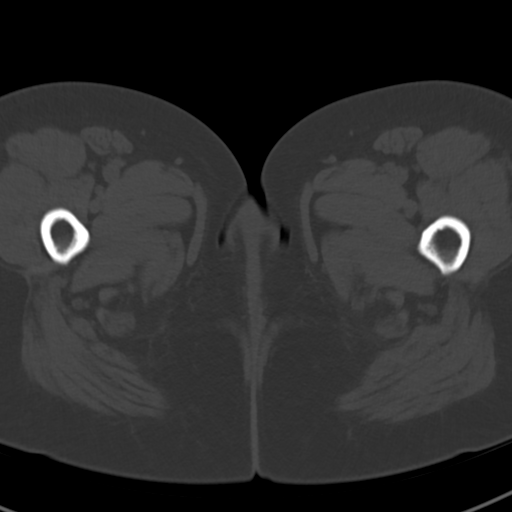
[im 11/85  soft-tissue]
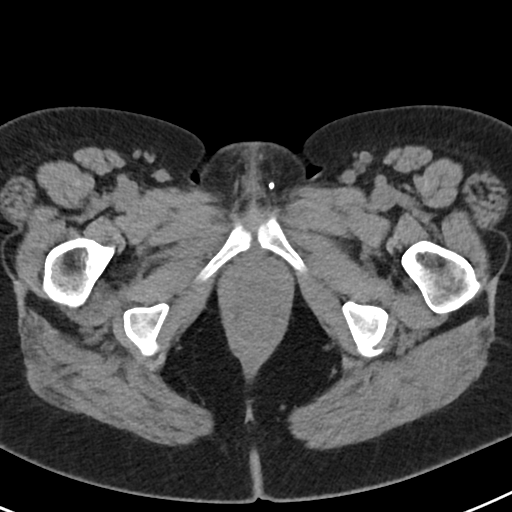
[im 18/85  soft-tissue]
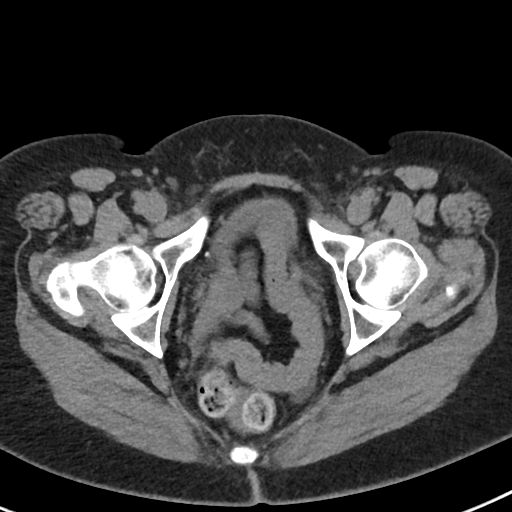
[im 25/85  soft-tissue]
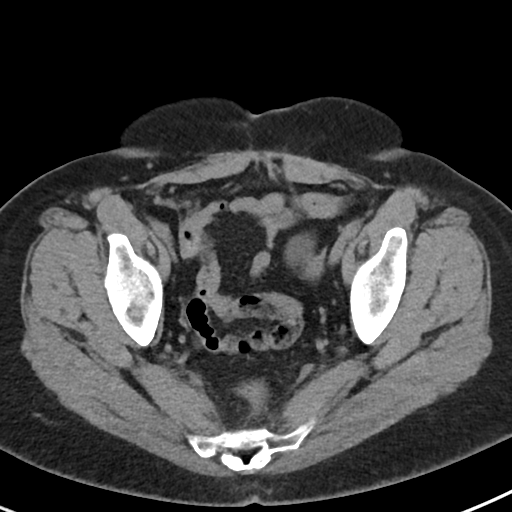
[im 29/85  soft-tissue]
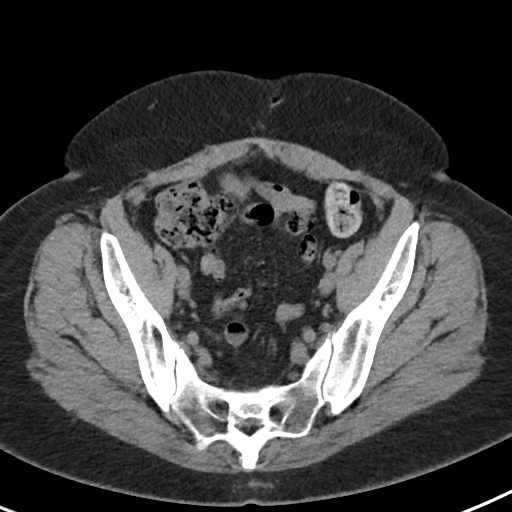
[im 36/85  soft-tissue]
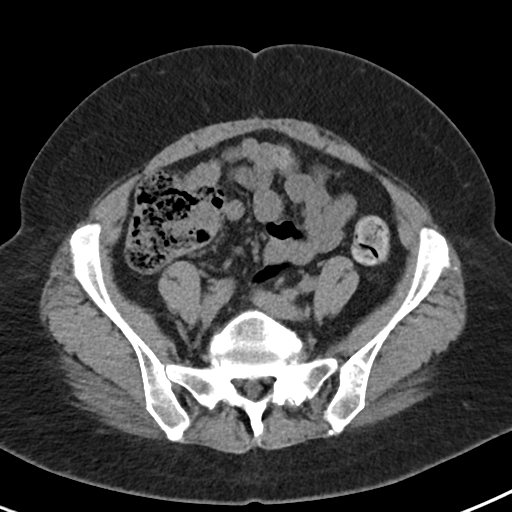
[im 43/85  soft-tissue]
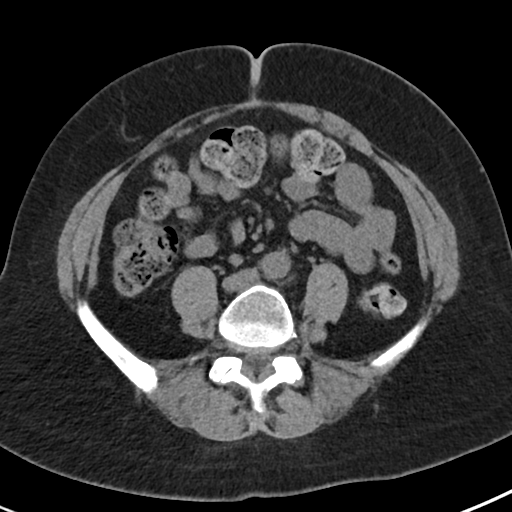
[im 50/85  soft-tissue]
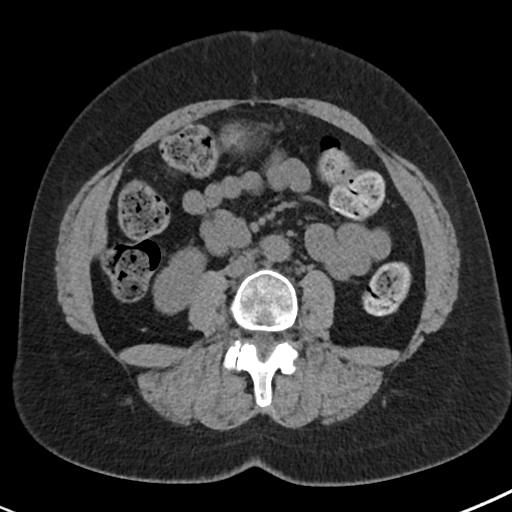
[im 57/85  soft-tissue]
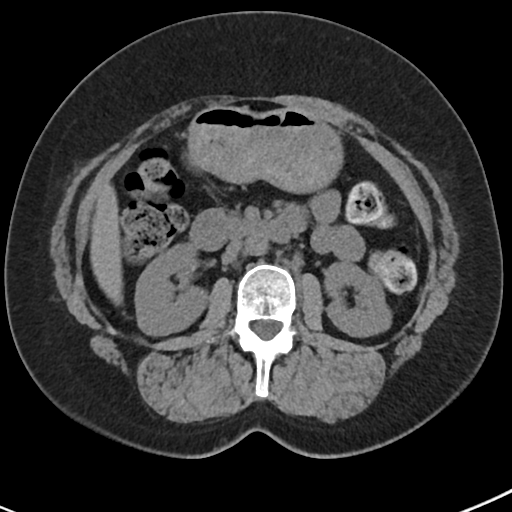
[im 57/85  bone]
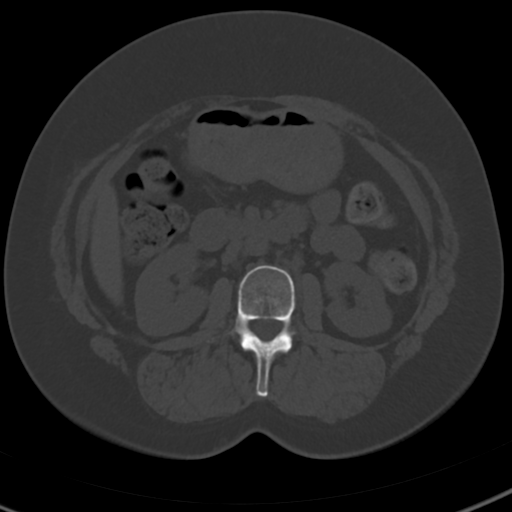
[im 60/85  soft-tissue]
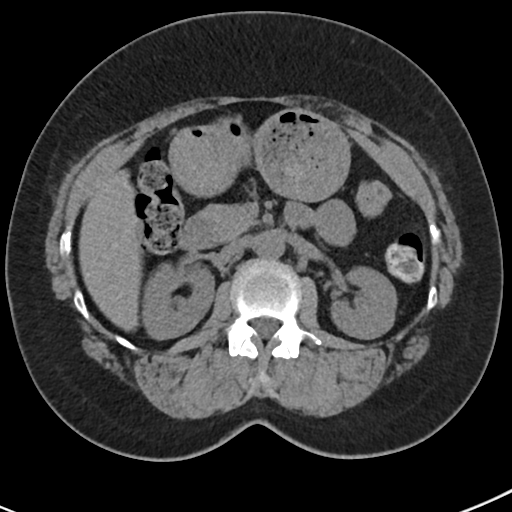
[im 67/85  soft-tissue]
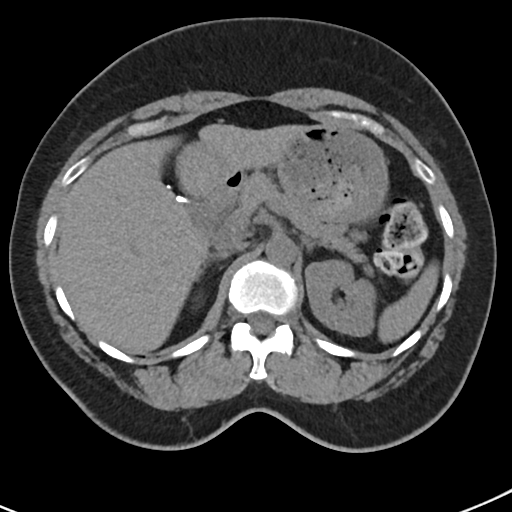
[im 74/85  soft-tissue]
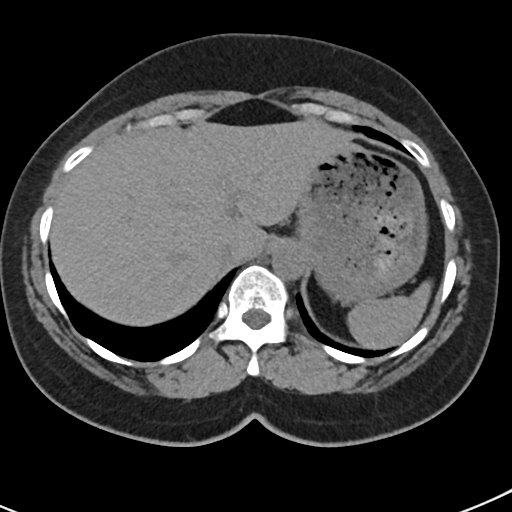
[im 81/85  soft-tissue]
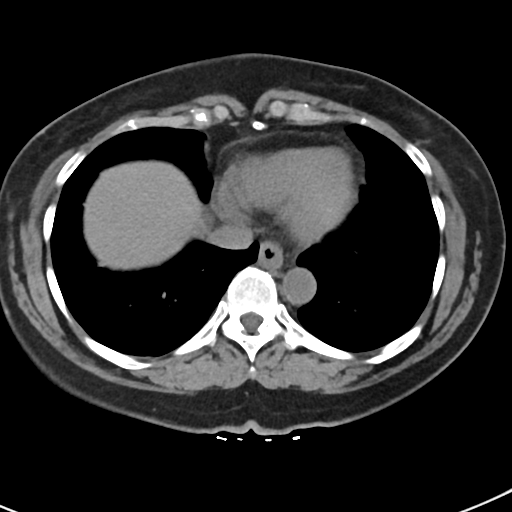

[Series 4: renal stone 2.00 cor · coronal · 0.59mm/px · 3 of 145 slices shown]
[im 49/145  soft-tissue]
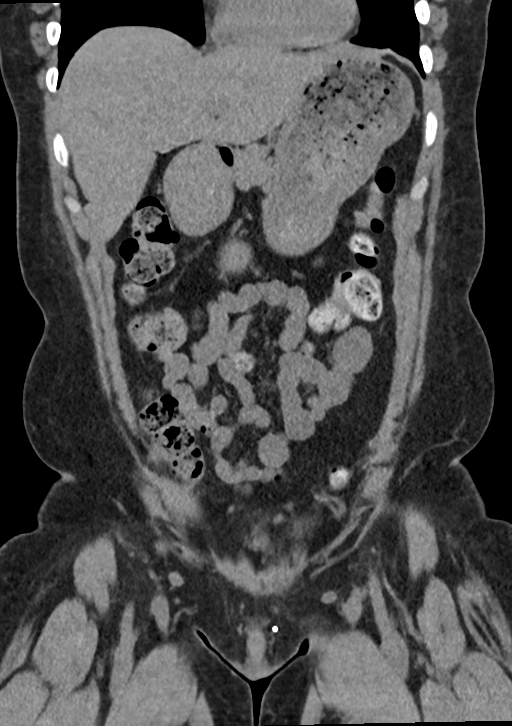
[im 65/145  soft-tissue]
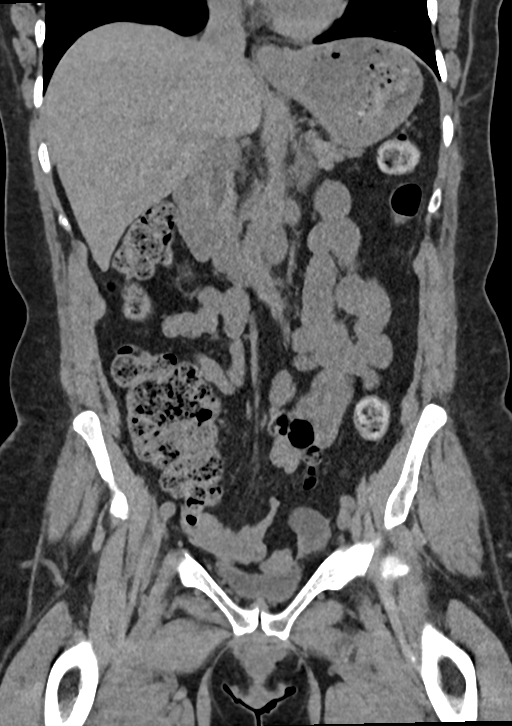
[im 81/145  soft-tissue]
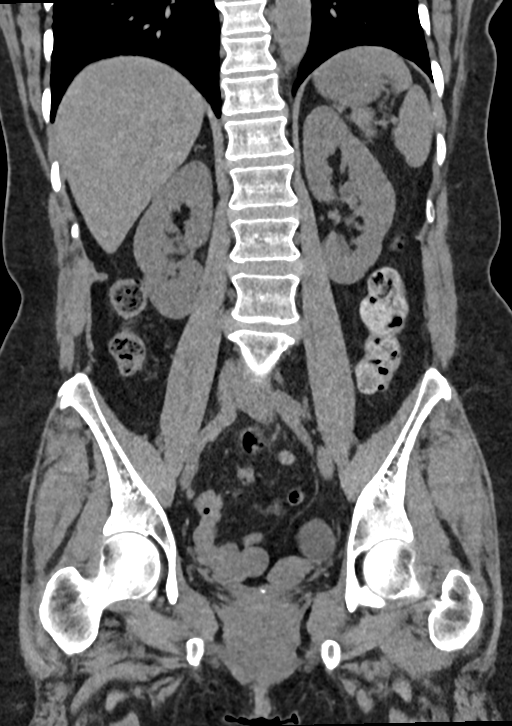

[16 of 46 positions shown; findings below may reference images not displayed]

FINDINGS: Lower chest: No acute findings.

Hepatobiliary: No mass visualized on this unenhanced exam. Prior
cholecystectomy. No evidence of biliary obstruction.

Pancreas: No mass or inflammatory process visualized on this
unenhanced exam.

Spleen:  Within normal limits in size.

Adrenals/Urinary tract: No evidence of urolithiasis or
hydronephrosis. Unremarkable unopacified urinary bladder.

Stomach/Bowel: No evidence of obstruction, inflammatory process, or
abnormal fluid collections.

Vascular/Lymphatic: No pathologically enlarged lymph nodes
identified. No evidence of abdominal aortic aneurysm.

Reproductive: Prior hysterectomy. Cystic lesion in the left adnexa
measures 4.7 x 2.9 cm, without significant change since prior study,
consistent with benign etiology. No evidence of inflammatory process
or free fluid.

Other:  None.

Musculoskeletal:  No suspicious bone lesions identified.
IMPRESSION: No evidence of urolithiasis, hydronephrosis, or other acute
findings.

Stable cystic lesion in left adnexa, consistent with benign
etiology.

## 2023-08-29 ENCOUNTER — Encounter: Payer: Self-pay | Admitting: Student in an Organized Health Care Education/Training Program

## 2023-08-29 ENCOUNTER — Ambulatory Visit
Payer: 59 | Attending: Student in an Organized Health Care Education/Training Program | Admitting: Student in an Organized Health Care Education/Training Program

## 2023-08-29 VITALS — BP 118/92 | HR 101 | Temp 97.7°F | Resp 18 | Ht 61.0 in | Wt 150.0 lb

## 2023-08-29 DIAGNOSIS — M5030 Other cervical disc degeneration, unspecified cervical region: Secondary | ICD-10-CM | POA: Diagnosis present

## 2023-08-29 DIAGNOSIS — M7918 Myalgia, other site: Secondary | ICD-10-CM | POA: Diagnosis present

## 2023-08-29 DIAGNOSIS — M47812 Spondylosis without myelopathy or radiculopathy, cervical region: Secondary | ICD-10-CM | POA: Insufficient documentation

## 2023-08-29 DIAGNOSIS — Q761 Klippel-Feil syndrome: Secondary | ICD-10-CM | POA: Diagnosis present

## 2023-08-29 DIAGNOSIS — M5481 Occipital neuralgia: Secondary | ICD-10-CM | POA: Insufficient documentation

## 2023-08-29 DIAGNOSIS — Z9889 Other specified postprocedural states: Secondary | ICD-10-CM | POA: Insufficient documentation

## 2023-08-29 DIAGNOSIS — G894 Chronic pain syndrome: Secondary | ICD-10-CM | POA: Diagnosis present

## 2023-08-29 MED ORDER — OXYCODONE HCL 5 MG PO TABS
5.0000 mg | ORAL_TABLET | Freq: Four times a day (QID) | ORAL | 0 refills | Status: AC | PRN
Start: 2023-09-06 — End: 2023-10-06

## 2023-08-29 MED ORDER — OXYCODONE HCL 5 MG PO TABS: 5.0000 mg | ORAL_TABLET | Freq: Four times a day (QID) | ORAL | 0 refills | Status: DC | PRN

## 2023-08-29 MED ORDER — OXYCODONE HCL 5 MG PO TABS
5.0000 mg | ORAL_TABLET | Freq: Four times a day (QID) | ORAL | 0 refills | Status: AC | PRN
Start: 2023-10-06 — End: 2023-11-05

## 2023-08-29 MED ORDER — GABAPENTIN 600 MG PO TABS
600.0000 mg | ORAL_TABLET | Freq: Three times a day (TID) | ORAL | 5 refills | Status: DC
Start: 2023-08-29 — End: 2023-11-21

## 2023-08-29 NOTE — Progress Notes (Signed)
Nursing Pain Medication Assessment:  Safety precautions to be maintained throughout the outpatient stay will include: orient to surroundings, keep bed in low position, maintain call bell within reach at all times, provide assistance with transfer out of bed and ambulation.  Medication Inspection Compliance: Pill count conducted under aseptic conditions, in front of the patient. Neither the pills nor the bottle was removed from the patient's sight at any time. Once count was completed pills were immediately returned to the patient in their original bottle.  Medication: Oxycodone IR Pill/Patch Count:  57 of 120 pills remain Pill/Patch Appearance: Markings consistent with prescribed medication Bottle Appearance: Standard pharmacy container. Clearly labeled. Filled Date: 01 / 08 / 2025 Last Medication intake:  TodaySafety precautions to be maintained throughout the outpatient stay will include: orient to surroundings, keep bed in low position, maintain call bell within reach at all times, provide assistance with transfer out of bed and ambulation.   One tablet fell onto the ground.  Patient witnessed, does not wish to keep it. Pill was discarded into sharps container, witnessed by patient.

## 2023-08-29 NOTE — Patient Instructions (Signed)

## 2023-08-29 NOTE — Progress Notes (Signed)
PROVIDER NOTE: Information contained herein reflects review and annotations entered in association with encounter. Interpretation of such information and data should be left to medically-trained personnel. Information provided to patient can be located elsewhere in the medical record under "Patient Instructions". Document created using STT-dictation technology, any transcriptional errors that may result from process are unintentional.    Patient: Kimberly [name]  Service Category: E/M  Provider: Edward Jolly, MD  DOB: Jul 07, 1961  DOS: 08/29/2023  Referring Provider: Marisue Ivan, MD  MRN: [id number]  Specialty: Interventional Pain Management  PCP: Marisue Ivan, MD  Type: Established Patient  Setting: Ambulatory outpatient    Location: Office  Delivery: Face-to-face     HPI  [name], a 63 y.o. year old female, is here today because of her Cervical spondylosis without myelopathy [M47.812]. [name] primary complain today is Headache  Pertinent problems: [name] has SHOULDER PAIN; IMPINGEMENT SYNDROME; RUPTURE ROTATOR CUFF; CTS (carpal tunnel syndrome); Cervical spondylosis without myelopathy; Cervical stenosis of spinal canal; Pseudoarthrosis of cervical spine (HCC); Numbness and tingling; Vitamin D deficiency; Cervical fusion syndrome; Hx of cervical spine surgery; Chronic pain syndrome; Cervicalgia; Controlled substance agreement signed; Depression, major, single episode, complete remission (HCC); Cervical myofascial pain syndrome; and Occipital headache on their pertinent problem list. Pain Assessment: Severity of Chronic pain is reported as a 7 /10. Location: Head Posterior/ . Onset: More than a month ago. Quality: Throbbing. Timing: Constant. Modifying factor(s): to top of the head. Vitals:  height is 5\' 1"  (1.549 m) and weight is 150 lb (68 kg). Her temporal temperature is 97.7 F (36.5 C). Her blood pressure is 118/92 (abnormal) and her pulse is 101 (abnormal). Her  respiration is 18 and oxygen saturation is 96%.  BMI: Estimated body mass index is 28.34 kg/m as calculated from the following:   Height as of this encounter: 5\' 1"  (1.549 m).   Weight as of this encounter: 150 lb (68 kg). Last encounter: 05/21/2023. Last procedure: Visit date not found.  Reason for encounter:  History of Present Illness   The patient presents with episodes of syncope and concerns about a possible concussion.  The patient experienced two episodes of syncope on January 14th, following severe vomiting, which they attribute to food poisoning from undercooked meat consumed at a restaurant. They describe waking up on the cold bathroom floor after each episode. No history of seizures is noted.  Since the syncope episodes, they have been waking up with a bloodshot left eye every morning. They are unsure if this is related to a concussion.  Persistent left ear pain has been present since the syncope episodes, with no clear cause identified.  They report significant hip pain that has not improved with prednisone. The pain is severe enough to limit their ability to walk in a store without needing to sit down frequently. They are currently taking gabapentin three times a day and have requested a refill, along with oxycodone, which has been refilled.       Pharmacotherapy Assessment  Analgesic: Oxycodone 5mg  every 6hours as needed, quantity 120/month; MME equals 30    Monitoring: [place] PMP: PDMP reviewed during this encounter.       Pharmacotherapy: No side-effects or adverse reactions reported. Compliance: No problems identified. Effectiveness: Clinically acceptable.  Kimberly Elk, RN  08/29/2023 11:32 AM  Sign when Signing Visit Nursing Pain Medication Assessment:  Safety precautions to be maintained throughout the outpatient stay will include: orient to surroundings, keep bed in low position, maintain call  bell within reach at all times, provide assistance with transfer out  of bed and ambulation.  Medication Inspection Compliance: Pill count conducted under aseptic conditions, in front of the patient. Neither the pills nor the bottle was removed from the patient's sight at any time. Once count was completed pills were immediately returned to the patient in their original bottle.  Medication: Oxycodone IR Pill/Patch Count:  57 of 120 pills remain Pill/Patch Appearance: Markings consistent with prescribed medication Bottle Appearance: Standard pharmacy container. Clearly labeled. Filled Date: 01 / 08 / 2025 Last Medication intake:  TodaySafety precautions to be maintained throughout the outpatient stay will include: orient to surroundings, keep bed in low position, maintain call bell within reach at all times, provide assistance with transfer out of bed and ambulation.   One tablet fell onto the ground.  Patient witnessed, does not wish to keep it. Pill was discarded into sharps container, witnessed by patient.  No results found for: "CBDTHCR" No results found for: "D8THCCBX" No results found for: "D9THCCBX"  UDS:  Summary  Date Value Ref Range Status  03/05/2023 Note  Final    Comment:    ==================================================================== [employer] Select 13 (MW) ==================================================================== Specimen Alert Not Detected result may be consistent with the time of last use noted for this medication. AS NEEDED (Diazepam) ==================================================================== Test                             Result       Flag       Units  Drug Present and Declared for Prescription Verification   Oxycodone                      1440         EXPECTED   ng/mg creat   Oxymorphone                    2005         EXPECTED   ng/mg creat   Noroxycodone                   >3135        EXPECTED   ng/mg creat   Noroxymorphone                 780          EXPECTED   ng/mg creat    Sources of oxycodone  are scheduled prescription medications.    Oxymorphone, noroxycodone, and noroxymorphone are expected    metabolites of oxycodone. Oxymorphone is also available as a    scheduled prescription medication.  Drug Absent but Declared for Prescription Verification   Diazepam                       Not Detected UNEXPECTED ng/mg creat ==================================================================== Test                      Result    Flag   Units      Ref Range   Creatinine              319              mg/dL      >=29 ==================================================================== Declared Medications:  The flagging and interpretation on this report are based on the  following declared medications.  Unexpected results may arise from  inaccuracies in the  declared medications.   **Note: The testing scope of this panel includes these medications:   Diazepam  Oxycodone (Roxicodone)   **Note: The testing scope of this panel does not include the  following reported medications:   Acetaminophen (Tylenol)  Amlodipine (Norvasc)  Cyclobenzaprine (Flexeril)  Estradiol (Estrace)  Estrogens (Premarin)  Eye Drop  Fluconazole (Diflucan)  Gabapentin (Neurontin)  Hydroxyzine (Atarax)  Hyoscyamine (Uro-MP)  Ibuprofen (Advil)  Liothyronine (Cytomel)  Meloxicam (Mobic)  Methenamine (Uro-MP)  Methylene blue (Uro-MP)  Pantoprazole (Protonix)  Phenyl salicylate (Uro-MP)  Rizatriptan (Maxalt)  Sodium phosphate, monobasic (Uro-MP)  Timolol (Timoptic)  Valacyclovir (Valtrex)  Vitamin D3 ==================================================================== For clinical consultation, please call [phone]. ====================================================================       ROS  Constitutional: Denies any fever or chills Gastrointestinal: No reported hemesis, hematochezia, vomiting, or acute GI distress Musculoskeletal: Denies any acute onset joint swelling, redness, loss of  ROM, or weakness Neurological: No reported episodes of acute onset apraxia, aphasia, dysarthria, agnosia, amnesia, paralysis, loss of coordination, or loss of consciousness  Medication Review  NONFORMULARY OR COMPOUNDED ITEM, Naphazoline-Pheniramine, Uro-MP, acetaminophen, amLODipine, cholecalciferol, conjugated estrogens, cyclobenzaprine, estradiol, fluconazole, gabapentin, hydrOXYzine, ibuprofen, liothyronine, meloxicam, oxyCODONE, pantoprazole, phenazopyridine, rizatriptan, timolol, and valACYclovir  History Review  Allergy: Ms. Swarey is allergic to shellfish allergy, peanut-containing drug products, codeine, diphtheria toxoid, and tetanus toxoids. Drug: Ms. Madewell  reports no history of drug use. Alcohol:  reports current alcohol use. Tobacco:  reports that she has never smoked. She has never used smokeless tobacco. Social: Ms. Lindsley  reports that she has never smoked. She has never used smokeless tobacco. She reports current alcohol use. She reports that she does not use drugs. Medical:  has a past medical history of Anxiety, Arthritis, Asthma, Balance problem, Brain cyst, Carpal tunnel syndrome, bilateral, Cervical fusion syndrome, Complete rupture of rotator cuff, Complication of anesthesia, Degenerative disc disease, cervical, Depression, Difficult intubation, Flu (08/10/2018), GERD (gastroesophageal reflux disease), Headache, Herpes, History of kidney stones, History of pneumonia, Hyperlipidemia, Hypertension, Hypothyroidism, Inflammation of shoulder joint, Memory difficulties, Neuromuscular disorder (HCC), Occipital neuralgia, Pneumothorax on right, Pseudoarthrosis of cervical spine (HCC), Recurrent falls, Syncope, and Urinary frequency. Surgical: Ms. Mckiernan  has a past surgical history that includes Abdominal hysterectomy; Rotator cuff repair (Left, 2014); Foreign Body Removal (Left); Carpal tunnel release (Left, 02/16/2013); Carpal tunnel release (Right, 03/27/2013); Cesarean section;  Anterior cervical decomp/discectomy fusion (N/A, 08/06/2014); Colonoscopy; Nasal sinus surgery (N/A, 04/13/2015); Shoulder arthroscopy with distal clavicle resection (Left, 05/18/2015); Shoulder acromioplasty (Left, 05/18/2015); Cholecystectomy (N/A, 06/17/2015); Neck surgery (2016); Anterior cervical decomp/discectomy fusion (N/A, 07/04/2016); Posterior cervical fusion/foraminotomy (N/A, 11/13/2016); Knee arthroscopy; Extracorporeal shock wave lithotripsy (Left, 09/04/2018); Colonoscopy with propofol (N/A, 05/06/2019); Reduction mammaplasty (10/2019); Esophagogastroduodenoscopy (N/A, 05/08/2021); cysto with hydrodistension (N/A, 09/26/2021); Cystoscopy w/ retrogrades (Bilateral, 09/26/2021); Cystoscopy with biopsy (N/A, 09/26/2021); Ureteroscopy (Bilateral, 09/26/2021); Esophagogastroduodenoscopy (11/25/2017); Xi robotic assisted oophorectomy (N/A, 12/22/2021); and Excision vaginal cyst (N/A, 12/22/2021). Family: family history includes Cancer in her father; Hypertension in her father and another family member.  Laboratory Chemistry Profile   Renal Lab Results  Component Value Date   BUN 11 08/26/2021   CREATININE 0.80 11/16/2021   BCR 10 07/01/2019   GFRAA 86 07/01/2019   GFRNONAA >60 08/26/2021    Hepatic Lab Results  Component Value Date   AST 20 08/10/2021   ALT 12 08/10/2021   ALBUMIN 3.3 (L) 08/10/2021   ALKPHOS 58 08/10/2021   LIPASE 34 08/10/2021    Electrolytes Lab Results  Component Value Date   NA 136 08/26/2021   K  3.5 08/26/2021   CL 99 08/26/2021   CALCIUM 9.6 08/26/2021    Bone No results found for: "VD25OH", "VD125OH2TOT", "MW4132GM0", "NU2725DG6", "25OHVITD1", "25OHVITD2", "25OHVITD3", "TESTOFREE", "TESTOSTERONE"  Inflammation (CRP: Acute Phase) (ESR: Chronic Phase) Lab Results  Component Value Date   ESRSEDRATE 35 (H) 08/12/2021         Note: Above Lab results reviewed.  Recent Imaging Review  MR PELVIS W WO CONTRAST CLINICAL DATA:  Pelvic pain for 4  months.  EXAM: MRI PELVIS WITHOUT AND WITH CONTRAST  TECHNIQUE: Multiplanar multisequence MR imaging of the pelvis was performed both before and after administration of intravenous contrast.  CONTRAST:  7 mL Vueway  COMPARISON:  None Available.  FINDINGS: Lower Urinary Tract: No urinary bladder or urethral abnormality identified.  Bowel: Unremarkable pelvic bowel loops.  Vascular/Lymphatic: Unremarkable. No pathologically enlarged pelvic lymph nodes identified.  Reproductive:  -- Uterus: Prior hysterectomy. Vaginal cuff is normal in appearance.  -- Right ovary: Appears normal. No ovarian or adnexal masses identified.  -- Left ovary:  Not visualized, however no adnexal mass identified.  Other: No peritoneal thickening or abnormal free fluid.  Musculoskeletal:  Unremarkable.  IMPRESSION: No pelvic mass or other significant abnormality identified.  Prior hysterectomy.  Normal appearance of vaginal cuff.  Normal appearance of right ovary.  Left ovary not visualized, possibly due to prior oophorectomy, however no adnexal mass identified.  Electronically Signed   By: Danae Orleans M.D.   On: 06/28/2023 08:58 Note: Reviewed        Physical Exam  General appearance: Well nourished, well developed, and well hydrated. In no apparent acute distress Mental status: Alert, oriented x 3 (person, place, & time)       Respiratory: No evidence of acute respiratory distress Eyes: PERLA Vitals: BP (!) 118/92   Pulse (!) 101   Temp 97.7 F (36.5 C) (Temporal)   Resp 18   Ht 5\' 1"  (1.549 m)   Wt 150 lb (68 kg)   SpO2 96%   BMI 28.34 kg/m  BMI: Estimated body mass index is 28.34 kg/m as calculated from the following:   Height as of this encounter: 5\' 1"  (1.549 m).   Weight as of this encounter: 150 lb (68 kg). Ideal: Ideal body weight: 47.8 kg (105 lb 6.1 oz) Adjusted ideal body weight: 55.9 kg (123 lb 3.7 oz)  Assessment   Diagnosis  1. Cervical spondylosis without  myelopathy   2. Bilateral occipital neuralgia   3. Cervical fusion syndrome (C2-T1)   4. Cervical myofascial pain syndrome   5. Hx of cervical spine surgery   6. Chronic pain syndrome   7. Degenerative disc disease, cervical      Updated Problems: No problems updated.  Plan of Care  Problem-specific:  Assessment and Plan    Syncope Experienced two episodes of syncope on August 13, 2023, following suspected food poisoning with associated vomiting. There is concern about a possible concussion. Differential diagnosis includes food poisoning-induced syncope versus other causes such as seizure, though there is no history of seizures. Follow up with the primary care provider at 12:30 PM for further evaluation. Consider a head CT to rule out concussion.  Left Ear Pain Reports persistent left ear pain since the syncope episodes. Severity and cause are unspecified. Evaluate left ear pain during the follow-up appointment with the primary care provider.  Hip Pain Reports severe hip pain unresponsive to prednisone, significantly limiting mobility. Evaluate hip pain during the follow-up appointment with the primary care provider. Consider  alternative pain management options if prednisone is ineffective.  Medication Refill Requested refills for gabapentin (taken three times daily) and oxycodone. Send in gabapentin and oxycodone refills.       [name] has a current medication list which includes the following long-term medication(s): gabapentin.  Pharmacotherapy (Medications Ordered): Meds ordered this encounter  Medications   oxyCODONE (OXY IR/ROXICODONE) 5 MG immediate release tablet    Sig: Take 1 tablet (5 mg total) by mouth every 6 (six) hours as needed for severe pain (pain score 7-10). Must last 30 days.    Dispense:  120 tablet    Refill:  0    Chronic Pain: STOP Act (Not applicable) Fill 1 day early if closed on refill date. Avoid benzodiazepines within 8 hours of  opioids   oxyCODONE (OXY IR/ROXICODONE) 5 MG immediate release tablet    Sig: Take 1 tablet (5 mg total) by mouth every 6 (six) hours as needed for severe pain (pain score 7-10). Must last 30 days.    Dispense:  120 tablet    Refill:  0    Chronic Pain: STOP Act (Not applicable) Fill 1 day early if closed on refill date. Avoid benzodiazepines within 8 hours of opioids   oxyCODONE (OXY IR/ROXICODONE) 5 MG immediate release tablet    Sig: Take 1 tablet (5 mg total) by mouth every 6 (six) hours as needed for severe pain (pain score 7-10). Must last 30 days.    Dispense:  120 tablet    Refill:  0    Chronic Pain: STOP Act (Not applicable) Fill 1 day early if closed on refill date. Avoid benzodiazepines within 8 hours of opioids   gabapentin (NEURONTIN) 600 MG tablet    Sig: Take 1 tablet (600 mg total) by mouth 3 (three) times daily.    Dispense:  90 tablet    Refill:  5   Orders:  No orders of the defined types were placed in this encounter.  Follow-up plan:   Return in about 3 months (around 11/27/2023) for MM, F2F.      cervical TPI, cervical facet medial branch nerve block, suprascapular nerve block, bilateral C4, C5, C6   cervical facet medial branch nerve block 07/04/2020: Not effective, do not repeat.          Recent Visits No visits were found meeting these conditions. Showing recent visits within past 90 days and meeting all other requirements Today's Visits Date Type Provider Dept  08/29/23 Office Visit Edward Jolly, MD [facility]  Showing today's visits and meeting all other requirements Future Appointments Date Type Provider Dept  11/21/23 Appointment Edward Jolly, MD [facility]  Showing future appointments within next 90 days and meeting all other requirements  I discussed the assessment and treatment plan with the patient. The patient was provided an opportunity to ask questions and all were answered. The patient agreed with the plan and  demonstrated an understanding of the instructions.  Patient advised to call back or seek an in-person evaluation if the symptoms or condition worsens.  Duration of encounter: .  Total time on encounter, as per AMA guidelines included both the face-to-face and non-face-to-face time personally spent by the physician and/or other qualified health care professional(s) on the day of the encounter (includes time in activities that require the physician or other qualified health care professional and does not include time in activities normally performed by clinical staff). Physician's time may include the following activities when performed: Preparing  to see the patient (e.g., pre-charting review of records, searching for previously ordered imaging, lab work, and nerve conduction tests) Review of prior analgesic pharmacotherapies. Reviewing PMP Interpreting ordered tests (e.g., lab work, imaging, nerve conduction tests) Performing post-procedure evaluations, including interpretation of diagnostic procedures Obtaining and/or reviewing separately obtained history Performing a medically appropriate examination and/or evaluation Counseling and educating the patient/family/caregiver Ordering medications, tests, or procedures Referring and communicating with other health care professionals (when not separately reported) Documenting clinical information in the electronic or other health record Independently interpreting results (not separately reported) and communicating results to the patient/ family/caregiver Care coordination (not separately reported)  Note by: Edward Jolly, MD Date: 08/29/2023; Time: 1:42 PM

## 2023-09-02 ENCOUNTER — Other Ambulatory Visit: Payer: Self-pay

## 2023-09-04 ENCOUNTER — Ambulatory Visit: Payer: 59 | Admitting: Gastroenterology

## 2023-09-17 ENCOUNTER — Other Ambulatory Visit: Payer: Self-pay | Admitting: Urology

## 2023-09-17 DIAGNOSIS — R102 Pelvic and perineal pain: Secondary | ICD-10-CM

## 2023-09-25 NOTE — Progress Notes (Unsigned)
 09/26/2023 8:13 AM   Kimberly [name] 11-Jun-1961 [id number]  Referring provider: Marisue Ivan, MD [phone] [facility] [address] [facility] [place],  [place] [address]  Urological history: 1.  High risk hematuria -Non-smoker -CTU (08/25/2021) - Punctuate nonobstructive calculus of the superior pole of the left kidney -cysto/hydro distention w/ bilateral RTG's (08/2021) - Post dilation glomerulations were noted lower lateral and posterior walls/trigone -contrast CT (03/2022) - Mild urothelial enhancement of right ureter - urine culture positive for E.coli -RUS (10/2022) - normal  -cysto (11/2022) - NED   2. Interstitial cystitis -cysto/hydrodistention in February 2023 which noted post dilation glomerulations -Uribel, amitriptyline 25 mg and hydroxyzine 10 mg TID, prn   3.  Nephrolithiasis -CTU (07/2021) - Punctuate nonobstructive calculus of the superior pole of the left kidney  No chief complaint on file.  HPI: Kimberly [name] is a 63 y.o. female who presents today for 3 months follow up.   Previous records reviewed.   She has seen OB/GYN since her last visit with [place].  She had an MRI of the pelvis on 06/13/2023 which was negative.  She has been referred onto pelvic floor PT.     PMH: Past Medical History:  Diagnosis Date   Anxiety    takes alprazolam - rare use    Arthritis    cervical spondylosis    Asthma    Balance problem    Brain cyst    Carpal tunnel syndrome, bilateral    Cervical fusion syndrome    Complete rupture of rotator cuff    Complication of anesthesia    lung collapsed after neck fusion   Degenerative disc disease, cervical    Depression    Difficult intubation    Small mouth opening, limited neck flexion, very anterior    Flu 08/10/2018   tested positive   GERD (gastroesophageal reflux disease)    pt. reports that its better, no meds in use at this time- 2015   Headache    Herpes    History of kidney stones    History of  pneumonia    Hyperlipidemia    Hypertension    pt. doesn't see cardiologist, followed for HTN by Dr. Sherwood Gambler   Hypothyroidism    Inflammation of shoulder joint    Memory difficulties    Neuromuscular disorder (HCC)    joint and muscle problems   Occipital neuralgia    Pneumothorax on right    following C4-6 ACDF 07/04/16   Pseudoarthrosis of cervical spine (HCC)    Recurrent falls    Syncope    Urinary frequency     Surgical History: Past Surgical History:  Procedure Laterality Date   ABDOMINAL HYSTERECTOMY     ANTERIOR CERVICAL DECOMP/DISCECTOMY FUSION N/A 08/06/2014   Procedure: ANTERIOR CERVICAL DECOMPRESSION/DISCECTOMY FUSION CERVICAL THREE-FOUR,CERVICAL SIX-SEVEN ,CERVICAL SEVEN-THORACIC ONE;  Surgeon: Karn Cassis, MD;  Location: [facility];  Service: Neurosurgery;  Laterality: N/A;   ANTERIOR CERVICAL DECOMP/DISCECTOMY FUSION N/A 07/04/2016   Procedure: CERVICAL FOUR-FIVE, CERVICAL FIVE-SIX ANTERIOR CERVICAL DECOMPRESSION/DISCECTOMY/FUSION WITH REVISION OF CERVICAL THREE-FOUR PLATE;  Surgeon: Hilda Lias, MD;  Location: [facility] OR;  Service: Neurosurgery;  Laterality: N/A;   CARPAL TUNNEL RELEASE Left 02/16/2013   Procedure: CARPAL TUNNEL RELEASE;  Surgeon: Vickki Hearing, MD;  Location: [facility];  Service: Orthopedics;  Laterality: Left;   CARPAL TUNNEL RELEASE Right 03/27/2013   Procedure: RIGHT CARPAL TUNNEL RELEASE;  Surgeon: Vickki Hearing, MD;  Location: [facility];  Service: Orthopedics;  Laterality: Right;  CESAREAN SECTION     x2   CHOLECYSTECTOMY N/A 06/17/2015   Procedure: LAPAROSCOPIC CHOLECYSTECTOMY;  Surgeon: Franky Macho, MD;  Location: [facility];  Service: General;  Laterality: N/A;   COLONOSCOPY     COLONOSCOPY WITH PROPOFOL N/A 05/06/2019   Procedure: COLONOSCOPY WITH PROPOFOL;  Surgeon: Earline Mayotte, MD;  Location: [facility];  Service: Endoscopy;  Laterality: N/A;   CYSTO WITH HYDRODISTENSION N/A 09/26/2021   Procedure: CYSTOSCOPY/HYDRODISTENSION;   Surgeon: Riki Altes, MD;  Location: [facility];  Service: Urology;  Laterality: N/A;   CYSTOSCOPY W/ RETROGRADES Bilateral 09/26/2021   Procedure: CYSTOSCOPY WITH RETROGRADE PYELOGRAM;  Surgeon: Riki Altes, MD;  Location: [facility];  Service: Urology;  Laterality: Bilateral;   CYSTOSCOPY WITH BIOPSY N/A 09/26/2021   Procedure: CYSTOSCOPY WITH BIOPSY;  Surgeon: Riki Altes, MD;  Location: [facility];  Service: Urology;  Laterality: N/A;   ESOPHAGOGASTRODUODENOSCOPY N/A 05/08/2021   Procedure: ESOPHAGOGASTRODUODENOSCOPY (EGD);  Surgeon: Regis Bill, MD;  Location: [facility];  Service: Endoscopy;  Laterality: N/A;   ESOPHAGOGASTRODUODENOSCOPY  11/25/2017   EXCISION VAGINAL CYST N/A 12/22/2021   Procedure: EXCISION VULVAR CYST;  Surgeon: Christeen Douglas, MD;  Location: [facility];  Service: Gynecology;  Laterality: N/A;   EXTRACORPOREAL SHOCK WAVE LITHOTRIPSY Left 09/04/2018   Procedure: EXTRACORPOREAL SHOCK WAVE LITHOTRIPSY (ESWL);  Surgeon: Sondra Come, MD;  Location: [facility];  Service: Urology;  Laterality: Left;   FOREIGN BODY REMOVAL Left    knee-as child   KNEE ARTHROSCOPY     NASAL SINUS SURGERY N/A 04/13/2015   Procedure: nasal endoscopy with adenoid biopsy;  Surgeon: Melvenia Beam, MD;  Location: [facility] OR;  Service: ENT;  Laterality: N/A;   NECK SURGERY  2016   x 3 all together   POSTERIOR CERVICAL FUSION/FORAMINOTOMY N/A 11/13/2016   Procedure: CERVICAL TWO-CERVICAL SIX POSTERIOR CERVICAL FUSION WITH LATERAL MASS FIXATION;  Surgeon: Barnett Abu, MD;  Location: [facility] OR;  Service: Neurosurgery;  Laterality: N/A;  posterior approach   REDUCTION MAMMAPLASTY  10/2019   ROTATOR CUFF REPAIR Left 2014   SHOULDER ACROMIOPLASTY Left 05/18/2015   Procedure: SHOULDER ACROMIOPLASTY;  Surgeon: Frederico Hamman, MD;  Location: [facility];  Service: Orthopedics;  Laterality: Left;   SHOULDER ARTHROSCOPY WITH DISTAL CLAVICLE RESECTION Left 05/18/2015   Procedure:  LEFT SHOULDER ARTHROSCOPY WITH  DISTAL CLAVICLE RESECTION;  Surgeon: Frederico Hamman, MD;  Location: [facility];  Service: Orthopedics;  Laterality: Left;   URETEROSCOPY Bilateral 09/26/2021   Procedure: URETEROSCOPY;  Surgeon: Riki Altes, MD;  Location: [facility];  Service: Urology;  Laterality: Bilateral;   XI ROBOTIC ASSISTED OOPHORECTOMY N/A 12/22/2021   Procedure: XI ROBOTIC ASSISTED DIAGNOSTIC LAPAROSCOPY, LEFT OOPHORECTOMY, WITH RIGHT SALPINGECTOMY AND LYSIS OF ADHESIONS;  Surgeon: Christeen Douglas, MD;  Location: [facility];  Service: Gynecology;  Laterality: N/A;    Home Medications:  Allergies as of 09/26/2023       Reactions   Shellfish Allergy Anaphylaxis, Hives   "Swells throat up"   Peanut-containing Drug Products Hives   Codeine Other (See Comments)   jitters   Diphtheria Toxoid Rash   Tetanus Toxoids Rash        Medication List        Accurate as of September 25, 2023  8:13 AM. If you have any questions, ask your nurse or doctor.          acetaminophen 500 MG tablet Commonly known as: TYLENOL Take 500 mg by mouth 2 (two) times daily  as needed for headache.   amLODipine 5 MG tablet Commonly known as: NORVASC Take 5 mg by mouth daily.   cholecalciferol 25 MCG (1000 UNIT) tablet Commonly known as: VITAMIN D3 Take 1,000 Units by mouth daily.   cyclobenzaprine 10 MG tablet Commonly known as: FLEXERIL Take 10 mg by mouth 3 (three) times daily as needed for muscle spasms.   escitalopram 5 MG tablet Commonly known as: LEXAPRO Take 5 mg by mouth daily.   estradiol 1 MG tablet Commonly known as: ESTRACE Take 1.5 mg by mouth daily.   fluconazole 100 MG tablet Commonly known as: DIFLUCAN Take 100 mg by mouth daily.   gabapentin 600 MG tablet Commonly known as: Neurontin Take 1 tablet (600 mg total) by mouth 3 (three) times daily.   hydrOXYzine 10 MG tablet Commonly known as: ATARAX Take 1 tablet (10 mg total) by mouth 3 (three) times  daily as needed.   ibuprofen 600 MG tablet Commonly known as: ADVIL Take 600 mg by mouth 3 (three) times daily.   latanoprost 0.005 % ophthalmic solution Commonly known as: XALATAN Place 1 drop into both eyes at bedtime.   liothyronine 5 MCG tablet Commonly known as: CYTOMEL Take 5 mcg by mouth daily.   Lumigan 0.01 % Soln Generic drug: bimatoprost Place 1 drop into both eyes at bedtime.   meloxicam 15 MG tablet Commonly known as: MOBIC Take 15 mg by mouth daily as needed for pain.   methocarbamol 500 MG tablet Commonly known as: ROBAXIN Take 500 mg by mouth every 8 (eight) hours as needed for muscle spasms.   naloxone 4 MG/0.1ML Liqd nasal spray kit Commonly known as: NARCAN Place 1 spray into the nose once.   NONFORMULARY OR COMPOUNDED ITEM Place 10 mg vaginally at bedtime as needed (spasms). Diazepam 10 mg   ondansetron 4 MG disintegrating tablet Commonly known as: ZOFRAN-ODT Take 4 mg by mouth every 8 (eight) hours as needed for refractory nausea / vomiting.   oxyCODONE 5 MG immediate release tablet Commonly known as: Oxy IR/ROXICODONE Take 1 tablet (5 mg total) by mouth every 6 (six) hours as needed for severe pain (pain score 7-10). Must last 30 days.   oxyCODONE 5 MG immediate release tablet Commonly known as: Oxy IR/ROXICODONE Take 1 tablet (5 mg total) by mouth every 6 (six) hours as needed for severe pain (pain score 7-10). Must last 30 days. Start taking on: October 06, 2023   oxyCODONE 5 MG immediate release tablet Commonly known as: Oxy IR/ROXICODONE Take 1 tablet (5 mg total) by mouth every 6 (six) hours as needed for severe pain (pain score 7-10). Must last 30 days. Start taking on: November 05, 2023   pantoprazole 40 MG tablet Commonly known as: PROTONIX Take 40 mg by mouth every morning.   phenazopyridine 200 MG tablet Commonly known as: Pyridium Take 1 tablet (200 mg total) by mouth 3 (three) times daily as needed for pain.   Premarin vaginal  cream Generic drug: conjugated estrogens APPLY A PEA SIZED AMOUNT JUST INSIDE THE VAGINAL INTROITUS WITH A FINGER TIP ON MONDAY , WEDNESDAY AND FRIDAY NIGHTS   rizatriptan 10 MG tablet Commonly known as: MAXALT Take 10 mg by mouth as needed for migraine. May repeat in 2 hours if needed   timolol 0.25 % ophthalmic solution Commonly known as: TIMOPTIC Place 1 drop into the right eye daily.   Uro-MP 118 MG Caps Take one every 6 hours prn for dysuria   valACYclovir 1000 MG tablet  Commonly known as: VALTREX Take 1,000 mg by mouth daily.   VISINE OP Apply 1 drop to eye daily as needed (irritation).        Allergies:  Allergies  Allergen Reactions   Shellfish Allergy Anaphylaxis and Hives    "Swells throat up"   Peanut-Containing Drug Products Hives   Codeine Other (See Comments)    jitters   Diphtheria Toxoid Rash   Tetanus Toxoids Rash    Family History: Family History  Problem Relation Age of Onset   Cancer Father        prostate cancer   Hypertension Father    Hypertension Other    Breast cancer Neg Hx     Social History:  reports that she has never smoked. She has never used smokeless tobacco. She reports current alcohol use. She reports that she does not use drugs.  ROS: Pertinent ROS in HPI  Physical Exam: There were no vitals taken for this visit.  Constitutional:  Well nourished. Alert and oriented, No acute distress. HEENT: [place] AT, moist mucus membranes.  Trachea midline, no masses. Cardiovascular: No clubbing, cyanosis, or edema. Respiratory: Normal respiratory effort, no increased work of breathing. GU: No CVA tenderness.  No bladder fullness or masses.  Recession of labia minora, dry, pale vulvar vaginal mucosa and loss of mucosal ridges and folds.  Normal urethral meatus, no lesions, no prolapse, no discharge.   No urethral masses, tenderness and/or tenderness. No bladder fullness, tenderness or masses. *** vagina mucosa, *** estrogen effect, no  discharge, no lesions, *** pelvic support, *** cystocele and *** rectocele noted.  No cervical motion tenderness.  Uterus is freely mobile and non-fixed.  No adnexal/parametria masses or tenderness noted.  Anus and perineum are without rashes or lesions.   ***  Neurologic: Grossly intact, no focal deficits, moving all 4 extremities. Psychiatric: Normal mood and affect.    Laboratory Data: N/A   Pertinent Imaging: N/A   Assessment & Plan:    1. High risk hematuria -non smoker -recent work up (2024) negative  2. Vaginal atrophy/urethral caruncle -continue vaginal estrogen cream on Monday, Wednesday and Friday  3. RBC casts -Followed by nephrology -has appointment in May 2025   4.  Pelvic pain -Pelvic MRI results were normal  -referred to pelvic floor PT   No follow-ups on file.  These notes generated with voice recognition software. I apologize for typographical errors.  Cloretta Ned  [facility] [address]  [address] [place], [place] [address] [phone]

## 2023-09-26 ENCOUNTER — Ambulatory Visit: Payer: 59 | Admitting: Urology

## 2023-09-26 ENCOUNTER — Encounter: Payer: Self-pay | Admitting: Urology

## 2023-09-26 VITALS — BP 116/78 | HR 101 | Ht 61.0 in | Wt 150.0 lb

## 2023-09-26 DIAGNOSIS — N362 Urethral caruncle: Secondary | ICD-10-CM | POA: Diagnosis not present

## 2023-09-26 DIAGNOSIS — R300 Dysuria: Secondary | ICD-10-CM

## 2023-09-26 DIAGNOSIS — R102 Pelvic and perineal pain: Secondary | ICD-10-CM

## 2023-09-26 DIAGNOSIS — N952 Postmenopausal atrophic vaginitis: Secondary | ICD-10-CM

## 2023-09-26 DIAGNOSIS — R319 Hematuria, unspecified: Secondary | ICD-10-CM | POA: Diagnosis not present

## 2023-09-26 DIAGNOSIS — R82998 Other abnormal findings in urine: Secondary | ICD-10-CM

## 2023-09-26 MED ORDER — PREMARIN 0.625 MG/GM VA CREA
TOPICAL_CREAM | VAGINAL | 1 refills | Status: DC
Start: 2023-09-26 — End: 2023-11-21

## 2023-09-30 ENCOUNTER — Ambulatory Visit: Payer: 59 | Admitting: Physical Therapy

## 2023-10-03 ENCOUNTER — Other Ambulatory Visit: Payer: Self-pay | Admitting: Physician Assistant

## 2023-10-03 DIAGNOSIS — S0093XA Contusion of unspecified part of head, initial encounter: Secondary | ICD-10-CM

## 2023-10-03 DIAGNOSIS — R079 Chest pain, unspecified: Secondary | ICD-10-CM

## 2023-10-03 DIAGNOSIS — R42 Dizziness and giddiness: Secondary | ICD-10-CM

## 2023-10-07 ENCOUNTER — Encounter: Payer: Self-pay | Admitting: Physical Therapy

## 2023-10-07 ENCOUNTER — Ambulatory Visit
Admission: RE | Admit: 2023-10-07 | Discharge: 2023-10-07 | Disposition: A | Discharge status: Home / Self Care | Source: Ambulatory Visit | Attending: Physician Assistant | Admitting: Physician Assistant

## 2023-10-07 ENCOUNTER — Ambulatory Visit: Payer: 59 | Attending: Obstetrics and Gynecology | Admitting: Physical Therapy

## 2023-10-07 VITALS — BP 114/84 | HR 95

## 2023-10-07 DIAGNOSIS — R102 Pelvic and perineal pain: Secondary | ICD-10-CM | POA: Diagnosis present

## 2023-10-07 DIAGNOSIS — S0093XA Contusion of unspecified part of head, initial encounter: Secondary | ICD-10-CM | POA: Insufficient documentation

## 2023-10-07 DIAGNOSIS — M533 Sacrococcygeal disorders, not elsewhere classified: Secondary | ICD-10-CM | POA: Insufficient documentation

## 2023-10-07 DIAGNOSIS — R296 Repeated falls: Secondary | ICD-10-CM | POA: Insufficient documentation

## 2023-10-07 DIAGNOSIS — R42 Dizziness and giddiness: Secondary | ICD-10-CM | POA: Insufficient documentation

## 2023-10-07 DIAGNOSIS — M542 Cervicalgia: Secondary | ICD-10-CM | POA: Insufficient documentation

## 2023-10-07 DIAGNOSIS — R079 Chest pain, unspecified: Secondary | ICD-10-CM | POA: Insufficient documentation

## 2023-10-07 NOTE — Therapy (Signed)
 OUTPATIENT PHYSICAL THERAPY EVALUATION   Patient Name: Kimberly [name] MRN: [id number] DOB:Jul 28, 1961, 63 y.o., female Today's Date: 10/07/2023   PT End of Session - 10/07/23 1116     Visit Number 1    Number of Visits 10    Date for PT Re-Evaluation 12/16/23    PT Start Time 1105    PT Stop Time 1145    PT Time Calculation (min) 40 min    Activity Tolerance Patient tolerated treatment well;No increased pain    Behavior During Therapy WFL for tasks assessed/performed             Past Medical History:  Diagnosis Date   Anxiety    takes alprazolam - rare use    Arthritis    cervical spondylosis    Asthma    Balance problem    Brain cyst    Carpal tunnel syndrome, bilateral    Cervical fusion syndrome    Complete rupture of rotator cuff    Complication of anesthesia    lung collapsed after neck fusion   Degenerative disc disease, cervical    Depression    Difficult intubation    Small mouth opening, limited neck flexion, very anterior    Flu 08/10/2018   tested positive   GERD (gastroesophageal reflux disease)    pt. reports that its better, no meds in use at this time- 2015   Headache    Herpes    History of kidney stones    History of pneumonia    Hyperlipidemia    Hypertension    pt. doesn't see cardiologist, followed for HTN by Dr. Sherwood Gambler   Hypothyroidism    Inflammation of shoulder joint    Memory difficulties    Neuromuscular disorder (HCC)    joint and muscle problems   Occipital neuralgia    Pneumothorax on right    following C4-6 ACDF 07/04/16   Pseudoarthrosis of cervical spine (HCC)    Recurrent falls    Syncope    Urinary frequency    Past Surgical History:  Procedure Laterality Date   ABDOMINAL HYSTERECTOMY     ANTERIOR CERVICAL DECOMP/DISCECTOMY FUSION N/A 08/06/2014   Procedure: ANTERIOR CERVICAL DECOMPRESSION/DISCECTOMY FUSION CERVICAL THREE-FOUR,CERVICAL SIX-SEVEN ,CERVICAL SEVEN-THORACIC ONE;  Surgeon: Karn Cassis, MD;   Location: [facility] OR;  Service: Neurosurgery;  Laterality: N/A;   ANTERIOR CERVICAL DECOMP/DISCECTOMY FUSION N/A 07/04/2016   Procedure: CERVICAL FOUR-FIVE, CERVICAL FIVE-SIX ANTERIOR CERVICAL DECOMPRESSION/DISCECTOMY/FUSION WITH REVISION OF CERVICAL THREE-FOUR PLATE;  Surgeon: Hilda Lias, MD;  Location: [facility] OR;  Service: Neurosurgery;  Laterality: N/A;   CARPAL TUNNEL RELEASE Left 02/16/2013   Procedure: CARPAL TUNNEL RELEASE;  Surgeon: Vickki Hearing, MD;  Location: [facility];  Service: Orthopedics;  Laterality: Left;   CARPAL TUNNEL RELEASE Right 03/27/2013   Procedure: RIGHT CARPAL TUNNEL RELEASE;  Surgeon: Vickki Hearing, MD;  Location: [facility];  Service: Orthopedics;  Laterality: Right;   CESAREAN SECTION     x2   CHOLECYSTECTOMY N/A 06/17/2015   Procedure: LAPAROSCOPIC CHOLECYSTECTOMY;  Surgeon: Franky Macho, MD;  Location: [facility];  Service: General;  Laterality: N/A;   COLONOSCOPY     COLONOSCOPY WITH PROPOFOL N/A 05/06/2019   Procedure: COLONOSCOPY WITH PROPOFOL;  Surgeon: Earline Mayotte, MD;  Location: [facility];  Service: Endoscopy;  Laterality: N/A;   CYSTO WITH HYDRODISTENSION N/A 09/26/2021   Procedure: CYSTOSCOPY/HYDRODISTENSION;  Surgeon: Riki Altes, MD;  Location: [facility] OR[facility];  Service: Urology;  Laterality: N/A;   CYSTOSCOPY W/ RETROGRADES Bilateral 09/26/2021  Procedure: CYSTOSCOPY WITH RETROGRADE PYELOGRAM;  Surgeon: Riki Altes, MD;  Location: [facility] OR[facility];  Service: Urology;  Laterality: Bilateral;   CYSTOSCOPY WITH BIOPSY N/A 09/26/2021   Procedure: CYSTOSCOPY WITH BIOPSY;  Surgeon: Riki Altes, MD;  Location: [facility];  Service: Urology;  Laterality: N/A;   ESOPHAGOGASTRODUODENOSCOPY N/A 05/08/2021   Procedure: ESOPHAGOGASTRODUODENOSCOPY (EGD);  Surgeon: Regis Bill, MD;  Location: [facility] END[facility]COPY;  Service: Endoscopy;  Laterality: N/A;   ESOPHAGOGASTRODUODENOSCOPY  11/25/2017   EXCISION VAGINAL CYST N/A 12/22/2021   Procedure: EXCISION  VULVAR CYST;  Surgeon: Christeen Douglas, MD;  Location: [facility] OR[facility];  Service: Gynecology;  Laterality: N/A;   EXTRACORPOREAL SHOCK WAVE LITHOTRIPSY Left 09/04/2018   Procedure: EXTRACORPOREAL SHOCK WAVE LITHOTRIPSY (ESWL);  Surgeon: Sondra Come, MD;  Location: [facility];  Service: Urology;  Laterality: Left;   FOREIGN BODY REMOVAL Left    knee-as child   KNEE ARTHROSCOPY     NASAL SINUS SURGERY N/A 04/13/2015   Procedure: nasal endoscopy with adenoid biopsy;  Surgeon: Melvenia Beam, MD;  Location: [facility] OR;  Service: ENT;  Laterality: N/A;   NECK SURGERY  2016   x 3 all together   POSTERIOR CERVICAL FUSION/FORAMINOTOMY N/A 11/13/2016   Procedure: CERVICAL TWO-CERVICAL SIX POSTERIOR CERVICAL FUSION WITH LATERAL MASS FIXATION;  Surgeon: Barnett Abu, MD;  Location: [facility] OR;  Service: Neurosurgery;  Laterality: N/A;  posterior approach   REDUCTION MAMMAPLASTY  10/2019   ROTATOR CUFF REPAIR Bilateral 2014   SHOULDER ACROMIOPLASTY Left 05/18/2015   Procedure: SHOULDER ACROMIOPLASTY;  Surgeon: Frederico Hamman, MD;  Location: [facility];  Service: Orthopedics;  Laterality: Left;   SHOULDER ARTHROSCOPY WITH DISTAL CLAVICLE RESECTION Left 05/18/2015   Procedure: LEFT SHOULDER ARTHROSCOPY WITH  DISTAL CLAVICLE RESECTION;  Surgeon: Frederico Hamman, MD;  Location: [facility];  Service: Orthopedics;  Laterality: Left;   URETEROSCOPY Bilateral 09/26/2021   Procedure: URETEROSCOPY;  Surgeon: Riki Altes, MD;  Location: [facility];  Service: Urology;  Laterality: Bilateral;   XI ROBOTIC ASSISTED OOPHORECTOMY N/A 12/22/2021   Procedure: XI ROBOTIC ASSISTED DIAGNOSTIC LAPAROSCOPY, LEFT OOPHORECTOMY, WITH RIGHT SALPINGECTOMY AND LYSIS OF ADHESIONS;  Surgeon: Christeen Douglas, MD;  Location: [facility];  Service: Gynecology;  Laterality: N/A;   Patient Active Problem List   Diagnosis Date Noted   Retroperitoneal lymphadenopathy 08/13/2021   Left lower quadrant abdominal pain  08/11/2021   Occipital headache 12/29/2019   Cervical myofascial pain syndrome 09/22/2019   Recurrent herpes simplex 07/16/2018   Pelvic pain in female 05/29/2018   Chronic pain syndrome 04/17/2018   Cervicalgia 04/17/2018   Controlled substance agreement signed 04/17/2018   Cervical fusion syndrome 03/24/2018   Hx of cervical spine surgery 03/24/2018   Depression, major, recurrent, mild (HCC) 11/21/2017   Poorly-controlled hypertension 11/21/2017   Depression, major, single episode, complete remission (HCC) 10/24/2017   Degenerative disc disease, cervical 08/28/2017   Vasovagal syncope 08/28/2017   Dizziness 08/22/2017   History of recent fall 08/22/2017   Hypothyroidism (acquired) 08/13/2017   Loss of memory 04/30/2017   Reaction to severe stress 02/14/2017   Pseudoarthrosis of cervical spine (HCC) 11/13/2016   Asthma 07/05/2016   Acute chest wall pain 07/05/2016   GERD (gastroesophageal reflux disease) 07/05/2016   Spontaneous pneumothorax    Medication overuse headache 04/19/2016   Chronic tension-type headache, not intractable 04/19/2016   Numbness and tingling 04/19/2016   Dysphagia 03/07/2016   Vitamin D deficiency 02/07/2016   Bilateral hand pain 02/06/2016   Cervical stenosis of  spinal canal 08/06/2014   Muscle weakness (generalized) 11/12/2013   Tight fascia 11/12/2013   Decreased range of motion of shoulder 11/12/2013   Cervical spondylosis without myelopathy 10/29/2013   S/P carpal tunnel release 02/19/2013   CTS (carpal tunnel syndrome) 02/19/2013   SHOULDER PAIN 10/06/2007   IMPINGEMENT SYNDROME 10/06/2007   RUPTURE ROTATOR CUFF 10/06/2007   HTN (hypertension) 10/03/2007    PCP: Burnadette Pop   REFERRING PROVIDER: Renita Papa DIAG: Pelvic pain Syndrome    Rationale for Evaluation and Treatment Rehabilitation  THERAPY DIAG:  Pelvic pain  Neck pain  Repeated falls  Sacrococcygeal disorders, not elsewhere classified  ONSET DATE:    SUBJECTIVE:                                                                                                                                                                                           SUBJECTIVE STATEMENT: 1) constant  pelvic pain:  Pt felt pain inside the vagina for the past 2 years. Gynecologist provided her cream to put inside. Pt applies it every other day and it relieves it by 1/10 but pain is still constant. Marland Kitchen "Excruciatingly and "like a mm spasm" "  6/10     Pt had pain during pelvic exam.  Denied burning with urinating / bowel movement. Denied noticing any bulge.  Pt is able to start and complete urination without difficulty.    2)  neck pain:  8/10  constant. Pt is planning to get pain medication after today's visit   3)  repeated falls: see ( pertinent Hx below)   4) L hip pain: started out of nowhere. One day she was sweeping. Pain feels "something is lying top of it) . Denied radiating pain. Worse with sweeping, bending.  7/10    PERTINENT HISTORY:               Frequent Falls: Has patient fallen in last 6 months? 3   (6 months ago due to food poisoning for fast food:  pt hit the floor and was unconscious and had a "big old knot on her forehead")   ( Jan/Feb 1.5 months ago: 2 falls in a row due to  food poisioning from fast food  and fell with injury to head to the floor, she fell unconscious and she woke on her own. She got up and felt sick again, got up on her stool, and then woke up again on the floor. PCP know about it)   Dizzy spells came on around 1.5 month after her falls that occurred back to back  ( see below under Falls Hx). Pt gets  the dizziness once she is up and standing but not with turning her head. Pt felt dizziness 3 different times.  "I feel woozy".  Pt drives but she had not been driving when she feels dizzy.  Yesterday, pt was getting up from the toilet and she felt dizzy and she had to lean on both sides against the walls to not fall.  Pt sits down  for 10-15 min and she feels better.   Blood in her urine for  3-4 years ago and"they did not give me anything to clear it up."  Urine is cloudy. Pt drinks water 4-5 (8 z bottles) per day. Pt has blood in her urine with every trip to the toilet. "It is like a light orange".  Denied blood in her underwear.   Pt eats only one  full meal once a day. Pt said during session today, "I feel dehydrated". Water was provided to pt.  Pt started to eat one meal day for the past 3 years because she heard to eat only one meal to not gain weight.   Chest pain/ mm spasms: for a 5 years. Pt has been on medication .  Pt had mm spasms on her L side. Pt went to the [facility] a week ago with mm spasms and  excruciating pain. Pt went through test which meg. Pt was prescribed medications for the mm spasms but she plans to pick them up today.   SOB: started a  month ago. Walking to the mailbox and coming back.     Pt is scheduled for CT scan after today's visit.   Medical Hx:  C section Abdominal hysterectomy Knee arthroscopy  L  Rotator cuff repair  B   Neck surgery  Shoulder arthroscopy with distal clavicle resection    Xi robotic assisted oophorectomy    PAIN: Are you having pain? Yes: see above   PRECAUTIONS: No   WEIGHT BEARING RESTRICTIONS: No  FALLS:  Has patient fallen in last 6 months? 3   (6 months ago due to food poisoning for fast food:  pt hit the floor and was unconscious and had a "big old knot on her forehead")   ( Jan/Feb 1.5 months ago: 2 falls in a row due to  food poisioning from fast food  and fell with injury to head to the floor, she fell unconscious and she woke on her own. She got up and felt sick again, got up on her stool, and then woke up again on the floor. PCP know about it)  Pt is scheduled for CT scan after today's visit.   LIVING ENVIRONMENT: Lives with: alone but her son comes and goes weekly  Lives in:  house Stairs: 4 STE in front, 5 back door, with rail,  one story house  Has following equipment at home: no  OCCUPATION: disabled    PLOF: pt 's husband died 6 months ago.   PATIENT GOALS:     OBJECTIVE:  Vitals:   10/07/23 1147  BP: 114/84  Pulse: 95     [facility] PT Assessment - 10/07/23 1155       Palpation   Spinal mobility L AROM with L hip pain in sidelfxion L    SI assessment  R shoulder, iliac crest higher, L base of patella higher in standing      Ambulation/Gait   Gait Comments 1.06 m/s, R hip hike,             [facility] Adult PT Treatment/Exercise -  10/07/23 1155       Self-Care   Self-Care Other Self-Care Comments    Other Self-Care Comments  inquired on interdisciplinary medical updates , inquired more information to not red flags, educated pt on signs for cardiac arrest and stroke, pt voiced repeated back and understands to seek immediate medial attention, monitored BP, provided water,      Therapeutic Activites    Therapeutic Activities Other Therapeutic Activities    Other Therapeutic Activities explained puasing between t/f to minimize dizziness , R sideflexion to stretch to minimize L hip pain,      Neuro Re-ed    Neuro Re-ed Details  cued for R sideflexion stretch to minimize L hip pain               HOME EXERCISE PROGRAM: See pt instruction section    ASSESSMENT:  CLINICAL IMPRESSION:  Pt is a  63 yo  who presents with  following issues which impact QOL, ADL,  fitness, social and community activities:      constant  pelvic pain  neck pain  repeated falls   L hip pain   Pt's musculoskeletal assessment revealed uneven pelvic girdle and shoulder height, asymmetries to gait pattern, limited spinal /pelvic mobility, poor body mechanics which places strain on the abdominal/pelvic floor mm. These are deficits that indicate an ineffective intraabdominal pressure system associated with increased risk for pt's Sx.    Pt was provided education on etiology of Sx with anatomy, physiology explanation  with images along with the benefits of customized pelvic PT Tx based on pt's medical conditions and musculoskeletal deficits.  Explained the physiology of deep core mm coordination and roles of pelvic floor function in urination, defecation, sexual function, and postural control with deep core mm system.    Regional interdependent approaches will yield greater benefits in pt's POC.   Pt is undergoing medical workup and has a scheduled CT scan this afternoon.  The following Sx are of concern:     Frequent Falls: Has patient fallen in last 6 months? 3  (6 months ago due to food poisoning for fast food:  pt hit the floor and was unconscious and had a "big old knot on her forehead")  ( Jan/Feb 1.5 months ago: 2 falls in a row due to  food poisioning from fast food  and fell with injury to head to the floor, she fell unconscious and she woke on her own. She got up and felt sick again, got up on her stool, and then woke up again on the floor. PCP know about it)   Dizzy spells came on around 1.5 month after her falls that occurred back to back  ( see below under Falls Hx). Pt gets the dizziness once she is up and standing but not with turning her head. Pt felt dizziness 3 different times.  "I feel woozy".  Pt drives but she had not been driving when she feels dizzy.  Yesterday, pt was getting up from the toilet and she felt dizzy and she had to lean on both sides against the walls to not fall.  Pt sits down for 10-15 min and she feels better.   Blood in her urine for  3-4 years ago and"they did not give me anything to clear it up."  Urine is cloudy. Pt drinks water 4-5 (8 z bottles) per day. Pt has blood in her urine with every trip to the toilet. "It is like a light orange".  Denied blood in  her underwear.   Pt eats only one  full meal once a day. Pt said during session today, "I feel dehydrated". Water was provided to pt.  Pt started to eat one meal day for the past 3 years because she heard to eat only  one meal to not gain weight.   Chest pain/ mm spasms: for a 5 years. Pt has been on medication .  Pt had mm spasms on her L side. Pt went to the [facility] a week ago with mm spasms and  excruciating pain. Pt went through test which meg. Pt was prescribed medications for the mm spasms but she plans to pick them up today.   SOB: started a  month ago. Walking to the mailbox and coming back.     Monitored BP today which was normal. Plan to communicate with interdisciplinary team. Plan to modify PT POC based on medical findings and precautions.   Plan to address realignment of spine/ pelvis at next session to help promote optimize IAP system for improved pelvic floor function, trunk stability, gait, balance, stabilization with mobility tasks.  Plan to address pelvic floor issues once pelvis and spine are realigned to yield better outcomes.     Pt benefits from skilled PT.       OBJECTIVE IMPAIRMENTS decreased activity tolerance, decreased coordination, decreased endurance, decreased mobility, difficulty walking, decreased ROM, decreased strength, decreased safety awareness, hypomobility, increased muscle spasms, impaired flexibility, improper body mechanics, postural dysfunction, and pain. scar restrictions   ACTIVITY LIMITATIONS  self-care, , home chores, work tasks    PARTICIPATION LIMITATIONS:  community  PERSONAL FACTORS  See pt additional medical Sx are also affecting patient's functional outcome.  Pt need medical work up   [facility] POTENTIAL: Good   CLINICAL DECISION MAKING: Evolving/moderate complexity   EVALUATION COMPLEXITY: Moderate    PATIENT EDUCATION:    Education details: Showed pt anatomy images. Explained muscles attachments/ connection, physiology of deep core system/ spinal- thoracic-pelvis-lower kinetic chain as they relate to pt's presentation, Sx, and past Hx. Explained what and how these areas of deficits need to be restored to balance and function    See  Therapeutic activity / neuromuscular re-education section  Answered pt's questions.   Person educated: Patient Education method: Explanation, Demonstration, Tactile cues, Verbal cues, and Handouts Education comprehension: verbalized understanding, returned demonstration, verbal cues required, tactile cues required, and needs further education     PLAN: PT FREQUENCY: 1x/week   PT DURATION: 10 weeks   PLANNED INTERVENTIONS: Therapeutic exercises, Therapeutic activity, Neuromuscular re-education, Balance training, Gait training, Patient/Family education, Self Care, Joint mobilization, Spinal mobilization, Moist heat, Taping, and Manual therapy, dry needling.   PLAN FOR NEXT SESSION: See clinical impression for plan     GOALS: Goals reviewed with patient? Yes  SHORT TERM GOALS: Target date: 11/04/2023    Pt will demo IND with HEP                    Baseline: Not IND            Goal status: INITIAL   LONG TERM GOALS: Target date: 12/16/2023    1.Pt will demo proper deep core coordination without chest breathing and optimal excursion of diaphragm/pelvic floor in order to promote spinal stability and pelvic floor function  Baseline: dyscoordination Goal status: INITIAL  2.  Pt will demo proper body mechanics in against gravity tasks and ADLs  work tasks, fitness  to minimize straining pelvic floor / back  Baseline: not IND, improper form that places strain on pelvic floor  Goal status: INITIAL    3. Pt will demo increased gait speed > 1.3 m/s with reciprocal gait pattern, longer stride length  in order to ambulate safely in community and return to fitness routine  Baseline:  1.06 m/s, R hip hike,  Goal status: INITIAL    4. Pt will demo levelled pelvic girdle and shoulder height in order to progress to deep core strengthening HEP and restore mobility at spine, pelvis, gait, posture minimize falls, and improve balance   Baseline: R shoulder and iliac crest higher than L   Goal status: INITIAL   5. Pt will be referred to gait training, falls prevention training PT to minimize falls  Baseline: frequent falls  Goal status: INITIAL      Mariane Masters, PT 10/07/2023, 11:23 AM

## 2023-10-07 NOTE — Patient Instructions (Signed)
 R sidebend to minimize L hip pain

## 2023-10-14 ENCOUNTER — Ambulatory Visit: Payer: 59 | Admitting: Physical Therapy

## 2023-10-14 ENCOUNTER — Ambulatory Visit: Admitting: Physical Therapy

## 2023-10-14 DIAGNOSIS — R102 Pelvic and perineal pain: Secondary | ICD-10-CM

## 2023-10-14 DIAGNOSIS — M533 Sacrococcygeal disorders, not elsewhere classified: Secondary | ICD-10-CM | POA: Diagnosis not present

## 2023-10-14 DIAGNOSIS — M542 Cervicalgia: Secondary | ICD-10-CM

## 2023-10-14 DIAGNOSIS — R296 Repeated falls: Secondary | ICD-10-CM

## 2023-10-14 NOTE — Therapy (Addendum)
 OUTPATIENT PHYSICAL THERAPY Treatment   Patient Name: NIYANA CHESBRO MRN: [id number] DOB:1960-10-17, 63 y.o., female Today's Date: 10/14/2023   PT End of Session - 10/14/23 0200     Visit Number 2    Number of Visits 10    Date for PT Re-Evaluation 12/16/23    PT Start Time 1105    PT Stop Time 1200    PT Time Calculation (min) 55 min    Activity Tolerance Patient tolerated treatment well;No increased pain    Behavior During Therapy WFL for tasks assessed/performed             Past Medical History:  Diagnosis Date   Anxiety    takes alprazolam - rare use    Arthritis    cervical spondylosis    Asthma    Balance problem    Brain cyst    Carpal tunnel syndrome, bilateral    Cervical fusion syndrome    Complete rupture of rotator cuff    Complication of anesthesia    lung collapsed after neck fusion   Degenerative disc disease, cervical    Depression    Difficult intubation    Small mouth opening, limited neck flexion, very anterior    Flu 08/10/2018   tested positive   GERD (gastroesophageal reflux disease)    pt. reports that its better, no meds in use at this time- 2015   Headache    Herpes    History of kidney stones    History of pneumonia    Hyperlipidemia    Hypertension    pt. doesn't see cardiologist, followed for HTN by Dr. Sherwood Gambler   Hypothyroidism    Inflammation of shoulder joint    Memory difficulties    Neuromuscular disorder (HCC)    joint and muscle problems   Occipital neuralgia    Pneumothorax on right    following C4-6 ACDF 07/04/16   Pseudoarthrosis of cervical spine (HCC)    Recurrent falls    Syncope    Urinary frequency    Past Surgical History:  Procedure Laterality Date   ABDOMINAL HYSTERECTOMY     ANTERIOR CERVICAL DECOMP/DISCECTOMY FUSION N/A 08/06/2014   Procedure: ANTERIOR CERVICAL DECOMPRESSION/DISCECTOMY FUSION CERVICAL THREE-FOUR,CERVICAL SIX-SEVEN ,CERVICAL SEVEN-THORACIC ONE;  Surgeon: Karn Cassis, MD;   Location: [facility] OR;  Service: Neurosurgery;  Laterality: N/A;   ANTERIOR CERVICAL DECOMP/DISCECTOMY FUSION N/A 07/04/2016   Procedure: CERVICAL FOUR-FIVE, CERVICAL FIVE-SIX ANTERIOR CERVICAL DECOMPRESSION/DISCECTOMY/FUSION WITH REVISION OF CERVICAL THREE-FOUR PLATE;  Surgeon: Hilda Lias, MD;  Location: [facility] OR;  Service: Neurosurgery;  Laterality: N/A;   CARPAL TUNNEL RELEASE Left 02/16/2013   Procedure: CARPAL TUNNEL RELEASE;  Surgeon: Vickki Hearing, MD;  Location: [facility]S;  Service: Orthopedics;  Laterality: Left;   CARPAL TUNNEL RELEASE Right 03/27/2013   Procedure: RIGHT CARPAL TUNNEL RELEASE;  Surgeon: Vickki Hearing, MD;  Location: [facility];  Service: Orthopedics;  Laterality: Right;   CESAREAN SECTION     x2   CHOLECYSTECTOMY N/A 06/17/2015   Procedure: LAPAROSCOPIC CHOLECYSTECTOMY;  Surgeon: Franky Macho, MD;  Location: [facility];  Service: General;  Laterality: N/A;   COLONOSCOPY     COLONOSCOPY WITH PROPOFOL N/A 05/06/2019   Procedure: COLONOSCOPY WITH PROPOFOL;  Surgeon: Earline Mayotte, MD;  Location: [facility] ENDOSCOPY;  Service: Endoscopy;  Laterality: N/A;   CYSTO WITH HYDRODISTENSION N/A 09/26/2021   Procedure: CYSTOSCOPY/HYDRODISTENSION;  Surgeon: Riki Altes, MD;  Location: [facility] ORS;  Service: Urology;  Laterality: N/A;   CYSTOSCOPY W/ RETROGRADES Bilateral 09/26/2021  Procedure: CYSTOSCOPY WITH RETROGRADE PYELOGRAM;  Surgeon: Riki Altes, MD;  Location: [facility] ORS;  Service: Urology;  Laterality: Bilateral;   CYSTOSCOPY WITH BIOPSY N/A 09/26/2021   Procedure: CYSTOSCOPY WITH BIOPSY;  Surgeon: Riki Altes, MD;  Location: [facility] ORS;  Service: Urology;  Laterality: N/A;   ESOPHAGOGASTRODUODENOSCOPY N/A 05/08/2021   Procedure: ESOPHAGOGASTRODUODENOSCOPY (EGD);  Surgeon: Regis Bill, MD;  Location: [facility] ENDOSCOPY;  Service: Endoscopy;  Laterality: N/A;   ESOPHAGOGASTRODUODENOSCOPY  11/25/2017   EXCISION VAGINAL CYST N/A 12/22/2021   Procedure: EXCISION  VULVAR CYST;  Surgeon: Christeen Douglas, MD;  Location: [facility] ORS;  Service: Gynecology;  Laterality: N/A;   EXTRACORPOREAL SHOCK WAVE LITHOTRIPSY Left 09/04/2018   Procedure: EXTRACORPOREAL SHOCK WAVE LITHOTRIPSY (ESWL);  Surgeon: Sondra Come, MD;  Location: [facility] ORS;  Service: Urology;  Laterality: Left;   FOREIGN BODY REMOVAL Left    knee-as child   KNEE ARTHROSCOPY     NASAL SINUS SURGERY N/A 04/13/2015   Procedure: nasal endoscopy with adenoid biopsy;  Surgeon: Melvenia Beam, MD;  Location: [facility] OR;  Service: ENT;  Laterality: N/A;   NECK SURGERY  2016   x 3 all together   POSTERIOR CERVICAL FUSION/FORAMINOTOMY N/A 11/13/2016   Procedure: CERVICAL TWO-CERVICAL SIX POSTERIOR CERVICAL FUSION WITH LATERAL MASS FIXATION;  Surgeon: Barnett Abu, MD;  Location: [facility] OR;  Service: Neurosurgery;  Laterality: N/A;  posterior approach   REDUCTION MAMMAPLASTY  10/2019   ROTATOR CUFF REPAIR Bilateral 2014   SHOULDER ACROMIOPLASTY Left 05/18/2015   Procedure: SHOULDER ACROMIOPLASTY;  Surgeon: Frederico Hamman, MD;  Location: [facility];  Service: Orthopedics;  Laterality: Left;   SHOULDER ARTHROSCOPY WITH DISTAL CLAVICLE RESECTION Left 05/18/2015   Procedure: LEFT SHOULDER ARTHROSCOPY WITH  DISTAL CLAVICLE RESECTION;  Surgeon: Frederico Hamman, MD;  Location: [facility];  Service: Orthopedics;  Laterality: Left;   URETEROSCOPY Bilateral 09/26/2021   Procedure: URETEROSCOPY;  Surgeon: Riki Altes, MD;  Location: [facility] ORS;  Service: Urology;  Laterality: Bilateral;   XI ROBOTIC ASSISTED OOPHORECTOMY N/A 12/22/2021   Procedure: XI ROBOTIC ASSISTED DIAGNOSTIC LAPAROSCOPY, LEFT OOPHORECTOMY, WITH RIGHT SALPINGECTOMY AND LYSIS OF ADHESIONS;  Surgeon: Christeen Douglas, MD;  Location: [facility] ORS;  Service: Gynecology;  Laterality: N/A;   Patient Active Problem List   Diagnosis Date Noted   Retroperitoneal lymphadenopathy 08/13/2021   Left lower quadrant abdominal pain  08/11/2021   Occipital headache 12/29/2019   Cervical myofascial pain syndrome 09/22/2019   Recurrent herpes simplex 07/16/2018   Pelvic pain in female 05/29/2018   Chronic pain syndrome 04/17/2018   Cervicalgia 04/17/2018   Controlled substance agreement signed 04/17/2018   Cervical fusion syndrome 03/24/2018   Hx of cervical spine surgery 03/24/2018   Depression, major, recurrent, mild (HCC) 11/21/2017   Poorly-controlled hypertension 11/21/2017   Depression, major, single episode, complete remission (HCC) 10/24/2017   Degenerative disc disease, cervical 08/28/2017   Vasovagal syncope 08/28/2017   Dizziness 08/22/2017   History of recent fall 08/22/2017   Hypothyroidism (acquired) 08/13/2017   Loss of memory 04/30/2017   Reaction to severe stress 02/14/2017   Pseudoarthrosis of cervical spine (HCC) 11/13/2016   Asthma 07/05/2016   Acute chest wall pain 07/05/2016   GERD (gastroesophageal reflux disease) 07/05/2016   Spontaneous pneumothorax    Medication overuse headache 04/19/2016   Chronic tension-type headache, not intractable 04/19/2016   Numbness and tingling 04/19/2016   Dysphagia 03/07/2016   Vitamin D deficiency 02/07/2016   Bilateral hand pain 02/06/2016   Cervical stenosis of  spinal canal 08/06/2014   Muscle weakness (generalized) 11/12/2013   Tight fascia 11/12/2013   Decreased range of motion of shoulder 11/12/2013   Cervical spondylosis without myelopathy 10/29/2013   S/P carpal tunnel release 02/19/2013   CTS (carpal tunnel syndrome) 02/19/2013   SHOULDER PAIN 10/06/2007   IMPINGEMENT SYNDROME 10/06/2007   RUPTURE ROTATOR CUFF 10/06/2007   HTN (hypertension) 10/03/2007    PCP: Burnadette Pop   REFERRING PROVIDER: Renita Papa DIAG: Pelvic pain Syndrome    Rationale for Evaluation and Treatment Rehabilitation  THERAPY DIAG:  Pelvic pain  Repeated falls  Neck pain  Sacrococcygeal disorders, not elsewhere classified  ONSET DATE:    SUBJECTIVE:       SUBJECTIVE STATEMENT TODAY: Pt was at church and had to have her sister help her with walking because she was off balance.  Pt reports her off balance and dizziness occurs when walking but not when turning her head when rolling in bed or changing positions                                                                                                                                                                                      SUBJECTIVE STATEMENT ON EVAL 10/07/23 : 1) constant  pelvic pain:  Pt felt pain inside the vagina for the past 2 years. Gynecologist provided her cream to put inside. Pt applies it every other day and it relieves it by 1/10 but pain is still constant. Marland Kitchen "Excruciatingly and "like a mm spasm" "  6/10     Pt had pain during pelvic exam.  Denied burning with urinating / bowel movement. Denied noticing any bulge.  Pt is able to start and complete urination without difficulty.    2)  neck pain:  8/10  constant. Pt is planning to get pain medication after today's visit   3)  repeated falls: see ( pertinent Hx below)   4) L hip pain: started out of nowhere. One day she was sweeping. Pain feels "something is lying top of it) . Denied radiating pain. Worse with sweeping, bending.  7/10    PERTINENT HISTORY:               Frequent Falls: Has patient fallen in last 6 months? 3   (6 months ago due to food poisoning for fast food:  pt hit the floor and was unconscious and had a "big old knot on her forehead")   ( Jan/Feb 1.5 months ago: 2 falls in a row due to  food poisioning from fast food  and fell with injury to head to the floor, she fell unconscious and she woke on her  own. She got up and felt sick again, got up on her stool, and then woke up again on the floor. PCP know about it)   Dizzy spells came on around 1.5 month after her falls that occurred back to back  ( see below under Falls Hx). Pt gets the dizziness once she is up and standing but not  with turning her head. Pt felt dizziness 3 different times.  "I feel woozy".  Pt drives but she had not been driving when she feels dizzy.  Yesterday, pt was getting up from the toilet and she felt dizzy and she had to lean on both sides against the walls to not fall.  Pt sits down for 10-15 min and she feels better.   Blood in her urine for  3-4 years ago and"they did not give me anything to clear it up."  Urine is cloudy. Pt drinks water 4-5 (8 z bottles) per day. Pt has blood in her urine with every trip to the toilet. "It is like a light orange".  Denied blood in her underwear.   Pt eats only one  full meal once a day. Pt said during session today, "I feel dehydrated". Water was provided to pt.  Pt started to eat one meal day for the past 3 years because she heard to eat only one meal to not gain weight.   Chest pain/ mm spasms: for a 5 years. Pt has been on medication .  Pt had mm spasms on her L side. Pt went to the [facility] a week ago with mm spasms and  excruciating pain. Pt went through test which meg. Pt was prescribed medications for the mm spasms but she plans to pick them up today.   SOB: started a  month ago. Walking to the mailbox and coming back.     Pt is scheduled for CT scan after today's visit.   Medical Hx:  C section Abdominal hysterectomy Knee arthroscopy  L  Rotator cuff repair  B   Neck surgery  Shoulder arthroscopy with distal clavicle resection    Xi robotic assisted oophorectomy    PAIN: Are you having pain? Yes: see above   PRECAUTIONS: No   WEIGHT BEARING RESTRICTIONS: No  FALLS:  Has patient fallen in last 6 months? 3   (6 months ago due to food poisoning for fast food:  pt hit the floor and was unconscious and had a "big old knot on her forehead")   ( Jan/Feb 1.5 months ago: 2 falls in a row due to  food poisioning from fast food  and fell with injury to head to the floor, she fell unconscious and she woke on her own. She got up and felt  sick again, got up on her stool, and then woke up again on the floor. PCP know about it)  Pt is scheduled for CT scan after today's visit.   LIVING ENVIRONMENT: Lives with: alone but her son comes and goes weekly  Lives in:  house Stairs: 4 STE in front, 5 back door, with rail, one story house  Has following equipment at home: no  OCCUPATION: disabled    PLOF: pt 's husband died 6 months ago.   PATIENT GOALS:     OBJECTIVE:      [facility] PT Assessment - 10/14/23 1115       Palpation   SI assessment  standing. sitting : L shoulder/ iliac crest lower,  supine:  R 81 cm R, L 80  cm from ASIS to medial malleoli  , L low rib , ASIS and medial malleoli higher than R    Palpation comment tightness along L glut/ SIJ,  LKC with foot      Ambulation/Gait   Gait Comments 1.23  m/s with reciprocal gait with shoe lift in L shoe toe box and heel               [facility] Adult PT Treatment/Exercise - 10/14/23 1115       Self-Care   Other Self-Care Comments  provided shoe lift in toe box , heel  in L foot , advised to remove if it causes pain, educated on buying new shoes and what to look for      Modalities   Modalities Moist Heat      Moist Heat Therapy   Number Minutes Moist Heat --   5 (unbilled), R trunk rotation to promote lengtheing at L SIJ/ side ,  butterfly supported to promote hip ER/abd     Manual Therapy   Manual therapy comments STM/MWM at L glut, SIJ, tib-fib, midfoot joints L to promote mobility               HOME EXERCISE PROGRAM: See pt instruction section    ASSESSMENT:  CLINICAL IMPRESSION:                   Addressed misalignment of spine and pelvis. Leg length difference was evident with L leg shorter than R.  Shoe lift was provided in toe box and heel in L shoe which levelled pelvis and shoulder. Manual Tx was provided to provide L SIJ and LKC mobility. Pt reported 99% better and did not feel off balance when she has the shoe lift placed.  Cued for  lifting feet and knees with walking. Gait speed improved and no longer showed R trunk lean ; reciprocal gait was restored.  Plan to further address L T/L junction which showed more tightness along paraspinal mm and will further help with aligning spine and shoulder to help with balance and mobility.   Regional interdependent approaches will yield greater benefits in pt's POC.      Anticipate today's improvements will help to promote optimize IAP system for improved pelvic floor function, trunk stability, gait, balance, stabilization with mobility tasks.  Plan to address pelvic floor issues once pelvis and spine are realigned to yield better outcomes.     Pt benefits from skilled PT.       OBJECTIVE IMPAIRMENTS decreased activity tolerance, decreased coordination, decreased endurance, decreased mobility, difficulty walking, decreased ROM, decreased strength, decreased safety awareness, hypomobility, increased muscle spasms, impaired flexibility, improper body mechanics, postural dysfunction, and pain. scar restrictions   ACTIVITY LIMITATIONS  self-care, , home chores, work tasks    PARTICIPATION LIMITATIONS:  community  PERSONAL FACTORS  See pt additional medical Sx are also affecting patient's functional outcome.  Pt need medical work up   [facility] POTENTIAL: Good   CLINICAL DECISION MAKING: Evolving/moderate complexity   EVALUATION COMPLEXITY: Moderate    PATIENT EDUCATION:    Education details: Showed pt anatomy images. Explained muscles attachments/ connection, physiology of deep core system/ spinal- thoracic-pelvis-lower kinetic chain as they relate to pt's presentation, Sx, and past Hx. Explained what and how these areas of deficits need to be restored to balance and function    See Therapeutic activity / neuromuscular re-education section  Answered pt's questions.   Person educated: Patient Education method: Explanation, Demonstration, Tactile cues, Verbal  cues, and  Handouts Education comprehension: verbalized understanding, returned demonstration, verbal cues required, tactile cues required, and needs further education     PLAN: PT FREQUENCY: 1x/week   PT DURATION: 10 weeks   PLANNED INTERVENTIONS: Therapeutic exercises, Therapeutic activity, Neuromuscular re-education, Balance training, Gait training, Patient/Family education, Self Care, Joint mobilization, Spinal mobilization, Moist heat, Taping, and Manual therapy, dry needling.   PLAN FOR NEXT SESSION: See clinical impression for plan     GOALS: Goals reviewed with patient? Yes  SHORT TERM GOALS: Target date: 11/04/2023    Pt will demo IND with HEP                    Baseline: Not IND            Goal status: INITIAL   LONG TERM GOALS: Target date: 12/16/2023    1.Pt will demo proper deep core coordination without chest breathing and optimal excursion of diaphragm/pelvic floor in order to promote spinal stability and pelvic floor function  Baseline: dyscoordination Goal status: INITIAL  2.  Pt will demo proper body mechanics in against gravity tasks and ADLs  work tasks, fitness  to minimize straining pelvic floor / back    Baseline: not IND, improper form that places strain on pelvic floor  Goal status: INITIAL    3. Pt will demo increased gait speed > 1.3 m/s with reciprocal gait pattern, longer stride length  in order to ambulate safely in community and return to fitness routine  Baseline:  1.06 m/s, R hip hike,  Goal status: INITIAL    4. Pt will demo levelled pelvic girdle and shoulder height in order to progress to deep core strengthening HEP and restore mobility at spine, pelvis, gait, posture minimize falls, and improve balance   Baseline: R shoulder and iliac crest higher than L  Goal status: INITIAL   5. Pt will be referred to gait training, falls prevention training PT to minimize falls  Baseline: frequent falls  Goal status: INITIAL      Mariane Masters,  PT 10/14/2023, 2:01 PM

## 2023-10-14 NOTE — Patient Instructions (Signed)
 Avoid straining pelvic floor, abdominal muscles , spine  Use log rolling technique instead of getting out of bed with your neck or the sit-up   Log rolling into and out of bed  Log rolling into and out of bed If getting out of bed on R side, Bent knees, scoot hips/ shoulder to L  Raise R arm completely overhead, rolling onto armpit  Then lower bent knees to bed to get into complete side lying position  Then drop legs off bed, and push up onto R elbow/forearm, and use L hand to push onto the bed  __ Proper body mechanics with getting out of a chair to decrease strain  on back &pelvic floor   Avoid holding your breath when Getting out of the chair:  Scoot to front part of chair chair Heels behind knees, feet are hip width apart, nose over toes  Inhale like you are smelling roses Exhale to stand  ___  Sitting with feet on ground, four points of contact Catch yourself crossing ankles and thighs  __  Try shoe lift in toe box and heel in L shoe this week. Remove if causing more pain and dizziness/ off balance   ___

## 2023-10-15 ENCOUNTER — Ambulatory Visit: Admitting: Physical Therapy

## 2023-10-16 ENCOUNTER — Encounter: Admitting: Physical Therapy

## 2023-10-21 ENCOUNTER — Ambulatory Visit: Payer: 59 | Admitting: Physical Therapy

## 2023-10-23 ENCOUNTER — Encounter: Admitting: Physical Therapy

## 2023-10-24 ENCOUNTER — Ambulatory Visit: Admitting: Physical Therapy

## 2023-10-25 ENCOUNTER — Other Ambulatory Visit: Payer: Self-pay

## 2023-10-25 ENCOUNTER — Emergency Department

## 2023-10-25 ENCOUNTER — Emergency Department
Admission: EM | Admit: 2023-10-25 | Discharge: 2023-10-25 | Disposition: A | Discharge status: Home / Self Care | Attending: Emergency Medicine | Admitting: Emergency Medicine

## 2023-10-25 DIAGNOSIS — S0181XA Laceration without foreign body of other part of head, initial encounter: Secondary | ICD-10-CM | POA: Insufficient documentation

## 2023-10-25 DIAGNOSIS — W0110XA Fall on same level from slipping, tripping and stumbling with subsequent striking against unspecified object, initial encounter: Secondary | ICD-10-CM | POA: Diagnosis not present

## 2023-10-25 DIAGNOSIS — R55 Syncope and collapse: Secondary | ICD-10-CM | POA: Diagnosis present

## 2023-10-25 DIAGNOSIS — R1110 Vomiting, unspecified: Secondary | ICD-10-CM | POA: Insufficient documentation

## 2023-10-25 DIAGNOSIS — R531 Weakness: Secondary | ICD-10-CM | POA: Diagnosis present

## 2023-10-25 DIAGNOSIS — R197 Diarrhea, unspecified: Secondary | ICD-10-CM | POA: Insufficient documentation

## 2023-10-25 DIAGNOSIS — S0990XA Unspecified injury of head, initial encounter: Secondary | ICD-10-CM

## 2023-10-25 LAB — BASIC METABOLIC PANEL WITH GFR
Anion gap: 12 (ref 5–15)
BUN: 18 mg/dL (ref 8–23)
CO2: 24 mmol/L (ref 22–32)
Calcium: 9.6 mg/dL (ref 8.9–10.3)
Chloride: 101 mmol/L (ref 98–111)
Creatinine, Ser: 1.62 mg/dL — ABNORMAL HIGH (ref 0.44–1.00)
GFR, Estimated: 36 mL/min — ABNORMAL LOW (ref >60)
Glucose, Bld: 114 mg/dL — ABNORMAL HIGH (ref 70–99)
Potassium: 3.3 mmol/L — ABNORMAL LOW (ref 3.5–5.1)
Sodium: 137 mmol/L (ref 135–145)

## 2023-10-25 LAB — CBC
HCT: 41.4 % (ref 36.0–46.0)
Hemoglobin: 13.4 g/dL (ref 12.0–15.0)
MCH: 30.1 pg (ref 26.0–34.0)
MCHC: 32.4 g/dL (ref 30.0–36.0)
MCV: 93 fL (ref 80.0–100.0)
Platelets: 274 10*3/uL (ref 150–400)
RBC: 4.45 MIL/uL (ref 3.87–5.11)
RDW: 12.5 % (ref 11.5–15.5)
WBC: 11.6 10*3/uL — ABNORMAL HIGH (ref 4.0–10.5)
nRBC: 0 % (ref 0.0–0.2)

## 2023-10-25 LAB — URINALYSIS, ROUTINE W REFLEX MICROSCOPIC
Bilirubin Urine: NEGATIVE
Glucose, UA: NEGATIVE mg/dL
Ketones, ur: NEGATIVE mg/dL
Leukocytes,Ua: NEGATIVE
Nitrite: NEGATIVE
Protein, ur: NEGATIVE mg/dL
Specific Gravity, Urine: 1.019 (ref 1.005–1.030)
pH: 5 (ref 5.0–8.0)

## 2023-10-25 MED ORDER — ONDANSETRON HCL 4 MG/2ML IJ SOLN
4.0000 mg | Freq: Once | INTRAMUSCULAR | Status: AC
  Administered 2023-10-25: 4 mg via INTRAVENOUS
  Filled 2023-10-25: qty 2

## 2023-10-25 MED ORDER — SODIUM CHLORIDE 0.9 % IV BOLUS
1000.0000 mL | Freq: Once | INTRAVENOUS | Status: AC
  Administered 2023-10-25: 1000 mL via INTRAVENOUS

## 2023-10-25 NOTE — ED Provider Notes (Signed)
 [facility] Provider Note    Event Date/Time   First MD Initiated Contact with Patient 10/25/23 1736     (approximate)   History   Loss of Consciousness   HPI  Kimberly [name] is a 63 year old female presenting to the emergency department for evaluation of weakness.  On Wednesday, she had a salad with ranch dressing.  Shortly after she developed multiple episodes of nonbloody vomiting and diarrhea.  During this time, she began to feel lightheaded and did have a syncopal episode on Wednesday.  She fell and hit the top of her head with a small cut.  She was unable to get to her primary care doctor, but has been taking Zofran with improvement in her symptoms more recently.  Currently reports some ongoing lightheadedness and headache.  No abdominal pain.  No fevers or chills.  No numbness, tingling or focal weakness.     Physical Exam   Triage Vital Signs: ED Triage Vitals  Encounter Vitals Group     BP 10/25/23 1617 100/72     Systolic BP Percentile --      Diastolic BP Percentile --      Pulse Rate 10/25/23 1614 90     Resp 10/25/23 1617 20     Temp 10/25/23 1614 98 F (36.7 C)     Temp Source 10/25/23 1614 Oral     SpO2 10/25/23 1617 92 %     Weight --      Height --      Head Circumference --      Peak Flow --      Pain Score 10/25/23 1616 5     Pain Loc --      Pain Education --      Exclude from Growth Chart --     Most recent vital signs: Vitals:   10/25/23 2045 10/25/23 2100  BP: 135/86 (!) 116/94  Pulse: 74 84  Resp: (!) 22 (!) 23  Temp:    SpO2: 99% 96%     General: Awake, interactive  Head:  Small healing shallow laceration over the forehead Resp:  Unlabored respirations, lungs clear to auscultation CV:  Regular rate and rhythm Abd:  Nondistended, soft, nontender to palpation Neuro:  Symmetric facial movement, fluid speech, 5-5 strength in the bilateral upper and lower extremities with normal sensation, normal finger-to-nose  testing  Skin:  Area of ecchymosis over the left arm just just distal to the elbow   ED Results / Procedures / Treatments   Labs (all labs ordered are listed, but only abnormal results are displayed) Labs Reviewed  BASIC METABOLIC PANEL WITH GFR - Abnormal; Notable for the following components:      Result Value   Potassium 3.3 (*)    Glucose, Bld 114 (*)    Creatinine, Ser 1.62 (*)    GFR, Estimated 36 (*)    All other components within normal limits  CBC - Abnormal; Notable for the following components:   WBC 11.6 (*)    All other components within normal limits  URINALYSIS, ROUTINE W REFLEX MICROSCOPIC - Abnormal; Notable for the following components:   Color, Urine YELLOW (*)    APPearance HAZY (*)    Hgb urine dipstick SMALL (*)    Bacteria, UA MANY (*)    All other components within normal limits  URINE CULTURE  CBG MONITORING, ED     EKG EKG independently reviewed interpreted by myself (ER attending) demonstrates:  EKG demonstrates normal  sinus rhythm rate 85, PR 136, QRS 66, QTc 418, no acute ST changes  RADIOLOGY Imaging independently reviewed and interpreted by myself demonstrates:  CXR without focal consolidation CT head without acute bleed  PROCEDURES:  Critical Care performed: No  Procedures   MEDICATIONS ORDERED IN ED: Medications  ondansetron (ZOFRAN) injection 4 mg (4 mg Intravenous Given 10/25/23 1931)  sodium chloride 0.9 % bolus 1,000 mL (1,000 mLs Intravenous New Bag/Given 10/25/23 1932)     IMPRESSION / MDM / ASSESSMENT AND PLAN / ED COURSE  I reviewed the triage vital signs and the nursing notes.  Differential diagnosis includes, but is not limited to, viral GI illness, low suspicion acute intra-abdominal process given reassuring abdominal exam, consideration for anemia, electrolyte abnormality, intracranial bleed, skull fracture, no evidence of thoracoabdominal trauma  Patient's presentation is most consistent with acute presentation with  potential threat to life or bodily function.  63 year old female presenting with vomiting, diarrhea, syncope.  Stable vitals on presentation.  Labs with mild leukocytosis, AKI with creatinine of 1.62, recently 0.6.  Urine overall not suggestive of infection.  Patient denying urinary symptoms, will send culture but hold off on empiric treatment.  CT head reassuring.  Suspect patient's dizziness may be related to dehydration in the setting of her recent GI symptoms as well as possible mild concussive symptoms given her recent head injury.  She was treated with IV fluids and Zofran.  On reevaluation she felt much improved and was tolerating p.o. intake.  She does have an AKI here, but with improvement in her GI symptoms, do think discharge with outpatient follow-up is reasonable.  Patient is comfortable this plan.  Strict return precautions provided.      FINAL CLINICAL IMPRESSION(S) / ED DIAGNOSES   Final diagnoses:  Syncope and collapse  Vomiting and diarrhea  Closed head injury, initial encounter     Rx / DC Orders   ED Discharge Orders     None        Note:  This document was prepared using Dragon voice recognition software and may include unintentional dictation errors.   Trinna Post, MD 10/25/23 2124

## 2023-10-25 NOTE — ED Triage Notes (Signed)
 Pt to ED via POV from [facility]. Pt reports Wednesday had food poisoning with N/V/D. Pt reports since has been feeling dizziness and unsteady and fell on Wednesday. Pt has laceration to her forehead and bruising to left arm.

## 2023-10-25 NOTE — Discharge Instructions (Addendum)
 Your testing today was fortunately overall reassuring.  I suspect you are likely dehydrated from your recent GI symptoms, but you may also have a mild concussion that is contributing to your lightheadedness.  Your blood work here did show signs of dehydration.  Follow-up with your primary care doctor for further evaluation and consideration of repeat labs.  Return to the ER for new or worsening symptoms.

## 2023-10-27 LAB — URINE CULTURE: Culture: <10000 — AB

## 2023-10-28 ENCOUNTER — Ambulatory Visit: Payer: 59 | Admitting: Physical Therapy

## 2023-10-30 ENCOUNTER — Encounter: Admitting: Physical Therapy

## 2023-10-31 ENCOUNTER — Ambulatory Visit: Admitting: Physical Therapy

## 2023-11-04 ENCOUNTER — Ambulatory Visit: Payer: 59 | Admitting: Physical Therapy

## 2023-11-06 ENCOUNTER — Encounter: Admitting: Physical Therapy

## 2023-11-07 ENCOUNTER — Ambulatory Visit: Attending: Obstetrics and Gynecology | Admitting: Physical Therapy

## 2023-11-07 DIAGNOSIS — R102 Pelvic and perineal pain: Secondary | ICD-10-CM | POA: Insufficient documentation

## 2023-11-07 DIAGNOSIS — M533 Sacrococcygeal disorders, not elsewhere classified: Secondary | ICD-10-CM | POA: Diagnosis present

## 2023-11-07 DIAGNOSIS — R296 Repeated falls: Secondary | ICD-10-CM | POA: Insufficient documentation

## 2023-11-07 DIAGNOSIS — M542 Cervicalgia: Secondary | ICD-10-CM | POA: Insufficient documentation

## 2023-11-07 NOTE — Therapy (Addendum)
 OUTPATIENT PHYSICAL THERAPY TREATMENT   Patient Name: MACHELLE RAYBON MRN: [id number] DOB:02-Jan-1961, 63 y.o., female Today's Date: 11/07/2023   PT End of Session - 11/07/23 1337     Visit Number 3    Number of Visits 10    Date for PT Re-Evaluation 12/16/23    PT Start Time 1335    PT Stop Time 1415    PT Time Calculation (min) 40 min    Activity Tolerance Patient tolerated treatment well;No increased pain    Behavior During Therapy WFL for tasks assessed/performed             Past Medical History:  Diagnosis Date   Anxiety    takes alprazolam  - rare use    Arthritis    cervical spondylosis    Asthma    Balance problem    Brain cyst    Carpal tunnel syndrome, bilateral    Cervical fusion syndrome    Complete rupture of rotator cuff    Complication of anesthesia    lung collapsed after neck fusion   Degenerative disc disease, cervical    Depression    Difficult intubation    Small mouth opening, limited neck flexion, very anterior    Flu 08/10/2018   tested positive   GERD (gastroesophageal reflux disease)    pt. reports that its better, no meds in use at this time- 2015   Headache    Herpes    History of kidney stones    History of pneumonia    Hyperlipidemia    Hypertension    pt. doesn't see cardiologist, followed for HTN by Dr. Lewayne Records   Hypothyroidism    Inflammation of shoulder joint    Memory difficulties    Neuromuscular disorder (HCC)    joint and muscle problems   Occipital neuralgia    Pneumothorax on right    following C4-6 ACDF 07/04/16   Pseudoarthrosis of cervical spine (HCC)    Recurrent falls    Syncope    Urinary frequency    Past Surgical History:  Procedure Laterality Date   ABDOMINAL HYSTERECTOMY     ANTERIOR CERVICAL DECOMP/DISCECTOMY FUSION N/A 08/06/2014   Procedure: ANTERIOR CERVICAL DECOMPRESSION/DISCECTOMY FUSION CERVICAL THREE-FOUR,CERVICAL SIX-SEVEN ,CERVICAL SEVEN-THORACIC ONE;  Surgeon: Adelbert Adler, MD;  Location:  [facility] OR;  Service: Neurosurgery;  Laterality: N/A;   ANTERIOR CERVICAL DECOMP/DISCECTOMY FUSION N/A 07/04/2016   Procedure: CERVICAL FOUR-FIVE, CERVICAL FIVE-SIX ANTERIOR CERVICAL DECOMPRESSION/DISCECTOMY/FUSION WITH REVISION OF CERVICAL THREE-FOUR PLATE;  Surgeon: Claudetta Cuba, MD;  Location: [facility] OR;  Service: Neurosurgery;  Laterality: N/A;   CARPAL TUNNEL RELEASE Left 02/16/2013   Procedure: CARPAL TUNNEL RELEASE;  Surgeon: Darrin Emerald, MD;  Location: [facility]S;  Service: Orthopedics;  Laterality: Left;   CARPAL TUNNEL RELEASE Right 03/27/2013   Procedure: RIGHT CARPAL TUNNEL RELEASE;  Surgeon: Darrin Emerald, MD;  Location: [facility];  Service: Orthopedics;  Laterality: Right;   CESAREAN SECTION     x2   CHOLECYSTECTOMY N/A 06/17/2015   Procedure: LAPAROSCOPIC CHOLECYSTECTOMY;  Surgeon: Alanda Allegra, MD;  Location: [facility];  Service: General;  Laterality: N/A;   COLONOSCOPY     COLONOSCOPY WITH PROPOFOL  N/A 05/06/2019   Procedure: COLONOSCOPY WITH PROPOFOL ;  Surgeon: Marshall Skeeter, MD;  Location: [facility];  Service: Endoscopy;  Laterality: N/A;   CYSTO WITH HYDRODISTENSION N/A 09/26/2021   Procedure: CYSTOSCOPY/HYDRODISTENSION;  Surgeon: Geraline Knapp, MD;  Location: [facility] ORS;  Service: Urology;  Laterality: N/A;   CYSTOSCOPY W/ RETROGRADES Bilateral 09/26/2021  Procedure: CYSTOSCOPY WITH RETROGRADE PYELOGRAM;  Surgeon: Geraline Knapp, MD;  Location: [facility] ORS;  Service: Urology;  Laterality: Bilateral;   CYSTOSCOPY WITH BIOPSY N/A 09/26/2021   Procedure: CYSTOSCOPY WITH BIOPSY;  Surgeon: Geraline Knapp, MD;  Location: [facility]S;  Service: Urology;  Laterality: N/A;   ESOPHAGOGASTRODUODENOSCOPY N/A 05/08/2021   Procedure: ESOPHAGOGASTRODUODENOSCOPY (EGD);  Surgeon: Shane Darling, MD;  Location: [facility] ENDOSCOPY;  Service: Endoscopy;  Laterality: N/A;   ESOPHAGOGASTRODUODENOSCOPY  11/25/2017   EXCISION VAGINAL CYST N/A 12/22/2021   Procedure: EXCISION VULVAR CYST;   Surgeon: Prescilla Brod, MD;  Location: [facility] ORS;  Service: Gynecology;  Laterality: N/A;   EXTRACORPOREAL SHOCK WAVE LITHOTRIPSY Left 09/04/2018   Procedure: EXTRACORPOREAL SHOCK WAVE LITHOTRIPSY (ESWL);  Surgeon: Lawerence Pressman, MD;  Location: [facility] ORS;  Service: Urology;  Laterality: Left;   FOREIGN BODY REMOVAL Left    knee-as child   KNEE ARTHROSCOPY     NASAL SINUS SURGERY N/A 04/13/2015   Procedure: nasal endoscopy with adenoid biopsy;  Surgeon: Littie Rife, MD;  Location: [facility] OR;  Service: ENT;  Laterality: N/A;   NECK SURGERY  2016   x 3 all together   POSTERIOR CERVICAL FUSION/FORAMINOTOMY N/A 11/13/2016   Procedure: CERVICAL TWO-CERVICAL SIX POSTERIOR CERVICAL FUSION WITH LATERAL MASS FIXATION;  Surgeon: Elna Haggis, MD;  Location: [facility] OR;  Service: Neurosurgery;  Laterality: N/A;  posterior approach   REDUCTION MAMMAPLASTY  10/2019   ROTATOR CUFF REPAIR Bilateral 2014   SHOULDER ACROMIOPLASTY Left 05/18/2015   Procedure: SHOULDER ACROMIOPLASTY;  Surgeon: Marlena Sima, MD;  Location: [facility];  Service: Orthopedics;  Laterality: Left;   SHOULDER ARTHROSCOPY WITH DISTAL CLAVICLE RESECTION Left 05/18/2015   Procedure: LEFT SHOULDER ARTHROSCOPY WITH  DISTAL CLAVICLE RESECTION;  Surgeon: Marlena Sima, MD;  Location: [facility];  Service: Orthopedics;  Laterality: Left;   URETEROSCOPY Bilateral 09/26/2021   Procedure: URETEROSCOPY;  Surgeon: Geraline Knapp, MD;  Location: [facility] ORS;  Service: Urology;  Laterality: Bilateral;   XI ROBOTIC ASSISTED OOPHORECTOMY N/A 12/22/2021   Procedure: XI ROBOTIC ASSISTED DIAGNOSTIC LAPAROSCOPY, LEFT OOPHORECTOMY, WITH RIGHT SALPINGECTOMY AND LYSIS OF ADHESIONS;  Surgeon: Prescilla Brod, MD;  Location: [facility];  Service: Gynecology;  Laterality: N/A;   Patient Active Problem List   Diagnosis Date Noted   Retroperitoneal lymphadenopathy 08/13/2021   Left lower quadrant abdominal pain 08/11/2021   Occipital  headache 12/29/2019   Cervical myofascial pain syndrome 09/22/2019   Recurrent herpes simplex 07/16/2018   Pelvic pain in female 05/29/2018   Chronic pain syndrome 04/17/2018   Cervicalgia 04/17/2018   Controlled substance agreement signed 04/17/2018   Cervical fusion syndrome 03/24/2018   Hx of cervical spine surgery 03/24/2018   Depression, major, recurrent, mild (HCC) 11/21/2017   Poorly-controlled hypertension 11/21/2017   Depression, major, single episode, complete remission (HCC) 10/24/2017   Degenerative disc disease, cervical 08/28/2017   Vasovagal syncope 08/28/2017   Dizziness 08/22/2017   History of recent fall 08/22/2017   Hypothyroidism (acquired) 08/13/2017   Loss of memory 04/30/2017   Reaction to severe stress 02/14/2017   Pseudoarthrosis of cervical spine (HCC) 11/13/2016   Asthma 07/05/2016   Acute chest wall pain 07/05/2016   GERD (gastroesophageal reflux disease) 07/05/2016   Spontaneous pneumothorax    Medication overuse headache 04/19/2016   Chronic tension-type headache, not intractable 04/19/2016   Numbness and tingling 04/19/2016   Dysphagia 03/07/2016   Vitamin D deficiency 02/07/2016   Bilateral hand pain 02/06/2016   Cervical stenosis of  spinal canal 08/06/2014   Muscle weakness (generalized) 11/12/2013   Tight fascia 11/12/2013   Decreased range of motion of shoulder 11/12/2013   Cervical spondylosis without myelopathy 10/29/2013   S/P carpal tunnel release 02/19/2013   CTS (carpal tunnel syndrome) 02/19/2013   SHOULDER PAIN 10/06/2007   IMPINGEMENT SYNDROME 10/06/2007   RUPTURE ROTATOR CUFF 10/06/2007   HTN (hypertension) 10/03/2007    PCP: Jerone Moorman   REFERRING PROVIDER: Tish Forge DIAG: Pelvic pain Syndrome    Rationale for Evaluation and Treatment Rehabilitation  THERAPY DIAG: Pelvic pain   Repeated falls   Neck pain   Sacrococcygeal disorders, not elsewhere classified  ONSET DATE:   SUBJECTIVE:        SUBJECTIVE STATEMENT TODAY:  Since last visit 10/14/23, Pt said she was able to walk better and balance was better after the Tx with the shoe lift in the L shoe.  On 10/23/23, pt got a salad from [employer] and ate the salad. All of the sudden her stomach started hurting really bad , she was vomiting and diarrhea.    Next thing she know , she noticed something cold on her face and she realized she had been flat on her face.  Pt looked in the mirror and saw a gash on her forehead. That day , she was not doing anything that day. Pt called her PCP the next day and talked to a RN who referred her to the walk in clinic which then referred her to the ER. Pt' S ER visit included getting IV fluids because she was very dehydrated and low potassium and she was told she had a concussion.  Pt has to get an EEG next month.   Currently, pt is still feeling low energy. Pt is so constipated and making her stomach really bad. "I am not feeling myself. I can't do nothing for myself since that last fall."  This is a second time, she experienced food poisoning and then passed out with the first time in Jan/ Feb when she passed out twice.                                                                                                                                                      SUBJECTIVE STATEMENT ON EVAL 10/07/23 : 1) constant  pelvic pain:  Pt felt pain inside the vagina for the past 2 years. Gynecologist provided her cream to put inside. Pt applies it every other day and it relieves it by 1/10 but pain is still constant. Aaron Aas "Excruciatingly and "like a mm spasm" "  6/10     Pt had pain during pelvic exam.  Denied burning with urinating / bowel movement. Denied noticing any bulge.  Pt is able to start and complete urination without difficulty.    2)  neck pain:  8/10  constant. Pt is planning to get pain medication after today's visit   3)  repeated falls: see ( pertinent Hx below)   4) L hip pain: started out of  nowhere. One day she was sweeping. Pain feels "something is lying top of it) . Denied radiating pain. Worse with sweeping, bending.  7/10    PERTINENT HISTORY:               Frequent Falls: Has patient fallen in last 6 months? 3   (6 months ago due to food poisoning for fast food:  pt hit the floor and was unconscious and had a "big old knot on her forehead")   ( Jan/Feb 1.5 months ago: 2 falls in a row due to  food poisioning from fast food  and fell with injury to head to the floor, she fell unconscious and she woke on her own. She got up and felt sick again, got up on her stool, and then woke up again on the floor. PCP know about it)   Dizzy spells came on around 1.5 month after her falls that occurred back to back  ( see below under Falls Hx). Pt gets the dizziness once she is up and standing but not with turning her head. Pt felt dizziness 3 different times.  "I feel woozy".  Pt drives but she had not been driving when she feels dizzy.  Yesterday, pt was getting up from the toilet and she felt dizzy and she had to lean on both sides against the walls to not fall.  Pt sits down for 10-15 min and she feels better.   Blood in her urine for  3-4 years ago and"they did not give me anything to clear it up."  Urine is cloudy. Pt drinks water  4-5 (8 z bottles) per day. Pt has blood in her urine with every trip to the toilet. "It is like a light orange".  Denied blood in her underwear.   Pt eats only one  full meal once a day. Pt said during session today, "I feel dehydrated". Water  was provided to pt.  Pt started to eat one meal day for the past 3 years because she heard to eat only one meal to not gain weight.   Chest pain/ mm spasms: for a 5 years. Pt has been on medication .  Pt had mm spasms on her L side. Pt went to the [facility] a week ago with mm spasms and  excruciating pain. Pt went through test which meg. Pt was prescribed medications for the mm spasms but she plans to pick them up today.    SOB: started a  month ago. Walking to the mailbox and coming back.     Pt is scheduled for CT scan after today's visit.   Medical Hx:  C section Abdominal hysterectomy Knee arthroscopy  L  Rotator cuff repair  B   Neck surgery  Shoulder arthroscopy with distal clavicle resection    Xi robotic assisted oophorectomy    PAIN: Are you having pain? Yes: see above   PRECAUTIONS: No   WEIGHT BEARING RESTRICTIONS: No  FALLS:  Has patient fallen in last 6 months? 3   (6 months ago due to food poisoning for fast food:  pt hit the floor and was unconscious and had a "big old knot on her forehead")   ( Jan/Feb 1.5 months ago: 2 falls in a row due to  food poisioning from fast food  and  fell with injury to head to the floor, she fell unconscious and she woke on her own. She got up and felt sick again, got up on her stool, and then woke up again on the floor. PCP know about it)  Pt is scheduled for CT scan after today's visit.   LIVING ENVIRONMENT: Lives with: alone but her son comes and goes weekly  Lives in:  house Stairs: 4 STE in front, 5 back door, with rail, one story house  Has following equipment at home: no  OCCUPATION: disabled    PLOF: pt 's husband died 6 months ago.   PATIENT GOALS:     OBJECTIVE:   [facility] PT Assessment - 11/07/23 1711       Palpation   SI assessment  R shoulder, iliac crest higher, L base of patella higher in standing  ( pt did not wear shoe lifts in her new tennis shoes today)    Palpation comment flexed coccyx, hypomobile L SIJ             [facility] Adult PT Treatment/Exercise - 11/07/23 1709       Self-Care   Other Self-Care Comments  provided shoe lift in toe box , heel  in L  shoe in her new tennis shoes, active listening to medical updates with another fall after syncope 2/2 food poisoning and medical f/u , recommended concussion rehab and plan to communicate with PCP      Neuro Re-ed    Neuro Re-ed Details  cued for pelvic tilts  for improved bowel movements and coccyx alignment      Modalities   Modalities Moist Heat      Moist Heat Therapy   Number Minutes Moist Heat 5 Minutes    Moist Heat Location --   sacrum     Manual Therapy   Manual therapy comments Superior glide lateral to coccyx, PA mob Grade III at B SIJ to promote ext of coccyx and nutation of sacrum ,               HOME EXERCISE PROGRAM: See pt instruction section    ASSESSMENT:  CLINICAL IMPRESSION:    Shoe lift was provided again today in toe box and heel in L shoe which levelled pelvis and shoulder. Pt had bought new tennis shoes but did not have shift lift in shoe today. Pt reported she felt  she was able to walk better and balance was better after  last  Tx with the shoe lift in the L shoe.   Provided active listening to medical updates with another fall after syncope 2/2 food poisoning and medical f/u , recommended concussion rehab and plan to communicate with PCP. Plan to stay updated with her medical f/u from her fall and withhold from Pelvic PT if concussion rehab needs to take priority .   Pt reported since the fall, she had been constipated. Assessment showed flexed coccyx which was addressed with manual Tx externally through clothing. Following Tx, pt showed more extended coccyx and nutated sacrum with report of relief.  Anticipate today's improvements will help with improved bowel elimination function. Cued for pelvic tilts for HEP.   Regional interdependent approaches will yield greater benefits in pt's POC.      Anticipate today's improvements will help to promote optimize IAP system for improved pelvic floor function, trunk stability, gait, balance, stabilization with mobility tasks.  Plan to address pelvic floor issues once pelvis and spine are realigned to yield better outcomes.  Pt benefits from skilled PT.       OBJECTIVE IMPAIRMENTS decreased activity tolerance, decreased coordination, decreased endurance,  decreased mobility, difficulty walking, decreased ROM, decreased strength, decreased safety awareness, hypomobility, increased muscle spasms, impaired flexibility, improper body mechanics, postural dysfunction, and pain. scar restrictions   ACTIVITY LIMITATIONS  self-care, , home chores, work tasks    PARTICIPATION LIMITATIONS:  community  PERSONAL FACTORS  See pt additional medical Sx are also affecting patient's functional outcome.  Pt need medical work up   [facility] POTENTIAL: Good   CLINICAL DECISION MAKING: Evolving/moderate complexity   EVALUATION COMPLEXITY: Moderate    PATIENT EDUCATION:    Education details: Showed pt anatomy images. Explained muscles attachments/ connection, physiology of deep core system/ spinal- thoracic-pelvis-lower kinetic chain as they relate to pt's presentation, Sx, and past Hx. Explained what and how these areas of deficits need to be restored to balance and function    See Therapeutic activity / neuromuscular re-education section  Answered pt's questions.   Person educated: Patient Education method: Explanation, Demonstration, Tactile cues, Verbal cues, and Handouts Education comprehension: verbalized understanding, returned demonstration, verbal cues required, tactile cues required, and needs further education     PLAN: PT FREQUENCY: 1x/week   PT DURATION: 10 weeks   PLANNED INTERVENTIONS: Therapeutic exercises, Therapeutic activity, Neuromuscular re-education, Balance training, Gait training, Patient/Family education, Self Care, Joint mobilization, Spinal mobilization, Moist heat, Taping, and Manual therapy, dry needling.   PLAN FOR NEXT SESSION: See clinical impression for plan     GOALS: Goals reviewed with patient? Yes  SHORT TERM GOALS: Target date: 11/04/2023    Pt will demo IND with HEP                    Baseline: Not IND            Goal status: INITIAL   LONG TERM GOALS: Target date: 12/16/2023    1.Pt will demo proper  deep core coordination without chest breathing and optimal excursion of diaphragm/pelvic floor in order to promote spinal stability and pelvic floor function  Baseline: dyscoordination Goal status: INITIAL  2.  Pt will demo proper body mechanics in against gravity tasks and ADLs  work tasks, fitness  to minimize straining pelvic floor / back    Baseline: not IND, improper form that places strain on pelvic floor  Goal status: INITIAL    3. Pt will demo increased gait speed > 1.3 m/s with reciprocal gait pattern, longer stride length  in order to ambulate safely in community and return to fitness routine  Baseline:  1.06 m/s, R hip hike,  Goal status: INITIAL    4. Pt will demo levelled pelvic girdle and shoulder height in order to progress to deep core strengthening HEP and restore mobility at spine, pelvis, gait, posture minimize falls, and improve balance   Baseline: R shoulder and iliac crest higher than L  Goal status: INITIAL   5. Pt will be referred to gait training, falls prevention training PT to minimize falls  Baseline: frequent falls  Goal status: INITIAL      Modesto Andreas, PT 11/07/2023, 1:37 PM

## 2023-11-11 ENCOUNTER — Ambulatory Visit: Payer: 59 | Admitting: Physical Therapy

## 2023-11-11 DIAGNOSIS — R102 Pelvic and perineal pain: Secondary | ICD-10-CM

## 2023-11-11 DIAGNOSIS — M533 Sacrococcygeal disorders, not elsewhere classified: Secondary | ICD-10-CM

## 2023-11-11 DIAGNOSIS — R296 Repeated falls: Secondary | ICD-10-CM

## 2023-11-11 DIAGNOSIS — M542 Cervicalgia: Secondary | ICD-10-CM

## 2023-11-11 NOTE — Therapy (Addendum)
 OUTPATIENT PHYSICAL THERAPY TREATMENT   Patient Name: ETTER ROYALL MRN: [id number] DOB:08-24-1960, 63 y.o., female Today's Date: 11/11/2023   PT End of Session - 11/11/23 1109     Visit Number 4    Number of Visits 10    Date for PT Re-Evaluation 12/16/23    PT Start Time 1105    PT Stop Time 1145    PT Time Calculation (min) 40 min    Activity Tolerance Patient tolerated treatment well;No increased pain    Behavior During Therapy WFL for tasks assessed/performed             Past Medical History:  Diagnosis Date   Anxiety    takes alprazolam  - rare use    Arthritis    cervical spondylosis    Asthma    Balance problem    Brain cyst    Carpal tunnel syndrome, bilateral    Cervical fusion syndrome    Complete rupture of rotator cuff    Complication of anesthesia    lung collapsed after neck fusion   Degenerative disc disease, cervical    Depression    Difficult intubation    Small mouth opening, limited neck flexion, very anterior    Flu 08/10/2018   tested positive   GERD (gastroesophageal reflux disease)    pt. reports that its better, no meds in use at this time- 2015   Headache    Herpes    History of kidney stones    History of pneumonia    Hyperlipidemia    Hypertension    pt. doesn't see cardiologist, followed for HTN by Dr. Lewayne Records   Hypothyroidism    Inflammation of shoulder joint    Memory difficulties    Neuromuscular disorder (HCC)    joint and muscle problems   Occipital neuralgia    Pneumothorax on right    following C4-6 ACDF 07/04/16   Pseudoarthrosis of cervical spine (HCC)    Recurrent falls    Syncope    Urinary frequency    Past Surgical History:  Procedure Laterality Date   ABDOMINAL HYSTERECTOMY     ANTERIOR CERVICAL DECOMP/DISCECTOMY FUSION N/A 08/06/2014   Procedure: ANTERIOR CERVICAL DECOMPRESSION/DISCECTOMY FUSION CERVICAL THREE-FOUR,CERVICAL SIX-SEVEN ,CERVICAL SEVEN-THORACIC ONE;  Surgeon: Adelbert Adler, MD;  Location:  [facility] OR;  Service: Neurosurgery;  Laterality: N/A;   ANTERIOR CERVICAL DECOMP/DISCECTOMY FUSION N/A 07/04/2016   Procedure: CERVICAL FOUR-FIVE, CERVICAL FIVE-SIX ANTERIOR CERVICAL DECOMPRESSION/DISCECTOMY/FUSION WITH REVISION OF CERVICAL THREE-FOUR PLATE;  Surgeon: Claudetta Cuba, MD;  Location: [facility] OR;  Service: Neurosurgery;  Laterality: N/A;   CARPAL TUNNEL RELEASE Left 02/16/2013   Procedure: CARPAL TUNNEL RELEASE;  Surgeon: Darrin Emerald, MD;  Location: [facility];  Service: Orthopedics;  Laterality: Left;   CARPAL TUNNEL RELEASE Right 03/27/2013   Procedure: RIGHT CARPAL TUNNEL RELEASE;  Surgeon: Darrin Emerald, MD;  Location: [facility];  Service: Orthopedics;  Laterality: Right;   CESAREAN SECTION     x2   CHOLECYSTECTOMY N/A 06/17/2015   Procedure: LAPAROSCOPIC CHOLECYSTECTOMY;  Surgeon: Alanda Allegra, MD;  Location: [facility];  Service: General;  Laterality: N/A;   COLONOSCOPY     COLONOSCOPY WITH PROPOFOL  N/A 05/06/2019   Procedure: COLONOSCOPY WITH PROPOFOL ;  Surgeon: Marshall Skeeter, MD;  Location: [facility];  Service: Endoscopy;  Laterality: N/A;   CYSTO WITH HYDRODISTENSION N/A 09/26/2021   Procedure: CYSTOSCOPY/HYDRODISTENSION;  Surgeon: Geraline Knapp, MD;  Location: [facility]S;  Service: Urology;  Laterality: N/A;   CYSTOSCOPY W/ RETROGRADES Bilateral 09/26/2021  Procedure: CYSTOSCOPY WITH RETROGRADE PYELOGRAM;  Surgeon: Geraline Knapp, MD;  Location: [facility]S;  Service: Urology;  Laterality: Bilateral;   CYSTOSCOPY WITH BIOPSY N/A 09/26/2021   Procedure: CYSTOSCOPY WITH BIOPSY;  Surgeon: Geraline Knapp, MD;  Location: [facility];  Service: Urology;  Laterality: N/A;   ESOPHAGOGASTRODUODENOSCOPY N/A 05/08/2021   Procedure: ESOPHAGOGASTRODUODENOSCOPY (EGD);  Surgeon: Shane Darling, MD;  Location: [facility];  Service: Endoscopy;  Laterality: N/A;   ESOPHAGOGASTRODUODENOSCOPY  11/25/2017   EXCISION VAGINAL CYST N/A 12/22/2021   Procedure: EXCISION VULVAR CYST;   Surgeon: Prescilla Brod, MD;  Location: [facility]S;  Service: Gynecology;  Laterality: N/A;   EXTRACORPOREAL SHOCK WAVE LITHOTRIPSY Left 09/04/2018   Procedure: EXTRACORPOREAL SHOCK WAVE LITHOTRIPSY (ESWL);  Surgeon: Lawerence Pressman, MD;  Location: [facility];  Service: Urology;  Laterality: Left;   FOREIGN BODY REMOVAL Left    knee-as child   KNEE ARTHROSCOPY     NASAL SINUS SURGERY N/A 04/13/2015   Procedure: nasal endoscopy with adenoid biopsy;  Surgeon: Littie Rife, MD;  Location: [facility] OR;  Service: ENT;  Laterality: N/A;   NECK SURGERY  2016   x 3 all together   POSTERIOR CERVICAL FUSION/FORAMINOTOMY N/A 11/13/2016   Procedure: CERVICAL TWO-CERVICAL SIX POSTERIOR CERVICAL FUSION WITH LATERAL MASS FIXATION;  Surgeon: Elna Haggis, MD;  Location: [facility] OR;  Service: Neurosurgery;  Laterality: N/A;  posterior approach   REDUCTION MAMMAPLASTY  10/2019   ROTATOR CUFF REPAIR Bilateral 2014   SHOULDER ACROMIOPLASTY Left 05/18/2015   Procedure: SHOULDER ACROMIOPLASTY;  Surgeon: Marlena Sima, MD;  Location: [facility];  Service: Orthopedics;  Laterality: Left;   SHOULDER ARTHROSCOPY WITH DISTAL CLAVICLE RESECTION Left 05/18/2015   Procedure: LEFT SHOULDER ARTHROSCOPY WITH  DISTAL CLAVICLE RESECTION;  Surgeon: Marlena Sima, MD;  Location: [facility];  Service: Orthopedics;  Laterality: Left;   URETEROSCOPY Bilateral 09/26/2021   Procedure: URETEROSCOPY;  Surgeon: Geraline Knapp, MD;  Location: [facility];  Service: Urology;  Laterality: Bilateral;   XI ROBOTIC ASSISTED OOPHORECTOMY N/A 12/22/2021   Procedure: XI ROBOTIC ASSISTED DIAGNOSTIC LAPAROSCOPY, LEFT OOPHORECTOMY, WITH RIGHT SALPINGECTOMY AND LYSIS OF ADHESIONS;  Surgeon: Prescilla Brod, MD;  Location: [facility];  Service: Gynecology;  Laterality: N/A;   Patient Active Problem List   Diagnosis Date Noted   Retroperitoneal lymphadenopathy 08/13/2021   Left lower quadrant abdominal pain 08/11/2021   Occipital  headache 12/29/2019   Cervical myofascial pain syndrome 09/22/2019   Recurrent herpes simplex 07/16/2018   Pelvic pain in female 05/29/2018   Chronic pain syndrome 04/17/2018   Cervicalgia 04/17/2018   Controlled substance agreement signed 04/17/2018   Cervical fusion syndrome 03/24/2018   Hx of cervical spine surgery 03/24/2018   Depression, major, recurrent, mild (HCC) 11/21/2017   Poorly-controlled hypertension 11/21/2017   Depression, major, single episode, complete remission (HCC) 10/24/2017   Degenerative disc disease, cervical 08/28/2017   Vasovagal syncope 08/28/2017   Dizziness 08/22/2017   History of recent fall 08/22/2017   Hypothyroidism (acquired) 08/13/2017   Loss of memory 04/30/2017   Reaction to severe stress 02/14/2017   Pseudoarthrosis of cervical spine (HCC) 11/13/2016   Asthma 07/05/2016   Acute chest wall pain 07/05/2016   GERD (gastroesophageal reflux disease) 07/05/2016   Spontaneous pneumothorax    Medication overuse headache 04/19/2016   Chronic tension-type headache, not intractable 04/19/2016   Numbness and tingling 04/19/2016   Dysphagia 03/07/2016   Vitamin D deficiency 02/07/2016   Bilateral hand pain 02/06/2016   Cervical stenosis of  spinal canal 08/06/2014   Muscle weakness (generalized) 11/12/2013   Tight fascia 11/12/2013   Decreased range of motion of shoulder 11/12/2013   Cervical spondylosis without myelopathy 10/29/2013   S/P carpal tunnel release 02/19/2013   CTS (carpal tunnel syndrome) 02/19/2013   SHOULDER PAIN 10/06/2007   IMPINGEMENT SYNDROME 10/06/2007   RUPTURE ROTATOR CUFF 10/06/2007   HTN (hypertension) 10/03/2007    PCP: Jerone Moorman   REFERRING PROVIDER: Tish Forge DIAG: Pelvic pain Syndrome    Rationale for Evaluation and Treatment Rehabilitation  THERAPY DIAG: Pelvic pain   Repeated falls   Neck pain   Sacrococcygeal disorders, not elsewhere classified   ONSET DATE:   SUBJECTIVE:        SUBJECTIVE STATEMENT TODAY:  Pt reported she was able to have bowel movements right after last session. Pt has had 2 bowel movements per day which is her normal.   Pt has not had any dizzy spells nor falls last week        Pt is bothered by her neck pain 7/ 10 pain . Pt had neck surgeries in the past 2 on the L side and one surgery at the back of her head.                                                                                                 SUBJECTIVE STATEMENT ON EVAL 10/07/23 : 1) constant  pelvic pain:  Pt felt pain inside the vagina for the past 2 years. Gynecologist provided her cream to put inside. Pt applies it every other day and it relieves it by 1/10 but pain is still constant. Aaron Aas "Excruciatingly and "like a mm spasm" "  6/10     Pt had pain during pelvic exam.  Denied burning with urinating / bowel movement. Denied noticing any bulge.  Pt is able to start and complete urination without difficulty.    2)  neck pain:  8/10  constant. Pt is planning to get pain medication after today's visit   3)  repeated falls: see ( pertinent Hx below)   4) L hip pain: started out of nowhere. One day she was sweeping. Pain feels "something is lying top of it) . Denied radiating pain. Worse with sweeping, bending.  7/10    PERTINENT HISTORY:               Frequent Falls: Has patient fallen in last 6 months? 3   (6 months ago due to food poisoning for fast food:  pt hit the floor and was unconscious and had a "big old knot on her forehead")   ( Jan/Feb 1.5 months ago: 2 falls in a row due to  food poisioning from fast food  and fell with injury to head to the floor, she fell unconscious and she woke on her own. She got up and felt sick again, got up on her stool, and then woke up again on the floor. PCP know about it)   Dizzy spells came on around 1.5 month after her falls that occurred back to back  ( see below under  Falls Hx). Pt gets the dizziness once she is up and standing but not with  turning her head. Pt felt dizziness 3 different times.  "I feel woozy".  Pt drives but she had not been driving when she feels dizzy.  Yesterday, pt was getting up from the toilet and she felt dizzy and she had to lean on both sides against the walls to not fall.  Pt sits down for 10-15 min and she feels better.   Blood in her urine for  3-4 years ago and"they did not give me anything to clear it up."  Urine is cloudy. Pt drinks water  4-5 (8 z bottles) per day. Pt has blood in her urine with every trip to the toilet. "It is like a light orange".  Denied blood in her underwear.   Pt eats only one  full meal once a day. Pt said during session today, "I feel dehydrated". Water  was provided to pt.  Pt started to eat one meal day for the past 3 years because she heard to eat only one meal to not gain weight.   Chest pain/ mm spasms: for a 5 years. Pt has been on medication .  Pt had mm spasms on her L side. Pt went to the [facility] a week ago with mm spasms and  excruciating pain. Pt went through test which meg. Pt was prescribed medications for the mm spasms but she plans to pick them up today.   SOB: started a  month ago. Walking to the mailbox and coming back.     Pt is scheduled for CT scan after today's visit.   Medical Hx:  C section Abdominal hysterectomy Knee arthroscopy  L  Rotator cuff repair  B   Neck surgery  Shoulder arthroscopy with distal clavicle resection    Xi robotic assisted oophorectomy    PAIN: Are you having pain? Yes: see above   PRECAUTIONS: No   WEIGHT BEARING RESTRICTIONS: No  FALLS:  Has patient fallen in last 6 months? 3   (6 months ago due to food poisoning for fast food:  pt hit the floor and was unconscious and had a "big old knot on her forehead")   ( Jan/Feb 1.5 months ago: 2 falls in a row due to  food poisioning from fast food  and fell with injury to head to the floor, she fell unconscious and she woke on her own. She got up and felt sick  again, got up on her stool, and then woke up again on the floor. PCP know about it)  Pt is scheduled for CT scan after today's visit.   LIVING ENVIRONMENT: Lives with: alone but her son comes and goes weekly  Lives in:  house Stairs: 4 STE in front, 5 back door, with rail, one story house  Has following equipment at home: no  OCCUPATION: disabled    PLOF: pt 's husband died 6 months ago.   PATIENT GOALS:     OBJECTIVE:   .  [facility] PT Assessment - 11/11/23 1110       AROM   Overall AROM Comments seated :   cervical B sideflexion 30 deg before pain,    flexion 30 deg,   L rotation 30 deg, R 20 deg ( post Tx: 40 deg B )   ext  25 deg ( post Tx: 30 deg)        Palpation   SI assessment  levelled pelvis, L shoulder lowered  Palpation comment coccyx no longer flexed, restricted scar posterior neck,  tightenss at occipital mm,             [facility] Adult PT Treatment/Exercise - 11/11/23 1149       Neuro Re-ed    Neuro Re-ed Details  cued for neck ROM HEP , cued for logrolling and slowing down to minimize dizziness in position changes      Modalities   Modalities Moist Heat      Moist Heat Therapy   Moist Heat Location --  Neck  ( unbilled 5 min)     Manual Therapy   Manual therapy comments distraction at occipit mm, fascial mobilization at neck scar  , MWM to retore neck ROM               HOME EXERCISE PROGRAM: See pt instruction section    ASSESSMENT:  CLINICAL IMPRESSION:  Improvements:    Shoe lift  in toe box and heel in L shoe levelled pelvis Tailbone no longer flexed and pt reported she was able to have bowel movements right after last session. Pt has had 2 bowel movements per day which is her normal.    Pt has not had any dizzy spells nor falls last week      Pt had positive response to lst session's Tx to coccyx as pt reported no more constipation issues since Tx. Today pt showed good carry over with no more flexed coccyx.   Addressed  pt's c/o neck pain with  manual Tx after which  pt reported decreased neck pain from 7/10 to 2/10 post Tx. Posterior neck scar decreased in scar restrictions and extension and rotation ROM increased post Tx.  Plan to continue to address neck limitations which will also help with upright posture and IAP system for minimizing not just neck pain but also  fall risks and pelvic pain.     Regional interdependent approaches will yield greater benefits in pt's POC.      Anticipate today's improvements will help to promote optimize IAP system for improved pelvic floor function, trunk stability, gait, balance, stabilization with mobility tasks.  Plan to address pelvic floor issues once pelvis and spine are realigned to yield better outcomes.     Pt benefits from skilled PT.       OBJECTIVE IMPAIRMENTS decreased activity tolerance, decreased coordination, decreased endurance, decreased mobility, difficulty walking, decreased ROM, decreased strength, decreased safety awareness, hypomobility, increased muscle spasms, impaired flexibility, improper body mechanics, postural dysfunction, and pain. scar restrictions   ACTIVITY LIMITATIONS  self-care, , home chores, work tasks    PARTICIPATION LIMITATIONS:  community  PERSONAL FACTORS  See pt additional medical Sx are also affecting patient's functional outcome.  Pt need medical work up   [facility] POTENTIAL: Good   CLINICAL DECISION MAKING: Evolving/moderate complexity   EVALUATION COMPLEXITY: Moderate    PATIENT EDUCATION:    Education details: Showed pt anatomy images. Explained muscles attachments/ connection, physiology of deep core system/ spinal- thoracic-pelvis-lower kinetic chain as they relate to pt's presentation, Sx, and past Hx. Explained what and how these areas of deficits need to be restored to balance and function    See Therapeutic activity / neuromuscular re-education section  Answered pt's questions.   Person educated:  Patient Education method: Explanation, Demonstration, Tactile cues, Verbal cues, and Handouts Education comprehension: verbalized understanding, returned demonstration, verbal cues required, tactile cues required, and needs further education     PLAN: PT FREQUENCY: 1x/week   PT DURATION:  10 weeks   PLANNED INTERVENTIONS: Therapeutic exercises, Therapeutic activity, Neuromuscular re-education, Balance training, Gait training, Patient/Family education, Self Care, Joint mobilization, Spinal mobilization, Moist heat, Taping, and Manual therapy, dry needling.   PLAN FOR NEXT SESSION: See clinical impression for plan     GOALS: Goals reviewed with patient? Yes  SHORT TERM GOALS: Target date: 11/04/2023    Pt will demo IND with HEP                    Baseline: Not IND            Goal status: INITIAL   LONG TERM GOALS: Target date: 12/16/2023    1.Pt will demo proper deep core coordination without chest breathing and optimal excursion of diaphragm/pelvic floor in order to promote spinal stability and pelvic floor function  Baseline: dyscoordination Goal status: INITIAL  2.  Pt will demo proper body mechanics in against gravity tasks and ADLs  work tasks, fitness  to minimize straining pelvic floor / back    Baseline: not IND, improper form that places strain on pelvic floor  Goal status: INITIAL    3. Pt will demo increased gait speed > 1.3 m/s with reciprocal gait pattern, longer stride length  in order to ambulate safely in community and return to fitness routine  Baseline:  1.06 m/s, R hip hike,  Goal status: INITIAL    4. Pt will demo levelled pelvic girdle and shoulder height in order to progress to deep core strengthening HEP and restore mobility at spine, pelvis, gait, posture minimize falls, and improve balance   Baseline: R shoulder and iliac crest higher than L  Goal status: INITIAL   5. Pt will be referred to gait training, falls prevention training PT to minimize  falls  Baseline: frequent falls  Goal status: INITIAL      Modesto Andreas, PT 11/11/2023, 11:10 AM

## 2023-11-11 NOTE — Patient Instructions (Signed)
 Stretches :   Neck / shoulder stretches:    Lying on back - small sushi roll towel under neck  _ 6 directions   Chin up, down  Rotation like changing lanes when driving  Ear to shoulder like puppy dog   10 reps    _angel wings, lower elbows down , keep arms touching bed  10 reps

## 2023-11-18 ENCOUNTER — Ambulatory Visit: Payer: 59 | Admitting: Physical Therapy

## 2023-11-20 ENCOUNTER — Ambulatory Visit: Payer: 59 | Admitting: Physical Therapy

## 2023-11-20 DIAGNOSIS — R102 Pelvic and perineal pain unspecified side: Secondary | ICD-10-CM

## 2023-11-20 DIAGNOSIS — M542 Cervicalgia: Secondary | ICD-10-CM

## 2023-11-20 DIAGNOSIS — M533 Sacrococcygeal disorders, not elsewhere classified: Secondary | ICD-10-CM

## 2023-11-20 DIAGNOSIS — M5481 Occipital neuralgia: Secondary | ICD-10-CM | POA: Insufficient documentation

## 2023-11-20 DIAGNOSIS — R296 Repeated falls: Secondary | ICD-10-CM

## 2023-11-20 NOTE — Therapy (Signed)
 OUTPATIENT PHYSICAL THERAPY TREATMENT   Patient Name: LAIKLYN PILKENTON MRN: [id number] DOB:Oct 18, 1960, 63 y.o., female Today's Date: 11/20/2023   PT End of Session - 11/20/23 1106     Visit Number 5    Number of Visits 10    Date for PT Re-Evaluation 12/16/23    PT Start Time 1103    PT Stop Time 1145    PT Time Calculation (min) 42 min    Activity Tolerance Patient tolerated treatment well;No increased pain    Behavior During Therapy WFL for tasks assessed/performed             Past Medical History:  Diagnosis Date   Anxiety    takes alprazolam  - rare use    Arthritis    cervical spondylosis    Asthma    Balance problem    Brain cyst    Carpal tunnel syndrome, bilateral    Cervical fusion syndrome    Complete rupture of rotator cuff    Complication of anesthesia    lung collapsed after neck fusion   Degenerative disc disease, cervical    Depression    Difficult intubation    Small mouth opening, limited neck flexion, very anterior    Flu 08/10/2018   tested positive   GERD (gastroesophageal reflux disease)    pt. reports that its better, no meds in use at this time- 2015   Headache    Herpes    History of kidney stones    History of pneumonia    Hyperlipidemia    Hypertension    pt. doesn't see cardiologist, followed for HTN by Dr. Lewayne Records   Hypothyroidism    Inflammation of shoulder joint    Memory difficulties    Neuromuscular disorder (HCC)    joint and muscle problems   Occipital neuralgia    Pneumothorax on right    following C4-6 ACDF 07/04/16   Pseudoarthrosis of cervical spine (HCC)    Recurrent falls    Syncope    Urinary frequency    Past Surgical History:  Procedure Laterality Date   ABDOMINAL HYSTERECTOMY     ANTERIOR CERVICAL DECOMP/DISCECTOMY FUSION N/A 08/06/2014   Procedure: ANTERIOR CERVICAL DECOMPRESSION/DISCECTOMY FUSION CERVICAL THREE-FOUR,CERVICAL SIX-SEVEN ,CERVICAL SEVEN-THORACIC ONE;  Surgeon: Adelbert Adler, MD;  Location:  [facility];  Service: Neurosurgery;  Laterality: N/A;   ANTERIOR CERVICAL DECOMP/DISCECTOMY FUSION N/A 07/04/2016   Procedure: CERVICAL FOUR-FIVE, CERVICAL FIVE-SIX ANTERIOR CERVICAL DECOMPRESSION/DISCECTOMY/FUSION WITH REVISION OF CERVICAL THREE-FOUR PLATE;  Surgeon: Claudetta Cuba, MD;  Location: [facility] OR;  Service: Neurosurgery;  Laterality: N/A;   CARPAL TUNNEL RELEASE Left 02/16/2013   Procedure: CARPAL TUNNEL RELEASE;  Surgeon: Darrin Emerald, MD;  Location: [facility];  Service: Orthopedics;  Laterality: Left;   CARPAL TUNNEL RELEASE Right 03/27/2013   Procedure: RIGHT CARPAL TUNNEL RELEASE;  Surgeon: Darrin Emerald, MD;  Location: [facility];  Service: Orthopedics;  Laterality: Right;   CESAREAN SECTION     x2   CHOLECYSTECTOMY N/A 06/17/2015   Procedure: LAPAROSCOPIC CHOLECYSTECTOMY;  Surgeon: Alanda Allegra, MD;  Location: [facility];  Service: General;  Laterality: N/A;   COLONOSCOPY     COLONOSCOPY WITH PROPOFOL  N/A 05/06/2019   Procedure: COLONOSCOPY WITH PROPOFOL ;  Surgeon: Marshall Skeeter, MD;  Location: [facility];  Service: Endoscopy;  Laterality: N/A;   CYSTO WITH HYDRODISTENSION N/A 09/26/2021   Procedure: CYSTOSCOPY/HYDRODISTENSION;  Surgeon: Geraline Knapp, MD;  Location: [facility];  Service: Urology;  Laterality: N/A;   CYSTOSCOPY W/ RETROGRADES Bilateral 09/26/2021  Procedure: CYSTOSCOPY WITH RETROGRADE PYELOGRAM;  Surgeon: Geraline Knapp, MD;  Location: [facility]S;  Service: Urology;  Laterality: Bilateral;   CYSTOSCOPY WITH BIOPSY N/A 09/26/2021   Procedure: CYSTOSCOPY WITH BIOPSY;  Surgeon: Geraline Knapp, MD;  Location: [facility];  Service: Urology;  Laterality: N/A;   ESOPHAGOGASTRODUODENOSCOPY N/A 05/08/2021   Procedure: ESOPHAGOGASTRODUODENOSCOPY (EGD);  Surgeon: Shane Darling, MD;  Location: [facility] END[facility];  Service: Endoscopy;  Laterality: N/A;   ESOPHAGOGASTRODUODENOSCOPY  11/25/2017   EXCISION VAGINAL CYST N/A 12/22/2021   Procedure: EXCISION VULVAR CYST;   Surgeon: Prescilla Brod, MD;  Location: [facility];  Service: Gynecology;  Laterality: N/A;   EXTRACORPOREAL SHOCK WAVE LITHOTRIPSY Left 09/04/2018   Procedure: EXTRACORPOREAL SHOCK WAVE LITHOTRIPSY (ESWL);  Surgeon: Lawerence Pressman, MD;  Location: [facility];  Service: Urology;  Laterality: Left;   FOREIGN BODY REMOVAL Left    knee-as child   KNEE ARTHROSCOPY     NASAL SINUS SURGERY N/A 04/13/2015   Procedure: nasal endoscopy with adenoid biopsy;  Surgeon: Littie Rife, MD;  Location: [facility] OR;  Service: ENT;  Laterality: N/A;   NECK SURGERY  2016   x 3 all together   POSTERIOR CERVICAL FUSION/FORAMINOTOMY N/A 11/13/2016   Procedure: CERVICAL TWO-CERVICAL SIX POSTERIOR CERVICAL FUSION WITH LATERAL MASS FIXATION;  Surgeon: Elna Haggis, MD;  Location: [facility] OR;  Service: Neurosurgery;  Laterality: N/A;  posterior approach   REDUCTION MAMMAPLASTY  10/2019   ROTATOR CUFF REPAIR Bilateral 2014   SHOULDER ACROMIOPLASTY Left 05/18/2015   Procedure: SHOULDER ACROMIOPLASTY;  Surgeon: Marlena Sima, MD;  Location: [facility];  Service: Orthopedics;  Laterality: Left;   SHOULDER ARTHROSCOPY WITH DISTAL CLAVICLE RESECTION Left 05/18/2015   Procedure: LEFT SHOULDER ARTHROSCOPY WITH  DISTAL CLAVICLE RESECTION;  Surgeon: Marlena Sima, MD;  Location: [facility];  Service: Orthopedics;  Laterality: Left;   URETEROSCOPY Bilateral 09/26/2021   Procedure: URETEROSCOPY;  Surgeon: Geraline Knapp, MD;  Location: [facility];  Service: Urology;  Laterality: Bilateral;   XI ROBOTIC ASSISTED OOPHORECTOMY N/A 12/22/2021   Procedure: XI ROBOTIC ASSISTED DIAGNOSTIC LAPAROSCOPY, LEFT OOPHORECTOMY, WITH RIGHT SALPINGECTOMY AND LYSIS OF ADHESIONS;  Surgeon: Prescilla Brod, MD;  Location: [facility];  Service: Gynecology;  Laterality: N/A;   Patient Active Problem List   Diagnosis Date Noted   Retroperitoneal lymphadenopathy 08/13/2021   Left lower quadrant abdominal pain 08/11/2021   Occipital  headache 12/29/2019   Cervical myofascial pain syndrome 09/22/2019   Recurrent herpes simplex 07/16/2018   Pelvic pain in female 05/29/2018   Chronic pain syndrome 04/17/2018   Cervicalgia 04/17/2018   Controlled substance agreement signed 04/17/2018   Cervical fusion syndrome 03/24/2018   Hx of cervical spine surgery 03/24/2018   Depression, major, recurrent, mild (HCC) 11/21/2017   Poorly-controlled hypertension 11/21/2017   Depression, major, single episode, complete remission (HCC) 10/24/2017   Degenerative disc disease, cervical 08/28/2017   Vasovagal syncope 08/28/2017   Dizziness 08/22/2017   History of recent fall 08/22/2017   Hypothyroidism (acquired) 08/13/2017   Loss of memory 04/30/2017   Reaction to severe stress 02/14/2017   Pseudoarthrosis of cervical spine (HCC) 11/13/2016   Asthma 07/05/2016   Acute chest wall pain 07/05/2016   GERD (gastroesophageal reflux disease) 07/05/2016   Spontaneous pneumothorax    Medication overuse headache 04/19/2016   Chronic tension-type headache, not intractable 04/19/2016   Numbness and tingling 04/19/2016   Dysphagia 03/07/2016   Vitamin D deficiency 02/07/2016   Bilateral hand pain 02/06/2016   Cervical stenosis of  spinal canal 08/06/2014   Muscle weakness (generalized) 11/12/2013   Tight fascia 11/12/2013   Decreased range of motion of shoulder 11/12/2013   Cervical spondylosis without myelopathy 10/29/2013   S/P carpal tunnel release 02/19/2013   CTS (carpal tunnel syndrome) 02/19/2013   SHOULDER PAIN 10/06/2007   IMPINGEMENT SYNDROME 10/06/2007   RUPTURE ROTATOR CUFF 10/06/2007   HTN (hypertension) 10/03/2007    PCP: Jerone Moorman   REFERRING PROVIDER: Tish Forge DIAG: Pelvic pain Syndrome    Rationale for Evaluation and Treatment Rehabilitation  THERAPY DIAG: Pelvic pain   Repeated falls   Neck pain   Sacrococcygeal disorders, not elsewhere classified  ONSET DATE:   SUBJECTIVE:        SUBJECTIVE STATEMENT TODAY  Pt reported after last session, her neck pain was 2/10  and today is  4/ 10 pain on and off  but not at constant  8/10 like in the past.                                                                                                 SUBJECTIVE STATEMENT ON EVAL 10/07/23 : 1) constant  pelvic pain:  Pt felt pain inside the vagina for the past 2 years. Gynecologist provided her cream to put inside. Pt applies it every other day and it relieves it by 1/10 but pain is still constant. Aaron Aas "Excruciatingly and "like a mm spasm" "  6/10     Pt had pain during pelvic exam.  Denied burning with urinating / bowel movement. Denied noticing any bulge.  Pt is able to start and complete urination without difficulty.    2)  neck pain:  8/10  constant. Pt is planning to get pain medication after today's visit   3)  repeated falls: see ( pertinent Hx below)   4) L hip pain: started out of nowhere. One day she was sweeping. Pain feels "something is lying top of it) . Denied radiating pain. Worse with sweeping, bending.  7/10    PERTINENT HISTORY:               Frequent Falls: Has patient fallen in last 6 months? 3   (6 months ago due to food poisoning for fast food:  pt hit the floor and was unconscious and had a "big old knot on her forehead")   ( Jan/Feb 1.5 months ago: 2 falls in a row due to  food poisioning from fast food  and fell with injury to head to the floor, she fell unconscious and she woke on her own. She got up and felt sick again, got up on her stool, and then woke up again on the floor. PCP know about it)   Dizzy spells came on around 1.5 month after her falls that occurred back to back  ( see below under Falls Hx). Pt gets the dizziness once she is up and standing but not with turning her head. Pt felt dizziness 3 different times.  "I feel woozy".  Pt drives but she had not been driving when she feels dizzy.  Yesterday, pt was getting  up from the toilet and she felt  dizzy and she had to lean on both sides against the walls to not fall.  Pt sits down for 10-15 min and she feels better.   Blood in her urine for  3-4 years ago and"they did not give me anything to clear it up."  Urine is cloudy. Pt drinks water  4-5 (8 z bottles) per day. Pt has blood in her urine with every trip to the toilet. "It is like a light orange".  Denied blood in her underwear.   Pt eats only one  full meal once a day. Pt said during session today, "I feel dehydrated". Water  was provided to pt.  Pt started to eat one meal day for the past 3 years because she heard to eat only one meal to not gain weight.   Chest pain/ mm spasms: for a 5 years. Pt has been on medication .  Pt had mm spasms on her L side. Pt went to the [facility] a week ago with mm spasms and  excruciating pain. Pt went through test which meg. Pt was prescribed medications for the mm spasms but she plans to pick them up today.   SOB: started a  month ago. Walking to the mailbox and coming back.     Pt is scheduled for CT scan after today's visit.   Medical Hx:  C section Abdominal hysterectomy Knee arthroscopy  L  Rotator cuff repair  B   Neck surgery  Shoulder arthroscopy with distal clavicle resection    Xi robotic assisted oophorectomy    PAIN: Are you having pain? Yes: see above   PRECAUTIONS: No   WEIGHT BEARING RESTRICTIONS: No  FALLS:  Has patient fallen in last 6 months? 3   (6 months ago due to food poisoning for fast food:  pt hit the floor and was unconscious and had a "big old knot on her forehead")   ( Jan/Feb 1.5 months ago: 2 falls in a row due to  food poisioning from fast food  and fell with injury to head to the floor, she fell unconscious and she woke on her own. She got up and felt sick again, got up on her stool, and then woke up again on the floor. PCP know about it)  Pt is scheduled for CT scan after today's visit.   LIVING ENVIRONMENT: Lives with: alone but her son  comes and goes weekly  Lives in:  house Stairs: 4 STE in front, 5 back door, with rail, one story house  Has following equipment at home: no  OCCUPATION: disabled    PLOF: pt 's husband died 6 months ago.   PATIENT GOALS:     OBJECTIVE:   .  [facility] PT Assessment - 11/20/23 1110       AROM   Overall AROM Comments 11/12/23  seated :  cervical B sideflexion 30 deg before pain,   ext  25 deg ,  flexion 30 deg,  L rotation 30 deg, R 20 deg .    11/20/23:  seated  cervical sideflexion L 30 deg/ R 45 deg.  cervical rotation: L 50 deg, R 55 deg ,   ext 20 deg, flexion  40 deg  ,      Palpation   SI assessment  levelled pelvis, B shoulder ( L shoulder no longer lowered)     Palpation comment hypomobile C/T junction, slight deviations lower C/T area, tightness along R posterior neck mm ,  [facility] Adult PT Treatment/Exercise - 11/20/23 1802       Neuro Re-ed    Neuro Re-ed Details  cued for chin tuck, elbow press for scapular retraction 3 sec, 10 reps      Modalities   Modalities Moist Heat      Moist Heat Therapy   Number Minutes Moist Heat 5 Minutes    Moist Heat Location Cervical      Manual Therapy   Manual therapy comments distraction at occipit mm, fascial mobilization at neck scar  , MWM to retore neck ROM                  HOME EXERCISE PROGRAM: See pt instruction section    ASSESSMENT:  CLINICAL IMPRESSION:  Improvements:    Shoe lift  in toe box and heel in L shoe levelled pelvis Tailbone no longer flexed and pt reported she was able to have bowel movements right after last session. Pt has had 2 bowel movements per day which is her normal.   neck pain was 2/10  and today is  4/ 10 pain on and off  but not at constant  8/10 like in the past.      Continued to addressed pt's c/o neck pain with  manual Tx after which  pt reported decreased neck pain and demo'd increased mobility. Pt still is limited in cervical ext which will further  require more manual Tx next session Added isometric contraction for cervical/ scapular retraction into HEP  Plan to continue to address neck limitations which will also help with upright posture and IAP system for minimizing not just neck pain but also  fall risks and pelvic pain.     Regional interdependent approaches will yield greater benefits in pt's POC.      Anticipate today's improvements will help to promote optimize IAP system for improved pelvic floor function, trunk stability, gait, balance, stabilization with mobility tasks.  Plan to address pelvic floor issues once pelvis and spine are realigned to yield better outcomes.     Pt benefits from skilled PT.       OBJECTIVE IMPAIRMENTS decreased activity tolerance, decreased coordination, decreased endurance, decreased mobility, difficulty walking, decreased ROM, decreased strength, decreased safety awareness, hypomobility, increased muscle spasms, impaired flexibility, improper body mechanics, postural dysfunction, and pain. scar restrictions   ACTIVITY LIMITATIONS  self-care, , home chores, work tasks    PARTICIPATION LIMITATIONS:  community  PERSONAL FACTORS  See pt additional medical Sx are also affecting patient's functional outcome.  Pt need medical work up   [facility] POTENTIAL: Good   CLINICAL DECISION MAKING: Evolving/moderate complexity   EVALUATION COMPLEXITY: Moderate    PATIENT EDUCATION:    Education details: Showed pt anatomy images. Explained muscles attachments/ connection, physiology of deep core system/ spinal- thoracic-pelvis-lower kinetic chain as they relate to pt's presentation, Sx, and past Hx. Explained what and how these areas of deficits need to be restored to balance and function    See Therapeutic activity / neuromuscular re-education section  Answered pt's questions.   Person educated: Patient Education method: Explanation, Demonstration, Tactile cues, Verbal cues, and Handouts Education  comprehension: verbalized understanding, returned demonstration, verbal cues required, tactile cues required, and needs further education     PLAN: PT FREQUENCY: 1x/week   PT DURATION: 10 weeks   PLANNED INTERVENTIONS: Therapeutic exercises, Therapeutic activity, Neuromuscular re-education, Balance training, Gait training, Patient/Family education, Self Care, Joint mobilization, Spinal mobilization, Moist heat, Taping, and Manual therapy, dry needling.  PLAN FOR NEXT SESSION: See clinical impression for plan     GOALS: Goals reviewed with patient? Yes  SHORT TERM GOALS: Target date: 11/04/2023    Pt will demo IND with HEP                    Baseline: Not IND            Goal status: INITIAL   LONG TERM GOALS: Target date: 12/16/2023    1.Pt will demo proper deep core coordination without chest breathing and optimal excursion of diaphragm/pelvic floor in order to promote spinal stability and pelvic floor function  Baseline: dyscoordination Goal status: INITIAL  2.  Pt will demo proper body mechanics in against gravity tasks and ADLs  work tasks, fitness  to minimize straining pelvic floor / back    Baseline: not IND, improper form that places strain on pelvic floor  Goal status: INITIAL    3. Pt will demo increased gait speed > 1.3 m/s with reciprocal gait pattern, longer stride length  in order to ambulate safely in community and return to fitness routine  Baseline:  1.06 m/s, R hip hike,  Goal status: INITIAL    4. Pt will demo levelled pelvic girdle and shoulder height in order to progress to deep core strengthening HEP and restore mobility at spine, pelvis, gait, posture minimize falls, and improve balance   Baseline: R shoulder and iliac crest higher than L  Goal status: INITIAL   5. Pt will be referred to gait training, falls prevention training PT to minimize falls  Baseline: frequent falls  Goal status: INITIAL      [place] Andreas, PT 11/20/2023, 11:06  AM

## 2023-11-20 NOTE — Patient Instructions (Signed)
 cued for chin tuck, elbow press for scapular retraction 3 sec, 10 reps

## 2023-11-20 NOTE — Progress Notes (Unsigned)
 PROVIDER NOTE: Interpretation of information contained herein should be left to medically-trained personnel. Specific patient instructions are provided elsewhere under "Patient Instructions" section of medical record. This document was created in part using AI and STT-dictation technology, any transcriptional errors that may result from this process are unintentional.  Patient: Kimberly [name]  Service: E/M   PCP: Monique Ano, MD  DOB: 1961-04-09  DOS: 11/21/2023  Provider: Cherylin Corrigan, NP  MRN: [id number]  Delivery: Face-to-face  Specialty: Interventional Pain Management  Type: Established Patient  Setting: Ambulatory outpatient facility  Specialty designation: 09  Referring Prov.: Monique Ano, MD  Location: Outpatient office facility       HPI  [name], a 63 y.o. year old female, is here today because of her Medication management [Z79.899]. Kimberly [name]'s primary complain today is No chief complaint on file.   Pain Assessment: Severity of   is reported as a  /10. Location:    / . Onset:  . Quality:  . Timing:  . Modifying factor(s):  Aaron Aas Vitals:  vitals were not taken for this visit.  BMI: Estimated body mass index is 28.34 kg/m as calculated from the following:   Height as of 09/26/23: 5\' 1"  (1.549 m).   Weight as of 09/26/23: 150 lb (68 kg). Last encounter: 08/29/2023.  Reason for encounter: medication management.  The patient states that they are tolerating the current medication regimen well without any new concerns. Pharmacotherapy Assessment  Analgesic: Oxycodone  (Oxy IR/oxycodone ) 5 mg immediate release every 6 hours as needed for severe pain. MME=30  Monitoring:  PMP: PDMP reviewed during this encounter.       Pharmacotherapy: No side-effects or adverse reactions reported. Compliance: No problems identified. Effectiveness: Clinically acceptable.  No notes on file  No results found for: "CBDTHCR" No results found for: "D8THCCBX" No results found for:  "D9THCCBX"  UDS:  Summary  Date Value Ref Range Status  03/05/2023 Note  Final    Comment:    ==================================================================== [employer] Select 13 (MW) ==================================================================== Specimen Alert Not Detected result may be consistent with the time of last use noted for this medication. AS NEEDED (Diazepam ) ==================================================================== Test                             Result       Flag       Units  Drug Present and Declared for Prescription Verification   Oxycodone                       1440         EXPECTED   ng/mg creat   Oxymorphone                    2005         EXPECTED   ng/mg creat   Noroxycodone                   >3135        EXPECTED   ng/mg creat   Noroxymorphone                 780          EXPECTED   ng/mg creat    Sources of oxycodone  are scheduled prescription medications.    Oxymorphone, noroxycodone, and noroxymorphone are expected    metabolites of oxycodone . Oxymorphone is also available as a    scheduled prescription  medication.  Drug Absent but Declared for Prescription Verification   Diazepam                        Not Detected UNEXPECTED ng/mg creat ==================================================================== Test                      Result    Flag   Units      Ref Range   Creatinine              319              mg/dL      >=06 ==================================================================== Declared Medications:  The flagging and interpretation on this report are based on the  following declared medications.  Unexpected results may arise from  inaccuracies in the declared medications.   **Note: The testing scope of this panel includes these medications:   Diazepam   Oxycodone  (Roxicodone )   **Note: The testing scope of this panel does not include the  following reported medications:   Acetaminophen  (Tylenol )  Amlodipine   (Norvasc )  Cyclobenzaprine  (Flexeril )  Estradiol  (Estrace )  Estrogens  (Premarin )  Eye Drop  Fluconazole  (Diflucan )  Gabapentin  (Neurontin )  Hydroxyzine  (Atarax )  Hyoscyamine (Uro-MP )  Ibuprofen  (Advil )  Liothyronine  (Cytomel )  Meloxicam  (Mobic )  Methenamine (Uro-MP )  Methylene blue  (Uro-MP )  Pantoprazole  (Protonix )  Phenyl salicylate (Uro-MP )  Rizatriptan (Maxalt)  Sodium phosphate , monobasic (Uro-MP )  Timolol (Timoptic)  Valacyclovir  (Valtrex )  Vitamin D3 ==================================================================== For clinical consultation, please call [phone]. ====================================================================       ROS  Constitutional: Denies any fever or chills Gastrointestinal: No reported hemesis, hematochezia, vomiting, or acute GI distress Musculoskeletal: Denies any acute onset joint swelling, redness, loss of ROM, or weakness Neurological: No reported episodes of acute onset apraxia, aphasia, dysarthria, agnosia, amnesia, paralysis, loss of coordination, or loss of consciousness  Medication Review  NONFORMULARY OR COMPOUNDED ITEM, Naphazoline-Pheniramine, Uro-MP , acetaminophen , amLODipine , bimatoprost, cholecalciferol , conjugated estrogens , cyclobenzaprine , escitalopram , estradiol , fluconazole , gabapentin , hydrOXYzine , ibuprofen , latanoprost, liothyronine , meloxicam , methocarbamol , naloxone, ondansetron , oxyCODONE , pantoprazole , phenazopyridine , rizatriptan, timolol, and valACYclovir   History Review  Allergy: Kimberly [name] is allergic to shellfish allergy, peanut-containing drug products, codeine, diphtheria toxoid, and tetanus toxoids. Drug: Kimberly [name]  reports no history of drug use. Alcohol:  reports current alcohol use. Tobacco:  reports that she has never smoked. She has never used smokeless tobacco. Social: Kimberly [name]  reports that she has never smoked. She has never used smokeless tobacco. She reports current alcohol use.  She reports that she does not use drugs. Medical:  has a past medical history of Anxiety, Arthritis, Asthma, Balance problem, Brain cyst, Carpal tunnel syndrome, bilateral, Cervical fusion syndrome, Complete rupture of rotator cuff, Complication of anesthesia, Degenerative disc disease, cervical, Depression, Difficult intubation, Flu (08/10/2018), GERD (gastroesophageal reflux disease), Headache, Herpes, History of kidney stones, History of pneumonia, Hyperlipidemia, Hypertension, Hypothyroidism, Inflammation of shoulder joint, Memory difficulties, Neuromuscular disorder (HCC), Occipital neuralgia, Pneumothorax on right, Pseudoarthrosis of cervical spine (HCC), Recurrent falls, Syncope, and Urinary frequency. Surgical: Ms. Luckett  has a past surgical history that includes Abdominal hysterectomy; Rotator cuff repair (Bilateral, 2014); Foreign Body Removal (Left); Carpal tunnel release (Left, 02/16/2013); Carpal tunnel release (Right, 03/27/2013); Cesarean section; Anterior cervical decomp/discectomy fusion (N/A, 08/06/2014); Colonoscopy; Nasal sinus surgery (N/A, 04/13/2015); Shoulder arthroscopy with distal clavicle resection (Left, 05/18/2015); Shoulder acromioplasty (Left, 05/18/2015); Cholecystectomy (N/A, 06/17/2015); Neck surgery (2016); Anterior cervical decomp/discectomy fusion (N/A, 07/04/2016); Posterior cervical fusion/foraminotomy (N/A, 11/13/2016); Knee arthroscopy; Extracorporeal shock wave lithotripsy (Left, 09/04/2018); Colonoscopy with  propofol  (N/A, 05/06/2019); Reduction mammaplasty (10/2019); Esophagogastroduodenoscopy (N/A, 05/08/2021); cysto with hydrodistension (N/A, 09/26/2021); Cystoscopy w/ retrogrades (Bilateral, 09/26/2021); Cystoscopy with biopsy (N/A, 09/26/2021); Ureteroscopy (Bilateral, 09/26/2021); Esophagogastroduodenoscopy (11/25/2017); Xi robotic assisted oophorectomy (N/A, 12/22/2021); and Excision vaginal cyst (N/A, 12/22/2021). Family: family history includes Cancer in her  father; Hypertension in her father and another family member.  Laboratory Chemistry Profile   Renal Lab Results  Component Value Date   BUN 18 10/25/2023   CREATININE 1.62 (H) 10/25/2023   BCR 10 07/01/2019   GFRAA 86 07/01/2019   GFRNONAA 36 (L) 10/25/2023    Hepatic Lab Results  Component Value Date   AST 20 08/10/2021   ALT 12 08/10/2021   ALBUMIN  3.3 (L) 08/10/2021   ALKPHOS 58 08/10/2021   LIPASE 34 08/10/2021    Electrolytes Lab Results  Component Value Date   NA 137 10/25/2023   K 3.3 (L) 10/25/2023   CL 101 10/25/2023   CALCIUM 9.6 10/25/2023    Bone No results found for: "VD25OH", "VD125OH2TOT", "ZO1096EA5", "WU9811BJ4", "25OHVITD1", "25OHVITD2", "25OHVITD3", "TESTOFREE", "TESTOSTERONE "  Inflammation (CRP: Acute Phase) (ESR: Chronic Phase) Lab Results  Component Value Date   ESRSEDRATE 35 (H) 08/12/2021         Note: Above Lab results reviewed.  Recent Imaging Review  CT Head Wo Contrast CLINICAL DATA:  Head trauma, or repeated vomiting. Laceration to forehead and bruising to left arm  EXAM: CT HEAD WITHOUT CONTRAST  TECHNIQUE: Contiguous axial images were obtained from the base of the skull through the vertex without intravenous contrast.  RADIATION DOSE REDUCTION: This exam was performed according to the departmental dose-optimization program which includes automated exposure control, adjustment of the mA and/or kV according to patient size and/or use of iterative reconstruction technique.  COMPARISON:  CT head 10/07/2023  FINDINGS: Brain: No intracranial hemorrhage, mass effect, or evidence of acute infarct. No hydrocephalus. No extra-axial fluid collection. Dilated perivascular space in left basal ganglia.  Vascular: No hyperdense vessel or unexpected calcification.  Skull: No fracture or focal lesion.  Small forehead contusion.  Sinuses/Orbits: No acute finding.  Other: None.  IMPRESSION: No acute intracranial  abnormality.  Electronically Signed   By: Rozell Cornet M.D.   On: 10/25/2023 20:30 DG Chest 2 View CLINICAL DATA:  low oxygen.  Nausea/vomiting and diarrhea.  EXAM: CHEST - 2 VIEW  COMPARISON:  08/10/2018.  FINDINGS: Minimal residual linear scarring/atelectasis noted at the right lung base. Bilateral lung fields are otherwise clear. No acute consolidation or lung collapse. Bilateral costophrenic angles are clear.  Normal cardio-mediastinal silhouette.  No acute osseous abnormalities.  Cervicothoracic spinal fixation hardware noted.  The soft tissues are within normal limits.  There are surgical clips in the right upper quadrant, typical of a previous cholecystectomy.  IMPRESSION: No active cardiopulmonary disease.  Electronically Signed   By: Beula Brunswick M.D.   On: 10/25/2023 16:56 CT HEAD WO CONTRAST ( ) CLINICAL DATA:  Fall twice in January with syncopal episodes and head injury. Dizziness  EXAM: CT HEAD WITHOUT CONTRAST  TECHNIQUE: Contiguous axial images were obtained from the base of the skull through the vertex without intravenous contrast.  RADIATION DOSE REDUCTION: This exam was performed according to the departmental dose-optimization program which includes automated exposure control, adjustment of the mA and/or kV according to patient size and/or use of iterative reconstruction technique.  COMPARISON:  08/20/2022  FINDINGS: Brain: No evidence of acute infarction, hemorrhage, hydrocephalus, extra-axial collection or mass lesion/mass effect. Mild low-density in the cerebral white matter attributed to  chronic small vessel ischemia. Dilated perivascular space below the left putamen.  Vascular: No hyperdense vessel or unexpected calcification.  Skull: Normal. Negative for fracture or focal lesion.  Sinuses/Orbits: No acute finding.  IMPRESSION: No acute or interval finding.  Electronically Signed   By: Ronnette Coke M.D.   On:  10/25/2023 05:35 Note: Reviewed        Physical Exam  General appearance: Well nourished, well developed, and well hydrated. In no apparent acute distress Mental status: Alert, oriented x 3 (person, place, & time)       Respiratory: No evidence of acute respiratory distress Eyes: PERLA Vitals: There were no vitals taken for this visit. BMI: Estimated body mass index is 28.34 kg/m as calculated from the following:   Height as of 09/26/23: 5\' 1"  (1.549 m).   Weight as of 09/26/23: 150 lb (68 kg). Ideal: Patient weight not recorded  Assessment   Diagnosis Status  1. Medication management   2. Cervical spondylosis without myelopathy   3. Bilateral occipital neuralgia   4. Cervical fusion syndrome (C2-T1)   5. Cervical myofascial pain syndrome   6. Hx of cervical spine surgery    Controlled Controlled Controlled   Plan of Care  Assessment and Plan Will continue on the current medication regimen, as it provides symptoms relief and supports the patient's functional activities.  Prescribing drug monitoring (PDMP) is reviewed and consistent with prescribed medication.   Pharmacotherapy (Medications Ordered): No orders of the defined types were placed in this encounter.  Orders:  No orders of the defined types were placed in this encounter.  Follow-up plan:   No follow-ups on file.    Recent Visits Date Type Provider Dept  08/29/23 Office Visit Cephus Collin, MD [facility]  Showing recent visits within past 90 days and meeting all other requirements Future Appointments Date Type Provider Dept  11/21/23 Appointment Cephus Collin, MD [facility]  Showing future appointments within next 90 days and meeting all other requirements  I discussed the assessment and treatment plan with the patient. The patient was provided an opportunity to ask questions and all were answered. The patient agreed with the plan and demonstrated an understanding of the  instructions.  Patient advised to call back or seek an in-person evaluation if the symptoms or condition worsens.  Duration of encounter: 30 minutes.  Total time on encounter, as per AMA guidelines included both the face-to-face and non-face-to-face time personally spent by the physician and/or other qualified health care professional(s) on the day of the encounter (includes time in activities that require the physician or other qualified health care professional and does not include time in activities normally performed by clinical staff). Physician's time may include the following activities when performed: Preparing to see the patient (e.g., pre-charting review of records, searching for previously ordered imaging, lab work, and nerve conduction tests) Review of prior analgesic pharmacotherapies. Reviewing PMP Interpreting ordered tests (e.g., lab work, imaging, nerve conduction tests) Performing post-procedure evaluations, including interpretation of diagnostic procedures Obtaining and/or reviewing separately obtained history Performing a medically appropriate examination and/or evaluation Counseling and educating the patient/family/caregiver Ordering medications, tests, or procedures Referring and communicating with other health care professionals (when not separately reported) Documenting clinical information in the electronic or other health record Independently interpreting results (not separately reported) and communicating results to the patient/ family/caregiver Care coordination (not separately reported)  Note by: Eileen Kangas K Rene Gonsoulin, NP (TTS and AI technology used. I apologize for any typographical errors that  were not detected and corrected.) Date: 11/21/2023; Time: 3:57 PM

## 2023-11-21 ENCOUNTER — Other Ambulatory Visit: Payer: Self-pay

## 2023-11-21 ENCOUNTER — Telehealth: Payer: Self-pay | Admitting: Urology

## 2023-11-21 ENCOUNTER — Ambulatory Visit
Payer: 59 | Attending: Student in an Organized Health Care Education/Training Program | Admitting: Student in an Organized Health Care Education/Training Program

## 2023-11-21 ENCOUNTER — Encounter: Payer: Self-pay | Admitting: Student in an Organized Health Care Education/Training Program

## 2023-11-21 VITALS — BP 143/106 | HR 94 | Temp 97.2°F | Resp 18 | Ht 61.0 in | Wt 150.0 lb

## 2023-11-21 DIAGNOSIS — M7918 Myalgia, other site: Secondary | ICD-10-CM | POA: Diagnosis present

## 2023-11-21 DIAGNOSIS — Q761 Klippel-Feil syndrome: Secondary | ICD-10-CM | POA: Insufficient documentation

## 2023-11-21 DIAGNOSIS — Z79899 Other long term (current) drug therapy: Secondary | ICD-10-CM | POA: Insufficient documentation

## 2023-11-21 DIAGNOSIS — Z9889 Other specified postprocedural states: Secondary | ICD-10-CM | POA: Diagnosis present

## 2023-11-21 DIAGNOSIS — M5030 Other cervical disc degeneration, unspecified cervical region: Secondary | ICD-10-CM | POA: Diagnosis present

## 2023-11-21 DIAGNOSIS — G894 Chronic pain syndrome: Secondary | ICD-10-CM | POA: Diagnosis present

## 2023-11-21 DIAGNOSIS — M5481 Occipital neuralgia: Secondary | ICD-10-CM | POA: Insufficient documentation

## 2023-11-21 DIAGNOSIS — M47812 Spondylosis without myelopathy or radiculopathy, cervical region: Secondary | ICD-10-CM | POA: Insufficient documentation

## 2023-11-21 DIAGNOSIS — R102 Pelvic and perineal pain: Secondary | ICD-10-CM

## 2023-11-21 MED ORDER — OXYCODONE HCL 5 MG PO TABS
5.0000 mg | ORAL_TABLET | Freq: Four times a day (QID) | ORAL | 0 refills | Status: DC | PRN
Start: 2024-02-03 — End: 2024-02-17

## 2023-11-21 MED ORDER — PREMARIN 0.625 MG/GM VA CREA: TOPICAL_CREAM | VAGINAL | 1 refills | Status: AC

## 2023-11-21 MED ORDER — OXYCODONE HCL 5 MG PO TABS: 5.0000 mg | ORAL_TABLET | Freq: Four times a day (QID) | ORAL | 0 refills | Status: AC | PRN

## 2023-11-21 MED ORDER — GABAPENTIN 600 MG PO TABS: 600.0000 mg | ORAL_TABLET | Freq: Three times a day (TID) | ORAL | 5 refills | Status: DC

## 2023-11-21 NOTE — Progress Notes (Signed)
 Nursing Pain Medication Assessment:  Safety precautions to be maintained throughout the outpatient stay will include: orient to surroundings, keep bed in low position, maintain call bell within reach at all times, provide assistance with transfer out of bed and ambulation.  Medication Inspection Compliance: Pill count conducted under aseptic conditions, in front of the patient. Neither the pills nor the bottle was removed from the patient's sight at any time. Once count was completed pills were immediately returned to the patient in their original bottle.  Medication: Oxycodone  IR Pill/Patch Count:  83 of 120 pills remain Pill/Patch Appearance: Markings consistent with prescribed medication Bottle Appearance: Standard pharmacy container. Clearly labeled. Filled Date: 04 / 08 / 2025 Last Medication intake:  Today

## 2023-11-21 NOTE — Telephone Encounter (Signed)
 Pt aware rx erxed to [facility] [place].

## 2023-11-21 NOTE — Patient Instructions (Addendum)
 Pick up dates:  12/05/23 01/04/24 02/03/24 - 03/04/24   Medication Rules  Applies to: All patients receiving prescriptions (written or electronic).  Pharmacy of record: Pharmacy where electronic prescriptions will be sent. If written prescriptions are taken to a different pharmacy, please inform the nursing staff. The pharmacy listed in the electronic medical record should be the one where you would like electronic prescriptions to be sent.  Prescription refills: Only during scheduled appointments. Applies to both, written and electronic prescriptions.  NOTE: The following applies primarily to controlled substances (Opioid* Pain Medications).   Patient's responsibilities: Pain Pills: Bring all pain pills to every appointment (except for procedure appointments). Pill Bottles: Bring pills in original pharmacy bottle. Always bring newest bottle. Bring bottle, even if empty. Medication refills: You are responsible for knowing and keeping track of what medications you need refilled. The day before your appointment, write a list of all prescriptions that need to be refilled. Bring that list to your appointment and give it to the admitting nurse. Prescriptions will be written only during appointments. If you forget a medication, it will not be "Called in", "Faxed", or "electronically sent". You will need to get another appointment to get these prescribed. Prescription Accuracy: You are responsible for carefully inspecting your prescriptions before leaving our office. Have the discharge nurse carefully go over each prescription with you, before taking them home. Make sure that your name is accurately spelled, that your address is correct. Check the name and dose of your medication to make sure it is accurate. Check the number of pills, and the written instructions to make sure they are clear and accurate. Make sure that you are given enough medication to last until your next medication refill  appointment. Taking Medication: Take medication as prescribed. Never take more pills than instructed. Never take medication more frequently than prescribed. Taking less pills or less frequently is permitted and encouraged, when it comes to controlled substances (written prescriptions).  Inform other Doctors: Always inform, all of your healthcare providers, of all the medications you take. Pain Medication from other Providers: You are not allowed to accept any additional pain medication from any other Doctor or Healthcare provider. There are two exceptions to this rule. (see below) In the event that you require additional pain medication, you are responsible for notifying us , as stated below. Medication Agreement: You are responsible for carefully reading and following our Medication Agreement. This must be signed before receiving any prescriptions from our practice. Safely store a copy of your signed Agreement. Violations to the Agreement will result in no further prescriptions. (Additional copies of our Medication Agreement are available upon request.) Laws, Rules, & Regulations: All patients are expected to follow all [address] and [employer], Rules, [place] [employer] [place] [employer]. Ignorance of the Laws does not constitute a valid excuse. The use of any illegal substances is prohibited. Adopted CDC guidelines & recommendations: Target dosing levels will be at or below 60 MME/day. Use of benzodiazepines** is not recommended.  Exceptions: There are only two exceptions to the rule of not receiving pain medications from other Healthcare Providers. Exception #1 (Emergencies): In the event of an emergency (i.e.: accident requiring emergency care), you are allowed to receive additional pain medication. However, you are responsible for: As soon as you are able, call our office [phone], at any time of the day or night, and leave a message stating your name, the date and nature of the emergency, and the name and dose  of the medication prescribed. In  the event that your call is answered by a member of our staff, make sure to document and save the date, time, and the name of the person that took your information.  Exception #2 (Planned Surgery): In the event that you are scheduled by another doctor or dentist to have any type of surgery or procedure, you are allowed (for a period no longer than 30 days), to receive additional pain medication, for the acute post-op pain. However, in this case, you are responsible for picking up a copy of our "Post-op Pain Management for Surgeons" handout, and giving it to your surgeon or dentist. This document is available at our office, and does not require an appointment to obtain it. Simply go to our office during business hours (Monday-Thursday from 8:00 AM to 4:00 PM) (Friday 8:00 AM to 12:00 Noon) or if you have a scheduled appointment with us , prior to your surgery, and ask for it by name. In addition, you will need to provide us  with your name, name of your surgeon, type of surgery, and date of procedure or surgery.  *Opioid medications include: morphine , codeine, oxycodone , oxymorphone, hydrocodone , hydromorphone , meperidine, tramadol , tapentadol, buprenorphine, fentanyl , methadone. **Benzodiazepine medications include: diazepam  (Valium ), alprazolam  (Xanax ), clonazepam (Klonopine), lorazepam (Ativan), clorazepate (Tranxene), chlordiazepoxide (Librium), estazolam (Prosom), oxazepam (Serax), temazepam (Restoril), triazolam (Halcion) (Last updated: 09/26/2017)

## 2023-11-21 NOTE — Telephone Encounter (Signed)
 Patient stopped in requesting a RX refill be sent for Premarin  to [facility] in [place]. Patient states she called pharmacy and was told that there are no refills remaining. Please advise patient.

## 2023-11-25 ENCOUNTER — Ambulatory Visit: Payer: 59 | Admitting: Physical Therapy

## 2023-11-28 IMAGING — CR DG ABDOMEN 1V
2 series · 2 of 2 positions shown · non-contrast
Comparison: 02/08/2021

CLINICAL DATA: Kidney stone

EXAM:
ABDOMEN - 1 VIEW

[abdomen kub (1 of 2)]
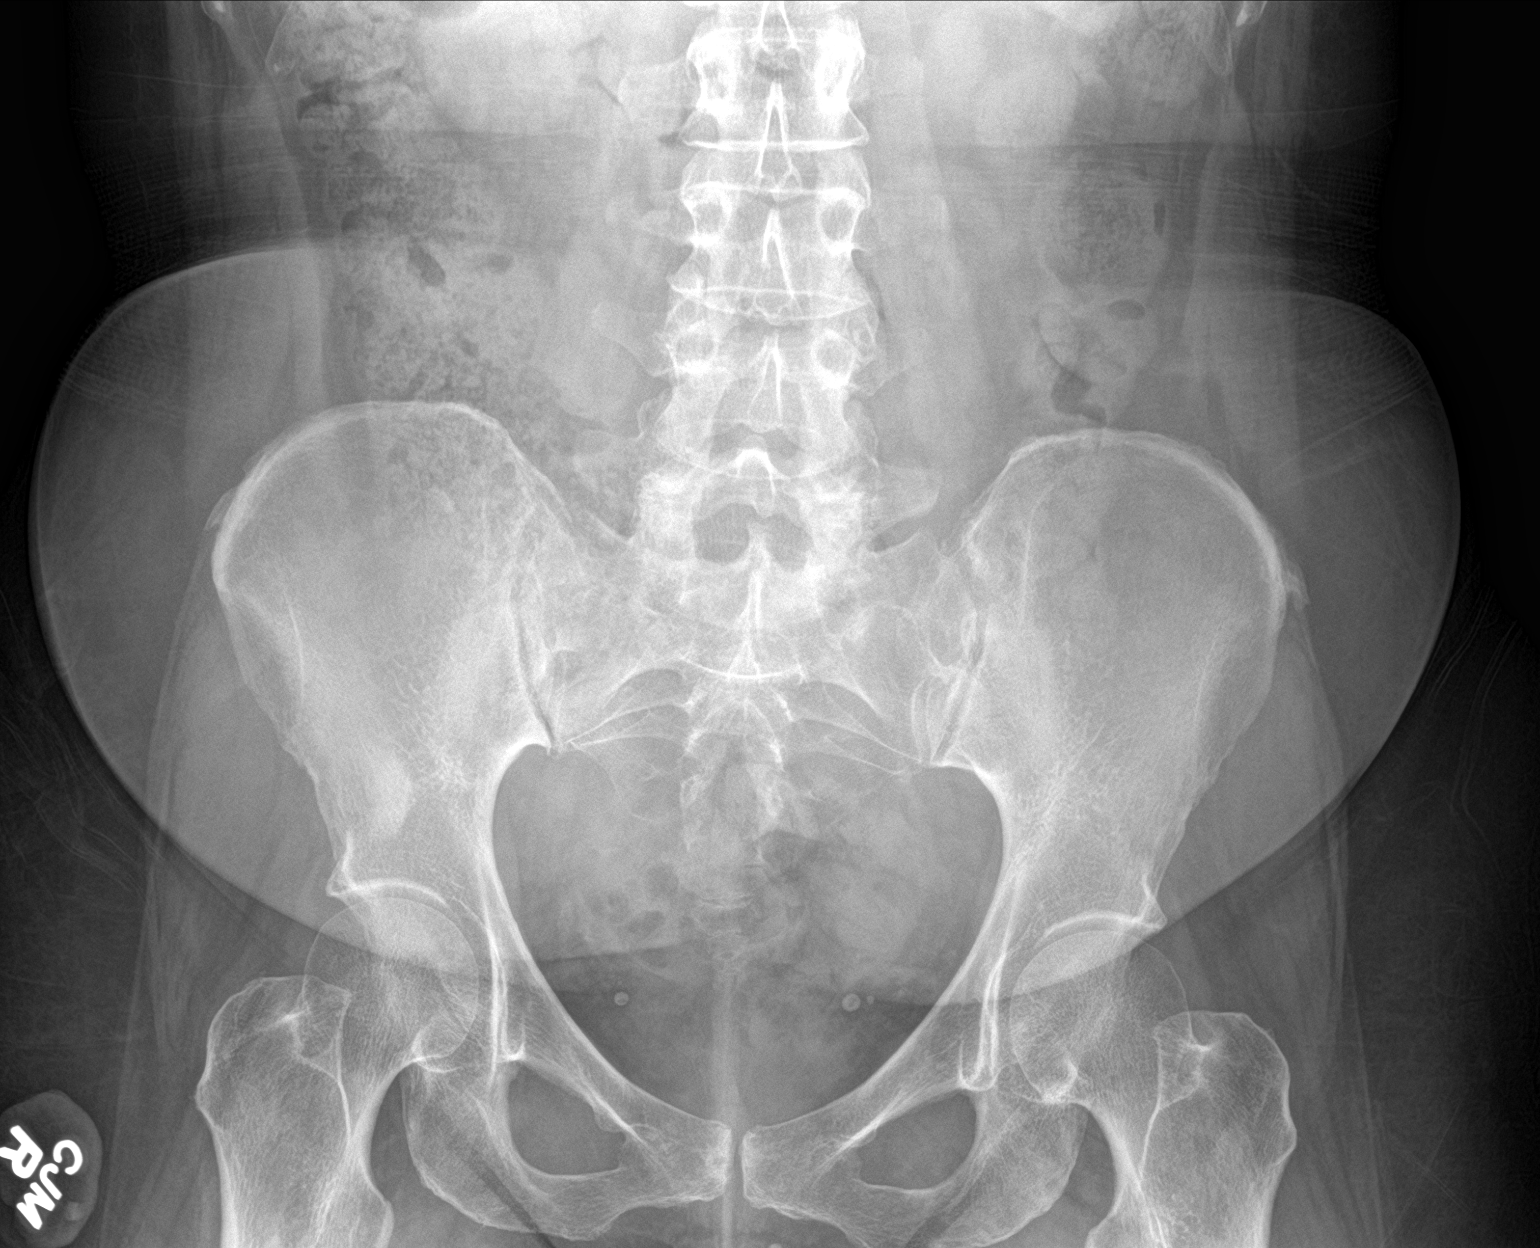

[abdomen kub (2 of 2)]
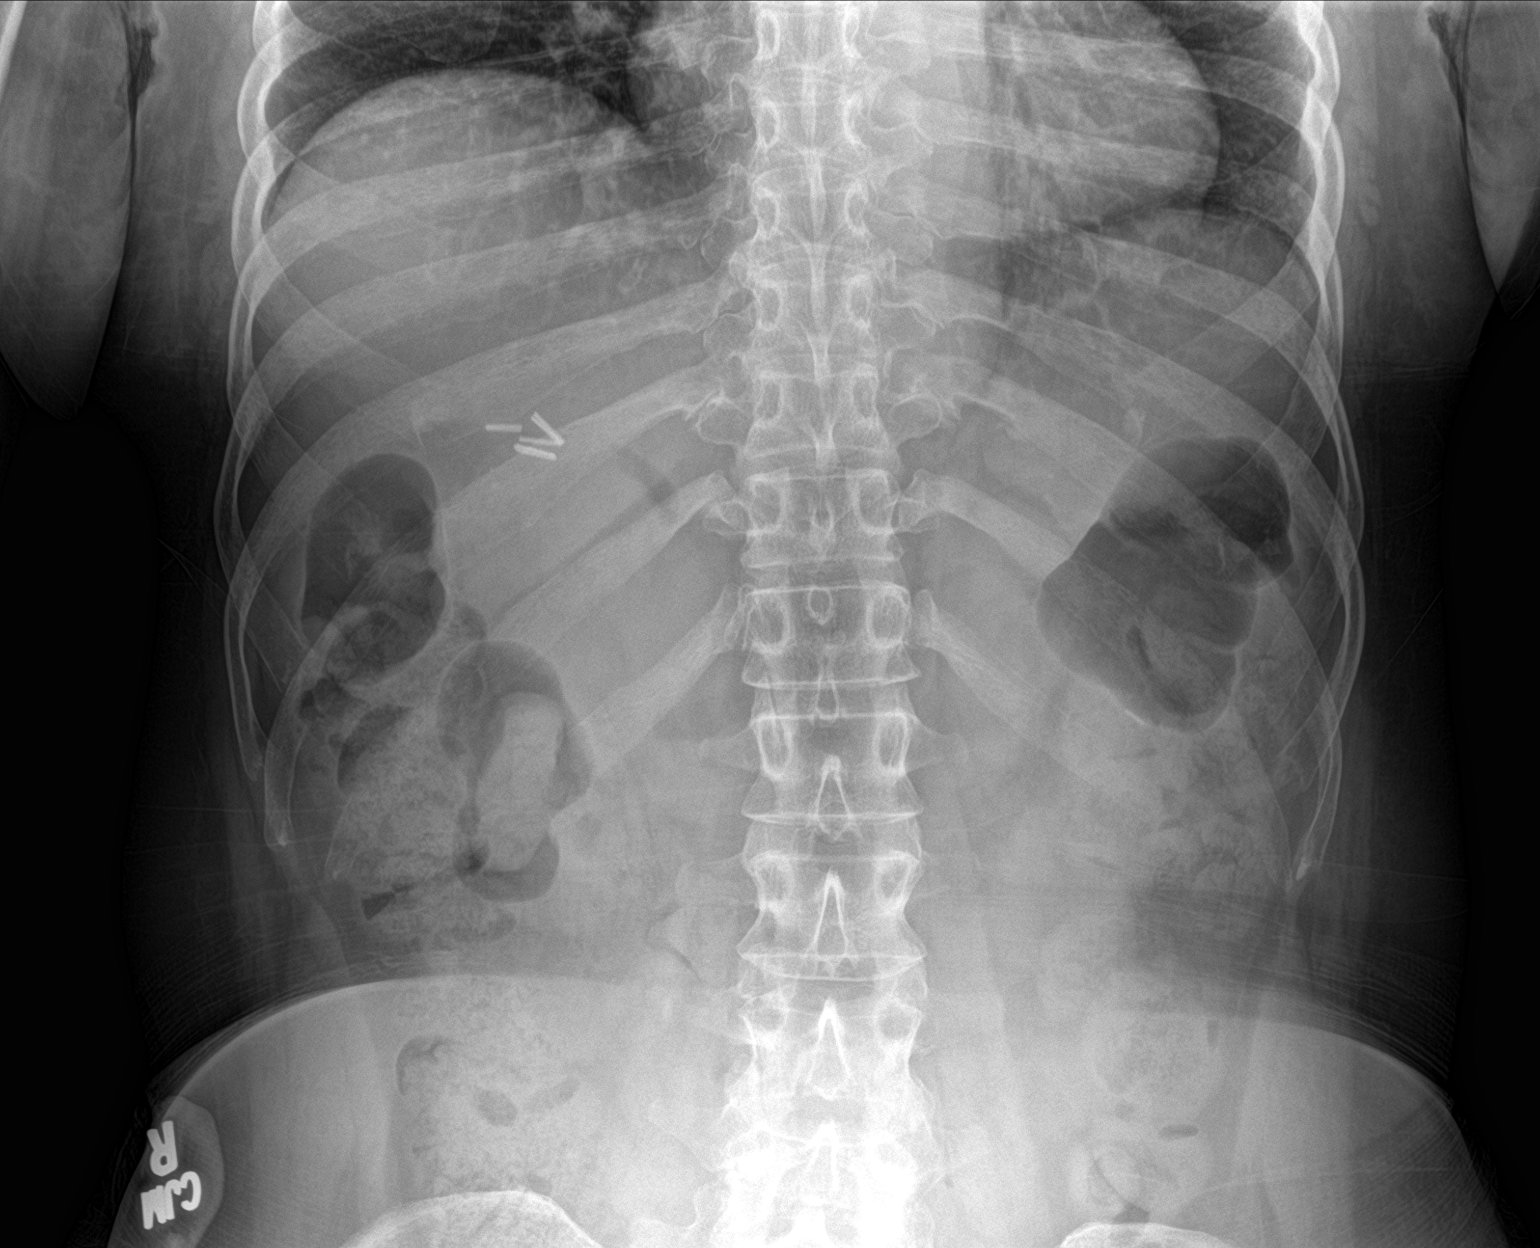

[2 of 2 positions shown; findings below may reference images not displayed]

FINDINGS: Bowel gas pattern is nonspecific. Moderate amount of stool is seen
in the colon without evidence of fecal impaction in the
rectosigmoid. Surgical clips are seen in gallbladder fossa. Possible
air is seen in the bile ducts. Kidneys are partly obscured by bowel
contents limiting evaluation for small stones. As far as seen, no
definite renal or ureteral stones are noted. There is faint
sclerosis in the right iliac bone with no significant change,
possibly benign bone island.
IMPRESSION: Nonspecific bowel gas pattern. Kidneys are partly obscured by bowel
contents limiting evaluation for small renal stones.

## 2023-12-02 ENCOUNTER — Ambulatory Visit: Payer: 59 | Attending: Obstetrics and Gynecology | Admitting: Physical Therapy

## 2023-12-02 DIAGNOSIS — M533 Sacrococcygeal disorders, not elsewhere classified: Secondary | ICD-10-CM | POA: Diagnosis present

## 2023-12-02 DIAGNOSIS — R102 Pelvic and perineal pain: Secondary | ICD-10-CM | POA: Diagnosis present

## 2023-12-02 DIAGNOSIS — M542 Cervicalgia: Secondary | ICD-10-CM | POA: Insufficient documentation

## 2023-12-02 DIAGNOSIS — R296 Repeated falls: Secondary | ICD-10-CM | POA: Diagnosis present

## 2023-12-02 NOTE — Patient Instructions (Signed)
 decrease TV watching from 3 hours to 1 hour to minimize stiffness of neck.   turn off TV 2 hours before bed, pt voiced she will use time for bible study and gentle stretches before bed as a wind down routine.   __  Stretches on your back in the morning and night    __  During commericals ,   Seated stretches with feet on the ground    - rainbow, leaning with arm overhead  4 reps   - rainbow with higher five up to the sky 4 reps    -rotation, inhale tall, exhale rotate belly , heart, neck    L and R   ___  Next commerical break:  Standing marches:  Feet planted hip width not narrow Not lock knees, notice 4 points of contact in the feet , spread toes

## 2023-12-02 NOTE — Therapy (Signed)
 OUTPATIENT PHYSICAL THERAPY TREATMENT   Patient Name: Kimberly [name] MRN: [id number] DOB:1961/07/30, 63 y.o., female Today's Date: 12/02/2023   PT End of Session - 12/02/23 1116     Visit Number 6    Number of Visits 10    Date for PT Re-Evaluation 12/16/23    PT Start Time 1113    PT Stop Time 1153    PT Time Calculation (min) 40 min    Activity Tolerance Patient tolerated treatment well;No increased pain    Behavior During Therapy WFL for tasks assessed/performed             Past Medical History:  Diagnosis Date   Anxiety    takes alprazolam  - rare use    Arthritis    cervical spondylosis    Asthma    Balance problem    Brain cyst    Carpal tunnel syndrome, bilateral    Cervical fusion syndrome    Complete rupture of rotator cuff    Complication of anesthesia    lung collapsed after neck fusion   Degenerative disc disease, cervical    Depression    Difficult intubation    Small mouth opening, limited neck flexion, very anterior    Flu 08/10/2018   tested positive   GERD (gastroesophageal reflux disease)    pt. reports that its better, no meds in use at this time- 2015   Headache    Herpes    History of kidney stones    History of pneumonia    Hyperlipidemia    Hypertension    pt. doesn't see cardiologist, followed for HTN by Dr. Lewayne Records   Hypothyroidism    Inflammation of shoulder joint    Memory difficulties    Neuromuscular disorder (HCC)    joint and muscle problems   Occipital neuralgia    Pneumothorax on right    following C4-6 ACDF 07/04/16   Pseudoarthrosis of cervical spine (HCC)    Recurrent falls    Syncope    Urinary frequency    Past Surgical History:  Procedure Laterality Date   ABDOMINAL HYSTERECTOMY     ANTERIOR CERVICAL DECOMP/DISCECTOMY FUSION N/A 08/06/2014   Procedure: ANTERIOR CERVICAL DECOMPRESSION/DISCECTOMY FUSION CERVICAL THREE-FOUR,CERVICAL SIX-SEVEN ,CERVICAL SEVEN-THORACIC ONE;  Surgeon: Adelbert Adler, MD;  Location:  [facility] OR;  Service: Neurosurgery;  Laterality: N/A;   ANTERIOR CERVICAL DECOMP/DISCECTOMY FUSION N/A 07/04/2016   Procedure: CERVICAL FOUR-FIVE, CERVICAL FIVE-SIX ANTERIOR CERVICAL DECOMPRESSION/DISCECTOMY/FUSION WITH REVISION OF CERVICAL THREE-FOUR PLATE;  Surgeon: Claudetta Cuba, MD;  Location: [facility] OR;  Service: Neurosurgery;  Laterality: N/A;   CARPAL TUNNEL RELEASE Left 02/16/2013   Procedure: CARPAL TUNNEL RELEASE;  Surgeon: Darrin Emerald, MD;  Location: [facility] ORS;  Service: Orthopedics;  Laterality: Left;   CARPAL TUNNEL RELEASE Right 03/27/2013   Procedure: RIGHT CARPAL TUNNEL RELEASE;  Surgeon: Darrin Emerald, MD;  Location: [facility];  Service: Orthopedics;  Laterality: Right;   CESAREAN SECTION     x2   CHOLECYSTECTOMY N/A 06/17/2015   Procedure: LAPAROSCOPIC CHOLECYSTECTOMY;  Surgeon: Alanda Allegra, MD;  Location: [facility];  Service: General;  Laterality: N/A;   COLONOSCOPY     COLONOSCOPY WITH PROPOFOL  N/A 05/06/2019   Procedure: COLONOSCOPY WITH PROPOFOL ;  Surgeon: Marshall Skeeter, MD;  Location: [facility] ENDOSCOPY;  Service: Endoscopy;  Laterality: N/A;   CYSTO WITH HYDRODISTENSION N/A 09/26/2021   Procedure: CYSTOSCOPY/HYDRODISTENSION;  Surgeon: Geraline Knapp, MD;  Location: [facility] ORS;  Service: Urology;  Laterality: N/A;   CYSTOSCOPY W/ RETROGRADES Bilateral 09/26/2021  Procedure: CYSTOSCOPY WITH RETROGRADE PYELOGRAM;  Surgeon: Geraline Knapp, MD;  Location: [facility] ORS;  Service: Urology;  Laterality: Bilateral;   CYSTOSCOPY WITH BIOPSY N/A 09/26/2021   Procedure: CYSTOSCOPY WITH BIOPSY;  Surgeon: Geraline Knapp, MD;  Location: [facility] ORS;  Service: Urology;  Laterality: N/A;   ESOPHAGOGASTRODUODENOSCOPY N/A 05/08/2021   Procedure: ESOPHAGOGASTRODUODENOSCOPY (EGD);  Surgeon: Shane Darling, MD;  Location: [facility] ENDOSCOPY;  Service: Endoscopy;  Laterality: N/A;   ESOPHAGOGASTRODUODENOSCOPY  11/25/2017   EXCISION VAGINAL CYST N/A 12/22/2021   Procedure: EXCISION VULVAR CYST;   Surgeon: Prescilla Brod, MD;  Location: [facility] ORS;  Service: Gynecology;  Laterality: N/A;   EXTRACORPOREAL SHOCK WAVE LITHOTRIPSY Left 09/04/2018   Procedure: EXTRACORPOREAL SHOCK WAVE LITHOTRIPSY (ESWL);  Surgeon: Lawerence Pressman, MD;  Location: [facility] ORS;  Service: Urology;  Laterality: Left;   FOREIGN BODY REMOVAL Left    knee-as child   KNEE ARTHROSCOPY     NASAL SINUS SURGERY N/A 04/13/2015   Procedure: nasal endoscopy with adenoid biopsy;  Surgeon: Littie Rife, MD;  Location: [facility] OR;  Service: ENT;  Laterality: N/A;   NECK SURGERY  2016   x 3 all together   POSTERIOR CERVICAL FUSION/FORAMINOTOMY N/A 11/13/2016   Procedure: CERVICAL TWO-CERVICAL SIX POSTERIOR CERVICAL FUSION WITH LATERAL MASS FIXATION;  Surgeon: Elna Haggis, MD;  Location: [facility] OR;  Service: Neurosurgery;  Laterality: N/A;  posterior approach   REDUCTION MAMMAPLASTY  10/2019   ROTATOR CUFF REPAIR Bilateral 2014   SHOULDER ACROMIOPLASTY Left 05/18/2015   Procedure: SHOULDER ACROMIOPLASTY;  Surgeon: Marlena Sima, MD;  Location: [facility];  Service: Orthopedics;  Laterality: Left;   SHOULDER ARTHROSCOPY WITH DISTAL CLAVICLE RESECTION Left 05/18/2015   Procedure: LEFT SHOULDER ARTHROSCOPY WITH  DISTAL CLAVICLE RESECTION;  Surgeon: Marlena Sima, MD;  Location: [facility];  Service: Orthopedics;  Laterality: Left;   URETEROSCOPY Bilateral 09/26/2021   Procedure: URETEROSCOPY;  Surgeon: Geraline Knapp, MD;  Location: [facility] ORS;  Service: Urology;  Laterality: Bilateral;   XI ROBOTIC ASSISTED OOPHORECTOMY N/A 12/22/2021   Procedure: XI ROBOTIC ASSISTED DIAGNOSTIC LAPAROSCOPY, LEFT OOPHORECTOMY, WITH RIGHT SALPINGECTOMY AND LYSIS OF ADHESIONS;  Surgeon: Prescilla Brod, MD;  Location: [facility] ORS;  Service: Gynecology;  Laterality: N/A;   Patient Active Problem List   Diagnosis Date Noted   Bilateral occipital neuralgia 11/20/2023   Retroperitoneal lymphadenopathy 08/13/2021   Left lower  quadrant abdominal pain 08/11/2021   Occipital headache 12/29/2019   Cervical myofascial pain syndrome 09/22/2019   Recurrent herpes simplex 07/16/2018   Pelvic pain in female 05/29/2018   Chronic pain syndrome 04/17/2018   Cervicalgia 04/17/2018   Medication management 04/17/2018   Cervical fusion syndrome 03/24/2018   Hx of cervical spine surgery 03/24/2018   Depression, major, recurrent, mild (HCC) 11/21/2017   Poorly-controlled hypertension 11/21/2017   Depression, major, single episode, complete remission (HCC) 10/24/2017   Degenerative disc disease, cervical 08/28/2017   Vasovagal syncope 08/28/2017   Dizziness 08/22/2017   History of recent fall 08/22/2017   Hypothyroidism (acquired) 08/13/2017   Loss of memory 04/30/2017   Reaction to severe stress 02/14/2017   Pseudoarthrosis of cervical spine (HCC) 11/13/2016   Asthma 07/05/2016   Acute chest wall pain 07/05/2016   GERD (gastroesophageal reflux disease) 07/05/2016   Spontaneous pneumothorax    Medication overuse headache 04/19/2016   Chronic tension-type headache, not intractable 04/19/2016   Numbness and tingling 04/19/2016   Dysphagia 03/07/2016   Vitamin D deficiency 02/07/2016   Bilateral hand pain 02/06/2016  Cervical stenosis of spinal canal 08/06/2014   Muscle weakness (generalized) 11/12/2013   Tight fascia 11/12/2013   Decreased range of motion of shoulder 11/12/2013   Cervical spondylosis without myelopathy 10/29/2013   S/P carpal tunnel release 02/19/2013   CTS (carpal tunnel syndrome) 02/19/2013   SHOULDER PAIN 10/06/2007   IMPINGEMENT SYNDROME 10/06/2007   RUPTURE ROTATOR CUFF 10/06/2007   HTN (hypertension) 10/03/2007    PCP: Jerone Moorman   REFERRING PROVIDER: Tish Forge DIAG: Pelvic pain Syndrome    Rationale for Evaluation and Treatment Rehabilitation  THERAPY DIAG: Pelvic pain   Repeated falls   Neck pain   Sacrococcygeal disorders, not elsewhere classified  ONSET DATE:    SUBJECTIVE:       SUBJECTIVE STATEMENT TODAY  Pt reported pt reported her neck pain was a 4/0 after Tx last week, but at night the pain was 8/10 at the evening .  Pt took pain meds and laid down which eased the pain some. The pain was still at 8/10 in the morning. There was stiffness with turning neck B.    Pain is not radiating down arms.  Pt had returned home after the PT session and sat on couch with feet on the ground ( not slouched) and watched TV for 3 hours.   Pain today is 7/10. Pain occurs with sweeping the floor.    L hip pain is good as long she takes [place].                                                                                                 SUBJECTIVE STATEMENT ON EVAL 10/07/23 : 1) constant  pelvic pain:  Pt felt pain inside the vagina for the past 2 years. Gynecologist provided her cream to put inside. Pt applies it every other day and it relieves it by 1/10 but pain is still constant. Aaron Aas "Excruciatingly and "like a mm spasm" "  6/10     Pt had pain during pelvic exam.  Denied burning with urinating / bowel movement. Denied noticing any bulge.  Pt is able to start and complete urination without difficulty.    2)  neck pain:  8/10  constant. Pt is planning to get pain medication after today's visit   3)  repeated falls: see ( pertinent Hx below)   4) L hip pain: started out of nowhere. One day she was sweeping. Pain feels "something is lying top of it) . Denied radiating pain. Worse with sweeping, bending.  7/10    PERTINENT HISTORY:               Frequent Falls: Has patient fallen in last 6 months? 3   (6 months ago due to food poisoning for fast food:  pt hit the floor and was unconscious and had a "big old knot on her forehead")   ( Jan/Feb 1.5 months ago: 2 falls in a row due to  food poisioning from fast food  and fell with injury to head to the floor, she fell unconscious and she woke on her own. She got up and felt sick  again, got up on her stool, and  then woke up again on the floor. PCP know about it)   Dizzy spells came on around 1.5 month after her falls that occurred back to back  ( see below under Falls Hx). Pt gets the dizziness once she is up and standing but not with turning her head. Pt felt dizziness 3 different times.  "I feel woozy".  Pt drives but she had not been driving when she feels dizzy.  Yesterday, pt was getting up from the toilet and she felt dizzy and she had to lean on both sides against the walls to not fall.  Pt sits down for 10-15 min and she feels better.   Blood in her urine for  3-4 years ago and"they did not give me anything to clear it up."  Urine is cloudy. Pt drinks water  4-5 (8 z bottles) per day. Pt has blood in her urine with every trip to the toilet. "It is like a light orange".  Denied blood in her underwear.   Pt eats only one  full meal once a day. Pt said during session today, "I feel dehydrated". Water  was provided to pt.  Pt started to eat one meal day for the past 3 years because she heard to eat only one meal to not gain weight.   Chest pain/ mm spasms: for a 5 years. Pt has been on medication .  Pt had mm spasms on her L side. Pt went to the [facility] a week ago with mm spasms and  excruciating pain. Pt went through test which meg. Pt was prescribed medications for the mm spasms but she plans to pick them up today.   SOB: started a  month ago. Walking to the mailbox and coming back.     Pt is scheduled for CT scan after today's visit.   Medical Hx:  C section Abdominal hysterectomy Knee arthroscopy  L  Rotator cuff repair  B   Neck surgery  Shoulder arthroscopy with distal clavicle resection    Xi robotic assisted oophorectomy    PAIN: Are you having pain? Yes: see above   PRECAUTIONS: No   WEIGHT BEARING RESTRICTIONS: No  FALLS:  Has patient fallen in last 6 months? 3   (6 months ago due to food poisoning for fast food:  pt hit the floor and was unconscious and had a  "big old knot on her forehead")   ( Jan/Feb 1.5 months ago: 2 falls in a row due to  food poisioning from fast food  and fell with injury to head to the floor, she fell unconscious and she woke on her own. She got up and felt sick again, got up on her stool, and then woke up again on the floor. PCP know about it)  Pt is scheduled for CT scan after today's visit.   LIVING ENVIRONMENT: Lives with: alone but her son comes and goes weekly  Lives in:  house Stairs: 4 STE in front, 5 back door, with rail, one story house  Has following equipment at home: no  OCCUPATION: disabled    PLOF: pt 's husband died 6 months ago.   PATIENT GOALS:     OBJECTIVE:   .  [facility] PT Assessment - 12/02/23 1121       Observation/Other Assessments   Observations less upper trap tightness / elevation      Other:   Other/ Comments hyperextnded knees in standing marching activity,  wide BOS  Palpation   SI assessment  levelled pelvis, L shoulder lowered             [facility] Adult PT Treatment/Exercise - 12/02/23 1143       Self-Care   Other Self-Care Comments  . Advised pt to decrease TV watching from 3 hours to 1 hour to minimize stiffness of neck. Advised pt to turn off TV 2 hours before bed, pt voiced she will use time for bible study and gentle stretches before bed as a wind down routine.      Therapeutic Activites    Other Therapeutic Activities added stretches for neck and standing marches to perform during commerical breaks      Neuro Re-ed    Neuro Re-ed Details  cued for seated neck ROM and spinal side bend and rotation exercises to perform during commerical breaks,  standing marches with cues for hyperextended knees                HOME EXERCISE PROGRAM: See pt instruction section    ASSESSMENT:  CLINICAL IMPRESSION:  Improvements:    Shoe lift  in toe box and heel in L shoe levelled pelvis Tailbone no longer flexed and pt reported she was able to have bowel movements  right after last session. Pt has had 2 bowel movements per day which is her normal.   At Visit 5, neck pain was 2/10  and today is  4/ 10 pain on and off  but not at constant  8/10 like in the past   Pt reported no increased pain after last session until the evening after watching TV for 3 hours.  Advised pt to decrease TV watching from 3 hours to 1 hour to minimize stiffness of neck. Advised pt to turn off TV 2 hours before bed, pt voiced she will use time for bible study and gentle stretches before bed as a wind down routine. Cued for seated neck ROM and spinal side bend and rotation exercises to perform during commerical breaks,  standing marches with cues for hyperextended knees.  This HEP will help with restoring propioception and standing balance.   Withheld manual Tx today in order to educate pt on self-management of tightness of mm and ensure range of motion practices and minimize TV watching which causes stiffness of neck.   Plan to continue to address neck limitations which will also help with upright posture and IAP system for minimizing not just neck pain but also  fall risks and pelvic pain.     Regional interdependent approaches will yield greater benefits in pt's POC.      Anticipate today's improvements will help to promote optimize IAP system for improved pelvic floor function, trunk stability, gait, balance, stabilization with mobility tasks.  Plan to address pelvic floor issues once pelvis and spine are realigned to yield better outcomes.     Pt benefits from skilled PT.       OBJECTIVE IMPAIRMENTS decreased activity tolerance, decreased coordination, decreased endurance, decreased mobility, difficulty walking, decreased ROM, decreased strength, decreased safety awareness, hypomobility, increased muscle spasms, impaired flexibility, improper body mechanics, postural dysfunction, and pain. scar restrictions   ACTIVITY LIMITATIONS  self-care, , home chores, work tasks     PARTICIPATION LIMITATIONS:  community  PERSONAL FACTORS  See pt additional medical Sx are also affecting patient's functional outcome.  Pt need medical work up   [facility] POTENTIAL: Good   CLINICAL DECISION MAKING: Evolving/moderate complexity   EVALUATION COMPLEXITY: Moderate    PATIENT  EDUCATION:    Education details: Showed pt anatomy images. Explained muscles attachments/ connection, physiology of deep core system/ spinal- thoracic-pelvis-lower kinetic chain as they relate to pt's presentation, Sx, and past Hx. Explained what and how these areas of deficits need to be restored to balance and function    See Therapeutic activity / neuromuscular re-education section  Answered pt's questions.   Person educated: Patient Education method: Explanation, Demonstration, Tactile cues, Verbal cues, and Handouts Education comprehension: verbalized understanding, returned demonstration, verbal cues required, tactile cues required, and needs further education     PLAN: PT FREQUENCY: 1x/week   PT DURATION: 10 weeks   PLANNED INTERVENTIONS: Therapeutic exercises, Therapeutic activity, Neuromuscular re-education, Balance training, Gait training, Patient/Family education, Self Care, Joint mobilization, Spinal mobilization, Moist heat, Taping, and Manual therapy, dry needling.   PLAN FOR NEXT SESSION: See clinical impression for plan     GOALS: Goals reviewed with patient? Yes  SHORT TERM GOALS: Target date: 11/04/2023    Pt will demo IND with HEP                    Baseline: Not IND            Goal status: INITIAL   LONG TERM GOALS: Target date: 12/16/2023    1.Pt will demo proper deep core coordination without chest breathing and optimal excursion of diaphragm/pelvic floor in order to promote spinal stability and pelvic floor function  Baseline: dyscoordination Goal status: INITIAL  2.  Pt will demo proper body mechanics in against gravity tasks and ADLs  work tasks, fitness  to  minimize straining pelvic floor / back    Baseline: not IND, improper form that places strain on pelvic floor  Goal status: INITIAL    3. Pt will demo increased gait speed > 1.3 m/s with reciprocal gait pattern, longer stride length  in order to ambulate safely in community and return to fitness routine  Baseline:  1.06 m/s, R hip hike,  Goal status: INITIAL    4. Pt will demo levelled pelvic girdle and shoulder height in order to progress to deep core strengthening HEP and restore mobility at spine, pelvis, gait, posture minimize falls, and improve balance   Baseline: R shoulder and iliac crest higher than L  Goal status: INITIAL   5. Pt will be referred to gait training, falls prevention training PT to minimize falls  Baseline: frequent falls  Goal status: INITIAL      Modesto Andreas, PT 12/02/2023, 11:17 AM

## 2023-12-09 ENCOUNTER — Ambulatory Visit: Payer: 59 | Admitting: Physical Therapy

## 2023-12-27 IMAGING — CT CT ABD-PELV W/ CM
2 of 5 series · 14 of 46 positions shown, 16 images · IV contrast (APPLIED)
Comparison: Noncontrast CT 03/08/2021

CLINICAL DATA: Left lower quadrant abdominal pain.  Nausea.

EXAM:
CT ABDOMEN AND PELVIS WITH CONTRAST
TECHNIQUE: Multidetector CT imaging of the abdomen and pelvis was performed
using the standard protocol following bolus administration of
intravenous contrast.

[Series 2: routine abd/pel with · axial · 0.63mm/px · z∈[-859,-499]mm · 11 of 82 slices shown, 13 images]
[im 5/82  soft-tissue]
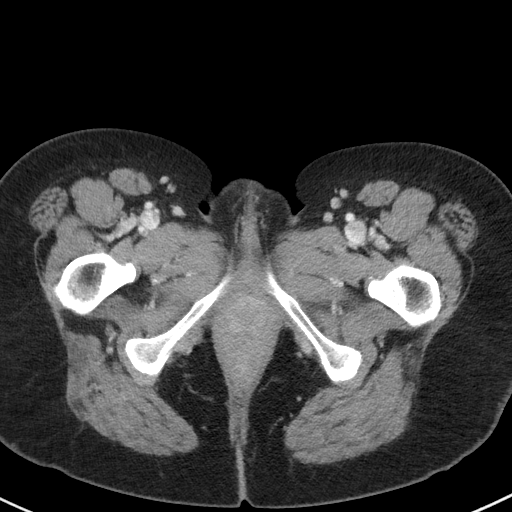
[im 5/82  bone]
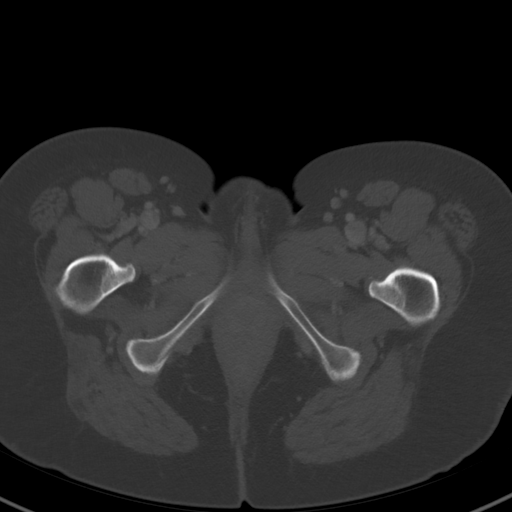
[im 15/82  soft-tissue]
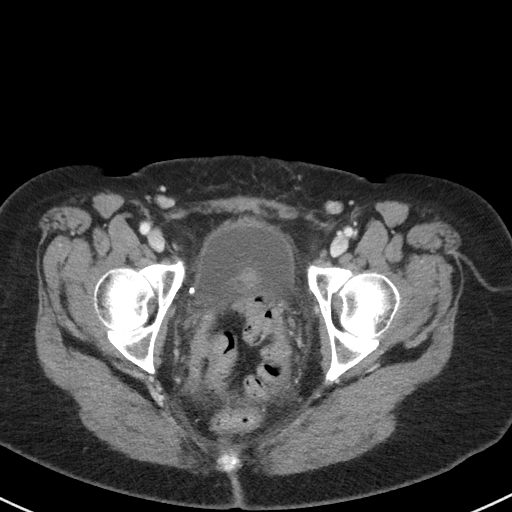
[im 20/82  soft-tissue]
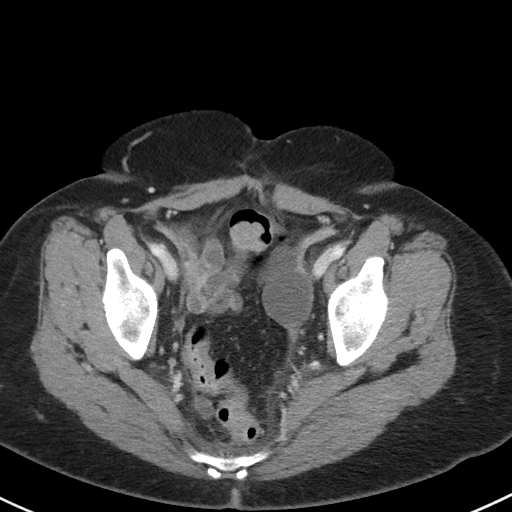
[im 29/82  soft-tissue]
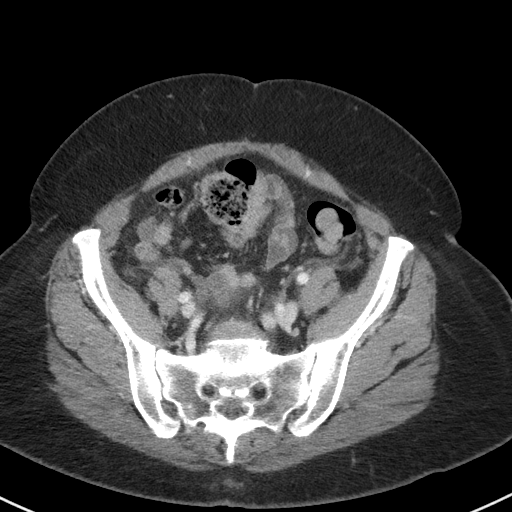
[im 34/82  soft-tissue]
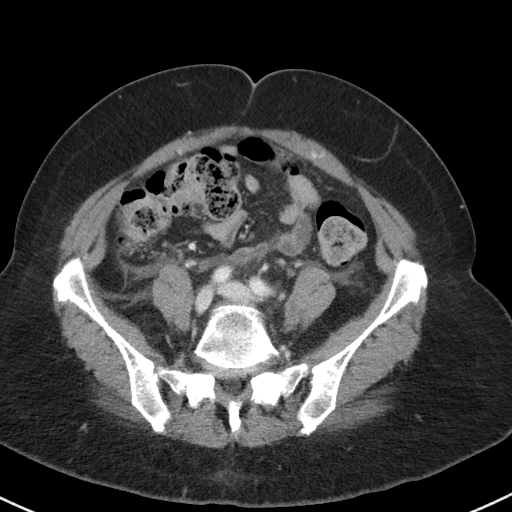
[im 43/82  soft-tissue]
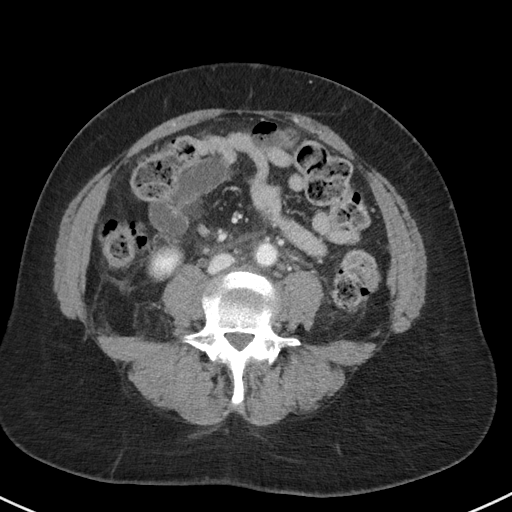
[im 48/82  soft-tissue]
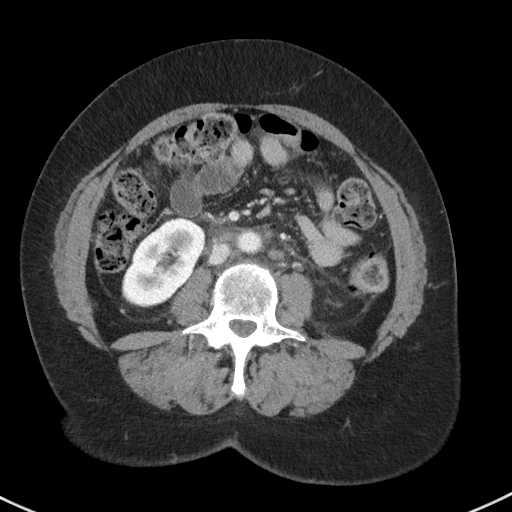
[im 53/82  soft-tissue]
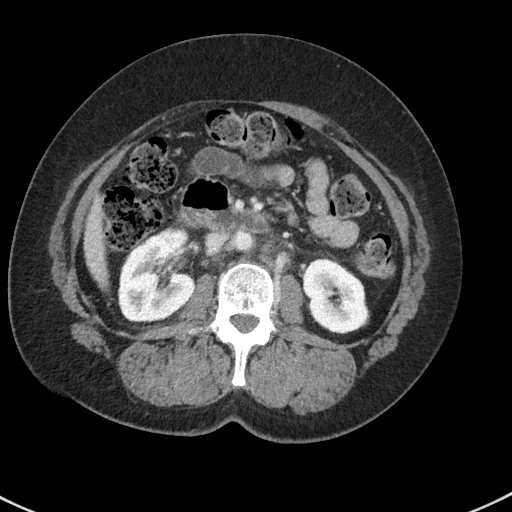
[im 62/82  soft-tissue]
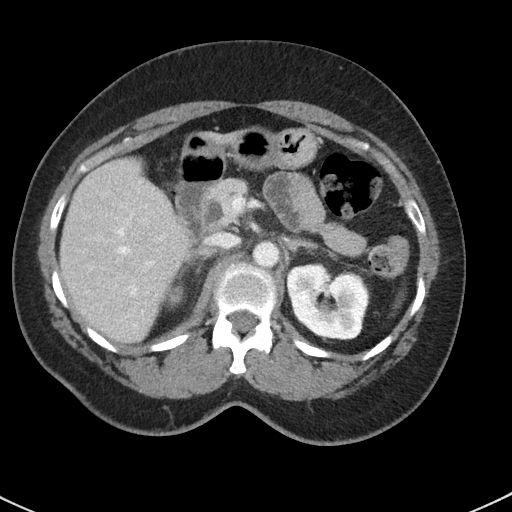
[im 62/82  bone]
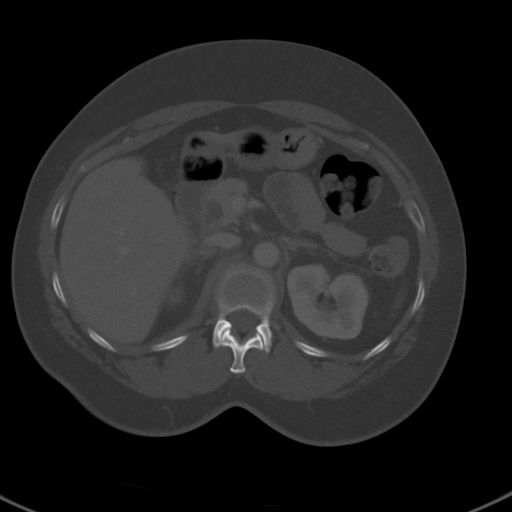
[im 67/82  soft-tissue]
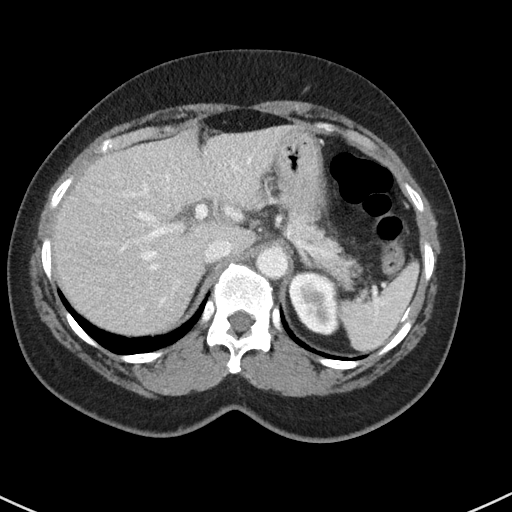
[im 77/82  soft-tissue]
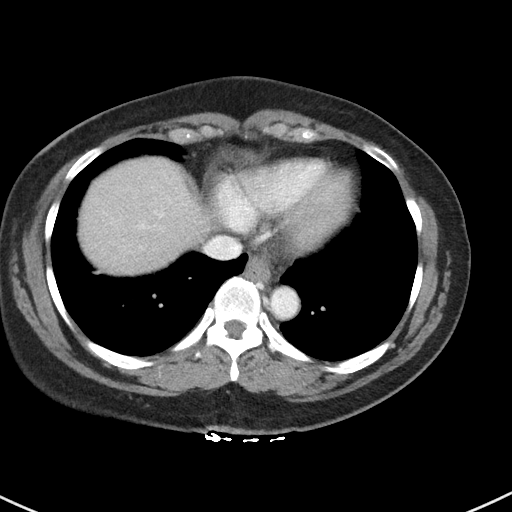

[Series 5: coronal st · coronal · 0.74mm/px · 3 of 96 slices shown]
[im 32/96  soft-tissue]
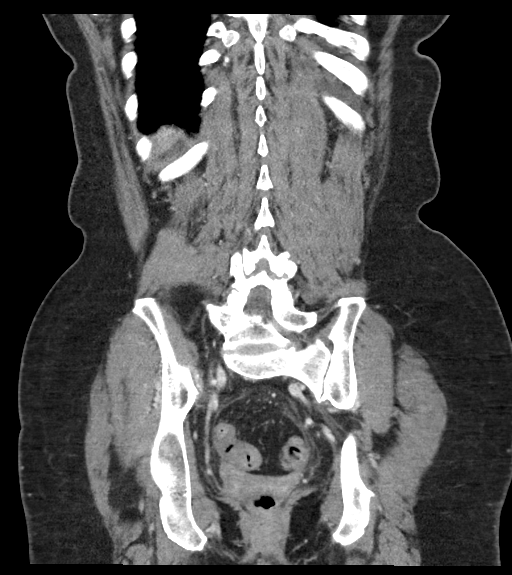
[im 43/96  soft-tissue]
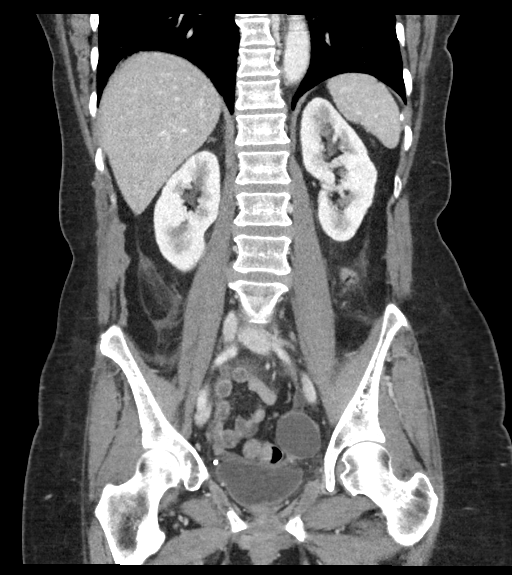
[im 53/96  soft-tissue]
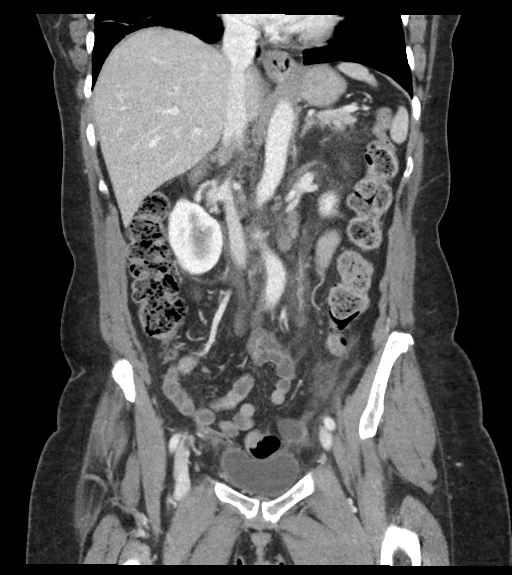

[14 of 46 positions shown; findings below may reference images not displayed]

RADIATION DOSE REDUCTION: This exam was performed according to the
departmental dose-optimization program which includes automated
exposure control, adjustment of the mA and/or kV according to
patient size and/or use of iterative reconstruction technique.

CONTRAST:  100mL OMNIPAQUE IOHEXOL 300 MG/ML  SOLN
FINDINGS: Lower chest: Subsegmental atelectasis in both lower lobes. No
pleural effusion. Small hiatal hernia.

Hepatobiliary: No focal hepatic abnormality. Cholecystectomy.
Proximal common bile duct measures 9 mm, normal for
postcholecystectomy state. No visualized choledocholithiasis.

Pancreas: There is retroperitoneal stranding but this not appear
centered on the pancreas. No evidence of pancreatic inflammation. No
pancreatic ductal dilatation or visualized pancreatic mass.

Spleen: Normal in size without focal abnormality.

Adrenals/Urinary Tract: Normal adrenal glands. No hydronephrosis or
perinephric edema. Homogeneous renal enhancement with symmetric
excretion on delayed phase imaging. No visualized renal calculi. No
focal renal lesion. Urinary bladder is partially distended without
wall thickening. There is mild perivesicular fat stranding.

Stomach/Bowel: Small hiatal hernia. Decompressed stomach. Small
amount of fluid in the duodenum without definite wall thickening.
There is mild retroperitoneal edema, but is not appear centered on
the duodenum. Occasional fluid-filled loops of small bowel in the
central abdomen. No bowel dilatation or obstruction. Occasional
small bowel enhancement in the right lower quadrant bowel loops. No
small bowel inflammation. Normal appendix. Moderate colonic stool
burden with colonic tortuosity. No significant diverticular disease.

Vascular/Lymphatic: There is generalized retroperitoneal stranding
most prominent at the level of the lower right kidney to the iliac
bifurcation. The abdominal aorta is normal in caliber, mild aortic
tortuosity. There is no definite aortic thickening, although patient
motion artifact limits detailed assessment. IVC and iliac veins are
patent. Portal and splenic veins are patent. There is shotty
retroperitoneal and central mesenteric adenopathy.

Reproductive: Hysterectomy. Septated versus 2 adnexal cysts on the
left measures 4.9 x 3.2 cm, chronic and not significantly changed
from prior exam allowing for differences in caliper placement. There
may be mild stranding adjacent to the ovary. 1.8 cm fluid density
structure in the right pelvis was not seen on prior exams.

Other: Generalized retroperitoneal stranding, with stranding
tracking into both pericolic gutters and the pelvis. Source of
inflammation is difficult to delineate. Trace free fluid in the
pelvis. No free air. Tiny fat containing umbilical hernia

Musculoskeletal: There are no acute or suspicious osseous
abnormalities. No evidence of iliopsoas or intramuscular collection.
IMPRESSION: 1. Generalized retroperitoneal stranding and inflammation, most
prominent at the level of the lower right kidney to the iliac
bifurcation. Source of inflammation is difficult to delineate.
Differential considerations include primary retroperitoneal
fibrosis, unspecified vasculitis, or reactive inflammation related
to the primary bowel process, although no definite focal inflamed
bowel process is seen as source. Consider short interval follow-up
CT to assess for change, consider administration of enteric contrast
for better bowel assessment.
2. Septated versus 2 adnexal cysts on the left measures 4.9 x
cm, chronic and not significantly changed from prior 0606 exam
allowing for differences in caliper placement, consistent with
benign process.
3. Small hiatal hernia.
4. Moderate colonic stool burden with colonic tortuosity, suggesting
constipation.
5. Shotty retroperitoneal and central mesenteric adenopathy is
likely reactive.

## 2023-12-31 ENCOUNTER — Other Ambulatory Visit: Payer: Self-pay | Admitting: Neurology

## 2023-12-31 DIAGNOSIS — R42 Dizziness and giddiness: Secondary | ICD-10-CM

## 2023-12-31 DIAGNOSIS — R202 Paresthesia of skin: Secondary | ICD-10-CM

## 2023-12-31 DIAGNOSIS — R413 Other amnesia: Secondary | ICD-10-CM

## 2023-12-31 DIAGNOSIS — R2689 Other abnormalities of gait and mobility: Secondary | ICD-10-CM

## 2023-12-31 DIAGNOSIS — R55 Syncope and collapse: Secondary | ICD-10-CM

## 2024-01-01 ENCOUNTER — Ambulatory Visit
Admission: RE | Admit: 2024-01-01 | Discharge: 2024-01-01 | Disposition: A | Discharge status: Home / Self Care | Source: Ambulatory Visit | Attending: Neurology | Admitting: Neurology

## 2024-01-01 DIAGNOSIS — R413 Other amnesia: Secondary | ICD-10-CM | POA: Diagnosis present

## 2024-01-01 DIAGNOSIS — R200 Anesthesia of skin: Secondary | ICD-10-CM | POA: Insufficient documentation

## 2024-01-01 DIAGNOSIS — R55 Syncope and collapse: Secondary | ICD-10-CM | POA: Diagnosis present

## 2024-01-01 DIAGNOSIS — R42 Dizziness and giddiness: Secondary | ICD-10-CM | POA: Diagnosis present

## 2024-01-01 DIAGNOSIS — R202 Paresthesia of skin: Secondary | ICD-10-CM | POA: Insufficient documentation

## 2024-01-01 DIAGNOSIS — R2689 Other abnormalities of gait and mobility: Secondary | ICD-10-CM | POA: Insufficient documentation

## 2024-01-11 IMAGING — CT CT ABD-PEL WO/W CM
3 of 12 series · 11 of 46 positions shown, 17 images · IV contrast (agent unspecified)
Comparison: 08/11/2021, 08/04/2018

CLINICAL DATA: Microhematuria, bladder pain and urinary frequency

EXAM:
CT ABDOMEN AND PELVIS WITHOUT AND WITH CONTRAST
TECHNIQUE: Multidetector CT imaging of the abdomen and pelvis was performed
following the standard protocol before and following the bolus
administration of intravenous contrast.

[Series 2: abd without pre 5.00 · axial · non-contrast · 0.62mm/px · z∈[-1399,-1339]mm · 2 of 87 slices shown]
[im 13/87  soft-tissue]
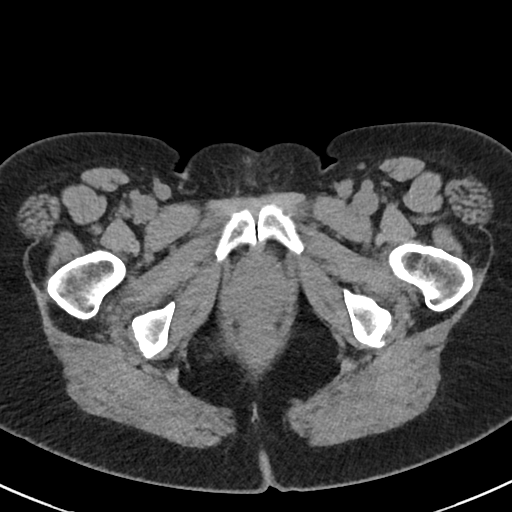
[im 25/87  soft-tissue]
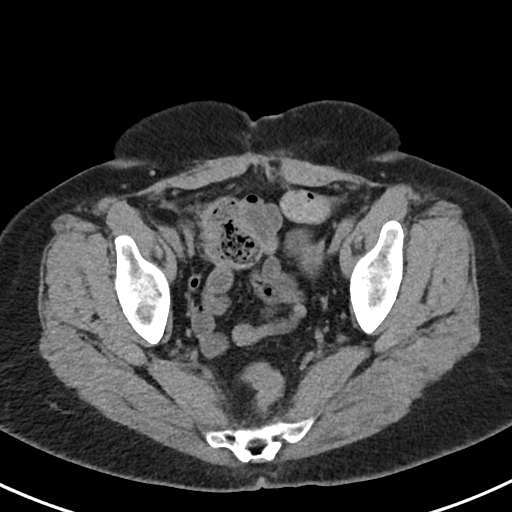

[Series 5: cor without without pre 2.00 cor · coronal · non-contrast · 0.62mm/px · 2 of 147 slices shown, 3 images]
[im 49/147  soft-tissue]
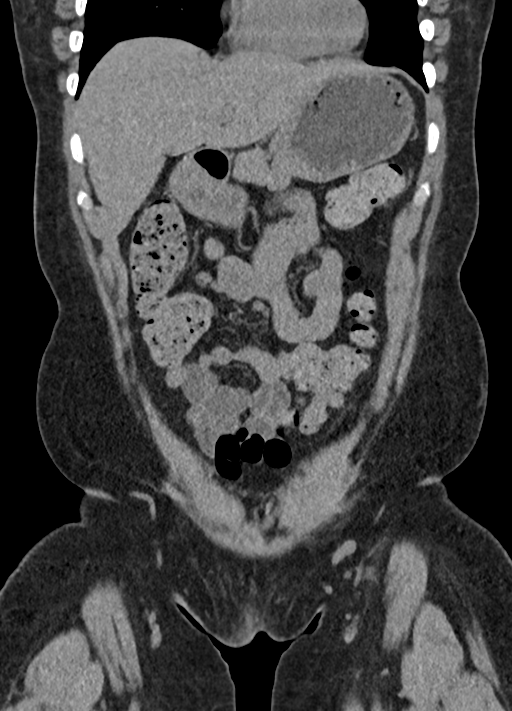
[im 49/147  bone]
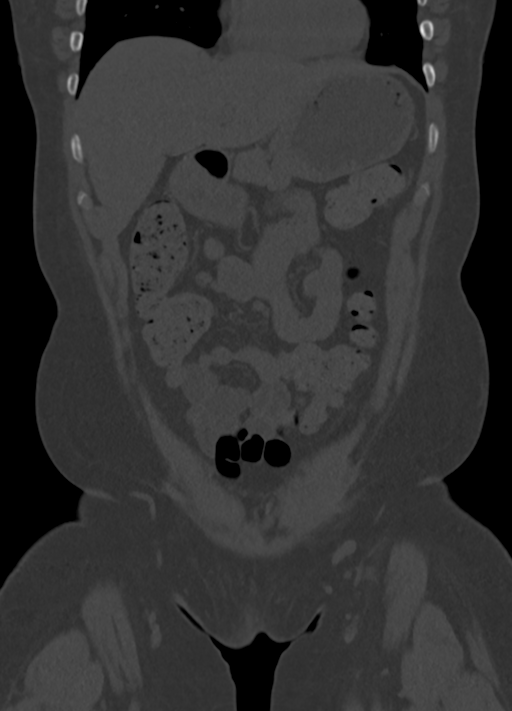
[im 98/147  soft-tissue]
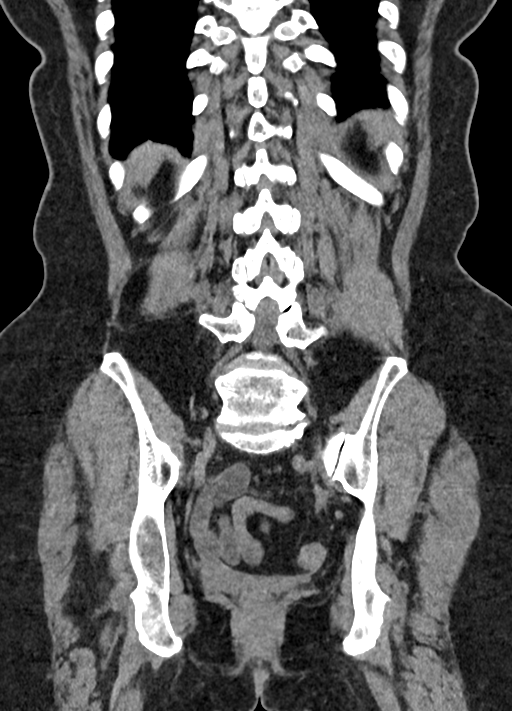

[Series 17: axial delay delay prone 5.00 · axial · delayed · 0.62mm/px · z∈[-1327,-982]mm · 7 of 93 slices shown, 12 images]
[im 12/93  soft-tissue]
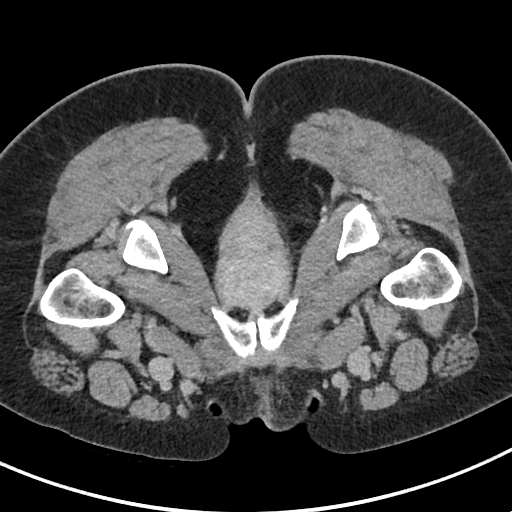
[im 12/93  bone]
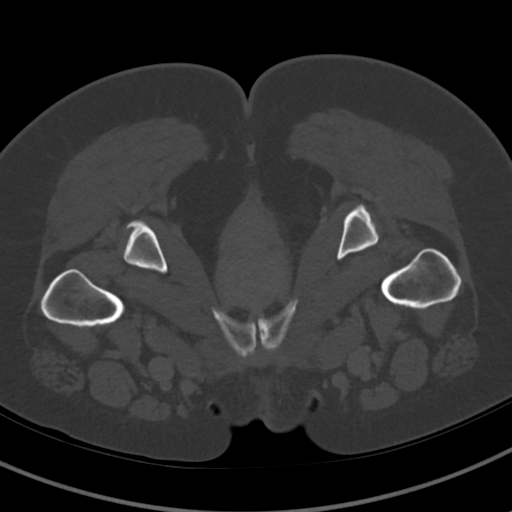
[im 24/93  soft-tissue]
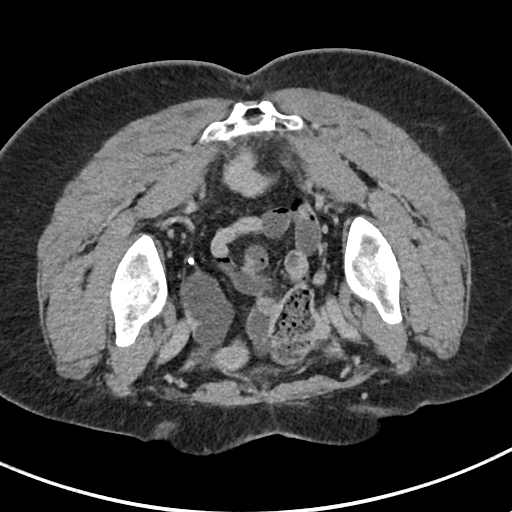
[im 35/93  soft-tissue]
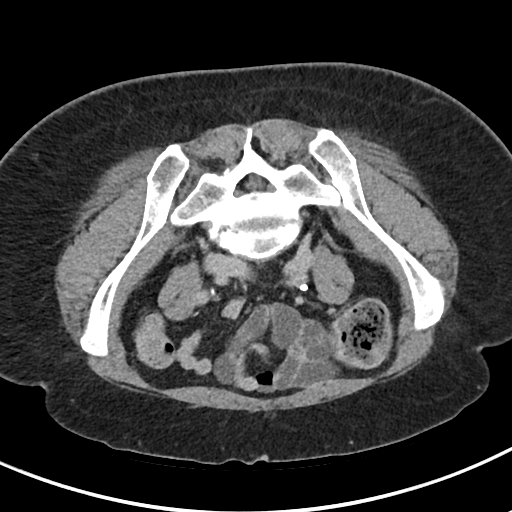
[im 47/93  soft-tissue]
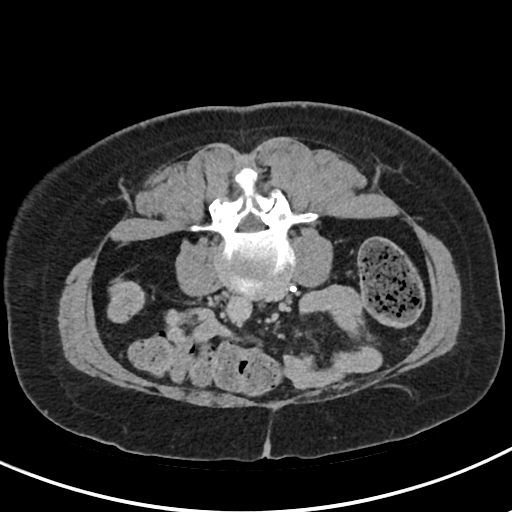
[im 47/93  lung]
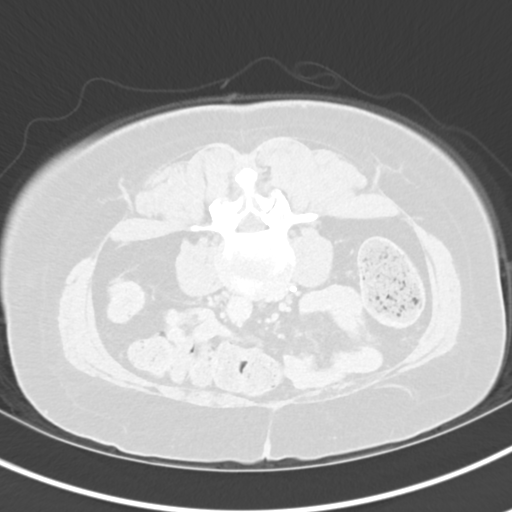
[im 58/93  soft-tissue]
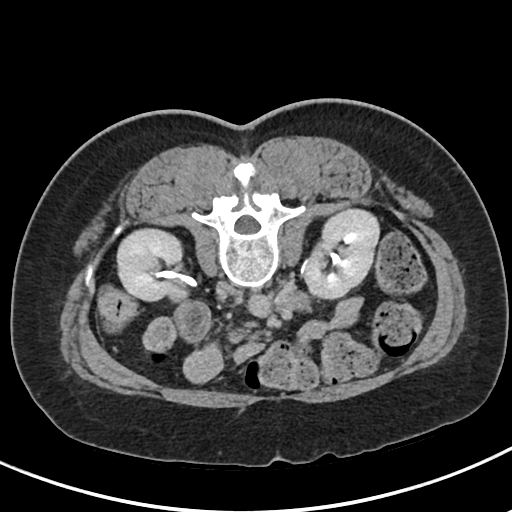
[im 58/93  lung]
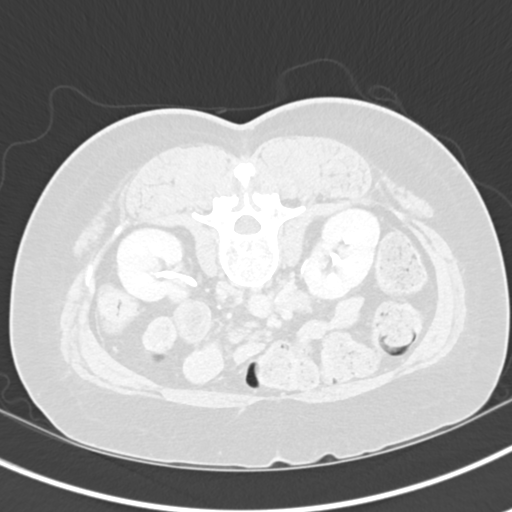
[im 70/93  soft-tissue]
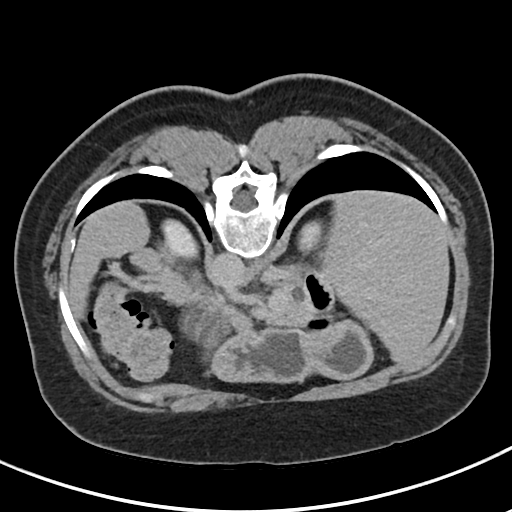
[im 70/93  lung]
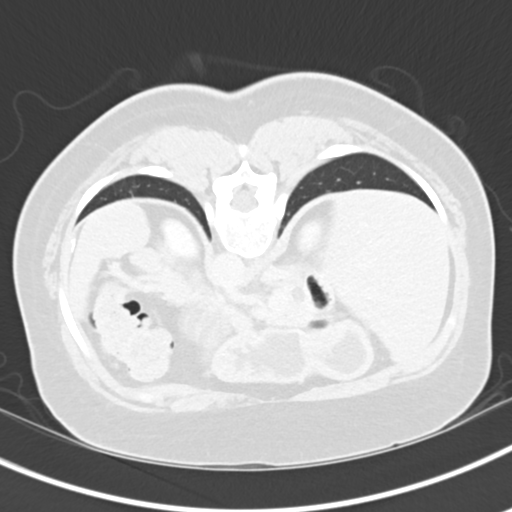
[im 81/93  soft-tissue]
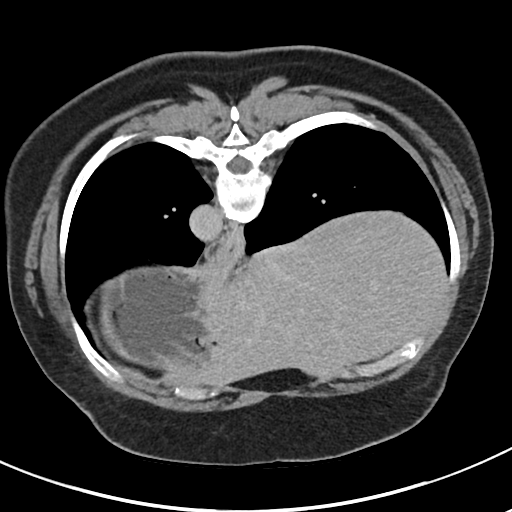
[im 81/93  lung]
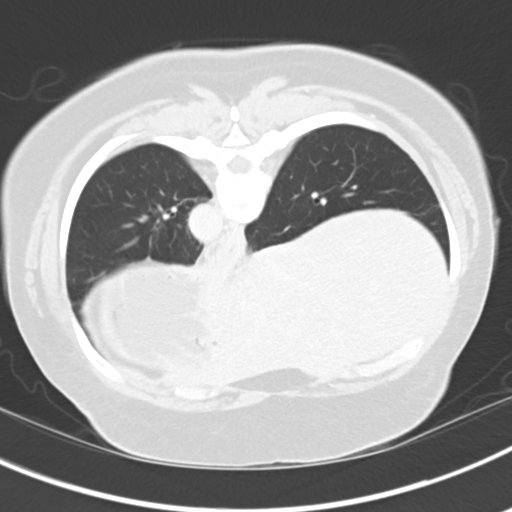

[11 of 46 positions shown; findings below may reference images not displayed]

RADIATION DOSE REDUCTION: This exam was performed according to the
departmental dose-optimization program which includes automated
exposure control, adjustment of the mA and/or kV according to
patient size and/or use of iterative reconstruction technique.

CONTRAST:  85mL OMNIPAQUE IOHEXOL 350 MG/ML SOLN
FINDINGS: Lower chest: No acute abnormality.

Hepatobiliary: No focal liver abnormality is seen. Status post
cholecystectomy. Postoperative biliary dilatation.

Pancreas: Unremarkable. No pancreatic ductal dilatation or
surrounding inflammatory changes.

Spleen: Normal in size without significant abnormality.

Adrenals/Urinary Tract: Adrenal glands are unremarkable. Punctuate
nonobstructive calculus of the superior pole of the left kidney. No
right-sided calculi, ureteral calculi, or hydronephrosis. Limited
opacification of the distal right ureter and proximal left ureter.
Within this limitation, no evidence of urinary tract filling defect
on delayed phase imaging. Bladder is unremarkable.

Stomach/Bowel: Stomach is within normal limits. Appendix appears
normal. No evidence of bowel wall thickening, distention, or
inflammatory changes. Large burden of stool throughout the colon.

Vascular/Lymphatic: No significant vascular findings are present.
Unchanged subcentimeter retroperitoneal lymph nodes and mild
retroperitoneal fat stranding (series 9, image 33).

Reproductive: Status post hysterectomy. Unchanged, thinly septated
left adnexal cyst measuring 4.8 x 2.9 cm, stable in comparison to
examination dated 08/04/2018 and benign (series 9, image 65).

Other: No abdominal wall hernia or abnormality. No ascites.

Musculoskeletal: No acute or significant osseous findings.
IMPRESSION: 1. Punctuate nonobstructive calculus of the superior pole of the
left kidney. No right-sided calculi, ureteral calculi, or
hydronephrosis.
2. No evidence of urinary tract mass or suspicious contrast
enhancement.
3. Limited opacification of the distal right ureter and proximal
left ureter. Within this limitation, no evidence of urinary tract
filling defect on delayed phase imaging.
4. No abnormality of the urinary bladder to explain bladder pain or
urinary frequency.

## 2024-01-21 ENCOUNTER — Ambulatory Visit: Attending: Obstetrics and Gynecology | Admitting: Physical Therapy

## 2024-01-29 ENCOUNTER — Ambulatory Visit: Admitting: Physical Therapy

## 2024-02-09 ENCOUNTER — Other Ambulatory Visit: Payer: Self-pay

## 2024-02-09 ENCOUNTER — Emergency Department
Admission: EM | Admit: 2024-02-09 | Discharge: 2024-02-09 | Disposition: A | Discharge status: Home / Self Care | Attending: Emergency Medicine | Admitting: Emergency Medicine

## 2024-02-09 DIAGNOSIS — K529 Noninfective gastroenteritis and colitis, unspecified: Secondary | ICD-10-CM | POA: Diagnosis not present

## 2024-02-09 DIAGNOSIS — D72829 Elevated white blood cell count, unspecified: Secondary | ICD-10-CM | POA: Diagnosis not present

## 2024-02-09 DIAGNOSIS — R112 Nausea with vomiting, unspecified: Secondary | ICD-10-CM

## 2024-02-09 DIAGNOSIS — R509 Fever, unspecified: Secondary | ICD-10-CM | POA: Diagnosis not present

## 2024-02-09 DIAGNOSIS — I10 Essential (primary) hypertension: Secondary | ICD-10-CM | POA: Insufficient documentation

## 2024-02-09 DIAGNOSIS — J45909 Unspecified asthma, uncomplicated: Secondary | ICD-10-CM | POA: Diagnosis not present

## 2024-02-09 LAB — URINALYSIS, ROUTINE W REFLEX MICROSCOPIC
Bilirubin Urine: NEGATIVE
Glucose, UA: NEGATIVE mg/dL
Ketones, ur: 5 mg/dL — AB
Leukocytes,Ua: NEGATIVE
Nitrite: NEGATIVE
Protein, ur: 30 mg/dL — AB
Specific Gravity, Urine: 1.012 (ref 1.005–1.030)
pH: 6 (ref 5.0–8.0)

## 2024-02-09 LAB — LIPASE, BLOOD: Lipase: 25 U/L (ref 11–51)

## 2024-02-09 LAB — CBC
HCT: 41.4 % (ref 36.0–46.0)
Hemoglobin: 13.4 g/dL (ref 12.0–15.0)
MCH: 30.4 pg (ref 26.0–34.0)
MCHC: 32.4 g/dL (ref 30.0–36.0)
MCV: 93.9 fL (ref 80.0–100.0)
Platelets: 306 K/uL (ref 150–400)
RBC: 4.41 MIL/uL (ref 3.87–5.11)
RDW: 13.1 % (ref 11.5–15.5)
WBC: 18.5 K/uL — ABNORMAL HIGH (ref 4.0–10.5)
nRBC: 0 % (ref 0.0–0.2)

## 2024-02-09 LAB — RESP PANEL BY RT-PCR (RSV, FLU A&B, COVID)  RVPGX2
Influenza A by PCR: NEGATIVE
Influenza B by PCR: NEGATIVE
Resp Syncytial Virus by PCR: NEGATIVE
SARS Coronavirus 2 by RT PCR: NEGATIVE

## 2024-02-09 LAB — COMPREHENSIVE METABOLIC PANEL WITH GFR
ALT: 13 U/L (ref 0–44)
AST: 22 U/L (ref 15–41)
Albumin: 4 g/dL (ref 3.5–5.0)
Alkaline Phosphatase: 87 U/L (ref 38–126)
Anion gap: 11 (ref 5–15)
BUN: 11 mg/dL (ref 8–23)
CO2: 23 mmol/L (ref 22–32)
Calcium: 9.3 mg/dL (ref 8.9–10.3)
Chloride: 102 mmol/L (ref 98–111)
Creatinine, Ser: 0.77 mg/dL (ref 0.44–1.00)
GFR, Estimated: >60 mL/min (ref >60)
Glucose, Bld: 150 mg/dL — ABNORMAL HIGH (ref 70–99)
Potassium: 3.8 mmol/L (ref 3.5–5.1)
Sodium: 136 mmol/L (ref 135–145)
Total Bilirubin: 1 mg/dL (ref 0.0–1.2)
Total Protein: 8.8 g/dL — ABNORMAL HIGH (ref 6.5–8.1)

## 2024-02-09 MED ORDER — MORPHINE SULFATE (PF) 4 MG/ML IV SOLN
4.0000 mg | Freq: Once | INTRAVENOUS | Status: AC
  Administered 2024-02-09: 4 mg via INTRAVENOUS
  Filled 2024-02-09: qty 1

## 2024-02-09 MED ORDER — ONDANSETRON HCL 4 MG/2ML IJ SOLN
4.0000 mg | Freq: Once | INTRAMUSCULAR | Status: AC
  Administered 2024-02-09: 4 mg via INTRAVENOUS
  Filled 2024-02-09: qty 2

## 2024-02-09 MED ORDER — ONDANSETRON 4 MG PO TBDP: 4.0000 mg | ORAL_TABLET | Freq: Three times a day (TID) | ORAL | 0 refills | Status: AC | PRN

## 2024-02-09 MED ORDER — LACTATED RINGERS IV BOLUS
1000.0000 mL | Freq: Once | INTRAVENOUS | Status: AC
  Administered 2024-02-09: 1000 mL via INTRAVENOUS

## 2024-02-09 MED ORDER — OXYCODONE HCL 5 MG PO TABS
5.0000 mg | ORAL_TABLET | Freq: Once | ORAL | Status: AC
  Administered 2024-02-09: 5 mg via ORAL
  Filled 2024-02-09: qty 1

## 2024-02-09 NOTE — ED Provider Notes (Signed)
 [facility] Provider Note    Event Date/Time   First MD Initiated Contact with Patient 02/09/24 0202     (approximate)   History   Chief Complaint Emesis   HPI  Kimberly [name] is a 63 y.o. female with past medical history of hypertension and asthma who presents to the ED complaining of nausea and vomiting.  Patient reports that she has had approximately 12 hours of nausea, vomiting, diarrhea, and abdominal pain.  She describes some subjective fevers, but has not checked her temperature at home.  She has been unable to keep anything down over the course of the day today, has not noticed any blood in her emesis or stool.  She reports crampy pain in her epigastric area, has not taken anything for this at home.  She does take pain medication chronically for her neck, but was unable to keep this down.     Physical Exam   Triage Vital Signs: ED Triage Vitals [02/09/24 0128]  Encounter Vitals Group     BP (!) 155/90     Girls Systolic BP Percentile      Girls Diastolic BP Percentile      Boys Systolic BP Percentile      Boys Diastolic BP Percentile      Pulse Rate (!) 115     Resp 18     Temp 98.8 F (37.1 C)     Temp Source Oral     SpO2 98 %     Weight      Height 5' 1 (1.549 m)     Head Circumference      Peak Flow      Pain Score 0     Pain Loc      Pain Education      Exclude from Growth Chart     Most recent vital signs: Vitals:   02/09/24 0128 02/09/24 0225  BP: (!) 155/90   Pulse: (!) 115   Resp: 18   Temp: 98.8 F (37.1 C)   SpO2: 98% 98%    Constitutional: Alert and oriented. Eyes: Conjunctivae are normal. Head: Atraumatic. Nose: No congestion/rhinnorhea. Mouth/Throat: Mucous membranes are moist.  Cardiovascular: Normal rate, regular rhythm. Grossly normal heart sounds.  2+ radial pulses bilaterally. Respiratory: Normal respiratory effort.  No retractions. Lungs CTAB. Gastrointestinal: Soft and nontender. No  distention. Musculoskeletal: No lower extremity tenderness nor edema.  Neurologic:  Normal speech and language. No gross focal neurologic deficits are appreciated.    ED Results / Procedures / Treatments   Labs (all labs ordered are listed, but only abnormal results are displayed) Labs Reviewed  COMPREHENSIVE METABOLIC PANEL WITH GFR - Abnormal; Notable for the following components:      Result Value   Glucose, Bld 150 (*)    Total Protein 8.8 (*)    All other components within normal limits  CBC - Abnormal; Notable for the following components:   WBC 18.5 (*)    All other components within normal limits  URINALYSIS, ROUTINE W REFLEX MICROSCOPIC - Abnormal; Notable for the following components:   Color, Urine YELLOW (*)    APPearance CLEAR (*)    Hgb urine dipstick MODERATE (*)    Ketones, ur 5 (*)    Protein, ur 30 (*)    Bacteria, UA MANY (*)    All other components within normal limits  RESP PANEL BY RT-PCR (RSV, FLU A&B, COVID)  [id number]  LIPASE, BLOOD    PROCEDURES:  Critical  Care performed: No  Procedures   MEDICATIONS ORDERED IN ED: Medications  oxyCODONE  (Oxy IR/ROXICODONE ) immediate release tablet 5 mg (has no administration in time range)  ondansetron  (ZOFRAN ) injection 4 mg (4 mg Intravenous Given 02/09/24 0140)  morphine  (PF) 4 MG/ML injection 4 mg (4 mg Intravenous Given 02/09/24 0235)  ondansetron  (ZOFRAN ) injection 4 mg (4 mg Intravenous Given 02/09/24 0235)  lactated ringers  bolus 1,000 mL (1,000 mLs Intravenous New Bag/Given 02/09/24 0301)     IMPRESSION / MDM / ASSESSMENT AND PLAN / ED COURSE  I reviewed the triage vital signs and the nursing notes.                              63 y.o. female with a past medical history of hypertension and asthma who presents to the ED complaining of persistent nausea, vomiting, and diarrhea over the past 12 hours.  Patient's presentation is most consistent with acute presentation with potential threat to life or  bodily function.  Differential diagnosis includes, but is not limited to, gastroenteritis, dehydration, electrolyte abnormality, AKI, gastritis, pancreatitis, hepatitis, cholecystitis, biliary colic.  Patient nontoxic-appearing and in no acute distress, vital signs remarkable for tachycardia but otherwise reassuring.  She has a benign abdominal exam and symptoms seem most consistent with a gastroenteritis.  Labs with leukocytosis but no significant anemia, electrolyte abnormality, or AKI.  LFTs and lipase are unremarkable.  We will treat symptomatically with IV morphine  and Zofran , hydrate with IV fluids and reassess.  Patient symptoms are improved on reassessment and she is tolerating oral intake without difficulty.  She does complain of some ongoing pain around her neck, however this appears to be a chronic issue that she follows with pain management for.  She is appropriate for discharge home with outpatient follow-up, was prescribed Zofran  and counseled to return to the ED for new or worsening symptoms.  Patient agrees with plan.      FINAL CLINICAL IMPRESSION(S) / ED DIAGNOSES   Final diagnoses:  Gastroenteritis  Nausea vomiting and diarrhea     Rx / DC Orders   ED Discharge Orders          Ordered    ondansetron  (ZOFRAN -ODT) 4 MG disintegrating tablet  Every 8 hours PRN        02/09/24 0455             Note:  This document was prepared using Dragon voice recognition software and may include unintentional dictation errors.   Willo Dunnings, MD 02/09/24 [phone]

## 2024-02-09 NOTE — ED Triage Notes (Signed)
 Pt to ed from home via [facility] for N/V/D that started this morning. Per EMS no vomiting during transport, but pt is now dry heaving in triage. Pt is caox4, in no acute distress.

## 2024-02-13 ENCOUNTER — Ambulatory Visit (HOSPITAL_BASED_OUTPATIENT_CLINIC_OR_DEPARTMENT_OTHER): Admitting: Nurse Practitioner

## 2024-02-13 DIAGNOSIS — Z91199 Patient's noncompliance with other medical treatment and regimen due to unspecified reason: Secondary | ICD-10-CM

## 2024-02-13 DIAGNOSIS — G894 Chronic pain syndrome: Secondary | ICD-10-CM

## 2024-02-13 NOTE — Progress Notes (Signed)
 02/13/2024-No show

## 2024-02-17 ENCOUNTER — Telehealth: Payer: Self-pay

## 2024-02-17 ENCOUNTER — Encounter: Payer: Self-pay | Admitting: Nurse Practitioner

## 2024-02-17 ENCOUNTER — Ambulatory Visit: Attending: Nurse Practitioner | Admitting: Nurse Practitioner

## 2024-02-17 VITALS — BP 117/91 | HR 97 | Temp 98.0°F | Ht 61.0 in | Wt 154.0 lb

## 2024-02-17 DIAGNOSIS — M7918 Myalgia, other site: Secondary | ICD-10-CM | POA: Insufficient documentation

## 2024-02-17 DIAGNOSIS — G894 Chronic pain syndrome: Secondary | ICD-10-CM | POA: Diagnosis not present

## 2024-02-17 DIAGNOSIS — Z79899 Other long term (current) drug therapy: Secondary | ICD-10-CM | POA: Insufficient documentation

## 2024-02-17 DIAGNOSIS — Z9889 Other specified postprocedural states: Secondary | ICD-10-CM | POA: Diagnosis present

## 2024-02-17 DIAGNOSIS — Q761 Klippel-Feil syndrome: Secondary | ICD-10-CM | POA: Diagnosis present

## 2024-02-17 DIAGNOSIS — M5481 Occipital neuralgia: Secondary | ICD-10-CM | POA: Diagnosis present

## 2024-02-17 DIAGNOSIS — M47812 Spondylosis without myelopathy or radiculopathy, cervical region: Secondary | ICD-10-CM | POA: Insufficient documentation

## 2024-02-17 MED ORDER — OXYCODONE HCL 5 MG PO TABS: 5.0000 mg | ORAL_TABLET | Freq: Four times a day (QID) | ORAL | 0 refills | Status: DC | PRN

## 2024-02-17 MED ORDER — OXYCODONE HCL 5 MG PO TABS: 5.0000 mg | ORAL_TABLET | Freq: Four times a day (QID) | ORAL | 0 refills | Status: AC | PRN

## 2024-02-17 NOTE — Telephone Encounter (Signed)
 Patient to return for UDS 02/18/24

## 2024-02-17 NOTE — Progress Notes (Signed)
 PROVIDER NOTE: Interpretation of information contained herein should be left to medically-trained personnel. Specific patient instructions are provided elsewhere under Patient Instructions section of medical record. This document was created in part using AI and STT-dictation technology, any transcriptional errors that may result from this process are unintentional.  Patient: Kimberly [name]  Service: E/M   PCP: Alla Amis, MD  DOB: [birth year]  DOS: 02/17/2024  Provider: Emmy MARLA Blanch, NP  MRN: [id number]  Delivery: Face-to-face  Specialty: Interventional Pain Management  Type: Established Patient  Setting: Ambulatory outpatient facility  Specialty designation: 09  Referring Prov.: Alla Amis, MD  Location: Outpatient office facility       History of present illness (HPI) Kimberly [name], a 63 y.o. year old female, is here today because of her Chronic pain syndrome [G89.4]. Kimberly [name] primary complain today is Neck Pain (Radiates to left side of head, and back left of head )  Pertinent problems: Kimberly [name] has Shoulder pain; Impingement syndrome; Rupture of rotator cuff; CTS (Carpal tunnel syndrome); Cervical spondylosis without myelopathy; Cervical stenosis of spinal canal; Pseudoarthritis of cervical spine (HCC); Numbness and tingling; Vitamin D deficiency; Cervical fusion syndrome; Hx of cervical spine surgery; and Chronic pain syndrome on their pertinent problem list.    Pain Assessment: Severity of Chronic pain is reported as a 7 /10. Location: Neck Posterior/Radiates to left side and back of head. Onset: More than a month ago. Quality: [place], [place]. Timing: Constant. Modifying factor(s): Medication helps a little. Vitals:  height is 5' 1 (1.549 m) and weight is 154 lb (69.9 kg). Her oral temperature is 98 F (36.7 C). Her blood pressure is 117/91 (abnormal) and her pulse is 97. Her oxygen saturation is 97%.  BMI: Estimated body mass index is 29.1 kg/m as calculated  from the following:   Height as of this encounter: 5' 1 (1.549 m).   Weight as of this encounter: 154 lb (69.9 kg).  Last encounter: 02/13/2024. Last procedure: Visit date not found.  Reason for encounter: medication management. No change in medical history since last visit.  Patient's pain is at baseline.  Patient continues multimodal pain regimen as prescribed.  States that it provides pain relief and improvement in functional status.   Pharmacotherapy Assessment   Oxycodone  (Oxy IR/oxycodone ) 5 mg immediate release every 6 hours as needed for severe pain. MME=30  Monitoring: [place] PMP: PDMP reviewed during this encounter.       Pharmacotherapy: No side-effects or adverse reactions reported. Compliance: No problems identified. Effectiveness: Clinically acceptable.  Erlene Doyal [place]UN[place], [place]  02/17/2024  9:40 AM  Sign when Signing Visit Nursing Pain Medication Assessment:  Safety precautions to be maintained throughout the outpatient stay will include: orient to surroundings, keep bed in low position, maintain call bell within reach at all times, provide assistance with transfer out of bed and ambulation.  Medication Inspection Compliance: Pill count conducted under aseptic conditions, in front of the patient. Neither the pills nor the bottle was removed from the patient's sight at any time. Once count was completed pills were immediately returned to the patient in their original bottle.  Medication: Oxycodone  IR Pill/Patch Count: 94 of 120 pills/patches remain Pill/Patch Appearance: Markings consistent with prescribed medication Bottle Appearance: Standard pharmacy container. Clearly labeled. Filled Date: 07 / 08 / 2025 Last Medication intake:  Yesterday    UDS:  Summary  Date Value Ref Range Status  03/05/2023 Note  Final    Comment:    ==================================================================== Tox[employer]SU[employer] Select 13  (  MW) ==================================================================== Specimen Alert Not Detected result may be consistent with the time of last use noted for this medication. AS NEEDED (Diazepam ) ==================================================================== Test                             Result       Flag       Units  Drug Present and Declared for Prescription Verification   Oxycodone                       1440         EXPECTED   ng/mg creat   Oxymorphone                    2005         EXPECTED   ng/mg creat   Noroxycodone                   >3135        EXPECTED   ng/mg creat   Noroxymorphone                 780          EXPECTED   ng/mg creat    Sources of oxycodone  are scheduled prescription medications.    Oxymorphone, noroxycodone, and noroxymorphone are expected    metabolites of oxycodone . Oxymorphone is also available as a    scheduled prescription medication.  Drug Absent but Declared for Prescription Verification   Diazepam                        Not Detected UNEXPECTED ng/mg creat ==================================================================== Test                      Result    Flag   Units      Ref Range   Creatinine              319              mg/dL      >=79 ==================================================================== Declared Medications:  The flagging and interpretation on this report are based on the  following declared medications.  Unexpected results may arise from  inaccuracies in the declared medications.   **Note: The testing scope of this panel includes these medications:   Diazepam   Oxycodone  (Roxicodone )   **Note: The testing scope of this panel does not include the  following reported medications:   Acetaminophen  (Tylenol )  Amlodipine  (Norvasc )  Cyclobenzaprine  (Flexeril )  Estradiol  (Estrace )  Estrogens  (Premarin )  Eye Drop  Fluconazole  (Diflucan )  Gabapentin  (Neurontin )  Hydroxyzine  (Atarax )  Hyoscyamine (Uro-MP )   Ibuprofen  (Advil )  Liothyronine  (Cytomel )  Meloxicam  (Mobic )  Methenamine (Uro-MP )  Methylene blue  (Uro-MP )  Pantoprazole  (Protonix )  Phenyl salicylate (Uro-MP )  Rizatriptan (Maxalt)  Sodium phosphate , monobasic (Uro-MP )  Timolol (Timoptic)  Valacyclovir  (Valtrex )  Vitamin D3 ==================================================================== For clinical consultation, please call [phone]. ====================================================================     No results found for: CBDTHCR No results found for: D8THCCBX No results found for: D9THCCBX  ROS  Constitutional: Denies any fever or chills Gastrointestinal: No reported hemesis, hematochezia, vomiting, or acute GI distress Musculoskeletal:  Neck Pain (Radiates to left side of head, and back left of head ) Neurological: No reported episodes of acute onset apraxia, aphasia, dysarthria, agnosia, amnesia, paralysis, loss of coordination, or loss of consciousness  Medication Review  NONFORMULARY OR COMPOUNDED ITEM, Naphazoline-Pheniramine, Uro-MP , acetaminophen ,  amLODipine , bimatoprost, buPROPion, cholecalciferol , conjugated estrogens , cyclobenzaprine , escitalopram , estradiol , fluconazole , gabapentin , hydrOXYzine , ibuprofen , latanoprost, liothyronine , meloxicam , methocarbamol , naloxone, ondansetron , oxyCODONE , pantoprazole , phenazopyridine , rizatriptan, timolol, and valACYclovir   History Review  Allergy: Kimberly [name] is allergic to shellfish allergy, peanut-containing drug products, codeine, diphtheria toxoid, and tetanus toxoids. Drug: Kimberly [name]  reports no history of drug use. Alcohol:  reports current alcohol use. Tobacco:  reports that she has never smoked. She has never used smokeless tobacco. Social: Kimberly [name]  reports that she has never smoked. She has never used smokeless tobacco. She reports current alcohol use. She reports that she does not use drugs. Medical:  has a past medical history of Anxiety,  Arthritis, Asthma, Balance problem, Brain cyst, Carpal tunnel syndrome, bilateral, Cervical fusion syndrome, Complete rupture of rotator cuff, Complication of anesthesia, Degenerative disc disease, cervical, Depression, Difficult intubation, Flu (08/10/2018), GERD (gastroesophageal reflux disease), Headache, Herpes, History of kidney stones, History of pneumonia, Hyperlipidemia, Hypertension, Hypothyroidism, Inflammation of shoulder joint, Memory difficulties, Neuromuscular disorder (HCC), Occipital neuralgia, Pneumothorax on right, Pseudoarthrosis of cervical spine (HCC), Recurrent falls, Syncope, and Urinary frequency. Surgical: Kimberly [name]  has a past surgical history that includes Abdominal hysterectomy; Rotator cuff repair (Bilateral, 2014); Foreign Body Removal (Left); Carpal tunnel release (Left, 02/16/2013); Carpal tunnel release (Right, 03/27/2013); Cesarean section; Anterior cervical decomp/discectomy fusion (N/A, 08/06/2014); Colonoscopy; Nasal sinus surgery (N/A, 04/13/2015); Shoulder arthroscopy with distal clavicle resection (Left, 05/18/2015); Shoulder acromioplasty (Left, 05/18/2015); Cholecystectomy (N/A, 06/17/2015); Neck surgery (2016); Anterior cervical decomp/discectomy fusion (N/A, 07/04/2016); Posterior cervical fusion/foraminotomy (N/A, 11/13/2016); Knee arthroscopy; Extracorporeal shock wave lithotripsy (Left, 09/04/2018); Colonoscopy with propofol  (N/A, 05/06/2019); Reduction mammaplasty (10/2019); Esophagogastroduodenoscopy (N/A, 05/08/2021); cysto with hydrodistension (N/A, 09/26/2021); Cystoscopy w/ retrogrades (Bilateral, 09/26/2021); Cystoscopy with biopsy (N/A, 09/26/2021); Ureteroscopy (Bilateral, 09/26/2021); Esophagogastroduodenoscopy (11/25/2017); Xi robotic assisted oophorectomy (N/A, 12/22/2021); and Excision vaginal cyst (N/A, 12/22/2021). Family: family history includes Cancer in her father; Hypertension in her father and another family member.  Laboratory Chemistry  Profile   Renal Lab Results  Component Value Date   BUN 11 02/09/2024   CREATININE 0.77 02/09/2024   BCR 10 07/01/2019   GFRAA 86 07/01/2019   GFRNONAA >60 02/09/2024    Hepatic Lab Results  Component Value Date   AST 22 02/09/2024   ALT 13 02/09/2024   ALBUMIN  4.0 02/09/2024   ALKPHOS 87 02/09/2024   LIPASE 25 02/09/2024    Electrolytes Lab Results  Component Value Date   NA 136 02/09/2024   K 3.8 02/09/2024   CL 102 02/09/2024   CALCIUM 9.3 02/09/2024    Bone No results found for: VD25OH, VD125OH2TOT, CI6874NY7, CI7874NY7, 25OHVITD1, 25OHVITD2, 25OHVITD3, TESTOFREE, TESTOSTERONE   Inflammation (CRP: Acute Phase) (ESR: Chronic Phase) Lab Results  Component Value Date   ESRSEDRATE 35 (H) 08/12/2021         Note: Above Lab results reviewed.  Recent Imaging Review  MR BRAIN WO CONTRAST CLINICAL DATA:  Provided history: Imbalance. Memory loss. Syncope, unspecified syncope type. Numbness and tingling. Dizziness.  EXAM: MRI HEAD WITHOUT CONTRAST  TECHNIQUE: Multiplanar, multiecho pulse sequences of the brain and surrounding structures were obtained without intravenous contrast.  COMPARISON:  Head CT 10/25/2023.  Brain MRI 03/20/2019.  FINDINGS: Brain:  No age-advanced or lobar predominant cerebral atrophy.  Moderate for age multifocal T2 FLAIR hyperintense signal abnormality within the cerebral white matter, nonspecific.  Prominent perivascular space again demonstrated within the left basal ganglia inferiorly.  No cortical encephalomalacia is identified.  There is no acute infarct.  No evidence of an intracranial mass.  No chronic intracranial blood products.  No extra-axial fluid collection.  No midline shift.  Vascular: Maintained flow voids within the proximal large arterial vessels.  Skull and upper cervical spine: No focal worrisome marrow lesion. Susceptibility artifact arising from cervical spinal  fusion hardware.  Sinuses/Orbits: No mass or acute finding within the imaged orbits. No significant paranasal sinus disease.  IMPRESSION: 1. No evidence of an acute intracranial abnormality. 2. Moderate for age multifocal T2 FLAIR hyperintense signal abnormality within the cerebral white matter, similar to the prior brain MRI of 03/20/2019. Findings are nonspecific, but most often secondary to chronic small vessel ischemia. Although not excluded, the appearance does not strongly suggest demyelinating disease.  Electronically Signed   By: Rockey Childs D.O.   On: 01/02/2024 08:29 Note: Reviewed        Physical Exam  Vitals: BP (!) 117/91 (BP Location: Right Arm, Patient Position: Sitting)   Pulse 97   Temp 98 F (36.7 C) (Oral)   Ht 5' 1 (1.549 m)   Wt 154 lb (69.9 kg)   SpO2 97%   BMI 29.10 kg/m  BMI: Estimated body mass index is 29.1 kg/m as calculated from the following:   Height as of this encounter: 5' 1 (1.549 m).   Weight as of this encounter: 154 lb (69.9 kg). Ideal: Ideal body weight: 47.8 kg (105 lb 6.1 oz) Adjusted ideal body weight: 56.6 kg (124 lb 13.2 oz) General appearance: Well nourished, well developed, and well hydrated. In no apparent acute distress Mental status: Alert, oriented x 3 (person, place, & time)       Respiratory: No evidence of acute respiratory distress Eyes: PERLA   Assessment   Diagnosis Status  1. Chronic pain syndrome   2. Cervical spondylosis without myelopathy   3. Bilateral occipital neuralgia   4. Cervical fusion syndrome (C2-T1)   5. Cervical myofascial pain syndrome   6. Hx of cervical spine surgery   7. Medication management    Controlled Controlled Controlled   Updated Problems: No problems updated.  Plan of Care  Problem-specific:  Assessment and Plan  Will continue on the current medication regimen, as it provides symptoms relief and supports the patient's functional activities.  Prescribing drug monitoring  (PDMP) is reviewed and consistent with prescribed medication. Routine UDS ordered today; however patient is unable to void enough for testing. She will come tomorrow and provider Urine sample. Schedule follow up in 90 days for medication management.   Kimberly [name] has a current medication list which includes the following long-term medication(s): bupropion and gabapentin .  Pharmacotherapy (Medications Ordered): Meds ordered this encounter  Medications   oxyCODONE  (OXY IR/ROXICODONE ) 5 MG immediate release tablet    Sig: Take 1 tablet (5 mg total) by mouth every 6 (six) hours as needed for severe pain (pain score 7-10). Must last 30 days.    Dispense:  120 tablet    Refill:  0    Chronic Pain: STOP Act (Not applicable) Fill 1 day early if closed on refill date. Avoid benzodiazepines within 8 hours of opioids   oxyCODONE  (OXY IR/ROXICODONE ) 5 MG immediate release tablet    Sig: Take 1 tablet (5 mg total) by mouth every 6 (six) hours as needed for severe pain (pain score 7-10). Must last 30 days.    Dispense:  120 tablet    Refill:  0    Chronic Pain: STOP Act (Not applicable) Fill 1 day early if closed on refill date. Avoid benzodiazepines within  8 hours of opioids   oxyCODONE  (OXY IR/ROXICODONE ) 5 MG immediate release tablet    Sig: Take 1 tablet (5 mg total) by mouth every 6 (six) hours as needed for severe pain (pain score 7-10). Must last 30 days.    Dispense:  120 tablet    Refill:  0    Chronic Pain: STOP Act (Not applicable) Fill 1 day early if closed on refill date. Avoid benzodiazepines within 8 hours of opioids   Orders:  Orders Placed This Encounter  Procedures   ToxASSURE Select 13 (MW), Urine    Volume: 30 ml(s). Minimum 3 ml of urine is needed. Document temperature of fresh sample. Indications: Long term (current) use of opiate analgesic (S20.108)    Release to patient:   Immediate        Return in about 3 months (around 05/19/2024) for (F2F), (MM), Emmy Blanch  NP.    Recent Visits Date Type Provider Dept  11/21/23 Office Visit Marcelino Nurse, MD [facility]  Showing recent visits within past 90 days and meeting all other requirements Today's Visits Date Type Provider Dept  02/17/24 Office Visit Calissa Swenor K, NP [facility]  Showing today's visits and meeting all other requirements Future Appointments Date Type Provider Dept  05/11/24 Appointment Isidoro Santillana K, NP [facility]  Showing future appointments within next 90 days and meeting all other requirements  I discussed the assessment and treatment plan with the patient. The patient was provided an opportunity to ask questions and all were answered. The patient agreed with the plan and demonstrated an understanding of the instructions.  Patient advised to call back or seek an in-person evaluation if the symptoms or condition worsens.  Duration of encounter: 30 minutes.  Total time on encounter, as per AMA guidelines included both the face-to-face and non-face-to-face time personally spent by the physician and/or other qualified health care professional(s) on the day of the encounter (includes time in activities that require the physician or other qualified health care professional and does not include time in activities normally performed by clinical staff). Physician's time may include the following activities when performed: Preparing to see the patient (e.g., pre-charting review of records, searching for previously ordered imaging, lab work, and nerve conduction tests) Review of prior analgesic pharmacotherapies. Reviewing PMP Interpreting ordered tests (e.g., lab work, imaging, nerve conduction tests) Performing post-procedure evaluations, including interpretation of diagnostic procedures Obtaining and/or reviewing separately obtained history Performing a medically appropriate examination and/or evaluation Counseling and educating the  patient/family/caregiver Ordering medications, tests, or procedures Referring and communicating with other health care professionals (when not separately reported) Documenting clinical information in the electronic or other health record Independently interpreting results (not separately reported) and communicating results to the patient/ family/caregiver Care coordination (not separately reported)  Note by: Marcelus Dubberly K Dalena Plantz, NP (TTS and AI technology used. I apologize for any typographical errors that were not detected and corrected.) Date: 02/17/2024; Time: 9:54 AM

## 2024-02-17 NOTE — Progress Notes (Signed)
 Nursing Pain Medication Assessment:  Safety precautions to be maintained throughout the outpatient stay will include: orient to surroundings, keep bed in low position, maintain call bell within reach at all times, provide assistance with transfer out of bed and ambulation.  Medication Inspection Compliance: Pill count conducted under aseptic conditions, in front of the patient. Neither the pills nor the bottle was removed from the patient's sight at any time. Once count was completed pills were immediately returned to the patient in their original bottle.  Medication: Oxycodone  IR Pill/Patch Count: 94 of 120 pills/patches remain Pill/Patch Appearance: Markings consistent with prescribed medication Bottle Appearance: Standard pharmacy container. Clearly labeled. Filled Date: 07 / 08 / 2025 Last Medication intake:  Yesterday

## 2024-02-22 LAB — TOXASSURE SELECT 13 (MW), URINE

## 2024-04-03 IMAGING — CT CT ABD-PELV W/ CM
2 of 5 series · 15 of 46 positions shown, 17 images · IV contrast (agent unspecified)
Comparison: Comparison is made with multiple prior studies most
remote in 2899 and most recent in Friday July, 2021.

CLINICAL DATA: History of pelvic adenopathy by report, follow-up
evaluation.

EXAM:
CT ABDOMEN AND PELVIS WITH CONTRAST
TECHNIQUE: Multidetector CT imaging of the abdomen and pelvis was performed
using the standard protocol following bolus administration of
intravenous contrast.

[Series 2: abd pelvis 5.00 · axial · 0.60mm/px · z∈[-1476,-1086]mm · 12 of 88 slices shown, 14 images]
[im 5/88  soft-tissue]
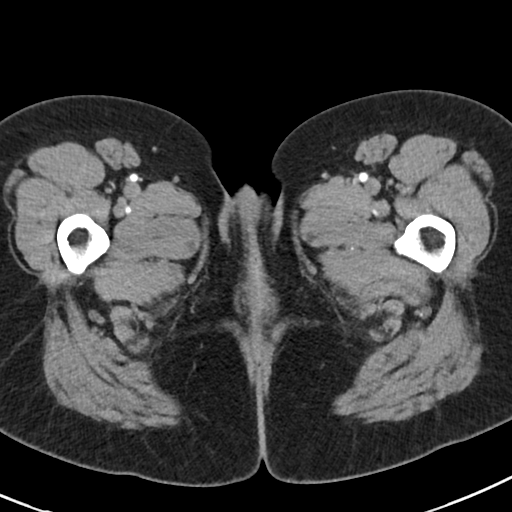
[im 5/88  bone]
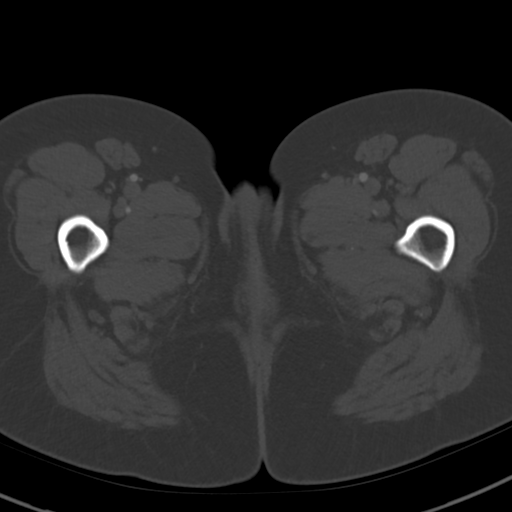
[im 14/88  soft-tissue]
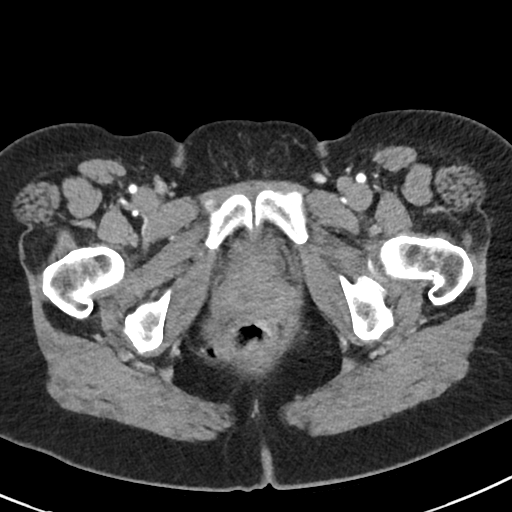
[im 19/88  soft-tissue]
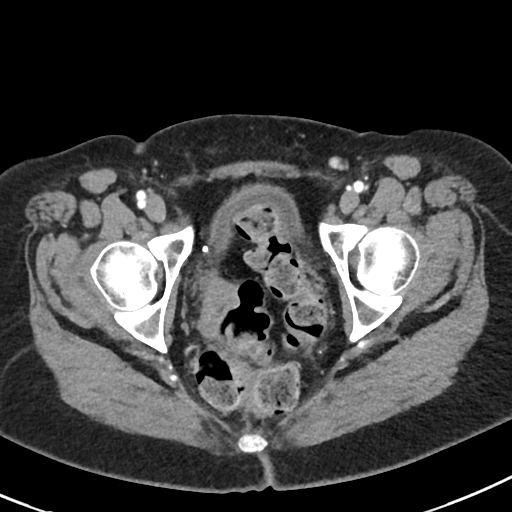
[im 28/88  soft-tissue]
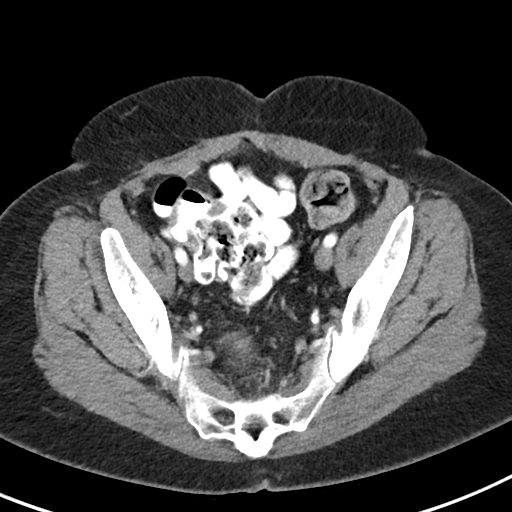
[im 33/88  soft-tissue]
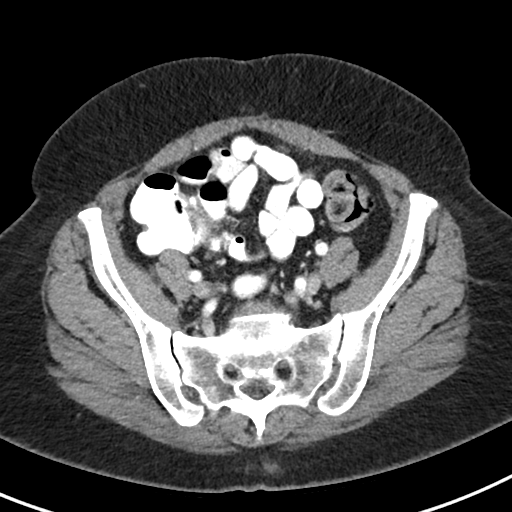
[im 42/88  soft-tissue]
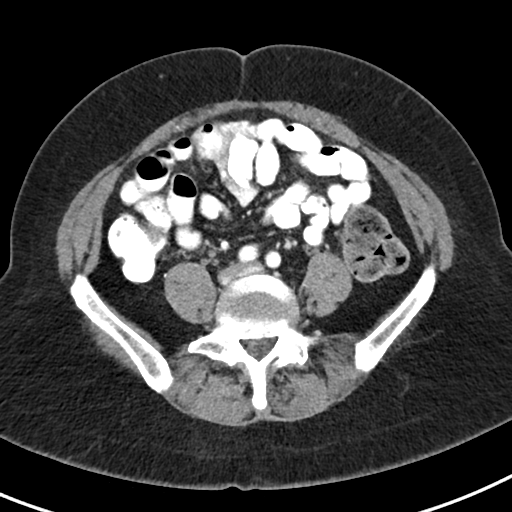
[im 46/88  soft-tissue]
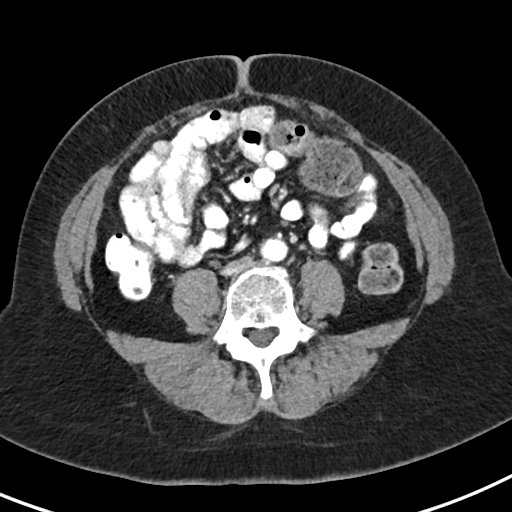
[im 55/88  soft-tissue]
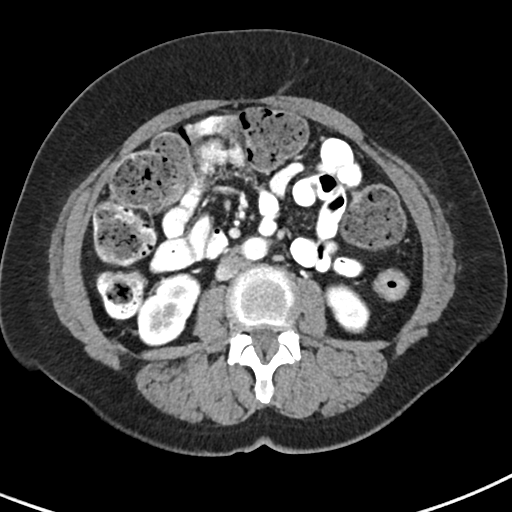
[im 60/88  soft-tissue]
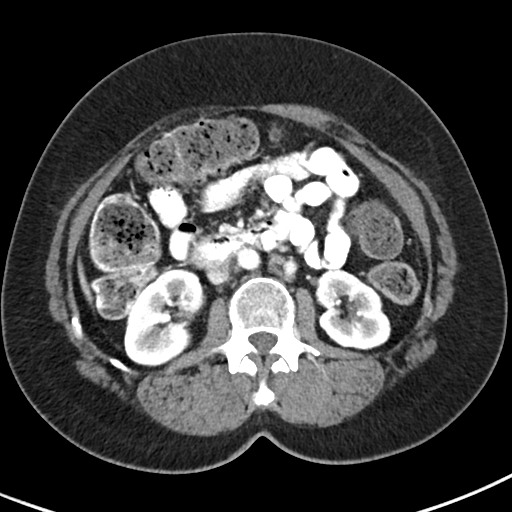
[im 60/88  bone]
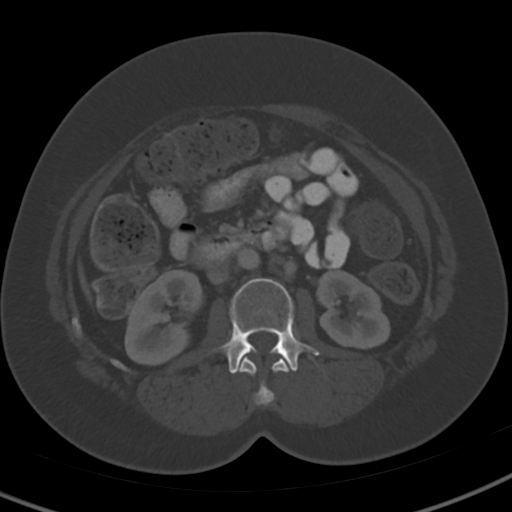
[im 69/88  soft-tissue]
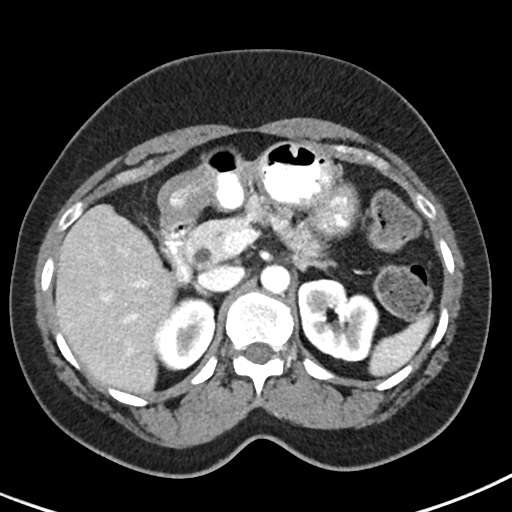
[im 74/88  soft-tissue]
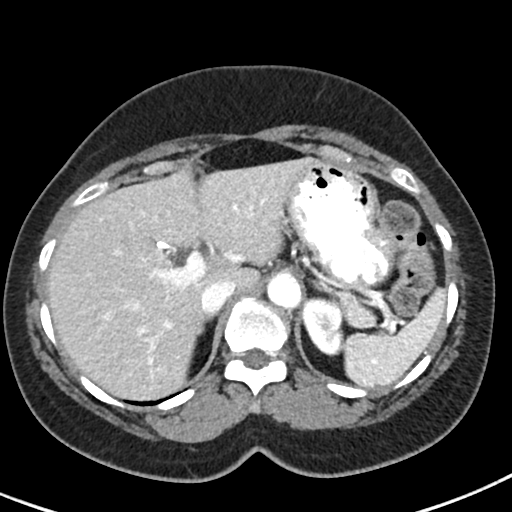
[im 83/88  soft-tissue]
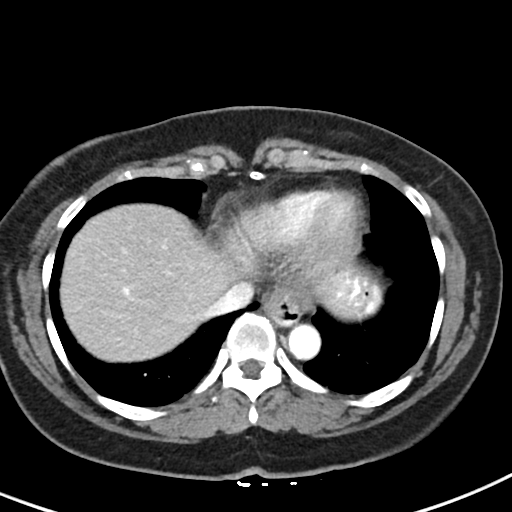

[Series 4: coronals abd pelvis 2.00 cor · coronal · 0.60mm/px · 3 of 139 slices shown]
[im 47/139  soft-tissue]
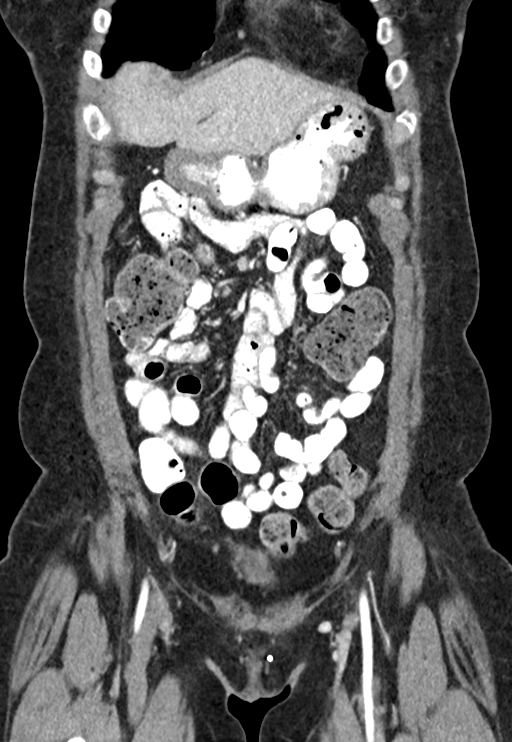
[im 62/139  soft-tissue]
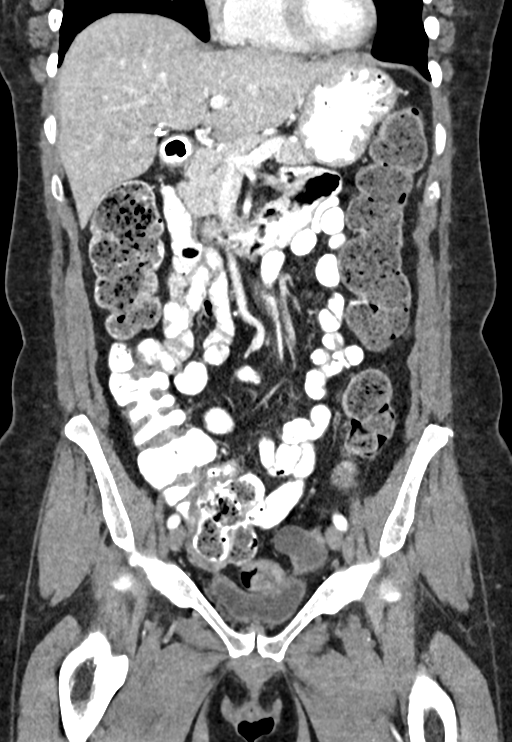
[im 77/139  soft-tissue]
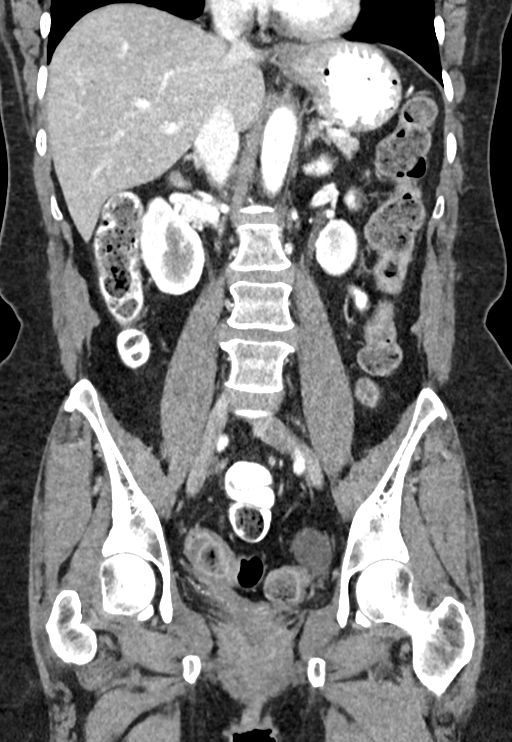

[15 of 46 positions shown; findings below may reference images not displayed]

RADIATION DOSE REDUCTION: This exam was performed according to the
departmental dose-optimization program which includes automated
exposure control, adjustment of the mA and/or kV according to
patient size and/or use of iterative reconstruction technique.

CONTRAST:  85mL OMNIPAQUE IOHEXOL 300 MG/ML  SOLN
FINDINGS: Lower chest: Lung bases are clear. No effusion. No consolidative
changes.

Hepatobiliary: No focal, suspicious hepatic lesion. Portal vein is
patent. Post cholecystectomy with stable prominence of biliary tree.

Pancreas: Normal, without mass, inflammation or ductal dilatation.

Spleen: Normal.

Adrenals/Urinary Tract: Adrenal glands are normal.

Symmetric renal enhancement. No hydronephrosis. No perivesical
stranding.

Stomach/Bowel: Small hiatal hernia. No acute gastrointestinal
process. The appendix is normal.

Vascular/Lymphatic:

Aorta with smooth contours. IVC with smooth contours. No aneurysmal
dilation of the abdominal aorta. There is no gastrohepatic or
hepatoduodenal ligament lymphadenopathy. No retroperitoneal or
mesenteric lymphadenopathy.

No pelvic sidewall lymphadenopathy.

Reproductive: Post hysterectomy. LEFT adnexal cyst with thin
septation 4.8 x 3.1 cm previously 5.0 x 3.1 cm, stable over time and
previously evaluated with ultrasound.

Other: No ascites.  No pneumoperitoneum.

Musculoskeletal: No acute bone finding. No destructive bone process.
Spinal degenerative changes.
IMPRESSION: 1. No acute abdominal or pelvic pathology.
2. LEFT adnexal cyst with thin septation, stable over time and
previously evaluated with ultrasound. Compatible with benign process
and not substantially changed dating back to 6292.
3. Small hiatal hernia.

## 2024-05-06 ENCOUNTER — Telehealth: Payer: Self-pay | Admitting: Student in an Organized Health Care Education/Training Program

## 2024-05-06 NOTE — Progress Notes (Signed)
 Referring Physician:  Alla Amis, MD [phone] [facility] [address] [facility] [place],  [place] [address]  Primary Physician:  Alla Amis, MD  History of Present Illness: 05/12/2024 Ms. Marval Lay who previously has undergone an ACDF at C3-4 and C6-7 and posterior fixation from C3 through T1.  She comes in today for worsening pain at the back of her neck towards the top of her previous incision that radiates up to the back of her head.  She states that it is severe in nature, she previously had injections completed for this they only helped for about a day and then they go away.  There is no inciting event in which she has had increased pain over the past several months, but she acknowledges that she has had a couple of falls and she has been feeling more unsteady on her feet.  She feels that this has become worse over the past couple of months.  She denies any numbness or tingling that goes down her arms.  Denies any new saddle anesthesia or incontinence.  Weakness: none  Bowel/Bladder Dysfunction: none  Conservative measures:  Physical therapy: in PT currently  Multimodal medical therapy including regular antiinflammatories:  acetaminophen , gabapentin ,meloxicam , methocarbamol , oxycodone   Injections:   Past Surgery: As above  Marval ONEIDA Lay has symptoms of cervical myelopathy.  The symptoms are causing a significant impact on the patient's life.   Review of Systems:  A 10 point review of systems is negative, except for the pertinent positives and negatives detailed in the HPI.  Past Medical History: Past Medical History:  Diagnosis Date   Anxiety    takes alprazolam  - rare use    Arthritis    cervical spondylosis    Asthma    Balance problem    Brain cyst    Carpal tunnel syndrome, bilateral    Cervical fusion syndrome    Complete rupture of rotator cuff    Complication of anesthesia    lung collapsed after neck fusion   Degenerative disc  disease, cervical    Depression    Difficult intubation    Small mouth opening, limited neck flexion, very anterior    Flu 08/10/2018   tested positive   GERD (gastroesophageal reflux disease)    pt. reports that its better, no meds in use at this time- 2015   Headache    Herpes    History of kidney stones    History of pneumonia    Hyperlipidemia    Hypertension    pt. doesn't see cardiologist, followed for HTN by Dr. Bertell   Hypothyroidism    Inflammation of shoulder joint    Memory difficulties    Neuromuscular disorder (HCC)    joint and muscle problems   Occipital neuralgia    Pneumothorax on right    following C4-6 ACDF 07/04/16   Pseudoarthrosis of cervical spine (HCC)    Recurrent falls    Syncope    Urinary frequency     Past Surgical History: Past Surgical History:  Procedure Laterality Date   ABDOMINAL HYSTERECTOMY     ANTERIOR CERVICAL DECOMP/DISCECTOMY FUSION N/A 08/06/2014   Procedure: ANTERIOR CERVICAL DECOMPRESSION/DISCECTOMY FUSION CERVICAL THREE-FOUR,CERVICAL SIX-SEVEN ,CERVICAL SEVEN-THORACIC ONE;  Surgeon: Catalina CHRISTELLA Stains, MD;  Location: [facility] OR;  Service: Neurosurgery;  Laterality: N/A;   ANTERIOR CERVICAL DECOMP/DISCECTOMY FUSION N/A 07/04/2016   Procedure: CERVICAL FOUR-FIVE, CERVICAL FIVE-SIX ANTERIOR CERVICAL DECOMPRESSION/DISCECTOMY/FUSION WITH REVISION OF CERVICAL THREE-FOUR PLATE;  Surgeon: Catalina Stains, MD;  Location: [facility] OR;  Service:  Neurosurgery;  Laterality: N/A;   CARPAL TUNNEL RELEASE Left 02/16/2013   Procedure: CARPAL TUNNEL RELEASE;  Surgeon: Taft FORBES Minerva, MD;  Location: [facility];  Service: Orthopedics;  Laterality: Left;   CARPAL TUNNEL RELEASE Right 03/27/2013   Procedure: RIGHT CARPAL TUNNEL RELEASE;  Surgeon: Taft FORBES Minerva, MD;  Location: [facility];  Service: Orthopedics;  Laterality: Right;   CESAREAN SECTION     x2   CHOLECYSTECTOMY N/A 06/17/2015   Procedure: LAPAROSCOPIC CHOLECYSTECTOMY;  Surgeon: Oneil Budge, MD;   Location: [facility];  Service: General;  Laterality: N/A;   COLONOSCOPY     COLONOSCOPY WITH PROPOFOL  N/A 05/06/2019   Procedure: COLONOSCOPY WITH PROPOFOL ;  Surgeon: Dessa Reyes ORN, MD;  Location: [facility];  Service: Endoscopy;  Laterality: N/A;   CYSTO WITH HYDRODISTENSION N/A 09/26/2021   Procedure: CYSTOSCOPY/HYDRODISTENSION;  Surgeon: Twylla Glendia BROCKS, MD;  Location: [facility];  Service: Urology;  Laterality: N/A;   CYSTOSCOPY W/ RETROGRADES Bilateral 09/26/2021   Procedure: CYSTOSCOPY WITH RETROGRADE PYELOGRAM;  Surgeon: Twylla Glendia BROCKS, MD;  Location: [facility];  Service: Urology;  Laterality: Bilateral;   CYSTOSCOPY WITH BIOPSY N/A 09/26/2021   Procedure: CYSTOSCOPY WITH BIOPSY;  Surgeon: Twylla Glendia BROCKS, MD;  Location: [facility];  Service: Urology;  Laterality: N/A;   ESOPHAGOGASTRODUODENOSCOPY N/A 05/08/2021   Procedure: ESOPHAGOGASTRODUODENOSCOPY (EGD);  Surgeon: Maryruth Ole DASEN, MD;  Location: [facility] END[facility]COPY;  Service: Endoscopy;  Laterality: N/A;   ESOPHAGOGASTRODUODENOSCOPY  11/25/2017   EXCISION VAGINAL CYST N/A 12/22/2021   Procedure: EXCISION VULVAR CYST;  Surgeon: Verdon Keen, MD;  Location: [facility];  Service: Gynecology;  Laterality: N/A;   EXTRACORPOREAL SHOCK WAVE LITHOTRIPSY Left 09/04/2018   Procedure: EXTRACORPOREAL SHOCK WAVE LITHOTRIPSY (ESWL);  Surgeon: Francisca Redell BROCKS, MD;  Location: [facility];  Service: Urology;  Laterality: Left;   FOREIGN BODY REMOVAL Left    knee-as child   KNEE ARTHROSCOPY     NASAL SINUS SURGERY N/A 04/13/2015   Procedure: nasal endoscopy with adenoid biopsy;  Surgeon: Merilee Kraft, MD;  Location: [facility] OR;  Service: ENT;  Laterality: N/A;   NECK SURGERY  2016   x 3 all together   POSTERIOR CERVICAL FUSION/FORAMINOTOMY N/A 11/13/2016   Procedure: CERVICAL TWO-CERVICAL SIX POSTERIOR CERVICAL FUSION WITH LATERAL MASS FIXATION;  Surgeon: Victory Gens, MD;  Location: [facility];  Service: Neurosurgery;  Laterality: N/A;  posterior  approach   REDUCTION MAMMAPLASTY  10/2019   ROTATOR CUFF REPAIR Bilateral 2014   SHOULDER ACROMIOPLASTY Left 05/18/2015   Procedure: SHOULDER ACROMIOPLASTY;  Surgeon: Toribio Silos, MD;  Location: [facility];  Service: Orthopedics;  Laterality: Left;   SHOULDER ARTHROSCOPY WITH DISTAL CLAVICLE RESECTION Left 05/18/2015   Procedure: LEFT SHOULDER ARTHROSCOPY WITH  DISTAL CLAVICLE RESECTION;  Surgeon: Toribio Silos, MD;  Location: [facility];  Service: Orthopedics;  Laterality: Left;   URETEROSCOPY Bilateral 09/26/2021   Procedure: URETEROSCOPY;  Surgeon: Twylla Glendia BROCKS, MD;  Location: [facility];  Service: Urology;  Laterality: Bilateral;   XI ROBOTIC ASSISTED OOPHORECTOMY N/A 12/22/2021   Procedure: XI ROBOTIC ASSISTED DIAGNOSTIC LAPAROSCOPY, LEFT OOPHORECTOMY, WITH RIGHT SALPINGECTOMY AND LYSIS OF ADHESIONS;  Surgeon: Verdon Keen, MD;  Location: [facility];  Service: Gynecology;  Laterality: N/A;    Allergies: Allergies as of 05/12/2024 - Review Complete 05/11/2024  Allergen Reaction Noted   Shellfish allergy Anaphylaxis and Hives 03/22/2011   Peanut-containing drug products Hives 03/22/2011   Codeine Other (See Comments)    Diphtheria toxoid Rash 08/06/2018   Tetanus toxoid-containing vaccines  Rash 08/07/2018    Medications: Outpatient Encounter Medications as of 05/12/2024  Medication Sig   acetaminophen  (TYLENOL ) 500 MG tablet Take 500 mg by mouth 2 (two) times daily as needed for headache.   amLODipine  (NORVASC ) 5 MG tablet Take 5 mg by mouth daily.   buPROPion (WELLBUTRIN XL) 150 MG 24 hr tablet Take 150 mg by mouth daily.   cholecalciferol  (VITAMIN D3) 25 MCG (1000 UNIT) tablet Take 1,000 Units by mouth daily.   conjugated estrogens  (PREMARIN ) vaginal cream APPLY A PEA SIZED AMOUNT JUST INSIDE THE VAGINAL INTROITUS WITH A FINGER TIP ON MONDAY , WEDNESDAY AND FRIDAY NIGHTS   cyclobenzaprine  (FLEXERIL ) 10 MG tablet Take 10 mg by mouth 3 (three)  times daily as needed for muscle spasms.   escitalopram  (LEXAPRO ) 5 MG tablet Take 5 mg by mouth daily.   estradiol  (ESTRACE ) 1 MG tablet Take 1.5 mg by mouth daily.   fluconazole  (DIFLUCAN ) 100 MG tablet Take 100 mg by mouth daily.   gabapentin  (NEURONTIN ) 600 MG tablet Take 1 tablet (600 mg total) by mouth 3 (three) times daily.   hydrOXYzine  (ATARAX ) 10 MG tablet Take 1 tablet (10 mg total) by mouth 3 (three) times daily as needed.   ibuprofen  (ADVIL ) 600 MG tablet Take 600 mg by mouth 3 (three) times daily.   latanoprost (XALATAN) 0.005 % ophthalmic solution Place 1 drop into both eyes at bedtime.   liothyronine  (CYTOMEL ) 5 MCG tablet Take 5 mcg by mouth daily.   LUMIGAN 0.01 % SOLN Place 1 drop into both eyes at bedtime.   meloxicam  (MOBIC ) 15 MG tablet Take 15 mg by mouth daily as needed for pain.   Meth-Hyo-M Bl-Na Phos-Ph Sal (URO-MP ) 118 MG CAPS Take one every 6 hours prn for dysuria   methocarbamol  (ROBAXIN ) 500 MG tablet Take 500 mg by mouth every 8 (eight) hours as needed for muscle spasms.   naloxone (NARCAN) nasal spray 4 mg/0.1 mL Place 1 spray into the nose once.   NONFORMULARY OR COMPOUNDED ITEM Place 10 mg vaginally at bedtime as needed (spasms). Diazepam  10 mg   ondansetron  (ZOFRAN -ODT) 4 MG disintegrating tablet Take 1 tablet (4 mg total) by mouth every 8 (eight) hours as needed for nausea or vomiting.   [START ON 06/05/2024] oxyCODONE  (OXY IR/ROXICODONE ) 5 MG immediate release tablet Take 1 tablet (5 mg total) by mouth every 6 (six) hours as needed for severe pain (pain score 7-10). Must last 30 days.   [START ON 07/05/2024] oxyCODONE  (OXY IR/ROXICODONE ) 5 MG immediate release tablet Take 1 tablet (5 mg total) by mouth every 6 (six) hours as needed for severe pain (pain score 7-10). Must last 30 days.   [START ON 08/04/2024] oxyCODONE  (OXY IR/ROXICODONE ) 5 MG immediate release tablet Take 1 tablet (5 mg total) by mouth every 6 (six) hours as needed for severe pain (pain score  7-10). Must last 30 days.   pantoprazole  (PROTONIX ) 40 MG tablet Take 40 mg by mouth every morning.   phenazopyridine  (PYRIDIUM ) 200 MG tablet Take 1 tablet (200 mg total) by mouth 3 (three) times daily as needed for pain.   rizatriptan (MAXALT) 10 MG tablet Take 10 mg by mouth as needed for migraine. May repeat in 2 hours if needed   Tetrahydrozoline HCl (VISINE OP) Apply 1 drop to eye daily as needed (irritation).   timolol (TIMOPTIC) 0.25 % ophthalmic solution Place 1 drop into the right eye daily.   valACYclovir  (VALTREX ) 1000 MG tablet Take 1,000 mg by mouth daily.   [  DISCONTINUED] gabapentin  (NEURONTIN ) 600 MG tablet Take 1 tablet (600 mg total) by mouth 3 (three) times daily.   [DISCONTINUED] oxyCODONE  (OXY IR/ROXICODONE ) 5 MG immediate release tablet Take 1 tablet (5 mg total) by mouth every 6 (six) hours as needed for severe pain (pain score 7-10). Must last 30 days.   No facility-administered encounter medications on file as of 05/12/2024.    Social History: Social History   Tobacco Use   Smoking status: Never   Smokeless tobacco: Never  Vaping Use   Vaping status: Never Used  Substance Use Topics   Alcohol use: Yes    Comment: occ   Drug use: No    Family Medical History: Family History  Problem Relation Age of Onset   Cancer Father        prostate cancer   Hypertension Father    Hypertension Other    Breast cancer Neg Hx     Physical Examination: @VITALWITHPAIN @  General: Patient is well developed, well nourished, calm, collected, and in no apparent distress. Attention to examination is appropriate.  Psychiatric: Patient is non-anxious.  Head:  Pupils equal, round, and reactive to light.  ENT:  Oral mucosa appears well hydrated.  Neck:   Supple.  Limited ROM   Respiratory: Patient is breathing without any difficulty.  Extremities: No edema.  Vascular: Palpable dorsal pedal pulses.  Skin:   On exposed skin, there are no abnormal skin  lesions.  NEUROLOGICAL:     Awake, alert, oriented to person, place, and time.  Speech is clear and fluent. Fund of knowledge is appropriate.   Cranial Nerves: Pupils equal round and reactive to light.  Facial tone is symmetric.   ROM of spine: Patient has limited range of motion of her cervical spine.  She does have tenderness palpation of the proximal aspect of her incision.     Strength: Side Biceps Triceps Deltoid Interossei Grip Wrist Ext. Wrist Flex.  R 5 5 5 5 5 5 5   L 5 5 5 5 5 5 5     Reflexes are 2+ and symmetric at the biceps, triceps. Hoffman's is present on the left. Bilateral upper and lower extremity sensation is intact to light touch.    Patient does have some slight difficulty with tandem gait.  Medical Decision Making  Imaging: EXAM: MRI CERVICAL SPINE WITHOUT AND WITH CONTRAST   TECHNIQUE: Multiplanar and multiecho pulse sequences of the cervical spine, to include the craniocervical junction and cervicothoracic junction, were obtained without and with intravenous contrast.   CONTRAST:  6mL GADAVIST  GADOBUTROL  1 MMOL/ML IV SOLN   COMPARISON:  Radiography 02/27/2021.  MRI 06/15/2020   FINDINGS: Alignment: Straightening of the normal cervical lordosis   Vertebrae: Anterior and posterior fusion from C2 through C7. No sign of nonunion.   Cord: No cord compression or focal cord lesion. No evidence of myelopathy.   Posterior Fossa, vertebral arteries, paraspinal tissues: No evidence of significant degenerative disease at the skull base C1 articulation or the C1-C2 articulation.   Disc levels:   Foramen magnum widely patent. As noted above, there is a remarkable absence of visible degenerative change at the skull base C1 articulation and C1-C2 articulation.   Apparent sufficient patency of the canal from C2 through C7. Foraminal detail is limited by artifact from fusion hardware.   At C7-T1, there is bulging of the disc and bilateral  facet osteoarthritis. No compressive canal stenosis. Foraminal narrowing could possibly affect the C8 nerves.   T1-2 and T2-3 show bulging  of the disc but no compressive narrowing of the canal. There is some foraminal narrowing, more at T1-2 and T2-3.   IMPRESSION: 1. Anterior and posterior fusion from C2 through C7. No sign of nonunion. 2. Apparent sufficient patency of the canal from C2 through C7. Foraminal detail is limited by artifact from fusion hardware. 3. C7-T1 disc bulge and facet osteoarthritis. Foraminal narrowing could possibly affect the C8 nerves. 4. Lesser degenerative changes at T1-2 and T2-3 with foraminal narrowing that could possibly affect the exiting T1 and T2 nerves.      I have personally reviewed the images and agree with the above interpretation.  Assessment and Plan: Kimberly [name] is a pleasant 63 y.o. female with C3-4 and C6-7 ACDF  and posterior fixation from C2-C7.  She comes in today for increased neck pain at the proximal aspect of her incision that has become worse over the past several months and extends into the back of her head.  She has also had worsening gait.  Plan includes the following:  Due to extensive nature of patient's current fusion I want to ensure there is no hardware failure or worsening stenosis causing myelopathic symptoms.  Plan for x-rays today as well as an MRI of her cervical spine.  May need to also get a CT myelogram in the future due to hardware artifact.  Will review imaging once complete.  Patient is already in physical therapy, but this is primarily for her gait instability.  I have asked them to go ahead and incorporate more neck exercises for her.  Advised patient to continue seeing pain management.  I will reach out to her once her imaging is all complete.   Thank you for involving me in the care of this patient.   I spent a total of 34 minutes in both face-to-face and non-face-to-face activities for this visit on the date  of this encounter including preparing to see the patient, obtaining and reviewing separately obtained history, performing medically appropriate examination, counseling the patient, ordering additional  tests, documenting clinical information, independently interpreting results, coordination of care.   Lyle Decamp, PA-C Dept. of Neurosurgery

## 2024-05-06 NOTE — Telephone Encounter (Signed)
 Called patient and she does not need PA for medications at this time.

## 2024-05-06 NOTE — Telephone Encounter (Signed)
 Patient states she needs PA for her medication.

## 2024-05-11 ENCOUNTER — Ambulatory Visit: Attending: Nurse Practitioner | Admitting: Nurse Practitioner

## 2024-05-11 ENCOUNTER — Encounter: Payer: Self-pay | Admitting: Nurse Practitioner

## 2024-05-11 VITALS — BP 116/78 | HR 104 | Temp 97.1°F | Resp 18 | Ht 61.0 in | Wt 156.0 lb

## 2024-05-11 DIAGNOSIS — M47812 Spondylosis without myelopathy or radiculopathy, cervical region: Secondary | ICD-10-CM | POA: Diagnosis not present

## 2024-05-11 DIAGNOSIS — Z9889 Other specified postprocedural states: Secondary | ICD-10-CM | POA: Diagnosis present

## 2024-05-11 DIAGNOSIS — M5030 Other cervical disc degeneration, unspecified cervical region: Secondary | ICD-10-CM | POA: Diagnosis present

## 2024-05-11 DIAGNOSIS — Q761 Klippel-Feil syndrome: Secondary | ICD-10-CM | POA: Diagnosis present

## 2024-05-11 DIAGNOSIS — M5481 Occipital neuralgia: Secondary | ICD-10-CM | POA: Diagnosis present

## 2024-05-11 DIAGNOSIS — M7918 Myalgia, other site: Secondary | ICD-10-CM | POA: Diagnosis present

## 2024-05-11 DIAGNOSIS — Z79899 Other long term (current) drug therapy: Secondary | ICD-10-CM | POA: Diagnosis present

## 2024-05-11 DIAGNOSIS — G894 Chronic pain syndrome: Secondary | ICD-10-CM | POA: Diagnosis present

## 2024-05-11 MED ORDER — OXYCODONE HCL 5 MG PO TABS: 5.0000 mg | ORAL_TABLET | Freq: Four times a day (QID) | ORAL | 0 refills | Status: AC | PRN

## 2024-05-11 MED ORDER — OXYCODONE HCL 5 MG PO TABS
5.0000 mg | ORAL_TABLET | Freq: Four times a day (QID) | ORAL | 0 refills | Status: AC | PRN
Start: 2024-07-05 — End: 2024-08-04

## 2024-05-11 MED ORDER — GABAPENTIN 600 MG PO TABS: 600.0000 mg | ORAL_TABLET | Freq: Three times a day (TID) | ORAL | 5 refills | Status: AC

## 2024-05-11 NOTE — Progress Notes (Signed)
 Nursing Pain Medication Assessment:  Safety precautions to be maintained throughout the outpatient stay will include: orient to surroundings, keep bed in low position, maintain call bell within reach at all times, provide assistance with transfer out of bed and ambulation.  Medication Inspection Compliance: Pill count conducted under aseptic conditions, in front of the patient. Neither the pills nor the bottle was removed from the patient's sight at any time. Once count was completed pills were immediately returned to the patient in their original bottle.  Medication: Oxycodone  IR Pill/Patch Count: 98 of 120 pills/patches remain Pill/Patch Appearance: Markings consistent with prescribed medication Bottle Appearance: Standard pharmacy container. Clearly labeled. Filled Date: 10 / 08 / 2025 Last Medication intake:  Yesterday

## 2024-05-11 NOTE — Progress Notes (Signed)
 PROVIDER NOTE: Interpretation of information contained herein should be left to medically-trained personnel. Specific patient instructions are provided elsewhere under Patient Instructions section of medical record. This document was created in part using AI and STT-dictation technology, any transcriptional errors that may result from this process are unintentional.  Patient: Kimberly [name]  Service: E/M   PCP: Alla Amis, MD  DOB: [birth year]  DOS: 05/11/2024  Provider: Emmy MARLA Blanch, NP  MRN: [id number]  Delivery: Face-to-face  Specialty: Interventional Pain Management  Type: Established Patient  Setting: Ambulatory outpatient facility  Specialty designation: 09  Referring Prov.: Alla Amis, MD  Location: Outpatient office facility       History of present illness (HPI) Kimberly [name], a 63 y.o. year old female, is here today because of her  Chronic pain syndrome [G89.4] and Headache. Kimberly [name]'s primary complain today is Headache (Head Pain, radiating around to front and sides of head )  Pertinent problems: Kimberly [name]  has Shoulder pain; Impingement syndrome; Rupture of rotator cuff; CTS (Carpal tunnel syndrome); Cervical spondylosis without myelopathy; Cervical stenosis of spinal canal; Pseudoarthritis of cervical spine (HCC); Numbness and tingling; Vitamin D deficiency; Cervical fusion syndrome; Hx of cervical spine surgery; and Chronic pain syndrome on their pertinent problem list.   Pain Assessment: Severity of Chronic pain is reported as a 8 /10. Location: Head Right, Left/All around head. Onset: More than a month ago. Quality: Aching, Radiating, Constant. Timing: Constant. Modifying factor(s): Heat, medication,. Vitals:  height is 5' 1 (1.549 m) and weight is 156 lb (70.8 kg). Her temporal temperature is 97.1 F (36.2 C) (abnormal). Her blood pressure is 116/78 and her pulse is 104 (abnormal). Her respiration is 18 and oxygen saturation is 97%.  BMI: Estimated body  mass index is 29.48 kg/m as calculated from the following:   Height as of this encounter: 5' 1 (1.549 m).   Weight as of this encounter: 156 lb (70.8 kg).  Last encounter: 02/17/2024. Last procedure: Visit date not found.  Reason for encounter: medication management. No change in medical history since last visit.  Patient's pain is at baseline.  Patient continues multimodal pain regimen as prescribed.  States that it provides pain relief and improvement in functional status.  The patient continues to experience headache radiating around the front and side of the head.  She has an upcoming appointment with neurology, where she will discuss obtaining a CT scan for further evaluation of her headaches.  Discussed the use of AI scribe software for clinical note transcription with the patient, who gave verbal consent to proceed.  History of Present Illness   Kimberly [name] is a 63 year old female with chronic cervical pain who presents with worsening pain following cervical surgeries.  She has a history of cervical pain that began after a work-related strain, leading to multiple surgeries. Initially, she underwent two surgeries on the left side of her neck, which provided some relief. However, after the initial surgeries, her pain worsened and she subsequently underwent a third surgery on the back of her head in 2018, which she reports involved the C6, C7, and C8 vertebrae. Since then, she has experienced persistent and excruciating pain, which she rates as a 7 out of 10 on a typical day, escalating to 9 out of 10 on bad days.  She is currently taking oxycodone  every four hours as needed, despite being prescribed every six hours, and gabapentin  at a dose of 600 mg three times daily. She reports no  side effects from these medications.  Additionally, she experiences left hip pain that radiates to her lower back, exacerbated by activities such as sweeping and walking. She takes meloxicam  for this pain  and has an upcoming physical therapy appointment. She notes a family history of similar hip pain, as her mother and sisters have experienced it as well.     Pharmacotherapy Assessment   Oxycodone  (Oxy IR/oxycodone ) 5 mg immediate release every 6 hours as needed for severe pain. MME=30  Gabapentin  600 mg 3 times daily for neuropathy pain Monitoring: [place] PMP: PDMP reviewed during this encounter.       Pharmacotherapy: No side-effects or adverse reactions reported. Compliance: No problems identified. Effectiveness: Clinically acceptable.  Kimberly [name], Kimberly [name]  05/11/2024 10:09 AM  Sign when Signing Visit Nursing Pain Medication Assessment:  Safety precautions to be maintained throughout the outpatient stay will include: orient to surroundings, keep bed in low position, maintain call bell within reach at all times, provide assistance with transfer out of bed and ambulation.  Medication Inspection Compliance: Pill count conducted under aseptic conditions, in front of the patient. Neither the pills nor the bottle was removed from the patient's sight at any time. Once count was completed pills were immediately returned to the patient in their original bottle.  Medication: Oxycodone  IR Pill/Patch Count: 98 of 120 pills/patches remain Pill/Patch Appearance: Markings consistent with prescribed medication Bottle Appearance: Standard pharmacy container. Clearly labeled. Filled Date: 10 / 08 / 2025 Last Medication intake:  Yesterday    UDS:  Summary  Date Value Ref Range Status  02/19/2024 FINAL  Final    Comment:    ==================================================================== [employer] [facility] (MW) ==================================================================== Test                             Result       Flag       Units  Drug Present   Oxycodone                       685                     ng/mg creat   Oxymorphone                    68                      ng/mg creat    Noroxycodone                   3757                    ng/mg creat    Sources of oxycodone  include scheduled prescription medications.    Oxymorphone and noroxycodone are expected metabolites of oxycodone .    Oxymorphone is also available as a scheduled prescription medication.  ==================================================================== Test                      Result    Flag   Units      Ref Range   Creatinine              149              mg/dL      >=79 ==================================================================== Declared Medications:  Medication list was not provided. ==================================================================== For clinical consultation, please call [phone]. ====================================================================  No results found for: CBDTHCR No results found for: D8THCCBX No results found for: D9THCCBX  ROS  Constitutional: Denies any fever or chills Gastrointestinal: No reported hemesis, hematochezia, vomiting, or acute GI distress Musculoskeletal: Headache (Head Pain, radiating around to front and sides of head) Neurological: No reported episodes of acute onset apraxia, aphasia, dysarthria, agnosia, amnesia, paralysis, loss of coordination, or loss of consciousness  Medication Review  NONFORMULARY OR COMPOUNDED ITEM, Naphazoline-Pheniramine, Uro-MP , acetaminophen , amLODipine , bimatoprost, buPROPion, cholecalciferol , conjugated estrogens , cyclobenzaprine , escitalopram , estradiol , fluconazole , gabapentin , hydrOXYzine , ibuprofen , latanoprost, liothyronine , meloxicam , methocarbamol , naloxone, ondansetron , oxyCODONE , pantoprazole , phenazopyridine , rizatriptan, timolol, and valACYclovir   History Review  Allergy: Kimberly [name] is allergic to shellfish allergy, peanut-containing drug products, codeine, diphtheria toxoid, and tetanus toxoid-containing vaccines. Drug: Kimberly [name]  reports no history of drug  use. Alcohol:  reports current alcohol use. Tobacco:  reports that she has never smoked. She has never used smokeless tobacco. Social: Kimberly [name]  reports that she has never smoked. She has never used smokeless tobacco. She reports current alcohol use. She reports that she does not use drugs. Medical:  has a past medical history of Anxiety, Arthritis, Asthma, Balance problem, Brain cyst, Carpal tunnel syndrome, bilateral, Cervical fusion syndrome, Complete rupture of rotator cuff, Complication of anesthesia, Degenerative disc disease, cervical, Depression, Difficult intubation, Flu (08/10/2018), GERD (gastroesophageal reflux disease), Headache, Herpes, History of kidney stones, History of pneumonia, Hyperlipidemia, Hypertension, Hypothyroidism, Inflammation of shoulder joint, Memory difficulties, Neuromuscular disorder (HCC), Occipital neuralgia, Pneumothorax on right, Pseudoarthrosis of cervical spine (HCC), Recurrent falls, Syncope, and Urinary frequency. Surgical: Kimberly [name]  has a past surgical history that includes Abdominal hysterectomy; Rotator cuff repair (Bilateral, 2014); Foreign Body Removal (Left); Carpal tunnel release (Left, 02/16/2013); Carpal tunnel release (Right, 03/27/2013); Cesarean section; Anterior cervical decomp/discectomy fusion (N/A, 08/06/2014); Colonoscopy; Nasal sinus surgery (N/A, 04/13/2015); Shoulder arthroscopy with distal clavicle resection (Left, 05/18/2015); Shoulder acromioplasty (Left, 05/18/2015); Cholecystectomy (N/A, 06/17/2015); Neck surgery (2016); Anterior cervical decomp/discectomy fusion (N/A, 07/04/2016); Posterior cervical fusion/foraminotomy (N/A, 11/13/2016); Knee arthroscopy; Extracorporeal shock wave lithotripsy (Left, 09/04/2018); Colonoscopy with propofol  (N/A, 05/06/2019); Reduction mammaplasty (10/2019); Esophagogastroduodenoscopy (N/A, 05/08/2021); cysto with hydrodistension (N/A, 09/26/2021); Cystoscopy w/ retrogrades (Bilateral, 09/26/2021);  Cystoscopy with biopsy (N/A, 09/26/2021); Ureteroscopy (Bilateral, 09/26/2021); Esophagogastroduodenoscopy (11/25/2017); Xi robotic assisted oophorectomy (N/A, 12/22/2021); and Excision vaginal cyst (N/A, 12/22/2021). Family: family history includes Cancer in her father; Hypertension in her father and another family member.  Laboratory Chemistry Profile   Renal Lab Results  Component Value Date   BUN 11 02/09/2024   CREATININE 0.77 02/09/2024   BCR 10 07/01/2019   GFRAA 86 07/01/2019   GFRNONAA >60 02/09/2024    Hepatic Lab Results  Component Value Date   AST 22 02/09/2024   ALT 13 02/09/2024   ALBUMIN  4.0 02/09/2024   ALKPHOS 87 02/09/2024   LIPASE 25 02/09/2024    Electrolytes Lab Results  Component Value Date   NA 136 02/09/2024   K 3.8 02/09/2024   CL 102 02/09/2024   CALCIUM 9.3 02/09/2024    Bone No results found for: VD25OH, VD125OH2TOT, CI6874NY7, CI7874NY7, 25OHVITD1, 25OHVITD2, 25OHVITD3, TESTOFREE, TESTOSTERONE   Inflammation (CRP: Acute Phase) (ESR: Chronic Phase) Lab Results  Component Value Date   ESRSEDRATE 35 (H) 08/12/2021         Note: Above Lab results reviewed.  Recent Imaging Review  MR BRAIN WO CONTRAST CLINICAL DATA:  Provided history: Imbalance. Memory loss. Syncope, unspecified syncope type. Numbness and tingling. Dizziness.  EXAM: MRI HEAD WITHOUT CONTRAST  TECHNIQUE: Multiplanar, multiecho pulse sequences of the  brain and surrounding structures were obtained without intravenous contrast.  COMPARISON:  Head CT 10/25/2023.  Brain MRI 03/20/2019.  FINDINGS: Brain:  No age-advanced or lobar predominant cerebral atrophy.  Moderate for age multifocal T2 FLAIR hyperintense signal abnormality within the cerebral white matter, nonspecific.  Prominent perivascular space again demonstrated within the left basal ganglia inferiorly.  No cortical encephalomalacia is identified.  There is no acute infarct.  No evidence  of an intracranial mass.  No chronic intracranial blood products.  No extra-axial fluid collection.  No midline shift.  Vascular: Maintained flow voids within the proximal large arterial vessels.  Skull and upper cervical spine: No focal worrisome marrow lesion. Susceptibility artifact arising from cervical spinal fusion hardware.  Sinuses/Orbits: No mass or acute finding within the imaged orbits. No significant paranasal sinus disease.  IMPRESSION: 1. No evidence of an acute intracranial abnormality. 2. Moderate for age multifocal T2 FLAIR hyperintense signal abnormality within the cerebral white matter, similar to the prior brain MRI of 03/20/2019. Findings are nonspecific, but most often secondary to chronic small vessel ischemia. Although not excluded, the appearance does not strongly suggest demyelinating disease.  Electronically Signed   By: Rockey Childs D.O.   On: 01/02/2024 08:29 Note: Reviewed        Physical Exam  Vitals: BP 116/78 (BP Location: Right Arm, Patient Position: Sitting, Cuff Size: Normal)   Pulse (!) 104   Temp (!) 97.1 F (36.2 C) (Temporal)   Resp 18   Ht 5' 1 (1.549 m)   Wt 156 lb (70.8 kg)   SpO2 97%   BMI 29.48 kg/m  BMI: Estimated body mass index is 29.48 kg/m as calculated from the following:   Height as of this encounter: 5' 1 (1.549 m).   Weight as of this encounter: 156 lb (70.8 kg). Ideal: Ideal body weight: 47.8 kg (105 lb 6.1 oz) Adjusted ideal body weight: 57 kg (125 lb 10 oz) General appearance: Well nourished, well developed, and well hydrated. In no apparent acute distress Mental status: Alert, oriented x 3 (person, place, & time)       Respiratory: No evidence of acute respiratory distress Eyes: PERLA  Musculoskeletal: Head pain (headache) Assessment   Diagnosis Status  1. Medication management   2. Bilateral occipital neuralgia   3. Chronic pain syndrome   4. Cervical spondylosis without myelopathy   5. Cervical  fusion syndrome (C2-T1)   6. Cervical myofascial pain syndrome   7. Hx of cervical spine surgery   8. Degenerative disc disease, cervical    Controlled Controlled Controlled   Updated Problems: No problems updated.  Plan of Care  Problem-specific:  Assessment and Plan    Chronic post-surgical cervical and occipital pain Chronic pain post-surgery, increased intensity. Managed with gabapentin  and oxycodone .  - Continue gabapentin  600 mg three times daily. - Continue oxycodone  every 6 hours as needed for pain. - Ensure adherence to prescribed medication schedule. - She has an upcoming appointment with neurology, where she will discuss obtaining a CT scan for further evaluation of her headaches  Cervical spondylosis without myelopathy or radiculopathy Persistent pain post-cervical surgeries at C6, C7, C8 levels. - Evaluate CT scan results with neurosurgeon to assess cervical spine status.  Left hip and lower back pain Left hip pain radiating to lower back, activity-related. Managed with meloxicam . Physical therapy planned. - Continue meloxicam . - Attend physical therapy sessions. - Re-evaluate hip and back pain post-therapy. - Consider hip x-ray and possible injections if pain persists.  Chronic pain syndrome: Patient's pain is well-controlled with oxycodone  and gabapentin , will continue on current medication regimen.  Prescribing drug monitoring (PDMP) reviewed, findings consistent with the use of prescribed medication and no evidence of narcotic misuse or abuse.  Urine drug screening (UDS) up to date and consistent with the use of prescribed medication.  The patient was advised to drink more water  to prevent from opioid induced constipation.  The patient was also advised to ensure adherence to prescribed medication as scheduled.   Schedule follow-up in 90 days for medication management.      Kimberly [name] has a current medication list which includes the following  long-term medication(s): bupropion and gabapentin .  Pharmacotherapy (Medications Ordered): Meds ordered this encounter  Medications   oxyCODONE  (OXY IR/ROXICODONE ) 5 MG immediate release tablet    Sig: Take 1 tablet (5 mg total) by mouth every 6 (six) hours as needed for severe pain (pain score 7-10). Must last 30 days.    Dispense:  120 tablet    Refill:  0    Chronic Pain: STOP Act (Not applicable) Fill 1 day early if closed on refill date. Avoid benzodiazepines within 8 hours of opioids   oxyCODONE  (OXY IR/ROXICODONE ) 5 MG immediate release tablet    Sig: Take 1 tablet (5 mg total) by mouth every 6 (six) hours as needed for severe pain (pain score 7-10). Must last 30 days.    Dispense:  120 tablet    Refill:  0    Chronic Pain: STOP Act (Not applicable) Fill 1 day early if closed on refill date. Avoid benzodiazepines within 8 hours of opioids   oxyCODONE  (OXY IR/ROXICODONE ) 5 MG immediate release tablet    Sig: Take 1 tablet (5 mg total) by mouth every 6 (six) hours as needed for severe pain (pain score 7-10). Must last 30 days.    Dispense:  120 tablet    Refill:  0    Chronic Pain: STOP Act (Not applicable) Fill 1 day early if closed on refill date. Avoid benzodiazepines within 8 hours of opioids   gabapentin  (NEURONTIN ) 600 MG tablet    Sig: Take 1 tablet (600 mg total) by mouth 3 (three) times daily.    Dispense:  90 tablet    Refill:  5   Orders:  No orders of the defined types were placed in this encounter.       Return in about 3 months (around 08/11/2024) for (F2F), (MM), Emmy Blanch NP.    Recent Visits Date Type Provider Dept  02/17/24 Office Visit Bay Wayson K, NP [facility]  Showing recent visits within past 90 days and meeting all other requirements Today's Visits Date Type Provider Dept  05/11/24 Office Visit Damiah Mcdonald K, NP [facility]  Showing today's visits and meeting all other requirements Future Appointments Date Type Provider  Dept  08/03/24 Appointment Jordyan Hardiman K, NP [facility]  Showing future appointments within next 90 days and meeting all other requirements  I discussed the assessment and treatment plan with the patient. The patient was provided an opportunity to ask questions and all were answered. The patient agreed with the plan and demonstrated an understanding of the instructions.  Patient advised to call back or seek an in-person evaluation if the symptoms or condition worsens.  I personally spent a total of 30 minutes in the care of the patient today including preparing to see the patient, getting/reviewing separately obtained history, performing a medically appropriate exam/evaluation,  counseling and educating, placing orders, referring and communicating with other health care professionals, documenting clinical information in the EHR, independently interpreting results, communicating results, and coordinating care.   Note by: Arlyne Brandes K Rajah Tagliaferro, NP (TTS and AI technology used. I apologize for any typographical errors that were not detected and corrected.) Date: 05/11/2024; Time: 12:36 PM

## 2024-05-12 ENCOUNTER — Encounter: Payer: Self-pay | Admitting: Physician Assistant

## 2024-05-12 ENCOUNTER — Ambulatory Visit

## 2024-05-12 ENCOUNTER — Ambulatory Visit (INDEPENDENT_AMBULATORY_CARE_PROVIDER_SITE_OTHER): Admitting: Physician Assistant

## 2024-05-12 VITALS — BP 112/82 | Ht 61.0 in | Wt 159.0 lb

## 2024-05-12 DIAGNOSIS — Z9889 Other specified postprocedural states: Secondary | ICD-10-CM

## 2024-05-12 DIAGNOSIS — R2689 Other abnormalities of gait and mobility: Secondary | ICD-10-CM | POA: Diagnosis not present

## 2024-05-12 DIAGNOSIS — M542 Cervicalgia: Secondary | ICD-10-CM | POA: Diagnosis not present

## 2024-05-19 ENCOUNTER — Ambulatory Visit
Admission: RE | Admit: 2024-05-19 | Discharge: 2024-05-19 | Disposition: A | Discharge status: Home / Self Care | Source: Ambulatory Visit | Attending: Physician Assistant | Admitting: Physician Assistant

## 2024-05-19 DIAGNOSIS — M542 Cervicalgia: Secondary | ICD-10-CM

## 2024-05-19 DIAGNOSIS — Z9889 Other specified postprocedural states: Secondary | ICD-10-CM

## 2024-05-20 ENCOUNTER — Ambulatory Visit: Admitting: Physical Therapy

## 2024-05-21 ENCOUNTER — Ambulatory Visit: Payer: Self-pay | Admitting: Physician Assistant

## 2024-06-01 ENCOUNTER — Ambulatory Visit (INDEPENDENT_AMBULATORY_CARE_PROVIDER_SITE_OTHER): Admitting: Physician Assistant

## 2024-06-01 DIAGNOSIS — M542 Cervicalgia: Secondary | ICD-10-CM | POA: Diagnosis not present

## 2024-06-01 DIAGNOSIS — Z9889 Other specified postprocedural states: Secondary | ICD-10-CM | POA: Diagnosis not present

## 2024-06-01 NOTE — Progress Notes (Signed)
 Discussed patients recent MRI results with her.  The MRI did not show any significant spinal canal or neuroforaminal stenosis at the operative levels.  She does have some chronic degenerative changes.  With her proximal cervical pain, would like her to have a second opinion from pain doctor to discuss potential injection options for her.  She has had injections in the past, but is willing to try them again.  I am happy to place this referral for her.  She is doing physical therapy but it is also for her hip and they states that they can only work on 1 body part at a time.  They do plan to work on her neck after her hip.  Referral was already previously sent.  Happy to send again if they need.  This visit was performed via telephone.  Patient location: home Provider location: office  I spent a total of 10 minutes non-face-to-face activities for this visit on the date of this encounter including review of current clinical condition and response to treatment.  The patient is aware of and accepts the limits of this telehealth visit.     EXAM: MRI CERVICAL SPINE WITHOUT CONTRAST 05/19/2024 11:00:30 AM   TECHNIQUE: Multiplanar multisequence MRI of the cervical spine was performed.   COMPARISON: MRI of the cervical spine dated 04/29/2023 and plain radiographs of the cervical spine dated 05/12/2024.   CLINICAL HISTORY: Neck pain, acute, prior cervical surgery; Neck pain, chronic, degenerative changes on xray; previous fusion, proximal neck pain. Acute chronic neck pain radiating to top of head. S/P cervical fusion. Pt reported couple of falls without injury and she has been feeling more unsteady on her feet. Duration: 6 years, pain worsened the last several months; No recent injury. Pt reported injury prior to cervical fusion. No ca hx. Injections; Previous scans in PACS.   FINDINGS:   BONES AND ALIGNMENT: There is some straightening of the normal cervical lordosis. The patient is again  noted to be status post ACDF C2-C3 and C5-C7. The patient is also status post bilateral posterolateral spinal fusion of C2-C7. There is artifact related to the hardware, as before. Normal vertebral body heights. Bone marrow signal is unremarkable. There is slight degenerative anterolisthesis at C7-T1. There is mild chronic degenerative disc disease at T1-T2 and T2-T3.   SPINAL CORD: The spinal cord appears normal in morphology and signal intensity.   SOFT TISSUES: The paraspinous soft tissues are unremarkable.   C2-C3: No significant disc herniation. No spinal canal stenosis or neural foraminal narrowing.   C3-C4: No significant disc herniation. No spinal canal stenosis or neural foraminal narrowing.   C4-C5: No significant disc herniation. No spinal canal stenosis or neural foraminal narrowing.   C5-C6: No significant disc herniation. No spinal canal stenosis or neural foraminal narrowing.   C6-C7: No significant disc herniation. No spinal canal stenosis or neural foraminal narrowing.   C7-T1: There is slight degenerative anterolisthesis at C7-T1. No significant disc herniation. No spinal canal stenosis or neural foraminal narrowing.   IMPRESSION: 1. Status post ACDF at C2-3 and C5-C7 and bilateral posterolateral spinal fusion of C2-C7 with associated artifact. No significant spinal canal or neural foraminal stenosis at the operative levels. 2. Slight degenerative anterolisthesis at C7-T1. 3. Mild chronic degenerative disc disease at T1-2 and T2-3.

## 2024-06-04 IMAGING — MG DIGITAL DIAGNOSTIC BILAT W/ TOMO W/ CAD
8 of 14 series · 8 of 40 positions shown · non-contrast
Comparison: Previous exam(s).

CLINICAL DATA: Nonfocal bilateral breast pain since breast
reduction in 0906. Not significantly changed since prior diagnostic
mammogram in 8988.

EXAM:
DIGITAL DIAGNOSTIC BILATERAL MAMMOGRAM WITH TOMOSYNTHESIS AND CAD
TECHNIQUE: Bilateral digital diagnostic mammography and breast tomosynthesis
was performed. The images were evaluated with computer-aided
detection.

[R MLO synth-2D (1 of 2)]
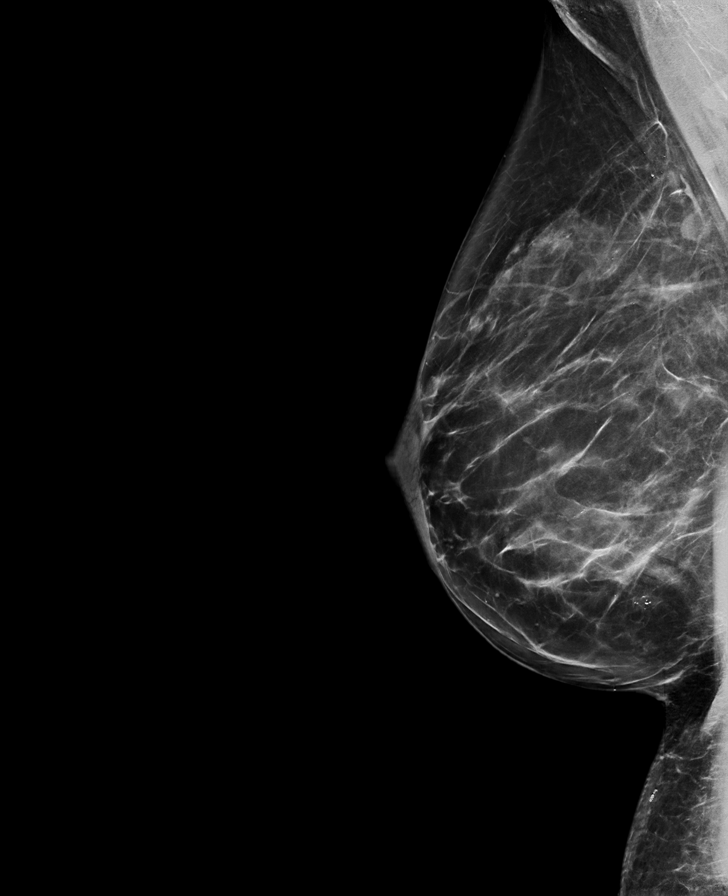

[L MLO synth-2D (1 of 2)]
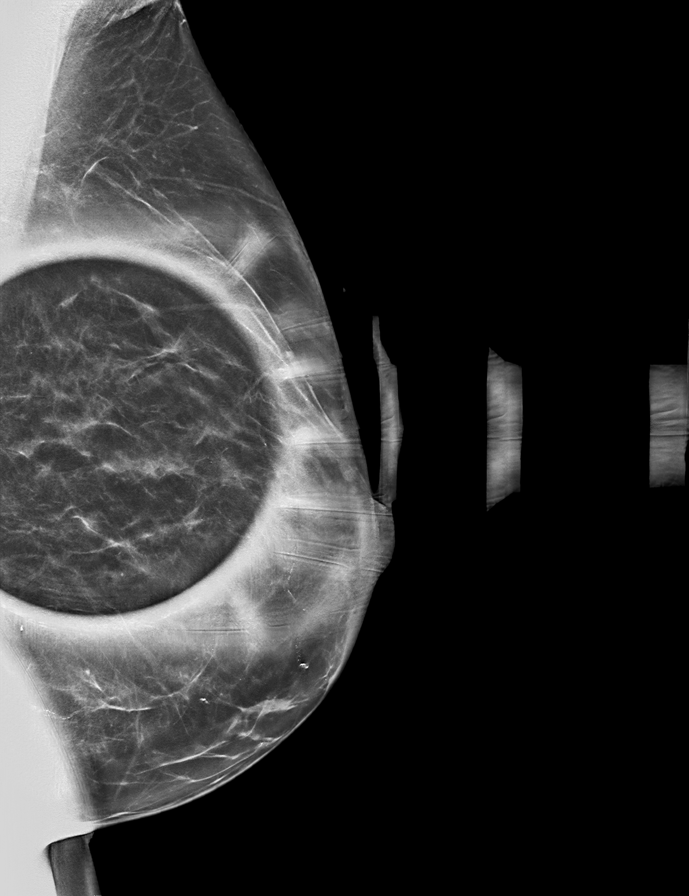

[R MLO synth-2D (2 of 2)]
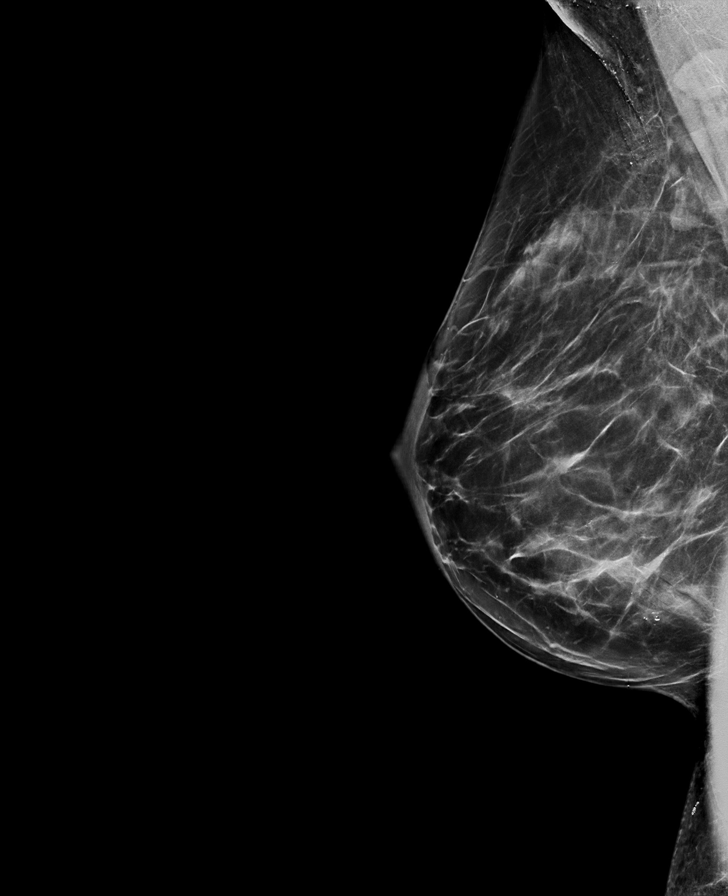

[L CC synth-2D (1 of 2)]
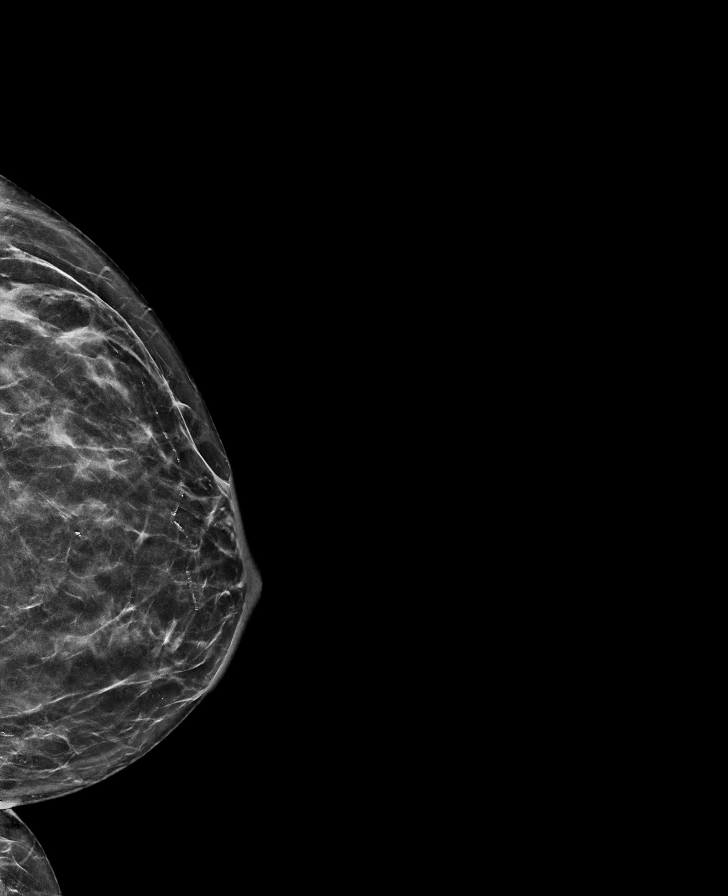

[R CC synth-2D]
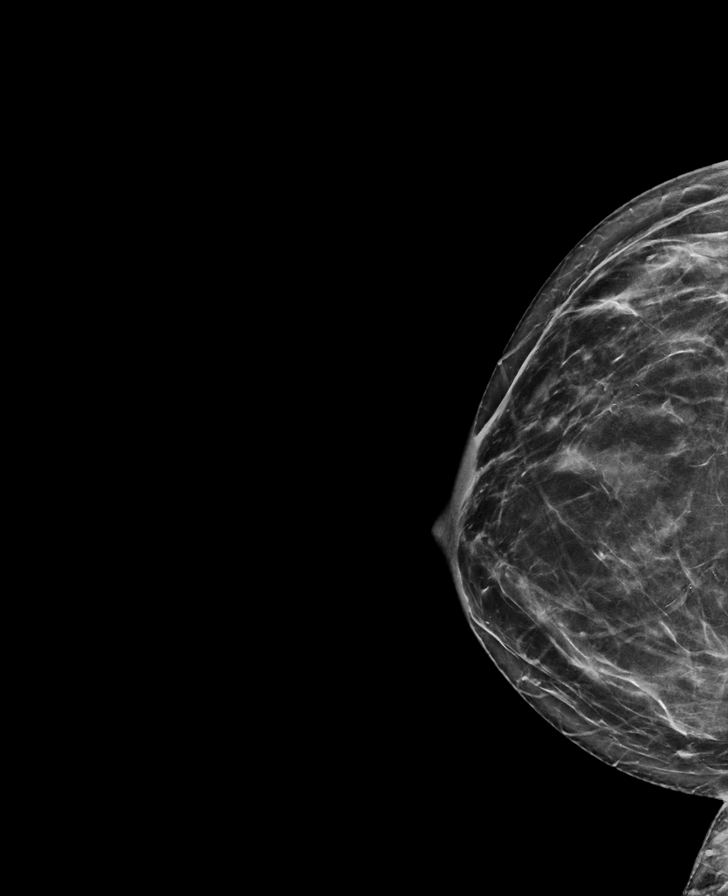

[L CC synth-2D (2 of 2)]
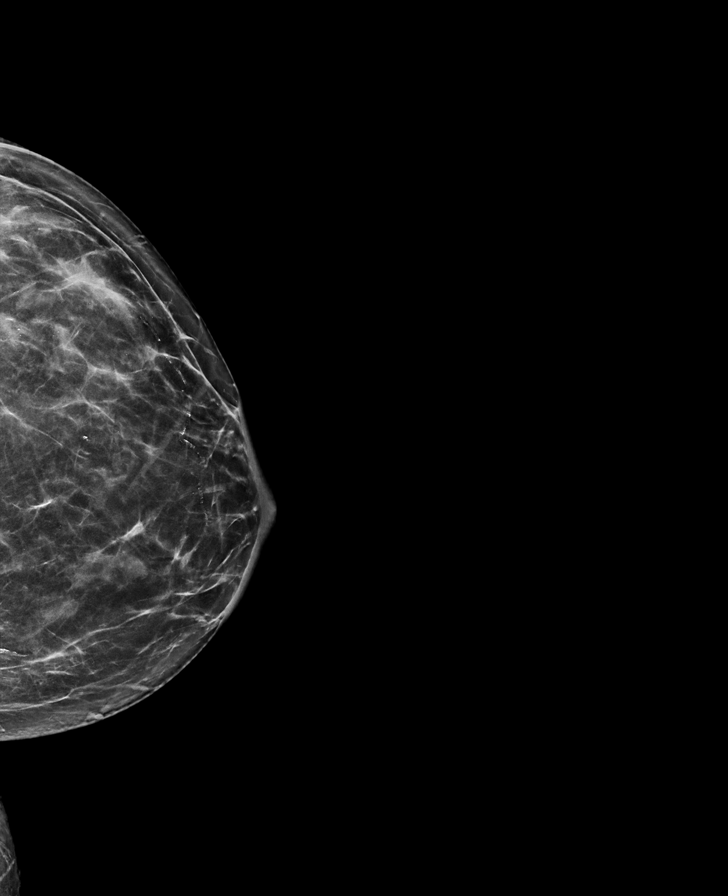

[L MLO synth-2D (2 of 2)]
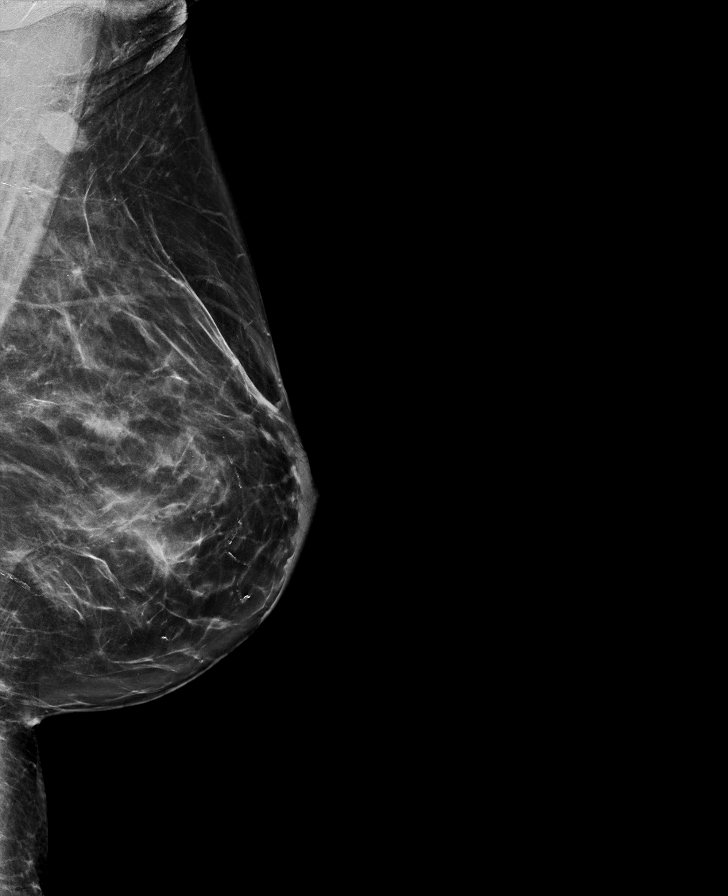

[R CC tomo · tomo slice 37/72.0]
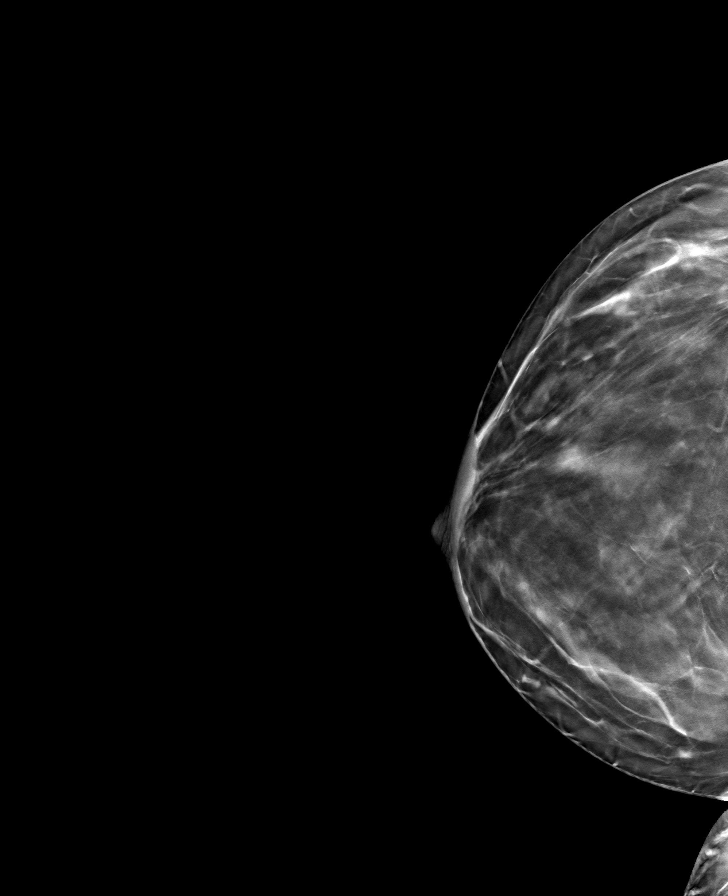

[8 of 40 positions shown; findings below may reference images not displayed]

ACR Breast Density Category b: There are scattered areas of
fibroglandular density.
FINDINGS: Diagnostic mammographic images were obtained over the area of pain
in the RIGHT breast. No suspicious mammographic finding is
identified in this area. No suspicious mass, microcalcification, or
other finding is identified in the RIGHT breast. No suspicious
mammographic etiology for breast pain identified. Post reduction
changes are noted.

Diagnostic mammographic images were obtained over the area of pain
in the LEFT breast. No suspicious mammographic finding is identified
in this area. A questioned asymmetry resolves with additional views,
consistent with overlapping tissue. No suspicious mass,
microcalcification, or other finding is identified in the LEFT
breast. No suspicious mammographic etiology for breast pain
identified. Post reduction changes are noted.
IMPRESSION: 1. No mammographic evidence of malignancy bilaterally. No suspicious
mammographic etiology for nonfocal bilateral breast pain is
identified. Any further workup of the patient's symptoms should be
based on the clinical assessment. Recommend routine annual screening
mammogram in 1 year.

RECOMMENDATION:
Screening mammogram in one year.(Code:[id number])

I have discussed the findings and recommendations with the patient.
If applicable, a reminder letter will be sent to the patient
regarding the next appointment.

BI-RADS CATEGORY  2: Benign.

## 2024-07-31 ENCOUNTER — Other Ambulatory Visit: Payer: Self-pay | Admitting: Family Medicine

## 2024-07-31 DIAGNOSIS — Z1231 Encounter for screening mammogram for malignant neoplasm of breast: Secondary | ICD-10-CM

## 2024-07-31 NOTE — Progress Notes (Signed)
 PROVIDER NOTE: Interpretation of information contained herein should be left to medically-trained personnel. Specific patient instructions are provided elsewhere under Patient Instructions section of medical record. This document was created in part using AI and STT-dictation technology, any transcriptional errors that may result from this process are unintentional.  Patient: Kimberly [name]  Service: E/M   PCP: Alla Amis, MD  DOB: [birth year]  DOS: 08/03/2024  Provider: Emmy MARLA Blanch, NP  MRN: [id number]  Delivery: Face-to-face  Specialty: Interventional Pain Management  Type: Established Patient  Setting: Ambulatory outpatient facility  Specialty designation: 09  Referring Prov.: Alla Amis, MD  Location: Outpatient office facility       History of present illness (HPI) Kimberly [name], a 64 y.o. year old female, is here today because of her Cervical myofascial pain syndrome [M79.18]. Kimberly [name] primary complain today is Neck Pain (Back of head ) and Other (Pain behind and around left eye. )  Pertinent problems: Kimberly [name] has SHOULDER PAIN; IMPINGEMENT SYNDROME; RUPTURE ROTATOR CUFF; S/P carpal tunnel release; CTS (carpal tunnel syndrome); Cervical spondylosis without myelopathy; Tight fascia; Decreased range of motion of shoulder; Cervical stenosis of spinal canal; GERD (gastroesophageal reflux disease); Pseudoarthrosis of cervical spine (HCC); Bilateral hand pain; Degenerative disc disease, cervical; Chronic tension-type headache, not intractable; Numbness and tingling; Vitamin D deficiency; Cervical fusion syndrome; Hx of cervical spine surgery; Chronic pain syndrome; Cervicalgia; Medication management; Depression, major, recurrent, mild; Pelvic pain in female; Cervical myofascial pain syndrome; Occipital headache; and Bilateral occipital neuralgia on their pertinent problem list.  Pain Assessment: Severity of Chronic pain is reported as a 7 /10. Location: Neck Mid, Left,  Right/from the back of the skull sometimes it radiates to the left and some to the right, flucuates. Onset: More than a month ago. Quality: Throbbing, Discomfort, Constant, Pressure (pulling sensation). Timing: Constant. Modifying factor(s): medications help some. Vitals:  height is 5' 1 (1.549 m) and weight is 154 lb (69.9 kg). Her temporal temperature is 97.3 F (36.3 C) (abnormal). Her blood pressure is 130/91 (abnormal) and her pulse is 89. Her respiration is 16 and oxygen saturation is 97%.  BMI: Estimated body mass index is 29.1 kg/m as calculated from the following:   Height as of this encounter: 5' 1 (1.549 m).   Weight as of this encounter: 154 lb (69.9 kg).  Last encounter: 05/11/2024. Last procedure: Visit date not found.  Reason for encounter: evaluation for possible interventional PM therapy/treatment.   Discussed the use of AI scribe software for clinical note transcription with the patient, who gave verbal consent to proceed.  History of Present Illness   Kimberly [name] is a 64 year old female who presents with excruciating daily pain and a headache and cervical myofascial pain syndrome.   She experiences excruciating pain daily, significantly impacting her ability to function. The headache is constant and located over her left eye, described as feeling 'straight through' the eye. An eye doctor diagnosed her with cataracts but did not attribute the pain to this condition. Some pressure in the eye was noted, and she was prescribed eye drops, though the specific pressure level was not communicated.  We also discussed occipital nerve block for her ongoing headache and neck pain.   She has a history of neck pain and underwent a cervical facet block in 2021, which did not provide relief. The pain is located in the back of her neck, with spasms and radiation down both sides. She has not tried trigger point injections before  for cervical myofascial pain syndrome.   She is currently  using oxycodone  and has a refill available.     Pharmacotherapy Assessment   Monitoring: [place] PMP: PDMP reviewed during this encounter.       Pharmacotherapy: No side-effects or adverse reactions reported. Compliance: No problems identified. Effectiveness: Clinically acceptable.  No notes on file  UDS:  Summary  Date Value Ref Range Status  02/19/2024 FINAL  Final    Comment:    ==================================================================== [employer] [facility] 13 (MW) ==================================================================== Test                             Result       Flag       Units  Drug Present   Oxycodone                       685                     ng/mg creat   Oxymorphone                    68                      ng/mg creat   Noroxycodone                   3757                    ng/mg creat    Sources of oxycodone  include scheduled prescription medications.    Oxymorphone and noroxycodone are expected metabolites of oxycodone .    Oxymorphone is also available as a scheduled prescription medication.  ==================================================================== Test                      Result    Flag   Units      Ref Range   Creatinine              149              mg/dL      >=79 ==================================================================== Declared Medications:  Medication list was not provided. ==================================================================== For clinical consultation, please call [phone]. ====================================================================     No results found for: CBDTHCR No results found for: D8THCCBX No results found for: D9THCCBX  ROS  Constitutional: Denies any fever or chills Gastrointestinal: No reported hemesis, hematochezia, vomiting, or acute GI distress Musculoskeletal: Cervical myofascial pain syndrome, headache, neck pain Neurological: No reported episodes of  acute onset apraxia, aphasia, dysarthria, agnosia, amnesia, paralysis, loss of coordination, or loss of consciousness  Medication Review  NONFORMULARY OR COMPOUNDED ITEM, Naphazoline-Pheniramine, Uro-MP , acetaminophen , amLODipine , bimatoprost, buPROPion, cholecalciferol , conjugated estrogens , cyclobenzaprine , escitalopram , estradiol , fluconazole , gabapentin , hydrOXYzine , ibuprofen , latanoprost, liothyronine , meloxicam , methocarbamol , naloxone, ondansetron , oxyCODONE , pantoprazole , phenazopyridine , rizatriptan, timolol, and valACYclovir   History Review  Allergy: Kimberly [name] is allergic to shellfish allergy, peanut-containing drug products, codeine, diphtheria toxoid, and tetanus toxoid-containing vaccines. Drug: Kimberly [name]  reports no history of drug use. Alcohol:  reports current alcohol use. Tobacco:  reports that she has never smoked. She has never used smokeless tobacco. Social: Kimberly [name]  reports that she has never smoked. She has never used smokeless tobacco. She reports current alcohol use. She reports that she does not use drugs. Medical:  has a past medical history of Anxiety, Arthritis, Asthma, Balance problem, Brain cyst, Carpal tunnel syndrome,  bilateral, Cervical fusion syndrome, Complete rupture of rotator cuff, Complication of anesthesia, Degenerative disc disease, cervical, Depression, Difficult intubation, Flu (08/10/2018), GERD (gastroesophageal reflux disease), Headache, Herpes, History of kidney stones, History of pneumonia, Hyperlipidemia, Hypertension, Hypothyroidism, Inflammation of shoulder joint, Memory difficulties, Neuromuscular disorder (HCC), Occipital neuralgia, Pneumothorax on right, Pseudoarthrosis of cervical spine (HCC), Recurrent falls, Syncope, and Urinary frequency. Surgical: Kimberly [name]  has a past surgical history that includes Abdominal hysterectomy; Rotator cuff repair (Bilateral, 2014); Foreign Body Removal (Left); Carpal tunnel release (Left, 02/16/2013);  Carpal tunnel release (Right, 03/27/2013); Cesarean section; Anterior cervical decomp/discectomy fusion (N/A, 08/06/2014); Colonoscopy; Nasal sinus surgery (N/A, 04/13/2015); Shoulder arthroscopy with distal clavicle resection (Left, 05/18/2015); Shoulder acromioplasty (Left, 05/18/2015); Cholecystectomy (N/A, 06/17/2015); Neck surgery (2016); Anterior cervical decomp/discectomy fusion (N/A, 07/04/2016); Posterior cervical fusion/foraminotomy (N/A, 11/13/2016); Knee arthroscopy; Extracorporeal shock wave lithotripsy (Left, 09/04/2018); Colonoscopy with propofol  (N/A, 05/06/2019); Reduction mammaplasty (10/2019); Esophagogastroduodenoscopy (N/A, 05/08/2021); cysto with hydrodistension (N/A, 09/26/2021); Cystoscopy w/ retrogrades (Bilateral, 09/26/2021); Cystoscopy with biopsy (N/A, 09/26/2021); Ureteroscopy (Bilateral, 09/26/2021); Esophagogastroduodenoscopy (11/25/2017); Xi robotic assisted oophorectomy (N/A, 12/22/2021); and Excision vaginal cyst (N/A, 12/22/2021). Family: family history includes Cancer in her father; Hypertension in her father and another family member.  Laboratory Chemistry Profile   Renal Lab Results  Component Value Date   BUN 11 02/09/2024   CREATININE 0.77 02/09/2024   BCR 10 07/01/2019   GFRAA 86 07/01/2019   GFRNONAA >60 02/09/2024    Hepatic Lab Results  Component Value Date   AST 22 02/09/2024   ALT 13 02/09/2024   ALBUMIN  4.0 02/09/2024   ALKPHOS 87 02/09/2024   LIPASE 25 02/09/2024    Electrolytes Lab Results  Component Value Date   NA 136 02/09/2024   K 3.8 02/09/2024   CL 102 02/09/2024   CALCIUM 9.3 02/09/2024    Bone No results found for: VD25OH, VD125OH2TOT, CI6874NY7, CI7874NY7, 25OHVITD1, 25OHVITD2, 25OHVITD3, TESTOFREE, TESTOSTERONE   Inflammation (CRP: Acute Phase) (ESR: Chronic Phase) Lab Results  Component Value Date   ESRSEDRATE 35 (H) 08/12/2021         Note: Above Lab results reviewed.  Recent Imaging Review  MR  CERVICAL SPINE WO CONTRAST EXAM: MRI CERVICAL SPINE WITHOUT CONTRAST 05/19/2024 11:00:30 AM  TECHNIQUE: Multiplanar multisequence MRI of the cervical spine was performed.  COMPARISON: MRI of the cervical spine dated 04/29/2023 and plain radiographs of the cervical spine dated 05/12/2024.  CLINICAL HISTORY: Neck pain, acute, prior cervical surgery; Neck pain, chronic, degenerative changes on xray; previous fusion, proximal neck pain. Acute chronic neck pain radiating to top of head. S/P cervical fusion. Pt reported couple of falls without injury and she has been feeling more unsteady on her feet. Duration: 6 years, pain worsened the last several months; No recent injury. Pt reported injury prior to cervical fusion. No ca hx. Injections; Previous scans in PACS.  FINDINGS:  BONES AND ALIGNMENT: There is some straightening of the normal cervical lordosis. The patient is again noted to be status post ACDF C2-C3 and C5-C7. The patient is also status post bilateral posterolateral spinal fusion of C2-C7. There is artifact related to the hardware, as before. Normal vertebral body heights. Bone marrow signal is unremarkable. There is slight degenerative anterolisthesis at C7-T1. There is mild chronic degenerative disc disease at T1-T2 and T2-T3.  SPINAL CORD: The spinal cord appears normal in morphology and signal intensity.  SOFT TISSUES: The paraspinous soft tissues are unremarkable.  C2-C3: No significant disc herniation. No spinal canal stenosis or neural foraminal narrowing.  C3-C4: No significant  disc herniation. No spinal canal stenosis or neural foraminal narrowing.  C4-C5: No significant disc herniation. No spinal canal stenosis or neural foraminal narrowing.  C5-C6: No significant disc herniation. No spinal canal stenosis or neural foraminal narrowing.  C6-C7: No significant disc herniation. No spinal canal stenosis or neural foraminal narrowing.  C7-T1: There  is slight degenerative anterolisthesis at C7-T1. No significant disc herniation. No spinal canal stenosis or neural foraminal narrowing.  IMPRESSION: 1. Status post ACDF at C2-3 and C5-C7 and bilateral posterolateral spinal fusion of C2-C7 with associated artifact. No significant spinal canal or neural foraminal stenosis at the operative levels. 2. Slight degenerative anterolisthesis at C7-T1. 3. Mild chronic degenerative disc disease at T1-2 and T2-3.  Electronically signed by: Evalene Coho MD 05/21/2024 10:04 AM EDT RP Workstation: [id number] Note: Reviewed        Physical Exam  Vitals: BP (!) 130/91 (BP Location: Right Arm, Patient Position: Sitting, Cuff Size: Normal)   Pulse 89   Temp (!) 97.3 F (36.3 C) (Temporal)   Resp 16   Ht 5' 1 (1.549 m)   Wt 154 lb (69.9 kg)   SpO2 97%   BMI 29.10 kg/m  BMI: Estimated body mass index is 29.1 kg/m as calculated from the following:   Height as of this encounter: 5' 1 (1.549 m).   Weight as of this encounter: 154 lb (69.9 kg). Ideal: Ideal body weight: 47.8 kg (105 lb 6.1 oz) Adjusted ideal body weight: 56.6 kg (124 lb 13.2 oz) General appearance: Well nourished, well developed, and well hydrated. In no apparent acute distress Mental status: Alert, oriented x 3 (person, place, & time)       Respiratory: No evidence of acute respiratory distress Eyes: PERLA  Musculoskeletal: Neck pain Headache Cervical Spine Exam  Skin & Axial Inspection: No masses, redness, edema, swelling, or associated skin lesions Alignment: Symmetrical Functional ROM: Pain restricted ROM, bilaterally Stability: No instability detected Muscle Tone/Strength: Functionally intact. No obvious neuro-muscular anomalies detected. Sensory (Neurological): Unimpaired Palpation: (+)       for bilateral occipital neuralgia Assessment   Diagnosis Status  1. Cervical myofascial pain syndrome   2. Bilateral occipital neuralgia   3. Occipital headache   4.  Cervical fusion syndrome (C2-T1)   5. Chronic pain syndrome   6. Cervical spondylosis without myelopathy   7. Hx of cervical spine surgery    Persistent Persistent Persistent   Updated Problems: No problems updated.  Plan of Care  Problem-specific:  Assessment and Plan    Cervical myofascial pain syndrome Chronic neck pain with spasms, unresponsive to previous cervical facet block. Bilateral pain in neck and shoulders affecting functionality. - Perform bilateral cervical trigger point injections with local anesthesia. - Schedule procedure for on Wednesday.  Chronic headache syndrome Persistent headache over left eye with pressure. Cataracts present, no acute ocular pathology. Pain significantly impacts daily life.   Plan: (Clinic): (B/L) Occipital NB #1 and C-TPI #1 with (Sedation) Dr. Marcelino       Kimberly [name] has a current medication list which includes the following long-term medication(s): bupropion and gabapentin .  Pharmacotherapy (Medications Ordered): No orders of the defined types were placed in this encounter.  Orders:  Orders Placed This Encounter  Procedures   TRIGGER POINT INJECTION    Standing Status:   Future    Expiration Date:   08/03/2025    Scheduling Instructions:     Area: Cervical TPI      Side: Bilateral  Sedation: Patient's choice.     Timeframe: ASAA    Where will this procedure be performed?:   [facility]   GREATER OCCIPITAL NERVE BLOCK    Standing Status:   Future    Expiration Date:   11/01/2024    Scheduling Instructions:     Procedure: Occipital nerve block     Laterality: Bilateral     Sedation: With Sedation.     Timeframe: ASAA    Where will this procedure be performed?:   [facility]        Return in about 2 days (around 08/05/2024) for (Clinic): (B/L) Occipital NB #1 and C-TPI #1 with (Sedation) Dr. Marcelino .    Recent Visits Date Type Provider Dept  05/11/24 Office Visit Carnetta Losada K, NP  [facility]  Showing recent visits within past 90 days and meeting all other requirements Today's Visits Date Type Provider Dept  08/03/24 Office Visit Mamoudou Mulvehill K, NP [facility]  Showing today's visits and meeting all other requirements Future Appointments Date Type Provider Dept  08/05/24 Appointment Marcelino Nurse, MD [facility]  08/06/24 Appointment Marcelino Nurse, MD [facility]  Showing future appointments within next 90 days and meeting all other requirements  I discussed the assessment and treatment plan with the patient. The patient was provided an opportunity to ask questions and all were answered. The patient agreed with the plan and demonstrated an understanding of the instructions.  Patient advised to call back or seek an in-person evaluation if the symptoms or condition worsens.  I personally spent a total of 20 minutes in the care of the patient today including preparing to see the patient, getting/reviewing separately obtained history, performing a medically appropriate exam/evaluation, counseling and educating, placing orders, referring and communicating with other health care professionals, documenting clinical information in the EHR, independently interpreting results, communicating results, and coordinating care.   Note by: Esaias Cleavenger K Mersadie Kavanaugh, NP (TTS and AI technology used. I apologize for any typographical errors that were not detected and corrected.) Date: 08/03/2024; Time: 3:46 PM

## 2024-08-03 ENCOUNTER — Encounter: Payer: Self-pay | Admitting: Nurse Practitioner

## 2024-08-03 ENCOUNTER — Ambulatory Visit: Attending: Nurse Practitioner | Admitting: Nurse Practitioner

## 2024-08-03 VITALS — BP 130/91 | HR 89 | Temp 97.3°F | Resp 16 | Ht 61.0 in | Wt 154.0 lb

## 2024-08-03 DIAGNOSIS — M47812 Spondylosis without myelopathy or radiculopathy, cervical region: Secondary | ICD-10-CM | POA: Insufficient documentation

## 2024-08-03 DIAGNOSIS — M5481 Occipital neuralgia: Secondary | ICD-10-CM | POA: Diagnosis not present

## 2024-08-03 DIAGNOSIS — R519 Headache, unspecified: Secondary | ICD-10-CM | POA: Insufficient documentation

## 2024-08-03 DIAGNOSIS — Q761 Klippel-Feil syndrome: Secondary | ICD-10-CM | POA: Insufficient documentation

## 2024-08-03 DIAGNOSIS — G894 Chronic pain syndrome: Secondary | ICD-10-CM | POA: Diagnosis not present

## 2024-08-03 DIAGNOSIS — M7918 Myalgia, other site: Secondary | ICD-10-CM | POA: Insufficient documentation

## 2024-08-03 DIAGNOSIS — Z9889 Other specified postprocedural states: Secondary | ICD-10-CM | POA: Diagnosis not present

## 2024-08-03 NOTE — Patient Instructions (Addendum)
 Trigger Point Injection Trigger points are areas where you have pain. A trigger point injection is a shot given in the trigger point to help relieve pain for a few days to a few months. Common places for trigger points include the neck, shoulders, upper back, or lower back. A trigger point injection will not cure long-term (chronic) pain permanently. These injections do not always work for every person. For some people, they can help to relieve pain for a few days to a few months. Tell a health care provider about: Any allergies you have. All medicines you are taking, including vitamins, herbs, eye drops, creams, and over-the-counter medicines. Any problems you or family members have had with anesthetic medicines. Any bleeding problems you have. Any surgeries you have had. Any medical conditions you have. Whether you are pregnant or may be pregnant. What are the risks? Generally, this is a safe procedure. However, problems may occur, including: Infection. Bleeding or bruising. Allergic reaction to the injected medicine. Irritation of the skin around the injection site. What happens before the procedure? Ask your health care provider about: Changing or stopping your regular medicines. This is especially important if you are taking diabetes medicines or blood thinners. Taking medicines such as aspirin  and ibuprofen . These medicines can thin your blood. Do not take these medicines unless your health care provider tells you to take them. Taking over-the-counter medicines, vitamins, herbs, and supplements. What happens during the procedure?  Your health care provider will feel for trigger points. A marker may be used to circle the area for the injection. The skin over the trigger point will be washed with a germ-killing soap. You may be given a medicine to help you relax (sedative). A thin needle is used for the injection. You may feel pain or a twitching feeling when the needle enters your  skin. A numbing solution may be injected into the trigger point. Sometimes a medicine to keep down inflammation is also injected. Your health care provider may move the needle around the area where the trigger point is located until the tightness and twitching goes away. After the injection, your health care provider may put gentle pressure over the injection site. The injection site will be covered with a bandage (dressing). The procedure may vary among health care providers and hospitals. What can I expect after treatment? After treatment, you may have soreness and stiffness for 1-2 days. Follow these instructions at home: Injection site care Remove your dressing in a few hours, or as told by your health care provider. Check your injection site every day for signs of infection. Check for: Redness, swelling, or pain. Fluid or blood. Warmth. Pus or a bad smell. Managing pain, stiffness, and swelling If directed, put ice on the affected area. To do this: Put ice in a plastic bag. Place a towel between your skin and the bag. Leave the ice on for 20 minutes, 2-3 times a day. Remove the ice if your skin turns bright red. This is very important. If you cannot feel pain, heat, or cold, you have a greater risk of damage to the area. Activity If you were given a sedative during the procedure, it can affect you for several hours. Do not drive or operate machinery until your health care provider says that it is safe. Do not take baths, swim, or use a hot tub until your health care provider approves. Return to your normal activities as told by your health care provider. Ask your health care provider what  activities are safe for you. General instructions If you were asked to stop your regular medicines, ask your health care provider when you may start taking them again. You may be asked to see an occupational or physical therapist for exercises that reduce muscle strain and stretch the area of the  trigger point. Keep all follow-up visits. This is important. Contact a health care provider if: Your pain comes back, and it is worse than before the injection. You may need more injections. You have chills or a fever. The injection site becomes more painful, red, swollen, or warm to the touch. Summary A trigger point injection is a shot given in the trigger point to help relieve pain. Common places for trigger point injections are the neck, shoulders, upper back, and lower back. These injections do not always work for every person, but for some people, the injections can help to relieve pain for a few days to a few months. Contact a health care provider if symptoms come back or if they are worse than before treatment. Also, get help if the injection site becomes more painful, red, swollen, or warm to the touch. This information is not intended to replace advice given to you by your health care provider. Make sure you discuss any questions you have with your health care provider. Document Revised: 10/25/2020 Document Reviewed: 10/25/2020 [employer] Patient Education  2024 [employer]Occipital Nerve Block Patient Information  Description: The occipital nerves originate in the cervical (neck) spinal cord and travel upward through muscle and tissue to supply sensation to the back of the head and top of the scalp.  In addition, the nerves control some of the muscles of the scalp.  Occipital neuralgia is an irritation of these nerves which can cause headaches, numbness of the scalp, and neck discomfort.     The occipital nerve block will interrupt nerve transmission through these nerves and can relieve pain and spasm.  The block consists of insertion of a small needle under the skin in the back of the head to deposit local anesthetic (numbing medicine) and/or steroids around the nerve.  The entire block usually lasts less than 5 minutes.  Conditions which may be treated by occipital  blocks:  Muscular pain and spasm of the scalp Nerve irritation, back of the head Headaches Upper neck pain  Preparation for the injection:  Do not eat any solid food or dairy products within 8 hours of your appointment. You may drink clear liquids up to 3 hours before appointment.  Clear liquids include water , black coffee, juice or soda.  No milk or cream please. You may take your regular medication, including pain medications, with a sip of water  before you appointment.  Diabetics should hold regular insulin (if taken separately) and take 1/2 normal NPH dose the morning of the procedure.  Carry some sugar containing items with you to your appointment. A driver must accompany you and be prepared to drive you home after your procedure. Bring all your current medications with you. An IV may be inserted and sedation may be given at the discretion of the physician. A blood pressure cuff, EKG, and other monitors will often be applied during the procedure.  Some patients may need to have extra oxygen administered for a short period. You will be asked to provide medical information, including your allergies and medications, prior to the procedure.  We must know immediately if you are taking blood thinners (like Coumadin/Warfarin) or if you are allergic to IV iodine  contrast (  dye).  We must know if you could possible be pregnant.  Do not wear a high collared shirt or turtleneck.  Tie long hair up in the back if possible.  Possible side-effects:  Bleeding from needle site Infection (rare, may require surgery) Nerve injury (rare) Hair on back of neck can be tinged with iodine  scrub (this will wash out) Light-headedness (temporary) Pain at injection site (several days) Decreased blood pressure (rare, temporary) Seizure (very rare)  Call if you experience:  Hives or difficulty breathing ( go to the emergency room) Inflammation or drainage at the injection site(s)  Please note:  Although the  local anesthetic injected can often make your painful muscles or headache feel good for several hours after the injection, the pain may return.  It takes 3-7 days for steroids to work.  You may not notice any pain relief for at least one week.  If effective, we will often do a series of injections spaced 3-6 weeks apart to maximally decrease your pain.  If you have any questions, please call [phone] [facility]  ______________________________________________________________________    Procedure instructions  Stop blood-thinners  Do not eat or drink fluids (other than water ) for 8 hours before your procedure  No water  for 2 hours before your procedure  Take your blood pressure medicine with a sip of water   Arrive 30 minutes before your appointment  If sedation is planned, bring suitable driver. [place], [place], & public transportation are NOT APPROVED)  Carefully read the Preparing for your procedure detailed instructions  If you have questions call us  at [phone]  Procedure appointments are for procedures only.   NO medication refills or new problem evaluations will be done on procedure days.   Only the scheduled, pre-approved procedure and side will be done.   ______________________________________________________________________     ______________________________________________________________________    Preparing for your procedure  Appointments: If you think you may not be able to keep your appointment, call 24-48 hours in advance to cancel. We need time to make it available to others.  Procedure visits are for procedures only. During your procedure appointment there will be: NO Prescription Refills*. NO medication changes or discussions*. NO discussion of disability issues*. NO unrelated pain problem evaluations*. NO evaluations to order other pain procedures*. *These will be addressed at a separate and distinct  evaluation encounter on the provider's evaluation schedule and not during procedure days.  Instructions: Food intake: Avoid eating anything solid for at least 8 hours prior to your procedure. Clear liquid intake: You may take clear liquids such as water  up to 2 hours prior to your procedure. (No carbonated drinks. No soda.) Transportation: Unless otherwise stated by your physician, bring a driver. (Driver cannot be a Market [employer]cher, Pharmacist, Community, or any other form of public transportation.) Morning Medicines: Except for blood thinners, take all of your other morning medications with a sip of water . Make sure to take your heart and blood pressure medicines. If your blood pressure's lower number is above 100, the case will be rescheduled. Blood thinners: Make sure to stop your blood thinners as instructed.  If you take a blood thinner, but were not instructed to stop it, call our office [phone] and ask to talk to a nurse. Not stopping a blood thinner prior to certain procedures could lead to serious complications. Diabetics on insulin: Notify the staff so that you can be scheduled 1st case in the morning. If your diabetes requires high dose insulin, take  only  of your normal insulin dose the morning of the procedure and notify the staff that you have done so. Preventing infections: Shower with an antibacterial soap the morning of your procedure.  Build-up your immune system: Take 1000 mg of Vitamin C with every meal (3 times a day) the day prior to your procedure. Antibiotics: Inform the nursing staff if you are taking any antibiotics or if you have any conditions that may require antibiotics prior to procedures. (Example: recent joint implants)   Pregnancy: If you are pregnant make sure to notify the nursing staff. Not doing so may result in injury to the fetus, including death.  Sickness: If you have a cold, fever, or any active infections, call and cancel or reschedule your procedure. Receiving steroids while  having an infection may result in complications. Arrival: You must be in the facility at least 30 minutes prior to your scheduled procedure. Tardiness: Your scheduled time is also the cutoff time. If you do not arrive at least 15 minutes prior to your procedure, you will be rescheduled.  Children: Do not bring any children with you. Make arrangements to keep them home. Dress appropriately: There is always a possibility that your clothing may get soiled. Avoid long dresses. Valuables: Do not bring any jewelry or valuables.  Reasons to call and reschedule or cancel your procedure: (Following these recommendations will minimize the risk of a serious complication.) Surgeries: Avoid having procedures within 2 weeks of any surgery. (Avoid for 2 weeks before or after any surgery). Flu Shots: Avoid having procedures within 2 weeks of a flu shots or . (Avoid for 2 weeks before or after immunizations). Barium: Avoid having a procedure within 7-10 days after having had a radiological study involving the use of radiological contrast. (Myelograms, Barium swallow or enema study). Heart attacks: Avoid any elective procedures or surgeries for the initial 6 months after a Myocardial Infarction (Heart Attack). Blood thinners: It is imperative that you stop these medications before procedures. Let us  know if you if you take any blood thinner.  Infection: Avoid procedures during or within two weeks of an infection (including chest colds or gastrointestinal problems). Symptoms associated with infections include: Localized redness, fever, chills, night sweats or profuse sweating, burning sensation when voiding, cough, congestion, stuffiness, runny nose, sore throat, diarrhea, nausea, vomiting, cold or Flu symptoms, recent or current infections. It is specially important if the infection is over the area that we intend to treat. Heart and lung problems: Symptoms that may suggest an active cardiopulmonary problem include:  cough, chest pain, breathing difficulties or shortness of breath, dizziness, ankle swelling, uncontrolled high or unusually low blood pressure, and/or palpitations. If you are experiencing any of these symptoms, cancel your procedure and contact your primary care physician for an evaluation.  Remember:  Regular Business hours are:  Monday to Thursday 8:00 AM to 4:00 PM  Provider's Schedule: Eric Como, MD:  Procedure days: Tuesday and Thursday 7:30 AM to 4:00 PM  Wallie Sherry, MD:  Procedure days: Monday and Wednesday 7:30 AM to 4:00 PM Last  Updated: 07/09/2023 ______________________________________________________________________     ______________________________________________________________________    Blood Thinners  IMPORTANT NOTICE:  If you take any of these, make sure to notify the nursing staff.  Failure to do so may result in serious injury.  Recommended time intervals to stop and restart blood-thinners, before & after invasive procedures  Generic Name Brand Name Pre-procedure: Stop medication for this amount of time before your procedure: Post-procedure: Wait this  amount of time after the procedure before restarting your medication:  Abciximab Reopro 15 days 2 hrs  Alteplase Activase 10 days 10 days  Anagrelide Agrylin    Apixaban Eliquis 3 days 6 hrs  Cilostazol Pletal 3 days 5 hrs  Clopidogrel Plavix 7-10 days 2 hrs  Dabigatran Pradaxa 5 days 6 hrs  Dalteparin Fragmin 24 hours 4 hrs  Dipyridamole Aggrenox 11days 2 hrs  Edoxaban Lixiana; Savaysa 3 days 2 hrs  Enoxaparin   Lovenox  24 hours 4 hrs  Eptifibatide Integrillin 8 hours 2 hrs  Fondaparinux  Arixtra 72 hours 12 hrs  Hydroxychloroquine Plaquenil 11 days   Prasugrel Effient 7-10 days 6 hrs  Reteplase Retavase 10 days 10 days  Rivaroxaban Xarelto 3 days 6 hrs  Ticagrelor Brilinta 5-7 days 6 hrs  Ticlopidine Ticlid 10-14 days 2 hrs  Tinzaparin Innohep 24 hours 4 hrs  Tirofiban Aggrastat 8 hours 2  hrs  Warfarin Coumadin 5 days 2 hrs   Other medications with blood-thinning effects  NOTE: Consider stopping these if you have prolonged bleeding despite not taking any of the above blood thinners. Otherwise ask your provider and this will be decided on a case-by-case basis.  Product indications Generic (Brand) names Note  Cholesterol Lipitor Stop 4 days before procedure  Blood thinner (injectable) Heparin (LMW or LMWH Heparin) Stop 24 hours before procedure  Cancer Ibrutinib (Imbruvica) Stop 7 days before procedure  Malaria/Rheumatoid Hydroxychloroquine (Plaquenil) Stop 11 days before procedure  Thrombolytics  10 days before or after procedures   Over-the-counter (OTC) Products with blood-thinning effects  NOTE: Consider stopping these if you have prolonged bleeding despite not taking any of the above blood thinners. Otherwise ask your provider and this will be decided on a case-by-case basis.  Product Common names Stop Time  Aspirin  > 325 mg Goody Powders, Excedrin, etc. 11 days  Aspirin  <= 81 mg  7 days  Fish oil  4 days  Garlic supplements  7 days  Ginkgo biloba  36 hours  Ginseng  24 hours  NSAIDs Ibuprofen , Naprosyn, etc. 3 days  Vitamin E  4 days   ______________________________________________________________________

## 2024-08-05 ENCOUNTER — Encounter: Payer: Self-pay | Admitting: Student in an Organized Health Care Education/Training Program

## 2024-08-05 ENCOUNTER — Ambulatory Visit
Attending: Student in an Organized Health Care Education/Training Program | Admitting: Student in an Organized Health Care Education/Training Program

## 2024-08-05 VITALS — BP 131/87 | HR 96 | Temp 97.2°F | Resp 13 | Ht 61.0 in | Wt 154.0 lb

## 2024-08-05 DIAGNOSIS — R519 Headache, unspecified: Secondary | ICD-10-CM | POA: Insufficient documentation

## 2024-08-05 DIAGNOSIS — M5481 Occipital neuralgia: Secondary | ICD-10-CM | POA: Insufficient documentation

## 2024-08-05 DIAGNOSIS — M7918 Myalgia, other site: Secondary | ICD-10-CM | POA: Diagnosis not present

## 2024-08-05 MED ORDER — DEXAMETHASONE SOD PHOSPHATE PF 10 MG/ML IJ SOLN
INTRAMUSCULAR | Status: AC
  Filled 2024-08-05: qty 2

## 2024-08-05 MED ORDER — MIDAZOLAM HCL 2 MG/2ML IJ SOLN
INTRAMUSCULAR | Status: AC
  Filled 2024-08-05: qty 2

## 2024-08-05 MED ORDER — LIDOCAINE HCL (PF) 2 % IJ SOLN
INTRAMUSCULAR | Status: AC
  Filled 2024-08-05: qty 10

## 2024-08-05 MED ORDER — LACTATED RINGERS IV SOLN: Freq: Once | INTRAVENOUS | Status: AC

## 2024-08-05 MED ORDER — MIDAZOLAM HCL (PF) 2 MG/2ML IJ SOLN
0.5000 mg | Freq: Once | INTRAMUSCULAR | Status: AC
  Administered 2024-08-05: 2 mg via INTRAVENOUS

## 2024-08-05 MED ORDER — ROPIVACAINE HCL 2 MG/ML IJ SOLN
18.0000 mL | Freq: Once | INTRAMUSCULAR | Status: AC
  Administered 2024-08-05: 18 mL via PERINEURAL

## 2024-08-05 MED ORDER — ROPIVACAINE HCL 2 MG/ML IJ SOLN
INTRAMUSCULAR | Status: AC
  Filled 2024-08-05: qty 20

## 2024-08-05 MED ORDER — DEXAMETHASONE SOD PHOSPHATE PF 10 MG/ML IJ SOLN
20.0000 mg | Freq: Once | INTRAMUSCULAR | Status: AC
  Administered 2024-08-05: 20 mg

## 2024-08-05 MED ORDER — LIDOCAINE HCL 2 % IJ SOLN
20.0000 mL | Freq: Once | INTRAMUSCULAR | Status: AC
  Administered 2024-08-05: 100 mg

## 2024-08-05 NOTE — Progress Notes (Signed)
 PROVIDER NOTE: Interpretation of information contained herein should be left to medically-trained personnel. Specific patient instructions are provided elsewhere under Patient Instructions section of medical record. This document was created in part using STT-dictation technology, any transcriptional errors that may result from this process are unintentional.  Patient: Kimberly [name] Type: Established DOB: 05-Nov-1960 MRN: [id number] PCP: Alla Amis, MD  Service: Procedure DOS: 08/05/2024 Setting: Ambulatory Location: Ambulatory outpatient facility Delivery: Face-to-face Provider: Wallie Sherry, MD Specialty: Interventional Pain Management Specialty designation: 09 Location: Outpatient facility Ref. Prov.: Alla Amis, MD       Interventional Therapy   Primary Reason for Visit: Interventional Pain Management Treatment. CC: Neck Pain (Posterior ) and Headache    Procedure:          Anesthesia, Analgesia, Anxiolysis:  Type: Diagnostic, Greater, Occipital Nerve Block and bilateral cervical and trapezius trigger point injection           Region: Posterolateral Cervical Level: Occipital Ridge   Laterality: Bilateral  Anesthesia: Local (1-2% Lidocaine )  Anxiolysis: IV Versed  2 mg    Position: Prone   1. Bilateral occipital neuralgia   2. Cervical myofascial pain syndrome   3. Occipital headache    NAS-11 Pain score:   Pre-procedure: 7 /10   Post-procedure: 7 /10     H&P (Pre-op Assessment):  Kimberly [name] is a 64 y.o. (year old), female patient, seen today for interventional treatment. She  has a past surgical history that includes Abdominal hysterectomy; Rotator cuff repair (Bilateral, 2014); Foreign Body Removal (Left); Carpal tunnel release (Left, 02/16/2013); Carpal tunnel release (Right, 03/27/2013); Cesarean section; Anterior cervical decomp/discectomy fusion (N/A, 08/06/2014); Colonoscopy; Nasal sinus surgery (N/A, 04/13/2015); Shoulder arthroscopy with distal  clavicle resection (Left, 05/18/2015); Shoulder acromioplasty (Left, 05/18/2015); Cholecystectomy (N/A, 06/17/2015); Neck surgery (2016); Anterior cervical decomp/discectomy fusion (N/A, 07/04/2016); Posterior cervical fusion/foraminotomy (N/A, 11/13/2016); Knee arthroscopy; Extracorporeal shock wave lithotripsy (Left, 09/04/2018); Colonoscopy with propofol  (N/A, 05/06/2019); Reduction mammaplasty (10/2019); Esophagogastroduodenoscopy (N/A, 05/08/2021); cysto with hydrodistension (N/A, 09/26/2021); Cystoscopy w/ retrogrades (Bilateral, 09/26/2021); Cystoscopy with biopsy (N/A, 09/26/2021); Ureteroscopy (Bilateral, 09/26/2021); Esophagogastroduodenoscopy (11/25/2017); Xi robotic assisted oophorectomy (N/A, 12/22/2021); and Excision vaginal cyst (N/A, 12/22/2021). Ms. Lun has a current medication list which includes the following prescription(s): acetaminophen , amlodipine , bupropion, cholecalciferol , premarin , cyclobenzaprine , escitalopram , estradiol , fluconazole , gabapentin , hydroxyzine , ibuprofen , latanoprost, liothyronine , lumigan, meloxicam , uro-mp , methocarbamol , naloxone, NONFORMULARY OR COMPOUNDED ITEM, ondansetron , oxycodone , pantoprazole , phenazopyridine , rizatriptan, naphazoline-pheniramine, timolol, and valacyclovir , and the following Facility-Administered Medications: lactated ringers . Her primarily concern today is the Neck Pain (Posterior ) and Headache  Initial Vital Signs:  Pulse/HCG Rate: 96ECG Heart Rate: 83 Temp: (!) 97.2 F (36.2 C) Resp: 16 BP: 118/89 SpO2: 99 %  BMI: Estimated body mass index is 29.1 kg/m as calculated from the following:   Height as of this encounter: 5' 1 (1.549 m).   Weight as of this encounter: 154 lb (69.9 kg).  Risk Assessment: Allergies: Reviewed. She is allergic to shellfish allergy, peanut-containing drug products, codeine, diphtheria toxoid, and tetanus toxoid-containing vaccines.  Allergy Precautions: None required Coagulopathies: Reviewed. None  identified.  Blood-thinner therapy: None at this time Active Infection(s): Reviewed. None identified. Ms. Maita is afebrile  Site Confirmation: Ms. Brecheisen was asked to confirm the procedure and laterality before marking the site Procedure checklist: Completed Consent: Before the procedure and under the influence of no sedative(s), amnesic(s), or anxiolytics, the patient was informed of the treatment options, risks and possible complications. To fulfill our ethical and legal obligations, as recommended by the American Medical Association's Code of Ethics,  I have informed the patient of my clinical impression; the nature and purpose of the treatment or procedure; the risks, benefits, and possible complications of the intervention; the alternatives, including doing nothing; the risk(s) and benefit(s) of the alternative treatment(s) or procedure(s); and the risk(s) and benefit(s) of doing nothing. The patient was provided information about the general risks and possible complications associated with the procedure. These may include, but are not limited to: failure to achieve desired goals, infection, bleeding, organ or nerve damage, allergic reactions, paralysis, and death. In addition, the patient was informed of those risks and complications associated to the procedure, such as failure to decrease pain; infection; bleeding; organ or nerve damage with subsequent damage to sensory, motor, and/or autonomic systems, resulting in permanent pain, numbness, and/or weakness of one or several areas of the body; allergic reactions; (i.e.: anaphylactic reaction); and/or death. Furthermore, the patient was informed of those risks and complications associated with the medications. These include, but are not limited to: allergic reactions (i.e.: anaphylactic or anaphylactoid reaction(s)); adrenal axis suppression; blood sugar elevation that in diabetics may result in ketoacidosis or comma; water  retention that in  patients with history of congestive heart failure may result in shortness of breath, pulmonary edema, and decompensation with resultant heart failure; weight gain; swelling or edema; medication-induced neural toxicity; particulate matter embolism and blood vessel occlusion with resultant organ, and/or nervous system infarction; and/or aseptic necrosis of one or more joints. Finally, the patient was informed that Medicine is not an exact science; therefore, there is also the possibility of unforeseen or unpredictable risks and/or possible complications that may result in a catastrophic outcome. The patient indicated having understood very clearly. We have given the patient no guarantees and we have made no promises. Enough time was given to the patient to ask questions, all of which were answered to the patient's satisfaction. Ms. Farino has indicated that she wanted to continue with the procedure. Attestation: I, the ordering provider, attest that I have discussed with the patient the benefits, risks, side-effects, alternatives, likelihood of achieving goals, and potential problems during recovery for the procedure that I have provided informed consent. Date  Time: 08/05/2024 11:13 AM  Pre-Procedure Preparation:  Monitoring: As per clinic protocol. Respiration, ETCO2, SpO2, BP, heart rate and rhythm monitor placed and checked for adequate function Safety Precautions: Patient was assessed for positional comfort and pressure points before starting the procedure. Time-out: I initiated and conducted the Time-out before starting the procedure, as per protocol. The patient was asked to participate by confirming the accuracy of the Time Out information. Verification of the correct person, site, and procedure were performed and confirmed by me, the nursing staff, and the patient. Time-out conducted as per Joint Commission's Universal Protocol (UP.01.01.01). Time: 1150 Start Time: 1150 hrs.  Description of  Procedure:          Target Area: Area medial to the occipital artery at the level of the superior nuchal ridge Approach: Posterior approach Area Prepped: Entire Posterior Occipital Region ChloraPrep (2% chlorhexidine  gluconate and 70% isopropyl alcohol) Safety Precautions: Aspiration looking for blood return was conducted prior to all injections. At no point did we inject any substances, as a needle was being advanced. No attempts were made at seeking any paresthesias. Safe injection practices and needle disposal techniques used. Medications properly checked for expiration dates. SDV (single dose vial) medications used. Description of the Procedure: Protocol guidelines were followed. The target area was identified and the area prepped in the usual manner. Skin &  deeper tissues infiltrated with local anesthetic. Appropriate amount of time allowed to pass for local anesthetics to take effect. The procedure needles were then advanced to the target area. Proper needle placement secured. Negative aspiration confirmed. Solution injected in intermittent fashion, asking for systemic symptoms every 0.5cc of injectate. The needles were then removed and the area cleansed, making sure to leave some of the prepping solution back to take advantage of its long term bactericidal properties.  Vitals:   08/05/24 1119 08/05/24 1150 08/05/24 1153  BP: 118/89 (!) 150/107 (!) 169/110  Pulse: 96    Resp: 16 18 18   Temp: (!) 97.2 F (36.2 C)    TempSrc: Temporal    SpO2: 99% 97% 97%  Weight: 154 lb (69.9 kg)    Height: 5' 1 (1.549 m)      Start Time: 1150 hrs. End Time:   hrs. Materials:  Needle(s) Type: Regular needle Gauge: 22G Length: 1.5-in Medication(s): Please see orders for medications and dosing details. 6 cc solution made of 5 cc of 0.2% ropivacaine , 1 cc of Decadron  10 mg/cc.  Injected for the right greater occipital nerve 6 cc solution made of 5 cc of 0.2% ropivacaine , 1 cc of Decadron  10 mg/cc.   Injected for the left greater occipital nerve  Afterwards trigger point injections were done in the cervical region around her surgical scar, 1 cc of 0.2% ropivacaine  was injected    Anti-infectives (From admission, onward)    None      Indication(s): None identified  Post-operative Assessment:  Post-procedure Vital Signs:  Pulse/HCG Rate: 9688 Temp: (!) 97.2 F (36.2 C) Resp: 18 BP: (!) 169/110 SpO2: 97 %  EBL: None  Complications: No immediate post-treatment complications observed by team, or reported by patient.  Note: The patient tolerated the entire procedure well. A repeat set of vitals were taken after the procedure and the patient was kept under observation following institutional policy, for this type of procedure. Post-procedural neurological assessment was performed, showing return to baseline, prior to discharge. The patient was provided with post-procedure discharge instructions, including a section on how to identify potential problems. Should any problems arise concerning this procedure, the patient was given instructions to immediately contact us , at any time, without hesitation. In any case, we plan to contact the patient by telephone for a follow-up status report regarding this interventional procedure.  Comments:  No additional relevant information.  Plan of Care (POC)  Orders:  No orders of the defined types were placed in this encounter.   Oxycodone  5mg  every 6hours as needed, quantity 120/month; MME equals 30    Medications ordered for procedure: Meds ordered this encounter  Medications   lidocaine  (XYLOCAINE ) 2 % (with pres) injection 400 mg   lactated ringers  infusion   midazolam  PF (VERSED ) injection 0.5-2 mg    Make sure Flumazenil is available in the pyxis when using this medication. If oversedation occurs, administer 0.2 mg IV over 15 sec. If after 45 sec no response, administer 0.2 mg again over 1 min; may repeat at 1 min intervals; not to  exceed 4 doses (1 mg)   ropivacaine  (PF) 2 mg/mL (0.2%) (NAROPIN ) injection 18 mL   dexamethasone  (DECADRON ) injection 20 mg   Medications administered: We administered lidocaine , lactated ringers , midazolam  PF, ropivacaine  (PF) 2 mg/mL (0.2%), and dexamethasone .  See the medical record for exact dosing, route, and time of administration.   Follow-up plan:   Return in about 6 weeks (around 09/16/2024) for PPE, F2F (discuss occipital  PNS).     Recent Visits Date Type Provider Dept  08/03/24 Office Visit Patel, Seema K, NP [facility]  05/11/24 Office Visit Patel, Seema K, NP [facility]  Showing recent visits within past 90 days and meeting all other requirements Today's Visits Date Type Provider Dept  08/05/24 Procedure visit Marcelino Nurse, MD [facility]  Showing today's visits and meeting all other requirements Future Appointments Date Type Provider Dept  08/06/24 Appointment Marcelino Nurse, MD [facility]  Showing future appointments within next 90 days and meeting all other requirements   Disposition: Discharge home  Discharge (Date  Time): 08/05/2024;   hrs.   Primary Care Physician: Alla Amis, MD Location: [facility] Note by: Nurse Marcelino, MD (TTS technology used. I apologize for any typographical errors that were not detected and corrected.) Date: 08/05/2024; Time: 11:57 AM  Disclaimer:  Medicine is not an visual merchandiser. The only guarantee in medicine is that nothing is guaranteed. It is important to note that the decision to proceed with this intervention was based on the information collected from the patient. The Data and conclusions were drawn from the patient's questionnaire, the interview, and the physical examination. Because the information was provided in large part by the patient, it cannot be guaranteed that it has not been purposely or unconsciously manipulated. Every effort has been made  to obtain as much relevant data as possible for this evaluation. It is important to note that the conclusions that lead to this procedure are derived in large part from the available data. Always take into account that the treatment will also be dependent on availability of resources and existing treatment guidelines, considered by other Pain Management Practitioners as being common knowledge and practice, at the time of the intervention. For Medico-Legal purposes, it is also important to point out that variation in procedural techniques and pharmacological choices are the acceptable norm. The indications, contraindications, technique, and results of the above procedure should only be interpreted and judged by a Board-Certified Interventional Pain Specialist with extensive familiarity and expertise in the same exact procedure and technique.

## 2024-08-05 NOTE — Patient Instructions (Signed)

## 2024-08-06 ENCOUNTER — Telehealth: Payer: Self-pay

## 2024-08-06 ENCOUNTER — Ambulatory Visit: Admitting: Student in an Organized Health Care Education/Training Program

## 2024-08-06 NOTE — Telephone Encounter (Signed)
 Post procedure followup. Patient states she is doing a lot better.

## 2024-08-10 ENCOUNTER — Telehealth: Payer: Self-pay | Admitting: Student in an Organized Health Care Education/Training Program

## 2024-08-10 NOTE — Telephone Encounter (Signed)
 Says she is having a lot of pain since injections last Wed.

## 2024-08-10 NOTE — Telephone Encounter (Signed)
 Patient states that she is having the same kind of pain  since procedure.  Instructed patient that she needs to give the steroid time to start working. Instructed to give it 10 days and see how it does.  Instructed to document on pain diary and to call us  back for any further questions or concerns.

## 2024-08-17 ENCOUNTER — Telehealth: Payer: Self-pay | Admitting: Student in an Organized Health Care Education/Training Program

## 2024-08-17 NOTE — Telephone Encounter (Signed)
 I explained to patient that a facet block is a diagnostic procedure, she should discuss results at her follow up appt.  She denies any signs/symptoms of complication.

## 2024-08-17 NOTE — Telephone Encounter (Signed)
 Pt sated that the pain never went away after her procedure.

## 2024-08-17 NOTE — Telephone Encounter (Signed)
 Thank you :)

## 2024-08-20 ENCOUNTER — Encounter

## 2024-09-08 ENCOUNTER — Ambulatory Visit: Admitting: Student in an Organized Health Care Education/Training Program

## 2024-09-17 ENCOUNTER — Encounter
